# Patient Record
Sex: Male | Born: 1955 | Race: White | Hispanic: No | Marital: Married | Smoking: Never smoker
Health system: Southern US, Community
[De-identification: ages and names within clinical notes are randomized; demographics above are authoritative.]

## PROBLEM LIST (undated history)

## (undated) DIAGNOSIS — I639 Cerebral infarction, unspecified: Secondary | ICD-10-CM

## (undated) DIAGNOSIS — E039 Hypothyroidism, unspecified: Secondary | ICD-10-CM

## (undated) DIAGNOSIS — M1990 Unspecified osteoarthritis, unspecified site: Secondary | ICD-10-CM

## (undated) DIAGNOSIS — G4730 Sleep apnea, unspecified: Secondary | ICD-10-CM

## (undated) DIAGNOSIS — E119 Type 2 diabetes mellitus without complications: Secondary | ICD-10-CM

## (undated) DIAGNOSIS — E079 Disorder of thyroid, unspecified: Secondary | ICD-10-CM

## (undated) DIAGNOSIS — I10 Essential (primary) hypertension: Secondary | ICD-10-CM

## (undated) DIAGNOSIS — F419 Anxiety disorder, unspecified: Secondary | ICD-10-CM

## (undated) DIAGNOSIS — F32A Depression, unspecified: Secondary | ICD-10-CM

## (undated) DIAGNOSIS — R519 Headache, unspecified: Secondary | ICD-10-CM

## (undated) HISTORY — PX: GROIN DEBRIDEMENT: SHX5159

## (undated) HISTORY — PX: HERNIA REPAIR: SHX51

---

## 2013-03-03 ENCOUNTER — Emergency Department (HOSPITAL_COMMUNITY): Payer: BC Managed Care – PPO

## 2013-03-03 ENCOUNTER — Encounter (HOSPITAL_COMMUNITY): Payer: Self-pay | Admitting: Emergency Medicine

## 2013-03-03 ENCOUNTER — Emergency Department (HOSPITAL_COMMUNITY)
Admission: EM | Admit: 2013-03-03 | Discharge: 2013-03-03 | Disposition: A | Discharge status: Home / Self Care | Payer: BC Managed Care – PPO | Attending: Emergency Medicine | Admitting: Emergency Medicine

## 2013-03-03 DIAGNOSIS — R071 Chest pain on breathing: Secondary | ICD-10-CM | POA: Insufficient documentation

## 2013-03-03 DIAGNOSIS — E119 Type 2 diabetes mellitus without complications: Secondary | ICD-10-CM | POA: Insufficient documentation

## 2013-03-03 DIAGNOSIS — R0789 Other chest pain: Secondary | ICD-10-CM

## 2013-03-03 DIAGNOSIS — I10 Essential (primary) hypertension: Secondary | ICD-10-CM | POA: Insufficient documentation

## 2013-03-03 HISTORY — DX: Type 2 diabetes mellitus without complications: E11.9

## 2013-03-03 HISTORY — DX: Essential (primary) hypertension: I10

## 2013-03-03 HISTORY — DX: Disorder of thyroid, unspecified: E07.9

## 2013-03-03 LAB — BASIC METABOLIC PANEL
CO2: 25 mEq/L (ref 19–32)
Calcium: 10.1 mg/dL (ref 8.4–10.5)
Chloride: 97 mEq/L (ref 96–112)
GFR calc Af Amer: 74 mL/min — ABNORMAL LOW (ref >90)
GFR calc non Af Amer: 64 mL/min — ABNORMAL LOW (ref >90)
Glucose, Bld: 138 mg/dL — ABNORMAL HIGH (ref 70–99)
Potassium: 3.6 mEq/L (ref 3.5–5.1)
Sodium: 136 mEq/L (ref 135–145)

## 2013-03-03 LAB — PRO B NATRIURETIC PEPTIDE: Pro B Natriuretic peptide (BNP): 128.2 pg/mL — ABNORMAL HIGH (ref 0–125)

## 2013-03-03 LAB — CBC
MCH: 31.6 pg (ref 26.0–34.0)
MCHC: 35.2 g/dL (ref 30.0–36.0)
Platelets: 255 10*3/uL (ref 150–400)
RBC: 5.06 MIL/uL (ref 4.22–5.81)
RDW: 13.8 % (ref 11.5–15.5)

## 2013-03-03 LAB — POCT I-STAT TROPONIN I: Troponin i, poc: 0 ng/mL (ref 0.00–0.08)

## 2013-03-03 LAB — TROPONIN I: Troponin I: <0.3 ng/mL (ref <0.30)

## 2013-03-03 MED ORDER — LORAZEPAM 2 MG/ML IJ SOLN
1.0000 mg | Freq: Once | INTRAMUSCULAR | Status: AC
  Administered 2013-03-03: 1 mg via INTRAVENOUS
  Filled 2013-03-03: qty 1

## 2013-03-03 MED ORDER — KETOROLAC TROMETHAMINE 30 MG/ML IJ SOLN
30.0000 mg | Freq: Once | INTRAMUSCULAR | Status: AC
  Administered 2013-03-03: 30 mg via INTRAVENOUS
  Filled 2013-03-03: qty 1

## 2013-03-03 MED ORDER — MORPHINE SULFATE 4 MG/ML IJ SOLN
4.0000 mg | Freq: Once | INTRAMUSCULAR | Status: AC
  Administered 2013-03-03: 4 mg via INTRAVENOUS
  Filled 2013-03-03: qty 1

## 2013-03-03 MED ORDER — METHOCARBAMOL 750 MG PO TABS: 750.0000 mg | ORAL_TABLET | Freq: Four times a day (QID) | ORAL | Status: DC

## 2013-03-03 MED ORDER — OXYCODONE-ACETAMINOPHEN 5-325 MG PO TABS: 2.0000 | ORAL_TABLET | ORAL | Status: DC | PRN

## 2013-03-03 MED ORDER — IBUPROFEN 800 MG PO TABS: 800.0000 mg | ORAL_TABLET | Freq: Three times a day (TID) | ORAL | Status: DC

## 2013-03-03 NOTE — ED Notes (Signed)
Troponin results given to RN Diane

## 2013-03-03 NOTE — ED Notes (Signed)
Pt was work this morning about 0600 and started having sharp, crushing CP with radiation to lt shoulder/shoulder blade.  Diaphoretic, pt is crying.

## 2013-03-03 NOTE — ED Provider Notes (Signed)
CSN: [id number]     Arrival date & time 03/03/13  0745 History   First MD Initiated Contact with Patient 03/03/13 0801     Chief Complaint  Patient presents with  . Chest Pain   (Consider location/radiation/quality/duration/timing/severity/associated sxs/prior Treatment) Patient is a 57 y.o. male presenting with chest pain. The history is provided by the patient.  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 Pain characterized as sharp and worse with movement. Patient denies any associated diaphoresis or dyspnea. No history of coronary artery disease. No recent fever or cough. No leg pain or swelling. Took Motrin with some relief. Pain starts at the left side of his chest and radiates up into the left neck and left arm. No numbness or tingling to his arm. No prior history of same. Pain worse with movement better with rest  Past Medical History  Diagnosis Date  . Diabetes mellitus without complication   . Hypertension   . Thyroid disease    Past Surgical History  Procedure Laterality Date  . Hernia repair     History reviewed. No pertinent family history. History  Substance Use Topics  . Smoking status: Never Smoker   . Smokeless tobacco: Not on file  . Alcohol Use: No    Review of Systems  Cardiovascular: Positive for chest pain.  All other systems reviewed and are negative.    Allergies  Review of patient's allergies indicates no known allergies.  Home Medications  No current outpatient prescriptions on file. BP 174/77  Pulse 90  Temp(Src) 97.7 F (36.5 C) (Oral)  Resp 30  SpO2 100% Physical Exam  Nursing note and vitals reviewed. Constitutional: He is oriented to person, place, and time. He appears well-developed and well-nourished.  Non-toxic appearance. No distress.  HENT:  Head: Normocephalic and atraumatic.  Eyes: Conjunctivae, EOM and lids are normal. Pupils are equal, round, and reactive to  light.  Neck: Normal range of motion. Neck supple. No tracheal deviation present. No mass present.  Cardiovascular: Normal rate, regular rhythm and normal heart sounds.  Exam reveals no gallop.   No murmur heard. Pulmonary/Chest: Effort normal and breath sounds normal. No stridor. No respiratory distress. He has no decreased breath sounds. He has no wheezes. He has no rhonchi. He has no rales. He exhibits tenderness and bony tenderness. He exhibits no crepitus.    Abdominal: Soft. Normal appearance and bowel sounds are normal. He exhibits no distension. There is no tenderness. There is no rebound and no CVA tenderness.  Musculoskeletal: Normal range of motion. He exhibits no edema and no tenderness.  Neurological: He is alert and oriented to person, place, and time. He has normal strength. No cranial nerve deficit or sensory deficit. GCS eye subscore is 4. GCS verbal subscore is 5. GCS motor subscore is 6.  Skin: Skin is warm and dry. No abrasion and no rash noted.  Psychiatric: He has a normal mood and affect. His speech is normal and behavior is normal.    ED Course  Procedures (including critical care time) Labs Review Labs Reviewed  CBC  BASIC METABOLIC PANEL  PRO B NATRIURETIC PEPTIDE  TROPONIN I   Imaging Review No results found.  EKG Interpretation    Date/Time:  Tuesday March 03 2013 07:47:58 EST Ventricular Rate:  91 PR Interval:  185 QRS Duration: 105 QT Interval:  373 QTC Calculation: 459 R Axis:   99 Text Interpretation:  Sinus rhythm Probable  left atrial enlargement Borderline right axis deviation Probable anteroseptal infarct, old Baseline wander in lead(s) V3 Confirmed by Michai Dieppa  MD, Makinzey Banes ([id number]) on 03/03/2013 8:12:08 AM            MDM  No diagnosis found. Patient given Toradol, Ativan, morphine and feels better. Patient had exquisite chest wall pain which is improved. No concern for ACS. He is stable for discharge    Toy Baker, MD 03/03/13  [phone]

## 2013-06-13 ENCOUNTER — Encounter (HOSPITAL_COMMUNITY): Payer: Self-pay | Admitting: Emergency Medicine

## 2013-06-13 ENCOUNTER — Emergency Department (HOSPITAL_COMMUNITY)
Admission: EM | Admit: 2013-06-13 | Discharge: 2013-06-13 | Disposition: A | Discharge status: Home / Self Care | Payer: BC Managed Care – PPO | Attending: Emergency Medicine | Admitting: Emergency Medicine

## 2013-06-13 DIAGNOSIS — L03119 Cellulitis of unspecified part of limb: Principal | ICD-10-CM

## 2013-06-13 DIAGNOSIS — E119 Type 2 diabetes mellitus without complications: Secondary | ICD-10-CM | POA: Insufficient documentation

## 2013-06-13 DIAGNOSIS — Z79899 Other long term (current) drug therapy: Secondary | ICD-10-CM | POA: Insufficient documentation

## 2013-06-13 DIAGNOSIS — L03113 Cellulitis of right upper limb: Secondary | ICD-10-CM

## 2013-06-13 DIAGNOSIS — E079 Disorder of thyroid, unspecified: Secondary | ICD-10-CM | POA: Insufficient documentation

## 2013-06-13 DIAGNOSIS — Z7982 Long term (current) use of aspirin: Secondary | ICD-10-CM | POA: Insufficient documentation

## 2013-06-13 DIAGNOSIS — I10 Essential (primary) hypertension: Secondary | ICD-10-CM | POA: Insufficient documentation

## 2013-06-13 DIAGNOSIS — L02519 Cutaneous abscess of unspecified hand: Secondary | ICD-10-CM | POA: Insufficient documentation

## 2013-06-13 MED ORDER — HYDROCODONE-ACETAMINOPHEN 5-325 MG PO TABS
1.0000 | ORAL_TABLET | Freq: Once | ORAL | Status: AC
  Administered 2013-06-13: 1 via ORAL
  Filled 2013-06-13: qty 1

## 2013-06-13 MED ORDER — CLINDAMYCIN HCL 300 MG PO CAPS: 300.0000 mg | ORAL_CAPSULE | Freq: Four times a day (QID) | ORAL | Status: DC

## 2013-06-13 MED ORDER — CLINDAMYCIN HCL 300 MG PO CAPS
300.0000 mg | ORAL_CAPSULE | Freq: Once | ORAL | Status: AC
  Administered 2013-06-13: 300 mg via ORAL
  Filled 2013-06-13: qty 1

## 2013-06-13 NOTE — ED Provider Notes (Signed)
CSN: [id number]     Arrival date & time 06/13/13  2252 History  This chart was scribed for non-physician practitioner Ivonne AndrewPeter Mekenzie Modeste, PA working with Derwood KaplanAnkit Nanavati, MD by Elveria Risingimelie Horne, ED Scribe. This patient was seen in room WTR5/WTR5 and the patient's care was started at 11:30 PM.   Chief Complaint  Patient presents with  . Hand Pain     The history is provided by the patient. No language interpreter was used.   HPI Comments: Joseph Ortega is a 58 y.o. male with history of HTN and diabetes who presents to the Emergency Department complaining of throbbing right hand pain, onset one day. Patient reports that yesterday he woke up around 4pm and his right fifth finger and hand was red and swollen. Tonight patient reports that his pain has worsened and it extends up the arm into the elbow and shoulder. Patient also reports weakness in the fifth finger and is unable to straighten it. He does have a prior history of fracture to the finger which resulted in reduced extension however today it is worsened. Patient denies causative trauma or injury. Patient denies history of gout or joint inflammatory disease. Denies any fever, chills or sweats.  Patient is taking insulin for diabetes and checks his sugar regularly.   Past Medical History  Diagnosis Date  . Diabetes mellitus without complication   . Hypertension   . Thyroid disease    Past Surgical History  Procedure Laterality Date  . Hernia repair     No family history on file. History  Substance Use Topics  . Smoking status: Never Smoker   . Smokeless tobacco: Not on file  . Alcohol Use: No    Review of Systems  Constitutional: Negative for fever, chills and diaphoresis.      Allergies  Review of patient's allergies indicates no known allergies.  Home Medications   Current Outpatient Rx  Name  Route  Sig  Dispense  Refill  . amLODipine (NORVASC) 10 MG tablet   Oral   Take 10 mg by mouth daily.         Marland Kitchen. aspirin 81 MG  tablet   Oral   Take 81 mg by mouth daily.         . Exenatide (BYDUREON )   Subcutaneous   Inject 1 each into the skin once a week. Sunday.Info from Pharmacy: Bydureon use weekly as directed.         Marland Kitchen. glipiZIDE (GLUCOTROL XL) 10 MG 24 hr tablet   Oral   Take 10 mg by mouth daily with breakfast.         . ibuprofen (ADVIL,MOTRIN) 800 MG tablet   Oral   Take 1 tablet (800 mg total) by mouth 3 (three) times daily.   21 tablet   0   . insulin detemir (LEVEMIR) 100 UNIT/ML injection   Subcutaneous   Inject 54 Units into the skin daily.         Marland Kitchen. levothyroxine (SYNTHROID, LEVOTHROID) 125 MCG tablet   Oral   Take 125 mcg by mouth daily before breakfast.         . metFORMIN (GLUCOPHAGE) 500 MG tablet   Oral   Take 500 mg by mouth 2 (two) times daily with a meal. Note that 1000 mg bid is prescribed         . valsartan (DIOVAN) 320 MG tablet   Oral   Take 320 mg by mouth daily.         .Marland Kitchen  Vitamin D, Ergocalciferol, (DRISDOL) 50000 UNITS CAPS capsule   Oral   Take 50,000 Units by mouth every 7 (seven) days. sunday          Triage Vitals: BP 161/72  Pulse 77  Temp(Src) 98.6 F (37 C)  Resp 20  SpO2 97% Physical Exam  Nursing note and vitals reviewed. Constitutional: He is oriented to person, place, and time. He appears well-developed and well-nourished. No distress.  HENT:  Head: Normocephalic and atraumatic.  Eyes: EOM are normal.  Neck: Neck supple. No tracheal deviation present.  Cardiovascular: Normal rate.   Pulmonary/Chest: Effort normal. No respiratory distress.  Musculoskeletal: Normal range of motion.  There is diffuse erythema and swelling around the dorsal right hand especially the fifth MCP joint area. Small scab from abrasion or injury to the skin over the joint. There is no significant induration, nodularity or fluctuance. Reduced extension of the fifth finger secondary to pain and swelling. Normal distal sensations and capillary  refill.  There is no complete swelling of the fifth finger or sausage appearance. No significant pain with passive flexion or extension.  Neurological: He is alert and oriented to person, place, and time.  Skin: Skin is warm and dry.  Psychiatric: He has a normal mood and affect. His behavior is normal.    ED Course  Procedures  DIAGNOSTIC STUDIES: Oxygen Saturation is 97% on room air, adequate by my interpretation.    COORDINATION OF CARE: 11:37 PM- patient seen and evaluated. Patient appears well in no acute distress. There is a slight superficial small scab over the right fifth MCP joint. No nodularity or fluctuance. Diffuse erythema. Slightly reduced extension of the fifth finger from baseline. Patient with previous fractures to the finger. Pt advised of plan for treatment and pt agrees.  At this time do not suspect tenosynovitis. Will give prescription for clindamycin and orthopedic hand referral. Patient agrees with plan. Strict return precautions given.  MDM   Final diagnoses:  Cellulitis of right hand    I personally performed the services described in this documentation, which was scribed in my presence. The recorded information has been reviewed and is accurate.      Angus Seller, PA-C 06/13/13 [phone]

## 2013-06-13 NOTE — Discharge Instructions (Signed)
Keep your hand elevated to reduce pain and swelling. Take the antibiotics as prescribed and followup with a primary care provider or orthopedic hand specialist in 2 days. Return to the emergency department at any time for changing or worsening symptoms.    Cellulitis Cellulitis is an infection of the skin and the tissue beneath it. The infected area is usually red and tender. Cellulitis occurs most often in the arms and lower legs.  CAUSES  Cellulitis is caused by bacteria that enter the skin through cracks or cuts in the skin. The most common types of bacteria that cause cellulitis are Staphylococcus and Streptococcus. SYMPTOMS   Redness and warmth.  Swelling.  Tenderness or pain.  Fever. DIAGNOSIS  Your caregiver can usually determine what is wrong based on a physical exam. Blood tests may also be done. TREATMENT  Treatment usually involves taking an antibiotic medicine. HOME CARE INSTRUCTIONS   Take your antibiotics as directed. Finish them even if you start to feel better.  Keep the infected arm or leg elevated to reduce swelling.  Apply a warm cloth to the affected area up to 4 times per day to relieve pain.  Only take over-the-counter or prescription medicines for pain, discomfort, or fever as directed by your caregiver.  Keep all follow-up appointments as directed by your caregiver. SEEK MEDICAL CARE IF:   You notice red streaks coming from the infected area.  Your red area gets larger or turns dark in color.  Your bone or joint underneath the infected area becomes painful after the skin has healed.  Your infection returns in the same area or another area.  You notice a swollen bump in the infected area.  You develop new symptoms. SEEK IMMEDIATE MEDICAL CARE IF:   You have a fever.  You feel very sleepy.  You develop vomiting or diarrhea.  You have a general ill feeling (malaise) with muscle aches and pains. MAKE SURE YOU:   Understand these  instructions.  Will watch your condition.  Will get help right away if you are not doing well or get worse. Document Released: 12/13/2004 Document Revised: 09/04/2011 Document Reviewed: 05/21/2011 [facility] Patient Information 2014 [employer], [place].

## 2013-06-13 NOTE — ED Notes (Signed)
Pt states that yesterday evening he noticed a small area that looked as if something bite him. States hand all the way up to arm hurts. Pt has small area that is red on right pinky area.

## 2013-06-14 ENCOUNTER — Emergency Department (HOSPITAL_COMMUNITY)
Admission: EM | Admit: 2013-06-14 | Discharge: 2013-06-14 | Disposition: A | Discharge status: Home / Self Care | Payer: BC Managed Care – PPO | Attending: Emergency Medicine | Admitting: Emergency Medicine

## 2013-06-14 ENCOUNTER — Encounter (HOSPITAL_COMMUNITY): Payer: Self-pay | Admitting: Emergency Medicine

## 2013-06-14 DIAGNOSIS — L02519 Cutaneous abscess of unspecified hand: Secondary | ICD-10-CM | POA: Insufficient documentation

## 2013-06-14 DIAGNOSIS — I10 Essential (primary) hypertension: Secondary | ICD-10-CM | POA: Insufficient documentation

## 2013-06-14 DIAGNOSIS — L03019 Cellulitis of unspecified finger: Secondary | ICD-10-CM | POA: Insufficient documentation

## 2013-06-14 DIAGNOSIS — Z7982 Long term (current) use of aspirin: Secondary | ICD-10-CM | POA: Insufficient documentation

## 2013-06-14 DIAGNOSIS — E119 Type 2 diabetes mellitus without complications: Secondary | ICD-10-CM | POA: Insufficient documentation

## 2013-06-14 DIAGNOSIS — Z794 Long term (current) use of insulin: Secondary | ICD-10-CM | POA: Insufficient documentation

## 2013-06-14 DIAGNOSIS — E079 Disorder of thyroid, unspecified: Secondary | ICD-10-CM | POA: Insufficient documentation

## 2013-06-14 DIAGNOSIS — Z791 Long term (current) use of non-steroidal anti-inflammatories (NSAID): Secondary | ICD-10-CM | POA: Insufficient documentation

## 2013-06-14 DIAGNOSIS — Z79899 Other long term (current) drug therapy: Secondary | ICD-10-CM | POA: Insufficient documentation

## 2013-06-14 LAB — CBC WITH DIFFERENTIAL/PLATELET
BASOS PCT: 0 % (ref 0–1)
Basophils Absolute: 0 10*3/uL (ref 0.0–0.1)
Eosinophils Absolute: 0.1 10*3/uL (ref 0.0–0.7)
Eosinophils Relative: 2 % (ref 0–5)
HEMATOCRIT: 41.1 % (ref 39.0–52.0)
HEMOGLOBIN: 14.1 g/dL (ref 13.0–17.0)
LYMPHS ABS: 1.6 10*3/uL (ref 0.7–4.0)
Lymphocytes Relative: 28 % (ref 12–46)
MCH: 30.9 pg (ref 26.0–34.0)
MCHC: 34.3 g/dL (ref 30.0–36.0)
MCV: 89.9 fL (ref 78.0–100.0)
MONO ABS: 0.5 10*3/uL (ref 0.1–1.0)
MONOS PCT: 8 % (ref 3–12)
NEUTROS ABS: 3.6 10*3/uL (ref 1.7–7.7)
Neutrophils Relative %: 62 % (ref 43–77)
Platelets: 182 10*3/uL (ref 150–400)
RBC: 4.57 MIL/uL (ref 4.22–5.81)
RDW: 14 % (ref 11.5–15.5)
WBC: 5.8 10*3/uL (ref 4.0–10.5)

## 2013-06-14 LAB — URIC ACID: Uric Acid, Serum: 5.4 mg/dL (ref 4.0–7.8)

## 2013-06-14 LAB — BASIC METABOLIC PANEL
BUN: 19 mg/dL (ref 6–23)
CHLORIDE: 102 meq/L (ref 96–112)
CO2: 29 meq/L (ref 19–32)
Calcium: 9.5 mg/dL (ref 8.4–10.5)
Creatinine, Ser: 0.82 mg/dL (ref 0.50–1.35)
GFR calc non Af Amer: >90 mL/min (ref >90)
Glucose, Bld: 195 mg/dL — ABNORMAL HIGH (ref 70–99)
POTASSIUM: 4.2 meq/L (ref 3.7–5.3)
Sodium: 140 mEq/L (ref 137–147)

## 2013-06-14 NOTE — ED Provider Notes (Signed)
CSN: [id number]     Arrival date & time 06/14/13  1122 History   First MD Initiated Contact with Patient 06/14/13 1132     Chief Complaint  Patient presents with  . Labs Only     (Consider location/radiation/quality/duration/timing/severity/associated sxs/prior Treatment) HPI  Patient presents to the emergency department as requested by Dr. Caprice Red MD for a lab draw. Patient was seen last night with cellulitis to the right fifth digit. It was noted that he had some swelling, erythema and slight decrease of extension of the finger. He was written for a prescription of clindamycin and told to follow back with the hand doctor on Monday. He is taken at least 4 doses of the clindamycin and denies his hand swelling being any worse.  Past Medical History  Diagnosis Date  . Diabetes mellitus without complication   . Hypertension   . Thyroid disease    Past Surgical History  Procedure Laterality Date  . Hernia repair     History reviewed. No pertinent family history. History  Substance Use Topics  . Smoking status: Never Smoker   . Smokeless tobacco: Not on file  . Alcohol Use: No    Review of Systems  The patient denies anorexia, fever, weight loss,, vision loss, decreased hearing, hoarseness, chest pain, syncope, dyspnea on exertion, peripheral edema, balance deficits, hemoptysis, abdominal pain, melena, hematochezia, severe indigestion/heartburn, hematuria, incontinence, genital sores, muscle weakness, suspicious skin lesions, transient blindness, difficulty walking, depression, unusual weight change, abnormal bleeding, enlarged lymph nodes, angioedema, and breast masses.   Allergies  Review of patient's allergies indicates no known allergies.  Home Medications   Current Outpatient Rx  Name  Route  Sig  Dispense  Refill  . amLODipine (NORVASC) 10 MG tablet   Oral   Take 10 mg by mouth daily.         Marland Kitchen aspirin 81 MG tablet   Oral   Take 81 mg by mouth daily.          . clindamycin (CLEOCIN) 300 MG capsule   Oral   Take 1 capsule (300 mg total) by mouth 4 (four) times daily. X 7 days   28 capsule   0   . Exenatide (BYDUREON [place])   Subcutaneous   Inject 1 each into the skin once a week. Sunday.Info from Pharmacy: Bydureon use weekly as directed.         Marland Kitchen glipiZIDE (GLUCOTROL XL) 10 MG 24 hr tablet   Oral   Take 10 mg by mouth daily with breakfast.         . ibuprofen (ADVIL,MOTRIN) 800 MG tablet   Oral   Take 1 tablet (800 mg total) by mouth 3 (three) times daily.   21 tablet   0   . insulin detemir (LEVEMIR) 100 UNIT/ML injection   Subcutaneous   Inject 54 Units into the skin daily.         Marland Kitchen levothyroxine (SYNTHROID, LEVOTHROID) 125 MCG tablet   Oral   Take 125 mcg by mouth daily before breakfast.         . metFORMIN (GLUCOPHAGE) 500 MG tablet   Oral   Take 500 mg by mouth 2 (two) times daily with a meal. Note that 1000 mg bid is prescribed         . valsartan (DIOVAN) 320 MG tablet   Oral   Take 320 mg by mouth daily.         . Vitamin D, Ergocalciferol, (DRISDOL) 50000 UNITS CAPS  capsule   Oral   Take 50,000 Units by mouth every 7 (seven) days. sunday          BP 172/92  Pulse 72  Temp(Src) 97.9 F (36.6 C)  Resp 18  SpO2 100% Physical Exam  Nursing note and vitals reviewed. Constitutional: He appears well-developed and well-nourished. No distress.  HENT:  Head: Normocephalic and atraumatic.  Eyes: Pupils are equal, round, and reactive to light.  Neck: Normal range of motion. Neck supple.  Cardiovascular: Normal rate and regular rhythm.   Pulmonary/Chest: Effort normal.  Abdominal: Soft.  Musculoskeletal:  Patient has mild swelling to the MCP joints with a small amount of cellulitis surrounding a small scab. His finger is held in slight flexion and and he is unable to extend it with effort. He does not have pain with extension of the finger of passive range of motion. He does have pain to pressure distal  to the wrist and into the forearm without streaking.  Neurological: He is alert.  Skin: Skin is warm and dry.    ED Course  Procedures (including critical care time) Labs Review Labs Reviewed  BASIC METABOLIC PANEL - Abnormal; Notable for the following:    Glucose, Bld 195 (*)    All other components within normal limits  CBC WITH DIFFERENTIAL  URIC ACID   Imaging Review No results found.   EKG Interpretation None      MDM   Final diagnoses:  Cellulitis of finger    Uric acid and cbc, bmp ordered as recommended by Dr. Caprice RedWiengold. Will call with blood results  Discussed results with Dr. Mina MarbleWeingold- pts labs are WNL, he does not appear to have tenosynovitis at this time. Dr. Mina MarbleWeingold would like patient to see him in the office tomorrow. Pt has been instructed to call in the morning to arrange follow-up  Continue clindamycin  57 y.o.Joseph Ortega's evaluation in the Emergency Department is complete. It has been determined that no acute conditions requiring further emergency intervention are present at this time. The patient/guardian have been advised of the diagnosis and plan. We have discussed signs and symptoms that warrant return to the ED, such as changes or worsening in symptoms.  Vital signs are stable at discharge. Filed Vitals:   06/14/13 1157  BP: 172/92  Pulse: 72  Temp: 97.9 F (36.6 C)  Resp: 18    Patient/guardian has voiced understanding and agreed to follow-up with the PCP or specialist.   Dorthula Matasiffany G Jayshaun Phillips, PA-C 06/14/13 1252

## 2013-06-14 NOTE — ED Provider Notes (Signed)
Medical screening examination/treatment/procedure(s) were performed by non-physician practitioner and as supervising physician I was immediately available for consultation/collaboration.  Toy BakerAnthony T Nicolle Heward, MD 06/14/13 [phone]

## 2013-06-14 NOTE — Discharge Instructions (Signed)
Cellulitis Cellulitis is an infection of the skin and the tissue beneath it. The infected area is usually red and tender. Cellulitis occurs most often in the arms and lower legs.  CAUSES  Cellulitis is caused by bacteria that enter the skin through cracks or cuts in the skin. The most common types of bacteria that cause cellulitis are Staphylococcus and Streptococcus. SYMPTOMS   Redness and warmth.  Swelling.  Tenderness or pain.  Fever. DIAGNOSIS  Your caregiver can usually determine what is wrong based on a physical exam. Blood tests may also be done. TREATMENT  Treatment usually involves taking an antibiotic medicine. HOME CARE INSTRUCTIONS   Take your antibiotics as directed. Finish them even if you start to feel better.  Keep the infected arm or leg elevated to reduce swelling.  Apply a warm cloth to the affected area up to 4 times per day to relieve pain.  Only take over-the-counter or prescription medicines for pain, discomfort, or fever as directed by your caregiver.  Keep all follow-up appointments as directed by your caregiver. SEEK MEDICAL CARE IF:   You notice red streaks coming from the infected area.  Your red area gets larger or turns dark in color.  Your bone or joint underneath the infected area becomes painful after the skin has healed.  Your infection returns in the same area or another area.  You notice a swollen bump in the infected area.  You develop new symptoms. SEEK IMMEDIATE MEDICAL CARE IF:   You have a fever.  You feel very sleepy.  You develop vomiting or diarrhea.  You have a general ill feeling (malaise) with muscle aches and pains. MAKE SURE YOU:   Understand these instructions.  Will watch your condition.  Will get help right away if you are not doing well or get worse. Document Released: 12/13/2004 Document Revised: 09/04/2011 Document Reviewed: 05/21/2011 [employer] Patient Information 2014 [employer].  

## 2013-06-14 NOTE — ED Provider Notes (Signed)
Medical screening examination/treatment/procedure(s) were performed by non-physician practitioner and as supervising physician I was immediately available for consultation/collaboration.   EKG Interpretation None       Derwood KaplanAnkit Yoltzin Ransom, MD 06/14/13 [phone]

## 2013-06-14 NOTE — ED Notes (Signed)
Notified pt to return for additional blood work at a request of Dr Caprice RedWiengold. Pt in agreement and is returning.

## 2013-08-05 ENCOUNTER — Other Ambulatory Visit: Payer: Self-pay | Admitting: Occupational Medicine

## 2013-08-05 ENCOUNTER — Ambulatory Visit: Payer: Self-pay

## 2013-08-05 DIAGNOSIS — Z0000 Encounter for general adult medical examination without abnormal findings: Secondary | ICD-10-CM

## 2016-01-02 ENCOUNTER — Other Ambulatory Visit: Payer: Self-pay | Admitting: Occupational Medicine

## 2016-01-02 ENCOUNTER — Ambulatory Visit: Payer: Self-pay

## 2016-01-02 DIAGNOSIS — Z0000 Encounter for general adult medical examination without abnormal findings: Secondary | ICD-10-CM

## 2017-07-15 ENCOUNTER — Ambulatory Visit
Admission: RE | Admit: 2017-07-15 | Discharge: 2017-07-15 | Disposition: A | Discharge status: Home / Self Care | Payer: Self-pay | Source: Ambulatory Visit | Attending: Pain Medicine | Admitting: Pain Medicine

## 2017-07-15 ENCOUNTER — Other Ambulatory Visit: Payer: Self-pay | Admitting: Pain Medicine

## 2017-07-15 DIAGNOSIS — M542 Cervicalgia: Secondary | ICD-10-CM

## 2017-08-26 ENCOUNTER — Other Ambulatory Visit: Payer: Self-pay | Admitting: Pain Medicine

## 2017-08-26 DIAGNOSIS — M25562 Pain in left knee: Secondary | ICD-10-CM

## 2017-08-26 DIAGNOSIS — M25519 Pain in unspecified shoulder: Secondary | ICD-10-CM

## 2017-08-26 DIAGNOSIS — M542 Cervicalgia: Secondary | ICD-10-CM

## 2017-08-30 ENCOUNTER — Ambulatory Visit
Admission: RE | Admit: 2017-08-30 | Discharge: 2017-08-30 | Disposition: A | Discharge status: Home / Self Care | Payer: BLUE CROSS/BLUE SHIELD | Source: Ambulatory Visit | Attending: Pain Medicine | Admitting: Pain Medicine

## 2017-08-30 DIAGNOSIS — M25519 Pain in unspecified shoulder: Secondary | ICD-10-CM

## 2017-08-30 DIAGNOSIS — M25562 Pain in left knee: Secondary | ICD-10-CM

## 2017-08-30 DIAGNOSIS — M542 Cervicalgia: Secondary | ICD-10-CM

## 2018-09-30 ENCOUNTER — Telehealth: Payer: Self-pay | Admitting: *Deleted

## 2018-09-30 DIAGNOSIS — Z20822 Contact with and (suspected) exposure to covid-19: Secondary | ICD-10-CM

## 2018-09-30 NOTE — Addendum Note (Signed)
Addended by: Linus Orn A on: 09/30/2018 03:34 PM   Modules accepted: Orders

## 2018-09-30 NOTE — Telephone Encounter (Signed)
Pt. Called back and scheduled for tomorrow. 

## 2018-09-30 NOTE — Telephone Encounter (Signed)
Pt referred for testing by Charletta Cousin. Reason for testing: Exposure Insurance: [employer]  Pt called but no answer at this time. Left message on voicemail to return call to schedule testing.

## 2018-10-01 ENCOUNTER — Other Ambulatory Visit: Payer: Self-pay | Admitting: *Deleted

## 2018-10-01 ENCOUNTER — Other Ambulatory Visit: Payer: Self-pay

## 2018-10-01 DIAGNOSIS — Z20822 Contact with and (suspected) exposure to covid-19: Secondary | ICD-10-CM

## 2018-10-05 LAB — NOVEL CORONAVIRUS, NAA: SARS-CoV-2, NAA: NOT DETECTED

## 2019-02-25 ENCOUNTER — Emergency Department (HOSPITAL_COMMUNITY): Payer: BC Managed Care – PPO

## 2019-02-25 ENCOUNTER — Other Ambulatory Visit: Payer: Self-pay

## 2019-02-25 ENCOUNTER — Encounter (HOSPITAL_COMMUNITY): Payer: Self-pay | Admitting: Emergency Medicine

## 2019-02-25 ENCOUNTER — Emergency Department (HOSPITAL_COMMUNITY)
Admission: EM | Admit: 2019-02-25 | Discharge: 2019-02-25 | Disposition: A | Discharge status: Home / Self Care | Payer: BC Managed Care – PPO | Attending: Emergency Medicine | Admitting: Emergency Medicine

## 2019-02-25 DIAGNOSIS — Z7982 Long term (current) use of aspirin: Secondary | ICD-10-CM | POA: Insufficient documentation

## 2019-02-25 DIAGNOSIS — S92354A Nondisplaced fracture of fifth metatarsal bone, right foot, initial encounter for closed fracture: Secondary | ICD-10-CM | POA: Diagnosis not present

## 2019-02-25 DIAGNOSIS — Y9389 Activity, other specified: Secondary | ICD-10-CM | POA: Insufficient documentation

## 2019-02-25 DIAGNOSIS — E119 Type 2 diabetes mellitus without complications: Secondary | ICD-10-CM | POA: Diagnosis not present

## 2019-02-25 DIAGNOSIS — I10 Essential (primary) hypertension: Secondary | ICD-10-CM | POA: Insufficient documentation

## 2019-02-25 DIAGNOSIS — Z794 Long term (current) use of insulin: Secondary | ICD-10-CM | POA: Insufficient documentation

## 2019-02-25 DIAGNOSIS — Y990 Civilian activity done for income or pay: Secondary | ICD-10-CM | POA: Insufficient documentation

## 2019-02-25 DIAGNOSIS — Y929 Unspecified place or not applicable: Secondary | ICD-10-CM | POA: Insufficient documentation

## 2019-02-25 DIAGNOSIS — Z79899 Other long term (current) drug therapy: Secondary | ICD-10-CM | POA: Insufficient documentation

## 2019-02-25 DIAGNOSIS — S99921A Unspecified injury of right foot, initial encounter: Secondary | ICD-10-CM | POA: Diagnosis present

## 2019-02-25 DIAGNOSIS — X501XXA Overexertion from prolonged static or awkward postures, initial encounter: Secondary | ICD-10-CM | POA: Insufficient documentation

## 2019-02-25 MED ORDER — OXYCODONE-ACETAMINOPHEN 5-325 MG PO TABS
1.0000 | ORAL_TABLET | Freq: Once | ORAL | Status: AC
  Administered 2019-02-25: 1 via ORAL
  Filled 2019-02-25: qty 1

## 2019-02-25 NOTE — Discharge Instructions (Signed)
Please read instructions below. Keep the splint clean, dry and in place at all times. Elevate your leg as much as possible. You can take your prescribed pain medications as directed for pain. Call the orthopedic specialist office the next business day to schedule an appointment for repeat xray and follow up on your injury. Return to the ED for concerning symptoms.

## 2019-02-25 NOTE — Progress Notes (Signed)
Orthopedic Tech Progress Note Patient Details:  Joseph Ortega Nov 17, 1955 [id number]  Ortho Devices Type of Ortho Device: Ace wrap, Stirrup splint, Short leg splint Ortho Device/Splint Location: right Ortho Device/Splint Interventions: Application   Post Interventions Patient Tolerated: Well Instructions Provided: Care of device   [place] Pink 02/25/2019, 7:39 PM

## 2019-02-25 NOTE — ED Triage Notes (Signed)
C/o R foot pain that started around noon today.  States he was coming down steps at work and stepped on concrete floor and heard a pop.

## 2019-02-25 NOTE — ED Provider Notes (Signed)
[facility] EMERGENCY DEPARTMENT Provider Note   CSN: [id number] Arrival date & time: 02/25/19  1557     History   Chief Complaint Chief Complaint  Patient presents with  . Foot Pain    HPI Joseph Ortega is a 63 y.o. male with past medical history of DM, hypertension, presenting the emergency department with sudden onset of right foot pain after injury today.  Patient states he was walking on the steps and when he stepped down off the last step he pivoted and heard a "pop" in his foot.  He has pain to the lateral aspect of his foot and ankle that wraps to the plantar aspect of his foot.  Pain is worse with weightbearing and movement.  He is prescribed chronic pain medication which he is taking as prescribed.  No previous injuries to the foot.  No other injuries reported.     The history is provided by the patient.    Past Medical History:  Diagnosis Date  . Diabetes mellitus without complication ([place])   . Hypertension   . Thyroid disease     There are no active problems to display for this patient.   Past Surgical History:  Procedure Laterality Date  . HERNIA REPAIR          Home Medications    Prior to Admission medications   Medication Sig Start Date End Date Taking? Authorizing Provider  amLODipine (NORVASC) 10 MG tablet Take 10 mg by mouth daily.    [provider]  aspirin 81 MG tablet Take 81 mg by mouth daily.    [provider]  clindamycin (CLEOCIN) 300 MG capsule Take 1 capsule (300 mg total) by mouth 4 (four) times daily. X 7 days 06/13/13   Hazel Sams, PA-C  Exenatide (BYDUREON [place]) Inject 1 each into the skin once a week. Sunday.Info from Pharmacy: Bydureon use weekly as directed.    [provider]  glipiZIDE (GLUCOTROL XL) 10 MG 24 hr tablet Take 10 mg by mouth daily with breakfast.    [provider]  ibuprofen (ADVIL,MOTRIN) 800 MG tablet Take 1 tablet (800 mg total) by mouth 3 (three) times  daily. 03/03/13   Lacretia Leigh, MD  insulin detemir (LEVEMIR) 100 UNIT/ML injection Inject 54 Units into the skin daily.    [provider]  levothyroxine (SYNTHROID, LEVOTHROID) 125 MCG tablet Take 125 mcg by mouth daily before breakfast.    [provider]  metFORMIN (GLUCOPHAGE) 500 MG tablet Take 500 mg by mouth 2 (two) times daily with a meal. Note that 1000 mg bid is prescribed    [provider]  valsartan (DIOVAN) 320 MG tablet Take 320 mg by mouth daily.    [provider]  Vitamin D, Ergocalciferol, (DRISDOL) 50000 UNITS CAPS capsule Take 50,000 Units by mouth every 7 (seven) days. sunday    [provider]    Family History No family history on file.  Social History Social History   Tobacco Use  . Smoking status: Never Smoker  . Smokeless tobacco: Never Used  Substance Use Topics  . Alcohol use: No  . Drug use: No     Allergies   Patient has no known allergies.   Review of Systems Review of Systems  Musculoskeletal: Positive for arthralgias and myalgias.  Skin: Negative for wound.     Physical Exam Updated Vital Signs BP (!) 177/80 (BP Location: Left Arm)   Pulse 77   Temp 98.7 F (37.1 C) (  Oral)   Resp 16   SpO2 97%   Physical Exam Vitals signs and nursing note reviewed.  Constitutional:      Appearance: He is well-developed.  HENT:     Head: Normocephalic and atraumatic.  Eyes:     Conjunctiva/sclera: Conjunctivae normal.  Cardiovascular:     Rate and Rhythm: Normal rate.  Pulmonary:     Effort: Pulmonary effort is normal.  Musculoskeletal:     Comments: B/l pedal and LE edema. Right foot w TTP over the lateral metatarsals and just inferior to the lateral malleolus. Normal ROM however active dorsiflexion causes pain. Small bruise to the middle of the plantar aspect of the foot. No wounds.  Neurological:     Mental Status: He is alert.  Psychiatric:        Mood and Affect: Mood normal.         Behavior: Behavior normal.      ED Treatments / Results  Labs (all labs ordered are listed, but only abnormal results are displayed) Labs Reviewed - No data to display  EKG None  Radiology Dg Ankle Complete Right  Result Date: 02/25/2019 CLINICAL DATA:  Foot pain.  Injury EXAM: RIGHT ANKLE - COMPLETE 3+ VIEW COMPARISON:  None. FINDINGS: Nondisplaced fracture proximal fifth metatarsal. Negative for ankle fracture.  Normal ankle joint space. Arterial calcification IMPRESSION: Nondisplaced fracture proximal fifth metatarsal. Electronically Signed   By: Marlan Palau M.D.   On: 02/25/2019 17:51   Dg Foot Complete Right  Result Date: 02/25/2019 CLINICAL DATA:  Foot pain, injury EXAM: RIGHT FOOT COMPLETE - 3+ VIEW COMPARISON:  None. FINDINGS: Transverse fracture of the proximal fifth metatarsal without displacement. Chronic fracture of the mid second metatarsal. No erosion. Arterial calcification IMPRESSION: Nondisplaced fracture proximal fifth metatarsal. Electronically Signed   By: Marlan Palau M.D.   On: 02/25/2019 17:49    Procedures Procedures (including critical care time)  Medications Ordered in ED Medications  oxyCODONE-acetaminophen (PERCOCET/ROXICET) 5-325 MG per tablet 1 tablet (has no administration in time range)     Initial Impression / Assessment and Plan / ED Course  I have reviewed the triage vital signs and the nursing notes.  Pertinent labs & imaging results that were available during my care of the patient were reviewed by me and considered in my medical decision making (see chart for details).        Pt with acute closed nondisplaced jones fracture after stepping off a step and pivoting wrong. NV intact. No acute distress. Pain treated. Placed in short leg splint, crutches, and provided ortho follow up. Pt prescribed tramadol, gabapentin and meloxicam for chronic pain. Encouraged he continue taking as directed to symptoms management. Safe for discharge.   Discussed results, findings, treatment and follow up. Patient advised of return precautions. Patient verbalized understanding and agreed with plan.  Final Clinical Impressions(s) / ED Diagnoses   Final diagnoses:  Closed nondisplaced fracture of fifth metatarsal bone of right foot, initial encounter    ED Discharge Orders    None       Robinson, [place] N, PA-C 02/25/19 1820    Virgina Norfolk, DO 02/25/19 1929

## 2019-02-25 NOTE — ED Notes (Signed)
Pt discharge instructions reviewed with patient. Pt verbalized understanding. Pt discharged. 

## 2019-10-13 ENCOUNTER — Encounter (HOSPITAL_COMMUNITY): Payer: Self-pay | Admitting: Emergency Medicine

## 2019-10-13 ENCOUNTER — Emergency Department (HOSPITAL_COMMUNITY): Payer: BC Managed Care – PPO

## 2019-10-13 ENCOUNTER — Other Ambulatory Visit: Payer: Self-pay

## 2019-10-13 ENCOUNTER — Emergency Department (HOSPITAL_COMMUNITY)
Admission: EM | Admit: 2019-10-13 | Discharge: 2019-10-14 | Disposition: A | Discharge status: Home / Self Care | Payer: BC Managed Care – PPO | Attending: Emergency Medicine | Admitting: Emergency Medicine

## 2019-10-13 DIAGNOSIS — I10 Essential (primary) hypertension: Secondary | ICD-10-CM | POA: Diagnosis not present

## 2019-10-13 DIAGNOSIS — U071 COVID-19: Secondary | ICD-10-CM | POA: Insufficient documentation

## 2019-10-13 DIAGNOSIS — E119 Type 2 diabetes mellitus without complications: Secondary | ICD-10-CM | POA: Diagnosis not present

## 2019-10-13 DIAGNOSIS — R509 Fever, unspecified: Secondary | ICD-10-CM | POA: Diagnosis present

## 2019-10-13 NOTE — ED Triage Notes (Signed)
Pt arrives to ED with c/o of testing covid + on 7/15 and now having  Sob. Pt also reports fever and night sweats.

## 2019-10-14 LAB — CBC WITH DIFFERENTIAL/PLATELET
Abs Immature Granulocytes: 0 10*3/uL (ref 0.00–0.07)
Basophils Absolute: 0 10*3/uL (ref 0.0–0.1)
Basophils Relative: 0 %
Eosinophils Absolute: 0 10*3/uL (ref 0.0–0.5)
Eosinophils Relative: 0 %
HCT: 41.6 % (ref 39.0–52.0)
Hemoglobin: 13.8 g/dL (ref 13.0–17.0)
Lymphocytes Relative: 17 %
Lymphs Abs: 0.6 10*3/uL — ABNORMAL LOW (ref 0.7–4.0)
MCH: 29.9 pg (ref 26.0–34.0)
MCHC: 33.2 g/dL (ref 30.0–36.0)
MCV: 90 fL (ref 80.0–100.0)
Monocytes Absolute: 0 10*3/uL — ABNORMAL LOW (ref 0.1–1.0)
Monocytes Relative: 1 %
Neutro Abs: 3 10*3/uL (ref 1.7–7.7)
Neutrophils Relative %: 82 %
Platelets: 127 10*3/uL — ABNORMAL LOW (ref 150–400)
RBC: 4.62 MIL/uL (ref 4.22–5.81)
RDW: 13.4 % (ref 11.5–15.5)
WBC: 3.7 10*3/uL — ABNORMAL LOW (ref 4.0–10.5)
nRBC: 0 % (ref 0.0–0.2)
nRBC: 0 /100 WBC

## 2019-10-14 LAB — COMPREHENSIVE METABOLIC PANEL
ALT: 15 U/L (ref 0–44)
AST: 24 U/L (ref 15–41)
Albumin: 2.8 g/dL — ABNORMAL LOW (ref 3.5–5.0)
Alkaline Phosphatase: 74 U/L (ref 38–126)
Anion gap: 13 (ref 5–15)
BUN: 14 mg/dL (ref 8–23)
CO2: 25 mmol/L (ref 22–32)
Calcium: 8.6 mg/dL — ABNORMAL LOW (ref 8.9–10.3)
Chloride: 96 mmol/L — ABNORMAL LOW (ref 98–111)
Creatinine, Ser: 1.01 mg/dL (ref 0.61–1.24)
GFR calc Af Amer: >60 mL/min (ref >60)
GFR calc non Af Amer: >60 mL/min (ref >60)
Glucose, Bld: 334 mg/dL — ABNORMAL HIGH (ref 70–99)
Potassium: 3.4 mmol/L — ABNORMAL LOW (ref 3.5–5.1)
Sodium: 134 mmol/L — ABNORMAL LOW (ref 135–145)
Total Bilirubin: 1.2 mg/dL (ref 0.3–1.2)
Total Protein: 6.8 g/dL (ref 6.5–8.1)

## 2019-10-14 MED ORDER — DEXAMETHASONE 4 MG PO TABS
10.0000 mg | ORAL_TABLET | Freq: Once | ORAL | Status: AC
  Administered 2019-10-14: 10 mg via ORAL
  Filled 2019-10-14: qty 3

## 2019-10-14 MED ORDER — ALBUTEROL SULFATE HFA 108 (90 BASE) MCG/ACT IN AERS
8.0000 | INHALATION_SPRAY | RESPIRATORY_TRACT | Status: DC | PRN
  Administered 2019-10-14: 8 via RESPIRATORY_TRACT
  Filled 2019-10-14: qty 6.7

## 2019-10-14 NOTE — ED Notes (Signed)
Patient verbalizes understanding of discharge instructions. Opportunity for questioning and answers were provided. Armband removed by staff, pt discharged from ED stable & ambulatory  

## 2019-10-14 NOTE — ED Notes (Signed)
Pt ambulated in room without difficulty, O2 sats remained above 93% on RA. PA notified

## 2019-10-14 NOTE — Discharge Instructions (Addendum)
Use the Albuterol inhaler for any shortness of breath (2 puffs every 4 hours) as needed. This may help with cough as well.   Take Tylenol and/or ibuprofen for any fever.

## 2019-10-14 NOTE — ED Provider Notes (Signed)
[facility] EMERGENCY DEPARTMENT Provider Note   CSN: [id number] Arrival date & time: 10/13/19  1713     History Chief Complaint  Patient presents with  . Shortness of Breath    Joseph Ortega is a 64 y.o. male.  Patient with history of T2DM, HTN, OSA presents for evaluation of fever and night sweats after being diagnosed with COVID-19 on 10/09/19. He denies SOB, chest pain, vomiting, diarrhea. He is eating and drinking per his usual. His wife in hospitalized with COVID now. He is a nonsmoker.   The history is provided by the patient. No language interpreter was used.  Shortness of Breath Associated symptoms: fever   Associated symptoms: no abdominal pain and no chest pain        Past Medical History:  Diagnosis Date  . Diabetes mellitus without complication ([facility])   . Hypertension   . Thyroid disease     There are no problems to display for this patient.   Past Surgical History:  Procedure Laterality Date  . HERNIA REPAIR         No family history on file.  Social History   Tobacco Use  . Smoking status: Never Smoker  . Smokeless tobacco: Never Used  Substance Use Topics  . Alcohol use: No  . Drug use: No    Home Medications Prior to Admission medications   Medication Sig Start Date End Date Taking? Authorizing Provider  amLODipine (NORVASC) 10 MG tablet Take 10 mg by mouth daily.    [provider]  aspirin 81 MG tablet Take 81 mg by mouth daily.    [provider]  clindamycin (CLEOCIN) 300 MG capsule Take 1 capsule (300 mg total) by mouth 4 (four) times daily. X 7 days 06/13/13   Ivonne Andrew, PA-C  Exenatide (BYDUREON [place]) Inject 1 each into the skin once a week. Sunday.Info from Pharmacy: Bydureon use weekly as directed.    [provider]  glipiZIDE (GLUCOTROL XL) 10 MG 24 hr tablet Take 10 mg by mouth daily with breakfast.    [provider]  ibuprofen (ADVIL,MOTRIN) 800 MG tablet Take 1 tablet  (800 mg total) by mouth 3 (three) times daily. 03/03/13   Lorre Nick, MD  insulin detemir (LEVEMIR) 100 UNIT/ML injection Inject 54 Units into the skin daily.    [provider]  levothyroxine (SYNTHROID, LEVOTHROID) 125 MCG tablet Take 125 mcg by mouth daily before breakfast.    [provider]  metFORMIN (GLUCOPHAGE) 500 MG tablet Take 500 mg by mouth 2 (two) times daily with a meal. Note that 1000 mg bid is prescribed    [provider]  valsartan (DIOVAN) 320 MG tablet Take 320 mg by mouth daily.    [provider]  Vitamin D, Ergocalciferol, (DRISDOL) 50000 UNITS CAPS capsule Take 50,000 Units by mouth every 7 (seven) days. sunday    [provider]    Allergies    Patient has no known allergies.  Review of Systems   Review of Systems  Constitutional: Positive for fever. Negative for appetite change.  HENT: Negative.  Negative for congestion.   Respiratory: Negative for shortness of breath.   Cardiovascular: Negative.  Negative for chest pain and leg swelling.  Gastrointestinal: Negative.  Negative for abdominal pain.  Musculoskeletal: Negative.  Negative for myalgias.  Skin: Negative.   Neurological: Negative.  Negative for weakness.    Physical Exam Updated Vital Signs BP (!) 148/77 (BP Location: Right Arm)   Pulse  92   Temp 100.1 F (37.8 C) (Oral)   Resp (!) 24   Ht 6\' 1"  (1.854 m)   Wt (!) 120.2 kg   SpO2 93%   BMI 34.96 kg/m   Physical Exam Vitals and nursing note reviewed.  Constitutional:      Appearance: He is well-developed.  HENT:     Head: Normocephalic.  Cardiovascular:     Rate and Rhythm: Normal rate and regular rhythm.  Pulmonary:     Effort: Pulmonary effort is normal.     Breath sounds: Examination of the left-lower field reveals rales. Rales present. No wheezing.  Abdominal:     General: Bowel sounds are normal.     Palpations: Abdomen is soft.     Tenderness: There is no abdominal tenderness.  There is no guarding or rebound.  Musculoskeletal:        General: Normal range of motion.     Cervical back: Normal range of motion and neck supple.     Right lower leg: No edema.     Left lower leg: No edema.  Skin:    General: Skin is warm and dry.     Findings: No rash.  Neurological:     Mental Status: He is alert and oriented to person, place, and time.     ED Results / Procedures / Treatments   Labs (all labs ordered are listed, but only abnormal results are displayed) Labs Reviewed  CBC WITH DIFFERENTIAL/PLATELET  COMPREHENSIVE METABOLIC PANEL  CBG MONITORING, ED    EKG None  Radiology DG Chest Portable 1 View  Result Date: 10/13/2019 CLINICAL DATA:  Shortness of breath history of COVID EXAM: PORTABLE CHEST 1 VIEW COMPARISON:  01/21/2018 FINDINGS: Mildly diminished lung volumes. Minimal hazy basilar and left lower lung opacity. Normal heart size. No pneumothorax. IMPRESSION: Minimal hazy basilar and left lower lung opacity, atelectasis versus minimal pneumonia. Electronically Signed   By: 13/07/2017 M.D.   On: 10/13/2019 19:04    Procedures Procedures (including critical care time)  Medications Ordered in ED Medications  albuterol (VENTOLIN HFA) 108 (90 Base) MCG/ACT inhaler 8 puff (has no administration in time range)    ED Course  I have reviewed the triage vital signs and the nursing notes.  Pertinent labs & imaging results that were available during my care of the patient were reviewed by me and considered in my medical decision making (see chart for details).    MDM Rules/Calculators/A&P                          Patient to ED for evaluation of fever with known diagnosis of COVID 10/09/19. No SOB, chest pain, vomiting.   He is overall well appearing. In NAD. O2 sats 89-91% on RA. Albuterol provided with improvement. He ambulates without desaturation. Continues to have no pain. Fever low grade. Will provide single dose decadron. Feel he is appropriate  for discharge home.   Return precautions discussed.   Final Clinical Impression(s) / ED Diagnoses Final diagnoses:  None   1. COVID-19 infection  Rx / DC Orders ED Discharge Orders    None       10/11/19 10/14/19 0342    10/16/19, MD 10/14/19 [phone]

## 2019-10-14 NOTE — ED Notes (Addendum)
Pt's CBG 91, PA advised of same. Pt provided with Orange Juice per request

## 2021-02-10 ENCOUNTER — Ambulatory Visit (INDEPENDENT_AMBULATORY_CARE_PROVIDER_SITE_OTHER): Payer: BC Managed Care – PPO

## 2021-02-10 ENCOUNTER — Encounter (HOSPITAL_COMMUNITY): Payer: Self-pay | Admitting: Emergency Medicine

## 2021-02-10 ENCOUNTER — Ambulatory Visit (HOSPITAL_COMMUNITY)
Admission: EM | Admit: 2021-02-10 | Discharge: 2021-02-10 | Disposition: A | Discharge status: Home / Self Care | Payer: BC Managed Care – PPO | Attending: Emergency Medicine | Admitting: Emergency Medicine

## 2021-02-10 ENCOUNTER — Other Ambulatory Visit: Payer: Self-pay

## 2021-02-10 DIAGNOSIS — L97929 Non-pressure chronic ulcer of unspecified part of left lower leg with unspecified severity: Secondary | ICD-10-CM

## 2021-02-10 DIAGNOSIS — E11622 Type 2 diabetes mellitus with other skin ulcer: Secondary | ICD-10-CM

## 2021-02-10 MED ORDER — CEFTRIAXONE SODIUM 1 G IJ SOLR
1.0000 g | Freq: Once | INTRAMUSCULAR | Status: AC
  Administered 2021-02-10: 1 g via INTRAMUSCULAR

## 2021-02-10 MED ORDER — LIDOCAINE HCL (PF) 1 % IJ SOLN
INTRAMUSCULAR | Status: AC
  Filled 2021-02-10: qty 4

## 2021-02-10 MED ORDER — DOXYCYCLINE HYCLATE 100 MG PO CAPS: 100.0000 mg | ORAL_CAPSULE | Freq: Two times a day (BID) | ORAL | 0 refills | Status: AC

## 2021-02-10 MED ORDER — CEFTRIAXONE SODIUM 1 G IJ SOLR
INTRAMUSCULAR | Status: AC
  Filled 2021-02-10: qty 10

## 2021-02-10 NOTE — Discharge Instructions (Addendum)
Your x-ray was normal.  There is no evidence of a bone infection at this time.  I gave you a shot of Rocephin here today.  Finish doxycycline, unless a provider tells you to stop.  Keep this clean with soap and water, continue doing wound care as you have been doing.  I have placed a referral to the wound care center.

## 2021-02-10 NOTE — ED Triage Notes (Signed)
Pt is present today with an ulcer on both legs that he noticed x3 weeks ago.

## 2021-02-10 NOTE — ED Provider Notes (Signed)
HPI  SUBJECTIVE:  Joseph Ortega is a 65 y.o. male who presents with a nonhealing scrape on his left shin that he sustained on the wheel chair 3 weeks ago.  He states that it is erythematous, hot to the touch and states that the pain is getting worse.  He reports lower extremity swelling and states that his left calf is larger than the right.  This is new.  He reports serosanguineous drainage that has now become yellow and cloudy.  He has been doing daily debridement, elevation, seaweed dressing and other wound care with improvement in his symptoms.  Symptoms are worse with debridement.  He states that the size of the ulcer is getting better.  He has a past medical history of poorly controlled diabetes, last A1c was over 10, hypertension, he states his normal systolic blood pressure is anywhere between 160 and 180s, lower extremity edema, neuropathy and MRSA.  No history of chronic kidney disease, venous stasis, PAD/PVD.  OEU:MPNTIR, Diana Eves., MD He has follow-up wound care in [facility] in 2-1/2 weeks, but would like a referral here in [place].   Past Medical History:  Diagnosis Date   Diabetes mellitus without complication (HCC)    Hypertension    Thyroid disease     Past Surgical History:  Procedure Laterality Date   HERNIA REPAIR      History reviewed. No pertinent family history.  Social History   Tobacco Use   Smoking status: Never   Smokeless tobacco: Never  Substance Use Topics   Alcohol use: No   Drug use: No    No current facility-administered medications for this encounter.  Current Outpatient Medications:    doxycycline (VIBRAMYCIN) 100 MG capsule, Take 1 capsule (100 mg total) by mouth 2 (two) times daily for 10 days., Disp: 20 capsule, Rfl: 0   doxazosin (CARDURA) 8 MG tablet, Take 8 mg by mouth daily., Disp: , Rfl:    DULoxetine (CYMBALTA) 20 MG capsule, Take 20 mg by mouth 2 (two) times daily., Disp: , Rfl:    gabapentin (NEURONTIN) 300 MG capsule, Take 300 mg by  mouth 2 (two) times daily., Disp: , Rfl:    insulin detemir (LEVEMIR) 100 UNIT/ML injection, Inject 50 Units into the skin daily. , Disp: , Rfl:    levothyroxine (SYNTHROID, LEVOTHROID) 125 MCG tablet, Take 125 mcg by mouth daily before breakfast., Disp: , Rfl:    metoprolol tartrate (LOPRESSOR) 50 MG tablet, Take 50 mg by mouth daily., Disp: , Rfl:    mirtazapine (REMERON) 15 MG tablet, Take 7.5 mg by mouth at bedtime., Disp: , Rfl:    NOVOLOG FLEXPEN 100 UNIT/ML FlexPen, Inject 10 Units into the skin in the morning, at noon, and at bedtime. Sliding scale, Disp: , Rfl:    TRULICITY 3 MG/0.5ML SOPN, Inject 3 mg into the skin every Thursday., Disp: , Rfl:    Vitamin D, Ergocalciferol, (DRISDOL) 50000 UNITS CAPS capsule, Take 50,000 Units by mouth every Sunday. sunday , Disp: , Rfl:   No Known Allergies   ROS  As noted in HPI.   Physical Exam  BP (!) 172/72 (BP Location: Left Arm)   Pulse 80   Temp (!) 97.4 F (36.3 C) (Oral)   Resp 17   SpO2 97%   Constitutional: Well developed, well nourished, no acute distress Eyes:  EOMI, conjunctiva normal bilaterally HENT: Normocephalic, atraumatic,mucus membranes moist Respiratory: Normal inspiratory effort Cardiovascular: Normal rate GI: nondistended skin: No rash, skin intact Musculoskeletal: Left lower extremity larger  than the right.  Positive increased temperature, erythema, diffuse tenderness.  4 x 2 cm ulcer with good granulation tissue at the base.  No expressible purulent drainage, no subcutaneous air.    Neurologic: Alert & oriented x 3, no focal neuro deficits Psychiatric: Speech and behavior appropriate   ED Course   Medications  cefTRIAXone (ROCEPHIN) injection 1 g (1 g Intramuscular Given 02/10/21 1546)    Orders Placed This Encounter  Procedures   DG Tibia/Fibula Left    Standing Status:   Standing    Number of Occurrences:   1    Order Specific Question:   Reason for Exam (SYMPTOM  OR DIAGNOSIS REQUIRED)     Answer:   Diabetic ulcer mid shin.  Rule out osteomyelitis   AMB referral to wound care center    Referral Priority:   Routine    Referral Type:   Consultation    Referral Reason:   Specialty Services Required    Number of Visits Requested:   1   Apply dressing    Left lower leg    Standing Status:   Standing    Number of Occurrences:   1    No results found for this or any previous visit (from the past 24 hour(s)). DG Tibia/Fibula Left  Result Date: 02/10/2021 CLINICAL DATA:  Diabetic ulcer. EXAM: LEFT TIBIA AND FIBULA - 2 VIEW COMPARISON:  None. FINDINGS: There is no evidence of fracture or other focal bone lesions. No lytic destruction is seen to suggest osteomyelitis. Soft tissues are unremarkable. IMPRESSION: Negative. Electronically Signed   By: Lupita Raider M.D.   On: 02/10/2021 16:11    ED Clinical Impression  1. Diabetic ulcer of left lower leg [facility])      ED Assessment/Plan  Will check x-ray to rule out osteomyelitis due to poorly controlled diabetes, and duration of symptoms, although I do not appreciate any exposed bone.  The wound base looks clean.  Will give 1 g of Rocephin due to the diffuse tenderness, sent home with 10 to 14 days of doxycycline as patient states this worked well for him last time.  He has pain medication that he can take.  Continue wound care, keeping clean with soap and water.  Placing referral to the wound care center per patient request.  ER return precautions given.  Blood pressure noted.  Patient states this is his baseline.  Reviewed imaging independently.  No osteomyelitis.  See radiology report for full details.  X-ray negative for osteomyelitis.  Plan as above  Discussed imaging, MDM, treatment plan, and plan for follow-up with patient. Discussed sn/sx that should prompt return to the ED. patient agrees with plan.   Meds ordered this encounter  Medications   cefTRIAXone (ROCEPHIN) injection 1 g   doxycycline (VIBRAMYCIN) 100 MG  capsule    Sig: Take 1 capsule (100 mg total) by mouth 2 (two) times daily for 10 days.    Dispense:  20 capsule    Refill:  0      *This clinic note was created using Scientist, clinical (histocompatibility and immunogenetics). Therefore, there may be occasional mistakes despite careful proofreading.  ?    Domenick Gong, MD 02/11/21 [phone]

## 2021-02-15 ENCOUNTER — Encounter (HOSPITAL_BASED_OUTPATIENT_CLINIC_OR_DEPARTMENT_OTHER): Payer: BC Managed Care – PPO | Attending: Internal Medicine | Admitting: Physician Assistant

## 2021-02-15 ENCOUNTER — Other Ambulatory Visit: Payer: Self-pay

## 2021-02-15 DIAGNOSIS — E11621 Type 2 diabetes mellitus with foot ulcer: Secondary | ICD-10-CM | POA: Insufficient documentation

## 2021-02-15 DIAGNOSIS — L97812 Non-pressure chronic ulcer of other part of right lower leg with fat layer exposed: Secondary | ICD-10-CM | POA: Insufficient documentation

## 2021-02-15 DIAGNOSIS — E11622 Type 2 diabetes mellitus with other skin ulcer: Secondary | ICD-10-CM | POA: Diagnosis not present

## 2021-02-15 DIAGNOSIS — L97822 Non-pressure chronic ulcer of other part of left lower leg with fat layer exposed: Secondary | ICD-10-CM | POA: Diagnosis not present

## 2021-02-15 DIAGNOSIS — I872 Venous insufficiency (chronic) (peripheral): Secondary | ICD-10-CM | POA: Diagnosis not present

## 2021-02-15 NOTE — Progress Notes (Signed)
MESSI, TWEDT ([id number]) Visit Report for 02/15/2021 Chief Complaint Document Details Patient Name: Date of Service: Joseph Ortega, Joseph Ortega 02/15/2021 1:15 PM Medical Record Number: [id number] Patient Account Number: [id number] Date of Birth/Sex: Treating RN: 1955/08/24 (65 y.o. M) Primary Care Provider: Dennis Bast Other Clinician: Referring Provider: Treating Provider/Extender: Vladimir Faster, Suzan Nailer in Treatment: 0 Information Obtained from: Patient Chief Complaint Bilateral LE Ulcers Electronic Signature(s) Signed: 02/15/2021 2:11:00 PM By: Lenda Kelp PA-C Entered By: Lenda Kelp on 02/15/2021 14:11:00 -------------------------------------------------------------------------------- HPI Details Patient Name: Date of Service: Joseph Ortega, Joseph Ortega 02/15/2021 1:15 PM Medical Record Number: [id number] Patient Account Number: [id number] Date of Birth/Sex: Treating RN: [birth year] (65 y.o. M) Primary Care Provider: Dennis Bast Other Clinician: Referring Provider: Treating Provider/Extender: Charlott Rakes in Treatment: 0 History of Present Illness HPI Description: 02/15/2021 upon evaluation today patient presents for initial inspection here in our clinic concerning wounds over the bilateral lower extremities. He subsequently has previously gone to the [facility] wound care center when he has had previous issues and was very pleased with service there. Subsequently however he did have issues with travel times about an hour for him to drive there and back subsequently he was coming here this time to try to work things out a little bit more conveniently for him he also drives to [employer] every day for work. Subsequently the patient's hemoglobin A1c is 10.3. He in the past has done well with silver alginate and Unna boot dressing. He is currently on doxycycline for 10 days and has been using his own compression socks. He does have a history of  chronic venous insufficiency and diabetes mellitus type 2. 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 M) Primary Care Provider: Other Clinician: Dennis Bast Referring Provider: Treating Provider/Extender: Charlott Rakes in Treatment: 0 Constitutional patient is hypertensive.. pulse regular and within target range for patient.Marland Kitchen respirations regular, non-labored and within target range for patient.Marland Kitchen temperature within target range for patient.. Well-nourished and well-hydrated in no acute distress. Eyes conjunctiva clear no eyelid edema noted. pupils equal round and reactive to light and accommodation. Ears, Nose, Mouth, and Throat no gross abnormality of ear auricles or external auditory canals. normal hearing noted during conversation. mucus membranes moist. Respiratory normal breathing without difficulty. Cardiovascular 1+ dorsalis pedis/posterior tibialis pulses. 2+ pitting edema of the bilateral lower extremities. Musculoskeletal normal gait and posture. no significant deformity or arthritic changes, no loss or range of motion, no clubbing. Psychiatric this patient is able to make decisions and demonstrates good insight into disease process. Alert and Oriented x 3. pleasant and cooperative. Notes Upon inspection patient's  wounds currently appear to be fairly clean. I do not see any need for sharp debridement. The biggest issue I see is the drainage he does have significant issues here with what appears to be lymphedema and chronic venous insufficiency. He also has  elevated blood pressure currently. For that reason I am unsure whether his ABIs are really legitimate or not. He seems to have pretty good pulses but again with all the swelling is somewhat difficult to tell. Subsequently I do believe that a formal arterial study would be good for him to be perfectly honest in order to ensure what we can do from a compression standpoint. Electronic Signature(s) Signed: 02/15/2021 6:09:54 PM By: Lenda Kelp PA-C Entered By: Lenda Kelp on 02/15/2021 18:09:54 -------------------------------------------------------------------------------- Physician Orders Details Patient Name: Date of Service: Joseph Ortega, Joseph Ortega 02/15/2021 1:15 PM Medical Record Number: [id number] Patient Account Number: [id number] Date of Birth/Sex: Treating RN: 04/13/1955 (65 y.o. Lytle Michaels Primary Care Provider: Dennis Bast Other Clinician: Referring Provider: Treating Provider/Extender: Lorin Mercy Weeks in Treatment: 0 Verbal / Phone Orders: No Diagnosis Coding ICD-10 Coding Code Description I87.2 Venous insufficiency (chronic) (peripheral) L97.812 Non-pressure chronic ulcer of other part of right lower leg with fat layer exposed L97.822 Non-pressure chronic ulcer of other part of left lower leg with fat layer exposed E11.622 Type 2 diabetes mellitus with other skin ulcer Follow-up Appointments ppointment in 1 week. - with Leonard Schwartz Return A Bathing/ Shower/ Hygiene May shower with protection but do not get wound dressing(s) wet. - Can use cast protector bags from [facility] or [facility] Edema Control - Lymphedema / SCD / Other Elevate legs to the level of the heart or above for 30 minutes Joseph Ortega and/or when sitting, a  frequency of: Avoid standing for long periods of time. Additional Orders / Instructions Follow Nutritious Diet - Monitor blood sugar-keep controlled to help with wound healing Wound Treatment Wound #1 - Lower Leg Wound Laterality: Right, Lateral Cleanser: Soap and Water 1 x Per Week/30 Days Discharge Instructions: May shower and wash wound with dial antibacterial soap and water prior to dressing change. Cleanser: Wound Cleanser 1 x Per Week/30 Days Discharge Instructions: Cleanse the wound with wound cleanser prior to applying a clean dressing using gauze sponges, not tissue or cotton balls. Prim Dressing: KerraCel Ag Gelling Fiber Dressing, 4x5 in (silver alginate) 1 x Per Week/30 Days ary Discharge Instructions: Apply silver alginate to wound bed as instructed Secondary Dressing: ABD Pad, 5x9 1 x Per Week/30 Days Discharge Instructions: Apply over primary dressing as directed. Compression Wrap: Unnaboot w/Calamine, 4x10 (in/yd) 1 x Per Week/30 Days Discharge Instructions: Apply Unnaboot as directed. Compression Wrap: Coban Self-Adherent Wrap 4x5 (in/yd) 1 x Per Week/30 Days Discharge Instructions: Apply over Kerlix as directed. Wound #2 - Lower Leg Wound Laterality: Left, Anterior Cleanser: Soap and Water 1 x Per Week/30 Days Discharge Instructions: May shower and wash wound with dial antibacterial soap and water prior to dressing change. Cleanser: Wound Cleanser 1 x Per Week/30 Days Discharge Instructions: Cleanse the wound with wound cleanser prior to applying a clean dressing using gauze sponges, not tissue or cotton balls. Prim Dressing: KerraCel Ag Gelling Fiber Dressing, 4x5 in (silver alginate) 1 x Per Week/30 Days ary Discharge Instructions: Apply silver alginate to wound bed as instructed Secondary Dressing: ABD Pad, 5x9 1 x Per Week/30 Days Discharge Instructions: Apply over primary dressing as directed. Compression Wrap: Unnaboot w/Calamine, 4x10 (in/yd) 1 x Per Week/30  Days Discharge Instructions: Apply Unnaboot as directed. Compression Wrap: Coban Self-Adherent Wrap 4x5 (in/yd) 1 x Per Week/30 Days Discharge Instructions: Apply over Kerlix as directed. Services and Therapies nkle Brachial Index (ABI) and TBI: Bilateral - ABI/TBI: Edema and Non-healing Wounds to bilateral legs. ICD: L97.822 - (ICD10  X52.841 - Non- A pressure chronic ulcer of other part of right lower leg with fat layer exposed) Electronic Signature(s) Signed: 02/15/2021 5:26:09 PM By: Antonieta Iba Signed: 02/15/2021 6:11:30 PM By: Lenda Kelp PA-C Entered By: Antonieta Iba on 02/15/2021 14:24:27 Prescription 02/15/2021 -------------------------------------------------------------------------------- Frederich Balding PA Patient Name: Provider: 1955-06-20 [id number] Date of Birth: NPI#Judie Petit [id number] Sex: DEA #: [id number] Phone #: License #: Eligha Bridegroom [facility] Patient Address: [phone] [facility] [address] [place] [address] , [address] [place], [place] [address] [phone] Allergies No Known Allergies Provider's Orders nkle Brachial Index (ABI) and TBI: Bilateral - ICD10: L97.812 - ABI/TBI: Edema and Non-healing Wounds to bilateral legs. ICD: L97.822 A Hand Signature: Date(s): Electronic Signature(s) Signed: 02/15/2021 5:26:09 PM By: Antonieta Iba Signed: 02/15/2021 6:11:30 PM By: Lenda Kelp PA-C Entered By: Antonieta Iba on 02/15/2021 14:24:27 -------------------------------------------------------------------------------- Problem List Details Patient Name: Date of Service: Joseph Ortega, Joseph Ortega 02/15/2021 1:15 PM Medical Record Number: [id number] Patient Account Number: [id number] Date of Birth/Sex: Treating RN: 08/19/1955 (65 y.o. M) Primary Care Provider: Dennis Bast Other Clinician: Referring Provider: Treating Provider/Extender: Charlott Rakes in Treatment: 0 Active  Problems ICD-10 Encounter Code Description Active Date MDM Diagnosis I87.2 Venous insufficiency (chronic) (peripheral) 02/15/2021 No Yes L97.812 Non-pressure chronic ulcer of other part of right lower leg with fat layer 02/15/2021 No Yes exposed L97.822 Non-pressure chronic ulcer of other part of left lower leg with fat layer 02/15/2021 No Yes exposed E11.622 Type 2 diabetes mellitus with other skin ulcer 02/15/2021 No Yes Inactive Problems Resolved Problems Electronic Signature(s) Signed: 02/15/2021 2:10:39 PM By: Lenda Kelp PA-C Entered By: Lenda Kelp on 02/15/2021 14:10:39 -------------------------------------------------------------------------------- Progress Note Details Patient Name: Date of Service: Joseph Ortega, Joseph Ortega 02/15/2021 1:15 PM Medical Record Number: [id number] Patient Account Number: [id number] Date of Birth/Sex: Treating RN: [birth year] (65 y.o. M) Primary Care Provider: Dennis Bast Other Clinician: Referring Provider: Treating Provider/Extender: Lorin Mercy Weeks in Treatment: 0 Subjective Chief Complaint Information obtained from Patient Bilateral LE Ulcers History of Present Illness (HPI) 02/15/2021 upon evaluation today patient presents for initial inspection here in our clinic concerning wounds over the bilateral lower extremities. He subsequently has previously gone to the [facility] wound care center when he has had previous issues and was very pleased with service there. Subsequently however he did have issues with travel times about an hour for him to drive there and back subsequently he was coming here this time to try to work things out a little bit more conveniently for him he also drives to [employer] every day for work. Subsequently the patient's hemoglobin A1c is 10.3. He in the past has done well with silver alginate and Unna boot dressing. He is currently on doxycycline for 10 days and has been using his  own compression socks. He does have a history of chronic venous insufficiency and diabetes mellitus type 2. 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 Patient History Information obtained from Patient. Allergies No Known Allergies Family History Diabetes - Father,Paternal Grandparents,Siblings, Stroke - Maternal Grandparents, No family history of Cancer, Heart Disease, Hereditary Spherocytosis, Hypertension, Kidney  Disease, Lung Disease, Seizures, Thyroid Problems, Tuberculosis. Social History Never smoker, Marital Status - Married, Alcohol Use - Joseph Ortega - beer, Drug Use - No History, Caffeine Use - Joseph Ortega - coffee. Medical History Respiratory Patient has history of Sleep Apnea - Bipap Cardiovascular Patient has history of Hypertension Endocrine Patient has history of Type II Diabetes Musculoskeletal Patient has history of Osteoarthritis Neurologic Patient has history of Neuropathy Patient is treated with Insulin. Blood sugar is tested. Medical A Surgical History Notes nd Endocrine Hypothyroidism Review of Systems (ROS) Eyes Complains or has symptoms of Glasses / Contacts - Reading. Ear/Nose/Mouth/Throat Denies complaints or symptoms of Chronic sinus problems or rhinitis. Gastrointestinal Denies complaints or symptoms of Frequent diarrhea, Nausea, Vomiting. Genitourinary Denies complaints or symptoms of Frequent urination. Integumentary (Skin) Complains or has symptoms of Wounds - legs. Psychiatric Denies complaints or symptoms of Claustrophobia, Suicidal. Objective Constitutional patient is hypertensive.. pulse regular and within target range for patient.Marland Kitchen respirations regular, non-labored and within target range for patient.Marland Kitchen temperature within  target range for patient.. Well-nourished and well-hydrated in no acute distress. Vitals Time Taken: 1:22 PM, Height: 72 in, Source: Stated, Weight: 284 lbs, Source: Stated, BMI: 38.5, Temperature: 98.4 F, Pulse: 70 bpm, Respiratory Rate: 18 breaths/min, Blood Pressure: 180/100 mmHg. Eyes conjunctiva clear no eyelid edema noted. pupils equal round and reactive to light and accommodation. Ears, Nose, Mouth, and Throat no gross abnormality of ear auricles or external auditory canals. normal hearing noted during conversation. mucus membranes moist. Respiratory normal breathing without difficulty. Cardiovascular 1+ dorsalis pedis/posterior tibialis pulses. 2+ pitting edema of the bilateral lower extremities. Musculoskeletal normal gait and posture. no significant deformity or arthritic changes, no loss or range of motion, no clubbing. Psychiatric this patient is able to make decisions and demonstrates good insight into disease process. Alert and Oriented x 3. pleasant and cooperative. General Notes: Upon inspection patient's wounds currently appear to be fairly clean. I do not see any need for sharp debridement. The biggest issue I see is the drainage he does have significant issues here with what appears to be lymphedema and chronic venous insufficiency. He also has elevated blood pressure currently. For that reason I am unsure whether his ABIs are really legitimate or not. He seems to have pretty good pulses but again with all the swelling is somewhat difficult to tell. Subsequently I do believe that a formal arterial study would be good for him to be perfectly honest in order to ensure what we can do from a compression standpoint. Integumentary (Hair, Skin) Wound #1 status is Open. Original cause of wound was Gradually Appeared. The date acquired was: 02/15/2021. The wound is located on the Right,Lateral Lower Leg. The wound measures 2cm length x 1.7cm width x 0.1cm depth; 2.67cm^2 area and  0.267cm^3 volume. There is Fat Layer (Subcutaneous Tissue) exposed. There is no tunneling or undermining noted. There is a medium amount of serosanguineous drainage noted. The wound margin is distinct with the outline attached to the wound base. There is large (67-100%) red granulation within the wound bed. There is a small (1-33%) amount of necrotic tissue within the wound bed including Adherent Slough. Wound #2 status is Open. Original cause of wound was Trauma. The date acquired was: 01/18/2021. The wound is located on the Left,Anterior Lower Leg. The wound measures 3.3cm length x 2.5cm width x 0.2cm depth; 6.48cm^2 area and 1.296cm^3 volume. There is Fat Layer (Subcutaneous Tissue) exposed. There is no tunneling or undermining noted. There is a medium amount of serosanguineous drainage  noted. The wound margin is distinct with the outline attached to the wound base. There is large (67-100%) red granulation within the wound bed. There is a small (1-33%) amount of necrotic tissue within the wound bed including Adherent Slough. Assessment Active Problems ICD-10 Venous insufficiency (chronic) (peripheral) Non-pressure chronic ulcer of other part of right lower leg with fat layer exposed Non-pressure chronic ulcer of other part of left lower leg with fat layer exposed Type 2 diabetes mellitus with other skin ulcer Procedures Wound #1 Pre-procedure diagnosis of Wound #1 is a Venous Leg Ulcer located on the Right,Lateral Lower Leg . There was a Radio broadcast assistant Compression Therapy Procedure by Antonieta Iba, RN. Post procedure Diagnosis Wound #1: Same as Pre-Procedure Wound #2 Pre-procedure diagnosis of Wound #2 is a Trauma, Other located on the Left,Anterior Lower Leg . There was a Radio broadcast assistant Compression Therapy Procedure by Antonieta Iba, RN. Post procedure Diagnosis Wound #2: Same as Pre-Procedure Plan Follow-up Appointments: Return Appointment in 1 week. - with Luana Shu Shower/  Hygiene: May shower with protection but do not get wound dressing(s) wet. - Can use cast protector bags from [facility] or [facility] Edema Control - Lymphedema / SCD / Other: Elevate legs to the level of the heart or above for 30 minutes Joseph Ortega and/or when sitting, a frequency of: Avoid standing for long periods of time. Additional Orders / Instructions: Follow Nutritious Diet - Monitor blood sugar-keep controlled to help with wound healing Services and Therapies ordered were: Ankle Brachial Index (ABI) and TBI: Bilateral - ABI/TBI: Edema and Non-healing Wounds to bilateral legs. ICD: L97.822 WOUND #1: - Lower Leg Wound Laterality: Right, Lateral Cleanser: Soap and Water 1 x Per Week/30 Days Discharge Instructions: May shower and wash wound with dial antibacterial soap and water prior to dressing change. Cleanser: Wound Cleanser 1 x Per Week/30 Days Discharge Instructions: Cleanse the wound with wound cleanser prior to applying a clean dressing using gauze sponges, not tissue or cotton balls. Prim Dressing: KerraCel Ag Gelling Fiber Dressing, 4x5 in (silver alginate) 1 x Per Week/30 Days ary Discharge Instructions: Apply silver alginate to wound bed as instructed Secondary Dressing: ABD Pad, 5x9 1 x Per Week/30 Days Discharge Instructions: Apply over primary dressing as directed. Com pression Wrap: Unnaboot w/Calamine, 4x10 (in/yd) 1 x Per Week/30 Days Discharge Instructions: Apply Unnaboot as directed. Com pression Wrap: Coban Self-Adherent Wrap 4x5 (in/yd) 1 x Per Week/30 Days Discharge Instructions: Apply over Kerlix as directed. WOUND #2: - Lower Leg Wound Laterality: Left, Anterior Cleanser: Soap and Water 1 x Per Week/30 Days Discharge Instructions: May shower and wash wound with dial antibacterial soap and water prior to dressing change. Cleanser: Wound Cleanser 1 x Per Week/30 Days Discharge Instructions: Cleanse the wound with wound cleanser prior to applying a clean dressing using gauze  sponges, not tissue or cotton balls. Prim Dressing: KerraCel Ag Gelling Fiber Dressing, 4x5 in (silver alginate) 1 x Per Week/30 Days ary Discharge Instructions: Apply silver alginate to wound bed as instructed Secondary Dressing: ABD Pad, 5x9 1 x Per Week/30 Days Discharge Instructions: Apply over primary dressing as directed. Com pression Wrap: Unnaboot w/Calamine, 4x10 (in/yd) 1 x Per Week/30 Days Discharge Instructions: Apply Unnaboot as directed. Com pression Wrap: Coban Self-Adherent Wrap 4x5 (in/yd) 1 x Per Week/30 Days Discharge Instructions: Apply over Kerlix as directed. 1. Would recommend currently that we going to continue with the wound care measures as before and the patient is in agreement the plan. This includes the use of the  silver alginate dressing which I think is good to do quite well in the past is responded well to this. 2. I am also can recommend Unna boot wrap since he is done well with this in the past and never had any complications I think that is appropriate. 3. I am also going to see about getting him set up for formal arterial study and depending on the results of this we may increase the wrap to something a little bit stronger to help with edema control a lot depends on what we see in that regard. 4. I am also can recommend he should be elevating his legs much as possible to help with edema control. We will see patient back for reevaluation in 1 week here in the clinic. If anything worsens or changes patient will contact our office for additional recommendations. Electronic Signature(s) Signed: 02/15/2021 6:10:50 PM By: Lenda Kelp PA-C Entered By: Lenda Kelp on 02/15/2021 18:10:50 -------------------------------------------------------------------------------- HxROS Details Patient Name: Date of Service: Joseph Ortega, Joseph Ortega 02/15/2021 1:15 PM Medical Record Number: [id number] Patient Account Number: [id number] Date of Birth/Sex: Treating RN: [birth year]  (65 y.o. Lytle Michaels Primary Care Provider: Dennis Bast Other Clinician: Referring Provider: Treating Provider/Extender: Vladimir Faster, Benjie Karvonen Weeks in Treatment: 0 Information Obtained From Patient Eyes Complaints and Symptoms: Positive for: Glasses / Contacts - Reading Ear/Nose/Mouth/Throat Complaints and Symptoms: Negative for: Chronic sinus problems or rhinitis Gastrointestinal Complaints and Symptoms: Negative for: Frequent diarrhea; Nausea; Vomiting Genitourinary Complaints and Symptoms: Negative for: Frequent urination Integumentary (Skin) Complaints and Symptoms: Positive for: Wounds - legs Psychiatric Complaints and Symptoms: Negative for: Claustrophobia; Suicidal Hematologic/Lymphatic Respiratory Medical History: Positive for: Sleep Apnea - Bipap Cardiovascular Medical History: Positive for: Hypertension Endocrine Medical History: Positive for: Type II Diabetes Past Medical History Notes: Hypothyroidism Time with diabetes: 31 years Treated with: Insulin Blood sugar tested every day: Yes Tested : Joseph Ortega Immunological Musculoskeletal Medical History: Positive for: Osteoarthritis Neurologic Medical History: Positive for: Neuropathy Oncologic Immunizations Pneumococcal Vaccine: Received Pneumococcal Vaccination: No Implantable Devices None Family and Social History Cancer: No; Diabetes: Yes - Father,Paternal Grandparents,Siblings; Heart Disease: No; Hereditary Spherocytosis: No; Hypertension: No; Kidney Disease: No; Lung Disease: No; Seizures: No; Stroke: Yes - Maternal Grandparents; Thyroid Problems: No; Tuberculosis: No; Never smoker; Marital Status - Married; Alcohol Use: Joseph Ortega - beer; Drug Use: No History; Caffeine Use: Joseph Ortega - coffee; Financial Concerns: No; Food, Clothing or Shelter Needs: No; Support System Lacking: No; Transportation Concerns: No Electronic Signature(s) Signed: 02/15/2021 5:26:09 PM By: Antonieta Iba Signed:  02/15/2021 6:11:30 PM By: Lenda Kelp PA-C Entered By: Antonieta Iba on 02/15/2021 13:30:26 -------------------------------------------------------------------------------- SuperBill Details Patient Name: Date of Service: Joseph Ortega, Joseph Ortega 02/15/2021 Medical Record Number: [id number] Patient Account Number: [id number] Date of Birth/Sex: Treating RN: [birth year] (65 y.o. Lytle Michaels Primary Care Provider: Dennis Bast Other Clinician: Referring Provider: Treating Provider/Extender: Lorin Mercy Weeks in Treatment: 0 Diagnosis Coding ICD-10 Codes Code Description I87.2 Venous insufficiency (chronic) (peripheral) L97.812 Non-pressure chronic ulcer of other part of right lower leg with fat layer exposed L97.822 Non-pressure chronic ulcer of other part of left lower leg with fat layer exposed E11.622 Type 2 diabetes mellitus with other skin ulcer Facility Procedures CPT4 Code: [id number] Description: 99215 - WOUND CARE VISIT-LEV 5 EST PT Modifier: 25 Quantity: 1 CPT4 Code: [id number] Description: 29580 - APPLY UNNA BOOT/PROFO BILATERAL ICD-10 Diagnosis Description 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 Modifier: ed d Quantity: 1 Physician Procedures : CPT4 Code Description Modifier [id number] 99204 - WC PHYS LEVEL 4 - NEW PT ICD-10 Diagnosis Description I87.2 Venous insufficiency (chronic) (peripheral) L97.812 Non-pressure chronic ulcer of other part of right lower leg with fat layer exposed L97.822  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

## 2021-02-15 NOTE — Progress Notes (Signed)
Joseph Ortega ([id number]) Visit Report for 02/15/2021 Abuse/Suicide Risk Screen Details Patient Name: Date of Service: Joseph Ortega, Joseph Ortega 02/15/2021 1:15 PM Medical Record Number: [id number] Patient Account Number: [id number] Date of Birth/Sex: Treating RN: 1955-10-08 (65 y.o. Lytle Michaels Primary Care Tacuma Graffam: Dennis Bast Other Clinician: Referring Joice Nazario: Treating Rossana Molchan/Extender: Vladimir Faster, Benjie Karvonen Weeks in Treatment: 0 Abuse/Suicide Risk Screen Items Answer ABUSE RISK SCREEN: Has anyone close to you tried to hurt or harm you recentlyo No Do you feel uncomfortable with anyone in your familyo No Has anyone forced you do things that you didnt want to doo No Electronic Signature(s) Signed: 02/15/2021 5:26:09 PM By: Antonieta Iba Entered By: Antonieta Iba on 02/15/2021 13:30:41 -------------------------------------------------------------------------------- Activities of Daily Living Details Patient Name: Date of Service: Joseph Ortega 02/15/2021 1:15 PM Medical Record Number: [id number] Patient Account Number: [id number] Date of Birth/Sex: Treating RN: 1955-11-26 (65 y.o. Lytle Michaels Primary Care Majd Tissue: Dennis Bast Other Clinician: Referring Arin Peral: Treating Sayeed Weatherall/Extender: Vladimir Faster, Benjie Karvonen Weeks in Treatment: 0 Activities of Daily Living Items Answer Activities of Daily Living (Please select one for each item) Drive Automobile Completely Able T Medications ake Completely Able Use T elephone Completely Able Care for Appearance Completely Able Use T oilet Completely Able Bath / Shower Completely Able Dress Self Completely Able Feed Self Completely Able Walk Completely Able Get In / Out Bed Completely Able Housework Completely Able Prepare Meals Completely Able Handle Money Completely Able Shop for Self Completely Able Electronic Signature(s) Signed: 02/15/2021 5:26:09 PM By: Antonieta Iba Entered By: Antonieta Iba on  02/15/2021 13:31:02 -------------------------------------------------------------------------------- Education Screening Details Patient Name: Date of Service: Joseph Ortega 02/15/2021 1:15 PM Medical Record Number: [id number] Patient Account Number: [id number] Date of Birth/Sex: Treating RN: March 07, 1956 (65 y.o. Lytle Michaels Primary Care Kierre Hintz: Dennis Bast Other Clinician: Referring Wilda Wetherell: Treating Stillman Buenger/Extender: Charlott Rakes in Treatment: 0 Primary Learner Assessed: Patient Learning Preferences/Education Level/Primary Language Learning Preference: Explanation, Demonstration, Printed Material Highest Education Level: College or Above Preferred Language: English Cognitive Barrier Language Barrier: No Translator Needed: No Memory Deficit: No Emotional Barrier: No Cultural/Religious Beliefs Affecting Medical Care: No Physical Barrier Impaired Vision: Yes Glasses, reading Impaired Hearing: No Decreased Hand dexterity: No Knowledge/Comprehension Knowledge Level: High Comprehension Level: High Ability to understand written instructions: High Ability to understand verbal instructions: High Motivation Anxiety Level: Calm Cooperation: Cooperative Education Importance: Acknowledges Need Interest in Health Problems: Asks Questions Perception: Coherent Willingness to Engage in Self-Management High Activities: Readiness to Engage in Self-Management High Activities: Electronic Signature(s) Signed: 02/15/2021 5:26:09 PM By: Antonieta Iba Entered By: Antonieta Iba on 02/15/2021 13:31:34 -------------------------------------------------------------------------------- Fall Risk Assessment Details Patient Name: Date of Service: Joseph Ortega 02/15/2021 1:15 PM Medical Record Number: [id number] Patient Account Number: [id number] Date of Birth/Sex: Treating RN: 03/28/1955 (66 y.o. Lytle Michaels Primary Care Macy Polio: Dennis Bast Other  Clinician: Referring Eleonora Peeler: Treating Even Budlong/Extender: Lorin Mercy Weeks in Treatment: 0 Fall Risk Assessment Items Have you had 2 or more falls in the last 12 monthso 0 No Have you had any fall that resulted in injury in the last 12 monthso 0 No FALLS RISK SCREEN History of falling - immediate or within 3 months 0 No Secondary diagnosis (Do you have 2 or more medical diagnoseso) 15 Yes Ambulatory aid None/bed rest/wheelchair/nurse 0 Yes Crutches/cane/walker 0 No Furniture 0 No Intravenous therapy Access/Saline/Heparin Lock 0 No Gait/Transferring Normal/ bed rest/ wheelchair 0 Yes Weak (short steps with or without shuffle, stooped  but able to lift head while walking, may seek 0 No support from furniture) Impaired (short steps with shuffle, may have difficulty arising from chair, head down, impaired 0 No balance) Mental Status Oriented to own ability 0 Yes Electronic Signature(s) Signed: 02/15/2021 5:26:09 PM By: Antonieta Iba Entered By: Antonieta Iba on 02/15/2021 13:31:48 -------------------------------------------------------------------------------- Foot Assessment Details Patient Name: Date of Service: Joseph Ortega 02/15/2021 1:15 PM Medical Record Number: [id number] Patient Account Number: [id number] Date of Birth/Sex: Treating RN: Sep 08, 1955 (65 y.o. Lytle Michaels Primary Care Ad Guttman: Dennis Bast Other Clinician: Referring Clothilde Tippetts: Treating Hawraa Stambaugh/Extender: Vladimir Faster, Benjie Karvonen Weeks in Treatment: 0 Foot Assessment Items Site Locations + = Sensation present, - = Sensation absent, C = Callus, U = Ulcer R = Redness, W = Warmth, M = Maceration, PU = Pre-ulcerative lesion F = Fissure, S = Swelling, D = Dryness Assessment Right: Left: Other Deformity: No No Prior Foot Ulcer: No No Prior Amputation: No No Charcot Joint: No No Ambulatory Status: Ambulatory Without Help Gait: Steady Electronic Signature(s) Signed:  02/15/2021 5:26:09 PM By: Antonieta Iba Entered By: Antonieta Iba on 02/15/2021 13:35:47 -------------------------------------------------------------------------------- Nutrition Risk Screening Details Patient Name: Date of Service: DASCHEL, ROUGHTON 02/15/2021 1:15 PM Medical Record Number: [id number] Patient Account Number: [id number] Date of Birth/Sex: Treating RN: September 20, 1955 (65 y.o. Lytle Michaels Primary Care Paulette Lynch: Dennis Bast Other Clinician: Referring Lavere Stork: Treating Megean Fabio/Extender: Vladimir Faster, Benjie Karvonen Weeks in Treatment: 0 Height (in): 72 Weight (lbs): 284 Body Mass Index (BMI): 38.5 Nutrition Risk Screening Items Score Screening NUTRITION RISK SCREEN: I have an illness or condition that made me change the kind and/or amount of food I eat 0 No I eat fewer than two meals per day 0 No I eat few fruits and vegetables, or milk products 0 No I have three or more drinks of beer, liquor or wine almost every day 0 No I have tooth or mouth problems that make it hard for me to eat 0 No I don't always have enough money to buy the food I need 0 No I eat alone most of the time 0 No I take three or more different prescribed or over-the-counter drugs a day 1 Yes Without wanting to, I have lost or gained 10 pounds in the last six months 0 No I am not always physically able to shop, cook and/or feed myself 0 No Nutrition Protocols Good Risk Protocol 0 No interventions needed Moderate Risk Protocol High Risk Proctocol Risk Level: Good Risk Score: 1 Electronic Signature(s) Signed: 02/15/2021 5:26:09 PM By: Antonieta Iba Entered By: Antonieta Iba on 02/15/2021 13:32:04

## 2021-02-15 NOTE — Progress Notes (Signed)
Joseph, Ortega ([id number]) Visit Report for 02/15/2021 Allergy List Details Patient Name: Date of Service: Joseph, Ortega 02/15/2021 1:15 PM Medical Record Number: [id number] Patient Account Number: [id number] Date of Birth/Sex: Treating RN: [birth year] Ortega (65 y.o. Joseph Ortega Primary Care Joseph Ortega: Joseph Ortega Other Clinician: Referring Joseph Ortega: Treating Joseph Ortega/Extender: Joseph Ortega, Joseph Ortega Weeks in Treatment: 0 Allergies Active Allergies No Known Allergies Allergy Notes Electronic Signature(s) Signed: 02/15/2021 5:26:09 PM By: Antonieta Iba Entered By: Antonieta Iba on 02/15/2021 13:21:42 -------------------------------------------------------------------------------- Arrival Information Details Patient Name: Date of Service: Joseph, Ortega 02/15/2021 1:15 PM Medical Record Number: [id number] Patient Account Number: [id number] Date of Birth/Sex: Treating RN: [birth year] Ortega (65 y.o. Joseph Ortega Primary Care Joseph Ortega: Joseph Ortega Other Clinician: Referring Joseph Ortega: Treating Joseph Ortega/Extender: Joseph Ortega in Treatment: 0 Visit Information Patient Arrived: Ambulatory Arrival Time: 13:16 Transfer Assistance: None Patient Identification Verified: Yes Secondary Verification Process Completed: Yes Patient Requires Transmission-Based Precautions: No Patient Has Alerts: Yes Patient Alerts: R ABI=Joseph Ortega Electronic Signature(s) Signed: 02/15/2021 5:26:09 PM By: Antonieta Iba Entered By: Antonieta Iba on 02/15/2021 14:19:36 -------------------------------------------------------------------------------- Clinic Level of Care Assessment Details Patient Name: Date of Service: Joseph Ortega 02/15/2021 1:15 PM Medical Record Number: [id number] Patient Account Number: [id number] Date of Birth/Sex: Treating RN: Joseph Ortega (65 y.o. Joseph Ortega Primary Care Abdifatah Colquhoun: Joseph Ortega Other Clinician: Referring Joseph Ortega: Treating  Joseph Ortega/Extender: Joseph Ortega Weeks in Treatment: 0 Clinic Level of Care Assessment Items TOOL 2 Quantity Score X- 1 0 Use when only an EandM is performed on the INITIAL visit ASSESSMENTS - Nursing Assessment / Reassessment X- 1 20 General Physical Exam (combine w/ comprehensive assessment (listed just below) when performed on new pt. evals) X- 1 25 Comprehensive Assessment (HX, ROS, Risk Assessments, Wounds Hx, etc.) ASSESSMENTS - Wound and Skin A ssessment / Reassessment []  - 0 Simple Wound Assessment / Reassessment - one wound X- 2 5 Complex Wound Assessment / Reassessment - multiple wounds []  - 0 Dermatologic / Skin Assessment (not related to wound area) ASSESSMENTS - Ostomy and/or Continence Assessment and Care []  - 0 Incontinence Assessment and Management []  - 0 Ostomy Care Assessment and Management (repouching, etc.) PROCESS - Coordination of Care []  - 0 Simple Patient / Family Education for ongoing care X- 1 20 Complex (extensive) Patient / Family Education for ongoing care X- 1 10 Staff obtains , Records, T Results / Process Orders est []  - 0 Staff telephones HHA, Nursing Homes / Clarify orders / etc []  - 0 Routine Transfer to another Facility (non-emergent condition) []  - 0 Routine Hospital Admission (non-emergent condition) []  - 0 New Admissions / / Ordering NPWT Apligraf, etc. , []  - 0 Emergency Hospital Admission (emergent condition) []  - 0 Simple Discharge Coordination []  - 0 Complex (extensive) Discharge Coordination PROCESS - Special Needs []  - 0 Pediatric / Minor Patient Management []  - 0 Isolation Patient Management []  - 0 Hearing / Language / Visual special needs []  - 0 Assessment of Community assistance (transportation, D/C planning, etc.) []  - 0 Additional assistance / Altered mentation []  - 0 Support Surface(s) Assessment (bed, cushion, seat, etc.) INTERVENTIONS - Wound Cleansing /  Measurement X- 1 5 Wound Imaging (photographs - any number of wounds) []  - 0 Wound Tracing (instead of photographs) []  - 0 Simple Wound Measurement - one wound X- 2 5 Complex Wound Measurement - multiple wounds []  - 0 Simple Wound Cleansing - one wound X- 2 5 Complex Wound Cleansing - multiple wounds  INTERVENTIONS - Wound Dressings  - 0 Small Wound Dressing one or multiple wounds  - 0 Medium Wound Dressing one or multiple wounds X- 2 20 Large Wound Dressing one or multiple wounds  - 0 Application of Medications - injection INTERVENTIONS - Miscellaneous  - 0 External ear exam  - 0 Specimen Collection (cultures, biopsies, blood, body fluids, etc.)  - 0 Specimen(s) / Culture(s) sent or taken to Lab for analysis  - 0 Patient Transfer (multiple staff / Nurse, adult / Similar devices)  - 0 Simple Staple / Suture removal (25 or less)  - 0 Complex Staple / Suture removal (26 or more)  - 0 Hypo / Hyperglycemic Management (close monitor of Blood Glucose) X- 1 15 Ankle / Brachial Index (ABI) - do not check if billed separately Has the patient been seen at the hospital within the last three years: Yes Total Score: 165 Level Of Care: New/Established - Level 5 Electronic Signature(s) Signed: 02/15/2021 5:26:09 PM By: Antonieta Iba Entered By: Antonieta Iba on 02/15/2021 14:25:28 -------------------------------------------------------------------------------- Compression Therapy Details Patient Name: Date of Service: Joseph, Ortega 02/15/2021 1:15 PM Medical Record Number: [id number] Patient Account Number: [id number] Date of Birth/Sex: Treating RN: [birth year] Ortega (65 y.o. Joseph Ortega Primary Care Joseph Ortega: Joseph Ortega Other Clinician: Referring Joseph Ortega: Treating Joseph Ortega/Extender: Joseph Ortega Weeks in Treatment: 0 Compression Therapy Performed for Wound Assessment: Wound #1 Right,Lateral Lower Leg Performed By: Clinician Antonieta Iba, RN Compression Type: Henriette Combs Post Procedure Diagnosis Same as Pre-procedure Electronic Signature(s) Signed: 02/15/2021 5:26:09 PM By: Antonieta Iba Entered By: Antonieta Iba on 02/15/2021 14:20:28 -------------------------------------------------------------------------------- Compression Therapy Details Patient Name: Date of Service: Joseph, Ortega 02/15/2021 1:15 PM Medical Record Number: [id number] Patient Account Number: [id number] Date of Birth/Sex: Treating RN: [birth year] (65 y.o. Joseph Ortega Primary Care Idora Brosious: Joseph Ortega Other Clinician: Referring Velena Keegan: Treating Samai Corea/Extender: Joseph Ortega Weeks in Treatment: 0 Compression Therapy Performed for Wound Assessment: Wound #2 Left,Anterior Lower Leg Performed By: Clinician Antonieta Iba, RN Compression Type: Henriette Combs Post Procedure Diagnosis Same as Pre-procedure Electronic Signature(s) Signed: 02/15/2021 5:26:09 PM By: Antonieta Iba Entered By: Antonieta Iba on 02/15/2021 14:20:28 -------------------------------------------------------------------------------- Encounter Discharge Information Details Patient Name: Date of Service: Joseph, Ortega 02/15/2021 1:15 PM Medical Record Number: [id number] Patient Account Number: [id number] Date of Birth/Sex: Treating RN: Or[birth year] (65 y.o. Joseph Ortega Primary Care Lerae Langham: Joseph Ortega Other Clinician: Referring Sharde Gover: Treating Debbi Strandberg/Extender: Joseph Ortega in Treatment: 0 Encounter Discharge Information Items Discharge Condition: Stable Ambulatory Status: Ambulatory Discharge Destination: Home Transportation: Private Auto Schedule Follow-up Appointment: Yes Clinical Summary of Care: Provided on 02/15/2021 Form Type Recipient Paper Patient Patient Electronic Signature(s) Signed: 02/15/2021 5:26:09 PM By: Antonieta Iba Entered By: Antonieta Iba on 02/15/2021  14:27:11 -------------------------------------------------------------------------------- Lower Extremity Assessment Details Patient Name: Date of Service: Joseph, Ortega 02/15/2021 1:15 PM Medical Record Number: [id number] Patient Account Number: [id number] Date of Birth/Sex: Treating RN: [birth year] (65 y.o. Joseph Ortega Primary Care Xareni Kelch: Joseph Ortega Other Clinician: Referring Raun Routh: Treating Korey Prashad/Extender: Joseph Ortega Weeks in Treatment: 0 Edema Assessment Assessed: Kyra Searles: Yes] Franne Forts: Yes] Edema: [Left: Yes] [Right: Yes] Calf Left: Right: Point of Measurement: 33 cm From Medial Instep 48 cm 46.4 cm Ankle Left: Right: Point of Measurement: 10 cm From Medial Instep 33 cm 32.5 cm Vascular Assessment Pulses: Dorsalis Pedis Palpable: [Left:Yes] [Right:Yes] Blood Pressure: Brachial: [Right:180] Ankle: [Right:Dorsalis Pedis: 140 0.78] Notes Right ABI=Non Compressible Electronic Signature(s) Signed: 02/15/2021 5:26:09  PM By: Bo Mcclintock By: Antonieta Iba on 02/15/2021 14:19:15 -------------------------------------------------------------------------------- Multi-Disciplinary Care Plan Details Patient Name: Date of Service: Joseph, Ortega 02/15/2021 1:15 PM Medical Record Number: [id number] Patient Account Number: [id number] Date of Birth/Sex: Treating RN: [birth year] (65 y.o. Joseph Ortega Primary Care Yanet Balliet: Joseph Ortega Other Clinician: Referring Marcella Charlson: Treating Marlet Korte/Extender: Joseph Ortega Weeks in Treatment: 0 Active Inactive Nutrition Nursing Diagnoses: Impaired glucose control: actual or potential Goals: Patient/caregiver verbalizes understanding of need to maintain therapeutic glucose control per primary care physician Date Initiated: 02/15/2021 Target Resolution Date: 03/15/2021 Goal Status: Active Interventions: Assess HgA1c results as ordered upon admission and as needed Provide  education on elevated blood sugars and impact on wound healing Notes: Wound/Skin Impairment Nursing Diagnoses: Impaired tissue integrity Goals: Patient/caregiver will verbalize understanding of skin care regimen Date Initiated: 02/15/2021 Target Resolution Date: 03/15/2021 Goal Status: Active Ulcer/skin breakdown will have a volume reduction of 30% by week 4 Date Initiated: 02/15/2021 Target Resolution Date: 03/15/2021 Goal Status: Active Interventions: Assess patient/caregiver ability to obtain necessary supplies Assess patient/caregiver ability to perform ulcer/skin care regimen upon admission and as needed Assess ulceration(s) every visit Provide education on ulcer and skin care Treatment Activities: Topical wound management initiated : 02/15/2021 Notes: Electronic Signature(s) Signed: 02/15/2021 5:26:09 PM By: Antonieta Iba Entered By: Antonieta Iba on 02/15/2021 13:57:35 -------------------------------------------------------------------------------- Pain Assessment Details Patient Name: Date of Service: Joseph, Ortega 02/15/2021 1:15 PM Medical Record Number: [id number] Patient Account Number: [id number] Date of Birth/Sex: Treating RN: [birth year] (65 y.o. Joseph Ortega Primary Care Kade Demicco: Joseph Ortega Other Clinician: Referring Maleni Seyer: Treating Miriam Kestler/Extender: Joseph Ortega Weeks in Treatment: 0 Active Problems Location of Pain Severity and Description of Pain Patient Has Paino Yes Site Locations Pain Location: Pain in Ulcers Duration of the Pain. Constant / Intermittento Intermittent Rate the pain. Current Pain Level: 7 Character of Pain Describe the Pain: Sharp, Stabbing Pain Management and Medication Current Pain Management: Medication: Yes Cold Application: No Rest: Yes Massage: No Activity: No T.E.N.S.: No Heat Application: No Leg drop or elevation: No Is the Current Pain Management Adequate: Adequate How does your  wound impact your activities of daily livingo Sleep: Yes Bathing: No Appetite: No Relationship With Others: No Bladder Continence: No Emotions: No Bowel Continence: No Work: No Toileting: No Drive: No Dressing: No Hobbies: No Electronic Signature(s) Signed: 02/15/2021 5:26:09 PM By: Antonieta Iba Entered By: Antonieta Iba on 02/15/2021 13:32:49 -------------------------------------------------------------------------------- Patient/Caregiver Education Details Patient Name: Date of Service: Joseph Ortega 11/30/2022andnbsp1:15 PM Medical Record Number: [id number] Patient Account Number: [id number] Date of Birth/Gender: Treating RN: [birth year] Ortega (65 y.o. Joseph Ortega Primary Care Physician: Joseph Ortega Other Clinician: Referring Physician: Treating Physician/Extender: Joseph Ortega in Treatment: 0 Education Assessment Education Provided To: Patient Education Topics Provided Elevated Blood Sugar/ Impact on Healing: Methods: Explain/Verbal, Printed Responses: State content correctly Venous: Methods: Explain/Verbal, Printed Responses: State content correctly Wound/Skin Impairment: Methods: Explain/Verbal, Printed Responses: State content correctly Electronic Signature(s) Signed: 02/15/2021 5:26:09 PM By: Antonieta Iba Entered By: Antonieta Iba on 02/15/2021 13:57:58 -------------------------------------------------------------------------------- Wound Assessment Details Patient Name: Date of Service: Joseph, Ortega 02/15/2021 1:15 PM Medical Record Number: [id number] Patient Account Number: [id number] Date of Birth/Sex: Treating RN: [birth year] (65 y.o. Joseph Ortega Primary Care Obryan Radu: Joseph Ortega Other Clinician: Referring Sircharles Holzheimer: Treating Becki Mccaskill/Extender: Joseph Ortega Weeks in Treatment: 0 Wound Status Wound Number: 1 Primary Venous Leg Ulcer Etiology: Wound Location: Right, Lateral Lower Leg Wound  Status:  Open Wounding Event: Gradually Appeared Comorbid Sleep Apnea, Hypertension, Type II Diabetes, Osteoarthritis, Date Acquired: 02/15/2021 History: Neuropathy Weeks Of Treatment: 0 Clustered Wound: No Photos Wound Measurements Length: (cm) 2 Width: (cm) 1.7 Depth: (cm) 0.1 Area: (cm) 2.67 Volume: (cm) 0.267 % Reduction in Area: 0% % Reduction in Volume: 0% Epithelialization: None Tunneling: No Undermining: No Wound Description Classification: Full Thickness Without Exposed Support Structures Wound Margin: Distinct, outline attached Exudate Amount: Medium Exudate Type: Serosanguineous Exudate Color: red, brown Foul Odor After Cleansing: No Slough/Fibrino Yes Wound Bed Granulation Amount: Large (67-100%) Exposed Structure Granulation Quality: Red Fascia Exposed: No Necrotic Amount: Small (1-33%) Fat Layer (Subcutaneous Tissue) Exposed: Yes Necrotic Quality: Adherent Slough Tendon Exposed: No Muscle Exposed: No Joint Exposed: No Bone Exposed: No Electronic Signature(s) Signed: 02/15/2021 5:26:09 PM By: Antonieta Iba Entered By: Antonieta Iba on 02/15/2021 13:53:15 -------------------------------------------------------------------------------- Wound Assessment Details Patient Name: Date of Service: Joseph, Ortega 02/15/2021 1:15 PM Medical Record Number: [id number] Patient Account Number: [id number] Date of Birth/Sex: Treating RN: Orte[birth year] (65 y.o. Joseph Ortega Primary Care Adrina Armijo: Joseph Ortega Other Clinician: Referring Jawaun Celmer: Treating Lynzie Cliburn/Extender: Joseph Ortega Weeks in Treatment: 0 Wound Status Wound Number: 2 Primary Trauma, Other Etiology: Wound Location: Left, Anterior Lower Leg Wound Status: Open Wounding Event: Trauma Comorbid Sleep Apnea, Hypertension, Type II Diabetes, Osteoarthritis, Date Acquired: 01/18/2021 History: Neuropathy Weeks Of Treatment: 0 Clustered Wound: No Photos Wound  Measurements Length: (cm) 3.3 Width: (cm) 2.5 Depth: (cm) 0.2 Area: (cm) 6.48 Volume: (cm) 1.296 % Reduction in Area: 0% % Reduction in Volume: 0% Epithelialization: None Tunneling: No Undermining: No Wound Description Classification: Full Thickness Without Exposed Support Structures Wound Margin: Distinct, outline attached Exudate Amount: Medium Exudate Type: Serosanguineous Exudate Color: red, brown Foul Odor After Cleansing: No Slough/Fibrino Yes Wound Bed Granulation Amount: Large (67-100%) Exposed Structure Granulation Quality: Red Fascia Exposed: No Necrotic Amount: Small (1-33%) Fat Layer (Subcutaneous Tissue) Exposed: Yes Necrotic Quality: Adherent Slough Tendon Exposed: No Muscle Exposed: No Joint Exposed: No Bone Exposed: No Treatment Notes Wound #2 (Lower Leg) Wound Laterality: Left, Anterior Cleanser Soap and Water Discharge Instruction: May shower and wash wound with dial antibacterial soap and water prior to dressing change. Wound Cleanser Discharge Instruction: Cleanse the wound with wound cleanser prior to applying a clean dressing using gauze sponges, not tissue or cotton balls. Peri-Wound Care Topical Primary Dressing KerraCel Ag Gelling Fiber Dressing, 4x5 in (silver alginate) Discharge Instruction: Apply silver alginate to wound bed as instructed Secondary Dressing ABD Pad, 5x9 Discharge Instruction: Apply over primary dressing as directed. Secured With Compression Wrap Unnaboot w/Calamine, 4x10 (in/yd) Discharge Instruction: Apply Unnaboot as directed. Coban Self-Adherent Wrap 4x5 (in/yd) Discharge Instruction: Apply over Kerlix as directed. Compression Stockings Add-Ons Electronic Signature(s) Signed: 02/15/2021 5:26:09 PM By: Antonieta Iba Entered By: Antonieta Iba on 02/15/2021 13:53:48 -------------------------------------------------------------------------------- Vitals Details Patient Name: Date of Service: Joseph, Ortega  02/15/2021 1:15 PM Medical Record Number: [id number] Patient Account Number: [id number] Date of Birth/Sex: Treating RN: [birth year] (65 y.o. Joseph Ortega Primary Care Denina Rieger: Joseph Ortega Other Clinician: Referring Kaydan Wilhoite: Treating Itzael Liptak/Extender: Joseph Ortega, Joseph Ortega Weeks in Treatment: 0 Vital Signs Time Taken: 13:22 Temperature (F): 98.4 Height (in): 72 Pulse (bpm): 70 Source: Stated Respiratory Rate (breaths/min): 18 Weight (lbs): 284 Blood Pressure (mmHg): 180/100 Source: Stated Reference Range: 80 - 120 mg / dl Body Mass Index (BMI): 38.5 Electronic Signature(s) Signed: 02/15/2021 5:26:09 PM By: Antonieta Iba Entered By: Antonieta Iba on 02/15/2021 13:54:48

## 2021-02-22 ENCOUNTER — Other Ambulatory Visit: Payer: Self-pay

## 2021-02-22 ENCOUNTER — Encounter (HOSPITAL_BASED_OUTPATIENT_CLINIC_OR_DEPARTMENT_OTHER): Payer: BC Managed Care – PPO | Attending: Physician Assistant | Admitting: Physician Assistant

## 2021-02-22 DIAGNOSIS — I872 Venous insufficiency (chronic) (peripheral): Secondary | ICD-10-CM | POA: Diagnosis not present

## 2021-02-22 DIAGNOSIS — L97822 Non-pressure chronic ulcer of other part of left lower leg with fat layer exposed: Secondary | ICD-10-CM | POA: Diagnosis not present

## 2021-02-22 DIAGNOSIS — E1140 Type 2 diabetes mellitus with diabetic neuropathy, unspecified: Secondary | ICD-10-CM | POA: Diagnosis not present

## 2021-02-22 DIAGNOSIS — L97812 Non-pressure chronic ulcer of other part of right lower leg with fat layer exposed: Secondary | ICD-10-CM | POA: Diagnosis not present

## 2021-02-22 DIAGNOSIS — E11622 Type 2 diabetes mellitus with other skin ulcer: Secondary | ICD-10-CM | POA: Insufficient documentation

## 2021-02-22 NOTE — Progress Notes (Addendum)
LARSON, LIMONES ([id number]) Visit Report for 02/22/2021 Chief Complaint Document Details Patient Name: Date of Service: Joseph Ortega, Joseph Ortega 02/22/2021 2:45 PM Medical Record Number: [id number] Patient Account Number: [id number] Date of Birth/Sex: Treating RN: 1956/01/19 (65 y.o. [name] Primary Care Provider: Dennis Bast Other Clinician: Referring Provider: Treating Provider/Extender: Lorin Mercy Weeks in Treatment: 1 Information Obtained from: Patient Chief Complaint Bilateral LE Ulcers Electronic Signature(s) Signed: 02/22/2021 3:09:51 PM By: Lenda Kelp PA-C Entered By: Lenda Kelp on 02/22/2021 15:09:51 -------------------------------------------------------------------------------- HPI Details Patient Name: Date of Service: Joseph Ortega 02/22/2021 2:45 PM Medical Record Number: [id number] Patient Account Number: [id number] Date of Birth/Sex: Treating RN: 15-Sep-1955 (65 y.o. [name] Primary Care Provider: Dennis Bast Other Clinician: Referring Provider: Treating Provider/Extender: Lorin Mercy Weeks in Treatment: 1 History of Present Illness HPI Description: 02/15/2021 upon evaluation today patient presents for initial inspection here in our clinic concerning wounds over the bilateral lower extremities. He subsequently has previously gone to the [facility] wound care center when he has had previous issues and was very pleased with service there. Subsequently however he did have issues with travel times about an hour for him to drive there and back subsequently he was coming here this time to try to work things out a little bit more conveniently for him he also drives to [employer] every day for work. Subsequently the patient's hemoglobin A1c is 10.3. He in the past has done well with silver alginate and Unna boot dressing. He is currently on doxycycline for 10 days and has been using his own compression socks. He  does have a history of chronic venous insufficiency and diabetes mellitus type 2. 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 02/22/2021 upon inspection patient's wound bed actually showed signs of significant improvement. I am actually very pleased with where we stand today. I do not see any signs of infection which is great news. No fevers, chills, nausea, vomiting, or diarrhea. Electronic Signature(s) Signed: 02/22/2021 3:24:18 PM By: Lenda Kelp PA-C Entered By: Lenda Kelp on 02/22/2021 15:24:18 -------------------------------------------------------------------------------- Physical Exam Details Patient Name: Date of Service: Joseph Ortega 02/22/2021 2:45 PM Medical Record Number: [id number] Patient Account Number: [id number] Date of Birth/Sex: Treating RN: December 12, 1955 (65 y.o. [name] Primary Care Provider: Dennis Bast Other Clinician: Referring Provider: Treating Provider/Extender: Lorin Mercy Weeks in Treatment: 1 Constitutional Well-nourished and well-hydrated in no acute distress. Respiratory normal breathing without difficulty. Psychiatric this patient is able to make decisions and demonstrates good insight into disease process. Alert and Oriented x 3. pleasant and cooperative. Notes Upon inspection patient's wound bed actually showed signs of fairly good granulation epithelization at this point. Fortunately I do not see any signs of infection currently which is great and overall I think the patient is making good progress. Electronic Signature(s) Signed: 02/22/2021 3:25:49 PM By: Lenda Kelp PA-C Entered By: Lenda Kelp on 02/22/2021  15:25:49 -------------------------------------------------------------------------------- Physician Orders Details Patient Name: Date of Service: Joseph Ortega 02/22/2021 2:45 PM Medical Record Number: [id number] Patient Account Number: [id number] Date of Birth/Sex: Treating RN: December 16, 1955 (65 y.o. [name] Primary Care Provider: Dennis Bast  Other Clinician: Referring Provider: Treating Provider/Extender: Charlott Rakes in Treatment: 1 Verbal / Phone Orders: No Diagnosis Coding ICD-10 Coding Code Description I87.2 Venous insufficiency (chronic) (peripheral) L97.812 Non-pressure chronic ulcer of other part of right lower leg with fat layer exposed L97.822 Non-pressure chronic ulcer of other part of left lower leg with fat layer exposed E11.622 Type 2 diabetes mellitus with other skin ulcer Follow-up Appointments ppointment in 1 week. - with Leonard Schwartz Return A Bathing/ Shower/ Hygiene May shower and wash wound with soap and water. Edema Control - Lymphedema / SCD / Other Bilateral Lower Extremities Elevate legs to the level of the heart or above for 30 minutes daily and/or when sitting, a frequency of: Avoid standing for long periods of time. Exercise regularly Moisturize legs daily. Compression stocking or Garment 20-30 mm/Hg pressure to: - both legs daily Additional Orders / Instructions Follow Nutritious Diet - Monitor blood sugar-keep controlled to help with wound healing Wound Treatment Wound #1 - Lower Leg Wound Laterality: Right, Lateral Cleanser: Soap and Water Every Other Day/30 Days Discharge Instructions: May shower and wash wound with dial antibacterial soap and water prior to dressing change. Cleanser: Wound Cleanser Every Other Day/30 Days Discharge Instructions: Cleanse the wound with wound cleanser prior to applying a clean dressing using gauze sponges, not tissue or cotton balls. Prim Dressing: KerraCel Ag Gelling Fiber Dressing, 4x5 in  (silver alginate) Every Other Day/30 Days ary Discharge Instructions: Apply silver alginate to wound bed as instructed Secondary Dressing: Zetuvit Plus Silicone Border Dressing 4x4 (in/in) Every Other Day/30 Days Discharge Instructions: Apply silicone border over primary dressing as directed. Wound #2 - Lower Leg Wound Laterality: Left, Anterior Cleanser: Soap and Water Every Other Day/30 Days Discharge Instructions: May shower and wash wound with dial antibacterial soap and water prior to dressing change. Cleanser: Wound Cleanser Every Other Day/30 Days Discharge Instructions: Cleanse the wound with wound cleanser prior to applying a clean dressing using gauze sponges, not tissue or cotton balls. Prim Dressing: KerraCel Ag Gelling Fiber Dressing, 4x5 in (silver alginate) Every Other Day/30 Days ary Discharge Instructions: Apply silver alginate to wound bed as instructed Secondary Dressing: Zetuvit Plus Silicone Border Dressing 4x4 (in/in) Every Other Day/30 Days Discharge Instructions: Apply silicone border over primary dressing as directed. Electronic Signature(s) Signed: 02/22/2021 4:49:37 PM By: Zenaida Deed RN, BSN Signed: 02/22/2021 4:51:36 PM By: Lenda Kelp PA-C Entered By: Zenaida Deed on 02/22/2021 15:19:13 -------------------------------------------------------------------------------- Problem List Details Patient Name: Date of Service: LINK, BURGESON 02/22/2021 2:45 PM Medical Record Number: [id number] Patient Account Number: [id number] Date of Birth/Sex: Treating RN: [birth year] (65 y.o. [name] Primary Care Provider: Dennis Bast Other Clinician: Referring Provider: Treating Provider/Extender: Charlott Rakes in Treatment: 1 Active Problems ICD-10 Encounter Code Description Active Date MDM Diagnosis I87.2 Venous insufficiency (chronic) (peripheral) 02/15/2021 No Yes L97.812 Non-pressure chronic ulcer of other part of right lower  leg with fat layer 02/15/2021 No Yes exposed L97.822 Non-pressure chronic ulcer of other part of left lower leg with fat layer 02/15/2021 No Yes exposed E11.622 Type 2 diabetes mellitus with other skin ulcer 02/15/2021 No Yes Inactive Problems Resolved Problems Electronic Signature(s) Signed: 02/22/2021 3:09:37 PM By: Lenda Kelp PA-C Entered By: Lenda Kelp on 02/22/2021 15:09:37 -------------------------------------------------------------------------------- Progress Note Details Patient Name: Date of Service: KWESI, SANGHA 02/22/2021 2:45 PM Medical Record Number: [id number] Patient Account Number: [id number] Date of Birth/Sex: Treating RN: 1956/03/18 (65 y.o. [name] Primary Care Provider:  Dennis Bast Other Clinician: Referring Provider: Treating Provider/Extender: Lorin Mercy Weeks in Treatment: 1 Subjective Chief Complaint Information obtained from Patient Bilateral LE Ulcers History of Present Illness (HPI) 02/15/2021 upon evaluation today patient presents for initial inspection here in our clinic concerning wounds over the bilateral lower extremities. He subsequently has previously gone to the [facility] wound care center when he has had previous issues and was very pleased with service there. Subsequently however he did have issues with travel times about an hour for him to drive there and back subsequently he was coming here this time to try to work things out a little bit more conveniently for him he also drives to [employer] every day for work. Subsequently the patient's hemoglobin A1c is 10.3. He in the past has done well with silver alginate and Unna boot dressing. He is currently on doxycycline for 10 days and has been using his own compression socks. He does have a history of chronic venous insufficiency and diabetes mellitus type 2. 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 02/22/2021 upon inspection patient's wound bed actually showed signs of significant improvement. I am actually very pleased with where we stand today. I do not see any signs of infection which is great news. No fevers, chills, nausea, vomiting, or diarrhea. Objective Constitutional Well-nourished and well-hydrated in no acute distress. Vitals Time Taken: 2:42 PM, Height: 72 in, Source: Stated, Weight: 284 lbs, Source: Stated, BMI: 38.5, Temperature: 98.1 F, Pulse: 80 bpm, Respiratory Rate: 18 breaths/min, Blood Pressure: 174/82 mmHg, Capillary Blood Glucose: 173 mg/dl. General Notes: glucose per pt report yesterday evening Respiratory normal breathing without difficulty. Psychiatric this patient is able to make decisions and demonstrates good insight into disease process. Alert and Oriented x 3. pleasant and cooperative. General Notes: Upon inspection patient's wound bed actually showed signs of fairly good granulation epithelization at this point. Fortunately I do not see any signs of infection currently which is great and overall I think the patient is making good progress. Integumentary (Hair, Skin) Wound #1 status is Open. Original cause of wound was Gradually Appeared. The date acquired was: 01/18/2021. The wound has been in treatment 1 weeks. The wound is located on the Right,Lateral Lower Leg. The wound measures 1.5cm length x 1.1cm width x 0.1cm depth; 1.296cm^2 area and 0.13cm^3 volume. There is Fat Layer (Subcutaneous Tissue) exposed. There is no tunneling or undermining noted. There is a medium amount of purulent drainage noted. The wound margin is flat and intact. There is large (67-100%) red granulation within the wound bed. There is a small (1-33%) amount of necrotic tissue within the wound bed including  Adherent Slough. Wound #2 status is Open. Original cause of wound was Trauma. The date acquired was: 01/18/2021. The wound has been in treatment 1 weeks. The wound is located on the Left,Anterior Lower Leg. The wound measures 2cm length x 1.2cm width x 0.1cm depth; 1.885cm^2 area and 0.188cm^3 volume. There is Fat Layer (Subcutaneous Tissue) exposed. There is no tunneling or undermining noted. There is a medium amount of purulent drainage noted. The wound margin is distinct with the outline attached to the wound base. There is medium (34-66%) red granulation within  the wound bed. There is a medium (34-66%) amount of necrotic tissue within the wound bed including Adherent Slough. Assessment Active Problems ICD-10 Venous insufficiency (chronic) (peripheral) Non-pressure chronic ulcer of other part of right lower leg with fat layer exposed Non-pressure chronic ulcer of other part of left lower leg with fat layer exposed Type 2 diabetes mellitus with other skin ulcer Plan Follow-up Appointments: Return Appointment in 1 week. - with Luana Shu Shower/ Hygiene: May shower and wash wound with soap and water. Edema Control - Lymphedema / SCD / Other: Elevate legs to the level of the heart or above for 30 minutes daily and/or when sitting, a frequency of: Avoid standing for long periods of time. Exercise regularly Moisturize legs daily. Compression stocking or Garment 20-30 mm/Hg pressure to: - both legs daily Additional Orders / Instructions: Follow Nutritious Diet - Monitor blood sugar-keep controlled to help with wound healing WOUND #1: - Lower Leg Wound Laterality: Right, Lateral Cleanser: Soap and Water Every Other Day/30 Days Discharge Instructions: May shower and wash wound with dial antibacterial soap and water prior to dressing change. Cleanser: Wound Cleanser Every Other Day/30 Days Discharge Instructions: Cleanse the wound with wound cleanser prior to applying a clean dressing using  gauze sponges, not tissue or cotton balls. Prim Dressing: KerraCel Ag Gelling Fiber Dressing, 4x5 in (silver alginate) Every Other Day/30 Days ary Discharge Instructions: Apply silver alginate to wound bed as instructed Secondary Dressing: Zetuvit Plus Silicone Border Dressing 4x4 (in/in) Every Other Day/30 Days Discharge Instructions: Apply silicone border over primary dressing as directed. WOUND #2: - Lower Leg Wound Laterality: Left, Anterior Cleanser: Soap and Water Every Other Day/30 Days Discharge Instructions: May shower and wash wound with dial antibacterial soap and water prior to dressing change. Cleanser: Wound Cleanser Every Other Day/30 Days Discharge Instructions: Cleanse the wound with wound cleanser prior to applying a clean dressing using gauze sponges, not tissue or cotton balls. Prim Dressing: KerraCel Ag Gelling Fiber Dressing, 4x5 in (silver alginate) Every Other Day/30 Days ary Discharge Instructions: Apply silver alginate to wound bed as instructed Secondary Dressing: Zetuvit Plus Silicone Border Dressing 4x4 (in/in) Every Other Day/30 Days Discharge Instructions: Apply silicone border over primary dressing as directed. 1. Would recommend that we going continue with the wound care measures as before and the patient is in agreement the plan. This includes the use of silver alginate. 2. Instead of wrapping him since he is having his arterial study tomorrow I am actually can go ahead and recommend that we just have him use a sticky bandage over top of the alginate and then his compression socks. We will see where things stand next week he cannot come back Friday for a nurse visit so nonetheless we will just have to reevaluate things next week and see where we stand. We will see patient back for reevaluation in 1 week here in the clinic. If anything worsens or changes patient will contact our office for additional recommendations. Electronic Signature(s) Signed: 02/22/2021  3:26:24 PM By: Lenda Kelp PA-C Entered By: Lenda Kelp on 02/22/2021 15:26:24 -------------------------------------------------------------------------------- SuperBill Details Patient Name: Date of Service: NIQUAN, CHARNLEY 02/22/2021 Medical Record Number: [id number] Patient Account Number: [id number] Date of Birth/Sex: Treating RN: [birth year] (65 y.o. [name] Primary Care Provider: Dennis Bast Other Clinician: Referring Provider: Treating Provider/Extender: Lorin Mercy Weeks in Treatment: 1 Diagnosis Coding ICD-10 Codes Code Description I87.2 Venous insufficiency (chronic) (peripheral) L97.812 Non-pressure chronic ulcer of other part of right  lower leg with fat layer exposed L97.822 Non-pressure chronic ulcer of other part of left lower leg with fat layer exposed E11.622 Type 2 diabetes mellitus with other skin ulcer Facility Procedures CPT4 Code: [id number] Description: 99214 - WOUND CARE VISIT-LEV 4 EST PT Modifier: Quantity: 1 Physician Procedures : CPT4 Code Description Modifier [id number] - WC PHYS LEVEL 3 - EST PT ICD-10 Diagnosis Description I87.2 Venous insufficiency (chronic) (peripheral) L97.812 Non-pressure chronic ulcer of other part of right lower leg with fat layer exposed L97.822  Non-pressure chronic ulcer of other part of left lower leg with fat layer exposed E11.622 Type 2 diabetes mellitus with other skin ulcer Quantity: 1 Electronic Signature(s) Signed: 02/22/2021 3:26:37 PM By: Lenda Kelp PA-C Entered By: Lenda Kelp on 02/22/2021 15:26:37

## 2021-02-23 ENCOUNTER — Ambulatory Visit (HOSPITAL_COMMUNITY)
Admission: RE | Admit: 2021-02-23 | Discharge: 2021-02-23 | Disposition: A | Discharge status: Home / Self Care | Payer: BC Managed Care – PPO | Source: Ambulatory Visit | Attending: Physician Assistant | Admitting: Physician Assistant

## 2021-02-23 ENCOUNTER — Other Ambulatory Visit (HOSPITAL_COMMUNITY): Payer: Self-pay | Admitting: Physician Assistant

## 2021-02-23 DIAGNOSIS — L97509 Non-pressure chronic ulcer of other part of unspecified foot with unspecified severity: Secondary | ICD-10-CM | POA: Diagnosis present

## 2021-02-27 NOTE — Progress Notes (Signed)
MONTRELL, CESSNA ([id number]) Visit Report for 02/22/2021 Arrival Information Details Patient Name: Date of Service: JOANDY, BURGET 02/22/2021 2:45 PM Medical Record Number: [id number] Patient Account Number: [id number] Date of Birth/Sex: Treating RN: 1955/11/13 (65 y.o. [name] Primary Care Dajae Kizer: Dennis Bast Other Clinician: Referring Thomos Domine: Treating Katarzyna Wolven/Extender: Charlott Rakes in Treatment: 1 Visit Information History Since Last Visit Added or deleted any medications: No Patient Arrived: Ambulatory Any new allergies or adverse reactions: No Arrival Time: 14:38 Had a fall or experienced change in No Accompanied By: self activities of daily living that may affect Transfer Assistance: None risk of falls: Patient Identification Verified: Yes Signs or symptoms of abuse/neglect since last visito No Secondary Verification Process Completed: Yes Hospitalized since last visit: No Patient Requires Transmission-Based Precautions: No Implantable device outside of the clinic excluding No Patient Has Alerts: Yes cellular tissue based products placed in the center Patient Alerts: R ABI=[place] since last visit: Has Dressing in Place as Prescribed: Yes Has Compression in Place as Prescribed: Yes Pain Present Now: No Electronic Signature(s) Signed: 02/22/2021 4:49:37 PM By: Zenaida Deed RN, BSN Entered By: Zenaida Deed on 02/22/2021 14:42:04 -------------------------------------------------------------------------------- Clinic Level of Care Assessment Details Patient Name: Date of Service: DIONTAE, ROUTE 02/22/2021 2:45 PM Medical Record Number: [id number] Patient Account Number: [id number] Date of Birth/Sex: Treating RN: [birth year] (65 y.o. [name] Primary Care Akhilesh Sassone: Dennis Bast Other Clinician: Referring Micholas Drumwright: Treating Caydn Justen/Extender: Charlott Rakes in Treatment: 1 Clinic Level of Care Assessment  Items TOOL 4 Quantity Score []  - 0 Use when only an EandM is performed on FOLLOW-UP visit ASSESSMENTS - Nursing Assessment / Reassessment X- 1 10 Reassessment of Co-morbidities (includes updates in patient status) X- 1 5 Reassessment of Adherence to Treatment Plan ASSESSMENTS - Wound and Skin A ssessment / Reassessment []  - 0 Simple Wound Assessment / Reassessment - one wound X- 2 5 Complex Wound Assessment / Reassessment - multiple wounds []  - 0 Dermatologic / Skin Assessment (not related to wound area) ASSESSMENTS - Focused Assessment X- 2 5 Circumferential Edema Measurements - multi extremities []  - 0 Nutritional Assessment / Counseling / Intervention X- 1 5 Lower Extremity Assessment (monofilament, tuning fork, pulses) []  - 0 Peripheral Arterial Disease Assessment (using hand held doppler) ASSESSMENTS - Ostomy and/or Continence Assessment and Care []  - 0 Incontinence Assessment and Management []  - 0 Ostomy Care Assessment and Management (repouching, etc.) PROCESS - Coordination of Care X - Simple Patient / Family Education for ongoing care 1 15 []  - 0 Complex (extensive) Patient / Family Education for ongoing care X- 1 10 Staff obtains , Records, T Results / Process Orders est []  - 0 Staff telephones HHA, Nursing Homes / Clarify orders / etc []  - 0 Routine Transfer to another Facility (non-emergent condition) []  - 0 Routine Hospital Admission (non-emergent condition) []  - 0 New Admissions / / Ordering NPWT Apligraf, etc. , []  - 0 Emergency Hospital Admission (emergent condition) X- 1 10 Simple Discharge Coordination []  - 0 Complex (extensive) Discharge Coordination PROCESS - Special Needs []  - 0 Pediatric / Minor Patient Management []  - 0 Isolation Patient Management []  - 0 Hearing / Language / Visual special needs []  - 0 Assessment of Community assistance (transportation, D/C planning, etc.) []  - 0 Additional  assistance / Altered mentation []  - 0 Support Surface(s) Assessment (bed, cushion, seat, etc.) INTERVENTIONS - Wound Cleansing / Measurement []  - 0 Simple Wound Cleansing - one wound X-  2 5 Complex Wound Cleansing - multiple wounds X- 1 5 Wound Imaging (photographs - any number of wounds)  - 0 Wound Tracing (instead of photographs)  - 0 Simple Wound Measurement - one wound X- 2 5 Complex Wound Measurement - multiple wounds INTERVENTIONS - Wound Dressings X - Small Wound Dressing one or multiple wounds 2 10  - 0 Medium Wound Dressing one or multiple wounds  - 0 Large Wound Dressing one or multiple wounds X- 1 5 Application of Medications - topical  - 0 Application of Medications - injection INTERVENTIONS - Miscellaneous  - 0 External ear exam  - 0 Specimen Collection (cultures, biopsies, blood, body fluids, etc.)  - 0 Specimen(s) / Culture(s) sent or taken to Lab for analysis  - 0 Patient Transfer (multiple staff / Nurse, adult / Similar devices)  - 0 Simple Staple / Suture removal (25 or less)  - 0 Complex Staple / Suture removal (26 or more)  - 0 Hypo / Hyperglycemic Management (close monitor of Blood Glucose)  - 0 Ankle / Brachial Index (ABI) - do not check if billed separately X- 1 5 Vital Signs Has the patient been seen at the hospital within the last three years: Yes Total Score: 130 Level Of Care: New/Established - Level 4 Electronic Signature(s) Signed: 02/22/2021 4:49:37 PM By: Zenaida Deed RN, BSN Entered By: Zenaida Deed on 02/22/2021 15:20:04 -------------------------------------------------------------------------------- Encounter Discharge Information Details Patient Name: Date of Service: HYMIE, GORR 02/22/2021 2:45 PM Medical Record Number: [id number] Patient Account Number: [id number] Date of Birth/Sex: Treating RN: 1955/04/22 (65 y.o. [name] Primary Care Britaney Espaillat: Dennis Bast Other  Clinician: Referring Aubriee Szeto: Treating Eduar Kumpf/Extender: Charlott Rakes in Treatment: 1 Encounter Discharge Information Items Discharge Condition: Stable Ambulatory Status: Ambulatory Discharge Destination: Home Transportation: Private Auto Accompanied By: self Schedule Follow-up Appointment: Yes Clinical Summary of Care: Patient Declined Electronic Signature(s) Signed: 02/22/2021 4:49:37 PM By: Zenaida Deed RN, BSN Entered By: Zenaida Deed on 02/22/2021 15:27:15 -------------------------------------------------------------------------------- Lower Extremity Assessment Details Patient Name: Date of Service: WARNIE, BELAIR 02/22/2021 2:45 PM Medical Record Number: [id number] Patient Account Number: [id number] Date of Birth/Sex: Treating RN: 1955/07/05 (65 y.o. [name] Primary Care Ma Munoz: Dennis Bast Other Clinician: Referring Iasiah Ozment: Treating Ameriah Lint/Extender: Lorin Mercy Weeks in Treatment: 1 Edema Assessment Assessed: [Left: No] [Right: No] Edema: [Left: Yes] [Right: Yes] Calf Left: Right: Point of Measurement: 33 cm From Medial Instep 46 cm 44 cm Ankle Left: Right: Point of Measurement: 10 cm From Medial Instep 30.5 cm 29.7 cm Vascular Assessment Pulses: Dorsalis Pedis Palpable: [Left:Yes] [Right:Yes] Electronic Signature(s) Signed: 02/22/2021 4:49:37 PM By: Zenaida Deed RN, BSN Entered By: Zenaida Deed on 02/22/2021 14:48:32 -------------------------------------------------------------------------------- Multi-Disciplinary Care Plan Details Patient Name: Date of Service: TYNELL, WINCHELL 02/22/2021 2:45 PM Medical Record Number: [id number] Patient Account Number: [id number] Date of Birth/Sex: Treating RN: [birth year] (65 y.o. [name] Primary Care Tiari Andringa: Dennis Bast Other Clinician: Referring Seanpaul Preece: Treating Dajai Wahlert/Extender: Charlott Rakes in Treatment:  1 Active Inactive Nutrition Nursing Diagnoses: Impaired glucose control: actual or potential Goals: Patient/caregiver verbalizes understanding of need to maintain therapeutic glucose control per primary care physician Date Initiated: 02/15/2021 Target Resolution Date: 03/15/2021 Goal Status: Active Interventions: Assess HgA1c results as ordered upon admission and as needed Provide education on elevated blood sugars and impact on wound healing Notes: Wound/Skin Impairment Nursing Diagnoses: Impaired tissue integrity Goals: Patient/caregiver will verbalize understanding of skin care regimen Date Initiated: 02/15/2021 Target Resolution  Date: 03/15/2021 Goal Status: Active Ulcer/skin breakdown will have a volume reduction of 30% by week 4 Date Initiated: 02/15/2021 Target Resolution Date: 03/15/2021 Goal Status: Active Interventions: Assess patient/caregiver ability to obtain necessary supplies Assess patient/caregiver ability to perform ulcer/skin care regimen upon admission and as needed Assess ulceration(s) every visit Provide education on ulcer and skin care Treatment Activities: Topical wound management initiated : 02/15/2021 Notes: Electronic Signature(s) Signed: 02/22/2021 4:49:37 PM By: Zenaida Deed RN, BSN Entered By: Zenaida Deed on 02/22/2021 15:02:33 -------------------------------------------------------------------------------- Pain Assessment Details Patient Name: Date of Service: JAQUAY, POSTHUMUS 02/22/2021 2:45 PM Medical Record Number: [id number] Patient Account Number: [id number] Date of Birth/Sex: Treating RN: 09/23/1955 (65 y.o. [name] Primary Care Joeann Steppe: Dennis Bast Other Clinician: Referring Gracielynn Birkel: Treating Yalda Herd/Extender: Lorin Mercy Weeks in Treatment: 1 Active Problems Location of Pain Severity and Description of Pain Patient Has Paino No Site Locations Rate the pain. Current Pain Level: 0 Pain  Management and Medication Current Pain Management: Electronic Signature(s) Signed: 02/22/2021 4:49:37 PM By: Zenaida Deed RN, BSN Entered By: Zenaida Deed on 02/22/2021 14:47:24 -------------------------------------------------------------------------------- Patient/Caregiver Education Details Patient Name: Date of Service: Ayesha Mohair 12/7/2022andnbsp2:45 PM Medical Record Number: [id number] Patient Account Number: [id number] Date of Birth/Gender: Treating RN: 1955/11/15 (65 y.o. [name] Primary Care Physician: Dennis Bast Other Clinician: Referring Physician: Treating Physician/Extender: Charlott Rakes in Treatment: 1 Education Assessment Education Provided To: Patient Education Topics Provided Elevated Blood Sugar/ Impact on Healing: Methods: Explain/Verbal Responses: Reinforcements needed, State content correctly Venous: Methods: Explain/Verbal Responses: Reinforcements needed, State content correctly Electronic Signature(s) Signed: 02/22/2021 4:49:37 PM By: Zenaida Deed RN, BSN Entered By: Zenaida Deed on 02/22/2021 15:03:40 -------------------------------------------------------------------------------- Wound Assessment Details Patient Name: Date of Service: MANVEER, GOMES 02/22/2021 2:45 PM Medical Record Number: [id number] Patient Account Number: [id number] Date of Birth/Sex: Treating RN: 1955-05-26 (65 y.o. [name] Primary Care Roan Sawchuk: Dennis Bast Other Clinician: Referring Mohsin Crum: Treating Levie Wages/Extender: Lorin Mercy Weeks in Treatment: 1 Wound Status Wound Number: 1 Primary Venous Leg Ulcer Etiology: Wound Location: Right, Lateral Lower Leg Wound Status: Open Wounding Event: Gradually Appeared Comorbid Sleep Apnea, Hypertension, Type II Diabetes, Osteoarthritis, Date Acquired: 01/18/2021 History: Neuropathy Weeks Of Treatment: 1 Clustered Wound: No Photos Wound  Measurements Length: (cm) 1.5 Width: (cm) 1.1 Depth: (cm) 0.1 Area: (cm) 1.296 Volume: (cm) 0.13 % Reduction in Area: 51.5% % Reduction in Volume: 51.3% Epithelialization: Small (1-33%) Tunneling: No Undermining: No Wound Description Classification: Full Thickness Without Exposed Support Structures Wound Margin: Flat and Intact Exudate Amount: Medium Exudate Type: Purulent Exudate Color: yellow, brown, green Foul Odor After Cleansing: No Slough/Fibrino Yes Wound Bed Granulation Amount: Large (67-100%) Exposed Structure Granulation Quality: Red Fascia Exposed: No Necrotic Amount: Small (1-33%) Fat Layer (Subcutaneous Tissue) Exposed: Yes Necrotic Quality: Adherent Slough Tendon Exposed: No Muscle Exposed: No Joint Exposed: No Bone Exposed: No Treatment Notes Wound #1 (Lower Leg) Wound Laterality: Right, Lateral Cleanser Soap and Water Discharge Instruction: May shower and wash wound with dial antibacterial soap and water prior to dressing change. Wound Cleanser Discharge Instruction: Cleanse the wound with wound cleanser prior to applying a clean dressing using gauze sponges, not tissue or cotton balls. Peri-Wound Care Topical Primary Dressing KerraCel Ag Gelling Fiber Dressing, 4x5 in (silver alginate) Discharge Instruction: Apply silver alginate to wound bed as instructed Secondary Dressing Zetuvit Plus Silicone Border Dressing 4x4 (in/in) Discharge Instruction: Apply silicone border over primary dressing as directed. Secured With Compression Wrap Compression  Stockings Facilities manager) Signed: 02/22/2021 4:49:37 PM By: Zenaida Deed RN, BSN Signed: 02/27/2021 2:35:48 PM By: Karl Ito Entered By: Karl Ito on 02/22/2021 15:01:02 -------------------------------------------------------------------------------- Wound Assessment Details Patient Name: Date of Service: TYLEE, NEWBY 02/22/2021 2:45 PM Medical Record Number:  [id number] Patient Account Number: [id number] Date of Birth/Sex: Treating RN: 1955-09-08 (65 y.o. [name] Primary Care Korena Nass: Dennis Bast Other Clinician: Referring Jennie Hannay: Treating Akya Fiorello/Extender: Lorin Mercy Weeks in Treatment: 1 Wound Status Wound Number: 2 Primary Trauma, Other Etiology: Wound Location: Left, Anterior Lower Leg Wound Status: Open Wounding Event: Trauma Comorbid Sleep Apnea, Hypertension, Type II Diabetes, Osteoarthritis, Date Acquired: 01/18/2021 History: Neuropathy Weeks Of Treatment: 1 Clustered Wound: No Photos Wound Measurements Length: (cm) 2 Width: (cm) 1.2 Depth: (cm) 0.1 Area: (cm) 1.885 Volume: (cm) 0.188 % Reduction in Area: 70.9% % Reduction in Volume: 85.5% Epithelialization: Small (1-33%) Tunneling: No Undermining: No Wound Description Classification: Full Thickness Without Exposed Support Structures Wound Margin: Distinct, outline attached Exudate Amount: Medium Exudate Type: Purulent Exudate Color: yellow, brown, green Foul Odor After Cleansing: No Slough/Fibrino Yes Wound Bed Granulation Amount: Medium (34-66%) Exposed Structure Granulation Quality: Red Fascia Exposed: No Necrotic Amount: Medium (34-66%) Fat Layer (Subcutaneous Tissue) Exposed: Yes Necrotic Quality: Adherent Slough Tendon Exposed: No Muscle Exposed: No Joint Exposed: No Bone Exposed: No Treatment Notes Wound #2 (Lower Leg) Wound Laterality: Left, Anterior Cleanser Soap and Water Discharge Instruction: May shower and wash wound with dial antibacterial soap and water prior to dressing change. Wound Cleanser Discharge Instruction: Cleanse the wound with wound cleanser prior to applying a clean dressing using gauze sponges, not tissue or cotton balls. Peri-Wound Care Topical Primary Dressing KerraCel Ag Gelling Fiber Dressing, 4x5 in (silver alginate) Discharge Instruction: Apply silver alginate to wound bed as  instructed Secondary Dressing Zetuvit Plus Silicone Border Dressing 4x4 (in/in) Discharge Instruction: Apply silicone border over primary dressing as directed. Secured With Compression Wrap Compression Stockings Facilities manager) Signed: 02/22/2021 4:49:37 PM By: Zenaida Deed RN, BSN Signed: 02/27/2021 2:35:48 PM By: Karl Ito Entered By: Karl Ito on 02/22/2021 15:02:03 -------------------------------------------------------------------------------- Vitals Details Patient Name: Date of Service: KAIREN, HALLINAN 02/22/2021 2:45 PM Medical Record Number: [id number] Patient Account Number: [id number] Date of Birth/Sex: Treating RN: April 16, 1955 (65 y.o. [name] Primary Care Navid Lenzen: Dennis Bast Other Clinician: Referring Brahim Dolman: Treating Elyse Prevo/Extender: Lorin Mercy Weeks in Treatment: 1 Vital Signs Time Taken: 14:42 Temperature (F): 98.1 Height (in): 72 Pulse (bpm): 80 Source: Stated Respiratory Rate (breaths/min): 18 Weight (lbs): 284 Blood Pressure (mmHg): 174/82 Source: Stated Capillary Blood Glucose (mg/dl): 778 Body Mass Index (BMI): 38.5 Reference Range: 80 - 120 mg / dl Notes glucose per pt report yesterday evening Electronic Signature(s) Signed: 02/22/2021 4:49:37 PM By: Zenaida Deed RN, BSN Entered By: Zenaida Deed on 02/22/2021 14:43:30

## 2021-03-01 ENCOUNTER — Encounter (HOSPITAL_BASED_OUTPATIENT_CLINIC_OR_DEPARTMENT_OTHER): Payer: BC Managed Care – PPO | Admitting: Physician Assistant

## 2021-03-01 ENCOUNTER — Other Ambulatory Visit: Payer: Self-pay

## 2021-03-01 DIAGNOSIS — E1140 Type 2 diabetes mellitus with diabetic neuropathy, unspecified: Secondary | ICD-10-CM | POA: Diagnosis not present

## 2021-03-01 DIAGNOSIS — I872 Venous insufficiency (chronic) (peripheral): Secondary | ICD-10-CM | POA: Diagnosis not present

## 2021-03-01 DIAGNOSIS — L97812 Non-pressure chronic ulcer of other part of right lower leg with fat layer exposed: Secondary | ICD-10-CM | POA: Diagnosis not present

## 2021-03-01 DIAGNOSIS — L97822 Non-pressure chronic ulcer of other part of left lower leg with fat layer exposed: Secondary | ICD-10-CM | POA: Diagnosis not present

## 2021-03-01 DIAGNOSIS — E11622 Type 2 diabetes mellitus with other skin ulcer: Secondary | ICD-10-CM | POA: Diagnosis present

## 2021-03-01 NOTE — Progress Notes (Signed)
RISHIKESH, KHACHATRYAN ([id number]) Visit Report for 03/01/2021 Arrival Information Details Patient Name: Date of Service: Joseph Ortega, Joseph Ortega 03/01/2021 2:45 PM Medical Record Number: [id number] Patient Account Number: [id number] Date of Birth/Sex: Treating RN: [birth year] (65 y.o. [name] Primary Care Shikira Folino: Dennis Bast Other Clinician: Referring Katherleen Folkes: Treating Kierstynn Babich/Extender: Charlott Rakes in Treatment: 2 Visit Information History Since Last Visit Added or deleted any medications: No Patient Arrived: Ambulatory Any new allergies or adverse reactions: No Arrival Time: 15:03 Had a fall or experienced change in No Accompanied By: self activities of daily living that may affect Transfer Assistance: None risk of falls: Patient Identification Verified: Yes Signs or symptoms of abuse/neglect since last visito No Secondary Verification Process Completed: Yes Hospitalized since last visit: No Patient Requires Transmission-Based Precautions: No Implantable device outside of the clinic excluding No Patient Has Alerts: Yes cellular tissue based products placed in the center Patient Alerts: R ABI=[place] since last visit: Has Dressing in Place as Prescribed: No Has Compression in Place as Prescribed: No Pain Present Now: No Electronic Signature(s) Signed: 03/01/2021 5:27:53 PM By: Shawn Stall RN, BSN Entered By: Shawn Stall on 03/01/2021 15:08:58 -------------------------------------------------------------------------------- Compression Therapy Details Patient Name: Date of Service: Joseph Ortega 03/01/2021 2:45 PM Medical Record Number: [id number] Patient Account Number: [id number] Date of Birth/Sex: Treating RN: Oct 08, 1955 (65 y.o. [name] Primary Care Kylo Gavin: Dennis Bast Other Clinician: Referring Yusuf Yu: Treating Hazelee Harbold/Extender: Lorin Mercy Weeks in Treatment: 2 Compression Therapy Performed for Wound Assessment:  Wound #2 Left,Anterior Lower Leg Performed By: Clinician Shawn Stall, RN Compression Type: Three Layer Post Procedure Diagnosis Same as Pre-procedure Electronic Signature(s) Signed: 03/01/2021 5:27:53 PM By: Shawn Stall RN, BSN Entered By: Shawn Stall on 03/01/2021 15:18:30 -------------------------------------------------------------------------------- Compression Therapy Details Patient Name: Date of Service: Joseph Ortega, Joseph Ortega 03/01/2021 2:45 PM Medical Record Number: [id number] Patient Account Number: [id number] Date of Birth/Sex: Treating RN: [birth year] (65 y.o. [name] Primary Care Daryon Remmert: Dennis Bast Other Clinician: Referring Charolett Yarrow: Treating Tanicia Wolaver/Extender: Lorin Mercy Weeks in Treatment: 2 Compression Therapy Performed for Wound Assessment: Wound #1 Right,Lateral Lower Leg Performed By: Clinician Shawn Stall, RN Compression Type: Three Layer Post Procedure Diagnosis Same as Pre-procedure Electronic Signature(s) Signed: 03/01/2021 5:27:53 PM By: Shawn Stall RN, BSN Entered By: Shawn Stall on 03/01/2021 15:18:30 -------------------------------------------------------------------------------- Encounter Discharge Information Details Patient Name: Date of Service: Joseph Ortega, Joseph Ortega 03/01/2021 2:45 PM Medical Record Number: [id number] Patient Account Number: [id number] Date of Birth/Sex: Treating RN: 06/23/1955 (65 y.o. [name] Primary Care Marquay Kruse: Dennis Bast Other Clinician: Referring Kelsay Haggard: Treating Fran Neiswonger/Extender: Charlott Rakes in Treatment: 2 Encounter Discharge Information Items Discharge Condition: Stable Ambulatory Status: Ambulatory Discharge Destination: Home Transportation: Private Auto Accompanied By: self Schedule Follow-up Appointment: Yes Clinical Summary of Care: Electronic Signature(s) Signed: 03/01/2021 5:27:53 PM By: Shawn Stall RN, BSN Entered By: Shawn Stall  on 03/01/2021 15:22:59 -------------------------------------------------------------------------------- Lower Extremity Assessment Details Patient Name: Date of Service: Joseph Ortega, Joseph Ortega 03/01/2021 2:45 PM Medical Record Number: [id number] Patient Account Number: [id number] Date of Birth/Sex: Treating RN: 30-Nov-1955 (65 y.o. [name] Primary Care Axle Parfait: Dennis Bast Other Clinician: Referring Devynn Hessler: Treating Tessia Kassin/Extender: Lorin Mercy Weeks in Treatment: 2 Edema Assessment Assessed: [Left: Yes] [Right: Yes] Edema: [Left: Yes] [Right: Yes] Calf Left: Right: Point of Measurement: 33 cm From Medial Instep 47.5 cm 45 cm Ankle Left: Right: Point of Measurement: 10 cm From Medial Instep 31 cm 30.5 cm Vascular Assessment Pulses:  Dorsalis Pedis Palpable: [Left:Yes] [Right:Yes] Electronic Signature(s) Signed: 03/01/2021 5:27:53 PM By: Shawn Stall RN, BSN Entered By: Shawn Stall on 03/01/2021 15:09:53 -------------------------------------------------------------------------------- Multi-Disciplinary Care Plan Details Patient Name: Date of Service: Joseph Ortega, Joseph Ortega 03/01/2021 2:45 PM Medical Record Number: [id number] Patient Account Number: [id number] Date of Birth/Sex: Treating RN: [birth year] (65 y.o. Harlon Flor, Yvonne Kendall Primary Care Seger Jani: Dennis Bast Other Clinician: Referring Ozie Lupe: Treating Fernandez Kenley/Extender: Charlott Rakes in Treatment: 2 Active Inactive Nutrition Nursing Diagnoses: Impaired glucose control: actual or potential Goals: Patient/caregiver verbalizes understanding of need to maintain therapeutic glucose control per primary care physician Date Initiated: 02/15/2021 Target Resolution Date: 03/15/2021 Goal Status: Active Interventions: Assess HgA1c results as ordered upon admission and as needed Provide education on elevated blood sugars and impact on wound healing Notes: Wound/Skin  Impairment Nursing Diagnoses: Impaired tissue integrity Goals: Patient/caregiver will verbalize understanding of skin care regimen Date Initiated: 02/15/2021 Target Resolution Date: 03/15/2021 Goal Status: Active Ulcer/skin breakdown will have a volume reduction of 30% by week 4 Date Initiated: 02/15/2021 Target Resolution Date: 03/15/2021 Goal Status: Active Interventions: Assess patient/caregiver ability to obtain necessary supplies Assess patient/caregiver ability to perform ulcer/skin care regimen upon admission and as needed Assess ulceration(s) every visit Provide education on ulcer and skin care Treatment Activities: Topical wound management initiated : 02/15/2021 Notes: Electronic Signature(s) Signed: 03/01/2021 5:27:53 PM By: Shawn Stall RN, BSN Entered By: Shawn Stall on 03/01/2021 15:11:58 -------------------------------------------------------------------------------- Pain Assessment Details Patient Name: Date of Service: Joseph Ortega, Joseph Ortega 03/01/2021 2:45 PM Medical Record Number: [id number] Patient Account Number: [id number] Date of Birth/Sex: Treating RN: [birth year] (65 y.o. [name] Primary Care Lorayne Getchell: Dennis Bast Other Clinician: Referring Keyanni Whittinghill: Treating Trew Sunde/Extender: Lorin Mercy Weeks in Treatment: 2 Active Problems Location of Pain Severity and Description of Pain Patient Has Paino No Site Locations Rate the pain. Current Pain Level: 0 Pain Management and Medication Current Pain Management: Medication: No Cold Application: No Rest: No Massage: No Activity: No T.E.N.S.: No Heat Application: No Leg drop or elevation: No Is the Current Pain Management Adequate: Adequate How does your wound impact your activities of daily livingo Sleep: No Bathing: No Appetite: No Relationship With Others: No Bladder Continence: No Emotions: No Bowel Continence: No Work: No Toileting: No Drive: No Dressing:  No Hobbies: No Psychologist, prison and probation services) Signed: 03/01/2021 5:27:53 PM By: Shawn Stall RN, BSN Entered By: Shawn Stall on 03/01/2021 15:09:27 -------------------------------------------------------------------------------- Patient/Caregiver Education Details Patient Name: Date of Service: Joseph Ortega 12/14/2022andnbsp2:45 PM Medical Record Number: [id number] Patient Account Number: [id number] Date of Birth/Gender: Treating RN: 12/19/1955 (65 y.o. [name] Primary Care Physician: Dennis Bast Other Clinician: Referring Physician: Treating Physician/Extender: Charlott Rakes in Treatment: 2 Education Assessment Education Provided To: Patient Education Topics Provided Elevated Blood Sugar/ Impact on Healing: Handouts: Elevated Blood Sugars: How Do They Affect Wound Healing Methods: Explain/Verbal Responses: Reinforcements needed Electronic Signature(s) Signed: 03/01/2021 5:27:53 PM By: Shawn Stall RN, BSN Entered By: Shawn Stall on 03/01/2021 15:12:09 -------------------------------------------------------------------------------- Wound Assessment Details Patient Name: Date of Service: Joseph Ortega, Joseph Ortega 03/01/2021 2:45 PM Medical Record Number: [id number] Patient Account Number: [id number] Date of Birth/Sex: Treating RN: [birth year] (65 y.o. [name] Primary Care Lennan Malone: Dennis Bast Other Clinician: Referring Nathyn Luiz: Treating Sonni Barse/Extender: Lorin Mercy Weeks in Treatment: 2 Wound Status Wound Number: 1 Primary Venous Leg Ulcer Etiology: Wound Location: Right, Lateral Lower Leg Wound Status: Open Wounding Event: Gradually Appeared Comorbid Sleep Apnea, Hypertension, Type II Diabetes, Osteoarthritis,  Date Acquired: 01/18/2021 History: Neuropathy Weeks Of Treatment: 2 Clustered Wound: No Wound Measurements Length: (cm) 1.1 Width: (cm) 1 Depth: (cm) 0.1 Area: (cm) 0.864 Volume: (cm) 0.086 %  Reduction in Area: 67.6% % Reduction in Volume: 67.8% Epithelialization: Small (1-33%) Tunneling: No Undermining: No Wound Description Classification: Full Thickness Without Exposed Support Structures Wound Margin: Flat and Intact Exudate Amount: Small Exudate Type: Serosanguineous Exudate Color: red, brown Foul Odor After Cleansing: No Slough/Fibrino No Wound Bed Granulation Amount: Large (67-100%) Exposed Structure Granulation Quality: Red Fascia Exposed: No Necrotic Amount: None Present (0%) Fat Layer (Subcutaneous Tissue) Exposed: Yes Tendon Exposed: No Muscle Exposed: No Joint Exposed: No Bone Exposed: No Treatment Notes Wound #1 (Lower Leg) Wound Laterality: Right, Lateral Cleanser Soap and Water Discharge Instruction: May shower and wash wound with dial antibacterial soap and water prior to dressing change. Wound Cleanser Discharge Instruction: Cleanse the wound with wound cleanser prior to applying a clean dressing using gauze sponges, not tissue or cotton balls. Peri-Wound Care Sween Lotion (Moisturizing lotion) Discharge Instruction: Apply moisturizing lotion as directed Topical Primary Dressing KerraCel Ag Gelling Fiber Dressing, 4x5 in (silver alginate) Discharge Instruction: Apply silver alginate to wound bed as instructed Secondary Dressing Woven Gauze Sponge, Non-Sterile 4x4 in Discharge Instruction: Apply over primary dressing as directed. Secured With Compression Wrap ThreePress (3 layer compression wrap) Discharge Instruction: Apply three layer compression as directed. Compression Stockings Add-Ons Electronic Signature(s) Signed: 03/01/2021 5:27:53 PM By: Shawn Stall RN, BSN Entered By: Shawn Stall on 03/01/2021 15:10:17 -------------------------------------------------------------------------------- Wound Assessment Details Patient Name: Date of Service: Joseph Ortega, Joseph Ortega 03/01/2021 2:45 PM Medical Record Number: [id number] Patient Account  Number: [id number] Date of Birth/Sex: Treating RN: 04-10-1955 (65 y.o. Harlon Flor, Millard.Loa Primary Care Geraldy Akridge: Dennis Bast Other Clinician: Referring Aaleigha Bozza: Treating Brad Mcgaughy/Extender: Lorin Mercy Weeks in Treatment: 2 Wound Status Wound Number: 2 Primary Trauma, Other Etiology: Wound Location: Left, Anterior Lower Leg Wound Status: Open Wounding Event: Trauma Comorbid Sleep Apnea, Hypertension, Type II Diabetes, Osteoarthritis, Date Acquired: 01/18/2021 History: Neuropathy Weeks Of Treatment: 2 Clustered Wound: No Wound Measurements Length: (cm) 0.8 Width: (cm) 0.5 Depth: (cm) 0.1 Area: (cm) 0.314 Volume: (cm) 0.031 % Reduction in Area: 95.2% % Reduction in Volume: 97.6% Epithelialization: Medium (34-66%) Tunneling: No Undermining: No Wound Description Classification: Full Thickness Without Exposed Support Structures Wound Margin: Distinct, outline attached Exudate Amount: Small Exudate Type: Serosanguineous Exudate Color: red, brown Foul Odor After Cleansing: No Slough/Fibrino Yes Wound Bed Granulation Amount: Medium (34-66%) Exposed Structure Granulation Quality: Red Fascia Exposed: No Necrotic Amount: Medium (34-66%) Fat Layer (Subcutaneous Tissue) Exposed: Yes Necrotic Quality: Adherent Slough Tendon Exposed: No Muscle Exposed: No Joint Exposed: No Bone Exposed: No Treatment Notes Wound #2 (Lower Leg) Wound Laterality: Left, Anterior Cleanser Soap and Water Discharge Instruction: May shower and wash wound with dial antibacterial soap and water prior to dressing change. Wound Cleanser Discharge Instruction: Cleanse the wound with wound cleanser prior to applying a clean dressing using gauze sponges, not tissue or cotton balls. Peri-Wound Care Sween Lotion (Moisturizing lotion) Discharge Instruction: Apply moisturizing lotion as directed Topical Primary Dressing KerraCel Ag Gelling Fiber Dressing, 4x5 in (silver  alginate) Discharge Instruction: Apply silver alginate to wound bed as instructed Secondary Dressing Woven Gauze Sponge, Non-Sterile 4x4 in Discharge Instruction: Apply over primary dressing as directed. Secured With Compression Wrap ThreePress (3 layer compression wrap) Discharge Instruction: Apply three layer compression as directed. Compression Stockings Add-Ons Electronic Signature(s) Signed: 03/01/2021 5:27:53 PM By: Shawn Stall RN, BSN Entered By:  Shawn Stall on 03/01/2021 15:11:17 -------------------------------------------------------------------------------- Vitals Details Patient Name: Date of Service: Joseph Ortega, Joseph Ortega 03/01/2021 2:45 PM Medical Record Number: [id number] Patient Account Number: [id number] Date of Birth/Sex: Treating RN: 10/28/1955 (65 y.o. [name] Primary Care Deondray Ospina: Dennis Bast Other Clinician: Referring Mickeal Daws: Treating Denali Becvar/Extender: Lorin Mercy Weeks in Treatment: 2 Vital Signs Time Taken: 15:03 Temperature (F): 98.4 Height (in): 72 Pulse (bpm): 105 Weight (lbs): 284 Respiratory Rate (breaths/min): 20 Body Mass Index (BMI): 38.5 Blood Pressure (mmHg): 162/8 Capillary Blood Glucose (mg/dl): 277 Reference Range: 80 - 120 mg / dl Electronic Signature(s) Signed: 03/01/2021 5:27:53 PM By: Shawn Stall RN, BSN Entered By: Shawn Stall on 03/01/2021 15:09:19

## 2021-03-01 NOTE — Progress Notes (Addendum)
Joseph Ortega, Joseph Ortega ([id number]) Visit Report for 03/01/2021 Chief Complaint Document Details Patient Name: Date of Service: Joseph Ortega 03/01/2021 2:45 PM Medical Record Number: [id number] Patient Account Number: [id number] Date of Birth/Sex: Treating RN: Aug 03, 1955 (65 y.o. Joseph Ortega Primary Care Provider: Dennis Bast Other Clinician: Referring Provider: Treating Provider/Extender: Lorin Mercy Weeks in Treatment: 2 Information Obtained from: Patient Chief Complaint Bilateral LE Ulcers Electronic Signature(s) Signed: 03/01/2021 2:29:54 PM By: Lenda Kelp PA-C Entered By: Lenda Kelp on 03/01/2021 14:29:54 -------------------------------------------------------------------------------- HPI Details Patient Name: Date of Service: Joseph Ortega, Joseph Ortega 03/01/2021 2:45 PM Medical Record Number: [id number] Patient Account Number: [id number] Date of Birth/Sex: Treating RN: [birth year] (65 y.o. Joseph Ortega Primary Care Provider: Dennis Bast Other Clinician: Referring Provider: Treating Provider/Extender: Lorin Mercy Weeks in Treatment: 2 History of Present Illness HPI Description: 02/15/2021 upon evaluation today patient presents for initial inspection here in our clinic concerning wounds over the bilateral lower extremities. He subsequently has previously gone to the [facility] wound care center when he has had previous issues and was very pleased with service there. Subsequently however he did have issues with travel times about an hour for him to drive there and back subsequently he was coming here this time to try to work things out a little bit more conveniently for him he also drives to [employer] every day for work. Subsequently the patient's hemoglobin A1c is 10.3. He in the past has done well with silver alginate and Unna boot dressing. He is currently on doxycycline for 10 days and has been using his own compression socks.  He does have a history of chronic venous insufficiency and diabetes mellitus type 2. 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 02/22/2021 upon inspection patient's wound bed actually showed signs of significant improvement. I am actually very pleased with where we stand today. I do not see any signs of infection which is great news. No fevers, chills, nausea, vomiting, or diarrhea. 03/01/2021 upon evaluation today I do have the patient's arterial study for review which showed that he had excellent blood flow that was perfect on both the right and the left side. He had this done on Friday. This was a TBI evaluation as he was noncompressible for ABIs. Overall I am extremely pleased with the reason to see things at this point. His wounds also are doing pretty well although his legs are very swollen I think that we definitely getting back into the wraps. I think a 3 layer compression wrap will be best at this point. Electronic Signature(s) Signed: 03/01/2021 3:30:02 PM By: Lenda Kelp PA-C Entered By: Lenda Kelp on 03/01/2021 15:30:01 -------------------------------------------------------------------------------- Physical Exam Details Patient Name: Date of Service: Joseph Ortega, Joseph Ortega 03/01/2021 2:45 PM Medical Record Number: [id number] Patient Account Number: [id number] Date of Birth/Sex: Treating RN: 03-17-1956 (65 y.o. Joseph Ortega Primary Care Provider: Dennis Bast Other Clinician: Referring Provider: Treating Provider/Extender: Lorin Mercy Weeks in Treatment: 2 Constitutional Well-nourished and well-hydrated in no acute distress. Respiratory normal breathing without difficulty. Psychiatric this patient is able to make decisions and  demonstrates good insight into disease process. Alert and Oriented x 3. pleasant and  cooperative. Notes Upon inspection patient's wounds did not require any sharp debridement and seem to be really healthy. With that being said I think that continuing the alginate is good although I do believe a 3 layer compression wrap would be better for him. Electronic Signature(s) Signed: 03/01/2021 3:30:21 PM By: Lenda Kelp PA-C Entered By: Lenda Kelp on 03/01/2021 15:30:21 -------------------------------------------------------------------------------- Physician Orders Details Patient Name: Date of Service: Joseph Ortega, Joseph Ortega 03/01/2021 2:45 PM Medical Record Number: [id number] Patient Account Number: [id number] Date of Birth/Sex: Treating RN: September 11, 1955 (65 y.o. Joseph Ortega Primary Care Provider: Dennis Bast Other Clinician: Referring Provider: Treating Provider/Extender: Charlott Rakes in Treatment: 2 Verbal / Phone Orders: No Diagnosis Coding ICD-10 Coding Code Description I87.2 Venous insufficiency (chronic) (peripheral) L97.812 Non-pressure chronic ulcer of other part of right lower leg with fat layer exposed L97.822 Non-pressure chronic ulcer of other part of left lower leg with fat layer exposed E11.622 Type 2 diabetes mellitus with other skin ulcer Follow-up Appointments ppointment in 1 week. - with Leonard Schwartz Return A Bring in compression stockings weekly. Bathing/ Shower/ Hygiene May shower with protection but do not get wound dressing(s) wet. Edema Control - Lymphedema / SCD / Other Bilateral Lower Extremities Elevate legs to the level of the heart or above for 30 minutes daily and/or when sitting, a frequency of: - 3-4 times a day throughout the day. Avoid standing for long periods of time. Exercise regularly Additional Orders / Instructions Follow Nutritious Diet - Monitor blood sugar-keep controlled to help with wound healing Wound Treatment Wound #1  - Lower Leg Wound Laterality: Right, Lateral Cleanser: Soap and Water 1 x Per Week/30 Days Discharge Instructions: May shower and wash wound with dial antibacterial soap and water prior to dressing change. Cleanser: Wound Cleanser 1 x Per Week/30 Days Discharge Instructions: Cleanse the wound with wound cleanser prior to applying a clean dressing using gauze sponges, not tissue or cotton balls. Peri-Wound Care: Sween Lotion (Moisturizing lotion) 1 x Per Week/30 Days Discharge Instructions: Apply moisturizing lotion as directed Prim Dressing: KerraCel Ag Gelling Fiber Dressing, 4x5 in (silver alginate) 1 x Per Week/30 Days ary Discharge Instructions: Apply silver alginate to wound bed as instructed Secondary Dressing: Woven Gauze Sponge, Non-Sterile 4x4 in 1 x Per Week/30 Days Discharge Instructions: Apply over primary dressing as directed. Compression Wrap: ThreePress (3 layer compression wrap) 1 x Per Week/30 Days Discharge Instructions: Apply three layer compression as directed. Wound #2 - Lower Leg Wound Laterality: Left, Anterior Cleanser: Soap and Water 1 x Per Week/30 Days Discharge Instructions: May shower and wash wound with dial antibacterial soap and water prior to dressing change. Cleanser: Wound Cleanser 1 x Per Week/30 Days Discharge Instructions: Cleanse the wound with wound cleanser prior to applying a clean dressing using gauze sponges, not tissue or cotton balls. Peri-Wound Care: Sween Lotion (Moisturizing lotion) 1 x Per Week/30 Days Discharge Instructions: Apply moisturizing lotion as directed Prim Dressing: KerraCel Ag Gelling Fiber Dressing, 4x5 in (silver alginate) 1 x Per Week/30 Days ary Discharge Instructions: Apply silver alginate to wound bed as instructed Secondary Dressing: Woven Gauze Sponge, Non-Sterile 4x4 in 1 x Per Week/30 Days Discharge Instructions: Apply over primary dressing as directed. Compression Wrap: ThreePress (3 layer compression wrap) 1 x Per  Week/30 Days Discharge Instructions: Apply three layer compression as directed. Electronic Signature(s) Signed: 03/01/2021 3:33:06 PM By: Lenda Kelp PA-C Signed: 03/01/2021 5:27:53 PM By: Shawn Stall RN, BSN Entered By: Shawn Stall on 03/01/2021 15:21:31 --------------------------------------------------------------------------------  Problem List Details Patient Name: Date of Service: SAYAN, ALDAVA 03/01/2021 2:45 PM Medical Record Number: [id number] Patient Account Number: [id number] Date of Birth/Sex: Treating RN: 03/27/1955 (65 y.o. Joseph Ortega Primary Care Provider: Dennis Bast Other Clinician: Referring Provider: Treating Provider/Extender: Lorin Mercy Weeks in Treatment: 2 Active Problems ICD-10 Encounter Code Description Active Date MDM Diagnosis I87.2 Venous insufficiency (chronic) (peripheral) 02/15/2021 No Yes L97.812 Non-pressure chronic ulcer of other part of right lower leg with fat layer 02/15/2021 No Yes exposed L97.822 Non-pressure chronic ulcer of other part of left lower leg with fat layer 02/15/2021 No Yes exposed E11.622 Type 2 diabetes mellitus with other skin ulcer 02/15/2021 No Yes Inactive Problems Resolved Problems Electronic Signature(s) Signed: 03/01/2021 2:29:41 PM By: Lenda Kelp PA-C Entered By: Lenda Kelp on 03/01/2021 14:29:40 -------------------------------------------------------------------------------- Progress Note Details Patient Name: Date of Service: Joseph Ortega, Joseph Ortega 03/01/2021 2:45 PM Medical Record Number: [id number] Patient Account Number: [id number] Date of Birth/Sex: Treating RN: 1955-08-07 (65 y.o. Joseph Ortega Primary Care Provider: Dennis Bast Other Clinician: Referring Provider: Treating Provider/Extender: Lorin Mercy Weeks in Treatment: 2 Subjective Chief Complaint Information obtained from Patient Bilateral LE Ulcers History of Present Illness  (HPI) 02/15/2021 upon evaluation today patient presents for initial inspection here in our clinic concerning wounds over the bilateral lower extremities. He subsequently has previously gone to the [facility] wound care center when he has had previous issues and was very pleased with service there. Subsequently however he did have issues with travel times about an hour for him to drive there and back subsequently he was coming here this time to try to work things out a little bit more conveniently for him he also drives to [employer] every day for work. Subsequently the patient's hemoglobin A1c is 10.3. He in the past has done well with silver alginate and Unna boot dressing. He is currently on doxycycline for 10 days and has been using his own compression socks. He does have a history of chronic venous insufficiency and diabetes mellitus type 2. 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 02/22/2021 upon inspection patient's wound bed actually showed signs of significant improvement. I am actually very pleased with where we stand today. I do not see any signs of infection which is great news. No fevers, chills, nausea, vomiting, or diarrhea. 03/01/2021 upon evaluation today I do have the patient's arterial study for review which showed that he had excellent blood flow that was perfect on both the right and the left side. He had this done on Friday. This was a TBI evaluation as he was noncompressible for ABIs. Overall I am extremely pleased with the reason to see things at this point. His wounds also are doing pretty well although his legs are very swollen I think that we definitely getting back into the wraps. I think a 3 layer compression wrap will be best at this  point. Objective Constitutional Well-nourished and well-hydrated in no acute distress. Vitals Time Taken: 3:03 PM, Height: 72 in, Weight: 284 lbs, BMI: 38.5, Temperature: 98.4 F, Pulse: 105 bpm, Respiratory Rate: 20 breaths/min, Blood Pressure: 162/8 mmHg, Capillary Blood Glucose: 178 mg/dl. Respiratory normal breathing without  difficulty. Psychiatric this patient is able to make decisions and demonstrates good insight into disease process. Alert and Oriented x 3. pleasant and cooperative. General Notes: Upon inspection patient's wounds did not require any sharp debridement and seem to be really healthy. With that being said I think that continuing the alginate is good although I do believe a 3 layer compression wrap would be better for him. Integumentary (Hair, Skin) Wound #1 status is Open. Original cause of wound was Gradually Appeared. The date acquired was: 01/18/2021. The wound has been in treatment 2 weeks. The wound is located on the Right,Lateral Lower Leg. The wound measures 1.1cm length x 1cm width x 0.1cm depth; 0.864cm^2 area and 0.086cm^3 volume. There is Fat Layer (Subcutaneous Tissue) exposed. There is no tunneling or undermining noted. There is a small amount of serosanguineous drainage noted. The wound margin is flat and intact. There is large (67-100%) red granulation within the wound bed. There is no necrotic tissue within the wound bed. Wound #2 status is Open. Original cause of wound was Trauma. The date acquired was: 01/18/2021. The wound has been in treatment 2 weeks. The wound is located on the Left,Anterior Lower Leg. The wound measures 0.8cm length x 0.5cm width x 0.1cm depth; 0.314cm^2 area and 0.031cm^3 volume. There is Fat Layer (Subcutaneous Tissue) exposed. There is no tunneling or undermining noted. There is a small amount of serosanguineous drainage noted. The wound margin is distinct with the outline attached to the wound base. There is medium (34-66%) red  granulation within the wound bed. There is a medium (34-66%) amount of necrotic tissue within the wound bed including Adherent Slough. Assessment Active Problems ICD-10 Venous insufficiency (chronic) (peripheral) Non-pressure chronic ulcer of other part of right lower leg with fat layer exposed Non-pressure chronic ulcer of other part of left lower leg with fat layer exposed Type 2 diabetes mellitus with other skin ulcer Procedures Wound #1 Pre-procedure diagnosis of Wound #1 is a Venous Leg Ulcer located on the Right,Lateral Lower Leg . There was a Three Layer Compression Therapy Procedure by Shawn Stall, RN. Post procedure Diagnosis Wound #1: Same as Pre-Procedure Wound #2 Pre-procedure diagnosis of Wound #2 is a Trauma, Other located on the Left,Anterior Lower Leg . There was a Three Layer Compression Therapy Procedure by Shawn Stall, RN. Post procedure Diagnosis Wound #2: Same as Pre-Procedure Plan Follow-up Appointments: Return Appointment in 1 week. - with Leonard Schwartz Bring in compression stockings weekly. Bathing/ Shower/ Hygiene: May shower with protection but do not get wound dressing(s) wet. Edema Control - Lymphedema / SCD / Other: Elevate legs to the level of the heart or above for 30 minutes daily and/or when sitting, a frequency of: - 3-4 times a day throughout the day. Avoid standing for long periods of time. Exercise regularly Additional Orders / Instructions: Follow Nutritious Diet - Monitor blood sugar-keep controlled to help with wound healing WOUND #1: - Lower Leg Wound Laterality: Right, Lateral Cleanser: Soap and Water 1 x Per Week/30 Days Discharge Instructions: May shower and wash wound with dial antibacterial soap and water prior to dressing change. Cleanser: Wound Cleanser 1 x Per Week/30 Days Discharge Instructions: Cleanse the wound with wound cleanser prior to applying a clean dressing using gauze sponges, not tissue or cotton balls. Peri-Wound Care: Sween  Lotion (Moisturizing lotion) 1 x Per Week/30 Days Discharge Instructions: Apply moisturizing lotion as directed Prim Dressing: KerraCel Ag Gelling Fiber Dressing, 4x5 in (silver alginate) 1 x Per Week/30 Days ary Discharge Instructions: Apply  silver alginate to wound bed as instructed Secondary Dressing: Woven Gauze Sponge, Non-Sterile 4x4 in 1 x Per Week/30 Days Discharge Instructions: Apply over primary dressing as directed. Com pression Wrap: ThreePress (3 layer compression wrap) 1 x Per Week/30 Days Discharge Instructions: Apply three layer compression as directed. WOUND #2: - Lower Leg Wound Laterality: Left, Anterior Cleanser: Soap and Water 1 x Per Week/30 Days Discharge Instructions: May shower and wash wound with dial antibacterial soap and water prior to dressing change. Cleanser: Wound Cleanser 1 x Per Week/30 Days Discharge Instructions: Cleanse the wound with wound cleanser prior to applying a clean dressing using gauze sponges, not tissue or cotton balls. Peri-Wound Care: Sween Lotion (Moisturizing lotion) 1 x Per Week/30 Days Discharge Instructions: Apply moisturizing lotion as directed Prim Dressing: KerraCel Ag Gelling Fiber Dressing, 4x5 in (silver alginate) 1 x Per Week/30 Days ary Discharge Instructions: Apply silver alginate to wound bed as instructed Secondary Dressing: Woven Gauze Sponge, Non-Sterile 4x4 in 1 x Per Week/30 Days Discharge Instructions: Apply over primary dressing as directed. Com pression Wrap: ThreePress (3 layer compression wrap) 1 x Per Week/30 Days Discharge Instructions: Apply three layer compression as directed. 1. Would recommend currently that we going to continue with the wound care measures as before using the silver alginate we will also utilize a 3 layer compression wrap which I think is going to be best at this time. 2. I am also can recommend that we have the patient continue with the elevation of his legs much as possible to help with  edema control. The more he can elevate the better off he will be in my opinion. 3. He also does have compression socks he should start bring in those with him as soon as we get them healed he will need to be wearing his compression socks. We will see patient back for reevaluation in 1 week here in the clinic. If anything worsens or changes patient will contact our office for additional recommendations. Electronic Signature(s) Signed: 03/01/2021 3:30:58 PM By: Lenda Kelp PA-C Entered By: Lenda Kelp on 03/01/2021 15:30:58 -------------------------------------------------------------------------------- SuperBill Details Patient Name: Date of Service: Joseph Ortega, Joseph Ortega 03/01/2021 Medical Record Number: [id number] Patient Account Number: [id number] Date of Birth/Sex: Treating RN: August 26, 1955 (65 y.o. Harlon Flor, Yvonne Kendall Primary Care Provider: Dennis Bast Other Clinician: Referring Provider: Treating Provider/Extender: Lorin Mercy Weeks in Treatment: 2 Diagnosis Coding ICD-10 Codes Code Description I87.2 Venous insufficiency (chronic) (peripheral) L97.812 Non-pressure chronic ulcer of other part of right lower leg with fat layer exposed L97.822 Non-pressure chronic ulcer of other part of left lower leg with fat layer exposed E11.622 Type 2 diabetes mellitus with other skin ulcer Facility Procedures CPT4: Code [id number] foot Description: 1 BILATERAL: Application of multi-layer venous compression system; leg (below knee), including ankle and . Modifier: Quantity: 1 Physician Procedures : CPT4 Code Description Modifier [id number] - WC PHYS LEVEL 4 - EST PT ICD-10 Diagnosis Description I87.2 Venous insufficiency (chronic) (peripheral) L97.812 Non-pressure chronic ulcer of other part of right lower leg with fat layer exposed L97.822  Non-pressure chronic ulcer of other part of left lower leg with fat layer exposed E11.622 Type 2 diabetes mellitus with other skin  ulcer Quantity: 1 Electronic Signature(s) Signed: 03/01/2021 3:31:12 PM By: Lenda Kelp PA-C Entered By: Lenda Kelp on 03/01/2021 15:31:12

## 2021-03-08 ENCOUNTER — Encounter (HOSPITAL_BASED_OUTPATIENT_CLINIC_OR_DEPARTMENT_OTHER): Payer: BC Managed Care – PPO | Admitting: Physician Assistant

## 2021-03-08 ENCOUNTER — Inpatient Hospital Stay (HOSPITAL_COMMUNITY): Payer: BC Managed Care – PPO

## 2021-03-08 ENCOUNTER — Emergency Department
Admission: EM | Admit: 2021-03-08 | Discharge: 2021-03-08 | Disposition: A | Discharge status: Other Acute Inpatient Hospital | Payer: BC Managed Care – PPO | Attending: Emergency Medicine | Admitting: Emergency Medicine

## 2021-03-08 ENCOUNTER — Other Ambulatory Visit: Payer: Self-pay

## 2021-03-08 ENCOUNTER — Emergency Department: Payer: BC Managed Care – PPO

## 2021-03-08 ENCOUNTER — Inpatient Hospital Stay (HOSPITAL_COMMUNITY)
Admission: AD | Admit: 2021-03-08 | Discharge: 2021-03-17 | DRG: 064 | Disposition: A | Discharge status: Rehabilitation Facility | Payer: BC Managed Care – PPO | Source: Other Acute Inpatient Hospital | Attending: Internal Medicine | Admitting: Internal Medicine

## 2021-03-08 DIAGNOSIS — I63311 Cerebral infarction due to thrombosis of right middle cerebral artery: Secondary | ICD-10-CM | POA: Diagnosis not present

## 2021-03-08 DIAGNOSIS — F32A Depression, unspecified: Secondary | ICD-10-CM | POA: Diagnosis present

## 2021-03-08 DIAGNOSIS — I609 Nontraumatic subarachnoid hemorrhage, unspecified: Secondary | ICD-10-CM

## 2021-03-08 DIAGNOSIS — H5789 Other specified disorders of eye and adnexa: Secondary | ICD-10-CM | POA: Diagnosis not present

## 2021-03-08 DIAGNOSIS — G936 Cerebral edema: Secondary | ICD-10-CM | POA: Diagnosis present

## 2021-03-08 DIAGNOSIS — H53453 Other localized visual field defect, bilateral: Secondary | ICD-10-CM

## 2021-03-08 DIAGNOSIS — I63312 Cerebral infarction due to thrombosis of left middle cerebral artery: Secondary | ICD-10-CM | POA: Diagnosis not present

## 2021-03-08 DIAGNOSIS — Q283 Other malformations of cerebral vessels: Secondary | ICD-10-CM | POA: Diagnosis not present

## 2021-03-08 DIAGNOSIS — K3184 Gastroparesis: Secondary | ICD-10-CM | POA: Diagnosis not present

## 2021-03-08 DIAGNOSIS — I610 Nontraumatic intracerebral hemorrhage in hemisphere, subcortical: Secondary | ICD-10-CM | POA: Diagnosis not present

## 2021-03-08 DIAGNOSIS — E119 Type 2 diabetes mellitus without complications: Secondary | ICD-10-CM | POA: Diagnosis not present

## 2021-03-08 DIAGNOSIS — Z794 Long term (current) use of insulin: Secondary | ICD-10-CM

## 2021-03-08 DIAGNOSIS — E039 Hypothyroidism, unspecified: Secondary | ICD-10-CM | POA: Diagnosis present

## 2021-03-08 DIAGNOSIS — I611 Nontraumatic intracerebral hemorrhage in hemisphere, cortical: Secondary | ICD-10-CM | POA: Diagnosis not present

## 2021-03-08 DIAGNOSIS — I639 Cerebral infarction, unspecified: Secondary | ICD-10-CM | POA: Insufficient documentation

## 2021-03-08 DIAGNOSIS — G8194 Hemiplegia, unspecified affecting left nondominant side: Secondary | ICD-10-CM | POA: Diagnosis present

## 2021-03-08 DIAGNOSIS — Z6837 Body mass index (BMI) 37.0-37.9, adult: Secondary | ICD-10-CM

## 2021-03-08 DIAGNOSIS — E1143 Type 2 diabetes mellitus with diabetic autonomic (poly)neuropathy: Secondary | ICD-10-CM | POA: Diagnosis not present

## 2021-03-08 DIAGNOSIS — I10 Essential (primary) hypertension: Secondary | ICD-10-CM | POA: Diagnosis not present

## 2021-03-08 DIAGNOSIS — I672 Cerebral atherosclerosis: Secondary | ICD-10-CM | POA: Diagnosis present

## 2021-03-08 DIAGNOSIS — E1142 Type 2 diabetes mellitus with diabetic polyneuropathy: Secondary | ICD-10-CM | POA: Diagnosis present

## 2021-03-08 DIAGNOSIS — Z20822 Contact with and (suspected) exposure to covid-19: Secondary | ICD-10-CM | POA: Insufficient documentation

## 2021-03-08 DIAGNOSIS — B029 Zoster without complications: Secondary | ICD-10-CM | POA: Diagnosis not present

## 2021-03-08 DIAGNOSIS — E1165 Type 2 diabetes mellitus with hyperglycemia: Secondary | ICD-10-CM | POA: Diagnosis present

## 2021-03-08 DIAGNOSIS — E1169 Type 2 diabetes mellitus with other specified complication: Secondary | ICD-10-CM | POA: Diagnosis not present

## 2021-03-08 DIAGNOSIS — R21 Rash and other nonspecific skin eruption: Secondary | ICD-10-CM | POA: Diagnosis not present

## 2021-03-08 DIAGNOSIS — E1159 Type 2 diabetes mellitus with other circulatory complications: Secondary | ICD-10-CM | POA: Diagnosis not present

## 2021-03-08 DIAGNOSIS — R471 Dysarthria and anarthria: Secondary | ICD-10-CM

## 2021-03-08 DIAGNOSIS — G4733 Obstructive sleep apnea (adult) (pediatric): Secondary | ICD-10-CM | POA: Diagnosis not present

## 2021-03-08 DIAGNOSIS — Z79899 Other long term (current) drug therapy: Secondary | ICD-10-CM

## 2021-03-08 DIAGNOSIS — F329 Major depressive disorder, single episode, unspecified: Secondary | ICD-10-CM | POA: Diagnosis present

## 2021-03-08 DIAGNOSIS — I6389 Other cerebral infarction: Secondary | ICD-10-CM | POA: Diagnosis not present

## 2021-03-08 DIAGNOSIS — R600 Localized edema: Secondary | ICD-10-CM | POA: Diagnosis not present

## 2021-03-08 DIAGNOSIS — R531 Weakness: Secondary | ICD-10-CM | POA: Diagnosis not present

## 2021-03-08 DIAGNOSIS — Z79890 Hormone replacement therapy (postmenopausal): Secondary | ICD-10-CM | POA: Diagnosis not present

## 2021-03-08 DIAGNOSIS — I63431 Cerebral infarction due to embolism of right posterior cerebral artery: Secondary | ICD-10-CM | POA: Diagnosis not present

## 2021-03-08 DIAGNOSIS — B028 Zoster with other complications: Secondary | ICD-10-CM | POA: Diagnosis not present

## 2021-03-08 DIAGNOSIS — E559 Vitamin D deficiency, unspecified: Secondary | ICD-10-CM | POA: Diagnosis present

## 2021-03-08 DIAGNOSIS — I152 Hypertension secondary to endocrine disorders: Secondary | ICD-10-CM

## 2021-03-08 DIAGNOSIS — Z720 Tobacco use: Secondary | ICD-10-CM | POA: Diagnosis present

## 2021-03-08 DIAGNOSIS — I63531 Cerebral infarction due to unspecified occlusion or stenosis of right posterior cerebral artery: Principal | ICD-10-CM | POA: Diagnosis present

## 2021-03-08 DIAGNOSIS — I872 Venous insufficiency (chronic) (peripheral): Secondary | ICD-10-CM | POA: Diagnosis not present

## 2021-03-08 DIAGNOSIS — E669 Obesity, unspecified: Secondary | ICD-10-CM | POA: Diagnosis not present

## 2021-03-08 LAB — CBC
HCT: 41 % (ref 39.0–52.0)
Hemoglobin: 14.3 g/dL (ref 13.0–17.0)
MCH: 32.6 pg (ref 26.0–34.0)
MCHC: 34.9 g/dL (ref 30.0–36.0)
MCV: 93.4 fL (ref 80.0–100.0)
Platelets: 221 10*3/uL (ref 150–400)
RBC: 4.39 MIL/uL (ref 4.22–5.81)
RDW: 13.4 % (ref 11.5–15.5)
WBC: 7.3 10*3/uL (ref 4.0–10.5)
nRBC: 0 % (ref 0.0–0.2)

## 2021-03-08 LAB — URINALYSIS, ROUTINE W REFLEX MICROSCOPIC
Bilirubin Urine: NEGATIVE
Glucose, UA: 100 mg/dL — AB
Ketones, ur: NEGATIVE mg/dL
Leukocytes,Ua: NEGATIVE
Nitrite: NEGATIVE
Protein, ur: >300 mg/dL — AB
Specific Gravity, Urine: 1.02 (ref 1.005–1.030)
Squamous Epithelial / HPF: NONE SEEN (ref 0–5)
pH: 5.5 (ref 5.0–8.0)

## 2021-03-08 LAB — COMPREHENSIVE METABOLIC PANEL
ALT: 12 U/L (ref 0–44)
AST: 22 U/L (ref 15–41)
Albumin: 3.5 g/dL (ref 3.5–5.0)
Alkaline Phosphatase: 129 U/L — ABNORMAL HIGH (ref 38–126)
Anion gap: 6 (ref 5–15)
BUN: 19 mg/dL (ref 8–23)
CO2: 29 mmol/L (ref 22–32)
Calcium: 9.1 mg/dL (ref 8.9–10.3)
Chloride: 103 mmol/L (ref 98–111)
Creatinine, Ser: 0.98 mg/dL (ref 0.61–1.24)
GFR, Estimated: >60 mL/min (ref >60)
Glucose, Bld: 76 mg/dL (ref 70–99)
Potassium: 3.4 mmol/L — ABNORMAL LOW (ref 3.5–5.1)
Sodium: 138 mmol/L (ref 135–145)
Total Bilirubin: 0.9 mg/dL (ref 0.3–1.2)
Total Protein: 7.2 g/dL (ref 6.5–8.1)

## 2021-03-08 LAB — PROTIME-INR
INR: 1.1 (ref 0.8–1.2)
Prothrombin Time: 13.7 seconds (ref 11.4–15.2)

## 2021-03-08 LAB — URINE DRUG SCREEN, QUALITATIVE (ARMC ONLY)
Amphetamines, Ur Screen: NOT DETECTED
Barbiturates, Ur Screen: NOT DETECTED
Benzodiazepine, Ur Scrn: NOT DETECTED
Cannabinoid 50 Ng, Ur ~~LOC~~: NOT DETECTED
Cocaine Metabolite,Ur ~~LOC~~: NOT DETECTED
MDMA (Ecstasy)Ur Screen: NOT DETECTED
Methadone Scn, Ur: NOT DETECTED
Opiate, Ur Screen: NOT DETECTED
Phencyclidine (PCP) Ur S: NOT DETECTED
Tricyclic, Ur Screen: NOT DETECTED

## 2021-03-08 LAB — RESP PANEL BY RT-PCR (FLU A&B, COVID) ARPGX2
Influenza A by PCR: NEGATIVE
Influenza B by PCR: NEGATIVE
SARS Coronavirus 2 by RT PCR: NEGATIVE

## 2021-03-08 LAB — APTT: aPTT: 30 seconds (ref 24–36)

## 2021-03-08 LAB — TSH: TSH: 9.736 u[IU]/mL — ABNORMAL HIGH (ref 0.350–4.500)

## 2021-03-08 LAB — MRSA NEXT GEN BY PCR, NASAL: MRSA by PCR Next Gen: DETECTED — AB

## 2021-03-08 LAB — DIFFERENTIAL
Abs Immature Granulocytes: 0.05 10*3/uL (ref 0.00–0.07)
Basophils Absolute: 0 10*3/uL (ref 0.0–0.1)
Basophils Relative: 1 %
Eosinophils Absolute: 0.2 10*3/uL (ref 0.0–0.5)
Eosinophils Relative: 3 %
Immature Granulocytes: 1 %
Lymphocytes Relative: 22 %
Lymphs Abs: 1.6 10*3/uL (ref 0.7–4.0)
Monocytes Absolute: 0.8 10*3/uL (ref 0.1–1.0)
Monocytes Relative: 10 %
Neutro Abs: 4.7 10*3/uL (ref 1.7–7.7)
Neutrophils Relative %: 63 %

## 2021-03-08 LAB — HIV ANTIBODY (ROUTINE TESTING W REFLEX): HIV Screen 4th Generation wRfx: NONREACTIVE

## 2021-03-08 LAB — MAGNESIUM: Magnesium: 2.2 mg/dL (ref 1.7–2.4)

## 2021-03-08 LAB — GLUCOSE, CAPILLARY
Glucose-Capillary: 190 mg/dL — ABNORMAL HIGH (ref 70–99)
Glucose-Capillary: 234 mg/dL — ABNORMAL HIGH (ref 70–99)

## 2021-03-08 MED ORDER — GADOBUTROL 1 MMOL/ML IV SOLN
10.0000 mL | Freq: Once | INTRAVENOUS | Status: AC | PRN
  Administered 2021-03-08: 15:00:00 10 mL via INTRAVENOUS

## 2021-03-08 MED ORDER — DOCUSATE SODIUM 100 MG PO CAPS: 100.0000 mg | ORAL_CAPSULE | Freq: Two times a day (BID) | ORAL | Status: DC | PRN

## 2021-03-08 MED ORDER — INSULIN ASPART 100 UNIT/ML IJ SOLN
0.0000 [IU] | INTRAMUSCULAR | Status: DC
  Administered 2021-03-08 – 2021-03-09 (×3): 3 [IU] via SUBCUTANEOUS
  Administered 2021-03-09 (×3): 5 [IU] via SUBCUTANEOUS

## 2021-03-08 MED ORDER — SODIUM CHLORIDE 0.9 % IV SOLN: INTRAVENOUS | Status: DC | PRN

## 2021-03-08 MED ORDER — LEVETIRACETAM IN NACL 500 MG/100ML IV SOLN
500.0000 mg | Freq: Once | INTRAVENOUS | Status: AC
  Administered 2021-03-08: 11:00:00 500 mg via INTRAVENOUS
  Filled 2021-03-08: qty 100

## 2021-03-08 MED ORDER — LEVETIRACETAM IN NACL 500 MG/100ML IV SOLN
500.0000 mg | Freq: Two times a day (BID) | INTRAVENOUS | Status: DC
  Administered 2021-03-08 – 2021-03-10 (×4): 500 mg via INTRAVENOUS
  Filled 2021-03-08 (×4): qty 100

## 2021-03-08 MED ORDER — ACETAMINOPHEN 325 MG PO TABS
650.0000 mg | ORAL_TABLET | Freq: Four times a day (QID) | ORAL | Status: DC | PRN
  Administered 2021-03-09 – 2021-03-16 (×5): 650 mg via ORAL
  Filled 2021-03-08 (×5): qty 2

## 2021-03-08 MED ORDER — VALACYCLOVIR HCL 500 MG PO TABS
1000.0000 mg | ORAL_TABLET | Freq: Three times a day (TID) | ORAL | Status: DC
  Filled 2021-03-08 (×2): qty 2

## 2021-03-08 MED ORDER — CLEVIDIPINE BUTYRATE 0.5 MG/ML IV EMUL
0.0000 mg/h | INTRAVENOUS | Status: DC
  Administered 2021-03-08: 10:00:00 1 mg/h via INTRAVENOUS
  Filled 2021-03-08: qty 50

## 2021-03-08 MED ORDER — IOHEXOL 350 MG/ML SOLN
75.0000 mL | Freq: Once | INTRAVENOUS | Status: AC | PRN
  Administered 2021-03-08: 09:00:00 75 mL via INTRAVENOUS

## 2021-03-08 MED ORDER — GABAPENTIN 300 MG PO CAPS: 300.0000 mg | ORAL_CAPSULE | Freq: Two times a day (BID) | ORAL | Status: DC

## 2021-03-08 MED ORDER — POLYETHYLENE GLYCOL 3350 17 G PO PACK
17.0000 g | PACK | Freq: Every day | ORAL | Status: DC | PRN
  Administered 2021-03-12: 11:00:00 17 g via ORAL
  Filled 2021-03-08: qty 1

## 2021-03-08 MED ORDER — LIDOCAINE 5 % EX PTCH
1.0000 | MEDICATED_PATCH | CUTANEOUS | Status: DC
  Administered 2021-03-08 – 2021-03-15 (×7): 1 via TRANSDERMAL
  Filled 2021-03-08 (×8): qty 1

## 2021-03-08 MED ORDER — CLEVIDIPINE BUTYRATE 0.5 MG/ML IV EMUL
0.0000 mg/h | INTRAVENOUS | Status: DC
  Administered 2021-03-08: 15:00:00 12 mg/h via INTRAVENOUS
  Administered 2021-03-08 (×2): 8 mg/h via INTRAVENOUS
  Administered 2021-03-08: 14:00:00 12 mg/h via INTRAVENOUS
  Administered 2021-03-09 (×2): 6 mg/h via INTRAVENOUS
  Administered 2021-03-09: 02:00:00 7 mg/h via INTRAVENOUS
  Administered 2021-03-09: 04:00:00 6 mg/h via INTRAVENOUS
  Filled 2021-03-08 (×8): qty 50

## 2021-03-08 NOTE — ED Notes (Signed)
Pt concerned about being in hospital because his wife is disabled.  He reports he does not have anyone that can bring her up here.  Pt continually sts that he "can't stay up here."  Pt educated on the severity of the situation.

## 2021-03-08 NOTE — ED Provider Notes (Signed)
[facility] Emergency Department Provider Note  ____________________________________________   Event Date/Time   First MD Initiated Contact with Patient 03/08/21 0840     (approximate)  I have reviewed the triage vital signs and the nursing notes.   HISTORY  Chief Complaint Weakness (Extremity) and Possible shingles    HPI [name] is a 65 y.o. male with diabetes, hypertension who comes in with weakness.  Patient states that he woke up at 2 AM and was at work.  He states that he felt his normal self and then at 7:30 AM he had sudden onset of left-sided weakness, constant, gradually improving, nothing made it better or worse.  States that seems much better at this point but still having weakness mostly in the left arm.  Denies any changes in speech or any other concerns.  He does report a rash underneath his left breast that is been there for 2 to 3 days.  Denies any history of shingles          Past Medical History:  Diagnosis Date   Diabetes mellitus without complication ([place])    Hypertension    Thyroid disease     There are no problems to display for this patient.   Past Surgical History:  Procedure Laterality Date   HERNIA REPAIR      Prior to Admission medications   Medication Sig Start Date End Date Taking? Authorizing Provider  doxazosin (CARDURA) 8 MG tablet Take 8 mg by mouth daily. 09/20/19   [provider]  DULoxetine (CYMBALTA) 20 MG capsule Take 20 mg by mouth 2 (two) times daily. 10/06/19   [provider]  gabapentin (NEURONTIN) 300 MG capsule Take 300 mg by mouth 2 (two) times daily. 04/28/19   [provider]  insulin detemir (LEVEMIR) 100 UNIT/ML injection Inject 50 Units into the skin daily.     [provider]  levothyroxine (SYNTHROID, LEVOTHROID) 125 MCG tablet Take 125 mcg by mouth daily before breakfast.    [provider]  metoprolol tartrate (LOPRESSOR) 50 MG tablet Take 50  mg by mouth daily. 09/14/19   [provider]  mirtazapine (REMERON) 15 MG tablet Take 7.5 mg by mouth at bedtime. 09/08/19   [provider]  NOVOLOG FLEXPEN 100 UNIT/ML FlexPen Inject 10 Units into the skin in the morning, at noon, and at bedtime. Sliding scale 10/10/19   [provider]  TRULICITY 3 0000000 SOPN Inject 3 mg into the skin every Thursday. 07/22/19   [provider]  Vitamin D, Ergocalciferol, (DRISDOL) 50000 UNITS CAPS capsule Take 50,000 Units by mouth every Sunday. sunday     [provider]    Allergies Patient has no known allergies.  No family history on file.  Social History Social History   Tobacco Use   Smoking status: Never   Smokeless tobacco: Never  Substance Use Topics   Alcohol use: No   Drug use: No      Review of Systems Constitutional: No fever/chills Eyes: No visual changes. ENT: No sore throat. Cardiovascular: Denies chest pain. Respiratory: Denies shortness of breath. Gastrointestinal: No abdominal pain.  No nausea, no vomiting.  No diarrhea.  No constipation. Genitourinary: Negative for dysuria. Musculoskeletal: Negative for back pain. Skin: Positive rash Neurological: Negative for headaches, focal weakness or numbness.  Positive weakness in the left side All other ROS negative ____________________________________________   PHYSICAL EXAM:  VITAL SIGNS: ED Triage Vitals [03/08/21 0847]  Enc Vitals Group  BP      Pulse      Resp      Temp      Temp src      SpO2 97 %     Weight      Height      Head Circumference      Peak Flow      Pain Score      Pain Loc      Pain Edu?      Excl. in [place]?     Constitutional: Alert and oriented. Well appearing and in no acute distress. Eyes: Conjunctivae are normal. EOMI. Head: Atraumatic. Nose: No congestion/rhinnorhea. Mouth/Throat: Mucous membranes are moist.   Neck: No stridor. Trachea Midline. FROM Cardiovascular: Normal rate,  regular rhythm. Grossly normal heart sounds.  Good peripheral circulation.  Rash noted underneath the left breast does not cross midline.  Vesicular in nature Respiratory: Normal respiratory effort.  No retractions. Lungs CTAB. Gastrointestinal: Soft and nontender. No distention. No abdominal bruits.  Musculoskeletal: No lower extremity tenderness nor edema.  No joint effusions. Neurologic:  Normal speech and language. No gross focal neurologic deficits are appreciated.  Weakness in the left side, and an NIH stroke scale of 2 for some drift in the left arm and left leg. Skin:  Skin is warm, dry and intact.  Positive rash as above Psychiatric: Mood and affect are normal. Speech and behavior are normal. GU: Deferred   ____________________________________________   LABS (all labs ordered are listed, but only abnormal results are displayed)  Labs Reviewed  RESP PANEL BY RT-PCR (FLU A&B, COVID) ARPGX2  BASIC METABOLIC PANEL  ETHANOL  PROTIME-INR  APTT  CBC  DIFFERENTIAL  COMPREHENSIVE METABOLIC PANEL  URINE DRUG SCREEN, QUALITATIVE ([facility] ONLY)  URINALYSIS, ROUTINE W REFLEX MICROSCOPIC   ____________________________________________   ED ECG REPORT I, Vanessa [place], the attending physician, personally viewed and interpreted this ECG.  Sinus rate of 73 without any ST elevation, T wave inversion in aVL and V2 with a right bundle branch block.  Reviewed prior EKGs and he has had similar rebound branch block before ____________________________________________  RADIOLOGY Robert Bellow, personally viewed and evaluated these images (plain radiographs) as part of my medical decision making, as well as reviewing the written report by the radiologist.  ED MD interpretation:  SAH/intraparenchymal  hemorrhage   Official radiology report(s): CT HEAD CODE STROKE WO CONTRAST  Result Date: 03/08/2021 CLINICAL DATA:  Code stroke. Neurological deficit. Acute stroke suspected. Left-sided weakness.  EXAM: CT HEAD WITHOUT CONTRAST TECHNIQUE: Contiguous axial images were obtained from the base of the skull through the vertex without intravenous contrast. COMPARISON:  MRI 06/17/2017.  CT studies 06/16/2017. FINDINGS: Brain: The brainstem and cerebellum are normal. Mild chronic small-vessel ischemic changes are seen within the cerebral hemispheric white matter. There is acute subarachnoid hemorrhage within the sulci of the right sylvian fissure and posterior frontal region. There appears to be a small intraparenchymal hemorrhage measuring 4 mm in size in the right posterior frontal region that may be the source of this hemorrhage. CT angiography in 2019 did not show an aneurysm. No hydrocephalus. No extra-axial collection. Vascular: There is atherosclerotic calcification of the major vessels at the base of the brain. Skull: Negative Sinuses/Orbits: Clear/normal Other: None ASPECTS ([place] Stroke Program Early CT Score) - Ganglionic level infarction (caudate, lentiform nuclei, internal capsule, insula, M1-M3 cortex): 7 - Supraganglionic infarction (M4-M6 cortex): 3 Total score (0-10 with 10 being normal): 10 IMPRESSION: 1. 4 mm  intraparenchymal hematoma in the right posterior frontal lobe, apparently with subarachnoid penetration. Subarachnoid blood within the sulci of the right posterior frontal region and right sylvian fissure. 2. This pattern of hemorrhage would ordinarily raise concern about a berry aneurysm, but no aneurysm was seen on CT angiography 06/16/2017. Additionally, the suspected presence of a intraparenchymal hemorrhage with subarachnoid extension could explain this pattern. 3. ASPECTS is 10 4. These results were communicated to Dr. Selina Cooley At 9:19 am on 03/08/2021 by text page via the [facility] messaging system. Electronically Signed   By: Paulina Fusi M.D.   On: 03/08/2021 09:21    ____________________________________________   PROCEDURES  Procedure(s) performed (including Critical Care):  .1-3  Lead EKG Interpretation Performed by: Concha Se, MD Authorized by: Concha Se, MD     Interpretation: normal     ECG rate:  70s   ECG rate assessment: normal     Rhythm: sinus rhythm     Ectopy: none     Conduction: normal   .Critical Care Performed by: Concha Se, MD Authorized by: Concha Se, MD   Critical care provider statement:    Critical care time (minutes):  35   Critical care was necessary to treat or prevent imminent or life-threatening deterioration of the following conditions:  CNS failure or compromise   Critical care was time spent personally by me on the following activities:  Development of treatment plan with patient or surrogate, discussions with consultants, evaluation of patient's response to treatment, examination of patient, ordering and review of laboratory studies, ordering and review of radiographic studies, ordering and performing treatments and interventions, pulse oximetry, re-evaluation of patient's condition and review of old charts   ____________________________________________   INITIAL IMPRESSION / ASSESSMENT AND PLAN / ED COURSE  Joseph Ortega was evaluated in Emergency Department on 03/08/2021 for the symptoms described in the history of present illness. He was evaluated in the context of the global COVID-19 pandemic, which necessitated consideration that the patient might be at risk for infection with the SARS-CoV-2 virus that causes COVID-19. Institutional protocols and algorithms that pertain to the evaluation of patients at risk for COVID-19 are in a state of rapid change based on information released by regulatory bodies including the CDC and federal and state organizations. These policies and algorithms were followed during the patient's care in the ED.     Patient comes in with left-sided weakness.  Symptoms are getting better but still has obvious weakness in within the tPA window therefore we will call stroke code.  Will get labs to  evaluate for Electra MIs, AKI, CT head evaluate for intercranial hemorrhage.  EKG to evaluate for any arrhythmia.  We will keep patient the cardiac monitor.  Patient also has rash over the left chest wall that looks consistent with shingles.  CT head is concerning for intraparenchymal/subarachnoid hemorrhage had a CTA recently but had no evidence of aneurysm but going to repeat again here.  patient is on a baby aspirin.  Discussed with Dr. Selina Cooley from neurology who wanted me discussed with neurosurgery concerning the concern for subarachnoid. Discuss with Myer Haff   Patient started on clevidipine infusion secondary to the hypertension with a goal of less than 140.  Patient reports he last took aspirin at noon yesterday.  This is been almost 22 hours.  Platelets are normal.  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  10:05 AM discussed with neurosurgery who recommends 500 mg of Keppra, blood pressure less than 160 and recommended transferring to [facility] due to concern for spontaneous subarachnoid.  No aneurysm on CTA per their review that would have caused this.  10:41 AM discussed with the neuro ICU over at [facility] and patient was accepted by Dr. Alverda Skeans   _________________________________   FINAL CLINICAL IMPRESSION(S) / ED DIAGNOSES   Final diagnoses:  SAH (subarachnoid hemorrhage) ([place])      MEDICATIONS GIVEN DURING THIS VISIT:  Medications  clevidipine (CLEVIPREX) infusion 0.5 mg/mL (2 mg/hr Intravenous Rate/Dose Change 03/08/21 1013)  levETIRAcetam (KEPPRA) IVPB 500 mg/100 mL premix (has no administration in time range)  iohexol (OMNIPAQUE) 350 MG/ML injection 75 mL (75 mLs Intravenous Contrast Given 03/08/21 [id number])     ED Discharge Orders     None        Note:  This document was  prepared using Dragon voice recognition software and may include unintentional dictation errors.    Vanessa Shannon, MD 03/08/21 [id number]

## 2021-03-08 NOTE — ED Notes (Signed)
Called [facility] spoke with Joseph Ortega in reference to potential Neuro ICU admit

## 2021-03-08 NOTE — Consult Note (Signed)
NEUROLOGY CONSULTATION NOTE   Date of service: March 08, 2021 Patient Name: Joseph Ortega MRN:  [id number] DOB:  [birth year] Reason for consult: stroke code Requesting physician: Dr. Marjean Donna _ _ _   _ __   _ __ _ _  __ __   _ __   __ _  History of Present Illness   65 yo gentleman not on anticoagulation on ASA 81mg  daily, DM2, HTN who was BIB EMS for acute onset L-sided weakness and dysarthria at 0730 today. He suddenly felt his legs buckle but did not fall to the ground or hit his head. L sided hemiplegia on EMS arrival improved to drift in LUE and LLE on neurologist examination in ED. NIHSS = 4 (LUE drift, LLE drift, dysarthria, VF decficits which are chronic). Head CT showed SAH with small 22mm IPH. 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 SBP currently 140s without antihypertensives.   ROS   Per HPI: all other systems reviewed and are negative  Past History   I have reviewed the following:  Past Medical History:  Diagnosis Date   Diabetes mellitus without complication ([place])    Hypertension    Thyroid disease    Past Surgical History:  Procedure Laterality Date   HERNIA REPAIR     No family history on file. Social History   Socioeconomic History   Marital status: Married    Spouse name: Not on file   Number of children: Not on file   Years of education: Not on file   Highest education level: Not on file  Occupational History   Not on file  Tobacco Use   Smoking status: Never   Smokeless tobacco: Never  Substance and Sexual Activity   Alcohol use: No   Drug use: No   Sexual activity: Not on file  Other Topics Concern   Not on file  Social History Narrative   Not on file   Social Determinants of Health   Financial Resource Strain: Not on file  Food Insecurity: Not on file  Transportation Needs: Not on file  Physical Activity: Not on file  Stress: Not on file  Social  Connections: Not on file   No Known Allergies  Medications   (Not in a hospital admission)     Current Facility-Administered Medications:    clevidipine (CLEVIPREX) infusion 0.5 mg/mL, 0-21 mg/hr, Intravenous, Continuous, Vanessa [place], MD, Last Rate: 2 mL/hr at 03/08/21 1002, 1 mg/hr at 03/08/21 1002   levETIRAcetam (KEPPRA) IVPB 500 mg/100 mL premix, 500 mg, Intravenous, Once, Vanessa [place], MD  Current Outpatient Medications:    doxazosin (CARDURA) 8 MG tablet, Take 1 tablet by mouth daily., Disp: , Rfl:    fluticasone (FLONASE) 50 MCG/ACT nasal spray, Place 1 spray into both nostrils daily., Disp: , Rfl:    valsartan (DIOVAN) 320 MG tablet, Take 1 tablet by mouth daily., Disp: , Rfl:    DULoxetine (CYMBALTA) 20 MG capsule, Take 20 mg by mouth 2 (two) times daily., Disp: , Rfl:    FARXIGA 10 MG TABS tablet, Take 10 mg by mouth daily., Disp: , Rfl:    gabapentin (NEURONTIN) 300 MG capsule, Take 300 mg by mouth 2 (two) times daily., Disp: , Rfl:    insulin detemir (LEVEMIR) 100 UNIT/ML injection, Inject 50 Units into the skin daily. , Disp: , Rfl:    levothyroxine (SYNTHROID, LEVOTHROID) 125 MCG tablet, Take 125 mcg  by mouth daily before breakfast., Disp: , Rfl:    metoprolol tartrate (LOPRESSOR) 50 MG tablet, Take 50 mg by mouth daily., Disp: , Rfl:    mirtazapine (REMERON) 15 MG tablet, Take 7.5 mg by mouth at bedtime., Disp: , Rfl:    NOVOLOG FLEXPEN 100 UNIT/ML FlexPen, Inject 10 Units into the skin in the morning, at noon, and at bedtime. Sliding scale, Disp: , Rfl:    traZODone (DESYREL) 50 MG tablet, Take 50 mg by mouth at bedtime., Disp: , Rfl:    TRULICITY 3 MG/0.5ML SOPN, Inject 3 mg into the skin every Thursday., Disp: , Rfl:    Vitamin D, Ergocalciferol, (DRISDOL) 50000 UNITS CAPS capsule, Take 50,000 Units by mouth every Sunday. sunday , Disp: , Rfl:   Vitals   Vitals:   03/08/21 0925 03/08/21 0930 03/08/21 0940 03/08/21 1000  BP: (!) 140/92 (!) 165/82 (!) 186/86 (!)  164/82  Pulse: 79 77 76 68  Resp: 19 19 (!) 31 17  SpO2: 98% 97% 96% 98%  Height:         Body mass index is 34.96 kg/m.  Physical Exam   Physical Exam Gen: A&O x4, NAD Resp: CTAB, no w/r/r CV: RRR, no m/g/r; nml S1 and S2. 2+ symmetric peripheral pulses.  Neuro: *MS: A&O x4. Follows multi-step commands.  *Speech: moderate dysarthria, no aphasia *CN:    I: Deferred   II,III: PERRLA, difficulty with lateral peripheral vision in L>R eyes, pt states is chronic   III,IV,VI: EOMI w/o nystagmus, no ptosis   V: Sensation intact from V1 to V3 to LT   VII: Eyelid closure was full.  Face symmetric   VIII: Hearing intact to voice   IX,X: Voice normal, palate elevates symmetrically    XI: SCM/trap 5/5 bilat   XII: Tongue protrudes midline, no atrophy or fasciculations   *Motor:   Normal bulk.  No tremor, rigidity or bradykinesia. RUE/RLE 5/5 strength throughout. LUE drift but not to bed, grip strength 4/5. LLE minimal drift.  *Sensory: Intact to light touch, pinprick, temperature vibration throughout. Symmetric. Propioception intact bilat.  No double-simultaneous extinction.  *Coordination:  FNF intact *Reflexes:  1+ and symmetric throughout without clonus; toes down-going bilat *Gait: deferred  NIHSS = 4 (dysarthria, VF, LUE, LLE)  Premorbid mRS = 0  Hunt and Hess: 1   Labs   CBC:  Recent Labs  Lab 03/08/21 0900  WBC 7.3  NEUTROABS 4.7  HGB 14.3  HCT 41.0  MCV 93.4  PLT 221    Basic Metabolic Panel:  Lab Results  Component Value Date   NA 138 03/08/2021   K 3.4 (L) 03/08/2021   CO2 29 03/08/2021   GLUCOSE 76 03/08/2021   BUN 19 03/08/2021   CREATININE 0.98 03/08/2021   CALCIUM 9.1 03/08/2021   GFRNONAA >60 03/08/2021   GFRAA >60 10/14/2019   Lipid Panel: No results found for: LDLCALC HgbA1c: No results found for: HGBA1C Urine Drug Screen: No results found for: LABOPIA, COCAINSCRNUR, LABBENZ, AMPHETMU, THCU, LABBARB  Alcohol Level No results found for:  [facility]   Impression   65 yo gentleman not on anticoagulation on ASA 81mg  daily BIB EMS for acute onset L-sided weakness and dysarthria at 0730 today. He suddenly felt his legs buckle but did not fall to the ground or hit his head. L sided hemiplegia on EMS arrival improved to drift in LUE and LLE on neurologist examination in ED. NIHSS = 4 (LUE drift, LLE drift, dysarthria, VF decficits which are  chronic). Head CT showed SAH with small 86mm IPH. 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 SBP currently 140s without antihypertensives.  Recommendations   - Consult to neurosurgery for [facility] - Further guidance per neurosurgery. In the meantime: - Clevidipine gtt for goal SBP<150 - Check CBC - SCDs for DVT prophylaxis - Head CT in 6 hrs assess stability - Seizure prophyaxis  - HOB elevated 30 degrees ______________________________________________________________________   This patient is critically ill and at significant risk of neurological worsening, death and care requires constant monitoring of vital signs, hemodynamics,respiratory and cardiac monitoring, neurological assessment, discussion with family, other specialists and medical decision making of high complexity. I spent 60 minutes of neurocritical care time  in the care of  this patient. This was time spent independent of any time provided by nurse practitioner or PA.  Su Monks, MD [facility] [phone]  If 7pm- 7am, please page neurology on call as listed in [place].

## 2021-03-08 NOTE — Progress Notes (Signed)
MRI and CT reviewed.  The infarct/hemorrhage pattern is unusual.  The patient has more left-sided weakness than was documented earlier, though he states that he is actually improving.  He has 3/5 strength in the left upper extremity, 4/5 in the left lower extremity.  Plan for formal angiogram in the morning with Dr. Corliss Skains for which I have placed the order.  This would also help to assess for VZV vasculitis.   For the time being strict blood pressure goal with systolic less than 140.  Also, I will order lidocaine patch for shingles area.    Ritta Slot, MD [facility] [phone]  If 7pm- 7am, please page neurology on call as listed in [place].

## 2021-03-08 NOTE — ED Notes (Signed)
[facility] at bedside 

## 2021-03-08 NOTE — Progress Notes (Signed)
Chaplain Maggie made initial visit at bedside in response to code stroke. Pt expressed concern for his wife who is at home. She is limited because of disabilities. Chaplain offered non-anxious presence and blessing from Numbers 6 as a benediction. Pt being transferred to [facility].  "The LORD bless you, [phone] keep you; the LORD make his face shine upon you and be gracious to you; the LORD turn his face toward you and give you peace."

## 2021-03-08 NOTE — Progress Notes (Signed)
Chaplain received consult indicating pt would like information on Advance Directives. Upon entry, Chaplain noticed AD paperwork on pt's bedside table, but Joseph Ortega has no recollection of how it got there or whether someone has spoken to him previously. He indicated that he would appreciate education. Chaplain provided education on Advance Directives, specifically Living Will and Health Care Power of [name]. PT is married and chaplain informed him that his spouse will be his health care agent unless she is unable to do so or he completes documentation indicating otherwise.  Chaplain provided pt with instructions on completing the paperwork, ways he can be more specific about his responses, and how to have the documents notarized whether he desires to do that while he is still inpatient or after discharge. Pt will notify his RN if he is ready to complete the documents and is ready for notarization. He is aware that notary services are typically available between 1:00 and 3:30 Monday-Thursday.  Please page as further needs arise.  Maryanna Shape. Carley Hammed, M.Div. [facility] Chaplain Pager [phone] Office [phone]

## 2021-03-08 NOTE — Code Documentation (Signed)
Stroke Response Nurse Documentation Code Documentation  Omir Cooprider is a 65 y.o. male arriving to [facility] ED via [facility] on 03/08/2021 from work with past medical hx of HTN. On antihypertensives at home but reports he is not always compliant with taking them. Code stroke was activated by ED RN.   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 States that he went into work at 0200 and was fine, had a sudden onset of left leg heaviness and felt his leg buckle. Pt did not fall.  Stroke team at the bedside on patient arrival. Labs drawn and patient cleared for CT by Dr. Fuller Plan. Patient to CT with team. NIHSS 3, see documentation for details and code stroke times. CT Head and  CTA head and neck completed. Patient is not a candidate for IV Thrombolytic due to hemorrhage noted on CTH.  Care/Plan: Pt to be seen by neurosurgery. SBP to remain less than 150 per Dr Selina Cooley.  ED RN Alaina at bedside with patient.    Beverly Milch Stroke Airline pilot

## 2021-03-08 NOTE — Consult Note (Signed)
CODE STROKE- PHARMACY COMMUNICATION   Time CODE STROKE called/page received:0910  Time response to CODE STROKE was made (in person or via phone): 0915  Time Stroke Kit retrieved from [place] (only if needed):TNK not indicated given [facility]   Past Medical History:  Diagnosis Date   Diabetes mellitus without complication ([place])    Hypertension    Thyroid disease    Prior to Admission medications   Medication Sig Start Date End Date Taking? Authorizing Provider  doxazosin (CARDURA) 8 MG tablet Take 1 tablet by mouth daily. 06/08/20  Yes [provider]  fluticasone (FLONASE) 50 MCG/ACT nasal spray Place 1 spray into both nostrils daily. 04/26/20  Yes [provider]  valsartan (DIOVAN) 320 MG tablet Take 1 tablet by mouth daily. 08/25/20  Yes [provider]  DULoxetine (CYMBALTA) 20 MG capsule Take 20 mg by mouth 2 (two) times daily. 10/06/19   [provider]  FARXIGA 10 MG TABS tablet Take 10 mg by mouth daily. 02/12/21   [provider]  gabapentin (NEURONTIN) 300 MG capsule Take 300 mg by mouth 2 (two) times daily. 04/28/19   [provider]  insulin detemir (LEVEMIR) 100 UNIT/ML injection Inject 50 Units into the skin daily.     [provider]  levothyroxine (SYNTHROID, LEVOTHROID) 125 MCG tablet Take 125 mcg by mouth daily before breakfast.    [provider]  metoprolol tartrate (LOPRESSOR) 50 MG tablet Take 50 mg by mouth daily. 09/14/19   [provider]  mirtazapine (REMERON) 15 MG tablet Take 7.5 mg by mouth at bedtime. 09/08/19   [provider]  NOVOLOG FLEXPEN 100 UNIT/ML FlexPen Inject 10 Units into the skin in the morning, at noon, and at bedtime. Sliding scale 10/10/19   [provider]  traZODone (DESYREL) 50 MG tablet Take 50 mg by mouth at bedtime. 02/05/21   [provider]  TRULICITY 3 NW/2.9FA SOPN Inject 3 mg into the skin every Thursday. 07/22/19   [provider]   Vitamin D, Ergocalciferol, (DRISDOL) 50000 UNITS CAPS capsule Take 50,000 Units by mouth every Sunday. sunday     [provider]  doxazosin (CARDURA) 8 MG tablet Take 8 mg by mouth daily. 09/20/19 03/08/21  [provider]  traMADol (ULTRAM) 50 MG tablet Take 50 mg by mouth 4 (four) times daily as needed. 01/25/21 03/08/21  [provider]    Narda Rutherford, PharmD Pharmacy Resident  03/08/2021 10:49 AM

## 2021-03-08 NOTE — Progress Notes (Signed)
Patient ID: Joseph Ortega, male   DOB: 21-Dec-1955, 65 y.o.   MRN: [id number] Asked to review CT scan the patient obtained at [facility] where patient was seen in the ER by both Neurology and Neurosurgery.  Do your surgery recommend the patient be transferred to higher level of care and he he was transferred here as opposed to [facility].    CT scan shows mild-to-moderate right hemispheric and sylvian subarachnoid hemorrhage.  This  does not have the appearance of a typical aneurysmal subarachnoid hemorrhage.  Patient has a known left paraclinoid infundibulum or small aneurysm that apparently is unchanged from imaging obtained in 2019.  And this would not have caused subarachnoid hemorrhage in that location.  There is no evidence of right-sided MCA or carotid aneurysm that could explain this subarachnoid hemorrhage on CTA.  In addition patient with left hemiparesis that cannot be explained by this hemorrhage either.  So putting all this together more than likely the patient has sustained a cerebrovascular event stroke with subsequent subarachnoid hemorrhage and should be worked up accordingly.  So recommend Neurology consult evaluation patient will need an MRI scan do not feel the patient needs an MRA with current CTA available.  If Neurology does not feel MRI and exam of patient is consistent with stroke, consideration can be given to angiography and Dr. Conchita Paris would be willing to perform that.

## 2021-03-08 NOTE — Consult Note (Signed)
I was called by Dr. Fuller Plan to review this patient.  He has onset of L weakness with R hemispheric SAH.  CTA negative on the R.  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  We do not have capability to care for vascular neurosurgical patients at [facility], so I have recommended that he transfer to a higher level of care where dedicated neuro-ICU care is available.  He may need a cerebral angiogram, which is not available at [facility].  In the interim I recommended SBP<160 and Keppra administration.  [facility] ICU has accepted the patient for transfer.  Venetia Night MD

## 2021-03-08 NOTE — Consult Note (Addendum)
WOC Nurse Consult Note: Reason for Consult: Consult requested for bilat legs.  Pt states he has been followed prior to admission by an outpatient wound care center for chronic stasis ulcers and compression wraps for edema.  Removed compression wraps to assess legs.  No further edema and full thickness wounds are almost healed.  Left anterior calf .5X.5X.1cm Right outer calf .8X.8X.1cm; both are pink and moist with scant amt drainage. No further need for compression wraps.  Plan: Topical treatment orders provided for bedside nurses to perform as follows: Foam dressing to bilat legs, change Q 3 days or PRN soiling. Please re-consult if further assistance is needed.  Thank-you,  Cammie Mcgee MSN, RN, CWOCN, [place], CNS [phone]

## 2021-03-08 NOTE — ED Notes (Signed)
EDP at bedside  

## 2021-03-08 NOTE — H&P (Signed)
NAME:  Kaitlin Ardito, MRN:  [id number], DOB:  11-10-1955, LOS: 0 ADMISSION DATE:  (Not on file), CONSULTATION DATE:  12/21 REFERRING MD:  Dr. Fuller Plan , CHIEF COMPLAINT:  SAH   History of Present Illness:  Patient is a 65 year old male with pertinent PMH of DMT2, HTN presents to [facility] ED on 12/21 with weakness.  Patient states on 12/21 at 2 AM he woke up and felt normal.  At 7:30 AM patient noticed sudden onset left-sided weakness.  Symptoms gradually improved.  Still having weakness in left arm.  Denies speech concerns.  Patient also has rash under left chest but appears to be shingles.  Code stroke called.  CT head shows 4 mm intraparenchymal hematoma in right posterior frontal lobe with subarachnoid penetration.  Left sided deficits and mental status stable since initial evaluation. Neurosurgery consulted.  Keppra and Cleviprex started SBP goal less than 160.  PCCM asked to admit to [facility] from [facility].  Pertinent  Medical History   Past Medical History:  Diagnosis Date   Diabetes mellitus without complication (HCC)    Hypertension    Thyroid disease     Significant Hospital Events: Including procedures, antibiotic start and stop dates in addition to other pertinent events   12/21: transferring from [facility] to [facility] for [facility]; on cleviprex  Interim History / Subjective:    Objective   There were no vitals taken for this visit.       No intake or output data in the 24 hours ending 03/08/21 1040 There were no vitals filed for this visit.  Examination: General appearance: 65 y.o., male, NAD, conversant  Eyes: anicteric sclerae; PERRL, tracking appropriately HENT: NCAT; MMM Neck: Trachea midline; no lymphadenopathy, no JVD Lungs: CTAB, no crackles, no wheeze, with normal respiratory effort CV: RRR, no murmur  Abdomen: Soft, non-tender; non-distended, BS present  Extremities: wraps over BLE Skin: vesicular rash which appears partially healed over left side Neuro: alert, appropriate, follows  commands, some weakness LUE, LLE   Labs: K 3.4  CT head: shows 4 mm intraparenchymal hematoma in right posterior frontal lobe with subarachnoid penetration  Resolved Hospital Problem list     Assessment & Plan:  R hemispheric SAH, IPH:  Hx of HTN CT head 12/21-4 mm intraparenchymal hematoma in right posterior frontal lobe with subarachnoid penetration. Unclear etiology of SAH - whether aneurysmal, CVA-related. Alternative but less likely consideration would be VZV vasculopathy given active shingles. P: -We will admit to neuro ICU -Neurosurgery consulted -Neurology consulted -MR brain wwo stat -consider cerebral angiogram -Started on Cleviprex; SBP goal less than 160 -continue keppra -Frequent neuro checks -Check lipid panel -Avoid fevers; prn tylenol -SSI for glucose goal less than 180 -PT/OT/SLP -uds, ua pending  Hypokalemia P:. -replete k -check mag -trend bmp and replete as needed  L Shingles: Left anterior chest around T10 dermatome but present for 3 days P: -defer treatment unless warranted in the unlikely scenario that he has evidence of VZV vasculopathy as well  DMT2 P: -ssi and cbg monitoring -check a1c  Hx of hypothyroidism P: -check tsh -continue home synthroid  Hx of depression? P: -will hold mirtazapine, trazodone, and duloxetine   Best Practice (right click and "Reselect all SmartList Selections" daily)   Diet/type: NPO w/ oral meds DVT prophylaxis: SCD GI prophylaxis: N/A Lines: N/A Foley:  N/A Code Status:  full code Last date of multidisciplinary goals of care discussion [updated at bedside 12/21]  Labs   CBC: Recent Labs  Lab 03/08/21 0900  WBC 7.3  NEUTROABS 4.7  HGB 14.3  HCT 41.0  MCV 93.4  PLT 221    Basic Metabolic Panel: Recent Labs  Lab 03/08/21 0900  NA 138  K 3.4*  CL 103  CO2 29  GLUCOSE 76  BUN 19  CREATININE 0.98  CALCIUM 9.1   GFR: CrCl cannot be calculated (Unknown ideal weight.). Recent Labs   Lab 03/08/21 0900  WBC 7.3    Liver Function Tests: Recent Labs  Lab 03/08/21 0900  AST 22  ALT 12  ALKPHOS 129*  BILITOT 0.9  PROT 7.2  ALBUMIN 3.5   No results for input(s): LIPASE, AMYLASE in the last 168 hours. No results for input(s): AMMONIA in the last 168 hours.  ABG No results found for: PHART, PCO2ART, PO2ART, HCO3, TCO2, ACIDBASEDEF, O2SAT   Coagulation Profile: Recent Labs  Lab 03/08/21 0900  INR 1.1    Cardiac Enzymes: No results for input(s): CKTOTAL, CKMB, CKMBINDEX, TROPONINI in the last 168 hours.  HbA1C: No results found for: HGBA1C  CBG: No results for input(s): GLUCAP in the last 168 hours.  Review of Systems:   12 point review of systems is negative except as in HPI  Past Medical History:  He,  has a past medical history of Diabetes mellitus without complication (HCC), Hypertension, and Thyroid disease.   Surgical History:   Past Surgical History:  Procedure Laterality Date   HERNIA REPAIR       Social History:   reports that he has never smoked. He has never used smokeless tobacco. He reports that he does not drink alcohol and does not use drugs.   Family History:  His family history is not on file.   Allergies No Known Allergies   Home Medications  Prior to Admission medications   Medication Sig Start Date End Date Taking? Authorizing Provider  doxazosin (CARDURA) 8 MG tablet Take 1 tablet by mouth daily. 06/08/20   [provider]  DULoxetine (CYMBALTA) 20 MG capsule Take 20 mg by mouth 2 (two) times daily. 10/06/19   [provider]  FARXIGA 10 MG TABS tablet Take 10 mg by mouth daily. 02/12/21   [provider]  fluticasone (FLONASE) 50 MCG/ACT nasal spray Place 1 spray into both nostrils daily. 04/26/20   [provider]  gabapentin (NEURONTIN) 300 MG capsule Take 300 mg by mouth 2 (two) times daily. 04/28/19   [provider]  insulin detemir (LEVEMIR) 100 UNIT/ML injection Inject  50 Units into the skin daily.     [provider]  levothyroxine (SYNTHROID, LEVOTHROID) 125 MCG tablet Take 125 mcg by mouth daily before breakfast.    [provider]  metoprolol tartrate (LOPRESSOR) 50 MG tablet Take 50 mg by mouth daily. 09/14/19   [provider]  mirtazapine (REMERON) 15 MG tablet Take 7.5 mg by mouth at bedtime. 09/08/19   [provider]  NOVOLOG FLEXPEN 100 UNIT/ML FlexPen Inject 10 Units into the skin in the morning, at noon, and at bedtime. Sliding scale 10/10/19   [provider]  traZODone (DESYREL) 50 MG tablet Take 50 mg by mouth at bedtime. 02/05/21   [provider]  TRULICITY 3 MG/0.5ML SOPN Inject 3 mg into the skin every Thursday. 07/22/19   [provider]  valsartan (DIOVAN) 320 MG tablet Take 1 tablet by mouth daily. 08/25/20   [provider]  Vitamin D, Ergocalciferol, (DRISDOL) 50000 UNITS CAPS capsule Take 50,000 Units by mouth every Sunday. sunday  [provider]     Critical care time: 40 minutes     Laroy Apple Pulmonary/Critical Care  03/08/2021, 10:40 AM  Please see [email or web address] for pager details.  From 7A-7P if no response, please call [phone]. After hours, please call ELink [phone].

## 2021-03-08 NOTE — ED Triage Notes (Signed)
Per EMS, Pt, from work, c/o L arm and L leg weakness X .  Pt reports "my leg felt like leaf and when I stood up, it wouldn't hold me."  Face is symmetrical.  Slight drift and weaker grip noted in L arm.  Pt also c/o pain rash under L breast x "a couple days."

## 2021-03-08 NOTE — Progress Notes (Signed)
Brief neurology stroke code note  65 yo gentleman not on anticoagulation on ASA 81mg  daily BIB EMS for acute onset L-sided weakness and dysarthria at 0730 today. He suddenly felt his legs buckle but did not fall to the ground or hit his head. L sided hemiplegia on EMS arrival improved to drift in LUE and LLE on neurologist examination in ED. NIHSS = 4 (LUE drift, LLE drift, dysarthria, VF decficits which are chronic). Head CT showed SAH with small 40mm IPH. CTA pending. SBP currently 140s without antihypertensives.  Recommendations - Consult to neurosurgery for [facility] - Further guidance per neurosurgery. In the meantime: - Clevidipine gtt for goal SBP<150 - F/u CTA  - Check CBC - SCDs for DVT prophylaxis - Head CT in 6 hrs assess stability - Seizure prophyaxis  - HOB elevated 30 degrees  Full neurology stroke code note to follow.  [facility], MD [facility] [phone]  If 7pm- 7am, please page neurology on call as listed in AMION.

## 2021-03-09 ENCOUNTER — Inpatient Hospital Stay (HOSPITAL_COMMUNITY): Payer: BC Managed Care – PPO

## 2021-03-09 HISTORY — PX: IR ANGIO INTRA EXTRACRAN SEL COM CAROTID INNOMINATE BILAT MOD SED: IMG5360

## 2021-03-09 HISTORY — PX: IR US GUIDE VASC ACCESS RIGHT: IMG2390

## 2021-03-09 HISTORY — PX: IR ANGIO VERTEBRAL SEL VERTEBRAL BILAT MOD SED: IMG5369

## 2021-03-09 LAB — BASIC METABOLIC PANEL
Anion gap: 9 (ref 5–15)
BUN: 16 mg/dL (ref 8–23)
CO2: 23 mmol/L (ref 22–32)
Calcium: 8.7 mg/dL — ABNORMAL LOW (ref 8.9–10.3)
Chloride: 103 mmol/L (ref 98–111)
Creatinine, Ser: 0.83 mg/dL (ref 0.61–1.24)
GFR, Estimated: >60 mL/min (ref >60)
Glucose, Bld: 246 mg/dL — ABNORMAL HIGH (ref 70–99)
Potassium: 3.6 mmol/L (ref 3.5–5.1)
Sodium: 135 mmol/L (ref 135–145)

## 2021-03-09 LAB — CBC
HCT: 43.9 % (ref 39.0–52.0)
Hemoglobin: 15 g/dL (ref 13.0–17.0)
MCH: 31.8 pg (ref 26.0–34.0)
MCHC: 34.2 g/dL (ref 30.0–36.0)
MCV: 93 fL (ref 80.0–100.0)
Platelets: 203 10*3/uL (ref 150–400)
RBC: 4.72 MIL/uL (ref 4.22–5.81)
RDW: 13.6 % (ref 11.5–15.5)
WBC: 5.5 10*3/uL (ref 4.0–10.5)
nRBC: 0 % (ref 0.0–0.2)

## 2021-03-09 LAB — GLUCOSE, CAPILLARY
Glucose-Capillary: 219 mg/dL — ABNORMAL HIGH (ref 70–99)
Glucose-Capillary: 248 mg/dL — ABNORMAL HIGH (ref 70–99)
Glucose-Capillary: 253 mg/dL — ABNORMAL HIGH (ref 70–99)
Glucose-Capillary: 255 mg/dL — ABNORMAL HIGH (ref 70–99)
Glucose-Capillary: 296 mg/dL — ABNORMAL HIGH (ref 70–99)
Glucose-Capillary: 333 mg/dL — ABNORMAL HIGH (ref 70–99)
Glucose-Capillary: 405 mg/dL — ABNORMAL HIGH (ref 70–99)

## 2021-03-09 MED ORDER — VERAPAMIL HCL 2.5 MG/ML IV SOLN
INTRAVENOUS | Status: AC
  Filled 2021-03-09: qty 2

## 2021-03-09 MED ORDER — CLEVIDIPINE BUTYRATE 0.5 MG/ML IV EMUL
INTRAVENOUS | Status: AC
  Filled 2021-03-09: qty 50

## 2021-03-09 MED ORDER — VERAPAMIL HCL 2.5 MG/ML IV SOLN
INTRA_ARTERIAL | Status: AC | PRN
  Administered 2021-03-09: 09:00:00 7 mL via INTRA_ARTERIAL

## 2021-03-09 MED ORDER — INSULIN DETEMIR 100 UNIT/ML ~~LOC~~ SOLN
10.0000 [IU] | Freq: Every day | SUBCUTANEOUS | Status: DC
  Administered 2021-03-09: 13:00:00 10 [IU] via SUBCUTANEOUS
  Filled 2021-03-09 (×2): qty 0.1

## 2021-03-09 MED ORDER — INSULIN ASPART 100 UNIT/ML IJ SOLN
0.0000 [IU] | INTRAMUSCULAR | Status: DC
  Administered 2021-03-10: 12:00:00 3 [IU] via SUBCUTANEOUS
  Administered 2021-03-10 (×3): 11 [IU] via SUBCUTANEOUS

## 2021-03-09 MED ORDER — MIDAZOLAM HCL 2 MG/2ML IJ SOLN
INTRAMUSCULAR | Status: AC | PRN
  Administered 2021-03-09: 1 mg via INTRAVENOUS

## 2021-03-09 MED ORDER — INSULIN ASPART 100 UNIT/ML IJ SOLN: 0.0000 [IU] | Freq: Three times a day (TID) | INTRAMUSCULAR | Status: DC

## 2021-03-09 MED ORDER — METOPROLOL TARTRATE 50 MG PO TABS
50.0000 mg | ORAL_TABLET | Freq: Every day | ORAL | Status: DC
  Administered 2021-03-10: 05:00:00 50 mg via ORAL
  Filled 2021-03-09: qty 1

## 2021-03-09 MED ORDER — FENTANYL CITRATE (PF) 100 MCG/2ML IJ SOLN
INTRAMUSCULAR | Status: AC
  Filled 2021-03-09: qty 2

## 2021-03-09 MED ORDER — SODIUM CHLORIDE 0.9 % IV SOLN: INTRAVENOUS | Status: AC

## 2021-03-09 MED ORDER — LEVOTHYROXINE SODIUM 25 MCG PO TABS
125.0000 ug | ORAL_TABLET | Freq: Every day | ORAL | Status: DC
  Administered 2021-03-09 – 2021-03-17 (×9): 125 ug via ORAL
  Filled 2021-03-09 (×9): qty 1

## 2021-03-09 MED ORDER — LIDOCAINE HCL 1 % IJ SOLN
INTRAMUSCULAR | Status: AC
  Filled 2021-03-09: qty 20

## 2021-03-09 MED ORDER — INSULIN ASPART 100 UNIT/ML IJ SOLN
0.0000 [IU] | Freq: Three times a day (TID) | INTRAMUSCULAR | Status: DC
  Administered 2021-03-09: 22:00:00 15 [IU] via SUBCUTANEOUS

## 2021-03-09 MED ORDER — MIDAZOLAM HCL 2 MG/2ML IJ SOLN
INTRAMUSCULAR | Status: AC
  Filled 2021-03-09: qty 4

## 2021-03-09 MED ORDER — IRBESARTAN 300 MG PO TABS
300.0000 mg | ORAL_TABLET | Freq: Every day | ORAL | Status: DC
  Administered 2021-03-09 – 2021-03-17 (×9): 300 mg via ORAL
  Filled 2021-03-09 (×3): qty 1
  Filled 2021-03-09: qty 2
  Filled 2021-03-09 (×2): qty 1
  Filled 2021-03-09: qty 2
  Filled 2021-03-09 (×2): qty 1

## 2021-03-09 MED ORDER — HEPARIN SODIUM (PORCINE) 1000 UNIT/ML IJ SOLN
INTRAMUSCULAR | Status: AC
  Filled 2021-03-09: qty 10

## 2021-03-09 MED ORDER — IOHEXOL 300 MG/ML  SOLN
100.0000 mL | Freq: Once | INTRAMUSCULAR | Status: AC | PRN
  Administered 2021-03-09: 09:00:00 50 mL via INTRA_ARTERIAL

## 2021-03-09 MED ORDER — IOHEXOL 300 MG/ML  SOLN
100.0000 mL | Freq: Once | INTRAMUSCULAR | Status: AC | PRN
  Administered 2021-03-09: 10:00:00 40 mL via INTRA_ARTERIAL

## 2021-03-09 MED ORDER — LABETALOL HCL 5 MG/ML IV SOLN
10.0000 mg | INTRAVENOUS | Status: DC | PRN
  Administered 2021-03-09 – 2021-03-17 (×4): 10 mg via INTRAVENOUS
  Filled 2021-03-09 (×5): qty 4

## 2021-03-09 MED ORDER — LIDOCAINE HCL 1 % IJ SOLN
INTRAMUSCULAR | Status: AC | PRN
  Administered 2021-03-09: 5 mL via INTRADERMAL

## 2021-03-09 MED ORDER — POTASSIUM CHLORIDE 10 MEQ/100ML IV SOLN
10.0000 meq | INTRAVENOUS | Status: AC
  Administered 2021-03-09 (×3): 10 meq via INTRAVENOUS
  Filled 2021-03-09 (×4): qty 100

## 2021-03-09 MED ORDER — FENTANYL CITRATE (PF) 100 MCG/2ML IJ SOLN
INTRAMUSCULAR | Status: AC | PRN
  Administered 2021-03-09: 25 ug via INTRAVENOUS

## 2021-03-09 MED ORDER — SODIUM CHLORIDE (PF) 0.9 % IJ SOLN
INTRAVENOUS | Status: AC | PRN
  Administered 2021-03-09: 10:00:00 200 ug via INTRA_ARTERIAL

## 2021-03-09 MED ORDER — VALACYCLOVIR HCL 500 MG PO TABS
1000.0000 mg | ORAL_TABLET | Freq: Three times a day (TID) | ORAL | Status: DC
  Administered 2021-03-09 – 2021-03-17 (×24): 1000 mg via ORAL
  Filled 2021-03-09 (×26): qty 2

## 2021-03-09 MED ORDER — CHLORHEXIDINE GLUCONATE CLOTH 2 % EX PADS
6.0000 | MEDICATED_PAD | Freq: Every day | CUTANEOUS | Status: DC
  Administered 2021-03-09 – 2021-03-17 (×8): 6 via TOPICAL

## 2021-03-09 NOTE — Consult Note (Signed)
Chief Complaint: Patient was seen in consultation today for diagnostic cerebral arteriogram  Referring Physician(s): Kirkpatrick,M  Supervising Physician: Julieanne Cotton  Patient Status: [facility] - In-pt  History of Present Illness: Joseph Ortega is a 65 y.o. male with past medical history of diabetes, hypertension, thyroid disease who was admitted to [facility] on 12/21 with acute onset left-sided weakness and dysarthria.  Patient felt his legs buckle but did not fall to the ground or hit his head.  On presentation he had left-sided hemiplegia.  CT head showed subarachnoid hemorrhage with small 4 mm IPH.  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 This hemorrhage has undergone subarachnoid extension and there is subarachnoid blood in the sulci of the right sylvian fissure and posterior frontal region.   Chronic small-vessel ischemic changes elsewhere throughout the brain as outlined above, many associated with punctate hemosiderin deposition related to old microhemorrhage.  Patient continues to have left-sided weakness.  He denies fever, headache, chest pain, dyspnea, cough, abdominal/back pain, nausea, vomiting or bleeding.  He denies visual changes or any right-sided weakness.  Speech is appropriate.  He is alert and oriented.  Labs include normal CBC today, creatinine normal, PT/INR normal.  He is afebrile and BP is okay.  Request now received from neurology for diagnostic cerebral arteriogram. Past Medical History:  Diagnosis Date   Diabetes mellitus without complication (HCC)    Hypertension    Thyroid disease     Past Surgical History:  Procedure Laterality Date   HERNIA REPAIR       Allergies: Patient has no known allergies.  Medications: Prior to Admission medications   Medication Sig Start Date End Date Taking? Authorizing Provider  doxazosin (CARDURA) 8 MG tablet Take 1 tablet by mouth daily. 06/08/20   [provider]  DULoxetine (CYMBALTA) 20 MG capsule Take 20 mg by mouth 2 (two) times daily. 10/06/19   [provider]  FARXIGA 10 MG TABS tablet Take 10 mg by mouth daily. 02/12/21   [provider]  fluticasone (FLONASE) 50 MCG/ACT nasal spray Place 1 spray into both nostrils daily. 04/26/20   [provider]  gabapentin (NEURONTIN) 300 MG capsule Take 300 mg by mouth 2 (two) times daily. 04/28/19   [provider]  insulin detemir (LEVEMIR) 100 UNIT/ML injection Inject 50 Units into the skin daily.     [provider]  levothyroxine (SYNTHROID, LEVOTHROID) 125 MCG tablet Take 125 mcg by mouth daily before breakfast.    [provider]  metoprolol tartrate (LOPRESSOR) 50 MG tablet Take 50 mg by mouth daily. 09/14/19   [provider]  mirtazapine (REMERON) 15 MG tablet Take 7.5 mg by mouth at bedtime. 09/08/19   [provider]  NOVOLOG FLEXPEN 100 UNIT/ML FlexPen Inject 10 Units into the skin in the morning, at noon, and at bedtime. Sliding scale 10/10/19   [provider]  traZODone (DESYREL) 50 MG tablet Take 50 mg by mouth at bedtime. 02/05/21   [provider]  TRULICITY 3 MG/0.5ML SOPN Inject 3 mg into the skin every Thursday. 07/22/19   [provider]  valsartan (DIOVAN) 320 MG tablet Take 1 tablet by mouth daily. 08/25/20   [provider]  Vitamin D, Ergocalciferol, (DRISDOL) 50000 UNITS  CAPS capsule Take 50,000 Units by mouth every Sunday. sunday     [provider]     No family history on file.  Social History   Socioeconomic History   Marital status: Married    Spouse name: Not on file   Number of children: Not on file   Years  of education: Not on file   Highest education level: Not on file  Occupational History   Not on file  Tobacco Use   Smoking status: Never   Smokeless tobacco: Never  Substance and Sexual Activity   Alcohol use: No   Drug use: No   Sexual activity: Not on file  Other Topics Concern   Not on file  Social History Narrative   Not on file   Social Determinants of Health   Financial Resource Strain: Not on file  Food Insecurity: Not on file  Transportation Needs: Not on file  Physical Activity: Not on file  Stress: Not on file  Social Connections: Not on file     Review of Systems see above  Vital Signs: BP 129/71    Pulse 67    Temp 97.8 F (36.6 C) (Axillary)    Resp 14    SpO2 97%   Physical Exam awake, alert.  Chest clear to auscultation bilaterally.  Heart with regular rate and rhythm.  Abdomen soft, positive bowel sounds, nontender.  Pretibial edema noted bilaterally.  Face symmetrical, tongue midline, speech currently okay, noted left upper and lower extremity weakness; right upper and lower extremities with normal strength and sensation.  Imaging: DG Tibia/Fibula Left  Result Date: 02/10/2021 CLINICAL DATA:  Diabetic ulcer. EXAM: LEFT TIBIA AND FIBULA - 2 VIEW COMPARISON:  None. FINDINGS: There is no evidence of fracture or other focal bone lesions. No lytic destruction is seen to suggest osteomyelitis. Soft tissues are unremarkable. IMPRESSION: Negative. Electronically Signed   By: Lupita Raider M.D.   On: 02/10/2021 16:11   MR BRAIN W WO CONTRAST  Result Date: 03/08/2021 CLINICAL DATA:  Subarachnoid hemorrhage. Active herpes zoster. Acute presentation with weakness yesterday. Weakness is left-sided. EXAM: MRI HEAD WITHOUT AND WITH CONTRAST TECHNIQUE: Multiplanar, multiecho pulse sequences of the brain and surrounding structures were obtained without and with intravenous contrast. CONTRAST:  10mL GADAVIST GADOBUTROL 1 MMOL/ML IV SOLN COMPARISON:  CT studies same day.   MRI 06/17/2017. FINDINGS: Brain: Diffusion imaging shows acute infarction at the right posterior frontal region affecting the gyrus and underlying white matter. Focal hemorrhage in this location which resulted in subarachnoid extension. No other acute infarction. Elsewhere, there chronic small-vessel ischemic changes of the pons and cerebellum. Punctate foci of hemosiderin deposition related to old microhemorrhages. Cerebral hemispheres elsewhere show chronic small-vessel ischemic change of the white matter with scattered micro hemorrhagic hemosiderin deposition. No extra-axial collection. No hydrocephalus. No neoplastic mass lesion. Vascular: Major vessels at the base of the brain show flow. 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 This hemorrhage has undergone subarachnoid extension and there is subarachnoid blood in the sulci of the right sylvian fissure and posterior frontal region. Chronic small-vessel ischemic changes elsewhere throughout the brain as outlined above, many associated with punctate hemosiderin deposition related to old microhemorrhage. Electronically Signed   By: Paulina Fusi M.D.   On: 03/08/2021 15:32   VAS [place] ABI WITH/WO TBI  Result Date: 02/24/2021  LOWER EXTREMITY DOPPLER STUDY Patient  Name:  BRAZOS SANDOVAL  Date of Exam:   02/23/2021 Medical Rec #: [id number]     Accession #:    [id number] Date of Birth: [birth year]     Patient Gender: M Patient Age:   51 years Exam Location:  [facility] Procedure:      VAS [place] ABI WITH/WO TBI Referring Phys: HOYT STONE III --------------------------------------------------------------------------------  Indications: Ulceration.  Performing Technologist: Thereasa Parkin RVT  Examination Guidelines: A complete evaluation includes at minimum, Doppler waveform signals and  systolic blood pressure reading at the level of bilateral brachial, anterior tibial, and posterior tibial arteries, when vessel segments are accessible. Bilateral testing is considered an integral part of a complete examination. Photoelectric Plethysmograph (PPG) waveforms and toe systolic pressure readings are included as required and additional duplex testing as needed. Limited examinations for reoccurring indications may be performed as noted.  ABI Findings: +---------+------------------+-----+---------+--------+  Right     Rt Pressure (mmHg) Index Waveform  Comment   +---------+------------------+-----+---------+--------+  Brachial  184                                          +---------+------------------+-----+---------+--------+  PTA       255                1.39  triphasic           +---------+------------------+-----+---------+--------+  DP        255                1.39  triphasic           +---------+------------------+-----+---------+--------+  Great Toe 174                0.95                      +---------+------------------+-----+---------+--------+ +---------+------------------+-----+---------+-------+  Left      Lt Pressure (mmHg) Index Waveform  Comment  +---------+------------------+-----+---------+-------+  Brachial  180                                         +---------+------------------+-----+---------+-------+  PTA       255                1.39  triphasic          +---------+------------------+-----+---------+-------+  DP        255                1.39  triphasic          +---------+------------------+-----+---------+-------+  Great Toe 196                1.07                     +---------+------------------+-----+---------+-------+ +-------+-----------+-----------+------------+------------+  ABI/TBI Today's ABI Today's TBI Previous ABI Previous TBI  +-------+-----------+-----------+------------+------------+  Right   [place]          0.95                                    +-------+-----------+-----------+------------+------------+  Left    [place]          1.07                                   +-------+-----------+-----------+------------+------------+  No previous ABI.  Summary: Right: Resting right ankle-brachial index indicates noncompressible right lower extremity arteries. The right toe-brachial index is normal. Pedal waveforms are normal. Left: Resting left ankle-brachial index indicates noncompressible left lower extremity arteries. The left toe-brachial index is normal. Pedal waveforms are normal.  *See table(s) above for measurements and observations.  Electronically signed by Gerarda Fraction on 02/24/2021 at 5:44:06 PM.    Final    CT HEAD CODE STROKE WO CONTRAST  Result Date: 03/08/2021 CLINICAL DATA:  Code stroke. Neurological deficit. Acute stroke suspected. Left-sided weakness. EXAM: CT HEAD WITHOUT CONTRAST TECHNIQUE: Contiguous axial images were obtained from the base of the skull through the vertex without intravenous contrast. COMPARISON:  MRI 06/17/2017.  CT studies 06/16/2017. FINDINGS: Brain: The brainstem and cerebellum are normal. Mild chronic small-vessel ischemic changes are seen within the cerebral hemispheric white matter. There is acute subarachnoid hemorrhage within the sulci of the right sylvian fissure and posterior frontal region. There appears to be a small intraparenchymal hemorrhage measuring 4 mm in size in the right posterior frontal region that may be the source of this hemorrhage. CT angiography in 2019 did not show an aneurysm. No hydrocephalus. No extra-axial collection. Vascular: There is atherosclerotic calcification of the major vessels at the base of the brain. Skull: Negative Sinuses/Orbits: Clear/normal Other: None ASPECTS ([place]berta Stroke Program Early CT Score) - Ganglionic level infarction (caudate, lentiform nuclei, internal capsule, insula, M1-M3 cortex): 7 - Supraganglionic infarction (M4-M6 cortex): 3 Total score (0-10 with 10 being  normal): 10 IMPRESSION: 1. 4 mm intraparenchymal hematoma in the right posterior frontal lobe, apparently with subarachnoid penetration. Subarachnoid blood within the sulci of the right posterior frontal region and right sylvian fissure. 2. This pattern of hemorrhage would ordinarily raise concern about a berry aneurysm, but no aneurysm was seen on CT angiography 06/16/2017. Additionally, the suspected presence of a intraparenchymal hemorrhage with subarachnoid extension could explain this pattern. 3. ASPECTS is 10 4. These results were communicated to Dr. Selina Cooley At 9:19 am on 03/08/2021 by text page via the [facility] messaging system. Electronically Signed   By: Paulina Fusi M.D.   On: 03/08/2021 09:21   CT ANGIO HEAD NECK W WO CM (CODE STROKE)  Result Date: 03/08/2021 CLINICAL DATA:  Provided history: Neuro deficit, acute, stroke suspected. Left-sided weakness. EXAM: CT ANGIOGRAPHY HEAD AND NECK TECHNIQUE: Multidetector CT imaging of the head and neck was performed using the standard protocol during bolus administration of intravenous contrast. Multiplanar CT image reconstructions and MIPs were obtained to evaluate the vascular anatomy. Carotid stenosis measurements (when applicable) are obtained utilizing NASCET criteria, using the distal internal carotid diameter as the denominator. CONTRAST:  63mL OMNIPAQUE IOHEXOL 350 MG/ML SOLN COMPARISON:  Noncontrast head CT performed earlier today 03/08/2021. CT angiogram head/neck 06/16/2017. FINDINGS: CTA NECK FINDINGS Aortic arch: Standard aortic branching. Atherosclerotic plaque within the visualized aortic arch and proximal major branch vessels of the neck. Streak and beam hardening artifact arising from a dense right-sided contrast bolus partially obscures the right subclavian artery. Within limitation, no hemodynamically significant innominate or proximal subclavian artery stenosis is identified. Right carotid system: CCA and ICA patent within the neck without  stenosis. Mild atherosclerotic plaque about the carotid bifurcation. Left carotid system: CCA and ICA patent within the neck without stenosis. Mild sclerotic plaque within the mid to distal CCA and about the carotid bifurcation. Vertebral arteries: Vertebral arteries codominant and patent within the neck. Nonstenotic calcified plaque at the origin of the right vertebral artery. Apparent moderate/severe stenosis  at the origin of the left vertebral artery. Skeleton: Nonspecific heterogeneous, mildly sclerotic appearance of the bones, similar as compared to the prior examination of 06/16/2017. Cervical spondylosis. Mild reversal of the expected cervical lordosis. 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 10 mm cutaneous/subcutaneous lesion within the right cheek soft tissues (overlying the anterior aspect of the right parotid gland), increased in size as compared to the prior CTA (for instance as seen on series 6, image 216). Upper chest: No consolidation within the imaged lung apices. Review of the MIP images confirms the above findings CTA HEAD FINDINGS Anterior circulation: The intracranial internal carotid arteries are patent. Nonstenotic calcified plaque within both vessels. The M1 middle cerebral arteries are patent. Atherosclerotic irregularity of the M2 and more distal middle cerebral arteries, bilaterally. However, no M2 proximal branch occlusion or high-grade proximal stenosis is identified. The anterior cerebral arteries are patent. 1-2 mm inferiorly projecting vascular protrusion arising from the paraclinoid left ICA, which may reflect and infundibulum or aneurysm (series 6, image 248) (series 10, image 113). This is unchanged from the prior examination of 06/16/2017. No evidence of aneurysm or AVM elsewhere. Posterior circulation: The intracranial vertebral  arteries are patent. The basilar artery is patent. The posterior cerebral arteries are patent. As before, there are sites of up to severe stenosis within the P2 left PCA. Posterior communicating arteries are diminutive or absent bilaterally. Venous sinuses: Within the limitations of contrast timing, no convincing thrombus. Anatomic variants: As described. Review of the MIP images confirms the above findings CTA head impressions #1 and #2 called by telephone at the time of interpretation on 03/08/2021 at 9:54 am to provider [facility] , who verbally acknowledged these results. IMPRESSION: CTA neck: 1. The common carotid and internal carotid arteries are patent within the neck without stenosis. Mild atherosclerotic plaque within the bilateral carotid systems within the neck, as described. 2. Vertebral arteries patent within the neck. 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 3. 2 cm right parotid gland mass, not significantly changed in size as compared to the prior CTA head/neck of 06/16/2017. 4. 10 mm cutaneous/subcutaneous lesion within the right cheek soft tissues (overlying the anterior right parotid gland), increased in size. Direct visualization recommended (with direct tissue sampling, as warranted). 5.  Aortic Atherosclerosis (ICD10-I70.0). CTA head: 1. No intracranial large vessel occlusion or proximal high-grade arterial stenosis. 2. 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 This is remote from the site of subarachnoid and parenchymal hemorrhage. 3. Similar to the prior CTA, there are sites of up to severe stenosis within the P2 left PCA. Electronically Signed   By: Jackey Loge D.O.   On: 03/08/2021 09:57    Labs:  CBC: Recent Labs    03/08/21 0900 03/09/21 0130  WBC 7.3 5.5  HGB 14.3 15.0  HCT 41.0 43.9  PLT 221 203    COAGS: Recent Labs    03/08/21 0900  INR 1.1  APTT 30     BMP: Recent Labs    03/08/21 0900 03/09/21 0130  NA 138 135  K 3.4* 3.6  CL 103 103  CO2 29 23  GLUCOSE 76 246*  BUN 19 16  CALCIUM 9.1 8.7*  CREATININE 0.98 0.83  GFRNONAA >60 >60  LIVER FUNCTION TESTS: Recent Labs    03/08/21 0900  BILITOT 0.9  AST 22  ALT 12  ALKPHOS 129*  PROT 7.2  ALBUMIN 3.5    TUMOR MARKERS: No results for input(s): AFPTM, CEA, CA199, CHROMGRNA in the last 8760 hours.  Assessment and Plan: 65 y.o. male with past medical history of diabetes, hypertension, thyroid disease who was admitted to [facility] on 12/21 with acute onset left-sided weakness and dysarthria.  Patient felt his legs buckle but did not fall to the ground or hit his head.  On presentation he had left-sided hemiplegia.  CT head showed subarachnoid hemorrhage with small 4 mm IPH.  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 This hemorrhage has undergone subarachnoid extension and there is subarachnoid blood in the sulci of the right sylvian fissure and posterior frontal region.   Chronic small-vessel ischemic changes elsewhere throughout the brain as outlined above, many associated with punctate hemosiderin deposition related to old microhemorrhage.  Patient continues to have left-sided weakness.  He denies fever, headache, chest pain, dyspnea, cough, abdominal/back pain, nausea, vomiting or bleeding.  He denies visual changes or any right-sided weakness.  Speech is appropriate.  He is alert and oriented.  Labs include normal CBC today, creatinine normal, PT/INR normal.  He is afebrile and BP is okay.  Request now received from neurology for diagnostic cerebral arteriogram.Risks and benefits of  procedure  were discussed with the patient including, but not limited to bleeding, infection, vascular injury or contrast induced renal failure.  This interventional procedure involves the use of X-rays and because of the nature of the planned procedure, it is possible that we will have prolonged use of X-ray fluoroscopy.  Potential radiation risks to you include (but are not limited to) the following: - A slightly elevated risk for cancer  several years later in life. This risk is typically less than 0.5% percent. This risk is low in comparison to the normal incidence of human cancer, which is 33% for women and 50% for men according to the [employer] Society. - Radiation induced injury can include skin redness, resembling a rash, tissue breakdown / ulcers and hair loss (which can be temporary or permanent).   The likelihood of either of these occurring depends on the difficulty of the procedure and whether you are sensitive to radiation due to previous procedures, disease, or genetic conditions.   IF your procedure requires a prolonged use of radiation, you will be notified and given written instructions for further action.  It is your responsibility to monitor the irradiated area for the 2 weeks following the procedure and to notify your physician if you are concerned that you have suffered a radiation induced injury.    All of the patient's questions were answered, patient is agreeable to proceed.  Consent signed and in chart.  Procedure scheduled for today    Thank you for this interesting consult.  I greatly enjoyed meeting Anh Mangano and look forward to participating in their care.  A copy of this report was sent to the requesting provider on this date.  Electronically Signed: D. Jeananne Rama, PA-C 03/09/2021, 8:18 AM   I spent a total of  25 minutes   in face to face in clinical  consultation, greater than 50% of which was counseling/coordinating care for diagnostic  cerebral arteriogram

## 2021-03-09 NOTE — Progress Notes (Signed)
°  Transition of Care [facility]) Screening Note   Patient Details  Name: Joseph Ortega Date of Birth: 1955-04-30   Transition of Care [facility]) CM/SW Contact:    Glennon Mac, RN Phone Number: 03/09/2021, 3:43 PM    Transition of Care Department [facility]) has reviewed patient and no TOC needs have been identified at this time. We will continue to monitor patient advancement through interdisciplinary progression rounds. If new patient transition needs arise, please place a TOC consult.  Quintella Baton, RN, BSN  Trauma/Neuro ICU Case Manager [phone]

## 2021-03-09 NOTE — Progress Notes (Signed)
[facility] ADULT ICU REPLACEMENT PROTOCOL   The patient does apply for the [facility] Adult ICU Electrolyte Replacment Protocol based on the criteria listed below:   1.Exclusion criteria: TCTS patients, ECMO patients, and Dialysis patients 2. Is GFR >/= 30 ml/min? Yes.    Patient's GFR today is >60 3. Is SCr </= 2? Yes.   Patient's SCr is 0.83 mg/dL 4. Did SCr increase >/= 0.5 in 24 hours? No. 5.Pt's weight >40kg  Yes.   6. Abnormal electrolyte(s): K=3.6  7. Electrolytes replaced per protocol 8.  Call MD STAT for K+ </= 2.5, Phos </= 1, or Mag </= 1 Physician:  Enzo Bi 03/09/2021 3:53 AM

## 2021-03-09 NOTE — Progress Notes (Addendum)
NAME:  Joseph Ortega, MRN:  [id number], DOB:  11/06/1955, LOS: 1 ADMISSION DATE:  03/08/2021, CONSULTATION DATE:  12/21 REFERRING MD:  Dr. Fuller Plan , CHIEF COMPLAINT:  SAH   History of Present Illness:  Patient is a 65 year old male with pertinent PMH of DMT2, HTN presents to [facility] ED on 12/21 with weakness.  Patient states on 12/21 at 2 AM he woke up and felt normal.  At 7:30 AM patient noticed sudden onset left-sided weakness.  Symptoms gradually improved.  Still having weakness in left arm.  Denies speech concerns.  Patient also has rash under left chest but appears to be shingles.  Code stroke called.  CT head shows 4 mm intraparenchymal hematoma in right posterior frontal lobe with subarachnoid penetration.  Left sided deficits and mental status stable since initial evaluation. Neurosurgery consulted.  Keppra and Cleviprex started SBP goal less than 160.  PCCM asked to admit to [facility] from [facility].  Pertinent  Medical History   Past Medical History:  Diagnosis Date   Diabetes mellitus without complication (HCC)    Hypertension    Thyroid disease     Significant Hospital Events: Including procedures, antibiotic start and stop dates in addition to other pertinent events   12/21: transferring from [facility] to [facility] for [facility]; on cleviprex  Interim History / Subjective:  Taken for angiography this AM without clear evidence of vascular anomaly.   Home BiPAP overnight   Objective   Blood pressure 132/73, pulse 86, temperature 97.6 F (36.4 C), temperature source Axillary, resp. rate (!) 24, SpO2 92 %.    FiO2 (%):  [30 %] 30 %   Intake/Output Summary (Last 24 hours) at 03/09/2021 1214 Last data filed at 03/09/2021 1016 Gross per 24 hour  Intake 1220.3 ml  Output 1200 ml  Net 20.3 ml   There were no vitals filed for this visit.  Examination: General appearance: 65 y.o., male, NAD, conversant  Eyes: anicteric sclerae; PERRL, tracking appropriately HENT: NCAT; MMM Neck: Trachea midline; no  lymphadenopathy, no JVD Lungs: CTAB, no crackles, no wheeze, with normal respiratory effort CV: RRR, no murmur  Abdomen: Soft, non-tender; non-distended, BS present  Extremities: 1+ BLE edema, +venous stasis changes BLE  Skin: vesicular rash which appears partially healed over left side Neuro: alert appropriate, LUE, LLE weakness   Glucose above target  CT head: shows 4 mm intraparenchymal hematoma in right posterior frontal lobe with subarachnoid penetration  Resolved Hospital Problem list     Assessment & Plan:  R hemispheric SAH, IPH:  Hx of HTN CT head 12/21-4 mm intraparenchymal hematoma in right posterior frontal lobe with subarachnoid penetration. Unclear etiology of SAH - there may be bland cortical vein thrombus leading to infarct but alternative consideration would be VZV vasculopathy given active shingles. P: -f/u [facility] tomorrow AM -We will admit to neuro ICU -Neurosurgery consulted -Neurology consulted -Started on Cleviprex; SBP goal less than 140, adding home antihypertensives as below -ID consulted - ?VZV vasculopathy -start valtrex 1g TID for 2-3 weeks as below -continue keppra -Frequent neuro checks -tighten up glycemic control -Avoid fevers; prn tylenol -SSI for glucose goal less than 180 -PT/OT -uds, ua pending  L Shingles: Left anterior chest around T10 dermatome but present for 3 days P: -although past 3 days onset will start valtrex 1g TID for 2-3 weeks in case represents VZV vasculopathy manifestation  DMT2 P: -ssi and cbg monitoring -start levemir -check a1c  Hx of hypothyroidism P: -check tsh -continue home synthroid  OSA:  -  home BiPAP at night and naps  Hx of depression? P: -will hold mirtazapine, trazodone, and duloxetine   Best Practice (right click and "Reselect all SmartList Selections" daily)   Diet/type: Regular consistency (see orders) DVT prophylaxis: SCD GI prophylaxis: N/A Lines: N/A Foley:  N/A Code Status:  full  code Last date of multidisciplinary goals of care discussion [updated at bedside 12/21]   Critical care time: 31 minutes    Laroy Apple Pulmonary/Critical Care  03/09/2021, 12:14 PM  Please see [email or web address] for pager details.  From 7A-7P if no response, please call [phone]. After hours, please call ELink [phone].

## 2021-03-09 NOTE — Procedures (Signed)
INR. S/P 4 vessel cerebral arteriogram Rt Rad approach. Findings. 1.No angio evidence of of DAVF,AV shunting,AVM ,aneurysms or of dissections. 2. Venous outflow WNLS. S.Ralph Brouwer MD

## 2021-03-09 NOTE — Progress Notes (Signed)
eLink Physician-Brief Progress Note Patient Name: Calixto Pavel DOB: [birth year] MRN: [id number]   Date of Service  03/09/2021  HPI/Events of Note  Notified of glucose > 400 on low dose SSI Also on Levemir 10 Creatinine 0.83  eICU Interventions  Increased SSI to moderate dose     Intervention Category Intermediate Interventions: Hyperglycemia - evaluation and treatment  Rosalie Gums Tiernan Suto 03/09/2021, 8:34 PM

## 2021-03-09 NOTE — Progress Notes (Signed)
STROKE TEAM PROGRESS NOTE   INTERVAL HISTORY Patient seen in room post cerebral angiogram.  Patient is on Cleviprex for blood pressure control.  Home meds restarted for HTN.  Patient appears hemodynamically stable after angiogram.  Patient does have shingles and CCM is starting Valtrax 1g TID. BP systolic 130-150 for 24 hours, then 160 systolic.  Cerebral catheter angiogram is negative for any aneurysm, AVM, dural AV fistula. MRI scan of the brain shows right posterior frontal vertex localized parenchymal hemorrhage with hemorrhagic infarct and subarachnoid extension of blood in the right sylvian fissure and posterior frontal region.  Few areas of old chronic microhemorrhages and small vessel disease noted as well. Vitals:   03/09/21 1430 03/09/21 1445 03/09/21 1500 03/09/21 1503  BP: 117/80 132/86 (!) 141/90   Pulse: 77 72 71 80  Resp: (!) 28 (!) 26 19 14   Temp:      TempSrc:      SpO2: (!) 89% (!) 89% (!) 84% 91%   CBC:  Recent Labs  Lab 03/08/21 0900 03/09/21 0130  WBC 7.3 5.5  NEUTROABS 4.7  --   HGB 14.3 15.0  HCT 41.0 43.9  MCV 93.4 93.0  PLT 221 203   Basic Metabolic Panel:  Recent Labs  Lab 03/08/21 0900 03/08/21 1528 03/09/21 0130  NA 138  --  135  K 3.4*  --  3.6  CL 103  --  103  CO2 29  --  23  GLUCOSE 76  --  246*  BUN 19  --  16  CREATININE 0.98  --  0.83  CALCIUM 9.1  --  8.7*  MG  --  2.2  --    Lipid Panel: No results for input(s): CHOL, TRIG, HDL, CHOLHDL, VLDL, LDLCALC in the last 168 hours. HgbA1c: No results for input(s): HGBA1C in the last 168 hours. Urine Drug Screen:  Recent Labs  Lab 03/08/21 1003  LABOPIA NONE DETECTED  COCAINSCRNUR NONE DETECTED  LABBENZ NONE DETECTED  AMPHETMU NONE DETECTED  THCU NONE DETECTED  LABBARB NONE DETECTED    Alcohol Level No results for input(s): ETH in the last 168 hours.  IMAGING past 24 hours No results found.  PHYSICAL EXAM Pleasant middle-aged Caucasian male not in distress. . Afebrile. Head  is nontraumatic. Neck is supple without bruit.    Cardiac exam no murmur or gallop. Lungs are clear to auscultation. Distal pulses are well felt.  Neurological Exam: Is awake alert oriented to time place and person.  No aphasia apraxia or dysarthria.  Right gaze preference but able to look to the left past midline.  Blinks to threat on the right but not as much on the left.  Left lower facial weakness.  Tongue midline.  Left hemiplegia with left upper extremity strength on the 2-3/5  .  Left lower extremity 3/5 with slight drift.  Sensation preserved bilaterally.  Reflexes depressed on the left normal on the right.  Gait not tested. ASSESSMENT/PLAN Joseph Ortega is a 65 y.o. male with history of Type 2 diabetes, hypertension, hypothyroidism. Patient states on 12/21 at 2 AM he woke up and felt normal.  At 7:30 AM patient noticed sudden onset left-sided weakness.  Symptoms gradually improved.  Still having weakness in left arm.  Denies speech concerns.  Patient also has rash under left chest  appears to be shingles.  CT head shows 4 mm intraparenchymal hematoma in right posterior frontal lobe with subarachnoid penetration.  Left sided deficits and mental status stable since initial  evaluation. Neurosurgery consulted.  Keppra and Cleviprex started SBP goal 130-150 for 24 hours.  Right frontal localized intraparenchymal hemorrhage with subarachnoid hemorrhage and underlying hemorrhagic infarct etiology indeterminate possibly secondary to venous angioma which has bled.  Doubt zoster vasculopathy Code Stroke SAH with 33mm IPH CTA head & neck 1-63mm infundibulum vs aneurysm arising from paraclinoid L ICA 12/22- Cerebral angio -No angio evidence of of DAVF,AV shunting,AVM ,aneurysms or of dissections or vasculitis MRI localized acute infarction at the right posterior frontal vertex with a 6 mm associated IPH.  Subarachnoid extension and subarachnoid blood in the sulci of the right sylvian fissure and posterior  frontal region VTE prophylaxis - SCDs Ha1C- Pending LDL- Pending No antithrombotic prior to admission, now on No antithrombotic.  Therapy recommendations: Pending Disposition: Pending Keppra 500mg  q 12hr  Hypertension Home meds: Valsartan 320mg , metoprolol tartrate 50 mg Stable 130-150 for 24 hours then relax goal to 160 Long-term BP goal normotensive Start Avapro 300 mg, metoprolol tartrate 50 mg Wean off Cleviprex  Diabetes type II uncontrolled Home meds: Levemir HgbA1c No results found for requested labs within last 26280 hours., goal < 7.0 CBGs Recent Labs    03/09/21 0353 03/09/21 0725 03/09/21 1219  GLUCAP 253* 248* 219*    SSI and levemir  Other Stroke Risk Factors Cigarette smoker, advised to stop smoking ETOH use, alcohol level No results found for requested labs within last 26280 hours., advised to drink no more than 2 drink(s) a day Obesity, There is no height or weight on file to calculate BMI., BMI >/= 30 associated with increased stroke risk, recommend weight loss, diet and exercise as appropriate  Coronary artery disease  Other Active Problems Shingles CCM and ID management valtrex 1g TID for 2-3 weeks Obstructive sleep apnea Home BiPAP at night and with naps  Hospital day # 1  Patient seen and examined by NP/APP with MD. MD to update note as needed.   Janine Ores, DNP, FNP-BC [facility] Pager: [phone] s   STROKE MD NOTE :  I have personally obtained history,examined this patient, reviewed notes, independently viewed imaging studies, participated in medical decision making and plan of care.ROS completed by me personally and pertinent positives fully documented  I have made any additions or clarifications directly to the above note. Agree with note above.  Patient presented with headache and left hemiplegia due to localized right frontal parenchymal hemorrhage with subarachnoid extension as well as underlying hemorrhagic infarct  etiology indeterminate but possibly from venous angioma which has bled.  May need repeat MRI brain with and without contrast in 3 months.  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  Recommend treat with Valtrex but no need for steroids at this time.  Continue close neurological monitoring and strict blood pressure control with systolic goal [id number] for the first 24 hours and then below 160.  Use oral blood pressure medications and as needed IV labetalol and hydralazine and wean off Cleviprex drip as tolerated.  Mobilize out of bed physical occupational and speech therapy consults.  No family available at the bedside for discussion.  Discussed with Dr. Verlee Monte critical care medicine. This patient is critically ill and at significant risk of neurological worsening, death and care requires constant monitoring of vital signs, hemodynamics,respiratory and cardiac monitoring, extensive review of multiple databases, frequent neurological assessment, discussion with family, other specialists and medical decision making of high complexity.I have  made any additions or clarifications directly to the above note.This critical care time does not reflect procedure time, or teaching time or supervisory time of PA/NP/Med Resident etc but could involve care discussion time.  I spent 30 minutes of neurocritical care time  in the care of  this patient.     Antony Contras, MD Medical Director [facility] Pager: [phone] 03/09/2021 4:02 PM  To contact Stroke Continuity provider, please refer to [email or web address] After hours, contact General Neurology

## 2021-03-10 ENCOUNTER — Inpatient Hospital Stay (HOSPITAL_COMMUNITY): Payer: BC Managed Care – PPO

## 2021-03-10 DIAGNOSIS — B028 Zoster with other complications: Secondary | ICD-10-CM | POA: Diagnosis present

## 2021-03-10 LAB — BASIC METABOLIC PANEL
Anion gap: 5 (ref 5–15)
BUN: 15 mg/dL (ref 8–23)
CO2: 24 mmol/L (ref 22–32)
Calcium: 8.2 mg/dL — ABNORMAL LOW (ref 8.9–10.3)
Chloride: 104 mmol/L (ref 98–111)
Creatinine, Ser: 1.01 mg/dL (ref 0.61–1.24)
GFR, Estimated: >60 mL/min (ref >60)
Glucose, Bld: 306 mg/dL — ABNORMAL HIGH (ref 70–99)
Potassium: 4.3 mmol/L (ref 3.5–5.1)
Sodium: 133 mmol/L — ABNORMAL LOW (ref 135–145)

## 2021-03-10 LAB — GLUCOSE, CAPILLARY
Glucose-Capillary: 330 mg/dL — ABNORMAL HIGH (ref 70–99)
Glucose-Capillary: 315 mg/dL — ABNORMAL HIGH (ref 70–99)
Glucose-Capillary: 178 mg/dL — ABNORMAL HIGH (ref 70–99)
Glucose-Capillary: 224 mg/dL — ABNORMAL HIGH (ref 70–99)
Glucose-Capillary: 417 mg/dL — ABNORMAL HIGH (ref 70–99)
Glucose-Capillary: 312 mg/dL — ABNORMAL HIGH (ref 70–99)

## 2021-03-10 LAB — LIPID PANEL
Cholesterol: 156 mg/dL (ref 0–200)
HDL: 36 mg/dL — ABNORMAL LOW (ref >40)
LDL Cholesterol: 106 mg/dL — ABNORMAL HIGH (ref 0–99)
Total CHOL/HDL Ratio: 4.3 RATIO
Triglycerides: 70 mg/dL (ref <150)
VLDL: 14 mg/dL (ref 0–40)

## 2021-03-10 LAB — HEMOGLOBIN A1C
Hgb A1c MFr Bld: 11.1 % — ABNORMAL HIGH (ref 4.8–5.6)
Mean Plasma Glucose: 271.87 mg/dL

## 2021-03-10 LAB — CBC
HCT: 40.1 % (ref 39.0–52.0)
Hemoglobin: 13.7 g/dL (ref 13.0–17.0)
MCH: 32.2 pg (ref 26.0–34.0)
MCHC: 34.2 g/dL (ref 30.0–36.0)
MCV: 94.4 fL (ref 80.0–100.0)
Platelets: 177 10*3/uL (ref 150–400)
RBC: 4.25 MIL/uL (ref 4.22–5.81)
RDW: 13.7 % (ref 11.5–15.5)
WBC: 6 10*3/uL (ref 4.0–10.5)
nRBC: 0 % (ref 0.0–0.2)

## 2021-03-10 LAB — T4, FREE: Free T4: 0.79 ng/dL (ref 0.61–1.12)

## 2021-03-10 LAB — TRIGLYCERIDES: Triglycerides: 72 mg/dL (ref <150)

## 2021-03-10 MED ORDER — INSULIN ASPART 100 UNIT/ML IJ SOLN
0.0000 [IU] | Freq: Three times a day (TID) | INTRAMUSCULAR | Status: DC
  Administered 2021-03-11 (×2): 8 [IU] via SUBCUTANEOUS
  Administered 2021-03-11: 17:00:00 5 [IU] via SUBCUTANEOUS
  Administered 2021-03-12: 14:00:00 8 [IU] via SUBCUTANEOUS
  Administered 2021-03-12: 18:00:00 5 [IU] via SUBCUTANEOUS
  Administered 2021-03-13: 13:00:00 10 [IU] via SUBCUTANEOUS
  Administered 2021-03-13: 09:00:00 3 [IU] via SUBCUTANEOUS
  Administered 2021-03-13 – 2021-03-14 (×2): 5 [IU] via SUBCUTANEOUS
  Administered 2021-03-14 (×2): 3 [IU] via SUBCUTANEOUS
  Administered 2021-03-14 – 2021-03-15 (×2): 5 [IU] via SUBCUTANEOUS
  Administered 2021-03-15: 12:00:00 8 [IU] via SUBCUTANEOUS
  Administered 2021-03-15: 18:00:00 15 [IU] via SUBCUTANEOUS
  Administered 2021-03-16 (×2): 5 [IU] via SUBCUTANEOUS
  Administered 2021-03-16 – 2021-03-17 (×2): 8 [IU] via SUBCUTANEOUS
  Administered 2021-03-17: 09:00:00 3 [IU] via SUBCUTANEOUS

## 2021-03-10 MED ORDER — DOCUSATE SODIUM 100 MG PO CAPS
100.0000 mg | ORAL_CAPSULE | Freq: Two times a day (BID) | ORAL | Status: DC
  Administered 2021-03-10 – 2021-03-15 (×10): 100 mg via ORAL
  Filled 2021-03-10 (×11): qty 1

## 2021-03-10 MED ORDER — GADOBUTROL 1 MMOL/ML IV SOLN
10.0000 mL | Freq: Once | INTRAVENOUS | Status: AC | PRN
  Administered 2021-03-10: 11:00:00 10 mL via INTRAVENOUS

## 2021-03-10 MED ORDER — LEVETIRACETAM 500 MG PO TABS
500.0000 mg | ORAL_TABLET | Freq: Two times a day (BID) | ORAL | Status: DC
  Administered 2021-03-10 – 2021-03-13 (×6): 500 mg via ORAL
  Filled 2021-03-10 (×6): qty 1

## 2021-03-10 MED ORDER — ENOXAPARIN SODIUM 40 MG/0.4ML IJ SOSY
40.0000 mg | PREFILLED_SYRINGE | INTRAMUSCULAR | Status: DC
  Administered 2021-03-10 – 2021-03-17 (×8): 40 mg via SUBCUTANEOUS
  Filled 2021-03-10 (×8): qty 0.4

## 2021-03-10 MED ORDER — CARVEDILOL 6.25 MG PO TABS
6.2500 mg | ORAL_TABLET | Freq: Two times a day (BID) | ORAL | Status: DC
  Administered 2021-03-11 – 2021-03-17 (×13): 6.25 mg via ORAL
  Filled 2021-03-10 (×13): qty 1

## 2021-03-10 MED ORDER — INSULIN ASPART 100 UNIT/ML IJ SOLN
8.0000 [IU] | Freq: Three times a day (TID) | INTRAMUSCULAR | Status: DC
  Administered 2021-03-10 – 2021-03-12 (×4): 8 [IU] via SUBCUTANEOUS

## 2021-03-10 MED ORDER — INSULIN DETEMIR 100 UNIT/ML ~~LOC~~ SOLN
40.0000 [IU] | Freq: Every day | SUBCUTANEOUS | Status: DC
  Administered 2021-03-10 – 2021-03-11 (×2): 40 [IU] via SUBCUTANEOUS
  Filled 2021-03-10 (×2): qty 0.4

## 2021-03-10 MED ORDER — INSULIN ASPART 100 UNIT/ML IJ SOLN
0.0000 [IU] | Freq: Every day | INTRAMUSCULAR | Status: DC
  Administered 2021-03-10: 21:00:00 5 [IU] via SUBCUTANEOUS
  Administered 2021-03-11 – 2021-03-16 (×3): 2 [IU] via SUBCUTANEOUS

## 2021-03-10 MED ORDER — INSULIN ASPART 100 UNIT/ML IJ SOLN: 0.0000 [IU] | Freq: Three times a day (TID) | INTRAMUSCULAR | Status: DC

## 2021-03-10 MED ORDER — DOXAZOSIN MESYLATE 8 MG PO TABS
8.0000 mg | ORAL_TABLET | Freq: Every day | ORAL | Status: DC
  Administered 2021-03-10 – 2021-03-17 (×8): 8 mg via ORAL
  Filled 2021-03-10 (×8): qty 1

## 2021-03-10 NOTE — Progress Notes (Signed)
Pt transferred to 4NP09 from 4NICU. Report received from [place], [place]. Pt A&Ox4, vitals WNL, assessment charted. Pt now eating dinner with call bell in reach.  Robina Ade, RN

## 2021-03-10 NOTE — Progress Notes (Incomplete)
03/10/2021 APP JD Suzie Portela and I saw and evaluated the patient. Discussed with them and agree with their findings and plan as documented in the their note.  I have seen and evaluated the patient for IPH, SAH  S:  Angio without clear evidence of source of bleeding. No change in exam, now off of cleviprex.   O: Blood pressure (!) 154/97, pulse 65, temperature 98.1 F (36.7 C), temperature source Axillary, resp. rate 20, weight 129.8 kg, SpO2 95 %.   Exam: Gen:       No acute distress HEENT:  tracking, sclera anicteric Neck:      No masses, JVD Lungs:    Clear to auscultation bilaterally; normal respiratory effort CV:         Regular rate and rhythm; no murmurs Abd:      + bowel sounds; soft, non-tender; no palpable masses, no distension Ext:    1+ edema; adequate peripheral perfusion Skin:       venous stasis change BLE Neuro:    alert and oriented x 3, weak LUE, LLE Psych:    normal mood and affect   Hyperglycemic  A:  # Right frontal IPH, subarachnoid extension # HTN # DM2 with hyperglycemia  P:  - DVT chemoprophylaxis - tighten up glycemic control - titrate up PO antihypertensives  - PT/OT   Laroy Apple Pulmonary/Critical Care

## 2021-03-10 NOTE — Progress Notes (Addendum)
Inpatient Diabetes Program Recommendations  AACE/ADA: New Consensus Statement on Inpatient Glycemic Control (2015)  Target Ranges:  Prepandial:   less than 140 mg/dL      Peak postprandial:   less than 180 mg/dL (1-2 hours)      Critically ill patients:  140 - 180 mg/dL   Lab Results  Component Value Date   GLUCAP 315 (H) 03/10/2021   HGBA1C 11.1 (H) 03/10/2021    Review of Glycemic Control  Latest Reference Range & Units 03/09/21 23:18 03/10/21 03:37 03/10/21 07:58  Glucose-Capillary 70 - 99 mg/dL 673 (H) 419 (H) 379 (H)  (H): Data is abnormally high Diabetes history: Type 2 DM Outpatient Diabetes medications: Levemir 40 units QD, Novolog 10 units TID, Trulicity 3 mg Qwk, Farxiga 10 mg QD Current orders for Inpatient glycemic control: Levemir 40 units QD, Novolog 0-15 units Q4H Sees Dr Katrinka Blazing- outpatient endocrinology  Inpatient Diabetes Program Recommendations:    Noted insulin changes. With diet order, consider changing correction to Novolog 0-15 units TID & HS and adding Novolog 8 units TID (Assuming patient is consuming >50% of meals).  Spoke with patient regarding outpatient diabetes management. Admits to frequently missing lunchtime and dinner meal coverage.  Reviewed patient's current A1c of 11.1%. Explained what a A1c is and what it measures. Also reviewed goal A1c with patient, importance of good glucose control @ home, and blood sugar goals. Reviewed patho of DM, need for insulin, importance of taking medications as prescribed, impact of blood sugars to CVA/ICH, vascular changes and commorbidities. Patient has a meter and supplies. Does not have issues obtaining medications.  Denies drinking sugary beverages. Reminded on importance of CHO mindfulness. Dietitian consult placed.  Encouraged to follow up with PCP for DM management. Has no further questions at this time.     Thanks, Lujean Rave, MSN, RNC-OB Diabetes Coordinator [phone] (8a-5p)

## 2021-03-10 NOTE — Evaluation (Signed)
Occupational Therapy Evaluation Patient Details Name: Joseph Ortega MRN: [id number] DOB: 1955-05-22 Today's Date: 03/10/2021   History of Present Illness Pt is a 65 y.o. male admitted 12/21 with dx of SAH. He presented to the ED via EMS from work with c/o L side weakness. 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 This hemorrhage has undergone subarachnoid extension and there is subarachnoid blood in the sulci of the right sylvian fissure and posterior frontal region.  Pt also found to have shingles L anterior chest. PMH: DM2, HTN   Clinical Impression   Patient admitted for the diagnosis above.  PTA he worked full time, lives with his spouse, who is disabled, and he is her primary caregiver.  He was independent with all mobility, ADL/IADL and works/drives.  Deficits impacting independence are listed below.  Currently he is needed +2 for basic mobility, and up to total assist for lower body ADL.  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  Mild complex thought and problem solving deficits noted.  OT to follow in the acute setting, and AIR is recommended for post acute rehab prior to returning home.  Patient's wife is wheelchair level, but is able to self propel her chair, and care for basic needs at wheelchair level.       Recommendations for follow up therapy are one component of a multi-disciplinary discharge planning process, led by the attending physician.  Recommendations may be updated based on patient status, additional functional criteria and insurance authorization.   Follow Up Recommendations  Acute inpatient rehab (3hours/day)    Assistance Recommended at Discharge Frequent or constant Supervision/Assistance  Functional Status Assessment  Patient has had a recent decline in their functional status and demonstrates the ability to make significant improvements in function in a reasonable and  predictable amount of time.  Equipment Recommendations  Wheelchair (measurements OT);Wheelchair cushion (measurements OT)    Recommendations for Other Services Rehab consult     Precautions / Restrictions Precautions Precautions: Fall;Other (comment) Precaution Comments: L weakness Restrictions Other Position/Activity Restrictions: Baseline L visual field impairment      Mobility Bed Mobility Overal bed mobility: Needs Assistance Bed Mobility: Rolling Rolling: Mod assist   Supine to sit: Max assist;HOB elevated Sit to supine: Max assist;HOB elevated   General bed mobility comments: assist with LLE and tunk, increased time, cues for sequencing    Transfers                   General transfer comment: unable to clear bottom from bed during standing trials x 3 (with +1 assist)      Balance Overall balance assessment: Needs assistance Sitting-balance support: Feet supported;Single extremity supported Sitting balance-Leahy Scale: Fair                                     ADL either performed or assessed with clinical judgement   ADL Overall ADL's : Needs assistance/impaired Eating/Feeding: Set up;Bed level   Grooming: Wash/dry hands;Wash/dry face;Set up;Bed level   Upper Body Bathing: Moderate assistance;Bed level   Lower Body Bathing: Maximal assistance;Bed level   Upper Body Dressing : Maximal assistance;Bed level   Lower Body Dressing: Total assistance;Bed level                       Vision Baseline Vision/History: 1 Wears glasses  Patient Visual Report: Peripheral vision impairment Vision Assessment?: No apparent visual deficits     Perception Perception Perception: Impaired Inattention/Neglect: Does not attend to left visual field;Other (comment)   Praxis Praxis Praxis: Intact    Pertinent Vitals/Pain Pain Assessment: No/denies pain Pain Score: 6  Pain Location: L chest wall (shingles) Pain Descriptors / Indicators:  Constant;Discomfort Pain Intervention(s): Monitored during session     Hand Dominance Right   Extremity/Trunk Assessment Upper Extremity Assessment Upper Extremity Assessment: LUE deficits/detail LUE Deficits / Details: 0/5 hand/elbow, 1/5 shoulder, edema noted at hand LUE Sensation: decreased light touch LUE Coordination: decreased fine motor;decreased gross motor   Lower Extremity Assessment Lower Extremity Assessment: Defer to PT evaluation LLE Deficits / Details: 0/5 DF, 2/5 knee and hip LLE Sensation: decreased light touch;decreased proprioception   Cervical / Trunk Assessment Cervical / Trunk Assessment: Other exceptions Cervical / Trunk Exceptions: body habitus   Communication Communication Communication: No difficulties   Cognition Arousal/Alertness: Awake/alert Behavior During Therapy: WFL for tasks assessed/performed Overall Cognitive Status: Impaired/Different from baseline Area of Impairment: Problem solving                             Problem Solving: Slow processing General Comments: Difficulty with complex thought and problem solving.     General Comments  BP 137/60 at rest, 160/57 sitting EOB    Exercises     Shoulder Instructions      Home Living Family/patient expects to be discharged to:: Private residence Living Arrangements: Spouse/significant other Available Help at Discharge: Family Type of Home: House Home Access: Ramped entrance     Home Layout: One level     Bathroom Shower/Tub: Producer, television/film/video: Handicapped height     Home Equipment: Shower seat - built in;Grab bars - tub/shower   Additional Comments: Pt's wife is in a wheelchair.      Prior Functioning/Environment Prior Level of Function : Independent/Modified Independent;Working/employed;Driving                        OT Problem List: Decreased strength;Decreased range of motion;Decreased activity tolerance;Impaired  vision/perception;Impaired balance (sitting and/or standing);Decreased cognition;Obesity      OT Treatment/Interventions: Self-care/ADL training;Therapeutic exercise;Neuromuscular education;DME and/or AE instruction;Cognitive remediation/compensation;Therapeutic activities;Visual/perceptual remediation/compensation;Balance training;Patient/family education    OT Goals(Current goals can be found in the care plan section) Acute Rehab OT Goals Patient Stated Goal: Return to work OT Goal Formulation: With patient Time For Goal Achievement: 03/24/21 Potential to Achieve Goals: Good ADL Goals Pt Will Perform Grooming: with supervision;sitting Pt Will Perform Upper Body Bathing: with min assist;sitting Pt Will Perform Upper Body Dressing: with min assist;sitting Pt Will Transfer to Toilet: with mod assist;squat pivot transfer;bedside commode Pt Will Perform Toileting - Clothing Manipulation and hygiene: with mod assist;sit to/from stand Pt/caregiver will Perform Home Exercise Program: Increased ROM;Increased strength;Left upper extremity;With Supervision  OT Frequency: Min 2X/week   Barriers to D/C:    Undetermined       Co-evaluation              AM-PAC OT "6 Clicks" Daily Activity     Outcome Measure Help from another person eating meals?: A Little Help from another person taking care of personal grooming?: A Little Help from another person toileting, which includes using toliet, bedpan, or urinal?: A Lot Help from another person bathing (including washing, rinsing, drying)?: A Lot Help from another person to put on and  taking off regular upper body clothing?: A Lot Help from another person to put on and taking off regular lower body clothing?: Total 6 Click Score: 13   End of Session    Activity Tolerance: Patient tolerated treatment well Patient left: in bed;with call bell/phone within reach  OT Visit Diagnosis: Unsteadiness on feet (R26.81);Muscle weakness (generalized)  (M62.81);Hemiplegia and hemiparesis Hemiplegia - Right/Left: Left Hemiplegia - dominant/non-dominant: Non-Dominant Hemiplegia - caused by: Cerebral infarction;Nontraumatic SAH                Time: 6195-0932 OT Time Calculation (min): 22 min Charges:  OT General Charges $OT Visit: 1 Visit OT Evaluation $OT Eval Moderate Complexity: 1 Mod  03/10/2021  RP, OTR/L  Acute Rehabilitation Services  Office:  [phone]   Suzanna Obey 03/10/2021, 12:49 PM

## 2021-03-10 NOTE — Progress Notes (Signed)
Inpatient Rehab Admissions Coordinator:   Per PT eval pt was screened for CIR by Estill Dooms, PT, DPT.  Note limited eval and appears limited caregiver support at discharge (wife disabled and using w/c).  [employer] will not be open today or Monday.  They will not approve SNF stay following CIR.  In order to be considered a candidate for CIR he will need to be mobilizing out of bed and have a caregiver able to complete physical assist identified.  We will rescreen on Monday.   Estill Dooms, PT, DPT Admissions Coordinator [phone] 03/10/21  12:18 PM

## 2021-03-10 NOTE — Progress Notes (Addendum)
STROKE TEAM PROGRESS NOTE   INTERVAL HISTORY Patient seen in room with no family at the bedside.  He has been hemodynamically stable and his neurological exam remains stable.  He denies headache.  Follow up MRI today reveals new right occipital lobe infarction and enlargement of posterior frontal infarction.  Cleviprex has been stopped. Transfer to floor when bed available. Vitals:   03/10/21 0900 03/10/21 0915 03/10/21 0930 03/10/21 0945  BP: (!) 154/97  137/60   Pulse: 65 66 64 63  Resp: 20 20 19  (!) 23  Temp:      TempSrc:      SpO2: 95% 95% 96% 95%  Weight:       CBC:  Recent Labs  Lab 03/08/21 0900 03/09/21 0130 03/10/21 0326  WBC 7.3 5.5 6.0  NEUTROABS 4.7  --   --   HGB 14.3 15.0 13.7  HCT 41.0 43.9 40.1  MCV 93.4 93.0 94.4  PLT 221 203 [id number]    Basic Metabolic Panel:  Recent Labs  Lab 03/08/21 1528 03/09/21 0130 03/10/21 0326  NA  --  135 133*  K  --  3.6 4.3  CL  --  103 104  CO2  --  23 24  GLUCOSE  --  246* 306*  BUN  --  16 15  CREATININE  --  0.83 1.01  CALCIUM  --  8.7* 8.2*  MG 2.2  --   --     Lipid Panel:  Recent Labs  Lab 03/10/21 0326  CHOL 156  TRIG 70   72  HDL 36*  CHOLHDL 4.3  VLDL 14  LDLCALC 106*   HgbA1c:  Recent Labs  Lab 03/10/21 0326  HGBA1C 11.1*   Urine Drug Screen:  Recent Labs  Lab 03/08/21 1003  LABOPIA NONE DETECTED  COCAINSCRNUR NONE DETECTED  LABBENZ NONE DETECTED  AMPHETMU NONE DETECTED  THCU NONE DETECTED  LABBARB NONE DETECTED     Alcohol Level No results for input(s): ETH in the last 168 hours.  IMAGING past 24 hours CT HEAD WO CONTRAST (5MM)  Addendum Date: 03/10/2021   ADDENDUM REPORT: 03/10/2021 06:00 ADDENDUM: Study discussed by telephone with Neurology Dr. Lorrin Goodell at 0550 hours. Electronically Signed   By: Genevie Ann M.D.   On: 03/10/2021 06:00   Result Date: 03/10/2021 CLINICAL DATA:  65 year old male code stroke presentation with right hemisphere subarachnoid hemorrhage, unrevealing CTA.  MRI revealing small right frontal lobe infarct. Conventional cerebral angiogram negative for aneurysm or vascular lesion. EXAM: CT HEAD WITHOUT CONTRAST TECHNIQUE: Contiguous axial images were obtained from the base of the skull through the vertex without intravenous contrast. COMPARISON:  None. FINDINGS: Brain: Small volume right hemisphere subarachnoid hemorrhage, primarily within the central sulcus and right sylvian fissure has regressed since 03/08/2021 with small volume residual. Patchy roughly 2.5 cm hypodense area of cerebral edema in the right middle frontal gyrus corresponding to restricted diffusion on MRI (series 2, image 23) with adjacent conspicuous 5 mm rounded area of hemorrhage (series 2, image 24). Furthermore, increasing hypodensity in the superior right occipital lobe at the right MCA/PCA watershed area (series 5, image 26 and series 2, image 17. But elsewhere gray-white matter differentiation is stable and within normal limits. No intracranial mass effect. No intraventricular hemorrhage or ventriculomegaly. Normal basilar cisterns. Vascular: Mild Calcified atherosclerosis at the skull base. Skull: No acute osseous abnormality identified. Sinuses/Orbits: Visualized paranasal sinuses and mastoids are stable and well aerated. Other: No acute orbit or scalp soft tissue finding. IMPRESSION: 1.  New cerebral edema in the Supe, rior Right Occipital Lobe at the right MCA/PCA watershed. This is superimposed on the small posterior right frontal lobe infarct. 2. Small volume acute right hemisphere hemorrhage has partially regressed since 03/08/2021. No new or increased blood. No intracranial mass effect. Electronically Signed: By: Genevie Ann M.D. On: 03/10/2021 05:52    PHYSICAL EXAM General: Alert, well-developed male in no acute distress.  NEURO:  Mental Status: AA&Ox3  Speech/Language: speech is without dysarthria or aphasia.  Naming, repetition, fluency, and comprehension intact.  Cranial Nerves:   II: PERRL. Visual fields full.  III, IV, VI: EOMI. Eyelids elevate symmetrically.  V: Sensation is intact to light touch and symmetrical to face.  VII: Smile is symmetrical.  VIII: hearing intact to voice. IX, X:Phonation is normal.  XII: tongue is midline without fasciculations. Motor: 5/5 strength to RUE and RLE, 0/5 to LUE and 2/5 to LLE Tone: is normal and bulk is normal Sensation- Intact to light touch bilaterally. Extinction absent to light touch to DSS.    Coordination: No drift.  Gait- deferred   ASSESSMENT/PLAN [name] is a 65 y.o. male with history of Type 2 diabetes, hypertension, hypothyroidism. Patient states on 12/21 at 2 AM he woke up and felt normal.  At 7:30 AM patient noticed sudden onset left-sided weakness.  Symptoms gradually improved.  Still having weakness in left arm.  Denies speech concerns.  Patient also has rash under left chest  appears to be shingles.  CT head shows 4 mm intraparenchymal hematoma in right posterior frontal lobe with subarachnoid penetration.  Left sided deficits and mental status stable since initial evaluation. Neurosurgery consulted.  SBP goal <160.  Cleviprex has been tapered off and patient can be transferred out of the ICU.  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  Due to increased likelihood of embolic etiology of strokes, patient will need loop recorder before discharge.  Right frontal localized intraparenchymal hemorrhage with subarachnoid hemorrhage and underlying hemorrhagic infarct etiology indeterminate possibly secondary to venous angioma which has bled.  Doubt zoster vasculopathy Code Stroke SAH with 41mm IPH CTA head & neck 1-32mm infundibulum vs aneurysm arising from paraclinoid L ICA 12/22- Cerebral angio -No angio evidence of of DAVF,AV shunting,AVM ,aneurysms or of dissections or vasculitis MRI localized acute infarction at the right posterior  frontal vertex with a 6 mm associated IPH.  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  Therapy recommendations: CIR Disposition: Pending Keppra 500mg  q 12hr  Hypertension Home meds: Valsartan 320mg , metoprolol tartrate 50 mg Stable 130-150 for 24 hours then relax goal to 160 Long-term BP goal normotensive Start Avapro 300 mg, metoprolol tartrate 50 mg  Diabetes type II uncontrolled Home meds: Levemir HgbA1c 11.1, goal < 7.0 CBGs Recent Labs    03/09/21 2318 03/10/21 0337 03/10/21 0758  GLUCAP 333* 330* 315*    Diabetes coordinator consult SSI and levemir  Other Stroke Risk Factors Cigarette smoker, advised to stop smoking ETOH use, alcohol level No results found for requested labs within last 26280 hours., advised to drink no more than 2 drink(s) a day Obesity, Body mass index is 37.75 kg/m., BMI >/= 30 associated with increased stroke risk,  recommend weight loss, diet and exercise as appropriate  Coronary artery disease  Other Active Problems Shingles CCM and ID management valtrex 1g TID for 2-3 weeks Obstructive sleep apnea Home BiPAP at night and with naps  Hospital day # 2  Patient seen and examined by NP/APP with MD. MD to update note as needed.   Cortney E Ernestina Columbia , MSN, AGACNP-BC [facility] See Amion for schedule and pager information 03/10/2021 10:31 AM  ATTENDING ATTESTATION:  Repeat MRI shows worsening stroke and new CVA in occipital concnering for possible cardiac etiology, complicated by ICH.  Angiogram is negative.  Cliviprex stopped as BP is stable. Will transfer to floor bed. Spoke with pt's wife over the phone to update her but prior  to MRI results known. Con't to monitor for afib.Discussed with pharmacy, will start half dose lovenox for DVT prophylaxis and increase dose possibly tomorrow.   Dr. Viviann Spare evaluated pt independently, reviewed imaging, chart, labs. Discussed and formulated plan with the APP. Please see APP note above for details.      This patient is critically ill due to respiratory distress, stroke  and ICH and at significant risk of neurological worsening, death form heart failure, respiratory failure, recurrent stroke, bleeding from [facility], seizure, sepsis. This patient's care requires constant monitoring of vital signs, hemodynamics, respiratory and cardiac monitoring, review of multiple databases, neurological assessment, discussion with family, other specialists and medical decision making of high complexity. I spent 35 minutes of neurocritical care time in the care of this patient.   Suriyah Vergara,MD     To contact Stroke Continuity provider, please refer to [email or web address]. After hours, contact General Neurology

## 2021-03-10 NOTE — Evaluation (Signed)
Physical Therapy Evaluation Patient Details Name: Joseph Ortega MRN: [id number] DOB: 1956/02/01 Today's Date: 03/10/2021  History of Present Illness  Pt is a 65 y.o. male admitted 12/21 with dx of SAH. He presented to the ED via EMS from work with c/o L side weakness. 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 This hemorrhage has undergone subarachnoid extension and there is subarachnoid blood in the sulci of the right sylvian fissure and posterior frontal region.  Pt also found to have shingles L anterior chest. PMH: DM2, HTN   Clinical Impression  Pt admitted with above diagnosis. PTA pt lived at home with his wife, who has disabilities and is w/c dependent. 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 On eval, he required max assist bed mobility and demonstrated fair sitting balance EOB. Unsuccessful standing trials x 3 with +1 assist. Pt presents with L hemiplegia and mild L inattention. Pt currently with functional limitations due to the deficits listed below (see PT Problem List). Pt will benefit from skilled PT to increase their independence and safety with mobility to allow discharge to the venue listed below.          Recommendations for follow up therapy are one component of a multi-disciplinary discharge planning process, led by the attending physician.  Recommendations may be updated based on patient status, additional functional criteria and insurance authorization.  Follow Up Recommendations Acute inpatient rehab (3hours/day)    Assistance Recommended at Discharge Frequent or constant Supervision/Assistance  Functional Status Assessment Patient has had a recent decline in their functional status and demonstrates the ability to make significant improvements in function in a reasonable and predictable amount of time.  Equipment Recommendations  Other (comment) (TBD)     Recommendations for Other Services Rehab consult     Precautions / Restrictions Precautions Precautions: Fall;Other (comment) Precaution Comments: L weakness Restrictions Weight Bearing Restrictions: No      Mobility  Bed Mobility Overal bed mobility: Needs Assistance Bed Mobility: Supine to Sit;Sit to Supine     Supine to sit: Max assist;HOB elevated Sit to supine: Max assist;HOB elevated   General bed mobility comments: assist with LLE and tunk, increased time, cues for sequencing    Transfers                   General transfer comment: unable to clear bottom from bed during standing trials x 3 (with +1 assist)    Ambulation/Gait               General Gait Details: unable  Stairs            Wheelchair Mobility    Modified Rankin (Stroke Patients Only) Modified Rankin (Stroke Patients Only) Pre-Morbid Rankin Score: No symptoms Modified Rankin: Severe disability     Balance Overall balance assessment: Needs assistance Sitting-balance support: Feet supported;Single extremity supported Sitting balance-Leahy Scale: Fair                                       Pertinent Vitals/Pain Pain Assessment: 0-10 Pain Score: 6  Pain Location: L chest wall (shingles) Pain Descriptors / Indicators: Constant;Discomfort Pain Intervention(s): Monitored during session;Repositioned    Home Living Family/patient expects to be discharged to:: Private residence Living Arrangements: Spouse/significant other Available Help at Discharge: Family Type of Home: House Home Access: Ramped  entrance       Home Layout: One level Home Equipment: Shower seat - built in;Grab bars - tub/shower Additional Comments: Pt's wife is in a wheelchair.    Prior Function Prior Level of Function : Independent/Modified Independent;Working/employed;Driving                     Hand Dominance        Extremity/Trunk Assessment   Upper Extremity  Assessment Upper Extremity Assessment: LUE deficits/detail LUE Deficits / Details: 0/5 hand/elbow, 1/5 shoulder, edema noted at hand LUE Sensation: decreased light touch;decreased proprioception    Lower Extremity Assessment Lower Extremity Assessment: LLE deficits/detail LLE Deficits / Details: 0/5 DF, 2/5 knee and hip LLE Sensation: decreased light touch;decreased proprioception    Cervical / Trunk Assessment Cervical / Trunk Assessment: Normal  Communication   Communication: No difficulties  Cognition Arousal/Alertness: Awake/alert Behavior During Therapy: WFL for tasks assessed/performed Overall Cognitive Status: Within Functional Limits for tasks assessed                                          General Comments General comments (skin integrity, edema, etc.): BP 137/60 at rest, 160/57 sitting EOB    Exercises     Assessment/Plan    PT Assessment Patient needs continued PT services  PT Problem List Decreased strength;Decreased mobility;Decreased activity tolerance;Pain;Decreased balance;Decreased knowledge of use of DME;Impaired sensation       PT Treatment Interventions DME instruction;Therapeutic activities;Therapeutic exercise;Gait training;Patient/family education;Balance training;Functional mobility training;Neuromuscular re-education    PT Goals (Current goals can be found in the Care Plan section)  Acute Rehab PT Goals Patient Stated Goal: regain independence PT Goal Formulation: With patient Time For Goal Achievement: 03/24/21 Potential to Achieve Goals: Good    Frequency Min 4X/week   Barriers to discharge        Co-evaluation               AM-PAC PT "6 Clicks" Mobility  Outcome Measure Help needed turning from your back to your side while in a flat bed without using bedrails?: A Lot Help needed moving from lying on your back to sitting on the side of a flat bed without using bedrails?: A Lot Help needed moving to and from a  bed to a chair (including a wheelchair)?: Total Help needed standing up from a chair using your arms (e.g., wheelchair or bedside chair)?: Total Help needed to walk in hospital room?: Total Help needed climbing 3-5 steps with a railing? : Total 6 Click Score: 8    End of Session Equipment Utilized During Treatment: Gait belt Activity Tolerance: Patient tolerated treatment well Patient left: in bed;with call bell/phone within reach Nurse Communication: Mobility status;Need for lift equipment PT Visit Diagnosis: Other abnormalities of gait and mobility (R26.89);Hemiplegia and hemiparesis Hemiplegia - Right/Left: Left Hemiplegia - dominant/non-dominant: Non-dominant Hemiplegia - caused by: Nontraumatic SAH    Time: 8416-6063 PT Time Calculation (min) (ACUTE ONLY): 29 min   Charges:   PT Evaluation $PT Eval Moderate Complexity: 1 Mod PT Treatments $Therapeutic Activity: 8-22 mins        Aida Raider, PT  Office # [phone] Pager [phone]   Ilda Foil 03/10/2021, 9:51 AM

## 2021-03-10 NOTE — Progress Notes (Signed)
NAME:  Joseph Ortega, MRN:  [id number], DOB:  08/18/1955, LOS: 2 ADMISSION DATE:  03/08/2021, CONSULTATION DATE:  12/21 REFERRING MD:  Dr. Fuller Plan , CHIEF COMPLAINT:  SAH   History of Present Illness:  Patient is a 65 year old male with pertinent PMH of DMT2, HTN presents to [facility] ED on 12/21 with weakness.  Patient states on 12/21 at 2 AM he woke up and felt normal.  At 7:30 AM patient noticed sudden onset left-sided weakness.  Symptoms gradually improved.  Still having weakness in left arm.  Denies speech concerns.  Patient also has rash under left chest but appears to be shingles.  Code stroke called.  CT head shows 4 mm intraparenchymal hematoma in right posterior frontal lobe with subarachnoid penetration.  Left sided deficits and mental status stable since initial evaluation. Neurosurgery consulted.  Keppra and Cleviprex started SBP goal less than 160.  PCCM asked to admit to [facility] from [facility].  Pertinent  Medical History   Past Medical History:  Diagnosis Date   Diabetes mellitus without complication (HCC)    Hypertension    Thyroid disease     Significant Hospital Events: Including procedures, antibiotic start and stop dates in addition to other pertinent events   12/21: transferring from [facility] to [facility]; on cleviprex  Interim History / Subjective:  Glucose remains 300-400 range Off cleviprex; on home bp meds Patient on room air; no complaints Aox3; MAE  Objective   Blood pressure (!) 164/89, pulse 63, temperature 98.1 F (36.7 C), temperature source Axillary, resp. rate 17, weight 129.8 kg, SpO2 96 %.    FiO2 (%):  [30 %] 30 %   Intake/Output Summary (Last 24 hours) at 03/10/2021 0841 Last data filed at 03/10/2021 0600 Gross per 24 hour  Intake 612.82 ml  Output 1200 ml  Net -587.18 ml    Filed Weights   03/10/21 0500  Weight: 129.8 kg    Examination: General:   NAD HEENT: MM pink/moist Neuro: Aox3; MAE CV: s1s2, RRR, no m/r/g PULM:  dim clear BS  bilaterally; RA GI: soft, bsx4 active  Extremities: warm/dry, no edema   CT Head 12/23:  1. New cerebral edema in the Superior Right Occipital Lobe at the right MCA/PCA watershed. This is superimposed on the small posterior right frontal lobe infarct. 2. Small volume acute right hemisphere hemorrhage has partially regressed since 03/08/2021. No new or increased blood. No intracranial mass effect.  Resolved Hospital Problem list     Assessment & Plan:  R hemispheric SAH, IPH:  Hx of HTN CT head 12/21-4 mm intraparenchymal hematoma in right posterior frontal lobe with subarachnoid penetration. Unclear etiology of SAH - there may be bland cortical vein thrombus leading to infarct but alternative consideration would be VZV vasculopathy given active shingles. P: -Neuro following; appreciate recs -off cleviprex; SBP goal <160 per neuro -on home BP meds -continue keppra -frequent neuro checks -glucose remains elevated; increasing levemir; continue SSI and cbg monitoring -avoid fevers; prn tylenol -continue valtrex 1g tid 2-3 weeks -PT/OT  L Shingles: Left anterior chest around T10 dermatome but present for 3 days P: -continue valtrex 1g TID for 2-3 weeks in case represents VZV vasculopathy manifestation  DMT2 P: -SSI increased to moderate overnight -glucose remains elevated -increasing levemir -cbg monitoring  Hx of hypothyroidism P: -tsh elevated; will check t4 -continue home synthroid dose  OSA:  - continue home BiPAP at night and naps  Hx of depression? P: -will hold mirtazapine, trazodone, and duloxetine  Patient  is off cleviprex drip; we will transfer him to progressive; PCCM available prn  Best Practice (right click and "Reselect all SmartList Selections" daily)   Diet/type: Regular consistency (see orders) DVT prophylaxis: LMWH GI prophylaxis: N/A Lines: N/A Foley:  N/A Code Status:  full code Last date of multidisciplinary goals of care discussion  [updated patient at bedside 12/23]       JD Daryel November [facility]monary [facility] 03/10/2021, 9:39 AM  Please see [email or web address] for pager details.  From 7A-7P if no response, please call [phone]. After hours, please call ELink [phone].

## 2021-03-11 ENCOUNTER — Inpatient Hospital Stay (HOSPITAL_COMMUNITY): Payer: BC Managed Care – PPO

## 2021-03-11 DIAGNOSIS — I6389 Other cerebral infarction: Secondary | ICD-10-CM

## 2021-03-11 DIAGNOSIS — I10 Essential (primary) hypertension: Secondary | ICD-10-CM | POA: Diagnosis present

## 2021-03-11 DIAGNOSIS — Z720 Tobacco use: Secondary | ICD-10-CM

## 2021-03-11 LAB — ECHOCARDIOGRAM COMPLETE BUBBLE STUDY
AR max vel: 2.39 cm2
AV Area VTI: 2.66 cm2
AV Area mean vel: 2.51 cm2
AV Mean grad: 3 mmHg
AV Peak grad: 7.1 mmHg
Ao pk vel: 1.33 m/s
Area-P 1/2: 3.51 cm2
Calc EF: 61.7 %
S' Lateral: 2.37 cm
Single Plane A2C EF: 53.5 %
Single Plane A4C EF: 64.4 %

## 2021-03-11 LAB — CBC
HCT: 40.6 % (ref 39.0–52.0)
Hemoglobin: 13.4 g/dL (ref 13.0–17.0)
MCH: 31.5 pg (ref 26.0–34.0)
MCHC: 33 g/dL (ref 30.0–36.0)
MCV: 95.3 fL (ref 80.0–100.0)
Platelets: 151 10*3/uL (ref 150–400)
RBC: 4.26 MIL/uL (ref 4.22–5.81)
RDW: 13.8 % (ref 11.5–15.5)
WBC: 5.5 10*3/uL (ref 4.0–10.5)
nRBC: 0 % (ref 0.0–0.2)

## 2021-03-11 LAB — BASIC METABOLIC PANEL
Anion gap: 9 (ref 5–15)
BUN: 19 mg/dL (ref 8–23)
CO2: 23 mmol/L (ref 22–32)
Calcium: 8.6 mg/dL — ABNORMAL LOW (ref 8.9–10.3)
Chloride: 105 mmol/L (ref 98–111)
Creatinine, Ser: 0.92 mg/dL (ref 0.61–1.24)
GFR, Estimated: >60 mL/min (ref >60)
Glucose, Bld: 274 mg/dL — ABNORMAL HIGH (ref 70–99)
Potassium: 4.1 mmol/L (ref 3.5–5.1)
Sodium: 137 mmol/L (ref 135–145)

## 2021-03-11 LAB — GLUCOSE, CAPILLARY
Glucose-Capillary: 265 mg/dL — ABNORMAL HIGH (ref 70–99)
Glucose-Capillary: 268 mg/dL — ABNORMAL HIGH (ref 70–99)
Glucose-Capillary: 260 mg/dL — ABNORMAL HIGH (ref 70–99)
Glucose-Capillary: 220 mg/dL — ABNORMAL HIGH (ref 70–99)
Glucose-Capillary: 216 mg/dL — ABNORMAL HIGH (ref 70–99)
Glucose-Capillary: 200 mg/dL — ABNORMAL HIGH (ref 70–99)

## 2021-03-11 MED ORDER — INSULIN DETEMIR 100 UNIT/ML ~~LOC~~ SOLN
25.0000 [IU] | Freq: Two times a day (BID) | SUBCUTANEOUS | Status: DC
Start: 2021-03-12 — End: 2021-03-12
  Administered 2021-03-12: 11:00:00 25 [IU] via SUBCUTANEOUS
  Filled 2021-03-11 (×2): qty 0.25

## 2021-03-11 MED ORDER — IOHEXOL 350 MG/ML SOLN
74.0000 mL | Freq: Once | INTRAVENOUS | Status: AC | PRN
  Administered 2021-03-11: 19:00:00 74 mL via INTRAVENOUS

## 2021-03-11 MED ORDER — ASPIRIN 81 MG PO CHEW: 81.0000 mg | CHEWABLE_TABLET | Freq: Every day | ORAL | Status: DC

## 2021-03-11 MED ORDER — PERFLUTREN LIPID MICROSPHERE
1.0000 mL | INTRAVENOUS | Status: AC | PRN
Start: 2021-03-11 — End: 2021-03-11
  Administered 2021-03-11: 13:00:00 2 mL via INTRAVENOUS
  Filled 2021-03-11: qty 10

## 2021-03-11 MED ORDER — INSULIN DETEMIR 100 UNIT/ML ~~LOC~~ SOLN
10.0000 [IU] | Freq: Once | SUBCUTANEOUS | Status: AC
  Administered 2021-03-11: 22:00:00 10 [IU] via SUBCUTANEOUS
  Filled 2021-03-11 (×2): qty 0.1

## 2021-03-11 NOTE — Evaluation (Signed)
Speech Language Pathology Evaluation Patient Details Name: Joseph Ortega MRN: [id number] DOB: 03-Aug-1955 Today's Date: 03/11/2021 Time: 4098-1191 SLP Time Calculation (min) (ACUTE ONLY): 19 min  Problem List:  Patient Active Problem List   Diagnosis Date Noted   Herpes zoster with complication    SAH (subarachnoid hemorrhage) (HCC) 03/08/2021   Past Medical History:  Past Medical History:  Diagnosis Date   Diabetes mellitus without complication (HCC)    Hypertension    Thyroid disease    Past Surgical History:  Past Surgical History:  Procedure Laterality Date   HERNIA REPAIR     IR ANGIO INTRA EXTRACRAN SEL COM CAROTID INNOMINATE BILAT MOD SED  03/09/2021   IR ANGIO VERTEBRAL SEL VERTEBRAL BILAT MOD SED  03/09/2021   IR US GUIDE VASC ACCESS RIGHT  03/09/2021   HPI:  Pt is a 65 y.o. male admitted 12/21 with dx of SAH. He presented to the ED via EMS from work with c/o L side weakness. 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 This hemorrhage has undergone subarachnoid extension and there is subarachnoid blood in the sulci of the right sylvian fissure and posterior frontal region.  Pt also found to have shingles L anterior chest. PMH: DM2, HTN   Assessment / Plan / Recommendation Clinical Impression  Pt demonstrates mild dysarthria, he is fully intelligible and it able to sharpen articulation with effort. Pt has mild cognitive impiarment at baseline per wife in areas such as memory. Today he had a few errors on SLUMS, in memory task and in mathematical reasoning. Otherwise, pt very appropriate and successful. Recommned no SLP f/u as deficits are mild and close to baseline with appropriate assist from wife for finances etc available. WIll sign off.    SLP Assessment  SLP Recommendation/Assessment: Patient does not need any further Speech Lanaguage Pathology Services    Recommendations for follow  up therapy are one component of a multi-disciplinary discharge planning process, led by the attending physician.  Recommendations may be updated based on patient status, additional functional criteria and insurance authorization.    Follow Up Recommendations       Assistance Recommended at Discharge     Functional Status Assessment    Frequency and Duration           SLP Evaluation Cognition  Overall Cognitive Status: History of cognitive impairments - at baseline Arousal/Alertness: Awake/alert Orientation Level: Oriented to person;Oriented to place;Disoriented to time;Oriented to situation Attention: Divided Divided Attention: Appears intact Memory: Impaired Memory Impairment: Retrieval deficit Awareness: Appears intact Problem Solving: Appears intact       Comprehension  Auditory Comprehension Overall Auditory Comprehension: Appears within functional limits for tasks assessed    Expression Verbal Expression Overall Verbal Expression: Appears within functional limits for tasks assessed   Oral / Motor  Oral Motor/Sensory Function Overall Oral Motor/Sensory Function: Mild impairment Facial ROM: Within Functional Limits Facial Symmetry: Within Functional Limits Facial Strength: Within Functional Limits Facial Sensation: Within Functional Limits Lingual ROM: Reduced left;Suspected CN XII (hypoglossal) dysfunction Lingual Symmetry: Abnormal symmetry left;Suspected CN XII (hypoglossal) dysfunction Lingual Strength: Within Functional Limits Lingual Sensation: Within Functional Limits Motor Speech Overall Motor Speech: Impaired Respiration: Within functional limits Phonation: Normal Resonance: Within functional limits Articulation: Impaired Level of Impairment: Conversation Intelligibility: Intelligible Motor Planning: Witnin functional limits Motor Speech Errors: Aware            Joseph Ortega, Joseph Ortega 03/11/2021, 9:12 AM

## 2021-03-11 NOTE — Assessment & Plan Note (Signed)
Meets criteria BMI greater than 35+ comorbidities of diabetes and hypertension and CVA

## 2021-03-11 NOTE — Assessment & Plan Note (Addendum)
On valsartan and metoprolol.  Allowing for some permissive hypertension although at times, blood pressure in normal range.

## 2021-03-11 NOTE — Assessment & Plan Note (Addendum)
Appreciate neurology help.  Patient with right frontal localized intraparenchymal hemorrhage and underlying hemorrhagic infarct with etiology indeterminant possibly secondary to venous angioma.  Repeat MRI on 12/23 notes new acute infarction of right occipital lobe and enlargement of posterior frontal infarction.  On Keppra 500 twice a day.  Looking at skilled nursing.  LDL at 106.  A1c 11.1.  Not on any antithrombotics currently.  Seen by neurology and follow-up with signed off.  Repeat CVA felt to be possibly atherosclerosis versus embolic.  Patient will need 30-day event monitor.

## 2021-03-11 NOTE — Hospital Course (Addendum)
65 year old male with past medical history of hypertension & diabetes presented to the emergency room at [facility] regional on 12/21 with left-sided weakness and CT scan of head noted 4 mm intraparenchymal hematoma in the right posterior frontal lobe with subarachnoid penetration.  Patient started on Keppra and Cleviprex.  Also incidentally noted to have rash on left side of chest consistent with shingles.  Transferred to [facility] for neuro ICU.  Follow-up MRI reveals new right occipital lobe infarction and enlargement of posterior frontal infarction.  Cleviprex stopped.  Concerns for embolic etiology.  Work-up complete with plans for 30-day event recorder.  Seen by PT and OT initially recommended inpatient rehab.

## 2021-03-11 NOTE — Progress Notes (Signed)
[facility] Progress Note  Patient: Joseph Ortega    OZD:[id number]  DOA: 03/08/2021    Date of Service: the patient was seen and examined on 03/11/2021  Brief hospital course: 65 year old male with past medical history of hypertension diabetes presented to the emergency room at [facility] regional on 12/21 with left-sided weakness and CT scan of head noted 4 mm intraparenchymal hematoma in the right posterior frontal lobe with subarachnoid penetration.  Patient started on Keppra and Cleviprex.  Also incidentally noted to have rash on left side of chest consistent with shingles.  Transferred to [facility] for neuro ICU.  Follow-up MRI reveals new right occipital lobe infarction and enlargement of posterior frontal infarction.  Cleviprex stopped.  Concerns for embolic etiology and so patient started on loop recorder.  Seen by PT and OT who recommended inpatient rehab, who are currently evaluating.  Assessment and Plan: Cardiovascular and Mediastinum Essential hypertension Assessment & Plan On valsartan and metoprolol.  Initially allowed for permissive hypertension.  Nervous and Auditory * SAH (subarachnoid hemorrhage) [facility]) Assessment & Plan Appreciate neurology help.  Patient with right frontal localized intraparenchymal hemorrhage and underlying hemorrhagic infarct with etiology indeterminant possibly secondary to venous angioma.  Repeat MRI on 12/23 notes no acute infarction right occipital lobe enlargement of posterior frontal infarction.  On Keppra 500 twice a day.  Needs inpatient rehab.  LDL at 106.  A1c 11.1.  Not on any antithrombotics currently.  Repeat CTA scan of head on 4/23 notes new cerebral edema as well as regression of small volume acute right hemisphere hemorrhage.  Other Morbid obesity (HCC) Assessment & Plan Meets criteria BMI greater than 35+ comorbidities of diabetes and hypertension and CVA  Tobacco abuse Assessment & Plan Counseled.  Nicotine  patch.    Body mass index is 38.13 kg/m.        Consultants: Neurology Critical care Neurosurgery Interventional radiology  Procedures: None  Antimicrobials: None  Code Status: Full code   Subjective: No complaints  Objective: Vital signs were reviewed and unremarkable. Vitals:   03/11/21 1141 03/11/21 1559  BP: 122/63 121/62  Pulse: 67 69  Resp: 20 20  Temp: 98.7 F (37.1 C) 97.7 F (36.5 C)  SpO2: 91% 91%    Intake/Output Summary (Last 24 hours) at 03/11/2021 1737 Last data filed at 03/11/2021 0800 Gross per 24 hour  Intake --  Output 630 ml  Net -630 ml   Filed Weights   03/10/21 0500 03/11/21 0340  Weight: 129.8 kg 131.1 kg   Body mass index is 38.13 kg/m.  Exam:  General: Alert and oriented x3, no acute distress HEENT: Normocephalic and atraumatic, mucous membranes are moist Cardiovascular: Regular rate and rhythm, S1-S2 Respiratory: Clear to auscultation bilaterally Abdomen: Soft, nontender, nondistended, positive bowel sounds Musculoskeletal: No clubbing or cyanosis or edema.  See below for left-sided strength Skin: No skin breaks, tears or lesions Psychiatry: Appropriate, no evidence of psychoses Neurology: Right-sided hemiplegia left-sided hemiplegia complete on left upper extremity with some minimal strength on left lower extremity  Data Reviewed: Repeat CTA of head notes edema as above  Disposition:  Status is: Inpatient  Remains inpatient appropriate because: Further work-up, needs inpatient rehab   Family Communication: Left message for wife  DVT Prophylaxis: enoxaparin (LOVENOX) injection 40 mg Start: 03/10/21 1000 Place and maintain sequential compression device Start: 03/08/21 1317 SCDs Start: 03/08/21 1303    Author: Hollice Espy ,MD 03/11/2021 5:37 PM  To reach On-call, see care teams to locate the attending  and reach out via [email or web address]. Between 7PM-7AM, please contact night-coverage If you still have  difficulty reaching the attending provider, please page the [facility] (Director on Call) for [facility] on amion for assistance.

## 2021-03-11 NOTE — Progress Notes (Addendum)
STROKE TEAM PROGRESS NOTE   INTERVAL HISTORY Patient seen in room with no family at the bedside.  He has been hemodynamically stable and his neurological exam remains stable.  Will obtain repeat CTA head to further investigate new stroke.  Plan to start low dose aspirin if negative for continued bleeding Vitals:   03/11/21 0340 03/11/21 0755 03/11/21 0849 03/11/21 1025  BP: (!) 141/70 (!) 160/73 (!) 143/67 122/60  Pulse: 70 87 94   Resp: 18 16 (!) 21   Temp: 98.3 F (36.8 C) 98.4 F (36.9 C)    TempSrc: Oral Oral    SpO2: 96% 100% 96%   Weight: 131.1 kg      CBC:  Recent Labs  Lab 03/08/21 0900 03/09/21 0130 03/10/21 0326 03/11/21 0344  WBC 7.3   < > 6.0 5.5  NEUTROABS 4.7  --   --   --   HGB 14.3   < > 13.7 13.4  HCT 41.0   < > 40.1 40.6  MCV 93.4   < > 94.4 95.3  PLT 221   < > 177 151   < > = values in this interval not displayed.    Basic Metabolic Panel:  Recent Labs  Lab 03/08/21 1528 03/09/21 0130 03/10/21 0326 03/11/21 0344  NA  --    < > 133* 137  K  --    < > 4.3 4.1  CL  --    < > 104 105  CO2  --    < > 24 23  GLUCOSE  --    < > 306* 274*  BUN  --    < > 15 19  CREATININE  --    < > 1.01 0.92  CALCIUM  --    < > 8.2* 8.6*  MG 2.2  --   --   --    < > = values in this interval not displayed.    Lipid Panel:  Recent Labs  Lab 03/10/21 0326  CHOL 156  TRIG 70   72  HDL 36*  CHOLHDL 4.3  VLDL 14  LDLCALC 443*    HgbA1c:  Recent Labs  Lab 03/10/21 0326  HGBA1C 11.1*    Urine Drug Screen:  Recent Labs  Lab 03/08/21 1003  LABOPIA NONE DETECTED  COCAINSCRNUR NONE DETECTED  LABBENZ NONE DETECTED  AMPHETMU NONE DETECTED  THCU NONE DETECTED  LABBARB NONE DETECTED     Alcohol Level No results for input(s): ETH in the last 168 hours.  IMAGING past 24 hours No results found.  PHYSICAL EXAM General: Alert, well-developed male in no acute distress.  NEURO:  Mental Status: AA&Ox3  Speech/Language: speech is without dysarthria or  aphasia.  Naming, repetition, fluency, and comprehension intact.  Cranial Nerves:  II: PERRL. Left hemianopsia III, IV, VI: EOMI. Eyelids elevate symmetrically.  V: Sensation is intact to light touch and symmetrical to face.  VII: Smile is symmetrical.  VIII: hearing intact to voice. IX, X:Phonation is normal.  XII: tongue is midline without fasciculations. Motor: 5/5 strength to RUE and RLE, 0/5 to LUE and 2/5 to LLE Tone: is normal and bulk is normal Sensation- Intact to light touch bilaterally. Extinction absent to light touch to DSS.    Coordination: No drift.  Gait- deferred   ASSESSMENT/PLAN [name] is a 65 y.o. male with history of Type 2 diabetes, hypertension, hypothyroidism. Patient states on 12/21 at 2 AM he woke up and felt normal.  At 7:30 AM  patient noticed sudden onset left-sided weakness.  Symptoms gradually improved.  Still having weakness in left arm.  Denies speech concerns.  Patient also has rash under left chest  appears to be shingles.  CT head shows 4 mm intraparenchymal hematoma in right posterior frontal lobe with subarachnoid penetration.  Left sided deficits and mental status stable since initial evaluation. Neurosurgery consulted.  SBP goal <160.  Cleviprex has been tapered off and patient can be transferred out of the ICU.  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  Due to increased likelihood of embolic etiology of strokes, patient will need loop recorder before discharge.  Will obtain repeat CTA head to further investigate new stroke today.  Right frontal localized intraparenchymal hemorrhage with subarachnoid hemorrhage and underlying hemorrhagic infarct etiology indeterminate possibly secondary to venous angioma which has bled.  Doubt zoster vasculopathy Code Stroke SAH with 88mm IPH CTA head & neck 1-24mm infundibulum vs aneurysm arising from paraclinoid L ICA 12/22- Cerebral  angio -No angio evidence of of DAVF,AV shunting,AVM ,aneurysms or of dissections or vasculitis MRI localized acute infarction at the right posterior frontal vertex with a 6 mm associated IPH.  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  Therapy recommendations: CIR Disposition: Pending Keppra 500mg  q 12hr  Hypertension Home meds: Valsartan 320mg , metoprolol tartrate 50 mg Stable 130-150 for 24 hours then relax goal to 160 Long-term BP goal normotensive Start Avapro 300 mg, metoprolol tartrate 50 mg  Diabetes type II uncontrolled Home meds: Levemir HgbA1c 11.1, goal < 7.0 CBGs Recent Labs    03/10/21 2312 03/11/21 0356 03/11/21 0740  GLUCAP 312* 265* 268*    Diabetes coordinator consult SSI and levemir  Other Stroke Risk Factors Cigarette smoker, advised to stop smoking ETOH use, alcohol level No results found for requested labs within last 26280 hours., advised to drink no more than 2 drink(s) a day Obesity, Body mass index is 38.13 kg/m., BMI >/= 30 associated with increased stroke risk, recommend weight loss, diet and exercise as appropriate  Coronary artery disease  Other Active Problems Shingles CCM and ID management valtrex 1g TID for 2-3 weeks Obstructive sleep apnea Home BiPAP at night and with naps  Hospital day # 3  Patient seen and examined by NP/APP with MD. MD to update note as needed.   [place] , MSN, AGACNP-BC [facility] See Amion for schedule and pager information 03/11/2021 11:19 AM  ATTENDING ATTESTATION:  Pt with IPH with SAH extension. Repeat MRI shows new CVA which complicates his case. Recommend repeat CTA to ensure  there is no thrombus or new bleeding.  If neg, will start aspiring 81mg  (start with low dose due to [facility] stroke preventative. He is on lovenox. No signs of DVT on ecam. Will get Echo with bubble study today.   Dr. Reeves Forth evaluated pt independently, reviewed imaging, chart, labs. Discussed and formulated plan with the APP. Please see APP note above for details.   Total 30 minutes spent on counseling patient and coordinating care, writing notes and reviewing chart.   Jerime Arif,MD      To contact Stroke Continuity provider, please refer to [email or web address] After hours, contact General Neurology

## 2021-03-11 NOTE — Assessment & Plan Note (Signed)
Counseled.  Nicotine patch ?

## 2021-03-12 ENCOUNTER — Inpatient Hospital Stay (HOSPITAL_COMMUNITY): Payer: BC Managed Care – PPO

## 2021-03-12 DIAGNOSIS — E119 Type 2 diabetes mellitus without complications: Secondary | ICD-10-CM | POA: Diagnosis present

## 2021-03-12 DIAGNOSIS — F32A Depression, unspecified: Secondary | ICD-10-CM | POA: Diagnosis present

## 2021-03-12 DIAGNOSIS — F329 Major depressive disorder, single episode, unspecified: Secondary | ICD-10-CM | POA: Diagnosis present

## 2021-03-12 DIAGNOSIS — E039 Hypothyroidism, unspecified: Secondary | ICD-10-CM | POA: Diagnosis present

## 2021-03-12 DIAGNOSIS — H5789 Other specified disorders of eye and adnexa: Secondary | ICD-10-CM

## 2021-03-12 DIAGNOSIS — I639 Cerebral infarction, unspecified: Secondary | ICD-10-CM

## 2021-03-12 DIAGNOSIS — G4733 Obstructive sleep apnea (adult) (pediatric): Secondary | ICD-10-CM | POA: Diagnosis present

## 2021-03-12 DIAGNOSIS — E1165 Type 2 diabetes mellitus with hyperglycemia: Secondary | ICD-10-CM

## 2021-03-12 LAB — GLUCOSE, CAPILLARY
Glucose-Capillary: 243 mg/dL — ABNORMAL HIGH (ref 70–99)
Glucose-Capillary: 208 mg/dL — ABNORMAL HIGH (ref 70–99)
Glucose-Capillary: 265 mg/dL — ABNORMAL HIGH (ref 70–99)
Glucose-Capillary: 216 mg/dL — ABNORMAL HIGH (ref 70–99)
Glucose-Capillary: 187 mg/dL — ABNORMAL HIGH (ref 70–99)

## 2021-03-12 LAB — CBC
HCT: 39.3 % (ref 39.0–52.0)
Hemoglobin: 13.1 g/dL (ref 13.0–17.0)
MCH: 32 pg (ref 26.0–34.0)
MCHC: 33.3 g/dL (ref 30.0–36.0)
MCV: 95.9 fL (ref 80.0–100.0)
Platelets: 144 10*3/uL — ABNORMAL LOW (ref 150–400)
RBC: 4.1 MIL/uL — ABNORMAL LOW (ref 4.22–5.81)
RDW: 13.9 % (ref 11.5–15.5)
WBC: 5.7 10*3/uL (ref 4.0–10.5)
nRBC: 0 % (ref 0.0–0.2)

## 2021-03-12 LAB — BASIC METABOLIC PANEL
Anion gap: 7 (ref 5–15)
BUN: 17 mg/dL (ref 8–23)
CO2: 26 mmol/L (ref 22–32)
Calcium: 8.9 mg/dL (ref 8.9–10.3)
Chloride: 104 mmol/L (ref 98–111)
Creatinine, Ser: 0.92 mg/dL (ref 0.61–1.24)
GFR, Estimated: >60 mL/min (ref >60)
Glucose, Bld: 233 mg/dL — ABNORMAL HIGH (ref 70–99)
Potassium: 3.9 mmol/L (ref 3.5–5.1)
Sodium: 137 mmol/L (ref 135–145)

## 2021-03-12 MED ORDER — MIRTAZAPINE 15 MG PO TABS
7.5000 mg | ORAL_TABLET | Freq: Every day | ORAL | Status: DC
  Administered 2021-03-12 – 2021-03-16 (×5): 7.5 mg via ORAL
  Filled 2021-03-12 (×5): qty 1

## 2021-03-12 MED ORDER — TRAZODONE HCL 50 MG PO TABS
50.0000 mg | ORAL_TABLET | Freq: Every day | ORAL | Status: DC
  Administered 2021-03-12 – 2021-03-16 (×5): 50 mg via ORAL
  Filled 2021-03-12 (×5): qty 1

## 2021-03-12 MED ORDER — NAPHAZOLINE-GLYCERIN 0.012-0.25 % OP SOLN
1.0000 [drp] | Freq: Four times a day (QID) | OPHTHALMIC | Status: DC | PRN
  Filled 2021-03-12: qty 15

## 2021-03-12 MED ORDER — INSULIN DETEMIR 100 UNIT/ML ~~LOC~~ SOLN
27.0000 [IU] | Freq: Two times a day (BID) | SUBCUTANEOUS | Status: DC
  Administered 2021-03-12 – 2021-03-15 (×6): 27 [IU] via SUBCUTANEOUS
  Filled 2021-03-12 (×7): qty 0.27

## 2021-03-12 MED ORDER — ASPIRIN EC 81 MG PO TBEC
81.0000 mg | DELAYED_RELEASE_TABLET | Freq: Every day | ORAL | Status: DC
  Administered 2021-03-12 – 2021-03-17 (×6): 81 mg via ORAL
  Filled 2021-03-12 (×6): qty 1

## 2021-03-12 MED ORDER — INSULIN ASPART 100 UNIT/ML IJ SOLN
10.0000 [IU] | Freq: Three times a day (TID) | INTRAMUSCULAR | Status: DC
  Administered 2021-03-12 – 2021-03-16 (×12): 10 [IU] via SUBCUTANEOUS

## 2021-03-12 MED ORDER — DULOXETINE HCL 20 MG PO CPEP
20.0000 mg | ORAL_CAPSULE | Freq: Two times a day (BID) | ORAL | Status: DC
  Administered 2021-03-12 – 2021-03-17 (×10): 20 mg via ORAL
  Filled 2021-03-12 (×12): qty 1

## 2021-03-12 NOTE — Assessment & Plan Note (Addendum)
Home medicines restarted

## 2021-03-12 NOTE — Assessment & Plan Note (Signed)
Elevated TSH, but normal T4 and likely in the context of other acute medical problems.  Would not make any adjustments in Synthroid at this time and recheck labs 6 weeks after discharge.

## 2021-03-12 NOTE — Progress Notes (Signed)
Bilateral lower extremity venous duplex has been completed. Preliminary results can be found in CV Proc through chart review.   03/12/21 4:05 PM Olen Cordial RVT

## 2021-03-12 NOTE — Assessment & Plan Note (Signed)
-  Continue nightly BiPAP 

## 2021-03-12 NOTE — Progress Notes (Signed)
[facility] Progress Note  Patient: Joseph Ortega    RAX:[id number]  DOA: 03/08/2021    Date of Service: the patient was seen and examined on 03/12/2021  Brief hospital course: 65 year old male with past medical history of hypertension & diabetes presented to the emergency room at [facility] regional on 12/21 with left-sided weakness and CT scan of head noted 4 mm intraparenchymal hematoma in the right posterior frontal lobe with subarachnoid penetration.  Patient started on Keppra and Cleviprex.  Also incidentally noted to have rash on left side of chest consistent with shingles.  Transferred to [facility] for neuro ICU.  Follow-up MRI reveals new right occipital lobe infarction and enlargement of posterior frontal infarction.  Cleviprex stopped.  Concerns for embolic etiology and so patient started on loop recorder.  Seen by PT and OT who recommended inpatient rehab, who are currently evaluating.  Assessment and Plan: Cardiovascular and Mediastinum Essential hypertension Assessment & Plan On valsartan and metoprolol.  Initially allowed for permissive hypertension.  Blood pressures stable.  Respiratory OSA (obstructive sleep apnea) Assessment & Plan Continue nightly BiPAP  Endocrine Hypothyroidism Assessment & Plan Elevated TSH, but normal T4 and likely in the context of other acute medical problems.  Would not make any adjustments in Synthroid at this time and recheck labs 6 weeks after discharge.  Nervous and Auditory * SAH (subarachnoid hemorrhage) [facility]) Assessment & Plan Appreciate neurology help.  Patient with right frontal localized intraparenchymal hemorrhage and underlying hemorrhagic infarct with etiology indeterminant possibly secondary to venous angioma.  Repeat MRI on 12/23 notes no acute infarction right occipital lobe enlargement of posterior frontal infarction.  On Keppra 500 twice a day.  Needs inpatient rehab.  LDL at 106.  A1c 11.1.  Not on any  antithrombotics currently.  Repeat CTA scan of head on 4/23 notes new cerebral edema as well as regression of small volume acute right hemisphere hemorrhage.  Other Redness of both eyes Assessment & Plan No pain or loss of vision.  Both eyes look to be mildly injected, irritated.  We will start with some eyedrops.  Tobacco abuse Assessment & Plan Counseled.  Nicotine patch.  Depression Assessment & Plan We will plan to resume home medications today  Herpes zoster with complication Assessment & Plan Left anterior chest around T10 dermatome.  On Valtrex 3 times daily for 2 to 3 weeks.  Morbid obesity (HCC) Assessment & Plan Meets criteria BMI greater than 35+ comorbidities of diabetes and hypertension and CVA       Body mass index is 38.57 kg/m.        Consultants: Neurology Critical care Neurosurgery Interventional radiology  Procedures: None  Antimicrobials: None  Code Status: Full code   Subjective: Patient complains of left elbow pain, worse when trying to straighten it  Objective: Vital signs were reviewed and unremarkable. Vitals:   03/12/21 0822 03/12/21 1114  BP: 133/69 (!) 169/74  Pulse: 68 82  Resp: 13 (!) 22  Temp: 99 F (37.2 C) 98.5 F (36.9 C)  SpO2: 97% 93%    Intake/Output Summary (Last 24 hours) at 03/12/2021 1442 Last data filed at 03/12/2021 1114 Gross per 24 hour  Intake --  Output 225 ml  Net -225 ml    Filed Weights   03/10/21 0500 03/11/21 0340 03/12/21 0500  Weight: 129.8 kg 131.1 kg 132.6 kg   Body mass index is 38.57 kg/m.  Exam:  General: Alert and oriented x3, no acute distress HEENT: Normocephalic and atraumatic, mucous membranes are  moist.  Both eyes note some injection of sclera. Cardiovascular: Regular rate and rhythm, S1-S2 Respiratory: Clear to auscultation bilaterally Abdomen: Soft, nontender, nondistended, positive bowel sounds Musculoskeletal: No clubbing or cyanosis or edema.  See below for  left-sided strength.  Left elbow is tender to touch and straightening.  No skin breakdown at left elbow. Skin: No skin breaks, tears or lesions Psychiatry: Appropriate, no evidence of psychoses Neurology: Right-sided hemiplegia left-sided hemiplegia complete on left upper extremity with some minimal strength on left lower extremity  Data Reviewed: Repeat CTA of head notes edema as above  Disposition:  Status is: Inpatient  Remains inpatient appropriate because: Further work-up, needs inpatient rehab   Family Communication: Left message for wife  DVT Prophylaxis: enoxaparin (LOVENOX) injection 40 mg Start: 03/10/21 1000 Place and maintain sequential compression device Start: 03/08/21 1317 SCDs Start: 03/08/21 1303    Author: Hollice Espy ,MD 03/12/2021 2:42 PM  To reach On-call, see care teams to locate the attending and reach out via [email or web address]. Between 7PM-7AM, please contact night-coverage If you still have difficulty reaching the attending provider, please page the [facility] (Director on Call) for [facility] on amion for assistance.

## 2021-03-12 NOTE — Progress Notes (Signed)
STROKE TEAM PROGRESS NOTE   INTERVAL HISTORY Patient seen in room with no family at the bedside.  He has been hemodynamically stable and his neurological exam remains stable.  Repeat CT is sable. Aspirin started. Vitals:   03/12/21 0359 03/12/21 0500 03/12/21 0822 03/12/21 1114  BP: 104/81  133/69 (!) 169/74  Pulse: 75  68 82  Resp: 19  13 (!) 22  Temp: 98.3 F (36.8 C)  99 F (37.2 C) 98.5 F (36.9 C)  TempSrc: Oral  Axillary Oral  SpO2: 92%  97% 93%  Weight:  132.6 kg     CBC:  Recent Labs  Lab 03/08/21 0900 03/09/21 0130 03/11/21 0344 03/12/21 0622  WBC 7.3   < > 5.5 5.7  NEUTROABS 4.7  --   --   --   HGB 14.3   < > 13.4 13.1  HCT 41.0   < > 40.6 39.3  MCV 93.4   < > 95.3 95.9  PLT 221   < > 151 144*   < > = values in this interval not displayed.   Basic Metabolic Panel:  Recent Labs  Lab 03/08/21 1528 03/09/21 0130 03/11/21 0344 03/12/21 0622  NA  --    < > 137 137  K  --    < > 4.1 3.9  CL  --    < > 105 104  CO2  --    < > 23 26  GLUCOSE  --    < > 274* 233*  BUN  --    < > 19 17  CREATININE  --    < > 0.92 0.92  CALCIUM  --    < > 8.6* 8.9  MG 2.2  --   --   --    < > = values in this interval not displayed.   Lipid Panel:  Recent Labs  Lab 03/10/21 0326  CHOL 156  TRIG 70   72  HDL 36*  CHOLHDL 4.3  VLDL 14  LDLCALC 106*   HgbA1c:  Recent Labs  Lab 03/10/21 0326  HGBA1C 11.1*   Urine Drug Screen:  Recent Labs  Lab 03/08/21 1003  LABOPIA NONE DETECTED  COCAINSCRNUR NONE DETECTED  LABBENZ NONE DETECTED  AMPHETMU NONE DETECTED  THCU NONE DETECTED  LABBARB NONE DETECTED    Alcohol Level No results for input(s): ETH in the last 168 hours.  IMAGING past 24 hours CT ANGIO HEAD W OR WO CONTRAST  Result Date: 03/11/2021 CLINICAL DATA:  Stroke follow-up EXAM: CT ANGIOGRAPHY HEAD TECHNIQUE: Multidetector CT imaging of the head was performed using the standard protocol during bolus administration of intravenous contrast. Multiplanar CT  image reconstructions and MIPs were obtained to evaluate the vascular anatomy. CONTRAST:  8mL OMNIPAQUE IOHEXOL 350 MG/ML SOLN COMPARISON:  Head CT 03/10/2021 FINDINGS: CT HEAD Brain: Expected evolution of early subacute infarct of the right PCA territory. Redistribution of subarachnoid blood within the right central sulcus and posterior right hemisphere. No new site of hemorrhage. Edema in the posterior right frontal lobe has increased. No midline shift or other mass effect. No hydrocephalus. Vascular: No abnormal hyperdensity of the major intracranial arteries or dural venous sinuses. No intracranial atherosclerosis. Skull: The visualized skull base, calvarium and extracranial soft tissues are normal. Sinuses/Orbits: No fluid levels or advanced mucosal thickening of the visualized paranasal sinuses. No mastoid or middle ear effusion. The orbits are normal. CTA HEAD POSTERIOR CIRCULATION: --Vertebral arteries: Normal --Inferior cerebellar arteries: Normal. --Basilar artery: Normal. --Superior cerebellar  arteries: Normal. --Posterior cerebral arteries: Focal calcification within the right PCA distal P3 segment, likely calcified thromboembolism. The right PCA is patent distal to this area and otherwise normal. Left PCA normal. ANTERIOR CIRCULATION: --Intracranial internal carotid arteries: Normal. --Anterior cerebral arteries (ACA): Normal. --Middle cerebral arteries (MCA): Normal. ANATOMIC VARIANTS: None Review of the MIP images confirms the above findings. IMPRESSION: 1. No aneurysm. 2. Expected evolution of early subacute infarct of the right PCA territory with slightly increased edema in the posterior right frontal lobe. 3. Redistribution of subarachnoid blood within the right central sulcus and posterior right hemisphere. No new site of hemorrhage. 4. No intracranial arterial occlusion or high-grade stenosis. 5. Focal calcification within the right PCA distal P3 segment, likely calcified thromboembolism.  Electronically Signed   By: Deatra Robinson M.D.   On: 03/11/2021 20:17    PHYSICAL EXAM General: Alert, well-developed male in no acute distress.  NEURO:  Mental Status: AA&Ox3  Speech/Language: speech is without dysarthria or aphasia.  Naming, repetition, fluency, and comprehension intact.  Cranial Nerves:  II: PERRL. Left hemianopsia III, IV, VI: EOMI. Eyelids elevate symmetrically.  V: Sensation is intact to light touch and symmetrical to face.  VII: Smile is symmetrical.  VIII: hearing intact to voice. IX, X:Phonation is normal.  XII: tongue is midline without fasciculations. Motor: 5/5 strength to RUE and RLE, 0/5 to LUE and 2/5 to LLE Tone: is normal and bulk is normal Sensation- Intact to light touch bilaterally. Extinction absent to light touch to DSS.    Coordination: No drift.  Gait- deferred   ASSESSMENT/PLAN [name] is a 65 y.o. male with history of Type 2 diabetes, hypertension, hypothyroidism. Patient states on 12/21 at 2 AM he woke up and felt normal.  At 7:30 AM patient noticed sudden onset left-sided weakness.  Symptoms gradually improved.  Still having weakness in left arm.  Denies speech concerns.  Patient also has rash under left chest  appears to be shingles.  CT head shows 4 mm intraparenchymal hematoma in right posterior frontal lobe with subarachnoid penetration.  Left sided deficits and mental status stable since initial evaluation. Neurosurgery consulted.  SBP goal <160.  Cleviprex has been tapered off and patient can be transferred out of the ICU.  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  Due to increased likelihood of embolic etiology of strokes, patient will need loop recorder before discharge.  Repeat head CT 12/24 is stable. Aspirin 81mg  started.  Right frontal localized intraparenchymal hemorrhage with subarachnoid hemorrhage and underlying hemorrhagic infarct etiology  indeterminate possibly secondary to venous angioma which has bled.  Doubt zoster vasculopathy Code Stroke SAH with 74mm IPH CTA head & neck 1-60mm infundibulum vs aneurysm arising from paraclinoid L ICA 12/22- Cerebral angio -No angio evidence of of DAVF,AV shunting,AVM ,aneurysms or of dissections or vasculitis MRI localized acute infarction at the right posterior frontal vertex with a 6 mm associated IPH.  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  Therapy recommendations: CIR Disposition: Pending Keppra 500mg  q 12hr  Hypertension Home meds: Valsartan 320mg , metoprolol tartrate 50 mg Stable 130-150 for 24 hours then relax goal to 160 Long-term BP goal normotensive Start Avapro 300 mg, metoprolol  tartrate 50 mg  Diabetes type II uncontrolled Home meds: Levemir HgbA1c 11.1, goal < 7.0 CBGs Recent Labs    03/12/21 0401 03/12/21 0844 03/12/21 1254  GLUCAP 243* 208* 265*   Diabetes coordinator consult SSI and levemir  Other Stroke Risk Factors Cigarette smoker, advised to stop smoking ETOH use, alcohol level No results found for requested labs within last 26280 hours., advised to drink no more than 2 drink(s) a day Obesity, Body mass index is 38.57 kg/m., BMI >/= 30 associated with increased stroke risk, recommend weight loss, diet and exercise as appropriate  Coronary artery disease  Other Active Problems Shingles CCM and ID management valtrex 1g TID for 2-3 weeks Obstructive sleep apnea Home BiPAP at night and with naps  Hospital day # 4  Repeat HCT stable. Aspiring 81mg  started for stroke prophylaxis.   Total of 30 mins spent reviewing chart, discussion with patient and  family on prognosis, Dx and plan. Discussed case with patient's nurse. Reviewed Imaging personally.   To contact Stroke Continuity provider, please refer to [email or web address] After hours, contact General Neurology

## 2021-03-12 NOTE — Assessment & Plan Note (Addendum)
Left anterior chest around T10 dermatome.  On Valtrex 3 times daily for 2 to 3 weeks.  Will discontinue airborne precautions.  Lesions scabbed over.

## 2021-03-12 NOTE — Progress Notes (Signed)
Inpatient Diabetes Program Recommendations  AACE/ADA: New Consensus Statement on Inpatient Glycemic Control (2015)  Target Ranges:  Prepandial:   less than 140 mg/dL      Peak postprandial:   less than 180 mg/dL (1-2 hours)      Critically ill patients:  140 - 180 mg/dL    Latest Reference Range & Units 03/10/21 23:12 03/11/21 03:56 03/11/21 07:40 03/11/21 11:42 03/11/21 15:57 03/11/21 19:40  Glucose-Capillary 70 - 99 mg/dL 387 (H) 564 (H) 332 (H)  16 units Novolog  40 units Levemir @0916   260 (H)  8 units Novolog  220 (H)  13 units Novolog  216 (H)  2 units Novolog @2112   10 units Levemir @2221     Latest Reference Range & Units 03/11/21 23:15 03/12/21 04:01 03/12/21 08:44  Glucose-Capillary 70 - 99 mg/dL 03/13/21 (H) 03/14/21 (H) 03/14/21 (H)    Home DM Meds: Levemir 40 units daily     Novolog 10 units TID     Trulicity 3 mg Qwk     Farxiga 10 mg Daily   Current Orders: Levemir 25 units BID     Novolog Moderate Correction Scale/ SSI (0-15 units) TID AC + HS      Novolog 8 units TID with meals    Note pt got total of 50 units Levemir yesterday.  Dose adjusted to 25 units BID today.  MD- Please consider:  1. Increase Levemir slightly to 27 units BID--CBG 208 this AM  2. Increase Novolog Meal Coverage to 10 units TID    --Will follow patient during hospitalization--  [phone] RN, MSN, CDE Diabetes Coordinator Inpatient Glycemic Control Team Team Pager: [phone] (8a-5p)

## 2021-03-12 NOTE — Assessment & Plan Note (Addendum)
No pain or loss of vision.  Started eyedrops on 12/25.  Both eyes have since improved

## 2021-03-12 NOTE — Progress Notes (Signed)
Placed patient on BIPAP for HS use, previous settings. 21% FIO2

## 2021-03-13 DIAGNOSIS — I63312 Cerebral infarction due to thrombosis of left middle cerebral artery: Secondary | ICD-10-CM

## 2021-03-13 LAB — CBC
HCT: 40.9 % (ref 39.0–52.0)
Hemoglobin: 13.9 g/dL (ref 13.0–17.0)
MCH: 32.2 pg (ref 26.0–34.0)
MCHC: 34 g/dL (ref 30.0–36.0)
MCV: 94.7 fL (ref 80.0–100.0)
Platelets: 152 10*3/uL (ref 150–400)
RBC: 4.32 MIL/uL (ref 4.22–5.81)
RDW: 13.5 % (ref 11.5–15.5)
WBC: 5.5 10*3/uL (ref 4.0–10.5)
nRBC: 0 % (ref 0.0–0.2)

## 2021-03-13 LAB — BASIC METABOLIC PANEL
Anion gap: 9 (ref 5–15)
BUN: 15 mg/dL (ref 8–23)
CO2: 25 mmol/L (ref 22–32)
Calcium: 8.7 mg/dL — ABNORMAL LOW (ref 8.9–10.3)
Chloride: 105 mmol/L (ref 98–111)
Creatinine, Ser: 0.8 mg/dL (ref 0.61–1.24)
GFR, Estimated: >60 mL/min (ref >60)
Glucose, Bld: 204 mg/dL — ABNORMAL HIGH (ref 70–99)
Potassium: 4.1 mmol/L (ref 3.5–5.1)
Sodium: 139 mmol/L (ref 135–145)

## 2021-03-13 LAB — GLUCOSE, CAPILLARY
Glucose-Capillary: 176 mg/dL — ABNORMAL HIGH (ref 70–99)
Glucose-Capillary: 295 mg/dL — ABNORMAL HIGH (ref 70–99)
Glucose-Capillary: 245 mg/dL — ABNORMAL HIGH (ref 70–99)
Glucose-Capillary: 193 mg/dL — ABNORMAL HIGH (ref 70–99)

## 2021-03-13 NOTE — Progress Notes (Signed)
[facility] Progress Note  Patient: Joseph Ortega    KXF:[id number]  DOA: 03/08/2021    Date of Service: the patient was seen and examined on 03/13/2021  Brief hospital course: 65 year old male with past medical history of hypertension & diabetes presented to the emergency room at [facility] on 12/21 with left-sided weakness and CT scan of head noted 4 mm intraparenchymal hematoma in the right posterior frontal lobe with subarachnoid penetration.  Patient started on Keppra and Cleviprex.  Also incidentally noted to have rash on left side of chest consistent with shingles.  Transferred to [facility] for neuro ICU.  Follow-up MRI reveals new right occipital lobe infarction and enlargement of posterior frontal infarction.  Cleviprex stopped.  Concerns for embolic etiology and so patient started on loop recorder.  Seen by PT and OT who recommended inpatient rehab, who are currently evaluating.  Assessment and Plan: Cardiovascular and Mediastinum Essential hypertension Assessment & Plan On valsartan and metoprolol.  Initially allowed for permissive hypertension.  Blood pressures stable, although had some slightly low blood pressures last night  Respiratory OSA (obstructive sleep apnea) Assessment & Plan Continue nightly BiPAP  Endocrine Hypothyroidism Assessment & Plan Elevated TSH, but normal T4 and likely in the context of other acute medical problems.  Would not make any adjustments in Synthroid at this time and recheck labs 6 weeks after discharge.  Nervous and Auditory * SAH (subarachnoid hemorrhage) [facility]) Assessment & Plan Appreciate neurology help.  Patient with right frontal localized intraparenchymal hemorrhage and underlying hemorrhagic infarct with etiology indeterminant possibly secondary to venous angioma.  Repeat MRI on 12/23 notes new acute infarction of right occipital lobe and enlargement of posterior frontal infarction.  On Keppra 500 twice a day.  Needs  inpatient rehab.  LDL at 106.  A1c 11.1.  Not on any antithrombotics currently.  Repeat CTA scan of head on 12 /23 notes new cerebral edema as well as regression of small volume acute right hemisphere hemorrhage.  Other Redness of both eyes Assessment & Plan No pain or loss of vision.  Started eyedrops on 12/25.  Right eye with significant improvement and left eye still mildly injected  Tobacco abuse Assessment & Plan Counseled.  Nicotine patch.  Depression Assessment & Plan We will plan to resume home medications today  Herpes zoster with complication Assessment & Plan Left anterior chest around T10 dermatome.  On Valtrex 3 times daily for 2 to 3 weeks.  Morbid obesity (HCC) Assessment & Plan Meets criteria BMI greater than 35+ comorbidities of diabetes and hypertension and CVA       Body mass index is 37.72 kg/m.        Consultants: Neurology Critical care Neurosurgery Interventional radiology  Procedures: None  Antimicrobials: None  Code Status: Full code   Subjective: Patient feeling better today.  Feels a little stronger.  Objective: Vital signs were reviewed and unremarkable. Vitals:   03/13/21 0836 03/13/21 1205  BP:  129/65  Pulse:  64  Resp:  20  Temp: 98.6 F (37 C) 99.2 F (37.3 C)  SpO2:  92%    Intake/Output Summary (Last 24 hours) at 03/13/2021 1400 Last data filed at 03/13/2021 0900 Gross per 24 hour  Intake 240 ml  Output 950 ml  Net -710 ml    Filed Weights   03/11/21 0340 03/12/21 0500 03/13/21 0500  Weight: 131.1 kg 132.6 kg 129.7 kg   Body mass index is 37.72 kg/m.  Exam:  General: Alert and oriented x3, no  acute distress HEENT: Normocephalic and atraumatic, mucous membranes are moist.  Left eye with mild injection of sclera Cardiovascular: Regular rate and rhythm, S1-S2 Respiratory: Clear to auscultation bilaterally Abdomen: Soft, nontender, nondistended, positive bowel sounds Musculoskeletal: No clubbing or  cyanosis or edema.  See below for left-sided strength.   Skin: No skin breaks, tears or lesions Psychiatry: Appropriate, no evidence of psychoses Neurology: Right-sided hemiplegia left-sided hemiplegia complete on left upper extremity with some minimal strength on left lower extremity  Data Reviewed: Repeat CTA of head notes edema as above  Disposition:  Status is: Inpatient  Remains inpatient appropriate because: Further work-up, needs inpatient rehab   Family Communication: Left message for wife  DVT Prophylaxis: enoxaparin (LOVENOX) injection 40 mg Start: 03/10/21 1000 Place and maintain sequential compression device Start: 03/08/21 1317 SCDs Start: 03/08/21 1303    Author: Hollice Espy ,MD 03/13/2021 2:00 PM  To reach On-call, see care teams to locate the attending and reach out via [email or web address]. Between 7PM-7AM, please contact night-coverage If you still have difficulty reaching the attending provider, please page the [facility] (Director on Call) for [facility] on amion for assistance.

## 2021-03-13 NOTE — Progress Notes (Signed)
Physical Therapy Treatment Patient Details Name: Joseph Ortega MRN: [id number] DOB: Sep 25, 1955 Today's Date: 03/13/2021   History of Present Illness Pt is a 65 y.o. male admitted 12/21 with dx of SAH. He presented to the ED via EMS from work with c/o L side weakness. 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 This hemorrhage has undergone subarachnoid extension and there is subarachnoid blood in the sulci of the right sylvian fissure and posterior frontal region.  Pt also found to have shingles L anterior chest. PMH: DM2, HTN    PT Comments    Pt very motivated to get up and OOB today. Max A needed to come to EOB, pt able to assist more when exiting to L side, worked on using RLE to push LLE. Pt stood to stedy with max A +2, able to maintain standing for bouts of 10-20 secs with L lean but not complete buckling of L knee. From perched position on stedy flaps, was able to stand with mod A +2. Pt able to have large BM when seated on [facility], RN notified. Pt then transferred [facility] to recliner. Pt demonstrating excellent motivated and making good progress, continues to recommend CIR at d/c for highest level of return to independence. PT will continue to follow.    Recommendations for follow up therapy are one component of a multi-disciplinary discharge planning process, led by the attending physician.  Recommendations may be updated based on patient status, additional functional criteria and insurance authorization.  Follow Up Recommendations  Acute inpatient rehab (3hours/day)     Assistance Recommended at Discharge Frequent or constant Supervision/Assistance  Equipment Recommendations  Other (comment) (TBD)    Recommendations for Other Services Rehab consult     Precautions / Restrictions Precautions Precautions: Fall;Other (comment) Precaution Comments: L weakness Restrictions Weight Bearing Restrictions:  No Other Position/Activity Restrictions: Baseline L visual field impairment     Mobility  Bed Mobility Overal bed mobility: Needs Assistance Bed Mobility: Supine to Sit     Supine to sit: Max assist;HOB elevated     General bed mobility comments: cues for using RLE to assist LLE, pt was able to pull on rail with RUE when exiting bed to L, max A needed but approaching mod    Transfers Overall transfer level: Needs assistance Equipment used: Ambulation equipment used Transfers: Sit to/from Stand;Bed to chair/wheelchair/BSC Sit to Stand: Max assist;+2 physical assistance;+2 safety/equipment;From elevated surface;Mod assist           General transfer comment: pt stood from elevated bed with max A +2, from flaps od stedy 2x with mod A +2, and from [facility] with max A +2. Assist needed to power up and counteract L lean, vc's for use of R side Transfer via Lift Equipment: Stedy  Ambulation/Gait               General Gait Details: unable   Stairs             Wheelchair Mobility    Modified Rankin (Stroke Patients Only) Modified Rankin (Stroke Patients Only) Pre-Morbid Rankin Score: No symptoms Modified Rankin: Severe disability     Balance Overall balance assessment: Needs assistance Sitting-balance support: Feet supported;Single extremity supported Sitting balance-Leahy Scale: Fair Sitting balance - Comments: able to sit EOB without LOB but unable to accept challenge to L Postural control: Left lateral lean Standing balance support: Bilateral upper extremity supported;During functional activity;Reliant on assistive device for balance Standing balance-Leahy  Scale: Zero Standing balance comment: able to stand 10-20 secs at a time with mod A, strong L lean                            Cognition Arousal/Alertness: Awake/alert Behavior During Therapy: WFL for tasks assessed/performed Overall Cognitive Status: History of cognitive impairments - at  baseline Area of Impairment: Problem solving                             Problem Solving: Slow processing General Comments: follows basic instructional commands correctly and with good timing        Exercises General Exercises - Lower Extremity Ankle Circles/Pumps: AROM;Both;10 reps;Seated;Limitations Ankle Circles/Pumps Limitations: L ankle with 1/5 df but pt instructed to watch ankle and continue to work on [employer]: AROM;Left;10 reps;Seated Long Arc Quad: AROM;Left;10 reps;Seated;Limitations Long [employer] Limitations: can only complete partial ROM due to weakness    General Comments General comments (skin integrity, edema, etc.): HR 77bpm, Spo2 93-94% on RA      Pertinent Vitals/Pain Pain Assessment: Faces Faces Pain Scale: Hurts little more Pain Location: L chest wall (shingles) and L elbow Pain Descriptors / Indicators: Constant;Discomfort Pain Intervention(s): Limited activity within patient's tolerance;Monitored during session;Repositioned    Home Living                          Prior Function            PT Goals (current goals can now be found in the care plan section) Acute Rehab PT Goals Patient Stated Goal: regain independence PT Goal Formulation: With patient Time For Goal Achievement: 03/24/21 Potential to Achieve Goals: Good Progress towards PT goals: Progressing toward goals    Frequency    Min 4X/week      PT Plan Current plan remains appropriate    Co-evaluation PT/OT/SLP Co-Evaluation/Treatment: Yes Reason for Co-Treatment: Complexity of the patient's impairments (multi-system involvement);For patient/therapist safety PT goals addressed during session: Mobility/safety with mobility;Balance;Proper use of DME;Strengthening/ROM OT goals addressed during session: ADL's and self-care      AM-PAC PT "6 Clicks" Mobility   Outcome Measure  Help needed turning from your back to your side while in a flat bed without  using bedrails?: A Lot Help needed moving from lying on your back to sitting on the side of a flat bed without using bedrails?: A Lot Help needed moving to and from a bed to a chair (including a wheelchair)?: Total Help needed standing up from a chair using your arms (e.g., wheelchair or bedside chair)?: Total Help needed to walk in hospital room?: Total Help needed climbing 3-5 steps with a railing? : Total 6 Click Score: 8    End of Session Equipment Utilized During Treatment: Gait belt Activity Tolerance: Patient tolerated treatment well Patient left: with call bell/phone within reach;in chair;with chair alarm set Nurse Communication: Mobility status;Need for lift equipment;Other (comment) (pt left on RA, need for bari [facility]) PT Visit Diagnosis: Other abnormalities of gait and mobility (R26.89);Hemiplegia and hemiparesis Hemiplegia - Right/Left: Left Hemiplegia - dominant/non-dominant: Non-dominant Hemiplegia - caused by: Nontraumatic SAH     Time: 8372-9021 PT Time Calculation (min) (ACUTE ONLY): 34 min  Charges:  $Therapeutic Activity: 8-22 mins                     Lyanne Co, PT  Acute Rehab Services  Pager [phone] Office [phone]    Lawana Chambers Chariti Havel 03/13/2021, 11:44 AM

## 2021-03-13 NOTE — Progress Notes (Signed)
STROKE TEAM PROGRESS NOTE   INTERVAL HISTORY No family at the bedside. Pt is in CPAP for evening sleeping due to OSA. He is AAO x 3. Still has left hemianopia and left sided weakness. CTA head showed right P3 calcified plaque which was not there 2 days prior. Will need to repeat CTA neck for further evaluation.   Vitals:   03/13/21 0836 03/13/21 1205 03/13/21 1622 03/13/21 1700  BP:  129/65 140/70   Pulse:  64 65 70  Resp:  20 17   Temp: 98.6 F (37 C) 99.2 F (37.3 C) 98.8 F (37.1 C)   TempSrc: Oral Oral Axillary   SpO2:  92% 96%   Weight:       CBC:  Recent Labs  Lab 03/08/21 0900 03/09/21 0130 03/12/21 0622 03/13/21 0438  WBC 7.3   < > 5.7 5.5  NEUTROABS 4.7  --   --   --   HGB 14.3   < > 13.1 13.9  HCT 41.0   < > 39.3 40.9  MCV 93.4   < > 95.9 94.7  PLT 221   < > 144* 152   < > = values in this interval not displayed.   Basic Metabolic Panel:  Recent Labs  Lab 03/08/21 1528 03/09/21 0130 03/12/21 0622 03/13/21 0438  NA  --    < > 137 139  K  --    < > 3.9 4.1  CL  --    < > 104 105  CO2  --    < > 26 25  GLUCOSE  --    < > 233* 204*  BUN  --    < > 17 15  CREATININE  --    < > 0.92 0.80  CALCIUM  --    < > 8.9 8.7*  MG 2.2  --   --   --    < > = values in this interval not displayed.   Lipid Panel:  Recent Labs  Lab 03/10/21 0326  CHOL 156  TRIG 70   72  HDL 36*  CHOLHDL 4.3  VLDL 14  LDLCALC 106*   HgbA1c:  Recent Labs  Lab 03/10/21 0326  HGBA1C 11.1*   Urine Drug Screen:  Recent Labs  Lab 03/08/21 1003  LABOPIA NONE DETECTED  COCAINSCRNUR NONE DETECTED  LABBENZ NONE DETECTED  AMPHETMU NONE DETECTED  THCU NONE DETECTED  LABBARB NONE DETECTED    Alcohol Level No results for input(s): ETH in the last 168 hours.  IMAGING past 24 hours No results found.  PHYSICAL EXAM General: Alert, well-developed male in no acute distress.  NEURO:  Mental Status: AA&Ox3  Speech/Language: speech is without dysarthria or aphasia.  Naming,  repetition, fluency, and comprehension intact.  Cranial Nerves:  II: PERRL. Left hemianopsia III, IV, VI: EOMI. Eyelids elevate symmetrically.  V: Sensation is intact to light touch and symmetrical to face.  VII: Smile is symmetrical.  VIII: hearing intact to voice. IX, X:Phonation is normal.  XII: tongue is midline without fasciculations. Motor: 5/5 strength to RUE and RLE, 0/5 to LUE and 2/5 to LLE Tone: is normal and bulk is normal Sensation- Intact to light touch bilaterally. Extinction absent to light touch to DSS.    Coordination: No drift.  Gait- deferred   ASSESSMENT/PLAN Joseph Ortega is a 65 y.o. male with history of Type 2 diabetes, hypertension, hypothyroidism. Patient states on 12/21 at 2 AM he woke up and felt normal.  At 7:30  AM patient noticed sudden onset left-sided weakness.  Symptoms gradually improved.  Still having weakness in left arm.  Denies speech concerns.  Patient also has rash under left chest  appears to be shingles.  CT head shows 4 mm intraparenchymal hematoma in right posterior frontal lobe with subarachnoid penetration.  Left sided deficits and mental status stable since initial evaluation. Neurosurgery consulted.  SBP goal <160.  Cleviprex has been tapered off and patient can be transferred out of the ICU.  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  Due to increased likelihood of embolic etiology of strokes, patient will need loop recorder before discharge.  Repeat head CT 12/24 is stable. Aspirin 81mg  started.  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 However, cardioembolic source can not be ruled out   Stroke - new right PCA infarct, etiology could be large vessel atherosclerosis given calcified plaque at right P3.  Code Stroke CT head right frontal SAH with 20mm IPH CTA  head & neck 1-17mm infundibulum vs aneurysm arising from paraclinoid L ICA 12/22- Cerebral angio -No angio evidence of of DAVF,AV shunting,AVM ,aneurysms or of dissections or vasculitis. Right VA origin calcified plaque, left VA origin moderate stenosis MRI localized acute infarction at the right posterior frontal vertex with a 6 mm associated IPH and SAH in the right sylvian fissure and posterior frontal region Repeat MRI 12/23 new acute infarction in right occipital lobe and enlargement of posterior frontal infarction, stable HT and SAH CTA head repeat - Focal calcification within the right PCA distal P3 segment, likely calcified thromboembolism. CTA neck repeat pending 2D echo EF 45-50% Recommend 30 day cardiac event monitoring to rule out afib as outpt  A1C- 11.1 LDL- 106 VTE prophylaxis - lovenox No antithrombotic prior to admission, now on ASA 81.  Therapy recommendations: CIR Disposition: Pending  Shingles Left chest wall rash - pain much improved CCM and ID management valtrex 1g TID for 2-3 weeks AAO x 3, afebrile, WBC normal, as well as neuro exam not consistent with VZV vasculitis or encephalitis  Hypertension Home meds: Valsartan 320mg , metoprolol tartrate 50 mg Stable BP goal < 160 Now on coreg and avapro Long-term BP goal normotensive  Diabetes type II uncontrolled Home meds: Levemir HgbA1c 11.1, goal < 7.0 CBGs Diabetes coordinator consult SSI  Now on levemir Close PCP follow up for better DM control  Other Stroke Risk Factors Obesity, Body mass index is 37.72 kg/m., BMI >/= 30 associated with increased stroke risk, recommend weight loss, diet and exercise as appropriate  Obstructive sleep apnea - Home BiPAP at night and with naps  Other Active Problems   Hospital day # 5  I spent  35 minutes in total face-to-face time with the patient, more than 50% of which was spent in counseling and coordination of care, reviewing test results, images and medication,  and discussing the diagnosis, treatment plan and potential prognosis. This patient's care requiresreview of multiple databases, neurological assessment, discussion with family, other specialists and medical decision making of high complexity.  1/24, MD PhD Stroke Neurology 03/13/2021 6:18 PM   To contact Stroke Continuity provider, please refer to [facility] Plan. After hours, contact General Neurology

## 2021-03-13 NOTE — Progress Notes (Signed)
Occupational Therapy Treatment Patient Details Name: Joseph Ortega MRN: [id number] DOB: 01-23-1956 Today's Date: 03/13/2021   History of present illness Pt is a 65 y.o. male admitted 12/21 with dx of SAH. He presented to the ED via EMS from work with c/o L side weakness. 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 This hemorrhage has undergone subarachnoid extension and there is subarachnoid blood in the sulci of the right sylvian fissure and posterior frontal region.  Pt also found to have shingles L anterior chest. PMH: DM2, HTN   OT comments  Patient received in bed and eager to participate in PT/OT session. Patient educated on bed mobility to get to EOB with LLE moving towards EOB and assisted with RLE. Patient was able to maintain balance with RUE assist while sitting on EOB. Patient stood in [place] with mod assist +2 and transferred to 3n1 commode. Patient stood in [place] for toilet hygiene and was transferred to recliner with LUE positioned.  Patient making good gains with OT treatment and could benefit from inpatient rehab. Acute OT to continue to follow.    Recommendations for follow up therapy are one component of a multi-disciplinary discharge planning process, led by the attending physician.  Recommendations may be updated based on patient status, additional functional criteria and insurance authorization.    Follow Up Recommendations  Acute inpatient rehab (3hours/day)    Assistance Recommended at Discharge Frequent or constant Supervision/Assistance  Equipment Recommendations  Wheelchair (measurements OT);Wheelchair cushion (measurements OT)    Recommendations for Other Services      Precautions / Restrictions Precautions Precautions: Fall;Other (comment) Precaution Comments: L weakness Restrictions Weight Bearing Restrictions: No       Mobility Bed Mobility Overal bed mobility: Needs  Assistance Bed Mobility: Supine to Sit     Supine to sit: Mod assist;HOB elevated     General bed mobility comments: instruction to get LEs off side of bed and push with trunk    Transfers Overall transfer level: Needs assistance Equipment used: Ambulation equipment used Transfers: Sit to/from Stand;Bed to chair/wheelchair/BSC Sit to Stand: Mod assist;+2 physical assistance           General transfer comment: patient required cues for technique and body mechanics with mod assist +2 to power up Transfer via Lift Equipment: Stedy   Balance Overall balance assessment: Needs assistance Sitting-balance support: Feet supported;Single extremity supported Sitting balance-Leahy Scale: Fair Sitting balance - Comments: used foot of bed to assist with balance using RUE   Standing balance support: Bilateral upper extremity supported;Reliant on assistive device for balance Standing balance-Leahy Scale: Poor Standing balance comment: reliant on stedy for balance                           ADL either performed or assessed with clinical judgement   ADL Overall ADL's : Needs assistance/impaired                         Toilet Transfer: Moderate assistance;+2 for physical assistance;+2 for safety/equipment (used stedy) Toilet Transfer Details (indicate cue type and reason): mod assist +2 to use stedy for transfer to [facility] Toileting- Clothing Manipulation and Hygiene: Total assistance;+2 for physical assistance;+2 for safety/equipment;Moderate assistance Toileting - Clothing Manipulation Details (indicate cue type and reason): stood in stedy with assistance of two for standing and one for toilety hygiene  General ADL Comments: patient performed transfer to [facility] using stedy with total assist for toilet hygiene    Extremity/Trunk Assessment Upper Extremity Assessment LUE Deficits / Details: 0/5 hand/elbow, 1/5 shoulder, edema noted at hand LUE Sensation: decreased  light touch LUE Coordination: decreased fine motor;decreased gross motor            Vision       Perception     Praxis      Cognition Arousal/Alertness: Awake/alert Behavior During Therapy: WFL for tasks assessed/performed Overall Cognitive Status: History of cognitive impairments - at baseline Area of Impairment: Problem solving                             Problem Solving: Slow processing General Comments: required frequent cues to attend to left and prevent left lateral leaning          Exercises     Shoulder Instructions       General Comments      Pertinent Vitals/ Pain       Pain Assessment: Faces Faces Pain Scale: Hurts little more Pain Location: left elbow Pain Descriptors / Indicators: Guarding;Grimacing;Discomfort Pain Intervention(s): Limited activity within patient's tolerance  Home Living                                          Prior Functioning/Environment              Frequency  Min 2X/week        Progress Toward Goals  OT Goals(current goals can now be found in the care plan section)  Progress towards OT goals: Progressing toward goals  Acute Rehab OT Goals Patient Stated Goal: get better OT Goal Formulation: With patient Time For Goal Achievement: 03/24/21 Potential to Achieve Goals: Good ADL Goals Pt Will Perform Grooming: with supervision;sitting Pt Will Perform Upper Body Bathing: with min assist;sitting Pt Will Perform Upper Body Dressing: with min assist;sitting Pt Will Transfer to Toilet: with mod assist;squat pivot transfer;bedside commode Pt Will Perform Toileting - Clothing Manipulation and hygiene: with mod assist;sit to/from stand Pt/caregiver will Perform Home Exercise Program: Increased ROM;Increased strength;Left upper extremity;With Supervision  Plan Discharge plan remains appropriate    Co-evaluation    PT/OT/SLP Co-Evaluation/Treatment: Yes Reason for Co-Treatment:  Complexity of the patient's impairments (multi-system involvement);For patient/therapist safety;To address functional/ADL transfers   OT goals addressed during session: ADL's and self-care      AM-PAC OT "6 Clicks" Daily Activity     Outcome Measure   Help from another person eating meals?: A Little Help from another person taking care of personal grooming?: A Little Help from another person toileting, which includes using toliet, bedpan, or urinal?: A Lot Help from another person bathing (including washing, rinsing, drying)?: A Lot Help from another person to put on and taking off regular upper body clothing?: A Lot Help from another person to put on and taking off regular lower body clothing?: Total 6 Click Score: 13    End of Session Equipment Utilized During Treatment: Gait belt;Other (comment) Antony Salmon)  OT Visit Diagnosis: Unsteadiness on feet (R26.81);Muscle weakness (generalized) (M62.81);Hemiplegia and hemiparesis Hemiplegia - Right/Left: Left Hemiplegia - dominant/non-dominant: Non-Dominant Hemiplegia - caused by: Cerebral infarction;Nontraumatic SAH   Activity Tolerance Patient tolerated treatment well   Patient Left in chair;with call bell/phone within reach;with chair alarm set   Nurse  Communication Mobility status;Need for lift equipment        Time: 5701-7793 OT Time Calculation (min): 34 min  Charges: OT General Charges $OT Visit: 1 Visit OT Treatments $Self Care/Home Management : 8-22 mins  Alfonse Flavors, OTA Acute Rehabilitation Services  Pager [phone] Office [phone]   Dewain Penning 03/13/2021, 11:42 AM

## 2021-03-13 NOTE — Progress Notes (Addendum)
Patient is desperately wanting to use the bedside commode but he loses complete balance on his left side. I sat him up on the side of the bed but he had to hold himself up with right arm. He said he has not worked with physical therapy yet but he really needs it before we transfer him from the bed if that is even possible.

## 2021-03-13 NOTE — Progress Notes (Addendum)
Patient resting comfortably on BIPAP. Secured mask. Added 2L O2 bleed in due to Spo2 89-90%

## 2021-03-14 ENCOUNTER — Inpatient Hospital Stay (HOSPITAL_COMMUNITY): Payer: BC Managed Care – PPO

## 2021-03-14 DIAGNOSIS — I63311 Cerebral infarction due to thrombosis of right middle cerebral artery: Secondary | ICD-10-CM

## 2021-03-14 DIAGNOSIS — G4733 Obstructive sleep apnea (adult) (pediatric): Secondary | ICD-10-CM

## 2021-03-14 DIAGNOSIS — B028 Zoster with other complications: Secondary | ICD-10-CM

## 2021-03-14 DIAGNOSIS — I63431 Cerebral infarction due to embolism of right posterior cerebral artery: Secondary | ICD-10-CM

## 2021-03-14 LAB — GLUCOSE, CAPILLARY
Glucose-Capillary: 217 mg/dL — ABNORMAL HIGH (ref 70–99)
Glucose-Capillary: 178 mg/dL — ABNORMAL HIGH (ref 70–99)
Glucose-Capillary: 194 mg/dL — ABNORMAL HIGH (ref 70–99)
Glucose-Capillary: 206 mg/dL — ABNORMAL HIGH (ref 70–99)

## 2021-03-14 MED ORDER — IOHEXOL 350 MG/ML SOLN
100.0000 mL | Freq: Once | INTRAVENOUS | Status: AC | PRN
  Administered 2021-03-14: 02:00:00 100 mL via INTRAVENOUS

## 2021-03-14 NOTE — Progress Notes (Signed)
Inpatient Rehab Admissions Coordinator:   Consult received and chart reviewed.  I spoke to patient's spouse on the phone.  She is able to care for herself at baseline, and could likely assist with ADLs at discharge, but would not be able to provide physical assist for mobility as she uses a RW and a w/c for her mobility at baseline. I explained that [employer]BS would not authorize SNF admission following CIR admission, and with decreased availability of physical assist, would likely be better to pursue SNF at this time.  She is in agreement.  I will let TOC team know to begin SNF search.  CIR will sign off at this time.   Estill Dooms, PT, DPT Admissions Coordinator [phone] 03/14/21  2:34 PM

## 2021-03-14 NOTE — Progress Notes (Signed)
Inpatient Diabetes Program Recommendations  AACE/ADA: New Consensus Statement on Inpatient Glycemic Control (2015)  Target Ranges:  Prepandial:   less than 140 mg/dL      Peak postprandial:   less than 180 mg/dL (1-2 hours)      Critically ill patients:  140 - 180 mg/dL   Lab Results  Component Value Date   GLUCAP 178 (H) 03/14/2021   HGBA1C 11.1 (H) 03/10/2021    Review of Glycemic Control  Latest Reference Range & Units 03/13/21 08:16 03/13/21 12:05 03/13/21 16:19 03/13/21 22:58 03/14/21 08:06 03/14/21 12:13  Glucose-Capillary 70 - 99 mg/dL 017 (H) 494 (H) 496 (H) 193 (H) 217 (H) 178 (H)  (H): Data is abnormally high Diabetes history: Type 2 DM Outpatient Diabetes medications: Levemir 40 units QD, Novolog 10 units TID, Trulicity 3 mg Qwk, Farxiga 10 mg QD Current orders for Inpatient glycemic control: Levemir 27 units bid, Novolog 10 units tid meal coverage, Novolog 0-15 units Q4H   Inpatient Diabetes Program Recommendations:   Please consider: -Increase Levemir to 30 units bid  Thank you, Darel Hong E. Christapher Gillian, RN, MSN, CDE  Diabetes Coordinator Inpatient Glycemic Control Team Team Pager [phone] (8am-5pm) 03/14/2021 12:50 PM

## 2021-03-14 NOTE — Progress Notes (Signed)
STROKE TEAM PROGRESS NOTE   INTERVAL HISTORY No family at the bedside. Pt is sitting in chair, no acute event overnight. CTA neck done, unchanged from prior. Left upper quadrantanopia improved. Still has lower quadrantanopia.    Vitals:   03/14/21 0007 03/14/21 0307 03/14/21 0807 03/14/21 0902  BP: 140/70 (!) 132/58 (!) 150/74 (!) 150/74  Pulse: 67 65 62 61  Resp:  11 17 16   Temp: 99 F (37.2 C) 97.9 F (36.6 C) 98.1 F (36.7 C)   TempSrc: Oral Axillary Oral   SpO2: 93% 93% 94% 95%  Weight:       CBC:  Recent Labs  Lab 03/08/21 0900 03/09/21 0130 03/12/21 0622 03/13/21 0438  WBC 7.3   < > 5.7 5.5  NEUTROABS 4.7  --   --   --   HGB 14.3   < > 13.1 13.9  HCT 41.0   < > 39.3 40.9  MCV 93.4   < > 95.9 94.7  PLT 221   < > 144* 152   < > = values in this interval not displayed.   Basic Metabolic Panel:  Recent Labs  Lab 03/08/21 1528 03/09/21 0130 03/12/21 0622 03/13/21 0438  NA  --    < > 137 139  K  --    < > 3.9 4.1  CL  --    < > 104 105  CO2  --    < > 26 25  GLUCOSE  --    < > 233* 204*  BUN  --    < > 17 15  CREATININE  --    < > 0.92 0.80  CALCIUM  --    < > 8.9 8.7*  MG 2.2  --   --   --    < > = values in this interval not displayed.   Lipid Panel:  Recent Labs  Lab 03/10/21 0326  CHOL 156  TRIG 70   72  HDL 36*  CHOLHDL 4.3  VLDL 14  LDLCALC 106*   HgbA1c:  Recent Labs  Lab 03/10/21 0326  HGBA1C 11.1*   Urine Drug Screen:  Recent Labs  Lab 03/08/21 1003  LABOPIA NONE DETECTED  COCAINSCRNUR NONE DETECTED  LABBENZ NONE DETECTED  AMPHETMU NONE DETECTED  THCU NONE DETECTED  LABBARB NONE DETECTED    Alcohol Level No results for input(s): ETH in the last 168 hours.  IMAGING past 24 hours CT ANGIO NECK W OR WO CONTRAST  Result Date: 03/14/2021 CLINICAL DATA:  Acute neurologic deficit EXAM: CT ANGIOGRAPHY NECK TECHNIQUE: Multidetector CT imaging of the neck was performed using the standard protocol during bolus administration of  intravenous contrast. Multiplanar CT image reconstructions and MIPs were obtained to evaluate the vascular anatomy. Carotid stenosis measurements (when applicable) are obtained utilizing NASCET criteria, using the distal internal carotid diameter as the denominator. CONTRAST:  171mL OMNIPAQUE IOHEXOL 350 MG/ML SOLN COMPARISON:  03/08/2021 FINDINGS: Aortic arch: Standard branching. Imaged portion shows no evidence of aneurysm or dissection. No significant stenosis of the major arch vessel origins. Right carotid system: No evidence of dissection, stenosis (50% or greater) or occlusion. Left carotid system: No evidence of dissection, stenosis (50% or greater) or occlusion. Vertebral arteries: Codominant. No evidence of dissection, stenosis (50% or greater) or occlusion. Skeleton: Multifocal sclerotic change within the vertebral column is unchanged since 03/08/2021. 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 Upper chest: Negative IMPRESSION: 1. No dissection, occlusion or hemodynamically significant  stenosis of the carotid or vertebral arteries. 2. Multifocal sclerotic change within the vertebral column, unchanged since 03/08/2021, consistent with treated metastatic disease. Electronically Signed   By: Ulyses Jarred M.D.   On: 03/14/2021 02:29    PHYSICAL EXAM General: Alert, well-developed male in no acute distress.  NEURO:  Mental Status: AA&Ox3  Speech/Language: speech is without dysarthria or aphasia.  Naming, repetition, fluency, and comprehension intact.  Cranial Nerves:  II: PERRL. Left lower quadrantanopsia III, IV, VI: EOMI. Eyelids elevate symmetrically.  V: Sensation is intact to light touch and symmetrical to face.  VII: Smile is symmetrical.  VIII: hearing intact to voice. IX, X:Phonation is normal.  XII: tongue is midline without fasciculations. Motor: 5/5 strength to RUE and RLE, 0/5 to LUE and 2/5 to LLE Tone: is normal and bulk is normal Sensation- Intact  to light touch bilaterally. Extinction absent to light touch to DSS.    Coordination: No drift.  Gait- deferred   ASSESSMENT/PLAN [name] is a 65 y.o. male with history of Type 2 diabetes, hypertension, hypothyroidism. Patient states on 12/21 at 2 AM he woke up and felt normal.  At 7:30 AM patient noticed sudden onset left-sided weakness.  Symptoms gradually improved.  Still having weakness in left arm.  Denies speech concerns.  Patient also has rash under left chest  appears to be shingles.  CT head shows 4 mm intraparenchymal hematoma in right posterior frontal lobe with subarachnoid penetration.  Left sided deficits and mental status stable since initial evaluation. Neurosurgery consulted.  SBP goal <160.  Cleviprex has been tapered off and patient can be transferred out of the ICU.  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  Due to increased likelihood of embolic etiology of strokes, patient will need loop recorder before discharge.  Repeat head CT 12/24 is stable. Aspirin 81mg  started.  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 However, cardioembolic source can not be ruled out   Stroke - new right PCA infarct, etiology could be large vessel atherosclerosis given embolic calcified plaque at right P3 from right VA origin or subclavian artery.  Code Stroke CT head right frontal SAH with 11mm IPH CTA head & neck 1-67mm infundibulum vs aneurysm arising from paraclinoid L ICA 12/22- Cerebral angio -No angio evidence of of DAVF,AV shunting,AVM ,aneurysms or of dissections or vasculitis. Right subclavian and VA origin calcified plaque, left VA origin moderate stenosis MRI localized acute infarction at the right posterior frontal vertex with a 6 mm associated IPH and SAH in the right sylvian fissure and posterior  frontal region Repeat MRI 12/23 new acute infarction in right occipital lobe and enlargement of posterior frontal infarction, stable HT and SAH CTA head repeat - Focal calcification within the right PCA distal P3 segment, likely calcified thromboembolism. CTA neck repeat unchanged right subclavian and VA origin calcified plaque 2D echo EF 45-50% Recommend 30 day cardiac event monitoring to rule out afib as outpt  A1C- 11.1 LDL- 106 VTE prophylaxis - lovenox No antithrombotic prior to admission, now on ASA 81. No DAPT given HT and SAH Therapy recommendations: CIR Disposition: Pending  Shingles Left chest wall rash - pain much improved CCM and ID management valtrex 1g TID for 2-3 weeks AAO x 3, afebrile, WBC normal, as well as neuro exam not consistent with VZV vasculitis or encephalitis  Hypertension Home meds: Valsartan 320mg , metoprolol tartrate 50 mg Stable BP goal < 160 Now on coreg and avapro Long-term BP goal normotensive  Diabetes type II uncontrolled Home meds: Levemir HgbA1c 11.1, goal < 7.0 CBGs Diabetes coordinator consult SSI  Now on levemir Close PCP follow up for better DM control  Other Stroke Risk Factors Obesity, Body mass index is 37.72 kg/m., BMI >/= 30 associated with increased stroke risk, recommend weight loss, diet and exercise as appropriate  Obstructive sleep apnea - Home BiPAP at night and with naps  Other Active Problems   Hospital day # 6  Neurology will sign off. Please call with questions. Pt will follow up with stroke clinic NP at [facility] in about 4 weeks. Thanks for the consult.   [facility], MD PhD Stroke Neurology 03/14/2021 12:52 PM   To contact Stroke Continuity provider, please refer to 03/16/2021. After hours, contact General Neurology

## 2021-03-14 NOTE — Progress Notes (Signed)
Pt transported to CT ?

## 2021-03-14 NOTE — Progress Notes (Signed)
Inpatient Rehab Admissions Coordinator:   Per therapy recommendations patient was screened for CIR candidacy by Megan Salon, MS, CCC-SLP. At this time, Pt. Appears to be a a potential candidate for CIR. I will place   order for rehab consult per protocol for full assessment. Not Pt. Is likely to require heavy assistance even after a brief stay on CIR. Family will need to be able to provide 24/7 physical support at discharge in order to be considered a candidate as insurance will not approve SNF after CIR.Please contact me any with questions.  Megan Salon, MS, CCC-SLP Rehab Admissions Coordinator  [phone] (celll) [phone] (office)

## 2021-03-14 NOTE — Progress Notes (Signed)
[facility] Progress Note  Patient: Joseph Ortega    NGE:[id number]  DOA: 03/08/2021    Date of Service: the patient was seen and examined on 03/14/2021  Brief hospital course: 65 year old male with past medical history of hypertension & diabetes presented to the emergency room at [facility] on 12/21 with left-sided weakness and CT scan of head noted 4 mm intraparenchymal hematoma in the right posterior frontal lobe with subarachnoid penetration.  Patient started on Keppra and Cleviprex.  Also incidentally noted to have rash on left side of chest consistent with shingles.  Transferred to [facility] for neuro ICU.  Follow-up MRI reveals new right occipital lobe infarction and enlargement of posterior frontal infarction.  Cleviprex stopped.  Concerns for embolic etiology and so patient started on loop recorder.  Seen by PT and OT initially recommended inpatient rehab, but there may be insurance issues.  Therefore patient is can now looking at skilled nursing.  Assessment and Plan: Cardiovascular and Mediastinum Essential hypertension Assessment & Plan On valsartan and metoprolol.  Initially allowed for permissive hypertension.  Blood pressures stable, although had some slightly low blood pressures last night  Respiratory OSA (obstructive sleep apnea) Assessment & Plan Continue nightly BiPAP  Endocrine Hypothyroidism Assessment & Plan Elevated TSH, but normal T4 and likely in the context of other acute medical problems.  Would not make any adjustments in Synthroid at this time and recheck labs 6 weeks after discharge.  Nervous and Auditory * SAH (subarachnoid hemorrhage) [facility]) Assessment & Plan Appreciate neurology help.  Patient with right frontal localized intraparenchymal hemorrhage and underlying hemorrhagic infarct with etiology indeterminant possibly secondary to venous angioma.  Repeat MRI on 12/23 notes new acute infarction of right occipital lobe and enlargement  of posterior frontal infarction.  On Keppra 500 twice a day.  Looking at skilled nursing.  LDL at 106.  A1c 11.1.  Not on any antithrombotics currently.  Seen by neurology and follow-up with signed off.  Repeat CVA felt to be possibly atherosclerosis versus embolic.  Patient will need 30-day event monitor.  Other Redness of both eyes Assessment & Plan No pain or loss of vision.  Started eyedrops on 12/25.  Both eyes have since improved  Tobacco abuse Assessment & Plan Counseled.  Nicotine patch.  Depression Assessment & Plan Home medicines restarted  Herpes zoster with complication Assessment & Plan Left anterior chest around T10 dermatome.  On Valtrex 3 times daily for 2 to 3 weeks.  Morbid obesity (HCC) Assessment & Plan Meets criteria BMI greater than 35+ comorbidities of diabetes and hypertension and CVA       Body mass index is 37.72 kg/m.        Consultants: Neurology Critical care Neurosurgery Interventional radiology  Procedures: None  Antimicrobials: None  Code Status: Full code   Subjective: Patient feeling better today.  Mild left elbow pain  Objective: Vital signs were reviewed and unremarkable. Vitals:   03/14/21 0902 03/14/21 1340  BP: (!) 150/74 135/71  Pulse: 61 65  Resp: 16 19  Temp:  98.6 F (37 C)  SpO2: 95%     Intake/Output Summary (Last 24 hours) at 03/14/2021 1530 Last data filed at 03/14/2021 0034 Gross per 24 hour  Intake --  Output 200 ml  Net -200 ml    Filed Weights   03/11/21 0340 03/12/21 0500 03/13/21 0500  Weight: 131.1 kg 132.6 kg 129.7 kg   Body mass index is 37.72 kg/m.  Exam:  General: Alert and oriented x3,  no acute distress HEENT: Normocephalic and atraumatic, mucous membranes are moist.  Left eye with mild injection of sclera Cardiovascular: Regular rate and rhythm, S1-S2 Respiratory: Clear to auscultation bilaterally Abdomen: Soft, nontender, nondistended, positive bowel sounds Musculoskeletal:  No clubbing or cyanosis or edema.  See below for left-sided strength.   Skin: No skin breaks, tears or lesions Psychiatry: Appropriate, no evidence of psychoses Neurology: Right-sided hemiplegia left-sided hemiplegia complete on left upper extremity with some minimal strength on left lower extremity  Data Reviewed: Repeat CTA of head notes edema as above  Disposition:  Status is: Inpatient  Remains inpatient appropriate because: Needs skilled nursing   Family Communication: Left message for wife  DVT Prophylaxis: enoxaparin (LOVENOX) injection 40 mg Start: 03/10/21 1000 Place and maintain sequential compression device Start: 03/08/21 1317 SCDs Start: 03/08/21 1303    Author: Hollice Espy ,MD 03/14/2021 3:30 PM  To reach On-call, see care teams to locate the attending and reach out via [email or web address]. Between 7PM-7AM, please contact night-coverage If you still have difficulty reaching the attending provider, please page the [facility] (Director on Call) for [facility] on amion for assistance.

## 2021-03-14 NOTE — Progress Notes (Signed)
Patient resting comfortably on BIPAP (dreamstation) 16/5, 21%.

## 2021-03-14 NOTE — Progress Notes (Signed)
Physical Therapy Treatment Patient Details Name: Joseph Ortega MRN: [id number] DOB: 1955-07-28 Today's Date: 03/14/2021   History of Present Illness Pt is a 65 y.o. male admitted 12/21 with dx of SAH. He presented to the ED via EMS from work with c/o L side weakness. 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 This hemorrhage has undergone subarachnoid extension and there is subarachnoid blood in the sulci of the right sylvian fissure and posterior frontal region. Follow up MRI also showed new R occipital infarct and enlargement of frontal infarct.   Pt also found to have shingles L anterior chest. PMH: DM2, HTN    PT Comments    Pt received in bed speaking on phone with wife. Pt mentions that he has upper mid back pain since CT last night. Hot pack applied end of session. Pt came to EOB with mod A to LLE and for wt shifting to R to come up. Pt scooted to R from bed into recliner with max A +2. With stabilization of L foot on floor was able to pusj through LLE minimally during transfer. Pt stood from recliner to stedy with max A +2 and pregait activities were initiated in standing. Continue to strongly recommend CIR at d/c. Pt and wife agreeable. PT will continue to follow.    Recommendations for follow up therapy are one component of a multi-disciplinary discharge planning process, led by the attending physician.  Recommendations may be updated based on patient status, additional functional criteria and insurance authorization.  Follow Up Recommendations  Acute inpatient rehab (3hours/day)     Assistance Recommended at Discharge Frequent or constant Supervision/Assistance  Equipment Recommendations  Other (comment) (TBD)    Recommendations for Other Services Rehab consult     Precautions / Restrictions Precautions Precautions: Fall Precaution Comments: L weakness Restrictions Weight Bearing  Restrictions: No Other Position/Activity Restrictions: Baseline L visual field impairment. Shingles L chest     Mobility  Bed Mobility Overal bed mobility: Needs Assistance Bed Mobility: Supine to Sit     Supine to sit: Mod assist;HOB elevated     General bed mobility comments: vc's for breathing throughout, pt hold breath otherwise. Mod A for LE's off EOB with RLE assisting L. Mod A for elevtion of trunk into sitting    Transfers Overall transfer level: Needs assistance Equipment used: Ambulation equipment used;None Transfers: Bed to chair/wheelchair/BSC;Sit to/from Stand Sit to Stand: Max assist;+2 physical assistance;+2 safety/equipment;From elevated surface;Mod assist          Lateral/Scoot Transfers: +2 physical assistance;Mod assist General transfer comment: pt scooted to R from bed to chair, chair slightly lower than bed. Able to use RUE and RLE, LLE held in place on floor to allow for minimal pushing with it. Mod A +2 at hips to elevate enough to clear bed to scoot to R. Pt then performed sit>stand from recliner to stedy with max A +2 for power up and midline posture Transfer via Lift Equipment: Stedy  Ambulation/Gait             Pre-gait activities: wt shifting in standing in stedy with mod A +2     Stairs             Wheelchair Mobility    Modified Rankin (Stroke Patients Only) Modified Rankin (Stroke Patients Only) Pre-Morbid Rankin Score: No symptoms Modified Rankin: Severe disability     Balance Overall balance assessment: Needs assistance Sitting-balance support: Feet supported;Single extremity  supported Sitting balance-Leahy Scale: Fair Sitting balance - Comments: vc's to attend to LLE which falls off bed when pt not paying attention Postural control: Left lateral lean Standing balance support: Bilateral upper extremity supported;During functional activity;Reliant on assistive device for balance Standing balance-Leahy Scale:  Zero Standing balance comment: stood 2 mins in stedy for wt shifting, 2x                            Cognition Arousal/Alertness: Awake/alert Behavior During Therapy: WFL for tasks assessed/performed Overall Cognitive Status: History of cognitive impairments - at baseline                                 General Comments: no change from session yesterday, follows 1-2 step commands appropriately with normal timing        Exercises General Exercises - Lower Extremity Ankle Circles/Pumps: AAROM;Left;10 reps;Seated Long Arc Quad: AROM;Left;10 reps;Seated    General Comments General comments (skin integrity, edema, etc.): SPO2 90's on RA      Pertinent Vitals/Pain Pain Assessment: Faces Faces Pain Scale: Hurts even more Pain Location: middle upper back since CT last night Pain Descriptors / Indicators: Constant;Discomfort Pain Intervention(s): Limited activity within patient's tolerance;Monitored during session;Heat applied    Home Living                          Prior Function            PT Goals (current goals can now be found in the care plan section) Acute Rehab PT Goals Patient Stated Goal: regain strength and independence PT Goal Formulation: With patient Time For Goal Achievement: 03/24/21 Potential to Achieve Goals: Good Progress towards PT goals: Progressing toward goals    Frequency    Min 4X/week      PT Plan Current plan remains appropriate    Co-evaluation              AM-PAC PT "6 Clicks" Mobility   Outcome Measure  Help needed turning from your back to your side while in a flat bed without using bedrails?: A Lot Help needed moving from lying on your back to sitting on the side of a flat bed without using bedrails?: A Lot Help needed moving to and from a bed to a chair (including a wheelchair)?: Total Help needed standing up from a chair using your arms (e.g., wheelchair or bedside chair)?: Total Help  needed to walk in hospital room?: Total Help needed climbing 3-5 steps with a railing? : Total 6 Click Score: 8    End of Session Equipment Utilized During Treatment: Gait belt Activity Tolerance: Patient tolerated treatment well Patient left: in chair;with call bell/phone within reach;with chair alarm set Nurse Communication: Need for lift equipment;Mobility status PT Visit Diagnosis: Other abnormalities of gait and mobility (R26.89);Hemiplegia and hemiparesis Hemiplegia - Right/Left: Left Hemiplegia - dominant/non-dominant: Non-dominant Hemiplegia - caused by: Nontraumatic SAH     Time: 3329-5188 PT Time Calculation (min) (ACUTE ONLY): 37 min  Charges:  $Gait Training: 8-22 mins $Therapeutic Activity: 8-22 mins                     Lyanne Co, PT  Acute Rehab Services  Pager [phone] Office [phone]    Lawana Chambers Loranda Mastel 03/14/2021, 11:49 AM

## 2021-03-15 LAB — GLUCOSE, CAPILLARY
Glucose-Capillary: 217 mg/dL — ABNORMAL HIGH (ref 70–99)
Glucose-Capillary: 275 mg/dL — ABNORMAL HIGH (ref 70–99)
Glucose-Capillary: 241 mg/dL — ABNORMAL HIGH (ref 70–99)
Glucose-Capillary: 239 mg/dL — ABNORMAL HIGH (ref 70–99)

## 2021-03-15 MED ORDER — INSULIN DETEMIR 100 UNIT/ML ~~LOC~~ SOLN
30.0000 [IU] | Freq: Two times a day (BID) | SUBCUTANEOUS | Status: DC
  Administered 2021-03-15 – 2021-03-17 (×4): 30 [IU] via SUBCUTANEOUS
  Filled 2021-03-15 (×5): qty 0.3

## 2021-03-15 MED ORDER — ATORVASTATIN CALCIUM 40 MG PO TABS
40.0000 mg | ORAL_TABLET | Freq: Every day | ORAL | Status: DC
  Administered 2021-03-15 – 2021-03-17 (×3): 40 mg via ORAL
  Filled 2021-03-15 (×3): qty 1

## 2021-03-15 NOTE — Assessment & Plan Note (Addendum)
CBGs still somewhat elevated.  Appreciate diabetic coordinator's assistance and have increased Levemir to 30 units twice daily on 12/28.  Reassess after 12/29.

## 2021-03-15 NOTE — Progress Notes (Signed)
Inpatient Diabetes Program Recommendations  AACE/ADA: New Consensus Statement on Inpatient Glycemic Control (2015)  Target Ranges:  Prepandial:   less than 140 mg/dL      Peak postprandial:   less than 180 mg/dL (1-2 hours)      Critically ill patients:  140 - 180 mg/dL   Lab Results  Component Value Date   GLUCAP 275 (H) 03/15/2021   HGBA1C 11.1 (H) 03/10/2021    Review of Glycemic Control  Latest Reference Range & Units 03/14/21 08:06 03/14/21 12:13 03/14/21 17:31 03/14/21 21:31 03/15/21 07:37 03/15/21 11:20  Glucose-Capillary 70 - 99 mg/dL 177 (H) 939 (H) 030 (H) 206 (H) 217 (H) 275 (H)  (H): Data is abnormally high Diabetes history: Type 2 DM Outpatient Diabetes medications: Levemir 40 units QD, Novolog 10 units TID, Trulicity 3 mg Qwk, Farxiga 10 mg QD Current orders for Inpatient glycemic control: Levemir 27 units bid, Novolog 10 units tid meal coverage, Novolog 0-15 units Q4H     Inpatient Diabetes Program Recommendations:   Please consider: -Increase Levemir to 30 units bid  Thank you, Darel Hong E. Kadan Millstein, RN, MSN, CDE  Diabetes Coordinator Inpatient Glycemic Control Team Team Pager [phone] (8am-5pm) 03/15/2021 2:30 PM

## 2021-03-15 NOTE — Progress Notes (Signed)
[facility] Progress Note  Patient: Joseph Ortega    SWN:[id number]  DOA: 03/08/2021    Date of Service: the patient was seen and examined on 03/15/2021  Brief hospital course: 65 year old male with past medical history of hypertension & diabetes presented to the emergency room at [facility] on 12/21 with left-sided weakness and CT scan of head noted 4 mm intraparenchymal hematoma in the right posterior frontal lobe with subarachnoid penetration.  Patient started on Keppra and Cleviprex.  Also incidentally noted to have rash on left side of chest consistent with shingles.  Transferred to [facility] for neuro ICU.  Follow-up MRI reveals new right occipital lobe infarction and enlargement of posterior frontal infarction.  Cleviprex stopped.  Concerns for embolic etiology and so patient started on loop recorder.  Seen by PT and OT initially recommended inpatient rehab, but there may be insurance issues.  Therefore patient is can now looking at skilled nursing.  Assessment and Plan: Cardiovascular and Mediastinum Essential hypertension Assessment & Plan On valsartan and metoprolol.  Initially allowed for permissive hypertension.  Blood pressures stable, although had some slightly low blood pressures last night  Respiratory OSA (obstructive sleep apnea) Assessment & Plan Continue nightly BiPAP  Endocrine Hypothyroidism Assessment & Plan Elevated TSH, but normal T4 and likely in the context of other acute medical problems.  Would not make any adjustments in Synthroid at this time and recheck labs 6 weeks after discharge.  Uncontrolled type 2 diabetes mellitus with hyperglycemia (HCC) Assessment & Plan CBGs still somewhat elevated.  Appreciate diabetic coordinator's assistance and have increased Levemir to 30 units twice daily.  Nervous and Auditory * SAH (subarachnoid hemorrhage) [facility]) Assessment & Plan Appreciate neurology help.  Patient with right frontal localized  intraparenchymal hemorrhage and underlying hemorrhagic infarct with etiology indeterminant possibly secondary to venous angioma.  Repeat MRI on 12/23 notes new acute infarction of right occipital lobe and enlargement of posterior frontal infarction.  On Keppra 500 twice a day.  Looking at skilled nursing.  LDL at 106.  A1c 11.1.  Not on any antithrombotics currently.  Seen by neurology and follow-up with signed off.  Repeat CVA felt to be possibly atherosclerosis versus embolic.  Patient will need 30-day event monitor.  Other Redness of both eyes Assessment & Plan No pain or loss of vision.  Started eyedrops on 12/25.  Both eyes have since improved  Tobacco abuse Assessment & Plan Counseled.  Nicotine patch.  Depression Assessment & Plan Home medicines restarted  Herpes zoster with complication Assessment & Plan Left anterior chest around T10 dermatome.  On Valtrex 3 times daily for 2 to 3 weeks.  Morbid obesity (HCC) Assessment & Plan Meets criteria BMI greater than 35+ comorbidities of diabetes and hypertension and CVA     Body mass index is 37.49 kg/m.        Consultants: Neurology Critical care Neurosurgery Interventional radiology  Procedures: None  Antimicrobials: None  Code Status: Full code   Subjective: Patient felt better.  Feels like he is able to start moving his left arm and both his legs a little more than before.  Objective: Vital signs were reviewed and unremarkable. Vitals:   03/15/21 0740 03/15/21 1230  BP: (!) 155/74 (!) 123/59  Pulse: 77 71  Resp: 18 19  Temp: 99 F (37.2 C) 98.7 F (37.1 C)  SpO2: 93% 93%    Intake/Output Summary (Last 24 hours) at 03/15/2021 1451 Last data filed at 03/15/2021 0830 Gross per 24 hour  Intake 240 ml  Output 400 ml  Net -160 ml    Filed Weights   03/12/21 0500 03/13/21 0500 03/15/21 0423  Weight: 132.6 kg 129.7 kg 128.9 kg   Body mass index is 37.49 kg/m.  Exam:  General: Alert and  oriented x3, no acute distress HEENT: Normocephalic and atraumatic, mucous membranes are moist.  Left eye with mild injection of sclera Cardiovascular: Regular rate and rhythm, S1-S2 Respiratory: Clear to auscultation bilaterally Abdomen: Soft, nontender, nondistended, positive bowel sounds Musculoskeletal: No clubbing or cyanosis or edema.  See below for left-sided strength.   Skin: No skin breaks, tears or lesions Psychiatry: Appropriate, no evidence of psychoses Neurology: Left-sided hemiplegia complete on left upper extremity with some minimal strength on left lower extremity  Data Reviewed: Repeat CTA of head notes edema as above  Disposition:  Status is: Inpatient  Remains inpatient appropriate because: Needs skilled nursing   Family Communication: Left message for wife  DVT Prophylaxis: enoxaparin (LOVENOX) injection 40 mg Start: 03/10/21 1000 Place and maintain sequential compression device Start: 03/08/21 1317 SCDs Start: 03/08/21 1303    Author: Hollice Espy ,MD 03/15/2021 2:51 PM  To reach On-call, see care teams to locate the attending and reach out via [email or web address]. Between 7PM-7AM, please contact night-coverage If you still have difficulty reaching the attending provider, please page the [facility] (Director on Call) for [facility] on amion for assistance.

## 2021-03-15 NOTE — Progress Notes (Signed)
Physical Therapy Treatment Patient Details Name: Joseph Ortega MRN: [id number] DOB: [birth year] Today's Date: 03/15/2021   History of Present Illness Pt is a 65 y.o. male admitted 12/21 with dx of SAH. He presented to the ED via EMS from work with c/o L side weakness. 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 This hemorrhage has undergone subarachnoid extension and there is subarachnoid blood in the sulci of the right sylvian fissure and posterior frontal region. Follow up MRI also showed new R occipital infarct and enlargement of frontal infarct.   Pt also found to have shingles L anterior chest. PMH: DM2, HTN    PT Comments    Pt resting upon PT arrival to room, wakes easily and agreeable to EOB/OOB mobility. Pt overall requiring mod physical assist for bed mobility, and +2 for transfer OOB via scoot pivots. Pt demonstrates good weight shifting and boost up during scoot into drop arm recliner. Per chart review and per pt, plan is now to go to SNF since insurance will not approve CIR to SNF. PT to continue to follow.      Recommendations for follow up therapy are one component of a multi-disciplinary discharge planning process, led by the attending physician.  Recommendations may be updated based on patient status, additional functional criteria and insurance authorization.  Follow Up Recommendations  Skilled nursing-short term rehab (<3 hours/day)     Assistance Recommended at Discharge Frequent or constant Supervision/Assistance  Equipment Recommendations  Other (comment) (TBD)    Recommendations for Other Services Rehab consult     Precautions / Restrictions Precautions Precautions: Fall Precaution Comments: L weakness Restrictions Weight Bearing Restrictions: No Other Position/Activity Restrictions: Baseline L visual field impairment. Shingles L chest (airborne)     Mobility  Bed  Mobility Overal bed mobility: Needs Assistance Bed Mobility: Rolling;Sidelying to Sit Rolling: Mod assist Sidelying to sit: Min assist;HOB elevated       General bed mobility comments: min-mod assist for roll to R, LE lowering over EOB, pt performing most of trunk elevation via R hand pushing off of bed    Transfers Overall transfer level: Needs assistance   Transfers: Bed to chair/wheelchair/BSC            Lateral/Scoot Transfers: Mod assist;+2 physical assistance General transfer comment: mod +2 for scoot to R for truncal boost, hip translation. Pt with good boost through RLE to assist.    Ambulation/Gait                   Stairs             Wheelchair Mobility    Modified Rankin (Stroke Patients Only) Modified Rankin (Stroke Patients Only) Pre-Morbid Rankin Score: No symptoms Modified Rankin: Severe disability     Balance Overall balance assessment: Needs assistance Sitting-balance support: Feet supported;Single extremity supported Sitting balance-Leahy Scale: Fair   Postural control: Left lateral lean Standing balance support: Bilateral upper extremity supported;During functional activity;Reliant on assistive device for balance Standing balance-Leahy Scale: Zero                              Cognition Arousal/Alertness: Awake/alert Behavior During Therapy: WFL for tasks assessed/performed Overall Cognitive Status: History of cognitive impairments - at baseline  General Comments: pleasant, follows all mobility commands during session        Exercises Other Exercises Other Exercises: seated balance intervention: AP leaning to hand target x10, lateral leaning bilat to hand target x5 each direction, balancing with RUE propped on bed vs on bedrail vs in lap    General Comments        Pertinent Vitals/Pain Pain Assessment: Faces Faces Pain Scale: No hurt Pain Intervention(s):  Monitored during session    Home Living                          Prior Function            PT Goals (current goals can now be found in the care plan section) Acute Rehab PT Goals Patient Stated Goal: regain strength and independence PT Goal Formulation: With patient Time For Goal Achievement: 03/24/21 Potential to Achieve Goals: Good Progress towards PT goals: Progressing toward goals    Frequency    Min 3X/week      PT Plan Current plan remains appropriate    Co-evaluation              AM-PAC PT "6 Clicks" Mobility   Outcome Measure  Help needed turning from your back to your side while in a flat bed without using bedrails?: A Lot Help needed moving from lying on your back to sitting on the side of a flat bed without using bedrails?: A Lot Help needed moving to and from a bed to a chair (including a wheelchair)?: A Lot Help needed standing up from a chair using your arms (e.g., wheelchair or bedside chair)?: Total Help needed to walk in hospital room?: Total Help needed climbing 3-5 steps with a railing? : Total 6 Click Score: 9    End of Session   Activity Tolerance: Patient tolerated treatment well Patient left: in chair;with call bell/phone within reach;with chair alarm set Nurse Communication: Need for lift equipment;Mobility status (stedy back to bed) PT Visit Diagnosis: Other abnormalities of gait and mobility (R26.89);Hemiplegia and hemiparesis Hemiplegia - Right/Left: Left Hemiplegia - dominant/non-dominant: Non-dominant Hemiplegia - caused by: Nontraumatic SAH     Time: 7517-0017 PT Time Calculation (min) (ACUTE ONLY): 19 min  Charges:  $Therapeutic Activity: 8-22 mins                     Marye Round, PT DPT Acute Rehabilitation Services Pager [phone]  Office [phone]    Tyrone Apple E Christain [place] 03/15/2021, 2:06 PM

## 2021-03-16 LAB — GLUCOSE, CAPILLARY
Glucose-Capillary: 229 mg/dL — ABNORMAL HIGH (ref 70–99)
Glucose-Capillary: 268 mg/dL — ABNORMAL HIGH (ref 70–99)
Glucose-Capillary: 248 mg/dL — ABNORMAL HIGH (ref 70–99)
Glucose-Capillary: 228 mg/dL — ABNORMAL HIGH (ref 70–99)

## 2021-03-16 NOTE — Progress Notes (Signed)
Occupational Therapy Treatment Patient Details Name: Joseph Ortega MRN: [id number] DOB: 1955-05-23 Today's Date: 03/16/2021   History of present illness Pt is a 65 y.o. male admitted 12/21 with dx of SAH. He presented to the ED via EMS from work with c/o L side weakness. 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 This hemorrhage has undergone subarachnoid extension and there is subarachnoid blood in the sulci of the right sylvian fissure and posterior frontal region. Follow up MRI also showed new R occipital infarct and enlargement of frontal infarct.   Pt also found to have shingles L anterior chest. PMH: DM2, HTN   OT comments  Patient received in bed and on the bedpan. Patient was mod assist to roll to left to assist with hygiene/cleaning. Patient was mod assist to get to EOB and performed squat pivot transfer into recliner with max assist.  Patient tolerated AAROM to LUE shoulder, elbow, and hand with trace movement noted at shoulder and elbow. Acute OT to continue to follow.    Recommendations for follow up therapy are one component of a multi-disciplinary discharge planning process, led by the attending physician.  Recommendations may be updated based on patient status, additional functional criteria and insurance authorization.    Follow Up Recommendations  Acute inpatient rehab (3hours/day)    Assistance Recommended at Discharge Frequent or constant Supervision/Assistance  Equipment Recommendations  Wheelchair (measurements OT);Wheelchair cushion (measurements OT)    Recommendations for Other Services      Precautions / Restrictions Precautions Precautions: Fall Precaution Comments: L weakness Restrictions Weight Bearing Restrictions: No       Mobility Bed Mobility Overal bed mobility: Needs Assistance Bed Mobility: Rolling;Sidelying to Sit Rolling: Mod assist Sidelying to sit: Mod  assist;HOB elevated       General bed mobility comments: rolled in bed to assist with toilet hygiene, mod assist to get to EOB    Transfers Overall transfer level: Needs assistance Equipment used: Ambulation equipment used;None Transfers: Bed to chair/wheelchair/BSC Sit to Stand: Max assist     Squat pivot transfers: Max assist     General transfer comment: max assist for squat pivot transfer from EOB to recliner     Balance Overall balance assessment: Needs assistance Sitting-balance support: Feet supported;Single extremity supported Sitting balance-Leahy Scale: Fair Sitting balance - Comments: reliant on RUE to assist with balance Postural control: Left lateral lean                                 ADL either performed or assessed with clinical judgement   ADL Overall ADL's : Needs assistance/impaired                             Toileting- Clothing Manipulation and Hygiene: Total assistance;Bed level Toileting - Clothing Manipulation Details (indicate cue type and reason): patient rolled to left to assist with toilet hygiene       General ADL Comments: Patient on bed pan when therapist arrived and assisted with cleaning by rolling to left using rail    Extremity/Trunk Assessment Upper Extremity Assessment LUE Deficits / Details: 0/5 hand/elbow, 1/5 shoulder, edema noted at hand LUE Sensation: decreased light touch LUE Coordination: decreased fine motor;decreased gross motor            Vision       Perception  Praxis      Cognition Arousal/Alertness: Awake/alert Behavior During Therapy: WFL for tasks assessed/performed Overall Cognitive Status: History of cognitive impairments - at baseline Area of Impairment: Problem solving                             Problem Solving: Slow processing General Comments: followed commands, aware of deficits          Exercises Exercises: General Upper Extremity General  Exercises - Upper Extremity Shoulder Flexion: AAROM;Left;10 reps;Seated Shoulder Extension: AAROM;Left;10 reps;Seated Shoulder ABduction: AAROM;Left;10 reps;Seated Shoulder ADduction: AAROM;Left;10 reps;Seated Elbow Flexion: AAROM;Left;10 reps;Seated Elbow Extension: AAROM;Left;10 reps;Seated Wrist Flexion: PROM;Left;10 reps;Seated Wrist Extension: PROM;Left;10 reps;Seated   Shoulder Instructions       General Comments      Pertinent Vitals/ Pain       Pain Assessment: No/denies pain  Home Living                                          Prior Functioning/Environment              Frequency  Min 2X/week        Progress Toward Goals  OT Goals(current goals can now be found in the care plan section)  Progress towards OT goals: Progressing toward goals  Acute Rehab OT Goals Patient Stated Goal: go to rehab OT Goal Formulation: With patient Time For Goal Achievement: 03/24/21 Potential to Achieve Goals: Good ADL Goals Pt Will Perform Grooming: with supervision;sitting Pt Will Perform Upper Body Bathing: with min assist;sitting Pt Will Perform Upper Body Dressing: with min assist;sitting Pt Will Transfer to Toilet: with mod assist;squat pivot transfer;bedside commode Pt Will Perform Toileting - Clothing Manipulation and hygiene: with mod assist;sit to/from stand Pt/caregiver will Perform Home Exercise Program: Increased ROM;Increased strength;Left upper extremity;With Supervision  Plan Discharge plan remains appropriate    Co-evaluation                 AM-PAC OT "6 Clicks" Daily Activity     Outcome Measure   Help from another person eating meals?: A Little Help from another person taking care of personal grooming?: A Little Help from another person toileting, which includes using toliet, bedpan, or urinal?: A Lot Help from another person bathing (including washing, rinsing, drying)?: A Lot Help from another person to put on and taking  off regular upper body clothing?: A Lot Help from another person to put on and taking off regular lower body clothing?: Total 6 Click Score: 13    End of Session Equipment Utilized During Treatment: Gait belt  OT Visit Diagnosis: Unsteadiness on feet (R26.81);Muscle weakness (generalized) (M62.81);Hemiplegia and hemiparesis Hemiplegia - Right/Left: Left Hemiplegia - dominant/non-dominant: Non-Dominant Hemiplegia - caused by: Cerebral infarction;Nontraumatic SAH   Activity Tolerance Patient tolerated treatment well   Patient Left in chair;with call bell/phone within reach;with chair alarm set   Nurse Communication Mobility status;Need for lift equipment        Time: 0354-6568 OT Time Calculation (min): 31 min  Charges: OT General Charges $OT Visit: 1 Visit OT Treatments $Self Care/Home Management : 8-22 mins $Neuromuscular Re-education: 8-22 mins  Alfonse Flavors, OTA Acute Rehabilitation Services  Pager [phone] Office [phone]   Dewain Penning 03/16/2021, 11:53 AM

## 2021-03-16 NOTE — Progress Notes (Signed)
[facility] Progress Note  Patient: Joseph Ortega    WGN:[id number]  DOA: 03/08/2021    Date of Service: the patient was seen and examined on 03/16/2021  Brief hospital course: 65 year old male with past medical history of hypertension & diabetes presented to the emergency room at [facility] on 12/21 with left-sided weakness and CT scan of head noted 4 mm intraparenchymal hematoma in the right posterior frontal lobe with subarachnoid penetration.  Patient started on Keppra and Cleviprex.  Also incidentally noted to have rash on left side of chest consistent with shingles.  Transferred to [facility] for neuro ICU.  Follow-up MRI reveals new right occipital lobe infarction and enlargement of posterior frontal infarction.  Cleviprex stopped.  Concerns for embolic etiology.  Work-up complete with plans for 30-day event recorder.  Seen by PT and OT initially recommended inpatient rehab.  Assessment and Plan: Cardiovascular and Mediastinum Essential hypertension Assessment & Plan On valsartan and metoprolol.  Allowing for some permissive hypertension although at times, blood pressure in normal range.  Respiratory OSA (obstructive sleep apnea) Assessment & Plan Continue nightly BiPAP  Endocrine Hypothyroidism Assessment & Plan Elevated TSH, but normal T4 and likely in the context of other acute medical problems.  Would not make any adjustments in Synthroid at this time and recheck labs 6 weeks after discharge.  Uncontrolled type 2 diabetes mellitus with hyperglycemia (HCC) Assessment & Plan CBGs still somewhat elevated.  Appreciate diabetic coordinator's assistance and have increased Levemir to 30 units twice daily on 12/28.  Reassess after 12/29.  Nervous and Auditory * SAH (subarachnoid hemorrhage) [facility]) Assessment & Plan Appreciate neurology help.  Patient with right frontal localized intraparenchymal hemorrhage and underlying hemorrhagic infarct with etiology  indeterminant possibly secondary to venous angioma.  Repeat MRI on 12/23 notes new acute infarction of right occipital lobe and enlargement of posterior frontal infarction.  On Keppra 500 twice a day.  Looking at skilled nursing.  LDL at 106.  A1c 11.1.  Not on any antithrombotics currently.  Seen by neurology and follow-up with signed off.  Repeat CVA felt to be possibly atherosclerosis versus embolic.  Patient will need 30-day event monitor.  Other Redness of both eyes Assessment & Plan No pain or loss of vision.  Started eyedrops on 12/25.  Both eyes have since improved  Tobacco abuse Assessment & Plan Counseled.  Nicotine patch.  Depression Assessment & Plan Home medicines restarted  Herpes zoster with complication Assessment & Plan Left anterior chest around T10 dermatome.  On Valtrex 3 times daily for 2 to 3 weeks.  Will discontinue airborne precautions.  Lesions scabbed over.  Morbid obesity (HCC) Assessment & Plan Meets criteria BMI greater than 35+ comorbidities of diabetes and hypertension and CVA     Body mass index is 37.67 kg/m.        Consultants: Neurology Critical care Neurosurgery Interventional radiology  Procedures: None  Antimicrobials: None  Code Status: Full code   Subjective: Patient feeling better, increase strength.  Objective: Vital signs were reviewed and unremarkable. Vitals:   03/16/21 0810 03/16/21 1100  BP: (!) 179/84 (!) 134/55  Pulse: 69 67  Resp:  18  Temp: 98.5 F (36.9 C) 98.6 F (37 C)  SpO2: 94% 93%    Intake/Output Summary (Last 24 hours) at 03/16/2021 1407 Last data filed at 03/16/2021 1110 Gross per 24 hour  Intake --  Output 280 ml  Net -280 ml    Filed Weights   03/13/21 0500 03/15/21 0423 03/16/21 0347  Weight: 129.7 kg 128.9 kg 129.5 kg   Body mass index is 37.67 kg/m.  Exam:  General: Alert and oriented x3, no acute distress HEENT: Normocephalic and atraumatic, mucous membranes are moist.   Left eye with mild injection of sclera Cardiovascular: Regular rate and rhythm, S1-S2 Respiratory: Clear to auscultation bilaterally Abdomen: Soft, nontender, nondistended, positive bowel sounds Musculoskeletal: No clubbing or cyanosis or edema.  See below for left-sided strength.   Skin: No skin breaks, tears or lesions Psychiatry: Appropriate, no evidence of psychoses Neurology: Left-sided hemiplegia and actually able to move left upper extremity mildly as well as both lower extremities  Data Reviewed: Repeat CTA of head notes edema as above  Disposition:  Status is: Inpatient  Remains inpatient appropriate because: Applying for inpatient rehab   Family Communication: Left message for wife  DVT Prophylaxis: enoxaparin (LOVENOX) injection 40 mg Start: 03/10/21 1000 Place and maintain sequential compression device Start: 03/08/21 1317 SCDs Start: 03/08/21 1303    Author: Hollice Espy ,MD 03/16/2021 2:07 PM  To reach On-call, see care teams to locate the attending and reach out via [email or web address]. Between 7PM-7AM, please contact night-coverage If you still have difficulty reaching the attending provider, please page the [facility] (Director on Call) for [facility] on amion for assistance.

## 2021-03-16 NOTE — Progress Notes (Signed)
Inpatient Diabetes Program Recommendations  AACE/ADA: New Consensus Statement on Inpatient Glycemic Control (2015)  Target Ranges:  Prepandial:   less than 140 mg/dL      Peak postprandial:   less than 180 mg/dL (1-2 hours)      Critically ill patients:  140 - 180 mg/dL   Lab Results  Component Value Date   GLUCAP 229 (H) 03/16/2021   HGBA1C 11.1 (H) 03/10/2021    Review of Glycemic Control  Latest Reference Range & Units 03/15/21 11:20 03/15/21 16:48 03/15/21 21:14 03/16/21 08:09  Glucose-Capillary 70 - 99 mg/dL 194 (H) 174 (H) 081 (H) 229 (H)  (H): Data is abnormally high  Diabetes history: Type 2 DM Outpatient Diabetes medications: Levemir 40 units QD, Novolog 10 units TID, Trulicity 3 mg Qwk, Farxiga 10 mg QD Current orders for Inpatient glycemic control: Levemir 30 units bid, Novolog 10 units tid meal coverage, Novolog 0-15 units TID & HS     Inpatient Diabetes Program Recommendations:    Consider further increasing Levemir to 35 units BID and increasing Novolog to 14 units TID (assuming patient consuming >50% of meals).   Thanks, Lujean Rave, MSN, RNC-OB Diabetes Coordinator [phone] (8a-5p)

## 2021-03-16 NOTE — Progress Notes (Addendum)
Inpatient Rehabilitation Admissions Coordinator   I met at bedside with patient and spoke with his wife by phone. They now would like to pursue CIR admit and wife will hire assistance for home caregiver support. I reiterated as discussed with Urban Gibson, [facility] that [employer] will not approve both CIR admit and then SNF rehab after discharged from CIR. Airborne precautions to be removed today per Dr [place] Pink. I will begin Auth with [employer] for possible CIR admit.  Danne Baxter, RN, MSN Rehab Admissions Coordinator [phone] 03/16/2021 1:15 PM

## 2021-03-16 NOTE — Progress Notes (Signed)
Patient placed on Bipap (dreamstation) for the night with 2 L O2 bleed in.  Patient tolerating at this time.  RT will continue to monitor.

## 2021-03-16 NOTE — PMR Pre-admission (Signed)
PMR Admission Coordinator Pre-Admission Assessment  Patient: Joseph Ortega is an 65 y.o., male MRN: [id number] DOB: 10-29-1955 Height:   Weight: 129 kg  Insurance Information HMO:     PPO:      PCP:      IPA:      80/20:      OTHER:  PRIMARY: [employer] [place]  Policy#: [id number]      Subscriber: pt CM Name: Tawanna Cooler      Phone#: [phone]     Fax#: [phone] Pre-Cert#: [id number]   approved until 03/27/21   Employer:  Benefits:  Phone #: [phone]     Name: 12/29 Eff. Date: 06/17/2020     Deduct: $500      Out of Pocket Max: $1500      Life Max: none CIR: 80%      SNF: 80% 60 days Outpatient: 80%     Co-Pay: 20% 30 visits combined Home Health: 80%      Co-Pay: 20% DME: 80%     Co-Pay: 20% Providers: in network  SECONDARY: none        Financial Counselor:       Phone#:   The [employer] for patients in Inpatient Rehabilitation Facilities with attached Privacy Act [place] Records was provided and verbally reviewed with: N/A  Emergency Contact Information Contact Information     Name Relation Home Work Mobile   Starlin,[name] Spouse   [phone]      Current Medical History  Patient Admitting Diagnosis: SAH  History of Present Illness: 65 year old male with history of Dm T2, HTN, thyroid disease and OSA who presented to [facility] on 03/08/21 with sudden onset of left sided weakness and dysarthria. 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 Patient started on Keppra and Cleviprex. Also noted incidentally to have a rash on left side of chest consistent with shingles. Transferred to [facility] hospital for Neuro ICU.   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 Cleviprex weaned. Concerns for embolic etiology. On Valsartan and metoprolol  with permissive HTN allowance. 30 day event  monitor recommended.  Now on ASA. No DAPT given HT and SAH per Neurology. Continue nightly BIPAP for OSA as prior to admit. Elevated TSH but normal T 4. On synthroid and plans To recheck labs as an outpatient.   Uncontrolled type 2 DM with Hgb A1c 11.1. Diabetic coordinator consulted. 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 Lesions scabbed over. On Valtrex. On Lovenox for DVT prophylaxis and SCD.    Complete NIHSS TOTAL: 11  Patient's medical record from [facility] has been reviewed by the rehabilitation admission coordinator and physician.  Past Medical History  Past Medical History:  Diagnosis Date   Diabetes mellitus without complication ([place])    Hypertension    Thyroid disease    Has the patient had major surgery during 100 days prior to admission? No  Family History   family history is not on file.  Current Medications  Current Facility-Administered Medications:    0.9 %  sodium chloride infusion, , Intravenous, PRN, Greta Doom, MD, Paused at 03/09/21 [phone]   acetaminophen (TYLENOL) tablet 650 mg, 650 mg, Oral, Q6H PRN, Jennelle Human B, NP, 650 mg at 03/16/21 1714   aspirin EC tablet 81 mg, 81  mg, Oral, Daily, de Yolanda Manges, [place] E, NP, 81 mg at 03/16/21 0914   atorvastatin (LIPITOR) tablet 40 mg, 40 mg, Oral, Daily, Rosalin Hawking, MD, 40 mg at 03/16/21 0914   carvedilol (COREG) tablet 6.25 mg, 6.25 mg, Oral, BID WC, Maryjane Hurter, MD, 6.25 mg at 03/16/21 1714   Chlorhexidine Gluconate Cloth 2 % PADS 6 each, 6 each, Topical, Daily, Greta Doom, MD, 6 each at 03/16/21 0926   docusate sodium (COLACE) capsule 100 mg, 100 mg, Oral, BID, Mick Sell, PA-C, 100 mg at 03/15/21 0092   doxazosin (CARDURA) tablet 8 mg, 8 mg, Oral, Daily, de Yolanda Manges, [place] E, NP, 8 mg at 03/16/21 0914   DULoxetine (CYMBALTA) DR capsule 20 mg, 20 mg, Oral, BID, Gevena Barre  K, MD, 20 mg at 03/16/21 2131   enoxaparin (LOVENOX) injection 40 mg, 40 mg, Subcutaneous, Q24H, Palikh, Gaurang M, MD, 40 mg at 03/16/21 0914   insulin aspart (novoLOG) injection 0-15 Units, 0-15 Units, Subcutaneous, TID WC, Payne, John D, PA-C, 5 Units at 03/16/21 1715   insulin aspart (novoLOG) injection 0-5 Units, 0-5 Units, Subcutaneous, QHS, Payne, John D, PA-C, 2 Units at 03/16/21 2258   insulin aspart (novoLOG) injection 10 Units, 10 Units, Subcutaneous, TID WC, Annita Brod, MD, 10 Units at 03/16/21 1716   insulin detemir (LEVEMIR) injection 30 Units, 30 Units, Subcutaneous, BID, Annita Brod, MD, 30 Units at 03/16/21 2132   irbesartan (AVAPRO) tablet 300 mg, 300 mg, Oral, Daily, Maryjane Hurter, MD, 300 mg at 03/16/21 0914   labetalol (NORMODYNE) injection 10 mg, 10 mg, Intravenous, Q2H PRN, Maryjane Hurter, MD, 10 mg at 03/17/21 0341   levothyroxine (SYNTHROID) tablet 125 mcg, 125 mcg, Oral, Q0600, Maryjane Hurter, MD, 125 mcg at 03/17/21 0518   lidocaine (LIDODERM) 5 % 1 patch, 1 patch, Transdermal, Q24H, Greta Doom, MD, 1 patch at 03/15/21 1846   mirtazapine (REMERON) tablet 7.5 mg, 7.5 mg, Oral, QHS, Gevena Barre K, MD, 7.5 mg at 03/16/21 2131   naphazoline-glycerin (CLEAR EYES REDNESS) ophth solution 1-2 drop, 1-2 drop, Both Eyes, QID PRN, Annita Brod, MD   polyethylene glycol (MIRALAX / GLYCOLAX) packet 17 g, 17 g, Oral, Daily PRN, Jennelle Human B, NP, 17 g at 03/12/21 1116   traZODone (DESYREL) tablet 50 mg, 50 mg, Oral, QHS, Annita Brod, MD, 50 mg at 03/16/21 2131   valACYclovir (VALTREX) tablet 1,000 mg, 1,000 mg, Oral, TID, Maryjane Hurter, MD, 1,000 mg at 03/16/21 2132  Patients Current Diet:  Diet Order             Diet Carb Modified Fluid consistency: Thin; Room service appropriate? Yes  Diet effective now                  Precautions / Restrictions Precautions Precautions: Fall Precaution Comments: L  weakness Restrictions Weight Bearing Restrictions: No Other Position/Activity Restrictions: off shingles precautions   Has the patient had 2 or more falls or a fall with injury in the past year? No  Prior Activity Level Community (5-7x/wk): independent, working and driving  Prior Functional Level Self Care: Did the patient need help bathing, dressing, using the toilet or eating? Independent  Indoor Mobility: Did the patient need assistance with walking from room to room (with or without device)? Independent  Stairs: Did the patient need assistance with internal or external stairs (with or without device)? Independent  Functional Cognition: Did the patient need help planning  regular tasks such as shopping or remembering to take medications? Independent  Patient Information Are you of Hispanic, Latino/a,or Spanish origin?: A. No, not of Hispanic, Latino/a, or Spanish origin What is your race?: A. White Do you need or want an interpreter to communicate with a doctor or health care staff?: 0. No  Patient's Response To:  Health Literacy and Transportation Is the patient able to respond to health literacy and transportation needs?: Yes Health Literacy - How often do you need to have someone help you when you read instructions, pamphlets, or other written material from your doctor or pharmacy?: Never In the past 12 months, has lack of transportation kept you from medical appointments or from getting medications?: No In the past 12 months, has lack of transportation kept you from meetings, work, or from getting things needed for daily living?: No  Development worker, international aid / [place] Devices/Equipment:  (reading glasses and hearing aids (not at bedside)) Home Equipment: Shower seat - built in, [employer] - tub/shower  Prior Device Use: Indicate devices/aids used by the patient prior to current illness, exacerbation or injury? None of the above  Current Functional  Level Cognition  Arousal/Alertness: Awake/alert Overall Cognitive Status: History of cognitive impairments - at baseline Orientation Level: Oriented X4 General Comments: followed commands, aware of deficits Attention: Divided Divided Attention: Appears intact Memory: Impaired Memory Impairment: Retrieval deficit Awareness: Appears intact Problem Solving: Appears intact    Extremity Assessment (includes Sensation/Coordination)  Upper Extremity Assessment: LUE deficits/detail LUE Deficits / Details: 0/5 hand/elbow, 1/5 shoulder, edema noted at hand LUE Sensation: decreased light touch LUE Coordination: decreased fine motor, decreased gross motor  Lower Extremity Assessment: Defer to PT evaluation LLE Deficits / Details: 0/5 DF, 2/5 knee and hip LLE Sensation: decreased light touch, decreased proprioception    ADLs  Overall ADL's : Needs assistance/impaired Eating/Feeding: Set up, Bed level Grooming: Wash/dry hands, Wash/dry face, Set up, Bed level Upper Body Bathing: Moderate assistance, Bed level Lower Body Bathing: Maximal assistance, Bed level Upper Body Dressing : Maximal assistance, Bed level Lower Body Dressing: Total assistance, Bed level Toilet Transfer: Moderate assistance, +2 for physical assistance, +2 for safety/equipment (used stedy) Toilet Transfer Details (indicate cue type and reason): mod assist +2 to use stedy for transfer to [place] and Hygiene: Total assistance, Bed level Toileting - Clothing Manipulation Details (indicate cue type and reason): patient rolled to left to assist with toilet hygiene General ADL Comments: Patient on bed pan when therapist arrived and assisted with cleaning by rolling to left using rail    Mobility  Overal bed mobility: Needs Assistance Bed Mobility: Rolling, Sit to Sidelying Rolling: Mod assist Sidelying to sit: Mod assist, HOB elevated Supine to sit: Mod assist, HOB elevated Sit to supine: Max  assist, HOB elevated Sit to sidelying: Mod assist General bed mobility comments: mod assist for trunk lowering, LE lifting into bed, and roll onto back. Pt able to boost self up with use of RUE and knees bent    Transfers  Overall transfer level: Needs assistance Equipment used: Ambulation equipment used, None Transfers: Bed to chair/wheelchair/BSC, Sit to/from Stand Sit to Stand: Max assist, +2 physical assistance Bed to/from chair/wheelchair/BSC transfer type:: Squat pivot Squat pivot transfers: Max assist  Lateral/Scoot Transfers: Mod assist, +2 physical assistance Transfer via Lift Equipment: Stedy General transfer comment: max +2 to stand from low recliner for power up, hip rise with bed pad, and steadying upon standing. Mod assist +1 to stand from  elevated height of stedy. STS from stedy x4.    Ambulation / Gait / Stairs / Wheelchair Mobility  Ambulation/Gait General Gait Details: unable Pre-gait activities: wt shifting in standing in stedy with mod A +2    Posture / Balance Dynamic Sitting Balance Sitting balance - Comments: reliant on RUE to assist with balance Balance Overall balance assessment: Needs assistance Sitting-balance support: Feet supported, Single extremity supported Sitting balance-Leahy Scale: Fair Sitting balance - Comments: reliant on RUE to assist with balance Postural control: Left lateral lean Standing balance support: Bilateral upper extremity supported, During functional activity, Reliant on assistive device for balance Standing balance-Leahy Scale: Poor Standing balance comment: stood 2 mins in stedy for wt shifting, 2x    Special needs/care consideration  Recent shingles off airborne precautions 12/29 Hgb A1c 11.1 BiPAP Dream station prior to admit Wife to hire caregiver supports at home as needed   Previous Home Environment  Living Arrangements: Spouse/significant other  Lives With: Spouse Available Help at Discharge: Family, Available 24  hours/day (wife will hire caregivers) Type of Home: [place]: One level Home Access: Ramped entrance Bathroom Shower/Tub: Multimedia programmer: Handicapped height [place] Accessibility: Yes How Accessible: Accessible via [place]: No Additional Comments: Pt's wife uses RW adn wheelchair for mobility  Discharge Living Setting Plans for Discharge Living Setting: Patient's home, Lives with (comment) (spouse) Type of Home at Discharge: House Discharge Home Layout: One level Discharge Home Access: [place] entrance Discharge Bathroom Shower/Tub: Walk-in shower Discharge Bathroom Toilet: Handicapped height Discharge Bathroom Accessibility: Yes How Accessible: Accessible via walker Does the patient have any problems obtaining your medications?: No  Social/Family/Support Systems Patient Roles: Spouse Contact Information: wife, [name] Anticipated Caregiver: wife and hired caregivers Anticipated Ambulance person Information: see contacts Ability/Limitations of Caregiver: wife uses RW and wheelchair for mobility; she is a retired Pension scheme manager: 24/7 Discharge Plan Discussed with Primary Caregiver: Yes Is Caregiver In Agreement with Plan?: Yes Does Caregiver/Family have Issues with Lodging/Transportation while Pt is in Rehab?: No  Goals Patient/Family Goal for Rehab: supervision to min asisst with PT and OT Expected length of stay: ELOS 10 to 14 days Pt/Family Agrees to Admission and willing to participate: Yes Program Orientation Provided & Reviewed with Pt/Caregiver Including Roles  & Responsibilities: Yes Additional Information Needs: I discussed that BCBS will not approve both CIR and SNF after CIR discharge  Decrease burden of Care through IP rehab admission: n/a  Possible need for SNF placement upon discharge: I discussed with wife and patient that [employer] will not approve both CIR admit as well as SNF after CIR Patient  Condition: I have reviewed medical records from [facility] , spoken with CM, and patient and spouse. I met with patient at the bedside for inpatient rehabilitation assessment.  Patient will benefit from ongoing PT and OT, can actively participate in 3 hours of therapy a day 5 days of the week, and can make measurable gains during the admission.  Patient will also benefit from the coordinated team approach during an Inpatient Acute Rehabilitation admission.  The patient will receive intensive therapy as well as Rehabilitation physician, nursing, social worker, and care management interventions.  Due to bladder management, bowel management, safety, skin/wound care, disease management, medication administration, pain management, and patient education the patient requires 24 hour a day rehabilitation nursing.  The patient is currently mod to max assist overall with mobility and basic ADLs.  Discharge setting and therapy post discharge at home with  home health is anticipated.  Patient has agreed to participate in the Acute Inpatient Rehabilitation Program and will admit today.  Preadmission Screen Completed By:  Cleatrice Burke, 03/17/2021 8:56 AM ______________________________________________________________________   Discussed status with Dr. Naaman Plummer on 03/17/2021 at 1042 and received approval for admission today.  Admission Coordinator:  Cleatrice Burke, RN, time  9471 Date 03/17/2021   Assessment/Plan: Diagnosis: SAH Does the need for close, 24 hr/day Medical supervision in concert with the patient's rehab needs make it unreasonable for this patient to be served in a less intensive setting? Yes Co-Morbidities requiring supervision/potential complications: DM, HTN, OSA, obesity Due to bladder management, bowel management, safety, skin/wound care, disease management, medication administration, pain management, and patient education, does the patient require 24 hr/day rehab nursing?  Yes Does the patient require coordinated care of a physician, rehab nurse, PT, OT, and SLP to address physical and functional deficits in the context of the above medical diagnosis(es)? Yes Addressing deficits in the following areas: balance, endurance, locomotion, strength, transferring, bowel/bladder control, bathing, dressing, feeding, grooming, toileting, cognition, and psychosocial support Can the patient actively participate in an intensive therapy program of at least 3 hrs of therapy 5 days a week? Yes The potential for patient to make measurable gains while on inpatient rehab is excellent Anticipated functional outcomes upon discharge from inpatient rehab: supervision and min assist PT, supervision and min assist OT, n/a SLP Estimated rehab length of stay to reach the above functional goals is: 10-14 days Anticipated discharge destination: Home 10. Overall Rehab/Functional Prognosis: excellent   MD Signature: Meredith Staggers, MD, [place] Director Rehabilitation Services 03/17/2021

## 2021-03-16 NOTE — Progress Notes (Signed)
Physical Therapy Treatment Patient Details Name: Joseph Ortega MRN: [id number] DOB: 10/02/1955 Today's Date: 03/16/2021   History of Present Illness Pt is a 65 y.o. male admitted 12/21 with dx of SAH. He presented to the ED via EMS from work with c/o L side weakness. 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 This hemorrhage has undergone subarachnoid extension and there is subarachnoid blood in the sulci of the right sylvian fissure and posterior frontal region. Follow up MRI also showed new R occipital infarct and enlargement of frontal infarct.   Pt also found to have shingles L anterior chest. PMH: DM2, HTN    PT Comments    Pt up in chair upon PT arrival to room, agreeable to session focused on standing balance and transfer training.  Pt requiring significant +2 assist for rise from low recliner into standing, but once in stedy pt requiring mod +1 for repeated standing. PT emphasized pt rising and sitting in midline, pt with preference for L lateral lean but corrects this with tactile and verbal cuing. PT changed plan back to CIR, pt's wife to hire caregiver assist when pt is ready to d/c home.     Recommendations for follow up therapy are one component of a multi-disciplinary discharge planning process, led by the attending physician.  Recommendations may be updated based on patient status, additional functional criteria and insurance authorization.  Follow Up Recommendations  Acute inpatient rehab (3hours/day)     Assistance Recommended at Discharge Frequent or constant Supervision/Assistance  Equipment Recommendations  Other (comment) (TBD)    Recommendations for Other Services       Precautions / Restrictions Precautions Precautions: Fall Precaution Comments: L weakness Restrictions Other Position/Activity Restrictions: off shingles precautions     Mobility  Bed Mobility Overal bed  mobility: Needs Assistance Bed Mobility: Rolling;Sit to Sidelying Rolling: Mod assist       Sit to sidelying: Mod assist General bed mobility comments: mod assist for trunk lowering, LE lifting into bed, and roll onto back. Pt able to boost self up with use of RUE and knees bent    Transfers Overall transfer level: Needs assistance Equipment used: Ambulation equipment used;None Transfers: Bed to chair/wheelchair/BSC;Sit to/from Stand Sit to Stand: Max assist;+2 physical assistance           General transfer comment: max +2 to stand from low recliner for power up, hip rise with bed pad, and steadying upon standing. Mod assist +1 to stand from elevated height of stedy. STS from stedy x4. Transfer via Lift Equipment: Stedy  Ambulation/Gait                   Stairs             Wheelchair Mobility    Modified Rankin (Stroke Patients Only) Modified Rankin (Stroke Patients Only) Pre-Morbid Rankin Score: No symptoms Modified Rankin: Severe disability     Balance Overall balance assessment: Needs assistance Sitting-balance support: Feet supported;Single extremity supported Sitting balance-Leahy Scale: Fair   Postural control: Left lateral lean Standing balance support: Bilateral upper extremity supported;During functional activity;Reliant on assistive device for balance Standing balance-Leahy Scale: Poor                              Cognition Arousal/Alertness: Awake/alert Behavior During Therapy: WFL for tasks assessed/performed Overall Cognitive Status: History of cognitive impairments - at baseline Area of  Impairment: Problem solving                             Problem Solving: Slow processing          Exercises      General Comments        Pertinent Vitals/Pain Pain Assessment: Faces Faces Pain Scale: No hurt Pain Intervention(s): Monitored during session    Home Living                          Prior  Function            PT Goals (current goals can now be found in the care plan section) Acute Rehab PT Goals Patient Stated Goal: regain strength and independence PT Goal Formulation: With patient Time For Goal Achievement: 03/24/21 Potential to Achieve Goals: Good Progress towards PT goals: Progressing toward goals    Frequency    Min 4X/week      PT Plan Discharge plan needs to be updated    Co-evaluation              AM-PAC PT "6 Clicks" Mobility   Outcome Measure  Help needed turning from your back to your side while in a flat bed without using bedrails?: A Lot Help needed moving from lying on your back to sitting on the side of a flat bed without using bedrails?: A Lot Help needed moving to and from a bed to a chair (including a wheelchair)?: A Lot Help needed standing up from a chair using your arms (e.g., wheelchair or bedside chair)?: A Lot Help needed to walk in hospital room?: Total Help needed climbing 3-5 steps with a railing? : Total 6 Click Score: 10    End of Session Equipment Utilized During Treatment: Gait belt;Other (comment) Activity Tolerance: Patient tolerated treatment well Patient left: in chair;with call bell/phone within reach;with chair alarm set Nurse Communication: Mobility status PT Visit Diagnosis: Other abnormalities of gait and mobility (R26.89);Hemiplegia and hemiparesis Hemiplegia - Right/Left: Left Hemiplegia - dominant/non-dominant: Non-dominant Hemiplegia - caused by: Nontraumatic SAH     Time: 1420-1445 PT Time Calculation (min) (ACUTE ONLY): 25 min  Charges:  $Therapeutic Activity: 8-22 mins $Neuromuscular Re-education: 8-22 mins                     Marye Round, PT DPT Acute Rehabilitation Services Pager [phone]  Office [phone]    Tyrone Apple E Christain [place] 03/16/2021, 3:02 PM

## 2021-03-17 ENCOUNTER — Inpatient Hospital Stay (HOSPITAL_COMMUNITY)
Admission: RE | Admit: 2021-03-17 | Discharge: 2021-04-18 | DRG: 057 | Disposition: A | Discharge status: Home Health | Payer: BC Managed Care – PPO | Source: Intra-hospital | Attending: Physical Medicine & Rehabilitation | Admitting: Physical Medicine & Rehabilitation

## 2021-03-17 ENCOUNTER — Encounter (HOSPITAL_COMMUNITY): Payer: Self-pay | Admitting: Physical Medicine & Rehabilitation

## 2021-03-17 ENCOUNTER — Other Ambulatory Visit: Payer: Self-pay

## 2021-03-17 DIAGNOSIS — I83228 Varicose veins of left lower extremity with both ulcer of other part of lower extremity and inflammation: Secondary | ICD-10-CM | POA: Diagnosis present

## 2021-03-17 DIAGNOSIS — E8809 Other disorders of plasma-protein metabolism, not elsewhere classified: Secondary | ICD-10-CM | POA: Diagnosis present

## 2021-03-17 DIAGNOSIS — E1142 Type 2 diabetes mellitus with diabetic polyneuropathy: Secondary | ICD-10-CM | POA: Diagnosis present

## 2021-03-17 DIAGNOSIS — Z741 Need for assistance with personal care: Secondary | ICD-10-CM | POA: Diagnosis present

## 2021-03-17 DIAGNOSIS — I611 Nontraumatic intracerebral hemorrhage in hemisphere, cortical: Secondary | ICD-10-CM | POA: Diagnosis not present

## 2021-03-17 DIAGNOSIS — I83218 Varicose veins of right lower extremity with both ulcer of other part of lower extremity and inflammation: Secondary | ICD-10-CM | POA: Diagnosis present

## 2021-03-17 DIAGNOSIS — I69054 Hemiplegia and hemiparesis following nontraumatic subarachnoid hemorrhage affecting left non-dominant side: Principal | ICD-10-CM

## 2021-03-17 DIAGNOSIS — E039 Hypothyroidism, unspecified: Secondary | ICD-10-CM | POA: Diagnosis present

## 2021-03-17 DIAGNOSIS — I619 Nontraumatic intracerebral hemorrhage, unspecified: Secondary | ICD-10-CM | POA: Diagnosis present

## 2021-03-17 DIAGNOSIS — R600 Localized edema: Secondary | ICD-10-CM | POA: Diagnosis present

## 2021-03-17 DIAGNOSIS — E1165 Type 2 diabetes mellitus with hyperglycemia: Secondary | ICD-10-CM | POA: Diagnosis present

## 2021-03-17 DIAGNOSIS — Z87891 Personal history of nicotine dependence: Secondary | ICD-10-CM | POA: Diagnosis not present

## 2021-03-17 DIAGNOSIS — E11319 Type 2 diabetes mellitus with unspecified diabetic retinopathy without macular edema: Secondary | ICD-10-CM | POA: Diagnosis present

## 2021-03-17 DIAGNOSIS — K3184 Gastroparesis: Secondary | ICD-10-CM | POA: Diagnosis not present

## 2021-03-17 DIAGNOSIS — Z6837 Body mass index (BMI) 37.0-37.9, adult: Secondary | ICD-10-CM | POA: Diagnosis not present

## 2021-03-17 DIAGNOSIS — R140 Abdominal distension (gaseous): Secondary | ICD-10-CM | POA: Diagnosis not present

## 2021-03-17 DIAGNOSIS — E669 Obesity, unspecified: Secondary | ICD-10-CM | POA: Diagnosis present

## 2021-03-17 DIAGNOSIS — F1010 Alcohol abuse, uncomplicated: Secondary | ICD-10-CM | POA: Diagnosis present

## 2021-03-17 DIAGNOSIS — E559 Vitamin D deficiency, unspecified: Secondary | ICD-10-CM | POA: Diagnosis present

## 2021-03-17 DIAGNOSIS — I10 Essential (primary) hypertension: Secondary | ICD-10-CM | POA: Diagnosis present

## 2021-03-17 DIAGNOSIS — Z716 Tobacco abuse counseling: Secondary | ICD-10-CM

## 2021-03-17 DIAGNOSIS — Z79890 Hormone replacement therapy (postmenopausal): Secondary | ICD-10-CM

## 2021-03-17 DIAGNOSIS — B029 Zoster without complications: Secondary | ICD-10-CM | POA: Diagnosis present

## 2021-03-17 DIAGNOSIS — M1711 Unilateral primary osteoarthritis, right knee: Secondary | ICD-10-CM | POA: Diagnosis present

## 2021-03-17 DIAGNOSIS — Z7985 Long-term (current) use of injectable non-insulin antidiabetic drugs: Secondary | ICD-10-CM

## 2021-03-17 DIAGNOSIS — G4733 Obstructive sleep apnea (adult) (pediatric): Secondary | ICD-10-CM | POA: Diagnosis present

## 2021-03-17 DIAGNOSIS — Z7141 Alcohol abuse counseling and surveillance of alcoholic: Secondary | ICD-10-CM

## 2021-03-17 DIAGNOSIS — E1169 Type 2 diabetes mellitus with other specified complication: Secondary | ICD-10-CM

## 2021-03-17 DIAGNOSIS — E876 Hypokalemia: Secondary | ICD-10-CM | POA: Diagnosis not present

## 2021-03-17 DIAGNOSIS — Z794 Long term (current) use of insulin: Secondary | ICD-10-CM

## 2021-03-17 DIAGNOSIS — E1143 Type 2 diabetes mellitus with diabetic autonomic (poly)neuropathy: Secondary | ICD-10-CM | POA: Diagnosis not present

## 2021-03-17 DIAGNOSIS — I878 Other specified disorders of veins: Secondary | ICD-10-CM | POA: Diagnosis present

## 2021-03-17 DIAGNOSIS — F32A Depression, unspecified: Secondary | ICD-10-CM | POA: Diagnosis present

## 2021-03-17 DIAGNOSIS — I69354 Hemiplegia and hemiparesis following cerebral infarction affecting left non-dominant side: Secondary | ICD-10-CM

## 2021-03-17 DIAGNOSIS — L97819 Non-pressure chronic ulcer of other part of right lower leg with unspecified severity: Secondary | ICD-10-CM | POA: Diagnosis present

## 2021-03-17 DIAGNOSIS — I609 Nontraumatic subarachnoid hemorrhage, unspecified: Secondary | ICD-10-CM | POA: Diagnosis not present

## 2021-03-17 DIAGNOSIS — Z7984 Long term (current) use of oral hypoglycemic drugs: Secondary | ICD-10-CM

## 2021-03-17 DIAGNOSIS — R252 Cramp and spasm: Secondary | ICD-10-CM | POA: Diagnosis not present

## 2021-03-17 DIAGNOSIS — R110 Nausea: Secondary | ICD-10-CM | POA: Diagnosis not present

## 2021-03-17 DIAGNOSIS — Z79899 Other long term (current) drug therapy: Secondary | ICD-10-CM | POA: Diagnosis not present

## 2021-03-17 DIAGNOSIS — I872 Venous insufficiency (chronic) (peripheral): Secondary | ICD-10-CM | POA: Diagnosis not present

## 2021-03-17 DIAGNOSIS — I610 Nontraumatic intracerebral hemorrhage in hemisphere, subcortical: Secondary | ICD-10-CM | POA: Diagnosis not present

## 2021-03-17 LAB — GLUCOSE, CAPILLARY
Glucose-Capillary: 200 mg/dL — ABNORMAL HIGH (ref 70–99)
Glucose-Capillary: 296 mg/dL — ABNORMAL HIGH (ref 70–99)
Glucose-Capillary: 254 mg/dL — ABNORMAL HIGH (ref 70–99)
Glucose-Capillary: 265 mg/dL — ABNORMAL HIGH (ref 70–99)

## 2021-03-17 MED ORDER — INSULIN ASPART 100 UNIT/ML IJ SOLN
0.0000 [IU] | Freq: Three times a day (TID) | INTRAMUSCULAR | Status: DC
  Administered 2021-03-17: 18:00:00 8 [IU] via SUBCUTANEOUS
  Administered 2021-03-18: 5 [IU] via SUBCUTANEOUS
  Administered 2021-03-18 – 2021-03-19 (×3): 3 [IU] via SUBCUTANEOUS
  Administered 2021-03-19 (×2): 5 [IU] via SUBCUTANEOUS
  Administered 2021-03-20: 3 [IU] via SUBCUTANEOUS
  Administered 2021-03-20 (×2): 5 [IU] via SUBCUTANEOUS
  Administered 2021-03-21: 3 [IU] via SUBCUTANEOUS
  Administered 2021-03-21: 2 [IU] via SUBCUTANEOUS
  Administered 2021-03-22: 3 [IU] via SUBCUTANEOUS
  Administered 2021-03-22: 5 [IU] via SUBCUTANEOUS
  Administered 2021-03-22: 2 [IU] via SUBCUTANEOUS
  Administered 2021-03-23 (×2): 3 [IU] via SUBCUTANEOUS
  Administered 2021-03-23: 2 [IU] via SUBCUTANEOUS
  Administered 2021-03-24: 5 [IU] via SUBCUTANEOUS
  Administered 2021-03-24 – 2021-03-26 (×4): 3 [IU] via SUBCUTANEOUS
  Administered 2021-03-26: 5 [IU] via SUBCUTANEOUS
  Administered 2021-03-26 – 2021-03-27 (×3): 3 [IU] via SUBCUTANEOUS
  Administered 2021-03-27: 2 [IU] via SUBCUTANEOUS
  Administered 2021-03-28: 3 [IU] via SUBCUTANEOUS
  Administered 2021-03-28: 5 [IU] via SUBCUTANEOUS
  Administered 2021-03-28 – 2021-03-29 (×2): 2 [IU] via SUBCUTANEOUS
  Administered 2021-03-29 (×2): 3 [IU] via SUBCUTANEOUS
  Administered 2021-03-30: 2 [IU] via SUBCUTANEOUS
  Administered 2021-03-30: 3 [IU] via SUBCUTANEOUS
  Administered 2021-03-31: 2 [IU] via SUBCUTANEOUS
  Administered 2021-04-01: 5 [IU] via SUBCUTANEOUS
  Administered 2021-04-01: 8 [IU] via SUBCUTANEOUS
  Administered 2021-04-01: 2 [IU] via SUBCUTANEOUS
  Administered 2021-04-02: 3 [IU] via SUBCUTANEOUS
  Administered 2021-04-02 – 2021-04-03 (×2): 2 [IU] via SUBCUTANEOUS
  Administered 2021-04-04: 3 [IU] via SUBCUTANEOUS
  Administered 2021-04-05: 2 [IU] via SUBCUTANEOUS
  Administered 2021-04-05: 3 [IU] via SUBCUTANEOUS
  Administered 2021-04-05: 2 [IU] via SUBCUTANEOUS
  Administered 2021-04-06: 15 [IU] via SUBCUTANEOUS
  Administered 2021-04-06: 3 [IU] via SUBCUTANEOUS
  Administered 2021-04-07: 2 [IU] via SUBCUTANEOUS
  Administered 2021-04-07: 3 [IU] via SUBCUTANEOUS
  Administered 2021-04-07: 11 [IU] via SUBCUTANEOUS
  Administered 2021-04-08: 2 [IU] via SUBCUTANEOUS
  Administered 2021-04-08: 3 [IU] via SUBCUTANEOUS
  Administered 2021-04-09: 2 [IU] via SUBCUTANEOUS
  Administered 2021-04-09 – 2021-04-11 (×4): 3 [IU] via SUBCUTANEOUS
  Administered 2021-04-12: 12:00:00 8 [IU] via SUBCUTANEOUS
  Administered 2021-04-12 – 2021-04-17 (×12): 2 [IU] via SUBCUTANEOUS

## 2021-03-17 MED ORDER — LEVOTHYROXINE SODIUM 25 MCG PO TABS
125.0000 ug | ORAL_TABLET | Freq: Every day | ORAL | Status: DC
  Administered 2021-03-18 – 2021-04-18 (×32): 125 ug via ORAL
  Filled 2021-03-17 (×32): qty 1

## 2021-03-17 MED ORDER — INSULIN DETEMIR 100 UNIT/ML ~~LOC~~ SOLN
35.0000 [IU] | Freq: Two times a day (BID) | SUBCUTANEOUS | Status: DC
  Administered 2021-03-17 – 2021-03-20 (×6): 35 [IU] via SUBCUTANEOUS
  Filled 2021-03-17 (×7): qty 0.35

## 2021-03-17 MED ORDER — ENOXAPARIN SODIUM 40 MG/0.4ML IJ SOSY: 40.0000 mg | PREFILLED_SYRINGE | INTRAMUSCULAR | Status: DC

## 2021-03-17 MED ORDER — PROCHLORPERAZINE MALEATE 5 MG PO TABS
5.0000 mg | ORAL_TABLET | Freq: Four times a day (QID) | ORAL | Status: DC | PRN
Start: 2021-03-17 — End: 2021-04-18
  Administered 2021-04-04: 10 mg via ORAL
  Filled 2021-03-17 (×2): qty 2

## 2021-03-17 MED ORDER — ACETAMINOPHEN 325 MG PO TABS
650.0000 mg | ORAL_TABLET | Freq: Four times a day (QID) | ORAL | Status: DC | PRN
  Administered 2021-03-18 – 2021-04-16 (×18): 650 mg via ORAL
  Filled 2021-03-17 (×19): qty 2

## 2021-03-17 MED ORDER — PROCHLORPERAZINE EDISYLATE 10 MG/2ML IJ SOLN
5.0000 mg | Freq: Four times a day (QID) | INTRAMUSCULAR | Status: DC | PRN
  Administered 2021-04-01: 10 mg via INTRAMUSCULAR

## 2021-03-17 MED ORDER — NAPHAZOLINE-GLYCERIN 0.012-0.25 % OP SOLN
1.0000 [drp] | Freq: Four times a day (QID) | OPHTHALMIC | Status: DC | PRN
Start: 2021-03-17 — End: 2021-04-18

## 2021-03-17 MED ORDER — ATORVASTATIN CALCIUM 40 MG PO TABS
40.0000 mg | ORAL_TABLET | Freq: Every day | ORAL | Status: DC
  Administered 2021-03-18 – 2021-04-18 (×32): 40 mg via ORAL
  Filled 2021-03-17 (×32): qty 1

## 2021-03-17 MED ORDER — DOCUSATE SODIUM 100 MG PO CAPS
100.0000 mg | ORAL_CAPSULE | Freq: Two times a day (BID) | ORAL | Status: DC
  Administered 2021-03-17 – 2021-04-16 (×24): 100 mg via ORAL
  Filled 2021-03-17 (×57): qty 1

## 2021-03-17 MED ORDER — POLYETHYLENE GLYCOL 3350 17 G PO PACK: 17.0000 g | PACK | Freq: Every day | ORAL | Status: DC | PRN

## 2021-03-17 MED ORDER — INSULIN DETEMIR 100 UNIT/ML ~~LOC~~ SOLN
35.0000 [IU] | Freq: Two times a day (BID) | SUBCUTANEOUS | Status: DC
  Filled 2021-03-17: qty 0.35

## 2021-03-17 MED ORDER — DOXAZOSIN MESYLATE 8 MG PO TABS
8.0000 mg | ORAL_TABLET | Freq: Every day | ORAL | Status: DC
  Administered 2021-03-18 – 2021-04-18 (×32): 8 mg via ORAL
  Filled 2021-03-17 (×32): qty 1

## 2021-03-17 MED ORDER — ALUM & MAG HYDROXIDE-SIMETH 200-200-20 MG/5ML PO SUSP: 30.0000 mL | ORAL | Status: DC | PRN

## 2021-03-17 MED ORDER — ENOXAPARIN SODIUM 40 MG/0.4ML IJ SOSY
40.0000 mg | PREFILLED_SYRINGE | INTRAMUSCULAR | Status: DC
  Administered 2021-03-18 – 2021-04-18 (×32): 40 mg via SUBCUTANEOUS
  Filled 2021-03-17 (×32): qty 0.4

## 2021-03-17 MED ORDER — DIPHENHYDRAMINE HCL 12.5 MG/5ML PO ELIX: 12.5000 mg | ORAL_SOLUTION | Freq: Four times a day (QID) | ORAL | Status: DC | PRN

## 2021-03-17 MED ORDER — VALACYCLOVIR HCL 500 MG PO TABS
1000.0000 mg | ORAL_TABLET | Freq: Three times a day (TID) | ORAL | Status: AC
  Administered 2021-03-17 – 2021-03-23 (×18): 1000 mg via ORAL
  Filled 2021-03-17 (×18): qty 2

## 2021-03-17 MED ORDER — FLEET ENEMA 7-19 GM/118ML RE ENEM: 1.0000 | ENEMA | Freq: Once | RECTAL | Status: DC | PRN

## 2021-03-17 MED ORDER — INSULIN ASPART 100 UNIT/ML IJ SOLN
0.0000 [IU] | Freq: Every day | INTRAMUSCULAR | Status: DC
  Administered 2021-03-17: 21:00:00 3 [IU] via SUBCUTANEOUS
  Administered 2021-03-19 – 2021-04-10 (×5): 2 [IU] via SUBCUTANEOUS
  Administered 2021-04-15: 5 [IU] via SUBCUTANEOUS

## 2021-03-17 MED ORDER — IRBESARTAN 300 MG PO TABS
300.0000 mg | ORAL_TABLET | Freq: Every day | ORAL | Status: DC
  Administered 2021-03-18 – 2021-04-18 (×32): 300 mg via ORAL
  Filled 2021-03-17 (×33): qty 1

## 2021-03-17 MED ORDER — INSULIN ASPART 100 UNIT/ML IJ SOLN
14.0000 [IU] | Freq: Three times a day (TID) | INTRAMUSCULAR | Status: DC
  Administered 2021-03-17: 12:00:00 14 [IU] via SUBCUTANEOUS

## 2021-03-17 MED ORDER — PROCHLORPERAZINE 25 MG RE SUPP
12.5000 mg | Freq: Four times a day (QID) | RECTAL | Status: DC | PRN
  Filled 2021-03-17: qty 1

## 2021-03-17 MED ORDER — SORBITOL 70 % SOLN: 30.0000 mL | Freq: Every day | Status: DC | PRN

## 2021-03-17 MED ORDER — LIVING WELL WITH DIABETES BOOK
Freq: Once | Status: AC
  Filled 2021-03-17: qty 1

## 2021-03-17 MED ORDER — ASPIRIN EC 81 MG PO TBEC
81.0000 mg | DELAYED_RELEASE_TABLET | Freq: Every day | ORAL | Status: DC
  Administered 2021-03-18 – 2021-04-18 (×32): 81 mg via ORAL
  Filled 2021-03-17 (×32): qty 1

## 2021-03-17 MED ORDER — INSULIN ASPART 100 UNIT/ML IJ SOLN
14.0000 [IU] | Freq: Three times a day (TID) | INTRAMUSCULAR | Status: DC
  Administered 2021-03-17 – 2021-04-18 (×81): 14 [IU] via SUBCUTANEOUS

## 2021-03-17 MED ORDER — GUAIFENESIN-DM 100-10 MG/5ML PO SYRP
5.0000 mL | ORAL_SOLUTION | Freq: Four times a day (QID) | ORAL | Status: DC | PRN
Start: 2021-03-17 — End: 2021-04-18
  Administered 2021-03-29 – 2021-04-02 (×3): 10 mL via ORAL
  Filled 2021-03-17 (×3): qty 10

## 2021-03-17 MED ORDER — BLOOD PRESSURE CONTROL BOOK
Freq: Once | Status: AC
  Filled 2021-03-17: qty 1

## 2021-03-17 MED ORDER — DULOXETINE HCL 20 MG PO CPEP
20.0000 mg | ORAL_CAPSULE | Freq: Two times a day (BID) | ORAL | Status: DC
  Administered 2021-03-17 – 2021-04-18 (×64): 20 mg via ORAL
  Filled 2021-03-17 (×64): qty 1

## 2021-03-17 MED ORDER — TRAZODONE HCL 50 MG PO TABS
50.0000 mg | ORAL_TABLET | Freq: Every day | ORAL | Status: DC
  Administered 2021-03-17 – 2021-04-17 (×31): 50 mg via ORAL
  Filled 2021-03-17 (×32): qty 1

## 2021-03-17 MED ORDER — MIRTAZAPINE 15 MG PO TABS
7.5000 mg | ORAL_TABLET | Freq: Every day | ORAL | Status: DC
  Administered 2021-03-17 – 2021-04-17 (×27): 7.5 mg via ORAL
  Filled 2021-03-17 (×32): qty 1

## 2021-03-17 MED ORDER — CARVEDILOL 6.25 MG PO TABS
6.2500 mg | ORAL_TABLET | Freq: Two times a day (BID) | ORAL | Status: DC
  Administered 2021-03-17 – 2021-03-23 (×12): 6.25 mg via ORAL
  Filled 2021-03-17 (×12): qty 1

## 2021-03-17 MED ORDER — LIDOCAINE 5 % EX PTCH
1.0000 | MEDICATED_PATCH | CUTANEOUS | Status: DC
  Administered 2021-03-17 – 2021-04-17 (×21): 1 via TRANSDERMAL
  Filled 2021-03-17 (×31): qty 1

## 2021-03-17 NOTE — Progress Notes (Signed)
Orthopedic Tech Progress Note Patient Details:  [name] September 15, 1955 [id number] Called in order to [employer] for PRAFO/WHO Patient ID: Joseph Ortega, male   DOB: 02/23/1956, 65 y.o.   MRN: [id number]  Joseph Ortega 03/17/2021, 2:27 PM

## 2021-03-17 NOTE — H&P (Signed)
Physical Medicine and Rehabilitation Admission H&P    Cc: Functional deficits due to right hemispheric subarachnoid hemorrhage  HPI: Joseph Ortega is a 65 year old right-handed male who was in his usual state of health until March 08, 2021 when he presented to [facility] emergency department complaining of left arm and leg weakness.  Code stroke was initiated.  Neurology was consulted.  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  He was transferred to [facility].  He was noted to have a left anterior chest rash consistent with herpes zoster.  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  He was admitted to the neuro ICU and placed on Cleviprex and Keppra.  Neurosurgery was consulted.  On 12/22 he underwent four-vessel cerebral arteriogram by Dr. Estanislado Pandy.  There was no evidence of DAVF, AV shunting, AVM, aneurysms or of dissection.  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  He was hemodynamically stable. He underwent speech language pathology evaluation and was started on regular diet.  A repeat CT of the head on 12/25 was stable and aspirin therapy was initiated.  He underwent bilateral lower extremity venous Doppler study on 12/25 negative for DVT.  He underwent CTA of the neck the following day and was unchanged from prior.  Left upper quadrantanopia improved; lower quadrantanopia persisted.  Blood pressure remained controlled on valsartan and metoprolol.  He was continued on his home BiPAP with history of obstructive sleep apnea.  Keppra continues.  Patient will need 30-day event monitor.  Diabetes coordinator was consulted for assistance with blood glucoses and insulin dosing. Patient was overall requiring mod physical assist for bed mobility and +2 for transfer. The patient requires inpatient physical  medicine and rehabilitation evaluations and treatment secondary to dysfunction due to hemispheric subarachnoid hemorrhage, right occipital lobe infarction and enlargement of posterior frontal infarction.   Lives at home in [place] with wife wife who is disabled secondary to permanent muscle weakness following lengthy hospitalization.  Review of Systems  Constitutional:  Negative for chills and fever.  Eyes:  Negative for blurred vision and double vision.  Respiratory:  Negative for cough and shortness of breath.   Cardiovascular:  Negative for chest pain.  Gastrointestinal:  Negative for abdominal pain, constipation, nausea and vomiting.  Genitourinary:  Negative for dysuria, frequency and urgency.  Skin:  Positive for rash.  Neurological:  Positive for focal weakness. Negative for dizziness.  Past Medical History:  Diagnosis Date   Diabetes mellitus without complication ([place])    Hypertension    Thyroid disease    Past Surgical History:  Procedure Laterality Date   HERNIA REPAIR     IR ANGIO INTRA EXTRACRAN SEL COM CAROTID INNOMINATE BILAT MOD SED  03/09/2021   IR ANGIO VERTEBRAL SEL VERTEBRAL BILAT MOD SED  03/09/2021   IR US GUIDE VASC ACCESS RIGHT  03/09/2021   No family history on file. Social History:  reports that he has never smoked. He has never used smokeless tobacco. He reports that he does not drink alcohol and does not use drugs. Allergies: No Known Allergies Medications Prior to Admission  Medication Sig Dispense Refill   doxazosin (CARDURA) 8 MG tablet Take 1 tablet by mouth daily.     DULoxetine (CYMBALTA) 20 MG capsule Take 20 mg by mouth 2 (two) times daily.     FARXIGA 10 MG TABS tablet Take  10 mg by mouth daily.     fluticasone (FLONASE) 50 MCG/ACT nasal spray Place 1 spray into both nostrils daily.     gabapentin (NEURONTIN) 300 MG capsule Take 300 mg by mouth 2 (two) times daily.     insulin detemir (LEVEMIR) 100 UNIT/ML injection Inject 50 Units  into the skin daily.      levothyroxine (SYNTHROID, LEVOTHROID) 125 MCG tablet Take 125 mcg by mouth daily before breakfast.     metoprolol tartrate (LOPRESSOR) 50 MG tablet Take 50 mg by mouth daily.     mirtazapine (REMERON) 15 MG tablet Take 7.5 mg by mouth at bedtime.     NOVOLOG FLEXPEN 100 UNIT/ML FlexPen Inject 10 Units into the skin in the morning, at noon, and at bedtime. Sliding scale     traZODone (DESYREL) 50 MG tablet Take 50 mg by mouth at bedtime.     TRULICITY 3 MG/0.5ML SOPN Inject 3 mg into the skin every Thursday.     valsartan (DIOVAN) 320 MG tablet Take 1 tablet by mouth daily.     Vitamin D, Ergocalciferol, (DRISDOL) 50000 UNITS CAPS capsule Take 50,000 Units by mouth every Sunday. sunday       Drug Regimen Review  Drug regimen was reviewed and remains appropriate with no significant issues identified  Home: Home Living Family/patient expects to be discharged to:: Private residence Living Arrangements: Spouse/significant other Available Help at Discharge: Family, Available 24 hours/day (wife will hire caregivers) Type of Home: House Home Access: Ramped entrance Home Layout: One level Bathroom Shower/Tub: Health visitor: Handicapped height Bathroom Accessibility: Yes Home Equipment: Shower seat - built in, [employer] - tub/shower Additional Comments: Pt's wife uses RW adn wheelchair for mobility  Lives With: Spouse   Functional History: Prior Function Prior Level of Function : Independent/Modified Independent, Working/employed, Driving  Functional Status:  Mobility: Bed Mobility Overal bed mobility: Needs Assistance Bed Mobility: Rolling, Sit to Sidelying Rolling: Mod assist Sidelying to sit: Mod assist, HOB elevated Supine to sit: Mod assist, HOB elevated Sit to supine: Max assist, HOB elevated Sit to sidelying: Mod assist General bed mobility comments: mod assist for trunk lowering, LE lifting into bed, and roll onto back. Pt able to  boost self up with use of RUE and knees bent Transfers Overall transfer level: Needs assistance Equipment used: Ambulation equipment used, None Transfers: Bed to chair/wheelchair/BSC, Sit to/from Stand Sit to Stand: Max assist, +2 physical assistance Bed to/from chair/wheelchair/BSC transfer type:: Squat pivot Squat pivot transfers: Max assist  Lateral/Scoot Transfers: Mod assist, +2 physical assistance Transfer via Lift Equipment: Stedy General transfer comment: max +2 to stand from low recliner for power up, hip rise with bed pad, and steadying upon standing. Mod assist +1 to stand from elevated height of stedy. STS from stedy x4. Ambulation/Gait General Gait Details: unable Pre-gait activities: wt shifting in standing in stedy with mod A +2    ADL: ADL Overall ADL's : Needs assistance/impaired Eating/Feeding: Set up, Bed level Grooming: Wash/dry hands, Wash/dry face, Set up, Bed level Upper Body Bathing: Moderate assistance, Bed level Lower Body Bathing: Maximal assistance, Bed level Upper Body Dressing : Maximal assistance, Bed level Lower Body Dressing: Total assistance, Bed level Toilet Transfer: Moderate assistance, +2 for physical assistance, +2 for safety/equipment (used stedy) Toilet Transfer Details (indicate cue type and reason): mod assist +2 to use stedy for transfer to [facility] Toileting- Clothing Manipulation and Hygiene: Total assistance, Bed level Toileting - Clothing Manipulation Details (indicate cue type and reason):  patient rolled to left to assist with toilet hygiene General ADL Comments: Patient on bed pan when therapist arrived and assisted with cleaning by rolling to left using rail  Cognition: Cognition Overall Cognitive Status: History of cognitive impairments - at baseline Arousal/Alertness: Awake/alert Orientation Level: Oriented X4 Attention: Divided Divided Attention: Appears intact Memory: Impaired Memory Impairment: Retrieval deficit Awareness:  Appears intact Problem Solving: Appears intact Cognition Arousal/Alertness: Awake/alert Behavior During Therapy: WFL for tasks assessed/performed Overall Cognitive Status: History of cognitive impairments - at baseline Area of Impairment: Problem solving Problem Solving: Slow processing General Comments: followed commands, aware of deficits  Physical Exam: Blood pressure (!) 150/75, pulse 71, temperature 98.6 F (37 C), temperature source Oral, resp. rate 18, weight 129 kg, SpO2 96 %. Physical Exam Constitutional:      Appearance: He is obese. He is not ill-appearing.  HENT:     Head: Normocephalic and atraumatic.     Right Ear: External ear normal.     Left Ear: External ear normal.     Mouth/Throat:     Mouth: Mucous membranes are moist.  Eyes:     Conjunctiva/sclera: Conjunctivae normal.  Cardiovascular:     Rate and Rhythm: Normal rate and regular rhythm.     Heart sounds: No murmur heard.   No gallop.  Pulmonary:     Effort: Pulmonary effort is normal. No respiratory distress.     Breath sounds: No wheezing or rhonchi.  Abdominal:     General: Abdomen is flat. Bowel sounds are normal. There is no distension.     Palpations: Abdomen is soft.     Tenderness: There is no abdominal tenderness.  Musculoskeletal:     Right lower leg: Edema present.     Left lower leg: Edema present.     Comments: Unable to lift left arm against gravity.  Can raise left lower extremity approxi-1 inch off mattress.    Skin:    Findings: Rash present.     Comments: Pink, maculopapular rash at inferior skin fold of left breast/anterior chest without signs of infection. Bilateral Lower leg skin darkening consistent with venous stasis.  Approxi-1 cm right lower leg and left lower leg superficial skin ulceration without signs of infection. Foam dressing on each wound  Neurological:     Mental Status: He is alert.     Comments: Alert and oriented x 3. Normal insight and awareness. Intact Memory.  Normal language and speech. Cranial nerve exam unremarkable except for LLQ quadrantanopsia. LUE 1+ pec, biceps and 0/5 wrist and hand. LLE 2/5 HE, KE and 1/5 PF, 0/5 DF. RUE and RLE grossly 5/5. No focal sensory abnl  Psychiatric:        Mood and Affect: Mood normal.        Behavior: Behavior normal.    Results for orders placed or performed during the hospital encounter of 03/08/21 (from the past 48 hour(s))  Glucose, capillary     Status: Abnormal   Collection Time: 03/15/21  4:48 PM  Result Value Ref Range   Glucose-Capillary 241 (H) 70 - 99 mg/dL    Comment: Glucose reference range applies only to samples taken after fasting for at least 8 hours.  Glucose, capillary     Status: Abnormal   Collection Time: 03/15/21  9:14 PM  Result Value Ref Range   Glucose-Capillary 239 (H) 70 - 99 mg/dL    Comment: Glucose reference range applies only to samples taken after fasting for at least 8 hours.  Glucose,  capillary     Status: Abnormal   Collection Time: 03/16/21  8:09 AM  Result Value Ref Range   Glucose-Capillary 229 (H) 70 - 99 mg/dL    Comment: Glucose reference range applies only to samples taken after fasting for at least 8 hours.  Glucose, capillary     Status: Abnormal   Collection Time: 03/16/21 11:21 AM  Result Value Ref Range   Glucose-Capillary 268 (H) 70 - 99 mg/dL    Comment: Glucose reference range applies only to samples taken after fasting for at least 8 hours.  Glucose, capillary     Status: Abnormal   Collection Time: 03/16/21  4:06 PM  Result Value Ref Range   Glucose-Capillary 248 (H) 70 - 99 mg/dL    Comment: Glucose reference range applies only to samples taken after fasting for at least 8 hours.  Glucose, capillary     Status: Abnormal   Collection Time: 03/16/21  9:27 PM  Result Value Ref Range   Glucose-Capillary 228 (H) 70 - 99 mg/dL    Comment: Glucose reference range applies only to samples taken after fasting for at least 8 hours.   Comment 1 Notify RN     Comment 2 Document in Chart   Glucose, capillary     Status: Abnormal   Collection Time: 03/17/21  7:41 AM  Result Value Ref Range   Glucose-Capillary 200 (H) 70 - 99 mg/dL    Comment: Glucose reference range applies only to samples taken after fasting for at least 8 hours.   No results found.     Medical Problem List and Plan: 1. 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 ?embolic. 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  -30 day outpatient cardiac event monitor recommended to r/o a fib   -patient may  shower  -ELOS/Goals: 10-14 days, supervision to min assist goals with PT and OT 2.  Antithrombotics: -DVT/anticoagulation:  Mechanical: Sequential compression devices, below knee Bilateral lower extremities Pharmaceutical: Lovenox  -antiplatelet therapy: aspirin 81 mg 3. Pain Management: Tylenol 4. Mood: LCSW to evaluate and provide emotional support  -antipsychotic agents: n/a 5. Neuropsych: This patient is capable of making decisions on his own behalf. 6. Skin/Wound Care: routine skin checks.  --Monitor BLE ulcerations. 7. Fluids/Electrolytes/Nutrition: Routine Is and Os and follow chemistries 8. Hypertension: continue valsartan and metoprolol 9. 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 Hgb A1c = 11.1  -monitor cbg's, adjust regimen as needed 10: Vitamin D deficiency 11: Hypothyroidism: continue Synthroid and recheck labs in 6 weeks 12: Peripheral neuropathy: on gabapentin 300 mg twice daily at home 13: Herpes zoster of chest: continue Valtrex for total of 2-3 weeks 14: OSA: home BiPAP, 2L O2  at night and naps 15: History of depression: Cymbalta listed on home meds 16: Tobacco use: Offer nicotine patch and provide cessation counseling 17: EtOH use: Provide counseling 18: Obesity: Dietary consultation 19:  Venous insufficiency, bilateral lower extremities: Has had treatment for venous stasis ulcers including Unna boots and follow-up at [facility] wound care center.  Currently with small superficial ulcerations bilateral lower legs.  He routinely wears compression stockings.      Barbie Banner, PA-C 03/17/2021

## 2021-03-17 NOTE — Progress Notes (Signed)
Inpatient Rehabilitation Admissions Coordinator   I have insurance approval and CIR bed to admit him to today. I spoke with both patient and his wife by phone and they are in agreement to admit today. I have alerted acute team and TOC. I will make the arrangements to admit today.  Ottie Glazier, RN, MSN Rehab Admissions Coordinator [phone] 03/17/2021 8:54 AM

## 2021-03-17 NOTE — Progress Notes (Signed)
Patient ID: Joseph Ortega, male   DOB: 1955/06/13, 65 y.o.   MRN: [id number]  Met with patient in room. Introduced myself and explained my role in his care while on rehab. Explained the educational handouts that I was including in his [employer] and explained that this book is his and to take home at discharge. Included in the book are handouts including but not limited to, living with diabetes, hypoglycemia, insulin injection instructions using insulin pens, type 2 diabetes mellitus self-care, hypertension, heart heathy eating plan, DASH eating plan, high cholesterol, preventing high cholesterol, living with sleep apnea, sleep apnea, and hypothyroidism. I have also ordered the diabetes and HTN books from the pharmacy. Patient was resting in bed and I helped him locate a TV channel he couldn't find and emptied his urinal. I will continue to monitor patient's progress.  Dorthula Nettles, RN3, BSN, CBIS, [place], [facility], Inpatient Rehabilitation Office [phone] Cell [phone]

## 2021-03-17 NOTE — H&P (Signed)
Physical Medicine and Rehabilitation Admission H&P     Cc: Functional deficits due to right hemispheric subarachnoid hemorrhage   HPI: Joseph Ortega is a 65 year old right-handed male who was in his usual state of health until March 08, 2021 when he presented to [facility] emergency department complaining of left arm and leg weakness.  Code stroke was initiated.  Neurology was consulted.  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  He was transferred to [facility].  He was noted to have a left anterior chest rash consistent with herpes zoster.  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  He was admitted to the neuro ICU and placed on Cleviprex and Keppra.  Neurosurgery was consulted.  On 12/22 he underwent four-vessel cerebral arteriogram by Dr. Corliss Skains.  There was no evidence of DAVF, AV shunting, AVM, aneurysms or of dissection.  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  He was hemodynamically stable. He underwent speech language pathology evaluation and was started on regular diet.  A repeat CT of the head on 12/25 was stable and aspirin therapy was initiated.  He underwent bilateral lower extremity venous Doppler study on 12/25 negative for DVT.  He underwent CTA of the neck the following day and was unchanged from prior.  Left upper quadrantanopia improved; lower quadrantanopia persisted.  Blood pressure remained controlled on valsartan and metoprolol.  He was continued on his home BiPAP with history of obstructive sleep apnea.  Keppra continues.  Patient will need 30-day event monitor.  Diabetes coordinator was consulted for assistance with blood glucoses and insulin dosing. Patient was overall requiring mod physical assist for bed mobility and +2 for transfer. The patient requires inpatient physical  medicine and rehabilitation evaluations and treatment secondary to dysfunction due to hemispheric subarachnoid hemorrhage, right occipital lobe infarction and enlargement of posterior frontal infarction.     Lives at home in single-story house with wife wife who is disabled secondary to permanent muscle weakness following lengthy hospitalization.   Review of Systems  Constitutional:  Negative for chills and fever.  Eyes:  Negative for blurred vision and double vision.  Respiratory:  Negative for cough and shortness of breath.   Cardiovascular:  Negative for chest pain.  Gastrointestinal:  Negative for abdominal pain, constipation, nausea and vomiting.  Genitourinary:  Negative for dysuria, frequency and urgency.  Skin:  Positive for rash.  Neurological:  Positive for focal weakness. Negative for dizziness.      Past Medical History:  Diagnosis Date   Diabetes mellitus without complication (HCC)     Hypertension     Thyroid disease           Past Surgical History:  Procedure Laterality Date   HERNIA REPAIR       IR ANGIO INTRA EXTRACRAN SEL COM CAROTID INNOMINATE BILAT MOD SED   03/09/2021   IR ANGIO VERTEBRAL SEL VERTEBRAL BILAT MOD SED   03/09/2021   IR US GUIDE VASC ACCESS RIGHT   03/09/2021    No family history on file. Social History:  reports that he has never smoked. He has never used smokeless tobacco. He reports that he does not drink alcohol and does not use drugs. Allergies: No Known Allergies       Medications Prior to Admission  Medication Sig Dispense Refill   doxazosin (CARDURA) 8 MG tablet Take 1 tablet by  mouth daily.       DULoxetine (CYMBALTA) 20 MG capsule Take 20 mg by mouth 2 (two) times daily.       FARXIGA 10 MG TABS tablet Take 10 mg by mouth daily.       fluticasone (FLONASE) 50 MCG/ACT nasal spray Place 1 spray into both nostrils daily.       gabapentin (NEURONTIN) 300 MG capsule Take 300 mg by mouth 2 (two) times daily.       insulin detemir  (LEVEMIR) 100 UNIT/ML injection Inject 50 Units into the skin daily.        levothyroxine (SYNTHROID, LEVOTHROID) 125 MCG tablet Take 125 mcg by mouth daily before breakfast.       metoprolol tartrate (LOPRESSOR) 50 MG tablet Take 50 mg by mouth daily.       mirtazapine (REMERON) 15 MG tablet Take 7.5 mg by mouth at bedtime.       NOVOLOG FLEXPEN 100 UNIT/ML FlexPen Inject 10 Units into the skin in the morning, at noon, and at bedtime. Sliding scale       traZODone (DESYREL) 50 MG tablet Take 50 mg by mouth at bedtime.       TRULICITY 3 0000000 SOPN Inject 3 mg into the skin every Thursday.       valsartan (DIOVAN) 320 MG tablet Take 1 tablet by mouth daily.       Vitamin D, Ergocalciferol, (DRISDOL) 50000 UNITS CAPS capsule Take 50,000 Units by mouth every Sunday. sunday           Drug Regimen Review  Drug regimen was reviewed and remains appropriate with no significant issues identified   Home: Home Living Family/patient expects to be discharged to:: Private residence Living Arrangements: Spouse/significant other Available Help at Discharge: Family, Available 24 hours/day (wife will hire caregivers) Type of Home: House Home Access: [place]: One level Bathroom Shower/Tub: Multimedia programmer: Handicapped height Bathroom Accessibility: Yes Home Equipment: Shower seat - built in, [employer] - tub/shower Additional Comments: Pt's wife uses RW adn wheelchair for mobility  Lives With: Spouse   Functional History: Prior Function Prior Level of Function : Independent/Modified Independent, Working/employed, Driving   Functional Status:  Mobility: Bed Mobility Overal bed mobility: Needs Assistance Bed Mobility: Rolling, Sit to Sidelying Rolling: Mod assist Sidelying to sit: Mod assist, HOB elevated Supine to sit: Mod assist, HOB elevated Sit to supine: Max assist, HOB elevated Sit to sidelying: Mod assist General bed mobility comments: mod assist for  trunk lowering, LE lifting into bed, and roll onto back. Pt able to boost self up with use of RUE and knees bent Transfers Overall transfer level: Needs assistance Equipment used: Ambulation equipment used, None Transfers: Bed to chair/wheelchair/BSC, Sit to/from Stand Sit to Stand: Max assist, +2 physical assistance Bed to/from chair/wheelchair/BSC transfer type:: Squat pivot Squat pivot transfers: Max assist  Lateral/Scoot Transfers: Mod assist, +2 physical assistance Transfer via Lift Equipment: Stedy General transfer comment: max +2 to stand from low recliner for power up, hip rise with bed pad, and steadying upon standing. Mod assist +1 to stand from elevated height of stedy. STS from stedy x4. Ambulation/Gait General Gait Details: unable Pre-gait activities: wt shifting in standing in stedy with mod A +2   ADL: ADL Overall ADL's : Needs assistance/impaired Eating/Feeding: Set up, Bed level Grooming: Wash/dry hands, Wash/dry face, Set up, Bed level Upper Body Bathing: Moderate assistance, Bed level Lower Body Bathing: Maximal assistance, Bed level Upper Body Dressing :  Maximal assistance, Bed level Lower Body Dressing: Total assistance, Bed level Toilet Transfer: Moderate assistance, +2 for physical assistance, +2 for safety/equipment (used stedy) Toilet Transfer Details (indicate cue type and reason): mod assist +2 to use stedy for transfer to [facility] Toileting- Clothing Manipulation and Hygiene: Total assistance, Bed level Toileting - Clothing Manipulation Details (indicate cue type and reason): patient rolled to left to assist with toilet hygiene General ADL Comments: Patient on bed pan when therapist arrived and assisted with cleaning by rolling to left using rail   Cognition: Cognition Overall Cognitive Status: History of cognitive impairments - at baseline Arousal/Alertness: Awake/alert Orientation Level: Oriented X4 Attention: Divided Divided Attention: Appears  intact Memory: Impaired Memory Impairment: Retrieval deficit Awareness: Appears intact Problem Solving: Appears intact Cognition Arousal/Alertness: Awake/alert Behavior During Therapy: WFL for tasks assessed/performed Overall Cognitive Status: History of cognitive impairments - at baseline Area of Impairment: Problem solving Problem Solving: Slow processing General Comments: followed commands, aware of deficits   Physical Exam: Blood pressure (!) 150/75, pulse 71, temperature 98.6 F (37 C), temperature source Oral, resp. rate 18, weight 129 kg, SpO2 96 %. Physical Exam Constitutional:      Appearance: He is obese. He is not ill-appearing.  HENT:     Head: Normocephalic and atraumatic.     Right Ear: External ear normal.     Left Ear: External ear normal.     Mouth/Throat:     Mouth: Mucous membranes are moist.  Eyes:     Conjunctiva/sclera: Conjunctivae normal.  Cardiovascular:     Rate and Rhythm: Normal rate and regular rhythm.     Heart sounds: No murmur heard.   No gallop.  Pulmonary:     Effort: Pulmonary effort is normal. No respiratory distress.     Breath sounds: No wheezing or rhonchi.  Abdominal:     General: Abdomen is flat. Bowel sounds are normal. There is no distension.     Palpations: Abdomen is soft.     Tenderness: There is no abdominal tenderness.  Musculoskeletal:     Right lower leg: Edema present.     Left lower leg: Edema present.     Comments: Unable to lift left arm against gravity.  Can raise left lower extremity approxi-1 inch off mattress.    Skin:    Findings: Rash present.     Comments: Pink, maculopapular rash at inferior skin fold of left breast/anterior chest without signs of infection. Bilateral Lower leg skin darkening consistent with venous stasis.  Approxi-1 cm right lower leg and left lower leg superficial skin ulceration without signs of infection. Foam dressing on each wound  Neurological:     Mental Status: He is alert.      Comments: Alert and oriented x 3. Normal insight and awareness. Intact Memory. Normal language and speech. Cranial nerve exam unremarkable except for LLQ quadrantanopsia. LUE 1+ pec, biceps and 0/5 wrist and hand. LLE 2/5 HE, KE and 1/5 PF, 0/5 DF. RUE and RLE grossly 5/5. No focal sensory abnl  Psychiatric:        Mood and Affect: Mood normal.        Behavior: Behavior normal.      Lab Results Last 48 Hours        Results for orders placed or performed during the hospital encounter of 03/08/21 (from the past 48 hour(s))  Glucose, capillary     Status: Abnormal    Collection Time: 03/15/21  4:48 PM  Result Value Ref Range  Glucose-Capillary 241 (H) 70 - 99 mg/dL      Comment: Glucose reference range applies only to samples taken after fasting for at least 8 hours.  Glucose, capillary     Status: Abnormal    Collection Time: 03/15/21  9:14 PM  Result Value Ref Range    Glucose-Capillary 239 (H) 70 - 99 mg/dL      Comment: Glucose reference range applies only to samples taken after fasting for at least 8 hours.  Glucose, capillary     Status: Abnormal    Collection Time: 03/16/21  8:09 AM  Result Value Ref Range    Glucose-Capillary 229 (H) 70 - 99 mg/dL      Comment: Glucose reference range applies only to samples taken after fasting for at least 8 hours.  Glucose, capillary     Status: Abnormal    Collection Time: 03/16/21 11:21 AM  Result Value Ref Range    Glucose-Capillary 268 (H) 70 - 99 mg/dL      Comment: Glucose reference range applies only to samples taken after fasting for at least 8 hours.  Glucose, capillary     Status: Abnormal    Collection Time: 03/16/21  4:06 PM  Result Value Ref Range    Glucose-Capillary 248 (H) 70 - 99 mg/dL      Comment: Glucose reference range applies only to samples taken after fasting for at least 8 hours.  Glucose, capillary     Status: Abnormal    Collection Time: 03/16/21  9:27 PM  Result Value Ref Range    Glucose-Capillary 228 (H) 70  - 99 mg/dL      Comment: Glucose reference range applies only to samples taken after fasting for at least 8 hours.    Comment 1 Notify RN      Comment 2 Document in Chart    Glucose, capillary     Status: Abnormal    Collection Time: 03/17/21  7:41 AM  Result Value Ref Range    Glucose-Capillary 200 (H) 70 - 99 mg/dL      Comment: Glucose reference range applies only to samples taken after fasting for at least 8 hours.      Imaging Results (Last 48 hours)  No results found.           Medical Problem List and Plan: 1. 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 ?embolic. 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             -30 day outpatient cardiac event monitor recommended to r/o a fib              -patient may  shower             -ELOS/Goals: 10-14 days, supervision to min assist goals with PT and OT 2.  Antithrombotics: -DVT/anticoagulation:  Mechanical: Sequential compression devices, below knee Bilateral lower extremities Pharmaceutical: Lovenox             -antiplatelet therapy: aspirin 81 mg 3. Pain Management: Tylenol 4. Mood: LCSW to evaluate and provide emotional support             -antipsychotic agents: n/a 5. Neuropsych: This patient is capable of making decisions on his own behalf. 6. Skin/Wound Care: routine skin checks.  --Monitor BLE ulcerations. 7. Fluids/Electrolytes/Nutrition: Routine Is and Os and follow chemistries 8. Hypertension: continue valsartan and metoprolol 9. DM-insulin requiring: SSI goal <180,  Levemir 30u BID started. Hgb A1c = 11.1             -monitor cbg's, adjust regimen as needed 10: Vitamin D deficiency 11: Hypothyroidism: continue Synthroid and recheck labs in 6 weeks 12: Peripheral neuropathy: on gabapentin 300 mg twice daily at home 13: Herpes zoster of chest: continue Valtrex for total of 2-3  weeks 14: OSA: home BiPAP, 2L O2  at night and naps 15: History of depression: Cymbalta listed on home meds 16: Tobacco use: Offer nicotine patch and provide cessation counseling 17: EtOH use: Provide counseling 18: Obesity: Dietary consultation 19: Venous insufficiency, bilateral lower extremities: Has had treatment for venous stasis ulcers including Unna boots and follow-up at [facility] wound care center.  Currently with small superficial ulcerations bilateral lower legs.  He routinely wears compression stockings.       Barbie Banner, PA-C 03/17/2021   I have personally performed a face to face diagnostic evaluation of this patient and formulated the key components of the plan.  Additionally, I have personally reviewed laboratory data, imaging studies, as well as relevant notes and concur with the physician assistant's documentation above.  The patient's status has not changed from the original H&P.  Any changes in documentation from the acute care chart have been noted above.  Meredith Staggers, MD, [facility]rown

## 2021-03-17 NOTE — Progress Notes (Signed)
Meredith Staggers, MD  Physician Physical Medicine and Rehabilitation PMR Pre-admission    Signed Date of Service:  03/16/2021  4:08 PM  Related encounter: Admission (Current) from 03/08/2021 in [place]      Show:Clear all '[x]' Written'[x]' Templated'[]' Copied  Added by: '[x]' Cristina Gong, RN'[x]' Meredith Staggers, MD  '[]' Hover for details                                                                                                                                                                                                                                                                                                                                                                                                                                                             PMR Admission Coordinator Pre-Admission Assessment   Patient: Joseph Ortega is an 65 y.o., male MRN: [id number] DOB: 1955-08-10 Height:   Weight: 129 kg   Insurance Information HMO:     PPO:      PCP:      IPA:      80/20:      OTHER:  PRIMARY: [employer] [place]  Policy#: [id number]      Subscriber: pt CM Name: Tawanna Cooler      Phone#: [phone]     Fax#: [phone]  Pre-Cert#: [id number]   approved until 03/27/21   Employer:  Benefits:  Phone #: [phone]     Name: 12/29 Eff. Date: 06/17/2020     Deduct: $500      Out of Pocket Max: $1500      Life Max: none CIR: 80%      SNF: 80% 60 days Outpatient: 80%     Co-Pay: 20% 30 visits combined Home Health: 80%      Co-Pay: 20% DME: 80%     Co-Pay: 20% Providers: in network  SECONDARY: none         Financial Counselor:       Phone#:    The [employer] for patients in Inpatient Rehabilitation Facilities with attached Privacy Act [place]  Records was provided and verbally reviewed with: N/A   Emergency Contact Information Contact Information       Name Relation Home Work Mobile    Santmyer,[name] Spouse     [phone]         Current Medical History  Patient Admitting Diagnosis: SAH   History of Present Illness: 65 year old male with history of Dm T2, HTN, thyroid disease and OSA who presented to [facility] on 03/08/21 with sudden onset of left sided weakness and dysarthria. 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 Patient started on Keppra and Cleviprex. Also noted incidentally to have a rash on left side of chest consistent with shingles. Transferred to [facility] ICU.    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 Cleviprex weaned. Concerns for embolic etiology. On Valsartan and metoprolol  with permissive HTN allowance. 30 day event monitor recommended.  Now on ASA. No DAPT given HT and SAH per Neurology. Continue nightly BIPAP for OSA as prior to admit. Elevated TSH but normal T 4. On synthroid and plans To recheck labs as an outpatient.    Uncontrolled type 2 DM with Hgb A1c 11.1. Diabetic coordinator consulted. 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 Lesions scabbed over. On Valtrex. On Lovenox for DVT prophylaxis and SCD.     Complete NIHSS TOTAL: 11   Patient's medical record from [facility] has been reviewed by the rehabilitation admission coordinator and physician.   Past Medical History      Past Medical History:  Diagnosis Date   Diabetes mellitus without complication ([place])     Hypertension     Thyroid disease      Has the patient had major surgery during 100 days prior to admission? No   Family History   family history is not on  file.   Current Medications   Current Facility-Administered Medications:    0.9 %  sodium chloride infusion, , Intravenous, PRN, Greta Doom, MD, Paused at 03/09/21 [phone]   acetaminophen (TYLENOL) tablet 650 mg, 650 mg, Oral, Q6H PRN, Jennelle Human B, NP, 650 mg at 03/16/21 1714   aspirin EC tablet 81 mg, 81 mg, Oral, Daily, de La Torre, Jasper E, NP, 81 mg at 03/16/21 0914   atorvastatin (LIPITOR) tablet 40 mg, 40 mg, Oral, Daily, Rosalin Hawking, MD, 40 mg at 03/16/21 0914   carvedilol (COREG) tablet 6.25 mg, 6.25 mg, Oral, BID WC, Maryjane Hurter, MD, 6.25 mg at 03/16/21 1714   Chlorhexidine Gluconate Cloth 2 %  PADS 6 each, 6 each, Topical, Daily, Greta Doom, MD, 6 each at 03/16/21 0926   docusate sodium (COLACE) capsule 100 mg, 100 mg, Oral, BID, Mick Sell, PA-C, 100 mg at 03/15/21 2956   doxazosin (CARDURA) tablet 8 mg, 8 mg, Oral, Daily, de Yolanda Manges, [place] E, NP, 8 mg at 03/16/21 2130   DULoxetine (CYMBALTA) DR capsule 20 mg, 20 mg, Oral, BID, Gevena Barre K, MD, 20 mg at 03/16/21 2131   enoxaparin (LOVENOX) injection 40 mg, 40 mg, Subcutaneous, Q24H, Palikh, Gaurang M, MD, 40 mg at 03/16/21 0914   insulin aspart (novoLOG) injection 0-15 Units, 0-15 Units, Subcutaneous, TID WC, Payne, John D, PA-C, 5 Units at 03/16/21 1715   insulin aspart (novoLOG) injection 0-5 Units, 0-5 Units, Subcutaneous, QHS, Payne, John D, PA-C, 2 Units at 03/16/21 2258   insulin aspart (novoLOG) injection 10 Units, 10 Units, Subcutaneous, TID WC, Annita Brod, MD, 10 Units at 03/16/21 1716   insulin detemir (LEVEMIR) injection 30 Units, 30 Units, Subcutaneous, BID, Annita Brod, MD, 30 Units at 03/16/21 2132   irbesartan (AVAPRO) tablet 300 mg, 300 mg, Oral, Daily, Maryjane Hurter, MD, 300 mg at 03/16/21 0914   labetalol (NORMODYNE) injection 10 mg, 10 mg, Intravenous, Q2H PRN, Maryjane Hurter, MD, 10 mg at 03/17/21 0341   levothyroxine (SYNTHROID) tablet 125 mcg,  125 mcg, Oral, Q0600, Maryjane Hurter, MD, 125 mcg at 03/17/21 0518   lidocaine (LIDODERM) 5 % 1 patch, 1 patch, Transdermal, Q24H, Greta Doom, MD, 1 patch at 03/15/21 1846   mirtazapine (REMERON) tablet 7.5 mg, 7.5 mg, Oral, QHS, Gevena Barre K, MD, 7.5 mg at 03/16/21 2131   naphazoline-glycerin (CLEAR EYES REDNESS) ophth solution 1-2 drop, 1-2 drop, Both Eyes, QID PRN, Annita Brod, MD   polyethylene glycol (MIRALAX / GLYCOLAX) packet 17 g, 17 g, Oral, Daily PRN, Jennelle Human B, NP, 17 g at 03/12/21 1116   traZODone (DESYREL) tablet 50 mg, 50 mg, Oral, QHS, Annita Brod, MD, 50 mg at 03/16/21 2131   valACYclovir (VALTREX) tablet 1,000 mg, 1,000 mg, Oral, TID, Maryjane Hurter, MD, 1,000 mg at 03/16/21 2132   Patients Current Diet:  Diet Order                  Diet Carb Modified Fluid consistency: Thin; Room service appropriate? Yes  Diet effective now                       Precautions / Restrictions Precautions Precautions: Fall Precaution Comments: L weakness Restrictions Weight Bearing Restrictions: No Other Position/Activity Restrictions: off shingles precautions    Has the patient had 2 or more falls or a fall with injury in the past year? No   Prior Activity Level Community (5-7x/wk): independent, working and driving   Prior Functional Level Self Care: Did the patient need help bathing, dressing, using the toilet or eating? Independent   Indoor Mobility: Did the patient need assistance with walking from room to room (with or without device)? Independent   Stairs: Did the patient need assistance with internal or external stairs (with or without device)? Independent   Functional Cognition: Did the patient need help planning regular tasks such as shopping or remembering to take medications? Independent   Patient Information Are you of Hispanic, Latino/a,or Spanish origin?: A. No, not of Hispanic, Latino/a, or Spanish origin What is  your race?: A. White Do you need or want an interpreter  to communicate with a doctor or health care staff?: 0. No   Patient's Response To:  Health Literacy and Transportation Is the patient able to respond to health literacy and transportation needs?: Yes Health Literacy - How often do you need to have someone help you when you read instructions, pamphlets, or other written material from your doctor or pharmacy?: Never In the past 12 months, has lack of transportation kept you from medical appointments or from getting medications?: No In the past 12 months, has lack of transportation kept you from meetings, work, or from getting things needed for daily living?: No   Development worker, international aid / [place] Devices/Equipment:  (reading glasses and hearing aids (not at bedside)) Home Equipment: Shower seat - built in, [employer] - tub/shower   Prior Device Use: Indicate devices/aids used by the patient prior to current illness, exacerbation or injury? None of the above   Current Functional Level Cognition   Arousal/Alertness: Awake/alert Overall Cognitive Status: History of cognitive impairments - at baseline Orientation Level: Oriented X4 General Comments: followed commands, aware of deficits Attention: Divided Divided Attention: Appears intact Memory: Impaired Memory Impairment: Retrieval deficit Awareness: Appears intact Problem Solving: Appears intact    Extremity Assessment (includes Sensation/Coordination)   Upper Extremity Assessment: LUE deficits/detail LUE Deficits / Details: 0/5 hand/elbow, 1/5 shoulder, edema noted at hand LUE Sensation: decreased light touch LUE Coordination: decreased fine motor, decreased gross motor  Lower Extremity Assessment: Defer to PT evaluation LLE Deficits / Details: 0/5 DF, 2/5 knee and hip LLE Sensation: decreased light touch, decreased proprioception     ADLs   Overall ADL's : Needs assistance/impaired Eating/Feeding: Set up,  Bed level Grooming: Wash/dry hands, Wash/dry face, Set up, Bed level Upper Body Bathing: Moderate assistance, Bed level Lower Body Bathing: Maximal assistance, Bed level Upper Body Dressing : Maximal assistance, Bed level Lower Body Dressing: Total assistance, Bed level Toilet Transfer: Moderate assistance, +2 for physical assistance, +2 for safety/equipment (used stedy) Toilet Transfer Details (indicate cue type and reason): mod assist +2 to use stedy for transfer to [place] and Hygiene: Total assistance, Bed level Toileting - Clothing Manipulation Details (indicate cue type and reason): patient rolled to left to assist with toilet hygiene General ADL Comments: Patient on bed pan when therapist arrived and assisted with cleaning by rolling to left using rail     Mobility   Overal bed mobility: Needs Assistance Bed Mobility: Rolling, Sit to Sidelying Rolling: Mod assist Sidelying to sit: Mod assist, HOB elevated Supine to sit: Mod assist, HOB elevated Sit to supine: Max assist, HOB elevated Sit to sidelying: Mod assist General bed mobility comments: mod assist for trunk lowering, LE lifting into bed, and roll onto back. Pt able to boost self up with use of RUE and knees bent     Transfers   Overall transfer level: Needs assistance Equipment used: Ambulation equipment used, None Transfers: Bed to chair/wheelchair/BSC, Sit to/from Stand Sit to Stand: Max assist, +2 physical assistance Bed to/from chair/wheelchair/BSC transfer type:: Squat pivot Squat pivot transfers: Max assist  Lateral/Scoot Transfers: Mod assist, +2 physical assistance Transfer via Lift Equipment: Stedy General transfer comment: max +2 to stand from low recliner for power up, hip rise with bed pad, and steadying upon standing. Mod assist +1 to stand from elevated height of stedy. STS from stedy x4.     Ambulation / Gait / Stairs / Wheelchair Mobility   Ambulation/Gait General Gait  Details: unable Pre-gait activities: wt shifting  in standing in stedy with mod A +2     Posture / Balance Dynamic Sitting Balance Sitting balance - Comments: reliant on RUE to assist with balance Balance Overall balance assessment: Needs assistance Sitting-balance support: Feet supported, Single extremity supported Sitting balance-Leahy Scale: Fair Sitting balance - Comments: reliant on RUE to assist with balance Postural control: Left lateral lean Standing balance support: Bilateral upper extremity supported, During functional activity, Reliant on assistive device for balance Standing balance-Leahy Scale: Poor Standing balance comment: stood 2 mins in stedy for wt shifting, 2x     Special needs/care consideration  Recent shingles off airborne precautions 12/29 Hgb A1c 11.1 BiPAP Dream station prior to admit Wife to hire caregiver supports at home as needed    Previous Home Environment  Living Arrangements: Spouse/significant other  Lives With: Spouse Available Help at Discharge: Family, Available 24 hours/day (wife will hire caregivers) Type of Home: [place]: One level Home Access: Ramped entrance Bathroom Shower/Tub: Multimedia programmer: Handicapped height [place] Accessibility: Yes How Accessible: Accessible via [place]: No Additional Comments: Pt's wife uses RW adn wheelchair for mobility   Discharge Living Setting Plans for Discharge Living Setting: Patient's home, Lives with (comment) (spouse) Type of Home at Discharge: House Discharge Home Layout: One level Discharge Home Access: [place] entrance Discharge Bathroom Shower/Tub: Walk-in shower Discharge Bathroom Toilet: Handicapped height Discharge Bathroom Accessibility: Yes How Accessible: Accessible via walker Does the patient have any problems obtaining your medications?: No   Social/Family/Support Systems Patient Roles: Spouse Contact Information: wife,  [name] Anticipated Caregiver: wife and hired caregivers Anticipated Ambulance person Information: see contacts Ability/Limitations of Caregiver: wife uses RW and wheelchair for mobility; she is a retired Pension scheme manager: 24/7 Discharge Plan Discussed with Primary Caregiver: Yes Is Caregiver In Agreement with Plan?: Yes Does Caregiver/Family have Issues with Lodging/Transportation while Pt is in Rehab?: No   Goals Patient/Family Goal for Rehab: supervision to min asisst with PT and OT Expected length of stay: ELOS 10 to 14 days Pt/Family Agrees to Admission and willing to participate: Yes Program Orientation Provided & Reviewed with Pt/Caregiver Including Roles  & Responsibilities: Yes Additional Information Needs: I discussed that BCBS will not approve both CIR and SNF after CIR discharge   Decrease burden of Care through IP rehab admission: n/a   Possible need for SNF placement upon discharge: I discussed with wife and patient that [employer] will not approve both CIR admit as well as SNF after CIR Patient Condition: I have reviewed medical records from [facility] , spoken with CM, and patient and spouse. I met with patient at the bedside for inpatient rehabilitation assessment.  Patient will benefit from ongoing PT and OT, can actively participate in 3 hours of therapy a day 5 days of the week, and can make measurable gains during the admission.  Patient will also benefit from the coordinated team approach during an Inpatient Acute Rehabilitation admission.  The patient will receive intensive therapy as well as Rehabilitation physician, nursing, social worker, and care management interventions.  Due to bladder management, bowel management, safety, skin/wound care, disease management, medication administration, pain management, and patient education the patient requires 24 hour a day rehabilitation nursing.  The patient is currently mod to max assist overall with  mobility and basic ADLs.  Discharge setting and therapy post discharge at home with home health is anticipated.  Patient has agreed to participate in the Acute Inpatient Rehabilitation Program and will admit today.  Preadmission Screen Completed By:  Cleatrice Burke, 03/17/2021 8:56 AM ______________________________________________________________________   Discussed status with Dr. Naaman Plummer on 03/17/2021 at 1042 and received approval for admission today.   Admission Coordinator:  Cleatrice Burke, RN, time  8032 Date 03/17/2021    Assessment/Plan: Diagnosis: SAH Does the need for close, 24 hr/day Medical supervision in concert with the patient's rehab needs make it unreasonable for this patient to be served in a less intensive setting? Yes Co-Morbidities requiring supervision/potential complications: DM, HTN, OSA, obesity Due to bladder management, bowel management, safety, skin/wound care, disease management, medication administration, pain management, and patient education, does the patient require 24 hr/day rehab nursing? Yes Does the patient require coordinated care of a physician, rehab nurse, PT, OT, and SLP to address physical and functional deficits in the context of the above medical diagnosis(es)? Yes Addressing deficits in the following areas: balance, endurance, locomotion, strength, transferring, bowel/bladder control, bathing, dressing, feeding, grooming, toileting, cognition, and psychosocial support Can the patient actively participate in an intensive therapy program of at least 3 hrs of therapy 5 days a week? Yes The potential for patient to make measurable gains while on inpatient rehab is excellent Anticipated functional outcomes upon discharge from inpatient rehab: supervision and min assist PT, supervision and min assist OT, n/a SLP Estimated rehab length of stay to reach the above functional goals is: 10-14 days Anticipated discharge destination: Home 10.  Overall Rehab/Functional Prognosis: excellent     MD Signature: Meredith Staggers, MD, [place] Director Rehabilitation Services 03/17/2021          Revision History                                    Note Details  Author Meredith Staggers, MD File Time 03/17/2021 11:06 AM  Author Type Physician Status Signed  Last Editor Meredith Staggers, MD Service Physical Medicine and Rehabilitation

## 2021-03-17 NOTE — Evaluation (Signed)
Occupational Therapy Assessment and Plan  Patient Details  Name: Joseph Ortega MRN: [id number] Date of Birth: 19-Mar-1956  OT Diagnosis: abnormal posture, acute pain, hemiplegia affecting non-dominant side, muscle weakness (generalized), and swelling of limb Rehab Potential: Rehab Potential (ACUTE ONLY): Excellent ELOS: 3-4 weeks   Today's Date: 03/18/2021 OT Individual Time: 1829-9371 OT Individual Time Calculation (min): 75 min     Hospital Problem: Principal Problem:   ICH (intracerebral hemorrhage) ([place])   Past Medical History:  Past Medical History:  Diagnosis Date   Diabetes mellitus without complication ([place])    Hypertension    Thyroid disease    Past Surgical History:  Past Surgical History:  Procedure Laterality Date   HERNIA REPAIR     IR ANGIO INTRA EXTRACRAN SEL COM CAROTID INNOMINATE BILAT MOD SED  03/09/2021   IR ANGIO VERTEBRAL SEL VERTEBRAL BILAT MOD SED  03/09/2021   IR US GUIDE VASC ACCESS RIGHT  03/09/2021    Assessment & Plan Clinical Impression: Joseph Ortega is a 65 year old right-handed male who was in his usual state of health until March 08, 2021 when he presented to [facility] emergency department complaining of left arm and leg weakness.  Code stroke was initiated.  Neurology was consulted.  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  He was transferred to [facility].  He was noted to have a left anterior chest rash consistent with herpes zoster.  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  He was admitted to the neuro ICU and placed on Cleviprex and Keppra.  Neurosurgery was consulted.  On 12/22 he underwent four-vessel cerebral arteriogram by Dr. Estanislado Pandy.  There was no evidence of DAVF, AV shunting, AVM, aneurysms or of dissection.  Follow-up MRI on 12/23 revealed new right occipital lobe infarction and enlargement of posterior frontal  infarction.  He was hemodynamically stable. He underwent speech language pathology evaluation and was started on regular diet.  A repeat CT of the head on 12/25 was stable and aspirin therapy was initiated.  He underwent bilateral lower extremity venous Doppler study on 12/25 negative for DVT.  He underwent CTA of the neck the following day and was unchanged from prior.  Left upper quadrantanopia improved; lower quadrantanopia persisted.  Blood pressure remained controlled on valsartan and metoprolol.  He was continued on his home BiPAP with history of obstructive sleep apnea.  Keppra continues.  Patient will need 30-day event monitor.  Diabetes coordinator was consulted for assistance with blood glucoses and insulin dosing. Patient was overall requiring mod physical assist for bed mobility and +2 for transfer. The patient requires inpatient physical medicine and rehabilitation evaluations and treatment secondary to dysfunction due to hemispheric subarachnoid hemorrhage, right occipital lobe infarction and enlargement of posterior frontal infarction.  Patient currently requires max with basic self-care skills secondary to muscle weakness and muscle paralysis, decreased cardiorespiratoy endurance, abnormal tone, unbalanced muscle activation, and decreased coordination, and decreased sitting balance, decreased standing balance, decreased postural control, hemiplegia, and decreased balance strategies.  Prior to hospitalization, patient could complete BADLs with independent .  Patient will benefit from skilled intervention to increase independence with basic self-care skills prior to discharge home with care partner.  Anticipate patient will require 24 hour supervision and minimal physical assistance and follow up home health.  OT - End of Session Endurance Deficit: Yes Endurance Deficit Description: requires rest breaks during mobility tasks OT Assessment Rehab Potential (ACUTE ONLY): Excellent  OT Barriers  to Discharge: Decreased caregiver support;Lack of/limited family support OT Patient demonstrates impairments in the following area(s): Balance;Edema;Endurance;Motor;Pain;Safety OT Basic ADL's Functional Problem(s): Grooming;Bathing;Dressing;Toileting OT Advanced ADL's Functional Problem(s): Simple Meal Preparation OT Transfers Functional Problem(s): Toilet;Tub/Shower OT Additional Impairment(s): Fuctional Use of Upper Extremity OT Plan OT Intensity: Minimum of 1-2 x/day, 45 to 90 minutes OT Frequency: 5 out of 7 days OT Duration/Estimated Length of Stay: 3-4 weeks OT Treatment/Interventions: Disease mangement/prevention;Balance/vestibular training;Neuromuscular re-education;Self Care/advanced ADL retraining;Therapeutic Exercise;Wheelchair propulsion/positioning;UE/LE Strength taining/ROM;Pain management;DME/adaptive equipment instruction;Community reintegration;Functional electrical stimulation;Patient/family education;Splinting/orthotics;UE/LE Coordination activities;Therapeutic Activities;Psychosocial support;Functional mobility training;Discharge planning OT Self Feeding Anticipated Outcome(s): No goal OT Basic Self-Care Anticipated Outcome(s): Supervision-Min A OT Toileting Anticipated Outcome(s): Min A OT Bathroom Transfers Anticipated Outcome(s): Supervision OT Recommendation Recommendations for Other Services: Therapeutic Recreation consult Therapeutic Recreation Interventions: Kitchen group;Stress management Patient destination: Home Follow Up Recommendations: Home health OT Equipment Recommended: To be determined   OT Evaluation Precautions/Restrictions  Precautions Precautions: Fall Precaution Comments: Lt hemi with Lt sided edema Restrictions Weight Bearing Restrictions: No General   Vital Signs  Pain Pain Assessment Pain Scale: 0-10 Pain Score: 0-No pain Home Living/Prior Functioning Home Living Family/patient expects to be discharged to:: Private residence Living  Arrangements: Spouse/significant other Available Help at Discharge: Family, Available 24 hours/day Type of Home: House Home Access: Ramped entrance Home Layout: One level Bathroom Shower/Tub: Multimedia programmer: Handicapped height Bathroom Accessibility: Yes Additional Comments: Pt reports being a caregiver for his wife who has cardiac issues/a-fib, limited mostly by endurance deficits. 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 Leisure and Hobbies: taking care of his dogs Prior Function Level of Independence: Independent with basic ADLs, Independent with homemaking with ambulation  Able to Take Stairs?: Yes Driving: Yes Vocation: Full time employment Vision Baseline Vision/History: 1 Wears glasses Ability to See in Adequate Light: 0 Adequate Patient Visual Report: Peripheral vision impairment Vision Assessment?: Yes Eye Alignment: Impaired (comment) (inward posturing) Tracking/Visual Pursuits: Decreased smoothness of vertical tracking;Decreased smoothness of horizontal tracking;Requires cues, head turns, or add eye shifts to track Convergence: Impaired (comment) (unable) Perception  Perception: Impaired Inattention/Neglect: Does not attend to left visual field;Other (comment) (Impaired relative to R visual field, but pt reports improving) Praxis Praxis: Intact Cognition Overall Cognitive Status: Within Functional Limits for tasks assessed Arousal/Alertness: Awake/alert Orientation Level: Person;Place;Situation Person: Oriented Place: Oriented Situation: Oriented Year: 2022 Month: December Day of Week: Correct Immediate Memory Recall: Blue;Sock;Bed Memory Recall Sock:  Without Cue Memory Recall Blue: Without Cue Memory Recall Bed: Without Cue Awareness: Appears intact Problem Solving: Appears intact Safety/Judgment: Appears intact Sensation Sensation Light Touch: Appears Intact Coordination Gross Motor Movements are Fluid and Coordinated: No Fine Motor Movements are Fluid and Coordinated: No Coordination and Movement Description: Affected by Lt hemiplegia and Lt sided edema Finger Nose Finger Test: WNL Rt, unable to complete on the Lt Motor  Motor Motor: Hemiplegia  Trunk/Postural Assessment  Cervical Assessment Cervical Assessment: Exceptions to [facility] (forward head) Thoracic Assessment Thoracic Assessment: Exceptions to [facility] (rounded shoulders, Lt scapular depression and abduction) Lumbar Assessment Lumbar Assessment: Exceptions to [facility] (posterior pelvic tilt) Postural Control Postural Control: Deficits on evaluation (limited during dynamic sitting + dynamic standing tasks)  Balance Balance Balance Assessed: Yes Static Sitting Balance Static Sitting - Balance Support:  Feet supported;Bilateral upper extremity supported Static Sitting - Level of Assistance: 5: Stand by assistance Dynamic Sitting Balance Dynamic Sitting - Balance Support: During functional activity;No upper extremity supported;Feet supported Dynamic Sitting - Level of Assistance: 4: Min assist (donning overhead shirt) Sitting balance - Comments: reliant on RUE to assist with balance Static Standing Balance Static Standing - Balance Support: Left upper extremity supported Static Standing - Level of Assistance: 1: +1 Total assist Dynamic Standing Balance Dynamic Standing - Balance Support: During functional activity Dynamic Standing - Level of Assistance: 2: Max assist Dynamic Standing - Balance Activities: Forward lean/weight shifting;Lateral lean/weight shifting Extremity/Trunk Assessment RUE Assessment RUE Assessment: Within Functional Limits Active Range of Motion (AROM)  Comments: WNL LUE Assessment LUE Assessment: Exceptions to [facility] LUE Body System: Neuro Brunstrum levels for arm and hand: Arm;Hand Brunstrum level for arm: Stage II Synergy is developing Brunstrum level for hand: Stage I Flaccidity LUE Tone LUE Tone: Hypotonic  Care Tool Care Tool Self Care Eating    Setup assist    Oral Care    Oral Care Assist Level: Moderate Assistance - Patient 50 - 74%    Bathing   Body parts bathed by patient: Chest;Abdomen;Front perineal area;Right upper leg;Left upper leg;Face Body parts bathed by helper: Right arm;Left arm;Buttocks;Right lower leg;Left lower leg   Assist Level: Maximal Assistance - Patient 24 - 49%    Upper Body Dressing(including orthotics)   What is the patient wearing?: Pull over shirt   Assist Level: Moderate Assistance - Patient 50 - 74%    Lower Body Dressing (excluding footwear)   What is the patient wearing?: Pants;Underwear/pull up Assist for lower body dressing: Total Assistance - Patient < 25%    Putting on/Taking off footwear   What is the patient wearing?: Non-skid slipper socks Assist for footwear: Total Assistance - Patient < 25%       Care Tool Toileting Toileting activity Toileting Activity did not occur (Clothing management and hygiene only): N/A (no void or bm)       Care Tool Bed Mobility Roll left and right activity   Roll left and right assist level: Maximal Assistance - Patient 25 - 49%    Sit to lying activity   Sit to lying assist level: Maximal Assistance - Patient 25 - 49%    Lying to sitting on side of bed activity   Lying to sitting on side of bed assist level: the ability to move from lying on the back to sitting on the side of the bed with no back support.: Maximal Assistance - Patient 25 - 49%     Care Tool Transfers Sit to stand transfer   Sit to stand assist level: Total Assistance - Patient < 25%    Chair/bed transfer   Chair/bed transfer assist level: Total Assistance - Patient <  25%     Toilet transfer Toilet transfer activity did not occur: N/A (not assessed due to time constraints) Assist Level: Total Assistance - Patient < 25%     Care Tool Cognition  Expression of Ideas and Wants Expression of Ideas and Wants: 4. Without difficulty (complex and basic) - expresses complex messages without difficulty and with speech that is clear and easy to understand  Understanding Verbal and Non-Verbal Content Understanding Verbal and Non-Verbal Content: 4. Understands (complex and basic) - clear comprehension without cues or repetitions   Memory/Recall Ability     Refer to Care Plan for Long Term Goals  SHORT TERM GOAL WEEK 1 OT Short Term  Goal 1 (Week 1): Pt will complete BSC transfer with +2 assist to progress with OOB toileting OT Short Term Goal 2 (Week 1): Pt will complete sit<stand in [place] with no more than Mod A during 2 consecutive ADL sessions OT Short Term Goal 3 (Week 1): Pt will assist with 1/3 components of toileting with no more than Mod balance assist  Recommendations for other services: Therapeutic Recreation  Kitchen group and Stress management   Skilled Therapeutic Intervention Skilled OT session completed with focus on initial evaluation, education on OT role/POC, and establishment of patient-centered goals.   Pt greeted in bed with some upper trap pain however premedicated with topical analgesic patch. He was motivated to engage in BADLs. Supine<sit completed with Mod A. Pt with good sitting balance during self care EOB overall though did exhibit some Rt lateral LOBs during dynamic activity, able to self correct on his own. His Lt UE is nonfunctional though does exhibit some external rotation/internal rotation with gravity minimized. Discussed self ROM of hand for edema mgt and also joint protection when moving affected limb. Max A overall for self care with hand over hand for using his Lt UE and for carryover of hemi dressing strategies. Max A of 1 for  sit<stand in [place] for LB self care. Pt did well with mindfully activating his Lt knee so as to not rely completely on the knee block of [place], though did use the knee block with onset of standing fatigue. Pt returned to bed at close of session, left him with all needs within reach, wearing PRAFO, and Lt UE elevated on pillow. Bed alarm set.   ADL ADL Eating: Set up (per most recent staff documentation) Grooming: Moderate assistance Where Assessed-Grooming: Edge of bed Upper Body Bathing: Moderate assistance Where Assessed-Upper Body Bathing: Edge of bed Lower Body Bathing: Maximal assistance Where Assessed-Lower Body Bathing: Edge of bed Upper Body Dressing: Moderate assistance Where Assessed-Upper Body Dressing: Edge of bed Lower Body Dressing: Dependent Where Assessed-Lower Body Dressing: Edge of bed Toileting: Not assessed Toilet Transfer: Not assessed Tub/Shower Transfer: Not assessed Mobility  Bed Mobility Bed Mobility: Supine to Sit;Sit to Supine Supine to Sit: Maximal Assistance - Patient - Patient 25-49% Sit to Supine: Maximal Assistance - Patient 25-49% Transfers Sit to Stand: Total Assistance - Patient < 25% Stand to Sit: Total Assistance - Patient < 25%   Discharge Criteria: Patient will be discharged from OT if patient refuses treatment 3 consecutive times without medical reason, if treatment goals not met, if there is a change in medical status, if patient makes no progress towards goals or if patient is discharged from hospital.  The above assessment, treatment plan, treatment alternatives and goals were discussed and mutually agreed upon: by patient  Skeet Simmer 03/18/2021, 12:45 PM

## 2021-03-17 NOTE — Progress Notes (Signed)
Inpatient Rehabilitation Admission Medication Review by a Pharmacist  A complete drug regimen review was completed for this patient to identify any potential clinically significant medication issues.  High Risk Drug Classes Is patient taking? Indication by Medication  Antipsychotic Yes Compazine- N/V  Anticoagulant Yes Lovenox- VTE prophylaxis  Antibiotic No   Opioid No   Antiplatelet Yes Aspirin- CVA prophylaxis  Hypoglycemics/insulin Yes iSS, levemir- T2DM  Vasoactive Medication Yes Cardura, Avapro, coreg- BPH, hypertension  Chemotherapy No   Other Yes Lipitor- HLD Remeron- MDD Trazodone- sleep Cymbalta- MDD Synthroid- hypothyroidism Valacyclovir- HZV     Type of Medication Issue Identified Description of Issue Recommendation(s)  Drug Interaction(s) (clinically significant)     Duplicate Therapy     Allergy     No Medication Administration End Date     Incorrect Dose     Additional Drug Therapy Needed     Significant med changes from prior encounter (inform family/care partners about these prior to discharge).    Other  PTA meds: Farxiga Flonase Gabapentin Trulicity Ergocalciferol Restart PTA meds if and when clinically necessary during CIR admission or at discharge    Clinically significant medication issues were identified that warrant physician communication and completion of prescribed/recommended actions by midnight of the next day:  No   Time spent performing this drug regimen review (minutes):  30    Gage Weant BS, PharmD, BCPS Clinical Pharmacist 03/17/2021 2:30 PM

## 2021-03-17 NOTE — Discharge Summary (Signed)
Physician Discharge Summary   Patient: Joseph Ortega MRN: [id number] DOB: @DOB   Admit date:     03/08/2021  Discharge date: 03/17/21  Discharge Physician: Annita Brod   PCP: Kristopher Glee., MD   Recommendations at discharge: 1.  Patient being discharged to inpatient rehab         Hospital Course   65 year old male with past medical history of hypertension & diabetes presented to the emergency room at [facility] on 12/21 with left-sided weakness and CT scan of head noted 4 mm intraparenchymal hematoma in the right posterior frontal lobe with subarachnoid penetration.  Patient started on Keppra and Cleviprex.  Also incidentally noted to have rash on left side of chest consistent with shingles.  Transferred to [facility] for neuro ICU.  Follow-up MRI reveals new right occipital lobe infarction and enlargement of posterior frontal infarction.  Cleviprex stopped.  Concerns for embolic etiology.  Work-up complete with plans for 30-day event recorder.  Seen by PT and OT who recommended inpatient rehab and patient was accepted and transferred on 12/30.  Discharge Diagnoses Cardiovascular and Mediastinum Essential hypertension Assessment & Plan On valsartan and metoprolol.  Allowing for some permissive hypertension although at times, blood pressure in normal range.  Respiratory OSA (obstructive sleep apnea) Assessment & Plan Continue nightly BiPAP  Endocrine Hypothyroidism Assessment & Plan Elevated TSH, but normal T4 and likely in the context of other acute medical problems.  Would not make any adjustments in Synthroid at this time and recheck labs 6 weeks after discharge.  Uncontrolled type 2 diabetes mellitus with hyperglycemia (HCC) Assessment & Plan CBGs still somewhat elevated.  Appreciate diabetic coordinator's assistance and have increased Levemir to 35 units units twice daily on day of discharge along with 3 times daily NovoLog insulin at 14 units plus  sliding scale.  Nervous and Auditory * SAH (subarachnoid hemorrhage) [facility]) Assessment & Plan Appreciate neurology help.  Patient with right frontal localized intraparenchymal hemorrhage and underlying hemorrhagic infarct with etiology indeterminant possibly secondary to venous angioma.  Repeat MRI on 12/23 notes new acute infarction of right occipital lobe and enlargement of posterior frontal infarction.  On Keppra 500 twice a day.  Looking at skilled nursing.  LDL at 106.  A1c 11.1.  Not on any antithrombotics currently.  Seen by neurology and follow-up with signed off.  Repeat CVA felt to be possibly atherosclerosis versus embolic.  Patient will need 30-day event monitor.  Other Redness of both eyes Assessment & Plan No pain or loss of vision.  Started eyedrops on 12/25.  Both eyes have since improved  Tobacco abuse Assessment & Plan Counseled.  Nicotine patch.  Depression Assessment & Plan Home medicines restarted  Herpes zoster with complication Assessment & Plan Left anterior chest around T10 dermatome.  On Valtrex 3 times daily for 2 to 3 weeks.  Will discontinue airborne precautions.  Lesions scabbed over.  Morbid obesity ([place]) Assessment & Plan Meets criteria BMI greater than 35+ comorbidities of diabetes and hypertension and CVA    Consultants:  Neurology Critical care Neurosurgery Interventional radiology  Procedures performed: None    Disposition:  Inpatient rehab Diet recommendation: Cardiac diet  DISCHARGE MEDICATION: Allergies as of 03/17/2021   No Known Allergies      Medication List     ASK your doctor about these medications    doxazosin 8 MG tablet Commonly known as: CARDURA Take 1 tablet by mouth daily.   DULoxetine 20 MG capsule Commonly known as: CYMBALTA Take  20 mg by mouth 2 (two) times daily.   Farxiga 10 MG Tabs tablet Generic drug: dapagliflozin propanediol Take 10 mg by mouth daily.   fluticasone 50 MCG/ACT nasal  spray Commonly known as: FLONASE Place 1 spray into both nostrils daily.   gabapentin 300 MG capsule Commonly known as: NEURONTIN Take 300 mg by mouth 2 (two) times daily.   insulin detemir 100 UNIT/ML injection Commonly known as: LEVEMIR Inject 50 Units into the skin daily.   levothyroxine 125 MCG tablet Commonly known as: SYNTHROID Take 125 mcg by mouth daily before breakfast.   metoprolol tartrate 50 MG tablet Commonly known as: LOPRESSOR Take 50 mg by mouth daily.   mirtazapine 15 MG tablet Commonly known as: REMERON Take 7.5 mg by mouth at bedtime.   NovoLOG FlexPen 100 UNIT/ML FlexPen Generic drug: insulin aspart Inject 10 Units into the skin in the morning, at noon, and at bedtime. Sliding scale   traZODone 50 MG tablet Commonly known as: DESYREL Take 50 mg by mouth at bedtime.   Trulicity 3 MG/0.5ML Sopn Generic drug: Dulaglutide Inject 3 mg into the skin every Thursday.   valsartan 320 MG tablet Commonly known as: DIOVAN Take 1 tablet by mouth daily.   Vitamin D (Ergocalciferol) 1.25 MG (50000 UNIT) Caps capsule Commonly known as: DRISDOL Take 50,000 Units by mouth every Sunday. sunday        Follow-up Information     [facility]. Schedule an appointment as soon as possible for a visit in 1 month(s).   Specialty: Neurology Why: stroke clinic Contact information: [address] [place] [address] [phone]                Discharge Exam: Ceasar Mons Weights   03/15/21 0423 03/16/21 0347 03/17/21 0500  Weight: 128.9 kg 129.5 kg 129 kg   General: Alert and oriented x3, no acute distress Cardiovascular: Regular rate and rhythm, S1-S2 Lungs: Clear to auscultation bilaterally  Condition at discharge: improving  The results of significant diagnostics from this hospitalization (including imaging, microbiology, ancillary and laboratory) are listed below for reference.   Imaging Studies: CT ANGIO  HEAD W OR WO CONTRAST  Result Date: 03/11/2021 CLINICAL DATA:  Stroke follow-up EXAM: CT ANGIOGRAPHY HEAD TECHNIQUE: Multidetector CT imaging of the head was performed using the standard protocol during bolus administration of intravenous contrast. Multiplanar CT image reconstructions and MIPs were obtained to evaluate the vascular anatomy. CONTRAST:  4mL OMNIPAQUE IOHEXOL 350 MG/ML SOLN COMPARISON:  Head CT 03/10/2021 FINDINGS: CT HEAD Brain: Expected evolution of early subacute infarct of the right PCA territory. Redistribution of subarachnoid blood within the right central sulcus and posterior right hemisphere. No new site of hemorrhage. Edema in the posterior right frontal lobe has increased. No midline shift or other mass effect. No hydrocephalus. Vascular: No abnormal hyperdensity of the major intracranial arteries or dural venous sinuses. No intracranial atherosclerosis. Skull: The visualized skull base, calvarium and extracranial soft tissues are normal. Sinuses/Orbits: No fluid levels or advanced mucosal thickening of the visualized paranasal sinuses. No mastoid or middle ear effusion. The orbits are normal. CTA HEAD POSTERIOR CIRCULATION: --Vertebral arteries: Normal --Inferior cerebellar arteries: Normal. --Basilar artery: Normal. --Superior cerebellar arteries: Normal. --Posterior cerebral arteries: Focal calcification within the right PCA distal P3 segment, likely calcified thromboembolism. The right PCA is patent distal to this area and otherwise normal. Left PCA normal. ANTERIOR CIRCULATION: --Intracranial internal carotid arteries: Normal. --Anterior cerebral arteries (ACA): Normal. --Middle cerebral arteries (MCA): Normal. ANATOMIC  VARIANTS: None Review of the MIP images confirms the above findings. IMPRESSION: 1. No aneurysm. 2. Expected evolution of early subacute infarct of the right PCA territory with slightly increased edema in the posterior right frontal lobe. 3. Redistribution of  subarachnoid blood within the right central sulcus and posterior right hemisphere. No new site of hemorrhage. 4. No intracranial arterial occlusion or high-grade stenosis. 5. Focal calcification within the right PCA distal P3 segment, likely calcified thromboembolism. Electronically Signed   By: Ulyses Jarred M.D.   On: 03/11/2021 20:17   CT HEAD WO CONTRAST (5MM)  Addendum Date: 03/10/2021   ADDENDUM REPORT: 03/10/2021 06:00 ADDENDUM: Study discussed by telephone with Neurology Dr. Lorrin Goodell at 0550 hours. Electronically Signed   By: Genevie Ann M.D.   On: 03/10/2021 06:00   Result Date: 03/10/2021 CLINICAL DATA:  65 year old male code stroke presentation with right hemisphere subarachnoid hemorrhage, unrevealing CTA. MRI revealing small right frontal lobe infarct. Conventional cerebral angiogram negative for aneurysm or vascular lesion. EXAM: CT HEAD WITHOUT CONTRAST TECHNIQUE: Contiguous axial images were obtained from the base of the skull through the vertex without intravenous contrast. COMPARISON:  None. FINDINGS: Brain: Small volume right hemisphere subarachnoid hemorrhage, primarily within the central sulcus and right sylvian fissure has regressed since 03/08/2021 with small volume residual. Patchy roughly 2.5 cm hypodense area of cerebral edema in the right middle frontal gyrus corresponding to restricted diffusion on MRI (series 2, image 23) with adjacent conspicuous 5 mm rounded area of hemorrhage (series 2, image 24). Furthermore, increasing hypodensity in the superior right occipital lobe at the right MCA/PCA watershed area (series 5, image 26 and series 2, image 17. But elsewhere gray-white matter differentiation is stable and within normal limits. No intracranial mass effect. No intraventricular hemorrhage or ventriculomegaly. Normal basilar cisterns. Vascular: Mild Calcified atherosclerosis at the skull base. Skull: No acute osseous abnormality identified. Sinuses/Orbits: Visualized paranasal  sinuses and mastoids are stable and well aerated. Other: No acute orbit or scalp soft tissue finding. IMPRESSION: 1. New cerebral edema in the [place], rior Right Occipital Lobe at the right MCA/PCA watershed. This is superimposed on the small posterior right frontal lobe infarct. 2. Small volume acute right hemisphere hemorrhage has partially regressed since 03/08/2021. No new or increased blood. No intracranial mass effect. Electronically Signed: By: Genevie Ann M.D. On: 03/10/2021 05:52   CT ANGIO NECK W OR WO CONTRAST  Result Date: 03/14/2021 CLINICAL DATA:  Acute neurologic deficit EXAM: CT ANGIOGRAPHY NECK TECHNIQUE: Multidetector CT imaging of the neck was performed using the standard protocol during bolus administration of intravenous contrast. Multiplanar CT image reconstructions and MIPs were obtained to evaluate the vascular anatomy. Carotid stenosis measurements (when applicable) are obtained utilizing NASCET criteria, using the distal internal carotid diameter as the denominator. CONTRAST:  128mL OMNIPAQUE IOHEXOL 350 MG/ML SOLN COMPARISON:  03/08/2021 FINDINGS: Aortic arch: Standard branching. Imaged portion shows no evidence of aneurysm or dissection. No significant stenosis of the major arch vessel origins. Right carotid system: No evidence of dissection, stenosis (50% or greater) or occlusion. Left carotid system: No evidence of dissection, stenosis (50% or greater) or occlusion. Vertebral arteries: Codominant. No evidence of dissection, stenosis (50% or greater) or occlusion. Skeleton: Multifocal sclerotic change within the vertebral column is unchanged since 03/08/2021. 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 Upper chest: Negative IMPRESSION: 1. No dissection, occlusion or hemodynamically significant stenosis of the carotid or vertebral arteries. 2. Multifocal sclerotic change within the vertebral column, unchanged  since 03/08/2021, consistent with treated metastatic  disease. Electronically Signed   By: Ulyses Jarred M.D.   On: 03/14/2021 02:29   MR BRAIN W WO CONTRAST  Result Date: 03/10/2021 CLINICAL DATA:  Transient ischemic attack.  Stroke suspected. EXAM: MRI HEAD WITHOUT AND WITH CONTRAST TECHNIQUE: Multiplanar, multiecho pulse sequences of the brain and surrounding structures were obtained without and with intravenous contrast. CONTRAST:  95mL GADAVIST GADOBUTROL 1 MMOL/ML IV SOLN COMPARISON:  Head CT same day. Angiography 03/09/2021. CT and MRI studies 03/08/2021. FINDINGS: Brain: Diffusion imaging confirms new acute infarction in the right occipital lobe. Acute infarction in the subcortical white matter at the right posterior frontal region is larger than was seen 2 days ago, approximately doubled in volume. No other new insult. Chronic small-vessel ischemic changes affect pons, cerebellum and cerebral hemispheric white matter. Subarachnoid blood in the sulci on the right remains visible. No intraventricular layering. Numerous foci of old hemosiderin deposition as seen previously. After contrast administration, no abnormal enhancement occurs. Vascular: Major vessels at the base of the brain show flow. Skull and upper cervical spine: Negative Sinuses/Orbits: Clear/normal Other: None IMPRESSION: Newly seen acute infarction within the right occipital lobe. No sign of acute hemorrhage related to that. Enlargement of the previously seen acute infarction in the right posterior frontal cortical and subcortical brain, approximately doubled in volume compared to the study of 2 days ago. Persistent evidence of subarachnoid blood in the sulci on the right. No developing hydrocephalus. In addition to the possibility of embolic infarctions, one could consider spasm and vasculitis as possible etiologies of these recent changes. Electronically Signed   By: Nelson Chimes M.D.   On: 03/10/2021 13:33   MR BRAIN W WO CONTRAST  Result Date: 03/08/2021 CLINICAL DATA:  Subarachnoid  hemorrhage. Active herpes zoster. Acute presentation with weakness yesterday. Weakness is left-sided. EXAM: MRI HEAD WITHOUT AND WITH CONTRAST TECHNIQUE: Multiplanar, multiecho pulse sequences of the brain and surrounding structures were obtained without and with intravenous contrast. CONTRAST:  72mL GADAVIST GADOBUTROL 1 MMOL/ML IV SOLN COMPARISON:  CT studies same day.  MRI 06/17/2017. FINDINGS: Brain: Diffusion imaging shows acute infarction at the right posterior frontal region affecting the gyrus and underlying white matter. Focal hemorrhage in this location which resulted in subarachnoid extension. No other acute infarction. Elsewhere, there chronic small-vessel ischemic changes of the pons and cerebellum. Punctate foci of hemosiderin deposition related to old microhemorrhages. Cerebral hemispheres elsewhere show chronic small-vessel ischemic change of the white matter with scattered micro hemorrhagic hemosiderin deposition. No extra-axial collection. No hydrocephalus. No neoplastic mass lesion. Vascular: Major vessels at the base of the brain show flow. 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 This hemorrhage has undergone subarachnoid extension and there is subarachnoid blood in the sulci of the right sylvian fissure and posterior frontal region. Chronic small-vessel ischemic changes elsewhere throughout the brain as outlined above, many associated with punctate hemosiderin deposition related to old microhemorrhage. Electronically Signed   By: Nelson Chimes M.D.   On: 03/08/2021 15:32   IR US Guide Vasc Access Right  Result Date: 03/10/2021 CLINICAL DATA:  Right posterior frontal temporal subarachnoid hemorrhage EXAM: BILATERAL COMMON CAROTID AND INNOMINATE ANGIOGRAPHY COMPARISON:  CTA head and neck 03/08/21. MEDICATIONS: Heparin 2000  units IV. None antibiotic was administered within 1 hour of the procedure. ANESTHESIA/SEDATION: Versed 1 mg IV; Fentanyl 25 mcg IV Moderate Sedation Time:  55 minutes The patient was continuously monitored during the procedure by the interventional radiology nurse under my direct supervision. CONTRAST:  Omnipaque 300 approximately 65 mL. FLUOROSCOPY TIME:  Fluoroscopy Time: 13 minutes 24 seconds (1688 mGy). COMPLICATIONS: None immediate. TECHNIQUE: Informed written consent was obtained from the patient after a thorough discussion of the procedural risks, benefits and alternatives. All questions were addressed. Maximal Sterile Barrier Technique was utilized including caps, mask, sterile gowns, sterile gloves, sterile drape, hand hygiene and skin antiseptic. A timeout was performed prior to the initiation of the procedure. The right forearm to the wrist was prepped and draped in the usual sterile manner. The right radial artery was then identified with ultrasound, and its morphology documented permanently in the radiology PACS system. A dorsal palmar anastomosis was verified to be present. Using ultrasound guidance, access into the right radial artery was obtained over a 0.018 inch micro guidewire. A 4/5 French radial sheath was then inserted. The micro guidewire, and the obturator were removed. Good aspiration obtained from the hub of sheath. A cocktail of 2000 units of heparin, 2.5 mg of verapamil, and 200 mcg of nitroglycerin was then infused through the sheath in diluted form without event. A right radial arteriogram was performed. Over a 0.035 inch Roadrunner guidewire, a 5 [place] Simmons 2 diagnostic catheter was then advanced to the aortic arch region and selectively positioned in the left common carotid artery, the left vertebral artery, the right common carotid artery and the right vertebral artery. A wrist band was applied for hemostasis at the right radial puncture site. The distal right radial pulse was  verified to be present. FINDINGS: The left common carotid arteriogram demonstrates the left external carotid artery and its major branches to be widely patent. The left internal carotid artery at the bulb to the cranial skull base is widely patent. The petrous, the cavernous and the supraclinoid segments demonstrate wide patency. A focal outpouching is seen in the left posterior communicating artery region probably representing a small infundibulum. Atherosclerotic change with a small focal outpouching is seen at the caval cavernous segment anteriorly. The left middle cerebral artery and the left anterior cerebral artery opacify into the capillary and venous phases. The left vertebral artery origin is widely patent. The vessel is seen to opacify to the cranial skull base. Wide patency is seen of the left vertebrobasilar junction and the left posterior-inferior cerebellar artery. The opacified portion of the basilar artery, the posterior cerebral arteries and the superior cerebellar arteries demonstrates wide patency. Unopacified blood is seen in the basilar artery from contralateral vertebral artery. The right common carotid arteriogram demonstrates the right external carotid artery and its major branches to be widely patent. The right internal carotid artery at the bulb to the cranial skull base demonstrates wide patency as well. The petrous, the cavernous and the supraclinoid segments remain widely patent. The right middle cerebral artery and the right anterior cerebral artery opacify into the capillary and venous phases. Multiple magnified oblique views were obtained of the right middle cerebral artery and the peri insular regions. No evidence of spasm, intraluminal filling defects, occlusions, aneurysms, or arteriovenous shunting is evident. The cortical veins also demonstrated no stasis or filling defects in the region of the subarachnoid hemorrhage overlying the right frontal temporal convexities. The right  vertebral artery origin is widely patent. The vessel is seen to opacify to the cranial skull base. Patency is seen of the right posterior-inferior cerebellar artery and the vertebrobasilar junction. The basilar artery, the posterior  cerebral arteries, the superior cerebellar arteries and the anterior-inferior cerebellar arteries demonstrate patency into the capillary and venous phases. IMPRESSION: Angiographically no evidence of dissections, intraluminal filling defects, arteriovenous shunting, dural AV fistula, AV malformation, or of aneurysm seen on this study. PLAN: Suggest CT angiogram of the head and neck in approximately 6-8 weeks. Electronically Signed   By: Luanne Bras M.D.   On: 03/10/2021 08:20   VAS [place] ABI WITH/WO TBI  Result Date: 02/24/2021  LOWER EXTREMITY DOPPLER STUDY Patient Name:  ATIF ZAPPONE  Date of Exam:   02/23/2021 Medical Rec #: [id number]     Accession #:    [id number] Date of Birth: 1956/03/06     Patient Gender: M Patient Age:   77 years Exam Location:  [facility] Procedure:      VAS [place] ABI WITH/WO TBI Referring Phys: HOYT STONE III --------------------------------------------------------------------------------  Indications: Ulceration.  Performing Technologist: Ralene Cork RVT  Examination Guidelines: A complete evaluation includes at minimum, Doppler waveform signals and systolic blood pressure reading at the level of bilateral brachial, anterior tibial, and posterior tibial arteries, when vessel segments are accessible. Bilateral testing is considered an integral part of a complete examination. Photoelectric Plethysmograph (PPG) waveforms and toe systolic pressure readings are included as required and additional duplex testing as needed. Limited examinations for reoccurring indications may be performed as noted.  ABI Findings: +---------+------------------+-----+---------+--------+  Right     Rt Pressure (mmHg) Index Waveform  Comment    +---------+------------------+-----+---------+--------+  Brachial  184                                          +---------+------------------+-----+---------+--------+  PTA       255                1.39  triphasic           +---------+------------------+-----+---------+--------+  DP        255                1.39  triphasic           +---------+------------------+-----+---------+--------+  Great Toe 174                0.95                      +---------+------------------+-----+---------+--------+ +---------+------------------+-----+---------+-------+  Left      Lt Pressure (mmHg) Index Waveform  Comment  +---------+------------------+-----+---------+-------+  Brachial  180                                         +---------+------------------+-----+---------+-------+  PTA       255                1.39  triphasic          +---------+------------------+-----+---------+-------+  DP        255                1.39  triphasic          +---------+------------------+-----+---------+-------+  Great Toe 196                1.07                     +---------+------------------+-----+---------+-------+ +-------+-----------+-----------+------------+------------+  ABI/TBI Today's ABI Today's TBI Previous ABI Previous TBI  +-------+-----------+-----------+------------+------------+  Right   [place]          0.95                                   +-------+-----------+-----------+------------+------------+  Left    [place]          1.07                                   +-------+-----------+-----------+------------+------------+  No previous ABI.  Summary: Right: Resting right ankle-brachial index indicates noncompressible right lower extremity arteries. The right toe-brachial index is normal. Pedal waveforms are normal. Left: Resting left ankle-brachial index indicates noncompressible left lower extremity arteries. The left toe-brachial index is normal. Pedal waveforms are normal.  *See table(s) above for measurements and observations.   Electronically signed by Orlie Pollen on 02/24/2021 at 5:44:06 PM.    Final    ECHOCARDIOGRAM COMPLETE BUBBLE STUDY  Result Date: 03/11/2021    ECHOCARDIOGRAM REPORT   Patient Name:   TILLIE VELDKAMP Date of Exam: 03/11/2021 Medical Rec #:  [id number]    Height:       73.0 in Accession #:    [id number]   Weight:       289.0 lb Date of Birth:  08-31-1955    BSA:          2.516 m Patient Age:    48 years     BP:           143/67 mmHg Patient Gender: M            HR:           69 bpm. Exam Location:  Inpatient Procedure: 2D Echo, Cardiac Doppler, Color Doppler, Intracardiac Opacification            Agent and Saline Contrast Bubble Study Indications:    Stroke  History:        Patient has no prior history of Echocardiogram examinations.  Sonographer:    Glo Herring Referring Phys: [id number] [place]  1. Left ventricular ejection fraction, by estimation, is 45 to 50%. The left ventricle has mildly decreased function. The left ventricle demonstrates regional wall motion abnormalities (see scoring diagram/findings for description). There is moderate concentric left ventricular hypertrophy. Left ventricular diastolic parameters are consistent with Grade II diastolic dysfunction (pseudonormalization). No LV mural thrombus.  2. Right ventricular systolic function is normal. The right ventricular size is normal. Tricuspid regurgitation signal is inadequate for assessing PA pressure.  3. A small to moderate pericardial effusion is present. The pericardial effusion is circumferential. There is no evidence of cardiac tamponade.  4. The mitral valve is grossly normal. Trivial mitral valve regurgitation.  5. The aortic valve is tricuspid. Aortic valve regurgitation is not visualized. Aortic valve sclerosis is present, with no evidence of aortic valve stenosis. Aortic valve mean gradient measures 3.0 mmHg.  6. The inferior vena cava is dilated in size with <50% respiratory variability, suggesting right  atrial pressure of 15 mmHg.  7. Agitated saline contrast bubble study was negative, with no evidence of any interatrial shunt. Comparison(s): No prior Echocardiogram. FINDINGS  Left Ventricle: Left ventricular ejection fraction, by estimation, is 45 to 50%. The left ventricle has mildly decreased function. The left ventricle demonstrates regional wall motion abnormalities.  Definity contrast agent was given IV to delineate the left ventricular endocardial borders. The left ventricular internal cavity size was normal in size. There is moderate concentric left ventricular hypertrophy. Left ventricular diastolic parameters are consistent with Grade II diastolic dysfunction (pseudonormalization).  LV Wall Scoring: The apical septal segment, apical inferior segment, and apex are akinetic. The apical lateral segment, mid inferoseptal segment, apical anterior segment, and mid inferior segment are hypokinetic. The anterior wall, antero-lateral wall, basal inferior segment, and basal inferoseptal segment are normal. Right Ventricle: The right ventricular size is normal. No increase in right ventricular wall thickness. Right ventricular systolic function is normal. Tricuspid regurgitation signal is inadequate for assessing PA pressure. Left Atrium: Left atrial size was normal in size. Right Atrium: Right atrial size was normal in size. Pericardium: A small pericardial effusion is present. The pericardial effusion is circumferential. There is no evidence of cardiac tamponade. Mitral Valve: The mitral valve is grossly normal. Mild mitral annular calcification. Trivial mitral valve regurgitation. Tricuspid Valve: The tricuspid valve is grossly normal. Tricuspid valve regurgitation is trivial. Aortic Valve: The aortic valve is tricuspid. There is mild aortic valve annular calcification. Aortic valve regurgitation is not visualized. Aortic valve sclerosis is present, with no evidence of aortic valve stenosis. Aortic valve mean  gradient measures  3.0 mmHg. Aortic valve peak gradient measures 7.1 mmHg. Aortic valve area, by VTI measures 2.66 cm. Pulmonic Valve: The pulmonic valve was grossly normal. Pulmonic valve regurgitation is trivial. Aorta: The aortic root is normal in size and structure. Venous: The inferior vena cava is dilated in size with less than 50% respiratory variability, suggesting right atrial pressure of 15 mmHg. IAS/Shunts: No atrial level shunt detected by color flow Doppler. Agitated saline contrast was given intravenously to evaluate for intracardiac shunting. Agitated saline contrast bubble study was negative, with no evidence of any interatrial shunt.  LEFT VENTRICLE PLAX 2D LVIDd:         4.90 cm      Diastology LVIDs:         2.37 cm      LV e' medial:    4.51 cm/s LV PW:         1.45 cm      LV E/e' medial:  20.2 LV IVS:        1.45 cm      LV e' lateral:   6.95 cm/s LVOT diam:     2.00 cm      LV E/e' lateral: 13.1 LV SV:         68 LV SV Index:   27 LVOT Area:     3.14 cm  LV Volumes (MOD) LV vol d, MOD A2C: 166.0 ml LV vol d, MOD A4C: 233.0 ml LV vol s, MOD A2C: 77.2 ml LV vol s, MOD A4C: 82.9 ml LV SV MOD A2C:     88.8 ml LV SV MOD A4C:     233.0 ml LV SV MOD BP:      128.1 ml RIGHT VENTRICLE            IVC RV Basal diam:  4.50 cm    IVC diam: 2.90 cm RV S prime:     9.30 cm/s LEFT ATRIUM              Index        RIGHT ATRIUM           Index LA diam:        4.90 cm  1.95  cm/m   RA Area:     24.10 cm LA Vol (A2C):   111.0 ml 44.12 ml/m  RA Volume:   75.80 ml  30.13 ml/m LA Vol (A4C):   74.5 ml  29.61 ml/m LA Biplane Vol: 92.4 ml  36.72 ml/m  AORTIC VALVE                    PULMONIC VALVE AV Area (Vmax):    2.39 cm     PV Vmax:       0.95 m/s AV Area (Vmean):   2.51 cm     PV Peak grad:  3.6 mmHg AV Area (VTI):     2.66 cm AV Vmax:           133.00 cm/s AV Vmean:          84.800 cm/s AV VTI:            0.257 m AV Peak Grad:      7.1 mmHg AV Mean Grad:      3.0 mmHg LVOT Vmax:         101.00 cm/s LVOT  Vmean:        67.700 cm/s LVOT VTI:          0.218 m LVOT/AV VTI ratio: 0.85  AORTA Ao Root diam: 3.20 cm Ao Asc diam:  2.90 cm MITRAL VALVE MV Area (PHT): 3.51 cm    SHUNTS MV Decel Time: 216 msec    Systemic VTI:  0.22 m MV E velocity: 91.20 cm/s  Systemic Diam: 2.00 cm MV A velocity: 85.40 cm/s MV E/A ratio:  1.07 Rozann Lesches MD Electronically signed by Rozann Lesches MD Signature Date/Time: 03/11/2021/3:32:14 PM    Final    CT HEAD CODE STROKE WO CONTRAST  Result Date: 03/08/2021 CLINICAL DATA:  Code stroke. Neurological deficit. Acute stroke suspected. Left-sided weakness. EXAM: CT HEAD WITHOUT CONTRAST TECHNIQUE: Contiguous axial images were obtained from the base of the skull through the vertex without intravenous contrast. COMPARISON:  MRI 06/17/2017.  CT studies 06/16/2017. FINDINGS: Brain: The brainstem and cerebellum are normal. Mild chronic small-vessel ischemic changes are seen within the cerebral hemispheric white matter. There is acute subarachnoid hemorrhage within the sulci of the right sylvian fissure and posterior frontal region. There appears to be a small intraparenchymal hemorrhage measuring 4 mm in size in the right posterior frontal region that may be the source of this hemorrhage. CT angiography in 2019 did not show an aneurysm. No hydrocephalus. No extra-axial collection. Vascular: There is atherosclerotic calcification of the major vessels at the base of the brain. Skull: Negative Sinuses/Orbits: Clear/normal Other: None ASPECTS ([place] Stroke Program Early CT Score) - Ganglionic level infarction (caudate, lentiform nuclei, internal capsule, insula, M1-M3 cortex): 7 - Supraganglionic infarction (M4-M6 cortex): 3 Total score (0-10 with 10 being normal): 10 IMPRESSION: 1. 4 mm intraparenchymal hematoma in the right posterior frontal lobe, apparently with subarachnoid penetration. Subarachnoid blood within the sulci of the right posterior frontal region and right sylvian fissure.  2. This pattern of hemorrhage would ordinarily raise concern about a berry aneurysm, but no aneurysm was seen on CT angiography 06/16/2017. Additionally, the suspected presence of a intraparenchymal hemorrhage with subarachnoid extension could explain this pattern. 3. ASPECTS is 10 4. These results were communicated to Dr. Quinn Axe At 9:19 am on 03/08/2021 by text page via the [facility] messaging system. Electronically Signed   By: Nelson Chimes M.D.   On: 03/08/2021 09:21   VAS [place] LOWER  EXTREMITY VENOUS (DVT)  Result Date: 03/13/2021  Lower Venous DVT Study Patient Name:  ARYA HICKEN  Date of Exam:   03/12/2021 Medical Rec #: [id number]     Accession #:    [id number] Date of Birth: July 07, 1955     Patient Gender: M Patient Age:   68 years Exam Location:  [facility] Procedure:      VAS [place] LOWER EXTREMITY VENOUS (DVT) Referring Phys: Cornelius Moras XU --------------------------------------------------------------------------------  Indications: Stroke.  Risk Factors: None identified. Limitations: Body habitus and poor ultrasound/tissue interface. Comparison Study: No prior studies. Performing Technologist: Oliver Hum RVT  Examination Guidelines: A complete evaluation includes B-mode imaging, spectral Doppler, color Doppler, and power Doppler as needed of all accessible portions of each vessel. Bilateral testing is considered an integral part of a complete examination. Limited examinations for reoccurring indications may be performed as noted. The reflux portion of the exam is performed with the patient in reverse Trendelenburg.  +---------+---------------+---------+-----------+----------+--------------+  RIGHT     Compressibility Phasicity Spontaneity Properties Thrombus Aging  +---------+---------------+---------+-----------+----------+--------------+  CFV       Full            Yes       Yes                                    +---------+---------------+---------+-----------+----------+--------------+  SFJ        Full                                                             +---------+---------------+---------+-----------+----------+--------------+  FV Prox   Full                                                             +---------+---------------+---------+-----------+----------+--------------+  FV Mid    Full                                                             +---------+---------------+---------+-----------+----------+--------------+  FV Distal                 Yes       Yes                                    +---------+---------------+---------+-----------+----------+--------------+  PFV       Full                                                             +---------+---------------+---------+-----------+----------+--------------+  POP       Full  Yes       Yes                                    +---------+---------------+---------+-----------+----------+--------------+  PTV       Full                                                             +---------+---------------+---------+-----------+----------+--------------+  PERO      Full                                                             +---------+---------------+---------+-----------+----------+--------------+   +---------+---------------+---------+-----------+----------+-------------------+  LEFT      Compressibility Phasicity Spontaneity Properties Thrombus Aging       +---------+---------------+---------+-----------+----------+-------------------+  CFV       Full            Yes       Yes                                         +---------+---------------+---------+-----------+----------+-------------------+  SFJ       Full                                                                  +---------+---------------+---------+-----------+----------+-------------------+  FV Prox   Full                                                                  +---------+---------------+---------+-----------+----------+-------------------+  FV Mid     Full                                                                  +---------+---------------+---------+-----------+----------+-------------------+  FV Distal Full                                                                  +---------+---------------+---------+-----------+----------+-------------------+  PFV       Full                                                                  +---------+---------------+---------+-----------+----------+-------------------+  POP       Full            Yes       Yes                                         +---------+---------------+---------+-----------+----------+-------------------+  PTV       Full                                                                  +---------+---------------+---------+-----------+----------+-------------------+  PERO                                                       Not well visualized  +---------+---------------+---------+-----------+----------+-------------------+     Summary: RIGHT: - There is no evidence of deep vein thrombosis in the lower extremity. However, portions of this examination were limited- see technologist comments above.  - No cystic structure found in the popliteal fossa.  LEFT: - There is no evidence of deep vein thrombosis in the lower extremity. However, portions of this examination were limited- see technologist comments above.  - No cystic structure found in the popliteal fossa.  *See table(s) above for measurements and observations. Electronically signed by Jamelle Haring on 03/13/2021 at 9:30:55 AM.    Final    CT ANGIO HEAD NECK W WO CM (CODE STROKE)  Result Date: 03/08/2021 CLINICAL DATA:  Provided history: Neuro deficit, acute, stroke suspected. Left-sided weakness. EXAM: CT ANGIOGRAPHY HEAD AND NECK TECHNIQUE: Multidetector CT imaging of the head and neck was performed using the standard protocol during bolus administration of intravenous contrast. Multiplanar CT image reconstructions and MIPs were  obtained to evaluate the vascular anatomy. Carotid stenosis measurements (when applicable) are obtained utilizing NASCET criteria, using the distal internal carotid diameter as the denominator. CONTRAST:  57mL OMNIPAQUE IOHEXOL 350 MG/ML SOLN COMPARISON:  Noncontrast head CT performed earlier today 03/08/2021. CT angiogram head/neck 06/16/2017. FINDINGS: CTA NECK FINDINGS Aortic arch: Standard aortic branching. Atherosclerotic plaque within the visualized aortic arch and proximal major branch vessels of the neck. Streak and beam hardening artifact arising from a dense right-sided contrast bolus partially obscures the right subclavian artery. Within limitation, no hemodynamically significant innominate or proximal subclavian artery stenosis is identified. Right carotid system: CCA and ICA patent within the neck without stenosis. Mild atherosclerotic plaque about the carotid bifurcation. Left carotid system: CCA and ICA patent within the neck without stenosis. Mild sclerotic plaque within the mid to distal CCA and about the carotid bifurcation. Vertebral arteries: Vertebral arteries codominant and patent within the neck. Nonstenotic calcified plaque at the origin of the right vertebral artery. 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 Skeleton: Nonspecific heterogeneous, mildly sclerotic appearance of the bones, similar as compared to the prior examination of 06/16/2017. Cervical spondylosis. Mild reversal of the expected cervical lordosis. 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 10 mm cutaneous/subcutaneous lesion within the right cheek soft tissues (overlying the anterior aspect of the right parotid gland), increased in size as compared to the prior CTA (for instance as seen on series 6, image 216). Upper chest: No consolidation within  the imaged lung apices. Review of the MIP images confirms the above findings CTA HEAD FINDINGS Anterior circulation: The intracranial internal carotid arteries are patent. Nonstenotic calcified plaque within both vessels. The M1 middle cerebral arteries are patent. Atherosclerotic irregularity of the M2 and more distal middle cerebral arteries, bilaterally. However, no M2 proximal branch occlusion or high-grade proximal stenosis is identified. The anterior cerebral arteries are patent. 1-2 mm inferiorly projecting vascular protrusion arising from the paraclinoid left ICA, which may reflect and infundibulum or aneurysm (series 6, image 248) (series 10, image 113). This is unchanged from the prior examination of 06/16/2017. No evidence of aneurysm or AVM elsewhere. Posterior circulation: The intracranial vertebral arteries are patent. The basilar artery is patent. The posterior cerebral arteries are patent. As before, there are sites of up to severe stenosis within the P2 left PCA. Posterior communicating arteries are diminutive or absent bilaterally. Venous sinuses: Within the limitations of contrast timing, no convincing thrombus. Anatomic variants: As described. Review of the MIP images confirms the above findings CTA head impressions #1 and #2 called by telephone at the time of interpretation on 03/08/2021 at 9:54 am to provider [facility] , who verbally acknowledged these results. IMPRESSION: CTA neck: 1. The common carotid and internal carotid arteries are patent within the neck without stenosis. Mild atherosclerotic plaque within the bilateral carotid systems within the neck, as described. 2. Vertebral arteries patent within the neck. 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 3. 2 cm right parotid gland mass, not significantly changed in size as compared to the prior CTA head/neck of 06/16/2017. 4. 10 mm cutaneous/subcutaneous lesion within the right cheek soft tissues  (overlying the anterior right parotid gland), increased in size. Direct visualization recommended (with direct tissue sampling, as warranted). 5.  Aortic Atherosclerosis (ICD10-I70.0). CTA head: 1. No intracranial large vessel occlusion or proximal high-grade arterial stenosis. 2. 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 This is remote from the site of subarachnoid and parenchymal hemorrhage. 3. Similar to the prior CTA, there are sites of up to severe stenosis within the P2 left PCA. Electronically Signed   By: Kellie Simmering D.O.   On: 03/08/2021 09:57   IR ANGIO INTRA EXTRACRAN SEL COM CAROTID INNOMINATE BILAT MOD SED  Result Date: 03/10/2021 CLINICAL DATA:  Right posterior frontal temporal subarachnoid hemorrhage EXAM: BILATERAL COMMON CAROTID AND INNOMINATE ANGIOGRAPHY COMPARISON:  CTA head and neck 03/08/21. MEDICATIONS: Heparin 2000 units IV. None antibiotic was administered within 1 hour of the procedure. ANESTHESIA/SEDATION: Versed 1 mg IV; Fentanyl 25 mcg IV Moderate Sedation Time:  55 minutes The patient was continuously monitored during the procedure by the interventional radiology nurse under my direct supervision. CONTRAST:  Omnipaque 300 approximately 65 mL. FLUOROSCOPY TIME:  Fluoroscopy Time: 13 minutes 24 seconds (1688 mGy). COMPLICATIONS: None immediate. TECHNIQUE: Informed written consent was obtained from the patient after a thorough discussion of the procedural risks, benefits and alternatives. All questions were addressed. Maximal Sterile Barrier Technique was utilized including caps, mask, sterile gowns, sterile gloves, sterile drape, hand hygiene and skin antiseptic. A timeout was performed prior to the initiation of the procedure. The right  forearm to the wrist was prepped and draped in the usual sterile manner. The right radial artery was then identified with ultrasound, and its morphology documented  permanently in the radiology PACS system. A dorsal palmar anastomosis was verified to be present. Using ultrasound guidance, access into the right radial artery was obtained over a 0.018 inch micro guidewire. A 4/5 French radial sheath was then inserted. The micro guidewire, and the obturator were removed. Good aspiration obtained from the hub of sheath. A cocktail of 2000 units of heparin, 2.5 mg of verapamil, and 200 mcg of nitroglycerin was then infused through the sheath in diluted form without event. A right radial arteriogram was performed. Over a 0.035 inch Roadrunner guidewire, a 5 [place] Simmons 2 diagnostic catheter was then advanced to the aortic arch region and selectively positioned in the left common carotid artery, the left vertebral artery, the right common carotid artery and the right vertebral artery. A wrist band was applied for hemostasis at the right radial puncture site. The distal right radial pulse was verified to be present. FINDINGS: The left common carotid arteriogram demonstrates the left external carotid artery and its major branches to be widely patent. The left internal carotid artery at the bulb to the cranial skull base is widely patent. The petrous, the cavernous and the supraclinoid segments demonstrate wide patency. A focal outpouching is seen in the left posterior communicating artery region probably representing a small infundibulum. Atherosclerotic change with a small focal outpouching is seen at the caval cavernous segment anteriorly. The left middle cerebral artery and the left anterior cerebral artery opacify into the capillary and venous phases. The left vertebral artery origin is widely patent. The vessel is seen to opacify to the cranial skull base. Wide patency is seen of the left vertebrobasilar junction and the left posterior-inferior cerebellar artery. The opacified portion of the basilar artery, the posterior cerebral arteries and the superior cerebellar arteries  demonstrates wide patency. Unopacified blood is seen in the basilar artery from contralateral vertebral artery. The right common carotid arteriogram demonstrates the right external carotid artery and its major branches to be widely patent. The right internal carotid artery at the bulb to the cranial skull base demonstrates wide patency as well. The petrous, the cavernous and the supraclinoid segments remain widely patent. The right middle cerebral artery and the right anterior cerebral artery opacify into the capillary and venous phases. Multiple magnified oblique views were obtained of the right middle cerebral artery and the peri insular regions. No evidence of spasm, intraluminal filling defects, occlusions, aneurysms, or arteriovenous shunting is evident. The cortical veins also demonstrated no stasis or filling defects in the region of the subarachnoid hemorrhage overlying the right frontal temporal convexities. The right vertebral artery origin is widely patent. The vessel is seen to opacify to the cranial skull base. Patency is seen of the right posterior-inferior cerebellar artery and the vertebrobasilar junction. The basilar artery, the posterior cerebral arteries, the superior cerebellar arteries and the anterior-inferior cerebellar arteries demonstrate patency into the capillary and venous phases. IMPRESSION: Angiographically no evidence of dissections, intraluminal filling defects, arteriovenous shunting, dural AV fistula, AV malformation, or of aneurysm seen on this study. PLAN: Suggest CT angiogram of the head and neck in approximately 6-8 weeks. Electronically Signed   By: Luanne Bras M.D.   On: 03/10/2021 08:20   IR ANGIO VERTEBRAL SEL VERTEBRAL BILAT MOD SED  Result Date: 03/10/2021 CLINICAL DATA:  Right posterior frontal temporal subarachnoid hemorrhage EXAM: BILATERAL COMMON CAROTID AND  INNOMINATE ANGIOGRAPHY COMPARISON:  CTA head and neck 03/08/21. MEDICATIONS: Heparin 2000 units IV.  None antibiotic was administered within 1 hour of the procedure. ANESTHESIA/SEDATION: Versed 1 mg IV; Fentanyl 25 mcg IV Moderate Sedation Time:  55 minutes The patient was continuously monitored during the procedure by the interventional radiology nurse under my direct supervision. CONTRAST:  Omnipaque 300 approximately 65 mL. FLUOROSCOPY TIME:  Fluoroscopy Time: 13 minutes 24 seconds (1688 mGy). COMPLICATIONS: None immediate. TECHNIQUE: Informed written consent was obtained from the patient after a thorough discussion of the procedural risks, benefits and alternatives. All questions were addressed. Maximal Sterile Barrier Technique was utilized including caps, mask, sterile gowns, sterile gloves, sterile drape, hand hygiene and skin antiseptic. A timeout was performed prior to the initiation of the procedure. The right forearm to the wrist was prepped and draped in the usual sterile manner. The right radial artery was then identified with ultrasound, and its morphology documented permanently in the radiology PACS system. A dorsal palmar anastomosis was verified to be present. Using ultrasound guidance, access into the right radial artery was obtained over a 0.018 inch micro guidewire. A 4/5 French radial sheath was then inserted. The micro guidewire, and the obturator were removed. Good aspiration obtained from the hub of sheath. A cocktail of 2000 units of heparin, 2.5 mg of verapamil, and 200 mcg of nitroglycerin was then infused through the sheath in diluted form without event. A right radial arteriogram was performed. Over a 0.035 inch Roadrunner guidewire, a 5 [place] Simmons 2 diagnostic catheter was then advanced to the aortic arch region and selectively positioned in the left common carotid artery, the left vertebral artery, the right common carotid artery and the right vertebral artery. A wrist band was applied for hemostasis at the right radial puncture site. The distal right radial pulse was verified to  be present. FINDINGS: The left common carotid arteriogram demonstrates the left external carotid artery and its major branches to be widely patent. The left internal carotid artery at the bulb to the cranial skull base is widely patent. The petrous, the cavernous and the supraclinoid segments demonstrate wide patency. A focal outpouching is seen in the left posterior communicating artery region probably representing a small infundibulum. Atherosclerotic change with a small focal outpouching is seen at the caval cavernous segment anteriorly. The left middle cerebral artery and the left anterior cerebral artery opacify into the capillary and venous phases. The left vertebral artery origin is widely patent. The vessel is seen to opacify to the cranial skull base. Wide patency is seen of the left vertebrobasilar junction and the left posterior-inferior cerebellar artery. The opacified portion of the basilar artery, the posterior cerebral arteries and the superior cerebellar arteries demonstrates wide patency. Unopacified blood is seen in the basilar artery from contralateral vertebral artery. The right common carotid arteriogram demonstrates the right external carotid artery and its major branches to be widely patent. The right internal carotid artery at the bulb to the cranial skull base demonstrates wide patency as well. The petrous, the cavernous and the supraclinoid segments remain widely patent. The right middle cerebral artery and the right anterior cerebral artery opacify into the capillary and venous phases. Multiple magnified oblique views were obtained of the right middle cerebral artery and the peri insular regions. No evidence of spasm, intraluminal filling defects, occlusions, aneurysms, or arteriovenous shunting is evident. The cortical veins also demonstrated no stasis or filling defects in the region of the subarachnoid hemorrhage overlying the right frontal temporal convexities.  The right vertebral  artery origin is widely patent. The vessel is seen to opacify to the cranial skull base. Patency is seen of the right posterior-inferior cerebellar artery and the vertebrobasilar junction. The basilar artery, the posterior cerebral arteries, the superior cerebellar arteries and the anterior-inferior cerebellar arteries demonstrate patency into the capillary and venous phases. IMPRESSION: Angiographically no evidence of dissections, intraluminal filling defects, arteriovenous shunting, dural AV fistula, AV malformation, or of aneurysm seen on this study. PLAN: Suggest CT angiogram of the head and neck in approximately 6-8 weeks. Electronically Signed   By: Luanne Bras M.D.   On: 03/10/2021 08:20    Microbiology: Results for orders placed or performed during the hospital encounter of 03/08/21  MRSA Next Gen by PCR, Nasal     Status: Abnormal   Collection Time: 03/08/21 12:39 PM   Specimen: Nasal Mucosa; Nasal Swab  Result Value Ref Range Status   MRSA by PCR Next Gen DETECTED (A) NOT DETECTED Final    Comment: RESULT CALLED TO, READ BACK BY AND VERIFIED WITH: RN J BASTABLE [id number] [phone] (NOTE) The GeneXpert MRSA Assay (FDA approved for NASAL specimens only), is one component of a comprehensive MRSA colonization surveillance program. It is not intended to diagnose MRSA infection nor to guide or monitor treatment for MRSA infections. Test performance is not FDA approved in patients less than 17 years old. Performed at [facility], [place] [address], [place], [place] [address]     Labs: CBC: Recent Labs  Lab 03/11/21 0344 03/12/21 0622 03/13/21 0438  WBC 5.5 5.7 5.5  HGB 13.4 13.1 13.9  HCT 40.6 39.3 40.9  MCV 95.3 95.9 94.7  PLT 151 144* 0000000   Basic Metabolic Panel: Recent Labs  Lab 03/11/21 0344 03/12/21 0622 03/13/21 0438  NA 137 137 139  K 4.1 3.9 4.1  CL 105 104 105  CO2 23 26 25   GLUCOSE 274* 233* 204*  BUN 19 17 15   CREATININE 0.92 0.92 0.80  CALCIUM  8.6* 8.9 8.7*   Liver Function Tests: No results for input(s): AST, ALT, ALKPHOS, BILITOT, PROT, ALBUMIN in the last 168 hours. CBG: Recent Labs  Lab 03/16/21 1121 03/16/21 1606 03/16/21 2127 03/17/21 0741 03/17/21 1144  GLUCAP 268* 248* 228* 200* 296*    Discharge time spent: less than 30 minutes.  Signed:  Annita Brod MD.  [facility] 03/17/2021

## 2021-03-18 LAB — CBC WITH DIFFERENTIAL/PLATELET
Abs Immature Granulocytes: 0.02 10*3/uL (ref 0.00–0.07)
Basophils Absolute: 0.1 10*3/uL (ref 0.0–0.1)
Basophils Relative: 1 %
Eosinophils Absolute: 0.2 10*3/uL (ref 0.0–0.5)
Eosinophils Relative: 3 %
HCT: 40.4 % (ref 39.0–52.0)
Hemoglobin: 13.6 g/dL (ref 13.0–17.0)
Immature Granulocytes: 0 %
Lymphocytes Relative: 22 %
Lymphs Abs: 1.4 10*3/uL (ref 0.7–4.0)
MCH: 31.7 pg (ref 26.0–34.0)
MCHC: 33.7 g/dL (ref 30.0–36.0)
MCV: 94.2 fL (ref 80.0–100.0)
Monocytes Absolute: 0.6 10*3/uL (ref 0.1–1.0)
Monocytes Relative: 10 %
Neutro Abs: 4 10*3/uL (ref 1.7–7.7)
Neutrophils Relative %: 64 %
Platelets: 201 10*3/uL (ref 150–400)
RBC: 4.29 MIL/uL (ref 4.22–5.81)
RDW: 13.4 % (ref 11.5–15.5)
WBC: 6.3 10*3/uL (ref 4.0–10.5)
nRBC: 0 % (ref 0.0–0.2)

## 2021-03-18 LAB — COMPREHENSIVE METABOLIC PANEL
ALT: 12 U/L (ref 0–44)
AST: 18 U/L (ref 15–41)
Albumin: 2.6 g/dL — ABNORMAL LOW (ref 3.5–5.0)
Alkaline Phosphatase: 84 U/L (ref 38–126)
Anion gap: 7 (ref 5–15)
BUN: 16 mg/dL (ref 8–23)
CO2: 29 mmol/L (ref 22–32)
Calcium: 8.9 mg/dL (ref 8.9–10.3)
Chloride: 102 mmol/L (ref 98–111)
Creatinine, Ser: 0.92 mg/dL (ref 0.61–1.24)
GFR, Estimated: >60 mL/min (ref >60)
Glucose, Bld: 189 mg/dL — ABNORMAL HIGH (ref 70–99)
Potassium: 4 mmol/L (ref 3.5–5.1)
Sodium: 138 mmol/L (ref 135–145)
Total Bilirubin: 0.7 mg/dL (ref 0.3–1.2)
Total Protein: 6.9 g/dL (ref 6.5–8.1)

## 2021-03-18 LAB — GLUCOSE, CAPILLARY
Glucose-Capillary: 185 mg/dL — ABNORMAL HIGH (ref 70–99)
Glucose-Capillary: 226 mg/dL — ABNORMAL HIGH (ref 70–99)
Glucose-Capillary: 174 mg/dL — ABNORMAL HIGH (ref 70–99)
Glucose-Capillary: 151 mg/dL — ABNORMAL HIGH (ref 70–99)
Glucose-Capillary: 184 mg/dL — ABNORMAL HIGH (ref 70–99)
Glucose-Capillary: 199 mg/dL — ABNORMAL HIGH (ref 70–99)

## 2021-03-18 MED ORDER — VITAMIN D 25 MCG (1000 UNIT) PO TABS
1000.0000 [IU] | ORAL_TABLET | Freq: Every day | ORAL | Status: DC
  Administered 2021-03-18 – 2021-04-18 (×32): 1000 [IU] via ORAL
  Filled 2021-03-18 (×32): qty 1

## 2021-03-18 NOTE — Plan of Care (Signed)
°  Problem: RH Grooming Goal: LTG Patient will perform grooming w/assist,cues/equip (OT) Description: LTG: Patient will perform grooming with assist, with/without cues using equipment (OT) Flowsheets (Taken 03/18/2021 1512) LTG: Pt will perform grooming with assistance level of: Set up assist    Problem: RH Bathing Goal: LTG Patient will bathe all body parts with assist levels (OT) Description: LTG: Patient will bathe all body parts with assist levels (OT) Flowsheets (Taken 03/18/2021 1512) LTG: Pt will perform bathing with assistance level/cueing: Minimal Assistance - Patient > 75%   Problem: RH Dressing Goal: LTG Patient will perform upper body dressing (OT) Description: LTG Patient will perform upper body dressing with assist, with/without cues (OT). Flowsheets (Taken 03/18/2021 1512) LTG: Pt will perform upper body dressing with assistance level of: Supervision/Verbal cueing Goal: LTG Patient will perform lower body dressing w/assist (OT) Description: LTG: Patient will perform lower body dressing with assist, with/without cues in positioning using equipment (OT) Flowsheets (Taken 03/18/2021 1512) LTG: Pt will perform lower body dressing with assistance level of: Minimal Assistance - Patient > 75%   Problem: RH Toileting Goal: LTG Patient will perform toileting task (3/3 steps) with assistance level (OT) Description: LTG: Patient will perform toileting task (3/3 steps) with assistance level (OT)  Flowsheets (Taken 03/18/2021 1512) LTG: Pt will perform toileting task (3/3 steps) with assistance level: Minimal Assistance - Patient > 75%   Problem: RH Toilet Transfers Goal: LTG Patient will perform toilet transfers w/assist (OT) Description: LTG: Patient will perform toilet transfers with assist, with/without cues using equipment (OT) Flowsheets (Taken 03/18/2021 1512) LTG: Pt will perform toilet transfers with assistance level of: Supervision/Verbal cueing   Problem: RH Tub/Shower  Transfers Goal: LTG Patient will perform tub/shower transfers w/assist (OT) Description: LTG: Patient will perform tub/shower transfers with assist, with/without cues using equipment (OT) Flowsheets (Taken 03/18/2021 1512) LTG: Pt will perform tub/shower stall transfers with assistance level of: Supervision/Verbal cueing

## 2021-03-18 NOTE — Evaluation (Signed)
Physical Therapy Assessment and Plan  Patient Details  Name: Joseph Ortega MRN: [id number] Date of Birth: 10-May-1955  PT Diagnosis: Difficulty walking, Hemiplegia non-dominant, and Muscle weakness Rehab Potential: Good ELOS: 3-4 Weeks   Today's Date: 03/18/2021 PT Individual Time: 1101-1200 and 1431-1528 PT Individual Time Calculation (min): 83 min    Hospital Problem: Principal Problem:   ICH (intracerebral hemorrhage) ([place])   Past Medical History:  Past Medical History:  Diagnosis Date   Diabetes mellitus without complication ([place])    Hypertension    Thyroid disease    Past Surgical History:  Past Surgical History:  Procedure Laterality Date   HERNIA REPAIR     IR ANGIO INTRA EXTRACRAN SEL COM CAROTID INNOMINATE BILAT MOD SED  03/09/2021   IR ANGIO VERTEBRAL SEL VERTEBRAL BILAT MOD SED  03/09/2021   IR US GUIDE VASC ACCESS RIGHT  03/09/2021    Assessment & Plan Clinical Impression: Patient is a 65 year old right-handed male who was in his usual state of health until March 08, 2021 when he presented to [facility] emergency department complaining of left arm and leg weakness. Code stroke was initiated.  Neurology was consulted.  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  He was transferred to [facility].  He was noted to have a left anterior chest rash consistent with herpes zoster.  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  He was admitted to the neuro ICU and placed on Cleviprex and Keppra.  Neurosurgery was consulted.  On 12/22 he underwent four-vessel cerebral arteriogram by Dr. Estanislado Pandy.  There was no evidence of DAVF, AV shunting, AVM, aneurysms or of dissection.  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  He was hemodynamically stable. He underwent speech language pathology  evaluation and was started on regular diet.  A repeat CT of the head on 12/25 was stable and aspirin therapy was initiated.  He underwent bilateral lower extremity venous Doppler study on 12/25 negative for DVT.  He underwent CTA of the neck the following day and was unchanged from prior.  Left upper quadrantanopia improved; lower quadrantanopia persisted.  Blood pressure remained controlled on valsartan and metoprolol.  He was continued on his home BiPAP with history of obstructive sleep apnea.  Keppra continues.  Patient will need 30-day event monitor.  Diabetes coordinator was consulted for assistance with blood glucoses and insulin dosing. Patient was overall requiring mod physical assist for bed mobility and +2 for transfer Patient transferred to CIR on 03/17/2021 .   Patient currently requires max with mobility secondary to muscle weakness, decreased cardiorespiratoy endurance, decreased coordination, decreased attention to left, and decreased sitting balance, decreased standing balance, decreased postural control, hemiplegia, and decreased balance strategies.  Prior to hospitalization, patient was independent  with mobility and lived with Spouse in a House home.  Home access is  Ramped entrance.  Patient will benefit from skilled PT intervention to maximize safe functional mobility, minimize fall risk, and decrease caregiver burden for planned discharge home with 24 hour supervision.  Anticipate patient will benefit from follow up [place] at discharge.  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 PT Patient demonstrates impairments in the following area(s):  Balance;Endurance;Motor;Pain;Perception;Safety PT Transfers Functional Problem(s): Bed Mobility;Bed to Chair;Car;Furniture PT Locomotion Functional Problem(s): Ambulation;Wheelchair Mobility;Stairs PT Plan PT Intensity: Minimum of 1-2 x/day ,45 to 90 minutes PT Frequency: 5 out of 7 days PT Treatment/Interventions: Ambulation/gait training;Community reintegration;DME/adaptive equipment instruction;Neuromuscular re-education;Psychosocial support;Stair training;UE/LE Strength taining/ROM;Wheelchair propulsion/positioning;Balance/vestibular training;Discharge planning;Functional electrical stimulation;Pain management;Skin care/wound management;Therapeutic Activities;UE/LE Coordination activities;Cognitive remediation/compensation;Disease management/prevention;Patient/family education;Functional mobility training;Splinting/orthotics;Therapeutic Exercise;Visual/perceptual remediation/compensation PT Transfers Anticipated Outcome(s): Supervision PT Locomotion Anticipated Outcome(s): Supervision PT Recommendation Follow Up Recommendations: Home health PT;24 hour supervision/assistance Patient destination: Home Equipment Recommended: To be determined   PT Evaluation Precautions/Restrictions Precautions Precautions: Fall Precaution Comments: Lt hemi with Lt sided edema Restrictions Weight Bearing Restrictions: No General Chart Reviewed: Yes Family/Caregiver Present: No  Pain Pain Assessment Pain Score: 4  Pain Interference Pain Interference Pain Effect on Sleep: 1. Rarely or not at all Pain Interference with Therapy Activities: 1. Rarely or not at all Pain Interference with Day-to-Day Activities: 1. 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 Per pt, wife can provide light Min A during self care but cannot provide any lifting assistance during transfers  Lives With: Spouse Prior Function Level of Independence: Independent with basic ADLs;Independent with homemaking with ambulation  Able to Take Stairs?: Yes Driving: Yes Vocation: Full time employment Vision/Perception  Vision - History Ability to See in Adequate Light: 0 Adequate Vision - Assessment Eye Alignment: Impaired (comment) (inward posturing) Tracking/Visual Pursuits: Decreased smoothness of vertical tracking;Decreased smoothness of horizontal tracking;Requires cues, head turns, or add eye shifts to track Convergence: Impaired (comment) (unable) Perception Perception: Impaired Inattention/Neglect: Does not attend to left visual field;Other (comment) (Impaired relative to R visual field, but pt reports improving) Praxis Praxis: Intact  Cognition Overall Cognitive Status: Within Functional Limits for tasks assessed Arousal/Alertness: Awake/alert Orientation Level: Oriented X4 Year: 2022 Month: December Day of Week: Correct Immediate Memory Recall: Blue;Sock;Bed Memory Recall Sock: Without Cue Memory Recall Blue: Without Cue Memory Recall Bed: Without Cue Awareness: Appears intact Problem Solving: Appears intact Safety/Judgment: Appears intact Sensation Sensation Light Touch: Appears Intact Coordination Gross Motor Movements are Fluid and Coordinated: No Fine Motor Movements are Fluid and Coordinated: No Coordination and Movement Description: Affected by Lt hemiplegia and Lt sided edema Finger Nose Finger Test: WNL Rt, unable to complete on the Lt Motor  Motor Motor: Hemiplegia  Trunk/Postural Assessment  Cervical Assessment Cervical Assessment: Exceptions to [facility] (forward head) Thoracic Assessment Thoracic Assessment:  Exceptions to [facility] (rounded shoulders, Lt scapular depression and abduction) Lumbar Assessment Lumbar Assessment: Exceptions to [facility] (posterior pelvic tilt) Postural Control Postural Control: Deficits on evaluation (limited during dynamic sitting + dynamic standing tasks)  Balance Balance Balance Assessed: Yes Static Sitting Balance Static Sitting - Balance Support: Feet supported;Bilateral upper extremity supported Static Sitting - Level of Assistance: 5: Stand by assistance Dynamic Sitting Balance Dynamic Sitting - Balance Support: During functional activity;No upper extremity supported;Feet supported Dynamic Sitting - Level of Assistance: 4: Min assist (donning overhead shirt) Sitting balance - Comments: reliant on RUE to assist with balance Static Standing Balance Static Standing - Balance Support: Left upper extremity supported Static Standing - Level of Assistance: 1: +1 Total assist Dynamic  Standing Balance Dynamic Standing - Balance Support: During functional activity Dynamic Standing - Level of Assistance: 2: Max assist Dynamic Standing - Balance Activities: Forward lean/weight shifting;Lateral lean/weight shifting Extremity Assessment  RUE Assessment RUE Assessment: Within Functional Limits Active Range of Motion (AROM) Comments: WNL LUE Assessment LUE Assessment: Exceptions to [facility] LUE Body System: Neuro Brunstrum levels for arm and hand: Arm;Hand Brunstrum level for arm: Stage II Synergy is developing Brunstrum level for hand: Stage I Flaccidity LUE Tone LUE Tone: Hypotonic RLE Assessment RLE Assessment: Within Functional Limits LLE Assessment LLE Assessment: Exceptions to [facility] General Strength Comments: Grossly 2+/5  Care Tool Care Tool Bed Mobility Roll left and right activity   Roll left and right assist level: Maximal Assistance - Patient 25 - 49%    Sit to lying activity   Sit to lying assist level: Maximal Assistance - Patient 25 - 49%    Lying to sitting  on side of bed activity   Lying to sitting on side of bed assist level: the ability to move from lying on the back to sitting on the side of the bed with no back support.: Maximal Assistance - Patient 25 - 49%     Care Tool Transfers Sit to stand transfer   Sit to stand assist level: Total Assistance - Patient < 25%    Chair/bed transfer   Chair/bed transfer assist level: Total Assistance - Patient < 25%     Toilet transfer Toilet transfer activity did not occur: N/A (not assessed due to time constraints) Assist Level: Total Assistance - Patient < 25%    Scientist, product/process development transfer activity did not occur: Safety/medical concerns        Care Tool Locomotion Ambulation Ambulation activity did not occur: Safety/medical concerns        Walk 10 feet activity Walk 10 feet activity did not occur: Safety/medical concerns       Walk 50 feet with 2 turns activity Walk 50 feet with 2 turns activity did not occur: Safety/medical concerns      Walk 150 feet activity Walk 150 feet activity did not occur: Safety/medical concerns      Walk 10 feet on uneven surfaces activity Walk 10 feet on uneven surfaces activity did not occur: Safety/medical concerns      Stairs Stair activity did not occur: Safety/medical concerns        Walk up/down 1 step activity Walk up/down 1 step or curb (drop down) activity did not occur: Safety/medical concerns      Walk up/down 4 steps activity Walk up/down 4 steps activity did not occur: Safety/medical concerns      Walk up/down 12 steps activity Walk up/down 12 steps activity did not occur: Safety/medical concerns      Pick up small objects from floor Pick up small object from the floor (from standing position) activity did not occur: Safety/medical concerns      Wheelchair Is the patient using a wheelchair?: Yes Type of Wheelchair: Manual   Wheelchair assist level: Dependent - Patient 0% Max wheelchair distance: 150'  Wheel 50 feet with 2 turns  activity   Assist Level: Dependent - Patient 0%  Wheel 150 feet activity   Assist Level: Dependent - Patient 0%    Refer to Care Plan for Long Term Goals  SHORT TERM GOAL WEEK 1 PT Short Term Goal 1 (Week 1): Pt will perform be mobility with modA. PT Short Term Goal 2 (Week 1): Pt will perform sit to stand with  maxA +1. PT Short Term Goal 3 (Week 1): Pt will perform bed to chair transfer with modA +1 consistently. PT Short Term Goal 4 (Week 1): Pt will ambulate x25' with modA +2 and LRAD.  Recommendations for other services: None   Skilled Therapeutic Intervention  Evaluation completed (see details above and below) with education on PT POC and goals and individual treatment initiated with focus on bed mobility, balance, transfers, and ambulation. Pt received supine in bed and agrees to therapy. Reports pain in between shoulder blades, 4/10 in severity. PT provides rest breaks as needed to manage pain. Pt performs supine to sit with maxA and cues for logrolling and sequencing. Pt performs sit to stand from elevated EOB with maxA multimodal cueing for hip extension, anterior weight shifting, initiation and sequencing. TotalA for stedy transfer to [facility]. WC transport to gym for time management. PT demonstrates squat pivot transfer to pt and pt completes with modA and cues for anterior trunk lean. Pt takes seated rest break on mat prior to additional mobility. Pt performs sit to stand from slightly elevated mat with totalA, with heavy facilitation of hip and trunk extension, and cueing at L knee to promote extension. Pt did not buckle through L knee but had significant difficulty extending hips sufficiently to maintain standing balance, with posterior bias. During rest break PT demonstrates pt's posture and importance of extending hips and shifting weight forward to have COG over base of support. Pt performs squat pivot to the L hemiplegic side with maxA +1 and +2 for WC stabilization. WC transport back  to room. Pt left seated with alarm intact and all needs within reach.  2nd Session: Pt received seated in [facility] and agrees to therapy. No complaint of pain. WC transport to gym for time management. Pt performs squat pivot transfer to the R with maxA from WC to Nustep, with cues for anterior trunk lean, hand placement, body mechanics, initiation, and sequencing. Pt completes Nustep for reciprocal coordination training and strengthening. Pt performs with only bilateral lower extremities with  green theraband around thighs to provide NM inpute and prevent passive external rotation of L hip. Pt completes x10 minutes on workload of 4 with average steps per minute ~15. Pt performs squat pivot to the L with maxA +1 and +2 to stabilize WC to prevent sliding. Pt performs x2 reps of sit to stand using hall handrails on R for upper extremity support. Pt able to achieve full standing with modA/maxA and remain standing 2-3 minutes with modA and manual facilitation of symmetrical WB between lower extremities, encouraging L lateral weight shift, and promoting hip extension. Mirror used for Warden/ranger. WC transport back to room. Stand pivot transfer back to bed with R hand support on bedrail. Sit to supine with modA management of bilateral lower extremities. Left with alarm intact and all needs within reach.   Mobility Bed Mobility Bed Mobility: Supine to Sit;Sit to Supine Supine to Sit: Maximal Assistance - Patient - Patient 25-49% Sit to Supine: Moderate Assistance - Patient 50-74% Transfers Transfers: Sit to Stand;Stand to Sit;Squat Pivot Transfers;Stand Pivot Transfers Sit to Stand: Total Assistance - Patient < 25% Stand to Sit: Total Assistance - Patient < 25% Stand Pivot Transfers: Maximal Assistance - Patient 25 - 49% Squat Pivot Transfers: Moderate Assistance - Patient 50-74% Transfer via Lift Equipment: Animal nutritionist: No Gait Gait: No Stairs / Additional Locomotion Stairs:  No Wheelchair Mobility Wheelchair Mobility: Yes Wheelchair Assistance: Dependent - Patient 0% Distance: 150'  Discharge Criteria: Patient will be discharged from PT if patient refuses treatment 3 consecutive times without medical reason, if treatment goals not met, if there is a change in medical status, if patient makes no progress towards goals or if patient is discharged from hospital.  The above assessment, treatment plan, treatment alternatives and goals were discussed and mutually agreed upon: by patient  Breck Coons, PT, DPT 03/18/2021, 3:31 PM

## 2021-03-18 NOTE — Progress Notes (Signed)
PROGRESS NOTE   Subjective/Complaints: No complaints this morning Labs reviewed and stable except for elevated glucose and low albumin. On carb modified diet.  No issues reported overnight  ROS: denies pain  Objective:   No results found. Recent Labs    03/18/21 0510  WBC 6.3  HGB 13.6  HCT 40.4  PLT 201   Recent Labs    03/18/21 0510  NA 138  K 4.0  CL 102  CO2 29  GLUCOSE 189*  BUN 16  CREATININE 0.92  CALCIUM 8.9    Intake/Output Summary (Last 24 hours) at 03/18/2021 1322 Last data filed at 03/18/2021 1210 Gross per 24 hour  Intake 483 ml  Output 1125 ml  Net -642 ml        Physical Exam: Vital Signs Blood pressure (!) 150/69, pulse 69, temperature 98 F (36.7 C), resp. rate 18, height 6' 0.99" (1.854 m), weight 129 kg, SpO2 96 %. Gen: no distress, normal appearing HEENT: oral mucosa pink and moist, NCAT Cardio: Reg rate Chest: normal effort, normal rate of breathing Abd: soft, non-distended Ext: no edema Psych: pleasant, normal affect Musculoskeletal:     Right lower leg: Edema present.     Left lower leg: Edema present.     Comments: Unable to lift left arm against gravity.  Can raise left lower extremity approxi-1 inch off mattress.    Skin:    Findings: Rash present.     Comments: Pink, maculopapular rash at inferior skin fold of left breast/anterior chest without signs of infection. Bilateral Lower leg skin darkening consistent with venous stasis.  Approxi-1 cm right lower leg and left lower leg superficial skin ulceration without signs of infection. Foam dressing on each wound  Neurological:     Mental Status: He is alert.     Comments: Alert and oriented x 3. Normal insight and awareness. Intact Memory. Normal language and speech. Cranial nerve exam unremarkable except for LLQ quadrantanopsia. LUE 1+ pec, biceps and 0/5 wrist and hand. LLE 2/5 HE, KE and 1/5 PF, 0/5 DF. RUE and RLE  grossly 5/5. No focal sensory abnl  Psychiatric:        Mood and Affect: Mood normal.        Behavior: Behavior normal.      Assessment/Plan: 1. Functional deficits which require 3+ hours per day of interdisciplinary therapy in a comprehensive inpatient rehab setting. Physiatrist is providing close team supervision and 24 hour management of active medical problems listed below. Physiatrist and rehab team continue to assess barriers to discharge/monitor patient progress toward functional and medical goals  Care Tool:  Bathing    Body parts bathed by patient: Chest, Abdomen, Front perineal area, Right upper leg, Left upper leg, Face   Body parts bathed by helper: Right arm, Left arm, Buttocks, Right lower leg, Left lower leg     Bathing assist Assist Level: Maximal Assistance - Patient 24 - 49%     Upper Body Dressing/Undressing Upper body dressing   What is the patient wearing?: Pull over shirt    Upper body assist Assist Level: Moderate Assistance - Patient 50 - 74%    Lower Body Dressing/Undressing Lower body dressing  What is the patient wearing?: Pants, Underwear/pull up     Lower body assist Assist for lower body dressing: Total Assistance - Patient < 25%     Toileting Toileting Toileting Activity did not occur (Clothing management and hygiene only): N/A (no void or bm)  Toileting assist       Transfers Chair/bed transfer  Transfers assist     Chair/bed transfer assist level: Total Assistance - Patient < 25%     Locomotion Ambulation   Ambulation assist              Walk 10 feet activity   Assist           Walk 50 feet activity   Assist           Walk 150 feet activity   Assist           Walk 10 feet on uneven surface  activity   Assist           Wheelchair     Assist               Wheelchair 50 feet with 2 turns activity    Assist            Wheelchair 150 feet activity      Assist          Blood pressure (!) 150/69, pulse 69, temperature 98 F (36.7 C), resp. rate 18, height 6' 0.99" (1.854 m), weight 129 kg, SpO2 96 %.    Medical Problem List and Plan: 1. 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 ?embolic. 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             -30 day outpatient cardiac event monitor recommended to r/o a fib              -patient may  shower             -ELOS/Goals: 10-14 days, supervision to min assist goals with PT and OT  Initial CIR evaluations today.  2.  Antithrombotics: -DVT/anticoagulation:  Mechanical: Sequential compression devices, below knee Bilateral lower extremities Pharmaceutical: Lovenox             -antiplatelet therapy: aspirin 81 mg 3. Pain Management: Tylenol 4. Mood: LCSW to evaluate and provide emotional support             -antipsychotic agents: n/a 5. Neuropsych: This patient is capable of making decisions on his own behalf. 6. Skin/Wound Care: routine skin checks.  --Monitor BLE ulcerations. 7. Fluids/Electrolytes/Nutrition: Routine Is and Os and follow chemistries 8. Hypertension: continue valsartan and metoprolol 9. 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 Hgb A1c = 11.1             -monitor cbg's, adjust regimen as needed  Carb modified diet.  10: Vitamin D deficiency: started on 1,000mg  daily supplement.  11: Hypothyroidism: continue Synthroid and recheck labs in 6 weeks 12: Peripheral neuropathy: continue gabapentin 300 mg twice daily at home 13: Herpes zoster of chest: continue Valtrex for total of 2-3 weeks 14: OSA: home BiPAP, 2L O2  at night and naps 15: History of depression: Cymbalta listed on home meds 16: Tobacco use: Offer nicotine patch and provide cessation counseling 17: EtOH use: Provide counseling. Magnesium level  reviewed and was 2.2 on 12/21.  18: Obesity BMI 37.53: Dietary consultation 19: Venous insufficiency, bilateral  lower extremities: Has had treatment for venous stasis ulcers including Unna boots and follow-up at [facility] care center.  Currently with small superficial ulcerations bilateral lower legs.  He routinely wears compression stockings. 20. Hypoalbuminemia: educated regarding low sugar/high protein diet.   LOS: 1 days A FACE TO FACE EVALUATION WAS PERFORMED  Lanetra Hartley P Rashonda Warrior 03/18/2021, 1:22 PM

## 2021-03-19 LAB — GLUCOSE, CAPILLARY
Glucose-Capillary: 200 mg/dL — ABNORMAL HIGH (ref 70–99)
Glucose-Capillary: 231 mg/dL — ABNORMAL HIGH (ref 70–99)
Glucose-Capillary: 235 mg/dL — ABNORMAL HIGH (ref 70–99)
Glucose-Capillary: 230 mg/dL — ABNORMAL HIGH (ref 70–99)

## 2021-03-19 MED ORDER — B COMPLEX-C PO TABS
1.0000 | ORAL_TABLET | Freq: Every day | ORAL | Status: DC
  Administered 2021-03-19 – 2021-04-18 (×31): 1 via ORAL
  Filled 2021-03-19 (×31): qty 1

## 2021-03-19 NOTE — Progress Notes (Signed)
Placed patient on BIPAP (dreamstation) previous settings 16/5 with 2L O2 bleed in.

## 2021-03-19 NOTE — Progress Notes (Signed)
PROGRESS NOTE   Subjective/Complaints: Sleepy this AM Left some nutrition sheets at bedside Appreciate respiratory therapist assistance with BIPAP last night No other issues reported overnight  ROS: denies pain  Objective:   No results found. Recent Labs    03/18/21 0510  WBC 6.3  HGB 13.6  HCT 40.4  PLT 201   Recent Labs    03/18/21 0510  NA 138  K 4.0  CL 102  CO2 29  GLUCOSE 189*  BUN 16  CREATININE 0.92  CALCIUM 8.9    Intake/Output Summary (Last 24 hours) at 03/19/2021 1117 Last data filed at 03/19/2021 1021 Gross per 24 hour  Intake 1080 ml  Output 1650 ml  Net -570 ml        Physical Exam: Vital Signs Blood pressure (!) 162/78, pulse 73, temperature 98.1 F (36.7 C), temperature source Oral, resp. rate 18, height 6' 0.99" (1.854 m), weight 129 kg, SpO2 96 %. Gen: no distress, normal appearing HEENT: oral mucosa pink and moist, NCAT Cardio: Reg rate Chest: normal effort, normal rate of breathing Abd: soft, non-distended Ext: no edema Psych: pleasant, normal affect Musculoskeletal:     Right lower leg: Edema present.     Left lower leg: Edema present.     Comments: Unable to lift left arm against gravity.  Can raise left lower extremity approxi-1 inch off mattress.    Skin:    Findings: Rash present.     Comments: Pink, maculopapular rash at inferior skin fold of left breast/anterior chest without signs of infection. Bilateral Lower leg skin darkening consistent with venous stasis.  Approxi-1 cm right lower leg and left lower leg superficial skin ulceration without signs of infection. Foam dressing on each wound  Neurological:     Mental Status: He is alert.     Comments: Alert and oriented x 3. Normal insight and awareness. Intact Memory. Normal language and speech. Cranial nerve exam unremarkable except for LLQ quadrantanopsia. LUE 1+ pec, biceps and 0/5 wrist and hand. LLE 2/5 HE, KE and 1/5  PF, 0/5 DF. RUE and RLE grossly 5/5. No focal sensory abnl  Psychiatric:        Mood and Affect: Mood normal.        Behavior: Behavior normal.      Assessment/Plan: 1. Functional deficits which require 3+ hours per day of interdisciplinary therapy in a comprehensive inpatient rehab setting. Physiatrist is providing close team supervision and 24 hour management of active medical problems listed below. Physiatrist and rehab team continue to assess barriers to discharge/monitor patient progress toward functional and medical goals  Care Tool:  Bathing    Body parts bathed by patient: Chest, Abdomen, Front perineal area, Right upper leg, Left upper leg, Face   Body parts bathed by helper: Right arm, Left arm, Buttocks, Right lower leg, Left lower leg     Bathing assist Assist Level: Maximal Assistance - Patient 24 - 49%     Upper Body Dressing/Undressing Upper body dressing   What is the patient wearing?: Pull over shirt    Upper body assist Assist Level: Moderate Assistance - Patient 50 - 74%    Lower Body Dressing/Undressing Lower body  dressing      What is the patient wearing?: Pants, Underwear/pull up     Lower body assist Assist for lower body dressing: Total Assistance - Patient < 25%     Toileting Toileting Toileting Activity did not occur (Clothing management and hygiene only): N/A (no void or bm)  Toileting assist       Transfers Chair/bed transfer  Transfers assist     Chair/bed transfer assist level: Total Assistance - Patient < 25%     Locomotion Ambulation   Ambulation assist   Ambulation activity did not occur: Safety/medical concerns          Walk 10 feet activity   Assist  Walk 10 feet activity did not occur: Safety/medical concerns        Walk 50 feet activity   Assist Walk 50 feet with 2 turns activity did not occur: Safety/medical concerns         Walk 150 feet activity   Assist Walk 150 feet activity did not  occur: Safety/medical concerns         Walk 10 feet on uneven surface  activity   Assist Walk 10 feet on uneven surfaces activity did not occur: Safety/medical concerns         Wheelchair     Assist Is the patient using a wheelchair?: Yes Type of Wheelchair: Manual    Wheelchair assist level: Dependent - Patient 0% Max wheelchair distance: 150'    Wheelchair 50 feet with 2 turns activity    Assist        Assist Level: Dependent - Patient 0%   Wheelchair 150 feet activity     Assist      Assist Level: Dependent - Patient 0%   Blood pressure (!) 162/78, pulse 73, temperature 98.1 F (36.7 C), temperature source Oral, resp. rate 18, height 6' 0.99" (1.854 m), weight 129 kg, SpO2 96 %.    Medical Problem List and Plan: 1. 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 ?embolic. 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             -30 day outpatient cardiac event monitor recommended to r/o a fib              -patient may  shower             -ELOS/Goals: 10-14 days, supervision to min assist goals with PT and OT  Start vitamin B/C complex to promote recovery 2.  Antithrombotics: -DVT/anticoagulation:  Mechanical: Sequential compression devices, below knee Bilateral lower extremities Pharmaceutical: Lovenox             -antiplatelet therapy: aspirin 81 mg 3. Pain Management: Tylenol 4. Mood: LCSW to evaluate and provide emotional support             -antipsychotic agents: n/a 5. Neuropsych: This patient is capable of making decisions on his own behalf. 6. Skin/Wound Care: routine skin checks.  --Monitor BLE ulcerations. 7. Fluids/Electrolytes/Nutrition: Routine Is and Os and follow chemistries 8. Hypertension: continue valsartan and metoprolol. Provided list of foods that are good for patients with hypertension.   9. 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 Hgb A1c = 11.1             -monitor cbg's, adjust regimen as needed  Carb modified diet.   Provided list of foods that can help with diabetes.  10: Vitamin  D deficiency: started on 1,000mg  daily supplement.  11: Hypothyroidism: continue Synthroid and recheck labs in 6 weeks 12: Peripheral neuropathy: continue gabapentin 300 mg twice daily at home 13: Herpes zoster of chest: continue Valtrex for total of 2-3 weeks 14: OSA: home BiPAP, 2L O2  at night and naps 15: History of depression: Cymbalta listed on home meds 16: Tobacco use: Offer nicotine patch and provide cessation counseling 17: EtOH use: Provide counseling. Magnesium level reviewed and was 2.2 on 12/21.  18: Obesity BMI 37.53: Dietary consultation. Provided list of foods that can help with weight loss.  19: Venous insufficiency, bilateral lower extremities: Has had treatment for venous stasis ulcers including Unna boots and follow-up at [facility] wound care center.  Currently with small superficial ulcerations bilateral lower legs.  He routinely wears compression stockings. 20. Hypoalbuminemia: educated regarding low sugar/high protein diet.   LOS: 2 days A FACE TO FACE EVALUATION WAS PERFORMED  Joseph BolderKrutika P Amanda Steuart 03/19/2021, 11:17 AM

## 2021-03-20 DIAGNOSIS — I609 Nontraumatic subarachnoid hemorrhage, unspecified: Secondary | ICD-10-CM

## 2021-03-20 LAB — GLUCOSE, CAPILLARY
Glucose-Capillary: 183 mg/dL — ABNORMAL HIGH (ref 70–99)
Glucose-Capillary: 202 mg/dL — ABNORMAL HIGH (ref 70–99)
Glucose-Capillary: 230 mg/dL — ABNORMAL HIGH (ref 70–99)
Glucose-Capillary: 203 mg/dL — ABNORMAL HIGH (ref 70–99)

## 2021-03-20 MED ORDER — INSULIN DETEMIR 100 UNIT/ML ~~LOC~~ SOLN
37.0000 [IU] | Freq: Two times a day (BID) | SUBCUTANEOUS | Status: DC
  Administered 2021-03-20 – 2021-03-27 (×14): 37 [IU] via SUBCUTANEOUS
  Filled 2021-03-20 (×15): qty 0.37

## 2021-03-20 NOTE — Progress Notes (Signed)
Physical Therapy Session Note  Patient Details  Name: Joseph Ortega MRN: [id number] Date of Birth: 06-Dec-1955  Today's Date: 03/20/2021 PT Individual Time: [id number] PT Individual Time Calculation (min): 55 min   Short Term Goals: Week 1:  PT Short Term Goal 1 (Week 1): Pt will perform be mobility with modA. PT Short Term Goal 2 (Week 1): Pt will perform sit to stand with maxA +1. PT Short Term Goal 3 (Week 1): Pt will perform bed to chair transfer with modA +1 consistently. PT Short Term Goal 4 (Week 1): Pt will ambulate x25' with modA +2 and LRAD.  Skilled Therapeutic Interventions/Progress Updates:     Pt received seated in [facility] and agrees to therapy. No complaint of pain. WC transport to gym for time management. Pt performs NMR in parallel bars for L hemibody WB, balance, and transfer training. Mirror for visual feedback. Pt perform sit to stand with modA and cues for anterior weight shifting and body mechanics.Pt able to achieve full standing posture with PT providing modA at L hip to promote extension, with multimodal cues for glute engagement, neutral posture, and shifting weight into L lower extremity. Pt has tendency to put majority of weight through R leg. PT provides cues to lift R leg up and pt is able to do so, but requires maxA to complete due to posterior bias and insufficient hip extension. After 2 bouts in standing, PT places 3 inch platform to prop R leg on in standing to promote L sided WB. Pt requires maxA to stand with R foot on 3 inch block, but is able to achieve full standing and maintain ~1-2 minutes with modA at hips to promote extension. Pt has consistent posterior bias and tendency to push to the L with R leg. On subsequent bout of standing, platform removed and pt performs targeted stepping with R foot to promote lateral weight shifting. Pt able to complete with modA to promote L lateral weight shifting, and improved engagement of hip extensors. WC transport back to room. Pt  left seated with alarm intact and all needs within reach.  Therapy Documentation Precautions:  Precautions Precautions: Fall Precaution Comments: Lt hemi with Lt sided edema Restrictions Weight Bearing Restrictions: No    Therapy/Group: Individual Therapy  Breck Coons, PT, DPT 03/20/2021, 12:58 PM

## 2021-03-20 NOTE — IPOC Note (Signed)
Overall Plan of Care [facility]) Patient Details Name: Joseph Ortega MRN: [id number] DOB: 02-18-1956  Admitting Diagnosis: ICH (intracerebral hemorrhage) [facility])  Hospital Problems: Principal Problem:   ICH (intracerebral hemorrhage) ([place])     Functional Problem List: Nursing Endurance, Edema, Medication Management, Pain, Safety, Skin Integrity  PT Balance, Endurance, Motor, Pain, Perception, Safety  OT Balance, Edema, Endurance, Motor, Pain, Safety  SLP    TR         Basic ADLs: OT Grooming, Bathing, Dressing, Toileting     Advanced  ADLs: OT Simple Meal Preparation     Transfers: PT Bed Mobility, Bed to Chair, Car, Manufacturing systems engineer, Metallurgist: PT Ambulation, Emergency planning/management officer, Stairs     Additional Impairments: OT Fuctional Use of Upper Extremity  SLP        TR      Anticipated Outcomes Item Anticipated Outcome  Self Feeding No goal  Swallowing      Basic self-care  Supervision-Min A  Toileting  Min A   Bathroom Transfers Supervision  Bowel/Bladder  n/a  Transfers  Supervision  Locomotion  Supervision  Communication     Cognition     Pain  < 3  Safety/Judgment  supervision and no falls   Therapy Plan: PT Intensity: Minimum of 1-2 x/day ,45 to 90 minutes PT Frequency: 5 out of 7 days PT Duration Estimated Length of Stay: 3-4 Weeks OT Intensity: Minimum of 1-2 x/day, 45 to 90 minutes OT Frequency: 5 out of 7 days OT Duration/Estimated Length of Stay: 3-4 weeks     Due to the current state of emergency, patients may not be receiving their 3-hours of Medicare-mandated therapy.   Team Interventions: Nursing Interventions Patient/Family Education, Disease Management/Prevention, Pain Management, Medication Management, Skin Care/Wound Management, Discharge Planning  PT interventions Ambulation/gait training, Community reintegration, DME/adaptive equipment instruction, Neuromuscular re-education, Psychosocial support, Stair  training, UE/LE Strength taining/ROM, Wheelchair propulsion/positioning, Training and development officer, Discharge planning, Functional electrical stimulation, Pain management, Skin care/wound management, Therapeutic Activities, UE/LE Coordination activities, Cognitive remediation/compensation, Disease management/prevention, Patient/family education, Functional mobility training, Splinting/orthotics, Therapeutic Exercise, Visual/perceptual remediation/compensation  OT Interventions Disease mangement/prevention, Training and development officer, Neuromuscular re-education, Self Care/advanced ADL retraining, Therapeutic Exercise, Wheelchair propulsion/positioning, UE/LE Strength taining/ROM, Pain management, DME/adaptive equipment instruction, Community reintegration, Technical sales engineer stimulation, Patient/family education, Splinting/orthotics, UE/LE Coordination activities, Therapeutic Activities, Psychosocial support, Functional mobility training, Discharge planning  SLP Interventions    TR Interventions    SW/CM Interventions Discharge Planning, Psychosocial Support, Patient/Family Education   Barriers to Discharge MD  Medical stability  Nursing Decreased caregiver support, Wound Care, Lack of/limited family support, Weight, Medication compliance Lives in 1 level house with ramped entrance. Lives with spoouse who plans to hire caregivers to assist at discharge.  PT Home environment access/layout, Lack of/limited family support Pt is primary caregiver for wife  OT Decreased caregiver support, Lack of/limited family support    SLP      SW       Team Discharge Planning: Destination: PT-Home ,OT- Home , SLP-  Projected Follow-up: PT-Home health PT, 24 hour supervision/assistance, OT-  Home health OT, SLP-  Projected Equipment Needs: PT-To be determined, OT- To be determined, SLP-  Equipment Details: PT- , OT-  Patient/family involved in discharge planning: PT- Patient,  OT-Patient, SLP-   MD ELOS:  21-24d Medical Rehab Prognosis:  Good Assessment: 66 year old right-handed male who was in his usual state of health until March 08, 2021 when he presented to [facility] emergency department complaining  of left arm and leg weakness.  Code stroke was initiated.  Neurology was consulted.  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  He was transferred to [facility].  He was noted to have a left anterior chest rash consistent with herpes zoster.  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  He was admitted to the neuro ICU and placed on Cleviprex and Keppra.  Neurosurgery was consulted.  On 12/22 he underwent four-vessel cerebral arteriogram by Dr. Estanislado Pandy.  There was no evidence of DAVF, AV shunting, AVM, aneurysms or of dissection.  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  He was hemodynamically stable. He underwent speech language pathology evaluation and was started on regular diet.  A repeat CT of the head on 12/25 was stable and aspirin therapy was initiated.  He underwent bilateral lower extremity venous Doppler study on 12/25 negative for DVT.  He underwent CTA of the neck the following day and was unchanged from prior.  Left upper quadrantanopia improved; lower quadrantanopia persisted.  Blood pressure remained controlled on valsartan and metoprolol.  He was continued on his home BiPAP with history of obstructive sleep apnea.  Keppra continues.  Patient will need 30-day event monitor.  Diabetes coordinator was consulted for assistance with blood glucoses and insulin dosing. Patient was overall requiring mod physical assist for bed mobility and +2 for transfer. The patient requires inpatient physical medicine and rehabilitation evaluations and treatment secondary to dysfunction due to hemispheric subarachnoid  hemorrhage, right occipital lobe infarction and enlargement of posterior frontal infarction.      See Team Conference Notes for weekly updates to the plan of care

## 2021-03-20 NOTE — Progress Notes (Signed)
Occupational Therapy Session Note  Patient Details  Name: Joseph Ortega MRN: [id number] Date of Birth: 05-14-1955  Today's Date: 03/20/2021 Session 1 OT Individual Time: 0929-5747 OT Individual Time Calculation (min): 70 min   Session 2 OT Individual Time: 1340-1440 OT Individual Time Calculation (min): 60 min    Short Term Goals: Week 1:  OT Short Term Goal 1 (Week 1): Pt will complete BSC transfer with +2 assist to progress with OOB toileting OT Short Term Goal 2 (Week 1): Pt will complete sit<stand in [place] with no more than Mod A during 2 consecutive ADL sessions OT Short Term Goal 3 (Week 1): Pt will assist with 1/3 components of toileting with no more than Mod balance assist  Skilled Therapeutic Interventions/Progress Updates:  Session 1   Pt greeted semi-reclined in bed and agreeable to OT treatment session. Pt reported need to go to the bathroom. Pt had already been incontinent of urine in bed. Pt needed max A for bed mobility due to L hemiplegia. Sit<>stand in [place] from raised bed with max A to power up. Stedy transfer to bariatric BSC. Pt with successful BM and voided bladder. Total A for peri-care. LB bathing completed from [facility] with max A of 1. Incorporated L UE NMR with pushing wash cloth across L thigh. Pt with good shoulder and scapula activation! Total A to thread pants, then sit<>stand from [facility] with max A and OT assist to pull up pants. Stedy transfer over to wc. Worked on UB bathing/dressing with OT assist to integrate L UE for neuro re-ed. Blocked practice for hemi UB dressing strategies. Pt demonstrated understanding after 3 trials. Educated on use of L UE as a stabilizer to hold toothbrush while applying toothpaste. Pt left seated in wc at end of session with alarm belt on, call bell in reach, and needs met.   Denies pain  Session 2 Pt greeted seated in wc and agreeable to OT treatment session. Pt brought down to therapy gym in wc for time management. OT set-up SAEBO  e-stim and educated on purpose. E-stim use to facilitate wrist extension. OT had pt simultaneously extend R wrist in unison for neuro re-ed. OT placed kinesiotape on L hand for edema management. Sit<>stand on high-low table with max A and L knee block. OT facilitated weight bearing towel pushes with L UE. Max A to maintain standing. Pt returned to room and Stedy used to transfer back to bed with max A to power up. Max A to lift L LE back into bed. Pt left semi-reclined in bed with bed alarm on, call bell in reach, and needs met.   Therapy Documentation Precautions:  Precautions Precautions: Fall Precaution Comments: Lt hemi with Lt sided edema Restrictions Weight Bearing Restrictions: No  Pain:  Denies pain   Therapy/Group: Individual Therapy  Valma Cava 03/20/2021, 2:52 PM

## 2021-03-20 NOTE — Progress Notes (Signed)
Occupational Therapy Session Note  Patient Details  Name: Joseph Ortega MRN: [id number] Date of Birth: 1955/11/16  Today's Date: 03/20/2021 OT Individual Time: 0930-1010 OT Individual Time Calculation (min): 40 min    Short Term Goals: Week 1:  OT Short Term Goal 1 (Week 1): Pt will complete BSC transfer with +2 assist to progress with OOB toileting OT Short Term Goal 2 (Week 1): Pt will complete sit<stand in [place] with no more than Mod A during 2 consecutive ADL sessions OT Short Term Goal 3 (Week 1): Pt will assist with 1/3 components of toileting with no more than Mod balance assist  Skilled Therapeutic Interventions/Progress Updates:    Pt resting in w/c upon arrival. OT intervention with focus on LUE AAROM/AROM. Facilitation provided for scapula elevation/depression with limited response. Elbow flexion/extension noted in addition to shoulder flexion/extension. Focus on muscle isolation and sitting posture when engaging LUE. Pt remained in w/c with belt alarm activated and all needs within reach.   Therapy Documentation Precautions:  Precautions Precautions: Fall Precaution Comments: Lt hemi with Lt sided edema Restrictions Weight Bearing Restrictions: No  Pain:  Pt denies pain this morning  Therapy/Group: Individual Therapy  Rich Brave 03/20/2021, 10:14 AM

## 2021-03-20 NOTE — Progress Notes (Signed)
Inpatient Rehabilitation Care Coordinator Assessment and Plan Patient Details  Name: Joseph Ortega MRN: [id number] Date of Birth: 1955-10-29  Today's Date: 03/20/2021  Hospital Problems: Principal Problem:   ICH (intracerebral hemorrhage) ([place])  Past Medical History:  Past Medical History:  Diagnosis Date   Diabetes mellitus without complication ([place])    Hypertension    Thyroid disease    Past Surgical History:  Past Surgical History:  Procedure Laterality Date   HERNIA REPAIR     IR ANGIO INTRA EXTRACRAN SEL COM CAROTID INNOMINATE BILAT MOD SED  03/09/2021   IR ANGIO VERTEBRAL SEL VERTEBRAL BILAT MOD SED  03/09/2021   IR US GUIDE VASC ACCESS RIGHT  03/09/2021   Social History:  reports that he has never smoked. He has never used smokeless tobacco. He reports that he does not drink alcohol and does not use drugs.  Family / Support Systems Marital Status: Married How Long?: 65 years Patient Roles: Spouse, Parent Spouse/Significant Other: Jackelyn Poling (wife): [phone] Children: 3 adult children. Other Supports: church family/friends Anticipated Caregiver: Pt wife Ability/Limitations of Caregiver: Pt reports his wife uses a walker and wheelchair due to physical health problems Caregiver Availability: 24/7 Family Dynamics: Pt lives with his wife.  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 Education: some college; certifications related to employment Health Literacy - How often do you need to have someone help you when you read instructions, pamphlets, or other written material from your doctor or pharmacy?: Never Writes: Yes Employment Status: Employed Name of Employer: [employer] Length of Employment: 4.5 (years) Return to Work Plans: TBD. Pt reports STD begins at the end of this week in which he has 16 wks and then will transition into LTD. Legal History/Current Legal Issues: Pt  admits to two days in jail due to a domestic issue. Guardian/Conservator: N/A   Abuse/Neglect Abuse/Neglect Assessment Can Be Completed: Yes Physical Abuse: Denies Verbal Abuse: Denies Sexual Abuse: Denies Exploitation of patient/patient's resources: Denies Self-Neglect: Denies  Patient response to: Social Isolation - How often do you feel lonely or isolated from those around you?: Never  Emotional Status Pt's affect, behavior and adjustment status: Pt in good spirits at time of visit. Recent Psychosocial Issues: Pt admits to depression Psychiatric History: Pt sees Dr. Parke Simmers for medication management. Substance Abuse History: Pt admits to drinking beer on occassion.  Patient / Family Perceptions, Expectations & Goals Pt/Family understanding of illness & functional limitations: Pt has a general understanding of his care needs Premorbid pt/family roles/activities: Independent Anticipated changes in roles/activities/participation: Assistance with ADLs/IADLa Pt/family expectations/goals: Pt goal is to "get better so I can help my wife."  [employer]: None Premorbid Home Care/DME Agencies: None Transportation available at discharge: TBD Is the patient able to respond to transportation needs?: Yes In the past 12 months, has lack of transportation kept you from medical appointments or from getting medications?: No In the past 12 months, has lack of transportation kept you from meetings, work, or from getting things needed for daily living?: No Resource referrals recommended: Neuropsychology  Discharge Planning Living Arrangements: Spouse/significant other Support Systems: Spouse/significant other, Friends/neighbors Type of Residence: Private residence Insurance underwriter Resources: Multimedia programmer (specify) Nurse, mental health) Financial Resources: Employment, Other (Comment) (STD through employer) Financial Screen Referred: No Living Expenses: Medical laboratory scientific officer Management:  Patient Does the patient have any problems obtaining your medications?: No Home Management: Pt reports his wife manages all meal prep and home cleaning. Patient/Family  Preliminary Plans: TBD Care Coordinator Barriers to Discharge: Decreased caregiver support, Insurance for SNF coverage, Lack of/limited family support Care Coordinator Anticipated Follow Up Needs: HH/OP Expected length of stay: 3-4 weeks  Clinical Impression SW met with pt in room to introduce self, explain role, and discuss discharge process. Pt is not a English as a second language teacher. No HCPOA. No DME. Pt aware SW to follow-up with his wife.   Dreshon Proffit A Victora Irby 03/20/2021, 10:14 PM

## 2021-03-20 NOTE — Progress Notes (Signed)
PROGRESS NOTE   Subjective/Complaints:  No issues overnite , discussed vision issues which pt feels are improving    ROS: denies pain  Objective:   No results found. Recent Labs    03/18/21 0510  WBC 6.3  HGB 13.6  HCT 40.4  PLT 201    Recent Labs    03/18/21 0510  NA 138  K 4.0  CL 102  CO2 29  GLUCOSE 189*  BUN 16  CREATININE 0.92  CALCIUM 8.9     Intake/Output Summary (Last 24 hours) at 03/20/2021 0844 Last data filed at 03/20/2021 0810 Gross per 24 hour  Intake 480 ml  Output 800 ml  Net -320 ml         Physical Exam: Vital Signs Blood pressure (!) 169/89, pulse 75, temperature 98.1 F (36.7 C), resp. rate 18, height 6' 0.99" (1.854 m), weight 129 kg, SpO2 98 %.  General: No acute distress Mood and affect are appropriate Heart: Regular rate and rhythm no rubs murmurs or extra sounds Lungs: Clear to auscultation, breathing unlabored, no rales or wheezes Abdomen: Positive bowel sounds, soft nontender to palpation, nondistended Extremities: No clubbing, cyanosis, or edema Skin: No evidence of breakdown, no evidence of rash   Musculoskeletal:     Right lower leg: Edema present.     Left lower leg: Edema present.     Comments: Unable to lift left arm against gravity.  Can raise left lower extremity approxi-1 inch off foot rest     Neurological:     Mental Status: He is alert.     Comments: Alert and oriented x 3. Normal insight and awareness. Intact Memory. Normal language and speech. Cranial nerve exam unremarkable except for LLQ quadrantanopsia. LUE 1+ pec, biceps and 0/5 wrist and hand. LLE 2/5 HE, KE and 1/5 PF, 0/5 DF. RUE and RLE grossly 5/5. No focal sensory abnl  Sensation intact to LT BUE and BLE  Psychiatric:        Mood and Affect: Mood normal.        Behavior: Behavior normal.      Assessment/Plan: 1. Functional deficits which require 3+ hours per day of interdisciplinary  therapy in a comprehensive inpatient rehab setting. Physiatrist is providing close team supervision and 24 hour management of active medical problems listed below. Physiatrist and rehab team continue to assess barriers to discharge/monitor patient progress toward functional and medical goals  Care Tool:  Bathing    Body parts bathed by patient: Chest, Abdomen, Front perineal area, Right upper leg, Left upper leg, Face   Body parts bathed by helper: Right arm, Left arm, Buttocks, Right lower leg, Left lower leg     Bathing assist Assist Level: Maximal Assistance - Patient 24 - 49%     Upper Body Dressing/Undressing Upper body dressing   What is the patient wearing?: Pull over shirt    Upper body assist Assist Level: Moderate Assistance - Patient 50 - 74%    Lower Body Dressing/Undressing Lower body dressing      What is the patient wearing?: Pants, Underwear/pull up     Lower body assist Assist for lower body dressing: Total Assistance - Patient <  25%     Toileting Toileting Toileting Activity did not occur (Acupuncturist and hygiene only): N/A (no void or bm)  Toileting assist Assist for toileting: Moderate Assistance - Patient 50 - 74%     Transfers Chair/bed transfer  Transfers assist     Chair/bed transfer assist level: Total Assistance - Patient < 25%     Locomotion Ambulation   Ambulation assist   Ambulation activity did not occur: Safety/medical concerns          Walk 10 feet activity   Assist  Walk 10 feet activity did not occur: Safety/medical concerns        Walk 50 feet activity   Assist Walk 50 feet with 2 turns activity did not occur: Safety/medical concerns         Walk 150 feet activity   Assist Walk 150 feet activity did not occur: Safety/medical concerns         Walk 10 feet on uneven surface  activity   Assist Walk 10 feet on uneven surfaces activity did not occur: Safety/medical concerns          Wheelchair     Assist Is the patient using a wheelchair?: Yes Type of Wheelchair: Manual    Wheelchair assist level: Dependent - Patient 0% Max wheelchair distance: 150'    Wheelchair 50 feet with 2 turns activity    Assist        Assist Level: Dependent - Patient 0%   Wheelchair 150 feet activity     Assist      Assist Level: Dependent - Patient 0%   Blood pressure (!) 169/89, pulse 75, temperature 98.1 F (36.7 C), resp. rate 18, height 6' 0.99" (1.854 m), weight 129 kg, SpO2 98 %.    Medical Problem List and Plan: 1. 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 ?embolic. 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             -30 day outpatient cardiac event monitor recommended to r/o a fib              -patient may  shower             -ELOS/Goals: 10-14 days, supervision to min assist goals with PT and OT  Start vitamin B/C complex to promote recovery 2.  Antithrombotics: -DVT/anticoagulation:  Mechanical: Sequential compression devices, below knee Bilateral lower extremities Pharmaceutical: Lovenox             -antiplatelet therapy: aspirin 81 mg 3. Pain Management: Tylenol 4. Mood: LCSW to evaluate and provide emotional support             -antipsychotic agents: n/a 5. Neuropsych: This patient is capable of making decisions on his own behalf. 6. Skin/Wound Care: routine skin checks.  --Monitor BLE ulcerations. 7. Fluids/Electrolytes/Nutrition: Routine Is and Os and follow chemistries 8. Hypertension: continue valsartan and metoprolol. Provided list of foods that are good for patients with hypertension.  9. 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 Hgb A1c = 11.1             -monitor cbg's, adjust regimen as needed  Carb modified diet.   CBG (last 3)  Recent Labs    03/19/21 1646  03/19/21 2013 03/20/21 0558  GLUCAP 235* 230* 183*  Will increase levimir  10: Vitamin D deficiency: started on 1,000mg  daily supplement.  11: Hypothyroidism: continue  Synthroid and recheck labs in 6 weeks 12: Peripheral neuropathy: continue gabapentin 300 mg twice daily at home 13: Herpes zoster of chest: continue Valtrex for total of 2-3 weeks 14: OSA: home BiPAP, 2L O2  at night and naps 15: History of depression: Cymbalta listed on home meds 16: Tobacco use: Offer nicotine patch and provide cessation counseling 17: EtOH use: Provide counseling. Magnesium level reviewed and was 2.2 on 12/21.  18: Obesity BMI 37.53: Dietary consultation. Provided list of foods that can help with weight loss.  19: Venous insufficiency, bilateral lower extremities: Has had treatment for venous stasis ulcers including Unna boots and follow-up at [facility] wound care center.  Currently with small superficial ulcerations bilateral lower legs.  He routinely wears compression stockings. 20. Hypoalbuminemia: educated regarding low sugar/high protein diet.  21.  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

## 2021-03-20 NOTE — Care Management (Signed)
Inpatient Rehabilitation Center Individual Statement of Services  Patient Name:  Zykeem Bauserman  Date:  03/20/2021  Welcome to the Inpatient Rehabilitation Center.  Our goal is to provide you with an individualized program based on your diagnosis and situation, designed to meet your specific needs.  With this comprehensive rehabilitation program, you will be expected to participate in at least 3 hours of rehabilitation therapies Monday-Friday, with modified therapy programming on the weekends.  Your rehabilitation program will include the following services:  Physical Therapy (PT), Occupational Therapy (OT), 24 hour per day rehabilitation nursing, Therapeutic Recreaction (TR), Psychology, Neuropsychology, Care Coordinator, Rehabilitation Medicine, Nutrition Services, Pharmacy Services, and Other  Weekly team conferences will be held on Tuesdays to discuss your progress.  Your Inpatient Rehabilitation Care Coordinator will talk with you frequently to get your input and to update you on team discussions.  Team conferences with you and your family in attendance may also be held.  Expected length of stay: 3-4 weeks    Overall anticipated outcome: Supervision  Depending on your progress and recovery, your program may change. Your Inpatient Rehabilitation Care Coordinator will coordinate services and will keep you informed of any changes. Your Inpatient Rehabilitation Care Coordinator's name and contact numbers are listed  below.  The following services may also be recommended but are not provided by the Inpatient Rehabilitation Center:  Driving Evaluations Home Health Rehabiltiation Services Outpatient Rehabilitation Services Vocational Rehabilitation   Arrangements will be made to provide these services after discharge if needed.  Arrangements include referral to agencies that provide these services.  Your insurance has been verified to be:  BCBS  Your primary doctor is:  Dennis Bast  Pertinent  information will be shared with your doctor and your insurance company.  Inpatient Rehabilitation Care Coordinator:  Susie Cassette [phone] or [phone]  Information discussed with and copy given to patient by: Gretchen Short, 03/20/2021, 9:32 AM

## 2021-03-20 NOTE — Progress Notes (Signed)
Inpatient Rehabilitation  Patient information reviewed and entered into eRehab system by Dimond Crotty M. Keven Soucy, M.A., CCC/SLP, PPS Coordinator.  Information including medical coding, functional ability and quality indicators will be reviewed and updated through discharge.    

## 2021-03-21 LAB — GLUCOSE, CAPILLARY
Glucose-Capillary: 189 mg/dL — ABNORMAL HIGH (ref 70–99)
Glucose-Capillary: 201 mg/dL — ABNORMAL HIGH (ref 70–99)
Glucose-Capillary: 137 mg/dL — ABNORMAL HIGH (ref 70–99)
Glucose-Capillary: 95 mg/dL (ref 70–99)
Glucose-Capillary: 176 mg/dL — ABNORMAL HIGH (ref 70–99)
Glucose-Capillary: 192 mg/dL — ABNORMAL HIGH (ref 70–99)
Glucose-Capillary: 169 mg/dL — ABNORMAL HIGH (ref 70–99)

## 2021-03-21 NOTE — Progress Notes (Signed)
Physical Therapy Session Note  Patient Details  Name: Joseph Ortega MRN: [id number] Date of Birth: December 08, 1955  Today's Date: 03/21/2021 PT Individual Time: 0915-1026 PT Individual Time Calculation (min): 71 min   Short Term Goals: Week 1:  PT Short Term Goal 1 (Week 1): Pt will perform be mobility with modA. PT Short Term Goal 2 (Week 1): Pt will perform sit to stand with maxA +1. PT Short Term Goal 3 (Week 1): Pt will perform bed to chair transfer with modA +1 consistently. PT Short Term Goal 4 (Week 1): Pt will ambulate x25' with modA +2 and LRAD.  Skilled Therapeutic Interventions/Progress Updates:    Pt seated in w/c on arrival and agreeable to therapy. No c/o pain at rest, 3/10 with activity. Pt opted to request pain meds at end of session. Pt transported to therapy gym for time management and energy conservation.   Pt directed in NMR in // bars for progression toward gait. Pre gait weight shifting progressing to lifting R foot for forced use of LLE. VC and tactile cues for hip extension and neutral posture. On first bout, pt able to achieve SLS x 3 sec x 1. Second and third bouts, pt able to lift x 4. Pt able to maintain single leg stance as long as 10 sec but averaging ~6 sec each time. Provided education on weight bearing and facilitation techniques during rest breaks. Pt then directed in static standing while lifting paretic arm, over bar x 3 bouts. Pt responded well to tapping facilitation at bicep. Discussed synergy pattern and required assist to break pattern lifting arm into external rotation.   Transitioned to seated exercise d/t fatigue. Pt directed in lifting LLE over 4" obstacle, first using knee flexion (marching) then knee extension (mod SLR), 3 x 4 each. Tapping facilitation at ham string and quad respectively.   Pt transported back to room and remained in w/c with all needs in reach.   Therapy Documentation Precautions:  Precautions Precautions: Fall Precaution Comments:  Lt hemi with Lt sided edema Restrictions Weight Bearing Restrictions: No    Therapy/Group: Individual Therapy  Juluis Rainier 03/21/2021, 10:05 AM

## 2021-03-21 NOTE — Progress Notes (Signed)
PROGRESS NOTE   Subjective/Complaints: No issues overnite or this am , worked hard in therapy yesterday , discussed care team and diabetic management with pt   ROS: denies pain, no SOB, - N/V/D  Objective:   No results found. No results for input(s): WBC, HGB, HCT, PLT in the last 72 hours.  No results for input(s): NA, K, CL, CO2, GLUCOSE, BUN, CREATININE, CALCIUM in the last 72 hours.   Intake/Output Summary (Last 24 hours) at 03/21/2021 0753 Last data filed at 03/21/2021 0525 Gross per 24 hour  Intake 717 ml  Output 1100 ml  Net -383 ml         Physical Exam: Vital Signs Blood pressure (!) 162/78, pulse 70, temperature 98.5 F (36.9 C), resp. rate 16, height 6' 0.99" (1.854 m), weight 129 kg, SpO2 95 %.  General: No acute distress Mood and affect are appropriate Heart: Regular rate and rhythm no rubs murmurs or extra sounds Lungs: Clear to auscultation, breathing unlabored, no rales or wheezes Abdomen: Positive bowel sounds, soft nontender to palpation, nondistended Extremities: No clubbing, cyanosis, or edema Skin: No evidence of breakdown, no evidence of rash  Musculoskeletal:     Right lower leg: Edema present.     Left lower leg: Edema present.     Comments: Unable to lift left arm against gravity.  Can raise left lower extremity approxi-1 inch off foot rest     Neurological:     Mental Status: He is alert.     Comments: Alert and oriented x 3. Normal insight and awareness. Intact Memory. Normal language and speech. Cranial nerve exam unremarkable except for LLQ quadrantanopsia. LUE 1+ pec, biceps and 0/5 wrist and hand. LLE 2/5 HE, KE and 1/5 PF, 0/5 DF. RUE and RLE grossly 5/5. No focal sensory abnl  Sensation intact to LT BUE and BLE  Psychiatric:        Mood and Affect: Mood normal.        Behavior: Behavior normal.      Assessment/Plan: 1. Functional deficits which require 3+ hours per day of  interdisciplinary therapy in a comprehensive inpatient rehab setting. Physiatrist is providing close team supervision and 24 hour management of active medical problems listed below. Physiatrist and rehab team continue to assess barriers to discharge/monitor patient progress toward functional and medical goals  Care Tool:  Bathing    Body parts bathed by patient: Chest, Abdomen, Front perineal area, Right upper leg, Left upper leg, Face   Body parts bathed by helper: Right arm, Left arm, Buttocks, Right lower leg, Left lower leg     Bathing assist Assist Level: Maximal Assistance - Patient 24 - 49%     Upper Body Dressing/Undressing Upper body dressing   What is the patient wearing?: Pull over shirt    Upper body assist Assist Level: Moderate Assistance - Patient 50 - 74%    Lower Body Dressing/Undressing Lower body dressing      What is the patient wearing?: Pants, Underwear/pull up     Lower body assist Assist for lower body dressing: Total Assistance - Patient < 25%     Toileting Toileting Toileting Activity did not occur Press photographer  and hygiene only): N/A (no void or bm)  Toileting assist Assist for toileting: Moderate Assistance - Patient 50 - 74%     Transfers Chair/bed transfer  Transfers assist     Chair/bed transfer assist level: Total Assistance - Patient < 25%     Locomotion Ambulation   Ambulation assist   Ambulation activity did not occur: Safety/medical concerns          Walk 10 feet activity   Assist  Walk 10 feet activity did not occur: Safety/medical concerns        Walk 50 feet activity   Assist Walk 50 feet with 2 turns activity did not occur: Safety/medical concerns         Walk 150 feet activity   Assist Walk 150 feet activity did not occur: Safety/medical concerns         Walk 10 feet on uneven surface  activity   Assist Walk 10 feet on uneven surfaces activity did not occur: Safety/medical  concerns         Wheelchair     Assist Is the patient using a wheelchair?: Yes Type of Wheelchair: Manual    Wheelchair assist level: Dependent - Patient 0% Max wheelchair distance: 150'    Wheelchair 50 feet with 2 turns activity    Assist        Assist Level: Dependent - Patient 0%   Wheelchair 150 feet activity     Assist      Assist Level: Dependent - Patient 0%   Blood pressure (!) 162/78, pulse 70, temperature 98.5 F (36.9 C), resp. rate 16, height 6' 0.99" (1.854 m), weight 129 kg, SpO2 95 %.    Medical Problem List and Plan: 1. 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 ?embolic. 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             -30 day outpatient cardiac event monitor recommended to r/o a fib              -patient may  shower             -ELOS/Goals: 10-14 days, supervision to min assist goals with PT and OT, team conf today see conf note   2.  Antithrombotics: -DVT/anticoagulation:  Mechanical: Sequential compression devices, below knee Bilateral lower extremities Pharmaceutical: Lovenox             -antiplatelet therapy: aspirin 81 mg 3. Pain Management: Tylenol 4. Mood: LCSW to evaluate and provide emotional support             -antipsychotic agents: n/a 5. Neuropsych: This patient is capable of making decisions on his own behalf. 6. Skin/Wound Care: routine skin checks.  --Monitor BLE ulcerations. 7. Fluids/Electrolytes/Nutrition: Routine Is and Os and follow chemistries 8. Hypertension: continue valsartan and metoprolol. Provided list of foods that are good for patients with hypertension.  9. 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 Hgb A1c = 11.1             -monitor cbg's, adjust regimen as needed  Carb modified diet.   CBG (last 3)  Recent Labs    03/20/21 2051  03/21/21 0619 03/21/21 0751  GLUCAP 203* 189* 201*   Will increase levimir to 37U BID , re eval dose in am   10: Vitamin D deficiency: started on 1,000mg  daily supplement.  11: Hypothyroidism: continue Synthroid and recheck  labs in 6 weeks 12: Peripheral neuropathy: continue gabapentin 300 mg twice daily at home 13: Herpes zoster of chest: continue Valtrex for total of 2-3 weeks 14: OSA: home BiPAP, 2L O2  at night and naps 15: History of depression: Cymbalta listed on home meds 16: Tobacco use: Offer nicotine patch and provide cessation counseling 17: EtOH use: Provide counseling. Magnesium level reviewed and was 2.2 on 12/21.  18: Obesity BMI 37.53: Dietary consultation. Provided list of foods that can help with weight loss.  19: Venous insufficiency, bilateral lower extremities: Has had treatment for venous stasis ulcers including Unna boots [facility]  Currently with small superficial ulcerations bilateral lower legs.  He routinely wears compression stockings. 20. Hypoalbuminemia: educated regarding low sugar/high protein diet.  21.  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

## 2021-03-21 NOTE — Progress Notes (Signed)
Physical Therapy Session Note  Patient Details  Name: Joseph Ortega MRN: [id number] Date of Birth: 01/09/1956  Today's Date: 03/21/2021 PT Individual Time: 1302-1359 PT Individual Time Calculation (min): 57 min   Short Term Goals: Week 1:  PT Short Term Goal 1 (Week 1): Pt will perform be mobility with modA. PT Short Term Goal 2 (Week 1): Pt will perform sit to stand with maxA +1. PT Short Term Goal 3 (Week 1): Pt will perform bed to chair transfer with modA +1 consistently. PT Short Term Goal 4 (Week 1): Pt will ambulate x25' with modA +2 and LRAD.  Skilled Therapeutic Interventions/Progress Updates:     Pt received seated in WC with legs propped on trash can. Pt reports increased swelling (visually confirmed by PT) in both legs and pain in knees. PT informs RN and pain meds provided at beginning of session. Pt transported to gym via [facility] for time management. Pt attempts stand pivot transfer but does not adequately clear buttocks from seat to initiate transfer. Instead, PT suggests sliding board transfer to decrease strain through knees. Pt completes sliding board transfer to the R with modA and cues for sequencing. Session focused on NMR for sitting balance and trunk control. Pt alternates coming down into side leaning on elbows and pushing back up to midline orientation. Pt completes 3x10 on R and L, each set reaching forearm out further from trunk to increase challenge. PT provides tactile cues and manual overpressure on L distal humerus and forearm for NM feedback and improved motor activation. Pt then performs anterior trunk leans with platform placed between legs, reaching for clothespins and then extending and rotating trunk to the R to place in bucket. Pt progresses to reaching forward and to the L outside BOS to encourage L sided WB. No physical assistance required. Pt perform sit to supine with modA management of legs. In supine, pt performs 3x10 bridges with tactile cueing through L lower  extremity for improved contraction, and 1x10 L hip abduction AAROM. Supine to sit with maxA and cues for sequencing. Sliding board transfer back to [facility] with modA. PT provides pt with elevating leg rests to limit lower body edema. Left seated with alarm intact and all needs within reach.  Therapy Documentation Precautions:  Precautions Precautions: Fall Precaution Comments: Lt hemi with Lt sided edema Restrictions Weight Bearing Restrictions: No    Therapy/Group: Individual Therapy  Beau Fanny, PT, DPT 03/21/2021, 3:50 PM

## 2021-03-21 NOTE — Progress Notes (Signed)
Occupational Therapy Session Note ° °Patient Details  °Name: Joseph Ortega °MRN: [id number] °Date of Birth: 03/30/1955 ° °Today's Date: 03/21/2021 °OT Individual Time: 0820-0915 °OT Individual Time Calculation (min): 55 min  ° ° °Short Term Goals: °Week 1:  OT Short Term Goal 1 (Week 1): Pt will complete BSC transfer with +2 assist to progress with OOB toileting °OT Short Term Goal 2 (Week 1): Pt will complete sit<stand in Stedy with no more than Mod A during 2 consecutive ADL sessions °OT Short Term Goal 3 (Week 1): Pt will assist with 1/3 components of toileting with no more than Mod balance assist ° °Skilled Therapeutic Interventions/Progress Updates:  °  Pt greeted semi-reclined in bed and agreeable to OT treatment session. Pt had spilled urine in bed trying to use urinal. Bed level LB bathing/dressing completed with pt able to wash peri-area in supported sitting, and roll with mod A to reach back and wash buttocks. Worked on hip bridging to help pull up  pants with activation in L hip, but OT assist to support and position lower leg. OT applied TED hose to B LE's for edema management. Pt completed bed mobility with Max A. Stedy used to transfer to wc with bed raised and max A. UB bathing/dressing from wc with hand over hand to integrate L UE for neuro re-ed. Pt with good recall of hemi-dressing techniques requiring only min cues. Pt demonstrated some mild L inattention needing cues to look further left to locate items. Pt reported need to urinate and urinal placed by OT in wc for continent void. Pt left seated in wc with needs met and handoff to next therapist.  ° °OT placed SAEBO e-stim. SAEBO left on for 60 minutes. OT returned to remove SAEBO with skin intact and no adverse reactions.  °Saebo Stim One °330 pulse width °35 Hz pulse rate °On 8 sec/ off 8 sec °Ramp up/ down 2 sec °Symmetrical Biphasic wave form  °Max intensity 110mA at 500 Ohm load ° ° °Therapy Documentation °Precautions:  °Precautions °Precautions:  Fall °Precaution Comments: Lt hemi with Lt sided edema °Restrictions °Weight Bearing Restrictions: No °Pain: ° Denies pain ° ° °Therapy/Group: Individual Therapy ° °Elisabeth S Doe °03/21/2021, 9:19 AM °

## 2021-03-21 NOTE — Progress Notes (Signed)
Patient ID: Joseph Ortega, male   DOB: 1955/08/24, 66 y.o.   MRN: [id number]  SW made efforts to meet with pt to provide updates from team conference, but pt not in room. SW will follow-up.   [id number] left message for pt wife Joseph Ortega [phone]) to provide updates from team conference, and requested follow-up.   Cecile Sheerer, MSW, LCSWA Office: [phone] Cell: [phone] Fax: [phone]

## 2021-03-21 NOTE — Patient Care Conference (Signed)
Inpatient RehabilitationTeam Conference and Plan of Care Update Date: 03/21/2021   Time: 10:35 AM    Patient Name: Joseph Ortega      Medical Record Number: [id number]  Date of Birth: 1955-05-08 Sex: Male         Room/Bed: 4M11C/4M11C-01 Payor Info: Payor: BLUE [employer] BLUE [employer]IELD / Plan: BCBS COMM PPO / Product Type: *No Product type* /    Admit Date/Time:  03/17/2021  1:48 PM  Primary Diagnosis:  ICH (intracerebral hemorrhage) [facility])  Hospital Problems: Principal Problem:   ICH (intracerebral hemorrhage) [facility])    Expected Discharge Date: Expected Discharge Date: 04/15/21  Team Members Present: Physician leading conference: Dr. Genice Rouge Social Worker Present: Cecile Sheerer, LCSWA Nurse Present: Kennyth Arnold, RN PT Present: Malachi Pro, PT OT Present: Kearney Hard, OT PPS Coordinator present : Fae Pippin, SLP     Current Status/Progress Goal Weekly Team Focus  Bowel/Bladder   cont of bowel and bladder. last BM-1/2  remain cont of B/b  assess q shift and PRN   Swallow/Nutrition/ Hydration             ADL's   Max A 2 BADLs and transfers, +2 is helpful  Min A  Self-care retraining, L UE NMR, NMES, sit<>stands, transfers,   Mobility   maxA bed mobility, modA to maxA squat pivot transfers, maxA sit to stand with R hand rail  Supervision  L Hemibody NMR, bed mobility, balance, transfers, initiate gait training   Communication             Safety/Cognition/ Behavioral Observations            Pain   pt c/o neck pain. pain manag. effective  pain>3  assess pain q shift and PRN   Skin   BLE- healing venous stasis  healing venous stasis  assess q shift and PRN     Discharge Planning:  Pt will need to be at supervision level of care at discharge as wife has limited physical abilites (no lifting). Pt will have PRN support from church family. Pt will need to be apporpriate for outpatient therapy due to challenges with obtaining HH due to insurance.   Team  Discussion: No issues overnight. Increased Levemir 37 units BID. Venous Stasis ulcers with Una boots in place. Having incontinent episodes mostly at night. Reports mild neck pain at times. Will have nursing update wound measurements when Una boots changed. Wife has physical issues and uses RW and WC. Church family may be able to assist at discharge. Recommending Outpatient. Max assist +2 for ADL's and transfers. Hard worker. Has dense left arm hemiplegia. E-stem started yesterday. Max assist bed mobility, sit-stand, and squat pivot. Not started gait training and WC mobility difficult. Need SLP consult. Patient on target to meet rehab goals: Min assist to supervision goals.  *See Care Plan and progress notes for long and short-term goals.   Revisions to Treatment Plan:  Adjusting medications  Teaching Needs: Family education, medication/pain management, skin/wound care, safety awareness, transfer/gait training, etc.  Current Barriers to Discharge: Decreased caregiver support, Home enviroment access/layout, Incontinence, Wound care, Lack of/limited family support, and Medication compliance  Possible Resolutions to Barriers: Family education Follow-up PT/OT Order recommended DME     Medical Summary Current Status: CVA/ICH- LBM yesterday- mild neck pain occ; CPAP overnight; needs measurements from leg wounds when Una boots changed- inconitnent episodes secondary to timing.  Barriers to Discharge: Decreased family/caregiver support;Home enviroment access/layout;Insurance for SNF coverage;Medical stability;Weight;Weight bearing restrictions;Wound care;Neurogenic Bowel & Bladder  Barriers to Discharge Comments: wife has back/cardiac issues- no family close by; was caring for wife prior; Possible Resolutions to [employer] Focus: Max A OT; PT- max A for everything; dense L hemiplegia; very little movement right now;  goals min A hopefully, but dispo biggest limiter; start estim; no gait training  yet;  d/c 4 weeks- 04/15/21   Continued Need for Acute Rehabilitation Level of Care: The patient requires daily medical management by a physician with specialized training in physical medicine and rehabilitation for the following reasons: Direction of a multidisciplinary physical rehabilitation program to maximize functional independence : Yes Medical management of patient stability for increased activity during participation in an intensive rehabilitation regime.: Yes Analysis of laboratory values and/or radiology reports with any subsequent need for medication adjustment and/or medical intervention. : Yes   I attest that I was present, lead the team conference, and concur with the assessment and plan of the team.   Tennis Must 03/21/2021, 2:41 PM

## 2021-03-21 NOTE — Progress Notes (Signed)
Physical Therapy Session Note  Patient Details  Name: Joseph Ortega MRN: [id number] Date of Birth: 12-16-1955  Today's Date: 03/21/2021 PT Individual Time: 1630-1700 PT Individual Time Calculation (min): 30 min   Short Term Goals: Week 1:  PT Short Term Goal 1 (Week 1): Pt will perform be mobility with modA. PT Short Term Goal 2 (Week 1): Pt will perform sit to stand with maxA +1. PT Short Term Goal 3 (Week 1): Pt will perform bed to chair transfer with modA +1 consistently. PT Short Term Goal 4 (Week 1): Pt will ambulate x25' with modA +2 and LRAD.  Skilled Therapeutic Interventions/Progress Updates:    Pt received seated in w/c in room, agreeable to PT session. Pt reports pain in his R knee due to OA, reports he has received Tylenol prior to session and declines further intervention. Sit to stand with max to total A in // bars with L knee blocked. In standing pt exhibits trunk and hip rotation to the L, able to utilize visual feedback from mirror to correct posture. Attempt to have pt take a step with RLE to adjust stance, L knee buckles and pt lowered to sitting in w/c. Pt reports feeling fatigued this PM. Demonstrated how to perform w/c mobility with use of R UE/LE. Pt able to propel w/c x 150 ft with min A needed for steering. Pt left seated in w/c in room with needs in reach, quick release belt and chair alarm in place at end of session.  Therapy Documentation Precautions:  Precautions Precautions: Fall Precaution Comments: Lt hemi with Lt sided edema Restrictions Weight Bearing Restrictions: No       Therapy/Group: Individual Therapy   Peter Congo, PT, DPT, CSRS  03/21/2021, 5:12 PM

## 2021-03-22 ENCOUNTER — Encounter (HOSPITAL_BASED_OUTPATIENT_CLINIC_OR_DEPARTMENT_OTHER): Payer: BC Managed Care – PPO | Admitting: Internal Medicine

## 2021-03-22 LAB — GLUCOSE, CAPILLARY
Glucose-Capillary: 144 mg/dL — ABNORMAL HIGH (ref 70–99)
Glucose-Capillary: 211 mg/dL — ABNORMAL HIGH (ref 70–99)
Glucose-Capillary: 173 mg/dL — ABNORMAL HIGH (ref 70–99)
Glucose-Capillary: 219 mg/dL — ABNORMAL HIGH (ref 70–99)

## 2021-03-22 NOTE — Progress Notes (Signed)
Physical Therapy Session Note  Patient Details  Name: Joseph Ortega MRN: [id number] Date of Birth: March 09, 1956  Today's Date: 03/22/2021 PT Individual Time: 1615-1700 PT Individual Time Calculation (min): 45 min   Short Term Goals: Week 1:  PT Short Term Goal 1 (Week 1): Pt will perform be mobility with modA. PT Short Term Goal 2 (Week 1): Pt will perform sit to stand with maxA +1. PT Short Term Goal 3 (Week 1): Pt will perform bed to chair transfer with modA +1 consistently. PT Short Term Goal 4 (Week 1): Pt will ambulate x25' with modA +2 and LRAD.  Skilled Therapeutic Interventions/Progress Updates:    Pt received seated in w/c in room, agreeable to PT session. Pt reports pain in RLE from sitting up in chair x 2 hours. Pt also reports his call button was dysfunctional and was taken by nursing to be replaced and he has been waiting 3 hours for it to be replaced so he was unable to call for assistance to be repositioned in chair. Nursing notified and able to administer pain medication. Obtained new call button for pt and verified that it works. Pt agreeable to work on standing with stedy this date. Sit to stand with mod A x 2 from w/c to stedy. Pt takes seated rest break in perched position on stedy, min A to stand from this position. Standing balance/tolerance x 10 min in stedy with minimal UE support on stedy and close Supervision for balance. Squats 3 x 10 reps with RUE support and LUE supported by therapist, cues for correct exercise performance. Pt requests to return to bed at end of session due to fatigue. Stedy transfer to bed. Sit to supine assist x 2 for trunk control and BLE management. Pt left seated in bed with needs in reach, bed alarm in place.  Therapy Documentation Precautions:  Precautions Precautions: Fall Precaution Comments: Lt hemi with Lt sided edema Restrictions Weight Bearing Restrictions: No       Therapy/Group: Individual Therapy   Peter Congo, PT, DPT,  CSRS  03/22/2021, 5:07 PM

## 2021-03-22 NOTE — Progress Notes (Signed)
Patient ID: Joseph Ortega, male   DOB: 1955/08/26, 66 y.o.   MRN: [id number]  SW met with pt in room to provide updates from team conference, and d/c date 1/28.  SW discussed support at discharge. Pt reports he may have to hire a private sitter to help with self-care needs and physical assistance if needed. SW provided pt with sitter list. SW will continue to provide updates.   Loralee Pacas, MSW, [place] Office: [phone] Cell: [phone] Fax: [phone]

## 2021-03-22 NOTE — Progress Notes (Signed)
RT placed patient on CPAP HS. 2L O2 bleed in needed. Patient tolerating well.  ?

## 2021-03-22 NOTE — Progress Notes (Signed)
Occupational Therapy Session Note  Patient Details  Name: Joseph Ortega MRN: [id number] Date of Birth: [birth year]  Today's Date: 03/22/2021 OT Individual Time: 1130-1200 OT Individual Time Calculation (min): 30 min    Short Term Goals: Week 1:  OT Short Term Goal 1 (Week 1): Pt will complete BSC transfer with +2 assist to progress with OOB toileting OT Short Term Goal 2 (Week 1): Pt will complete sit<stand in Stedy with no more than Mod A during 2 consecutive ADL sessions OT Short Term Goal 3 (Week 1): Pt will assist with 1/3 components of toileting with no more than Mod balance assist   Skilled Therapeutic Interventions/Progress Updates:    Pt greeted at time of session sitting up in wheelchair agreeable to OT session, no pain reported. ADL needs met and pt wanting to work on LUE. Transported to ortho gym and with ACE wrap attached to L hand/wrist for support, performed SCIFIT FWD and backward for 7 minutes switching directions half way through. Pt also attempting single LUE for rotational force on SCIFIT, able to achieve with active shoulder engagement but needing MOD A to fully rotate. Pt demonstrating some active engagement in shoulder and biceps  during session. Discussion throughout regarding family assist at home, visual deficits, driving in the future, etc. Back in room set up alarm on call bell in reach and LUE elevated.   Therapy Documentation Precautions:  Precautions Precautions: Fall Precaution Comments: Lt hemi with Lt sided edema Restrictions Weight Bearing Restrictions: No     Therapy/Group: Individual Therapy  Viona Gilmore 03/22/2021, 7:31 AM

## 2021-03-22 NOTE — Progress Notes (Signed)
Occupational Therapy Session Note  Patient Details  Name: Joseph Ortega MRN: [id number] Date of Birth: [birth year]  Today's Date: 03/22/2021 OT Individual Time: 7342-8768 OT Individual Time Calculation (min): 56 min    Short Term Goals: Week 1:  OT Short Term Goal 1 (Week 1): Pt will complete BSC transfer with +2 assist to progress with OOB toileting OT Short Term Goal 2 (Week 1): Pt will complete sit<stand in Stedy with no more than Mod A during 2 consecutive ADL sessions OT Short Term Goal 3 (Week 1): Pt will assist with 1/3 components of toileting with no more than Mod balance assist  Skilled Therapeutic Interventions/Progress Updates:    Pt received semi-reclined in bed, no c/o pain, agreeable to therapy. Session focus on self-care retraining, activity tolerance, LUE NMR, transfer retraining in prep for improved ADL/IADL/func mobility performance + decreased caregiver burden. Came to sitting EOB with max A to progress hemi-body and to lift trunk. Maintains static sitting balance with close S and B feet support. Stedy transfer with max A of 1 to power up and maintain LUE on bar. Stedy transfer > BSC/toilet. Total A in stedy for toileting tasks post continent void of b/b. Required increased attempts and max A to power up from lower BSC height. Completed UB bathing and dressing with min A to manage LUE. Total A to don underwear/shorts with use of stedy for sit to stand. Attempted multiple sit to stands attempts at sink with poor success to power up. Pt able to complete 10 mini push-ups to facilitate with LUE WB.  Pt left seated in w/c with LUE supported, with safety belt alarm engaged, call bell in reach, and all immediate needs met.    Therapy Documentation Precautions:  Precautions Precautions: Fall Precaution Comments: Lt hemi with Lt sided edema Restrictions Weight Bearing Restrictions: No  Pain: denies   ADL: See Care Tool for more details.  Therapy/Group: Individual  Therapy  Volanda Napoleon MS, OTR/L  03/22/2021, 6:54 AM

## 2021-03-22 NOTE — Progress Notes (Signed)
PROGRESS NOTE   Subjective/Complaints:  Pt reports doesn't remember me from yesterday- LBM yesterday. Denies pain. Still has difficulty telling where L side is in space.    ROS:  Pt denies SOB, abd pain, CP, N/V/C/D, and vision changes   Objective:   No results found. No results for input(s): WBC, HGB, HCT, PLT in the last 72 hours.  No results for input(s): NA, K, CL, CO2, GLUCOSE, BUN, CREATININE, CALCIUM in the last 72 hours.   Intake/Output Summary (Last 24 hours) at 03/22/2021 0839 Last data filed at 03/22/2021 0758 Gross per 24 hour  Intake 600 ml  Output 1300 ml  Net -700 ml        Physical Exam: Vital Signs Blood pressure (!) 175/83, pulse 72, temperature 97.7 F (36.5 C), temperature source Oral, resp. rate 16, height 6' 0.99" (1.854 m), weight 129 kg, SpO2 93 %.   General: awake, alert, appropriate, sitting up in bed; L inattention; NAD HENT: conjugate gaze; oropharynx moist CV: regular rate; no JVD Pulmonary: CTA B/L; no W/R/R- good air movement GI: soft, NT, ND, (+)BS Psychiatric: appropriate- interactive Neurological: Alert; L inattention still noted Musculoskeletal: L hand mild swelling- K tape in place for edema control    Right lower leg: Edema present.     Left lower leg: Edema present.     Comments: Unable to lift left arm against gravity.  Can raise left lower extremity approxi-1 inch off foot rest    L hand propped in what appears uncomfortable angle- readjusted. Neurological:     Mental Status: He is alert.     Comments: Alert and oriented x 3. Normal insight and awareness. Intact Memory. Normal language and speech. Cranial nerve exam unremarkable except for LLQ quadrantanopsia. LUE 1+ pec, biceps and 0/5 wrist and hand. LLE 2/5 HE, KE and 1/5 PF, 0/5 DF. RUE and RLE grossly 5/5. No focal sensory abnl  Sensation intact to LT BUE and BLE  Psychiatric:        Mood and Affect: Mood normal.         Behavior: Behavior normal.      Assessment/Plan: 1. Functional deficits which require 3+ hours per day of interdisciplinary therapy in a comprehensive inpatient rehab setting. Physiatrist is providing close team supervision and 24 hour management of active medical problems listed below. Physiatrist and rehab team continue to assess barriers to discharge/monitor patient progress toward functional and medical goals  Care Tool:  Bathing    Body parts bathed by patient: Chest, Abdomen, Front perineal area, Right upper leg, Face   Body parts bathed by helper: Right arm, Left arm, Buttocks, Right lower leg, Left lower leg, Left upper leg     Bathing assist Assist Level: Maximal Assistance - Patient 24 - 49%     Upper Body Dressing/Undressing Upper body dressing   What is the patient wearing?: Pull over shirt    Upper body assist Assist Level: Moderate Assistance - Patient 50 - 74%    Lower Body Dressing/Undressing Lower body dressing      What is the patient wearing?: Pants, Underwear/pull up     Lower body assist Assist for lower body dressing: Total Assistance -  Patient < 25%     Toileting Toileting Toileting Activity did not occur Press photographer(Clothing management and hygiene only): N/A (no void or bm)  Toileting assist Assist for toileting: Total Assistance - Patient < 25%     Transfers Chair/bed transfer  Transfers assist     Chair/bed transfer assist level: Total Assistance - Patient < 25%     Locomotion Ambulation   Ambulation assist   Ambulation activity did not occur: Safety/medical concerns          Walk 10 feet activity   Assist  Walk 10 feet activity did not occur: Safety/medical concerns        Walk 50 feet activity   Assist Walk 50 feet with 2 turns activity did not occur: Safety/medical concerns         Walk 150 feet activity   Assist Walk 150 feet activity did not occur: Safety/medical concerns         Walk 10 feet on uneven  surface  activity   Assist Walk 10 feet on uneven surfaces activity did not occur: Safety/medical concerns         Wheelchair     Assist Is the patient using a wheelchair?: Yes Type of Wheelchair: Manual    Wheelchair assist level: Dependent - Patient 0% Max wheelchair distance: 150'    Wheelchair 50 feet with 2 turns activity    Assist        Assist Level: Dependent - Patient 0%   Wheelchair 150 feet activity     Assist      Assist Level: Dependent - Patient 0%   Blood pressure (!) 175/83, pulse 72, temperature 97.7 F (36.5 C), temperature source Oral, resp. rate 16, height 6' 0.99" (1.854 m), weight 129 kg, SpO2 93 %.    Medical Problem List and Plan: 1. 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 ?embolic. 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             -30 day outpatient cardiac event monitor recommended to r/o a fib              -patient may  shower             -ELOS/Goals: 10-14 days, supervision to min assist goals with PT and OT, team conf today see conf note   1/4- Con't CIR- PT, OT and SLP- Ktape for LUE 2.  Antithrombotics: -DVT/anticoagulation:  Mechanical: Sequential compression devices, below knee Bilateral lower extremities Pharmaceutical: Lovenox             -antiplatelet therapy: aspirin 81 mg 3. Pain Management: Tylenol 4. Mood: LCSW to evaluate and provide emotional support             -antipsychotic agents: n/a 5. Neuropsych: This patient is capable of making decisions on his own behalf. 6. Skin/Wound Care: routine skin checks.  --Monitor BLE ulcerations. 7. Fluids/Electrolytes/Nutrition: Routine Is and Os and follow chemistries 8. Hypertension: continue valsartan and metoprolol. Provided list of foods that are good for patients with hypertension. 1/4- Hypertensive this AM- 175/83 this  AM- usually controlled- will give 1 day before titrating meds-   9. 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 Hgb A1c = 11.1             -monitor cbg's, adjust regimen as needed  Carb modified diet.   CBG (last 3)  Recent Labs  03/21/21 2112 03/21/21 2259 03/22/21 0620  GLUCAP 192* 169* 144*  Will increase levimir to 37U BID , re eval dose in am   1/4- CBGs 95-192- will give 1 more day then titrate- don't want to go low.  10: Vitamin D deficiency: started on 1,000mg  daily supplement.  11: Hypothyroidism: continue Synthroid and recheck labs in 6 weeks 12: Peripheral neuropathy: continue gabapentin 300 mg twice daily at home 13: Herpes zoster of chest: continue Valtrex for total of 2-3 weeks 14: OSA: home BiPAP, 2L O2  at night and naps 15: History of depression: Cymbalta listed on home meds 16: Tobacco use: Offer nicotine patch and provide cessation counseling 17: EtOH use: Provide counseling. Magnesium level reviewed and was 2.2 on 12/21.  18: Obesity BMI 37.53: Dietary consultation. Provided list of foods that can help with weight loss.  19: Venous insufficiency, bilateral lower extremities: Has had treatment for venous stasis ulcers including Unna boots and follow-up at [facility] wound care center.  Currently with small superficial ulcerations bilateral lower legs.  He routinely wears compression stockings. 20. Hypoalbuminemia: educated regarding low sugar/high protein diet.  21.  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

## 2021-03-22 NOTE — Progress Notes (Signed)
Occupational Therapy Session Note  Patient Details  Name: Joseph Ortega MRN: [id number] Date of Birth: 11-30-1955  Today's Date: 03/22/2021 OT Individual Time: 2694-8546 OT Individual Time Calculation (min): 32 min    Short Term Goals: Week 1:  OT Short Term Goal 1 (Week 1): Pt will complete BSC transfer with +2 assist to progress with OOB toileting OT Short Term Goal 2 (Week 1): Pt will complete sit<stand in Stedy with no more than Mod A during 2 consecutive ADL sessions OT Short Term Goal 3 (Week 1): Pt will assist with 1/3 components of toileting with no more than Mod balance assist  Skilled Therapeutic Interventions/Progress Updates:  Skilled OT intervention completed with focus on NMR of LUE during functional task, core control and activity tolerance. Pt received seated in w/c, agreeable to session. Pt taught back self-ROM for AAROM that previous therapist instructed, with therapist educating on functional task for AAROM using wash cloth. Pt educated on purpose of NMR and how the brain heals after a stroke with neuroplasticity. While sitting in w/c with back unsupported, and BLEs supported on floor, pt participated in L hand NMR using with wash cloth on table to promote activation in shoulder flexion and external rotation. Therapist providing visual target for task and hand over hand assist for stabilization on wash cloth only, with pt able to complete 10 shoulder flexion swipes on table and 10 internal/external rotation swipes. Pt with increased challenge on external rotation with up to min A needed to advance external rotators, as well as verbal/tactile cues needed for scapular depression due to compensatory elevation during activity. Pt educated that he could do this during breaks with therapy for more practice. Pt left seated in w/c with belt alarm on, call bell on pt's intact side and all needs in reach at end of session.   Therapy Documentation Precautions:  Precautions Precautions:  Fall Precaution Comments: Lt hemi with Lt sided edema Restrictions Weight Bearing Restrictions: No  Pain: No c/o pain   Therapy/Group: Individual Therapy  Acel Natzke E Kamorie Aldous 03/22/2021, 7:59 AM

## 2021-03-22 NOTE — Evaluation (Signed)
Speech Language Pathology Assessment and Plan  Patient Details  Name: Joseph Ortega MRN: [id number] Date of Birth: 10-11-1955  SLP Diagnosis: Cognitive Impairments  Rehab Potential: Excellent ELOS: 3 weeks    Today's Date: 03/22/2021 SLP Individual Time: 1030-1130 SLP Individual Time Calculation (min): 60 min   Hospital Problem: Principal Problem:   ICH (intracerebral hemorrhage) ([place])  Past Medical History:  Past Medical History:  Diagnosis Date   Diabetes mellitus without complication ([place])    Hypertension    Thyroid disease    Past Surgical History:  Past Surgical History:  Procedure Laterality Date   HERNIA REPAIR     IR ANGIO INTRA EXTRACRAN SEL COM CAROTID INNOMINATE BILAT MOD SED  03/09/2021   IR ANGIO VERTEBRAL SEL VERTEBRAL BILAT MOD SED  03/09/2021   IR US GUIDE VASC ACCESS RIGHT  03/09/2021    Assessment / Plan / Recommendation Clinical Impression 66 year old male with history of Dm T2, HTN, thyroid disease and OSA who presented to [facility] on 03/08/21 with sudden onset of left sided weakness and dysarthria. 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 Patient started on Keppra and Cleviprex. Also noted incidentally to have a rash on left side of chest consistent with shingles. Transferred to [facility] hospital for Neuro ICU. 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 Patient requires inpatient physical medicine and rehabilitation and patient admitted 03/17/21. OT/PT note cognitive deficits, therefore, skilled SLP evaluation today to assess cognitive-linguistic function.    Patient administered the Cognistat and scored WFL on all subtests with the exception of severe impairments in visual-construction tasks. During informal evaluation, patient demonstrated decreased recall of functional information regarding education  from staff and decreased emergent awareness of errors during visual-construction tasks. Due to patient's high-level of independence and cognitive functioning at baseline, recommend skilled SLP intervention to maximize his cognitive functioning and overall functional independence prior to discharge.    Skilled Therapeutic Interventions          Administered a cognitive-linguistic evaluation, please see above for details. SLP provided extra time and overall Min verbal cues for patient to self-monitor and correct errors during visual-perceptual tasks. Patient verbalized awareness of visual deficits but unable to fully explain and recall education from the physician. Patient left upright in wheelchair with alarm on and all needs within reach. Continue with current plan of care.    SLP Assessment  Patient will need skilled [facility] during CIR admission    Recommendations  Oral Care Recommendations: Oral care BID Recommendations for Other Services: Neuropsych consult Patient destination: Home Follow up Recommendations: None Equipment Recommended: None recommended by SLP    SLP Frequency 1 to 3 out of 7 days   SLP Duration  SLP Intensity  SLP Treatment/Interventions 3 weeks  Minumum of 1-2 x/day, 30 to 90 minutes  Cognitive remediation/compensation;Internal/external aids;Therapeutic Activities;Cueing hierarchy;Environmental controls;Functional tasks;Patient/family education    Pain No/Denies Pain   SLP Evaluation Cognition Overall Cognitive Status: Impaired/Different from baseline Arousal/Alertness: Awake/alert Orientation Level: Oriented X4 Memory: Impaired Memory Impairment: Retrieval deficit Awareness: Impaired Awareness Impairment: Emergent impairment Problem Solving: Impaired Problem Solving Impairment: Functional complex Safety/Judgment: Appears intact  Comprehension Auditory Comprehension Overall Auditory Comprehension: Appears within functional  limits for tasks assessed Visual Recognition/Discrimination Discrimination: Not tested Reading Comprehension Reading Status: Not tested Expression Expression Primary Mode of Expression: Verbal Verbal Expression Overall Verbal Expression: Appears within  functional limits for tasks assessed Written Expression Dominant Hand: Right Written Expression: Not tested Oral Motor Oral Motor/Sensory Function Overall Oral Motor/Sensory Function: Within functional limits Motor Speech Overall Motor Speech: Appears within functional limits for tasks assessed Respiration: Within functional limits Phonation: Normal Articulation: Within functional limitis Intelligibility: Intelligible Motor Planning: Witnin functional limits  Care Tool Care Tool Cognition Ability to hear (with hearing aid or hearing appliances if normally used Ability to hear (with hearing aid or hearing appliances if normally used): 0. Adequate - no difficulty in normal conservation, social interaction, listening to TV   Expression of Ideas and Wants Expression of Ideas and Wants: 4. Without difficulty (complex and basic) - expresses complex messages without difficulty and with speech that is clear and easy to understand   Understanding Verbal and Non-Verbal Content Understanding Verbal and Non-Verbal Content: 4. Understands (complex and basic) - clear comprehension without cues or repetitions  Memory/Recall Ability Memory/Recall Ability : Current season;Staff names and faces;Location of own room    Short Term Goals: Week 1: SLP Short Term Goal 1 (Week 1): Patient will demonstrate functional problem solving during complex tasks with Mod I. SLP Short Term Goal 2 (Week 1): Patient will recall new daily and functional information with Mod I. SLP Short Term Goal 3 (Week 1): Patient will self-monitor and correct errors during functional tasks with Mod I.  Refer to Care Plan for Long Term Goals  Recommendations for other services:  Neuropsych  Discharge Criteria: Patient will be discharged from SLP if patient refuses treatment 3 consecutive times without medical reason, if treatment goals not met, if there is a change in medical status, if patient makes no progress towards goals or if patient is discharged from hospital.  The above assessment, treatment plan, treatment alternatives and goals were discussed and mutually agreed upon: by patient  Ramar Nobrega 03/22/2021, 3:25 PM

## 2021-03-23 LAB — GLUCOSE, CAPILLARY
Glucose-Capillary: 170 mg/dL — ABNORMAL HIGH (ref 70–99)
Glucose-Capillary: 183 mg/dL — ABNORMAL HIGH (ref 70–99)
Glucose-Capillary: 129 mg/dL — ABNORMAL HIGH (ref 70–99)
Glucose-Capillary: 98 mg/dL (ref 70–99)

## 2021-03-23 MED ORDER — CARVEDILOL 6.25 MG PO TABS
9.3750 mg | ORAL_TABLET | Freq: Two times a day (BID) | ORAL | Status: DC
  Administered 2021-03-23 – 2021-03-26 (×6): 9.375 mg via ORAL
  Filled 2021-03-23 (×6): qty 1

## 2021-03-23 NOTE — Progress Notes (Signed)
Physical Therapy Session Note  Patient Details  Name: Joseph Ortega MRN: [id number] Date of Birth: [birth year]  Today's Date: 03/23/2021 PT Individual Time: 1117-1200 PT Individual Time Calculation (min): 43 min   Short Term Goals: Week 1:  PT Short Term Goal 1 (Week 1): Pt will perform be mobility with modA. PT Short Term Goal 2 (Week 1): Pt will perform sit to stand with maxA +1. PT Short Term Goal 3 (Week 1): Pt will perform bed to chair transfer with modA +1 consistently. PT Short Term Goal 4 (Week 1): Pt will ambulate x25' with modA +2 and LRAD.  Skilled Therapeutic Interventions/Progress Updates:    Pt seated in w/c on arrival and agreeable to therapy. No complaint of pain. Pt transported to therapy gym for time management and energy conservation. Session focused on weight bearing activity and pregait activity in // bars for improve LE activation and progressing toward gait. Sit to stand with min-mod A in bars throughout session. Therapist provided knee block and facilitated hip extension with all standing and weight shift at times. Pt directed mini squats with cues for equal weight bearing for strength and endurance. Progressed to stepping RLE forward and back for forced use of LLE. Then progressed to taking 2 steps forward and back x 2 bouts. First attempt therapist assisted with advancing LLE, but second attempt pt was able to advance limb independently. Pt then returned to room and remained in w/c, was left with all needs in reach and alarm active.   Therapy Documentation Precautions:  Precautions Precautions: Fall Precaution Comments: Lt hemi with Lt sided edema Restrictions Weight Bearing Restrictions: No    Therapy/Group: Individual Therapy  Juluis Rainier 03/23/2021, 4:05 PM

## 2021-03-23 NOTE — Progress Notes (Signed)
Speech Language Pathology Daily Session Note  Patient Details  Name: Laryan Bowersock MRN: [id number] Date of Birth: 12-Apr-1955  Today's Date: 03/23/2021 SLP Individual Time: 0900-0930 SLP Individual Time Calculation (min): 30 min  Short Term Goals: Week 1: SLP Short Term Goal 1 (Week 1): Patient will demonstrate functional problem solving during complex tasks with Mod I. SLP Short Term Goal 2 (Week 1): Patient will recall new daily and functional information with Mod I. SLP Short Term Goal 3 (Week 1): Patient will self-monitor and correct errors during functional tasks with Mod I.  Skilled Therapeutic Interventions: Skilled treatment session focused on cognitive deficits. SLP facilitated session by providing overall supervision level verbal cues for compensatory strategies during complex visual scanning/perceptual tasks. Patient participated in the [place] Visual Cancellation Training task-Single Stimuli. Patient missed 10 out of 105 occurences, 8 of which were in the center of the sheet. Patient later participated in another visual cancellation task and found all occurences with 100% accuracy. Patient left upright in the wheelchair with alarm on and all needs within reach. Continue with current plan of care.      Pain Pain Assessment Pain Scale: 0-10 Pain Score: 0-No pain  Therapy/Group: Individual Therapy  Chinwe Lope 03/23/2021, 12:38 PM

## 2021-03-23 NOTE — Progress Notes (Signed)
Occupational Therapy Session Note  Patient Details  Name: Joseph Ortega MRN: [id number] Date of Birth: 05-28-1955  Today's Date: 03/24/2021 OT Individual Time: 1300-1400  OT Individual Time Calculation (min): 60 min   Short Term Goals: Week 1:  OT Short Term Goal 1 (Week 1): Pt will complete BSC transfer with +2 assist to progress with OOB toileting OT Short Term Goal 2 (Week 1): Pt will complete sit<stand in Stedy with no more than Mod A during 2 consecutive ADL sessions OT Short Term Goal 3 (Week 1): Pt will assist with 1/3 components of toileting with no more than Mod balance assist  Skilled Therapeutic Interventions/Progress Updates:    Pt greeted in the w/c and premedicated for pain. He was asking for a better BSC than the one that was placed over his toilet, pt c/o tight fit around hips. OT retrieved an extra wide bariatric BSC for pt. Practiced simulated transfer with pt requiring Mod A to rise into standing using Stedy. Worked on technique for sit<stands, emphasizing forward scooting and anterior weight shifting. Also focused on pt breathing out with exertion due to breath holding. Pt completed multiple partial power ups on his own, per request, focusing on pt unweighting buttocks as much as he could independently. He did need Mod A to rise into standing with Stedy. He also needed some pain medicine at this time due to increase in Rt knee pain. While sitting on the [facility], worked extensively on reciprocal scooting for increasing independence when positioning himself during self care completion and to work on trunk control. Pt with improved technique given repetition and cuing, Mod A for scooting the Lt side, Min for the Rt. Stedy used for transfer back to bed, Mod A to return to supine. He remained in bed with all needs within reach, bed alarm set, and hemiparetic side supported/elevated.   Therapy Documentation Precautions:  Precautions Precautions: Fall Precaution Comments: Lt hemi with Lt  sided edema Restrictions Weight Bearing Restrictions: No Vital Signs: Therapy Vitals Temp: 98.1 F (36.7 C) Temp Source: Oral Pulse Rate: 74 Resp: 17 BP: (!) 160/77 Patient Position (if appropriate): Lying Oxygen Therapy SpO2: 94 % O2 Device: Room Air ADL: ADL Eating: Set up (per most recent staff documentation) Grooming: Moderate assistance Where Assessed-Grooming: Edge of bed Upper Body Bathing: Moderate assistance Where Assessed-Upper Body Bathing: Edge of bed Lower Body Bathing: Maximal assistance Where Assessed-Lower Body Bathing: Edge of bed Upper Body Dressing: Moderate assistance Where Assessed-Upper Body Dressing: Edge of bed Lower Body Dressing: Dependent Where Assessed-Lower Body Dressing: Edge of bed Toileting: Not assessed Toilet Transfer: Not assessed Tub/Shower Transfer: Not assessed         :  :     Therapy/Group: Individual Therapy  Nadia Torr A Anijah Spohr 03/24/2021, 4:11 PM

## 2021-03-23 NOTE — Progress Notes (Signed)
PROGRESS NOTE   Subjective/Complaints:  Pt had a "short" first session with OT but has 3 more prior to lunch. No new complaints. Is moving bowels and bladder well. No pain  ROS: Patient denies fever, rash, sore throat, blurred vision, nausea, vomiting, diarrhea, cough, shortness of breath or chest pain, joint or back pain, headache, or mood change.    Objective:   No results found. No results for input(s): WBC, HGB, HCT, PLT in the last 72 hours.  No results for input(s): NA, K, CL, CO2, GLUCOSE, BUN, CREATININE, CALCIUM in the last 72 hours.   Intake/Output Summary (Last 24 hours) at 03/23/2021 0918 Last data filed at 03/23/2021 0807 Gross per 24 hour  Intake 600 ml  Output 600 ml  Net 0 ml        Physical Exam: Vital Signs Blood pressure (!) 185/98, pulse 70, temperature 98.1 F (36.7 C), resp. rate 16, height 6' 0.99" (1.854 m), weight 129 kg, SpO2 95 %.   Constitutional: No distress . Vital signs reviewed. obese HEENT: NCAT, EOMI, oral membranes moist Neck: supple Cardiovascular: RRR without murmur. No JVD    Respiratory/Chest: CTA Bilaterally without wheezes or rales. Normal effort    GI/Abdomen: BS +, non-tender, non-distended Ext: no clubbing, cyanosis, or edema Psych: pleasant and cooperative  Musculoskeletal: L hand mild swelling- K tape in place for edema control    Right lower leg: Edema present.     Left lower leg: Edema present.   Neurological:     Mental Status: He is alert.     Comments: Alert and oriented x 3. Normal insight and awareness. Intact Memory. Normal language and speech. Cranial nerve exam unremarkable except for LLQ quadrantanopsia persists. LUE 2- to 2/5 pec, biceps and 1+/5 wrist and finger flexion. Trace to absent in extension. LLE 2+/5 HE, KE and 1+/5 PF, 0-tr/5 DF. RUE and RLE grossly 5/5. No focal sensory abnl  Sensation intact to LT BUE and BLE        Assessment/Plan: 1.  Functional deficits which require 3+ hours per day of interdisciplinary therapy in a comprehensive inpatient rehab setting. Physiatrist is providing close team supervision and 24 hour management of active medical problems listed below. Physiatrist and rehab team continue to assess barriers to discharge/monitor patient progress toward functional and medical goals  Care Tool:  Bathing    Body parts bathed by patient: Right arm, Left arm, Chest, Abdomen   Body parts bathed by helper: Right arm, Left arm, Buttocks, Right lower leg, Left lower leg, Left upper leg     Bathing assist Assist Level: Minimal Assistance - Patient > 75%     Upper Body Dressing/Undressing Upper body dressing   What is the patient wearing?: Pull over shirt    Upper body assist Assist Level: Minimal Assistance - Patient > 75%    Lower Body Dressing/Undressing Lower body dressing      What is the patient wearing?: Pants, Underwear/pull up     Lower body assist Assist for lower body dressing: Dependent - Patient 0%     Toileting Toileting Toileting Activity did not occur (Clothing management and hygiene only): N/A (no void or bm)  Toileting  assist Assist for toileting: Total Assistance - Patient < 25%     Transfers Chair/bed transfer  Transfers assist     Chair/bed transfer assist level: Total Assistance - Patient < 25%     Locomotion Ambulation   Ambulation assist   Ambulation activity did not occur: Safety/medical concerns          Walk 10 feet activity   Assist  Walk 10 feet activity did not occur: Safety/medical concerns        Walk 50 feet activity   Assist Walk 50 feet with 2 turns activity did not occur: Safety/medical concerns         Walk 150 feet activity   Assist Walk 150 feet activity did not occur: Safety/medical concerns         Walk 10 feet on uneven surface  activity   Assist Walk 10 feet on uneven surfaces activity did not occur: Safety/medical  concerns         Wheelchair     Assist Is the patient using a wheelchair?: Yes Type of Wheelchair: Manual    Wheelchair assist level: Dependent - Patient 0% Max wheelchair distance: 150'    Wheelchair 50 feet with 2 turns activity    Assist        Assist Level: Dependent - Patient 0%   Wheelchair 150 feet activity     Assist      Assist Level: Dependent - Patient 0%   Blood pressure (!) 185/98, pulse 70, temperature 98.1 F (36.7 C), resp. rate 16, height 6' 0.99" (1.854 m), weight 129 kg, SpO2 95 %.    Medical Problem List and Plan: 1. 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 ?embolic. 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             -30 day outpatient cardiac event monitor recommended to r/o a fib              -patient may  shower             -ELOS/Goals: 10-14 days, supervision to min assist goals with PT and OT, team conf today see conf note   -Continue CIR therapies including PT, OT, and SLP  2.  Antithrombotics: -DVT/anticoagulation:  Mechanical: Sequential compression devices, below knee Bilateral lower extremities Pharmaceutical: Lovenox             -antiplatelet therapy: aspirin 81 mg 3. Pain Management: Tylenol 4. Mood: LCSW to evaluate and provide emotional support             -antipsychotic agents: n/a 5. Neuropsych: This patient is capable of making decisions on his own behalf. 6. Skin/Wound Care: routine skin checks.  --Monitor BLE ulcerations. 7. Fluids/Electrolytes/Nutrition: Routine Is and Os and follow chemistries 8. Hypertension: continue valsartan and coreg.    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 Hgb A1c = 11.1             -monitor cbg's, adjust regimen as needed  Carb modified diet.    CBG (last 3)  Recent Labs    03/22/21 1646 03/22/21 2117 03/23/21 0552  GLUCAP 173* 219* 170*  Increased levimir to 37U BID on 1/2   1/5 increase to 39u bid 10: Vitamin D deficiency: started on 1,000mg  daily supplement.  11: Hypothyroidism: continue Synthroid and recheck labs  in 6 weeks 12: Peripheral neuropathy: continue gabapentin 300 mg twice daily at home 13: Herpes zoster of chest: continue Valtrex for total of 2-3 weeks 14: OSA: home BiPAP, 2L O2  at night and naps 15: History of depression: Cymbalta listed on home meds 16: Tobacco use: Offer nicotine patch and provide cessation counseling 17: EtOH use: Provide counseling. Magnesium level reviewed and was 2.2 on 12/21.  18: Obesity BMI 37.53: Dietary consultation. Provided list of foods that can help with weight loss.  19: Venous insufficiency, bilateral lower extremities: Has had treatment for venous stasis ulcers including Unna boots and follow-up at [facility] wound care center.  Currently with small superficial ulcerations bilateral lower legs.  He routinely wears compression stockings. 20. Hypoalbuminemia: educated regarding low sugar/high protein diet.      LOS: 6 days A FACE TO FACE EVALUATION WAS PERFORMED  Ranelle OysterZachary T Jalexia Lalli 03/23/2021, 9:18 AM

## 2021-03-23 NOTE — Progress Notes (Signed)
Occupational Therapy Session Note  Patient Details  Name: Joseph Ortega MRN: [id number] Date of Birth: [birth year]  Today's Date: 03/23/2021 OT Individual Time: [id number] OT Individual Time Calculation (min): 45 min    Short Term Goals: Week 1:  OT Short Term Goal 1 (Week 1): Pt will complete BSC transfer with +2 assist to progress with OOB toileting OT Short Term Goal 2 (Week 1): Pt will complete sit<stand in [place] with no more than Mod A during 2 consecutive ADL sessions OT Short Term Goal 3 (Week 1): Pt will assist with 1/3 components of toileting with no more than Mod balance assist  Skilled Therapeutic Interventions/Progress Updates:    Pt resting in bed upon arrival and agreeable to therapy. Pt declined changing clothing this morning. Supine>sit EOB with min A and mod verbal cues for sequencing. Sit>stand in [place] with mod A from EOB. Transition to gym and tranfser to EOM. Focus on LUE active movement using stool and sliding board to facilitate Lt shoulder flexion. Pt able to push/pull stool balanced on 2 legs. Min A for LUE slides on level slide board and mod A for LUE slides on inclined slide board. Pt returned to room and remained in w/c. Belt alarm activated. All needs within reach.   Therapy Documentation Precautions:  Precautions Precautions: Fall Precaution Comments: Lt hemi with Lt sided edema Restrictions Weight Bearing Restrictions: No  Pain:  Pt denies pain this morning   Therapy/Group: Individual Therapy  Leroy Libman 03/23/2021, 9:55 AM

## 2021-03-23 NOTE — Progress Notes (Signed)
Occupational Therapy Session Note  Patient Details  Name: Joseph Ortega MRN: [id number] Date of Birth: 1955-11-30  Today's Date: 03/23/2021 OT Individual Time: 3845-3646 OT Individual Time Calculation (min): 70 min   Short Term Goals: Week 1:  OT Short Term Goal 1 (Week 1): Pt will complete BSC transfer with +2 assist to progress with OOB toileting OT Short Term Goal 2 (Week 1): Pt will complete sit<stand in [place] with no more than Mod A during 2 consecutive ADL sessions OT Short Term Goal 3 (Week 1): Pt will assist with 1/3 components of toileting with no more than Mod balance assist  Skilled Therapeutic Interventions/Progress Updates:    Pt greeted seated in wc and agreeable to OT treatment session focused on L UE NMR. Pt brought to therapy gym in wc and L UE placed on UE Ergometer using ACE wrap to maintain grasp on L handle. Pt with no grasp noted, but able to achivate shoulder and scapula to push handles. 5 mins, with extended rest break, then 3 more minutes.Worked on sit<>stands in standing frame with pt able to power up into standing grabbing from standing frame tray and mod A.. While standing facilitated L UE NMR with weight bearing towel pushes while standing. Pt with good activation of shoulder to push across body. Pt stood for 4 bouts and performed 30 reps on each stand. OT incorporated pt preferred christian music. Pt returned to room and OT assisted with donning TED hose using friction reducing bag. Pt left seated in wc with alarm belt on and needs met.   OT placed SAEBO e-stim on wrist extensors. SAEBO left on for 60 minutes. OT returned to remove SAEBO with skin intact and no adverse reactions.  Sa[place]o Stim One 330 pulse width 35 Hz pulse rate On 8 sec/ off 8 sec Ramp up/ down 2 sec Symmetrical Biphasic wave form  Max intensity 139m at 500 Ohm load   Therapy Documentation Precautions:  Precautions Precautions: Fall Precaution Comments: Lt hemi with Lt sided  edema Restrictions Weight Bearing Restrictions: No  Pain:  Denies pain   Therapy/Group: Individual Therapy  EValma Cava1/07/2021, 10:04 AM

## 2021-03-23 NOTE — Progress Notes (Signed)
Pt placed on CPAP tolerating well. 

## 2021-03-24 LAB — GLUCOSE, CAPILLARY
Glucose-Capillary: 103 mg/dL — ABNORMAL HIGH (ref 70–99)
Glucose-Capillary: 206 mg/dL — ABNORMAL HIGH (ref 70–99)
Glucose-Capillary: 173 mg/dL — ABNORMAL HIGH (ref 70–99)
Glucose-Capillary: 169 mg/dL — ABNORMAL HIGH (ref 70–99)

## 2021-03-24 LAB — CBC
HCT: 36.7 % — ABNORMAL LOW (ref 39.0–52.0)
Hemoglobin: 12.6 g/dL — ABNORMAL LOW (ref 13.0–17.0)
MCH: 32.1 pg (ref 26.0–34.0)
MCHC: 34.3 g/dL (ref 30.0–36.0)
MCV: 93.6 fL (ref 80.0–100.0)
Platelets: 198 10*3/uL (ref 150–400)
RBC: 3.92 MIL/uL — ABNORMAL LOW (ref 4.22–5.81)
RDW: 13.2 % (ref 11.5–15.5)
WBC: 6.8 10*3/uL (ref 4.0–10.5)
nRBC: 0 % (ref 0.0–0.2)

## 2021-03-24 LAB — BASIC METABOLIC PANEL
Anion gap: 5 (ref 5–15)
BUN: 16 mg/dL (ref 8–23)
CO2: 29 mmol/L (ref 22–32)
Calcium: 8.3 mg/dL — ABNORMAL LOW (ref 8.9–10.3)
Chloride: 103 mmol/L (ref 98–111)
Creatinine, Ser: 0.66 mg/dL (ref 0.61–1.24)
GFR, Estimated: >60 mL/min (ref >60)
Glucose, Bld: 102 mg/dL — ABNORMAL HIGH (ref 70–99)
Potassium: 3.8 mmol/L (ref 3.5–5.1)
Sodium: 137 mmol/L (ref 135–145)

## 2021-03-24 MED ORDER — JUVEN PO PACK
1.0000 | PACK | Freq: Every day | ORAL | Status: DC
  Administered 2021-03-24 – 2021-04-17 (×11): 1 via ORAL
  Filled 2021-03-24 (×7): qty 1

## 2021-03-24 NOTE — Plan of Care (Signed)
°  Problem: RH Awareness Goal: LTG: Patient will demonstrate awareness during functional activites type of (OT) Description: LTG: Patient will demonstrate awareness during functional activites type of (OT) Flowsheets (Taken 03/24/2021 1536) Patient will demonstrate awareness during functional activites type of: Anticipatory LTG: Patient will demonstrate awareness during functional activites type of (OT): Modified Independent

## 2021-03-24 NOTE — Progress Notes (Signed)
Occupational Therapy Session Note  Patient Details  Name: Joseph Ortega MRN: [id number] Date of Birth: December 16, 1955  Today's Date: 03/24/2021 OT Individual Time: 1003-1100 OT Individual Time Calculation (min): 57 min   Short Term Goals: Week 1:  OT Short Term Goal 1 (Week 1): Pt will complete BSC transfer with +2 assist to progress with OOB toileting OT Short Term Goal 2 (Week 1): Pt will complete sit<stand in [place] with no more than Mod A during 2 consecutive ADL sessions OT Short Term Goal 3 (Week 1): Pt will assist with 1/3 components of toileting with no more than Mod balance assist  Skilled Therapeutic Interventions/Progress Updates:      Pt greeted semi-reclined in bed and agreeable to OT treatment session. Pt agreeable to shower today. OT set-up wide BSC in shower raised all the way up. Pt needed mod A for bed mobility with improved L hip activation to bring towards EOB. Pt then stood in [place] with mod A of 1. [place] transfer into shower. Hand over hand to integrate L UE into bathing tasks for neuro re-ed. Improved shoulder activation. Cutout in [facility] used for OT to assist with ashing buttocks and lower legs. Pt needed max A to get to standing from shower in [place]. Worked on dressing strategies with max A for LB, but good recall of UB dressing strategies, needing min A to complete. Pt left seated in wc at end of session with alarm belt on, call bell in reach, and needs met.   OT placed SAEBO e-stim on wrist extensors. SAEBO left on for 60 minutes. OT returned to remove SAEBO with skin intact and no adverse reactions.  Saebo Stim One 330 pulse width 35 Hz pulse rate On 8 sec/ off 8 sec Ramp up/ down 2 sec Symmetrical Biphasic wave form  Max intensity 123m at 500 Ohm load  Therapy Documentation Precautions:  Precautions Precautions: Fall Precaution Comments: Lt hemi with Lt sided edema Restrictions Weight Bearing Restrictions: No Pain:  Denies pain   Therapy/Group: Individual  Therapy  EValma Cava1/08/2021, 3:27 PM

## 2021-03-24 NOTE — Progress Notes (Addendum)
PROGRESS NOTE   Subjective/Complaints:  Pt did well through course of yesterday. Was happy to have taken a couple steps with PT! Slept well last night.   ROS: Patient denies fever, rash, sore throat, blurred vision, nausea, vomiting, diarrhea, cough, shortness of breath or chest pain, joint or back pain, headache, or mood change.    Objective:   No results found. Recent Labs    03/24/21 0503  WBC 6.8  HGB 12.6*  HCT 36.7*  PLT 198    Recent Labs    03/24/21 0503  NA 137  K 3.8  CL 103  CO2 29  GLUCOSE 102*  BUN 16  CREATININE 0.66  CALCIUM 8.3*     Intake/Output Summary (Last 24 hours) at 03/24/2021 7106 Last data filed at 03/24/2021 0800 Gross per 24 hour  Intake 600 ml  Output 1790 ml  Net -1190 ml        Physical Exam: Vital Signs Blood pressure (!) 164/82, pulse 63, temperature 97.7 F (36.5 C), temperature source Oral, resp. rate 18, height 6' 0.99" (1.854 m), weight 129 kg, SpO2 97 %.   Constitutional: No distress . Vital signs reviewed. HEENT: NCAT, EOMI, oral membranes moist Neck: supple Cardiovascular: RRR without murmur. No JVD    Respiratory/Chest: CTA Bilaterally without wheezes or rales. Normal effort    GI/Abdomen: BS +, non-tender, non-distended Ext: no clubbing, cyanosis, or edema Psych: pleasant and cooperative  Musculoskeletal: left hand swelling, k-tape    Right lower leg: Edema present.     Left lower leg: Edema present.   Neurological:     Mental Status: He is alert.     Comments: Alert and oriented x 3. Normal insight and awareness. Intact Memory. Normal language and speech. Cranial nerve exam unremarkable except for LLQ quadrantanopsia persists. LUE 2- to 2/5 pec, biceps and 1+/5 wrist and finger flexion. Trace to absent in extension. LLE 2+/5 HE, KE and 1+/5 PF, 0-tr/5 DF. RUE and RLE grossly 5/5. No focal sensory abnl  Sensation intact to LT BUE and BLE       Assessment/Plan: 1. Functional deficits which require 3+ hours per day of interdisciplinary therapy in a comprehensive inpatient rehab setting. Physiatrist is providing close team supervision and 24 hour management of active medical problems listed below. Physiatrist and rehab team continue to assess barriers to discharge/monitor patient progress toward functional and medical goals  Care Tool:  Bathing    Body parts bathed by patient: Right arm, Left arm, Chest, Abdomen   Body parts bathed by helper: Right arm, Left arm, Buttocks, Right lower leg, Left lower leg, Left upper leg     Bathing assist Assist Level: Minimal Assistance - Patient > 75%     Upper Body Dressing/Undressing Upper body dressing   What is the patient wearing?: Pull over shirt    Upper body assist Assist Level: Minimal Assistance - Patient > 75%    Lower Body Dressing/Undressing Lower body dressing      What is the patient wearing?: Pants, Underwear/pull up     Lower body assist Assist for lower body dressing: Dependent - Patient 0%     Toileting Toileting Toileting Activity did not  occur Press photographer(Clothing management and hygiene only): N/A (no void or bm)  Toileting assist Assist for toileting: Total Assistance - Patient < 25%     Transfers Chair/bed transfer  Transfers assist     Chair/bed transfer assist level: Total Assistance - Patient < 25%     Locomotion Ambulation   Ambulation assist   Ambulation activity did not occur: Safety/medical concerns  Assist level: Maximal Assistance - Patient 25 - 49% Assistive device: Parallel bars Max distance: 2 ft   Walk 10 feet activity   Assist  Walk 10 feet activity did not occur: Safety/medical concerns        Walk 50 feet activity   Assist Walk 50 feet with 2 turns activity did not occur: Safety/medical concerns         Walk 150 feet activity   Assist Walk 150 feet activity did not occur: Safety/medical concerns          Walk 10 feet on uneven surface  activity   Assist Walk 10 feet on uneven surfaces activity did not occur: Safety/medical concerns         Wheelchair     Assist Is the patient using a wheelchair?: Yes Type of Wheelchair: Manual    Wheelchair assist level: Dependent - Patient 0% Max wheelchair distance: 150'    Wheelchair 50 feet with 2 turns activity    Assist        Assist Level: Dependent - Patient 0%   Wheelchair 150 feet activity     Assist      Assist Level: Dependent - Patient 0%   Blood pressure (!) 164/82, pulse 63, temperature 97.7 F (36.5 C), temperature source Oral, resp. rate 18, height 6' 0.99" (1.854 m), weight 129 kg, SpO2 97 %.    Medical Problem List and Plan: 1. 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 ?embolic. 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             -30 day outpatient cardiac event monitor recommended to r/o a fib              -patient may  shower             -ELOS/Goals: 10-14 days, supervision to min assist goals with PT and OT  -Continue CIR therapies including PT, OT   2.  Antithrombotics: -DVT/anticoagulation:  Mechanical: Sequential compression devices, below knee Bilateral lower extremities Pharmaceutical: Lovenox             -antiplatelet therapy: aspirin 81 mg 3. Pain Management: Tylenol 4. Mood: LCSW to evaluate and provide emotional support             -antipsychotic agents: n/a 5. Neuropsych: This patient is capable of making decisions on his own behalf. 6. Skin/Wound Care: routine skin checks.  --Monitor BLE ulcerations. 7. Fluids/Electrolytes/Nutrition: eating well  -protein supp for hypoalbuminemia  -I personally reviewed the patient's labs today.   8. Hypertension: continue valsartan and coreg.    -diet discussed 1/6 persistent HTN: sl increased coreg  to 9.375mg  bid on 1/5--observe for pattern today 9. 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 Hgb A1c = 11.1             -monitor cbg's, adjust regimen as needed  Carb modified diet.   CBG (last 3)  Recent Labs    03/23/21 1604 03/23/21 2052 03/24/21 0632  GLUCAP 129*  98 103*        1/6 levimir  increased to 39u bid 1/5--improved so far. observe today 10: Vitamin D deficiency: started on 1,000mg  daily supplement.  11: Hypothyroidism: continue Synthroid and recheck labs in 6 weeks 12: Peripheral neuropathy: continue gabapentin 300 mg twice daily at home 13: Herpes zoster of chest: continue Valtrex for total of 2-3 weeks 14: OSA: home BiPAP, 2L O2  at night and naps 15: History of depression: Cymbalta listed on home meds 16: Tobacco use: Offer nicotine patch and provide cessation counseling 17: EtOH use:   Magnesium level reviewed and was 2.2 on 12/21.  Counseling as appropriate 18: Obesity BMI 37.53: Dietary consultation. Provided list of foods that can help with weight loss.  19: Venous insufficiency, bilateral lower extremities: Has had treatment for venous stasis ulcers including Unna boots and follow-up at [facility] wound care center.  Currently with small superficial ulcerations bilateral lower legs.  He routinely wears compression stockings. Legs are stable       LOS: 7 days A FACE TO FACE EVALUATION WAS PERFORMED  Ranelle Oyster 03/24/2021, 9:28 AM

## 2021-03-24 NOTE — Progress Notes (Signed)
Physical Therapy Weekly Progress Note  Patient Details  Name: Joseph Ortega MRN: [id number] Date of Birth: 20-Apr-1955  Beginning of progress report period: March 18, 2021 End of progress report period: March 24, 2021  Today's Date: 03/24/2021 PT Individual Time: 7290-2111 PT Individual Time Calculation (min): 58 min   Patient has met 3 of 4 short term goals.  Pt is progressing very well toward mobility goals, improving independence with bed mobility, balance, transfers, and ambulation. Pt has demonstrated improving NM activation of L hemibody with associated improvements in mobility. Pt's level of assistance required for sit to stand fluctuates depending on pain in knees and effectiveness of sequencing, body mechanics and coordination, requiring anywhere from maxA to as little as minA. Pt will benefit from family ed prior to DC.  Patient continues to demonstrate the following deficits muscle weakness, decreased cardiorespiratoy endurance, decreased coordination and decreased motor planning, decreased attention to left, and decreased sitting balance, decreased standing balance, hemiplegia, and decreased balance strategies and therefore will continue to benefit from skilled PT intervention to increase functional independence with mobility.  Patient progressing toward long term goals..  Continue plan of care.  PT Short Term Goals Week 1:  PT Short Term Goal 1 (Week 1): Pt will perform be mobility with modA. PT Short Term Goal 1 - Progress (Week 1): Met PT Short Term Goal 2 (Week 1): Pt will perform sit to stand with maxA +1. PT Short Term Goal 2 - Progress (Week 1): Met PT Short Term Goal 3 (Week 1): Pt will perform bed to chair transfer with modA +1 consistently. PT Short Term Goal 3 - Progress (Week 1): Progressing toward goal PT Short Term Goal 4 (Week 1): Pt will ambulate x25' with modA +2 and LRAD. PT Short Term Goal 4 - Progress (Week 1): Met Week 2:  PT Short Term Goal 1 (Week 2): Pt  will perform bed mobility consistently with minA. PT Short Term Goal 2 (Week 2): Pt will perform sit to stand consistently with minA. PT Short Term Goal 3 (Week 2): Pt will perform bed to chair consistently with minA. PT Short Term Goal 4 (Week 2): Pt will ambulate x50' with modA +1 and LRAD.  Skilled Therapeutic Interventions/Progress Updates:  Ambulation/gait training;Community reintegration;DME/adaptive equipment instruction;Neuromuscular re-education;Psychosocial support;Stair training;UE/LE Strength taining/ROM;Wheelchair propulsion/positioning;Balance/vestibular training;Discharge planning;Functional electrical stimulation;Pain management;Skin care/wound management;Therapeutic Activities;UE/LE Coordination activities;Cognitive remediation/compensation;Disease management/prevention;Patient/family education;Functional mobility training;Splinting/orthotics;Therapeutic Exercise;Visual/perceptual remediation/compensation   Pt received supine in bed and agrees to therapy. Reports mild pain in both knees. Number not provided. PT provides rest breaks as needed and mobility to manage pain. Pt perform supine to sit with bed features and cues for sequencing and positioning. Pt attempts stand pivot transfer from edge of bed but does not achieve full standing posture due to poor sequencing and lack of hip extension. PT raises bed and pt attempts again and is able to complete with maxA and cues for sequencing and initiation. WC transport to gym for time management. Pt performs sit to stand with R hand rail in hallway multiple reps during session, initially with modA and improving to light minA. Initially pt performs lateral weight shifting with mirror positioned for visual feedback. Pt progresses to perform forward and backward steps with R leg to promote L lateral weight shifting. Following seated rest break, pt begins ambulation trials. Pt ambulates x30' with modA +1 and +2 for WC follow, with R hand rail. PT  provides step-by-step cueing for lateral weight shifting, blocking of L knee, progression of reciprocal  pattern, and ensuring neutral posture. Pt completes x2 more bouts of 30' with hand rail, then attempts with bari RW, and is able to complete with heavy modA and manual assistance to maintain L grip on handle, x30'. WC transport back to room. Stand step transfer back to bed with heavy modA and no AD. Sit to supine with modA management of bilateral lower extremities. Left supine with alarm intact and all needs within reach.  Therapy Documentation Precautions:  Precautions Precautions: Fall Precaution Comments: Lt hemi with Lt sided edema Restrictions Weight Bearing Restrictions: No   Therapy/Group: Individual Therapy  Breck Coons, PT, DPT 03/24/2021, 4:38 PM

## 2021-03-24 NOTE — Progress Notes (Signed)
Speech Language Pathology Daily Session Note  Patient Details  Name: Mizraim Harmening MRN: [id number] Date of Birth: [birth year]  Today's Date: 03/24/2021 SLP Individual Time: 0830-0900 SLP Individual Time Calculation (min): 30 min  Short Term Goals: Week 1: SLP Short Term Goal 1 (Week 1): Patient will demonstrate functional problem solving during complex tasks with Mod I. SLP Short Term Goal 2 (Week 1): Patient will recall new daily and functional information with Mod I. SLP Short Term Goal 3 (Week 1): Patient will self-monitor and correct errors during functional tasks with Mod I.  Skilled Therapeutic Interventions: Skilled treatment session focused on cognitive goals. Patient requested to sit EOB and required Min A. Patient independently recalled the functions of his current medications and organized a BID pill box with Mod I. Patient agreeable to utilizing a pill box to maximize safety with medications at home. Patient left supine in bed with alarm on and all needs within reach. Continue with current plan of care.      Pain No/Denies Pain   Therapy/Group: Individual Therapy  Meeka Cartelli 03/24/2021, 3:29 PM

## 2021-03-25 LAB — GLUCOSE, CAPILLARY
Glucose-Capillary: 106 mg/dL — ABNORMAL HIGH (ref 70–99)
Glucose-Capillary: 171 mg/dL — ABNORMAL HIGH (ref 70–99)
Glucose-Capillary: 180 mg/dL — ABNORMAL HIGH (ref 70–99)
Glucose-Capillary: 191 mg/dL — ABNORMAL HIGH (ref 70–99)

## 2021-03-25 NOTE — Progress Notes (Signed)
Occupational Therapy Session Note  Patient Details  Name: Joseph Ortega MRN: [id number] Date of Birth: [birth year]  Today's Date: 03/25/2021 OT Individual Time: [id number] OT Individual Time Calculation (min): 62 min   Short Term Goals: Week 1:  OT Short Term Goal 1 (Week 1): Pt will complete BSC transfer with +2 assist to progress with OOB toileting OT Short Term Goal 2 (Week 1): Pt will complete sit<stand in [place] with no more than Mod A during 2 consecutive ADL sessions OT Short Term Goal 3 (Week 1): Pt will assist with 1/3 components of toileting with no more than Mod balance assist  Skilled Therapeutic Interventions/Progress Updates:    Pt greeted in bed, premedicated with pain patch for the Rt knee. He was agreeable to extra therapy time. Min A for supine<sit with HOB elevated, using the bedrail. While EOB, guided pt through Lt UE ROM exercises using the UE ranger. Pt participating in exercises until reaching the point of fatigue for NMR, min facilitation for full elbow extension though this improved with repetition and cues. Transitioned to mirror therapy using full length mirror, focusing on creating neuroplastic changes while moving the Rt UE. Issued pt a leg lifter for the Lt LE and encouraged working on passive/active assist ankle ROM to help with edema control and to promote functional return. Pt with high levels of participation throughout session, required minimal rest breaks. He returned to bed and worked on core activation via bridging while adjusting chuck pad. Lt side elevated with pillows for edema control. He was left with all needs within reach and bed alarm set. Tx focus placed on NMR to increase functional return of Lt UE/LE and also trunk control.   Therapy Documentation Precautions:  Precautions Precautions: Fall Precaution Comments: Lt hemi with Lt sided edema Restrictions Weight Bearing Restrictions: No Vital Signs: Therapy Vitals Temp: 97.8 F (36.6 C) Pulse Rate:  73 Resp: 18 BP: (!) 151/70 Patient Position (if appropriate): Lying Oxygen Therapy SpO2: 95 % O2 Device: Room Air Pain:   ADL: ADL Eating: Set up (per most recent staff documentation) Grooming: Moderate assistance Where Assessed-Grooming: Edge of bed Upper Body Bathing: Moderate assistance Where Assessed-Upper Body Bathing: Edge of bed Lower Body Bathing: Maximal assistance Where Assessed-Lower Body Bathing: Edge of bed Upper Body Dressing: Moderate assistance Where Assessed-Upper Body Dressing: Edge of bed Lower Body Dressing: Dependent Where Assessed-Lower Body Dressing: Edge of bed Toileting: Not assessed Toilet Transfer: Not assessed Tub/Shower Transfer: Not assessed Therapy/Group: Individual Therapy  Joseph Ortega 03/25/2021, 3:57 PM

## 2021-03-25 NOTE — Progress Notes (Signed)
Occupational Therapy Session Note  Patient Details  Name: Joseph Ortega MRN: [id number] Date of Birth: [birth year]  Today's Date: 03/26/2021 OT Individual Time: [id number] and [id number] OT Individual Time Calculation (min): 73 min and 32 min   Short Term Goals: Week 1:  OT Short Term Goal 1 (Week 1): Pt will complete BSC transfer with +2 assist to progress with OOB toileting OT Short Term Goal 2 (Week 1): Pt will complete sit<stand in [place] with no more than Mod A during 2 consecutive ADL sessions OT Short Term Goal 3 (Week 1): Pt will assist with 1/3 components of toileting with no more than Mod balance assist  Skilled Therapeutic Interventions/Progress Updates:    Pt greeted while sitting up in the w/c, declining shower however asking to start session by using the urinal. Mod A for sit<stand in front of the sink and OT assisted with clothing. Pt able to set up the urinal to void bladder, Mod-Max balance assist while he pulled up underwear and pants up on the Rt side after. Pt was then escorted via w/c to the dayroom. Worked on sit<stands at high low table, pt requiring Mod-Max A for power up with Lt side supported. Worked on Lt>Rt weight shifting, pt actively weightbearing through the Lt UE and using affecting limb to push himself back to midline. Mirror also used for visual feedback in regards to postural alignment. Transitioned to forward/backward stepping with UEs supported on table, pt requiring Mod A to advance the Lt LE. Note that his left knee had substantial buckling x2 but for a majority of the session pt required just Min A for balance support (including Lt knee during weight shifts). While seated, worked on backward propulsion in the w/c, pt requiring Mod-Max A for Lt LE positioning to optimize weightbearing/strengthening. He was then returned to the room via w/c. Left him sitting up with all needs within reach and half lap tray applied to the w/c.   E-stim applied to anterior and posterior  deltoids for 40 minutes of Lt UE weightbearing activity today. Skin intact once pads were removed. Parameters are stated below:  Saebo Stim One 330 pulse width 35 Hz pulse rate On 8 sec/ off 8 sec Ramp up/ down 2 sec Symmetrical Biphasic wave form  Max intensity 149mA at 500 Ohm load  2nd Session 1:1 tx (32 min) Pt greeted in the w/c and agreeable to session. Applied Saebo Stim One to wrist extensors and pt tolerated estim for ~25 minutes while engaged in a graded grasp/release activity to promote functional return of the affected Lt UE. Mod facilitation from OT while transferring items Lt>Rt of table. Pt required 2 rest breaks due to arm fatigue. He then requested to return to bed. Mod A for sit<stand in [place], pt needing 2 attempts due to level of fatigue. He returned to bed with Mod A and was assisted with hemiplegic positioning. Pt left in care of NT for obtaining vitals.   Skin intact once pads were removed. E-stim parameters are listed below: Saebo Stim One 330 pulse width 35 Hz pulse rate On 8 sec/ off 8 sec Ramp up/ down 2 sec Symmetrical Biphasic wave form  Max intensity 174mA at 500 Ohm load   Therapy Documentation Precautions:  Precautions Precautions: Fall Precaution Comments: Lt hemi with Lt sided edema Restrictions Weight Bearing Restrictions: No ADL: ADL Eating: Set up (per most recent staff documentation) Grooming: Moderate assistance Where Assessed-Grooming: Edge of bed Upper Body Bathing: Moderate assistance Where Assessed-Upper Body Bathing: Edge  of bed Lower Body Bathing: Maximal assistance Where Assessed-Lower Body Bathing: Edge of bed Upper Body Dressing: Moderate assistance Where Assessed-Upper Body Dressing: Edge of bed Lower Body Dressing: Dependent Where Assessed-Lower Body Dressing: Edge of bed Toileting: Not assessed Toilet Transfer: Not assessed Tub/Shower Transfer: Not assessed Therapy/Group: Individual Therapy  Corlette Ciano A Sydnie Sigmund 03/26/2021,  1:07 PM

## 2021-03-26 LAB — GLUCOSE, CAPILLARY
Glucose-Capillary: 221 mg/dL — ABNORMAL HIGH (ref 70–99)
Glucose-Capillary: 155 mg/dL — ABNORMAL HIGH (ref 70–99)
Glucose-Capillary: 189 mg/dL — ABNORMAL HIGH (ref 70–99)

## 2021-03-26 MED ORDER — CARVEDILOL 12.5 MG PO TABS
12.5000 mg | ORAL_TABLET | Freq: Two times a day (BID) | ORAL | Status: DC
  Administered 2021-03-26 – 2021-04-08 (×26): 12.5 mg via ORAL
  Filled 2021-03-26 (×26): qty 1

## 2021-03-26 NOTE — Progress Notes (Signed)
PROGRESS NOTE   Subjective/Complaints:  Pt reports L hand feels bette-r less swollen.  Used to take Lasix at home for LE edema.   EOB with PT Wearing TEDs and swelling getting worse.   ROS:  Pt denies SOB, abd pain, CP, N/V/C/D, and vision changes    Objective:   No results found. Recent Labs    03/24/21 0503  WBC 6.8  HGB 12.6*  HCT 36.7*  PLT 198    Recent Labs    03/24/21 0503  NA 137  K 3.8  CL 103  CO2 29  GLUCOSE 102*  BUN 16  CREATININE 0.66  CALCIUM 8.3*     Intake/Output Summary (Last 24 hours) at 03/26/2021 1112 Last data filed at 03/26/2021 0700 Gross per 24 hour  Intake 720 ml  Output 400 ml  Net 320 ml        Physical Exam: Vital Signs Blood pressure (!) 173/81, pulse 60, temperature 98.1 F (36.7 C), resp. rate 14, height 6' 0.99" (1.854 m), weight 129 kg, SpO2 98 %.    General: awake, alert, appropriate, sitting EOB with PT;  NAD HENT: conjugate gaze; oropharynx moist CV: regular rate; no JVD Pulmonary: CTA B/L; no W/R/R- good air movement GI: soft, NT, ND, (+)BS Psychiatric: appropriate Neurological: alert-  MS: L hand mild swelling- with ktape in place  GI/Abdomen: BS +, non-tender, non-distended Ext: no clubbing, cyanosis, or edema Psych: pleasant and cooperative  Musculoskeletal: left hand swelling, k-tape    Right lower leg: Edema present.     Left lower leg: Edema present. 3+ LE edema B/L   Neurological:     Mental Status: He is alert.     Comments: Alert and oriented x 3. Normal insight and awareness. Intact Memory. Normal language and speech. Cranial nerve exam unremarkable except for LLQ quadrantanopsia persists. LUE 2- to 2/5 pec, biceps and 1+/5 wrist and finger flexion. Trace to absent in extension. LLE 2+/5 HE, KE and 1+/5 PF, 0-tr/5 DF. RUE and RLE grossly 5/5. No focal sensory abnl  Sensation intact to LT BUE and BLE      Assessment/Plan: 1. Functional  deficits which require 3+ hours per day of interdisciplinary therapy in a comprehensive inpatient rehab setting. Physiatrist is providing close team supervision and 24 hour management of active medical problems listed below. Physiatrist and rehab team continue to assess barriers to discharge/monitor patient progress toward functional and medical goals  Care Tool:  Bathing    Body parts bathed by patient: Right arm, Left arm, Chest, Abdomen   Body parts bathed by helper: Right arm, Left arm, Buttocks, Right lower leg, Left lower leg, Left upper leg     Bathing assist Assist Level: Minimal Assistance - Patient > 75%     Upper Body Dressing/Undressing Upper body dressing   What is the patient wearing?: Pull over shirt    Upper body assist Assist Level: Minimal Assistance - Patient > 75%    Lower Body Dressing/Undressing Lower body dressing      What is the patient wearing?: Pants, Underwear/pull up     Lower body assist Assist for lower body dressing: Dependent - Patient 0%  Toileting Toileting Toileting Activity did not occur Press photographer and hygiene only): N/A (no void or bm)  Toileting assist Assist for toileting: Total Assistance - Patient < 25%     Transfers Chair/bed transfer  Transfers assist     Chair/bed transfer assist level: Total Assistance - Patient < 25%     Locomotion Ambulation   Ambulation assist   Ambulation activity did not occur: Safety/medical concerns  Assist level: Maximal Assistance - Patient 25 - 49% Assistive device: Parallel bars Max distance: 2 ft   Walk 10 feet activity   Assist  Walk 10 feet activity did not occur: Safety/medical concerns        Walk 50 feet activity   Assist Walk 50 feet with 2 turns activity did not occur: Safety/medical concerns         Walk 150 feet activity   Assist Walk 150 feet activity did not occur: Safety/medical concerns         Walk 10 feet on uneven surface   activity   Assist Walk 10 feet on uneven surfaces activity did not occur: Safety/medical concerns         Wheelchair     Assist Is the patient using a wheelchair?: Yes Type of Wheelchair: Manual    Wheelchair assist level: Dependent - Patient 0% Max wheelchair distance: 150'    Wheelchair 50 feet with 2 turns activity    Assist        Assist Level: Dependent - Patient 0%   Wheelchair 150 feet activity     Assist      Assist Level: Dependent - Patient 0%   Blood pressure (!) 173/81, pulse 60, temperature 98.1 F (36.7 C), resp. rate 14, height 6' 0.99" (1.854 m), weight 129 kg, SpO2 98 %.    Medical Problem List and Plan: 1. 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 ?embolic. 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             -30 day outpatient cardiac event monitor recommended to r/o a fib              -patient may  shower             -ELOS/Goals: 10-14 days, supervision to min assist goals with PT and OT  Con't CIR- PT and OT 2.  Antithrombotics: -DVT/anticoagulation:  Mechanical: Sequential compression devices, below knee Bilateral lower extremities Pharmaceutical: Lovenox             -antiplatelet therapy: aspirin 81 mg 3. Pain Management: Tylenol 4. Mood: LCSW to evaluate and provide emotional support             -antipsychotic agents: n/a 5. Neuropsych: This patient is capable of making decisions on his own behalf. 6. Skin/Wound Care: routine skin checks.  --Monitor BLE ulcerations. 7. Fluids/Electrolytes/Nutrition: eating well  -protein supp for hypoalbuminemia  -I personally reviewed the patient's labs today.   8. Hypertension: continue valsartan and coreg.    -diet discussed 1/6 persistent HTN: sl increased coreg to 9.375mg  bid on 1/5--observe for pattern today 1/8-will increase- BP still  150s-170s systolic- will increase to 12.5 mg BID  9. 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 Hgb A1c = 11.1             -monitor cbg's, adjust regimen as needed  Carb modified diet.   CBG (last 3)  Recent  Labs    03/25/21 1202 03/25/21 1626 03/25/21 2220  GLUCAP 171* 180* 191*        1/6 levimir  increased to 39u bid 1/5--improved so far. observe today  1/8- Bgs a little elevated, but give 1 more day and then can titrate 10: Vitamin D deficiency: started on 1,000mg  daily supplement.  11: Hypothyroidism: continue Synthroid and recheck labs in 6 weeks 12: Peripheral neuropathy: continue gabapentin 300 mg twice daily at home 13: Herpes zoster of chest: continue Valtrex for total of 2-3 weeks 14: OSA: home BiPAP, 2L O2  at night and naps 15: History of depression: Cymbalta listed on home meds 16: Tobacco use: Offer nicotine patch and provide cessation counseling 17: EtOH use:   Magnesium level reviewed and was 2.2 on 12/21.  Counseling as appropriate 18: Obesity BMI 37.53: Dietary consultation. Provided list of foods that can help with weight loss.  19: Venous insufficiency, bilateral lower extremities: Has had treatment for venous stasis ulcers including Unna boots and follow-up at [facility] wound care center.  Currently with small superficial ulcerations bilateral lower legs.  He routinely wears compression stockings. Legs are stable  1/8- Pt reports was on Lasix 20 mg daily- asking if can be restart?       LOS: 9 days A FACE TO FACE EVALUATION WAS PERFORMED  Kerri-Anne Haeberle 03/26/2021, 11:12 AM

## 2021-03-26 NOTE — Progress Notes (Addendum)
Occupational Therapy Weekly Progress Note  Patient Details  Name: Joseph Ortega MRN: [id number] Date of Birth: [birth year]  Beginning of progress report period: March 18, 2021 End of progress report period: March 27, 2021  Today's Date: 03/27/2021 OT Individual Time: 7106-2694 OT Individual Time Calculation (min): 58 min    Patient has met 3 of 3 short term goals.  Joseph Ortega is making steady progress towards OT goals. Pt has demonstrated improved sit<>stand in [place] with mostly moderate assistance during most BADL sessions. He occasionally still requires max A from lower surfaces. Patient has also demonstrated improved standing balance/endurance within functional BADL tasks. Joseph Ortega L UE function continues to improve with increased scapula, shoulder, and elbow activation. Today pt was able to flex finger voluntarily! Hand at [place] Stage 2, and arm at [place] Stage 2-3. Patient is working very hard in therapy and is also tolerating SAEBO e-stim focused on distal muscle activation. Continue current POC.  Patient continues to demonstrate the following deficits: muscle weakness, decreased cardiorespiratoy endurance, impaired timing and sequencing, abnormal tone, unbalanced muscle activation, motor apraxia, decreased coordination, and decreased motor planning, decreased attention to left and left side neglect, decreased attention, and decreased sitting balance, decreased standing balance, decreased postural control, hemiplegia, and decreased balance strategies and therefore will continue to benefit from skilled OT intervention to enhance overall performance with BADL and Reduce care partner burden.  Patient progressing toward long term goals..  Continue plan of care.  OT Short Term Goals Week 1:  OT Short Term Goal 1 (Week 1): Pt will complete BSC transfer with +2 assist to progress with OOB toileting OT Short Term Goal 1 - Progress (Week 1): Met OT Short Term Goal 2 (Week 1): Pt will  complete sit<stand in [place] with no more than Mod A during 2 consecutive ADL sessions OT Short Term Goal 2 - Progress (Week 1): Met OT Short Term Goal 3 (Week 1): Pt will assist with 1/3 components of toileting with no more than Mod balance assist OT Short Term Goal 3 - Progress (Week 1): Met Week 2:  OT Short Term Goal 1 (Week 2): Patient will use L UE as a stabilizer within grooming task with min questioning cues OT Short Term Goal 2 (Week 2): Patient will perform sit<>stand at the sink in preparation for BADL task with max A of 1 (without STedy)  Skilled Therapeutic Interventions/Progress Updates:    Pt greeted seated EOB and agreeable to OT treatment session. Pt reported need to urinate. Pt requested to stand EOB with bed raised and mod A. Pt was then able to maintain standing balance with mod A and no UE support while placing urinal for continent void. Pt then sat to rest before completing squat-pivot to wc with mod A to the stronger R side. Addressed wc propulsion using hemi-technique. Pt brought to therapy laundry room and worked on sit<>stand and standing balance to load top loader washer with mod A to get to standing, then min A for balance reaching to load washer. Pt then brought to therapy gym for time management and OT placed kinesiotape on L hand for L attention and edema management. L UE NMR with joint input through wrist and hand to bring L UE through full ROM. OT felt trance activation through hand and fingers. OT had pt try to isolate finger flexion and he was able to flex and extend fingers today! OT provided pt with soft foam to continue working on grasp. PT left seated in wc with  alarm belt on, call bell in reach, and needs met.  OT placed SAEBO e-stim on shoulder. SAEBO left on for 60 minutes. OT returned to remove SAEBO with skin intact and no adverse reactions.  Saebo Stim One 330 pulse width 35 Hz pulse rate On 8 sec/ off 8 sec Ramp up/ down 2 sec Symmetrical Biphasic wave  form  Max intensity 125m at 500 Ohm load   Therapy Documentation Precautions:  Precautions Precautions: Fall Precaution Comments: Lt hemi with Lt sided edema Restrictions Weight Bearing Restrictions: No Pain:  Denies pain Therapy/Group: Individual Therapy  EValma Cava1/10/2021, 11:32 PM

## 2021-03-26 NOTE — Progress Notes (Signed)
Physical Therapy Session Note  Patient Details  Name: Joseph Ortega MRN: [id number] Date of Birth: 1955-08-06  Today's Date: 03/26/2021 PT Individual Time: 0800-0905 PT Individual Time Calculation (min): 65 min   Short Term Goals: Week 2:  PT Short Term Goal 1 (Week 2): Pt will perform bed mobility consistently with minA. PT Short Term Goal 2 (Week 2): Pt will perform sit to stand consistently with minA. PT Short Term Goal 3 (Week 2): Pt will perform bed to chair consistently with minA. PT Short Term Goal 4 (Week 2): Pt will ambulate x50' with modA +1 and LRAD.  Skilled Therapeutic Interventions/Progress Updates:     Patient in bed upon PT arrival. Patient alert and agreeable to PT session. Patient denied pain during session.  Noted significant B lower extremity edema, patient reports this is baseline for him. Patient asked MD about medication management during rounding. PT donned B knee high TEDs with total A prior to mobility. Patient was incontinent of bladder at beginning of session, patient with no recollection of this, however, suspects he spilled the urinal when last voiding.   Therapeutic Activity: Bed Mobility: Patient performed supine to/from sit with min-mod A and increased time/effort in a flat bed without use of bed rails getting up on the L side of the bed to simulate home environment. Provided verbal cues for progression through L side-lying, setting bottom elbow to progress through propping on his elbow then push up to his hand. Transfers: Patient attempted a lateral scoot transfer, but unable to lift hips effectively to clear the chair and the w/c slid away on the floor requiring max A for patient to scoot back onto the bed for safety. Patient performed stand pivot bed>w/c holding onto the back of the recliner with B upper extremities with recliner angled for improved support with turn and RN +2 to stabilize w/c for safety.   Neuromuscular Re-ed: Patient performed the  following sitting and standing balance activities for improved postural and motor control with functional activities: -reciprocal scooting to EOB x4 with mod A for L scooting and cues for head-hips relationship -sitting balance EOB >6 min progressing from supervision with slight L lean to independent in midline with static sitting -dynamic sitting balance >15 min during functional ADLs for increased righting reactions and L hemi-body motor control: -doffed t-shirt independently using hemi-technique, donned t-shirt with notable apraxia with min cues to correct with dual task challenge, as patient was speaking with MD while attempting to don his shirt initially, able to don correcting when focused on task fully  -performed peri-care and lower body bathing with min A for ankles and feet with supervision for dynamic balance challenge  -donned underwear and paper scrub pants with min A for initiating threading his feet through  -donned tennis shoes with mod A due to increased challenge with forward bend due to decreased strength and body habitus -sit to stand x3 with mod progressing to min A from slightly elevated bed height using the back of the recliner to pull up, PT blocking L knee without signs of buckling and hand over hand assist for L hand use for B upper extremity support and increased neuro-feedback to L hand with an automatic task -standing balance using recliner, as above progressing from min A to CGA 3x60-90 sec focused on L gluteal and quad activation, L upper extremity grip and triceps activation for increased support, and erect posture in midline with multimodal cues; with patient in standing PT performed peri-care for thoroughness and pulled  up underwear and pants with total A  Patient in w/c at end of session with L lap tray obtained and placed during session for edema management and shoulder approximation with breaks locked, seat belt alarm set, and all needs within reach.   Therapy  Documentation Precautions:  Precautions Precautions: Fall Precaution Comments: Lt hemi with Lt sided edema Restrictions Weight Bearing Restrictions: No    Therapy/Group: Individual Therapy  Lochlyn Zullo L Djibril Glogowski PT, DPT  03/26/2021, 8:32 PM

## 2021-03-27 DIAGNOSIS — R600 Localized edema: Secondary | ICD-10-CM

## 2021-03-27 LAB — GLUCOSE, CAPILLARY
Glucose-Capillary: 159 mg/dL — ABNORMAL HIGH (ref 70–99)
Glucose-Capillary: 189 mg/dL — ABNORMAL HIGH (ref 70–99)
Glucose-Capillary: 127 mg/dL — ABNORMAL HIGH (ref 70–99)
Glucose-Capillary: 184 mg/dL — ABNORMAL HIGH (ref 70–99)

## 2021-03-27 MED ORDER — FUROSEMIDE 20 MG PO TABS
20.0000 mg | ORAL_TABLET | Freq: Every day | ORAL | Status: DC
  Administered 2021-03-27 – 2021-04-11 (×16): 20 mg via ORAL
  Filled 2021-03-27 (×16): qty 1

## 2021-03-27 MED ORDER — INSULIN DETEMIR 100 UNIT/ML ~~LOC~~ SOLN
39.0000 [IU] | Freq: Two times a day (BID) | SUBCUTANEOUS | Status: DC
  Administered 2021-03-27 – 2021-04-18 (×42): 39 [IU] via SUBCUTANEOUS
  Filled 2021-03-27 (×48): qty 0.39

## 2021-03-27 NOTE — Consult Note (Signed)
Neuropsychological Consultation   Patient:   Joseph Ortega   DOB:   02-27-1956  MR Number:  [id number]  Location:  [place] [facility] [place] [id number] [place] [address] Dept: [place]: [phone]           Date of Service:   03/27/2021  Start Time:   8 AM End Time:   9 AM  Provider/Observer:  Ilean Skill, Psy.D.       Clinical Neuropsychologist       Billing Code/Service: [phone]  Chief Complaint:    Joseph Ortega is a 66 year old male with a past medical history including obesity, essential hypertension, history of tobacco use, hypothyroidism, obstructive sleep apnea, depression and type 2 diabetes.  Patient is also the primary caregiver for his wife who is disabled after extensive hospitalization.  Patient presented on 03/08/2021 to [facility] ED complaining of left arm and left leg weakness.  Code stroke initiated.  Patient transferred to [facility] where the rash was noted and concerning for viral infection.  Started on antivirals.  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  Repeat CT scan on 12/25 indicated his status and remained stable.  Patient has continued on his home BiPAP due to history of obstructive sleep apnea and Keppra continued.  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  Reason for Service:  Patient was referred for neuropsychological consultation due to coping and adjustment issues with residual left-sided motor deficits and vision deficits in left visual field with prior history of depression.  Below is HPI for the current admission.  HPI: Joseph Ortega is a 66 year old right-handed male who was in his  usual state of health until March 08, 2021 when he presented to [facility] emergency department complaining of left arm and leg weakness.  Code stroke was initiated.  Neurology was consulted.  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  He was transferred to [facility].  He was noted to have a left anterior chest rash consistent with herpes zoster.  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  He was admitted to the neuro ICU and placed on Cleviprex and Keppra.  Neurosurgery was consulted.  On 12/22 he underwent four-vessel cerebral arteriogram by Dr. Estanislado Pandy.  There was no evidence of DAVF, AV shunting, AVM, aneurysms or of dissection.  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  He was hemodynamically stable. He underwent speech language pathology evaluation and was started on regular diet.  A repeat CT of the head on 12/25 was stable and aspirin therapy was initiated.  He underwent bilateral lower extremity venous Doppler study on 12/25 negative for DVT.  He underwent CTA of the neck the following day and was unchanged from prior.  Left upper quadrantanopia improved; lower quadrantanopia persisted.  Blood pressure remained controlled on valsartan and metoprolol.  He was continued on his home BiPAP with history of obstructive sleep apnea.  Keppra continues.  Patient will need 30-day event monitor.  Diabetes coordinator was consulted for assistance with blood glucoses and insulin dosing. Patient was overall requiring mod physical assist for bed mobility and +2 for transfer. 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  Current  Status:  Patient was awake and alert as I entered the room sitting up in his bed having just met with Dr. Naaman Plummer for morning rounds.  Patient with alert and bright affect and good mental status and cognition.  Patient describes all of his noted changes including left motor deficits for his left arm and leg and visual deficits in the left visual field.  Patient denied an exacerbation of his depression symptoms but reports that he has been quite worried about his recovery as he is the primary caregiver for his wife who is disabled.  Patient reports that he has been understanding and actively participating in therapeutic interventions as he wants to get maximal recovery so he can return and care for his wife.  At this point, discharge is planned for 1/28.  Behavioral Observation: Terell Kincy  presents as a 66 y.o.-year-old Right handed Caucasian Male who appeared his stated age. his dress was Appropriate and he was Well Groomed and his manners were Appropriate to the situation.  his participation was indicative of Appropriate behaviors.  There were physical disabilities noted.  he displayed an appropriate level of cooperation and motivation.     Interactions:    Active Appropriate  Attention:   within normal limits and attention span and concentration were age appropriate  Memory:   within normal limits; recent and remote memory intact  Visuo-spatial:  abnormal primary vision changes with occipital lobe infarction  Speech (Volume):  normal  Speech:   normal; normal  Thought Process:  Coherent and Relevant  Though Content:  WNL; not suicidal and not homicidal  Orientation:   person, place, time/date, and situation  Judgment:   Good  Planning:   Good  Affect:    Appropriate  Mood:    Dysphoric  Insight:   Good  Intelligence:   normal   Medical History:   Past Medical History:  Diagnosis Date   Diabetes mellitus without complication ([place])    Hypertension    Thyroid disease           Patient Active Problem List   Diagnosis Date Noted   ICH (intracerebral hemorrhage) ([place]) 03/17/2021   Hypothyroidism 03/12/2021   OSA (obstructive sleep apnea) 03/12/2021   Depression 03/12/2021   Uncontrolled type 2 diabetes mellitus with hyperglycemia ([place]) 03/12/2021   Redness of both eyes 03/12/2021   Morbid obesity ([place]) 03/11/2021   Essential hypertension 03/11/2021   Tobacco abuse 03/11/2021   Herpes zoster with complication    SAH (subarachnoid hemorrhage) ([place]) 03/08/2021    Psychiatric History:  Patient without prior extended history of depression that had depressive response/reaction with his stroke and concern about his ability to return his primary caregiver for his wife.  Family Med/Psych History: History reviewed. No pertinent family history.  Impression/DX:  Salvador Coupe is a 66 year old male with a past medical history including obesity, essential hypertension, history of tobacco use, hypothyroidism, obstructive sleep apnea, depression and type 2 diabetes.  Patient is also the primary caregiver for his wife who is disabled after extensive hospitalization.  Patient presented on 03/08/2021 to [facility] ED complaining of left arm and left leg weakness.  Code stroke initiated.  Patient transferred to [facility] where the rash was noted and concerning for viral infection.  Started on antivirals.  MRI on 12/23 revealed  right occipital lobe infarction and enlargement of posterior frontal infarction.  Repeat CT scan on 12/25 indicated his status and remained stable.  Patient has continued on his home BiPAP due to history of obstructive sleep apnea and Keppra continued.  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  Patient was awake and alert as I entered the  room sitting up in his bed having just met with Dr. Naaman Plummer for morning rounds.  Patient with alert and bright affect and good mental status and cognition.  Patient describes all of his noted changes including left motor deficits for his left arm and leg and visual deficits in the left visual field.  Patient denied an exacerbation of his depression symptoms but reports that he has been quite worried about his recovery as he is the primary caregiver for his wife who is disabled.  Patient reports that he has been understanding and actively participating in therapeutic interventions as he wants to get maximal recovery so he can return and care for his wife.  At this point, discharge is planned for 1/28.  Disposition/Plan:  Today we worked on coping and adjustment issues and developed some strategies.  Also worked on understanding about his progress and what to expect going forward and how to manage some of his concerns and worries.  Patient is aware of significant improvement in overall function and encouraged by his progress.  Diagnosis:    Right occipital and posterior right frontal infarctions         Electronically Signed   _______________________ Ilean Skill, Psy.D. Clinical Neuropsychologist

## 2021-03-27 NOTE — Progress Notes (Addendum)
PROGRESS NOTE   Subjective/Complaints:  No new issues this morning. Had a quiet weekend. No pain. Sleeping well  ROS: Patient denies fever, rash, sore throat, blurred vision, nausea, vomiting, diarrhea, cough, shortness of breath or chest pain, joint or back pain, headache, or mood change.     Objective:   No results found. No results for input(s): WBC, HGB, HCT, PLT in the last 72 hours.   No results for input(s): NA, K, CL, CO2, GLUCOSE, BUN, CREATININE, CALCIUM in the last 72 hours.    Intake/Output Summary (Last 24 hours) at 03/27/2021 0908 Last data filed at 03/27/2021 0744 Gross per 24 hour  Intake 598 ml  Output 575 ml  Net 23 ml        Physical Exam: Vital Signs Blood pressure (!) 157/82, pulse 64, temperature (!) 97.4 F (36.3 C), resp. rate 18, height 6' 0.99" (1.854 m), weight 129 kg, SpO2 96 %.    Constitutional: No distress . Vital signs reviewed. HEENT: NCAT, EOMI, oral membranes moist Neck: supple Cardiovascular: RRR without murmur. No JVD    Respiratory/Chest: CTA Bilaterally without wheezes or rales. Normal effort    GI/Abdomen: BS +, non-tender, non-distended Ext: no clubbing, cyanosis,  Psych: pleasant and cooperative  Musculoskeletal: left hand swelling, k-tape    Right lower leg: Edema present.     Left lower leg: Edema present. 1-2+ LE edema B/L   Neurological:     Mental Status: He is alert.     Comments: Alert and oriented x 3. Normal insight and awareness. Intact Memory. Normal language and speech. Cranial nerve exam unremarkable except for LLQ quadrantanopsia persists. LUE 2- to 2/5 pec, biceps and 1+/5 wrist and finger flexion.--stable Trace to absent in extension. LLE 2+/5 HE, KE and 1+/5 PF, 0-tr/5 DF. RUE and RLE grossly 5/5. No focal sensory abnl  Sensation intact to LT BUE and BLE      Assessment/Plan: 1. Functional deficits which require 3+ hours per day of interdisciplinary  therapy in a comprehensive inpatient rehab setting. Physiatrist is providing close team supervision and 24 hour management of active medical problems listed below. Physiatrist and rehab team continue to assess barriers to discharge/monitor patient progress toward functional and medical goals  Care Tool:  Bathing    Body parts bathed by patient: Right arm, Left arm, Chest, Abdomen   Body parts bathed by helper: Right arm, Left arm, Buttocks, Right lower leg, Left lower leg, Left upper leg     Bathing assist Assist Level: Minimal Assistance - Patient > 75%     Upper Body Dressing/Undressing Upper body dressing   What is the patient wearing?: Pull over shirt    Upper body assist Assist Level: Minimal Assistance - Patient > 75%    Lower Body Dressing/Undressing Lower body dressing      What is the patient wearing?: Pants, Underwear/pull up     Lower body assist Assist for lower body dressing: Dependent - Patient 0%     Toileting Toileting Toileting Activity did not occur (Clothing management and hygiene only): N/A (no void or bm)  Toileting assist Assist for toileting: Total Assistance - Patient < 25%     Transfers  Chair/bed transfer  Transfers assist     Chair/bed transfer assist level: Total Assistance - Patient < 25%     Locomotion Ambulation   Ambulation assist   Ambulation activity did not occur: Safety/medical concerns  Assist level: Maximal Assistance - Patient 25 - 49% Assistive device: Parallel bars Max distance: 2 ft   Walk 10 feet activity   Assist  Walk 10 feet activity did not occur: Safety/medical concerns        Walk 50 feet activity   Assist Walk 50 feet with 2 turns activity did not occur: Safety/medical concerns         Walk 150 feet activity   Assist Walk 150 feet activity did not occur: Safety/medical concerns         Walk 10 feet on uneven surface  activity   Assist Walk 10 feet on uneven surfaces activity did  not occur: Safety/medical concerns         Wheelchair     Assist Is the patient using a wheelchair?: Yes Type of Wheelchair: Manual    Wheelchair assist level: Dependent - Patient 0% Max wheelchair distance: 150'    Wheelchair 50 feet with 2 turns activity    Assist        Assist Level: Dependent - Patient 0%   Wheelchair 150 feet activity     Assist      Assist Level: Dependent - Patient 0%   Blood pressure (!) 157/82, pulse 64, temperature (!) 97.4 F (36.3 C), resp. rate 18, height 6' 0.99" (1.854 m), weight 129 kg, SpO2 96 %.    Medical Problem List and Plan: 1. 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 ?embolic. 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             -30 day outpatient cardiac event monitor recommended to r/o a fib              -patient may  shower             -ELOS/Goals: 10-14 days, supervision to min assist goals with PT and OT  -Continue CIR therapies including PT, OT  2.  Antithrombotics: -DVT/anticoagulation:  Mechanical: Sequential compression devices, below knee Bilateral lower extremities Pharmaceutical: Lovenox             -antiplatelet therapy: aspirin 81 mg 3. Pain Management: Tylenol 4. Mood: neuropsych eval today             -antipsychotic agents: n/a 5. Neuropsych: This patient is capable of making decisions on his own behalf. 6. Skin/Wound Care: routine skin checks.  --Monitor BLE ulcerations. 7. Fluids/Electrolytes/Nutrition: eating well  -protein supp for hypoalbuminemia      8. Hypertension: continue valsartan and coreg.    -diet discussed 1/9 coreg increased to 12.5mg  --some improvement. monitor 9. 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 Hgb A1c = 11.1             -monitor cbg's, adjust regimen as needed  Carb modified diet.   CBG (last 3)   Recent Labs    03/26/21 1638 03/26/21 2116 03/27/21 0622  GLUCAP 155* 189* 159*               1/9 increase levemir to 39u bid 10: Vitamin D deficiency: started on 1,000mg  daily supplement.  11: Hypothyroidism: continue Synthroid and recheck labs in 6 weeks 12: Peripheral neuropathy: continue  gabapentin 300 mg twice daily at home 13: Herpes zoster of chest: continue Valtrex for total of 2-3 weeks 14: OSA: home BiPAP, 2L O2  at night and naps 15: History of depression: Cymbalta listed on home meds 16: Tobacco use: Offer nicotine patch and provide cessation counseling 17: EtOH use:   Magnesium level reviewed and was 2.2 on 12/21.  Counseling as appropriate 18: Obesity BMI 37.53: Dietary consultation. Provided list of foods that can help with weight loss.  19: Venous insufficiency, bilateral lower extremities: Has had treatment for venous stasis ulcers including Unna boots and follow-up at [facility] wound care center.  Currently with small superficial ulcerations bilateral lower legs.  He routinely wears compression stockings. Legs are stable  1/8- Pt reports was on Lasix 20 mg daily- asking if can be restart?   1/9 resume lasix today     LOS: 10 days A FACE TO FACE EVALUATION WAS PERFORMED  Ranelle Oyster 03/27/2021, 9:08 AM

## 2021-03-27 NOTE — Progress Notes (Signed)
Physical Therapy Session Note  Patient Details  Name: Joseph Ortega MRN: [id number] Date of Birth: [birth year]  Today's Date: 03/27/2021 PT Individual Time: 1101-1155 PT Individual Time Calculation (min): 54 min   Short Term Goals: Week 2:  PT Short Term Goal 1 (Week 2): Pt will perform bed mobility consistently with minA. PT Short Term Goal 2 (Week 2): Pt will perform sit to stand consistently with minA. PT Short Term Goal 3 (Week 2): Pt will perform bed to chair consistently with minA. PT Short Term Goal 4 (Week 2): Pt will ambulate x50' with modA +1 and LRAD.  Skilled Therapeutic Interventions/Progress Updates:     Pt received seated in [facility] and agrees to therapy. Reports some pain in R knee. No number provided. PT provides mobility and rest breaks to manage pain. WC transport to gym for time management. Session focused on gait training with RW. RW utilized to provide NM feedback via WB through L upper extremity, as well as to provide stability. Pt performs multiple reps of sit to stand during session, requiring modA to maxA, depending on effectiveness of pt's anterior trunk lean, motor planning, and hip extension. Pt has several posterior LOB immediately upon standing due to posterior bias, requiring totalA to remain standing. L hand splint on RW to facilitate grip. Pt ambulates total of 70' with multiple seated rest breaks taken. Longest bout of ambulation is 28' and shortest ~10'. PT provides modA throughout and has +2 WC follow for safety. PT provides manual facilitation of L glute activation via tactile and verbal cues, as well as cueing for midline positioning, lateral weight shifting, progression of reciprocal gait pattern, maintaining adequate base of support, and ensuring L lower extremity extensor group motor activation during stance phase. WC transport back to room. Left seated with alarm intact and all needs within reach.  Therapy Documentation Precautions:  Precautions Precautions:  Fall Precaution Comments: Lt hemi with Lt sided edema Restrictions Weight Bearing Restrictions: No   Therapy/Group: Individual Therapy  Beau Fanny, PT, DPT 03/27/2021, 1:15 PM

## 2021-03-27 NOTE — Progress Notes (Signed)
Physical Therapy Session Note  Patient Details  Name: Joseph Ortega MRN: [id number] Date of Birth: Aug 06, 1955  Today's Date: 03/27/2021 PT Individual Time: 3338-3291 PT Individual Time Calculation (min): 70 min   Short Term Goals: Week 1:  PT Short Term Goal 1 (Week 1): Pt will perform be mobility with modA. PT Short Term Goal 1 - Progress (Week 1): Met PT Short Term Goal 2 (Week 1): Pt will perform sit to stand with maxA +1. PT Short Term Goal 2 - Progress (Week 1): Met PT Short Term Goal 3 (Week 1): Pt will perform bed to chair transfer with modA +1 consistently. PT Short Term Goal 3 - Progress (Week 1): Progressing toward goal PT Short Term Goal 4 (Week 1): Pt will ambulate x25' with modA +2 and LRAD. PT Short Term Goal 4 - Progress (Week 1): Met  Skilled Therapeutic Interventions/Progress Updates:    Pt seated in w/c on arrival and agreeable to therapy. Pt with no c/o pain on arrival, c/o muscle soreness with activity, rest breaks as needed. Attempted weight shifting and pregait activity at hall way rail, but pt was unable to shift onto LLE d/t fatigue from full day of therapy. Extended rest breaks d/t fatigue. Transitioned to Sit to stand at railing 3 x 3 with min A fading to facilitation for technique. Pt able to maintain static standing balance with CGA for up to 30 sec. Pt then maintained isometric knee extension while being transported to therapy gym. Pt directed in 3 bouts of exercise in modified plantigrade as follows (up to max A x 2 to stand d/t fatigue): weight shifting laterally and anterior/posterior, Push ups x 5, walking hands forward and back. Seated rest in between each activity. Pt required mod A at elbow and facilitation at triceps throughout to maintain elbow extension. Pt returned to room and requested to use urinal. +2 assist to stand, pt able to manage clothing with assist for balance and to hold urinal. Pt returned to w/c and was left with all needs in reach and alarm  active.   Therapy Documentation Precautions:  Precautions Precautions: Fall Precaution Comments: Lt hemi with Lt sided edema Restrictions Weight Bearing Restrictions: No     Therapy/Group: Individual Therapy  Mickel Fuchs 03/27/2021, 3:39 PM

## 2021-03-27 NOTE — Progress Notes (Signed)
Patient resting comfortably on BIPAP at this time. No assistance needed from RT.

## 2021-03-28 LAB — GLUCOSE, CAPILLARY
Glucose-Capillary: 150 mg/dL — ABNORMAL HIGH (ref 70–99)
Glucose-Capillary: 203 mg/dL — ABNORMAL HIGH (ref 70–99)
Glucose-Capillary: 151 mg/dL — ABNORMAL HIGH (ref 70–99)
Glucose-Capillary: 108 mg/dL — ABNORMAL HIGH (ref 70–99)

## 2021-03-28 NOTE — Progress Notes (Signed)
Physical Therapy Session Note ° °Patient Details  °Name: Joseph Ortega °MRN: [id number] °Date of Birth: 05/03/1955 ° °Today's Date: 03/28/2021 °PT Individual Time: 1000-1046 °PT Individual Time Calculation (min): 46 min  ° °Short Term Goals: °Week 1:  PT Short Term Goal 1 (Week 1): Pt will perform be mobility with modA. °PT Short Term Goal 1 - Progress (Week 1): Met °PT Short Term Goal 2 (Week 1): Pt will perform sit to stand with maxA +1. °PT Short Term Goal 2 - Progress (Week 1): Met °PT Short Term Goal 3 (Week 1): Pt will perform bed to chair transfer with modA +1 consistently. °PT Short Term Goal 3 - Progress (Week 1): Progressing toward goal °PT Short Term Goal 4 (Week 1): Pt will ambulate x25' with modA +2 and LRAD. °PT Short Term Goal 4 - Progress (Week 1): Met °Week 2:  PT Short Term Goal 1 (Week 2): Pt will perform bed mobility consistently with minA. °PT Short Term Goal 2 (Week 2): Pt will perform sit to stand consistently with minA. °PT Short Term Goal 3 (Week 2): Pt will perform bed to chair consistently with minA. °PT Short Term Goal 4 (Week 2): Pt will ambulate x50' with modA +1 and LRAD. 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 Emphasis of session on gait training and L NMR. Pt transported to main gym hallway w/total A for time management and use of guardrail for gait training.  ° °Gait training  °-Pt performed sit <>stands from WC w/RUE support w/mod A for hip extension assistance and LUE management. Pt ambulated 15', 25' and 18' w/RUE support on rail and mod A. Max tactile cues for glute activation to promote hip extension, as pt demonstrated significant forward lean during swing phase of RLE. Max verbal and tactile cues for lateral weight shifting to reduce forward lean and facilitate swing phase of contralateral limb. Noted minor knee buckling of RLE  during swing phase of LLE, maintained forward trunk lean as pt fatigued, poor step clearance bilaterally and ER of LLE. Pt reported significant fatigue following "intense" sessions day prior and stated his legs "felt like nothing". Lengthy education regarding importance of lateral weight shifting in order to advance contralateral limb and maintaining upright posture to reduce strain on lower back, pt verbalized understanding.  ° °Pt transported to room w/total a for time management and performed sit <>stand from WC to Stedy w/mod A. Pt transferred to bed via Stedy and performed stand <>sit w/min A. Sit <>supine w/CGA and pt was left supine in bed, all needs in reach.  ° °Therapy Documentation °Precautions:  °Precautions °Precautions: Fall °Precaution Comments: Lt hemi with Lt sided edema °Restrictions °Weight Bearing Restrictions: No ° ° ° °Therapy/Group: Individual Therapy °Jannah E Plaster, PT, DPT ° °03/28/2021, 7:47 AM  °

## 2021-03-28 NOTE — Patient Care Conference (Signed)
Inpatient RehabilitationTeam Conference and Plan of Care Update Date: 03/28/2021   Time: 10:34 AM    Patient Name: Joseph Ortega      Medical Record Number: [id number]  Date of Birth: [birth year] Sex: Male         Room/Bed: 4M11C/4M11C-01 Payor Info: Payor: [place] / Plan: BCBS COMM PPO / Product Type: *No Product type* /    Admit Date/Time:  03/17/2021  1:48 PM  Primary Diagnosis:  ICH (intracerebral hemorrhage) [facility])  Hospital Problems: Principal Problem:   ICH (intracerebral hemorrhage) [facility])    Expected Discharge Date: Expected Discharge Date: 04/15/21  Team Members Present: Physician leading conference: Dr. Alger Simons Social Worker Present: Loralee Pacas, [place] Nurse Present: Dorthula Nettles, RN PT Present: Estevan Ryder, PT OT Present: Cherylynn Ridges, OT SLP Present: Weston Anna, SLP PPS Coordinator present : Gunnar Fusi, SLP     Current Status/Progress Goal Weekly Team Focus  Bowel/Bladder   cont of bowel amd bladder. last BM-1/9  remain cont of B/b  assess q shift and PRN   Swallow/Nutrition/ Hydration             ADL's   Max A LB ADLs, Mod A UB ADLs, MOd/max A of 1 transfers, L UE improving, [place]strom 2-3 arm- Brunnstrom 2 hand  Min A  L UE NMR, transfters, self-care retraning, NMES, sit<>stands, L attention   Mobility   supervision supine to sit, modA sit to supine, modA sit to stand with RW, ambulation up to 30' with RW and modA  Supervision  L Hemibody NMR, ambulation, balance, improved body mechanics with sit to stand and transfers   Communication             Safety/Cognition/ Behavioral Observations  Mod I  Mod I (will likely discharge from SLP services)  complex problem solving, recall of functional information, emergent awareness   Pain   pt c/o knee pain (R). pain managment in place, effective.  pain<3  assess pain q shift and PRN   Skin   BLE -healed venous stasis  remain intact  assess skin q shift and PRN      Discharge Planning:  Pt will need to be at supervision level of care at discharge as wife has limited physical abilites (no lifting). Pt will have PRN support from church family. Pt will need to be appropriate for outpatient therapy due to challenges with obtaining HH due to insurance.   Team Discussion: Doing well. Working on BP control. Incontinent bowel. Unsure of how much assist to be available at home. Mod assist STS with steady. Hard worker, good return with left side, using SAEBO e-stim. Supervision to mod assist up to 26 ft. Working on Marine scientist and problem solving, SLP likely to discharge services this week. May need SNF for more therapy due to lack of support at home. Patient on target to meet rehab goals: Supervision to min assist goals.  *See Care Plan and progress notes for long and short-term goals.   Revisions to Treatment Plan:  Adjusting medications.  Teaching Needs: Family education, medication management, safety awareness, transfer/gait training, etc.  Current Barriers to Discharge: Decreased caregiver support, Home enviroment access/layout, Lack of/limited family support, Insurance for SNF coverage, Weight, and Medication compliance  Possible Resolutions to Barriers: Family education Discharge to SNF for continued therapy Follow-up PT/OT Order recommended DME     Medical Summary Current Status: gradual return LUE. working on bp control as well as DM. adjusting meds. still with edema in LE's  LUE.  Barriers to Discharge: Medical stability   Possible Resolutions to [employer] Focus: daily assessment of labs and pt data, med adjustments as needed   Continued Need for Acute Rehabilitation Level of Care: The patient requires daily medical management by a physician with specialized training in physical medicine and rehabilitation for the following reasons: Direction of a multidisciplinary physical rehabilitation program to maximize functional independence :  Yes Medical management of patient stability for increased activity during participation in an intensive rehabilitation regime.: Yes Analysis of laboratory values and/or radiology reports with any subsequent need for medication adjustment and/or medical intervention. : Yes   I attest that I was present, lead the team conference, and concur with the assessment and plan of the team.   Cristi Loron 03/28/2021, 3:01 PM

## 2021-03-28 NOTE — Progress Notes (Signed)
Occupational Therapy Session Note  Patient Details  Name: Joseph Ortega MRN: [id number] Date of Birth: 01-30-1956  Today's Date: 03/28/2021 OT Individual Time: 7416-3845 OT Individual Time Calculation (min): 70 min    Short Term Goals: Week 2:  OT Short Term Goal 1 (Week 2): Patient will use L UE as a stabilizer within grooming task with min questioning cues OT Short Term Goal 2 (Week 2): Patient will perform sit<>stand at the sink in preparation for BADL task with max A of 1 (without STedy)  Skilled Therapeutic Interventions/Progress Updates:    Pt greeted semi-reclined in bed and agreeable to OT treatment session. Pt reported need to urinate urgently. Pt able to manage clothing in bed, but needed OT assist to place urinal for continent void. Pt then reported need to have a BM. Pt completed bed mobility w/ HOB elevated, bed rails, and CGA. Sit<>stand in [place] from EOB with mod A to transfer onto large BSC over toilet. Pt with successful BM, then needed max A for toileting tasks. Pt transferred to [facility] in shower using Stedy in similar fashion. Mod A to integrate L UE for NMR wih OT assist to maintain grasp  on wash cloth. Pt with some active finger flexion but unable to use grasp functionally. Cut-out in commode to wash buttocks with OT assist. Worked on hemi-dressing techniques with pt needing mod A for UB and max A for LB. Sit<>stand with RW and mod A, then mod/max A for standing balance to manage clothing. Pt left seated in wc with L UE supported on 1/2 lap tray, alarm belt on, call bell in reach, and needs met.   OT placed SAEBO e-stim on wrist extensors.  SAEBO left on for 60 minutes. OT returned to remove SAEBO with skin intact and no adverse reactions.  [place] Stim One 330 pulse width 35 Hz pulse rate On 8 sec/ off 8 sec Ramp up/ down 2 sec Symmetrical Biphasic wave form  Max intensity 140m at 500 Ohm load   Therapy Documentation Precautions:  Precautions Precautions:  Fall Precaution Comments: Lt hemi with Lt sided edema Restrictions Weight Bearing Restrictions: No Pain: Denies pain    Therapy/Group: Individual Therapy  EValma Cava1/12/2021, 8:49 AM

## 2021-03-28 NOTE — Progress Notes (Signed)
RN in room and stated she will place pt on CPAP. RN stated she will call RT if needed.

## 2021-03-28 NOTE — Progress Notes (Signed)
Speech Language Pathology Discharge Summary  Patient Details  Name: Joseph Ortega MRN: [id number] Date of Birth: 03/23/1955  Today's Date: 03/28/2021 SLP Individual Time: 1300-1325 SLP Individual Time Calculation (min): 25 min   Skilled Therapeutic Interventions:  Skilled treatment session focused on cognitive goals. SLP facilitated session by providing education regarding memory compensatory strategies and strategies to utilize recall and overall safety at home. Patient verbalized understanding. The CSW also came to briefly speak to the patient about discharge planning. Patient verbalized appropriate awareness regarding needs and and potential barriers to discharge. Patient left upright in bed with alarm on and all needs within reach.   Patient has met 4 of 4 long term goals.  Patient to discharge at overall Modified Independent level.   Reasons goals not met: N/A   Clinical Impression/Discharge Summary: Patient has made excellent gains and has met 4 of 4 LTGs this admission. Currently, patient is overall Mod I for complex problem solving, anticipatory awareness and recall with use of compensatory strategies. Patient is at his baseline level of cognitive functioning, therefore, he will be discharged from skilled SLP intervention. Education is complete and f/u SLP intervention is not warranted a this time.  Patient verbalized understanding and agreement.   Care Partner:  Caregiver Able to Provide Assistance: Yes     Recommendation:  None      Equipment: N/A   Reasons for discharge: Treatment goals met   Patient/Family Agrees with Progress Made and Goals Achieved: Yes    [place], [place] 03/28/2021, 3:19 PM

## 2021-03-28 NOTE — Progress Notes (Signed)
Patient ID: Joseph Ortega, male   DOB: 18-Feb-1956, 66 y.o.   MRN: [id number]  SW met with pt in room to follow-up after team conference, and d/c date remains 1/28. SW discussed with pt while ST in room support he will have moving forward as she will continue to require physical support when he goes home. SW discussed alternative options for patient such as Chief Technology Officer and/or SNF due to limited natural supports. Pt intends to speak with his wife today, and agreeable to SW follow-up with his wife tomorrow.   Loralee Pacas, MSW, [place] Office: [phone] Cell: [phone] Fax: [phone]

## 2021-03-28 NOTE — Progress Notes (Signed)
Physical Therapy Session Note  Patient Details  Name: Joseph Ortega MRN: [id number] Date of Birth: Oct 27, 1955  Today's Date: 03/28/2021 PT Individual Time: 1415-1526 PT Individual Time Calculation (min): 71 min   Short Term Goals: Week 1:  PT Short Term Goal 1 (Week 1): Pt will perform be mobility with modA. PT Short Term Goal 1 - Progress (Week 1): Met PT Short Term Goal 2 (Week 1): Pt will perform sit to stand with maxA +1. PT Short Term Goal 2 - Progress (Week 1): Met PT Short Term Goal 3 (Week 1): Pt will perform bed to chair transfer with modA +1 consistently. PT Short Term Goal 3 - Progress (Week 1): Progressing toward goal PT Short Term Goal 4 (Week 1): Pt will ambulate x25' with modA +2 and LRAD. PT Short Term Goal 4 - Progress (Week 1): Met Week 2:  PT Short Term Goal 1 (Week 2): Pt will perform bed mobility consistently with minA. PT Short Term Goal 2 (Week 2): Pt will perform sit to stand consistently with minA. PT Short Term Goal 3 (Week 2): Pt will perform bed to chair consistently with minA. PT Short Term Goal 4 (Week 2): Pt will ambulate x50' with modA +1 and LRAD.  Skilled Therapeutic Interventions/Progress Updates:    pt received in bed and agreeable to therapy. No complaint of pain. Pt requested assist with holding urinal, mod I  for clothing management. Supine>sit with supervision. Mod A to stand with stedy. Pt stood in stedy >25 min for WB through BLE and incr core control. Pt performed several sets of each of the following to challenge core control, LUE NMR and strength: sliding LUE across stedy bar, reaching with RUE outside BOS while maintaining grip with LUE, pushups on stedy bar with emphasis on tricep extension. Attempted Sit to stand to RW, but pt was unable to achieve adequate anterior weight shift. Shifted to part practice of anterior weight shift while hovering hips, reaching both arms forward toward bed, x 3 bouts. Stedy transfer back to bed, with pt demoing lower  assist level d/t improved weight shifting. Pt returned to bed with min A and was left with all needs in reach and alarm active.   Therapy Documentation Precautions:  Precautions Precautions: Fall Precaution Comments: Lt hemi with Lt sided edema Restrictions Weight Bearing Restrictions: No General:      Therapy/Group: Individual Therapy  Mickel Fuchs 03/28/2021, 3:14 PM

## 2021-03-28 NOTE — Progress Notes (Signed)
PROGRESS NOTE   Subjective/Complaints:  Pt in good spirits. Slept well. Using bipap. Denies pain. Feels that his left arm is getting stronger  ROS: Patient denies fever, rash, sore throat, blurred vision, nausea, vomiting, diarrhea, cough, shortness of breath or chest pain, joint or back pain, headache, or mood change.     Objective:   No results found. No results for input(s): WBC, HGB, HCT, PLT in the last 72 hours.   No results for input(s): NA, K, CL, CO2, GLUCOSE, BUN, CREATININE, CALCIUM in the last 72 hours.    Intake/Output Summary (Last 24 hours) at 03/28/2021 1024 Last data filed at 03/28/2021 [id number] Gross per 24 hour  Intake 355 ml  Output 1200 ml  Net -845 ml        Physical Exam: Vital Signs Blood pressure (!) 169/79, pulse 64, temperature 98.6 F (37 C), resp. rate 18, height 6' 0.99" (1.854 m), weight 129 kg, SpO2 97 %.  Constitutional: No distress . Vital signs reviewed. HEENT: NCAT, EOMI, oral membranes moist Neck: supple Cardiovascular: RRR without murmur. No JVD    Respiratory/Chest: CTA Bilaterally without wheezes or rales. Normal effort    GI/Abdomen: BS +, non-tender, non-distended Ext: no clubbing, cyanosis  Psych: pleasant and cooperative  Musculoskeletal: left hand swelling, k-tape     1-2+ LE edema B/L   Neurological:     Mental Status: He is alert.     Comments: Alert and oriented x 3. Normal insight and awareness. Intact Memory. Normal language and speech. Cranial nerve exam unremarkable except for LLQ quadrantanopsia ongoing. LUE 2- to 2/5 pec, biceps and 1+ to 2-/5 wrist and finger flexion.    LLE 2+/5 HE, KE and 1+/5 PF, 0-tr/5 DF. RUE and RLE grossly 5/5. No focal sensory abnl  Sensation intact to LT BUE and BLE      Assessment/Plan: 1. Functional deficits which require 3+ hours per day of interdisciplinary therapy in a comprehensive inpatient rehab setting. Physiatrist is  providing close team supervision and 24 hour management of active medical problems listed below. Physiatrist and rehab team continue to assess barriers to discharge/monitor patient progress toward functional and medical goals  Care Tool:  Bathing    Body parts bathed by patient: Right arm, Left arm, Chest, Abdomen   Body parts bathed by helper: Right arm, Left arm, Buttocks, Right lower leg, Left lower leg, Left upper leg     Bathing assist Assist Level: Minimal Assistance - Patient > 75%     Upper Body Dressing/Undressing Upper body dressing   What is the patient wearing?: Pull over shirt    Upper body assist Assist Level: Minimal Assistance - Patient > 75%    Lower Body Dressing/Undressing Lower body dressing      What is the patient wearing?: Pants, Underwear/pull up     Lower body assist Assist for lower body dressing: Dependent - Patient 0%     Toileting Toileting Toileting Activity did not occur (Clothing management and hygiene only): N/A (no void or bm)  Toileting assist Assist for toileting: Total Assistance - Patient < 25%     Transfers Chair/bed transfer  Transfers assist     Chair/bed transfer  assist level: Total Assistance - Patient < 25%     Locomotion Ambulation   Ambulation assist   Ambulation activity did not occur: Safety/medical concerns  Assist level: Maximal Assistance - Patient 25 - 49% Assistive device: Parallel bars Max distance: 2 ft   Walk 10 feet activity   Assist  Walk 10 feet activity did not occur: Safety/medical concerns        Walk 50 feet activity   Assist Walk 50 feet with 2 turns activity did not occur: Safety/medical concerns         Walk 150 feet activity   Assist Walk 150 feet activity did not occur: Safety/medical concerns         Walk 10 feet on uneven surface  activity   Assist Walk 10 feet on uneven surfaces activity did not occur: Safety/medical concerns          Wheelchair     Assist Is the patient using a wheelchair?: Yes Type of Wheelchair: Manual    Wheelchair assist level: Dependent - Patient 0% Max wheelchair distance: 150'    Wheelchair 50 feet with 2 turns activity    Assist        Assist Level: Dependent - Patient 0%   Wheelchair 150 feet activity     Assist      Assist Level: Dependent - Patient 0%   Blood pressure (!) 169/79, pulse 64, temperature 98.6 F (37 C), resp. rate 18, height 6' 0.99" (1.854 m), weight 129 kg, SpO2 97 %.    Medical Problem List and Plan: 1. 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 ?embolic. 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             -30 day outpatient cardiac event monitor recommended to r/o a fib              -patient may  shower             -ELOS/Goals: 10-14 days, supervision to min assist goals with PT and OT  -Continue CIR therapies including PT and OT. Interdisciplinary team conference today to discuss goals, barriers to discharge, and dc planning.  2.  Antithrombotics: -DVT/anticoagulation:  Mechanical: Sequential compression devices, below knee Bilateral lower extremities Pharmaceutical: Lovenox             -antiplatelet therapy: aspirin 81 mg 3. Pain Management: Tylenol 4. Mood: neuropsych eval today             -antipsychotic agents: n/a 5. Neuropsych: This patient is capable of making decisions on his own behalf. 6. Skin/Wound Care: routine skin checks.  --Monitor BLE ulcerations. 7. Fluids/Electrolytes/Nutrition: eating well  -protein supp for hypoalbuminemia      8. Hypertension: continue valsartan and coreg.    -diet discussed 1/10 coreg increased to 12.5mg  -fair control 9. 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 Hgb A1c = 11.1             -monitor cbg's, adjust regimen as needed  Carb  modified diet.   CBG (last 3)  Recent Labs    03/27/21 1646 03/27/21 2059 03/28/21 0614  GLUCAP 127* 184* 150*               1/10 increased levemir to 39u bid--observe today 10: Vitamin D deficiency: started on 1,000mg  daily supplement.  11: Hypothyroidism: continue Synthroid and recheck labs in 6 weeks 12: Peripheral  neuropathy: continue gabapentin 300 mg twice daily at home 13: Herpes zoster of chest: continue Valtrex for total of 2-3 weeks 14: OSA: home BiPAP, 2L O2  at night and naps 15: History of depression: Cymbalta listed on home meds 16: Tobacco use: Offer nicotine patch and provide cessation counseling 17: EtOH use:   Magnesium level reviewed and was 2.2 on 12/21.  Counseling as appropriate 18: Obesity BMI 37.53: Dietary consultation. Provided list of foods that can help with weight loss.  19: Venous insufficiency, bilateral lower extremities: Has had treatment for venous stasis ulcers including Unna boots and follow-up at [facility] wound care center.  Currently with small superficial ulcerations bilateral lower legs.  He routinely wears compression stockings. Legs are stable  1/10 resumed lasix -f/u labs later this week    LOS: 11 days A FACE TO FACE EVALUATION WAS PERFORMED  Ranelle Oyster 03/28/2021, 10:24 AM

## 2021-03-29 LAB — GLUCOSE, CAPILLARY
Glucose-Capillary: 125 mg/dL — ABNORMAL HIGH (ref 70–99)
Glucose-Capillary: 178 mg/dL — ABNORMAL HIGH (ref 70–99)
Glucose-Capillary: 166 mg/dL — ABNORMAL HIGH (ref 70–99)
Glucose-Capillary: 95 mg/dL (ref 70–99)
Glucose-Capillary: 106 mg/dL — ABNORMAL HIGH (ref 70–99)

## 2021-03-29 NOTE — Progress Notes (Signed)
Patient Joseph Ortega 89. Nurse called On call NP Riley Lam because patient has 39 units of scheduled LEVEMIR. Nurse expressed concerns about patients Joseph Ortega level and dosage of levemir. NP said she would call Pam and call me back with her answer.

## 2021-03-29 NOTE — Progress Notes (Signed)
Occupational Therapy Session Note  Patient Details  Name: Keron Neenan MRN: [id number] Date of Birth: 04-11-1955  Today's Date: 03/29/2021 OT Individual Time: 3225-6720 OT Individual Time Calculation (min): 28 min    Short Term Goals: Week 1:  OT Short Term Goal 1 (Week 1): Pt will complete BSC transfer with +2 assist to progress with OOB toileting OT Short Term Goal 1 - Progress (Week 1): Met OT Short Term Goal 2 (Week 1): Pt will complete sit<stand in Stedy with no more than Mod A during 2 consecutive ADL sessions OT Short Term Goal 2 - Progress (Week 1): Met OT Short Term Goal 3 (Week 1): Pt will assist with 1/3 components of toileting with no more than Mod balance assist OT Short Term Goal 3 - Progress (Week 1): Met Week 2:  OT Short Term Goal 1 (Week 2): Patient will use L UE as a stabilizer within grooming task with min questioning cues OT Short Term Goal 2 (Week 2): Patient will perform sit<>stand at the sink in preparation for BADL task with max A of 1 (without STedy)   Skilled Therapeutic Interventions/Progress Updates:    Pt greeted at time of session sitting up in wheelchair agreeable to OT session, no pain, and demonstrating increased use of LUE compared to previous sessions. Pt wanting to work on LUE this session, transported to main gym and set up at high low table. Focused on LUE AAROM towel slides for 1x15 shoulder flexion/extension with therapist assist at scapula for full range. Edema management techniques performed with retrograde massage beginning distally and moving proximal with visible reduction noted. Pt left in care of recreational therapist to assist back to room.  Therapy Documentation Precautions:  Precautions Precautions: Fall Precaution Comments: Lt hemi with Lt sided edema Restrictions Weight Bearing Restrictions: No     Therapy/Group: Individual Therapy  Viona Gilmore 03/29/2021, 12:58 PM

## 2021-03-29 NOTE — Progress Notes (Addendum)
Patient ID: Joseph Ortega, male   DOB: January 03, 1956, 66 y.o.   MRN: [id number]  SW spoke with pt wife Joseph Ortega [phone]) to provide updates from team conference and d/c date remains 1/28. SW discussed how team reports that pt will continue to require physical assistance, and natural supports. Will explore hiring someone. SW emailed her the Statistician. Preferred [facility] is [facility]. SW discussed fam edu. Wife will work on seeing if she is able to come in the week of pt discharge. Will follow-up with SW.   SW sent HHPT/OT referral to [facility].  Cecile Sheerer, MSW, LCSWA Office: [phone] Cell: [phone] Fax: [phone]

## 2021-03-29 NOTE — Progress Notes (Signed)
Occupational Therapy Session Note  Patient Details  Name: Joseph Ortega MRN: [id number] Date of Birth: 1955-07-29  Today's Date: 03/29/2021 OT Individual Time: 2993-7169 OT Individual Time Calculation (min): 53 min    Short Term Goals: Week 1:  OT Short Term Goal 1 (Week 1): Pt will complete BSC transfer with +2 assist to progress with OOB toileting OT Short Term Goal 1 - Progress (Week 1): Met OT Short Term Goal 2 (Week 1): Pt will complete sit<stand in Stedy with no more than Mod A during 2 consecutive ADL sessions OT Short Term Goal 2 - Progress (Week 1): Met OT Short Term Goal 3 (Week 1): Pt will assist with 1/3 components of toileting with no more than Mod balance assist OT Short Term Goal 3 - Progress (Week 1): Met Week 2:  OT Short Term Goal 1 (Week 2): Patient will use L UE as a stabilizer within grooming task with min questioning cues OT Short Term Goal 2 (Week 2): Patient will perform sit<>stand at the sink in preparation for BADL task with max A of 1 (without STedy)  Skilled Therapeutic Interventions/Progress Updates:    Pt received semi-reclined in bed, denies pain, agreeable to therapy. Session focus on self-care retraining, activity tolerance, LUE NMR, transfer retraining in prep for improved ADL/IADL/func mobility performance + decreased caregiver burden. Came to sitting EOB with min A to lift trunk and manage hemi-body. Total A to don B teds/gripper socks at bed level. Mod A to power up in standing with use of stedy > w/c . Self-propelled w/c via hemi-technique with close S and increased time > nurses station.   SB transfer > R side x2 with overall min A to block L knee and total A to place board. Completed 3 sit to stands with min to mod A to power up with use of RW and L saddle splint. Progressing from min to CGA for static standing balance and pt able to self-correct L lean with use of mirror for visual feedback. Completed 2x10 modified push-ups against RW to facilitate LUE.    Completed 2x10 reps against tilt stool and mild incline to promote increased L shoulder flexion/elbow extension.  Pt left seated in w/c with safety belt alarm engaged, call bell in reach, and all immediate needs met.    Therapy Documentation Precautions:  Precautions Precautions: Fall Precaution Comments: Lt hemi with Lt sided edema Restrictions Weight Bearing Restrictions: No Pain: denies ADL: See Care Tool for more details.  Therapy/Group: Individual Therapy  Volanda Napoleon MS, OTR/L  03/29/2021, 6:51 AM

## 2021-03-29 NOTE — Evaluation (Signed)
Recreational Therapy Assessment and Plan  Patient Details  Name: Joseph Ortega MRN: [id number] Date of Birth: 1955-06-30 Today's Date: 03/29/2021  Rehab Potential:  Good ELOS:   d/c 04/15/21  Assessment Hospital Problem: Principal Problem:   ICH (intracerebral hemorrhage) ([place])     Past Medical History:      Past Medical History:  Diagnosis Date   Diabetes mellitus without complication ([place])     Hypertension     Thyroid disease      Past Surgical History:       Past Surgical History:  Procedure Laterality Date   HERNIA REPAIR       IR ANGIO INTRA EXTRACRAN SEL COM CAROTID INNOMINATE BILAT MOD SED   03/09/2021   IR ANGIO VERTEBRAL SEL VERTEBRAL BILAT MOD SED   03/09/2021   IR US GUIDE VASC ACCESS RIGHT   03/09/2021      Assessment & Plan Clinical Impression: Patient is a 66 year old right-handed male who was in his usual state of health until March 08, 2021 when he presented to [facility] emergency department complaining of left arm and leg weakness. Code stroke was initiated.  Neurology was consulted.  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  He was transferred to [facility].  He was noted to have a left anterior chest rash consistent with herpes zoster.  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  He was admitted to the neuro ICU and placed on Cleviprex and Keppra.  Neurosurgery was consulted.  On 12/22 he underwent four-vessel cerebral arteriogram by Dr. Estanislado Pandy.  There was no evidence of DAVF, AV shunting, AVM, aneurysms or of dissection.  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  He was hemodynamically stable. He underwent speech language pathology evaluation and was started on regular diet.  A repeat CT of the head on 12/25 was stable and aspirin therapy was initiated.  He underwent  bilateral lower extremity venous Doppler study on 12/25 negative for DVT.  He underwent CTA of the neck the following day and was unchanged from prior.  Left upper quadrantanopia improved; lower quadrantanopia persisted.  Blood pressure remained controlled on valsartan and metoprolol.  He was continued on his home BiPAP with history of obstructive sleep apnea.  Keppra continues.  Patient will need 30-day event monitor.  Diabetes coordinator was consulted for assistance with blood glucoses and insulin dosing. Patient was overall requiring mod physical assist for bed mobility and +2 for transfer Patient transferred to CIR on 03/17/2021 .   Pt presents with decreased activity tolerance, decreased functional mobility, decreased balance, decreased coordination, left inattention Limiting pt's independence with leisure/community pursuits.  Met with pt today to discuss leisure interests, leisure education, activity analysis/modifications, stress management.  Also discussed role of social, emotional, spiritual health in addition to physical health and their impact on each other and overall wellness.  Pt stated understanding.  Plan  Min 1 TR session >20 minute per week during LOS  Recommendations for other services: None   Discharge Criteria: Patient will be discharged from TR if patient refuses treatment 3 consecutive times without medical reason.  If treatment goals not met, if there is a change in medical status, if patient makes no progress towards goals or if patient is discharged from hospital.  The above assessment, treatment plan, treatment alternatives and goals were discussed and mutually agreed upon: by patient  [place] 03/29/2021, 3:48 PM

## 2021-03-29 NOTE — Progress Notes (Signed)
Occupational Therapy Session Note  Patient Details  Name: Joseph Ortega MRN: [id number] Date of Birth: [birth year]  Today's Date: 03/29/2021 OT Individual Time: 1100-1200 OT Individual Time Calculation (min): 60 min    Short Term Goals: Week 2:  OT Short Term Goal 1 (Week 2): Patient will use L UE as a stabilizer within grooming task with min questioning cues OT Short Term Goal 2 (Week 2): Patient will perform sit<>stand at the sink in preparation for BADL task with max A of 1 (without STedy)  Skilled Therapeutic Interventions/Progress Updates:  Pt greeted seated in w/c  agreeable to OT intervention. Session focus on LUE ROM and functional mobility. All ADL needs were met, pt transported to gym with total A. Applied new walker splint for L hand as old one didn't not have all necessary pieces.  Pt requested to work on standing. Pt needed x4 attempts and MAX A to stand from w/c with RW. Pt reports fatigue in legs so focused on LUE remainder of session. Pt completed 2x10 horizontal shoulder ABD/ADD and scapular protraction/retraction on table.  Pt additionally worked on rolling ball forward/backwards as well as R and L to promote active assist scapular protraction/retraction and horizontal ABD/ADD. Worked on [employer] with pt completing elbow flexion/extension with weighted ball with hand over hand assist provided for LUE, also worked on supination/pronation with weighted ball with pt needed hand over hand assist to grasp ball. Pt transported back to room with total A where pt left up in w/c with alarm belt activated and all needs within reach.                       Therapy Documentation Precautions:  Precautions Precautions: Fall Precaution Comments: Lt hemi with Lt sided edema Restrictions Weight Bearing Restrictions: No  Pain: no pain reported during session     Therapy/Group: Individual Therapy  Corinne Ports [facility] 03/29/2021, 12:27 PM

## 2021-03-29 NOTE — Progress Notes (Signed)
Nurse called reporting Joseph Ortega 21:00 glucose was 95. She voiced concern regarding administering Levemir. She was instructed to give snack and repeat fingerstick in a hour. Awaiting results.  At 23:00 awaiting fingerstick results: placed a call and spoke with nurse, she will obtain the fingerstick She will also speak with Joseph Ortega, she states he was refusing his Levemir earlier, will await results.  At23:16, nurse return the call and stated Joseph Ortega fingerstick is 76 and Joseph Ortega willl take his Levemir.

## 2021-03-29 NOTE — Progress Notes (Signed)
Physical Therapy Session Note  Patient Details  Name: Joseph Ortega MRN: [id number] Date of Birth: 1955-08-22  Today's Date: 03/29/2021 PT Individual Time: 1340-1435 PT Individual Time Calculation (min): 55 min   Short Term Goals: Week 2:  PT Short Term Goal 1 (Week 2): Pt will perform bed mobility consistently with minA. PT Short Term Goal 2 (Week 2): Pt will perform sit to stand consistently with minA. PT Short Term Goal 3 (Week 2): Pt will perform bed to chair consistently with minA. PT Short Term Goal 4 (Week 2): Pt will ambulate x50' with modA +1 and LRAD.  Skilled Therapeutic Interventions/Progress Updates:     Patient in w/c in the room upon PT arrival. Patient alert and agreeable to PT session. Patient denied pain during session.  Focused session on gait training in Lite Gait, required increased time for set-up and education of use and benefits of equipment prior to gait training.   Therapeutic Activity: Transfers: Patient performed sit to/from stand x4 holding Lite Gait hands without body weight support with mod-max A on first trial due to lower extremity stiffness and min A on subsequent trials. Provided verbal cues for foot placement, forward weight shift, and hip extension L>R. Patient performed standing balance with B upper extremity support 3x2-3 min with CGA-supervision to don/doff and adjust harness.   Gait Training:  Patient ambulated 20 feet and 56 feet using the Lite Gait with min A for weight shifting and +2 for device propulsion over ground. L hand held to hand grip using ACE wrap for improved body symmetry during gait training. Ambulated with L trunk lean, decreased hip/knee extension in L stance, decreased R weight shift, slow initiation of L swing phase, and downward head gaze. Provided multimodal cues for erect posture and looking ahead with external visual cue, increased R weight shift with external visual cue of head placement between the support straps, and  increased L hemi-body elongation in stance. Patient responded best to external cues and summative feedback.   Patient in w/c in the room at end of session with breaks locked, seat belt alarm set, and all needs within reach. Attempted to locate a 20"x18" w/c to replace the patient's for allow breaks to be tightened, unable to locate an appropriate w/c at this time, will address break adjustment during next session.   Therapy Documentation Precautions:  Precautions Precautions: Fall Precaution Comments: Lt hemi with Lt sided edema Restrictions Weight Bearing Restrictions: No    Therapy/Group: Individual Therapy  Doroteo Nickolson L Irwin Toran PT, DPT, CBIS 03/29/2021, 4:11 PM

## 2021-03-29 NOTE — Progress Notes (Signed)
PROGRESS NOTE   Subjective/Complaints:  No problems overnight. Denies pain. Sleeping with cpap  ROS: Patient denies fever, rash, sore throat, blurred vision, nausea, vomiting, diarrhea, cough, shortness of breath or chest pain, joint or back pain, headache, or mood change.     Objective:   No results found. No results for input(s): WBC, HGB, HCT, PLT in the last 72 hours.   No results for input(s): NA, K, CL, CO2, GLUCOSE, BUN, CREATININE, CALCIUM in the last 72 hours.    Intake/Output Summary (Last 24 hours) at 03/29/2021 0913 Last data filed at 03/29/2021 0725 Gross per 24 hour  Intake 355 ml  Output 2150 ml  Net -1795 ml        Physical Exam: Vital Signs Blood pressure 140/71, pulse 67, temperature 98.1 F (36.7 C), resp. rate 14, height 6' 0.99" (1.854 m), weight 129 kg, SpO2 94 %.  Constitutional: No distress . Vital signs reviewed. HEENT: NCAT, EOMI, oral membranes moist Neck: supple Cardiovascular: RRR without murmur. No JVD    Respiratory/Chest: CTA Bilaterally without wheezes or rales. Normal effort    GI/Abdomen: BS +, non-tender, non-distended Ext: no clubbing, cyanosis, a Psych: pleasant and cooperative  Musculoskeletal: left hand swelling, k-tape     1+ LE edema B/L   Neurological:     Mental Status: He is alert.     Comments: Alert and oriented x 3. Normal insight and awareness. Intact Memory. Normal language and speech. Cranial nerve exam unremarkable except for LLQ quadrantanopsia ongoing. LUE 2- to 2/5 pec, biceps and 1+ to 2-/5 wrist and finger flexion.    LLE 2+ to 3-/5 HE, KE and 1+/5 PF, 0-tr/5 DF. RUE and RLE grossly 5/5. Intact sensation to LT/pain       Assessment/Plan: 1. Functional deficits which require 3+ hours per day of interdisciplinary therapy in a comprehensive inpatient rehab setting. Physiatrist is providing close team supervision and 24 hour management of active medical  problems listed below. Physiatrist and rehab team continue to assess barriers to discharge/monitor patient progress toward functional and medical goals  Care Tool:  Bathing    Body parts bathed by patient: Left arm, Chest, Abdomen, Front perineal area, Right upper leg, Left upper leg, Face   Body parts bathed by helper: Left lower leg, Right lower leg, Buttocks, Right arm     Bathing assist Assist Level: Moderate Assistance - Patient 50 - 74%     Upper Body Dressing/Undressing Upper body dressing   What is the patient wearing?: Pull over shirt    Upper body assist Assist Level: Minimal Assistance - Patient > 75%    Lower Body Dressing/Undressing Lower body dressing      What is the patient wearing?: Underwear/pull up, Pants     Lower body assist Assist for lower body dressing: Maximal Assistance - Patient 25 - 49%     Toileting Toileting Toileting Activity did not occur (Clothing management and hygiene only): N/A (no void or bm)  Toileting assist Assist for toileting: Maximal Assistance - Patient 25 - 49%     Transfers Chair/bed transfer  Transfers assist     Chair/bed transfer assist level: Maximal Assistance - Patient 25 -  49%     Locomotion Ambulation   Ambulation assist   Ambulation activity did not occur: Safety/medical concerns  Assist level: Moderate Assistance - Patient 50 - 74% Assistive device: Other (comment) (R guardrail) Max distance: 25'   Walk 10 feet activity   Assist  Walk 10 feet activity did not occur: Safety/medical concerns  Assist level: Moderate Assistance - Patient - 50 - 74% Assistive device: Other (comment) (R guardrail)   Walk 50 feet activity   Assist Walk 50 feet with 2 turns activity did not occur: Safety/medical concerns         Walk 150 feet activity   Assist Walk 150 feet activity did not occur: Safety/medical concerns         Walk 10 feet on uneven surface  activity   Assist Walk 10 feet on  uneven surfaces activity did not occur: Safety/medical concerns         Wheelchair     Assist Is the patient using a wheelchair?: Yes Type of Wheelchair: Manual    Wheelchair assist level: Dependent - Patient 0% Max wheelchair distance: 150'    Wheelchair 50 feet with 2 turns activity    Assist        Assist Level: Dependent - Patient 0%   Wheelchair 150 feet activity     Assist      Assist Level: Dependent - Patient 0%   Blood pressure 140/71, pulse 67, temperature 98.1 F (36.7 C), resp. rate 14, height 6' 0.99" (1.854 m), weight 129 kg, SpO2 94 %.    Medical Problem List and Plan: 1. 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 ?embolic. 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             -30 day outpatient cardiac event monitor recommended to r/o a fib              -patient may  shower             -ELOS/Goals: 04/15/21, supervision to min assist goals with PT and OT  -Continue CIR therapies including PT, OT, and SLP  2.  Antithrombotics: -DVT/anticoagulation:  Mechanical: Sequential compression devices, below knee Bilateral lower extremities Pharmaceutical: Lovenox             -antiplatelet therapy: aspirin 81 mg 3. Pain Management: Tylenol 4. Mood: neuropsych eval today             -antipsychotic agents: n/a 5. Neuropsych: This patient is capable of making decisions on his own behalf. 6. Skin/Wound Care: routine skin checks.  --Monitor BLE ulcerations. 7. Fluids/Electrolytes/Nutrition: eating well  -protein supp for hypoalbuminemia      8. Hypertension: continue valsartan and coreg.    -diet discussed 1/11 coreg increased to 12.5mg  with improved control 9. 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 Hgb A1c = 11.1             -monitor cbg's, adjust regimen as needed  Carb modified diet.    CBG (last 3)  Recent Labs    03/28/21 1648 03/28/21 2058 03/29/21 0625  GLUCAP 151* 108* 125*               1/11 levemir 39u bid--reasonable control. continue 10: Vitamin D deficiency: started on 1,000mg  daily supplement.  11: Hypothyroidism: continue Synthroid and recheck labs in 6 weeks 12: Peripheral neuropathy: continue gabapentin 300 mg twice daily at  home 13: Herpes zoster of chest: continue Valtrex for total of 2-3 weeks 14: OSA: home BiPAP, 2L O2  at night and naps 15: History of depression: Cymbalta listed on home meds 16: Tobacco use: Offer nicotine patch and provide cessation counseling 17: EtOH use:   Magnesium level reviewed and was 2.2 on 12/21.  Counseling as appropriate 18: Obesity BMI 37.53: Dietary consultation. Provided list of foods that can help with weight loss.  19: Venous insufficiency, bilateral lower extremities: Has had treatment for venous stasis ulcers including Unna boots and follow-up at [facility] wound care center.  Currently with small superficial ulcerations bilateral lower legs.  He routinely wears compression stockings. Legs are stable  1/10 resumed lasix -f/u labs Thursday    LOS: 12 days A FACE TO FACE EVALUATION WAS PERFORMED  Ranelle Oyster 03/29/2021, 9:13 AM

## 2021-03-30 LAB — BASIC METABOLIC PANEL
Anion gap: 8 (ref 5–15)
BUN: 11 mg/dL (ref 8–23)
CO2: 29 mmol/L (ref 22–32)
Calcium: 8.5 mg/dL — ABNORMAL LOW (ref 8.9–10.3)
Chloride: 101 mmol/L (ref 98–111)
Creatinine, Ser: 0.75 mg/dL (ref 0.61–1.24)
GFR, Estimated: >60 mL/min (ref >60)
Glucose, Bld: 128 mg/dL — ABNORMAL HIGH (ref 70–99)
Potassium: 3.6 mmol/L (ref 3.5–5.1)
Sodium: 138 mmol/L (ref 135–145)

## 2021-03-30 LAB — GLUCOSE, CAPILLARY
Glucose-Capillary: 118 mg/dL — ABNORMAL HIGH (ref 70–99)
Glucose-Capillary: 193 mg/dL — ABNORMAL HIGH (ref 70–99)
Glucose-Capillary: 146 mg/dL — ABNORMAL HIGH (ref 70–99)
Glucose-Capillary: 140 mg/dL — ABNORMAL HIGH (ref 70–99)

## 2021-03-30 LAB — CBC
HCT: 36 % — ABNORMAL LOW (ref 39.0–52.0)
Hemoglobin: 12.3 g/dL — ABNORMAL LOW (ref 13.0–17.0)
MCH: 32 pg (ref 26.0–34.0)
MCHC: 34.2 g/dL (ref 30.0–36.0)
MCV: 93.8 fL (ref 80.0–100.0)
Platelets: 191 10*3/uL (ref 150–400)
RBC: 3.84 MIL/uL — ABNORMAL LOW (ref 4.22–5.81)
RDW: 13.5 % (ref 11.5–15.5)
WBC: 5.6 10*3/uL (ref 4.0–10.5)
nRBC: 0 % (ref 0.0–0.2)

## 2021-03-30 MED ORDER — DICLOFENAC SODIUM 1 % EX GEL
2.0000 g | Freq: Three times a day (TID) | CUTANEOUS | Status: DC
  Administered 2021-03-30 – 2021-04-16 (×29): 2 g via TOPICAL
  Filled 2021-03-30: qty 100

## 2021-03-30 NOTE — Progress Notes (Signed)
Patient rested well throughout the night. BS this morning was 118 no insulin needed.

## 2021-03-30 NOTE — Progress Notes (Signed)
Physical Therapy Session Note  Patient Details  Name: Joseph Ortega MRN: [id number] Date of Birth: 1955/06/21  Today's Date: 03/30/2021 PT Individual Time: 3435-6861 PT Individual Time Calculation (min): 54 min   Short Term Goals: Week 1:  PT Short Term Goal 1 (Week 1): Pt will perform be mobility with modA. PT Short Term Goal 1 - Progress (Week 1): Met PT Short Term Goal 2 (Week 1): Pt will perform sit to stand with maxA +1. PT Short Term Goal 2 - Progress (Week 1): Met PT Short Term Goal 3 (Week 1): Pt will perform bed to chair transfer with modA +1 consistently. PT Short Term Goal 3 - Progress (Week 1): Progressing toward goal PT Short Term Goal 4 (Week 1): Pt will ambulate x25' with modA +2 and LRAD. PT Short Term Goal 4 - Progress (Week 1): Met Week 2:  PT Short Term Goal 1 (Week 2): Pt will perform bed mobility consistently with minA. PT Short Term Goal 2 (Week 2): Pt will perform sit to stand consistently with minA. PT Short Term Goal 3 (Week 2): Pt will perform bed to chair consistently with minA. PT Short Term Goal 4 (Week 2): Pt will ambulate x50' with modA +1 and LRAD. Week 3:     Skilled Therapeutic Interventions/Progress Updates:   PAIN  denies pain Pt initally oob in wc and NT assessing vitals.  Pt agreeable to session w/focus on LiteGait training.  Pt transported to long hallway outside main gym. Performs Sit to stand in Litegait using R handle to pull w/overall min to mod assist of 1. Pt able to stand 3-4 min x 2 for donning of harness/LiteGait set up.  Gait trials as follows: Trial 1: 165f w/approx 20% reduced to approx 10% bodywt support at 515f  Pt w/L lean, narrow step width L, ER of R hip w/advancement, heavy manual assst to promote advancemet of L hip from loadng thru midstance/retracts otherwise w/trunk rotation L,  manual facilitation of wt shift R   Discussed and demonstrated hip advancement during rest break  131 ft as above, 10% bodywt support,  deviations as above, pt at times able to improve upon midline orientatio as well as L hip advancement but w/much cueing.  Pt able to stand w/min assist w/Litegait frame while harness doffed, able to boost in sitting for completion of harness removal.    At end of session, pt transported to room.  Pt left oob in wc w/alarm belt set and needs in reach   Therapy Documentation Precautions:  Precautions Precautions: Fall Precaution Comments: Lt hemi with Lt sided edema Restrictions Weight Bearing Restrictions: No  Therapy/Group: Individual Therapy BaCallie FieldingPTInwood/02/2022, 4:23 PM

## 2021-03-30 NOTE — Progress Notes (Signed)
Patient ID: Joseph Ortega, male   DOB: [birth year], 66 y.o.   MRN: [id number]  SW waiting on updates from Angela[facility] with regard to PT/OT referral submitted.  *referral accepted.   Cecile Sheerer, MSW, LCSWA Office: [phone] Cell: [phone] Fax: [phone]

## 2021-03-30 NOTE — Progress Notes (Signed)
PROGRESS NOTE   Subjective/Complaints:  Pt without new complaints. I asked him about his right knee (which he has not mentioned to me.) Replied "must have been my wife who was asking about it." He states it bothers him a bit when he's up on it for a while but that it's not too severe.   ROS: Patient denies fever, rash, sore throat, blurred vision, nausea, vomiting, diarrhea, cough, shortness of breath or chest pain,   headache, or mood change.     Objective:   No results found. Recent Labs    03/30/21 0546  WBC 5.6  HGB 12.3*  HCT 36.0*  PLT 191     Recent Labs    03/30/21 0546  NA 138  K 3.6  CL 101  CO2 29  GLUCOSE 128*  BUN 11  CREATININE 0.75  CALCIUM 8.5*      Intake/Output Summary (Last 24 hours) at 03/30/2021 1024 Last data filed at 03/30/2021 0700 Gross per 24 hour  Intake 714 ml  Output 1700 ml  Net -986 ml        Physical Exam: Vital Signs Blood pressure (!) 154/77, pulse 69, temperature 98.1 F (36.7 C), temperature source Oral, resp. rate 16, height 6' 0.99" (1.854 m), weight 129 kg, SpO2 94 %.  Constitutional: No distress . Vital signs reviewed. HEENT: NCAT, EOMI, oral membranes moist Neck: supple Cardiovascular: RRR without murmur. No JVD    Respiratory/Chest: CTA Bilaterally without wheezes or rales. Normal effort    GI/Abdomen: BS +, non-tender, non-distended Ext: no clubbing, cyanosis, or edema Psych: pleasant and cooperative  Musculoskeletal: left hand edema better     tr 1+ LE edema B/L  Right knee with crepitus, mild effusion at best.  Neurological:     Mental Status: He is alert.     Comments: Alert and oriented x 3. Normal insight and awareness. Intact Memory. Normal language and speech. Cranial nerve exam unremarkable except for LLQ quadrantanopsia ongoing. LUE 2- /5 pec, biceps and 1+ to 2-/5 wrist and finger flexion.    LLE 2+ to 3-/5 HE, KE and 1+/5 PF, 0-tr/5  DF--stable. RUE and RLE grossly 5/5. Intact sensation to LT/pain       Assessment/Plan: 1. Functional deficits which require 3+ hours per day of interdisciplinary therapy in a comprehensive inpatient rehab setting. Physiatrist is providing close team supervision and 24 hour management of active medical problems listed below. Physiatrist and rehab team continue to assess barriers to discharge/monitor patient progress toward functional and medical goals  Care Tool:  Bathing    Body parts bathed by patient: Left arm, Chest, Abdomen, Front perineal area, Right upper leg, Left upper leg, Face   Body parts bathed by helper: Left lower leg, Right lower leg, Buttocks, Right arm     Bathing assist Assist Level: Moderate Assistance - Patient 50 - 74%     Upper Body Dressing/Undressing Upper body dressing   What is the patient wearing?: Pull over shirt    Upper body assist Assist Level: Minimal Assistance - Patient > 75%    Lower Body Dressing/Undressing Lower body dressing      What is the patient wearing?: Underwear/pull up,  Pants     Lower body assist Assist for lower body dressing: Maximal Assistance - Patient 25 - 49%     Toileting Toileting Toileting Activity did not occur (Clothing management and hygiene only): N/A (no void or bm)  Toileting assist Assist for toileting: Maximal Assistance - Patient 25 - 49%     Transfers Chair/bed transfer  Transfers assist     Chair/bed transfer assist level: Maximal Assistance - Patient 25 - 49%     Locomotion Ambulation   Ambulation assist   Ambulation activity did not occur: Safety/medical concerns  Assist level: Moderate Assistance - Patient 50 - 74% Assistive device: Other (comment) (R guardrail) Max distance: 25'   Walk 10 feet activity   Assist  Walk 10 feet activity did not occur: Safety/medical concerns  Assist level: Moderate Assistance - Patient - 50 - 74% Assistive device: Other (comment) (R guardrail)    Walk 50 feet activity   Assist Walk 50 feet with 2 turns activity did not occur: Safety/medical concerns         Walk 150 feet activity   Assist Walk 150 feet activity did not occur: Safety/medical concerns         Walk 10 feet on uneven surface  activity   Assist Walk 10 feet on uneven surfaces activity did not occur: Safety/medical concerns         Wheelchair     Assist Is the patient using a wheelchair?: Yes Type of Wheelchair: Manual    Wheelchair assist level: Dependent - Patient 0% Max wheelchair distance: 150'    Wheelchair 50 feet with 2 turns activity    Assist        Assist Level: Dependent - Patient 0%   Wheelchair 150 feet activity     Assist      Assist Level: Dependent - Patient 0%   Blood pressure (!) 154/77, pulse 69, temperature 98.1 F (36.7 C), temperature source Oral, resp. rate 16, height 6' 0.99" (1.854 m), weight 129 kg, SpO2 94 %.    Medical Problem List and Plan: 1. 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 ?embolic. 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             -30 day outpatient cardiac event monitor recommended to r/o a fib              -patient may  shower             -ELOS/Goals: 04/15/21, supervision to min assist goals with PT and OT  -Continue CIR therapies including PT, OT, and SLP   2.  Antithrombotics: -DVT/anticoagulation:  Mechanical: Sequential compression devices, below knee Bilateral lower extremities Pharmaceutical: Lovenox             -antiplatelet therapy: aspirin 81 mg 3. Pain Management: Tylenol  -pt downplayed his right knee pain  -exam +/-  -will schedule voltaren gel and see how he does 4. Mood: neuropsych eval today             -antipsychotic agents: n/a 5. Neuropsych: This patient is capable of making decisions on his own behalf. 6.  Skin/Wound Care: routine skin checks.  --Monitor BLE ulcerations. 7. Fluids/Electrolytes/Nutrition: eating well  -protein supp for hypoalbuminemia    -I personally reviewed the patient's labs today.   8. Hypertension: continue valsartan and coreg.    -diet discussed 1/12 coreg increased to 12.5mg  with  reasonable control presently 9. 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 Hgb A1c = 11.1             -monitor cbg's, adjust regimen as needed  Carb modified diet.   CBG (last 3)  Recent Labs    03/29/21 2107 03/29/21 2313 03/30/21 0616  GLUCAP 95 106* 118*               1/12 levemir increased to 39u bid--improved control 10: Vitamin D deficiency: started on 1,000mg  daily supplement.  11: Hypothyroidism: continue Synthroid and recheck labs in 6 weeks 12: Peripheral neuropathy: continue gabapentin 300 mg twice daily at home 13: Herpes zoster of chest: continue Valtrex for total of 2-3 weeks 14: OSA: home BiPAP, 2L O2  at night and naps 15: History of depression: Cymbalta listed on home meds 16: Tobacco use: Offer nicotine patch and provide cessation counseling 17: EtOH use:   Magnesium level reviewed and was 2.2 on 12/21.  Counseling as appropriate 18: Obesity BMI 37.53: Dietary consultation. Provided list of foods that can help with weight loss.  19: Venous insufficiency, bilateral lower extremities: Has had treatment for venous stasis ulcers including Unna boots and follow-up at [facility] wound care center.  Currently with small superficial ulcerations bilateral lower legs.  He routinely wears compression stockings. Legs are stable  1/10 resumed lasix 1/12-edema improved   LOS: 13 days A FACE TO FACE EVALUATION WAS PERFORMED  Ranelle Oyster 03/30/2021, 10:24 AM

## 2021-03-30 NOTE — Discharge Summary (Addendum)
Physician Discharge Summary  Patient ID: Joseph Ortega MRN: [id number] DOB/AGE: [birth year] 66 y.o.  Admit date: 03/17/2021 Discharge date: 04/18/2021  Discharge Diagnoses:  Principal Problem:   ICH (intracerebral hemorrhage) (HCC) Active Problems: Functional deficits secondary to ICH LUE, LLE weakness Hypertension Diabetes mellitus-2 Vitamin D deficiency Hypothyroidism Peripheral neuropathy Herpes zoster Obstructive sleep apnea Depression Tobacco use Alcohol use Obesity Venous insufficiency BLE venous ulcers Nausea/bloating Osteoarthritis right knee Hypokalemia  Discharged Condition: stable  Significant Diagnostic Studies: No results found.  Labs:  Basic Metabolic Panel: Recent Labs  Lab 04/17/21 0533  NA 141  K 3.8  CL 102  CO2 31  GLUCOSE 119*  BUN 18  CREATININE 0.96  CALCIUM 8.9    CBC: No results for input(s): WBC, NEUTROABS, HGB, HCT, MCV, PLT in the last 168 hours.   CBG: Recent Labs  Lab 04/17/21 1136 04/17/21 1656 04/17/21 2056 04/18/21 0606 04/18/21 1133  GLUCAP 142* 130* 89 116* 159*    Brief HPI:   Joseph Ortega is a 66 y.o. male who presented to [facility] emergency department complaining of left arm and left leg weakness on 03/08/2021.  Code stroke initiated and neurology consulted.  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  He was transferred to [facility].  He was noted to have a left anterior chest rash consistent with herpes zoster.  He was admitted to the neuro ICU and placed on Cleviprex and Keppra.  Neurosurgery was consulted.  On 12/22 he underwent four-vessel cerebral arteriogram.  On follow-up MRI on 12/23 and new right occipital lobe infarction enlargement in the posterior frontal infarction was noted.  Repeat head CT 12/25 was stable and aspirin was started.   Hospital Course: Joseph Ortega was admitted to rehab 03/17/2021 for inpatient  therapies to consist of PT, ST and OT at least three hours five days a week. Past admission physiatrist, therapy team and rehab RN have worked together to provide customized collaborative inpatient rehab. Vision deficits improved.  Complained of worsening lower extremity edema with TED hose in place.  Home dose of Lasix 20 mg daily resumed on 1/9.  Neuropsychology eval 1/9.  His CPAP continued 2 L supplemental O2 at nighttime and with naps.  Protein supplements were continued for hypoalbuminemia.  He was eating well. Valtrex continued for two weeks of therapy and discontinued on 1/5. Lower extremity venous stasis wounds remained stable. Edema improved. Serum potassium trended downward and Klor-con 20 meq started 1/16. Described abdominal bloating and nausea associated with meals and had similar symptoms PTA and started Reglan TID with meals on 04/04/2021. Abdominal symptoms improved on 1/20. Home Farxiga resumed on 1/23. HCTZ also added to Coreg and Avapro for edema and improved BP control. Lasix discontinued.   Blood pressures were monitored on TID basis and noted to have persistent elevated BP on valsartan and Coreg and on 1/5 increased Coreg to 9.375 mg BID. Coreg increased to 12.5 mg on 1/13. HCTZ added on 1/23.  Diabetes has been monitored with ac/hs CBG checks and SSI was use prn for tighter BS control. Levimir increased to 37u BID on 1/3 and to 38u BID on 1/5. Increased to 39 units on 1/17. Home Farxiga restarted on 1/23. Continued Novolog 14 units with meals daily.  Rehab course: During patient's stay in rehab weekly team conferences were held to monitor patient's progress, set goals and discuss barriers to discharge. At admission, patient required mod physical assist for bed mobility and +  2 for transfers.  He has had improvement in activity tolerance, balance, postural control as well as ability to compensate for deficits. He has had improvement in functional use LUE  and LLE as well as improvement  in awareness   Disposition:  Discharge disposition: 01-Home or Self Care       Diet: Heart healthy, carb modified  Special Instructions: Patient enrolled for [employer] to ship a 30 day Cardiac event monitor to his address on file. Letter with instructions mailed to patient.  No driving, alcohol consumption or tobacco use.   Check blood sugars 4 times daily.  Do no use additional aspirin containing products, NSAIDs (Aleve, ibuprofen, Motrin).  30-35 minutes were spent on discharge planning and discharge summary.   Discharge Instructions     Ambulatory referral to Neurology   Complete by: As directed    An appointment is requested in approximately: 4 weeks   Ambulatory referral to Physical Medicine Rehab   Complete by: As directed    Follow-up ICH   Discharge patient   Complete by: As directed    Discharge disposition: 01-Home or Self Care   Discharge patient date: 04/18/2021        Allergies as of 04/18/2021   No Known Allergies      Medication List     STOP taking these medications    gabapentin 300 MG capsule Commonly known as: NEURONTIN   metoprolol tartrate 50 MG tablet Commonly known as: LOPRESSOR       TAKE these medications    acetaminophen 325 MG tablet Commonly known as: TYLENOL Take 2 tablets (650 mg total) by mouth every 6 (six) hours as needed for mild pain or headache.   aspirin 81 MG EC tablet Take 1 tablet (81 mg total) by mouth daily. Swallow whole.   atorvastatin 40 MG tablet Commonly known as: LIPITOR Take 1 tablet (40 mg total) by mouth daily.   [place] Pen Needles 31G X 6 MM Misc Generic drug: Insulin Pen Needle 1 application by Does not apply route 5 (five) times daily.   B-complex with vitamin C tablet Take 1 tablet by mouth daily.   carvedilol 12.5 MG tablet Commonly known as: COREG Take 1 tablet (12.5 mg total) by mouth 2 (two) times daily with a meal.   dapagliflozin propanediol 5 MG Tabs tablet Commonly known  as: FARXIGA Take 1 tablet (5 mg total) by mouth daily. Start taking on: April 19, 2021 What changed:  medication strength how much to take   diclofenac Sodium 1 % Gel Commonly known as: VOLTAREN Apply 2 g topically 3 (three) times daily. To right knee   docusate sodium 100 MG capsule Commonly known as: COLACE Take 1 capsule (100 mg total) by mouth 2 (two) times daily as needed for mild constipation.   doxazosin 8 MG tablet Commonly known as: CARDURA Take 1 tablet by mouth daily.   DULoxetine 20 MG capsule Commonly known as: CYMBALTA Take 20 mg by mouth 2 (two) times daily.   fluticasone 50 MCG/ACT nasal spray Commonly known as: FLONASE Place 1 spray into both nostrils daily.   hydrochlorothiazide 25 MG tablet Commonly known as: HYDRODIURIL Take 1 tablet (25 mg total) by mouth daily.   insulin detemir 100 UNIT/ML FlexPen Commonly known as: LEVEMIR Inject 39 Units into the skin 2 (two) times daily. What changed:  how much to take when to take this   levothyroxine 125 MCG tablet Commonly known as: SYNTHROID Take 125 mcg by mouth daily before  breakfast.   metoCLOPramide 5 MG tablet Commonly known as: REGLAN Take 1 tablet (5 mg total) by mouth 3 (three) times daily before meals.   mirtazapine 15 MG tablet Commonly known as: REMERON Take 0.5 tablets (7.5 mg total) by mouth at bedtime.   naphazoline-glycerin 0.012-0.25 % Soln Commonly known as: CLEAR EYES REDNESS Place 1-2 drops into both eyes 4 (four) times daily as needed for eye irritation.   NovoLOG FlexPen 100 UNIT/ML FlexPen Generic drug: insulin aspart Inject 14 Units into the skin 3 (three) times daily with meals. What changed:  how much to take when to take this additional instructions   nutrition supplement (JUVEN) Pack Take 1 packet by mouth daily at 12 noon.   potassium chloride SA 20 MEQ tablet Commonly known as: KLOR-CON M Take 1 tablet (20 mEq total) by mouth 2 (two) times daily.    traZODone 50 MG tablet Commonly known as: DESYREL Take 1 tablet (50 mg total) by mouth at bedtime.   Trulicity 3 MG/0.5ML Sopn Generic drug: Dulaglutide Inject 3 mg into the skin every Thursday.   valsartan 320 MG tablet Commonly known as: DIOVAN Take 1 tablet (320 mg total) by mouth daily.   Vitamin D (Ergocalciferol) 1.25 MG (50000 UNIT) Caps capsule Commonly known as: DRISDOL Take 50,000 Units by mouth every Sunday. sunday        Follow-up Information     Ranelle OysterSwartz, Zachary T, MD Follow up.   Specialty: Physical Medicine and Rehabilitation Why: office will call you to arrange your appt (sent) Contact information: [address] [place] [address] [phone]         [facility] Follow up.   Why: Call this office on Wednesday to arrange hospital/stroke follow-up appointment Contact information: [address]     [address] [place] [phone]        Andreas Blowerabeza, Yuri M., MD Follow up.   Specialty: Internal Medicine Why: Call this office on Wednesday to make arrangements for hosptial follow-up Contact information: [address] [place] [address] [phone]         [facility] Follow up on 05/29/2021.   Why: May 29, 2021 at 11:00 am Contact information: [address] [place] [phone]        Wendall StadeNishan, Peter C, MD. Go on 05/18/2021.   Specialty: Cardiology Why: 2:30 pm Contact information: [address] [place] [address] [phone]                 Signed: Milinda AntisSandra J Kasch Borquez 04/18/2021, 3:45 PM

## 2021-03-30 NOTE — Progress Notes (Addendum)
Physical Therapy Session Note  Patient Details  Name: Joseph Ortega MRN: [id number] Date of Birth: Oct 10, 1955  Today's Date: 03/30/2021 PT Individual Time: 6073-7106 PT Individual Time Calculation (min): 57 min   Short Term Goals: Week 1:  PT Short Term Goal 1 (Week 1): Pt will perform be mobility with modA. PT Short Term Goal 1 - Progress (Week 1): Met PT Short Term Goal 2 (Week 1): Pt will perform sit to stand with maxA +1. PT Short Term Goal 2 - Progress (Week 1): Met PT Short Term Goal 3 (Week 1): Pt will perform bed to chair transfer with modA +1 consistently. PT Short Term Goal 3 - Progress (Week 1): Progressing toward goal PT Short Term Goal 4 (Week 1): Pt will ambulate x25' with modA +2 and LRAD. PT Short Term Goal 4 - Progress (Week 1): Met Week 2:  PT Short Term Goal 1 (Week 2): Pt will perform bed mobility consistently with minA. PT Short Term Goal 2 (Week 2): Pt will perform sit to stand consistently with minA. PT Short Term Goal 3 (Week 2): Pt will perform bed to chair consistently with minA. PT Short Term Goal 4 (Week 2): Pt will ambulate x50' with modA +1 and LRAD.   Skilled Therapeutic Interventions/Progress Updates:    Pt received sitting in WC and agreeable to PT. Attempted sit<>stand from [facility]. Noted heavy use of BLE anchoring on the WC and brakes unable to stabilize wheels. PT adjusted WC brakes.   Sit<>stand with max assist progressing to min assist and max cues for improved sequencing with exaggerated R lateral lean to allow sustained WB through midline throughout entire transfer. Repeated x 5 with min assist to facilitate anterior weight shift prior to initiating trunk extension on all bouts.   Gait training with RW with adapted L side hand splint x 73f with mod assist and +2 for WC follow. Max cues for sequencing to perform weight shift R prior to advancement of the LLE as well as improved sequencing of RW and hip extension in the L side in stance to prevent L  lateral lean. Pt noted to have improved step width on the LLE for 75% of walk on this day preventing L LOB.   Patient returned to room and left sitting in WC. PT adjusted Bil leg rests to improve pressure relief as ischial tuberosities and allow improved pelvic position in WC. Call bell in reach and all needs met.          Therapy Documentation Precautions:  Precautions Precautions: Fall Precaution Comments: Lt hemi with Lt sided edema Restrictions Weight Bearing Restrictions: No   Pain: Pain Assessment Pain Scale: 0-10 Pain Score: 0-No pain  Therapy/Group: Individual Therapy  ALorie Phenix1/02/2022, 6:12 PM

## 2021-03-30 NOTE — Progress Notes (Signed)
Occupational Therapy Session Note  Patient Details  Name: Joseph Ortega MRN: [id number] Date of Birth: 10/15/1955  Today's Date: 03/30/2021 OT Individual Time: 0855-1005 OT Individual Time Calculation (min): 70 min   Short Term Goals: Week 2:  OT Short Term Goal 1 (Week 2): Patient will use L UE as a stabilizer within grooming task with min questioning cues OT Short Term Goal 2 (Week 2): Patient will perform sit<>stand at the sink in preparation for BADL task with max A of 1 (without STedy)  Skilled Therapeutic Interventions/Progress Updates:    Pt greeted seated in wc and agreeable to OT treatment session foucsed on self-care retraining at shower level, L UE NMR, sit<>stands, and standing balance/endurance. Stedy used for transfer into shower seated on bariatric BSC. Pt needed min A to get to standing in Roff. Bathing completed using LH sponge to increase access to lower body and pt able to wash between legs using cut-out of commode. Hand over hand A to integrate L UE for neuro re-ed, but improved shoulder activation. OT issued [place] reacher for increased independence with donning underwear and pants. OT demonstrated technique and problem solved with pt using L hand to stabilize pants while using grabber. After practice, pt was able to thread pants with min A. Sit<>stand at the sink with mod A, then worked on L hand stabilizing while pulling up pants, mod A for balance. OT assisted with donning TED hose using friction reducing device. Pt left seated in wc with alarm belt on, call bell in reach, and needs met.   OT placed SAEBO e-stim on wrist extensors. SAEBO left on for 60 minutes. OT returned to remove SAEBO with skin intact and no adverse reactions.  [place] Stim One 330 pulse width 35 Hz pulse rate On 8 sec/ off 8 sec Ramp up/ down 2 sec Symmetrical Biphasic wave form  Max intensity 151m at 500 Ohm l  Therapy Documentation Precautions:  Precautions Precautions: Fall Precaution Comments:  Lt hemi with Lt sided edema Restrictions Weight Bearing Restrictions: No Pain: Pain Assessment Pain Scale: 0-10 Pain Score: 0-No pain  Therapy/Group: Individual Therapy  EValma Cava1/02/2022, 10:10 AM

## 2021-03-31 LAB — GLUCOSE, CAPILLARY
Glucose-Capillary: 97 mg/dL (ref 70–99)
Glucose-Capillary: 134 mg/dL — ABNORMAL HIGH (ref 70–99)
Glucose-Capillary: 134 mg/dL — ABNORMAL HIGH (ref 70–99)
Glucose-Capillary: 112 mg/dL — ABNORMAL HIGH (ref 70–99)

## 2021-03-31 NOTE — Progress Notes (Signed)
Occupational Therapy Session Note  Patient Details  Name: Zen Cedillos MRN: [id number] Date of Birth: 10-03-1955  Today's Date: 03/31/2021 OT Group Time: 1100-1200 OT Group Time Calculation (min): 60 min   Short Term Goals: Week 2:  OT Short Term Goal 1 (Week 2): Patient will use L UE as a stabilizer within grooming task with min questioning cues OT Short Term Goal 2 (Week 2): Patient will perform sit<>stand at the sink in preparation for BADL task with max A of 1 (without STedy)  Skilled Therapeutic Interventions/Progress Updates:  Pt participated in group session with a focus on stress mgmt, education on healthy coping strategies, and social interaction. Focus of session on providing coping strategies to manage new current level of function as a result of new diagnosis.  Session focus on breaking down stressors into daily hassles, major life stressors and life circumstances in an effort to allow pts to chunk their stressors into groups. Pt mostly quiet during session but did report feeling that session was helpful. Provided active listening, emotional support and therapeutic use of self. Offered education on factors that protect [place] against stress such as daily uplifts, healthy coping strategies and protective factors. Encouraged all group members to make an effort to actively recall one event from their day that was a daily uplift in an effort to protect their mindset from stressors as well as sharing this information with their caregivers to facilitate improved caregiver communication and decrease overall burden of care.  Issued pt handouts on healthy coping strategies to implement into routine. Pt transported back to room by RT.  Therapy Documentation Precautions:  Precautions Precautions: Fall Precaution Comments: Lt hemi with Lt sided edema Restrictions Weight Bearing Restrictions: No   Pain: no pain reported during session     Therapy/Group: Group Therapy  Barron Schmid 03/31/2021, 12:38 PM

## 2021-03-31 NOTE — Progress Notes (Signed)
Recreational Therapy Session Note  Patient Details  Name: Joseph Ortega MRN: [id number] Date of Birth: 01/25/1956 Today's Date: 03/31/2021 Time:  11-12 Pain: c/o LLE pain due to ace wrap from previous PT session, relief provided once ace wrap removed and pt returned to bed at end of the session. Skilled Therapeutic Interventions/Progress Updates: Pt participated in stress managment/coping group today.  Pt education/discussion focused on stress exploration including factors that contribute to stress, factors that protect against stress and potential coping strategies.  Coping strategies included deep breathing, progressive muscle relaxation, imagery & challenging irrational thoughts.  Handouts provided.   Therapy/Group: Group Therapy   Joseph Ortega 03/31/2021, 12:41 PM

## 2021-03-31 NOTE — Progress Notes (Signed)
PROGRESS NOTE   Subjective/Complaints:  Pt had a good night. Denies pain. Didn't take voltaren because knee didn't hurt last night  ROS: Patient denies fever, rash, sore throat, blurred vision, nausea, vomiting, diarrhea, cough, shortness of breath or chest pain, joint or back pain, headache, or mood change.    Objective:   No results found. Recent Labs    03/30/21 0546  WBC 5.6  HGB 12.3*  HCT 36.0*  PLT 191     Recent Labs    03/30/21 0546  NA 138  K 3.6  CL 101  CO2 29  GLUCOSE 128*  BUN 11  CREATININE 0.75  CALCIUM 8.5*      Intake/Output Summary (Last 24 hours) at 03/31/2021 1031 Last data filed at 03/31/2021 0700 Gross per 24 hour  Intake 540 ml  Output 925 ml  Net -385 ml        Physical Exam: Vital Signs Blood pressure (!) 151/79, pulse 69, temperature 98.2 F (36.8 C), temperature source Oral, resp. rate 18, height 6' 0.99" (1.854 m), weight 129 kg, SpO2 98 %.  Constitutional: No distress . Vital signs reviewed. HEENT: NCAT, EOMI, oral membranes moist Neck: supple Cardiovascular: RRR without murmur. No JVD    Respiratory/Chest: CTA Bilaterally without wheezes or rales. Normal effort    GI/Abdomen: BS +, non-tender, non-distended Ext: no clubbing, cyanosis, or edema Psych: pleasant and cooperative  Musculoskeletal: left hand edema better     tr 1+ LE edema B/L  Right knee with crepitus and mild effusion  Neurological:     Mental Status: He is alert.     Comments: Alert and oriented x 3. Normal insight and awareness. Intact Memory. Normal language and speech. Cranial nerve exam unremarkable except for LLQ quadrantanopsia ongoing. LUE 2- /5 pec, biceps and 1+ to 2-/5 wrist and finger flexion, trace wrist extension.    LLE 2+ to 3-/5 HE, KE and 1+/5 PF, 0-tr/5 DF. RUE and RLE grossly 5/5. Intact sensation to LT/pain       Assessment/Plan: 1. Functional deficits which require 3+ hours  per day of interdisciplinary therapy in a comprehensive inpatient rehab setting. Physiatrist is providing close team supervision and 24 hour management of active medical problems listed below. Physiatrist and rehab team continue to assess barriers to discharge/monitor patient progress toward functional and medical goals  Care Tool:  Bathing    Body parts bathed by patient: Left arm, Chest, Abdomen, Front perineal area, Right upper leg, Left upper leg, Face, Right lower leg, Left lower leg   Body parts bathed by helper: Right arm, Buttocks     Bathing assist Assist Level: Moderate Assistance - Patient 50 - 74% Assistive Device Comment: LH sponge   Upper Body Dressing/Undressing Upper body dressing   What is the patient wearing?: Pull over shirt    Upper body assist Assist Level: Minimal Assistance - Patient > 75%    Lower Body Dressing/Undressing Lower body dressing      What is the patient wearing?: Underwear/pull up, Pants     Lower body assist Assist for lower body dressing: Moderate Assistance - Patient 50 - 74% Assistive Device Comment: Consulting civil engineer  Activity did not occur (Clothing management and hygiene only): N/A (no void or bm)  Toileting assist Assist for toileting: Maximal Assistance - Patient 25 - 49%     Transfers Chair/bed transfer  Transfers assist     Chair/bed transfer assist level: Maximal Assistance - Patient 25 - 49%     Locomotion Ambulation   Ambulation assist   Ambulation activity did not occur: Safety/medical concerns  Assist level: Moderate Assistance - Patient 50 - 74% Assistive device: Other (comment) (R guardrail) Max distance: 25'   Walk 10 feet activity   Assist  Walk 10 feet activity did not occur: Safety/medical concerns  Assist level: Moderate Assistance - Patient - 50 - 74% Assistive device: Other (comment) (R guardrail)   Walk 50 feet activity   Assist Walk 50 feet with 2 turns activity  did not occur: Safety/medical concerns         Walk 150 feet activity   Assist Walk 150 feet activity did not occur: Safety/medical concerns         Walk 10 feet on uneven surface  activity   Assist Walk 10 feet on uneven surfaces activity did not occur: Safety/medical concerns         Wheelchair     Assist Is the patient using a wheelchair?: Yes Type of Wheelchair: Manual    Wheelchair assist level: Dependent - Patient 0% Max wheelchair distance: 150'    Wheelchair 50 feet with 2 turns activity    Assist        Assist Level: Dependent - Patient 0%   Wheelchair 150 feet activity     Assist      Assist Level: Dependent - Patient 0%   Blood pressure (!) 151/79, pulse 69, temperature 98.2 F (36.8 C), temperature source Oral, resp. rate 18, height 6' 0.99" (1.854 m), weight 129 kg, SpO2 98 %.    Medical Problem List and Plan: 1. 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 ?embolic. 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             -30 day outpatient cardiac event monitor recommended to r/o a fib              -patient may  shower             -ELOS/Goals: 04/15/21, supervision to min assist goals with PT and OT  -Continue CIR therapies including PT, OT, and SLP    2.  Antithrombotics: -DVT/anticoagulation:  Mechanical: Sequential compression devices, below knee Bilateral lower extremities Pharmaceutical: Lovenox             -antiplatelet therapy: aspirin 81 mg 3. Pain Management: Tylenol  -OA right knee with associated pain   -usually hurts after therapy, especially with WB   -advised him to try voltaren scheduled for now to see if there is benefit with activity associated right knee pain.  4. Mood: neuropsych eval today             -antipsychotic agents: n/a 5. Neuropsych: This patient is capable of  making decisions on his own behalf. 6. Skin/Wound Care: routine skin checks.  --Monitor BLE ulcerations. 7. Fluids/Electrolytes/Nutrition: eating well  -protein supp for hypoalbuminemia        8. Hypertension: continue valsartan and coreg.    -diet discussed 1/13 coreg increased to 12.5mg  with reasonable control  9. 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 Hgb A1c = 11.1             -monitor cbg's, adjust regimen as needed  Carb modified diet.   CBG (last 3)  Recent Labs    03/30/21 1640 03/30/21 2108 03/31/21 0539  GLUCAP 146* 140* 97               1/13 levemir increased to 39u bid--improved control at this dose--no change  10: Vitamin D deficiency: started on 1,000mg  daily supplement.  11: Hypothyroidism: continue Synthroid and recheck labs in 6 weeks 12: Peripheral neuropathy: continue gabapentin 300 mg twice daily at home 13: Herpes zoster of chest: continue Valtrex for total of 2-3 weeks 14: OSA: home BiPAP, 2L O2  at night and naps 15: History of depression: Cymbalta listed on home meds 16: Tobacco use: Offer nicotine patch and provide cessation counseling 17: EtOH use:   Magnesium level reviewed and was 2.2 on 12/21.  Counseling as appropriate 18: Obesity BMI 37.53: Dietary consultation. Provided list of foods that can help with weight loss.  19: Venous insufficiency, bilateral lower extremities: Has had treatment for venous stasis ulcers including Unna boots and follow-up at [facility] wound care center.  Currently with small superficial ulcerations bilateral lower legs.  He routinely wears compression stockings. Legs are stable  1/10 resumed lasix 1/123 edema has improved   LOS: 14 days A FACE TO FACE EVALUATION WAS PERFORMED  Ranelle Oyster 03/31/2021, 10:31 AM

## 2021-03-31 NOTE — Progress Notes (Addendum)
Occupational Therapy Session Note  Patient Details  Name: Joseph Ortega MRN: [id number] Date of Birth: Jul 31, 1955  Today's Date: 03/31/2021 Session 1 OT Individual Time: 9562-1308 OT Individual Time Calculation (min): 55 min  Session 2 OT Individual Time: 6578-4696 OT Individual Time Calculation (min): 54 min    Short Term Goals: Week 2:  OT Short Term Goal 1 (Week 2): Patient will use L UE as a stabilizer within grooming task with min questioning cues OT Short Term Goal 2 (Week 2): Patient will perform sit<>stand at the sink in preparation for BADL task with max A of 1 (without STedy)  Skilled Therapeutic Interventions/Progress Updates:    Pt greeted semi-reclined in bed and agreeable to OT treatment session. Pt completed bed mobility with HOB elevated,heavy use of bed rail, and supervision. Practiced slideboard transfer to weaker L side with OT assist for board placement, then min A to scoot across board. Cues for head/hips relationship. Pt brought to the sink in wc for LB bathing/dressing only per pt request. Addressed sit<>stands, L UE NMR with weight bearing, and LB dressing using reacher. Pt needed min A to get to standing with improved anterior weighshift. Min/mod A for dynamic balance with L UE support while washing buttocks. Supervision and increased time to thread pants, then mod A to stand to pull up pants at the sink. Standing balance/endurance to stand and brush teeth with pt demonstrating use of L UE as a stabilizer while applying toothpasete to toothbrush. L UE NMR with focus on wrist extension and elbow flexion. Pt with new wrist extension to neutral today! Pt left seated in wc with alarm belt on, call bell in reach, and needs met.   OT placed SAEBO e-stim on wrist extensors. SAEBO left on for 60 minutes. OT returned to remove SAEBO with skin intact and no adverse reactions.  [place]o Stim One 330 pulse width 35 Hz pulse rate On 8 sec/ off 8 sec Ramp up/ down 2 sec Symmetrical  Biphasic wave form  Max intensity 177m at 500 Ohm load  Pain: Pain Assessment Pain Scale: 0-10 Pain Score: 0-No pain  Session 2 Pt greeted semi-reclined in bed and agreeable to OT treatment session. Bed mobility with supervision and bed functions.  Mod A squat-pivot bed >drop arm wc on R side. L UE NMR with [employer] arm bike. Pt able to grasp L handle, but needed ace wrap to maintain grasp. Pt completed 8 mins with active assist form R UE, and 1 rest break. Continued L UE NMR with cup stacking activity breaking down each movement into a functional task. Pt able to actively pronate, but unable to supinate at this time. Focus on grasp and wrist extension with stacking. Focus on normal movement patterns and decrease of shoulder hike. Small ball rolling activity using only L UE for NMR. Pt with improved control of ball with repetition. Pt returned to room and left seated in wc with alarm belt on, call bell in reach, and needs met.   Therapy Documentation Precautions:  Precautions Precautions: Fall Precaution Comments: Lt hemi with Lt sided edema Restrictions Weight Bearing Restrictions: No  Pain:  Denies pain   Therapy/Group: Individual Therapy  EValma Cava1/13/2023, 3:24 PM

## 2021-03-31 NOTE — Progress Notes (Signed)
Physical Therapy Weekly Progress Note  Patient Details  Name: Joseph Ortega MRN: [id number] Date of Birth: 09-28-1955  Beginning of progress report period: March 24, 2021 End of progress report period: April 10, 2021 Today's Date: 03/31/2021 PT Individual Time: 1001-1058 PT Individual Time Calculation (min): 57 min   Patient has met 1 of 4 short term goals.  PT is progressing well toward mobility goals, improving independence with bed mobility, balance, transfers, and ambulation. Pt is still requiring modA at times when he does not sequence transfers optimally, but has performed sit to stand and bed to chair transfers with minA. Pt ambulating over 30' with RW and L hand splint but up to this point has still required +2 for WC follow due to unsteadiness and occasional slight buckling in L knee. Pt will benefit from family education prior to DC.  Patient continues to demonstrate the following deficits muscle weakness, decreased cardiorespiratoy endurance, decreased coordination and decreased motor planning, decreased attention to left, and decreased sitting balance, decreased standing balance, hemiplegia, and decreased balance strategies and therefore will continue to benefit from skilled PT intervention to increase functional independence with mobility.  Patient progressing toward long term goals..  Continue plan of care.  PT Short Term Goals Week 2:  PT Short Term Goal 1 (Week 2): Pt will perform bed mobility consistently with minA. PT Short Term Goal 1 - Progress (Week 2): Met PT Short Term Goal 2 (Week 2): Pt will perform sit to stand consistently with minA. PT Short Term Goal 2 - Progress (Week 2): Progressing toward goal PT Short Term Goal 3 (Week 2): Pt will perform bed to chair consistently with minA. PT Short Term Goal 3 - Progress (Week 2): Progressing toward goal PT Short Term Goal 4 (Week 2): Pt will ambulate x50' with modA +1 and LRAD. PT Short Term Goal 4 - Progress (Week 2):  Progressing toward goal Week 3:  PT Short Term Goal 1 (Week 3): Pt will consistently perform bed mobility with CGA. PT Short Term Goal 2 (Week 3): Pt will consistently perform sit to stand with minA. PT Short Term Goal 3 (Week 3): Pt will consistently perform bed to chair transfer with minA. PT Short Term Goal 4 (Week 3): Pt will ambulate x50' with modA +1 and LRAD.  Skilled Therapeutic Interventions/Progress Updates:  Ambulation/gait training;Community reintegration;DME/adaptive equipment instruction;Neuromuscular re-education;Psychosocial support;Stair training;UE/LE Strength taining/ROM;Wheelchair propulsion/positioning;Balance/vestibular training;Discharge planning;Functional electrical stimulation;Pain management;Skin care/wound management;Therapeutic Activities;UE/LE Coordination activities;Cognitive remediation/compensation;Disease management/prevention;Patient/family education;Functional mobility training;Splinting/orthotics;Therapeutic Exercise;Visual/perceptual remediation/compensation   Pt received seated in WC and agrees to therapy. No complaint of pain. WC transport to gym for time management. Pt performs sit to stand with modA and cues for hand placement and body mechanics. Pt ambulates x44' with RW and L splint, with modA +1 and +2 WC follow. PT provides manual facilitation of L glute contraction for increased stability during stance phase, as well as multimodal cueing for upright gaze to improve posture and balance. Pt also cued to decrease internal rotation and adduction of L lower extremity. During rest break, PT ace wraps L ankle to promote dorsiflexion and eversion. Pt then ambulates x58' with same assist and cueing, and with improved control of L lower extremity.   Pt performs squat pivot transfer to mat toward L side with cues for head hips relationship and adequate anterior weight shift. Pt requires modA to complete. Seated at edge of mat, pt performs multiple reps of sit to stand  to practice body mechanics and sequencing. PT cues pt  to place hand over hand pressure on L distal thigh for increased L lateral weight shifting and NM feedback though L hemibody. In standing, pt practices balance without upper extremity support, progressing to lateral weight shifting. Pt also performs x8 minisquats, cued to flex knees slightly and then power up to standing. PT provides intermittent minA for stability, and pt frequently has support from mat on backs of legs. Pt performs squat pivot to the R with minA and cues for sequencing. PT transport pt to ortho gym for group therapy. Left with rec therapist.  Therapy Documentation Precautions:  Precautions Precautions: Fall Precaution Comments: Lt hemi with Lt sided edema Restrictions Weight Bearing Restrictions: No  Therapy/Group: Individual Therapy  Breck Coons, PT, DPT 03/31/2021, 3:51 PM

## 2021-04-01 DIAGNOSIS — I610 Nontraumatic intracerebral hemorrhage in hemisphere, subcortical: Secondary | ICD-10-CM

## 2021-04-01 LAB — GLUCOSE, CAPILLARY
Glucose-Capillary: 138 mg/dL — ABNORMAL HIGH (ref 70–99)
Glucose-Capillary: 232 mg/dL — ABNORMAL HIGH (ref 70–99)
Glucose-Capillary: 266 mg/dL — ABNORMAL HIGH (ref 70–99)
Glucose-Capillary: 154 mg/dL — ABNORMAL HIGH (ref 70–99)

## 2021-04-01 NOTE — Progress Notes (Signed)
PROGRESS NOTE   Subjective/Complaints:  No issues overnite , had therapy already Speech without dysarthria   ROS: Patient deniesCP, SOB, N/V/D.    Objective:   No results found. Recent Labs    03/30/21 0546  WBC 5.6  HGB 12.3*  HCT 36.0*  PLT 191      Recent Labs    03/30/21 0546  NA 138  K 3.6  CL 101  CO2 29  GLUCOSE 128*  BUN 11  CREATININE 0.75  CALCIUM 8.5*       Intake/Output Summary (Last 24 hours) at 04/01/2021 0917 Last data filed at 04/01/2021 0615 Gross per 24 hour  Intake 360 ml  Output 900 ml  Net -540 ml         Physical Exam: Vital Signs Blood pressure (!) 166/86, pulse 73, temperature 97.8 F (36.6 C), temperature source Oral, resp. rate 18, height 6' 0.99" (1.854 m), weight 129 kg, SpO2 96 %.   General: No acute distress Mood and affect are appropriate Heart: Regular rate and rhythm no rubs murmurs or extra sounds Lungs: Clear to auscultation, breathing unlabored, no rales or wheezes Abdomen: Positive bowel sounds, soft nontender to palpation, nondistended Extremities: No clubbing, cyanosis, or edema Skin: No evidence of breakdown, no evidence of rash  Oriented x 3  Psych: pleasant and cooperative  Musculoskeletal: left hand edema better     tr 1+ LE edema B/L  Right knee with crepitus and mild effusion  Neurological:     Mental Status: He is alert.     Comments: Alert and oriented x 3. Normal insight and awareness. Intact Memory. Normal language and speech. Cranial nerve exam unremarkable except for LLQ quadrantanopsia ongoing. LUE 2- /5 pec, biceps and 1+ to 2-/5 wrist and finger flexion, trace wrist extension.    LLE 2+ to 3-/5 HE, KE and 1+/5 PF, 0-tr/5 DF. RUE and RLE grossly 5/5.unchanged       Assessment/Plan: 1. Functional deficits which require 3+ hours per day of interdisciplinary therapy in a comprehensive inpatient rehab setting. Physiatrist is providing  close team supervision and 24 hour management of active medical problems listed below. Physiatrist and rehab team continue to assess barriers to discharge/monitor patient progress toward functional and medical goals  Care Tool:  Bathing    Body parts bathed by patient: Left arm, Chest, Abdomen, Front perineal area, Right upper leg, Left upper leg, Face, Right lower leg, Left lower leg   Body parts bathed by helper: Right arm, Buttocks     Bathing assist Assist Level: Moderate Assistance - Patient 50 - 74% Assistive Device Comment: LH sponge   Upper Body Dressing/Undressing Upper body dressing   What is the patient wearing?: Pull over shirt    Upper body assist Assist Level: Minimal Assistance - Patient > 75%    Lower Body Dressing/Undressing Lower body dressing      What is the patient wearing?: Underwear/pull up, Pants     Lower body assist Assist for lower body dressing: Moderate Assistance - Patient 50 - 74% Assistive Device Comment: Consulting civil engineer Activity did not occur (Clothing management and hygiene only): N/A (no void or bm)  Toileting assist Assist for toileting: Maximal Assistance - Patient 25 - 49%     Transfers Chair/bed transfer  Transfers assist     Chair/bed transfer assist level: Maximal Assistance - Patient 25 - 49%     Locomotion Ambulation   Ambulation assist   Ambulation activity did not occur: Safety/medical concerns  Assist level: Moderate Assistance - Patient 50 - 74% Assistive device: Other (comment) (R guardrail) Max distance: 25'   Walk 10 feet activity   Assist  Walk 10 feet activity did not occur: Safety/medical concerns  Assist level: Moderate Assistance - Patient - 50 - 74% Assistive device: Other (comment) (R guardrail)   Walk 50 feet activity   Assist Walk 50 feet with 2 turns activity did not occur: Safety/medical concerns         Walk 150 feet activity   Assist Walk 150 feet  activity did not occur: Safety/medical concerns         Walk 10 feet on uneven surface  activity   Assist Walk 10 feet on uneven surfaces activity did not occur: Safety/medical concerns         Wheelchair     Assist Is the patient using a wheelchair?: Yes Type of Wheelchair: Manual    Wheelchair assist level: Dependent - Patient 0% Max wheelchair distance: 150'    Wheelchair 50 feet with 2 turns activity    Assist        Assist Level: Dependent - Patient 0%   Wheelchair 150 feet activity     Assist      Assist Level: Dependent - Patient 0%   Blood pressure (!) 166/86, pulse 73, temperature 97.8 F (36.6 C), temperature source Oral, resp. rate 18, height 6' 0.99" (1.854 m), weight 129 kg, SpO2 96 %.    Medical Problem List and Plan: 1. 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 ?embolic. 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             -30 day outpatient cardiac event monitor recommended to r/o a fib              -patient may  shower             -ELOS/Goals: 04/15/21, supervision to min assist goals with PT and OT  -Continue CIR therapies including PT, OT, and SLP    2.  Antithrombotics: -DVT/anticoagulation:  Mechanical: Sequential compression devices, below knee Bilateral lower extremities Pharmaceutical: Lovenox             -antiplatelet therapy: aspirin 81 mg 3. Pain Management: Tylenol  -OA right knee with associated pain   -usually hurts after therapy, especially with WB   -advised him to try voltaren scheduled for now to see if there is benefit with activity associated right knee pain.  4. Mood: neuropsych eval today             -antipsychotic agents: n/a 5. Neuropsych: This patient is capable of making decisions on his own behalf. 6. Skin/Wound Care: routine skin checks.  --Monitor BLE  ulcerations. 7. Fluids/Electrolytes/Nutrition: eating well  -protein supp for hypoalbuminemia        8. Hypertension: continue valsartan and coreg.    -diet discussed 1/13 coreg increased to 12.5mg  with reasonable control  9. 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 Hgb A1c = 11.1             -  monitor cbg's, adjust regimen as needed  Carb modified diet.   CBG (last 3)  Recent Labs    03/31/21 1632 03/31/21 2052 04/01/21 0602  GLUCAP 134* 112* 138*          1/14 controlled       1/13 levemir increased to 39u bid--improved control at this dose--no change  10: Vitamin D deficiency: started on 1,000mg  daily supplement.  11: Hypothyroidism: continue Synthroid and recheck labs in 6 weeks 12: Peripheral neuropathy: continue gabapentin 300 mg twice daily at home 13: Herpes zoster of chest: continue Valtrex for total of 2-3 weeks 14: OSA: home BiPAP, 2L O2  at night and naps 15: History of depression: Cymbalta listed on home meds 16: Tobacco use: Offer nicotine patch and provide cessation counseling 17: EtOH use:   Magnesium level reviewed and was 2.2 on 12/21.  Counseling as appropriate 18: Obesity BMI 37.53: Dietary consultation. Provided list of foods that can help with weight loss.  19: Venous insufficiency, bilateral lower extremities: Has had treatment for venous stasis ulcers including Unna boots [facility]  Currently with small superficial ulcerations bilateral lower legs.  He routinely wears compression stockings. Legs are stable  1/10 resumed lasix 1/123 edema has improved   LOS: 15 days A FACE TO FACE EVALUATION WAS PERFORMED  Erick Colacendrew E Lindsee Labarre 04/01/2021, 9:17 AM

## 2021-04-01 NOTE — Progress Notes (Signed)
Occupational Therapy Session Note  Patient Details  Name: Joseph Ortega MRN: [id number] Date of Birth: 1955/09/14  Today's Date: 04/01/2021 OT Individual Time: 8786-7672 OT Individual Time Calculation (min): 75 min    Short Term Goals: Week 2:  OT Short Term Goal 1 (Week 2): Patient will use L UE as a stabilizer within grooming task with min questioning cues OT Short Term Goal 2 (Week 2): Patient will perform sit<>stand at the sink in preparation for BADL task with max A of 1 (without STedy)  Skilled Therapeutic Interventions/Progress Updates:    Pt received supine with no c/o pain. L resting hand splint and PRAFO removed. Bed mobility to EOB with min A, requiring cueing for breathe support and technique. Min cueing for LUE placement during sit > stand from EOB. Heavy max A to stand with L lean initially. L hand on orthosis for support on RW. Mod-max A for functional mobility to the bathroom with +2 present for safety. Pt required max A for toileting tasks. Large BM voided. Pt stood with mod A using RW and grab bar. He required max A for clothing management. Heavy mod for stand pivot to the w/c from the bariatric BSC. Oral care and hand hygiene performed at the sink with set up assist only. Pt was taken via w/c to the therapy gym. 10+ attempts to stand from the w/c with max A, pt very motivated to continue trying. Pt was eventually able to stand with max A and pivot to the mat. Heavy heavy assist from OT. Pt then completed 3x10 blocked practice glute/quad activation with mini stands from EOM to really focus in on body mechanics of standing. Facilitation provided to weightbear through the LUE and LLE. Pt stood again and completed a stand pivot to the w/c with mod cueing for LLE management glute activation to shift his pelvis more forward. He was left sitting up in his room with all needs met, chair alarm set.    Saebo Stim One was placed on pt's L middle and anterior deltoid to promote muscle  activation and joint stabilization. 60 min unattended, OT returned to remove saebo and pt had no adverse skin reactions or pain.  330 pulse width 35 Hz pulse rate On 8 sec/ off 8 sec Ramp up/ down 2 sec Symmetrical Biphasic wave form  Max intensity 139m at 500 Ohm load   Therapy Documentation Precautions:  Precautions Precautions: Fall Precaution Comments: Lt hemi with Lt sided edema Restrictions Weight Bearing Restrictions: No    Therapy/Group: Individual Therapy  SCurtis Sites1/14/2023, 7:20 AM

## 2021-04-02 LAB — GLUCOSE, CAPILLARY
Glucose-Capillary: 136 mg/dL — ABNORMAL HIGH (ref 70–99)
Glucose-Capillary: 169 mg/dL — ABNORMAL HIGH (ref 70–99)
Glucose-Capillary: 115 mg/dL — ABNORMAL HIGH (ref 70–99)
Glucose-Capillary: 156 mg/dL — ABNORMAL HIGH (ref 70–99)

## 2021-04-02 NOTE — Progress Notes (Signed)
Placed patient on CPAP via FFM with 2L O2 bleed in.

## 2021-04-02 NOTE — Progress Notes (Signed)
Occupational Therapy Session Note  Patient Details  Name: Joseph Ortega MRN: [id number] Date of Birth: [birth year]  Today's Date: 04/02/2021 OT Individual Time: 8367-2550 OT Individual Time Calculation (min): 74 min    Short Term Goals: Week 1:  OT Short Term Goal 1 (Week 1): Pt will complete BSC transfer with +2 assist to progress with OOB toileting OT Short Term Goal 1 - Progress (Week 1): Met OT Short Term Goal 2 (Week 1): Pt will complete sit<stand in Stedy with no more than Mod A during 2 consecutive ADL sessions OT Short Term Goal 2 - Progress (Week 1): Met OT Short Term Goal 3 (Week 1): Pt will assist with 1/3 components of toileting with no more than Mod balance assist OT Short Term Goal 3 - Progress (Week 1): Met Week 2:  OT Short Term Goal 1 (Week 2): Patient will use L UE as a stabilizer within grooming task with min questioning cues OT Short Term Goal 2 (Week 2): Patient will perform sit<>stand at the sink in preparation for BADL task with max A of 1 (without STedy)  Skilled Therapeutic Interventions/Progress Updates:  Patient met lying supine in bed in agreement with OT treatment session. 0/10 pain reported at rest and with activity. LLE PRAFO and L resting hand splint removed prior to bed mobility. Noted continued LUE/LLE edema. Patient able to advance BLE from bed surface toward EOB without external assist. HOB elevated and heavy use of bed rail with some external assist to elevate trunk. Cues to prevent valsalva maneuver. Stedy for transfer to commode in bathroom as +2 assist unavailable at this time. Patient completed 3/3 parts of toileting task with ability to manage clothing with external assist on L and assist for hygiene management. UB bathing/dressing with Min A and LB bathing/dressing with Mod-Max A. Stedy for transfer to wc for grooming seated at sink level. Patient with fair attention to LUE placing on sink surface without cueing. Patient then ambulated ~42f in hallways  with Max A, RW and NT providing chair follow. Sit to stand with rocking technique from wc with cues for hand/foot placement and Max A and stand-pivot to mat table in ortho gym with Mod-Max A. Blocked practice for sit to stand transfers from elevated mat table and static standing tolerance in prep for ADLs. Total A for wc transport back to recliner. Mod A squat-pivot wc>EOB and Min A for return to supine. Session concluded with patient lying supine in bed with call bell within reach, bed alarm activated and all needs met.   Therapy Documentation Precautions:  Precautions Precautions: Fall Precaution Comments: Lt hemi with Lt sided edema Restrictions Weight Bearing Restrictions: No General:    Therapy/Group: Individual Therapy  Yitzchok Carriger R Howerton-Davis 04/02/2021, 6:44 AM

## 2021-04-03 LAB — BASIC METABOLIC PANEL
Anion gap: 7 (ref 5–15)
BUN: 9 mg/dL (ref 8–23)
CO2: 30 mmol/L (ref 22–32)
Calcium: 8.7 mg/dL — ABNORMAL LOW (ref 8.9–10.3)
Chloride: 102 mmol/L (ref 98–111)
Creatinine, Ser: 0.74 mg/dL (ref 0.61–1.24)
GFR, Estimated: >60 mL/min (ref >60)
Glucose, Bld: 118 mg/dL — ABNORMAL HIGH (ref 70–99)
Potassium: 3.4 mmol/L — ABNORMAL LOW (ref 3.5–5.1)
Sodium: 139 mmol/L (ref 135–145)

## 2021-04-03 LAB — GLUCOSE, CAPILLARY
Glucose-Capillary: 138 mg/dL — ABNORMAL HIGH (ref 70–99)
Glucose-Capillary: 129 mg/dL — ABNORMAL HIGH (ref 70–99)
Glucose-Capillary: 117 mg/dL — ABNORMAL HIGH (ref 70–99)
Glucose-Capillary: 135 mg/dL — ABNORMAL HIGH (ref 70–99)

## 2021-04-03 MED ORDER — POTASSIUM CHLORIDE CRYS ER 20 MEQ PO TBCR
20.0000 meq | EXTENDED_RELEASE_TABLET | Freq: Every day | ORAL | Status: DC
  Administered 2021-04-03 – 2021-04-15 (×13): 20 meq via ORAL
  Filled 2021-04-03 (×13): qty 1

## 2021-04-03 NOTE — Progress Notes (Addendum)
Occupational Therapy Weekly Progress Note  Patient Details  Name: Joseph Ortega MRN: [id number] Date of Birth: 12-11-1955  Beginning of progress report period: March 18, 2021 End of progress report period: April 03, 2021  Today's Date: 04/03/2021 OT Individual Time: 6256-3893 OT Individual Time Calculation (min): 70 min    Patient has met 2 of 2 short term goals.  Patient is progressing towards OT goals. Sit<>stands have improved this week at a min/mod A overall at the sink, but still needs max A at times when fatigued and when using a RW. Transfers are still progressing. He can use a slideboard for transfers at a min A level, but needs mod/max A for stand-pivots to the weaker L side and min/mod A for stand-pivots to the stronger R side. Patient has learned to use a reacher to assist with lower body dressing tasks and is now able to thread pants and underwear with supervision/set-up, but still needs assistance to pull pants up over hips. L UE function is improving with pt able to achieve some wrist extension and improved finger flexion working towards more grasp. Patient is extremely motivated and working really hard in all of his therapies. Continue current POC at this time.   Patient continues to demonstrate the following deficits: muscle weakness, decreased cardiorespiratoy endurance, abnormal tone, unbalanced muscle activation, ataxia, and decreased coordination, decreased attention to left, and decreased sitting balance, decreased standing balance, decreased postural control, hemiplegia, and decreased balance strategies and therefore will continue to benefit from skilled OT intervention to enhance overall performance with BADL and Reduce care partner burden.  Patient progressing toward long term goals..  Continue plan of care.  OT Short Term Goals Week 2:  OT Short Term Goal 1 (Week 2): Patient will use L UE as a stabilizer within grooming task with min questioning cues OT Short Term  Goal 1 - Progress (Week 2): Met OT Short Term Goal 2 (Week 2): Patient will perform sit<>stand at the sink in preparation for BADL task with max A of 1 (without STedy) OT Short Term Goal 2 - Progress (Week 2): Met Week 3:  OT Short Term Goal 1 (Week 3): Patient will complete toilet transfer with MOd A OT Short Term Goal 2 (Week 3): Patient will complete 1 step of toileting task OT Short Term Goal 3 (Week 3): Patient will stand at the sink with consitent mod A of 1 in preparation for BADL tasks.  Skilled Therapeutic Interventions/Progress Updates:    Pt greeted semi-reclined in bed and agreeable to OT treatment session. Pt reported having a rough weekend as he was sick and not feeling well. Pt stated he is feeling much better today. Pt reported need to go to the bathroom and wanted to shower. Stedy used today with pt able to perform sit<>stands in [place] with min A. Pt voided bladder and had small BM in commode. Worked on standing and reaching for toileting in [place] with pt able to perform hygiene today with [place] support. Stedy into shower onto bariatric BSC. Pt able to use long handled sponge to wash feet and buttocks using cutout of commode. L UE NMR with min A to get L UE to R shoulder to push and pull for NMR. Dressing tasks seated on wc with pt able to thread pants and underwear using reacher, L UE as a stabilizer and increased time. Sit<>stands at the sink with mod A and no AD. Verbal cues for anterior weight shifting prior to stand. Blocked practice for UB hemi-dressing  technique as pt getting confused about technique, so we found a different way. Pt reported fatigue and requested to return to bed. Min A squat-pivot back to bed using bed rails. OT assist to lift L LE back into bed. Pt left semi-reclined in bed with bed alarm on, call bell in reach, and needs met.   OT placed SAEBO e-stim on wrist extensors.  SAEBO left on for 60 minutes. OT returned to remove SAEBO with skin intact and no adverse  reactions.  Saebo Stim One 330 pulse width 35 Hz pulse rate On 8 sec/ off 8 sec Ramp up/ down 2 sec Symmetrical Biphasic wave form  Max intensity 177m at 500 Ohm load   Therapy Documentation Precautions:  Precautions Precautions: Fall Precaution Comments: Lt hemi with Lt sided edema Restrictions Weight Bearing Restrictions: No Pain: Pain Assessment Pain Score: 0-No pain   Therapy/Group: Individual Therapy  EValma Cava1/16/2023, 2:28 PM

## 2021-04-03 NOTE — Progress Notes (Signed)
Occupational Therapy Note  Patient Details  Name: Joseph Ortega MRN: [id number] Date of Birth: [birth year]  Today's Date: 04/03/2021 OT Missed Time: 75 Minutes Missed Time Reason: Patient ill (comment)  OT greeted patient at 1300 for scheduled therapy session. Pt with emesis bag on chest and eyes closed. Pt reported nausea and not felling well. OT let patient rest and returned at 1416, but patient still ill and reported he can't participate at this time. Patient is always very eager to participate in therapy, so he is really not feeling well if he declines. Patient left semi-reclined in bed with needs met and OT will follow up per plan of care.    [name] 04/03/2021, 2:44 PM

## 2021-04-03 NOTE — Progress Notes (Signed)
PROGRESS NOTE   Subjective/Complaints:  Didn't feel that well yesterday. Not sure what it was. Feels more like himself yesterday. Has been moving bowels regularly. Pain controlled  ROS: Patient denies fever, rash, sore throat, blurred vision, nausea, vomiting, diarrhea, cough, shortness of breath or chest pain, joint or back pain, headache, or mood change.    Objective:   No results found. No results for input(s): WBC, HGB, HCT, PLT in the last 72 hours.   No results for input(s): NA, K, CL, CO2, GLUCOSE, BUN, CREATININE, CALCIUM in the last 72 hours.    Intake/Output Summary (Last 24 hours) at 04/03/2021 1010 Last data filed at 04/03/2021 0928 Gross per 24 hour  Intake 1152 ml  Output 1825 ml  Net -673 ml        Physical Exam: Vital Signs Blood pressure (!) 166/78, pulse 70, temperature 98.1 F (36.7 C), resp. rate 14, height 6' 0.99" (1.854 m), weight 129 kg, SpO2 91 %.   Constitutional: No distress . Vital signs reviewed. HEENT: NCAT, EOMI, oral membranes moist Neck: supple Cardiovascular: RRR without murmur. No JVD    Respiratory/Chest: CTA Bilaterally without wheezes or rales. Normal effort    GI/Abdomen: BS +, non-tender, non-distended Ext: no clubbing, cyanosis, tr edema Psych: pleasant and cooperative  Skin: No evidence of breakdown, no evidence of rash  Musculoskeletal: left hand edema better     tr   LE edema B/L  Right knee with crepitus and mild effusion  Neurological:     Mental Status: He is alert.     Comments: Alert and oriented x 3. Normal insight and awareness. Intact Memory. Normal language and speech. Cranial nerve exam unremarkable except for LLQ quadrantanopsia ongoing. LUE 2- /5 pec, biceps and 1+ /5 wrist and finger flexion, trace wrist extension.    LLE 2+ to 3-/5 HE, KE and 1+/5 PF, 0-tr/5 DF. RUE and RLE grossly 5/5.unchanged       Assessment/Plan: 1. Functional deficits which  require 3+ hours per day of interdisciplinary therapy in a comprehensive inpatient rehab setting. Physiatrist is providing close team supervision and 24 hour management of active medical problems listed below. Physiatrist and rehab team continue to assess barriers to discharge/monitor patient progress toward functional and medical goals  Care Tool:  Bathing    Body parts bathed by patient: Left arm, Chest, Abdomen, Front perineal area, Right upper leg, Left upper leg, Face, Right lower leg, Left lower leg   Body parts bathed by helper: Right arm, Buttocks     Bathing assist Assist Level: Moderate Assistance - Patient 50 - 74% Assistive Device Comment: LH sponge   Upper Body Dressing/Undressing Upper body dressing   What is the patient wearing?: Pull over shirt    Upper body assist Assist Level: Set up assist    Lower Body Dressing/Undressing Lower body dressing      What is the patient wearing?: Underwear/pull up, Pants     Lower body assist Assist for lower body dressing: Moderate Assistance - Patient 50 - 74% Assistive Device Comment: Consulting civil engineer Activity did not occur (Clothing management and hygiene only): N/A (no void or bm)  Toileting assist  Assist for toileting: Maximal Assistance - Patient 25 - 49%     Transfers Chair/bed transfer  Transfers assist     Chair/bed transfer assist level: Maximal Assistance - Patient 25 - 49%     Locomotion Ambulation   Ambulation assist   Ambulation activity did not occur: Safety/medical concerns  Assist level: Moderate Assistance - Patient 50 - 74% Assistive device: Other (comment) (R guardrail) Max distance: 25'   Walk 10 feet activity   Assist  Walk 10 feet activity did not occur: Safety/medical concerns  Assist level: Moderate Assistance - Patient - 50 - 74% Assistive device: Other (comment) (R guardrail)   Walk 50 feet activity   Assist Walk 50 feet with 2 turns activity did  not occur: Safety/medical concerns         Walk 150 feet activity   Assist Walk 150 feet activity did not occur: Safety/medical concerns         Walk 10 feet on uneven surface  activity   Assist Walk 10 feet on uneven surfaces activity did not occur: Safety/medical concerns         Wheelchair     Assist Is the patient using a wheelchair?: Yes Type of Wheelchair: Manual    Wheelchair assist level: Dependent - Patient 0% Max wheelchair distance: 150'    Wheelchair 50 feet with 2 turns activity    Assist        Assist Level: Dependent - Patient 0%   Wheelchair 150 feet activity     Assist      Assist Level: Dependent - Patient 0%   Blood pressure (!) 166/78, pulse 70, temperature 98.1 F (36.7 C), resp. rate 14, height 6' 0.99" (1.854 m), weight 129 kg, SpO2 91 %.    Medical Problem List and Plan: 1. 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 ?embolic. 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             -30 day outpatient cardiac event monitor recommended to r/o a fib              -patient may  shower             -ELOS/Goals: 04/15/21, supervision to min assist goals with PT and OT  -Continue CIR therapies including PT, OT, and SLP     2.  Antithrombotics: -DVT/anticoagulation:  Mechanical: Sequential compression devices, below knee Bilateral lower extremities Pharmaceutical: Lovenox             -antiplatelet therapy: aspirin 81 mg 3. Pain Management: Tylenol  -OA right knee with associated pain   -usually hurts after therapy, especially with WB   -voltaren gel 4. Mood: neuropsych has seen him               -antipsychotic agents: n/a 5. Neuropsych: This patient is capable of making decisions on his own behalf. 6. Skin/Wound Care: routine skin checks.  --Monitor BLE ulcerations. 7.  Fluids/Electrolytes/Nutrition: eating well  -protein supp for hypoalbuminemia        8. Hypertension: continue valsartan and coreg.    -diet discussed 1/13 coreg increased to 12.5mg  with reasonable control  9. 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 Hgb A1c = 11.1             -monitor cbg's, adjust regimen as needed  Carb modified diet.   CBG (last 3)  Recent Labs  04/02/21 1644 04/02/21 2130 04/03/21 0636  GLUCAP 115* 156* 138*         1/16 controlled       1/13 levemir increased to 39u bid--improved control at this dose--no change  10: Vitamin D deficiency: started on 1,000mg  daily supplement.  11: Hypothyroidism: continue Synthroid and recheck labs in 6 weeks 12: Peripheral neuropathy: continue gabapentin 300 mg twice daily at home 13: Herpes zoster of chest: continue Valtrex for total of 2-3 weeks 14: OSA: home BiPAP, 2L O2  at night and naps 15: History of depression: Cymbalta listed on home meds 16: Tobacco use: Offer nicotine patch and provide cessation counseling 17: EtOH use:   Magnesium level reviewed and was 2.2 on 12/21.  Counseling as appropriate 18: Obesity BMI 37.53: Dietary consultation. Provided list of foods that can help with weight loss.  19: Venous insufficiency, bilateral lower extremities: Has had treatment for venous stasis ulcers including Unna boots and [facility].  Currently with small superficial ulcerations bilateral lower legs.  He routinely wears compression stockings. Legs are stable  1/10 resumed lasix 1/16 edema has improved   LOS: 17 days A FACE TO FACE EVALUATION WAS PERFORMED  Ranelle OysterZachary T Rosealie Reach 04/03/2021, 10:10 AM

## 2021-04-03 NOTE — Progress Notes (Signed)
Physical Therapy Session Note  Patient Details  Name: Joseph Ortega MRN: [id number] Date of Birth: Sep 09, 1955  Today's Date: 04/03/2021 PT Individual Time: 5400-8676 PT Individual Time Calculation (min): 70 min   Short Term Goals: Week 3:  PT Short Term Goal 1 (Week 3): Pt will consistently perform bed mobility with CGA. PT Short Term Goal 2 (Week 3): Pt will consistently perform sit to stand with minA. PT Short Term Goal 3 (Week 3): Pt will consistently perform bed to chair transfer with minA. PT Short Term Goal 4 (Week 3): Pt will ambulate x50' with modA +1 and LRAD.   Skilled Therapeutic Interventions/Progress Updates:      Pt supine in bed to start - agreeable to PT tx without reports of pain. Reports some fatigue from getting a shower with OT, earlier. Supine<>sit with supervision with heavy reliance of bed rail for trunk support. Donned shoes with totalA for time. Completed sit<>stand with no AD from low EOB height with modA for powering to rise and assistance for L foot placement to encourage knee flexion. Required modA for balance while transferring to his L side.   Transported in w.c to day room rehab gym to focus remainder of session on gait training. Ace wrapped L ankle for DF assist. Sit<>stand to RW with modA and requries assist for placing LUE into RW splint. Ambulated 90ft with min/modA and RW with +2 assist for w/c follow for safety. Primary gait deficits include L foot ER, fading from reciprocal to step-to gait after ~76ft, trendelenburg on L, L and forward truncal lean, and decreased gait speed. Pt demonstrating ability to adequately clear and initiate L hip/knee flexion in swing phase with some mild genu recurvatum during terminal stance. Pt then assisted onto Treadmill using LiteGait for minimal BWS. Donned/doffed harness in sitting for safety and +2 assist for stepping on/off treadmill.   Trial 1) 2 min, 23ft, 0.57mph  Trial 2) 72min30sec, 37ft, 0.37mph  Trial 3) ,  60ft, 0.72mph  Ace wrapped LUE to handgrip for BUE support and to promote Weight bearing and limit distractions. Also used medium resistance bungee to L hip to assist with tactile feedback for L hip flexor during swing. PT facilitating primarily lateral weight shifting and 'heel-toe' rocking on L during contact phase of gait. VC for keeping L foot forwards, reducing ER from compensating with adductors, and lengthening stride on L>R. Pt with seated rest breaks b/w gait trials as gait distance limited primarily by fatigue.   Pt returned to his room in w/c and assisted back to bed using RW for stand<>pivot transfer with modA - pt with difficulty sequencing and had LOB posteriorly with difficulty self correcting. Able to complete side steps at EOB x3 with modA and RW, facilitating lateral weight shift and assisting with RW management. Required minA for LLE management during sit>supine transition. Concluded session in bed with bed alarm on, all immediate needs within reach.   Therapy Documentation Precautions:  Precautions Precautions: Fall Precaution Comments: Lt hemi with Lt sided edema Restrictions Weight Bearing Restrictions: No General:    Therapy/Group: Individual Therapy  Joseph Ortega Joseph Ortega 04/03/2021, 11:22 AM

## 2021-04-03 NOTE — Progress Notes (Signed)
Informed by RN that patient is experiencing some mild legs cramping and he told her he usually takes a potassium supplement when taking Lasix. Checked BMET and K+ is 3.4. Started Klor-con 20 mEq daily.

## 2021-04-04 LAB — GLUCOSE, CAPILLARY
Glucose-Capillary: 100 mg/dL — ABNORMAL HIGH (ref 70–99)
Glucose-Capillary: 130 mg/dL — ABNORMAL HIGH (ref 70–99)
Glucose-Capillary: 169 mg/dL — ABNORMAL HIGH (ref 70–99)
Glucose-Capillary: 231 mg/dL — ABNORMAL HIGH (ref 70–99)

## 2021-04-04 MED ORDER — METOCLOPRAMIDE HCL 5 MG PO TABS
5.0000 mg | ORAL_TABLET | Freq: Three times a day (TID) | ORAL | Status: DC
  Administered 2021-04-04 – 2021-04-18 (×43): 5 mg via ORAL
  Filled 2021-04-04 (×40): qty 1

## 2021-04-04 NOTE — Progress Notes (Signed)
Occupational Therapy Session Note  Patient Details  Name: Joseph Ortega MRN: [id number] Date of Birth: 12/06/1955  Today's Date: 04/04/2021 OT Individual Time: 4580-9983 OT Individual Time Calculation (min): 70 min    Short Term Goals: Week 3:  OT Short Term Goal 1 (Week 3): Patient will complete toilet transfer with MOd A OT Short Term Goal 2 (Week 3): Patient will complete 1 step of toileting task OT Short Term Goal 3 (Week 3): Patient will stand at the sink with consitent mod A of 1 in preparation for BADL tasks.  Skilled Therapeutic Interventions/Progress Updates:    Pt greeted semi-reclined in bed stating he felt better this morning and agreeable to OT treatment session. Pt completed bed mobility with HOB elevated, bed rails and CGA. Squat-pivot transfer from bed to wc to stronger side with mod A. Pt brought down to laundry room and worked on sit<>stand and standing balance within functional laundry task. Pt able to use L UE to stabilize and weight bear through while loading top loading washer. Pt needed verbal cues for anterior weight shift and 3 trials, but eventually able to stand with mod A. Pt brought to therapy gym for blocked practice with transfers. Practiced slideboard transfers multiple times with assistance for board placement, but min A for the transfer. Focus on weight shifting and head/hips relationship. Pt then practiced sit<>stands with RW and L hand splint. Pt needed 3 trials again to stand, but able to achieve with mod A, then min/mod A to pivot. Pt brought into gravity eliminated sidelying position for L UE NMR focused on shoulder flex/ext, elbow flex/ext, and wrist flex/ext. OT re-applied kinesiotape to L hand for edema management. Pt performed stand-pivot with RW back to wc with min  A and 2 trials to stand and min A to pivot this time! Pt returned to room and  OT provided min A to place urinal for continent void. Pt left seated in wc with MD present and needs met.  OT  placed SAEBO e-stim on wrist extensors. SAEBO left on for 60 minutes. OT returned to remove SAEBO with skin intact and no adverse reactions.  Saebo Stim One 330 pulse width 35 Hz pulse rate On 8 sec/ off 8 sec Ramp up/ down 2 sec Symmetrical Biphasic wave form  Max intensity 153m at 500 Ohm load   Therapy Documentation Precautions:  Precautions Precautions: Fall Precaution Comments: Lt hemi with Lt sided edema Restrictions Weight Bearing Restrictions: No Pain:  Denies pain   Therapy/Group: Individual Therapy  EValma Cava1/17/2023, 8:53 AM

## 2021-04-04 NOTE — Discharge Instructions (Addendum)
Inpatient Rehab Discharge Instructions  Parv Manthey Discharge date and time: 04/18/2021   Activities/Precautions/ Functional Status: Activity: no lifting, driving, or strenuous exercise for until cleared by MD Diet: diabetic diet Wound Care: none needed  Functional status:  ___ No restrictions     ___ Walk up steps independently ___ 24/7 supervision/assistance   ___ Walk up steps with assistance ___ Intermittent supervision/assistance  ___ Bathe/dress independently ___ Walk with walker     _x__ Bathe/dress with assistance ___ Walk Independently    ___ Shower independently ___ Walk with assistance    ___ Shower with assistance __x_ No alcohol     ___ Return to work/school ________   COMMUNITY REFERRALS UPON DISCHARGE:    Home Health:   PT     OT     ST                       Agency: [facility]    Phone: [phone] *Please expect follow-up within 2-3 days from discharge to schedule your home visit. If not, please be sure to follow-up with the branch directly.*   Medical Equipment/Items Ordered: bariatric drop arm bedside commode, wheelchair, rolling walker                                                 Agency/Supplier: [employer] [phone]   GENERAL COMMUNITY RESOURCES FOR PATIENT/FAMILY: A referral was sent to [facility] [phone] if transportation services are needed in the future. This service is free for appointments with [facility] providers ONLY. Please call within 2-3 business days to schedule your pick up to ensure you have transportation to your appointment. *Please understand this service will no longer be available beginning on 05/08/2021*  Special Instructions: Check blood sugars before each meal and at bedtime until you have followed up with your PCP. Absolutely no additional aspirin, aspirin-containing products, Aleve/ibuprofen/Motrin or alcohol due to bleed. 3. No driving, alcohol consumption or tobacco use.  4. You have been enrolled for  [employer] to ship a 30 day Cardiac event monitor to your address on file. Letter with instructions mailed to patient.  My questions have been answered and I understand these instructions. I will adhere to these goals and the provided educational materials after my discharge from the hospital.  Patient/Caregiver Signature _______________________________ Date __________  Clinician Signature _______________________________________ Date __________  Please bring this form and your medication list with you to all your follow-up doctor's appointments.      STROKE/TIA DISCHARGE INSTRUCTIONS SMOKING Cigarette smoking nearly doubles your risk of having a stroke & is the single most alterable risk factor  If you smoke or have smoked in the last 12 months, you are advised to quit smoking for your health. Most of the excess cardiovascular risk related to smoking disappears within a year of stopping. Ask you doctor about anti-smoking medications [place] Quit Line: 1-800-QUIT NOW Free Smoking Cessation Classes [phone]  CHOLESTEROL Know your levels; limit fat & cholesterol in your diet  Lipid Panel     Component Value Date/Time   CHOL 156 03/10/2021 0326   TRIG 72 03/10/2021 0326   TRIG 70 03/10/2021 0326   HDL 36 (L) 03/10/2021 0326   CHOLHDL 4.3 03/10/2021 0326   VLDL 14 03/10/2021 0326   LDLCALC 106 (H) 03/10/2021 0326     Many patients  benefit from treatment even if their cholesterol is at goal. Goal: Total Cholesterol (CHOL) less than 160 Goal:  Triglycerides (TRIG) less than 150 Goal:  HDL greater than 40 Goal:  LDL (LDLCALC) less than 100   BLOOD PRESSURE American Stroke Association blood pressure target is less that 120/80 mm/Hg  Your discharge blood pressure is:  BP: (!) 158/79 Monitor your blood pressure Limit your salt and alcohol intake Many individuals will require more than one medication for high blood pressure  DIABETES (A1c is a blood sugar average for last 3 months) Goal  HGBA1c is under 7% (HBGA1c is blood sugar average for last 3 months)  Diabetes: Diagnosis of diabetes:  Your A1c:11 %    Lab Results  Component Value Date   HGBA1C 11.1 (H) 03/10/2021    Your HGBA1c can be lowered with medications, healthy diet, and exercise. Check your blood sugar as directed by your physician Call your physician if you experience unexplained or low blood sugars.  PHYSICAL ACTIVITY/REHABILITATION Goal is 30 minutes at least 4 days per week  Activity: Increase activity slowly. No driving. Therapies: Physical Therapy: Home Health, Occupational Therapy: Home Health, and Speech Therapy: Home Health Return to work: n/a Activity decreases your risk of heart attack and stroke and makes your heart stronger.  It helps control your weight and blood pressure; helps you relax and can improve your mood. Participate in a regular exercise program. Talk with your doctor about the best form of exercise for you (dancing, walking, swimming, cycling).  DIET/WEIGHT Goal is to maintain a healthy weight  Your discharge diet is:  Diet Order             Diet Carb Modified Fluid consistency: Thin; Room service appropriate? Yes  Diet effective now                  thin liquids Your height is:  Height: 6' 0.99" (185.4 cm) Your current weight is: Weight: 128.8 kg Your Body Mass Index (BMI) is:  BMI (Calculated): 37.47 Following the type of diet specifically designed for you will help prevent another stroke. Your goal weight range is:   Your goal Body Mass Index (BMI) is 19-24. Healthy food habits can help reduce 3 risk factors for stroke:  High cholesterol, hypertension, and excess weight.  RESOURCES Stroke/Support Group:  Call [phone]   STROKE EDUCATION PROVIDED/REVIEWED AND GIVEN TO PATIENT Stroke warning signs and symptoms How to activate emergency medical system (call 911). Medications prescribed at discharge. Need for follow-up after discharge. Personal risk factors for  stroke. Pneumonia vaccine given: No Flu vaccine given: No My questions have been answered, the writing is legible, and I understand these instructions.  I will adhere to these goals & educational materials that have been provided to me after my discharge from the hospital.

## 2021-04-04 NOTE — Progress Notes (Signed)
Patient refused CPAP HS. Patient stated he feels really congested right now and doesn't want the cpap at this time. He stated he will wear his [place] tonight.

## 2021-04-04 NOTE — Progress Notes (Signed)
Physical Therapy Session Note  Patient Details  Name: Joseph Ortega MRN: [id number] Date of Birth: [birth year]  Today's Date: 04/04/2021 PT Missed Time: 60 Minutes Missed Time Reason: Other (Comment) (Nausea)  Short Term Goals: Week 3:  PT Short Term Goal 1 (Week 3): Pt will consistently perform bed mobility with CGA. PT Short Term Goal 2 (Week 3): Pt will consistently perform sit to stand with minA. PT Short Term Goal 3 (Week 3): Pt will consistently perform bed to chair transfer with minA. PT Short Term Goal 4 (Week 3): Pt will ambulate x50' with modA +1 and LRAD.  Skilled Therapeutic Interventions/Progress Updates:     Pt received supine in bed. States he is very nauseous and would like to rest at this time. PT will follow up as appropriate.  Therapy Documentation Precautions:  Precautions Precautions: Fall Precaution Comments: Lt hemi with Lt sided edema Restrictions Weight Bearing Restrictions: No General: PT Amount of Missed Time (min): 60 Minutes PT Missed Treatment Reason: Other (Comment) (Nausea)    Therapy/Group: Individual Therapy  Beau Fanny, PT, DPT 04/04/2021, 3:41 PM

## 2021-04-04 NOTE — Patient Care Conference (Signed)
Inpatient RehabilitationTeam Conference and Plan of Care Update Date: 04/04/2021   Time: 10:34 AM    Patient Name: Joseph Ortega      Medical Record Number: [id number]  Date of Birth: 07-14-1955 Sex: Male         Room/Bed: 4M11C/4M11C-01 Payor Info: Payor: BLUE CROSS BLUE SHIELD / Plan: BCBS COMM PPO / Product Type: *No Product type* /    Admit Date/Time:  03/17/2021  1:48 PM  Primary Diagnosis:  ICH (intracerebral hemorrhage) [facility])  Hospital Problems: Principal Problem:   ICH (intracerebral hemorrhage) [facility])    Expected Discharge Date: Expected Discharge Date: 04/15/21  Team Members Present: Physician leading conference: Dr. Faith Rogue Social Worker Present: Cecile Sheerer, LCSWA Nurse Present: Chana Bode, RN PT Present: Malachi Pro, PT OT Present: Kearney Hard, OT SLP Present: Feliberto Gottron, SLP PPS Coordinator present : Fae Pippin, SLP     Current Status/Progress Goal Weekly Team Focus  Bowel/Bladder   cont of b/b. LBM 1/16  remain cont  assess q shift and prn   Swallow/Nutrition/ Hydration             ADL's   Min/mod A LB ADLs, Min A UB ADLs, Mod/max A of 1 transfers, L UE improving  Min A  transfers, sit<>stands, NMES, self-care retraining, L UE NMR   Mobility   supervision supine to sit, minA sit to supine, minA to modA sit to stand with RW, ambulation up to 50' with RW and modA with WC follow  Supervision  L hemibody NMR, transfers, ambulation, consistency   Communication             Safety/Cognition/ Behavioral Observations            Pain   pt c/o headache  pain <3  assess q shift and prn   Skin   intact  remain intact  assess q shift and prn     Discharge Planning:      Team Discussion: Patient with nausea; 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 Progress limited by fatigue.  Patient on target to meet rehab goals: yes, currently needs min assist for standing. Mod assist needed for stand pivot transfers but able to ambulate up to 75'  with a RW and mod assist.  Goals for discharge set for min assist overall.   *See Care Plan and progress notes for long and short-term goals.   Revisions to Treatment Plan:  N/A   Teaching Needs: Safety, transfers, toileting, medication management, secondary risk management, etc.  Current Barriers to Discharge: Decreased caregiver support; wife is physically limited and does not drive. The patient's home has a ramped entry and is w/c accessible.  Possible Resolutions to Barriers: Facetime family education HH follow up (PT, OT and SLP) services recommended; set up with [facility]     Medical Summary Current Status: minimal right knee pain. is having some improvement in his left side motor function. nausea over intermittently over last few days. ?diabetic gastroparesis  Barriers to Discharge: Medical stability   Possible Resolutions to Barriers/Weekly Focus: add reglan, pain control, daily assessment of labs and pt data   Continued Need for Acute Rehabilitation Level of Care: The patient requires daily medical management by a physician with specialized training in physical medicine and rehabilitation for the following reasons: Direction of a multidisciplinary physical rehabilitation program to maximize functional independence : Yes Medical management of patient stability for increased activity during participation in an intensive rehabilitation regime.: Yes Analysis of laboratory values and/or radiology reports with any subsequent  need for medication adjustment and/or medical intervention. : Yes   I attest that I was present, lead the team conference, and concur with the assessment and plan of the team.   Chana Bode B 04/04/2021, 2:21 PM

## 2021-04-04 NOTE — Progress Notes (Signed)
PROGRESS NOTE   Subjective/Complaints: Had another spell where he didn't feel well yesterday. Bloated, nauseas, ill-feeling. After talking about it he recalled that he had some similar issues at home. They seem to be related to his meal intake.   ROS: Patient denies fever, rash, sore throat, blurred vision,  vomiting, diarrhea, cough, shortness of breath or chest pain,   headache, or mood change.    Objective:   No results found. No results for input(s): WBC, HGB, HCT, PLT in the last 72 hours.   Recent Labs    04/03/21 1400  NA 139  K 3.4*  CL 102  CO2 30  GLUCOSE 118*  BUN 9  CREATININE 0.74  CALCIUM 8.7*      Intake/Output Summary (Last 24 hours) at 04/04/2021 1102 Last data filed at 04/04/2021 0300 Gross per 24 hour  Intake 200 ml  Output 1725 ml  Net -1525 ml        Physical Exam: Vital Signs Blood pressure (!) 157/68, pulse (!) 56, temperature 98.7 F (37.1 C), resp. rate 14, height 6' 0.99" (1.854 m), weight 129 kg, SpO2 99 %.   Constitutional: No distress . Vital signs reviewed. obese HEENT: NCAT, EOMI, oral membranes moist Neck: supple Cardiovascular: RRR without murmur. No JVD    Respiratory/Chest: CTA Bilaterally without wheezes or rales. Normal effort    GI/Abdomen: BS +, non-tender, not really distended Ext: no clubbing, cyanosis, or edema Psych: pleasant and cooperative   Skin: No evidence of breakdown, no evidence of rash  Musculoskeletal: left hand edema better     tr   LE edema B/L  Right knee with crepitus and but no effusion  Neurological:     Mental Status: He is alert.     Comments: Alert and oriented x 3. Normal insight and awareness. Intact Memory. Normal language and speech. Cranial nerve exam unremarkable except for LLQ quadrantanopsia ongoing. LUE 2- /5 pec, biceps and 1+ /5 wrist and finger flexion, trace wrist extension.    LLE 2+ to 3-/5 HE, KE and 1+/5 PF, 0-tr/5 DF. RUE  and RLE grossly 5/5.  Motor exam consistent with prior       Assessment/Plan: 1. Functional deficits which require 3+ hours per day of interdisciplinary therapy in a comprehensive inpatient rehab setting. Physiatrist is providing close team supervision and 24 hour management of active medical problems listed below. Physiatrist and rehab team continue to assess barriers to discharge/monitor patient progress toward functional and medical goals  Care Tool:  Bathing    Body parts bathed by patient: Left arm, Chest, Abdomen, Front perineal area, Right upper leg, Left upper leg, Face, Right lower leg, Left lower leg   Body parts bathed by helper: Right arm, Buttocks     Bathing assist Assist Level: Moderate Assistance - Patient 50 - 74% Assistive Device Comment: LH sponge   Upper Body Dressing/Undressing Upper body dressing   What is the patient wearing?: Pull over shirt    Upper body assist Assist Level: Set up assist    Lower Body Dressing/Undressing Lower body dressing      What is the patient wearing?: Underwear/pull up, Pants     Lower body  assist Assist for lower body dressing: Moderate Assistance - Patient 50 - 74% Assistive Device Comment: Consulting civil engineerreacher   Toileting Toileting Toileting Activity did not occur Press photographer(Clothing management and hygiene only): N/A (no void or bm)  Toileting assist Assist for toileting: Maximal Assistance - Patient 25 - 49%     Transfers Chair/bed transfer  Transfers assist     Chair/bed transfer assist level: Maximal Assistance - Patient 25 - 49%     Locomotion Ambulation   Ambulation assist   Ambulation activity did not occur: Safety/medical concerns  Assist level: Moderate Assistance - Patient 50 - 74% Assistive device: Other (comment) (R guardrail) Max distance: 25'   Walk 10 feet activity   Assist  Walk 10 feet activity did not occur: Safety/medical concerns  Assist level: Moderate Assistance - Patient - 50 - 74% Assistive  device: Other (comment) (R guardrail)   Walk 50 feet activity   Assist Walk 50 feet with 2 turns activity did not occur: Safety/medical concerns         Walk 150 feet activity   Assist Walk 150 feet activity did not occur: Safety/medical concerns         Walk 10 feet on uneven surface  activity   Assist Walk 10 feet on uneven surfaces activity did not occur: Safety/medical concerns         Wheelchair     Assist Is the patient using a wheelchair?: Yes Type of Wheelchair: Manual    Wheelchair assist level: Dependent - Patient 0% Max wheelchair distance: 150'    Wheelchair 50 feet with 2 turns activity    Assist        Assist Level: Dependent - Patient 0%   Wheelchair 150 feet activity     Assist      Assist Level: Dependent - Patient 0%   Blood pressure (!) 157/68, pulse (!) 56, temperature 98.7 F (37.1 C), resp. rate 14, height 6' 0.99" (1.854 m), weight 129 kg, SpO2 99 %.    Medical Problem List and Plan: 1. 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 ?embolic. 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             -30 day outpatient cardiac event monitor recommended to r/o a fib              -patient may  shower             -ELOS/Goals: 04/15/21, supervision to min assist goals with PT and OT  -Continue CIR therapies including PT and OT. Interdisciplinary team conference today to discuss goals, barriers to discharge, and dc planning.      2.  Antithrombotics: -DVT/anticoagulation:  Mechanical: Sequential compression devices, below knee Bilateral lower extremities Pharmaceutical: Lovenox             -antiplatelet therapy: aspirin 81 mg 3. Pain Management: Tylenol  -OA right knee with associated pain   -continue with voltaren gel 4. Mood: neuropsych has seen him               -antipsychotic agents:  n/a 5. Neuropsych: This patient is capable of making decisions on his own behalf. 6. Skin/Wound Care: routine skin checks.  --Monitor BLE ulcerations. 7. Fluids/Electrolytes/Nutrition: eating well  -protein supp for hypoalbuminemia        8. Hypertension: continue valsartan and coreg.    -diet discussed   coreg increased to 12.5mg  with  reasonable control  9. 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 Hgb A1c = 11.1             -monitor cbg's, adjust regimen as needed  Carb modified diet.   CBG (last 3)  Recent Labs    04/03/21 1635 04/03/21 2154 04/04/21 0624  GLUCAP 117* 135* 100*         1/17controlled         levemir  39u bid-  10: Vitamin D deficiency: started on 1,000mg  daily supplement.  11: Hypothyroidism: continue Synthroid and recheck labs in 6 weeks 12: Peripheral neuropathy: continue gabapentin 300 mg twice daily at home 13: Herpes zoster of chest: continue Valtrex for total of 2-3 weeks 14: OSA: home BiPAP, 2L O2  at night and naps 15: History of depression: Cymbalta listed on home meds 16: Tobacco use: Offer nicotine patch and provide cessation counseling 17: EtOH use:   Magnesium level reviewed and was 2.2 on 12/21.  Counseling as appropriate 18: Obesity BMI 37.53: Dietary consultation. Provided list of foods that can help with weight loss.  19: Venous insufficiency, bilateral lower extremities: Has had treatment for venous stasis ulcers including Unna boots and follow-up at [facility] wound care center.  Currently with small superficial ulcerations bilateral lower legs.  He routinely wears compression stockings. Legs are stable  1/10 resumed lasix 1/16 edema has improved 20. Nausea/bloating:  ?diabetic gastoparesis  -see no obvious pharmaceutical causes  -begin reglan 5mg  po tid with meals.    LOS: 18 days A FACE TO FACE EVALUATION WAS PERFORMED  04/04/2021, 11:02 AM

## 2021-04-04 NOTE — Progress Notes (Signed)
Occupational Therapy Session Note  Patient Details  Name: Joseph Ortega MRN: [id number] Date of Birth: 06-02-1955  Today's Date: 04/04/2021 OT Missed Time: 30 Minutes Missed Time Reason: Patient ill (comment)   Skilled Therapeutic Interventions/Progress Updates:    Pt laying in bed and reporting feeling nauseous; declining out of bed activity and therapy at this time. Miss 30 min.  Addressed needs and left resting in bed.   Roney Mans [facility] 04/04/2021, 3:37 PM

## 2021-04-04 NOTE — Progress Notes (Signed)
Patient ID: Joseph Ortega, male   DOB: [birth year], 66 y.o.   MRN: [id number]  SW met with pt in room to provide updates from team conference on gains made, and d/c date remains 1/28. Pt not feeling well today.  Pt informed on [facility] being [facility]. SW reviewed d/c plan, and informed will follow-up with his wife to discuss further, and discuss if able to come in for family edu.   1414- SW spoke with pt wife Joseph Ortega [phone]) to provide updates. She confirms she is prepared for him to come home, and will hire private aide if needed. SW informed on [facility] being [facility]. She is not able to come in but can be available for zoom or facetime to see his progress. SW updated team.   Loralee Pacas, MSW, [place] Office: [phone] Cell: [phone] Fax: [phone]

## 2021-04-04 NOTE — Progress Notes (Signed)
Occupational Therapy Session Note  Patient Details  Name: Joseph Ortega MRN: [id number] Date of Birth: 05/04/1955  Today's Date: 04/04/2021 OT Individual Time: 1188-6773 OT Individual Time Calculation (min): 25 min    Short Term Goals: Week 3:  OT Short Term Goal 1 (Week 3): Patient will complete toilet transfer with MOd A OT Short Term Goal 2 (Week 3): Patient will complete 1 step of toileting task OT Short Term Goal 3 (Week 3): Patient will stand at the sink with consitent mod A of 1 in preparation for BADL tasks.  Skilled Therapeutic Interventions/Progress Updates:    Pt resting in w/c upon arrival. Saebo for LUE wrist and finger extension in place and operative. Saebo removed when complete. OT intervention with focus on  LUE AAROM: shoulder flexion/extension, shoulder horizontal adduction/abduction, and elbow flexion/extension-3x5 each action. Pt remained in w/c with belt alarm activated, half lap tray in place, and all needs within reach.  Therapy Documentation Precautions:  Precautions Precautions: Fall Precaution Comments: Lt hemi with Lt sided edema Restrictions Weight Bearing Restrictions: No  Pain:  Pt denies pain this morning but c/o nausea (RN aware)   Therapy/Group: Individual Therapy  Rich Brave 04/04/2021, 10:02 AM

## 2021-04-05 DIAGNOSIS — E1143 Type 2 diabetes mellitus with diabetic autonomic (poly)neuropathy: Secondary | ICD-10-CM

## 2021-04-05 DIAGNOSIS — K3184 Gastroparesis: Secondary | ICD-10-CM

## 2021-04-05 LAB — GLUCOSE, CAPILLARY
Glucose-Capillary: 143 mg/dL — ABNORMAL HIGH (ref 70–99)
Glucose-Capillary: 161 mg/dL — ABNORMAL HIGH (ref 70–99)
Glucose-Capillary: 138 mg/dL — ABNORMAL HIGH (ref 70–99)
Glucose-Capillary: 161 mg/dL — ABNORMAL HIGH (ref 70–99)

## 2021-04-05 NOTE — Progress Notes (Signed)
Occupational Therapy Session Note  Patient Details  Name: Joseph Ortega MRN: [id number] Date of Birth: 1955/10/10   Session 1: Today's Date: 04/05/2021 OT Individual Time: 0017-4944 OT Individual Time Calculation (min): 20 min  and Today's Date: 04/05/2021 OT Missed Time: 10 Minutes Missed Time Reason: Patient ill (comment)  Session 2: Today's Date: 04/05/2021 OT Individual Time: 1330-1430 OT Individual Time Calculation (min): 60 min    Short Term Goals: Week 3:  OT Short Term Goal 1 (Week 3): Patient will complete toilet transfer with MOd A OT Short Term Goal 2 (Week 3): Patient will complete 1 step of toileting task OT Short Term Goal 3 (Week 3): Patient will stand at the sink with consitent mod A of 1 in preparation for BADL tasks.  Skilled Therapeutic Interventions/Progress Updates:    Session 1: Pt received sitting in the w/c c/o recent n/v. He overall looked unwell and reported he felt "horrible". Vitals assessed and all WNL, also reported nursing had recently checked blood sugar. He completed a sit > stand from the w/c using the stedy with min A and was transferred back to bed. He was agreeable to OT placing saebo on his L shoulder while he rested. Pt was left supine with all needs met. Provided with a sugar free ginger ale to calm his stomach. 10 min missed d/t feeling sick.   Saebo Stim One placed on middle and anterior deltoid for joint approximation and muscle activation. OT returned to remove saebo and he had no adverse skin reactions or pain reported.  330 pulse width 35 Hz pulse rate On 8 sec/ off 8 sec Ramp up/ down 2 sec Symmetrical Biphasic wave form  Max intensity 153m at 500 Ohm load   Session 2: Pt received supine feeling better but still not 100%. Pt agreeable to LUE NMR. Pt came to sidelying with CGA. In sidelying he completed several active assist elbow flexion/extension with cueing for visual attention. Poor maintenance of supination with compensatory flexor  synergy patterns coming out. Worked on isolated supination/pronation with pt able to get 60% pronation with AAROM. Pt completed isolated and blocked practice wrist flexion/extension and hand composite flexion/ext. He lacks 25% full composite flexion/ext in his hand and about 15 degrees of wrist flex/ext. Completed AAROM internal and external rotation. Edu provided re CVA recovery throughout session. Pt rolled to the R side with mod A and cueing for LUE/LE facilitation and activation. Pt completed gravity eliminated shoulder flexion/extension. Great activation with focus on reducing compensatory shoulder abduction. Mod cueing/facilitation required. Pt returned to supine and was left with all needs met, bed alarm set.     Therapy Documentation Precautions:  Precautions Precautions: Fall Precaution Comments: Lt hemi with Lt sided edema Restrictions Weight Bearing Restrictions: No  Therapy/Group: Individual Therapy  SCurtis Sites1/18/2023, 6:22 AM

## 2021-04-05 NOTE — Progress Notes (Signed)
Physical Therapy Session Note  Patient Details  Name: Joseph Ortega MRN: [id number] Date of Birth: Oct 14, 1955  Today's Date: 04/05/2021 PT Individual Time: 7011-0034 PT Individual Time Calculation (min): 71 min   Short Term Goals: Week 3:  PT Short Term Goal 1 (Week 3): Pt will consistently perform bed mobility with CGA. PT Short Term Goal 2 (Week 3): Pt will consistently perform sit to stand with minA. PT Short Term Goal 3 (Week 3): Pt will consistently perform bed to chair transfer with minA. PT Short Term Goal 4 (Week 3): Pt will ambulate x50' with modA +1 and LRAD.  Skilled Therapeutic Interventions/Progress Updates:     Pt received seated in [facility] and agrees to therapy. No complaint of pain.WC transport to gym for time management. Session initially focused on gait training in litegait over treadmill for bodyweight supported ambulation. Pt performs sit to stand with modA +2 and steps up onto treadmill with cues on step sequencing and facilitation of weight shifting. Pt takes seated rest break prior to donning harness in standing with CGA and cues for posture. Mirror provided for visual feedback.Pt performs following bouts on treadmill:  3:00 at 0.3-0.5 mph for 106'. Pt has forward flexed posture and minimal hip extension, with difficulty progressing L leg and routinely dragging both feet. MaxA +2 provided to facilitate lateral weight shifting and hip extension, as well as assistance to progress and manage L lower extremity. Pt takes extended seated rest break and PT educates on importance of core engagement and hip muscle group engagement for ambulation. Pt ambulates 2nd bout of:  5:00 at 0.3-0. for 216'. Pt has much improved posture and gait mechanics this bout. PT assists with progression of L lower extremity and maintaining adequate stance width, and pt has consistent reciprocal stepping pattern. Following, pt steps backward off treadmill with modA +2 and cues for sequencing.  Pt  practices sit to stand transfer training to work on Estate manager/land agent and sequencing. Mirror provided for visual feedback. PT educates and demonstrates extensively on anterior trunk lean and hip extensor engagement to optimally sequence transition. Pt performs x1 with modA +1 due to posterior bias. On 2nd attempt pt completes with very light minA. In standing, pt performs lateral weight shifting without upper extremity support. PT provides light blocking of L knee but no buckling noted.   Pt left seated in WC with alarm intact and all needs within reach.  Therapy Documentation Precautions:  Precautions Precautions: Fall Precaution Comments: Lt hemi with Lt sided edema Restrictions Weight Bearing Restrictions: No   Therapy/Group: Individual Therapy  Beau Fanny, PT, DPT 04/05/2021, 12:38 PM

## 2021-04-05 NOTE — Progress Notes (Signed)
Placed patient on bipap for the night with oxygen set at 2lpm  

## 2021-04-05 NOTE — Progress Notes (Signed)
Physical Therapy Session Note  Patient Details  Name: Joseph Ortega MRN: [id number] Date of Birth: [birth year]  Today's Date: 04/05/2021 PT Individual Time: 0800-0900 PT Individual Time Calculation (min): 60 min   Short Term Goals: Week 3:  PT Short Term Goal 1 (Week 3): Pt will consistently perform bed mobility with CGA. PT Short Term Goal 2 (Week 3): Pt will consistently perform sit to stand with minA. PT Short Term Goal 3 (Week 3): Pt will consistently perform bed to chair transfer with minA. PT Short Term Goal 4 (Week 3): Pt will ambulate x50' with modA +1 and LRAD.  Skilled Therapeutic Interventions/Progress Updates:    Pt received in room transporting to bathroom via stedy with assist from nursing staff. This therapist takes over. Pt continent of urine in urinal and BM in toilet while seated on elevated BSC over toilet. Sit to stand with min A to stedy. Pt is dependent for pericare and clothing management in standing. Stedy transfer to sink where pt able to wash hands independently in semi-perched position. Stedy transfer to w/c. Manual w/c propulsion x 200 ft with use of R UE/LE at Supervision level with increased time needed to complete distance and cues needed to avoid obstacles on L side. Pt able to retrieve laundry from dryer, placed in clean patient belongings back in pt room. Sit to stand with max A to RW with L hand orthosis. Ambulation x 100 ft with RW with max A for balance and close w/c follow for safety. Pt exhibits some L lateral lean in standing and occasional toe catching on the L during gait with anterior LOB, able to correct with mod to max A. Pt left seated in w/c in room with needs in reach, quick release belt and chair alarm in place at end of session.  Therapy Documentation Precautions:  Precautions Precautions: Fall Precaution Comments: Lt hemi with Lt sided edema Restrictions Weight Bearing Restrictions: No      Therapy/Group: Individual Therapy   Excell Seltzer, PT, DPT, CSRS  04/05/2021, 10:14 AM

## 2021-04-05 NOTE — Progress Notes (Signed)
PROGRESS NOTE   Subjective/Complaints: Pt had some dry heaving yesterday morning but had no other problems later in the day after starting reglan.   ROS: Patient denies fever, rash, sore throat, blurred vision,   diarrhea, cough, shortness of breath or chest pain, joint or back pain, headache, or mood change.    Objective:   No results found. No results for input(s): WBC, HGB, HCT, PLT in the last 72 hours.   Recent Labs    04/03/21 1400  NA 139  K 3.4*  CL 102  CO2 30  GLUCOSE 118*  BUN 9  CREATININE 0.74  CALCIUM 8.7*      Intake/Output Summary (Last 24 hours) at 04/05/2021 0826 Last data filed at 04/05/2021 0522 Gross per 24 hour  Intake 420 ml  Output 500 ml  Net -80 ml        Physical Exam: Vital Signs Blood pressure (!) 170/86, pulse 70, temperature 98 F (36.7 C), resp. rate 16, height 6' 0.99" (1.854 m), weight 129 kg, SpO2 94 %.   Constitutional: No distress . Vital signs reviewed. HEENT: NCAT, EOMI, oral membranes moist Neck: supple Cardiovascular: RRR without murmur. No JVD    Respiratory/Chest: CTA Bilaterally without wheezes or rales. Normal effort    GI/Abdomen: BS +, non-tender, non-distended Ext: no clubbing, cyanosis  Psych: pleasant and cooperative   Skin: No evidence of breakdown, no evidence of rash  Musculoskeletal: left hand with 1+ edema--taped     trace   LE edema B/L  Right knee with crepitus and but no effusion  Neurological:     Mental Status: He is alert.     Comments: Alert and oriented x 3. Normal insight and awareness. Intact Memory. Normal language and speech. Cranial nerve exam unremarkable except for LLQ quadrantanopsia ongoing. LUE 2- /5 pec, biceps and 1+ /5 wrist and finger flexion, trace wrist extension.    LLE 2+ to 3-/5 HE, KE and 1+/5 PF, 0-tr/5 DF. RUE and RLE grossly 5/5.  Motor exam stable to improved       Assessment/Plan: 1. Functional deficits which  require 3+ hours per day of interdisciplinary therapy in a comprehensive inpatient rehab setting. Physiatrist is providing close team supervision and 24 hour management of active medical problems listed below. Physiatrist and rehab team continue to assess barriers to discharge/monitor patient progress toward functional and medical goals  Care Tool:  Bathing    Body parts bathed by patient: Left arm, Chest, Abdomen, Front perineal area, Right upper leg, Left upper leg, Face, Right lower leg, Left lower leg   Body parts bathed by helper: Right arm, Buttocks     Bathing assist Assist Level: Moderate Assistance - Patient 50 - 74% Assistive Device Comment: LH sponge   Upper Body Dressing/Undressing Upper body dressing   What is the patient wearing?: Pull over shirt    Upper body assist Assist Level: Set up assist    Lower Body Dressing/Undressing Lower body dressing      What is the patient wearing?: Underwear/pull up     Lower body assist Assist for lower body dressing: Minimal Assistance - Patient > 75% Assistive Device Comment: Mining engineerreacher   Toileting  Toileting Toileting Activity did not occur (Acupuncturist and hygiene only): N/A (no void or bm)  Toileting assist Assist for toileting: Moderate Assistance - Patient 50 - 74%     Transfers Chair/bed transfer  Transfers assist     Chair/bed transfer assist level: Moderate Assistance - Patient 50 - 74%     Locomotion Ambulation   Ambulation assist   Ambulation activity did not occur: Safety/medical concerns  Assist level: Moderate Assistance - Patient 50 - 74% Assistive device: Other (comment) (R guardrail) Max distance: 25'   Walk 10 feet activity   Assist  Walk 10 feet activity did not occur: Safety/medical concerns  Assist level: Moderate Assistance - Patient - 50 - 74% Assistive device: Other (comment) (R guardrail)   Walk 50 feet activity   Assist Walk 50 feet with 2 turns activity did not  occur: Safety/medical concerns         Walk 150 feet activity   Assist Walk 150 feet activity did not occur: Safety/medical concerns         Walk 10 feet on uneven surface  activity   Assist Walk 10 feet on uneven surfaces activity did not occur: Safety/medical concerns         Wheelchair     Assist Is the patient using a wheelchair?: Yes Type of Wheelchair: Manual    Wheelchair assist level: Dependent - Patient 0% Max wheelchair distance: 150'    Wheelchair 50 feet with 2 turns activity    Assist        Assist Level: Dependent - Patient 0%   Wheelchair 150 feet activity     Assist      Assist Level: Dependent - Patient 0%   Blood pressure (!) 170/86, pulse 70, temperature 98 F (36.7 C), resp. rate 16, height 6' 0.99" (1.854 m), weight 129 kg, SpO2 94 %.    Medical Problem List and Plan: 1. 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 ?embolic. 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             -30 day outpatient cardiac event monitor recommended to r/o a fib              -patient may  shower             -ELOS/Goals: 04/15/21, supervision to min assist goals with PT and OT  -Continue CIR therapies including PT, OT     2.  Antithrombotics: -DVT/anticoagulation:  Mechanical: Sequential compression devices, below knee Bilateral lower extremities Pharmaceutical: Lovenox             -antiplatelet therapy: aspirin 81 mg 3. Pain Management: Tylenol  -OA right knee with associated pain   -continue with voltaren gel 4. Mood: neuropsych has seen him               -antipsychotic agents: n/a 5. Neuropsych: This patient is capable of making decisions on his own behalf. 6. Skin/Wound Care: routine skin checks.  --Monitor BLE ulcerations. 7. Fluids/Electrolytes/Nutrition: eating well  -protein supp for  hypoalbuminemia        8. Hypertension: continue valsartan and coreg.    -diet discussed   coreg increased to 12.5mg  with reasonable control  9. 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 Hgb A1c = 11.1             -monitor cbg's, adjust regimen as needed  Carb modified diet.   CBG (last 3)  Recent Labs    04/04/21 1657 04/04/21 2147 04/05/21 0555  GLUCAP 169* 231* 143*         1/18 pt with reasonable control until last 24 hours--observe today before making any changes        levemir  39u bid-  10: Vitamin D deficiency: started on 1,000mg  daily supplement.  11: Hypothyroidism: continue Synthroid and recheck labs in 6 weeks 12: Peripheral neuropathy: continue gabapentin 300 mg twice daily at home 13: Herpes zoster of chest: continue Valtrex for total of 2-3 weeks 14: OSA: home BiPAP, 2L O2  at night and naps 15: History of depression: Cymbalta listed on home meds 16: Tobacco use: Offer nicotine patch and provide cessation counseling 17: EtOH use:   Magnesium level reviewed and was 2.2 on 12/21.  Counseling as appropriate 18: Obesity BMI 37.53: Dietary consultation. Provided list of foods that can help with weight loss.  19: Venous insufficiency, bilateral lower extremities: Has had treatment for venous stasis ulcers including Unna boots and follow-up at [facility] wound care center.  Currently with small superficial ulcerations bilateral lower legs.  He routinely wears compression stockings. Legs are stable  1/10 resumed lasix 1/16 edema has improved 20. 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    LOS: 19 days A FACE TO FACE EVALUATION WAS PERFORMED  04/05/2021, 8:26 AM

## 2021-04-06 LAB — GLUCOSE, CAPILLARY
Glucose-Capillary: 164 mg/dL — ABNORMAL HIGH (ref 70–99)
Glucose-Capillary: 351 mg/dL — ABNORMAL HIGH (ref 70–99)
Glucose-Capillary: 105 mg/dL — ABNORMAL HIGH (ref 70–99)
Glucose-Capillary: 172 mg/dL — ABNORMAL HIGH (ref 70–99)

## 2021-04-06 NOTE — Progress Notes (Signed)
Physical Therapy Session Note  Patient Details  Name: Joseph Ortega MRN: [id number] Date of Birth: 04-13-1955  Today's Date: 04/06/2021 PT Individual Time: 0902-1000 and [id number] PT Individual Time Calculation (min): 58 min and 43 min  Short Term Goals: Week 3:  PT Short Term Goal 1 (Week 3): Pt will consistently perform bed mobility with CGA. PT Short Term Goal 2 (Week 3): Pt will consistently perform sit to stand with minA. PT Short Term Goal 3 (Week 3): Pt will consistently perform bed to chair transfer with minA. PT Short Term Goal 4 (Week 3): Pt will ambulate x50' with modA +1 and LRAD.  Skilled Therapeutic Interventions/Progress Updates:     1st Session: Pt received seated in [facility] and agrees to therapy. No complaint of pain. WC transport to gym for time management. Pt performs sit to stand to RW with modA and cues for hand placement and body mechanics. Pt ambulates with RW and L hand splint, x148'. For majority of distance PT provides minA (with +2 WC follow for safety) and cues to increase proximity to RW, maintain hip and trunk extension and engagement of L lower extremity engagement, and maintaining sufficiently wide BOS as pt tends to adduct L leg during swing phase. Pt has x3-4 LOBs, however, requiring maxA to prevent fall. LOBs tend to be to the L and posteriorly.  Pt takes extended seated rest break following ambulation due to lower extremity fatigue. Pt performs squat pivot to mat with modA. Seated at edge of mat, blue platform bench placed in between pt's legs. Pt then cued to reach forward to retrieve horse shoes placed on rolling stool on opposite side of platform. Activity performed to encourage pt to shift weight anteriorly with control and transition weight from buttocks to lower extremities, with cue to "clear buttocks form mat" with each horse shoe. Pt is able to complete with CGA and consistent verbal cueing for feedback on mechanics and positioning.   Pt performs squat pivot  back to WC with minA and cues for head hips relationship, initiation, and sequencing. Left seated in WC with alarm intact and all needs within reach.  2nd Session: Pt received seated in [facility] and agrees to therapy. No complaint of pain. WC transport to gym for time management. Pt performs squat pivot transfer with modA and cues for hand placement and sequencing. Seated at edge of mat, pt positioned with large exercise ball in between legs. Pt placed R hand over L on ball with extended arms, and slowly flexes forward at hips and trunk to roll ball forward and perform anterior weight shift. 2x10. PT provides verbal and tactile cues for optimal body mechanics. Pt progresses to perform partial stands, clearing buttocks from mat and pressing into ball with both upper extremities. 2x10. Pt then practices block training of sit to stand without upper extremity support. Mat raised ~2 inches and pt slowly brought into anterior trunk lean and hip flexion until COG is felt over anterior portion of feet. Pt then cued to press up and extend hips. Initially pt requires modA to complete but with repeated reps improves to CGA/minA, through pt is noted to be pressing backs of legs against mat for support. PT cues for adjustment of legs and pt is eventually able to perform sit to stand with minA. In standing, pt performs lateral weight shifting to targets, tapping shoulders on targets laterally to encourage maximal transition of weight, with L target gradually getting farther from from pt to encourage increased weight shifting. Squat  pivot back to WC with minA. Left seated in WC with alarm intact and all needs within reach.  Therapy Documentation Precautions:  Precautions Precautions: Fall Precaution Comments: Lt hemi with Lt sided edema Restrictions Weight Bearing Restrictions: No   Therapy/Group: Individual Therapy  Breck Coons, PT, DPT 04/06/2021, 3:55 PM

## 2021-04-06 NOTE — Progress Notes (Signed)
Recreational Therapy Session Note  Patient Details  Name: Joseph Ortega MRN: [id number] Date of Birth: [birth year] Today's Date: 04/06/2021  Pain: no c/o Skilled Therapeutic Interventions/Progress Updates: Session focused on community reintegration educaiton.  LRT discussed the purpose of an outing and potential goals while OT worked with pt on LUE exercise.  Pt agreeable to participate in an outing next week to the grocery store  Therapy/Group: Co-Treatment  Ayva Veilleux 04/06/2021, 4:00 PM

## 2021-04-06 NOTE — Progress Notes (Signed)
Patient ID: Macy Lingenfelter, male   DOB: May 18, 1955, 66 y.o.   MRN: [id number]  Referral sent to [facility] [phone]) if services are needed in the future.   Cecile Sheerer, MSW, LCSWA Office: [phone] Cell: [phone] Fax: [phone]

## 2021-04-06 NOTE — Progress Notes (Signed)
PROGRESS NOTE   Subjective/Complaints: Had another episode of nausea yesterday but it was after taking meds on empty stomach. Otherwise doing well  ROS: Patient denies fever, rash, sore throat, blurred vision,  vomiting, diarrhea, cough, shortness of breath or chest pain, joint or back pain, headache, or mood change. .    Objective:   No results found. No results for input(s): WBC, HGB, HCT, PLT in the last 72 hours.   Recent Labs    04/03/21 1400  NA 139  K 3.4*  CL 102  CO2 30  GLUCOSE 118*  BUN 9  CREATININE 0.74  CALCIUM 8.7*      Intake/Output Summary (Last 24 hours) at 04/06/2021 1036 Last data filed at 04/06/2021 0802 Gross per 24 hour  Intake 480 ml  Output 1850 ml  Net -1370 ml        Physical Exam: Vital Signs Blood pressure (!) 180/83, pulse 74, temperature 97.9 F (36.6 C), resp. rate 16, height 6' 0.99" (1.854 m), weight 129 kg, SpO2 91 %.   Constitutional: No distress . Vital signs reviewed. HEENT: NCAT, EOMI, oral membranes moist Neck: supple Cardiovascular: RRR without murmur. No JVD    Respiratory/Chest: CTA Bilaterally without wheezes or rales. Normal effort    GI/Abdomen: BS +, non-tender, non-distended Ext: no clubbing, cyanosis, tr le edema Psych: pleasant and cooperative  Skin: No evidence of breakdown, no evidence of rash  Musculoskeletal: left hand with 1+ edema--taped     trace   LE edema B/L  Right knee with crepitus and but no effusion  Neurological:     Mental Status: He is alert.     Comments: Alert and oriented x 3. Normal insight and awareness. Intact Memory. Normal language and speech. Cranial nerve exam unremarkable except for LLQ quadrantanopsia ongoing. LUE 2- /5 pec, biceps and 2- /5 wrist and finger flexion, 1/5 wrist extension.    LLE 2+ to 3-/5 HE, KE and 1+/5 PF, 0-tr/5 DF. RUE and RLE grossly 5/5.  Motor exam stable to improved       Assessment/Plan: 1.  Functional deficits which require 3+ hours per day of interdisciplinary therapy in a comprehensive inpatient rehab setting. Physiatrist is providing close team supervision and 24 hour management of active medical problems listed below. Physiatrist and rehab team continue to assess barriers to discharge/monitor patient progress toward functional and medical goals  Care Tool:  Bathing    Body parts bathed by patient: Left arm, Chest, Abdomen, Front perineal area, Right upper leg, Left upper leg, Face, Right lower leg, Left lower leg   Body parts bathed by helper: Right arm, Buttocks     Bathing assist Assist Level: Moderate Assistance - Patient 50 - 74% Assistive Device Comment: LH sponge   Upper Body Dressing/Undressing Upper body dressing   What is the patient wearing?: Pull over shirt    Upper body assist Assist Level: Set up assist    Lower Body Dressing/Undressing Lower body dressing      What is the patient wearing?: Underwear/pull up     Lower body assist Assist for lower body dressing: Minimal Assistance - Patient > 75% Assistive Device Comment: Mining engineerreacher   Toileting  Toileting Toileting Activity did not occur (Acupuncturist and hygiene only): N/A (no void or bm)  Toileting assist Assist for toileting: Moderate Assistance - Patient 50 - 74%     Transfers Chair/bed transfer  Transfers assist     Chair/bed transfer assist level: Moderate Assistance - Patient 50 - 74%     Locomotion Ambulation   Ambulation assist   Ambulation activity did not occur: Safety/medical concerns  Assist level: Moderate Assistance - Patient 50 - 74% Assistive device: Other (comment) (R guardrail) Max distance: 25'   Walk 10 feet activity   Assist  Walk 10 feet activity did not occur: Safety/medical concerns  Assist level: Moderate Assistance - Patient - 50 - 74% Assistive device: Other (comment) (R guardrail)   Walk 50 feet activity   Assist Walk 50 feet with 2  turns activity did not occur: Safety/medical concerns         Walk 150 feet activity   Assist Walk 150 feet activity did not occur: Safety/medical concerns         Walk 10 feet on uneven surface  activity   Assist Walk 10 feet on uneven surfaces activity did not occur: Safety/medical concerns         Wheelchair     Assist Is the patient using a wheelchair?: Yes Type of Wheelchair: Manual    Wheelchair assist level: Dependent - Patient 0% Max wheelchair distance: 150'    Wheelchair 50 feet with 2 turns activity    Assist        Assist Level: Dependent - Patient 0%   Wheelchair 150 feet activity     Assist      Assist Level: Dependent - Patient 0%   Blood pressure (!) 180/83, pulse 74, temperature 97.9 F (36.6 C), resp. rate 16, height 6' 0.99" (1.854 m), weight 129 kg, SpO2 91 %.    Medical Problem List and Plan: 1. 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 ?embolic. 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             -30 day outpatient cardiac event monitor recommended to r/o a fib              -patient may  shower             -ELOS/Goals: 04/15/21, supervision to min assist goals with PT and OT  -Continue CIR therapies including PT, OT   2.  Antithrombotics: -DVT/anticoagulation:  Mechanical: Sequential compression devices, below knee Bilateral lower extremities Pharmaceutical: Lovenox             -antiplatelet therapy: aspirin 81 mg 3. Pain Management: Tylenol  -OA right knee with associated pain   -continue with voltaren gel 4. Mood: neuropsych has seen him               -antipsychotic agents: n/a 5. Neuropsych: This patient is capable of making decisions on his own behalf. 6. Skin/Wound Care: routine skin checks.  --Monitor BLE ulcerations. 7. Fluids/Electrolytes/Nutrition: eating  well  -protein supp for hypoalbuminemia        8. Hypertension: continue valsartan and coreg.    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 Hgb A1c = 11.1             -monitor cbg's, adjust regimen as needed  Carb modified diet.   CBG (last 3)  Recent Labs    04/05/21 1649 04/05/21 2128 04/06/21 0542  GLUCAP 138* 161* 164*         1/19 fair control. No chnges        levemir  39u bid-  10: Vitamin D deficiency: started on 1,000mg  daily supplement.  11: Hypothyroidism: continue Synthroid and recheck labs in 6 weeks 12: Peripheral neuropathy: continue gabapentin 300 mg twice daily at home 13: Herpes zoster of chest: continue Valtrex for total of 2-3 weeks 14: OSA: home BiPAP, 2L O2  at night and naps 15: History of depression: Cymbalta listed on home meds 16: Tobacco use: Offer nicotine patch and provide cessation counseling 17: EtOH use:   Magnesium level reviewed and was 2.2 on 12/21.  Counseling as appropriate 18: Obesity BMI 37.53: Dietary consultation. Provided list of foods that can help with weight loss.  19: Venous insufficiency, bilateral lower extremities: Has had treatment for venous stasis ulcers including Unna boots and follow-up at [facility] wound care center.  Currently with small superficial ulcerations bilateral lower legs.  He routinely wears compression stockings. Legs are stable  1/10 resumed lasix 1/16 edema has improved 20. 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   -take meds with food/drink   LOS: 20 days A FACE TO FACE EVALUATION WAS PERFORMED  04/06/2021, 10:36 AM

## 2021-04-06 NOTE — Progress Notes (Signed)
Occupational Therapy Session Note ° °Patient Details  °Name: Joseph Ortega °MRN: [id number] °Date of Birth: 07/05/1955 ° °Today's Date: 04/06/2021 °Session 1 °OT Individual Time: 1100-1200 °OT Individual Time Calculation (min): 60 min  ° °Session 2 °OT Individual Time: 1304-1330 °OT Individual Time Calculation (min): 26 min  ° °Short Term Goals: °Week 3:  OT Short Term Goal 1 (Week 3): Patient will complete toilet transfer with MOd A °OT Short Term Goal 2 (Week 3): Patient will complete 1 step of toileting task °OT Short Term Goal 3 (Week 3): Patient will stand at the sink with consitent mod A of 1 in preparation for BADL tasks. ° °Skilled Therapeutic Interventions/Progress Updates:  °  Session 1 °Pt greeted seated in wc and agreeable to OT treatment session. Pt reported legs being very fatigued from walking over 100 feet with PT in earlier session. Pt declined any BADL tasks today. Addressed wc propulsion using hemi-technique to therapy apartment. Practiced turning into doorway and educated on kitchen management from wc level. L UE NMR pushing L UE up wedge using pillow case to reduce friction. Guided A from OT to get started, then pt able to continue pushing uphill. 3 sets of 10 elbow flex/ext and wrist extension with  OT facilitation for normal movement patterns and to decrease shoulder hike. Cup stacking activity focused on breaking down each movement, and min A from OT. OT issued soft tan theraputty and addressed pushing down through putty, and rolling into a log. Pt returned to room and was left seated in wc with alarm belt on and needs met.  ° °OT placed SAEBO e-stim. SAEBO left on for 60 minutes. OT returned to remove SAEBO with skin intact and no adverse reactions.  °Saebo Stim One °330 pulse width °35 Hz pulse rate °On 8 sec/ off 8 sec °Ramp up/ down 2 sec °Symmetrical Biphasic wave form  °Max intensity 110mA at 500 Ohm load ° °Pain: ° Denies pain ° °Session 2 °Pt greeted seated in wc and agreeable to OT  treatment session, COTX with Rec Therapy. Pt brought outside with education provided on accessing elevator from wc level. Discussed options for an outing with RT and pt while OT brought pt through L UE NMR exercises. L elbow flex/ext, shoulder flex/ext and wrist flex/ext with joint input from OT to bring pt through full ROM. Pt returned to room and completed sit<>stand w/ RW and mod A, OT assist for L lateral lean, then CGA in standing while reaching overhead with R UE 2x for stretching and trunk elongation. Pt left seated in wc with alarm belt on, call bell in reach, and needs met.  ° °Therapy Documentation °Precautions:  °Precautions °Precautions: Fall °Precaution Comments: Lt hemi with Lt sided edema °Restrictions °Weight Bearing Restrictions: No °Pain: ° Denies pain ° ° °Therapy/Group: Individual Therapy ° °Elisabeth S Doe °04/06/2021, 2:35 PM °

## 2021-04-07 LAB — GLUCOSE, CAPILLARY
Glucose-Capillary: 167 mg/dL — ABNORMAL HIGH (ref 70–99)
Glucose-Capillary: 315 mg/dL — ABNORMAL HIGH (ref 70–99)
Glucose-Capillary: 141 mg/dL — ABNORMAL HIGH (ref 70–99)
Glucose-Capillary: 101 mg/dL — ABNORMAL HIGH (ref 70–99)

## 2021-04-07 MED ORDER — INSULIN DETEMIR 100 UNIT/ML ~~LOC~~ SOLN
16.0000 [IU] | Freq: Once | SUBCUTANEOUS | Status: AC
  Administered 2021-04-07: 16 [IU] via SUBCUTANEOUS
  Filled 2021-04-07: qty 0.16

## 2021-04-07 NOTE — Progress Notes (Signed)
PROGRESS NOTE   Subjective/Complaints: Had a good day yesterday. No nausea. Continues to progress with therapy  ROS: Patient denies fever, rash, sore throat, blurred vision, nausea, vomiting, diarrhea, cough, shortness of breath or chest pain, joint or back pain, headache, or mood change.    Objective:   No results found. No results for input(s): WBC, HGB, HCT, PLT in the last 72 hours.   No results for input(s): NA, K, CL, CO2, GLUCOSE, BUN, CREATININE, CALCIUM in the last 72 hours.     Intake/Output Summary (Last 24 hours) at 04/07/2021 1009 Last data filed at 04/07/2021 0927 Gross per 24 hour  Intake 478 ml  Output 1325 ml  Net -847 ml        Physical Exam: Vital Signs Blood pressure (!) 171/84, pulse 60, temperature 97.6 F (36.4 C), resp. rate 16, height 6' 0.99" (1.854 m), weight 129 kg, SpO2 97 %.   Constitutional: No distress . Vital signs reviewed. HEENT: NCAT, EOMI, oral membranes moist Neck: supple Cardiovascular: RRR without murmur. No JVD    Respiratory/Chest: CTA Bilaterally without wheezes or rales. Normal effort    GI/Abdomen: BS +, non-tender, non-distended Ext: no clubbing, cyanosis, or edema Psych: pleasant and cooperative   Skin: No evidence of breakdown, no evidence of rash  Musculoskeletal: left hand with 1+ edema--k-tape     trace   LE edema B/L  Right knee with crepitus and but no effusion  Neurological:     Mental Status: He is alert.     Comments: Alert and oriented x 3. Normal insight and awareness. Intact Memory. Normal language and speech. Cranial nerve exam unremarkable except for LLQ quadrantanopsia ongoing. LUE 2- /5 pec, biceps and 2- /5 wrist and finger flexion, 1/5 wrist extension.    LLE 2+ to 3-/5 HE, KE and 1+/5 PF, 0-tr/5 DF. RUE and RLE grossly 5/5. Gradual improvement in motor exam       Assessment/Plan: 1. Functional deficits which require 3+ hours per day of  interdisciplinary therapy in a comprehensive inpatient rehab setting. Physiatrist is providing close team supervision and 24 hour management of active medical problems listed below. Physiatrist and rehab team continue to assess barriers to discharge/monitor patient progress toward functional and medical goals  Care Tool:  Bathing    Body parts bathed by patient: Left arm, Chest, Abdomen, Front perineal area, Right upper leg, Left upper leg, Face, Right lower leg, Left lower leg   Body parts bathed by helper: Right arm, Buttocks     Bathing assist Assist Level: Moderate Assistance - Patient 50 - 74% Assistive Device Comment: LH sponge   Upper Body Dressing/Undressing Upper body dressing   What is the patient wearing?: Pull over shirt    Upper body assist Assist Level: Set up assist    Lower Body Dressing/Undressing Lower body dressing      What is the patient wearing?: Underwear/pull up     Lower body assist Assist for lower body dressing: Minimal Assistance - Patient > 75% Assistive Device Comment: reacher   Toileting Toileting Toileting Activity did not occur (Clothing management and hygiene only): N/A (no void or bm)  Toileting assist Assist for toileting: Moderate  Assistance - Patient 50 - 74%     Transfers Chair/bed transfer  Transfers assist     Chair/bed transfer assist level: Moderate Assistance - Patient 50 - 74%     Locomotion Ambulation   Ambulation assist   Ambulation activity did not occur: Safety/medical concerns  Assist level: Moderate Assistance - Patient 50 - 74% Assistive device: Other (comment) (R guardrail) Max distance: 25'   Walk 10 feet activity   Assist  Walk 10 feet activity did not occur: Safety/medical concerns  Assist level: Moderate Assistance - Patient - 50 - 74% Assistive device: Other (comment) (R guardrail)   Walk 50 feet activity   Assist Walk 50 feet with 2 turns activity did not occur: Safety/medical  concerns         Walk 150 feet activity   Assist Walk 150 feet activity did not occur: Safety/medical concerns         Walk 10 feet on uneven surface  activity   Assist Walk 10 feet on uneven surfaces activity did not occur: Safety/medical concerns         Wheelchair     Assist Is the patient using a wheelchair?: Yes Type of Wheelchair: Manual    Wheelchair assist level: Dependent - Patient 0% Max wheelchair distance: 150'    Wheelchair 50 feet with 2 turns activity    Assist        Assist Level: Dependent - Patient 0%   Wheelchair 150 feet activity     Assist      Assist Level: Dependent - Patient 0%   Blood pressure (!) 171/84, pulse 60, temperature 97.6 F (36.4 C), resp. rate 16, height 6' 0.99" (1.854 m), weight 129 kg, SpO2 97 %.    Medical Problem List and Plan: 1. 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 ?embolic. 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             -30 day outpatient cardiac event monitor recommended to r/o a fib              -patient may  shower             -ELOS/Goals: 04/15/21, supervision to min assist goals with PT and OT  -Continue CIR therapies including PT, OT    2.  Antithrombotics: -DVT/anticoagulation:  Mechanical: Sequential compression devices, below knee Bilateral lower extremities Pharmaceutical: Lovenox             -antiplatelet therapy: aspirin 81 mg 3. Pain Management: Tylenol  -OA right knee with associated pain   -continue with voltaren gel 4. Mood: neuropsych has seen him               -antipsychotic agents: n/a 5. Neuropsych: This patient is capable of making decisions on his own behalf. 6. Skin/Wound Care: routine skin checks.  --Monitor BLE ulcerations. 7. Fluids/Electrolytes/Nutrition: eating well  -protein supp for hypoalbuminemia        8.  Hypertension: continue valsartan and coreg.    -diet discussed   coreg increased to 12.5mg  with borderline  control. Consider increase Monday if persistent 9. 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 Hgb A1c = 11.1             -monitor cbg's, adjust regimen as needed  Carb modified diet.   CBG (last 3)  Recent Labs    04/06/21 1652 04/06/21 2118 04/07/21  [id number]  GLUCAP 105* 172* 167*         1/20 fair control.  Had an isolated 350 reading at lunch. No changes at this point. Control has been improving        levemir  39u bid-  10: Vitamin D deficiency: started on 1,000mg  daily supplement.  11: Hypothyroidism: continue Synthroid and recheck labs in 6 weeks 12: Peripheral neuropathy: continue gabapentin 300 mg twice daily at home 13: Herpes zoster of chest: continue Valtrex for total of 2-3 weeks 14: OSA: home BiPAP, 2L O2  at night and naps 15: History of depression: Cymbalta listed on home meds 16: Tobacco use: Offer nicotine patch and provide cessation counseling 17: EtOH use:   Magnesium level reviewed and was 2.2 on 12/21.  Counseling as appropriate 18: Obesity BMI 37.53: Dietary consultation. Provided list of foods that can help with weight loss.  19: Venous insufficiency, bilateral lower extremities: Has had treatment for venous stasis ulcers including Unna boots [facility].  Currently with small superficial ulcerations bilateral lower legs.  He routinely wears compression stockings. Legs are stable  1/10 resumed lasix 1/16 edema has improved 20. 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   -take meds with food/drink   LOS: 21 days A FACE TO FACE EVALUATION WAS PERFORMED  Ranelle OysterZachary T Gia Lusher 04/07/2021, 10:09 AM

## 2021-04-07 NOTE — Progress Notes (Signed)
Occupational Therapy Session Note  Patient Details  Name: Joseph Ortega MRN: [id number] Date of Birth: 12-12-1955  Today's Date: 04/07/2021 OT Individual Time: 1301-1402 OT Individual Time Calculation (min): 61 min    Short Term Goals: Week 3:  OT Short Term Goal 1 (Week 3): Patient will complete toilet transfer with MOd A OT Short Term Goal 2 (Week 3): Patient will complete 1 step of toileting task OT Short Term Goal 3 (Week 3): Patient will stand at the sink with consitent mod A of 1 in preparation for BADL tasks.  Skilled Therapeutic Interventions/Progress Updates:    Pt in wheelchair to start session with transfer down to the therapy gym and over to a mat with mod assist squat pivot.  Worked on activation of the LUE to assist with bilateral and reciprical scooting forward and backwards on the therapy mat.  He was able to scoot with min assist using the LUE for weightbearing.  Emphasis on forward weightshift while maintaining anterior pelvic tilt for all transitional movements.  Transitioned to supine as well with mod assist for work on rolling to each side.  He needed min assist for rolling to the right while RUE was supported.  Educated pt on the importance of lifting his head off of the pillow to help activate abdominal muscles to assist with rolling.  He was able to roll to the left with supervision.  Min to mod assist needed for working on transition from left sidelying into sitting with use of the LUE to assist.  He is able to demonstrate active elbow extension for transitions from left lean on his forearm to sitting upright with min assist.  He was able to complete sit to squat transitions with mod facilitation and maintain while completing left to right weightshifts with LUE in weightbearing on therapist's knee as well as mat.  Finished session with scooting up the mat to the left for transfer back to the wheelchair at mod assist squat pivot.  Pt was returned to his room where he was left  with the call button and phone in reach and safety alarm pad in place.    Therapy Documentation Precautions:  Precautions Precautions: Fall Precaution Comments: Lt hemi with Lt sided edema Restrictions Weight Bearing Restrictions: No  Pain: Pain Assessment Pain Scale: Faces Pain Score: 0-No pain ADL: See Care Tool Section for some details of mobility and selfcare  Therapy/Group: Individual Therapy  Adilson Grafton OTR/L 04/07/2021, 4:26 PM

## 2021-04-07 NOTE — Progress Notes (Signed)
Occupational Therapy Session Note  Patient Details  Name: Joseph Ortega MRN: [id number] Date of Birth: 17-Dec-1955  Today's Date: 04/07/2021 OT Individual Time: 1045-1200 OT Individual Time Calculation (min): 75 min   Short Term Goals: Week 3:  OT Short Term Goal 1 (Week 3): Patient will complete toilet transfer with MOd A OT Short Term Goal 2 (Week 3): Patient will complete 1 step of toileting task OT Short Term Goal 3 (Week 3): Patient will stand at the sink with consitent mod A of 1 in preparation for BADL tasks.  Skilled Therapeutic Interventions/Progress Updates:    Pt greeted seated in wc and agreeable to OT treatment session. Pt wanted to shower today. Worked on stand-pivot transfers into shower with heavy use of grab bars and min/mod A with facilitation at hips and trunk due to lean to the L. Pt able to use reacher to doff clothing in shower. Bathing completed on bariatric BSC using cutout in commode to wash biuttocks and peri-area. Min A to stand-pivot out of shower to stronger R side. Worked on LB dressing strategies with pt having more difficulty lifting LLE into pants due to LE fatigue. Educated on other uses of reacher and ways to modify and be successful with threading pant legs. Sit<>stand at the sink with much improved anterior weight shift and min A. Min A for balance while pulling pants up in standing. Pt wanted to try a different way to don shirt today. Pt wanted to try threading R UE first, which he ended up being able to problem solve getting shirt on, but it took longer than the hemi techniques we have been practicing. Worked on donning socks with LE's propped up on bed using one handed technique. Pt able to get R sock on with increased effort. OT assist to position LLE onto bed, and min A to thread sock. Pt left seated in wc with alarm belt on, call bell in reach, and needs met.  OT placed SAEBO e-stim on shoulder. SAEBO left on for 60 minutes. OT returned to remove SAEBO with  skin intact and no adverse reactions.  Saebo Stim One 330 pulse width 35 Hz pulse rate On 8 sec/ off 8 sec Ramp up/ down 2 sec Symmetrical Biphasic wave form  Max intensity 154m at 500 Ohm load   Therapy Documentation Precautions:  Precautions Precautions: Fall Precaution Comments: Lt hemi with Lt sided edema Restrictions Weight Bearing Restrictions: No Pain: Pain Assessment Pain Scale: 0-10 Pain Score: 0-No pain   Therapy/Group: Individual Therapy  EValma Cava1/20/2023, 12:54 PM

## 2021-04-07 NOTE — Progress Notes (Signed)
Physical Therapy Weekly Progress Note  Patient Details  Name: Joseph Ortega MRN: [id number] Date of Birth: 12-05-1955  Beginning of progress report period: March 31, 2021 End of progress report period: April 07, 2021  Today's Date: 04/07/2021 PT Individual Time: 0902-1014 PT Individual Time Calculation (min): 72 min   Patient has met 2 of 4 short term goals.  Pt is progressing well toward mobility goals, greatly increasing ambulation distance and independence with ambulation with a RW and L hand splint. Pt is still requiring minA to [place] with tranfers, depending on how effective pt sequencing transfer and ability to adequately shift weight anteriorly. Pt has tendency for posterior bias, impairing ability to perform transfers consistently and resulting in occasional LOBs while standing. Pt will benefity from family education prior to DC.  Patient continues to demonstrate the following deficits muscle weakness, decreased cardiorespiratoy endurance, impaired timing and sequencing, decreased attention to left, and decreased sitting balance, decreased standing balance, decreased postural control, and hemiplegia and therefore will continue to benefit from skilled PT intervention to increase functional independence with mobility.  Patient progressing toward long term goals..  Continue plan of care.  PT Short Term Goals Week 3:  PT Short Term Goal 1 (Week 3): Pt will consistently perform bed mobility with CGA. PT Short Term Goal 1 - Progress (Week 3): Met PT Short Term Goal 2 (Week 3): Pt will consistently perform sit to stand with minA. PT Short Term Goal 2 - Progress (Week 3): Progressing toward goal PT Short Term Goal 3 (Week 3): Pt will consistently perform bed to chair transfer with minA. PT Short Term Goal 3 - Progress (Week 3): Progressing toward goal PT Short Term Goal 4 (Week 3): Pt will ambulate x50' with modA +1 and LRAD. PT Short Term Goal 4 - Progress (Week 3): Met Week 4:  PT Short  Term Goal 1 (Week 4): STGs = LTGs  Skilled Therapeutic Interventions/Progress Updates:  Ambulation/gait training;Community reintegration;DME/adaptive equipment instruction;Neuromuscular re-education;Psychosocial support;Stair training;UE/LE Strength taining/ROM;Wheelchair propulsion/positioning;Balance/vestibular training;Discharge planning;Functional electrical stimulation;Pain management;Skin care/wound management;Therapeutic Activities;UE/LE Coordination activities;Cognitive remediation/compensation;Disease management/prevention;Patient/family education;Functional mobility training;Splinting/orthotics;Therapeutic Exercise;Visual/perceptual remediation/compensation   Pt received supine in bed and agrees to therapy. No complaint of pain. Pt performs supine to sit with bed features and verbal cues for positioning at EOB. PT provides maxA to don shoes prior to mobility. Sit to stand form elevated bed with minA to facilitate anterior weight shift, with cues for body mechanics and sequencing. Pt ambulates bouts of 162', 176', and 193' with extended seated rest breaks in between each. PT generally provides minA throughout with multimodal cues for maintaining upright gaze to improve posture and balance, shifting weight further anteriorly due to pt's posterior bias, maintaining adequate stance width due to pt tendency to adduct L leg during swing phase (though this is improving a lot!), and ensuring that L lower extremity hip and extensor muscles are engage during stance phase. Pt has 2-3 instances requiring modA to maintain balance due to posterior bias, and x1 instance of complete LOB backward, requiring PT to guide pt back to [facility] for safety. Pt has +2 WC follow for safety with all ambulation.   Pt left seated in WC with alarm intact and all needs within reach.  Therapy Documentation Precautions:  Precautions Precautions: Fall Precaution Comments: Lt hemi with Lt sided edema Restrictions Weight Bearing  Restrictions: No  Therapy/Group: Individual Therapy  Breck Coons, PT, DPT 04/07/2021, 4:45 PM

## 2021-04-07 NOTE — Plan of Care (Signed)
°  Problem: Consults Goal: RH STROKE PATIENT EDUCATION Description: See Patient Education module for education specifics  Outcome: Progressing Goal: Diabetes Guidelines if Diabetic/Glucose > 140 Description: If diabetic or lab glucose is > 140 mg/dl - Initiate Diabetes/Hyperglycemia Guidelines & Document Interventions  Outcome: Progressing   Problem: RH SKIN INTEGRITY Goal: RH STG MAINTAIN SKIN INTEGRITY WITH ASSISTANCE Description: STG Maintain Skin Integrity With Supervision Assistance. Outcome: Progressing Goal: RH STG ABLE TO PERFORM INCISION/WOUND CARE W/ASSISTANCE Description: STG Able To Perform Incision/Wound Care With Supervision Assistance. Outcome: Progressing   Problem: RH SAFETY Goal: RH STG ADHERE TO SAFETY PRECAUTIONS W/ASSISTANCE/DEVICE Description: STG Adhere to Safety Precautions With Cues and Reminders. Outcome: Progressing Goal: RH STG DECREASED RISK OF FALL WITH ASSISTANCE Description: STG Decreased Risk of Fall With Supervision Assistance. Outcome: Progressing   Problem: RH PAIN MANAGEMENT Goal: RH STG PAIN MANAGED AT OR BELOW PT'S PAIN GOAL Description: < 3 on a 0-10 pain scale. Outcome: Progressing   Problem: RH KNOWLEDGE DEFICIT Goal: RH STG INCREASE KNOWLEDGE OF DIABETES Description: Patient will demonstrate knowledge of diabetes management, dietary recommendations, and insulin management with educational materials and handouts provided by staff independently at discharge. Outcome: Progressing Goal: RH STG INCREASE KNOWLEDGE OF HYPERTENSION Description: Patient will demonstrate knowledge of HTN medications, dietary recommendations, and BP management with educational materials and handouts provided by staff independently at discharge. Outcome: Progressing Goal: RH STG INCREASE KNOWLEGDE OF HYPERLIPIDEMIA Description: Patient will demonstrate knowledge of HLD medications, cholesterol dietary recommendations, and HLD lab values with educational materials and  handouts provided by staff independently at discharge. Outcome: Progressing Goal: RH STG INCREASE KNOWLEDGE OF STROKE PROPHYLAXIS Description: Patient will demonstrate knowledge of medications used to aid in prevention of future strokes with educational materials and handouts provided by staff independently at discharge. Outcome: Progressing

## 2021-04-08 LAB — GLUCOSE, CAPILLARY
Glucose-Capillary: 111 mg/dL — ABNORMAL HIGH (ref 70–99)
Glucose-Capillary: 190 mg/dL — ABNORMAL HIGH (ref 70–99)
Glucose-Capillary: 136 mg/dL — ABNORMAL HIGH (ref 70–99)
Glucose-Capillary: 102 mg/dL — ABNORMAL HIGH (ref 70–99)

## 2021-04-08 MED ORDER — CARVEDILOL 12.5 MG PO TABS: 25.0000 mg | ORAL_TABLET | Freq: Two times a day (BID) | ORAL | Status: DC

## 2021-04-08 MED ORDER — CARVEDILOL 12.5 MG PO TABS
12.5000 mg | ORAL_TABLET | Freq: Two times a day (BID) | ORAL | Status: DC
  Administered 2021-04-08 – 2021-04-18 (×20): 12.5 mg via ORAL
  Filled 2021-04-08 (×20): qty 1

## 2021-04-08 NOTE — Progress Notes (Signed)
PROGRESS NOTE   Subjective/Complaints: No new complaints this morning Sleepy Patient's chart reviewed- No issues reported overnight Vitals signs stable except for elevated SBP  ROS: Patient denies fever, rash, sore throat, blurred vision, nausea, vomiting, diarrhea, cough, shortness of breath or chest pain, joint or back pain, headache, or mood change.    Objective:   No results found. No results for input(s): WBC, HGB, HCT, PLT in the last 72 hours.   No results for input(s): NA, K, CL, CO2, GLUCOSE, BUN, CREATININE, CALCIUM in the last 72 hours.     Intake/Output Summary (Last 24 hours) at 04/08/2021 1610 Last data filed at 04/08/2021 0820 Gross per 24 hour  Intake 118 ml  Output 525 ml  Net -407 ml        Physical Exam: Vital Signs Blood pressure (!) 142/69, pulse 70, temperature 99.2 F (37.3 C), temperature source Oral, resp. rate 15, height 6' 0.99" (1.854 m), weight 129 kg, SpO2 99 %.  Gen: no distress, normal appearing HEENT: oral mucosa pink and moist, NCAT Cardio: Reg rate Chest: normal effort, normal rate of breathing Abd: soft, non-distended Ext: no edema Psych: pleasant, normal affect Skin: intact  Musculoskeletal: left hand with 1+ edema--k-tape     trace   LE edema B/L  Right knee with crepitus and but no effusion  Neurological:     Mental Status: He is alert.     Comments: Alert and oriented x 3. Normal insight and awareness. Intact Memory. Normal language and speech. Cranial nerve exam unremarkable except for LLQ quadrantanopsia ongoing. LUE 2- /5 pec, biceps and 2- /5 wrist and finger flexion, 1/5 wrist extension.    LLE 2+ to 3-/5 HE, KE and 1+/5 PF, 0-tr/5 DF. RUE and RLE grossly 5/5. Gradual improvement in motor exam       Assessment/Plan: 1. Functional deficits which require 3+ hours per day of interdisciplinary therapy in a comprehensive inpatient rehab setting. Physiatrist is  providing close team supervision and 24 hour management of active medical problems listed below. Physiatrist and rehab team continue to assess barriers to discharge/monitor patient progress toward functional and medical goals  Care Tool:  Bathing    Body parts bathed by patient: Left arm, Chest, Abdomen, Front perineal area, Right upper leg, Left upper leg, Face, Right lower leg, Left lower leg   Body parts bathed by helper: Right arm, Buttocks     Bathing assist Assist Level: Moderate Assistance - Patient 50 - 74% Assistive Device Comment: LH sponge   Upper Body Dressing/Undressing Upper body dressing   What is the patient wearing?: Pull over shirt    Upper body assist Assist Level: Set up assist    Lower Body Dressing/Undressing Lower body dressing      What is the patient wearing?: Underwear/pull up     Lower body assist Assist for lower body dressing: Minimal Assistance - Patient > 75% Assistive Device Comment: reacher   Toileting Toileting Toileting Activity did not occur (Clothing management and hygiene only): N/A (no void or bm)  Toileting assist Assist for toileting: Moderate Assistance - Patient 50 - 74%     Transfers Chair/bed transfer  Transfers assist  Chair/bed transfer assist level: Moderate Assistance - Patient 50 - 74%     Locomotion Ambulation   Ambulation assist   Ambulation activity did not occur: Safety/medical concerns  Assist level: Moderate Assistance - Patient 50 - 74% Assistive device: Other (comment) (R guardrail) Max distance: 25'   Walk 10 feet activity   Assist  Walk 10 feet activity did not occur: Safety/medical concerns  Assist level: Moderate Assistance - Patient - 50 - 74% Assistive device: Other (comment) (R guardrail)   Walk 50 feet activity   Assist Walk 50 feet with 2 turns activity did not occur: Safety/medical concerns         Walk 150 feet activity   Assist Walk 150 feet activity did not occur:  Safety/medical concerns         Walk 10 feet on uneven surface  activity   Assist Walk 10 feet on uneven surfaces activity did not occur: Safety/medical concerns         Wheelchair     Assist Is the patient using a wheelchair?: Yes Type of Wheelchair: Manual    Wheelchair assist level: Dependent - Patient 0% Max wheelchair distance: 150'    Wheelchair 50 feet with 2 turns activity    Assist        Assist Level: Dependent - Patient 0%   Wheelchair 150 feet activity     Assist      Assist Level: Dependent - Patient 0%   Blood pressure (!) 142/69, pulse 70, temperature 99.2 F (37.3 C), temperature source Oral, resp. rate 15, height 6' 0.99" (1.854 m), weight 129 kg, SpO2 99 %.    Medical Problem List and Plan: 1. 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 ?embolic. 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             -30 day outpatient cardiac event monitor recommended to r/o a fib              -patient may  shower             -ELOS/Goals: 04/15/21, supervision to min assist goals with PT and OT  Continue CIR therapies including PT, OT    2.  Antithrombotics: -DVT/anticoagulation:  Mechanical: Sequential compression devices, below knee Bilateral lower extremities Pharmaceutical: Lovenox             -antiplatelet therapy: aspirin 81 mg 3. Pain Management: Tylenol  -OA right knee with associated pain   -continue with voltaren gel 4. Mood: neuropsych has seen him               -antipsychotic agents: n/a 5. Neuropsych: This patient is capable of making decisions on his own behalf. 6. Skin/Wound Care: routine skin checks.  --Monitor BLE ulcerations. 7. Fluids/Electrolytes/Nutrition: eating well  -protein supp for hypoalbuminemia        8. Hypertension: continue Avapro and coreg.    -diet discussed   coreg  increased to 12.5mg  with borderline  control. Consider increase Monday if persistent 9. 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 Hgb A1c = 11.1             -monitor cbg's, adjust regimen as needed  Carb modified diet.   CBG (last 3)  Recent Labs    04/07/21 2103 04/08/21 0612 04/08/21 1141  GLUCAP 101* 111* 190*         1/20 fair control.  Had an isolated 350 reading at lunch. No changes at this point. Control has been improving        1/21: continue levemir  39u bid-  10: Vitamin D deficiency: started on 1,000mg  daily supplement.  11: Hypothyroidism: continue Synthroid and recheck labs in 6 weeks 12: Peripheral neuropathy: continue gabapentin 300 mg twice daily at home 13: Herpes zoster of chest: continue Valtrex for total of 2-3 weeks 14: OSA: home BiPAP, 2L O2  at night and naps 15: History of depression: Cymbalta listed on home meds 16: Tobacco use: Offer nicotine patch and provide cessation counseling 17: EtOH use:   Magnesium level reviewed and was 2.2 on 12/21.  Counseling as appropriate 18: Obesity BMI 37.53: Dietary consultation. Provided list of foods that can help with weight loss.  19: Venous insufficiency, bilateral lower extremities: Has had treatment for venous stasis ulcers including Unna boots and follow-up at [facility] wound care center.  Currently with small superficial ulcerations bilateral lower legs.  He routinely wears compression stockings. Legs are stable  1/10 resumed lasix 1/16 edema has improved 20. 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   -take meds with food/drink   LOS: 22 days A FACE TO FACE EVALUATION WAS PERFORMED  Joseph Ortega Joseph Ortega 04/08/2021, 4:10 PM

## 2021-04-08 NOTE — Plan of Care (Signed)
°  Problem: Consults Goal: RH STROKE PATIENT EDUCATION Description: See Patient Education module for education specifics  Outcome: Progressing Goal: Diabetes Guidelines if Diabetic/Glucose > 140 Description: If diabetic or lab glucose is > 140 mg/dl - Initiate Diabetes/Hyperglycemia Guidelines & Document Interventions  Outcome: Progressing   Problem: RH SKIN INTEGRITY Goal: RH STG MAINTAIN SKIN INTEGRITY WITH ASSISTANCE Description: STG Maintain Skin Integrity With Supervision Assistance. Outcome: Progressing Goal: RH STG ABLE TO PERFORM INCISION/WOUND CARE W/ASSISTANCE Description: STG Able To Perform Incision/Wound Care With Supervision Assistance. Outcome: Progressing   Problem: RH SAFETY Goal: RH STG ADHERE TO SAFETY PRECAUTIONS W/ASSISTANCE/DEVICE Description: STG Adhere to Safety Precautions With Cues and Reminders. Outcome: Progressing Goal: RH STG DECREASED RISK OF FALL WITH ASSISTANCE Description: STG Decreased Risk of Fall With Supervision Assistance. Outcome: Progressing   Problem: RH PAIN MANAGEMENT Goal: RH STG PAIN MANAGED AT OR BELOW PT'S PAIN GOAL Description: < 3 on a 0-10 pain scale. Outcome: Progressing   Problem: RH KNOWLEDGE DEFICIT Goal: RH STG INCREASE KNOWLEDGE OF DIABETES Description: Patient will demonstrate knowledge of diabetes management, dietary recommendations, and insulin management with educational materials and handouts provided by staff independently at discharge. Outcome: Progressing Goal: RH STG INCREASE KNOWLEDGE OF HYPERTENSION Description: Patient will demonstrate knowledge of HTN medications, dietary recommendations, and BP management with educational materials and handouts provided by staff independently at discharge. Outcome: Progressing Goal: RH STG INCREASE KNOWLEGDE OF HYPERLIPIDEMIA Description: Patient will demonstrate knowledge of HLD medications, cholesterol dietary recommendations, and HLD lab values with educational materials and  handouts provided by staff independently at discharge. Outcome: Progressing Goal: RH STG INCREASE KNOWLEDGE OF STROKE PROPHYLAXIS Description: Patient will demonstrate knowledge of medications used to aid in prevention of future strokes with educational materials and handouts provided by staff independently at discharge. Outcome: Progressing

## 2021-04-09 LAB — GLUCOSE, CAPILLARY
Glucose-Capillary: 70 mg/dL (ref 70–99)
Glucose-Capillary: 182 mg/dL — ABNORMAL HIGH (ref 70–99)
Glucose-Capillary: 146 mg/dL — ABNORMAL HIGH (ref 70–99)

## 2021-04-09 NOTE — Plan of Care (Signed)
°  Problem: Consults °Goal: RH STROKE PATIENT EDUCATION °Description: See Patient Education module for education specifics  °Outcome: Progressing °Goal: Diabetes Guidelines if Diabetic/Glucose > 140 °Description: If diabetic or lab glucose is > 140 mg/dl - Initiate Diabetes/Hyperglycemia Guidelines & Document Interventions  °Outcome: Progressing °  °Problem: RH SKIN INTEGRITY °Goal: RH STG MAINTAIN SKIN INTEGRITY WITH ASSISTANCE °Description: STG Maintain Skin Integrity With Supervision Assistance. °Outcome: Progressing °Goal: RH STG ABLE TO PERFORM INCISION/WOUND CARE W/ASSISTANCE °Description: STG Able To Perform Incision/Wound Care With Supervision Assistance. °Outcome: Progressing °  °Problem: RH SAFETY °Goal: RH STG ADHERE TO SAFETY PRECAUTIONS W/ASSISTANCE/DEVICE °Description: STG Adhere to Safety Precautions With Cues and Reminders. °Outcome: Progressing °Goal: RH STG DECREASED RISK OF FALL WITH ASSISTANCE °Description: STG Decreased Risk of Fall With Supervision Assistance. °Outcome: Progressing °  °Problem: RH PAIN MANAGEMENT °Goal: RH STG PAIN MANAGED AT OR BELOW PT'S PAIN GOAL °Description: < 3 on a 0-10 pain scale. °Outcome: Progressing °  °Problem: RH KNOWLEDGE DEFICIT °Goal: RH STG INCREASE KNOWLEDGE OF DIABETES °Description: Patient will demonstrate knowledge of diabetes management, dietary recommendations, and insulin management with educational materials and handouts provided by staff independently at discharge. °Outcome: Progressing °Goal: RH STG INCREASE KNOWLEDGE OF HYPERTENSION °Description: Patient will demonstrate knowledge of HTN medications, dietary recommendations, and BP management with educational materials and handouts provided by staff independently at discharge. °Outcome: Progressing °Goal: RH STG INCREASE KNOWLEGDE OF HYPERLIPIDEMIA °Description: Patient will demonstrate knowledge of HLD medications, cholesterol dietary recommendations, and HLD lab values with educational materials and  handouts provided by staff independently at discharge. °Outcome: Progressing °Goal: RH STG INCREASE KNOWLEDGE OF STROKE PROPHYLAXIS °Description: Patient will demonstrate knowledge of medications used to aid in prevention of future strokes with educational materials and handouts provided by staff independently at discharge. °Outcome: Progressing °  °

## 2021-04-09 NOTE — Progress Notes (Signed)
Physical Therapy Session Note  Patient Details  Name: Joseph Ortega MRN: [id number] Date of Birth: January 01, 1956  Today's Date: 04/09/2021 PT Individual Time: 1000-1055 PT Individual Time Calculation (min): 55 min   Short Term Goals: Week 4:  PT Short Term Goal 1 (Week 4): STGs = LTGs  Skilled Therapeutic Interventions/Progress Updates:    Pt received supine in bed, agreeable to PT session. No complaints of pain. Pt is dependent to don knee high TEDs and tennis shoes at bed level. Supine to sit with CGA with HOB slightly elevated and heavy reliance on bedrail, assist needed for trunk elevation/control. Stand pivot transfer with no AD to the L to w/c with max A. Dependent transport via w/c to/from therapy gym for time and energy conservation. Sit to stand with max A to RW from w/c with assist needed to place LUE in hand orthosis. Ambulation x 100 ft, x 118 ft with RW and mod A overall with close w/c follow for safety. Pt has several near LOB due to L toes catching in floor during gait, able to prevent fall with mod A. Pt also exhibits some abduction of LLE during gait but overall improved ability to advance limb during gait. Placed shoe cover over L toes during 2nd round of ambulation with improved foot clearance noted. Sit to stand x 10 reps from elevated mat table with RW and mod A improving to min A with cues for anterior weight shift. Pt requests to return to bed at end of session due to fatigue. Stand pivot transfer w/c to bed with RW and mod A. Sit to supine mod A for BLE management. Pt left seated in bed with needs in reach, bed alarm in place at end of session.  Therapy Documentation Precautions:  Precautions Precautions: Fall Precaution Comments: Lt hemi with Lt sided edema Restrictions Weight Bearing Restrictions: No       Therapy/Group: Individual Therapy   Peter Congo, PT, DPT, CSRS  04/09/2021, 12:47 PM

## 2021-04-09 NOTE — Progress Notes (Signed)
Occupational Therapy Session Note  Patient Details  Name: Joseph Ortega MRN: [id number] Date of Birth: 07/26/1955  Today's Date: 04/09/2021 OT Individual Time: 1300-1400 OT Individual Time Calculation (min): 60 min    Short Term Goals: Week 1:  OT Short Term Goal 1 (Week 1): Pt will complete BSC transfer with +2 assist to progress with OOB toileting OT Short Term Goal 1 - Progress (Week 1): Met OT Short Term Goal 2 (Week 1): Pt will complete sit<stand in Stedy with no more than Mod A during 2 consecutive ADL sessions OT Short Term Goal 2 - Progress (Week 1): Met OT Short Term Goal 3 (Week 1): Pt will assist with 1/3 components of toileting with no more than Mod balance assist OT Short Term Goal 3 - Progress (Week 1): Met Week 2:  OT Short Term Goal 1 (Week 2): Patient will use L UE as a stabilizer within grooming task with min questioning cues OT Short Term Goal 1 - Progress (Week 2): Met OT Short Term Goal 2 (Week 2): Patient will perform sit<>stand at the sink in preparation for BADL task with max A of 1 (without STedy) OT Short Term Goal 2 - Progress (Week 2): Met Week 3:  OT Short Term Goal 1 (Week 3): Patient will complete toilet transfer with MOd A OT Short Term Goal 2 (Week 3): Patient will complete 1 step of toileting task OT Short Term Goal 3 (Week 3): Patient will stand at the sink with consitent mod A of 1 in preparation for BADL tasks.  Skilled Therapeutic Interventions/Progress Updates:    1:1 NMR with focus on normal movement of left UE. Pt received in bed and performed stand pivot transfer with mod A into w/c. Pt able was self propel with bilateral LEs for ~ 30 feet on the way to the gym. In the gym,  focused on grasp and release/ wrist extension/flexion/ supination/ pronation with forearm supported on table while crumpling paper and releasing off the end of the table. Pt very tight supinator muscles- education on stretching them. Transitioned to the mat with min  A squat pivot  and transitioned into supine with minA. Focus on movements with control in gravity assisted plane. Also reaching (AAROM) to end ranges in all directions of movement of UE. Re-kineotaped his left UE.  Returned to sitting with facilitation of normal patterns of movements instead of compensation.  Handed off to next therapist   Therapy Documentation Precautions:  Precautions Precautions: Fall Precaution Comments: Lt hemi with Lt sided edema Restrictions Weight Bearing Restrictions: No  Pain: Pain Assessment Pain Scale: 0-10 Pain Score: 0-No pain    Therapy/Group: Individual Therapy  Willeen Cass [facility] 04/09/2021, 2:54 PM

## 2021-04-09 NOTE — Progress Notes (Signed)
Physical Therapy Session Note  Patient Details  Name: Joseph Ortega MRN: [id number] Date of Birth: 1955-08-21  Today's Date: 04/09/2021 PT Individual Time: 3716-9678 PT Individual Time Calculation (min): 55 min   Short Term Goals: Week 1:  PT Short Term Goal 1 (Week 1): Pt will perform be mobility with modA. PT Short Term Goal 1 - Progress (Week 1): Met PT Short Term Goal 2 (Week 1): Pt will perform sit to stand with maxA +1. PT Short Term Goal 2 - Progress (Week 1): Met PT Short Term Goal 3 (Week 1): Pt will perform bed to chair transfer with modA +1 consistently. PT Short Term Goal 3 - Progress (Week 1): Progressing toward goal PT Short Term Goal 4 (Week 1): Pt will ambulate x25' with modA +2 and LRAD. PT Short Term Goal 4 - Progress (Week 1): Met  Skilled Therapeutic Interventions/Progress Updates:  Pt was seen bedside in the pm. Pt transported to rehab gym for time management. Pt performed multiple sit to stand transfers with rolling walker and mod A with verbal cues. Performed seated LAQs and hip flex 3 sets x 10 reps each. Performed step taps 3 sets x 10 reps each. Performed arm chair push ups 2 sets x 5 reps each. Pt propelled w.c about 100 feet with B LEs and S with verbal cues. Pt left sitting up in w/c with chair alarm in place.   Therapy Documentation Precautions:  Precautions Precautions: Fall Precaution Comments: Lt hemi with Lt sided edema Restrictions Weight Bearing Restrictions: No General:   Pain: Pain Assessment Pain Scale: 0-10 Pain Score: 0-No pain  Therapy/Group: Individual Therapy  Dub Amis 04/09/2021, 3:40 PM

## 2021-04-10 LAB — GLUCOSE, CAPILLARY
Glucose-Capillary: 138 mg/dL — ABNORMAL HIGH (ref 70–99)
Glucose-Capillary: 99 mg/dL (ref 70–99)
Glucose-Capillary: 183 mg/dL — ABNORMAL HIGH (ref 70–99)
Glucose-Capillary: 54 mg/dL — ABNORMAL LOW (ref 70–99)
Glucose-Capillary: 111 mg/dL — ABNORMAL HIGH (ref 70–99)
Glucose-Capillary: 233 mg/dL — ABNORMAL HIGH (ref 70–99)

## 2021-04-10 MED ORDER — DAPAGLIFLOZIN PROPANEDIOL 5 MG PO TABS
5.0000 mg | ORAL_TABLET | Freq: Every day | ORAL | Status: DC
  Administered 2021-04-10 – 2021-04-18 (×9): 5 mg via ORAL
  Filled 2021-04-10 (×9): qty 1

## 2021-04-10 MED ORDER — HYDROCHLOROTHIAZIDE 25 MG PO TABS
25.0000 mg | ORAL_TABLET | Freq: Every day | ORAL | Status: DC
  Administered 2021-04-10 – 2021-04-18 (×9): 25 mg via ORAL
  Filled 2021-04-10 (×9): qty 1

## 2021-04-10 NOTE — Progress Notes (Signed)
Placed patient on BIPAP for HS. Previous home settings. Patient tolerating well at this time.

## 2021-04-10 NOTE — Progress Notes (Signed)
Hypoglycemic Event  CBG: 54  Treatment: 8 oz juice/soda  Symptoms: None  Follow-up CBG: Time:1735  CBG Result:111  Possible Reasons for Event: Change in activity  Comments/MD notified:Swartz    Joseph Ortega

## 2021-04-10 NOTE — Progress Notes (Signed)
Occupational Therapy Weekly Progress Note  Patient Details  Name: Joseph Ortega MRN: [id number] Date of Birth: 07-24-1955  Beginning of progress report period: March 18, 2021 End of progress report period: April 10, 2021  Today's Date: 04/10/2021 OT Individual Time: 1058-1200 OT Individual Time Calculation (min): 62 min    Patient has met 3 of 3 short term goals.  Patient is making steady progress towards OT goals. He is more consistent with sit<>stand for transfers with grab bar use and min/mod A. He can perform stand-pivot toilet transfers at [place], but working towards min A level. L UE function is improving with increased shoulder/scapula activation, improved gross grasp, and wrist extension slightly past neutral. He is still mostly only using L hand a stabilizer, but is integrating use more within functional tasks. Continue current POC.  Patient continues to demonstrate the following deficits: muscle weakness, impaired timing and sequencing, abnormal tone, unbalanced muscle activation, motor apraxia, ataxia, decreased coordination, and decreased motor planning, and decreased sitting balance, decreased standing balance, decreased postural control, hemiplegia, and decreased balance strategies and therefore will continue to benefit from skilled OT intervention to enhance overall performance with BADL and Reduce care partner burden.  Patient progressing toward long term goals..  Continue plan of care.  OT Short Term Goals Week 3:  OT Short Term Goal 1 (Week 3): Patient will complete toilet transfer with MOd A OT Short Term Goal 1 - Progress (Week 3): Met OT Short Term Goal 2 (Week 3): Patient will complete 1 step of toileting task OT Short Term Goal 2 - Progress (Week 3): Met OT Short Term Goal 3 (Week 3): Patient will stand at the sink with consitent mod A of 1 in preparation for BADL tasks. OT Short Term Goal 3 - Progress (Week 3): Met Week 4:  OT Short Term Goal 1 (Week 4): LTG=STG  2.2 ELOS  Skilled Therapeutic Interventions/Progress Updates:    Pt greeted seated in wc and agreeable to OT treatment session. Pt declined need to shower today. Worked on wc propulsion to therapy apartment to practice toilet transfers in simulated home environment. Pt reports he has grab bars all over his house and especially his bathroom. Pt reported vertical and horizontal bars around his commode. Discussed access to bathroom from wheelchair and tried to get wc as close to [facility] over toilet as possible. Pt was then able to stand using grab bar with min A and improved anterior weight shift, pt then needed min A to pivot over to [facility] over toilet. Pt would then need assistance to get pants down. Patient does a lot better keeping his weight forward when he has a bar to pull from. Pt brought to therapy gym and practiced stand-pivot transfer with RW. Pt with more difficulty with weight shift forward standing with a RW requiring mod A and assistance to get L UE up to hand splint. Once up, he could then pivot with min A and RW. L UE NMR with UE ranger focused on shoulder flex/ext, horizontal adduction/abduction. OT provided joint input through wrist and elbow to work on elbow flex/ext, then focused on wrist extension and gross grasp. Pt returned to room and left seated in wc with alarm belt on, call bell in reach, and needs met.   OT placed SAEBO e-stim on shoulder. SAEBO left on for 60 minutes. OT returned to remove SAEBO with skin intact and no adverse reactions.  [place] Stim One 330 pulse width 35 Hz pulse rate On 8 sec/ off  8 sec Ramp up/ down 2 sec Symmetrical Biphasic wave form  Max intensity 161m at 500 Ohm load    Therapy Documentation Precautions:  Precautions Precautions: Fall Precaution Comments: Lt hemi with Lt sided edema Restrictions Weight Bearing Restrictions: No Pain:  Denies pain  Therapy/Group: Individual Therapy  EValma Cava1/23/2023, 12:40 PM

## 2021-04-10 NOTE — Progress Notes (Signed)
Physical Therapy Session Note  Patient Details  Name: Joseph Ortega MRN: [id number] Date of Birth: 12-15-1955  Today's Date: 04/10/2021 PT Individual Time: 1422-1536 PT Individual Time Calculation (min): 74 min   Short Term Goals: Week 1:  PT Short Term Goal 1 (Week 1): Pt will perform be mobility with modA. PT Short Term Goal 1 - Progress (Week 1): Met PT Short Term Goal 2 (Week 1): Pt will perform sit to stand with maxA +1. PT Short Term Goal 2 - Progress (Week 1): Met PT Short Term Goal 3 (Week 1): Pt will perform bed to chair transfer with modA +1 consistently. PT Short Term Goal 3 - Progress (Week 1): Progressing toward goal PT Short Term Goal 4 (Week 1): Pt will ambulate x25' with modA +2 and LRAD. PT Short Term Goal 4 - Progress (Week 1): Met Week 2:  PT Short Term Goal 1 (Week 2): Pt will perform bed mobility consistently with minA. PT Short Term Goal 1 - Progress (Week 2): Met PT Short Term Goal 2 (Week 2): Pt will perform sit to stand consistently with minA. PT Short Term Goal 2 - Progress (Week 2): Progressing toward goal PT Short Term Goal 3 (Week 2): Pt will perform bed to chair consistently with minA. PT Short Term Goal 3 - Progress (Week 2): Progressing toward goal PT Short Term Goal 4 (Week 2): Pt will ambulate x50' with modA +1 and LRAD. PT Short Term Goal 4 - Progress (Week 2): Progressing toward goal Week 3:  PT Short Term Goal 1 (Week 3): Pt will consistently perform bed mobility with CGA. PT Short Term Goal 1 - Progress (Week 3): Met PT Short Term Goal 2 (Week 3): Pt will consistently perform sit to stand with minA. PT Short Term Goal 2 - Progress (Week 3): Progressing toward goal PT Short Term Goal 3 (Week 3): Pt will consistently perform bed to chair transfer with minA. PT Short Term Goal 3 - Progress (Week 3): Progressing toward goal PT Short Term Goal 4 (Week 3): Pt will ambulate x50' with modA +1 and LRAD. PT Short Term Goal 4 - Progress (Week 3):  Met  Skilled Therapeutic Interventions/Progress Updates:    pt received in bed and agreeable to therapy. Pt with c/o muscle soreness in BLE, rest breaks as needed. Bed mobility with supervision and heavy use of bed features. Sit to stand with min A throughout session, as little as CGA when focused on technique at times. Mod A Stand pivot transfer with RW to w/c for balance and weight shift. Session focused on ambulation for improved gait mechanics. Pt ambulated 3 x 100 ft with min A for RW navigation and balance, close w/c follow. 1 LOB while distracted by passersby, heavy mod A to recover. VC for step length and improved hip/knee flexion in stance. D/t to LE fatigue, transitioned to 6 min UBE on hills profile fpr endurance and UE strength. At this time, pt reports urgent need to urinate. Pt transported to room for time, but had small incontinence. Largely continent urine void with urinal. Pt able to doff clothes independently and don shorts and underwear with set up and reacher. Multiple attempts to stand, but pt was unable d/t poor w/c brakes and fatigue. Stedy transfer to stand with CGA, assist to pull up pants before returning to bed with mod A d/t fatigue. Pt was left in bed  with all needs in reach and alarm active.   Therapy Documentation Precautions:  Precautions Precautions: Fall  Precaution Comments: Lt hemi with Lt sided edema Restrictions Weight Bearing Restrictions: No General:   Vital Signs: Therapy Vitals Temp: 98.5 F (36.9 C) Temp Source: Oral Pulse Rate: 64 Resp: 18 BP: (!) 145/63 Patient Position (if appropriate): Sitting Oxygen Therapy SpO2: 94 % O2 Device: Room Air Pain:   Mobility:   Locomotion :    Trunk/Postural Assessment :    Balance:   Exercises:   Other Treatments:      Therapy/Group: Individual Therapy  Mickel Fuchs 04/10/2021, 3:05 PM

## 2021-04-10 NOTE — Progress Notes (Signed)
Patient wearing BIPAP when RT entered room. Pt resting comfortably. RT will monitor as needed.

## 2021-04-10 NOTE — Progress Notes (Signed)
Pt placed on CPAP by this nurse. Pt tolerating well. All needs met. Call light in reach.

## 2021-04-10 NOTE — Progress Notes (Signed)
Pt refuses to change urine soiled clothes/brief . Pt has been offered to change multiple times by this nurse and two nurse techs and has declined each time. Pt states "Its just a little bit i'll be ok" This nurse as well as other nurse staff continue to offer a fresh set of clothing and brief and pt continues to deny. Nursing will continue to offer new clothing and brief throughout the night.

## 2021-04-10 NOTE — Progress Notes (Signed)
Physical Therapy Session Note  Patient Details  Name: Joseph Ortega MRN: [id number] Date of Birth: 1955-11-24  Today's Date: 04/10/2021 PT Individual Time: 0902-0959 PT Individual Time Calculation (min): 57 min   Short Term Goals: Week 4:  PT Short Term Goal 1 (Week 4): STGs = LTGs  Skilled Therapeutic Interventions/Progress Updates:     Pt received supine in bed and agrees to therapy. No complaint of pain. Supine to sit with verbal cues on positioning. Dependent for donning ted hose and shoes. Pt performs sit to stand from elevated bed with minA, but quickly loses balance backward and sits abruptly back on bed. PT educates on importance of shifting weight anteriorly. On 2nd attempt pt completes transfer with RW and modA to facilitate weight shifting. WC transport to gym for time management. Stand step transfer to mat with modA and same cueing. Session focused on NMR for L hemibody and balance, with pt tasked with coming forward into squat position with buttocks cleared from table. Pt typically can achieve position with minA and occasionally CGA. Once in squat, pt performs lateral weight shifting with PT providing hand over hand manual pressure through L hand. Pt progresses to transitioning from low squat to standing. Once pt completes with CGA but more often than not, requires minA/modA to complete. Seated rest breaks between bouts due to leg fatigue. Pt performs sit to stand with RW and minA, then ambulates x30', primarily with minA but with x1 complete LOB backward requiring totalA to maintain balance. WC transport back to room. Left seated with alarm intact and all needs within reach.  Therapy Documentation Precautions:  Precautions Precautions: Fall Precaution Comments: Lt hemi with Lt sided edema Restrictions Weight Bearing Restrictions: No   Therapy/Group: Individual Therapy  Breck Coons, PT, DPT 04/10/2021, 12:57 PM

## 2021-04-10 NOTE — Progress Notes (Addendum)
PROGRESS NOTE   Subjective/Complaints: No new issues. Denies pain. No nausea this weekend.   ROS: Patient denies fever, rash, sore throat, blurred vision, nausea, vomiting, diarrhea, cough, shortness of breath or chest pain, joint or back pain, headache, or mood change.     Objective:   No results found. No results for input(s): WBC, HGB, HCT, PLT in the last 72 hours.   No results for input(s): NA, K, CL, CO2, GLUCOSE, BUN, CREATININE, CALCIUM in the last 72 hours.     Intake/Output Summary (Last 24 hours) at 04/10/2021 1011 Last data filed at 04/10/2021 0842 Gross per 24 hour  Intake 357 ml  Output 1125 ml  Net -768 ml        Physical Exam: Vital Signs Blood pressure (!) 154/75, pulse 62, temperature 98.2 F (36.8 C), resp. rate 16, height 6' 0.99" (1.854 m), weight 129 kg, SpO2 96 %.  Constitutional: No distress . Vital signs reviewed. HEENT: NCAT, EOMI, oral membranes moist Neck: supple Cardiovascular: RRR without murmur. No JVD    Respiratory/Chest: CTA Bilaterally without wheezes or rales. Normal effort    GI/Abdomen: BS +, non-tender, non-distended Ext: no clubbing, cyanosis  Psych: pleasant and cooperative  Skin: intact, chronic changes in LE's  Musculoskeletal: left hand with 1+ edema--k-tape    1+  LE edema B/L  Right knee with crepitus and but no effusion  Neurological:     Mental Status: He is alert.     Comments: Alert and oriented x 3. Normal insight and awareness. Intact Memory. Normal language and speech. Cranial nerve exam unremarkable except for LLQ quadrantanopsia ongoing. LUE 2- /5 pec, biceps and 2- /5 wrist and finger flexion, 1/5 wrist extension.    LLE 2+ to 3-/5 HE, KE and 1+/5 PF, 0-tr/5 DF. RUE and RLE grossly 5/5. Gradual improvement in motor exam       Assessment/Plan: 1. Functional deficits which require 3+ hours per day of interdisciplinary therapy in a comprehensive inpatient  rehab setting. Physiatrist is providing close team supervision and 24 hour management of active medical problems listed below. Physiatrist and rehab team continue to assess barriers to discharge/monitor patient progress toward functional and medical goals  Care Tool:  Bathing    Body parts bathed by patient: Left arm, Chest, Abdomen, Front perineal area, Right upper leg, Left upper leg, Face, Right lower leg, Left lower leg   Body parts bathed by helper: Right arm, Buttocks     Bathing assist Assist Level: Moderate Assistance - Patient 50 - 74% Assistive Device Comment: LH sponge   Upper Body Dressing/Undressing Upper body dressing   What is the patient wearing?: Pull over shirt    Upper body assist Assist Level: Set up assist    Lower Body Dressing/Undressing Lower body dressing      What is the patient wearing?: Underwear/pull up     Lower body assist Assist for lower body dressing: Minimal Assistance - Patient > 75% Assistive Device Comment: reacher   Toileting Toileting Toileting Activity did not occur (Clothing management and hygiene only): N/A (no void or bm)  Toileting assist Assist for toileting: Moderate Assistance - Patient 50 - 74%  Transfers Chair/bed transfer  Transfers assist     Chair/bed transfer assist level: Moderate Assistance - Patient 50 - 74%     Locomotion Ambulation   Ambulation assist   Ambulation activity did not occur: Safety/medical concerns  Assist level: Moderate Assistance - Patient 50 - 74% Assistive device: Other (comment) (R guardrail) Max distance: 25'   Walk 10 feet activity   Assist  Walk 10 feet activity did not occur: Safety/medical concerns  Assist level: Moderate Assistance - Patient - 50 - 74% Assistive device: Other (comment) (R guardrail)   Walk 50 feet activity   Assist Walk 50 feet with 2 turns activity did not occur: Safety/medical concerns         Walk 150 feet activity   Assist Walk 150  feet activity did not occur: Safety/medical concerns         Walk 10 feet on uneven surface  activity   Assist Walk 10 feet on uneven surfaces activity did not occur: Safety/medical concerns         Wheelchair     Assist Is the patient using a wheelchair?: Yes Type of Wheelchair: Manual    Wheelchair assist level: Supervision/Verbal cueing Max wheelchair distance: 100    Wheelchair 50 feet with 2 turns activity    Assist        Assist Level: Supervision/Verbal cueing   Wheelchair 150 feet activity     Assist      Assist Level: Supervision/Verbal cueing   Blood pressure (!) 154/75, pulse 62, temperature 98.2 F (36.8 C), resp. rate 16, height 6' 0.99" (1.854 m), weight 129 kg, SpO2 96 %.    Medical Problem List and Plan: 1. 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 ?embolic. 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             -30 day outpatient cardiac event monitor recommended to r/o a fib              -patient may  shower             -ELOS/Goals: 04/15/21, supervision to min assist goals with PT and OT  -Continue CIR therapies including PT, OT   2.  Antithrombotics: -DVT/anticoagulation:  Mechanical: Sequential compression devices, below knee Bilateral lower extremities Pharmaceutical: Lovenox             -antiplatelet therapy: aspirin 81 mg 3. Pain Management: Tylenol  -OA right knee with associated pain   -continue with voltaren gel 4. Mood: neuropsych has seen him               -antipsychotic agents: n/a 5. Neuropsych: This patient is capable of making decisions on his own behalf. 6. Skin/Wound Care: routine skin checks.  --Monitor BLE ulcerations. 7. Fluids/Electrolytes/Nutrition: eating well  -protein supp for hypoalbuminemia        8. Hypertension: continue Avapro and coreg.    -diet  discussed  coreg increased to 12.5mg  with borderline  control.  1/23 -add hctz for bp control and LE edema 9. 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 Hgb A1c = 11.1             -monitor cbg's, adjust regimen as needed  Carb modified diet.   CBG (last 3)  Recent Labs    04/09/21 1150 04/09/21 1645 04/10/21 0557  GLUCAP 182* 146* 99  1/23 control improving          -Levemir  39u bid         -pt is on farxiga at home--  resume today 10: Vitamin D deficiency: started on 1,000mg  daily supplement.  11: Hypothyroidism: continue Synthroid and recheck labs in 6 weeks 12: Peripheral neuropathy: continue gabapentin 300 mg twice daily at home 13: Herpes zoster of chest: continue Valtrex for total of 2-3 weeks 14: OSA: home BiPAP, 2L O2  at night and naps 15: History of depression: Cymbalta listed on home meds 16: Tobacco use: Offer nicotine patch and provide cessation counseling 17: EtOH use:   Magnesium level reviewed and was 2.2 on 12/21.  Counseling as appropriate 18: Obesity BMI 37.53: Dietary consultation. Provided list of foods that can help with weight loss.  19: Venous insufficiency, bilateral lower extremities: Has had treatment for venous stasis ulcers including Unna boots and follow-up at [facility] wound care center.  Currently with small superficial ulcerations bilateral lower legs.  He routinely wears compression stockings. Legs are stable  1/10 resumed lasix 1/23 need a weight today, edema appears increased  -add HCTZ as above  -may convert to HCTZ alone at discharge 20. 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   -taking meds with food/drink   LOS: 24 days A FACE TO FACE EVALUATION WAS PERFORMED  04/10/2021, 10:11 AM

## 2021-04-11 ENCOUNTER — Encounter: Payer: Self-pay | Admitting: *Deleted

## 2021-04-11 ENCOUNTER — Other Ambulatory Visit (INDEPENDENT_AMBULATORY_CARE_PROVIDER_SITE_OTHER): Payer: BC Managed Care – PPO | Admitting: Student

## 2021-04-11 DIAGNOSIS — I639 Cerebral infarction, unspecified: Secondary | ICD-10-CM

## 2021-04-11 DIAGNOSIS — I6310 Cerebral infarction due to embolism of unspecified precerebral artery: Secondary | ICD-10-CM

## 2021-04-11 LAB — CBC
HCT: 41.3 % (ref 39.0–52.0)
Hemoglobin: 13.8 g/dL (ref 13.0–17.0)
MCH: 31.1 pg (ref 26.0–34.0)
MCHC: 33.4 g/dL (ref 30.0–36.0)
MCV: 93 fL (ref 80.0–100.0)
Platelets: 184 10*3/uL (ref 150–400)
RBC: 4.44 MIL/uL (ref 4.22–5.81)
RDW: 13.7 % (ref 11.5–15.5)
WBC: 6.7 10*3/uL (ref 4.0–10.5)
nRBC: 0 % (ref 0.0–0.2)

## 2021-04-11 LAB — BASIC METABOLIC PANEL
Anion gap: 9 (ref 5–15)
BUN: 10 mg/dL (ref 8–23)
CO2: 31 mmol/L (ref 22–32)
Calcium: 8.8 mg/dL — ABNORMAL LOW (ref 8.9–10.3)
Chloride: 100 mmol/L (ref 98–111)
Creatinine, Ser: 0.83 mg/dL (ref 0.61–1.24)
GFR, Estimated: >60 mL/min (ref >60)
Glucose, Bld: 92 mg/dL (ref 70–99)
Potassium: 3.4 mmol/L — ABNORMAL LOW (ref 3.5–5.1)
Sodium: 140 mmol/L (ref 135–145)

## 2021-04-11 LAB — GLUCOSE, CAPILLARY
Glucose-Capillary: 112 mg/dL — ABNORMAL HIGH (ref 70–99)
Glucose-Capillary: 190 mg/dL — ABNORMAL HIGH (ref 70–99)
Glucose-Capillary: 184 mg/dL — ABNORMAL HIGH (ref 70–99)
Glucose-Capillary: 140 mg/dL — ABNORMAL HIGH (ref 70–99)

## 2021-04-11 MED ORDER — POTASSIUM CHLORIDE CRYS ER 20 MEQ PO TBCR
20.0000 meq | EXTENDED_RELEASE_TABLET | Freq: Once | ORAL | Status: AC
  Administered 2021-04-11: 11:00:00 20 meq via ORAL

## 2021-04-11 NOTE — Progress Notes (Signed)
Physical Therapy Session Note  Patient Details  Name: Joseph Ortega MRN: [id number] Date of Birth: [birth year]  Today's Date: 04/11/2021 PT Individual Time: 0832-0929 PT Individual Time Calculation (min): 57 min   Short Term Goals: Week 4:  PT Short Term Goal 1 (Week 4): STGs = LTGs  Skilled Therapeutic Interventions/Progress Updates:     Pt received seated in [facility] and agrees to therapy. No complaint of pain. PT has discussion with pt regarding potential upcoming discharge and need to achieve level of functional mobility to safely DC home. Pt currently requires fluctuating levels of assistance, from CGA to modA/maxA, for transfers and ambulation, and continues to have frequent LOBs during standing activities. WC transport to gym for time management. Pt is dependent for donning Ted hose and shoes. Much of session focused on block training of squat pivot transfer. Pt transfers from WC<>mat going toward R side, with cues from PT for head-hips relationship. Pt completes x8 total transfers, improving from minA to close supervision. PT cues pt to perform smaller scoot in order to avoid "collapsing" into chair or onto mat. Pt performs x1 sliding board transfer to the R with verbal cues for sequencing. Pt attempts sliding board transfer to the L and is unable to complete without significant physical assistance.   Seated rest break prior to ambulation. Sit to stand requires minA due to inadequate anterior weight shift. Pt ambulates x120' with RW, L hand splint, and minA. Also with +2 WC follow for safety. Pt has x1 LOB to the L and catches himself on parallel bars.   Pt left seated in WC with alarm intact and all needs within reach.  Therapy Documentation Precautions:  Precautions Precautions: Fall Precaution Comments: Lt hemi with Lt sided edema Restrictions Weight Bearing Restrictions: No   Therapy/Group: Individual Therapy  Beau Fanny, PT, DPT 04/11/2021, 2:33 PM

## 2021-04-11 NOTE — Progress Notes (Signed)
30 day monitor

## 2021-04-11 NOTE — Patient Care Conference (Signed)
Inpatient RehabilitationTeam Conference and Plan of Care Update Date: 04/11/2021   Time: 10:30 AM    Patient Name: Joseph Ortega      Medical Record Number: [id number]  Date of Birth: 03/18/1956 Sex: Male         Room/Bed: 4M11C/4M11C-01 Payor Info: Payor: BLUE [employer]UE [employer]IELD / Plan: BCBS COMM PPO / Product Type: *No Product type* /    Admit Date/Time:  03/17/2021  1:48 PM  Primary Diagnosis:  ICH (intracerebral hemorrhage) [facility])  Hospital Problems: Principal Problem:   ICH (intracerebral hemorrhage) [facility])    Expected Discharge Date: Expected Discharge Date: 04/18/21  Team Members Present: Physician leading conference: Dr. Faith Rogue Social Worker Present: Cecile Sheerer, LCSWA Nurse Present: Chana Bode, RN PT Present: Malachi Pro, PT OT Present: Lina Sayre, OT PPS Coordinator present : Fae Pippin, SLP     Current Status/Progress Goal Weekly Team Focus  Bowel/Bladder   Cont. of B/B. LBM: 1/23  Remain Cont.  Frequent toileting Q shift and PRN   Swallow/Nutrition/ Hydration             ADL's   Min A LB ADLs, SUpervision/CGA UB ADLs, can toilet transfer and shower transfer with grab bars consistently mod A, but working towards min A,  has grab bars everywhere at home  Min A  sit<>stands, transfers, anterior weight shift, self-care retraining, dc planning, NMES, L UE NMR   Mobility   supervision bed mobility, minA to modA sit to stand, CGA squat pivot, +2 ambulation up to 190' with RW and L hand splint  Supervision  DC prep, consistency with transfers, balance, family ed   Communication             Safety/Cognition/ Behavioral Observations            Pain   Pt denies any pain  remain pain free  Assess q shift and PRN   Skin   Pt skin is intact  Remain intact  Skin assessment Q shift and PRN     Discharge Planning:  Pt will need to be at supervision level of care at discharge as wife has limited physical abilites (no lifting). Pt will have  PRN support from church family. PT will have HH therapies with [facility] for HHPT/OT/SLP. Pt wife unable to come in for family edu due to transportation issues. Will have family edu via zoom/facetime. Wife has been provided sitter list if chooses to hire addiitonal supports.   Team Discussion: Medications added for BP/edema management and plans to d/c lasix. MD adjusting insulin dosages due to varied CBGs. Nausea is better. Function impaired by poor carry over, weight shift difficulties, loss of balance with ambulation. Patient on target to meet rehab goals: yes, currently needs min assist for lower body care and set up - CGA for upper body care. Needs min - mod assist for toileting and is unsafe with transfers due to loss of balance and weight shifting difficulties. Changed to squat pivot transfers for safety and needs only CGA otherwise is a heavy mod assist for transfers but completes sit - stand with supervision - CGA. Goals for discharge set for min assist overall.  *See Care Plan and progress notes for long and short-term goals.   Revisions to Treatment Plan:  Change to squat pivot transfers   Teaching Needs: Safety, transfers, toileting, medication management, secondary risk management, etc.  Current Barriers to Discharge: Decreased caregiver support and Home enviroment access/layout Wife unable to come in for education  Possible  Resolutions to Barriers: Family HH follow up services DME; RW, BSC and W/C     Medical Summary Current Status: improved nausea with meals, meds. adjusting regimen for diabetes, bp control improving. hctz added to help with edema and bp  Barriers to Discharge: Medical stability   Possible Resolutions to [employer] Focus: adjustment of volume/weight/bp with diuresis, diabetes control. daily assessment of vs/labs   Continued Need for Acute Rehabilitation Level of Care: The patient requires daily medical management by a physician with specialized  training in physical medicine and rehabilitation for the following reasons: Direction of a multidisciplinary physical rehabilitation program to maximize functional independence : Yes Medical management of patient stability for increased activity during participation in an intensive rehabilitation regime.: Yes Analysis of laboratory values and/or radiology reports with any subsequent need for medication adjustment and/or medical intervention. : Yes   I attest that I was present, lead the team conference, and concur with the assessment and plan of the team.   Chana Bode B 04/11/2021, 2:45 PM

## 2021-04-11 NOTE — Progress Notes (Signed)
Patient ID: Joseph Ortega, male   DOB: 09-Feb-1956, 66 y.o.   MRN: [id number]  SW ordered: w/c, RW, and 3in1 BSC with [employer] via parachute.   SW met with pt in room to provide updates from team conference on gains made, and d/c date now 1/31. Pt in agreement with staying longer. Pt informed on DME ordered and vendor will call wife to discuss co-pays.   [id number]-SW called pt wife Joseph Ortega [phone]) and left message informing on above. SW requested return phone call.   SW informed Angela/[facility] on change in pt d/c date.   Loralee Pacas, MSW, [place] Office: [phone] Cell: [phone] Fax: [phone]

## 2021-04-11 NOTE — Progress Notes (Signed)
PROGRESS NOTE   Subjective/Complaints: Pt had a good night. Urinated quite a bit with hctz on board  ROS: Patient denies fever, rash, sore throat, blurred vision, nausea, vomiting, diarrhea, cough, shortness of breath or chest pain, joint or back pain, headache, or mood change.    Objective:   No results found. Recent Labs    04/11/21 0527  WBC 6.7  HGB 13.8  HCT 41.3  PLT 184     Recent Labs    04/11/21 0527  NA 140  K 3.4*  CL 100  CO2 31  GLUCOSE 92  BUN 10  CREATININE 0.83  CALCIUM 8.8*       Intake/Output Summary (Last 24 hours) at 04/11/2021 0901 Last data filed at 04/10/2021 2342 Gross per 24 hour  Intake 480 ml  Output 2975 ml  Net -2495 ml        Physical Exam: Vital Signs Blood pressure (!) 158/79, pulse 63, temperature 98 F (36.7 C), resp. rate 16, height 6' 0.99" (1.854 m), weight 128.8 kg, SpO2 93 %.  Constitutional: No distress . Vital signs reviewed. HEENT: NCAT, EOMI, oral membranes moist Neck: supple Cardiovascular: RRR without murmur. No JVD    Respiratory/Chest: CTA Bilaterally without wheezes or rales. Normal effort    GI/Abdomen: BS +, non-tender, non-distended Ext: no clubbing, cyanosis, or edema Psych: pleasant and cooperative  Skin: intact, chronic changes in LE's  Musculoskeletal: left hand with tr edema--k-tape    tr to 1+  LE edema B/L     Neurological:     Mental Status: He is alert.     Comments: Alert and oriented x 3. Normal insight and awareness. Intact Memory. Normal language and speech. Cranial nerve exam unremarkable except for LLQ quadrantanopsia remains present.  LUE 2- /5 pec, biceps and 2- /5 wrist and finger flexion, 1/5 wrist extension.    LLE 2+ to 3-/5 HE, KE and 1+/5 PF, 0-tr/5 DF. RUE and RLE grossly 5/5.         Assessment/Plan: 1. Functional deficits which require 3+ hours per day of interdisciplinary therapy in a comprehensive inpatient  rehab setting. Physiatrist is providing close team supervision and 24 hour management of active medical problems listed below. Physiatrist and rehab team continue to assess barriers to discharge/monitor patient progress toward functional and medical goals  Care Tool:  Bathing    Body parts bathed by patient: Left arm, Chest, Abdomen, Front perineal area, Right upper leg, Left upper leg, Face, Right lower leg, Left lower leg   Body parts bathed by helper: Right arm, Buttocks     Bathing assist Assist Level: Moderate Assistance - Patient 50 - 74% Assistive Device Comment: LH sponge   Upper Body Dressing/Undressing Upper body dressing   What is the patient wearing?: Pull over shirt    Upper body assist Assist Level: Set up assist    Lower Body Dressing/Undressing Lower body dressing      What is the patient wearing?: Underwear/pull up     Lower body assist Assist for lower body dressing: Minimal Assistance - Patient > 75% Assistive Device Comment: reacher   Toileting Toileting Toileting Activity did not occur Landscape architect and hygiene  only): N/A (no void or bm)  Toileting assist Assist for toileting: Moderate Assistance - Patient 50 - 74%     Transfers Chair/bed transfer  Transfers assist     Chair/bed transfer assist level: Moderate Assistance - Patient 50 - 74%     Locomotion Ambulation   Ambulation assist   Ambulation activity did not occur: Safety/medical concerns  Assist level: Moderate Assistance - Patient 50 - 74% Assistive device: Other (comment) (R guardrail) Max distance: 25'   Walk 10 feet activity   Assist  Walk 10 feet activity did not occur: Safety/medical concerns  Assist level: Moderate Assistance - Patient - 50 - 74% Assistive device: Other (comment) (R guardrail)   Walk 50 feet activity   Assist Walk 50 feet with 2 turns activity did not occur: Safety/medical concerns         Walk 150 feet activity   Assist Walk 150  feet activity did not occur: Safety/medical concerns         Walk 10 feet on uneven surface  activity   Assist Walk 10 feet on uneven surfaces activity did not occur: Safety/medical concerns         Wheelchair     Assist Is the patient using a wheelchair?: Yes Type of Wheelchair: Manual    Wheelchair assist level: Supervision/Verbal cueing Max wheelchair distance: 100    Wheelchair 50 feet with 2 turns activity    Assist        Assist Level: Supervision/Verbal cueing   Wheelchair 150 feet activity     Assist      Assist Level: Supervision/Verbal cueing   Blood pressure (!) 158/79, pulse 63, temperature 98 F (36.7 C), resp. rate 16, height 6' 0.99" (1.854 m), weight 128.8 kg, SpO2 93 %.    Medical Problem List and Plan: 1. 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 ?embolic. 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             -30 day outpatient cardiac event monitor recommended to r/o a fib              -patient may  shower             -ELOS/Goals: 04/15/21, supervision to min assist goals with PT and OT  -Continue CIR therapies including PT and OT. Interdisciplinary team conference today to discuss goals, barriers to discharge, and dc planning.   2.  Antithrombotics: -DVT/anticoagulation:  Mechanical: Sequential compression devices, below knee Bilateral lower extremities Pharmaceutical: Lovenox             -antiplatelet therapy: aspirin 81 mg 3. Pain Management: Tylenol  -OA right knee with associated pain   -continue with voltaren gel 4. Mood: neuropsych has seen him               -antipsychotic agents: n/a 5. Neuropsych: This patient is capable of making decisions on his own behalf. 6. Skin/Wound Care: routine skin checks.  --Monitor BLE ulcerations. 7. Fluids/Electrolytes/Nutrition: eating  well  -protein supp for hypoalbuminemia   1/24 I personally reviewed the patient's labs today.   -K+ 3.4, continue to supplement 8. Hypertension: continue Avapro and coreg.    -diet discussed  coreg increased to 12.5mg  with borderline  control.  1/24 -added hctz for bp control and LE edema 9. 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 Hgb A1c = 11.1             -  monitor cbg's, adjust regimen as needed  Carb modified diet.   CBG (last 3)  Recent Labs    04/10/21 1803 04/10/21 2113 04/11/21 0619  GLUCAP 111* 233* 112*         1/24 control  has been improving although PM numbers are high          -Levemir  39u bid         -resumed his home farxiga 1/23 10: Vitamin D deficiency: started on 1,000mg  daily supplement.  11: Hypothyroidism: continue Synthroid and recheck labs in 6 weeks 12: Peripheral neuropathy: continue gabapentin 300 mg twice daily at home 13: Herpes zoster of chest: continue Valtrex for total of 2-3 weeks 14: OSA: home BiPAP, 2L O2  at night and naps 15: History of depression: Cymbalta listed on home meds 16: Tobacco use: Offer nicotine patch and provide cessation counseling 17: EtOH use:   Magnesium level reviewed and was 2.2 on 12/21.  Counseling as appropriate 18: Obesity BMI 37.53: Dietary consultation. Provided list of foods that can help with weight loss.  19: Venous insufficiency, bilateral lower extremities: Has had treatment for venous stasis ulcers including Unna boots and follow-up at [facility] wound care center.  Currently with small superficial ulcerations bilateral lower legs.  He routinely wears compression stockings. Legs are stable  1/10 resumed lasix 1/24 weight is steady  -added HCTZ 1/23  --1975 cc yestereday  -dc lasix today and use hctz alone 20. 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   -taking meds with food/drink   LOS: 25 days A FACE TO FACE EVALUATION  WAS PERFORMED  Meredith Staggers 04/11/2021, 9:01 AM

## 2021-04-11 NOTE — Progress Notes (Signed)
Patient ID: Joseph Ortega, male   DOB: [birth year], 66 y.o.   MRN: [id number] Patient enrolled for [employer] to ship a 30 day Cardiac event monitor to his address on file. Letter with instructions mailed to patient. Anticipated discharge date 04/15/21.

## 2021-04-11 NOTE — Progress Notes (Signed)
Occupational Therapy Session Note  Patient Details  Name: Joseph Ortega MRN: [id number] Date of Birth: [birth year]  Today's Date: 04/11/2021 OT Individual Time: 0730-0830 OT Individual Time Calculation (min): 60 min   OT Individual Time: 1347-1430  OT Individual Time Calculation (min): 43 min    Short Term Goals: Week 3:  OT Short Term Goal 1 (Week 3): Patient will complete toilet transfer with MOd A OT Short Term Goal 1 - Progress (Week 3): Met OT Short Term Goal 2 (Week 3): Patient will complete 1 step of toileting task OT Short Term Goal 2 - Progress (Week 3): Met OT Short Term Goal 3 (Week 3): Patient will stand at the sink with consitent mod A of 1 in preparation for BADL tasks. OT Short Term Goal 3 - Progress (Week 3): Met Week 4:  OT Short Term Goal 1 (Week 4): LTG=STG 2.2 ELOS  Skilled Therapeutic Interventions/Progress Updates:  Session 1: Patient met lying supine in bed in agreement with OT treatment session. 0/10 pain reported at rest and with activity. Patient reports L resting hand splint and L PRAFO not donned last night. This Probation officer made and posted a sign over patients bed as a reminder for nursing staff. Patient also educated on asking nursing staff to assist him with donning both prior to sleeping at night. Breakfast tray present at bedside untouched. Patient with desire to complete breakfast meal prior to start of session. Supine to EOB with use of bed features and assist to elevate trunk. Cues for attention to L. After completion of breakfast meal seated EOB, patient completed squat-pivot to wc on R with CGA. Assist also provided to keep wc in place. Patient able to self-propel wc to bathroom door in prep for toileting. Education on hand placement for self-propulsion as patient initially reaching out for sink/doors. Attempted to educate patient on simulating toilet transfers at home. Instead of completing squat-pivot with use of grab bars in bathroom, patient stood in front of  commode with request to void bladder in standing. Questionable carryover of education as patient somewhat unsafe. Recommended use of urinal at home for toileting. Patient expressed verbal understanding. UB/LB bathing/dressing at sink level with repeat reminder cues for hemi technique and supervision A. Patient initiating use of LUE at gross assist level without verbal cues indicating improvate attention to L. Increased time/effort to don overhead shirt. LB bathing/dressing with Min A, increased time and use of reacher. Session concluded with patient seated in wc with call bell within reach, chair alarm activated and all needs met.   Session 2: Patient met lying supine in bed in agreement with OT treatment session. 0/10 pain reported at rest and with activity. Supine to EOB without external assist and use of bed features. Patient then completed squat-pivot to wc on R with CGA and assist to maintain position of wheelchair. Wc mobility to ortho gym in prep for therapeutic activity. Patient then participated in wc mobility obstacle course around cones. Some decreased attention to L bumping into door and arm bike on that side. Focus then shifted to blocked practice for squat-pivot transfers in prep for ADLs. Patient able to transfer to non-affected R side with CGA x2 trials. Mod A required for transfers to L with mod cues for placement of LUE and LLE. Total A for wc transport back to room for time management. Session concluded with patient seated in wc with call bell within reach, belt alarm activated and all needs met.   Therapy Documentation Precautions:  Precautions Precautions:  Fall Precaution Comments: Lt hemi with Lt sided edema Restrictions Weight Bearing Restrictions: No General:   Therapy/Group: Individual Therapy  Joseph Ortega 04/11/2021, 7:00 AM

## 2021-04-11 NOTE — Progress Notes (Signed)
Physical Therapy Session Note  Patient Details  Name: Elkin Belfield MRN: [id number] Date of Birth: 17-Aug-1955  Today's Date: 04/11/2021 PT Individual Time: 1615-1700 PT Individual Time Calculation (min): 45 min   Short Term Goals: Week 4:  PT Short Term Goal 1 (Week 4): STGs = LTGs  Skilled Therapeutic Interventions/Progress Updates:    Pt received seated in w/c in room, agreeable to PT session. No complaints of pain. Sit to stand with mod A to RW with cues for anterior weight shift and LE placement. Ambulation x 170 ft with RW with mod A and w/c follow for safety, use of L hand orthosis on RW. Pt continues to exhibit improved LLE clearance during gait with occasional steppage gait pattern and decreased clearance of LE with onset of fatigue. Manual w/c propulsion x 150 ft with use of R UE/LE at Supervision level. Pt left seated in w/c in room with needs in reach, quick release belt and chair alarm in place at end of session.  Therapy Documentation Precautions:  Precautions Precautions: Fall Precaution Comments: Lt hemi with Lt sided edema Restrictions Weight Bearing Restrictions: No       Therapy/Group: Individual Therapy   Peter Congo, PT, DPT, CSRS  04/11/2021, 5:38 PM

## 2021-04-12 LAB — GLUCOSE, CAPILLARY
Glucose-Capillary: 86 mg/dL (ref 70–99)
Glucose-Capillary: 274 mg/dL — ABNORMAL HIGH (ref 70–99)
Glucose-Capillary: 139 mg/dL — ABNORMAL HIGH (ref 70–99)
Glucose-Capillary: 134 mg/dL — ABNORMAL HIGH (ref 70–99)

## 2021-04-12 NOTE — Progress Notes (Signed)
Physical Therapy Session Note  Patient Details  Name: Joseph Ortega MRN: [id number] Date of Birth: [birth year]  Today's Date: 04/12/2021 PT Individual Time: 0802-0859 PT Individual Time Calculation (min): 57 min   Short Term Goals: Week 4:  PT Short Term Goal 1 (Week 4): STGs = LTGs  Skilled Therapeutic Interventions/Progress Updates:     Pt received supine in bed and agrees to therapy. No complaint of pain. PT provides dependent level of assistance to don TED hose while supine. Supine to sit with bed features and cues for body mechanics and core engagement. Pt performs squat pivot transfer to WC to the R with CGA and cues for anterior trunk lean. Pt self propels WC x90' with bilateral lower extremities and cues to increase L knee extension for increased involvement and efficiency of propulsion. Pt performs squat pivot to the R to mat with CGA. Session initially focused on block training of seated scooting to improve body mechanics for improved safety with transfers. Pt scoots R and L a on mat with emphasis on head hips relationship. Pt completes with verbal cues and no physical assistance required. PT provides challenge of scooting 8' to the R and L with as few scoots as possible. After several sets, pt is able to complete with 6 reps going to the R and 5 going to the L. Pt demonstrates good improvement in anterior trunk lean and lateral lean to contralateral direction of scoot.   Pt performs sit to stand with R foot propped on 3 inch platform to encourage increased WB through L hemibody. Pt has bilateral upper extremity support on RW. PT provides modA for sit to stand and CGA to minA for standing balance. In standing, pt utilizes mirror for visual feedback with multimodal cueing for neutral posture. Pt also performs x5 R leg lifts prior to requiring seated rest break.   Squat pivot back to WC to the L with CGA. WC transport back to room. Left seated with alarm intact and all needs within  reach.   Therapy Documentation Precautions:  Precautions Precautions: Fall Precaution Comments: Lt hemi with Lt sided edema Restrictions Weight Bearing Restrictions: No   Therapy/Group: Individual Therapy  Beau Fanny, PT, DPT 04/12/2021, 4:33 PM

## 2021-04-12 NOTE — Progress Notes (Signed)
Occupational Therapy Session Note ° °Patient Details  °Name: Joseph Ortega °MRN: [id number] °Date of Birth: 07/05/1955 ° °Today's Date: 04/12/2021 °OT Individual Time: 1040-1104 °OT Individual Time Calculation (min): 24 min  ° ° °Short Term Goals: °Week 4:  OT Short Term Goal 1 (Week 4): LTG=STG 2.2 ELOS ° °Skilled Therapeutic Interventions/Progress Updates:  °  Pt received sitting up in the w/c reporting fatigue from previous PT session. He completed a sit > stand from the w/c with mod A, extra time to bring pelvis and trunk forward to stand fully upright. Mod facilitation required for LUE management onto orthosis on the RW. Stand pivot transfer back to bed, mod A. From sidelying pt worked on AROM/AAROM LUE elbow flexion/extension, wrist flex/ext, composite hand flex/ext, and shoulder internal/external rotation. He had full range activation in his bicep/tricep and 50% activation of wrist and fingers. He was left supine with all needs met, bed alarm set.  ° °[place] Stim One placed on his L middle and anterior deltoid to promote muscle activation and joint approximation. No adverse skin reaction before/after tx or pain reported. 60 min unattended.  °330 pulse width °35 Hz pulse rate °On 8 sec/ off 8 sec °Ramp up/ down 2 sec °Symmetrical Biphasic wave form  °Max intensity 110mA at 500 Ohm load  ° ° °Therapy Documentation °Precautions:  °Precautions °Precautions: Fall °Precaution Comments: Lt hemi with Lt sided edema °Restrictions °Weight Bearing Restrictions: No ° ° ° °Therapy/Group: Individual Therapy ° °Sandra H Davis °04/12/2021, 7:31 AM °

## 2021-04-12 NOTE — Progress Notes (Signed)
Placed patient on bipap for the night  

## 2021-04-12 NOTE — Progress Notes (Signed)
Patient ID: Joseph Ortega, male   DOB: 1955/08/22, 66 y.o.   MRN: [id number]  SW spoke with pt wife Joseph Ortega [phone]) to inform about copay for DME. SW provided contact information for [employer].   Cecile Sheerer, MSW, LCSWA Office: [phone] Cell: [phone] Fax: [phone]

## 2021-04-12 NOTE — Progress Notes (Signed)
Physical Therapy Session Note  Patient Details  Name: Joseph Ortega MRN: [id number] Date of Birth: [birth year]  Today's Date: 04/12/2021 PT Individual Time: 8315-1761 PT Individual Time Calculation (min): 55 min   Short Term Goals: Week 1:  PT Short Term Goal 1 (Week 1): Pt will perform be mobility with modA. PT Short Term Goal 1 - Progress (Week 1): Met PT Short Term Goal 2 (Week 1): Pt will perform sit to stand with maxA +1. PT Short Term Goal 2 - Progress (Week 1): Met PT Short Term Goal 3 (Week 1): Pt will perform bed to chair transfer with modA +1 consistently. PT Short Term Goal 3 - Progress (Week 1): Progressing toward goal PT Short Term Goal 4 (Week 1): Pt will ambulate x25' with modA +2 and LRAD. PT Short Term Goal 4 - Progress (Week 1): Met Week 2:  PT Short Term Goal 1 (Week 2): Pt will perform bed mobility consistently with minA. PT Short Term Goal 1 - Progress (Week 2): Met PT Short Term Goal 2 (Week 2): Pt will perform sit to stand consistently with minA. PT Short Term Goal 2 - Progress (Week 2): Progressing toward goal PT Short Term Goal 3 (Week 2): Pt will perform bed to chair consistently with minA. PT Short Term Goal 3 - Progress (Week 2): Progressing toward goal PT Short Term Goal 4 (Week 2): Pt will ambulate x50' with modA +1 and LRAD. PT Short Term Goal 4 - Progress (Week 2): Progressing toward goal Week 3:  PT Short Term Goal 1 (Week 3): Pt will consistently perform bed mobility with CGA. PT Short Term Goal 1 - Progress (Week 3): Met PT Short Term Goal 2 (Week 3): Pt will consistently perform sit to stand with minA. PT Short Term Goal 2 - Progress (Week 3): Progressing toward goal PT Short Term Goal 3 (Week 3): Pt will consistently perform bed to chair transfer with minA. PT Short Term Goal 3 - Progress (Week 3): Progressing toward goal PT Short Term Goal 4 (Week 3): Pt will ambulate x50' with modA +1 and LRAD. PT Short Term Goal 4 - Progress (Week 3): Met Week 4:   PT Short Term Goal 1 (Week 4): STGs = LTGs  Skilled Therapeutic Interventions/Progress Updates:    Pt seated in w/c on arrival and agreeable to therapy. Pt reports pain between shoulder blades at rest, but relieves with activity. Pt reports extreme fatigue this PM. Pt transported to therapy gym for time management and energy conservation. Squat pivot >mat table with min A toward R side. Pt participated in [place] toss x 3 bouts with multimodal facilitation for L glute activation without R UE support, LUE support on RW with hand grip. Difficulty with Sit to stand d/t fatigue from low surface, so used elevated surface with min A for Sit to stand. Pt able to self correct and catch balance on RW for mild to mod LOB. Pt then performed CGA squat pivot to L side. Pt transported back to room and performed Stand pivot transfer with bed rail to return to bed with supervision. Supervision sit>supine. Pt then directed in bridging for further glute activation, but had charley horse in R hamstring during second set. Therapist assisted with passive hamstring stretch and pt remained in bed, was left with all needs in reach and alarm active.    Therapy Documentation Precautions:  Precautions Precautions: Fall Precaution Comments: Lt hemi with Lt sided edema Restrictions Weight Bearing Restrictions: No    Therapy/Group: Individual  Therapy  Mickel Fuchs 04/12/2021, 3:23 PM

## 2021-04-12 NOTE — Progress Notes (Signed)
Occupational Therapy Session Note  Patient Details  Name: Joseph Ortega MRN: [id number] Date of Birth: Jul 12, 1955  Today's Date: 04/12/2021 OT Individual Time: 1300-1346 OT Individual Time Calculation (min): 46 min    Short Term Goals: Week 4:  OT Short Term Goal 1 (Week 4): LTG=STG 2.2 ELOS  Skilled Therapeutic Interventions/Progress Updates:  Pt greeted supine in bed  agreeable to OT intervention. Session focus on  functional mobility, dynamic standing balance, functional sit<>stands, DME set- up for DC and decreasing overall caregiver burden. Pt completed supine>sit with CGA, once sitting EOB pt realized his lunch tray had arrived and wanted to finish lunch.  Pt sat EOB to eat lunch while OTA set- up pts w/c and RW ( of note pt likely needs bari RW and BSC however insurance will not cover). Pt completed sit<>stand from EOB with bari RW with MOD A, MOD A for stand pivot to new w/c > L side with MOD A as pt initially took to large of a step with LLE needing MIN A for balance. Pt completed w/c propulsion in new w/c down to gym with supervision. Pt completed x2 sit<>stands from w/c with Rw with pt needing multiple attempts and MOD- MAX A to stand from low surface as pt with issues shifting weight fully forward to power into standing. Pt completed w/c propulsion half way back to room but then reports fatigue needing total A transport back to room d/t fatigue. Pt completed additionally sit<>stand from w/c  with MOD A with RW to place old w/c cushion.  pt left seated in w/c with NT present.                    Therapy Documentation Precautions:  Precautions Precautions: Fall Precaution Comments: Lt hemi with Lt sided edema Restrictions Weight Bearing Restrictions: No  Pain: no pain reported during session     Therapy/Group: Individual Therapy  Precious Haws 04/12/2021, 3:43 PM

## 2021-04-13 LAB — GLUCOSE, CAPILLARY
Glucose-Capillary: 139 mg/dL — ABNORMAL HIGH (ref 70–99)
Glucose-Capillary: 135 mg/dL — ABNORMAL HIGH (ref 70–99)
Glucose-Capillary: 140 mg/dL — ABNORMAL HIGH (ref 70–99)
Glucose-Capillary: 133 mg/dL — ABNORMAL HIGH (ref 70–99)

## 2021-04-13 NOTE — Progress Notes (Signed)
Occupational Therapy Session Note  Patient Details  Name: Joseph Ortega MRN: [id number] Date of Birth: [birth year]  Today's Date: 04/13/2021 OT Individual Time: 1107-1207 OT Individual Time Calculation (min): 60 min   Short Term Goals: Week 4:  OT Short Term Goal 1 (Week 4): LTG=STG 2.2 ELOS  Skilled Therapeutic Interventions/Progress Updates:    Pt greeted semi-reclined in bed and agreeable to OT treatment session. Pt completed bed mobility with supervision, but heavy use of bed rail and HOB elevated. Pt has elevating HOB at home, but no rail. Will work on bed mobility without rail. Pt declined bathing/dressing tasks. Pt needed mod A from OT to don shoes at EOB just slipping on. Practiced sit<>stand at EOB with focus on anterior weight shift and min A! Pt then completed squat-pivot to wc on stronger R side with CGA. Pt brought to therapy laundry room and was able to stand at washer on first trial and CGA. Pt used L UE to steady himself while reaching with R arm to load top loader. L UE secured on arm bike handle using Ace wrap. He completed 8 mins on UE Ergometer with L UE performing ~40%. Practiced stand-pivot to regular BSC as patient did not quality for bariatric. Pt completed stand-pivot using grab bar, increased time to get his feet correct, and min A. Although it was tight, pt was able to fit and did not get stuck in [facility]. OT re-applied kinesiotape to L hand for edema management. Pt returned to room and left seated in wc with call bell in reach and needs met.  OT placed SAEBO e-stim. SAEBO left on for 60 minutes on L shoulder. OT returned to remove SAEBO with skin intact and no adverse reactions.  Saebo Stim One 330 pulse width 35 Hz pulse rate On 8 sec/ off 8 sec Ramp up/ down 2 sec Symmetrical Biphasic wave form  Max intensity 153m at 500 Ohm load   Therapy Documentation Precautions:  Precautions Precautions: Fall Precaution Comments: Lt hemi with Lt sided  edema Restrictions Weight Bearing Restrictions: No Pain: Pain Assessment Pain Scale: 0-10 Pain Score: 0-No pain  Therapy/Group: Individual Therapy  EValma Cava1/26/2023, 11:40 AM

## 2021-04-13 NOTE — Progress Notes (Signed)
PROGRESS NOTE   Subjective/Complaints: No new issues overnight. Asked me if I could write him an extension for his FMLA. In the gym with therapy. Hasnt tried an AFO yet to help stabilize his leg  ROS: Patient denies fever, rash, sore throat, blurred vision, dizziness, nausea, vomiting, diarrhea, cough, shortness of breath or chest pain, joint or back/neck pain, headache, or mood change.    Objective:   No results found. Recent Labs    04/11/21 0527  WBC 6.7  HGB 13.8  HCT 41.3  PLT 184     Recent Labs    04/11/21 0527  NA 140  K 3.4*  CL 100  CO2 31  GLUCOSE 92  BUN 10  CREATININE 0.83  CALCIUM 8.8*       Intake/Output Summary (Last 24 hours) at 04/13/2021 1015 Last data filed at 04/12/2021 2216 Gross per 24 hour  Intake 720 ml  Output 625 ml  Net 95 ml        Physical Exam: Vital Signs Blood pressure 131/68, pulse 65, temperature (!) 97.4 F (36.3 C), temperature source Oral, resp. rate 16, height 6' 0.99" (1.854 m), weight 126.1 kg, SpO2 97 %.  Constitutional: No distress . Vital signs reviewed. obese HEENT: NCAT, EOMI, oral membranes moist Neck: supple Cardiovascular: RRR without murmur. No JVD    Respiratory/Chest: CTA Bilaterally without wheezes or rales. Normal effort    GI/Abdomen: BS +, non-tender, non-distended Ext: no clubbing, cyanosis, or edema Psych: pleasant and cooperative  Skin: intact, chronic changes in LE's  Musculoskeletal: left hand with tr edema--k-tape loosening    tr  LE edema B/L     Neurological:     Mental Status: He is alert.     Comments: Alert and oriented x 3. Normal insight and awareness. Intact Memory. Normal language and speech. Cranial nerve exam unremarkable except for LLQ quadrantanopsia remains present.  LUE 2- /5 pec, biceps and 2+/5 wrist and finger flexion, 1/5 wrist extension.    LLE   3-/5 HE, KE and 2/5 PF, 1-2/5 DF. RUE and RLE grossly 5/5.          Assessment/Plan: 1. Functional deficits which require 3+ hours per day of interdisciplinary therapy in a comprehensive inpatient rehab setting. Physiatrist is providing close team supervision and 24 hour management of active medical problems listed below. Physiatrist and rehab team continue to assess barriers to discharge/monitor patient progress toward functional and medical goals  Care Tool:  Bathing    Body parts bathed by patient: Left arm, Chest, Abdomen, Front perineal area, Right upper leg, Left upper leg, Face, Right lower leg, Left lower leg   Body parts bathed by helper: Right arm, Buttocks     Bathing assist Assist Level: Moderate Assistance - Patient 50 - 74% Assistive Device Comment: LH sponge   Upper Body Dressing/Undressing Upper body dressing   What is the patient wearing?: Pull over shirt    Upper body assist Assist Level: Set up assist    Lower Body Dressing/Undressing Lower body dressing      What is the patient wearing?: Underwear/pull up     Lower body assist Assist for lower body dressing: Minimal Assistance -  Patient > 75% Assistive Device Comment: Radio broadcast assistant Activity did not occur (Probation officer and hygiene only): N/A (no void or bm)  Toileting assist Assist for toileting: Moderate Assistance - Patient 50 - 74%     Transfers Chair/bed transfer  Transfers assist     Chair/bed transfer assist level: Moderate Assistance - Patient 50 - 74%     Locomotion Ambulation   Ambulation assist   Ambulation activity did not occur: Safety/medical concerns  Assist level: Moderate Assistance - Patient 50 - 74% Assistive device: Walker-rolling Max distance: 170'   Walk 10 feet activity   Assist  Walk 10 feet activity did not occur: Safety/medical concerns  Assist level: Moderate Assistance - Patient - 50 - 74% Assistive device: Walker-rolling   Walk 50 feet activity   Assist Walk 50 feet with 2  turns activity did not occur: Safety/medical concerns  Assist level: Moderate Assistance - Patient - 50 - 74% Assistive device: Walker-rolling    Walk 150 feet activity   Assist Walk 150 feet activity did not occur: Safety/medical concerns  Assist level: Moderate Assistance - Patient - 50 - 74% Assistive device: Walker-rolling    Walk 10 feet on uneven surface  activity   Assist Walk 10 feet on uneven surfaces activity did not occur: Safety/medical concerns         Wheelchair     Assist Is the patient using a wheelchair?: Yes Type of Wheelchair: Manual    Wheelchair assist level: Supervision/Verbal cueing Max wheelchair distance: 100    Wheelchair 50 feet with 2 turns activity    Assist        Assist Level: Supervision/Verbal cueing   Wheelchair 150 feet activity     Assist      Assist Level: Supervision/Verbal cueing   Blood pressure 131/68, pulse 65, temperature (!) 97.4 F (36.3 C), temperature source Oral, resp. rate 16, height 6' 0.99" (1.854 m), weight 126.1 kg, SpO2 97 %.    Medical Problem List and Plan: 1. 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 ?embolic. 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             -30 day outpatient cardiac event monitor recommended to r/o a fib              -patient may  shower             -ELOS/Goals: 04/18/21, supervision to min assist goals with PT and OT  -Continue CIR therapies including PT, OT .  PT will try AFO with patient to help stabilize both knee and ankle.    2.  Antithrombotics: -DVT/anticoagulation:  Mechanical: Sequential compression devices, below knee Bilateral lower extremities Pharmaceutical: Lovenox             -antiplatelet therapy: aspirin 81 mg 3. Pain Management: Tylenol  -OA right knee with associated pain   -continue with voltaren  gel 4. Mood: neuropsych has seen him               -antipsychotic agents: n/a 5. Neuropsych: This patient is capable of making decisions on his own behalf. 6. Skin/Wound Care: routine skin checks.  --Monitor BLE ulcerations. 7. Fluids/Electrolytes/Nutrition: eating well  -protein supp for hypoalbuminemia   1/24  -K+ 3.4, continue to supplement 8. Hypertension: continue Avapro and coreg.    -diet discussed  coreg increased to 12.5mg  with borderline  control.   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 Hgb A1c = 11.1             -monitor cbg's, adjust regimen as needed  Carb modified diet.   CBG (last 3)  Recent Labs    04/12/21 1655 04/12/21 2103 04/13/21 0521  GLUCAP 139* 134* 139*               -Levemir  39u bid         -resumed his home farxiga 1/23 with improvement 10: Vitamin D deficiency: started on 1,000mg  daily supplement.  11: Hypothyroidism: continue Synthroid and recheck labs in 6 weeks 12: Peripheral neuropathy: continue gabapentin 300 mg twice daily at home 13: Herpes zoster of chest: continue Valtrex for total of 2-3 weeks 14: OSA: home BiPAP, 2L O2  at night and naps 15: History of depression: Cymbalta listed on home meds 16: Tobacco use: Offer nicotine patch and provide cessation counseling 17: EtOH use:   Magnesium level reviewed and was 2.2 on 12/21.  Counseling as appropriate 18: Obesity BMI 37.53: Dietary consultation. Provided list of foods that can help with weight loss.  19: Venous insufficiency, bilateral lower extremities: Has had treatment for venous stasis ulcers including Unna boots and follow-up at [facility] wound care center.  Currently with small superficial ulcerations bilateral lower legs.  He routinely wears compression stockings. Legs are stable  1/10 resumed lasix 1/26 weight is steady  -added HCTZ 1/23  --425 cc yestereday  -now on hctz alone 20.  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   -taking meds with food/drink   LOS: 27 days A FACE TO FACE EVALUATION WAS PERFORMED  Meredith Staggers 04/13/2021, 10:15 AM

## 2021-04-13 NOTE — Progress Notes (Signed)
Physical Therapy Session Note  Patient Details  Name: Joseph Ortega MRN: [id number] Date of Birth: 09/16/1955  Today's Date: 04/13/2021 PT Individual Time: 0802-0859 and 1505-1530 PT Individual Time Calculation (min): 57 min and 25 min  Short Term Goals: Week 4:  PT Short Term Goal 1 (Week 4): STGs = LTGs  Skilled Therapeutic Interventions/Progress Updates:     Pt received supine in bed and agrees to therapy. No complaint of pain. PT provides dependent level of assist to don TED hose. Supine to sit with bed features and cues for body mechanics and sequencing. Pt performs squat pivot to the L to [facility] with cues for head hip relationship and positioning. WC transport to gym for time management. Pt performs squat pivot transfer to mat, managing WC parts with minA and transfers with cues for initiation. PT places dycem on L handle of pt's personal RW to help to adhere L hand splint. Sit to stand with verbal cues for anterior weight shift, hand placement, and sequencing. Pt ambulates x100' with RW and minA, with cues for upright gaze to improve posture and balance, and increasing proximity to RW for safety. During seated rest break, PT adds L posterior strut AFO to pt's shoe. Pt then performs sit to stand with same cueing and no physical assistance required. Pt ambulates x100' with RW and slightly improved L lower extremity stability and neutral hip rotation. MinA provided for stability due to occasional L sided lean and posterior bias. WC transport back to room. Pt left seated with alarm intact and all needs within reach.  2nd Session: Pt received seated in [facility] and agrees to therapy. No complaint of pain. WC transport to gym for time management. Pt performs stair stepping and toe tapping activity in parallel bars for NMR of L hemibody. Sit to stand with verbal cues on body mechanics. Pt performs x5 steps ups with R foot ascending and L foot stepping back, with bilateral upper extremity support and CGA. Pt then  performs x5 with opposite stepping pattern to increase resistance and work through L leg. Seated rest break. Pt performs again and attempts to only use L hand support, but buckles in L knee with attempt, requiring modA to maintain balance. PT cues to decrease WB through R hand as much as possible to encourage increased L lateral weight shift. Pt then performs 2x10 toe taps with alternating lower extremities. WC transport back to room. Left with all needs within reach.  Therapy Documentation Precautions:  Precautions Precautions: Fall Precaution Comments: Lt hemi with Lt sided edema Restrictions Weight Bearing Restrictions: No   Therapy/Group: Individual Therapy  Beau Fanny, PT, DPT 04/13/2021, 3:48 PM

## 2021-04-13 NOTE — Progress Notes (Signed)
Physical Therapy Session Note  Patient Details  Name: Joseph Ortega MRN: [id number] Date of Birth: [birth year]  Today's Date: 04/13/2021 PT Individual Time: 1304-1400 PT Individual Time Calculation (min): 56 min   Short Term Goals: Week 1:  PT Short Term Goal 1 (Week 1): Pt will perform be mobility with modA. PT Short Term Goal 1 - Progress (Week 1): Met PT Short Term Goal 2 (Week 1): Pt will perform sit to stand with maxA +1. PT Short Term Goal 2 - Progress (Week 1): Met PT Short Term Goal 3 (Week 1): Pt will perform bed to chair transfer with modA +1 consistently. PT Short Term Goal 3 - Progress (Week 1): Progressing toward goal PT Short Term Goal 4 (Week 1): Pt will ambulate x25' with modA +2 and LRAD. PT Short Term Goal 4 - Progress (Week 1): Met Week 2:  PT Short Term Goal 1 (Week 2): Pt will perform bed mobility consistently with minA. PT Short Term Goal 1 - Progress (Week 2): Met PT Short Term Goal 2 (Week 2): Pt will perform sit to stand consistently with minA. PT Short Term Goal 2 - Progress (Week 2): Progressing toward goal PT Short Term Goal 3 (Week 2): Pt will perform bed to chair consistently with minA. PT Short Term Goal 3 - Progress (Week 2): Progressing toward goal PT Short Term Goal 4 (Week 2): Pt will ambulate x50' with modA +1 and LRAD. PT Short Term Goal 4 - Progress (Week 2): Progressing toward goal Week 3:  PT Short Term Goal 1 (Week 3): Pt will consistently perform bed mobility with CGA. PT Short Term Goal 1 - Progress (Week 3): Met PT Short Term Goal 2 (Week 3): Pt will consistently perform sit to stand with minA. PT Short Term Goal 2 - Progress (Week 3): Progressing toward goal PT Short Term Goal 3 (Week 3): Pt will consistently perform bed to chair transfer with minA. PT Short Term Goal 3 - Progress (Week 3): Progressing toward goal PT Short Term Goal 4 (Week 3): Pt will ambulate x50' with modA +1 and LRAD. PT Short Term Goal 4 - Progress (Week 3):  Met  Skilled Therapeutic Interventions/Progress Updates:    PAIN  denies pain Pt initially oob in wc.  Transported to gym Propelled wc w/Les for bipedal warm up activity x 62f L AFO donnted by PT Sit to stand w/mod assist, cues for use of momentum and ant wt shift, repeated mult times thru session.   Gait trials: 1079fw/L AFO, RW w/L handsplint, tendency to ER, decreased hip and knee flexion thru swing, decreased hip extension at terminal stance, trendelenberg and wobbles L knee w/tendency towards mild extension thrust at loading, mild post tendency at times but no LOB, overall min assist.  Standing hip flexion x 10 Standing hs curls x 10 w/cues for breathing/sequencing. Standing on 2in lift w/RLE, L hip extension x 15 w/standing rest after 6.  Seated hip abd w/YTB resistance, RLE stablizid to facilitate isolation of  LLE movement. Requires several min seated rest between standing therex. At end of session, pt transported back to room.  Pt left oob in wc w/alarm belt set and needs in reach   Therapy Documentation Precautions:  Precautions Precautions: Fall Precaution Comments: Lt hemi with Lt sided edema Restrictions Weight Bearing Restrictions: No    Therapy/Group: Individual Therapy BaCallie FieldingPTWarsaw/26/2023, 3:19 PM

## 2021-04-14 LAB — GLUCOSE, CAPILLARY
Glucose-Capillary: 93 mg/dL (ref 70–99)
Glucose-Capillary: 138 mg/dL — ABNORMAL HIGH (ref 70–99)
Glucose-Capillary: 135 mg/dL — ABNORMAL HIGH (ref 70–99)
Glucose-Capillary: 69 mg/dL — ABNORMAL LOW (ref 70–99)
Glucose-Capillary: 84 mg/dL (ref 70–99)

## 2021-04-14 NOTE — Progress Notes (Signed)
Occupational Therapy Session Note  Patient Details  Name: Joseph Ortega MRN: [id number] Date of Birth: [birth year]  Today's Date: 04/14/2021 OT Individual Time: 5638-9373 OT Individual Time Calculation (min): 72 min   Short Term Goals: Week 4:  OT Short Term Goal 1 (Week 4): LTG=STG 2.2 ELOS  Skilled Therapeutic Interventions/Progress Updates:    Pt greeted semi-reclined in bed and agreeable to OT treatment session. Pt stated his foot felt a little numb this morning. Practiced bed mobility without bed rail, pt needed min A to elevate trunk without having bedrail to pull up from. OT educated on hooking LE at EOB, but will continue practicing and problem solving with pt. Sit<>stand at EOB with RW and L hand splint with mod A after multiple trials. Once standing, pt then was able to pivot to the weaker L side with min A, but increased time and min facilitation for weight shifting. Stand-pivot into shower to L side using grab bar and min A. Pt showered from wide drop arm BSC and was able to wash peri-area and buttocks using cut out, wash R UE using adapted strategies learned in previous sessions, and was lower legs using long handled sponge. Overall supervision for bathing with these adaptations. Stand-pivot out of shower to stronger R side with CGA. Dressing tasks completed from wc at the sink with set-up A and min cues for hemi dressing strategies. Pt able to thread B LE's into underwear and pants today, then stood with close supervision. CGA for balance when removing R UE from sink support to pull up pants. OT assist to don TED hose using friction reducing device, then pt able to slip R LE into shoe, but needed OT assist to don L shoe. Pt left seated in wc with call bell in reach and needs met.   OT placed SAEBO e-stim on shoulder. SAEBO left on for 60 minutes. OT returned to remove SAEBO with skin intact and no adverse reactions.  Saebo Stim One 330 pulse width 35 Hz pulse rate On 8 sec/ off 8  sec Ramp up/ down 2 sec Symmetrical Biphasic wave form  Max intensity 138m at 500 Ohm load   Therapy Documentation Precautions:  Precautions Precautions: Fall Precaution Comments: Lt hemi with Lt sided edema Restrictions Weight Bearing Restrictions: No Pain: Pain Assessment Pain Scale: 0-10 Pain Score: 0-No pain   Therapy/Group: Individual Therapy  EValma Cava1/27/2023, 11:00 AM

## 2021-04-14 NOTE — Progress Notes (Signed)
Physical Therapy Session Note  Patient Details  Name: Joseph Ortega MRN: [id number] Date of Birth: [birth year]  Today's Date: 04/14/2021 PT Individual Time: 1401-1459 PT Individual Time Calculation (min): 58 min   Short Term Goals: Week 4:  PT Short Term Goal 1 (Week 4): STGs = LTGs  Skilled Therapeutic Interventions/Progress Updates:     Pt received seated in WC and agrees to therapy. No complaint of pain. WC transport to gym for time management. Pt tasked with standing to RW and ambulating as far as possible without requiring physical assistance from PT. Pt stands from [facility] to RW with verbal cues for body mechanics and maintaining neutral pelvic rotation as pt tends to rotate forward through R pelvis to avoid WB through L side. Pt able to make substantial correction with cueing. Pt ambulates x140' with CGA and occasional verbal cues to maintain upright gaze to improve posture and balance, and increasing proximity to RW for safety. Following extended seated rest break, pt ambulates additional 140' with similar cueing. Cues also provided for safe hand placement with transitional movement of reaching back and sitting in WC.  Stand step transfer to Nustep with verbal cues on sequencing and positioning. Pt completes Nustep for strength and endurance training as well as reciprocal coordination training through L hemibody. PT ace wraps L hand to facilitate grip on handle. Pt completes x15:00 at workload of 5 with 3-4 brief seated rest breaks. Average steps per minute ~40. Stand step transfer to [facility] without physical assistance required. Left seated with alarm intact and all needs within reach.  Therapy Documentation Precautions:  Precautions Precautions: Fall Precaution Comments: Lt hemi with Lt sided edema Restrictions Weight Bearing Restrictions: No    Therapy/Group: Individual Therapy  Beau Fanny, PT, DPT 04/14/2021, 4:01 PM

## 2021-04-14 NOTE — Progress Notes (Signed)
PROGRESS NOTE   Subjective/Complaints: No new issues. Sleeping well. No right knee pain. Left leg still wobbly when he'sup  ROS: Patient denies fever, rash, sore throat, blurred vision, dizziness, nausea, vomiting, diarrhea, cough, shortness of breath or chest pain, joint or back/neck pain, headache, or mood change.   Objective:   No results found. No results for input(s): WBC, HGB, HCT, PLT in the last 72 hours.    No results for input(s): NA, K, CL, CO2, GLUCOSE, BUN, CREATININE, CALCIUM in the last 72 hours.      Intake/Output Summary (Last 24 hours) at 04/14/2021 1012 Last data filed at 04/14/2021 0838 Gross per 24 hour  Intake 960 ml  Output 1450 ml  Net -490 ml        Physical Exam: Vital Signs Blood pressure (!) 142/65, pulse 62, temperature 98.6 F (37 C), resp. rate 19, height 6' 0.99" (1.854 m), weight 126.1 kg, SpO2 97 %.  Constitutional: No distress . Vital signs reviewed. HEENT: NCAT, EOMI, oral membranes moist Neck: supple Cardiovascular: RRR without murmur. No JVD    Respiratory/Chest: CTA Bilaterally without wheezes or rales. Normal effort    GI/Abdomen: BS +, non-tender, non-distended Ext: no clubbing, cyanosis, trr edema Psych: pleasant and cooperative  Skin: intact, chronic changes in LE's  Musculoskeletal: left hand with tr edema--k-tape     tr  LE edema B/L     Neurological:     Mental Status: He is alert.     Comments: Alert and oriented x 3. Normal insight and awareness. Intact Memory. Normal language and speech. Cranial nerve exam unremarkable except for LLQ quadrantanopsia remains present.  LUE 2- /5 pec, biceps and 2+/5 wrist and finger flexion, 1/5 wrist extension.    LLE   3-/5 HE, KE and 2/5 PF, 1-2/5 DF--> motor exam stable. RUE and RLE grossly 5/5.         Assessment/Plan: 1. Functional deficits which require 3+ hours per day of interdisciplinary therapy in a comprehensive  inpatient rehab setting. Physiatrist is providing close team supervision and 24 hour management of active medical problems listed below. Physiatrist and rehab team continue to assess barriers to discharge/monitor patient progress toward functional and medical goals  Care Tool:  Bathing    Body parts bathed by patient: Left arm, Chest, Abdomen, Front perineal area, Right upper leg, Left upper leg, Face, Right lower leg, Left lower leg   Body parts bathed by helper: Right arm, Buttocks     Bathing assist Assist Level: Moderate Assistance - Patient 50 - 74% Assistive Device Comment: LH sponge   Upper Body Dressing/Undressing Upper body dressing   What is the patient wearing?: Pull over shirt    Upper body assist Assist Level: Set up assist    Lower Body Dressing/Undressing Lower body dressing      What is the patient wearing?: Underwear/pull up     Lower body assist Assist for lower body dressing: Minimal Assistance - Patient > 75% Assistive Device Comment: reacher   Toileting Toileting Toileting Activity did not occur (Clothing management and hygiene only): N/A (no void or bm)  Toileting assist Assist for toileting: Moderate Assistance - Patient 50 - 74%  Transfers Chair/bed transfer  Transfers assist     Chair/bed transfer assist level: Moderate Assistance - Patient 50 - 74%     Locomotion Ambulation   Ambulation assist   Ambulation activity did not occur: Safety/medical concerns  Assist level: Moderate Assistance - Patient 50 - 74% Assistive device: Walker-rolling Max distance: 170'   Walk 10 feet activity   Assist  Walk 10 feet activity did not occur: Safety/medical concerns  Assist level: Moderate Assistance - Patient - 50 - 74% Assistive device: Walker-rolling   Walk 50 feet activity   Assist Walk 50 feet with 2 turns activity did not occur: Safety/medical concerns  Assist level: Moderate Assistance - Patient - 50 - 74% Assistive device:  Walker-rolling    Walk 150 feet activity   Assist Walk 150 feet activity did not occur: Safety/medical concerns  Assist level: Moderate Assistance - Patient - 50 - 74% Assistive device: Walker-rolling    Walk 10 feet on uneven surface  activity   Assist Walk 10 feet on uneven surfaces activity did not occur: Safety/medical concerns         Wheelchair     Assist Is the patient using a wheelchair?: Yes Type of Wheelchair: Manual    Wheelchair assist level: Supervision/Verbal cueing Max wheelchair distance: 100    Wheelchair 50 feet with 2 turns activity    Assist        Assist Level: Supervision/Verbal cueing   Wheelchair 150 feet activity     Assist      Assist Level: Supervision/Verbal cueing   Blood pressure (!) 142/65, pulse 62, temperature 98.6 F (37 C), resp. rate 19, height 6' 0.99" (1.854 m), weight 126.1 kg, SpO2 97 %.    Medical Problem List and Plan: 1. 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 ?embolic. 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             -30 day outpatient cardiac event monitor recommended to r/o a fib              -patient may  shower             -ELOS/Goals: 04/18/21, supervision to min assist goals with PT and OT  -Continue CIR therapies including PT, OT, AFO trial.    2.  Antithrombotics: -DVT/anticoagulation:  Mechanical: Sequential compression devices, below knee Bilateral lower extremities Pharmaceutical: Lovenox             -antiplatelet therapy: aspirin 81 mg 3. Pain Management: Tylenol  -OA right knee with associated pain   -continue with voltaren gel 4. Mood: neuropsych has seen him               -antipsychotic agents: n/a 5. Neuropsych: This patient is capable of making decisions on his own behalf. 6. Skin/Wound Care: routine skin checks.  --skin improved 7.  Fluids/Electrolytes/Nutrition: eating well  -protein supp for hypoalbuminemia   K+ 3.4, continue to supplement-->check bmet 1/28 8. Hypertension: continue Avapro and coreg.    -diet discussed  coreg increased to 12.5mg  with borderline  control.   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 Hgb A1c = 11.1             -monitor cbg's, adjust regimen as needed  Carb modified diet.   CBG (last 3)  Recent Labs  04/13/21 1722 04/13/21 2116 04/14/21 0610  GLUCAP 140* 133* 93               -Levemir  39u bid         1/27-resumed his home farxiga 1/23 with improvement 10: Vitamin D deficiency: started on 1,000mg  daily supplement.  11: Hypothyroidism: continue Synthroid and recheck labs in 6 weeks 12: Peripheral neuropathy: continue gabapentin 300 mg twice daily at home 13: Herpes zoster of chest: continue Valtrex for total of 2-3 weeks 14: OSA: home BiPAP, 2L O2  at night and naps 15: History of depression: Cymbalta listed on home meds 16: Tobacco use: Offer nicotine patch and provide cessation counseling 17: EtOH use:   Magnesium level reviewed and was 2.2 on 12/21.  Counseling as appropriate 18: Obesity BMI 37.53: Dietary consultation. Provided list of foods that can help with weight loss.  19: Venous insufficiency, bilateral lower extremities: Has had treatment for venous stasis ulcers including Unna boots and follow-up at [facility] wound care center.  Currently with small superficial ulcerations bilateral lower legs.  He routinely wears compression stockings. Legs are stable  1/27 continue hctz in place of lasix   -bp's better and swelling controlled 20. 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   -taking meds with food/drink   LOS: 28 days A FACE TO FACE EVALUATION WAS PERFORMED  04/14/2021, 10:12 AM

## 2021-04-14 NOTE — Progress Notes (Addendum)
Hypoglycemic Event  CBG: 69  Treatment: 4 oz juice/soda  Symptoms: Hungry  Follow-up CBG: Time:2224 CBG Result:84  Possible Reasons for Event: Inadequate meal intake  Comments/MD notified:Yes per documentation Charge nurse notified, Protocol followed. Pt CBG currently WNL. No distress noted.  Pt also requested cookies and was given oreos  Benay Pillow

## 2021-04-14 NOTE — Progress Notes (Signed)
Physical Therapy Session Note  Patient Details  Name: Joseph Ortega MRN: [id number] Date of Birth: 10-27-1955  Today's Date: 04/14/2021 PT Individual Time: 1100-1155 PT Individual Time Calculation (min): 55 min   Short Term Goals: Week 3:  PT Short Term Goal 1 (Week 3): Pt will consistently perform bed mobility with CGA. PT Short Term Goal 1 - Progress (Week 3): Met PT Short Term Goal 2 (Week 3): Pt will consistently perform sit to stand with minA. PT Short Term Goal 2 - Progress (Week 3): Progressing toward goal PT Short Term Goal 3 (Week 3): Pt will consistently perform bed to chair transfer with minA. PT Short Term Goal 3 - Progress (Week 3): Progressing toward goal PT Short Term Goal 4 (Week 3): Pt will ambulate x50' with modA +1 and LRAD. PT Short Term Goal 4 - Progress (Week 3): Met Week 4:  PT Short Term Goal 1 (Week 4): STGs = LTGs  Skilled Therapeutic Interventions/Progress Updates:     Pt sitting in w/c to start session - agreeable to PT tx and denies pain. Donned L AFO (ottobock walk-on) with totalA for time. Transported him in w/c to day room rehab gym for time management. Focused remainder of session on gait training using LiteGait over treadmill. Donned/doffed harness in standing. Completes sit<>stand to LiteGait with minA for powering to rise. Ace wrapped LUE to reduce distractions and promote weight bearing. MinA for stepping onto treadmill. Completed the following trials:  1) Forward walking, BUE support, 5 minutes, 0.3-0.86mh, 1779f*seated rest  2) Sidestepping L, BUE support, 60m44m9 seconds, 0.1mp50m20ft79fanding rest  3) Sidestepping R, BUE support, 1min24meconds, 0.1mph, 75mt  -37fh forward walking, requiring TC for lateral weightshift to assist with L foot clearance. Onset of genu recurvatum after ~3 minutes due to fatigue with pt aware. VC for increasing stance time on L and lengthening strides. -With side stepping L, his L foot remained exernally rotated (pt  reports baseline), resulting in compensation for hip external rotators for side stepping motion. Used medium resistance bungee to assist with tactile feedback for initiation -with side stepping R, pt struggled most with stance control on L. He had x1 onset of quick R lower low back pain that resolved with standing rest break. Also anticipate that fatigue impacted his ability to side step R. Decreased postural awareness and foot placement while sidestepping, trunk and pelvic rotation to limit and compensate for weak hip abductors.   Pt required minA for backward stepping off of treadmill and then he was transported back to his room in w/c for time. Remained seated in w/c with all needs met, pt made comfortable.   Therapy Documentation Precautions:  Precautions Precautions: Fall Precaution Comments: Lt hemi with Lt sided edema Restrictions Weight Bearing Restrictions: No General:    Therapy/Group: Individual Therapy  ChristiaAlger Simons23, 7:51 AM

## 2021-04-15 LAB — GLUCOSE, CAPILLARY
Glucose-Capillary: 125 mg/dL — ABNORMAL HIGH (ref 70–99)
Glucose-Capillary: 131 mg/dL — ABNORMAL HIGH (ref 70–99)
Glucose-Capillary: 118 mg/dL — ABNORMAL HIGH (ref 70–99)
Glucose-Capillary: 355 mg/dL — ABNORMAL HIGH (ref 70–99)
Glucose-Capillary: 127 mg/dL — ABNORMAL HIGH (ref 70–99)

## 2021-04-15 MED ORDER — POTASSIUM CHLORIDE CRYS ER 20 MEQ PO TBCR
20.0000 meq | EXTENDED_RELEASE_TABLET | Freq: Two times a day (BID) | ORAL | Status: DC
  Administered 2021-04-15 – 2021-04-18 (×6): 20 meq via ORAL
  Filled 2021-04-15 (×6): qty 1

## 2021-04-15 NOTE — Progress Notes (Signed)
PROGRESS NOTE   Subjective/Complaints: Pt doing well. No new complaints. Feels stronger and more prepared to go home  ROS: Patient denies fever, rash, sore throat, blurred vision, dizziness, nausea, vomiting, diarrhea, cough, shortness of breath or chest pain, joint or back/neck pain, headache, or mood change.   Objective:   No results found. No results for input(s): WBC, HGB, HCT, PLT in the last 72 hours.    No results for input(s): NA, K, CL, CO2, GLUCOSE, BUN, CREATININE, CALCIUM in the last 72 hours.      Intake/Output Summary (Last 24 hours) at 04/15/2021 1114 Last data filed at 04/15/2021 0728 Gross per 24 hour  Intake 814 ml  Output 1200 ml  Net -386 ml        Physical Exam: Vital Signs Blood pressure (!) 143/72, pulse 71, temperature 98 F (36.7 C), resp. rate 17, height 6' 0.99" (1.854 m), weight 126.1 kg, SpO2 95 %.  Constitutional: No distress . Vital signs reviewed. HEENT: NCAT, EOMI, oral membranes moist Neck: supple Cardiovascular: RRR without murmur. No JVD    Respiratory/Chest: CTA Bilaterally without wheezes or rales. Normal effort    GI/Abdomen: BS +, non-tender, non-distended Ext: no clubbing, cyanosis, or edema Psych: pleasant and cooperative  Skin: intact, chronic changes in LE's Musculoskeletal: left hand with tr edema--k-tape     tr  LE edema B/L     Neurological:     Mental Status: He is alert.     Comments: Alert and oriented x 3. Normal insight and awareness. Intact Memory. Normal language and speech. Cranial nerve exam unremarkable except for LLQ quadrantanopsia remains present.  LUE 2- /5 pec, biceps and 2+/5 wrist and finger flexion, 1/5 wrist extension.    LLE   3-/5 HE, KE and 2/5 PF, 1-2/5 DF--> motor exam stable. RUE and RLE grossly 5/5.         Assessment/Plan: 1. Functional deficits which require 3+ hours per day of interdisciplinary therapy in a comprehensive inpatient  rehab setting. Physiatrist is providing close team supervision and 24 hour management of active medical problems listed below. Physiatrist and rehab team continue to assess barriers to discharge/monitor patient progress toward functional and medical goals  Care Tool:  Bathing    Body parts bathed by patient: Left arm, Chest, Abdomen, Front perineal area, Right upper leg, Left upper leg, Face, Right lower leg, Left lower leg, Buttocks, Right arm   Body parts bathed by helper: Right arm, Buttocks     Bathing assist Assist Level: Supervision/Verbal cueing Assistive Device Comment: LH sponge, BSC cut out in shower   Upper Body Dressing/Undressing Upper body dressing   What is the patient wearing?: Pull over shirt    Upper body assist Assist Level: Set up assist    Lower Body Dressing/Undressing Lower body dressing      What is the patient wearing?: Underwear/pull up     Lower body assist Assist for lower body dressing: Minimal Assistance - Patient > 75% Assistive Device Comment: reacher   Toileting Toileting Toileting Activity did not occur (Clothing management and hygiene only): N/A (no void or bm)  Toileting assist Assist for toileting: Minimal Assistance - Patient > 75%  Transfers Chair/bed transfer  Transfers assist     Chair/bed transfer assist level: Minimal Assistance - Patient > 75%     Locomotion Ambulation   Ambulation assist   Ambulation activity did not occur: Safety/medical concerns  Assist level: Moderate Assistance - Patient 50 - 74% Assistive device: Walker-rolling Max distance: 170'   Walk 10 feet activity   Assist  Walk 10 feet activity did not occur: Safety/medical concerns  Assist level: Moderate Assistance - Patient - 50 - 74% Assistive device: Walker-rolling   Walk 50 feet activity   Assist Walk 50 feet with 2 turns activity did not occur: Safety/medical concerns  Assist level: Moderate Assistance - Patient - 50 -  74% Assistive device: Walker-rolling    Walk 150 feet activity   Assist Walk 150 feet activity did not occur: Safety/medical concerns  Assist level: Moderate Assistance - Patient - 50 - 74% Assistive device: Walker-rolling    Walk 10 feet on uneven surface  activity   Assist Walk 10 feet on uneven surfaces activity did not occur: Safety/medical concerns         Wheelchair     Assist Is the patient using a wheelchair?: Yes Type of Wheelchair: Manual    Wheelchair assist level: Supervision/Verbal cueing Max wheelchair distance: 100    Wheelchair 50 feet with 2 turns activity    Assist        Assist Level: Supervision/Verbal cueing   Wheelchair 150 feet activity     Assist      Assist Level: Supervision/Verbal cueing   Blood pressure (!) 143/72, pulse 71, temperature 98 F (36.7 C), resp. rate 17, height 6' 0.99" (1.854 m), weight 126.1 kg, SpO2 95 %.    Medical Problem List and Plan: 1. 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 ?embolic. 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             -30 day outpatient cardiac event monitor recommended to r/o a fib              -patient may  shower             -ELOS/Goals: 04/18/21, supervision to min assist goals with PT and OT  -Continue CIR therapies including PT, OT AFO trial.    2.  Antithrombotics: -DVT/anticoagulation:  Mechanical: Sequential compression devices, below knee Bilateral lower extremities Pharmaceutical: Lovenox             -antiplatelet therapy: aspirin 81 mg 3. Pain Management: Tylenol  -OA right knee with associated pain   -continue with voltaren gel 4. Mood: neuropsych has seen him               -antipsychotic agents: n/a 5. Neuropsych: This patient is capable of making decisions on his own behalf. 6. Skin/Wound Care: routine skin checks.   --skin improved 7. Fluids/Electrolytes/Nutrition: eating well  -protein supp for hypoalbuminemia   K+ 3.4 still on 1/28, labs reviewed--increase to bid  8. Hypertension: continue Avapro and coreg.    -diet discussed  coreg increased to 12.5mg  with borderline  control.   -added hctz for bp control and LE edema --improved 9. 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 Hgb A1c = 11.1             -monitor cbg's, adjust regimen as needed  Carb modified diet.   CBG (last 3)  Recent Labs  04/14/21 2150 04/14/21 2216 04/15/21 0601  GLUCAP 69* 84 125*               -Levemir  39u bid         1/28-resumed his home farxiga 1/23 with improvement 10: Vitamin D deficiency: started on 1,000mg  daily supplement.  11: Hypothyroidism: continue Synthroid and recheck labs in 6 weeks 12: Peripheral neuropathy: continue gabapentin 300 mg twice daily at home 13: Herpes zoster of chest: continue Valtrex for total of 2-3 weeks 14: OSA: home BiPAP, 2L O2  at night and naps 15: History of depression: Cymbalta listed on home meds 16: Tobacco use: Offer nicotine patch and provide cessation counseling 17: EtOH use:   Magnesium level reviewed and was 2.2 on 12/21.  Counseling as appropriate 18: Obesity BMI 37.53: Dietary consultation. Provided list of foods that can help with weight loss.  19: Venous insufficiency, bilateral lower extremities: Has had treatment for venous stasis ulcers including Unna boots and [facility]  Currently with small superficial ulcerations bilateral lower legs.  He routinely wears compression stockings. Legs are stable  1/28 continue hctz in place of lasix   -bp's better and swelling controlled 20. 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   -taking meds with food/drink   LOS: 29 days A FACE TO FACE EVALUATION WAS PERFORMED  Ranelle OysterZachary T Sheily Lineman 04/15/2021, 11:14 AM

## 2021-04-15 NOTE — Progress Notes (Signed)
Physical Therapy Discharge Summary  Patient Details  Name: Joseph Ortega MRN: [id number] Date of Birth: Aug 28, 1955   Patient has met 6 of 8 long term goals due to improved activity tolerance, improved balance, improved postural control, increased strength, and improved coordination.  Patient to discharge at a wheelchair level Supervision.   Patient's wife was unable to attend family education but pt is able to consistently perform transfers without physical assistance and has been educated on not ambulating without trained therapy staff.  Reasons goals not met: Pt requires CGA for car transfer for safety and still requiring at least minA for stair training due to balance deficits and L sided hemplegia and inattention.  Recommendation:  Patient will benefit from ongoing skilled PT services in home health setting to continue to advance safe functional mobility, address ongoing impairments in strength, balance, ambulaiton, and minimize fall risk.  Equipment: 22" x 18" manual WC, RW  Reasons for discharge: discharge from hospital  Patient/family agrees with progress made and goals achieved: Yes  PT Discharge Precautions/Restrictions Precautions Precautions: Fall Restrictions Weight Bearing Restrictions: No Pain Interference Pain Interference Pain Effect on Sleep: 1. Rarely or not at all Pain Interference with Therapy Activities: 1. Rarely or not at all Pain Interference with Day-to-Day Activities: 1. Rarely or not at all Vision/Perception  Vision - History Ability to See in Adequate Light: 0 Adequate Vision - Assessment Eye Alignment: Impaired (comment) (R esotropia) Ocular Range of Motion: Within Functional Limits Alignment/Gaze Preference: Within Defined Limits Tracking/Visual Pursuits: Decreased smoothness of eye movement to RIGHT superior field;Decreased smoothness of eye movement to LEFT superior field;Decreased smoothness of vertical tracking Saccades: Additional eye shifts  occurred during testing;Decreased speed of saccadic movement;Undershoots Convergence: Impaired (comment) (>5cm) Perception Perception: Impaired Inattention/Neglect: Does not attend to left side of body (mild) Praxis Praxis: Intact  Cognition Overall Cognitive Status: Within Functional Limits for tasks assessed Arousal/Alertness: Awake/alert Orientation Level: Oriented X4 Safety/Judgment: Appears intact Sensation Sensation Light Touch: Appears Intact Motor  Motor Motor: Hemiplegia Motor - Skilled Clinical Observations: Improved from eval  Mobility Bed Mobility Bed Mobility: Supine to Sit;Sit to Supine Supine to Sit: Supervision/Verbal cueing Sit to Supine: Supervision/Verbal cueing Transfers Transfers: Sit to Stand;Stand to Sit;Squat Pivot Transfers;Stand Pivot Transfers Sit to Stand: Supervision/Verbal cueing Stand to Sit: Supervision/Verbal cueing Stand Pivot Transfers: Supervision/Verbal cueing Stand Pivot Transfer Details: Verbal cues for technique;Verbal cues for safe use of DME/AE;Verbal cues for sequencing Squat Pivot Transfers: Supervision/Verbal cueing Locomotion  Gait Ambulation: Yes Gait Assistance: Supervision/Verbal cueing Gait Distance (Feet): 80 Feet Assistive device: Rolling walker Gait Assistance Details: Verbal cues for safe use of DME/AE;Verbal cues for technique;Verbal cues for sequencing;Verbal cues for gait pattern Gait Gait: Yes Gait Pattern: Impaired Gait Pattern:  (L hemi gait with slightly externally rotated L hip) Wheelchair Mobility Wheelchair Mobility: Yes Wheelchair Assistance: Supervision/Verbal cueing Wheelchair Propulsion: Both lower extermities;Right upper extremity Wheelchair Parts Management: Supervision/cueing Distance: 150'  Trunk/Postural Assessment  Cervical Assessment Cervical Assessment:  (forward head) Thoracic Assessment Thoracic Assessment:  (rounded shoulders) Lumbar Assessment Lumbar Assessment:  (posterior pelvic  tilt) Postural Control Postural Control: Deficits on evaluation (delayed, but improved from eval)  Balance Balance Balance Assessed: Yes Static Sitting Balance Static Sitting - Balance Support: Feet supported Static Sitting - Level of Assistance: 5: Stand by assistance Dynamic Sitting Balance Dynamic Sitting - Balance Support: Feet supported Dynamic Sitting - Level of Assistance: 5: Stand by assistance Static Standing Balance Static Standing - Balance Support: During functional activity;Bilateral upper extremity supported Static Standing - Level  of Assistance: 5: Stand by assistance Dynamic Standing Balance Dynamic Standing - Balance Support: During functional activity Dynamic Standing - Level of Assistance: 5: Stand by assistance Extremity Assessment  RLE Assessment RLE Assessment: Within Functional Limits General Strength Comments: Grossly 5/5 throughout in sitting LLE Assessment LLE Assessment: Exceptions to [facility] LLE Strength LLE Overall Strength: Deficits Left Hip Flexion: 3/5 Left Hip ABduction: 2-/5 Left Hip ADduction: 3-/5 Left Knee Flexion: 3+/5 Left Knee Extension: 3+/5 Left Ankle Dorsiflexion: 2/5 Left Ankle Plantar Flexion: 3+/5    Breck Coons, PT, DPT Apolinar Junes, PT, DPT 04/17/2021, 4:41 PM

## 2021-04-15 NOTE — Progress Notes (Signed)
Occupational Therapy Discharge Summary  Patient Details  Name: Joseph Ortega MRN: [id number] Date of Birth: Aug 02, 1955   Patient has met 8 of 8 long term goals due to improved activity tolerance, improved balance, postural control, ability to compensate for deficits, functional use of  LEFT upper and LEFT lower extremity, improved attention, improved awareness, and improved coordination.  Patient to discharge at overall Supervision/CGA level.  Patient's care partner is independent to provide the necessary supervision/ intermittent CGA for toileting tasks, toilet transfers, and shower transfers.  Reasons goals not met: n/a  Recommendation:  Patient will benefit from ongoing skilled OT services in home health setting to continue to advance functional skills in the area of BADL.  Equipment: Wide drop arm BSC, RW, wheelchair  Reasons for discharge: treatment goals met and discharge from hospital  Patient/family agrees with progress made and goals achieved: Yes  OT Discharge Precautions/Restrictions  Precautions Precautions: Fall Precaution Comments: Lt hemiplegia Restrictions Weight Bearing Restrictions: No Pain  Denies pain ADL ADL Eating: Set up Grooming: Setup Where Assessed-Grooming: Edge of bed Upper Body Bathing: Setup Where Assessed-Upper Body Bathing: Edge of bed Lower Body Bathing: Contact guard Where Assessed-Lower Body Bathing: Edge of bed Upper Body Dressing: Setup Where Assessed-Upper Body Dressing: Edge of bed Lower Body Dressing: Contact guard Where Assessed-Lower Body Dressing: Edge of bed Toileting: Minimal assistance Toilet Transfer: Minimal assistance Tub/Shower Transfer: Not assessed Perception  Perception: Impaired Inattention/Neglect:  (Continu3w to have mild L inattention, but improved since eval) Praxis Praxis: Intact Cognition Overall Cognitive Status: Within Functional Limits for tasks assessed Arousal/Alertness: Awake/alert Orientation Level:  Oriented X4 Year: 2022 Month: January Day of Week: Correct Attention: Divided Divided Attention: Appears intact Immediate Memory Recall: Blue;Sock;Bed Memory Recall Sock: Without Cue Memory Recall Blue: Without Cue Memory Recall Bed: Without Cue Awareness: Impaired Problem Solving: Impaired Safety/Judgment: Appears intact Sensation Sensation Light Touch: Appears Intact Coordination Gross Motor Movements are Fluid and Coordinated: No Fine Motor Movements are Fluid and Coordinated: No Coordination and Movement Description: Affected by Lt hemiplegia and Lt sided edema Finger Nose Finger Test: WNL Rt, unable to complete on the Lt Motor  Motor Motor: Hemiplegia Motor - Skilled Clinical Observations: Improved from eval Mobility  Bed Mobility Bed Mobility: Supine to Sit;Sit to Supine Supine to Sit: Supervision/Verbal cueing Sit to Supine: Supervision/Verbal cueing Transfers Sit to Stand: Supervision/Verbal cueing Stand to Sit: Supervision/Verbal cueing  Trunk/Postural Assessment  Cervical Assessment Cervical Assessment:  (forward head) Thoracic Assessment Thoracic Assessment:  (rounded shoulders) Lumbar Assessment Lumbar Assessment:  (posterior pelvic tilt) Postural Control Postural Control:  (delayed, but improved from eval)  Balance Balance Balance Assessed: Yes Static Sitting Balance Static Sitting - Balance Support: Feet supported Static Sitting - Level of Assistance: 6: Modified independent (Device/Increase time) Dynamic Sitting Balance Dynamic Sitting - Balance Support: Feet supported Dynamic Sitting - Level of Assistance: 5: Stand by assistance Static Standing Balance Static Standing - Balance Support: During functional activity;Bilateral upper extremity supported Static Standing - Level of Assistance: 5: Stand by assistance Dynamic Standing Balance Dynamic Standing - Balance Support: During functional activity Dynamic Standing - Level of Assistance: 5: Stand  by assistance (intermittent CGA) Extremity/Trunk Assessment RUE Assessment RUE Assessment: Within Functional Limits Active Range of Motion (AROM) Comments: WNL LUE Assessment LUE Assessment: Exceptions to [facility] LUE Body System: Neuro Brunstrum levels for arm and hand: Arm;Hand Brunstrum level for arm: Stage III Synergy is performed voluntarily Brunstrum level for hand: Stage III Synergies performed voluntarily   Valma Cava 04/17/2021, 10:08 PM

## 2021-04-15 NOTE — Progress Notes (Signed)
Physical Therapy Session Note  Patient Details  Name: Joseph Ortega MRN: [id number] Date of Birth: 1955/07/01  Today's Date: 04/15/2021 PT Individual Time: 1105-1201 PT Individual Time Calculation (min): 56 min   Short Term Goals: Week 4:  PT Short Term Goal 1 (Week 4): STGs = LTGs  Skilled Therapeutic Interventions/Progress Updates:     Pt received seated in WC and agrees to therapy. Reports some soreness in L shoulder. Number not provided. PT provides rest breaks as needed to manage pain. WC transport to gym for time management. Pt performs car transfers with RW, L hand splint and CGA. PT provides cues for sequencing and posture to improve COG as pt tends to have posterior bias. Following seated rest break, pt performs sit to stand with RW and cues for hand placement and body mechanics. Pt completes ramp navigation with RW and CGA, with cues for upright gaze to improve posture and balance, after completing ramp, pt has complete LOB backward requiring maxA to prevent fall.   Pt self propels WC x175' with bilateral lower extremities and R upper extremity, with cues for navigation and efficient propulsion. Pt goes forward for first 100' and backward for 75', and verbalizes improved ease relative to going forward.   Pt performs sit to stand to RW with verbal cues for hand placement and body mechanics. Pt ambulates x80' with very close supervision and cues for posture, L lower extremity engagement, and sequencing and RW management when turning to sit in WC. Left seated in WC with alarm intact and all needs within reach.  Therapy Documentation Precautions:  Precautions Precautions: Fall Precaution Comments: Lt hemi with Lt sided edema Restrictions Weight Bearing Restrictions: No    Therapy/Group: Individual Therapy  Beau Fanny, PT, DPT 04/15/2021, 3:50 PM

## 2021-04-15 NOTE — Progress Notes (Signed)
Occupational Therapy Session Note  Patient Details  Name: Joseph Ortega MRN: [id number] Date of Birth: March 05, 1956  Today's Date: 04/15/2021 OT Individual Time: 2694-8546 OT Individual Time Calculation (min): 73 min    Short Term Goals: Week 4:  OT Short Term Goal 1 (Week 4): LTG=STG 2.2 ELOS  Skilled Therapeutic Interventions/Progress Updates:    Pt greeted semi-reclined in bed and agreeable to OT treatment session. Worked on bed mobility to simulate home environment without bed rails. Practiced rolling onto side, then hooking legs to push up. Pt with difficulty rolling to R side, but could roll L with supervision, He then was almost able to push into sitting with HOB raised, but needed min A to completed elevate trunk. Pt reported need to have a BM. CGA squat-pivot to wc on R side. Pt completed stand-pivot to [facility] over toilet with grab bars and min A. Min A for clothing management. Pt with successful BM. Worked on peri-care with pt able to come to standing with RW and CGA. He then needed CGA for balance, but was able to wash peri-area in standing using RW for support. Min A to pivot back to wc using RW. Dressing tasks completed sit<>stand from wc at the sink using reacher and overall close supervision, but CGA for standing balance when pulling up pants. OT had pt video record OT donning TED hose using fruction reducing device to share with wife. OT had pt talk OT through self-care and transfers on how he would educate wife to provide CGA/min A. Pt left seated in wc at end of session with needs met.   Therapy Documentation Precautions:  Precautions Precautions: Fall Precaution Comments: Lt hemi with Lt sided edema Restrictions Weight Bearing Restrictions: No Pain: Pain Assessment Pain Scale: 0-10 Pain Score: 0-No pain _0  Therapy/Group: Individual Therapy  Valma Cava 04/15/2021, 10:56 AM

## 2021-04-15 NOTE — Progress Notes (Incomplete)
Inpatient Rehabilitation Discharge Medication Review by a Pharmacist  A complete drug regimen review was completed for this patient to identify any potential clinically significant medication issues.  High Risk Drug Classes Is patient taking? Indication by Medication  Antipsychotic No   Anticoagulant No   Antibiotic No   Opioid No   Antiplatelet Yes Aspirin- CVA prophylaxis  Hypoglycemics/insulin Yes iSS, levemir, farxiga- T2DM  Vasoactive Medication Yes Cardura, Avapro, coreg, hydrochlorothiazide- BPH, hypertension  Chemotherapy No   Other Yes Lipitor- HLD Remeron- MDD Cymbalta- MDD Synthroid- hypothyroidism     Type of Medication Issue Identified Description of Issue Recommendation(s)  Drug Interaction(s) (clinically significant)     Duplicate Therapy     Allergy     No Medication Administration End Date     Incorrect Dose     Additional Drug Therapy Needed     Significant med changes from prior encounter (inform family/care partners about these prior to discharge).    Other  PTA meds: Flonase Gabapentin Trulicity Ergocalciferol Restart PTA meds if and when clinically necessary during CIR admission or at discharge    Clinically significant medication issues were identified that warrant physician communication and completion of prescribed/recommended actions by midnight of the next day:  No   Time spent performing this drug regimen review (minutes):  30    Symia Herdt BS, PharmD, BCPS Clinical Pharmacist 04/15/2021 7:26 AM

## 2021-04-16 LAB — GLUCOSE, CAPILLARY
Glucose-Capillary: 118 mg/dL — ABNORMAL HIGH (ref 70–99)
Glucose-Capillary: 136 mg/dL — ABNORMAL HIGH (ref 70–99)
Glucose-Capillary: 140 mg/dL — ABNORMAL HIGH (ref 70–99)
Glucose-Capillary: 105 mg/dL — ABNORMAL HIGH (ref 70–99)

## 2021-04-16 NOTE — Progress Notes (Signed)
RT note. BIPAP QHS, not needed at this time. RT will continue to monitor.

## 2021-04-16 NOTE — Progress Notes (Signed)
Occupational Therapy Session Note  Patient Details  Name: Joseph Ortega MRN: [id number] Date of Birth: 08-08-1955  Today's Date: 04/17/2021 OT Individual Time: [phone] and 1300-1357 OT Individual Time Calculation (min): 58 min and 57 min  Skilled Therapeutic Interventions/Progress Updates:    Pt greeted in bed with no c/o pain, agreeable to session, requesting to engage in BADLs today excluding pericare and changing pants. Pt transitioned EOB with setup assistance using the bedrail with HOB elevated. Setup for UB self care pt using hemi techniques well to don overhead shirt. Setup for oral are and grooming tasks, pt using one handed techniques as appropriate. He was very motivated to Medical illustrator socks with supervision for balance only, however with increased time and adaptive methods, pt unable to meet task demands on his own. Mod A to don gripper socks, Total A for Teds. Sit<stand from elevated bed completed with CGA using RW for pt to "simulate" washing periarea. Pt with standing endurance <30 seconds. He wanted to return to bed at close of session, left with all needs within reach, hemiplegic side supported, and bed alarm set. Tx focus placed on NMR, sit<stand, activity tolerance, and adaptive self care skills.  2nd Session 1:1 tx (57 min) Pt greeted in bed with Lt knee pain manageable for tx with rest as needed. Setup for supine<sit with HOB elevated pt using the bedrail. CGA for stand pivot<w/c using RW. Pt was then escorted to the dayroom. Focus of session placed on Lt NMR to increase functional use of the Lt side during self care tasks and functional transfers. Pt engaged in standing activities such as tabletop push ups, Lt>Rt weight shifts, and forward/backward stepping. Pt requiring CGA for balance assistance with B UE support on elevated table, cues for increasing Lt UE weightbearing at times. Pt required seated rest due to fatigue. CGA and multiple attempts for sit<stands due to fatigue and  low transfer surface. While seated, worked on [employer] via [employer]. Pt with tightness in elbow extensors and horizontal abductors, pt enjoyed stretching. At end of session pt was returned to the room and wanted to remain sitting up for next therapist. Pt left with all needs within reach and safety belt fastened.   Saebo E-stim applied to anterior+posterior deltoids for ~30 minutes during WB activity. Pt tolerated well, skin intact once pads were removed. E-stim parameters stated below: Saebo Stim One 330 pulse width 35 Hz pulse rate On 8 sec/ off 8 sec Ramp up/ down 2 sec Symmetrical Biphasic wave form  Max intensity at 500 Ohm load   Therapy Documentation Precautions:  Precautions Precautions: Fall Precaution Comments: Lt hemi with Lt sided edema Restrictions Weight Bearing Restrictions: No ADL: ADL Eating: Set up Grooming: Setup Where Assessed-Grooming: Edge of bed Upper Body Bathing: Setup Where Assessed-Upper Body Bathing: Edge of bed Lower Body Bathing: Contact guard Where Assessed-Lower Body Bathing: Edge of bed Upper Body Dressing: Setup Where Assessed-Upper Body Dressing: Edge of bed Lower Body Dressing: Dependent Where Assessed-Lower Body Dressing: Edge of bed Toileting: Not assessed Toilet Transfer: Not assessed Tub/Shower Transfer: Not assessed  Therapy/Group: Individual Therapy  Jovian Lembcke A Chirsty Armistead 04/17/2021, 12:08 PM

## 2021-04-16 NOTE — Progress Notes (Signed)
Physical Therapy Session Note  Patient Details  Name: Joseph Ortega MRN: [id number] Date of Birth: October 21, 1955  Today's Date: 04/16/2021 PT Individual Time: 0900-1005 PT Individual Time Calculation (min): 65 min   Short Term Goals: Week 4:  PT Short Term Goal 1 (Week 4): STGs = LTGs  Skilled Therapeutic Interventions/Progress Updates:     Patient in bed asleep upon PT arrival. Patient slow to arouse to verbal stimulation and agreeable to PT session. Patient denied pain during session. Patient reports that he had a poor nights sleep and feels very fatigued this morning. Noted increased time and need for assist with mobility due to fatigue. Spent increased time during session reviewing education on sleep effects on deficits during stroke recovery, energy conservation techniques and use of squat pivot transfers with fatigue or min A from his caregivers. Patient in agreement.   Therapeutic Activity: Bed Mobility: Donned B knee high TEDs with total A prior to mobility. Patient performed supine to/from sit with min A for trunk and L lower extremity management in a flat bed without use of bed rails. Provided verbal cues for progressing through side-lying and bringing knees to chest to lift lower extremities onto the bed for reduced effort and assist with mobility. Donned tennis shoes with min A for adjusting the back of the heel seated EOB.  Transfers: Patient attempted sit to/from stand x3 from elevated bed height without assist, he then performed sit to/from stand from elevated bed with min-mod A using RW and progressed to pivot to standard BSC  with CGA using RW with L hand splint. Provided verbal cues for forward weight shift and L foot placement. Patient was unable to adjust his position or stand from the standard BSC due to the tight width of the BSC around his hips. Required PT to drop the arm of the [facility] to allow patient to stand and to switch out to the bariatric BSC. Patient able to adjust position  easily on bariatric model. Patient was continent of bowl and bladder during toileting, required total a for peri-care and lower body clothing management in standing due to increased fatigue this morning. He progressed to pivoting back to bed at this time due to fatigue.   Reviewed d/c planning and reached out to CSW about switching out standard [facility] for bariatric BSC due to patient's hip width. Also, recommend drop arm to allow for lateral scoot transfers with increased faitgue. Provided patient with extra Velcro for PRAFO strap that fell off and large L hand splint for RW.  Patient in bed at end of session with breaks locked, bed alarm set, and all needs within reach.   Therapy Documentation Precautions:  Precautions Precautions: Fall Precaution Comments: Lt hemi with Lt sided edema Restrictions Weight Bearing Restrictions: No   Therapy/Group: Individual Therapy  Danniell Rotundo L Fionnuala Hemmerich PT, DPT  04/16/2021, 2:39 PM

## 2021-04-17 LAB — GLUCOSE, CAPILLARY
Glucose-Capillary: 117 mg/dL — ABNORMAL HIGH (ref 70–99)
Glucose-Capillary: 142 mg/dL — ABNORMAL HIGH (ref 70–99)
Glucose-Capillary: 130 mg/dL — ABNORMAL HIGH (ref 70–99)
Glucose-Capillary: 89 mg/dL (ref 70–99)

## 2021-04-17 LAB — BASIC METABOLIC PANEL
Anion gap: 8 (ref 5–15)
BUN: 18 mg/dL (ref 8–23)
CO2: 31 mmol/L (ref 22–32)
Calcium: 8.9 mg/dL (ref 8.9–10.3)
Chloride: 102 mmol/L (ref 98–111)
Creatinine, Ser: 0.96 mg/dL (ref 0.61–1.24)
GFR, Estimated: >60 mL/min (ref >60)
Glucose, Bld: 119 mg/dL — ABNORMAL HIGH (ref 70–99)
Potassium: 3.8 mmol/L (ref 3.5–5.1)
Sodium: 141 mmol/L (ref 135–145)

## 2021-04-17 NOTE — Progress Notes (Signed)
PROGRESS NOTE   Subjective/Complaints:  Pt reports slept well- LBM yesterday- - no issues- d/c tomorrow- family training done.    ROS:  Pt denies SOB, abd pain, CP, N/V/C/D, and vision changes   Objective:   No results found. No results for input(s): WBC, HGB, HCT, PLT in the last 72 hours.    Recent Labs    04/17/21 0533  NA 141  K 3.8  CL 102  CO2 31  GLUCOSE 119*  BUN 18  CREATININE 0.96  CALCIUM 8.9        Intake/Output Summary (Last 24 hours) at 04/17/2021 1858 Last data filed at 04/17/2021 1247 Gross per 24 hour  Intake 826 ml  Output 950 ml  Net -124 ml        Physical Exam: Vital Signs Blood pressure (!) 118/59, pulse 70, temperature 98.6 F (37 C), resp. rate 18, height 6' 0.99" (1.854 m), weight 118.5 kg, SpO2 92 %.    General: awake, alert, appropriate, sitting up in bed; NAD HENT: conjugate gaze; oropharynx moist CV: regular rate; no JVD Pulmonary: CTA B/L; no W/R/R- good air movement GI: soft, NT, ND, (+)BS Psychiatric: appropriate Neurological: alert Ext: no clubbing, cyanosis, or edema Psych: pleasant and cooperative  Skin: intact, chronic changes in LE's Musculoskeletal: left hand with tr edema--k-tape     tr  LE edema B/L     Neurological:     Mental Status: He is alert.     Comments: Alert and oriented x 3. Normal insight and awareness. Intact Memory. Normal language and speech. Cranial nerve exam unremarkable except for LLQ quadrantanopsia remains present.  LUE 2- /5 pec, biceps and 2+/5 wrist and finger flexion, 1/5 wrist extension.    LLE   3-/5 HE, KE and 2/5 PF, 1-2/5 DF--> motor exam stable. RUE and RLE grossly 5/5.         Assessment/Plan: 1. Functional deficits which require 3+ hours per day of interdisciplinary therapy in a comprehensive inpatient rehab setting. Physiatrist is providing close team supervision and 24 hour management of active medical problems  listed below. Physiatrist and rehab team continue to assess barriers to discharge/monitor patient progress toward functional and medical goals  Care Tool:  Bathing    Body parts bathed by patient: Left arm, Chest, Abdomen, Front perineal area, Right upper leg, Left upper leg, Face, Right lower leg, Left lower leg, Buttocks, Right arm   Body parts bathed by helper: Right arm, Buttocks     Bathing assist Assist Level: Contact Guard/Touching assist Assistive Device Comment: LH sponge, BSC cut out in shower   Upper Body Dressing/Undressing Upper body dressing   What is the patient wearing?: Pull over shirt    Upper body assist Assist Level: Set up assist    Lower Body Dressing/Undressing Lower body dressing      What is the patient wearing?: Underwear/pull up, Pants     Lower body assist Assist for lower body dressing: Supervision/Verbal cueing Assistive Device Comment: reacher   Toileting Toileting Toileting Activity did not occur (Clothing management and hygiene only): N/A (no void or bm)  Toileting assist Assist for toileting: Contact Guard/Touching assist     Transfers  Chair/bed transfer  Transfers assist     Chair/bed transfer assist level: Contact Guard/Touching assist     Locomotion Ambulation   Ambulation assist   Ambulation activity did not occur: Safety/medical concerns  Assist level: Moderate Assistance - Patient 50 - 74% Assistive device: Walker-rolling Max distance: 170'   Walk 10 feet activity   Assist  Walk 10 feet activity did not occur: Safety/medical concerns  Assist level: Moderate Assistance - Patient - 50 - 74% Assistive device: Walker-rolling   Walk 50 feet activity   Assist Walk 50 feet with 2 turns activity did not occur: Safety/medical concerns  Assist level: Moderate Assistance - Patient - 50 - 74% Assistive device: Walker-rolling    Walk 150 feet activity   Assist Walk 150 feet activity did not occur: Safety/medical  concerns  Assist level: Moderate Assistance - Patient - 50 - 74% Assistive device: Walker-rolling    Walk 10 feet on uneven surface  activity   Assist Walk 10 feet on uneven surfaces activity did not occur: Safety/medical concerns         Wheelchair     Assist Is the patient using a wheelchair?: Yes Type of Wheelchair: Manual    Wheelchair assist level: Supervision/Verbal cueing Max wheelchair distance: 100    Wheelchair 50 feet with 2 turns activity    Assist        Assist Level: Supervision/Verbal cueing   Wheelchair 150 feet activity     Assist      Assist Level: Supervision/Verbal cueing   Blood pressure (!) 118/59, pulse 70, temperature 98.6 F (37 C), resp. rate 18, height 6' 0.99" (1.854 m), weight 118.5 kg, SpO2 92 %.    Medical Problem List and Plan: 1. 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 ?embolic. 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             -30 day outpatient cardiac event monitor recommended to r/o a fib              -patient may  shower             -ELOS/Goals: 04/18/21, supervision to min assist goals with PT and OT  -Continue CIR therapies including PT, OT AFO trial. D/c tomorrow- con't CIR    2.  Antithrombotics: -DVT/anticoagulation:  Mechanical: Sequential compression devices, below knee Bilateral lower extremities Pharmaceutical: Lovenox             -antiplatelet therapy: aspirin 81 mg 3. Pain Management: Tylenol  -OA right knee with associated pain   -continue with voltaren gel 4. Mood: neuropsych has seen him               -antipsychotic agents: n/a 5. Neuropsych: This patient is capable of making decisions on his own behalf. 6. Skin/Wound Care: routine skin checks.  --skin improved 7. Fluids/Electrolytes/Nutrition: eating well  -protein supp for hypoalbuminemia   K+  3.4 still on 1/28, labs reviewed--increase to bid   1/30- K+ up to 3.8 8. Hypertension: continue Avapro and coreg.    -diet discussed  coreg increased to 12.5mg  with borderline  control.   -added hctz for bp control and LE edema --improved 9. 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 Hgb A1c = 11.1             -monitor cbg's, adjust regimen as needed  Carb modified diet.   CBG (last  3)  Recent Labs    04/17/21 0623 04/17/21 1136 04/17/21 1656  GLUCAP 117* 142* 130*               -Levemir  39u bid         1/28-resumed his home farxiga 1/23 with improvement  1/30- CBGs better controlled- con't regimen 10: Vitamin D deficiency: started on 1,000mg  daily supplement.  11: Hypothyroidism: continue Synthroid and recheck labs in 6 weeks 12: Peripheral neuropathy: continue gabapentin 300 mg twice daily at home 13: Herpes zoster of chest: continue Valtrex for total of 2-3 weeks 14: OSA: home BiPAP, 2L O2  at night and naps 15: History of depression: Cymbalta listed on home meds 16: Tobacco use: Offer nicotine patch and provide cessation counseling 17: EtOH use:   Magnesium level reviewed and was 2.2 on 12/21.  Counseling as appropriate 18: Obesity BMI 37.53: Dietary consultation. Provided list of foods that can help with weight loss.  19: Venous insufficiency, bilateral lower extremities: Has had treatment for venous stasis ulcers including Unna boots and follow-up at [facility].  Currently with small superficial ulcerations bilateral lower legs.  He routinely wears compression stockings. Legs are stable  1/28 continue hctz in place of lasix   -bp's better and swelling controlled 20. 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   -taking meds with food/drink  1/30- no complaints today - con't regimen   LOS: 31 days A FACE TO FACE EVALUATION WAS PERFORMED  Kenyata Napier 04/17/2021, 6:58 PM

## 2021-04-17 NOTE — Progress Notes (Signed)
Placed patient on bipap for the night with oxygen set at 2lpm  

## 2021-04-17 NOTE — Progress Notes (Signed)
Patient ID: Joseph Ortega, male   DOB: [birth year], 66 y.o.   MRN: [id number]   Diagnosis codes: ICH (I61.9)  Height:              6'1"  Weight:          261 lbs      Due to patient's hip width, increased fatigue, and hand placement needed for lateral scoot transfers, a  bariatric drop arm bedside commode is most appropriate, as a standard drop arm bedside commode would not allow patient to be safe to use when in the home.

## 2021-04-17 NOTE — Progress Notes (Signed)
Inpatient Rehabilitation Discharge Medication Review by a Pharmacist  A complete drug regimen review was completed for this patient to identify any potential clinically significant medication issues.  High Risk Drug Classes Is patient taking? Indication by Medication  Antipsychotic No   Anticoagulant No   Antibiotic No   Opioid No   Antiplatelet Yes Aspirin- CVA proph  Hypoglycemics/insulin Yes Farxiga, insulin- T2DM  Vasoactive Medication Yes Doxazosin, Coreg, Avapro, HCTZ- BPH, hypertension  Chemotherapy No   Other Yes Gabapentin- neuropathic pain Lipitor- HLD Synthroid- hypothyroidism Duloxetine- MDD/Anxiety     Type of Medication Issue Identified Description of Issue Recommendation(s)  Drug Interaction(s) (clinically significant)     Duplicate Therapy     Allergy     No Medication Administration End Date     Incorrect Dose     Additional Drug Therapy Needed     Significant med changes from prior encounter (inform family/care partners about these prior to discharge).    Other       Clinically significant medication issues were identified that warrant physician communication and completion of prescribed/recommended actions by midnight of the next day:  No  Time spent performing this drug regimen review (minutes):  30   Wesly Whisenant BS, PharmD, BCPS Clinical Pharmacist 04/17/2021 10:44 AM

## 2021-04-17 NOTE — Progress Notes (Signed)
Patient ID: Joseph Ortega, male   DOB: Sep 09, 1955, 66 y.o.   MRN: [id number]  SW ordered bariatric DABSC with [employer] via parachute, as therapy recommended need.   SW met with pt in room to discuss above.   Loralee Pacas, MSW, [place] Office: [phone] Cell: [phone] Fax: [phone]

## 2021-04-17 NOTE — Plan of Care (Signed)
Problem: RH Grooming Goal: LTG Patient will perform grooming w/assist,cues/equip (OT) Description: LTG: Patient will perform grooming with assist, with/without cues using equipment (OT) Outcome: Completed/Met   Problem: RH Bathing Goal: LTG Patient will bathe all body parts with assist levels (OT) Description: LTG: Patient will bathe all body parts with assist levels (OT) Outcome: Completed/Met   Problem: RH Dressing Goal: LTG Patient will perform upper body dressing (OT) Description: LTG Patient will perform upper body dressing with assist, with/without cues (OT). Outcome: Completed/Met Goal: LTG Patient will perform lower body dressing w/assist (OT) Description: LTG: Patient will perform lower body dressing with assist, with/without cues in positioning using equipment (OT) Outcome: Completed/Met   Problem: RH Toileting Goal: LTG Patient will perform toileting task (3/3 steps) with assistance level (OT) Description: LTG: Patient will perform toileting task (3/3 steps) with assistance level (OT)  Outcome: Completed/Met   Problem: RH Toilet Transfers Goal: LTG Patient will perform toilet transfers w/assist (OT) Description: LTG: Patient will perform toilet transfers with assist, with/without cues using equipment (OT) Outcome: Completed/Met   Problem: RH Tub/Shower Transfers Goal: LTG Patient will perform tub/shower transfers w/assist (OT) Description: LTG: Patient will perform tub/shower transfers with assist, with/without cues using equipment (OT) Outcome: Completed/Met   Problem: RH Awareness Goal: LTG: Patient will demonstrate awareness during functional activites type of (OT) Description: LTG: Patient will demonstrate awareness during functional activites type of (OT) Outcome: Completed/Met

## 2021-04-17 NOTE — Progress Notes (Signed)
Physical Therapy Session Note  Patient Details  Name: Joseph Ortega MRN: [id number] Date of Birth: 02/03/1956  Today's Date: 04/17/2021 PT Individual Time: 1916-6060 PT Individual Time Calculation (min): 32 min   Short Term Goals: Week 1:  PT Short Term Goal 1 (Week 1): Pt will perform be mobility with modA. PT Short Term Goal 1 - Progress (Week 1): Met PT Short Term Goal 2 (Week 1): Pt will perform sit to stand with maxA +1. PT Short Term Goal 2 - Progress (Week 1): Met PT Short Term Goal 3 (Week 1): Pt will perform bed to chair transfer with modA +1 consistently. PT Short Term Goal 3 - Progress (Week 1): Progressing toward goal PT Short Term Goal 4 (Week 1): Pt will ambulate x25' with modA +2 and LRAD. PT Short Term Goal 4 - Progress (Week 1): Met Week 2:  PT Short Term Goal 1 (Week 2): Pt will perform bed mobility consistently with minA. PT Short Term Goal 1 - Progress (Week 2): Met PT Short Term Goal 2 (Week 2): Pt will perform sit to stand consistently with minA. PT Short Term Goal 2 - Progress (Week 2): Progressing toward goal PT Short Term Goal 3 (Week 2): Pt will perform bed to chair consistently with minA. PT Short Term Goal 3 - Progress (Week 2): Progressing toward goal PT Short Term Goal 4 (Week 2): Pt will ambulate x50' with modA +1 and LRAD. PT Short Term Goal 4 - Progress (Week 2): Progressing toward goal Week 3:  PT Short Term Goal 1 (Week 3): Pt will consistently perform bed mobility with CGA. PT Short Term Goal 1 - Progress (Week 3): Met PT Short Term Goal 2 (Week 3): Pt will consistently perform sit to stand with minA. PT Short Term Goal 2 - Progress (Week 3): Progressing toward goal PT Short Term Goal 3 (Week 3): Pt will consistently perform bed to chair transfer with minA. PT Short Term Goal 3 - Progress (Week 3): Progressing toward goal PT Short Term Goal 4 (Week 3): Pt will ambulate x50' with modA +1 and LRAD. PT Short Term Goal 4 - Progress (Week 3): Met Week 4:   PT Short Term Goal 1 (Week 4): STGs = LTGs Week 5:     Skilled Therapeutic Interventions/Progress Updates:    Pt seated in w/c on arrival and agreeable to therapy. No complaint of pain. Pt agreeable attempt stairs in preparation for d/c tomorrow. Pt transported to therapy gym for time management and energy conservation. Pt stood to stair rail with CGA throughout session. Pt navigated 3" steps x 2 and x 3 with min A. Progressed to 6" stairs, pt navigated 6" steps x 1 and x 4 with mod A, descending with backwards navigation. Pt required assist placing L foot down at times to prevent crossing mid line. Occ assist with LUE placement. Pt returned to room and set up for transfer mod, Stand pivot transfer with bed rail CGA. Pt returned to bed and was left with all needs in reach and alarm active.   Therapy Documentation Precautions:  Precautions Precautions: Fall Precaution Comments: Lt hemi with Lt sided edema Restrictions Weight Bearing Restrictions: No    Therapy/Group: Individual Therapy  Mickel Fuchs 04/17/2021, 3:44 PM

## 2021-04-17 NOTE — Progress Notes (Signed)
Physical Therapy Session Note  Patient Details  Name: Joseph Ortega MRN: [id number] Date of Birth: Oct 16, 1955  Today's Date: 04/17/2021 PT Individual Time: 1015-1115 PT Individual Time Calculation (min): 60 min   Short Term Goals: Week 4:  PT Short Term Goal 1 (Week 4): STGs = LTGs  Skilled Therapeutic Interventions/Progress Updates:     Patient in bed upon PT arrival. Patient alert and agreeable to PT session. Patient reported 3/10 anterior knee pain with standing only during session, RN made aware. PT provided repositioning, rest breaks, and distraction as pain interventions throughout session. Patient reported improved sleep quality last night and reduced fatigue this morning.   Therapeutic Activity: Bed Mobility: Patient performed supine to sit with min A in a flat bed without use of bed rail with min cues for technique.  Transfers: Patient with difficulty initiating sit to stand without physical assistance x3, then performed sit to/from stand x1 with min A using RW with facilitation for forward weight shift and cues for foot positioning and glute activation for hip/trunk extension. He performed squat pivot w/c<>mat table with CGA and cues for removal of arm rest and hand placement.  Patient performed a simulated midsize SUV height car transfer with min A using RW and min A for L lower extremity management. Provided cues for safe technique, as patient initially set-up to pull up on the car frame and turn 180 deg to sit in the car without the RW. Patient stated understanding for stand pivot technique with RW after performing transfer, may need continued reinforcement from family at d/c.  Wheelchair Mobility:  Patient propelled wheelchair >150 feet with supervision-mod I using R hemi-technique. Provided verbal cues for parts management and w/c set-up for transfers.   Performed d/c assessment of pain interference, vision, and strength, see d/c note for details. Educated patient on fall  risk/prevention, home modifications to prevent falls, and activation of emergency services in the event of a fall during session.   Patient demonstrates poor incite into deficits stated he could get up from the floor on his own after a fall and that he thought he could drive at d/c. Provided education on deficits that limit the patient's safe ability to perform these activities and recommended the patient call for emergency services to assist if he has a fall for patient and caregiver safety and not recommending patient drive at this time due to visual, cognitive, and motor deficits. Patient stated understanding.   Patient in w/c in the room at end of session with breaks locked and all needs within reach.   Therapy Documentation Precautions:  Precautions Precautions: Fall Precaution Comments: Lt hemi with Lt sided edema Restrictions Weight Bearing Restrictions: No    Therapy/Group: Individual Therapy  Mckenzey Parcell L Conrado Nance PT, DPT  04/17/2021, 4:42 PM

## 2021-04-18 ENCOUNTER — Other Ambulatory Visit (HOSPITAL_COMMUNITY): Payer: Self-pay

## 2021-04-18 DIAGNOSIS — I872 Venous insufficiency (chronic) (peripheral): Secondary | ICD-10-CM

## 2021-04-18 LAB — GLUCOSE, CAPILLARY
Glucose-Capillary: 116 mg/dL — ABNORMAL HIGH (ref 70–99)
Glucose-Capillary: 159 mg/dL — ABNORMAL HIGH (ref 70–99)

## 2021-04-18 MED ORDER — TRAZODONE HCL 50 MG PO TABS: 50.0000 mg | ORAL_TABLET | Freq: Every day | ORAL | 0 refills | Status: DC

## 2021-04-18 MED ORDER — DOCUSATE SODIUM 100 MG PO CAPS: 100.0000 mg | ORAL_CAPSULE | Freq: Two times a day (BID) | ORAL | 0 refills | Status: DC | PRN

## 2021-04-18 MED ORDER — HYDROCHLOROTHIAZIDE 25 MG PO TABS: 25.0000 mg | ORAL_TABLET | Freq: Every day | ORAL | 0 refills | Status: DC

## 2021-04-18 MED ORDER — INSULIN DETEMIR 100 UNIT/ML FLEXPEN: 39.0000 [IU] | PEN_INJECTOR | Freq: Two times a day (BID) | SUBCUTANEOUS | 11 refills | Status: DC

## 2021-04-18 MED ORDER — DAPAGLIFLOZIN PROPANEDIOL 5 MG PO TABS: 5.0000 mg | ORAL_TABLET | Freq: Every day | ORAL | 0 refills | Status: DC

## 2021-04-18 MED ORDER — ATORVASTATIN CALCIUM 40 MG PO TABS: 40.0000 mg | ORAL_TABLET | Freq: Every day | ORAL | 0 refills | Status: DC

## 2021-04-18 MED ORDER — NOVOLOG FLEXPEN 100 UNIT/ML ~~LOC~~ SOPN: 14.0000 [IU] | PEN_INJECTOR | Freq: Three times a day (TID) | SUBCUTANEOUS | 11 refills | Status: DC

## 2021-04-18 MED ORDER — AURORA PEN NEEDLES 31G X 6 MM MISC
1.0000 | Freq: Every day | 0 refills | Status: DC
Start: 2021-04-18 — End: 2022-05-03

## 2021-04-18 MED ORDER — ASPIRIN 81 MG PO TBEC: 81.0000 mg | DELAYED_RELEASE_TABLET | Freq: Every day | ORAL | 11 refills | Status: DC

## 2021-04-18 MED ORDER — METOCLOPRAMIDE HCL 5 MG PO TABS: 5.0000 mg | ORAL_TABLET | Freq: Three times a day (TID) | ORAL | 0 refills | Status: DC

## 2021-04-18 MED ORDER — NOVOLOG FLEXPEN 100 UNIT/ML ~~LOC~~ SOPN
14.0000 [IU] | PEN_INJECTOR | Freq: Three times a day (TID) | SUBCUTANEOUS | 11 refills | Status: DC
  Filled 2021-04-18: qty 15, fill #0

## 2021-04-18 MED ORDER — CARVEDILOL 12.5 MG PO TABS: 12.5000 mg | ORAL_TABLET | Freq: Two times a day (BID) | ORAL | 0 refills | Status: DC

## 2021-04-18 MED ORDER — B COMPLEX-C PO TABS: 1.0000 | ORAL_TABLET | Freq: Every day | ORAL | Status: DC

## 2021-04-18 MED ORDER — DICLOFENAC SODIUM 1 % EX GEL: 2.0000 g | Freq: Three times a day (TID) | CUTANEOUS | 0 refills | Status: DC

## 2021-04-18 MED ORDER — ACETAMINOPHEN 325 MG PO TABS: 650.0000 mg | ORAL_TABLET | Freq: Four times a day (QID) | ORAL | Status: DC | PRN

## 2021-04-18 MED ORDER — VALSARTAN 320 MG PO TABS: 320.0000 mg | ORAL_TABLET | Freq: Every day | ORAL | 0 refills | Status: DC

## 2021-04-18 MED ORDER — NAPHAZOLINE-GLYCERIN 0.012-0.25 % OP SOLN: 1.0000 [drp] | Freq: Four times a day (QID) | OPHTHALMIC | Status: DC | PRN

## 2021-04-18 MED ORDER — POTASSIUM CHLORIDE CRYS ER 20 MEQ PO TBCR: 20.0000 meq | EXTENDED_RELEASE_TABLET | Freq: Two times a day (BID) | ORAL | 0 refills | Status: DC

## 2021-04-18 MED ORDER — MIRTAZAPINE 15 MG PO TABS: 7.5000 mg | ORAL_TABLET | Freq: Every day | ORAL | 0 refills | Status: DC

## 2021-04-18 MED ORDER — INSULIN ASPART 100 UNIT/ML IJ SOLN
0.0000 [IU] | Freq: Every day | INTRAMUSCULAR | 11 refills | Status: DC
Start: 2021-04-18 — End: 2021-04-18

## 2021-04-18 MED ORDER — JUVEN PO PACK: 1.0000 | PACK | Freq: Every day | ORAL | 1 refills | Status: DC

## 2021-04-18 MED ORDER — INSULIN ASPART 100 UNIT/ML IJ SOLN: 0.0000 [IU] | Freq: Three times a day (TID) | INTRAMUSCULAR | 11 refills | Status: DC

## 2021-04-18 NOTE — Progress Notes (Addendum)
PA in with pt to discuss discharge instructions. Pt in agreement. Belongings gathered. Pt left per wheelchair to private vehicle. No  Complications Mylo Red, LPN

## 2021-04-18 NOTE — Progress Notes (Signed)
PROGRESS NOTE   Subjective/Complaints:  Pt in good spirits. Feels prepared to go home today. His pastor is going to pick him up.   ROS: Patient denies fever, rash, sore throat, blurred vision, dizziness, nausea, vomiting, diarrhea, cough, shortness of breath or chest pain, joint or back/neck pain, headache, or mood change.    Objective:   No results found. No results for input(s): WBC, HGB, HCT, PLT in the last 72 hours.    Recent Labs    04/17/21 0533  NA 141  K 3.8  CL 102  CO2 31  GLUCOSE 119*  BUN 18  CREATININE 0.96  CALCIUM 8.9        Intake/Output Summary (Last 24 hours) at 04/18/2021 0923 Last data filed at 04/18/2021 0913 Gross per 24 hour  Intake 1304 ml  Output 1275 ml  Net 29 ml        Physical Exam: Vital Signs Blood pressure (!) 138/56, pulse 64, temperature 98 F (36.7 C), temperature source Oral, resp. rate 18, height 6' 0.99" (1.854 m), weight 118.5 kg, SpO2 94 %.    Constitutional: No distress . Vital signs reviewed. HEENT: NCAT, EOMI, oral membranes moist Neck: supple Cardiovascular: RRR without murmur. No JVD    Respiratory/Chest: CTA Bilaterally without wheezes or rales. Normal effort    GI/Abdomen: BS +, non-tender, non-distended Ext: no clubbing, cyanosis, only tr edema Psych: pleasant and cooperative  Skin: intact, chronic changes in LE's Musculoskeletal: left hand with tr edema--k-tape     tr  LE edema B/L --overall improved    Neurological:     Mental Status: He is alert.     Comments: Alert and oriented x 3. Normal insight and awareness. Intact Memory. Normal language and speech. Cranial nerve exam remains unremarkable except for LLQ quadrantanopsia remains present.  LUE 2- /5 pec, biceps and 2+/5 wrist and finger flexion, 1/5 wrist extension.    LLE   3-/5 HE, KE and 2/5 PF, 1-2/5 DF--> motor exam stable. RUE and RLE grossly 5/5.         Assessment/Plan: 1.  Functional deficits which require 3+ hours per day of interdisciplinary therapy in a comprehensive inpatient rehab setting. Physiatrist is providing close team supervision and 24 hour management of active medical problems listed below. Physiatrist and rehab team continue to assess barriers to discharge/monitor patient progress toward functional and medical goals  Care Tool:  Bathing    Body parts bathed by patient: Left arm, Chest, Abdomen, Front perineal area, Right upper leg, Left upper leg, Face, Right lower leg, Left lower leg, Buttocks, Right arm   Body parts bathed by helper: Right arm, Buttocks     Bathing assist Assist Level: Contact Guard/Touching assist Assistive Device Comment: LH sponge, BSC cut out in shower   Upper Body Dressing/Undressing Upper body dressing   What is the patient wearing?: Pull over shirt    Upper body assist Assist Level: Set up assist    Lower Body Dressing/Undressing Lower body dressing      What is the patient wearing?: Underwear/pull up, Pants     Lower body assist Assist for lower body dressing: Supervision/Verbal cueing Assistive Device Comment: reacher  Toileting Toileting Toileting Activity did not occur Press photographer and hygiene only): N/A (no void or bm)  Toileting assist Assist for toileting: Contact Guard/Touching assist     Transfers Chair/bed transfer  Transfers assist     Chair/bed transfer assist level: Contact Guard/Touching assist     Locomotion Ambulation   Ambulation assist   Ambulation activity did not occur: Safety/medical concerns  Assist level: Moderate Assistance - Patient 50 - 74% Assistive device: Walker-rolling Max distance: 170'   Walk 10 feet activity   Assist  Walk 10 feet activity did not occur: Safety/medical concerns  Assist level: Moderate Assistance - Patient - 50 - 74% Assistive device: Walker-rolling   Walk 50 feet activity   Assist Walk 50 feet with 2 turns activity  did not occur: Safety/medical concerns  Assist level: Moderate Assistance - Patient - 50 - 74% Assistive device: Walker-rolling    Walk 150 feet activity   Assist Walk 150 feet activity did not occur: Safety/medical concerns  Assist level: Moderate Assistance - Patient - 50 - 74% Assistive device: Walker-rolling    Walk 10 feet on uneven surface  activity   Assist Walk 10 feet on uneven surfaces activity did not occur: Safety/medical concerns         Wheelchair     Assist Is the patient using a wheelchair?: Yes Type of Wheelchair: Manual    Wheelchair assist level: Supervision/Verbal cueing Max wheelchair distance: >150 ft    Wheelchair 50 feet with 2 turns activity    Assist        Assist Level: Supervision/Verbal cueing   Wheelchair 150 feet activity     Assist      Assist Level: Supervision/Verbal cueing   Blood pressure (!) 138/56, pulse 64, temperature 98 F (36.7 C), temperature source Oral, resp. rate 18, height 6' 0.99" (1.854 m), weight 118.5 kg, SpO2 94 %.    Medical Problem List and Plan: 1. 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 ?embolic. 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             -30 day outpatient cardiac event monitor recommended to r/o a fib              -dc home today -f/u with [facility], primary, neuro   2. Antiplatelet therapy: aspirin 81 mg 3. Pain Management: Tylenol  -OA right knee with associated pain   -continue with voltaren gel 4 Hypokalemia: continue kdur daily 8. Hypertension: continue Avapro and coreg.    -hctz with improved control 9. DM-insulin requiring:   Hgb A1c = 11.1             Levemir  39u bid         1/28-resumed his home farxiga 1/23 with improvement  1/31- CBGs better controlled-  10: Vitamin D deficiency: started on 1,000mg  daily supplement.   11: Hypothyroidism: continue Synthroid and recheck labs in 6 weeks 12: Peripheral neuropathy: continue gabapentin 300 mg twice daily at home 13: Herpes zoster of chest: completed valtrex 14: OSA: home BiPAP, 2L O2  at night and naps 15: History of depression: Cymbalta listed on home meds 16: Tobacco use/etoh abuse: Offer nicotine patch and provide cessation c  18: Obesity weight loss discussed.  19: Venous insufficiency, bilateral lower extremities: Has had treatment for venous stasis ulcers including Unna boots and follow-up at [facility] wound care center.  Currently with small  superficial ulcerations bilateral lower legs.  He routinely wears compression stockings. Legs are stable   continue hctz in place of lasix     20. 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   -taking meds with food/drink        LOS: 32 days A FACE TO FACE EVALUATION WAS PERFORMED  04/18/2021, 9:23 AM

## 2021-04-19 NOTE — Progress Notes (Signed)
Inpatient Rehabilitation Care Coordinator Discharge Note   Patient Details  Name: Joseph Ortega MRN: [id number] Date of Birth: 23-May-1955   Discharge location: D/c to home with his wife who will can provide supervision  Length of Stay: 31 days  Discharge activity level: Supervision at w/c level  Home/community participation: Limited  Patient response HQ:[id number] Literacy - How often do you need to have someone help you when you read instructions, pamphlets, or other written material from your doctor or pharmacy?: Never  Patient response [id number] Isolation - How often do you feel lonely or isolated from those around you?: Rarely  Services provided included: MD, RD, PT, OT, SLP, RN, CM, Pharmacy, Neuropsych, SW, TR  Financial Services:  Field seismologist Utilized: Barrister's clerk  Choices offered to/list presented to: Yes  Follow-up services arranged:  Home Health, DME Home Health Agency: [facility] for HHPT/OT/SLP    DME : bariatric DABSC, wheelchair and RW- [facility]    Patient response to transportation need: Is the patient able to respond to transportation needs?: Yes In the past 12 months, has lack of transportation kept you from medical appointments or from getting medications?: No In the past 12 months, has lack of transportation kept you from meetings, work, or from getting things needed for daily living?: No   Comments (or additional information):  Patient/Family verbalized understanding of follow-up arrangements:  Yes  Individual responsible for coordination of the follow-up plan: contact pt or pt wife [name]  Confirmed correct DME delivered: Gretchen Short 04/19/2021    Gretchen Short

## 2021-05-01 ENCOUNTER — Encounter: Payer: BC Managed Care – PPO | Admitting: Registered Nurse

## 2021-05-02 ENCOUNTER — Telehealth: Payer: Self-pay | Admitting: Internal Medicine

## 2021-05-02 NOTE — Telephone Encounter (Signed)
°   Joseph Ortega DOB: Dec 22, 1955 MRN: [id number]   RIDER WAIVER AND RELEASE OF LIABILITY  For purposes of improving physical access to our facilities, [place] is pleased to partner with third parties to provide [place] patients or other authorized individuals the option of convenient, on-demand ground transportation services (the [employer]) through use of the technology service that enables users to request on-demand ground transportation from independent third-party providers.  By opting to use and accept these [employer], I, the undersigned, hereby agree on behalf of myself, and on behalf of any minor child using the Science writer for whom I am the parent or legal guardian, as follows:  Science writer provided to me are provided by independent third-party transportation providers who are not [employer] or employees and who are unaffiliated with [employer]. [place] is neither a transportation carrier nor a common or public carrier. [place] has no control over the quality or safety of the transportation that occurs as a result of the [employer]. [place] cannot guarantee that any third-party transportation provider will complete any arranged transportation service. [facility] makes no representation, warranty, or guarantee regarding the reliability, timeliness, quality, safety, suitability, or availability of any of the Transport Services or that they will be error free. I fully understand that traveling by vehicle involves risks and dangers of serious bodily injury, including permanent disability, paralysis, and death. I agree, on behalf of myself and on behalf of any minor child using the Transport Services for whom I am the parent or legal guardian, that the entire risk arising out of my use of the [employer] remains solely with me, to the maximum extent permitted under applicable law. The [employer] are provided as is  and as available. [place] disclaims all representations and warranties, express, implied or statutory, not expressly set out in these terms, including the implied warranties of merchantability and fitness for a particular purpose. I hereby waive and release [place], its agents, employees, officers, directors, representatives, insurers, attorneys, assigns, successors, subsidiaries, and affiliates from any and all past, present, or future claims, demands, liabilities, actions, causes of action, or suits of any kind directly or indirectly arising from acceptance and use of the [employer]. I further waive and release [place] and its affiliates from all present and future liability and responsibility for any injury or death to persons or damages to property caused by or related to the use of the [employer]. I have read this Waiver and Release of Liability, and I understand the terms used in it and their legal significance. This Waiver is freely and voluntarily given with the understanding that my right (as well as the right of any minor child for whom I am the parent or legal guardian using the [employer]) to legal recourse against  in connection with the [employer] is knowingly surrendered in return for use of these services.   I attest that I read the consent document to Wiliam Ke, gave Mr. Kerwin the opportunity to ask questions and answered the questions asked (if any). I affirm that Jaylynn Siefert then provided consent for he's participation in this program.     Ephriam Knuckles Vilsaint

## 2021-05-09 ENCOUNTER — Other Ambulatory Visit: Payer: Self-pay

## 2021-05-09 ENCOUNTER — Encounter: Payer: Self-pay | Admitting: Registered Nurse

## 2021-05-09 ENCOUNTER — Encounter: Payer: BC Managed Care – PPO | Attending: Registered Nurse | Admitting: Registered Nurse

## 2021-05-09 VITALS — BP 118/70 | HR 69 | Ht 73.0 in | Wt 268.0 lb

## 2021-05-09 DIAGNOSIS — E1165 Type 2 diabetes mellitus with hyperglycemia: Secondary | ICD-10-CM | POA: Diagnosis not present

## 2021-05-09 DIAGNOSIS — I10 Essential (primary) hypertension: Secondary | ICD-10-CM | POA: Diagnosis not present

## 2021-05-09 NOTE — Progress Notes (Signed)
Subjective:    Patient ID: Joseph Ortega, male    DOB: October 02, 1955, 66 y.o.   MRN: [id number]  HPI: Joseph Ortega is a 66 y.o. male who is here for HFU appointment of his Intracerebral Hemorrhage, Essential Hypertension and Uncontrolled Type 2 DM with Hyperglycemia. He presented to [facility] on 02/17/2021 with left upper and lower extremity weakness.  H&P: 03/08/2021: Dr. Jari Pigg   HPI Joseph Ortega is a 66 y.o. male with diabetes, hypertension who comes in with weakness.  Patient states that he woke up at 2 AM and was at work.  He states that he felt his normal self and then at 7:30 AM he had sudden onset of left-sided weakness, constant, gradually improving, nothing made it better or worse.  States that seems much better at this point but still having weakness mostly in the left arm.  Denies any changes in speech or any other concerns.  He does report a rash underneath his left breast that is been there for 2 to 3 days.  Denies any history of shingles  He was transferred to [facility]   CT Head WO Contrast:  IMPRESSION: 1. 4 mm intraparenchymal hematoma in the right posterior frontal lobe, apparently with subarachnoid penetration. Subarachnoid blood within the sulci of the right posterior frontal region and right sylvian fissure. 2. This pattern of hemorrhage would ordinarily raise concern about a berry aneurysm, but no aneurysm was seen on CT angiography 06/16/2017. Additionally, the suspected presence of a intraparenchymal hemorrhage with subarachnoid extension could explain this pattern.  CT Angio:  IMPRESSION: CTA neck:   1. The common carotid and internal carotid arteries are patent within the neck without stenosis. Mild atherosclerotic plaque within the bilateral carotid systems within the neck, as described. 2. Vertebral arteries patent within the neck. 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 3. 2 cm right parotid gland mass,  not significantly changed in size as compared to the prior CTA head/neck of 06/16/2017. 4. 10 mm cutaneous/subcutaneous lesion within the right cheek soft tissues (overlying the anterior right parotid gland), increased in size. Direct visualization recommended (with direct tissue sampling, as warranted). 5.  Aortic Atherosclerosis (ICD10-I70.0).   CTA head:   1. No intracranial large vessel occlusion or proximal high-grade arterial stenosis. 2. 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 This is remote from the site of subarachnoid and parenchymal hemorrhage. 3. Similar to the prior CTA, there are sites of up to severe stenosis within the P2 left PCA.   MR Brain: WO Contrast:  IMPRESSION: Localized acute infarction at the right posterior frontal vertex with a 6 mm associated intraparenchymal hemorrhage. This hemorrhage has undergone subarachnoid extension and there is subarachnoid blood in the sulci of the right sylvian fissure and posterior frontal region.   Chronic small-vessel ischemic changes elsewhere throughout the brain as outlined above, many associated with punctate hemosiderin deposition related to old microhemorrhage.  Neurology and Intervention Radiology Consulted  On 03/09/2021 he underwent 4 vessel cerebral arteriogram by Dr Estanislado Pandy.   Joseph Ortega was admitted to inpatient rehabilitation on  on 03/17/2021 and discharged home on 04/18/2021. He is receiving [facility] Health Therapy from [facility]. He denies any pain at this time . He rated his pain 3 on Health and History. Also reports he has a good appetite.   He arrived in wheelchair        Pain Inventory Average Pain 4 Pain  Right Now 3 My pain is intermittent, sharp, and stabbing  LOCATION OF PAIN  back, left knee, joints  BOWEL Number of stools per week: 4-3 Oral laxative use Yes  Type of laxative OTC   Enema or suppository use No   History of colostomy No  Incontinent No   BLADDER Normal   Mobility walk with assistance use a walker ability to climb steps?  no do you drive?  no use a wheelchair transfers alone Do you have any goals in this area?  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  yes  Neuro/Psych weakness numbness trouble walking depression  Prior Studies Any changes since last visit?  no  Physicians involved in your care Any changes since last visit?  no   History reviewed. No pertinent family history. Social History   Socioeconomic History   Marital status: Married    Spouse name: Not on file   Number of children: Not on file   Years of education: Not on file   Highest education level: Not on file  Occupational History   Not on file  Tobacco Use   Smoking status: Never   Smokeless tobacco: Never  Vaping Use   Vaping Use: Never used  Substance and Sexual Activity   Alcohol use: No   Drug use: No   Sexual activity: Not on file  Other Topics Concern   Not on file  Social History Narrative   Not on file   Social Determinants of Health   Financial Resource Strain: Not on file  Food Insecurity: Not on file  Transportation Needs: Not on file  Physical Activity: Not on file  Stress: Not on file  Social Connections: Not on file   Past Surgical History:  Procedure Laterality Date   HERNIA REPAIR     IR ANGIO INTRA EXTRACRAN SEL COM CAROTID INNOMINATE BILAT MOD SED  03/09/2021   IR ANGIO VERTEBRAL SEL VERTEBRAL BILAT MOD SED  03/09/2021   IR US GUIDE VASC ACCESS RIGHT  03/09/2021   Past Medical History:  Diagnosis Date   Diabetes mellitus without complication (HCC)    Hypertension    Thyroid disease    BP 118/70    Pulse 69    Ht 6\' 1"  (1.854 m)    Wt 268 lb (121.6 kg)     SpO2 95%    BMI 35.36 kg/m   Opioid Risk Score:   Fall Risk Score:  `1  Depression screen PHQ 2/9  Depression screen PHQ 2/9 05/09/2021  Decreased Interest 1  Down, Depressed, Hopeless 1  PHQ - 2 Score 2  Altered sleeping 1  Tired, decreased energy 1  Change in appetite 1  Feeling bad or failure about yourself  1  Trouble concentrating 0  Moving slowly or fidgety/restless 0  Suicidal thoughts 0  PHQ-9 Score 6    Review of Systems  Eyes:  Positive for visual disturbance.  Cardiovascular:  Positive for leg swelling.  Musculoskeletal:  Positive for arthralgias, back pain, gait problem and neck stiffness.  Neurological:  Positive for weakness and numbness.  Psychiatric/Behavioral:         Depression  All other systems reviewed and are negative.     Objective:   Physical Exam Vitals and nursing note reviewed.  Constitutional:      Appearance: Normal  appearance.  Cardiovascular:     Rate and Rhythm: Normal rate and regular rhythm.     Pulses: Normal pulses.     Heart sounds: Normal heart sounds.  Pulmonary:     Effort: Pulmonary effort is normal.     Breath sounds: Normal breath sounds.  Musculoskeletal:     Cervical back: Normal range of motion and neck supple.     Comments: Normal Muscle Bulk and Muscle Testing Reveals:  Upper Extremities: Right Upper Extremity: Full ROM and Muscle Strength 5/5 Left Upper Extremity: Decreased ROM 45 Degrees and Muscle Strength 1/5  Lower Extremities: Right Lower Extremity: Full ROM and Muscle Strength 5/5 Left Lower Extremity: Decreased ROM and Muscle Strength 4/5 Arrived in wheelchair      Skin:    General: Skin is warm and dry.  Neurological:     Mental Status: He is alert and oriented to person, place, and time.  Psychiatric:        Mood and Affect: Mood normal.        Behavior: Behavior normal.         Assessment & Plan:  1, Intracerebral Hemorrhage: He underwent 4 vessel cerebral arteriogram. He has a scheduled  appointment with neurology.  2. Essential Hypertension: Continue current medication regimen. PCP Following. Continue to monitor.  3.Uncontrolled Type 2 DM with Hyperglycemia.Continue current medication regimen. Joseph Ortega will make an appointment with his endocrinologist. Continue to monitor.   F/U in 4- 6 weeks with Dr Naaman Plummer

## 2021-05-11 ENCOUNTER — Other Ambulatory Visit: Payer: Self-pay | Admitting: Physician Assistant

## 2021-05-11 ENCOUNTER — Encounter: Payer: Self-pay | Admitting: Registered Nurse

## 2021-05-11 NOTE — Telephone Encounter (Signed)
This was sent from the pharmacy. These were given to him at discharge from the hospital. Would you like to refill?

## 2021-05-11 NOTE — Telephone Encounter (Signed)
This was sent from the pharmacy. They were given at discharge from the hospital. Would you like to refill?

## 2021-05-11 NOTE — Telephone Encounter (Signed)
This was sent from the pharmacy. It was given to him at discharge from the hospital. Would you like to refill?

## 2021-05-12 ENCOUNTER — Telehealth: Payer: Self-pay | Admitting: Registered Nurse

## 2021-05-12 MED ORDER — CARVEDILOL 12.5 MG PO TABS: 12.5000 mg | ORAL_TABLET | Freq: Two times a day (BID) | ORAL | 0 refills | Status: DC

## 2021-05-12 MED ORDER — MIRTAZAPINE 15 MG PO TABS: 7.5000 mg | ORAL_TABLET | Freq: Every day | ORAL | 0 refills | Status: DC

## 2021-05-12 NOTE — Telephone Encounter (Signed)
Discharge summary Reviewed.  Placed a call to [name], he states he is taking his medication as instructed on his discharge. He was asked to call his PCP to scheduled HFU appointment, he verbalizes understanding.   Placed a call to [name] to inquire how many potassium tablets he has, awaiting a return call.

## 2021-05-12 NOTE — Telephone Encounter (Signed)
Call Mr. Amer, and ask if he seen his PCP.

## 2021-05-14 NOTE — Progress Notes (Signed)
CARDIOLOGY CONSULT NOTE  ? ? ? ? ? ?Patient ID: ?Joseph Ortega ?MRN: [id number] ?DOB/AGE: 1955-07-29 66 y.o. ? ?Referring Physician: Luiz Iron ?Primary Physician: Andreas Blower., MD ?Primary Cardiologist: New ?Reason for Consultation: Stroke ? ? ?HPI:  89 y.o. referred by Dr Luiz Iron for stroke. History of poorly controlled HTN and DM. 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 ?TTE 03/11/21 reviewed EF 40-45% moderate LVH grade 2 diastolic dysfunction small to moderate pericardial effusion Bubble study negative AV sclerosis  ? ?BP meds include coreg, Diovan cardura Hydrodiuril On statin LDL 106 prior to statin A1c 11.1 03/10/21  ? ?Says he saw a cardiologist in [place] 3 years ago with normal stress test and " said my heart is fine" ? 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 No palpitations or history of PAF and none noted during hospitalization  ? ?ROS ?All other systems reviewed and negative except as noted above ? ?Past Medical History:  ?Diagnosis Date  ? Diabetes mellitus without complication (HCC)   ? Hypertension   ? Thyroid disease   ?  ?History reviewed. No pertinent family history.  ?Social History  ? ?Socioeconomic History  ? Marital status: Married  ?  Spouse name: Not on file  ? Number of children: Not on file  ? Years of education: Not on file  ? Highest education level: Not on file  ?Occupational History  ? Not on file  ?Tobacco Use  ? Smoking status: Never  ? Smokeless tobacco: Never  ?Vaping Use  ? Vaping Use: Never used  ?Substance and Sexual Activity  ? Alcohol use: No  ? Drug use: No  ? Sexual activity: Not on file  ?Other Topics Concern  ? Not on file  ?Social History Narrative  ? Not on file  ? ?Social  Determinants of Health  ? ?Financial Resource Strain: Not on file  ?Food Insecurity: Not on file  ?Transportation Needs: Not on file  ?Physical Activity: Not on file  ?Stress: Not on file  ?Social Connections: Not on file  ?Intimate Partner Violence: Not on file  ?  ?Past Surgical History:  ?Procedure Laterality Date  ? HERNIA REPAIR    ? IR ANGIO INTRA EXTRACRAN SEL COM CAROTID INNOMINATE BILAT MOD SED  03/09/2021  ? IR ANGIO VERTEBRAL SEL VERTEBRAL BILAT MOD SED  03/09/2021  ? IR US GUIDE VASC ACCESS RIGHT  03/09/2021  ?  ? ? ?Current Outpatient Medications:  ?  acetaminophen (TYLENOL) 325 MG tablet, Take 2 tablets (650 mg total) by mouth every 6 (six) hours as needed for mild pain or headache., Disp: , Rfl:  ?  aspirin EC 81 MG EC tablet, Take 1 tablet (81 mg total) by mouth daily. Swallow whole., Disp: 30 tablet, Rfl: 11 ?  atorvastatin (LIPITOR) 40 MG tablet, TAKE 1 TABLET BY MOUTH EVERY DAY, Disp: 30 tablet, Rfl: 0 ?  carvedilol (COREG) 12.5 MG tablet, Take 1 tablet (12.5 mg total) by mouth 2 (two) times daily with a meal., Disp: 60 tablet, Rfl: 0 ?  dapagliflozin propanediol (FARXIGA) 10 MG TABS tablet, Take 1 tablet by mouth daily., Disp: , Rfl:  ?  diclofenac Sodium (VOLTAREN) 1 % GEL, Apply topically., Disp: , Rfl:  ?  docusate sodium (  COLACE) 100 MG capsule, Take 1 capsule (100 mg total) by mouth 2 (two) times daily as needed for mild constipation., Disp: 10 capsule, Rfl: 0 ?  doxazosin (CARDURA) 8 MG tablet, Take 1 tablet by mouth daily., Disp: , Rfl:  ?  DULoxetine (CYMBALTA) 20 MG capsule, Take 20 mg by mouth 2 (two) times daily., Disp: , Rfl:  ?  fluticasone (FLONASE) 50 MCG/ACT nasal spray, Place 1 spray into both nostrils daily., Disp: , Rfl:  ?  hydrochlorothiazide (HYDRODIURIL) 25 MG tablet, TAKE 1 TABLET (25 MG TOTAL) BY MOUTH DAILY., Disp: 30 tablet, Rfl: 0 ?  insulin detemir (LEVEMIR) 100 UNIT/ML FlexPen, Inject 39 Units into the skin 2 (two) times daily., Disp: 15 mL, Rfl: 11 ?  Insulin Pen  Needle (AURORA PEN NEEDLES) 31G X 6 MM MISC, 1 application by Does not apply route 5 (five) times daily., Disp: 150 each, Rfl: 0 ?  levothyroxine (SYNTHROID, LEVOTHROID) 125 MCG tablet, Take 125 mcg by mouth daily before breakfast., Disp: , Rfl:  ?  metoCLOPramide (REGLAN) 5 MG tablet, Take 1 tablet (5 mg total) by mouth 3 (three) times daily before meals., Disp: 90 tablet, Rfl: 0 ?  mirtazapine (REMERON) 15 MG tablet, Take 0.5 tablets (7.5 mg total) by mouth at bedtime., Disp: 30 tablet, Rfl: 0 ?  NOVOLOG FLEXPEN 100 UNIT/ML FlexPen, Inject 14 Units into the skin 3 (three) times daily with meals., Disp: 15 mL, Rfl: 11 ?  potassium chloride SA (KLOR-CON M) 20 MEQ tablet, Take 1 tablet (20 mEq total) by mouth 2 (two) times daily., Disp: 60 tablet, Rfl: 0 ?  traZODone (DESYREL) 50 MG tablet, Take by mouth., Disp: , Rfl:  ?  TRULICITY 3 MG/0.5ML SOPN, Inject 3 mg into the skin every Thursday., Disp: , Rfl:  ?  valsartan (DIOVAN) 320 MG tablet, Take 1 tablet (320 mg total) by mouth daily., Disp: 30 tablet, Rfl: 0 ?  vitamin B-12 (CYANOCOBALAMIN) 500 MCG tablet, Take 500 mcg by mouth daily., Disp: , Rfl:  ?  Vitamin D, Ergocalciferol, (DRISDOL) 50000 UNITS CAPS capsule, Take 50,000 Units by mouth every Sunday. sunday , Disp: , Rfl:  ? ? ? ?Physical Exam: ?Blood pressure 118/64, pulse 76, height 6\' 1"  (1.854 m), weight 255 lb 3.2 oz (115.8 kg), SpO2 98 %.  ? ?Affect appropriate ?Chronically ill black male  ?HEENT: field cut left eye with previous retinal surgery skewed vision  ?Neck supple with no adenopathy ?JVP normal no bruits no thyromegaly ?Lungs clear with no wheezing and good diaphragmatic motion ?Heart:  S1/S2 no murmur, no rub, gallop or click ?PMI normal ?Abdomen: benighn, BS positve, no tenderness, no AAA ?no bruit.  No HSM or HJR ?Distal pulses intact with no bruits ?Plus one edema with venous stasis changes  ?Neuro non-focal ?Skin warm and dry ?No muscular weakness ? ? ?Labs: ?  ?Lab Results  ?Component Value  Date  ? WBC 6.7 04/11/2021  ? HGB 13.8 04/11/2021  ? HCT 41.3 04/11/2021  ? MCV 93.0 04/11/2021  ? PLT 184 04/11/2021  ? No results for input(s): NA, K, CL, CO2, BUN, CREATININE, CALCIUM, PROT, BILITOT, ALKPHOS, ALT, AST, GLUCOSE in the last 168 hours. ? ?Invalid input(s): LABALBU ?Lab Results  ?Component Value Date  ? TROPONINI <0.30 03/03/2013  ?  ?Lab Results  ?Component Value Date  ? CHOL 156 03/10/2021  ? ?Lab Results  ?Component Value Date  ? HDL 36 (L) 03/10/2021  ? ?Lab Results  ?Component Value Date  ? LDLCALC  106 (H) 03/10/2021  ? ?Lab Results  ?Component Value Date  ? TRIG 72 03/10/2021  ? TRIG 70 03/10/2021  ? ?Lab Results  ?Component Value Date  ? CHOLHDL 4.3 03/10/2021  ? ?No results found for: LDLDIRECT  ?  ?Radiology: ?No results found. ? ?EKG: SR RBBB LAE ? 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 ? ?ASSESSMENT AND PLAN:  ? ?Stroke:  no obvious aneurysm. 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 IMI on Farxiga, coreg and ARB  F/U echo in 6 months consider f/u lexiscan myovue at that time  ?HTN:  Well controlled.  Continue current medications and low sodium Dash type diet.   ?DM:  Discussed low carb diet.  Target hemoglobin A1c is 6.5 or less.  Continue current medications. ?Hypothyroidism:  on synthroid replacement ?HLD:  on statin repeat Lipid  ? ?Refer EP possible loop  ?Lipids with primary  ?F/U echo 6 months ? ? ?Signed: ?Charlton Haws ?05/18/2021, 2:48 PM ? ? ?

## 2021-05-15 ENCOUNTER — Other Ambulatory Visit: Payer: Self-pay | Admitting: Physician Assistant

## 2021-05-18 ENCOUNTER — Encounter: Payer: Self-pay | Admitting: Cardiovascular Disease

## 2021-05-18 ENCOUNTER — Ambulatory Visit: Payer: BC Managed Care – PPO | Admitting: Cardiovascular Disease

## 2021-05-18 ENCOUNTER — Ambulatory Visit (INDEPENDENT_AMBULATORY_CARE_PROVIDER_SITE_OTHER): Payer: BC Managed Care – PPO

## 2021-05-18 ENCOUNTER — Other Ambulatory Visit: Payer: Self-pay

## 2021-05-18 VITALS — BP 118/64 | HR 76 | Ht 73.0 in | Wt 255.2 lb

## 2021-05-18 DIAGNOSIS — I10 Essential (primary) hypertension: Secondary | ICD-10-CM

## 2021-05-18 DIAGNOSIS — I420 Dilated cardiomyopathy: Secondary | ICD-10-CM

## 2021-05-18 DIAGNOSIS — I639 Cerebral infarction, unspecified: Secondary | ICD-10-CM

## 2021-05-18 DIAGNOSIS — I6310 Cerebral infarction due to embolism of unspecified precerebral artery: Secondary | ICD-10-CM | POA: Diagnosis not present

## 2021-05-18 NOTE — Patient Instructions (Signed)
Medication Instructions:  ?Your physician recommends that you continue on your current medications as directed. Please refer to the Current Medication list given to you today. ? ?Labwork: ?NONE ? ?Testing/Procedures: ?NONE ? ?Follow-Up: ?Your physician wants you to follow-up in: 6 months with Dr. Eden Emms. You will receive a reminder letter in the mail two months in advance. If you don't receive a letter, please call our office to schedule the follow-up appointment. ? ? ?If you need a refill on your cardiac medications before your next appointment, please call your pharmacy. ? ?You have been referred to Electrophysiology doctor to discuss loop recorder. ? ?

## 2021-05-18 NOTE — Progress Notes (Unsigned)
[id number] mailed to patient 04/11/21 applied in office 05/18/21.  New enrollment sent to [employer] since original enrollment out of date. ?

## 2021-05-30 ENCOUNTER — Other Ambulatory Visit: Payer: Self-pay | Admitting: Physician Assistant

## 2021-06-01 ENCOUNTER — Telehealth: Payer: Self-pay | Admitting: Physical Medicine & Rehabilitation

## 2021-06-01 NOTE — Telephone Encounter (Signed)
LVM Dr Naaman Plummer unable to complete [employer] since he has not seen the patient since he was discharged, advised on waitlist   ?

## 2021-06-01 NOTE — Telephone Encounter (Signed)
[name] is aware you may be unable to complete the paperwork sent in by [employer], however would it be possible to draft a letter stating he is still unable to return to work and is still under your care until his next appointment in June Thanks  ?

## 2021-06-02 ENCOUNTER — Encounter (HOSPITAL_COMMUNITY): Payer: Self-pay | Admitting: Physical Medicine & Rehabilitation

## 2021-06-02 NOTE — Progress Notes (Unsigned)
Letter written and in epic.

## 2021-06-11 ENCOUNTER — Other Ambulatory Visit: Payer: Self-pay | Admitting: Registered Nurse

## 2021-06-14 NOTE — Progress Notes (Signed)
?[facility] ?[address] ?[place]. [place] [address] ?[phone] ? ?     HOSPITAL FOLLOW UP NOTE ? ?[name] ?Date of Birth:  10/20/1955 ?Medical Record Number:  [id number]  ? ?Reason for Referral:  hospital stroke follow up ? ? ? ?SUBJECTIVE: ? ? ?CHIEF COMPLAINT:  ?Chief Complaint  ?Patient presents with  ? Follow-up  ?  RM 3 alone ?Pt is well, sates he is trying to learn to walk again. Having trouble with L extremity weakness and L eye.   ? ? ?HPI:  ? ?[name] is a 66 y.o. male with history of Type 2 diabetes, hypertension, hypothyroidism who presented on 03/08/2021 with left-sided weakness with gradual improvement of left leg although continued left arm weakness. Per Dr. Phoebe Sharps note, noted rash under left chest appearing to be shingles.  CTA head showed 4 mm intraparenchymal hematoma in right posterior frontal lobe with subarachnoid penetration, etiology unclear possibly due to arthrosclerosis given multiple uncontrolled risk factors however cardioembolic source cannot be completely ruled out.  Neurosurgery consulted , started Keppra and Cleviprex for BP goal 130-150 for 24-hour duration.  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  Cerebral angio no evidence of DAVF, AV shunting, AVM, aneurysms, dissections or vasculitis, did note right subclavian and VA origin calcified plaque, and left VA origin moderate stenosis.  MRI showed acute infarction at the right posterior frontal vertex with a 6 mm associated IPH and SAH of the right sylvian fissure and posterior frontal region.  Repeat MRI on 12/23 showed new acute infarction in the right occipital lobe and enlargement of posterior frontal infarction, stable HT and SAH.  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  Etiology for acute infarct felt to be related to large vessel arthrosclerosis given embolic calcified  plaque right P3 from right VA origin or subclavian artery.  EF 45 to 50%.  Recommended 30-day cardiac event monitor outpatient to rule out A-fib.  A1c 11.1.  LDL 106.  Initiated aspirin 81 mg daily, no DAPT given HT and SAH. CCM and ID managed shingles.  Per therapy recommendations, discharged to [facility] on 12/30 for ongoing therapy needs. ? ? ?Today, 06/15/2021, patient being seen for initial hospital follow-up unaccompanied. Continued left sided weakness with some improvement since discharge. Currently working with home health PT/OT. Able to ambulate short distance with RW otherwise will use w/c. He was completely independent prior to his stroke. He is currently not driving, received medical transportation for today's visit. 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  ? ?He does have concerns regarding his vision. Reports visual impairment- while in hospital, had fluctuation of left eye diplopia and occasional drooping of eye lid - reports shortly after leaving CIR, he woke up one morning and unable to open left eye. He can open it slightly now but just enough that he sees some light. He does endorse immediate diplopia when eyelid completely open (can only open if he pulls up on eye lid). Denies any associated pain or headache. Does still have left lower visual field loss. He is scheduled to see neuro-ophthalmology next week in [place], [place].   ? ?Compliant on aspirin and atorvastatin, denies side effects.  Blood pressure today 152/86.  Routinely monitors at home by home health therapy which has been stable. He had difficulty completing cardiac monitor as it would not stick to  his skin, he is scheduled on 4/11 with Dr. Ileana Ladd to discuss ILR placement. He has not yet seen PCP or endocrinology as they are located in [facility] and has had difficulty obtaining transportation to bring him to these visits.  No further concerns at this time. ? ? ? ? ? ?PERTINENT IMAGING/LABS ? ?Per hospitalization  03/08/2021 ?Code Stroke CT head right frontal SAH with 74mm IPH ?CTA head & neck 1-34mm infundibulum vs aneurysm arising from paraclinoid L ICA ?12/22- Cerebral angio -No angio evidence of of DAVF,AV shunting,AVM ,aneurysms or of dissections or vasculitis. Right subclavian and VA origin calcified plaque, left VA origin moderate stenosis ?MRI localized acute infarction at the right posterior frontal vertex with a 6 mm associated IPH and SAH in the right sylvian fissure and posterior frontal region ?Repeat MRI 12/23 new acute infarction in right occipital lobe and enlargement of posterior frontal infarction, stable HT and SAH ?CTA head repeat 12/24- Focal calcification within the right PCA distal P3 segment, likely calcified thromboembolism. ?CTA neck repeat 12/27 unchanged right subclavian and VA origin calcified plaque ?2D echo EF 45-50% ? ?A1C- 11.1 ?LDL- 106 ? ? ? ? ? ? ? ? ?ROS:   ?14 system review of systems performed and negative with exception of those listed in HPI ? ?PMH:  ?Past Medical History:  ?Diagnosis Date  ? Diabetes mellitus without complication ([place])   ? Hypertension   ? Thyroid disease   ? ? ?PSH:  ?Past Surgical History:  ?Procedure Laterality Date  ? HERNIA REPAIR    ? IR ANGIO INTRA EXTRACRAN SEL COM CAROTID INNOMINATE BILAT MOD SED  03/09/2021  ? IR ANGIO VERTEBRAL SEL VERTEBRAL BILAT MOD SED  03/09/2021  ? IR US GUIDE VASC ACCESS RIGHT  03/09/2021  ? ? ?Social History:  ?Social History  ? ?Socioeconomic History  ? Marital status: Married  ?  Spouse name: Not on file  ? Number of children: Not on file  ? Years of education: Not on file  ? Highest education level: Not on file  ?Occupational History  ? Not on file  ?Tobacco Use  ? Smoking status: Never  ? Smokeless tobacco: Never  ?Vaping Use  ? Vaping Use: Never used  ?Substance and Sexual Activity  ? Alcohol use: No  ? Drug use: No  ? Sexual activity: Not on file  ?Other Topics Concern  ? Not on file  ?Social History Narrative  ? Not on file   ? ?Social Determinants of Health  ? ?Financial Resource Strain: Not on file  ?Food Insecurity: Not on file  ?Transportation Needs: Not on file  ?Physical Activity: Not on file  ?Stress: Not on file  ?Social Connections: Not on file  ?Intimate Partner Violence: Not on file  ? ? ?Family History: History reviewed. No pertinent family history. ? ?Medications:   ?Current Outpatient Medications on File Prior to Visit  ?Medication Sig Dispense Refill  ? acetaminophen (TYLENOL) 325 MG tablet Take 2 tablets (650 mg total) by mouth every 6 (six) hours as needed for mild pain or headache.    ? aspirin EC 81 MG EC tablet Take 1 tablet (81 mg total) by mouth daily. Swallow whole. 30 tablet 11  ? atorvastatin (LIPITOR) 40 MG tablet TAKE 1 TABLET BY MOUTH EVERY DAY 30 tablet 0  ? carvedilol (COREG) 12.5 MG tablet Take 1 tablet (12.5 mg total) by mouth 2 (two) times daily with a meal. 60 tablet 0  ? dapagliflozin propanediol (FARXIGA) 10 MG TABS  tablet Take 1 tablet by mouth daily.    ? diclofenac Sodium (VOLTAREN) 1 % GEL Apply topically.    ? docusate sodium (COLACE) 100 MG capsule Take 1 capsule (100 mg total) by mouth 2 (two) times daily as needed for mild constipation. 10 capsule 0  ? doxazosin (CARDURA) 8 MG tablet Take 1 tablet by mouth daily.    ? DULoxetine (CYMBALTA) 20 MG capsule Take 20 mg by mouth 2 (two) times daily.    ? fluticasone (FLONASE) 50 MCG/ACT nasal spray Place 1 spray into both nostrils daily.    ? hydrochlorothiazide (HYDRODIURIL) 25 MG tablet TAKE 1 TABLET (25 MG TOTAL) BY MOUTH DAILY. 30 tablet 0  ? insulin detemir (LEVEMIR) 100 UNIT/ML FlexPen Inject 39 Units into the skin 2 (two) times daily. 15 mL 11  ? Insulin Pen Needle (AURORA PEN NEEDLES) 31G X 6 MM MISC 1 application by Does not apply route 5 (five) times daily. 150 each 0  ? levothyroxine (SYNTHROID, LEVOTHROID) 125 MCG tablet Take 125 mcg by mouth daily before breakfast.    ? metoCLOPramide (REGLAN) 5 MG tablet Take 1 tablet (5 mg total) by  mouth 3 (three) times daily before meals. 90 tablet 0  ? mirtazapine (REMERON) 15 MG tablet Take 0.5 tablets (7.5 mg total) by mouth at bedtime. 30 tablet 0  ? NOVOLOG FLEXPEN 100 UNIT/ML FlexPen Inject 14 Units in

## 2021-06-15 ENCOUNTER — Encounter: Payer: Self-pay | Admitting: Adult Health

## 2021-06-15 ENCOUNTER — Ambulatory Visit: Payer: BC Managed Care – PPO | Admitting: Adult Health

## 2021-06-15 VITALS — BP 152/86 | HR 73 | Ht 73.0 in

## 2021-06-15 DIAGNOSIS — I609 Nontraumatic subarachnoid hemorrhage, unspecified: Secondary | ICD-10-CM

## 2021-06-15 DIAGNOSIS — I639 Cerebral infarction, unspecified: Secondary | ICD-10-CM | POA: Diagnosis not present

## 2021-06-15 DIAGNOSIS — E1165 Type 2 diabetes mellitus with hyperglycemia: Secondary | ICD-10-CM

## 2021-06-15 DIAGNOSIS — H4902 Third [oculomotor] nerve palsy, left eye: Secondary | ICD-10-CM | POA: Diagnosis not present

## 2021-06-15 DIAGNOSIS — Z09 Encounter for follow-up examination after completed treatment for conditions other than malignant neoplasm: Secondary | ICD-10-CM

## 2021-06-15 DIAGNOSIS — E785 Hyperlipidemia, unspecified: Secondary | ICD-10-CM | POA: Diagnosis not present

## 2021-06-15 NOTE — Patient Instructions (Addendum)
Recommend repeating an MRI of your brain and CT scan of your head vessels - you will be called to schedule these ? ?Continue working with therapies for hopeful ongoing recovery  ? ?Ensure follow up with neuro-ophthalmology next week - please have them send the office notes to [place] after this visit  ? ?Continue aspirin 81 mg daily  and atorvastatin 40mg  daily  for secondary stroke prevention ? ?Continue to follow up with PCP regarding cholesterol, blood pressure and diabetes management  ?Maintain strict control of hypertension with blood pressure goal below 130/90, diabetes with hemoglobin A1c goal below 7.0 % and cholesterol with LDL cholesterol (bad cholesterol) goal below 70 mg/dL.  ? ?Signs of a Stroke? Follow the BEFAST method:  ?Balance Watch for a sudden loss of balance, trouble with coordination or vertigo ?Eyes Is there a sudden loss of vision in one or both eyes? Or double vision?  ?Face: Ask the person to smile. Does one side of the face droop or is it numb?  ?Arms: Ask the person to raise both arms. Does one arm drift downward? Is there weakness or numbness of a leg? ?Speech: Ask the person to repeat a simple phrase. Does the speech sound slurred/strange? Is the person confused ? ?Time: If you observe any of these signs, call 911. ? ? ? ? ?Followup in the future with me in 3-4 months or call earlier if needed ? ? ? ? ? ? ?Thank you for coming to see at [facility]. I hope we have been able to provide you high quality care today. ? ?You may receive a patient satisfaction survey over the next few weeks. We would appreciate your feedback and comments so that we may continue to improve ourselves and the health of our patients. ? ? ? ?

## 2021-06-16 ENCOUNTER — Telehealth: Payer: Self-pay | Admitting: Adult Health

## 2021-06-16 LAB — COMPREHENSIVE METABOLIC PANEL
ALT: 14 IU/L (ref 0–44)
AST: 20 IU/L (ref 0–40)
Albumin/Globulin Ratio: 1.4 (ref 1.2–2.2)
Albumin: 4.2 g/dL (ref 3.8–4.8)
Alkaline Phosphatase: 219 IU/L — ABNORMAL HIGH (ref 44–121)
BUN/Creatinine Ratio: 28 — ABNORMAL HIGH (ref 10–24)
BUN: 21 mg/dL (ref 8–27)
Bilirubin Total: 0.4 mg/dL (ref 0.0–1.2)
CO2: 27 mmol/L (ref 20–29)
Calcium: 9.6 mg/dL (ref 8.6–10.2)
Chloride: 103 mmol/L (ref 96–106)
Creatinine, Ser: 0.75 mg/dL — ABNORMAL LOW (ref 0.76–1.27)
Globulin, Total: 3.1 g/dL (ref 1.5–4.5)
Glucose: 290 mg/dL — ABNORMAL HIGH (ref 70–99)
Potassium: 4.2 mmol/L (ref 3.5–5.2)
Sodium: 143 mmol/L (ref 134–144)
Total Protein: 7.3 g/dL (ref 6.0–8.5)
eGFR: 101 mL/min/{1.73_m2} (ref >59)

## 2021-06-16 LAB — LIPID PANEL
Chol/HDL Ratio: 2.6 ratio (ref 0.0–5.0)
Cholesterol, Total: 128 mg/dL (ref 100–199)
HDL: 49 mg/dL (ref >39)
LDL Chol Calc (NIH): 57 mg/dL (ref 0–99)
Triglycerides: 127 mg/dL (ref 0–149)
VLDL Cholesterol Cal: 22 mg/dL (ref 5–40)

## 2021-06-16 LAB — HEMOGLOBIN A1C
Est. average glucose Bld gHb Est-mCnc: 177 mg/dL
Hgb A1c MFr Bld: 7.8 % — ABNORMAL HIGH (ref 4.8–5.6)

## 2021-06-16 NOTE — Telephone Encounter (Signed)
[employer] Berkley Harvey: [id number] (exp. 06/16/21 to 07/15/21) order sent to GI, they will reach out to the patient to schedule.  ?

## 2021-06-16 NOTE — Progress Notes (Signed)
I agree with the above plan 

## 2021-06-20 ENCOUNTER — Telehealth: Payer: Self-pay | Admitting: *Deleted

## 2021-06-20 NOTE — Telephone Encounter (Signed)
Mr Feig called and asked for a new referral to another neuro ophthalmologist in [place]. The one he was to see tomorrow doesn't accept [facility] transportation and he has no way to get there. Hoping someone in [place] will. ?

## 2021-06-27 ENCOUNTER — Encounter: Payer: Self-pay | Admitting: Cardiology

## 2021-06-27 ENCOUNTER — Ambulatory Visit: Payer: BC Managed Care – PPO | Admitting: Cardiology

## 2021-06-27 ENCOUNTER — Encounter: Payer: Self-pay | Admitting: *Deleted

## 2021-06-27 VITALS — BP 140/68 | HR 65 | Ht 73.0 in | Wt 250.0 lb

## 2021-06-27 DIAGNOSIS — I639 Cerebral infarction, unspecified: Secondary | ICD-10-CM

## 2021-06-27 NOTE — Patient Instructions (Addendum)
Medication Instructions:  ?Your physician recommends that you continue on your current medications as directed. Please refer to the Current Medication list given to you today. ?*If you need a refill on your cardiac medications before your next appointment, please call your pharmacy* ? ?Lab Work: ?None. ?If you have labs (blood work) drawn today and your tests are completely normal, you will receive your results only by: ?MyChart Message (if you have MyChart) OR ?A paper copy in the mail ?If you have any lab test that is abnormal or we need to change your treatment, we will call you to review the results. ? ?Testing/Procedures: ?None. ? ?Follow-Up: ?At [facility], you and your health needs are our priority.  As part of our continuing mission to provide you with exceptional heart care, we have created designated Provider Care Teams.  These Care Teams include your primary Cardiologist (physician) and Advanced Practice Providers (APPs -  Physician Assistants and Nurse Practitioners) who all work together to provide you with the care you need, when you need it. ? ?Your physician wants you to follow-up in: please call the office if you wish to more forward with the loop implant.  ? ?We recommend signing up for the patient portal called "MyChart".  Sign up information is provided on this After Visit Summary.  MyChart is used to connect with patients for Virtual Visits (Telemedicine).  Patients are able to view lab/test results, encounter notes, upcoming appointments, etc.  Non-urgent messages can be sent to your provider as well.   ?To learn more about what you can do with MyChart, go to [email or web address].   ? ?Any Other Special Instructions Will Be Listed Below (If Applicable). ? ?Implantable Loop Recorder Placement ?An implantable loop recorder is a small electronic device that is placed under the skin of your chest. The device records the electrical activity of your heart over a long period of time. Your  health care provider can download these recordings to monitor your heart. ?You may need an implantable loop recorder if you have periods of abnormal heart activity (arrhythmias) or unexplained fainting (syncope). The recorder can be left in place for 1 year or longer. ?Tell a health care provider about: ?Any allergies you have. ?All medicines you are taking, including vitamins, herbs, eye drops, creams, and over-the-counter medicines. ?Any problems you or family members have had with anesthetic medicines. ?Any bleeding problems you have. ?Any surgeries you have had. ?Any medical conditions you have. ?Whether you are pregnant or may be pregnant. ?What are the risks? ?Generally, this is a safe procedure. However, problems may occur, including: ?Infection. ?Bleeding. ?Allergic reactions to anesthetic medicines. ?Damage to nerves or blood vessels. ?Failure of the device to work. This could require another surgery to replace it. ?What happens before the procedure? ? ?You may have a physical exam, blood tests, and imaging tests of your heart, such as a chest X-ray. ?Follow instructions from your health care provider about eating or drinking restrictions. ?Ask your health care provider about: ?Changing or stopping your regular medicines. This is especially important if you are taking diabetes medicines or blood thinners. ?Taking medicines such as aspirin and ibuprofen. These medicines can thin your blood. Do not take these medicines unless your health care provider tells you to take them. ?Taking over-the-counter medicines, vitamins, herbs, and supplements. ?Ask your health care provider how your surgical site will be marked or identified. ?Ask your health care provider what steps will be taken to help prevent infection. These may include: ?Removing  hair at the surgery site. ?Washing skin with a germ-killing soap. ?Plan to have someone take you home from the hospital or clinic. ?Plan to have a responsible adult care for  you for at least 24 hours after you leave the hospital or clinic. This is important. ?Do not use any products that contain nicotine or tobacco, such as cigarettes and e-cigarettes. If you need help quitting, ask your health care provider. ?What happens during the procedure? ?An IV will be inserted into one of your veins. ?You may be given one or more of the following: ?A medicine to help you relax (sedative). ?A medicine to numb the area (local anesthetic). ?A small incision will be made on the left side of your upper chest. ?A pocket will be created under your skin. ?The device will be placed in the pocket. ?The incision will be closed with stitches (sutures) or adhesive strips. ?A bandage (dressing) will be placed over the incision. ?The procedure may vary among health care providers and hospitals. ?What happens after the procedure? ?Your blood pressure, heart rate, breathing rate, and blood oxygen level will be monitored until you leave the hospital or clinic. ?You may be able to go home on the day of your surgery. Before you go home: ?Your health care provider will program your recorder. ?You will learn how to trigger your device with a handheld activator. ?You will learn how to send recordings to your health care provider. ?You will get an ID card for your device, and you will be told when to use it. ?Do not drive for 24 hours if you were given a sedative during your procedure. ?Summary ?An implantable loop recorder is a small electronic device that is placed under the skin of your chest to monitor your heart over a long period of time. ?The recorder can be left in place for 1 year or longer. ?Plan to have someone take you home from the hospital or clinic. ?This information is not intended to replace advice given to you by your health care provider. Make sure you discuss any questions you have with your health care provider. ?Document Revised: 07/05/2020 Document Reviewed: 07/05/2020 ?Elsevier Patient Education  ? 2022 Elsevier Inc. ? ? ? ?  ? ? ?

## 2021-06-27 NOTE — Progress Notes (Signed)
? ?Electrophysiology Office Note ? ? ?Date:  06/27/2021  ? ?IDTravien Ortega, DOB January 16, 1956, MRN [id number] ? ?PCP:  Kristopher Glee., MD  ?Cardiologist:  Johnsie Cancel ?Primary Electrophysiologist:  Lynzie Cliburn Meredith Leeds, MD   ? ?Chief Complaint: CVA ?  ?History of Present Illness: ?Joseph Ortega is a 66 y.o. male who is being seen today for the evaluation of CVA at the request of Josue Hector, MD. Presenting today for electrophysiology evaluation. ? ?He has a history significant hypertension and diabetes.  He was admitted to the hospital with right frontal infarct with small hemorrhage.  He also had a new right PCA infarct.  He wore a cardiac monitor that showed no evidence of atrial fibrillation.  He has been working with rehab.  He is now able to walk with a walker.  He is hopeful to get to a cane to ambulate.  He is continue to have weakness though on his left side.  He is having difficulty opening his left eye. ? ?Today, he denies symptoms of palpitations, chest pain, shortness of breath, orthopnea, PND, lower extremity edema, claudication, dizziness, presyncope, syncope, bleeding, or neurologic sequela. The patient is tolerating medications without difficulties.  ? ? ?Past Medical History:  ?Diagnosis Date  ? Diabetes mellitus without complication ([place])   ? Hypertension   ? Thyroid disease   ? ?Past Surgical History:  ?Procedure Laterality Date  ? HERNIA REPAIR    ? IR ANGIO INTRA EXTRACRAN SEL COM CAROTID INNOMINATE BILAT MOD SED  03/09/2021  ? IR ANGIO VERTEBRAL SEL VERTEBRAL BILAT MOD SED  03/09/2021  ? IR US GUIDE VASC ACCESS RIGHT  03/09/2021  ? ? ? ?Current Outpatient Medications  ?Medication Sig Dispense Refill  ? acetaminophen (TYLENOL) 325 MG tablet Take 2 tablets (650 mg total) by mouth every 6 (six) hours as needed for mild pain or headache.    ? aspirin EC 81 MG EC tablet Take 1 tablet (81 mg total) by mouth daily. Swallow whole. 30 tablet 11  ? atorvastatin (LIPITOR) 40 MG tablet TAKE 1 TABLET BY MOUTH  EVERY DAY 30 tablet 0  ? carvedilol (COREG) 12.5 MG tablet Take 1 tablet (12.5 mg total) by mouth 2 (two) times daily with a meal. 60 tablet 0  ? dapagliflozin propanediol (FARXIGA) 10 MG TABS tablet Take 1 tablet by mouth daily.    ? diclofenac Sodium (VOLTAREN) 1 % GEL Apply topically.    ? docusate sodium (COLACE) 100 MG capsule Take 1 capsule (100 mg total) by mouth 2 (two) times daily as needed for mild constipation. 10 capsule 0  ? doxazosin (CARDURA) 8 MG tablet Take 1 tablet by mouth daily.    ? DULoxetine (CYMBALTA) 20 MG capsule Take 20 mg by mouth 2 (two) times daily.    ? fluticasone (FLONASE) 50 MCG/ACT nasal spray Place 1 spray into both nostrils daily.    ? hydrochlorothiazide (HYDRODIURIL) 25 MG tablet TAKE 1 TABLET (25 MG TOTAL) BY MOUTH DAILY. 30 tablet 0  ? insulin detemir (LEVEMIR) 100 UNIT/ML FlexPen Inject 39 Units into the skin 2 (two) times daily. 15 mL 11  ? Insulin Pen Needle (AURORA PEN NEEDLES) 31G X 6 MM MISC 1 application by Does not apply route 5 (five) times daily. 150 each 0  ? levothyroxine (SYNTHROID, LEVOTHROID) 125 MCG tablet Take 125 mcg by mouth daily before breakfast.    ? metoCLOPramide (REGLAN) 5 MG tablet Take 1 tablet (5 mg total) by mouth 3 (three) times daily before  meals. 90 tablet 0  ? mirtazapine (REMERON) 15 MG tablet Take 0.5 tablets (7.5 mg total) by mouth at bedtime. 30 tablet 0  ? NOVOLOG FLEXPEN 100 UNIT/ML FlexPen Inject 14 Units into the skin 3 (three) times daily with meals. 15 mL 11  ? potassium chloride SA (KLOR-CON M) 20 MEQ tablet Take 1 tablet (20 mEq total) by mouth 2 (two) times daily. 60 tablet 0  ? traZODone (DESYREL) 50 MG tablet TAKE 1 TABLET BY MOUTH EVERYDAY AT BEDTIME 30 tablet 3  ? TRULICITY 3 0000000 SOPN Inject 3 mg into the skin every Thursday.    ? valsartan (DIOVAN) 320 MG tablet Take 1 tablet (320 mg total) by mouth daily. 30 tablet 0  ? vitamin B-12 (CYANOCOBALAMIN) 500 MCG tablet Take 500 mcg by mouth daily.    ? Vitamin D,  Ergocalciferol, (DRISDOL) 50000 UNITS CAPS capsule Take 50,000 Units by mouth every Sunday. sunday     ? ?No current facility-administered medications for this visit.  ? ? ?Allergies:   Patient has no known allergies.  ? ?Social History:  The patient  reports that he has never smoked. He has never used smokeless tobacco. He reports that he does not drink alcohol and does not use drugs.  ? ?Family History:  The patient's family history includes Diabetes in his father.  ? ? ?ROS:  Please see the history of present illness.   Otherwise, review of systems is positive for none.   All other systems are reviewed and negative.  ? ? ?PHYSICAL EXAM: ?VS:  BP 140/68   Pulse 65   Ht 6\' 1"  (1.854 m)   Wt 250 lb (113.4 kg)   BMI 32.98 kg/m?  , BMI Body mass index is 32.98 kg/m?. ?GEN: Well nourished, well developed, in no acute distress  ?HEENT: normal  ?Neck: no JVD, carotid bruits, or masses ?Cardiac: RRR; no murmurs, rubs, or gallops,no edema  ?Respiratory:  clear to auscultation bilaterally, normal work of breathing ?GI: soft, nontender, nondistended, + BS ?MS: no deformity or atrophy  ?Skin: warm and dry ?Neuro: Left-sided weakness, left-sided eye droop ?Psych: euthymic mood, full affect ? ?EKG:  EKG is ordered today. ?Personal review of the ekg ordered shows sinus rhythm, rate 65, right bundle branch block ? ?Recent Labs: ?03/08/2021: Magnesium 2.2; TSH 9.736 ?04/11/2021: Hemoglobin 13.8; Platelets 184 ?06/15/2021: ALT 14; BUN 21; Creatinine, Ser 0.75; Potassium 4.2; Sodium 143  ? ? ?Lipid Panel  ?   ?Component Value Date/Time  ? CHOL 128 06/15/2021 1052  ? TRIG 127 06/15/2021 1052  ? HDL 49 06/15/2021 1052  ? CHOLHDL 2.6 06/15/2021 1052  ? CHOLHDL 4.3 03/10/2021 0326  ? VLDL 14 03/10/2021 0326  ? [place] 57 06/15/2021 1052  ? ? ? ?Wt Readings from Last 3 Encounters:  ?06/27/21 250 lb (113.4 kg)  ?05/18/21 255 lb 3.2 oz (115.8 kg)  ?05/09/21 268 lb (121.6 kg)  ?  ? ? ?Other studies Reviewed: ?Additional studies/ records  that were reviewed today include: TTE 03/11/21  ?Review of the above records today demonstrates:  ? 1. Left ventricular ejection fraction, by estimation, is 45 to 50%. The  ?left ventricle has mildly decreased function. The left ventricle  ?demonstrates regional wall motion abnormalities (see scoring  ?diagram/findings for description). There is moderate  ?concentric left ventricular hypertrophy. Left ventricular diastolic  ?parameters are consistent with Grade II diastolic dysfunction  ?(pseudonormalization). No LV mural thrombus.  ? 2. Right ventricular systolic function is normal. The right ventricular  ?  size is normal. Tricuspid regurgitation signal is inadequate for assessing  ?PA pressure.  ? 3. A small to moderate pericardial effusion is present. The pericardial  ?effusion is circumferential. There is no evidence of cardiac tamponade.  ? 4. The mitral valve is grossly normal. Trivial mitral valve  ?regurgitation.  ? 5. The aortic valve is tricuspid. Aortic valve regurgitation is not  ?visualized. Aortic valve sclerosis is present, with no evidence of aortic  ?valve stenosis. Aortic valve mean gradient measures 3.0 mmHg.  ? 6. The inferior vena cava is dilated in size with <50% respiratory  ?variability, suggesting right atrial pressure of 15 mmHg.  ? 7. Agitated saline contrast bubble study was negative, with no evidence  ?of any interatrial shunt. ? ?Cardiac monitor 06/22/2021 personally reviewed ?NSR ?No significant arrhythmia ?No PAF ?No AV block  ? ?ASSESSMENT AND PLAN: ? ?1.  Cryptogenic stroke: No obvious aneurysm.  No atrial fibrillation noted on cardiac monitoring.  He would thus benefit from Linq monitoring.  Risk and benefits were discussed include bleeding and infection.  He understands these risks.  He has been improving from his stroke.  He would like to discuss Linq monitor implant further with his wife and Kameran Lallier call us back with an answer. ? ? ? ?Current medicines are reviewed at length with the  patient today.   ?The patient does not have concerns regarding his medicines.  The following changes were made today:  none ? ?Labs/ tests ordered today include:  ?Orders Placed This Encounter  ?Procedures  ? EKG 12-

## 2021-06-27 NOTE — Progress Notes (Signed)
Patient ID: Joseph Ortega, male   DOB: 07-15-1955, 66 y.o.   MRN: [id number] ?Patient dropped [employer] cardiac event monitor serial # [id number] off at office.  We will return to [employer] in box with prepaid UPS shipping label. ?

## 2021-06-30 ENCOUNTER — Other Ambulatory Visit: Payer: Self-pay | Admitting: Physician Assistant

## 2021-06-30 ENCOUNTER — Other Ambulatory Visit: Payer: Self-pay | Admitting: Registered Nurse

## 2021-07-01 ENCOUNTER — Other Ambulatory Visit: Payer: Self-pay | Admitting: Physical Medicine & Rehabilitation

## 2021-07-11 ENCOUNTER — Ambulatory Visit
Admission: RE | Admit: 2021-07-11 | Discharge: 2021-07-11 | Disposition: A | Discharge status: Home / Self Care | Payer: BC Managed Care – PPO | Source: Ambulatory Visit | Attending: Adult Health | Admitting: Adult Health

## 2021-07-11 DIAGNOSIS — I639 Cerebral infarction, unspecified: Secondary | ICD-10-CM | POA: Diagnosis not present

## 2021-07-11 DIAGNOSIS — I609 Nontraumatic subarachnoid hemorrhage, unspecified: Secondary | ICD-10-CM | POA: Diagnosis not present

## 2021-07-11 MED ORDER — GADOBENATE DIMEGLUMINE 529 MG/ML IV SOLN
20.0000 mL | Freq: Once | INTRAVENOUS | Status: AC | PRN
  Administered 2021-07-11: 20 mL via INTRAVENOUS

## 2021-07-12 ENCOUNTER — Other Ambulatory Visit: Payer: Self-pay | Admitting: Physician Assistant

## 2021-07-16 ENCOUNTER — Other Ambulatory Visit: Payer: Self-pay | Admitting: Registered Nurse

## 2021-08-16 ENCOUNTER — Other Ambulatory Visit: Payer: Self-pay | Admitting: Registered Nurse

## 2021-08-16 ENCOUNTER — Other Ambulatory Visit: Payer: Self-pay | Admitting: Physical Medicine & Rehabilitation

## 2021-08-16 ENCOUNTER — Other Ambulatory Visit: Payer: Self-pay | Admitting: Physician Assistant

## 2021-08-23 ENCOUNTER — Encounter
Payer: BC Managed Care – PPO | Attending: Physical Medicine & Rehabilitation | Admitting: Physical Medicine & Rehabilitation

## 2021-08-23 ENCOUNTER — Encounter: Payer: Self-pay | Admitting: Physical Medicine & Rehabilitation

## 2021-08-23 VITALS — BP 156/77 | HR 55 | Ht 73.0 in | Wt 258.0 lb

## 2021-08-23 DIAGNOSIS — I609 Nontraumatic subarachnoid hemorrhage, unspecified: Secondary | ICD-10-CM | POA: Insufficient documentation

## 2021-08-23 DIAGNOSIS — G529 Cranial nerve disorder, unspecified: Secondary | ICD-10-CM | POA: Insufficient documentation

## 2021-08-23 NOTE — Patient Instructions (Addendum)
PLEASE FEEL FREE TO CALL OUR OFFICE WITH ANY PROBLEMS OR QUESTIONS [phone])   ?BIONESS FOR YOUR LEFT ARM/ SHOULDER??

## 2021-08-23 NOTE — Progress Notes (Signed)
Subjective:    Patient ID: Joseph Ortega, male    DOB: [birth year], 66 y.o.   MRN: [id number]  HPI  Joseph Ortega is here in follow up of his right frontal infarct/hemorrhage. He's independent with his walker at home. He's using a hemiwalker as well. Therapy got him a shoulder harness for his shoulder subluxation. He is having mild pain in the shoulder. They have done some estim on the extremity in therapy.   Bowels and bladder are intact. Mood has been positive for the most part and he's sleeping well.   He has had some issues with his vision in his left eye.  He has noticed blurred and double vision.  He woke up with the symptoms 1 morning about 2 months after his stroke.  He recalls having had Bell's palsy in the past.  He has seen some improvement in the vision but has yet to see an eye specialist.  He reports his diabetes is under control.   Pain Inventory Average Pain 3 Pain Right Now 3 My pain is sharp and stabbing  LOCATION OF PAIN  neck shoulder wrist hand  BOWEL Number of stools per week: 4  BLADDER Normal  Mobility walk with assistance use a walker ability to climb steps?  yes do you drive?  no  Function I need assistance with the following:  bathing, meal prep, household duties, and shopping  Neuro/Psych weakness numbness tremor tingling trouble walking dizziness depression anxiety  Prior Studies Any changes since last visit?  no  Physicians involved in your care Any changes since last visit?  no   Family History  Problem Relation Age of Onset   Diabetes Father    Social History   Socioeconomic History   Marital status: Married    Spouse name: Not on file   Number of children: Not on file   Years of education: Not on file   Highest education level: Not on file  Occupational History   Not on file  Tobacco Use   Smoking status: Never   Smokeless tobacco: Never  Vaping Use   Vaping Use: Never used  Substance and Sexual Activity   Alcohol use:  No   Drug use: No   Sexual activity: Not on file  Other Topics Concern   Not on file  Social History Narrative   Not on file   Social Determinants of Health   Financial Resource Strain: Not on file  Food Insecurity: Not on file  Transportation Needs: Not on file  Physical Activity: Not on file  Stress: Not on file  Social Connections: Not on file   Past Surgical History:  Procedure Laterality Date   HERNIA REPAIR     IR ANGIO INTRA EXTRACRAN SEL COM CAROTID INNOMINATE BILAT MOD SED  03/09/2021   IR ANGIO VERTEBRAL SEL VERTEBRAL BILAT MOD SED  03/09/2021   IR US GUIDE VASC ACCESS RIGHT  03/09/2021   Past Medical History:  Diagnosis Date   Diabetes mellitus without complication ([facility])    Hypertension    Thyroid disease    BP (!) 156/77   Pulse (!) 55   Ht 6\' 1"  (1.854 m)   Wt 258 lb (117 kg)   SpO2 96%   BMI 34.04 kg/m   Opioid Risk Score:   Fall Risk Score:  `1  Depression screen [facility] 2/9     08/23/2021    1:27 PM 06/15/2021    4:06 PM 05/09/2021   12:44 PM  Depression screen PHQ 2/9  Decreased Interest 1 0 1  Down, Depressed, Hopeless 1 0 1  PHQ - 2 Score 2 0 2  Altered sleeping   1  Tired, decreased energy   1  Change in appetite   1  Feeling bad or failure about yourself    1  Trouble concentrating   0  Moving slowly or fidgety/restless   0  Suicidal thoughts   0  PHQ-9 Score   6     Review of Systems  Constitutional: Negative.   HENT: Negative.    Eyes: Negative.   Respiratory: Negative.    Cardiovascular: Negative.   Gastrointestinal: Negative.   Endocrine: Negative.   Genitourinary: Negative.   Musculoskeletal:  Positive for gait problem.  Skin: Negative.   Allergic/Immunologic: Negative.   Neurological:  Positive for tremors, weakness and numbness.       Tingling  Hematological: Negative.   Psychiatric/Behavioral:  Positive for dysphoric mood. The patient is nervous/anxious.   All other systems reviewed and are negative.     Objective:    Physical Exam Gen: no distress, normal appearing HEENT: oral mucosa pink and moist, NCAT Cardio: Reg rate Chest: normal effort, normal rate of breathing Abd: soft, non-distended Ext: 1+ LLE edema Psych: pleasant, normal affect Skin: intact, scattered wounds left arm and leg.  Neuro: LUE 1-2/5 prox to 2+/5 distal. LLE 3- to 3/5 prox to 1-2/5 distally ADF. Sensaion 1/2 on left. Struggles with conjugate gaze OS. Left eye lags much more in abduction. Normal cognition. No abnl tone Musculoskeletal: oa of both knees. Swelling left leg        Assessment & Plan:  1. 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 ?embolic. Pt also with acute right PCA infarct.                -continue with outpt therapies  -might do well with qad cane as opposed to hemiwalker  - 2. Antiplatelet therapy: aspirin 81 mg 3. 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 DM-insulin requiring:   Hgb A1c = 11.1               5.  Peripheral neuropathy: continue gabapentin 300 mg twice daily at home 6.  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 Aura Camps for assessment    Fifteen minutes of face to face patient care time were spent during this visit. All questions were encouraged and answered.  Follow up with me in 3 mos .

## 2021-09-26 ENCOUNTER — Telehealth: Payer: Self-pay | Admitting: Adult Health

## 2021-09-26 NOTE — Telephone Encounter (Signed)
Pt called and stated he need for someone to call " [employer]" and confirm his appointment for tomorrow, so he will have a ride here. Please call Raynelle Fanning @ [phone] to confirm his appointment.

## 2021-09-26 NOTE — Telephone Encounter (Signed)
Called Raynelle Fanning who asked for our cost center # so that we could be billed to pay for patient's transportation here. I advised her I am not aware that we pay for that, will have to ask our billing manager. She stated she will call patient and advise him they can't transport him here.  Angie, billing manager stated patient can ask his insurance if they will pay. Called patient and LVM, informed him he needs to call his insurance company to see if they'll cover transpo. Also reminded him of correct appointment date/ time.

## 2021-09-27 ENCOUNTER — Ambulatory Visit: Payer: BC Managed Care – PPO | Admitting: Adult Health

## 2021-09-28 ENCOUNTER — Ambulatory Visit: Payer: Self-pay | Admitting: Adult Health

## 2021-09-28 ENCOUNTER — Ambulatory Visit (INDEPENDENT_AMBULATORY_CARE_PROVIDER_SITE_OTHER): Payer: BC Managed Care – PPO | Admitting: Adult Health

## 2021-09-28 VITALS — Ht 73.0 in

## 2021-09-28 DIAGNOSIS — Z5321 Procedure and treatment not carried out due to patient leaving prior to being seen by health care provider: Secondary | ICD-10-CM

## 2021-09-28 NOTE — Progress Notes (Signed)
Patient scheduled today for in office routine stroke follow-up visit.  He was brought to visit via medical transport.  Upon presenting to the office, he started to feel lightheaded and became diaphoretic which he reported was his normal hypoglycemia symptoms.  He was provided juice and crackers, felt a little better but symptoms did not completely resolve even after 20 minutes. Advised emergency room evaluation as symptoms persisted but he declined. He was provided with more juice and crackers and sat for another 10-15 minutes. He still felt unwell, was still diaphoretic and flushed. Our office does not have POCT glucose. He did not check his glucose levels this morning but denies having issues with hypoglycemia routinely.  Blood pressure and HR WNL. Again, highly recommended being evaluated in ED but he continued to decline. He wished to return home as he has supplies to help with hypoglycemia. He does live with his wife who is currently at home. He was advised importance of calling 911 immediately if hypoglycemia persists once returns home -he verbalized understanding.  Patient contacted transport company to pick him up. We will reschedule today's visit via MyChart video visit due to transportation difficulties.

## 2021-10-02 ENCOUNTER — Telehealth: Payer: BC Managed Care – PPO | Admitting: Adult Health

## 2021-10-02 NOTE — Progress Notes (Deleted)
[facility] [address] [place]. [place] [address] [phone]       STROKE FOLLOW UP NOTE  Joseph Ortega Date of Birth:  Feb 02, 1956 Medical Record Number:  [id number]   Reason for Referral: \stroke follow up    SUBJECTIVE:   CHIEF COMPLAINT:  No chief complaint on file.   HPI:   Update 10/02/2021 JM: Patient returns for stroke follow-up via [facility] video visit.  He was previously scheduled last week, 7/13, but had hypoglycemic episode (see note dated 7/13) therefore visit rescheduled for MyChart video visit due to transportation difficulties.    Overall stable from stroke standpoint without new stroke/TIA symptoms.  Reports residual left-sided weakness ***.  Therapies ***.  Ambulation ***. 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  Reports continued left eye diplopia and drooping of eye lid - completed MR brain 05/2021 which was unremarkable for new findings.  Has not yet completed CTA head/neck. Missesd prior scheduled visit with neuro-ophthalmology, recently referred back to neuro-ophthalmology by Dr. Naaman Ortega ***.   Compliant on aspirin atorvastatin, denies side effects.  Routinely followed by home health nurse who checks blood pressure and has been stable per patient.  Routinely followed by endocrinology for DM management.  Was seen by Dr. Curt Ortega for ILR placement but he declined placement at that visit and wished to further consider ***.      History provided for reference purposes only Update 06/15/2021 JM: patient being seen for initial hospital follow-up unaccompanied. Continued left sided weakness with some improvement since discharge. Currently working with home health PT/OT. Able to ambulate short distance with RW otherwise will use w/c. He was completely independent prior to his stroke. He is currently not driving, received medical transportation for today's visit. 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   He does have concerns regarding his vision. Reports visual impairment- while in hospital, had fluctuation of left eye diplopia and occasional drooping of eye lid - reports shortly after leaving CIR, he woke up one morning and unable to open left eye. He can open it slightly now but just enough that he sees some light. He does endorse immediate diplopia when eyelid completely open (can only open if he pulls up on eye lid). Denies any associated pain or headache. Does still have left lower visual field loss. He is scheduled to see neuro-ophthalmology next week in [place], [place].    Compliant on aspirin and atorvastatin, denies side effects.  Blood pressure today 152/86.  Routinely monitors at home by home health therapy which has been stable. He had difficulty completing cardiac monitor as it would not stick to his skin, he is scheduled on 4/11 with Dr. Ileana Ortega to discuss ILR placement. He has not yet seen PCP or endocrinology as they are located in [facility] and has had difficulty obtaining transportation to bring him to these visits.  No further concerns at this time.   Stroke admission 03/08/2021 [name] is a 66 y.o. male with history of Type 2 diabetes, hypertension, hypothyroidism who presented on 03/08/2021 with left-sided weakness with gradual improvement of left leg although continued left arm weakness. Per Dr. Phoebe Ortega note, noted rash under left chest appearing to be shingles.  CTA head showed 4 mm intraparenchymal hematoma in right posterior frontal lobe with subarachnoid penetration, etiology unclear possibly due to arthrosclerosis given multiple uncontrolled risk factors however cardioembolic source cannot be completely ruled out.  Neurosurgery  consulted , started Keppra and Cleviprex for BP goal 130-150 for 24-hour duration.  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  Cerebral angio no evidence of DAVF, AV shunting, AVM,  aneurysms, dissections or vasculitis, did note right subclavian and VA origin calcified plaque, and left VA origin moderate stenosis.  MRI showed acute infarction at the right posterior frontal vertex with a 6 mm associated IPH and SAH of the right sylvian fissure and posterior frontal region.  Repeat MRI on 12/23 showed new acute infarction in the right occipital lobe and enlargement of posterior frontal infarction, stable HT and SAH.  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  Etiology for acute infarct felt to be related to large vessel arthrosclerosis given embolic calcified plaque right P3 from right VA origin or subclavian artery.  EF 45 to 50%.  Recommended 30-day cardiac event monitor outpatient to rule out A-fib.  A1c 11.1.  LDL 106.  Initiated aspirin 81 mg daily, no DAPT given HT and SAH. CCM and ID managed shingles.  Per therapy recommendations, discharged to CIR on 12/30 for ongoing therapy needs.       PERTINENT IMAGING/LABS  Per hospitalization 03/08/2021 Code Stroke CT head right frontal SAH with 56mm IPH CTA head & neck 1-102mm infundibulum vs aneurysm arising from paraclinoid L ICA 12/22- Cerebral angio -No angio evidence of of DAVF,AV shunting,AVM ,aneurysms or of dissections or vasculitis. Right subclavian and VA origin calcified plaque, left VA origin moderate stenosis MRI localized acute infarction at the right posterior frontal vertex with a 6 mm associated IPH and SAH in the right sylvian fissure and posterior frontal region Repeat MRI 12/23 new acute infarction in right occipital lobe and enlargement of posterior frontal infarction, stable HT and SAH CTA head repeat 12/24- Focal calcification within the right PCA distal P3 segment, likely calcified thromboembolism. CTA neck repeat 12/27 unchanged right subclavian and VA origin calcified plaque 2D echo EF 45-50%  A1C- 11.1 LDL- 106         ROS:   14  system review of systems performed and negative with exception of those listed in HPI  PMH:  Past Medical History:  Diagnosis Date   Diabetes mellitus without complication (HCC)    Hypertension    Thyroid disease     PSH:  Past Surgical History:  Procedure Laterality Date   HERNIA REPAIR     IR ANGIO INTRA EXTRACRAN SEL COM CAROTID INNOMINATE BILAT MOD SED  03/09/2021   IR ANGIO VERTEBRAL SEL VERTEBRAL BILAT MOD SED  03/09/2021   IR US GUIDE VASC ACCESS RIGHT  03/09/2021    Social History:  Social History   Socioeconomic History   Marital status: Married    Spouse name: Not on file   Number of children: Not on file   Years of education: Not on file   Highest education level: Not on file  Occupational History   Not on file  Tobacco Use   Smoking status: Never   Smokeless tobacco: Never  Vaping Use   Vaping Use: Never used  Substance and Sexual Activity   Alcohol use: No   Drug use: No   Sexual activity: Not on file  Other Topics Concern   Not on file  Social History Narrative   Not on file   Social Determinants of Health   Financial Resource Strain: Not on file  Food Insecurity: Not on file  Transportation Needs: Not on file  Physical Activity: Not on file  Stress: Not on file  Social Connections: Not on file  Intimate Partner Violence: Not on file    Family History:  Family History  Problem Relation Age of Onset   Diabetes Father     Medications:   Current Outpatient Medications on File Prior to Visit  Medication Sig Dispense Refill   acetaminophen (TYLENOL) 325 MG tablet Take 2 tablets (650 mg total) by mouth every 6 (six) hours as needed for mild pain or headache.     aspirin EC 81 MG EC tablet Take 1 tablet (81 mg total) by mouth daily. Swallow whole. 30 tablet 11   atorvastatin (LIPITOR) 40 MG tablet TAKE 1 TABLET BY MOUTH EVERY DAY 30 tablet 0   carvedilol (COREG) 12.5 MG tablet Take 1 tablet (12.5 mg total) by mouth 2 (two) times daily with a  meal. 60 tablet 0   dapagliflozin propanediol (FARXIGA) 10 MG TABS tablet Take 1 tablet by mouth daily.     diclofenac Sodium (VOLTAREN) 1 % GEL Apply topically.     docusate sodium (COLACE) 100 MG capsule Take 1 capsule (100 mg total) by mouth 2 (two) times daily as needed for mild constipation. 10 capsule 0   doxazosin (CARDURA) 8 MG tablet Take 1 tablet by mouth daily.     DULoxetine (CYMBALTA) 20 MG capsule Take 20 mg by mouth 2 (two) times daily.     fluticasone (FLONASE) 50 MCG/ACT nasal spray Place 1 spray into both nostrils daily.     hydrochlorothiazide (HYDRODIURIL) 25 MG tablet TAKE 1 TABLET (25 MG TOTAL) BY MOUTH DAILY. 30 tablet 0   insulin detemir (LEVEMIR) 100 UNIT/ML FlexPen Inject 39 Units into the skin 2 (two) times daily. 15 mL 11   Insulin Pen Needle (AURORA PEN NEEDLES) 31G X 6 MM MISC 1 application by Does not apply route 5 (five) times daily. 150 each 0   levothyroxine (SYNTHROID, LEVOTHROID) 125 MCG tablet Take 125 mcg by mouth daily before breakfast.     metoCLOPramide (REGLAN) 5 MG tablet Take 1 tablet (5 mg total) by mouth 3 (three) times daily before meals. 90 tablet 0   mirtazapine (REMERON) 15 MG tablet Take 0.5 tablets (7.5 mg total) by mouth at bedtime. 30 tablet 0   NOVOLOG FLEXPEN 100 UNIT/ML FlexPen Inject 14 Units into the skin 3 (three) times daily with meals. 15 mL 11   potassium chloride SA (KLOR-CON M) 20 MEQ tablet Take 1 tablet (20 mEq total) by mouth 2 (two) times daily. 60 tablet 0   traZODone (DESYREL) 50 MG tablet TAKE 1 TABLET BY MOUTH EVERYDAY AT BEDTIME 90 tablet 1   TRULICITY 3 MG/0.5ML SOPN Inject 3 mg into the skin every Thursday.     valsartan (DIOVAN) 320 MG tablet Take 1 tablet (320 mg total) by mouth daily. 30 tablet 0   vitamin B-12 (CYANOCOBALAMIN) 500 MCG tablet Take 500 mcg by mouth daily.     Vitamin D, Ergocalciferol, (DRISDOL) 50000 UNITS CAPS capsule Take 50,000 Units by mouth every Sunday. sunday      No current  facility-administered medications on file prior to visit.    Allergies:  No Known Allergies    OBJECTIVE:  Physical Exam  There were no vitals filed for this visit.  There is no height or weight on file to calculate BMI. No results found.  General: well developed, well nourished, very pleasant middle-age Caucasian male, seated, in no evident distress  Head: head normocephalic and atraumatic.    Neurologic Exam Mental Status: Awake and fully alert. Oriented to place and time. Recent and remote memory intact. Attention span, concentration and fund of knowledge appropriate. Mood and affect appropriate.  Cranial Nerves: Extraocular movements full without nystagmus. Hearing intact to voice. Facial sensation intact. Face, tongue, palate moves normally and symmetrically.  Shoulder shrug symmetric. Motor: No evidence of weakness per drift assessment Sensory.: intact to light touch Coordination: Rapid alternating movements normal in all extremities. Finger-to-nose and heel-to-shin performed accurately bilaterally. Gait and Station: Arises from chair without difficulty. Stance is normal. Gait demonstrates normal stride length and balance .  Reflexes: UTA    Neurologic Exam Mental Status: Awake and fully alert. Oriented to place and time. Recent and remote memory intact. Attention span, concentration and fund of knowledge appropriate. Mood and affect appropriate.  Cranial Nerves: Pupils equal bilaterally, OD reacts briskly to light, OS sluggish to light but does respond.  Extraocular movements full OD, limited movement OS vertical and horizontal (also limited d/t complaints of diplopia just with eyelid open). Left eye lid ptosis.  Visual fields in right eye left lower visual loss, unable to adequately eval left eye.  Hearing intact. Facial sensation intact. Unable to appreciate other facial weakness except left eyelid ptosis. Face, tongue, palate moves normally and symmetrically.  Motor: Normal  strength, bulk and tone right upper and lower extremity LUE: 3+/5 proximal, 4-/5 elbow extension and flexion, 2/5 grip although increased swelling noted in hand LLE: 3/4 HF, 4/5 KE and KF, 2-3/5 ADF and 4/5 APF Sensory.: intact to touch , pinprick , position and vibratory sensation.  Coordination: Rapid alternating movements normal on right side. Finger-to-nose and heel-to-shin performed accurately on right side. Gait and Station: Deferred as RW not present during visit Reflexes: 1+ and symmetric. Toes downgoing.            ASSESSMENT: Joseph Ortega is a 66 y.o. year old male with right frontal infarct with small HT and SAH on 03/08/2021 secondary to unclear source possibly arthrosclerosis given multiple uncontrolled risk factors however cardioembolic source cannot be completely ruled out and on 12/23 evidence of acute right PCA infarct etiology possibly large vessel arthrosclerosis given embolic calcified plaque of right P3 from R VA origin or subclavian artery. Vascular risk factors include HTN, HLD, DM, bilateral VA stenosis, obesity and OSA on CPAP.      PLAN:  Strokes as above :  Residual deficit: Left hemiparesis, left lower visual field deficit and gait impairment.  Continue home health therapies and likely transition to outpatient therapies once completed. Has evaluation with Dr. Elberta Fortis 4/11 for possible ILR placement Continue aspirin 81 mg daily  and atorvastatin 40 mg daily for secondary stroke prevention.   Discussed secondary stroke prevention measures and importance of close PCP follow up for aggressive stroke risk factor management including BP goal<130/90, HLD with LDL goal<70 and DM with A1c.<7 Stroke labs: A1c 8.0 (08/2021), LDL 57 (05/2021)  I have gone over the pathophysiology of stroke, warning signs and symptoms, risk factors and their management in some detail with instructions to go to the closest emergency room for symptoms of concern. 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   Fluctuation of symptoms since 02/2021 with persistent symptoms since Jan/Feb 2023 MR brain w/wo 05/2021 no acute or new findings since prior imaging. No findings contributing to symptoms Not yet completed CTA head/neck - advised to contact GI to  schedule Re-referred back to neuro-ophthalmology by Dr. Naaman Ortega - advised to contact Dr. Frederico Hamman ***     Follow up in 3-4 months or call earlier if needed   CC:  PCP: [facility] [place] spent 59 minutes of face-to-face and non-face-to-face time with patient via MyChart video visit.  This included previsit chart review, lab review, study review, order entry, electronic health record documentation, patient education and discussion regarding prior stroke with residual deficits, secondary stroke prevention measures and importance of managing stroke risk factors, cranial nerve palsy and possible etiologies and answered all other questions to patient satisfaction   Frann Rider, AGNP-BC  [facility] [address] [place], [place] [address]  Phone [phone] Fax [phone] Note: This document was prepared with digital dictation and possible smart phrase technology. Any transcriptional errors that result from this process are unintentional.

## 2021-12-06 ENCOUNTER — Encounter
Payer: BC Managed Care – PPO | Attending: Physical Medicine & Rehabilitation | Admitting: Physical Medicine & Rehabilitation

## 2021-12-06 ENCOUNTER — Encounter: Payer: Self-pay | Admitting: Physical Medicine & Rehabilitation

## 2021-12-06 VITALS — BP 117/61 | HR 56 | Ht 73.0 in | Wt 261.8 lb

## 2021-12-06 DIAGNOSIS — I611 Nontraumatic intracerebral hemorrhage in hemisphere, cortical: Secondary | ICD-10-CM | POA: Insufficient documentation

## 2021-12-06 NOTE — Progress Notes (Signed)
Subjective:    Patient ID: Joseph Ortega, male    DOB: 1955/07/09, 66 y.o.   MRN: [id number]  HPI  Joseph Ortega is here in follow up of his right frontal infarct with hemorrhage. He has been struggling with anxiety and depression. He's a lot more irritable around his wife. He has been trying to stay active at home and help with chores.   He saw a psychiatrist who put him on cymbalta as well as something for anxiety. He found the visit very helpful. He also has been talking with his children which has been uplifting.   His sugars are labile. His primary is working on glycemic control. Some changes were made recently. He admits to not staying as active.   Bowels and bladder are intact. Sleep is fair.   Pain Inventory Average Pain 3 Pain Right Now 3 My pain is aching  LOCATION OF PAIN  neck, shoulder, back, knee  BOWEL Number of stools per week: 5 Oral laxative use Yes    BLADDER Normal    Mobility use a walker how many minutes can you walk? 5 Do you have any goals in this area?  yes  Function disabled: date disabled . I need assistance with the following:  meal prep and household duties  Neuro/Psych weakness numbness tingling depression anxiety  Prior Studies Any changes since last visit?  no  Physicians involved in your care Any changes since last visit?  no   Family History  Problem Relation Age of Onset   Diabetes Father    Social History   Socioeconomic History   Marital status: Married    Spouse name: Not on file   Number of children: Not on file   Years of education: Not on file   Highest education level: Not on file  Occupational History   Not on file  Tobacco Use   Smoking status: Never   Smokeless tobacco: Never  Vaping Use   Vaping Use: Never used  Substance and Sexual Activity   Alcohol use: No   Drug use: No   Sexual activity: Not on file  Other Topics Concern   Not on file  Social History Narrative   Not on file   Social  Determinants of Health   Financial Resource Strain: Not on file  Food Insecurity: Not on file  Transportation Needs: Not on file  Physical Activity: Not on file  Stress: Not on file  Social Connections: Not on file   Past Surgical History:  Procedure Laterality Date   HERNIA REPAIR     IR ANGIO INTRA EXTRACRAN SEL COM CAROTID INNOMINATE BILAT MOD SED  03/09/2021   IR ANGIO VERTEBRAL SEL VERTEBRAL BILAT MOD SED  03/09/2021   IR US GUIDE VASC ACCESS RIGHT  03/09/2021   Past Medical History:  Diagnosis Date   Diabetes mellitus without complication (HCC)    Hypertension    Thyroid disease    BP 117/61   Pulse (!) 56   Ht 6\' 1"  (1.854 m)   Wt 261 lb 12.8 oz (118.8 kg)   SpO2 97%   BMI 34.54 kg/m   Opioid Risk Score:   Fall Risk Score:  `1  Depression screen PHQ 2/9     12/06/2021    1:14 PM 08/23/2021    1:27 PM 06/15/2021    4:06 PM 05/09/2021   12:44 PM  Depression screen PHQ 2/9  Decreased Interest 3 1 0 1  Down, Depressed, Hopeless 3 1 0 1  PHQ -  2 Score 6 2 0 2  Altered sleeping    1  Tired, decreased energy    1  Change in appetite    1  Feeling bad or failure about yourself     1  Trouble concentrating    0  Moving slowly or fidgety/restless    0  Suicidal thoughts    0  PHQ-9 Score    6     Review of Systems  Constitutional: Negative.   HENT: Negative.    Eyes: Negative.   Cardiovascular: Negative.   Gastrointestinal: Negative.   Endocrine: Negative.   Genitourinary: Negative.   Musculoskeletal:  Positive for back pain, gait problem and neck pain.  Skin: Negative.   Allergic/Immunologic: Negative.   Neurological:  Positive for weakness and numbness.  Hematological: Negative.   Psychiatric/Behavioral: Negative.        Objective:   Physical Exam General: No acute distress HEENT: NCAT, EOMI, oral membranes moist Cards: reg rate  Chest: normal effort Abdomen: Soft, NT, ND Skin: dry, intact Extremities: warm Psych: pleasant and appropriate   Skin: intact, scattered wounds left arm and leg.  Neuro: LUE 3+/5 prox to 2/5 distal. LLE 3+/5 prox to 3-/5 distally ADF. Sensaion 1/2 on left. Gaze improved.   Normal cognition. No abnl tone. Shuffles with LLE during gait due to edema and redeuced ankle motion.  Musculoskeletal: oa of both knees. Swelling left leg 2++           Assessment & Plan:  1. 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 ?embolic. Pt also with acute right PCA infarct.                -continue with HH therapies--->outpt therapies next             -using RW curently. Also uses quad cane             -MAY RETURN TO DRIVING WITH PLAN I PROVIDED 2. Antiplatelet therapy: aspirin 81 mg 3. 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 DM-insulin requiring:   Hgb A1c = 11.1              -primary working on better control.  5.  Peripheral neuropathy: continue gabapentin 300 mg twice daily at home 6.  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 Gevena Cotton for assessment  -sx are improving 7. Reactive depression   -cymbalta per primary  8. Edema: compression stockings/ ACE wrap   15 minutes of face to face patient care time were spent during this visit. All questions were encouraged and answered.  Follow up with me in 4 mos .

## 2021-12-06 NOTE — Patient Instructions (Signed)
LET ME KNOW WHEN YOU'RE READY FOR OUTPT THERAPIES.   CONSIDER ACE WRAPPING YOUR LEFT LEG VS  ZIPPER COMPRESSION STOCKINGS VS  VELCRO COMPRESSION STOCKINGS   RETURN TO DRIVING PLAN:  WITH THE SUPERVISION OF A LICENSED DRIVER, PLEASE DRIVE IN AN EMPTY PARKING LOT FOR AT LEAST 2-3 TRIALS TO TEST REACTION TIME, VISION, USE OF EQUIPMENT IN CAR, ETC.  IF SUCCESSFUL WITH THE PARKING LOT DRIVING, PROCEED TO SUPERVISED DRIVING TRIALS IN YOUR NEIGHBORHOOD STREETS AT LOW TRAFFIC TIMES TO TEST OBSERVATION TO TRAFFIC SIGNALS, REACTION TIME, ETC. PLEASE ATTEMPT AT LEAST 2-3 TRIALS IN YOUR NEIGHBORHOOD.  IF NEIGHBORHOOD DRIVING IS SUCCESSFUL, YOU MAY PROCEED TO DRIVING IN BUSIER AREAS IN YOUR COMMUNITY WITH SUPERVISION OF A LICENSED DRIVER. PLEASE ATTEMPT AT LEAST 4-5 TRIALS.  IF COMMUNITY DRIVING IS SUCCESSFUL, YOU MAY PROCEED TO DRIVING ALONE, DURING THE DAY TIME, IN NON-PEAK TRAFFIC TIMES. YOU SHOULD DRIVE NO FURTHER THAN 20 MINUTES IN ONE DIRECTION. PLEASE DO NOT DRIVE IF YOU FEEL FATIGUED OR UNDER THE INFLUENCE OF MEDICATION.

## 2022-01-12 ENCOUNTER — Telehealth: Payer: Self-pay | Admitting: Physical Medicine & Rehabilitation

## 2022-01-12 DIAGNOSIS — I611 Nontraumatic intracerebral hemorrhage in hemisphere, cortical: Secondary | ICD-10-CM

## 2022-01-12 NOTE — Telephone Encounter (Signed)
I spoke with Joseph Ortega because typically [facility] calls for the POC and not the patient. He says they came out to do the start of care and then the therapist went on vacation and did not put the plan in resulting in him being discharged. I have called [facility] and asked them to call back to explain the situation. We should not have to put in a new referral/ or at least they should have called [place] and not the patient. Await their call back.

## 2022-01-12 NOTE — Telephone Encounter (Signed)
Needs a referral for home OT and PT. He has been receiving those services and needs to continue.  Please send referral to [facility]

## 2022-01-18 NOTE — Telephone Encounter (Signed)
I spoke with Joseph Ortega again today since we never got a call from [facility] explaining why they closed his case. He said he just called them and they once again said they never received another referral for his therapy (see previous message). He has to travel on the bus (?scat) but at this point is he considered still homebound or should he just be referred to outpt therapy?

## 2022-01-19 NOTE — Telephone Encounter (Signed)
The therapist finally called to day and said his new ins copay will be about $50-120 per visit per service, so he would benefit from outpt therapy which will be better covered.

## 2022-01-19 NOTE — Telephone Encounter (Signed)
Referrals to neuro rehab PT, OT made.  thx

## 2022-01-22 NOTE — Telephone Encounter (Signed)
Notified patient by leaving message on VM per DPR.

## 2022-02-19 ENCOUNTER — Ambulatory Visit: Payer: BC Managed Care – PPO | Admitting: Occupational Therapy

## 2022-02-19 ENCOUNTER — Ambulatory Visit: Payer: BC Managed Care – PPO | Admitting: Physical Therapy

## 2022-02-28 ENCOUNTER — Encounter: Payer: Self-pay | Admitting: Physical Medicine & Rehabilitation

## 2022-02-28 ENCOUNTER — Encounter
Payer: BC Managed Care – PPO | Attending: Physical Medicine & Rehabilitation | Admitting: Physical Medicine & Rehabilitation

## 2022-02-28 VITALS — BP 183/84 | HR 65 | Ht 73.0 in

## 2022-02-28 DIAGNOSIS — I611 Nontraumatic intracerebral hemorrhage in hemisphere, cortical: Secondary | ICD-10-CM | POA: Diagnosis present

## 2022-02-28 NOTE — Progress Notes (Signed)
Subjective:    Patient ID: Joseph Ortega, male    DOB: [birth year], 66 y.o.   MRN: [id number]  HPI  Mr. Delanna Ahmadi is here in follow up of his right frontal infarct. He has been active with his HEP. He does a lot of stretching and work with his left arm. He has noticed some improvement in his strength on the left side. He walks regularly with his walker. He bought a new walker which he has found helpful given it's storage capacity.   He has not really driven yet since we talked. He is a little anxious about driving in traffic.  Vision has improved. He's close to baseline now with his glasses.   Mood has been positive. He remains on cymbalta.    Pain Inventory Average Pain 2 Pain Right Now 2 My pain is intermittent and throb  LOCATION OF PAIN  neck, back, shoulder  BOWEL Number of stools per week: 2 Oral laxative use Yes  Type of laxative OTC  BLADDER Normal    Mobility use a walker how many minutes can you walk? 2-3 minutes ability to climb steps?  yes do you drive?  no  Function disabled: date disabled 2022  Neuro/Psych weakness numbness tingling trouble walking  Prior Studies Any changes since last visit?  no  Physicians involved in your care Any changes since last visit?  yes Primary care Dr. Thompson [facility]er at [facility] History  Problem Relation Age of Onset   Diabetes Father    Social History   Socioeconomic History   Marital status: Married    Spouse name: Not on file   Number of children: Not on file   Years of education: Not on file   Highest education level: Not on file  Occupational History   Not on file  Tobacco Use   Smoking status: Never   Smokeless tobacco: Never  Vaping Use   Vaping Use: Never used  Substance and Sexual Activity   Alcohol use: No   Drug use: No   Sexual activity: Not on file  Other Topics Concern   Not on file  Social History Narrative   Not on file   Social Determinants of Health   Financial  Resource Strain: Not on file  Food Insecurity: Not on file  Transportation Needs: Not on file  Physical Activity: Not on file  Stress: Not on file  Social Connections: Not on file   Past Surgical History:  Procedure Laterality Date   HERNIA REPAIR     IR ANGIO INTRA EXTRACRAN SEL COM CAROTID INNOMINATE BILAT MOD SED  03/09/2021   IR ANGIO VERTEBRAL SEL VERTEBRAL BILAT MOD SED  03/09/2021   IR US GUIDE VASC ACCESS RIGHT  03/09/2021   Past Medical History:  Diagnosis Date   Diabetes mellitus without complication (HCC)    Hypertension    Thyroid disease    Ht 6\' 1"  (1.854 m)   BMI 34.54 kg/m   Opioid Risk Score:   Fall Risk Score:  `1  Depression screen [facility] 2/9     02/28/2022    9:21 AM 12/06/2021    1:14 PM 08/23/2021    1:27 PM 06/15/2021    4:06 PM 05/09/2021   12:44 PM  Depression screen PHQ 2/9  Decreased Interest 0 3 1 0 1  Down, Depressed, Hopeless 0 3 1 0 1  PHQ - 2 Score 0 6 2 0 2  Altered sleeping     1  Tired, decreased  energy     1  Change in appetite     1  Feeling bad or failure about yourself      1  Trouble concentrating     0  Moving slowly or fidgety/restless     0  Suicidal thoughts     0  PHQ-9 Score     6    Review of Systems  Musculoskeletal:  Positive for gait problem.  Neurological:  Positive for weakness and numbness.       Tingling  All other systems reviewed and are negative.      Objective:   Physical Exam General: No acute distress. obese HEENT: NCAT, EOMI, oral membranes moist Cards: reg rate  Chest: normal effort Abdomen: Soft, NT, ND Skin: dry, intact Extremities: no edema Psych: pleasant and appropriate  Skin: intact, scattered wounds left arm and leg.  Neuro: LUE 3+ to 4-/5 prox to 2/5 distal. LLE 3+ to 4-/5 prox to 3-/5 distally ADF. Sensaion 1/2 on left. Gaze improved.   Normal cognition. No abnl tone. Gait improved but still doesn't land on left heel during gait, more flat footed.  Musculoskeletal: oa of both knees.  Swelling left leg 1+           Assessment & Plan:  1. 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 ?embolic. Pt also with acute right PCA infarct.                - outpt therapies. Asked therapies if he would be a candidate for VIVISTIM              -using RW curently. Also uses quad cane             -MAY RETURN TO DRIVING WITH PLAN I PREVIOUSLY PROVIDED 2. Antiplatelet therapy: aspirin 81 mg 3. 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 DM-insulin requiring:   Hgb A1c = 11.1              -primary following  5.  Peripheral neuropathy: continue gabapentin 300 mg twice daily at home 6.  Diplopia:             -Questionable cranial nerve VI injury on the left              -close to baseline 7. Reactive depression              -cymbalta per primary. In a good place currently   8. Edema: compression stockings/ ACE wrap     15 minutes of face to face patient care time were spent during this visit. All questions were encouraged and answered.  Follow up with me in 4 mos .

## 2022-02-28 NOTE — Patient Instructions (Signed)
ALWAYS FEEL FREE TO CALL OUR OFFICE WITH ANY PROBLEMS OR QUESTIONS [phone])  **PLEASE NOTE** ALL MEDICATION REFILL REQUESTS (INCLUDING CONTROLLED SUBSTANCES) NEED TO BE MADE AT LEAST 7 DAYS PRIOR TO REFILL BEING DUE. ANY REFILL REQUESTS INSIDE THAT TIME FRAME MAY RESULT IN DELAYS IN RECEIVING YOUR PRESCRIPTION.                    PROVIDE MY NAME/ADDRESS AND WE WILL BE HAPPY TO FORWARD ANY INFO DISABILITY THAT THEY NEED

## 2022-03-07 ENCOUNTER — Other Ambulatory Visit: Payer: Self-pay

## 2022-03-15 ENCOUNTER — Ambulatory Visit: Payer: BC Managed Care – PPO | Admitting: Occupational Therapy

## 2022-03-15 ENCOUNTER — Ambulatory Visit: Payer: BC Managed Care – PPO | Attending: Physical Medicine & Rehabilitation | Admitting: Physical Therapy

## 2022-03-15 VITALS — BP 187/88 | HR 63

## 2022-03-15 DIAGNOSIS — R2681 Unsteadiness on feet: Secondary | ICD-10-CM | POA: Diagnosis present

## 2022-03-15 DIAGNOSIS — M6281 Muscle weakness (generalized): Secondary | ICD-10-CM | POA: Diagnosis present

## 2022-03-15 DIAGNOSIS — R2689 Other abnormalities of gait and mobility: Secondary | ICD-10-CM | POA: Insufficient documentation

## 2022-03-15 DIAGNOSIS — I611 Nontraumatic intracerebral hemorrhage in hemisphere, cortical: Secondary | ICD-10-CM | POA: Diagnosis not present

## 2022-03-15 NOTE — Therapy (Signed)
OUTPATIENT PHYSICAL THERAPY NEURO EVALUATION   Patient Name: Joseph Ortega MRN: [id number] DOB:10-02-1955, 66 y.o., male Today's Date: 03/15/2022   PCP: [facility] REFERRING PROVIDER: Meredith Staggers, MD  END OF SESSION:  PT End of Session - 03/15/22 1151     Visit Number 1    Number of Visits 37   with eval   Date for PT Re-Evaluation 06/07/22    Authorization Type [employer]    Progress Note Due on Visit 10    PT Start Time 1148    PT Stop Time 1230    PT Time Calculation (min) 42 min    Activity Tolerance Patient tolerated treatment well    Behavior During Therapy WFL for tasks assessed/performed             Past Medical History:  Diagnosis Date   Diabetes mellitus without complication ([place])    Hypertension    Thyroid disease    Past Surgical History:  Procedure Laterality Date   HERNIA REPAIR     IR ANGIO INTRA EXTRACRAN SEL COM CAROTID INNOMINATE BILAT MOD SED  03/09/2021   IR ANGIO VERTEBRAL SEL VERTEBRAL BILAT MOD SED  03/09/2021   IR US GUIDE VASC ACCESS RIGHT  03/09/2021   Patient Active Problem List   Diagnosis Date Noted   ICH (intracerebral hemorrhage) ([place]) 03/17/2021   Hypothyroidism 03/12/2021   OSA (obstructive sleep apnea) 03/12/2021   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   Uncontrolled type 2 diabetes mellitus with hyperglycemia ([place]) 03/12/2021   Redness of both eyes 03/12/2021   Morbid obesity ([place]) 03/11/2021   Essential hypertension 03/11/2021   Tobacco abuse 03/11/2021   Herpes zoster with complication    SAH (subarachnoid hemorrhage) ([place]) 03/08/2021    ONSET DATE: 01/19/2022  REFERRING DIAG: I61.1 (ICD-10-CM) - Nontraumatic cortical hemorrhage of right cerebral hemisphere (HCC)  THERAPY DIAG:  Muscle weakness (generalized)  Other abnormalities of gait and mobility  Unsteadiness on feet  Rationale for Evaluation and Treatment: Rehabilitation  SUBJECTIVE:                                                                                                                                                                                              SUBJECTIVE STATEMENT: Pt reports he had been working with home health PT since d/c from the hospital but stopped working with them about 2 months ago. Pt reports he has still been working on his HEP from [place]. Pt reports he has trouble standing up and has bad knees (especially the L one). Pt reports he is also still having difficulty with picking up items with  his L hand and feels like his L shoulder has no strength, is painful, and pops out of the joint at times.  Pt sees his PCP in mid-January.  Pt accompanied by: self  PERTINENT HISTORY: DM2, HTN  PAIN:  Are you having pain? Yes: NPRS scale: 2/10 Pain location: neck and shoulders (from spinal stenosis) Pain description: throbbing Aggravating factors: weather Relieving factors: cortisone shots from [facility]  PRECAUTIONS: Fall  WEIGHT BEARING RESTRICTIONS: No  FALLS: Has patient fallen in last 6 months? No  LIVING ENVIRONMENT: Lives with: lives with their spouse Lives in: House/apartment Stairs: Yes: Internal: 2 steps; on right going up and External: 6 steps; none Has following equipment at home: Quad cane large base, Environmental consultant - 2 wheeled, Environmental consultant - 4 wheeled, Wheelchair (manual), bed side commode, Grab bars, and Ramped entry  PLOF: Independent with gait, Independent with transfers, and Requires assistive device for independence  PATIENT GOALS: "get rid of that thing (rollator) and progress to walking with a cane", "get stronger in my legs", "be able to stand and balance without holding onto anything to be able to get back to cooking"  OBJECTIVE:   DIAGNOSTIC FINDINGS:  Brain MRI 07/11/21 IMPRESSION:   MRI brain (with and without) demonstrating: -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 -Chronic ischemic infarction within the right occipital region with associated hemosiderin deposition. -Multiple scattered chronic cerebral microhemorrhages. -Moderate chronic small vessel ischemic disease. -No acute findings.  No evidence of underlying vascular malformations or mass lesions.   -Compared to prior MRI from 03/10/2021, expected evolution of changes noted from prior infarctions.  COGNITION: Overall cognitive status: Within functional limits for tasks assessed   SENSATION: Decreased in LUE and LLE, distal>proximal, feels "heavy"  EDEMA:  In LLE, can't get compression socks independently on but does keep limb elevated when sitting  POSTURE: rounded shoulders and forward head   LOWER EXTREMITY MMT:    MMT Right Eval Left Eval  Hip flexion 5 3-  Hip extension    Hip abduction    Hip adduction    Hip internal rotation    Hip external rotation    Knee flexion 5 4  Knee extension 5 4  Ankle dorsiflexion 1 3  Ankle plantarflexion    Ankle inversion    Ankle eversion    (Blank rows = not tested)  BED MOBILITY:  Mod I per pt report  TRANSFERS: Assistive device utilized: Environmental consultant - 4 wheeled  Sit to stand: Modified independence; pushes LE against back of chair when standing Stand to sit: Modified independence Chair to chair: Modified independence Floor:  not safe to assess at eval  GAIT: Gait pattern: step to pattern, decreased step length- Right, decreased stance time- Left, decreased stride length, decreased hip/knee flexion- Left, and genu recurvatum- Left Distance walked: 115 ft Assistive device utilized: Walker - 4 wheeled Level of assistance: Modified independence Comments: several standing rest breaks required  FUNCTIONAL TESTS:    [facility] PT Assessment - 03/15/22 1203       Ambulation/Gait   Gait velocity 32.8 ft over 31.22 sec = 1.05 ft/sec   with rollator     Standardized Balance Assessment   Standardized Balance Assessment Timed Up and  Go Test;Five Times Sit to Stand    Five times sit to stand comments  36.59 sec   with RUE on arm of chair, CGA, difficult     Timed Up and Go Test  TUG Normal TUG    Normal TUG (seconds) 43.31   with rollator             Vitals:   03/15/22 1225  BP: (!) 187/88  Pulse: 63   BP assessed in R arm in sitting at end of session. Educated pt on elevated BP this date and that BP would need to be better controlled in order to safely participate in future therapy sessions. Pt has a follow-up with his PCP in mid-January and can discuss his medications then, will wait to schedule further PT visits until pt has met with his PCP.   TODAY'S TREATMENT:                                                                                                                              PT Evaluation    PATIENT EDUCATION: Education details: Eval findings, POC, BP management Person educated: Patient Education method: Customer service manager Education comprehension: verbalized understanding, returned demonstration, and needs further education  HOME EXERCISE PROGRAM: To be established next session  GOALS: Goals reviewed with patient? Yes  SHORT TERM GOALS: Target date: 04/27/2022   Pt will be independent with initial HEP for improved strength, balance, transfers and gait. Baseline: Goal status: INITIAL  2.  Berg to be assessed and STG set Baseline:  Goal status: INITIAL  3.  Pt will improve normal TUG to less than or equal to 35 seconds for improved functional mobility and decreased fall risk. Baseline: 43.31 sec (12/28) Goal status: INITIAL  4.  Pt will improve gait velocity to at least 1.25 ft/sec for improved gait efficiency and performance at mod I level  Baseline: 1.05 ft/sec (12/28) Goal status: INITIAL   LONG TERM GOALS: Target date: 05/25/2022     Pt will be independent with final HEP for improved strength, balance, transfers and gait. Baseline:  Goal status: INITIAL  2.   Berg to be assessed and LTG set Baseline:  Goal status: INITIAL  3.  Pt will improve normal TUG to less than or equal to 30 seconds for improved functional mobility and decreased fall risk. Baseline: 43.31 sec (12/28) Goal status: INITIAL  4.  Pt will improve gait velocity to at least 1.5 ft/sec for improved gait efficiency and performance at mod I level  Baseline: 1.05 ft/sec (12/28) Goal status: INITIAL  5.  Pt will improve 5 x STS to less than or equal to 30 seconds to demonstrate improved functional strength and transfer efficiency.  Baseline: 36.59 sec (12/28) Goal status: INITIAL   ASSESSMENT:  CLINICAL IMPRESSION: Patient is a 66 year old male referred to Neuro OPPT for CVA.   Pt's PMH is significant for: DM2, HTN. The following deficits were present during the exam: decreased LE strength, decreased balance, impaired gait, decreased gait speed. Based on his gait speed of 1.05 ft/sec, 5xSTS score of 36.59 sec, and TUG score of 43.31 sec, pt is an increased risk for falls.  Pt would benefit from skilled PT to address these impairments and functional limitations to maximize functional mobility independence.   OBJECTIVE IMPAIRMENTS: Abnormal gait, cardiopulmonary status limiting activity, decreased activity tolerance, decreased balance, decreased endurance, decreased knowledge of use of DME, decreased mobility, difficulty walking, decreased strength, impaired perceived functional ability, impaired sensation, and impaired UE functional use.   ACTIVITY LIMITATIONS: carrying, lifting, bending, standing, squatting, stairs, transfers, and reach over head  PARTICIPATION LIMITATIONS: meal prep, driving, and community activity  PERSONAL FACTORS: Fitness, Transportation, and 1-2 comorbidities:   DM2, HTN are also affecting patient's functional outcome.   REHAB POTENTIAL: Good  CLINICAL DECISION MAKING: Stable/uncomplicated  EVALUATION COMPLEXITY: Moderate  PLAN:  PT FREQUENCY:  2x/week  PT DURATION: 8 weeks (certification for 12 wks to allow for scheduling delays)  PLANNED INTERVENTIONS: Therapeutic exercises, Therapeutic activity, Neuromuscular re-education, Balance training, Gait training, Patient/Family education, Self Care, Joint mobilization, Stair training, Vestibular training, Canalith repositioning, Visual/preceptual remediation/compensation, Orthotic/Fit training, DME instructions, Aquatic Therapy, Dry Needling, Electrical stimulation, Wheelchair mobility training, Cryotherapy, Moist heat, Taping, Manual therapy, and Re-evaluation  PLAN FOR NEXT SESSION: check BP!, did pt follow up with PCP regarding BP meds? assess Berg and write STG, LTG, practice gait with quad cane, work on fall recovery (floor transfer), initiate HEP for LE strength, balance, endurance   Excell Seltzer, PT, DPT, CSRS 03/15/2022, 12:44 PM

## 2022-04-03 ENCOUNTER — Ambulatory Visit: Payer: BC Managed Care – PPO | Attending: Physical Medicine & Rehabilitation | Admitting: Physical Therapy

## 2022-04-03 VITALS — BP 172/84 | HR 51

## 2022-04-03 DIAGNOSIS — R2689 Other abnormalities of gait and mobility: Secondary | ICD-10-CM

## 2022-04-03 DIAGNOSIS — I69354 Hemiplegia and hemiparesis following cerebral infarction affecting left non-dominant side: Secondary | ICD-10-CM | POA: Insufficient documentation

## 2022-04-03 DIAGNOSIS — R600 Localized edema: Secondary | ICD-10-CM | POA: Diagnosis present

## 2022-04-03 DIAGNOSIS — R2681 Unsteadiness on feet: Secondary | ICD-10-CM | POA: Diagnosis present

## 2022-04-03 DIAGNOSIS — M25512 Pain in left shoulder: Secondary | ICD-10-CM | POA: Diagnosis present

## 2022-04-03 DIAGNOSIS — I611 Nontraumatic intracerebral hemorrhage in hemisphere, cortical: Secondary | ICD-10-CM | POA: Diagnosis present

## 2022-04-03 DIAGNOSIS — R278 Other lack of coordination: Secondary | ICD-10-CM | POA: Diagnosis present

## 2022-04-03 DIAGNOSIS — G8929 Other chronic pain: Secondary | ICD-10-CM | POA: Diagnosis present

## 2022-04-03 DIAGNOSIS — M6281 Muscle weakness (generalized): Secondary | ICD-10-CM | POA: Diagnosis present

## 2022-04-03 NOTE — Therapy (Signed)
OUTPATIENT PHYSICAL THERAPY NEURO TREATMENT   Patient Name: Joseph Ortega MRN: [id number] DOB:[birth year], 67 y.o., male Today's Date: 04/03/2022   PCP: [facility] REFERRING PROVIDER: Meredith Staggers, MD  END OF SESSION:  PT End of Session - 04/03/22 1148     Visit Number 2    Number of Visits 42   with eval   Date for PT Re-Evaluation 06/07/22    Authorization Type [employer]    Progress Note Due on Visit 10    PT Start Time [id number]    PT Stop Time 1228    PT Time Calculation (min) 43 min    Equipment Utilized During Treatment Gait belt    Activity Tolerance Patient tolerated treatment well    Behavior During Therapy WFL for tasks assessed/performed              Past Medical History:  Diagnosis Date   Diabetes mellitus without complication ([place])    Hypertension    Thyroid disease    Past Surgical History:  Procedure Laterality Date   HERNIA REPAIR     IR ANGIO INTRA EXTRACRAN SEL COM CAROTID INNOMINATE BILAT MOD SED  03/09/2021   IR ANGIO VERTEBRAL SEL VERTEBRAL BILAT MOD SED  03/09/2021   IR US GUIDE VASC ACCESS RIGHT  03/09/2021   Patient Active Problem List   Diagnosis Date Noted   ICH (intracerebral hemorrhage) ([place]) 03/17/2021   Hypothyroidism 03/12/2021   OSA (obstructive sleep apnea) 03/12/2021   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   Uncontrolled type 2 diabetes mellitus with hyperglycemia ([place]) 03/12/2021   Redness of both eyes 03/12/2021   Morbid obesity ([place]) 03/11/2021   Essential hypertension 03/11/2021   Tobacco abuse 03/11/2021   Herpes zoster with complication    SAH (subarachnoid hemorrhage) ([place]) 03/08/2021    ONSET DATE: 01/19/2022  REFERRING DIAG: I61.1 (ICD-10-CM) - Nontraumatic cortical hemorrhage of right cerebral hemisphere (HCC)  THERAPY DIAG:  Muscle weakness (generalized)  Other abnormalities of gait and mobility  Unsteadiness on feet  Nontraumatic cortical hemorrhage of right cerebral hemisphere  [facility])  Rationale for Evaluation and Treatment: Rehabilitation  SUBJECTIVE:                                                                                                                                                                                             SUBJECTIVE STATEMENT: Pt reports he saw his PCP after last visit and discussed his elevated BP, no changes made at this time pt to just continue with his current medication regimen. Pt does have another follow up with his PCP next week. No falls or other acute  changes since last visit. Pt reports he is having chronic neck pain from stenosis, no other complaints of pain.  Pt also reports that he has an HEP from CIR and from [place]; working on a few balance exercises.   Pt accompanied by: self  PERTINENT HISTORY: DM2, HTN  PAIN:  Are you having pain? Yes: NPRS scale: 2/10 Pain location: neck and shoulders (from spinal stenosis) Pain description: throbbing Aggravating factors: weather Relieving factors: cortisone shots from [facility]  PRECAUTIONS: Fall  WEIGHT BEARING RESTRICTIONS: No  FALLS: Has patient fallen in last 6 months? No  LIVING ENVIRONMENT: Lives with: lives with their spouse Lives in: House/apartment Stairs: Yes: Internal: 2 steps; on right going up and External: 6 steps; none Has following equipment at home: Quad cane large base, Environmental consultant - 2 wheeled, Environmental consultant - 4 wheeled, Wheelchair (manual), bed side commode, Grab bars, and Ramped entry  PLOF: Independent with gait, Independent with transfers, and Requires assistive device for independence  PATIENT GOALS: "get rid of that thing (rollator) and progress to walking with a cane", "get stronger in my legs", "be able to stand and balance without holding onto anything to be able to get back to cooking"  OBJECTIVE:   DIAGNOSTIC FINDINGS:  Brain MRI 07/11/21 IMPRESSION:   MRI brain (with and without) demonstrating: -Chronic ischemic infarction within  the right perirolandic region with adjacent subarachnoid blood products within the central sulcus. -Chronic ischemic infarction within the right occipital region with associated hemosiderin deposition. -Multiple scattered chronic cerebral microhemorrhages. -Moderate chronic small vessel ischemic disease. -No acute findings.  No evidence of underlying vascular malformations or mass lesions.   -Compared to prior MRI from 03/10/2021, expected evolution of changes noted from prior infarctions.   TODAY'S TREATMENT:                 Vitals:   04/03/22 1153  BP: (!) 172/84  Pulse: (!) 51  Vitals assessed at beginning of therapy session. Educated pt on importance of continuing to monitor his BP at home and keeping a log (pt has a BP monitor but needs to get new batteries for it).                                                                                                                THER ACT:  [facility] PT Assessment - 04/03/22 1200       Standardized Balance Assessment   Standardized Balance Assessment Berg Balance Test      Berg Balance Test   Sit to Stand Able to stand  independently using hands    Standing Unsupported Able to stand 2 minutes with supervision    Sitting with Back Unsupported but Feet Supported on Floor or Stool Able to sit safely and securely 2 minutes    Stand to Sit Controls descent by using hands    Transfers Needs one person to assist    Standing Unsupported with Eyes Closed Able to stand 10 seconds with supervision    Standing Unsupported with Feet Together  Needs help to attain position but able to stand for 30 seconds with feet together    From Standing, Reach Forward with Outstretched Arm Reaches forward but needs supervision    From Standing Position, Pick up Object from Floor Unable to try/needs assist to keep balance    From Standing Position, Turn to Look Behind Over each Shoulder Needs supervision when turning    Turn 360 Degrees Needs assistance while  turning    Standing Unsupported, Alternately Place Feet on Step/Stool Needs assistance to keep from falling or unable to try    Standing Unsupported, One Foot in [place] help to step but can hold 15 seconds    Standing on One Leg Tries to lift leg/unable to hold 3 seconds but remains standing independently    Total Score 22    Berg comment: High fall risk            2MWT: 164 ft with rollator and Supervision   THER EX: Sit to stands x 10 reps, no UE support, from slightly elevated mat  Added to HEP, see bolded below    PATIENT EDUCATION: Education details: initiated HEP, OM results and functional implications, BP management Person educated: Patient Education method: Explanation, Demonstration, and Handouts Education comprehension: verbalized understanding, returned demonstration, and needs further education  HOME EXERCISE PROGRAM: Access Code: [email or web address] URL: [email or web address] Date: 04/03/2022 Prepared by: Excell Seltzer  Exercises - Sit to Stand Without Arm Support  - 1 x daily - 7 x weekly - 3 sets - 10 reps  GOALS: Goals reviewed with patient? Yes  SHORT TERM GOALS: Target date: 04/27/2022   Pt will be independent with initial HEP for improved strength, balance, transfers and gait. Baseline: Goal status: INITIAL  2.  Pt will improve Berg score to 26/56 for decreased fall risk Baseline: 22/56 (1/16) Goal status: INITIAL  3.  Pt will improve normal TUG to less than or equal to 35 seconds for improved functional mobility and decreased fall risk. Baseline: 43.31 sec (12/28) Goal status: INITIAL  4.  Pt will improve gait velocity to at least 1.25 ft/sec for improved gait efficiency and performance at mod I level  Baseline: 1.05 ft/sec (12/28) Goal status: INITIAL   LONG TERM GOALS: Target date: 05/25/2022     Pt will be independent with final HEP for improved strength, balance, transfers and gait. Baseline:  Goal status: INITIAL  2.  Pt  will improve Berg score to 30/56 for decreased fall risk Baseline: 22/56 (1/16) Goal status: INITIAL  3.  Pt will improve normal TUG to less than or equal to 30 seconds for improved functional mobility and decreased fall risk. Baseline: 43.31 sec (12/28) Goal status: INITIAL  4.  Pt will improve gait velocity to at least 1.5 ft/sec for improved gait efficiency and performance at mod I level  Baseline: 1.05 ft/sec (12/28) Goal status: INITIAL  5.  Pt will improve 5 x STS to less than or equal to 30 seconds to demonstrate improved functional strength and transfer efficiency.  Baseline: 36.59 sec (12/28) Goal status: INITIAL  6.  Pt will improve his score on the 2MWT to 200 ft with LRAD at mod I level Baseline: 164 ft with rollator at Supervision level (1/16) Goal status: INITIAL   ASSESSMENT:  CLINICAL IMPRESSION: Emphasis of skilled PT session on assessing Berg and 2MWT in order to further assess balance and endurance deficits as well as initiating HEP for LE strengthening. Patient demonstrates increased fall risk as noted by score  of  22/56 on Berg Balance Scale.  (<36= high risk for falls, close to 100%; 37-45 significant >80%; 46-51 moderate >50%; 52-55 lower >25%), reviewed score and functional implications with patient. Pt is able to ambulate x 164 ft during the , indicating decreased endurance level. Pt continues to benefit from skilled therapy services to address L hemibody weakness, decreased balance, decreased strength, and decreased safety and independence with functional mobility. Continue POC.   OBJECTIVE IMPAIRMENTS: Abnormal gait, cardiopulmonary status limiting activity, decreased activity tolerance, decreased balance, decreased endurance, decreased knowledge of use of DME, decreased mobility, difficulty walking, decreased strength, impaired perceived functional ability, impaired sensation, and impaired UE functional use.   ACTIVITY LIMITATIONS: carrying, lifting,  bending, standing, squatting, stairs, transfers, and reach over head  PARTICIPATION LIMITATIONS: meal prep, driving, and community activity  PERSONAL FACTORS: Fitness, Transportation, and 1-2 comorbidities:   DM2, HTN are also affecting patient's functional outcome.   REHAB POTENTIAL: Good  CLINICAL DECISION MAKING: Stable/uncomplicated  EVALUATION COMPLEXITY: Moderate  PLAN:  PT FREQUENCY: 2x/week  PT DURATION: 8 weeks (certification for 12 wks to allow for scheduling delays)  PLANNED INTERVENTIONS: Therapeutic exercises, Therapeutic activity, Neuromuscular re-education, Balance training, Gait training, Patient/Family education, Self Care, Joint mobilization, Stair training, Vestibular training, Canalith repositioning, Visual/preceptual remediation/compensation, Orthotic/Fit training, DME instructions, Aquatic Therapy, Dry Needling, Electrical stimulation, Wheelchair mobility training, Cryotherapy, Moist heat, Taping, Manual therapy, and Re-evaluation  PLAN FOR NEXT SESSION: check BP!, add to HEP for LE strength, balance, endurance (walking program?), practice gait with quad cane, work on fall recovery when safe and able (floor transfer)   Peter Congo, PT, DPT, CSRS 04/03/2022, 12:28 PM

## 2022-04-05 ENCOUNTER — Ambulatory Visit: Payer: BC Managed Care – PPO | Admitting: Physical Therapy

## 2022-04-05 VITALS — BP 172/76 | HR 52

## 2022-04-05 DIAGNOSIS — R2681 Unsteadiness on feet: Secondary | ICD-10-CM

## 2022-04-05 DIAGNOSIS — M6281 Muscle weakness (generalized): Secondary | ICD-10-CM

## 2022-04-05 DIAGNOSIS — R2689 Other abnormalities of gait and mobility: Secondary | ICD-10-CM

## 2022-04-05 NOTE — Therapy (Signed)
OUTPATIENT PHYSICAL THERAPY NEURO TREATMENT   Patient Name: Joseph Ortega MRN: [id number] DOB:[birth year], 67 y.o., male Today's Date: 04/05/2022   PCP: [facility] REFERRING PROVIDER: Ranelle Oyster, MD  END OF SESSION:  PT End of Session - 04/05/22 1108     Visit Number 3    Number of Visits 17   with eval   Date for PT Re-Evaluation 06/07/22    Authorization Type Blue Cross Blue Shield    Progress Note Due on Visit 10    PT Start Time 1105   Pt on phone in lobby   PT Stop Time 1148    PT Time Calculation (min) 43 min    Equipment Utilized During Treatment Gait belt    Activity Tolerance Patient tolerated treatment well    Behavior During Therapy WFL for tasks assessed/performed               Past Medical History:  Diagnosis Date   Diabetes mellitus without complication (HCC)    Hypertension    Thyroid disease    Past Surgical History:  Procedure Laterality Date   HERNIA REPAIR     IR ANGIO INTRA EXTRACRAN SEL COM CAROTID INNOMINATE BILAT MOD SED  03/09/2021   IR ANGIO VERTEBRAL SEL VERTEBRAL BILAT MOD SED  03/09/2021   IR US GUIDE VASC ACCESS RIGHT  03/09/2021   Patient Active Problem List   Diagnosis Date Noted   ICH (intracerebral hemorrhage) (HCC) 03/17/2021   Hypothyroidism 03/12/2021   OSA (obstructive sleep apnea) 03/12/2021   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   Uncontrolled type 2 diabetes mellitus with hyperglycemia (HCC) 03/12/2021   Redness of both eyes 03/12/2021   Morbid obesity (HCC) 03/11/2021   Essential hypertension 03/11/2021   Tobacco abuse 03/11/2021   Herpes zoster with complication    SAH (subarachnoid hemorrhage) (HCC) 03/08/2021    ONSET DATE: 01/19/2022  REFERRING DIAG: I61.1 (ICD-10-CM) - Nontraumatic cortical hemorrhage of right cerebral hemisphere (HCC)  THERAPY DIAG:  Muscle weakness (generalized)  Other abnormalities of gait and mobility  Unsteadiness on feet  Rationale for Evaluation and Treatment:  Rehabilitation  SUBJECTIVE:                                                                                                                                                                                             SUBJECTIVE STATEMENT: Pt reports he had his BP checked at the psychologist yesterday and was 176/84 mmHg. States the psychologist gave him a prescription for something to help, has not picked it up yet. No falls, HEP is making him sore.   Pt also reports that he has  an HEP from CIR and from [place]; working on a few balance exercises.   Pt accompanied by: self  PERTINENT HISTORY: DM2, HTN  PAIN:  Are you having pain? Yes: NPRS scale: 3-4/10 Pain location: neck and shoulders (from spinal stenosis) Pain description: throbbing Aggravating factors: weather Relieving factors: cortisone shots from [facility]  PRECAUTIONS: Fall  VITALS Vitals:   04/05/22 1112  BP: (!) 172/76  Pulse: (!) 52     WEIGHT BEARING RESTRICTIONS: No  FALLS: Has patient fallen in last 6 months? No  LIVING ENVIRONMENT: Lives with: lives with their spouse Lives in: House/apartment Stairs: Yes: Internal: 2 steps; on right going up and External: 6 steps; none Has following equipment at home: Quad cane large base, Environmental consultant - 2 wheeled, Environmental consultant - 4 wheeled, Wheelchair (manual), bed side commode, Grab bars, and Ramped entry  PLOF: Independent with gait, Independent with transfers, and Requires assistive device for independence  PATIENT GOALS: "get rid of that thing (rollator) and progress to walking with a cane", "get stronger in my legs", "be able to stand and balance without holding onto anything to be able to get back to cooking"  OBJECTIVE:   DIAGNOSTIC FINDINGS:  Brain MRI 07/11/21 IMPRESSION:   MRI brain (with and without) demonstrating: -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 -Chronic  ischemic infarction within the right occipital region with associated hemosiderin deposition. -Multiple scattered chronic cerebral microhemorrhages. -Moderate chronic small vessel ischemic disease. -No acute findings.  No evidence of underlying vascular malformations or mass lesions.   -Compared to prior MRI from 03/10/2021, expected evolution of changes noted from prior infarctions.   TODAY'S TREATMENT:                 Ther Act  Assessed BP (see above)   Ther Ex  Added to HEP (see bolded below) for improved BLE strength. Performed in // bars w/BUE support in clinic: Mini Squat with Counter Support, x15 reps. Mod cues for proper form throughout and to keep weight on heels rather than lean forward.  Standing Hip Extension with Unilateral Counter Support, 2x10 per side. Min cues to avoid forward lean compensation to to maintain breathing throughout as pt tends to valsalva  Standing Hip Abduction with Unilateral Counter Support, 2x10 per side. Min cues for proper form and to maintain breathing  SciFit multi-peaks level 5 for 10 minutes using BUE/BLEs for  dynamic cardiovascular conditioning and global strength. Noted abduction of L knee throughout as pt unable to maintain proper knee position 2/2 weakness. Pt able to maintain grip w/LUE throughout. RPE of 3-4/10 following activity    PATIENT EDUCATION: Education details: Reiterated importance of continuing to monitor his BP at home and keeping a log (pt has a BP monitor but needs to get new batteries for it), additions to HEP  Person educated: Patient Education method: Explanation, Demonstration, and Handouts Education comprehension: verbalized understanding, returned demonstration, and needs further education  HOME EXERCISE PROGRAM: Access Code: [email or web address] URL: [email or web address] Date: 04/03/2022 Prepared by: Excell Seltzer  Exercises - Sit to Stand Without Arm Support  - 1 x daily - 7 x weekly - 3 sets - 10 reps - Mini  Squat with Counter Support  - 1 x daily - 7 x weekly - 3 sets - 10 reps - Standing Hip Extension with Unilateral Counter Support  - 1 x daily - 7 x weekly - 3 sets - 10 reps - Standing Hip Abduction  with Unilateral Counter Support  - 1 x daily - 7 x weekly - 3 sets - 10 reps  GOALS: Goals reviewed with patient? Yes  SHORT TERM GOALS: Target date: 04/27/2022   Pt will be independent with initial HEP for improved strength, balance, transfers and gait. Baseline: Goal status: INITIAL  2.  Pt will improve Berg score to 26/56 for decreased fall risk Baseline: 22/56 (1/16) Goal status: INITIAL  3.  Pt will improve normal TUG to less than or equal to 35 seconds for improved functional mobility and decreased fall risk. Baseline: 43.31 sec (12/28) Goal status: INITIAL  4.  Pt will improve gait velocity to at least 1.25 ft/sec for improved gait efficiency and performance at mod I level  Baseline: 1.05 ft/sec (12/28) Goal status: INITIAL   LONG TERM GOALS: Target date: 05/25/2022     Pt will be independent with final HEP for improved strength, balance, transfers and gait. Baseline:  Goal status: INITIAL  2.  Pt will improve Berg score to 30/56 for decreased fall risk Baseline: 22/56 (1/16) Goal status: INITIAL  3.  Pt will improve normal TUG to less than or equal to 30 seconds for improved functional mobility and decreased fall risk. Baseline: 43.31 sec (12/28) Goal status: INITIAL  4.  Pt will improve gait velocity to at least 1.5 ft/sec for improved gait efficiency and performance at mod I level  Baseline: 1.05 ft/sec (12/28) Goal status: INITIAL  5.  Pt will improve 5 x STS to less than or equal to 30 seconds to demonstrate improved functional strength and transfer efficiency.  Baseline: 36.59 sec (12/28) Goal status: INITIAL  6.  Pt will improve his score on the 2MWT to 200 ft with LRAD at mod I level Baseline: 164 ft with rollator at Supervision level (1/16) Goal status:  INITIAL   ASSESSMENT:  CLINICAL IMPRESSION: Emphasis of skilled PT session on adding to HEP for improved glute/hip strength and endurance. Pt demonstrates significant hip abduction/ER/extension strength resulting in decreased step length/clearance and stability w/unsupported gait. Added hip exercises to HEP which were difficult for pt but pt able to perform slowly. Pt's BP continues to be elevated and he has new prescription to pick up from psych to assist. Continue POC.    OBJECTIVE IMPAIRMENTS: Abnormal gait, cardiopulmonary status limiting activity, decreased activity tolerance, decreased balance, decreased endurance, decreased knowledge of use of DME, decreased mobility, difficulty walking, decreased strength, impaired perceived functional ability, impaired sensation, and impaired UE functional use.   ACTIVITY LIMITATIONS: carrying, lifting, bending, standing, squatting, stairs, transfers, and reach over head  PARTICIPATION LIMITATIONS: meal prep, driving, and community activity  PERSONAL FACTORS: Fitness, Transportation, and 1-2 comorbidities:   DM2, HTN are also affecting patient's functional outcome.   REHAB POTENTIAL: Good  CLINICAL DECISION MAKING: Stable/uncomplicated  EVALUATION COMPLEXITY: Moderate  PLAN:  PT FREQUENCY: 2x/week  PT DURATION: 8 weeks (certification for 12 wks to allow for scheduling delays)  PLANNED INTERVENTIONS: Therapeutic exercises, Therapeutic activity, Neuromuscular re-education, Balance training, Gait training, Patient/Family education, Self Care, Joint mobilization, Stair training, Vestibular training, Canalith repositioning, Visual/preceptual remediation/compensation, Orthotic/Fit training, DME instructions, Aquatic Therapy, Dry Needling, Electrical stimulation, Wheelchair mobility training, Cryotherapy, Moist heat, Taping, Manual therapy, and Re-evaluation  PLAN FOR NEXT SESSION: check BP!, add to HEP for LE strength, balance, endurance (walking  program?), practice gait with quad cane, work on fall recovery when safe and able (floor transfer), hip strength, lateral weight shifting    Larrisha Babineau E Seneca Hoback, PT, DPT 04/05/2022, 11:53 AM

## 2022-04-09 NOTE — Therapy (Signed)
OUTPATIENT OCCUPATIONAL THERAPY NEURO EVALUATION  Patient Name: Joseph Ortega MRN: [id number] DOB:1955-05-27, 67 y.o., male 19 Date: 04/10/2022  PCP: Dr. Cristie Hem REFERRING PROVIDER: Meredith Staggers, MD  END OF SESSION:  OT End of Session - 04/10/22 1305     Visit Number 1    Number of Visits 13    Date for OT Re-Evaluation 05/25/22    Authorization Type BC/BS, VL: 30 Combined    OT Start Time 8527    OT Stop Time 73    OT Time Calculation (min) 45 min    Activity Tolerance Patient tolerated treatment well    Behavior During Therapy WFL for tasks assessed/performed             Past Medical History:  Diagnosis Date   Diabetes mellitus without complication ([place])    Hypertension    Thyroid disease    Past Surgical History:  Procedure Laterality Date   HERNIA REPAIR     IR ANGIO INTRA EXTRACRAN SEL COM CAROTID INNOMINATE BILAT MOD SED  03/09/2021   IR ANGIO VERTEBRAL SEL VERTEBRAL BILAT MOD SED  03/09/2021   IR US GUIDE VASC ACCESS RIGHT  03/09/2021   Patient Active Problem List   Diagnosis Date Noted   ICH (intracerebral hemorrhage) ([place]) 03/17/2021   Hypothyroidism 03/12/2021   OSA (obstructive sleep apnea) 03/12/2021   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   Uncontrolled type 2 diabetes mellitus with hyperglycemia ([place]) 03/12/2021   Redness of both eyes 03/12/2021   Morbid obesity ([place]) 03/11/2021   Essential hypertension 03/11/2021   Tobacco abuse 03/11/2021   Herpes zoster with complication    SAH (subarachnoid hemorrhage) ([place]) 03/08/2021    ONSET DATE: 02/28/2022 Referral date  REFERRING DIAG:  Diagnosis  I61.1 (ICD-10-CM) - Nontraumatic cortical hemorrhage of right cerebral hemisphere ([place])   Note from referral: DX: Right frontal infarct with left heimparesis RX: Eval and treat, pt is already scheduled. Please assess for vivistim  THERAPY DIAG:  Hemiplegia and hemiparesis following cerebral infarction affecting left non-dominant side  (HCC)  Unsteadiness on feet  Other lack of coordination  Localized edema  Chronic left shoulder pain  Rationale for Evaluation and Treatment: Rehabilitation  SUBJECTIVE:   SUBJECTIVE STATEMENT: I want my arm to get better Pt accompanied by:  grandson  PERTINENT HISTORY: CVA 02/2021, DM2, HTN  PRECAUTIONS: Fall and Other: no heavy lifting overhead, ? driving  WEIGHT BEARING RESTRICTIONS: No  PAIN:  Are you having pain? No, but mild pain when he attempts to lift LUE  FALLS: Has patient fallen in last 6 months? 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, likes to cook  PATIENT GOALS: get my Lt arm working  OBJECTIVE:   HAND DOMINANCE: Right  ADLs: Transfers/ambulation related to ADLs: Eating: assist to cut food Grooming: mod I  UB Dressing: mod I  LB Dressing: mod I w/ elastic pants and velcro shoes, occasional assist for socks Toileting: mod I  Bathing: min assist for washing back, rinsing  Tub Shower transfers: mod I w/ walk in  shower, 2 grab bars Equipment:  see above  IADLs: Shopping: mod assist Light housekeeping: pt does laundry but needs assist to fold Meal Prep: max assist for cooking Community mobility: pt drives short distances, relies on others for longer distances Medication management: independent Handwriting:  denies change  MOBILITY STATUS:  rollator in community, RW in house   FUNCTIONAL OUTCOME MEASURES: Upper Extremity Functional Scale  (UEFS): 43.75% of 80%  UPPER EXTREMITY ROM:  RUE AROM WNL's.  LUE: Dominated by synergy pattern w/ min shoulder movement going into abduction w/ no distal control. However distally (isolated), pt has supination to neutral, 75% wrist ext, 50% full composite flex, and 90% full composite ext.   HAND FUNCTION: Grip strength: Right: 84.6 lbs; Left: 23.3 lbs  COORDINATION: Pt could pick up block from lower surface w/ gross grasp, but unable to do Box & Blocks d/t limited shoulder movement  SENSATION: WFL  EDEMA: min to moderate Lt hand  MUSCLE TONE: LUE: Hypotonic  COGNITION: Overall cognitive status:  pt reports short term memory deficits since stroke  VISION: Subjective report: I had diplopia but that resolved.  Baseline vision: Wears glasses for reading only Visual history: retinopathy and corrective eye surgery  VISION ASSESSMENT: Decreased Lt lower quadrant peripheral vision    PERCEPTION: Not tested, Did require cueing to let go of walker with Lt hand.    OBSERVATIONS: Pt's stroke was hemorrhagic - therefore pt does not qualify for vivistim at this time   TODAY'S TREATMENT:                                                                                                                              N/A  PATIENT EDUCATION: Education details: OT POC Person educated: Patient Education method: Explanation Education comprehension: verbalized understanding  HOME EXERCISE PROGRAM: N/A   GOALS: Goals reviewed with patient? Yes  SHORT TERM GOALS: Target date: 05/04/22  Pt will be independent with initial HEP for LUE Baseline: not yet issued Goal status: INITIAL  2.  Pt will verbalize understanding of A/E needs to increase independence with donning socks, cooking, etc Baseline: not yet addressed Goal status: INITIAL  3.  Pt will consistently don socks I'ly Baseline: occasional assist required Goal status: INITIAL  4. Pt will verbalize understanding with memory  compensatory strategies  Baseline: not yet issued  Goal status: INITIAL    LONG TERM GOALS: Target date: 05/25/22  Pt will be independent with updated HEP for LUE Baseline: not yet addressed Goal status: INITIAL  2.  Pt will improve LUE function as being able to perform 5 blocks on Box & Blocks test Baseline: unable  Goal status: INITIAL  3.  Pt will improve UEFS from 43% to 46% or higher  Baseline: 43% Goal status: INITIAL  4.  Pt will be able to prepare simple meal I'ly  Baseline: needs assist Goal status: INITIAL  5.  Pt will demo 30* sh flexion LUE for low level  reaching Baseline: no true sh flex, approx 60* abduction w/ no distal control Goal status: INITIAL   ASSESSMENT:  CLINICAL IMPRESSION: Patient is a 67  y.o. male who was seen today for occupational therapy evaluation for Lt hemiplegia from CVA in Dec 2022. Pt presents with hemiplegia LUE, decreased balance, decreased ability to perform ADLS and limited use of LUE. Pt would benefit from outpatient O.T. to address these deficits and maximize function and safety w/ ADLS.   PERFORMANCE DEFICITS: in functional skills including ADLs, IADLs, coordination, edema, tone, ROM, strength, pain, Gross motor control, mobility, balance, body mechanics, endurance, decreased knowledge of use of DME, vision, and UE functional use, cognitive skills including memory,  IMPAIRMENTS: are limiting patient from ADLs and IADLs.   CO-MORBIDITIES: may have co-morbidities  that affects occupational performance. Patient will benefit from skilled OT to address above impairments and improve overall function.  MODIFICATION OR ASSISTANCE TO COMPLETE EVALUATION: No modification of tasks or assist necessary to complete an evaluation.  OT OCCUPATIONAL PROFILE AND HISTORY: Problem focused assessment: Including review of records relating to presenting problem.  CLINICAL DECISION MAKING: Moderate - several treatment options, min-mod task modification  necessary  REHAB POTENTIAL: Fair time since onset  EVALUATION COMPLEXITY: Low    PLAN:  OT FREQUENCY: 2x/week  OT DURATION: 6 weeks  PLANNED INTERVENTIONS: self care/ADL training, therapeutic exercise, therapeutic activity, neuromuscular re-education, manual therapy, passive range of motion, functional mobility training, splinting, electrical stimulation, moist heat, cryotherapy, contrast bath, patient/family education, cognitive remediation/compensation, visual/perceptual remediation/compensation, energy conservation, and DME and/or AE instructions  RECOMMENDED OTHER SERVICES: none at this time  CONSULTED AND AGREED WITH PLAN OF CARE: Patient  PLAN FOR NEXT SESSION: initial HEP      Hans Eden, OT 04/10/2022, 1:08 PM

## 2022-04-10 ENCOUNTER — Ambulatory Visit: Payer: BC Managed Care – PPO | Admitting: Physical Therapy

## 2022-04-10 ENCOUNTER — Ambulatory Visit: Payer: BC Managed Care – PPO | Admitting: Occupational Therapy

## 2022-04-10 VITALS — BP 156/74 | HR 93

## 2022-04-10 DIAGNOSIS — R600 Localized edema: Secondary | ICD-10-CM

## 2022-04-10 DIAGNOSIS — G8929 Other chronic pain: Secondary | ICD-10-CM

## 2022-04-10 DIAGNOSIS — M6281 Muscle weakness (generalized): Secondary | ICD-10-CM | POA: Diagnosis not present

## 2022-04-10 DIAGNOSIS — I69354 Hemiplegia and hemiparesis following cerebral infarction affecting left non-dominant side: Secondary | ICD-10-CM

## 2022-04-10 DIAGNOSIS — R2681 Unsteadiness on feet: Secondary | ICD-10-CM

## 2022-04-10 DIAGNOSIS — R2689 Other abnormalities of gait and mobility: Secondary | ICD-10-CM

## 2022-04-10 DIAGNOSIS — R278 Other lack of coordination: Secondary | ICD-10-CM

## 2022-04-10 NOTE — Therapy (Signed)
OUTPATIENT PHYSICAL THERAPY NEURO TREATMENT   Patient Name: Adryel Wortmann MRN: [id number] DOB:30-Nov-1955, 67 y.o., male Today's Date: 04/10/2022   PCP: [facility] REFERRING PROVIDER: Ranelle Oyster, MD  END OF SESSION:  PT End of Session - 04/10/22 1109     Visit Number 4    Number of Visits 17   with eval   Date for PT Re-Evaluation 06/07/22    Authorization Type [employer]    Progress Note Due on Visit 10    PT Start Time 1107   Previous pt session ran late   PT Stop Time 1146    PT Time Calculation (min) 39 min    Equipment Utilized During Treatment Gait belt    Activity Tolerance Patient tolerated treatment well    Behavior During Therapy WFL for tasks assessed/performed                Past Medical History:  Diagnosis Date   Diabetes mellitus without complication (HCC)    Hypertension    Thyroid disease    Past Surgical History:  Procedure Laterality Date   HERNIA REPAIR     IR ANGIO INTRA EXTRACRAN SEL COM CAROTID INNOMINATE BILAT MOD SED  03/09/2021   IR ANGIO VERTEBRAL SEL VERTEBRAL BILAT MOD SED  03/09/2021   IR US GUIDE VASC ACCESS RIGHT  03/09/2021   Patient Active Problem List   Diagnosis Date Noted   ICH (intracerebral hemorrhage) (HCC) 03/17/2021   Hypothyroidism 03/12/2021   OSA (obstructive sleep apnea) 03/12/2021   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   Uncontrolled type 2 diabetes mellitus with hyperglycemia (HCC) 03/12/2021   Redness of both eyes 03/12/2021   Morbid obesity (HCC) 03/11/2021   Essential hypertension 03/11/2021   Tobacco abuse 03/11/2021   Herpes zoster with complication    SAH (subarachnoid hemorrhage) (HCC) 03/08/2021    ONSET DATE: 01/19/2022  REFERRING DIAG: I61.1 (ICD-10-CM) - Nontraumatic cortical hemorrhage of right cerebral hemisphere (HCC)  THERAPY DIAG:  Unsteadiness on feet  Muscle weakness (generalized)  Other abnormalities of gait and mobility  Rationale for Evaluation and  Treatment: Rehabilitation  SUBJECTIVE:                                                                                                                                                                                             SUBJECTIVE STATEMENT: Pt reports he is "struggling" today, is very fatigued and is having a very hard time moving today. Has not been sleeping well. No falls.   Pt also reports that he has an HEP from CIR and from HHPT; working on a few balance exercises.  Pt accompanied by: self  PERTINENT HISTORY: DM2, HTN  PAIN:  Are you having pain? Yes: NPRS scale: 2/10 Pain location: neck and shoulders (from spinal stenosis) Pain description: throbbing Aggravating factors: weather Relieving factors: cortisone shots from [facility]  PRECAUTIONS: Fall  VITALS Vitals:   04/10/22 1112  BP: (!) 156/74  Pulse: 93      WEIGHT BEARING RESTRICTIONS: No  FALLS: Has patient fallen in last 6 months? No  LIVING ENVIRONMENT: Lives with: lives with their spouse Lives in: House/apartment Stairs: Yes: Internal: 2 steps; on right going up and External: 6 steps; none Has following equipment at home: Quad cane large base, Environmental consultant - 2 wheeled, Environmental consultant - 4 wheeled, Wheelchair (manual), bed side commode, Grab bars, and Ramped entry  PLOF: Independent with gait, Independent with transfers, and Requires assistive device for independence  PATIENT GOALS: "get rid of that thing (rollator) and progress to walking with a cane", "get stronger in my legs", "be able to stand and balance without holding onto anything to be able to get back to cooking"  OBJECTIVE:   DIAGNOSTIC FINDINGS:  Brain MRI 07/11/21 IMPRESSION:   MRI brain (with and without) demonstrating: -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 -Chronic ischemic infarction within the right occipital region with associated  hemosiderin deposition. -Multiple scattered chronic cerebral microhemorrhages. -Moderate chronic small vessel ischemic disease. -No acute findings.  No evidence of underlying vascular malformations or mass lesions.   -Compared to prior MRI from 03/10/2021, expected evolution of changes noted from prior infarctions.   TODAY'S TREATMENT:                 Ther Act  Assessed BP (see above) and although elevated, within limits for PT   Ther Ex  SciFit multi-peaks level 7 for 8 minutes using BUE/BLEs for  dynamic cardiovascular conditioning and global strength. Noted abduction of L knee throughout as pt unable to maintain proper knee position 2/2 weakness. Pt able to maintain grip w/LUE throughout. RPE of 9/10 following activity.   NMR  In // bars for improved BLE strength, lateral weight shifting and single leg stability:  Alt toe taps to 4" step w/BUE support, x20 per side. Min cues to maintain breathing (pt has tendency to hold breath) and to avoid locking out L knee when tapping w/R foot. Pt unable to maintain proper knee position of LLE unless holding breath.  Lateral monster walks w/red band tied around distal quads, down and back x2 w/heavy BUE support. Min cues to maintain space between feet and to avoid dragging LLE. Pt able to perform well, no instability noted Fwd/retro monster walks w/red theraband tied around distal quads, x2 down and back. Pt w/significant difficulty w/retro stepping of LLE, requiring 2-3 small steps to bring L foot backward. RPE of 5/10 following activity.   PATIENT EDUCATION: Education details: Continue HEP Person educated: Patient Education method: Explanation, Demonstration, and Handouts Education comprehension: verbalized understanding, returned demonstration, and needs further education  HOME EXERCISE PROGRAM: Access Code: [email or web address] URL: [email or web address] Date: 04/03/2022 Prepared by: Excell Seltzer  Exercises - Sit to Stand Without Arm  Support  - 1 x daily - 7 x weekly - 3 sets - 10 reps - Mini Squat with Counter Support  - 1 x daily - 7 x weekly - 3 sets - 10 reps - Standing Hip Extension with Unilateral Counter Support  - 1 x daily - 7 x weekly - 3 sets -  10 reps - Standing Hip Abduction with Unilateral Counter Support  - 1 x daily - 7 x weekly - 3 sets - 10 reps  GOALS: Goals reviewed with patient? Yes  SHORT TERM GOALS: Target date: 04/27/2022   Pt will be independent with initial HEP for improved strength, balance, transfers and gait. Baseline: Goal status: INITIAL  2.  Pt will improve Berg score to 26/56 for decreased fall risk Baseline: 22/56 (1/16) Goal status: INITIAL  3.  Pt will improve normal TUG to less than or equal to 35 seconds for improved functional mobility and decreased fall risk. Baseline: 43.31 sec (12/28) Goal status: INITIAL  4.  Pt will improve gait velocity to at least 1.25 ft/sec for improved gait efficiency and performance at mod I level  Baseline: 1.05 ft/sec (12/28) Goal status: INITIAL   LONG TERM GOALS: Target date: 05/25/2022     Pt will be independent with final HEP for improved strength, balance, transfers and gait. Baseline:  Goal status: INITIAL  2.  Pt will improve Berg score to 30/56 for decreased fall risk Baseline: 22/56 (1/16) Goal status: INITIAL  3.  Pt will improve normal TUG to less than or equal to 30 seconds for improved functional mobility and decreased fall risk. Baseline: 43.31 sec (12/28) Goal status: INITIAL  4.  Pt will improve gait velocity to at least 1.5 ft/sec for improved gait efficiency and performance at mod I level  Baseline: 1.05 ft/sec (12/28) Goal status: INITIAL  5.  Pt will improve 5 x STS to less than or equal to 30 seconds to demonstrate improved functional strength and transfer efficiency.  Baseline: 36.59 sec (12/28) Goal status: INITIAL  6.  Pt will improve his score on the 2MWT to 200 ft with LRAD at mod I level Baseline: 164 ft  with rollator at Supervision level (1/16) Goal status: INITIAL   ASSESSMENT:  CLINICAL IMPRESSION: Emphasis of skilled PT session on BLE strength, single leg stability and lateral weight shifting. Pt very fatigued today and moving very slowly compared to previous session, but pt agreeable to PT. Pt continues to require mod cues to avoid valsalva during activity. Pt most challenged by retro stepping w/LLE and relies heavily on BUE support for mobility. Pt also has tendency to lock out L knee w/activity despite cues. Continue POC.    OBJECTIVE IMPAIRMENTS: Abnormal gait, cardiopulmonary status limiting activity, decreased activity tolerance, decreased balance, decreased endurance, decreased knowledge of use of DME, decreased mobility, difficulty walking, decreased strength, impaired perceived functional ability, impaired sensation, and impaired UE functional use.   ACTIVITY LIMITATIONS: carrying, lifting, bending, standing, squatting, stairs, transfers, and reach over head  PARTICIPATION LIMITATIONS: meal prep, driving, and community activity  PERSONAL FACTORS: Fitness, Transportation, and 1-2 comorbidities:   DM2, HTN are also affecting patient's functional outcome.   REHAB POTENTIAL: Good  CLINICAL DECISION MAKING: Stable/uncomplicated  EVALUATION COMPLEXITY: Moderate  PLAN:  PT FREQUENCY: 2x/week  PT DURATION: 8 weeks (certification for 12 wks to allow for scheduling delays)  PLANNED INTERVENTIONS: Therapeutic exercises, Therapeutic activity, Neuromuscular re-education, Balance training, Gait training, Patient/Family education, Self Care, Joint mobilization, Stair training, Vestibular training, Canalith repositioning, Visual/preceptual remediation/compensation, Orthotic/Fit training, DME instructions, Aquatic Therapy, Dry Needling, Electrical stimulation, Wheelchair mobility training, Cryotherapy, Moist heat, Taping, Manual therapy, and Re-evaluation  PLAN FOR NEXT SESSION: check  BP!, add to HEP for LE strength, balance, endurance (walking program?), practice gait with quad cane, work on fall recovery when safe and able (floor transfer), hip strength, lateral weight  shifting    Jill Alexanders Hevin Jeffcoat, PT, DPT 04/10/2022, 11:59 AM

## 2022-04-12 ENCOUNTER — Ambulatory Visit: Payer: BC Managed Care – PPO | Admitting: Physical Therapy

## 2022-04-12 VITALS — BP 158/60 | HR 60

## 2022-04-12 DIAGNOSIS — M6281 Muscle weakness (generalized): Secondary | ICD-10-CM

## 2022-04-12 DIAGNOSIS — R2681 Unsteadiness on feet: Secondary | ICD-10-CM

## 2022-04-12 DIAGNOSIS — I69354 Hemiplegia and hemiparesis following cerebral infarction affecting left non-dominant side: Secondary | ICD-10-CM

## 2022-04-12 NOTE — Therapy (Signed)
OUTPATIENT PHYSICAL THERAPY NEURO TREATMENT   Patient Name: Joseph Ortega MRN: [id number] DOB:Dec 26, 1955, 67 y.o., male Today's Date: 04/12/2022   PCP: [facility] REFERRING PROVIDER: Meredith Staggers, MD  END OF SESSION:  PT End of Session - 04/12/22 1107     Visit Number 5    Number of Visits 24   with eval   Date for PT Re-Evaluation 06/07/22    Authorization Type Blue [employer] Blue [employer]    Progress Note Due on Visit 10    PT Start Time 1103    PT Stop Time 1145    PT Time Calculation (min) 42 min    Equipment Utilized During Treatment Gait belt    Activity Tolerance Patient limited by fatigue    Behavior During Therapy WFL for tasks assessed/performed                 Past Medical History:  Diagnosis Date   Diabetes mellitus without complication ([place])    Hypertension    Thyroid disease    Past Surgical History:  Procedure Laterality Date   HERNIA REPAIR     IR ANGIO INTRA EXTRACRAN SEL COM CAROTID INNOMINATE BILAT MOD SED  03/09/2021   IR ANGIO VERTEBRAL SEL VERTEBRAL BILAT MOD SED  03/09/2021   IR US GUIDE VASC ACCESS RIGHT  03/09/2021   Patient Active Problem List   Diagnosis Date Noted   ICH (intracerebral hemorrhage) ([place]) 03/17/2021   Hypothyroidism 03/12/2021   OSA (obstructive sleep apnea) 03/12/2021   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   Uncontrolled type 2 diabetes mellitus with hyperglycemia ([place]) 03/12/2021   Redness of both eyes 03/12/2021   Morbid obesity ([place]) 03/11/2021   Essential hypertension 03/11/2021   Tobacco abuse 03/11/2021   Herpes zoster with complication    SAH (subarachnoid hemorrhage) ([place]) 03/08/2021    ONSET DATE: 01/19/2022  REFERRING DIAG: I61.1 (ICD-10-CM) - Nontraumatic cortical hemorrhage of right cerebral hemisphere (HCC)  THERAPY DIAG:  Hemiplegia and hemiparesis following cerebral infarction affecting left non-dominant side (HCC)  Unsteadiness on feet  Muscle weakness (generalized)  Rationale for  Evaluation and Treatment: Rehabilitation  SUBJECTIVE:                                                                                                                                                                                             SUBJECTIVE STATEMENT: Pt reports he is still fatigued today. Having some pain in his neck and shoulder. Reports had a nose bleed yesterday and a lingering headache.   Pt also reports that he has an HEP from CIR and from [place]; working on a few balance exercises.  Pt accompanied by: self  PERTINENT HISTORY: DM2, HTN  PAIN:  Are you having pain? Yes: NPRS scale: 3/10 Pain location: neck and shoulders (from spinal stenosis) Pain description: throbbing Aggravating factors: weather Relieving factors: cortisone shots from [facility]  PRECAUTIONS: Fall  VITALS Vitals:   04/12/22 1109 04/12/22 1114  BP: (!) 175/74 (!) 158/60  Pulse: 64 60      WEIGHT BEARING RESTRICTIONS: No  FALLS: Has patient fallen in last 6 months? No  LIVING ENVIRONMENT: Lives with: lives with their spouse Lives in: House/apartment Stairs: Yes: Internal: 2 steps; on right going up and External: 6 steps; none Has following equipment at home: Quad cane large base, Environmental consultant - 2 wheeled, Environmental consultant - 4 wheeled, Wheelchair (manual), bed side commode, Grab bars, and Ramped entry  PLOF: Independent with gait, Independent with transfers, and Requires assistive device for independence  PATIENT GOALS: "get rid of that thing (rollator) and progress to walking with a cane", "get stronger in my legs", "be able to stand and balance without holding onto anything to be able to get back to cooking"  OBJECTIVE:   DIAGNOSTIC FINDINGS:  Brain MRI 07/11/21 IMPRESSION:   MRI brain (with and without) demonstrating: -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 -Chronic ischemic infarction  within the right occipital region with associated hemosiderin deposition. -Multiple scattered chronic cerebral microhemorrhages. -Moderate chronic small vessel ischemic disease. -No acute findings.  No evidence of underlying vascular malformations or mass lesions.   -Compared to prior MRI from 03/10/2021, expected evolution of changes noted from prior infarctions.   TODAY'S TREATMENT:                 Ther Act  Assessed BP (see above) and initially elevated but lowered w/2 minutes of seated rest, so within limits for PT   NMR  In // bars for improved BLE strength, lateral weight shifting and single leg stability:  Alt fwd step w/retreat over 4" hurdle w/BUE support, x10 per side. Pt had significant difficulty performing on L side without circumduction.  Lateral alt step over 4" hurdle w/retreat, x15 per side w/BUE support. Pt w/increased difficulty performing adduction >abduction of LLE and relied heavily on circumduction and R trunk lean to perform. When performing w/RLE, min cues to avoid hyperextension of L knee in stance. Min cues to maintain breathing w/activity, as pt tends to hold breath.    Ther Ex  SciFit multi-peaks level 5 for 8 minutes using BUE/BLEs for  dynamic cardiovascular conditioning and global strength. Noted abduction of L knee throughout as pt unable to maintain proper knee position 2/2 weakness. Pt able to maintain grip w/LUE throughout. RPE of 6/10 and rated knee pain as 4/10  following activity.   PATIENT EDUCATION: Education details: Continue HEP Person educated: Patient Education method: Explanation, Demonstration, and Handouts Education comprehension: verbalized understanding, returned demonstration, and needs further education  HOME EXERCISE PROGRAM: Access Code: [email or web address] URL: [email or web address] Date: 04/03/2022 Prepared by: Excell Seltzer  Exercises - Sit to Stand Without Arm Support  - 1 x daily - 7 x weekly - 3 sets - 10 reps - Mini  Squat with Counter Support  - 1 x daily - 7 x weekly - 3 sets - 10 reps - Standing Hip Extension with Unilateral Counter Support  - 1 x daily - 7 x weekly - 3 sets - 10 reps - Standing Hip Abduction with Unilateral Counter Support  - 1 x daily -  7 x weekly - 3 sets - 10 reps  GOALS: Goals reviewed with patient? Yes  SHORT TERM GOALS: Target date: 04/27/2022   Pt will be independent with initial HEP for improved strength, balance, transfers and gait. Baseline: Goal status: INITIAL  2.  Pt will improve Berg score to 26/56 for decreased fall risk Baseline: 22/56 (1/16) Goal status: INITIAL  3.  Pt will improve normal TUG to less than or equal to 35 seconds for improved functional mobility and decreased fall risk. Baseline: 43.31 sec (12/28) Goal status: INITIAL  4.  Pt will improve gait velocity to at least 1.25 ft/sec for improved gait efficiency and performance at mod I level  Baseline: 1.05 ft/sec (12/28) Goal status: INITIAL   LONG TERM GOALS: Target date: 05/25/2022     Pt will be independent with final HEP for improved strength, balance, transfers and gait. Baseline:  Goal status: INITIAL  2.  Pt will improve Berg score to 30/56 for decreased fall risk Baseline: 22/56 (1/16) Goal status: INITIAL  3.  Pt will improve normal TUG to less than or equal to 30 seconds for improved functional mobility and decreased fall risk. Baseline: 43.31 sec (12/28) Goal status: INITIAL  4.  Pt will improve gait velocity to at least 1.5 ft/sec for improved gait efficiency and performance at mod I level  Baseline: 1.05 ft/sec (12/28) Goal status: INITIAL  5.  Pt will improve 5 x STS to less than or equal to 30 seconds to demonstrate improved functional strength and transfer efficiency.  Baseline: 36.59 sec (12/28) Goal status: INITIAL  6.  Pt will improve his score on the to 200 ft with LRAD at mod I level Baseline: 164 ft with rollator at Supervision level (1/16) Goal status:  INITIAL   ASSESSMENT:  CLINICAL IMPRESSION: Emphasis of skilled PT session on single leg stability, hip flexor strength and endurance. Pt continues to be fatigued this date and reported headache and nose bleed day prior. Strongly encouraged pt to closely monitor BP at home as it continues to be elevated. Pt requires mod A to prevent valsalva during activity and to maintain proper L knee position when in stance to prevent hyperextension. Pt continues to be limited by L glute/hip weakness and decreased activity tolerance. Continue POC.    OBJECTIVE IMPAIRMENTS: Abnormal gait, cardiopulmonary status limiting activity, decreased activity tolerance, decreased balance, decreased endurance, decreased knowledge of use of DME, decreased mobility, difficulty walking, decreased strength, impaired perceived functional ability, impaired sensation, and impaired UE functional use.   ACTIVITY LIMITATIONS: carrying, lifting, bending, standing, squatting, stairs, transfers, and reach over head  PARTICIPATION LIMITATIONS: meal prep, driving, and community activity  PERSONAL FACTORS: Fitness, Transportation, and 1-2 comorbidities:   DM2, HTN are also affecting patient's functional outcome.   REHAB POTENTIAL: Good  CLINICAL DECISION MAKING: Stable/uncomplicated  EVALUATION COMPLEXITY: Moderate  PLAN:  PT FREQUENCY: 2x/week  PT DURATION: 8 weeks (certification for 12 wks to allow for scheduling delays)  PLANNED INTERVENTIONS: Therapeutic exercises, Therapeutic activity, Neuromuscular re-education, Balance training, Gait training, Patient/Family education, Self Care, Joint mobilization, Stair training, Vestibular training, Canalith repositioning, Visual/preceptual remediation/compensation, Orthotic/Fit training, DME instructions, Aquatic Therapy, Dry Needling, Electrical stimulation, Wheelchair mobility training, Cryotherapy, Moist heat, Taping, Manual therapy, and Re-evaluation  PLAN FOR NEXT SESSION: check  BP!, add to HEP for LE strength, balance, endurance (walking program?), practice gait with quad cane, work on fall recovery when safe and able (floor transfer), hip strength, lateral weight shifting    Jaretssi Kraker E Sherry Blackard,  PT, DPT 04/12/2022, 11:46 AM

## 2022-04-17 ENCOUNTER — Encounter: Payer: Self-pay | Admitting: Occupational Therapy

## 2022-04-17 ENCOUNTER — Ambulatory Visit: Payer: BC Managed Care – PPO | Admitting: Occupational Therapy

## 2022-04-17 ENCOUNTER — Ambulatory Visit: Payer: BC Managed Care – PPO | Admitting: Physical Therapy

## 2022-04-17 VITALS — BP 186/82 | HR 49

## 2022-04-17 DIAGNOSIS — M6281 Muscle weakness (generalized): Secondary | ICD-10-CM | POA: Diagnosis not present

## 2022-04-17 DIAGNOSIS — I69354 Hemiplegia and hemiparesis following cerebral infarction affecting left non-dominant side: Secondary | ICD-10-CM

## 2022-04-17 DIAGNOSIS — I611 Nontraumatic intracerebral hemorrhage in hemisphere, cortical: Secondary | ICD-10-CM

## 2022-04-17 DIAGNOSIS — R600 Localized edema: Secondary | ICD-10-CM

## 2022-04-17 DIAGNOSIS — G8929 Other chronic pain: Secondary | ICD-10-CM

## 2022-04-17 DIAGNOSIS — R2681 Unsteadiness on feet: Secondary | ICD-10-CM

## 2022-04-17 DIAGNOSIS — R278 Other lack of coordination: Secondary | ICD-10-CM

## 2022-04-17 NOTE — Therapy (Signed)
OUTPATIENT PHYSICAL THERAPY NEURO TREATMENT- ARRIVED NO CHARGE   Patient Name: Joseph Ortega MRN: [id number] DOB:[birth year], 67 y.o., male Today's Date: 04/17/2022   PCP: [facility] REFERRING PROVIDER: Meredith Staggers, MD  END OF SESSION:  PT End of Session - 04/17/22 1154     Visit Number 5   Arrived no charge   Number of Visits 109   with eval   Date for PT Re-Evaluation 06/07/22    Authorization Type Blue Cross Blue Shield    Progress Note Due on Visit 10    PT Start Time 1148    PT Stop Time 1206    PT Time Calculation (min) 18 min    Equipment Utilized During Treatment --    Activity Tolerance Patient limited by fatigue;Treatment limited secondary to medical complications (Comment)   Hypertension   Behavior During Therapy Flat affect                  Past Medical History:  Diagnosis Date   Diabetes mellitus without complication ([place])    Hypertension    Thyroid disease    Past Surgical History:  Procedure Laterality Date   HERNIA REPAIR     IR ANGIO INTRA EXTRACRAN SEL COM CAROTID INNOMINATE BILAT MOD SED  03/09/2021   IR ANGIO VERTEBRAL SEL VERTEBRAL BILAT MOD SED  03/09/2021   IR US GUIDE VASC ACCESS RIGHT  03/09/2021   Patient Active Problem List   Diagnosis Date Noted   ICH (intracerebral hemorrhage) ([place]) 03/17/2021   Hypothyroidism 03/12/2021   OSA (obstructive sleep apnea) 03/12/2021   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   Uncontrolled type 2 diabetes mellitus with hyperglycemia ([place]) 03/12/2021   Redness of both eyes 03/12/2021   Morbid obesity ([place]) 03/11/2021   Essential hypertension 03/11/2021   Tobacco abuse 03/11/2021   Herpes zoster with complication    SAH (subarachnoid hemorrhage) ([place]) 03/08/2021    ONSET DATE: 01/19/2022  REFERRING DIAG: I61.1 (ICD-10-CM) - Nontraumatic cortical hemorrhage of right cerebral hemisphere (HCC)  THERAPY DIAG:  Hemiplegia and hemiparesis following cerebral infarction affecting left  non-dominant side (HCC)  Unsteadiness on feet  Muscle weakness (generalized)  Rationale for Evaluation and Treatment: Rehabilitation  SUBJECTIVE:                                                                                                                                                                                             SUBJECTIVE STATEMENT: Pt reports he is still fatigued today, wife is not sleeping so he is not sleeping well. No falls.   Pt also reports that he has an HEP from CIR and from  [facility]; working on a few balance exercises.   Pt accompanied by: self  PERTINENT HISTORY: DM2, HTN  PAIN:  Are you having pain? Yes: NPRS scale: 3/10 Pain location: neck and shoulders (from spinal stenosis) Pain description: throbbing Aggravating factors: weather Relieving factors: cortisone shots from [facility]  PRECAUTIONS: Fall  VITALS Vitals:   04/17/22 1156 04/17/22 1203  BP: (!) 188/91 (!) 186/82  Pulse: (!) 52 (!) 49      WEIGHT BEARING RESTRICTIONS: No  FALLS: Has patient fallen in last 6 months? No  LIVING ENVIRONMENT: Lives with: lives with their spouse Lives in: House/apartment Stairs: Yes: Internal: 2 steps; on right going up and External: 6 steps; none Has following equipment at home: Quad cane large base, Environmental consultant - 2 wheeled, Environmental consultant - 4 wheeled, Wheelchair (manual), bed side commode, Grab bars, and Ramped entry  PLOF: Independent with gait, Independent with transfers, and Requires assistive device for independence  PATIENT GOALS: "get rid of that thing (rollator) and progress to walking with a cane", "get stronger in my legs", "be able to stand and balance without holding onto anything to be able to get back to cooking"  OBJECTIVE:   DIAGNOSTIC FINDINGS:  Brain MRI 07/11/21 IMPRESSION:   MRI brain (with and without) demonstrating: -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 -Chronic ischemic infarction within the right occipital region with associated hemosiderin deposition. -Multiple scattered chronic cerebral microhemorrhages. -Moderate chronic small vessel ischemic disease. -No acute findings.  No evidence of underlying vascular malformations or mass lesions.   -Compared to prior MRI from 03/10/2021, expected evolution of changes noted from prior infarctions.   TODAY'S TREATMENT:                 Ther Act  Assessed BP (see above) and pt's systolic BP too elevated for PT. Pt reports he is exhausted and would prefer to go home and rest. Arrived no charge due to pt fatigue and elevated BP.    PATIENT EDUCATION: Education details: Continue HEP, next appointment time  Person educated: Patient Education method: Explanation, Demonstration, and Handouts Education comprehension: verbalized understanding, returned demonstration, and needs further education  HOME EXERCISE PROGRAM: Access Code: [email or web address] URL: [email or web address] Date: 04/03/2022 Prepared by: Excell Seltzer  Exercises - Sit to Stand Without Arm Support  - 1 x daily - 7 x weekly - 3 sets - 10 reps - Mini Squat with Counter Support  - 1 x daily - 7 x weekly - 3 sets - 10 reps - Standing Hip Extension with Unilateral Counter Support  - 1 x daily - 7 x weekly - 3 sets - 10 reps - Standing Hip Abduction with Unilateral Counter Support  - 1 x daily - 7 x weekly - 3 sets - 10 reps  GOALS: Goals reviewed with patient? Yes  SHORT TERM GOALS: Target date: 04/27/2022   Pt will be independent with initial HEP for improved strength, balance, transfers and gait. Baseline: Goal status: INITIAL  2.  Pt will improve Berg score to 26/56 for decreased fall risk Baseline: 22/56 (1/16) Goal status: INITIAL  3.  Pt will improve normal TUG to less than or equal to 35 seconds for improved functional mobility and decreased fall risk. Baseline: 43.31 sec  (12/28) Goal status: INITIAL  4.  Pt will improve gait velocity to at least 1.25 ft/sec for improved gait efficiency and performance at mod I level  Baseline: 1.05 ft/sec (12/28) Goal  status: INITIAL   LONG TERM GOALS: Target date: 05/25/2022     Pt will be independent with final HEP for improved strength, balance, transfers and gait. Baseline:  Goal status: INITIAL  2.  Pt will improve Berg score to 30/56 for decreased fall risk Baseline: 22/56 (1/16) Goal status: INITIAL  3.  Pt will improve normal TUG to less than or equal to 30 seconds for improved functional mobility and decreased fall risk. Baseline: 43.31 sec (12/28) Goal status: INITIAL  4.  Pt will improve gait velocity to at least 1.5 ft/sec for improved gait efficiency and performance at mod I level  Baseline: 1.05 ft/sec (12/28) Goal status: INITIAL  5.  Pt will improve 5 x STS to less than or equal to 30 seconds to demonstrate improved functional strength and transfer efficiency.  Baseline: 36.59 sec (12/28) Goal status: INITIAL  6.  Pt will improve his score on the 2MWT to 200 ft with LRAD at mod I level Baseline: 164 ft with rollator at Supervision level (1/16) Goal status: INITIAL   ASSESSMENT:  CLINICAL IMPRESSION: Arrived no charge due to hypertension.    OBJECTIVE IMPAIRMENTS: Abnormal gait, cardiopulmonary status limiting activity, decreased activity tolerance, decreased balance, decreased endurance, decreased knowledge of use of DME, decreased mobility, difficulty walking, decreased strength, impaired perceived functional ability, impaired sensation, and impaired UE functional use.   ACTIVITY LIMITATIONS: carrying, lifting, bending, standing, squatting, stairs, transfers, and reach over head  PARTICIPATION LIMITATIONS: meal prep, driving, and community activity  PERSONAL FACTORS: Fitness, Transportation, and 1-2 comorbidities:   DM2, HTN are also affecting patient's functional outcome.   REHAB  POTENTIAL: Good  CLINICAL DECISION MAKING: Stable/uncomplicated  EVALUATION COMPLEXITY: Moderate  PLAN:  PT FREQUENCY: 2x/week  PT DURATION: 8 weeks (certification for 12 wks to allow for scheduling delays)  PLANNED INTERVENTIONS: Therapeutic exercises, Therapeutic activity, Neuromuscular re-education, Balance training, Gait training, Patient/Family education, Self Care, Joint mobilization, Stair training, Vestibular training, Canalith repositioning, Visual/preceptual remediation/compensation, Orthotic/Fit training, DME instructions, Aquatic Therapy, Dry Needling, Electrical stimulation, Wheelchair mobility training, Cryotherapy, Moist heat, Taping, Manual therapy, and Re-evaluation  PLAN FOR NEXT SESSION: check BP!, add to HEP for LE strength, balance, endurance (walking program?), practice gait with quad cane, work on fall recovery when safe and able (floor transfer), hip strength, lateral weight shifting    Eleri Ruben E Abbygale Lapid, PT, DPT 04/17/2022, 12:09 PM

## 2022-04-17 NOTE — Patient Instructions (Addendum)
     Heat Home Program  Heat is used as part of your therapy for several reasons.  Heat increases blood flow, which promotes healing. Heat also relaxes muscles and joints which makes exercising easier.  It can be applied for __10-15_ minutes,  _up to 3_ sessions per day  Use one of the following methods that is most convenient for you  Heating pad: Follow instructions given by manufacturer.  Use on low-medium setting.    Moist heated towel: Soak towel in hot water or place damp towel in microwave and heat for 30 sec. Check temperature to determine if further time needed.  Wring out towel and wrap around affected area, cover with a plastic bag.  Can also wrap a dry towel around the plastic to help retain the heat.    Microwave gel pack: Follow instructions given by manufacturer.  Check temperature before application to ensure not hot enough to cause a burn    Rice sock: This can be made using a sock with no synthetic material and 1.5 cups of rice.  Pour the rice into the sock, tie a knot at the top.  Place in microwave for 1 minute, remove to check temperature to determine if further heating time is required prior to application  Be sure to check the skin periodically to ensure no excessive redness or blisters.  Use with caution in areas with decreased sensation 

## 2022-04-17 NOTE — Therapy (Signed)
OUTPATIENT OCCUPATIONAL THERAPY NEURO TREATMENT  Patient Name: Joseph Ortega MRN: [id number] DOB:April 13, 1955, 67 y.o., male Today's Date: 04/17/2022  PCP: Dr. Cristie Hem REFERRING PROVIDER: Meredith Staggers, MD  END OF SESSION:  OT End of Session - 04/17/22 1059     Visit Number 2    Number of Visits 13    Date for OT Re-Evaluation 05/25/22    Authorization Type BC/BS, VL: 30 Combined    OT Start Time 1108    OT Stop Time 1146    OT Time Calculation (min) 38 min    Activity Tolerance Patient tolerated treatment well    Behavior During Therapy WFL for tasks assessed/performed              Past Medical History:  Diagnosis Date   Diabetes mellitus without complication ([place])    Hypertension    Thyroid disease    Past Surgical History:  Procedure Laterality Date   HERNIA REPAIR     IR ANGIO INTRA EXTRACRAN SEL COM CAROTID INNOMINATE BILAT MOD SED  03/09/2021   IR ANGIO VERTEBRAL SEL VERTEBRAL BILAT MOD SED  03/09/2021   IR US GUIDE VASC ACCESS RIGHT  03/09/2021   Patient Active Problem List   Diagnosis Date Noted   ICH (intracerebral hemorrhage) ([place]) 03/17/2021   Hypothyroidism 03/12/2021   OSA (obstructive sleep apnea) 03/12/2021   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   Uncontrolled type 2 diabetes mellitus with hyperglycemia ([place]) 03/12/2021   Redness of both eyes 03/12/2021   Morbid obesity ([place]) 03/11/2021   Essential hypertension 03/11/2021   Tobacco abuse 03/11/2021   Herpes zoster with complication    SAH (subarachnoid hemorrhage) ([place]) 03/08/2021    ONSET DATE: 02/28/2022 Referral date  REFERRING DIAG:  Diagnosis  I61.1 (ICD-10-CM) - Nontraumatic cortical hemorrhage of right cerebral hemisphere ([place])   Note from referral: DX: Right frontal infarct with left heimparesis RX: Eval and treat, pt is already scheduled. Please assess for vivistim  THERAPY DIAG:  Hemiplegia and hemiparesis following cerebral infarction affecting left non-dominant side  (HCC)  Muscle weakness (generalized)  Other lack of coordination  Localized edema  Chronic left shoulder pain  Nontraumatic cortical hemorrhage of right cerebral hemisphere [facility])  Rationale for Evaluation and Treatment: Rehabilitation  SUBJECTIVE:   SUBJECTIVE STATEMENT: He has not been given HEP for his LUE.   Pt accompanied by: self  PERTINENT HISTORY: CVA 02/2021, DM2, HTN  PRECAUTIONS: Fall and Other: no heavy lifting overhead, ? driving  WEIGHT BEARING RESTRICTIONS: No  PAIN:  Are you having pain? Yes: NPRS scale: 3/10 Pain location: neck; LUE if he tries to raise it Pain description: throbbing Aggravating factors: weather Relieving factors: rest  FALLS: Has patient fallen in last 6 months? 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, likes to cook  PATIENT GOALS: get my Lt arm working  OBJECTIVE:   HAND DOMINANCE: Right  ADLs: Transfers/ambulation related to ADLs: Eating: assist to cut food Grooming: mod I  UB Dressing: mod I  LB Dressing: mod I w/ elastic pants and  velcro shoes, occasional assist for socks Toileting: mod I  Bathing: min assist for washing back, rinsing  Tub Shower transfers: mod I w/ walk in shower, 2 grab bars Equipment:  see above  IADLs: Shopping: mod assist Light housekeeping: pt does laundry but needs assist to fold Meal Prep: max assist for cooking Community mobility: pt drives short distances, relies on others for longer  distances Medication management: independent Handwriting:  denies change  MOBILITY STATUS:  rollator in community, RW in house   FUNCTIONAL OUTCOME MEASURES: Upper Extremity Functional Scale (UEFS): 43.75% of 80%  UPPER EXTREMITY ROM:  RUE AROM WNL's.  LUE: Dominated by synergy pattern w/ min shoulder movement going into abduction w/ no distal control. However distally (isolated), pt has supination to neutral, 75% wrist ext, 50% full composite flex, and 90% full composite ext.   HAND FUNCTION: Grip strength: Right: 84.6 lbs; Left: 23.3 lbs  COORDINATION: Pt could pick up block from lower surface w/ gross grasp, but unable to do Box & Blocks d/t limited shoulder movement  SENSATION: WFL  EDEMA: min to moderate Lt hand  MUSCLE TONE: LUE: Hypotonic  COGNITION: Overall cognitive status:  pt reports short term memory deficits since stroke  VISION: Subjective report: I had diplopia but that resolved.  Baseline vision: Wears glasses for reading only Visual history: retinopathy and corrective eye surgery  VISION ASSESSMENT: Decreased Lt lower quadrant peripheral vision    PERCEPTION: Not tested, Did require cueing to let go of walker with Lt hand.    OBSERVATIONS: Pt's stroke was hemorrhagic - therefore pt does not qualify for vivistim at this time   TODAY'S TREATMENT:                                                                                                                              - Therapeutic exercises completed for duration as noted below including:  OT initiated L shoulder flexion and abduction table slides as well as shoulder retractions for improved ROM. Heat HEP also initiated to be completed prior to stretching for better pain management and ROM.   Dowel table lifts with cues to keep arm movements symmetrical. Elbow flexion  and wrist extension stretches with dowel as well for improved ROM. Cues not to hold breath with repetitions.   Pt completed  alternative prayer stretch for wrist extension ROM as well.  PATIENT EDUCATION: Education details: L shoulder table slides; heat HEP Person educated: Patient Education method: Explanation, Demonstration, and Handouts Education comprehension: verbalized understanding, returned demonstration, and needs further education  HOME EXERCISE PROGRAM: N/A   GOALS: Goals reviewed with patient? Yes  SHORT TERM GOALS: Target date: 05/04/22  Pt will be independent with initial HEP for LUE Baseline: not yet issued Goal status: INITIAL  2.  Pt will verbalize understanding of A/E needs to increase independence with donning socks, cooking, etc Baseline: not yet addressed Goal status: INITIAL  3.  Pt will consistently don socks I'ly Baseline:  occasional assist required Goal status: INITIAL  4. Pt will verbalize understanding with memory compensatory strategies  Baseline: not yet issued  Goal status: INITIAL    LONG TERM GOALS: Target date: 05/25/22  Pt will be independent with updated HEP for LUE Baseline: not yet addressed Goal status: INITIAL  2.  Pt will improve LUE function as being able to perform 5 blocks on Box & Blocks test Baseline: unable  Goal status: INITIAL  3.  Pt will improve UEFS from 43% to 46% or higher  Baseline: 43% Goal status: INITIAL  4.  Pt will be able to prepare simple meal I'ly  Baseline: needs assist Goal status: INITIAL  5.  Pt will demo 30* sh flexion LUE for low level reaching Baseline: no true sh flex, approx 60* abduction w/ no distal control Goal status: INITIAL   ASSESSMENT:  CLINICAL IMPRESSION: Pt would benefit from outpatient O.T. to address these deficits and maximize function and safety w/ ADLS.   PERFORMANCE DEFICITS: in functional skills including ADLs, IADLs, coordination, edema, tone, ROM, strength, pain, Gross motor control, mobility, balance, body mechanics, endurance, decreased knowledge of use of DME, vision, and UE functional  use, cognitive skills including memory,  IMPAIRMENTS: are limiting patient from ADLs and IADLs.   CO-MORBIDITIES: may have co-morbidities  that affects occupational performance. Patient will benefit from skilled OT to address above impairments and improve overall function.  REHAB POTENTIAL: Fair time since onset  PLAN:  OT FREQUENCY: 2x/week  OT DURATION: 6 weeks  PLANNED INTERVENTIONS: self care/ADL training, therapeutic exercise, therapeutic activity, neuromuscular re-education, manual therapy, passive range of motion, functional mobility training, splinting, electrical stimulation, moist heat, cryotherapy, contrast bath, patient/family education, cognitive remediation/compensation, visual/perceptual remediation/compensation, energy conservation, and DME and/or AE instructions  RECOMMENDED OTHER SERVICES: none at this time  CONSULTED AND AGREED WITH PLAN OF CARE: Patient  PLAN FOR NEXT SESSION: Review HEP    Dennis Bast, OT 04/17/2022, 11:41 AM

## 2022-04-19 ENCOUNTER — Ambulatory Visit: Payer: BC Managed Care – PPO | Admitting: Physical Therapy

## 2022-04-23 ENCOUNTER — Other Ambulatory Visit: Payer: Self-pay | Admitting: Registered Nurse

## 2022-04-24 ENCOUNTER — Ambulatory Visit: Payer: BC Managed Care – PPO | Admitting: Physical Therapy

## 2022-04-24 NOTE — Therapy (Incomplete)
OUTPATIENT PHYSICAL THERAPY NEURO TREATMENT   Patient Name: Joseph Ortega MRN: [id number] DOB:10/10/1955, 66 y.o., male Today's Date: 04/24/2022   PCP: [facility] REFERRING PROVIDER: Meredith Staggers, MD  END OF SESSION:        Past Medical History:  Diagnosis Date   Diabetes mellitus without complication ([place])    Hypertension    Thyroid disease    Past Surgical History:  Procedure Laterality Date   HERNIA REPAIR     IR ANGIO INTRA EXTRACRAN SEL COM CAROTID INNOMINATE BILAT MOD SED  03/09/2021   IR ANGIO VERTEBRAL SEL VERTEBRAL BILAT MOD SED  03/09/2021   IR US GUIDE VASC ACCESS RIGHT  03/09/2021   Patient Active Problem List   Diagnosis Date Noted   ICH (intracerebral hemorrhage) ([place]) 03/17/2021   Hypothyroidism 03/12/2021   OSA (obstructive sleep apnea) 03/12/2021   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   Uncontrolled type 2 diabetes mellitus with hyperglycemia ([place]) 03/12/2021   Redness of both eyes 03/12/2021   Morbid obesity ([place]) 03/11/2021   Essential hypertension 03/11/2021   Tobacco abuse 03/11/2021   Herpes zoster with complication    SAH (subarachnoid hemorrhage) ([place]) 03/08/2021    ONSET DATE: 01/19/2022  REFERRING DIAG: I61.1 (ICD-10-CM) - Nontraumatic cortical hemorrhage of right cerebral hemisphere (HCC)  THERAPY DIAG:  No diagnosis found.  Rationale for Evaluation and Treatment: Rehabilitation  SUBJECTIVE:                                                                                                                                                                                             SUBJECTIVE STATEMENT: ***   Pt accompanied by: self  PERTINENT HISTORY: DM2, HTN  PAIN:  Are you having pain? Yes: NPRS scale: 3/10 Pain location: neck and shoulders (from spinal stenosis) Pain description: throbbing Aggravating factors: weather Relieving factors: cortisone shots from [facility]  PRECAUTIONS:  Fall  VITALS There were no vitals filed for this visit.     WEIGHT BEARING RESTRICTIONS: No  FALLS: Has patient fallen in last 6 months? No  LIVING ENVIRONMENT: Lives with: lives with their spouse Lives in: House/apartment Stairs: Yes: Internal: 2 steps; on right going up and External: 6 steps; none Has following equipment at home: Quad cane large base, Environmental consultant - 2 wheeled, Environmental consultant - 4 wheeled, Wheelchair (manual), bed side commode, Grab bars, and Ramped entry  PLOF: Independent with gait, Independent with transfers, and Requires assistive device for independence  PATIENT GOALS: "get rid of that thing (rollator) and progress to walking with a cane", "get stronger in my legs", "be able to stand and balance without holding onto  anything to be able to get back to cooking"  OBJECTIVE:   DIAGNOSTIC FINDINGS:  Brain MRI 07/11/21 IMPRESSION:   MRI brain (with and without) demonstrating: -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 -Chronic ischemic infarction within the right occipital region with associated hemosiderin deposition. -Multiple scattered chronic cerebral microhemorrhages. -Moderate chronic small vessel ischemic disease. -No acute findings.  No evidence of underlying vascular malformations or mass lesions.   -Compared to prior MRI from 03/10/2021, expected evolution of changes noted from prior infarctions.   TODAY'S TREATMENT:                 Ther Act  Assessed BP (see above) and initially elevated but lowered w/2 minutes of seated rest, so within limits for PT  ***  NMR  In // bars for improved BLE strength, lateral weight shifting and single leg stability:  ***   Ther Ex  SciFit multi-peaks level 5 for 8 minutes using BUE/BLEs for  dynamic cardiovascular conditioning and global strength. Noted abduction of L knee throughout as pt unable to maintain proper knee position 2/2  weakness. Pt able to maintain grip w/LUE throughout. RPE of 6/10 and rated knee pain as 4/10  following activity. ***  PATIENT EDUCATION: Education details: Continue HEP*** Person educated: Patient Education method: Explanation, Demonstration, and Handouts Education comprehension: verbalized understanding, returned demonstration, and needs further education  HOME EXERCISE PROGRAM: Access Code: [email or web address] URL: [email or web address] Date: 04/03/2022 Prepared by: Excell Seltzer  Exercises - Sit to Stand Without Arm Support  - 1 x daily - 7 x weekly - 3 sets - 10 reps - Mini Squat with Counter Support  - 1 x daily - 7 x weekly - 3 sets - 10 reps - Standing Hip Extension with Unilateral Counter Support  - 1 x daily - 7 x weekly - 3 sets - 10 reps - Standing Hip Abduction with Unilateral Counter Support  - 1 x daily - 7 x weekly - 3 sets - 10 reps  GOALS: Goals reviewed with patient? Yes  SHORT TERM GOALS: Target date: 04/27/2022   Pt will be independent with initial HEP for improved strength, balance, transfers and gait. Baseline: Goal status: INITIAL  2.  Pt will improve Berg score to 26/56 for decreased fall risk Baseline: 22/56 (1/16) Goal status: INITIAL  3.  Pt will improve normal TUG to less than or equal to 35 seconds for improved functional mobility and decreased fall risk. Baseline: 43.31 sec (12/28) Goal status: INITIAL  4.  Pt will improve gait velocity to at least 1.25 ft/sec for improved gait efficiency and performance at mod I level  Baseline: 1.05 ft/sec (12/28) Goal status: INITIAL   LONG TERM GOALS: Target date: 05/25/2022     Pt will be independent with final HEP for improved strength, balance, transfers and gait. Baseline:  Goal status: INITIAL  2.  Pt will improve Berg score to 30/56 for decreased fall risk Baseline: 22/56 (1/16) Goal status: INITIAL  3.  Pt will improve normal TUG to less than or equal to 30 seconds for improved functional  mobility and decreased fall risk. Baseline: 43.31 sec (12/28) Goal status: INITIAL  4.  Pt will improve gait velocity to at least 1.5 ft/sec for improved gait efficiency and performance at mod I level  Baseline: 1.05 ft/sec (12/28) Goal status: INITIAL  5.  Pt will improve 5 x STS to less than or equal to 30 seconds to demonstrate  improved functional strength and transfer efficiency.  Baseline: 36.59 sec (12/28) Goal status: INITIAL  6.  Pt will improve his score on the 2MWT to 200 ft with LRAD at mod I level Baseline: 164 ft with rollator at Supervision level (1/16) Goal status: INITIAL   ASSESSMENT:  CLINICAL IMPRESSION: Emphasis of skilled PT session on *** Continue POC.    OBJECTIVE IMPAIRMENTS: Abnormal gait, cardiopulmonary status limiting activity, decreased activity tolerance, decreased balance, decreased endurance, decreased knowledge of use of DME, decreased mobility, difficulty walking, decreased strength, impaired perceived functional ability, impaired sensation, and impaired UE functional use.   ACTIVITY LIMITATIONS: carrying, lifting, bending, standing, squatting, stairs, transfers, and reach over head  PARTICIPATION LIMITATIONS: meal prep, driving, and community activity  PERSONAL FACTORS: Fitness, Transportation, and 1-2 comorbidities:   DM2, HTN are also affecting patient's functional outcome.   REHAB POTENTIAL: Good  CLINICAL DECISION MAKING: Stable/uncomplicated  EVALUATION COMPLEXITY: Moderate  PLAN:  PT FREQUENCY: 2x/week  PT DURATION: 8 weeks (certification for 12 wks to allow for scheduling delays)  PLANNED INTERVENTIONS: Therapeutic exercises, Therapeutic activity, Neuromuscular re-education, Balance training, Gait training, Patient/Family education, Self Care, Joint mobilization, Stair training, Vestibular training, Canalith repositioning, Visual/preceptual remediation/compensation, Orthotic/Fit training, DME instructions, Aquatic Therapy, Dry  Needling, Electrical stimulation, Wheelchair mobility training, Cryotherapy, Moist heat, Taping, Manual therapy, and Re-evaluation  PLAN FOR NEXT SESSION: check BP!, add to HEP for LE strength, balance, endurance (walking program?), practice gait with quad cane, work on fall recovery when safe and able (floor transfer), hip strength, lateral weight shifting ***   Excell Seltzer, PT, DPT, CSRS 04/24/2022, 8:13 AM

## 2022-04-26 ENCOUNTER — Ambulatory Visit: Payer: BC Managed Care – PPO | Attending: Physical Medicine & Rehabilitation | Admitting: Physical Therapy

## 2022-04-26 VITALS — BP 173/74 | HR 49

## 2022-04-26 DIAGNOSIS — R2689 Other abnormalities of gait and mobility: Secondary | ICD-10-CM | POA: Diagnosis present

## 2022-04-26 DIAGNOSIS — I69354 Hemiplegia and hemiparesis following cerebral infarction affecting left non-dominant side: Secondary | ICD-10-CM | POA: Insufficient documentation

## 2022-04-26 DIAGNOSIS — R2681 Unsteadiness on feet: Secondary | ICD-10-CM | POA: Diagnosis present

## 2022-04-26 DIAGNOSIS — M6281 Muscle weakness (generalized): Secondary | ICD-10-CM | POA: Diagnosis present

## 2022-04-26 NOTE — Therapy (Signed)
OUTPATIENT PHYSICAL THERAPY NEURO TREATMENT   Patient Name: Joseph Ortega MRN: [id number] DOB:11-01-1955, 67 y.o., male Today's Date: 04/26/2022   PCP: [facility] REFERRING PROVIDER: Ranelle Oyster, MD  END OF SESSION:  PT End of Session - 04/26/22 1019     Visit Number 6    Number of Visits 17   with eval   Date for PT Re-Evaluation 06/07/22    Authorization Type [employer]    Progress Note Due on Visit 10    PT Start Time 1015    PT Stop Time 1100    PT Time Calculation (min) 45 min    Equipment Utilized During Treatment Gait belt    Activity Tolerance Patient tolerated treatment well   Hypertension   Behavior During Therapy Flat affect                  Past Medical History:  Diagnosis Date   Diabetes mellitus without complication (HCC)    Hypertension    Thyroid disease    Past Surgical History:  Procedure Laterality Date   HERNIA REPAIR     IR ANGIO INTRA EXTRACRAN SEL COM CAROTID INNOMINATE BILAT MOD SED  03/09/2021   IR ANGIO VERTEBRAL SEL VERTEBRAL BILAT MOD SED  03/09/2021   IR US GUIDE VASC ACCESS RIGHT  03/09/2021   Patient Active Problem List   Diagnosis Date Noted   ICH (intracerebral hemorrhage) (HCC) 03/17/2021   Hypothyroidism 03/12/2021   OSA (obstructive sleep apnea) 03/12/2021   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   Uncontrolled type 2 diabetes mellitus with hyperglycemia (HCC) 03/12/2021   Redness of both eyes 03/12/2021   Morbid obesity (HCC) 03/11/2021   Essential hypertension 03/11/2021   Tobacco abuse 03/11/2021   Herpes zoster with complication    SAH (subarachnoid hemorrhage) (HCC) 03/08/2021    ONSET DATE: 01/19/2022  REFERRING DIAG: I61.1 (ICD-10-CM) - 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 No falls or other changes since last visit. Pt got a new bed but having some difficulty getting in/out, plans to get a bedrail.  Pt accompanied by: self  PERTINENT HISTORY: DM2, HTN  PAIN:  Are you having pain? Yes: NPRS scale: 3/10 Pain location: neck and shoulders (from spinal stenosis) Pain description: throbbing Aggravating factors: weather Relieving factors: cortisone shots from [facility]  PRECAUTIONS: Fall  VITALS Vitals:   04/26/22 1027 04/26/22 1028  BP: (!) 187/82 (!) 173/74  Pulse: (!) 53 (!) 49    WEIGHT BEARING RESTRICTIONS: No  FALLS: Has patient fallen in last 6 months? No  LIVING ENVIRONMENT: Lives with: lives with their spouse Lives in: House/apartment Stairs: Yes: Internal: 2 steps; on right going up and External: 6 steps; none Has following equipment at home: Quad cane large base, Environmental consultant - 2 wheeled, Environmental consultant - 4 wheeled, Wheelchair (manual), bed side commode, Grab bars, and Ramped entry  PLOF: Independent with gait, Independent with transfers, and Requires assistive device for independence  PATIENT GOALS: "get rid of that thing (rollator) and progress to walking with a cane", "get stronger in my legs", "be able to stand and balance without holding onto anything to be able to get back to cooking"  OBJECTIVE:   DIAGNOSTIC FINDINGS:  Brain MRI 07/11/21 IMPRESSION:   MRI brain (with and without) demonstrating: -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 -Chronic ischemic infarction within the right occipital region with associated hemosiderin deposition. -Multiple scattered chronic cerebral microhemorrhages. -Moderate chronic small vessel ischemic disease. -No acute findings.  No evidence of underlying vascular malformations or mass lesions.   -Compared to prior MRI from 03/10/2021, expected evolution of changes noted from prior infarctions.   TODAY'S TREATMENT:                 Ther Act  Assessed BP (see above) and initially elevated but lowered w/5 minutes of seated rest, so within limits for PT    [facility] PT Assessment - 04/26/22 1037       Ambulation/Gait   Gait velocity 32.8 ft over 27.12 sec = 1.21 ft/sec      Standardized Balance Assessment   Standardized Balance Assessment Berg Balance Test;Timed Up and Go Test      Berg Balance Test   Sit to Stand Able to stand  independently using hands    Standing Unsupported Able to stand 2 minutes with supervision    Sitting with Back Unsupported but Feet Supported on Floor or Stool Able to sit safely and securely 2 minutes    Stand to Sit Controls descent by using hands    Transfers Able to transfer safely, definite need of hands    Standing Unsupported with Eyes Closed Able to stand 10 seconds with supervision    Standing Unsupported with Feet Together Needs help to attain position but able to stand for 30 seconds with feet together    From Standing, Reach Forward with Outstretched Arm Reaches forward but needs supervision    From Standing Position, Pick up Object from Floor Unable to try/needs assist to keep balance    From Standing Position, Turn to Look Behind Over each Shoulder Turn sideways only but maintains balance    Turn 360 Degrees Needs assistance while turning    Standing Unsupported, Alternately Place Feet on Step/Stool Needs assistance to keep from falling or unable to try    Standing Unsupported, One Foot in Front Needs help to step but can hold 15 seconds     Standing on One Leg Tries to lift leg/unable to hold 3 seconds but  remains standing independently    Total Score 25    Berg comment: 25/56, high fall risk      Timed Up and Go Test   TUG Normal TUG    Normal TUG (seconds) 32   with rollator            Ther Ex  SciFit multi-peaks level 1.5 for 5 minutes using BUE/BLEs for  dynamic cardiovascular conditioning and global strength. Noted abduction of L knee throughout as pt unable to maintain proper knee position 2/2 weakness. Pt able to maintain grip w/LUE throughout. RPE of 2/10.  PATIENT EDUCATION: Education details: Continue HEP, results of OM Person educated: Patient Education method: Explanation and Demonstration Education comprehension: verbalized understanding, returned demonstration, and needs further education  HOME EXERCISE PROGRAM: Access Code: [email or web address] URL: [email or web address] Date: 04/03/2022 Prepared by: Excell Seltzer  Exercises - Sit to Stand Without Arm Support  - 1 x daily - 7 x weekly - 3 sets - 10 reps - Mini Squat with Counter Support  - 1 x daily - 7 x weekly - 3 sets - 10 reps - Standing Hip Extension with Unilateral Counter Support  - 1 x daily - 7 x weekly - 3 sets - 10 reps - Standing Hip Abduction with Unilateral Counter Support  - 1 x daily - 7 x weekly - 3 sets - 10 reps   GOALS: Goals reviewed with patient? Yes  SHORT TERM GOALS: Target date: 04/27/2022   Pt will be independent with initial HEP for improved strength, balance, transfers and gait. Baseline: Goal status: MET  2.  Pt will improve Berg score to 26/56 for decreased fall risk Baseline: 22/56 (1/16), 25/56 (2/8) Goal status: IN PROGRESS  3.  Pt will improve normal TUG to less than or equal to 35 seconds for improved functional mobility and decreased fall risk. Baseline: 43.31 sec (12/28), 32 sec (2/8) Goal status: MET  4.  Pt will improve gait velocity to at least 1.25 ft/sec for improved gait efficiency and  performance at mod I level  Baseline: 1.05 ft/sec (12/28), 1.21 ft/sec (2/8) Goal status: IN PROGRESS   LONG TERM GOALS: Target date: 05/25/2022     Pt will be independent with final HEP for improved strength, balance, transfers and gait. Baseline:  Goal status: INITIAL  2.  Pt will improve Berg score to 30/56 for decreased fall risk Baseline: 22/56 (1/16), 25/56 (2/8) Goal status: INITIAL  3.  Pt will improve normal TUG to less than or equal to 30 seconds for improved functional mobility and decreased fall risk. Baseline: 43.31 sec (12/28), 32 sec (2/8) Goal status: INITIAL  4.  Pt will improve gait velocity to at least 1.5 ft/sec for improved gait efficiency and performance at mod I level  Baseline: 1.05 ft/sec (12/28), 1.21 ft/sec (2/8) Goal status: INITIAL  5.  Pt will improve 5 x STS to less than or equal to 30 seconds to demonstrate improved functional strength and transfer efficiency.  Baseline: 36.59 sec (12/28) Goal status: INITIAL  6.  Pt will improve his score on the 2MWT to 200 ft with LRAD at mod I level Baseline: 164 ft with rollator at Supervision level (1/16) Goal status: INITIAL   ASSESSMENT:  CLINICAL IMPRESSION: Emphasis of skilled PT session on reassessing STG and working on global endurance training. Pt has met 2/4 STG but is making progress towards all goals. He is independent with his HEP, has improved his gait speed from 1.05 ft/sec to 1.21 ft/sec this date,  has improved his TUG score from 43 sec to 32 sec this date, and has improved his Berg score from 22/56 to 25/56 this date, demonstrating an overall decreased fall risk from initial evaluation. Pt does continue to benefit from skilled therapy services as these scores do continue to indicate a high fall risk. Continue POC.    OBJECTIVE IMPAIRMENTS: Abnormal gait, cardiopulmonary status limiting activity, decreased activity tolerance, decreased balance, decreased endurance, decreased knowledge of use of  DME, decreased mobility, difficulty walking, decreased strength, impaired perceived functional ability, impaired sensation, and impaired UE functional use.   ACTIVITY LIMITATIONS: carrying, lifting, bending, standing, squatting, stairs, transfers, and reach over head  PARTICIPATION LIMITATIONS: meal prep, driving, and community activity  PERSONAL FACTORS: Fitness, Transportation, and 1-2 comorbidities:   DM2, HTN are also affecting patient's functional outcome.   REHAB POTENTIAL: Good  CLINICAL DECISION MAKING: Stable/uncomplicated  EVALUATION COMPLEXITY: Moderate  PLAN:  PT FREQUENCY: 2x/week  PT DURATION: 8 weeks (certification for 12 wks to allow for scheduling delays)  PLANNED INTERVENTIONS: Therapeutic exercises, Therapeutic activity, Neuromuscular re-education, Balance training, Gait training, Patient/Family education, Self Care, Joint mobilization, Stair training, Vestibular training, Canalith repositioning, Visual/preceptual remediation/compensation, Orthotic/Fit training, DME instructions, Aquatic Therapy, Dry Needling, Electrical stimulation, Wheelchair mobility training, Cryotherapy, Moist heat, Taping, Manual therapy, and Re-evaluation  PLAN FOR NEXT SESSION: check BP!, add to HEP for LE strength, balance, endurance (walking program?), practice gait with quad cane, work on fall recovery when safe and able (floor transfer), hip strength, lateral weight shifting    Excell Seltzer, PT, DPT, CSRS 04/26/2022, 11:02 AM

## 2022-05-01 ENCOUNTER — Ambulatory Visit: Payer: BC Managed Care – PPO | Admitting: Occupational Therapy

## 2022-05-01 ENCOUNTER — Ambulatory Visit: Payer: BC Managed Care – PPO | Admitting: Physical Therapy

## 2022-05-01 VITALS — BP 200/91 | HR 56

## 2022-05-01 DIAGNOSIS — R2689 Other abnormalities of gait and mobility: Secondary | ICD-10-CM

## 2022-05-01 DIAGNOSIS — I69354 Hemiplegia and hemiparesis following cerebral infarction affecting left non-dominant side: Secondary | ICD-10-CM

## 2022-05-01 DIAGNOSIS — R2681 Unsteadiness on feet: Secondary | ICD-10-CM

## 2022-05-01 DIAGNOSIS — M6281 Muscle weakness (generalized): Secondary | ICD-10-CM

## 2022-05-01 NOTE — Therapy (Signed)
OUTPATIENT PHYSICAL THERAPY NEURO TREATMENT-ARRIVED NO CHARGE   Patient Name: Joseph Ortega MRN: [id number] DOB:03/04/1956, 67 y.o., male Today's Date: 05/01/2022   PCP: [facility] REFERRING PROVIDER: Meredith Staggers, MD  END OF SESSION:  PT End of Session - 05/01/22 1155     Visit Number 6    Number of Visits 64   with eval   Date for PT Re-Evaluation 06/07/22    Authorization Type [employer]    Progress Note Due on Visit 10    PT Start Time 1150   this therapist ran over with previous patient   PT Stop Time 1222   arrived no charge due to hypertension   PT Time Calculation (min) 32 min    Equipment Utilized During Treatment Gait belt    Activity Tolerance Treatment limited secondary to medical complications (Comment)   Hypertension   Behavior During Therapy Flat affect                   Past Medical History:  Diagnosis Date   Diabetes mellitus without complication ([place])    Hypertension    Thyroid disease    Past Surgical History:  Procedure Laterality Date   HERNIA REPAIR     IR ANGIO INTRA EXTRACRAN SEL COM CAROTID INNOMINATE BILAT MOD SED  03/09/2021   IR ANGIO VERTEBRAL SEL VERTEBRAL BILAT MOD SED  03/09/2021   IR US GUIDE VASC ACCESS RIGHT  03/09/2021   Patient Active Problem List   Diagnosis Date Noted   ICH (intracerebral hemorrhage) ([place]) 03/17/2021   Hypothyroidism 03/12/2021   OSA (obstructive sleep apnea) 03/12/2021   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   Uncontrolled type 2 diabetes mellitus with hyperglycemia ([place]) 03/12/2021   Redness of both eyes 03/12/2021   Morbid obesity ([place]) 03/11/2021   Essential hypertension 03/11/2021   Tobacco abuse 03/11/2021   Herpes zoster with complication    SAH (subarachnoid hemorrhage) ([place]) 03/08/2021    ONSET DATE: 01/19/2022  REFERRING DIAG: I61.1 (ICD-10-CM) - 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 Pt  reports no acute changes since last session, no falls.   Pt accompanied by: self  PERTINENT HISTORY: DM2, HTN  PAIN:  Are you having pain? Yes: NPRS scale: 3/10 Pain location: neck and shoulders (from spinal stenosis) Pain description: throbbing Aggravating factors: weather Relieving factors: cortisone shots from [facility]  PRECAUTIONS: Fall  VITALS Vitals:   05/01/22 1202 05/01/22 1221  BP: (!) 207/96 (!) 200/91  Pulse: (!) 56      WEIGHT BEARING RESTRICTIONS: No  FALLS: Has patient fallen in last 6 months? No  LIVING ENVIRONMENT: Lives with: lives with their spouse Lives in: House/apartment Stairs: Yes: Internal: 2 steps; on right going up and External: 6 steps; none Has following equipment at home: Quad cane large base, Environmental consultant - 2 wheeled, Environmental consultant - 4 wheeled, Wheelchair (manual), bed side commode, Grab bars, and Ramped entry  PLOF: Independent with gait, Independent with transfers, and Requires assistive device for independence  PATIENT GOALS: "get rid of that thing (rollator) and progress to walking with a cane", "get stronger in my legs", "be able to stand and balance without holding onto anything to be able to get back to cooking"  OBJECTIVE:   DIAGNOSTIC FINDINGS:  Brain MRI 07/11/21 IMPRESSION:   MRI brain (with and without) demonstrating: -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 -Chronic ischemic infarction within the right occipital region with associated hemosiderin deposition. -Multiple scattered chronic cerebral microhemorrhages. -Moderate chronic small vessel ischemic disease. -No acute findings.  No evidence of underlying vascular malformations or mass lesions.   -Compared to prior MRI from 03/10/2021, expected evolution of changes noted from prior infarctions.   TODAY'S TREATMENT:                 Ther Act  Assessed BP (see above) and pt with hypertension this date. Encouraged pt to go to the ED due to significant hypertension this date. Pt to ask his transportation to take him to ED, otherwise plans to call 911. Pt with no other signs/symptoms of hypertension this date other than BP readings. Pt does report taking his BP medication this morning.   PATIENT EDUCATION: Education details: seek emergency medical care for hypertension Person educated: Patient Education method: Explanation and Demonstration Education comprehension: verbalized understanding, returned demonstration, and needs further education  HOME EXERCISE PROGRAM: Access Code: [email or web address] URL: [email or web address] Date: 04/03/2022 Prepared by: Excell Seltzer  Exercises - Sit to Stand Without Arm Support  - 1 x daily - 7 x weekly - 3 sets - 10 reps - Mini Squat with Counter Support  - 1 x daily - 7 x weekly - 3 sets - 10 reps - Standing Hip Extension with Unilateral Counter Support  - 1 x daily - 7 x weekly - 3 sets - 10 reps - Standing Hip Abduction with Unilateral Counter Support  - 1 x daily - 7 x weekly - 3 sets - 10 reps   GOALS: Goals reviewed with patient? Yes  SHORT TERM GOALS: Target date: 04/27/2022   Pt will be independent with initial HEP for improved strength, balance, transfers and gait. Baseline: Goal status: MET  2.  Pt will improve Berg score to 26/56 for decreased fall risk Baseline: 22/56  (1/16), 25/56 (2/8) Goal status: IN PROGRESS  3.  Pt will improve normal TUG to less than or equal to 35 seconds for improved functional mobility and decreased fall risk. Baseline: 43.31 sec (12/28), 32 sec (2/8) Goal status:  MET  4.  Pt will improve gait velocity to at least 1.25 ft/sec for improved gait efficiency and performance at mod I level  Baseline: 1.05 ft/sec (12/28), 1.21 ft/sec (2/8) Goal status: IN PROGRESS   LONG TERM GOALS: Target date: 05/25/2022     Pt will be independent with final HEP for improved strength, balance, transfers and gait. Baseline:  Goal status: INITIAL  2.  Pt will improve Berg score to 30/56 for decreased fall risk Baseline: 22/56 (1/16), 25/56 (2/8) Goal status: INITIAL  3.  Pt will improve normal TUG to less than or equal to 30 seconds for improved functional mobility and decreased fall risk. Baseline: 43.31 sec (12/28), 32 sec (2/8) Goal status: INITIAL  4.  Pt will improve gait velocity to at least 1.5 ft/sec for improved gait efficiency and performance at mod I level  Baseline: 1.05 ft/sec (12/28), 1.21 ft/sec (2/8) Goal status: INITIAL  5.  Pt will improve 5 x STS to less than or equal to 30 seconds to demonstrate improved functional strength and transfer efficiency.  Baseline: 36.59 sec (12/28) Goal status: INITIAL  6.  Pt will improve his score on the 2MWT to 200 ft with LRAD at mod I level Baseline: 164 ft with rollator at Supervision level (1/16) Goal status: INITIAL   ASSESSMENT:  CLINICAL IMPRESSION: Arrived no charge due to hypertension.    OBJECTIVE IMPAIRMENTS: Abnormal gait, cardiopulmonary status limiting activity, decreased activity tolerance, decreased balance, decreased endurance, decreased knowledge of use of DME, decreased mobility, difficulty walking, decreased strength, impaired perceived functional ability, impaired sensation, and impaired UE functional use.   ACTIVITY LIMITATIONS: carrying, lifting, bending,  standing, squatting, stairs, transfers, and reach over head  PARTICIPATION LIMITATIONS: meal prep, driving, and community activity  PERSONAL FACTORS: Fitness, Transportation, and 1-2 comorbidities:   DM2, HTN are also affecting patient's functional outcome.   REHAB POTENTIAL: Good  CLINICAL DECISION MAKING: Stable/uncomplicated  EVALUATION COMPLEXITY: Moderate  PLAN:  PT FREQUENCY: 2x/week  PT DURATION: 8 weeks (certification for 12 wks to allow for scheduling delays)  PLANNED INTERVENTIONS: Therapeutic exercises, Therapeutic activity, Neuromuscular re-education, Balance training, Gait training, Patient/Family education, Self Care, Joint mobilization, Stair training, Vestibular training, Canalith repositioning, Visual/preceptual remediation/compensation, Orthotic/Fit training, DME instructions, Aquatic Therapy, Dry Needling, Electrical stimulation, Wheelchair mobility training, Cryotherapy, Moist heat, Taping, Manual therapy, and Re-evaluation  PLAN FOR NEXT SESSION: check BP!, add to HEP for LE strength, balance, endurance (walking program?), practice gait with quad cane, work on fall recovery when safe and able (floor transfer), hip strength, lateral weight shifting    Excell Seltzer, PT, DPT, CSRS 05/01/2022, 12:22 PM

## 2022-05-01 NOTE — Therapy (Signed)
Pt arrived to clinic for PT visit at 11:45. Pt encouraged by PT to go to the ED given BP (207/96).   Will continue therapy efforts once medically appropriate.

## 2022-05-02 ENCOUNTER — Emergency Department (HOSPITAL_COMMUNITY): Payer: BC Managed Care – PPO

## 2022-05-02 ENCOUNTER — Inpatient Hospital Stay (HOSPITAL_COMMUNITY): Payer: BC Managed Care – PPO

## 2022-05-02 ENCOUNTER — Inpatient Hospital Stay (HOSPITAL_COMMUNITY)
Admission: EM | Admit: 2022-05-02 | Discharge: 2022-05-07 | DRG: 040 | Disposition: A | Discharge status: Rehabilitation Facility | Payer: BC Managed Care – PPO | Attending: Neurology | Admitting: Neurology

## 2022-05-02 ENCOUNTER — Encounter (HOSPITAL_COMMUNITY): Payer: Self-pay | Admitting: Neurology

## 2022-05-02 DIAGNOSIS — Z79890 Hormone replacement therapy (postmenopausal): Secondary | ICD-10-CM | POA: Diagnosis not present

## 2022-05-02 DIAGNOSIS — E669 Obesity, unspecified: Secondary | ICD-10-CM | POA: Diagnosis present

## 2022-05-02 DIAGNOSIS — I63511 Cerebral infarction due to unspecified occlusion or stenosis of right middle cerebral artery: Principal | ICD-10-CM | POA: Diagnosis present

## 2022-05-02 DIAGNOSIS — I6389 Other cerebral infarction: Secondary | ICD-10-CM | POA: Diagnosis not present

## 2022-05-02 DIAGNOSIS — E039 Hypothyroidism, unspecified: Secondary | ICD-10-CM | POA: Diagnosis present

## 2022-05-02 DIAGNOSIS — I161 Hypertensive emergency: Secondary | ICD-10-CM | POA: Diagnosis present

## 2022-05-02 DIAGNOSIS — Z7984 Long term (current) use of oral hypoglycemic drugs: Secondary | ICD-10-CM | POA: Diagnosis not present

## 2022-05-02 DIAGNOSIS — E785 Hyperlipidemia, unspecified: Secondary | ICD-10-CM | POA: Diagnosis present

## 2022-05-02 DIAGNOSIS — E1165 Type 2 diabetes mellitus with hyperglycemia: Secondary | ICD-10-CM | POA: Diagnosis present

## 2022-05-02 DIAGNOSIS — Z7985 Long-term (current) use of injectable non-insulin antidiabetic drugs: Secondary | ICD-10-CM | POA: Diagnosis not present

## 2022-05-02 DIAGNOSIS — Z7982 Long term (current) use of aspirin: Secondary | ICD-10-CM | POA: Diagnosis not present

## 2022-05-02 DIAGNOSIS — Z8673 Personal history of transient ischemic attack (TIA), and cerebral infarction without residual deficits: Secondary | ICD-10-CM

## 2022-05-02 DIAGNOSIS — R471 Dysarthria and anarthria: Secondary | ICD-10-CM | POA: Diagnosis present

## 2022-05-02 DIAGNOSIS — Z6837 Body mass index (BMI) 37.0-37.9, adult: Secondary | ICD-10-CM

## 2022-05-02 DIAGNOSIS — Z833 Family history of diabetes mellitus: Secondary | ICD-10-CM | POA: Diagnosis not present

## 2022-05-02 DIAGNOSIS — G8194 Hemiplegia, unspecified affecting left nondominant side: Secondary | ICD-10-CM | POA: Diagnosis present

## 2022-05-02 DIAGNOSIS — Z794 Long term (current) use of insulin: Secondary | ICD-10-CM

## 2022-05-02 DIAGNOSIS — I639 Cerebral infarction, unspecified: Secondary | ICD-10-CM | POA: Diagnosis present

## 2022-05-02 DIAGNOSIS — I10 Essential (primary) hypertension: Secondary | ICD-10-CM | POA: Diagnosis present

## 2022-05-02 DIAGNOSIS — A0811 Acute gastroenteropathy due to Norwalk agent: Secondary | ICD-10-CM | POA: Diagnosis not present

## 2022-05-02 DIAGNOSIS — G936 Cerebral edema: Secondary | ICD-10-CM | POA: Diagnosis present

## 2022-05-02 DIAGNOSIS — I63411 Cerebral infarction due to embolism of right middle cerebral artery: Secondary | ICD-10-CM | POA: Diagnosis not present

## 2022-05-02 DIAGNOSIS — G4733 Obstructive sleep apnea (adult) (pediatric): Secondary | ICD-10-CM | POA: Diagnosis present

## 2022-05-02 DIAGNOSIS — I63031 Cerebral infarction due to thrombosis of right carotid artery: Secondary | ICD-10-CM | POA: Diagnosis not present

## 2022-05-02 DIAGNOSIS — E876 Hypokalemia: Secondary | ICD-10-CM | POA: Diagnosis not present

## 2022-05-02 DIAGNOSIS — I619 Nontraumatic intracerebral hemorrhage, unspecified: Secondary | ICD-10-CM | POA: Diagnosis present

## 2022-05-02 DIAGNOSIS — Z79899 Other long term (current) drug therapy: Secondary | ICD-10-CM

## 2022-05-02 DIAGNOSIS — R29706 NIHSS score 6: Secondary | ICD-10-CM | POA: Diagnosis present

## 2022-05-02 DIAGNOSIS — E119 Type 2 diabetes mellitus without complications: Secondary | ICD-10-CM | POA: Diagnosis present

## 2022-05-02 DIAGNOSIS — E11649 Type 2 diabetes mellitus with hypoglycemia without coma: Secondary | ICD-10-CM | POA: Diagnosis not present

## 2022-05-02 LAB — CBC WITH DIFFERENTIAL/PLATELET
Abs Immature Granulocytes: 0.02 10*3/uL (ref 0.00–0.07)
Basophils Absolute: 0 10*3/uL (ref 0.0–0.1)
Basophils Relative: 1 %
Eosinophils Absolute: 0.1 10*3/uL (ref 0.0–0.5)
Eosinophils Relative: 2 %
HCT: 41.3 % (ref 39.0–52.0)
Hemoglobin: 14 g/dL (ref 13.0–17.0)
Immature Granulocytes: 0 %
Lymphocytes Relative: 15 %
Lymphs Abs: 0.9 10*3/uL (ref 0.7–4.0)
MCH: 32 pg (ref 26.0–34.0)
MCHC: 33.9 g/dL (ref 30.0–36.0)
MCV: 94.5 fL (ref 80.0–100.0)
Monocytes Absolute: 0.4 10*3/uL (ref 0.1–1.0)
Monocytes Relative: 7 %
Neutro Abs: 4.4 10*3/uL (ref 1.7–7.7)
Neutrophils Relative %: 75 %
Platelets: 214 10*3/uL (ref 150–400)
RBC: 4.37 MIL/uL (ref 4.22–5.81)
RDW: 14.9 % (ref 11.5–15.5)
WBC: 5.9 10*3/uL (ref 4.0–10.5)
nRBC: 0 % (ref 0.0–0.2)

## 2022-05-02 LAB — URINALYSIS, ROUTINE W REFLEX MICROSCOPIC
Bacteria, UA: NONE SEEN
Bilirubin Urine: NEGATIVE
Glucose, UA: >=500 mg/dL — AB
Hgb urine dipstick: NEGATIVE
Ketones, ur: 5 mg/dL — AB
Leukocytes,Ua: NEGATIVE
Nitrite: NEGATIVE
Protein, ur: 30 mg/dL — AB
Specific Gravity, Urine: 1.012 (ref 1.005–1.030)
pH: 7 (ref 5.0–8.0)

## 2022-05-02 LAB — COMPREHENSIVE METABOLIC PANEL
ALT: 15 U/L (ref 0–44)
AST: 27 U/L (ref 15–41)
Albumin: 3.5 g/dL (ref 3.5–5.0)
Alkaline Phosphatase: 98 U/L (ref 38–126)
Anion gap: 11 (ref 5–15)
BUN: 11 mg/dL (ref 8–23)
CO2: 26 mmol/L (ref 22–32)
Calcium: 9.1 mg/dL (ref 8.9–10.3)
Chloride: 100 mmol/L (ref 98–111)
Creatinine, Ser: 0.8 mg/dL (ref 0.61–1.24)
GFR, Estimated: >60 mL/min (ref >60)
Glucose, Bld: 159 mg/dL — ABNORMAL HIGH (ref 70–99)
Potassium: 3.8 mmol/L (ref 3.5–5.1)
Sodium: 137 mmol/L (ref 135–145)
Total Bilirubin: 1.3 mg/dL — ABNORMAL HIGH (ref 0.3–1.2)
Total Protein: 7.1 g/dL (ref 6.5–8.1)

## 2022-05-02 LAB — TROPONIN I (HIGH SENSITIVITY)
Troponin I (High Sensitivity): 20 ng/L — ABNORMAL HIGH (ref <18)
Troponin I (High Sensitivity): 22 ng/L — ABNORMAL HIGH (ref <18)

## 2022-05-02 LAB — GLUCOSE, CAPILLARY: Glucose-Capillary: 151 mg/dL — ABNORMAL HIGH (ref 70–99)

## 2022-05-02 LAB — HIV ANTIBODY (ROUTINE TESTING W REFLEX): HIV Screen 4th Generation wRfx: NONREACTIVE

## 2022-05-02 MED ORDER — LORAZEPAM 2 MG/ML IJ SOLN
INTRAMUSCULAR | Status: AC
  Filled 2022-05-02: qty 1

## 2022-05-02 MED ORDER — SENNOSIDES-DOCUSATE SODIUM 8.6-50 MG PO TABS
1.0000 | ORAL_TABLET | Freq: Two times a day (BID) | ORAL | Status: DC
  Administered 2022-05-03 – 2022-05-06 (×5): 1 via ORAL
  Filled 2022-05-02 (×7): qty 1

## 2022-05-02 MED ORDER — ACETAMINOPHEN 650 MG RE SUPP: 650.0000 mg | RECTAL | Status: DC | PRN

## 2022-05-02 MED ORDER — INSULIN ASPART 100 UNIT/ML IJ SOLN
0.0000 [IU] | Freq: Three times a day (TID) | INTRAMUSCULAR | Status: DC
  Administered 2022-05-03: 3 [IU] via SUBCUTANEOUS
  Administered 2022-05-04: 5 [IU] via SUBCUTANEOUS
  Administered 2022-05-04: 3 [IU] via SUBCUTANEOUS
  Administered 2022-05-04: 5 [IU] via SUBCUTANEOUS
  Administered 2022-05-05 – 2022-05-06 (×4): 3 [IU] via SUBCUTANEOUS
  Administered 2022-05-06: 8 [IU] via SUBCUTANEOUS
  Administered 2022-05-06: 3 [IU] via SUBCUTANEOUS
  Administered 2022-05-07 (×3): 5 [IU] via SUBCUTANEOUS

## 2022-05-02 MED ORDER — LEVOTHYROXINE SODIUM 25 MCG PO TABS
125.0000 ug | ORAL_TABLET | Freq: Every day | ORAL | Status: DC
  Administered 2022-05-03 – 2022-05-07 (×5): 125 ug via ORAL
  Filled 2022-05-02 (×5): qty 1

## 2022-05-02 MED ORDER — CARVEDILOL 12.5 MG PO TABS
12.5000 mg | ORAL_TABLET | Freq: Two times a day (BID) | ORAL | Status: DC
  Administered 2022-05-03 – 2022-05-07 (×10): 12.5 mg via ORAL
  Filled 2022-05-02 (×10): qty 1

## 2022-05-02 MED ORDER — DULOXETINE HCL 20 MG PO CPEP
20.0000 mg | ORAL_CAPSULE | Freq: Two times a day (BID) | ORAL | Status: DC
  Administered 2022-05-02 – 2022-05-07 (×10): 20 mg via ORAL
  Filled 2022-05-02 (×12): qty 1

## 2022-05-02 MED ORDER — CLEVIDIPINE BUTYRATE 0.5 MG/ML IV EMUL
0.0000 mg/h | INTRAVENOUS | Status: DC
  Administered 2022-05-02 – 2022-05-03 (×2): 2 mg/h via INTRAVENOUS
  Filled 2022-05-02: qty 100
  Filled 2022-05-02: qty 50

## 2022-05-02 MED ORDER — LORAZEPAM 2 MG/ML IJ SOLN
1.0000 mg | INTRAMUSCULAR | Status: AC | PRN
  Administered 2022-05-02 (×2): 1 mg via INTRAVENOUS

## 2022-05-02 MED ORDER — STROKE: EARLY STAGES OF RECOVERY BOOK
Freq: Once | Status: AC
  Filled 2022-05-02: qty 1

## 2022-05-02 MED ORDER — ACETAMINOPHEN 325 MG PO TABS
650.0000 mg | ORAL_TABLET | ORAL | Status: DC | PRN
  Administered 2022-05-02 – 2022-05-06 (×7): 650 mg via ORAL
  Filled 2022-05-02 (×7): qty 2

## 2022-05-02 MED ORDER — PANTOPRAZOLE SODIUM 40 MG IV SOLR
40.0000 mg | Freq: Every day | INTRAVENOUS | Status: DC
  Administered 2022-05-02: 40 mg via INTRAVENOUS
  Filled 2022-05-02: qty 10

## 2022-05-02 MED ORDER — ACETAMINOPHEN 160 MG/5ML PO SOLN: 650.0000 mg | ORAL | Status: DC | PRN

## 2022-05-02 MED ORDER — ROSUVASTATIN CALCIUM 20 MG PO TABS
40.0000 mg | ORAL_TABLET | Freq: Every day | ORAL | Status: DC
  Administered 2022-05-03 – 2022-05-07 (×5): 40 mg via ORAL
  Filled 2022-05-02 (×5): qty 2

## 2022-05-02 NOTE — ED Triage Notes (Signed)
Patient BIB EMS from home with c/o headache/ Chest pain 6/10, ASA 343m. Took all blood pressure medications. 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

## 2022-05-02 NOTE — ED Notes (Signed)
Neuro doctor talking with patient

## 2022-05-02 NOTE — H&P (Signed)
Neurology H&P  CC: Chest pain/headache  History is obtained from: Patient  HPI: Joseph Ortega is a 67 y.o. male with a history of previous stroke with left-sided weakness who presents with headache that started yesterday as well as some chest pain that started overnight.  Due to his headache he had a head CT obtained which shows a fairly large hemorrhagic infarct.  He denies any change in his left side, states that he has been continuing to work with rehab since his stroke in December 2022.  He feels that the left side has been improving.  He does have some chest pain, which is reproducible on palpation.   LKW: Unclear tpa given?: No, ICH IR Thrombectomy? No, ICH Modified Rankin Scale: 3-Moderate disability-requires help but walks WITHOUT assistance NIHSS score: 5 1A: Level of Consciousness - 0 1B: Ask Month and Age - 0 1C: 'Blink Eyes' & 'Squeeze Hands' - 0 2: Test Horizontal Extraocular Movements - 0 3: Test Visual Fields - 1 4: Test Facial Palsy - 0 5A: Test Left Arm Motor Drift - 1 5B: Test Right Arm Motor Drift - 0 6A: Test Left Leg Motor Drift - 1 6B: Test Right Leg Motor Drift - 0 7: Test Limb Ataxia - 0 8: Test Sensation - 1 9: Test Language/Aphasia- 0 10: Test Dysarthria - 0 11: Test Extinction/Inattention - 1   ICH score: 1   Past Medical History:  Diagnosis Date   Diabetes mellitus without complication ([place])    Hypertension    Thyroid disease      Family History  Problem Relation Age of Onset   Diabetes Father      Social History:  reports that he has never smoked. He has never used smokeless tobacco. He reports that he does not drink alcohol and does not use drugs.  Prior to Admission medications   Medication Sig Start Date End Date Taking? Authorizing Provider  acetaminophen (TYLENOL) 325 MG tablet Take 2 tablets (650 mg total) by mouth every 6 (six) hours as needed for mild pain or headache. 04/18/21   Setzer, Edman Circle, PA-C  aspirin EC 81 MG EC  tablet Take 1 tablet (81 mg total) by mouth daily. Swallow whole. 04/18/21   Setzer, Edman Circle, PA-C  atorvastatin (LIPITOR) 40 MG tablet TAKE 1 TABLET BY MOUTH EVERY DAY 05/12/21   Bayard Hugger, NP  carvedilol (COREG) 12.5 MG tablet Take 1 tablet (12.5 mg total) by mouth 2 (two) times daily with a meal. 05/12/21   Bayard Hugger, NP  dapagliflozin propanediol (FARXIGA) 10 MG TABS tablet Take 1 tablet by mouth daily. 05/15/21   [provider]  diclofenac Sodium (VOLTAREN) 1 % GEL Apply topically.    [provider]  docusate sodium (COLACE) 100 MG capsule Take 1 capsule (100 mg total) by mouth 2 (two) times daily as needed for mild constipation. 04/18/21   Setzer, Edman Circle, PA-C  doxazosin (CARDURA) 8 MG tablet Take 1 tablet by mouth daily. 06/08/20   [provider]  DULoxetine (CYMBALTA) 20 MG capsule Take 20 mg by mouth 2 (two) times daily. 10/06/19   [provider]  fluticasone (FLONASE) 50 MCG/ACT nasal spray Place 1 spray into both nostrils daily. 04/26/20   [provider]  furosemide (LASIX) 40 MG tablet Take 40 mg by mouth daily. 02/26/22   [provider]  guanFACINE (INTUNIV) 2 MG TB24 ER tablet Take 2 mg by mouth daily. 02/07/22   [provider]  hydrochlorothiazide (HYDRODIURIL)  25 MG tablet TAKE 1 TABLET (25 MG TOTAL) BY MOUTH DAILY. 05/12/21   Bayard Hugger, NP  insulin detemir (LEVEMIR) 100 UNIT/ML FlexPen Inject 39 Units into the skin 2 (two) times daily. 04/18/21   Setzer, Edman Circle, PA-C  Insulin Pen Needle (AURORA PEN NEEDLES) 31G X 6 MM MISC 1 application by Does not apply route 5 (five) times daily. 04/18/21   Setzer, Edman Circle, PA-C  levothyroxine (SYNTHROID, LEVOTHROID) 125 MCG tablet Take 125 mcg by mouth daily before breakfast.    [provider]  metoCLOPramide (REGLAN) 5 MG tablet Take 1 tablet (5 mg total) by mouth 3 (three) times daily before meals. 04/18/21   Setzer, Edman Circle, PA-C  metoprolol tartrate  (LOPRESSOR) 50 MG tablet Take 50 mg by mouth 2 (two) times daily. 12/28/21   [provider]  NOVOLOG FLEXPEN 100 UNIT/ML FlexPen Inject 14 Units into the skin 3 (three) times daily with meals. 04/18/21   Setzer, Edman Circle, PA-C  potassium chloride SA (KLOR-CON M) 20 MEQ tablet Take 1 tablet (20 mEq total) by mouth 2 (two) times daily. 04/18/21   Setzer, Edman Circle, PA-C  rosuvastatin (CRESTOR) 40 MG tablet Take 40 mg by mouth daily. 02/09/22   [provider]  traZODone (DESYREL) 50 MG tablet TAKE 1 TABLET BY MOUTH EVERYDAY AT BEDTIME 08/16/21   Meredith Staggers, MD  TRULICITY 3 0000000 SOPN Inject 3 mg into the skin every Thursday. 07/22/19   [provider]  valsartan (DIOVAN) 320 MG tablet Take 1 tablet (320 mg total) by mouth daily. 04/18/21   Setzer, Edman Circle, PA-C  vitamin B-12 (CYANOCOBALAMIN) 500 MCG tablet Take 500 mcg by mouth daily.    [provider]  Vitamin D, Ergocalciferol, (DRISDOL) 50000 UNITS CAPS capsule Take 50,000 Units by mouth every Sunday. sunday     [provider]     Exam: Current vital signs: BP 138/67   Pulse (!) 58   Temp 97.6 F (36.4 C) (Oral)   Resp (!) 27   SpO2 90%    Physical Exam  Constitutional: Appears well-developed and well-nourished.  Psych: Affect appropriate to situation Eyes: No scleral injection HENT: No OP obstrucion Head: Normocephalic.  Cardiovascular: Normal rate and regular rhythm.  Respiratory: Effort normal and breath sounds normal to anterior ascultation GI: Soft.  No distension. There is no tenderness.  Skin: WDI  Neuro: Mental Status: Patient is awake, alert, oriented to person, place, month, year, and situation. Patient is able to give a clear and coherent history. No signs of aphasia  He does not extinguish to double simultaneous stimulation but does to visual stimuli Cranial Nerves: II: He has a left lower quadrantanopia. Pupils are equal, round, and reactive to light.   III,IV,  VI: EOMI without ptosis or diplopia.  V: Facial sensation is diminished on the left VII: Facial movement is symmetric.  VIII: hearing is intact to voice X: Uvula is midline and palate elevates symmetrically XI: Shoulder shrug is symmetric. XII: tongue is midline without atrophy or fasciculations.  Motor: He is able to lift his arm against gravity if prompted, though he states he cannot.  He is able to hold it with only mild drift, 4/5 strength in the left arm and leg.  He does have impairment of fine motor movements out of proportion. Sensory: Sensation is reduced on the left side, he does not extinguish to double simultaneous stimulation Cerebellar: Finger-nose-finger is intact on the right, consistent with weakness on the left  I have reviewed labs in epic and the pertinent results are: Tr-I 20 CBC unremarkable  I have reviewed the images obtained: CT head-large right temporoparietal infarct with significant hemorrhagic transformation.  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  Though there is question about whether this is truly hematoma, it is so confluent that I would favor treating it as true hemorrhagic conversion, and would favor aggressively controlling his blood pressure overnight.  He has already been started on clevidipine and I will continue this for now.  Plan: Hemorrhagic infarct - HgbA1c, fasting lipid panel - MRI of the brain without contrast - Frequent neuro checks - Echocardiogram - MRA head and neck - Prophylactic therapy-Antiplatelet med: None for now -BP goal one 30-1 50 - Telemetry  monitoring - PT consult, OT consult, Speech consult - Stroke team to follow  DM -SSI  Hypothyroidism -Continue home Synthroid  -Continue home Cymbalta   This patient is critically ill and at significant risk of neurological worsening, death and care requires constant monitoring of vital signs, hemodynamics,respiratory and cardiac monitoring, neurological assessment, discussion with family, other specialists and medical decision making of high complexity. I spent 45 minutes of neurocritical care time  in the care of  this patient. This was time spent independent of any time provided by nurse practitioner or PA.  Roland Rack, MD [facility] [phone]  If 7pm- 7am, please page neurology on call as listed in [facility]. 05/02/2022  7:20 PM

## 2022-05-02 NOTE — Progress Notes (Signed)
Pt has blue shorts, blue shirt and black fanny pack with phone and glasses. No other belongings.

## 2022-05-02 NOTE — ED Provider Notes (Signed)
[facility] Provider Note   CSN: [id number] Arrival date & time: 05/02/22  1329     History Chief Complaint  Patient presents with   Chest Pain   Headache    Joseph Ortega is a 67 y.o. male.  Patient with past medical history significant for morbid obesity, tobacco use, uncontrolled type 2 diabetes, intracerebral hemorrhage presents to the emergency department with complaints of chest pain and headache.  Patient reports that he woke up this morning with significant chest pain that he rated as a 6 out of 10 on pain.  Patient reports that he was given aspirin by EMS and route to the hospital but refused morphine as he was concerned that the morphine would make his headache worse.  Patient denies any nausea, vomiting, diarrhea, fever, shortness of breath.  Patient not currently on any blood thinners.  He reports that he took 2 blood pressure medications this morning which include valsartan and metoprolol but has been out of his hydrochlorothiazide now for a few days.  Patient denies any blurry vision, slurred speech. Does report feeling more confused recently with increased word finding difficulty.   Chest Pain Associated symptoms: headache   Associated symptoms: no cough, no fever, no palpitations, no shortness of breath and no weakness   Headache Associated symptoms: no cough, no fever and no weakness        Home Medications Prior to Admission medications   Medication Sig Start Date End Date Taking? Authorizing Provider  acetaminophen (TYLENOL) 325 MG tablet Take 2 tablets (650 mg total) by mouth every 6 (six) hours as needed for mild pain or headache. 04/18/21   Setzer, Edman Circle, PA-C  aspirin EC 81 MG EC tablet Take 1 tablet (81 mg total) by mouth daily. Swallow whole. 04/18/21   Setzer, Edman Circle, PA-C  atorvastatin (LIPITOR) 40 MG tablet TAKE 1 TABLET BY MOUTH EVERY DAY 05/12/21   Bayard Hugger, NP  carvedilol (COREG) 12.5 MG tablet Take  1 tablet (12.5 mg total) by mouth 2 (two) times daily with a meal. 05/12/21   Bayard Hugger, NP  dapagliflozin propanediol (FARXIGA) 10 MG TABS tablet Take 1 tablet by mouth daily. 05/15/21   [provider]  diclofenac Sodium (VOLTAREN) 1 % GEL Apply topically.    [provider]  docusate sodium (COLACE) 100 MG capsule Take 1 capsule (100 mg total) by mouth 2 (two) times daily as needed for mild constipation. 04/18/21   Setzer, Edman Circle, PA-C  doxazosin (CARDURA) 8 MG tablet Take 1 tablet by mouth daily. 06/08/20   [provider]  DULoxetine (CYMBALTA) 20 MG capsule Take 20 mg by mouth 2 (two) times daily. 10/06/19   [provider]  fluticasone (FLONASE) 50 MCG/ACT nasal spray Place 1 spray into both nostrils daily. 04/26/20   [provider]  furosemide (LASIX) 40 MG tablet Take 40 mg by mouth daily. 02/26/22   [provider]  guanFACINE (INTUNIV) 2 MG TB24 ER tablet Take 2 mg by mouth daily. 02/07/22   [provider]  hydrochlorothiazide (HYDRODIURIL) 25 MG tablet TAKE 1 TABLET (25 MG TOTAL) BY MOUTH DAILY. 05/12/21   Bayard Hugger, NP  insulin detemir (LEVEMIR) 100 UNIT/ML FlexPen Inject 39 Units into the skin 2 (two) times daily. 04/18/21   Setzer, Edman Circle, PA-C  Insulin Pen Needle (AURORA PEN NEEDLES) 31G X 6 MM MISC 1 application by Does not apply route 5 (five) times daily. 04/18/21  Setzer, Edman Circle, PA-C  levothyroxine (SYNTHROID, LEVOTHROID) 125 MCG tablet Take 125 mcg by mouth daily before breakfast.    [provider]  metoCLOPramide (REGLAN) 5 MG tablet Take 1 tablet (5 mg total) by mouth 3 (three) times daily before meals. 04/18/21   Setzer, Edman Circle, PA-C  metoprolol tartrate (LOPRESSOR) 50 MG tablet Take 50 mg by mouth 2 (two) times daily. 12/28/21   [provider]  NOVOLOG FLEXPEN 100 UNIT/ML FlexPen Inject 14 Units into the skin 3 (three) times daily with meals. 04/18/21   Setzer, Edman Circle, PA-C   potassium chloride SA (KLOR-CON M) 20 MEQ tablet Take 1 tablet (20 mEq total) by mouth 2 (two) times daily. 04/18/21   Setzer, Edman Circle, PA-C  rosuvastatin (CRESTOR) 40 MG tablet Take 40 mg by mouth daily. 02/09/22   [provider]  traZODone (DESYREL) 50 MG tablet TAKE 1 TABLET BY MOUTH EVERYDAY AT BEDTIME 08/16/21   Meredith Staggers, MD  TRULICITY 3 0000000 SOPN Inject 3 mg into the skin every Thursday. 07/22/19   [provider]  valsartan (DIOVAN) 320 MG tablet Take 1 tablet (320 mg total) by mouth daily. 04/18/21   Setzer, Edman Circle, PA-C  vitamin B-12 (CYANOCOBALAMIN) 500 MCG tablet Take 500 mcg by mouth daily.    [provider]  Vitamin D, Ergocalciferol, (DRISDOL) 50000 UNITS CAPS capsule Take 50,000 Units by mouth every Sunday. sunday     [provider]      Allergies    Patient has no known allergies.    Review of Systems   Review of Systems  Constitutional:  Negative for appetite change, chills and fever.  Respiratory:  Negative for cough, chest tightness and shortness of breath.   Cardiovascular:  Positive for chest pain. Negative for palpitations.  Genitourinary:  Positive for frequency.  Neurological:  Positive for headaches. Negative for weakness.  All other systems reviewed and are negative.   Physical Exam Updated Vital Signs BP 138/70   Pulse (!) 55   Temp 97.6 F (36.4 C) (Oral)   Resp 16   SpO2 98%  Physical Exam Vitals reviewed.  Constitutional:      Appearance: He is well-developed.  HENT:     Head: Normocephalic and atraumatic.  Cardiovascular:     Rate and Rhythm: Normal rate and regular rhythm.     Heart sounds: Normal heart sounds.  Pulmonary:     Effort: Pulmonary effort is normal.  Musculoskeletal:     Cervical back: Normal range of motion.  Neurological:     Mental Status: He is alert.     ED Results / Procedures / Treatments   Labs (all labs ordered are listed, but only abnormal results are  displayed) Labs Reviewed  URINALYSIS, ROUTINE W REFLEX MICROSCOPIC - Abnormal; Notable for the following components:      Result Value   Color, Urine STRAW (*)    Glucose, UA >=500 (*)    Ketones, ur 5 (*)    Protein, ur 30 (*)    All other components within normal limits  TROPONIN I (HIGH SENSITIVITY) - Abnormal; Notable for the following components:   Troponin I (High Sensitivity) 20 (*)    All other components within normal limits  MRSA NEXT GEN BY PCR, NASAL  CBC WITH DIFFERENTIAL/PLATELET  HIV ANTIBODY (ROUTINE TESTING W REFLEX)  COMPREHENSIVE METABOLIC PANEL    EKG EKG Interpretation  Date/Time:  Wednesday May 02 2022 13:44:28 EST Ventricular Rate:  63 PR Interval:  275 QRS Duration: 152 QT Interval:  436 QTC Calculation: 447 R Axis:   -86 Text Interpretation: Sinus rhythm Prolonged PR interval 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 No STEMI Confirmed by Antony Blackbird [phone]) on 05/02/2022 4:05:55 PM  Radiology CT Head Wo Contrast  Result Date: 05/02/2022 CLINICAL DATA:  Headache.  Worsening frequency or severity. EXAM: CT HEAD WITHOUT CONTRAST TECHNIQUE: Contiguous axial images were obtained from the base of the skull through the vertex without intravenous contrast. RADIATION DOSE REDUCTION: This exam was performed according to the departmental dose-optimization program which includes automated exposure control, adjustment of the mA and/or kV according to patient size and/or use of iterative reconstruction technique. COMPARISON:  MRI 07/11/2021.  CT 03/11/2021. FINDINGS: Brain: Acute hemorrhagic infarction affecting the right posterior parietal lobe. The region of infarction measures a proximally 7.6 x 3.6 x 4.1 cm. There is petechial bleeding through the region of infarction, without a frank focal hematoma. Old infarctions at the right frontoparietal vertex and right occipital lobe are present as seen previously. No visible subarachnoid blood today. No  midline shift. No hydrocephalus. Vascular: There is atherosclerotic calcification of the major vessels at the base of the brain. Skull: Negative Sinuses/Orbits: Clear/normal Other: None IMPRESSION: 1. Acute hemorrhagic infarction affecting the right posterior parietal lobe. The region of infarction measures 7.6 x 3.6 x 4.1 cm. There is petechial bleeding through the region of infarction, without a frank focal hematoma. No midline shift. 2. Old infarctions at the right frontoparietal vertex and right occipital lobe as seen previously. 3. Critical Value/emergent results were called by telephone at the time of interpretation on 05/02/2022 at 2:55 pm to provider EDP , who verbally acknowledged these results. Electronically Signed   By: Nelson Chimes M.D.   On: 05/02/2022 15:03   DG Chest Port 1 View  Result Date: 05/02/2022 CLINICAL DATA:  Chest pain EXAM: PORTABLE CHEST 1 VIEW COMPARISON:  Chest x-ray dated July 27th 2021 FINDINGS: The heart size and mediastinal contours are within normal limits. Bibasilar atelectasis. Both lungs are clear. The visualized skeletal structures are unremarkable. IMPRESSION: No active disease. Electronically Signed   By: Yetta Glassman M.D.   On: 05/02/2022 14:00    Procedures Procedures   Medications Ordered in ED Medications  clevidipine (CLEVIPREX) infusion 0.5 mg/mL (4 mg/hr Intravenous Rate/Dose Change 05/02/22 1549)   stroke: early stages of recovery book (has no administration in time range)  acetaminophen (TYLENOL) tablet 650 mg (has no administration in time range)    Or  acetaminophen (TYLENOL) 160 MG/5ML solution 650 mg (has no administration in time range)    Or  acetaminophen (TYLENOL) suppository 650 mg (has no administration in time range)  senna-docusate (Senokot-S) tablet 1 tablet (has no administration in time range)  pantoprazole (PROTONIX) injection 40 mg (has no administration in time range)    ED Course/ Medical Decision Making/ A&P Clinical  Course as of 05/02/22 1822  Wed May 02, 2022  1527 Evaluated at bedside.  GCS 15 at this time.  Headache since last night.  CT scan with large parenchymal hemorrhage.  Started on Cleviprex, neurology consultation.  Frequent reassessments.  Plan for admission for further care and management. [CC]    Clinical Course User Index [CC] Tretha Sciara, MD                           Medical Decision Making Amount and/or Complexity of Data Reviewed Labs: ordered. Radiology: ordered.  Risk Decision regarding hospitalization.   This patient presents to the ED for concern of ACS, intracranial hemorrhage, this involves an extensive number of treatment options, and is a complaint that carries with it a high risk of complications and morbidity.  The differential diagnosis includes STEMI, NSTEMI, stroke, viral URI   Co morbidities that complicate the patient evaluation  History of subarachnoid hemorrhage, intracerebral hemorrhage, uncontrolled type 2 diabetes, essential hypertension, morbid obesity   Lab Tests:  I Ordered, and personally interpreted labs.  The pertinent results include: Normal CBC, negative urinalysis.  Pending CMP and troponin.   Imaging Studies ordered:  I ordered imaging studies including chest x-ray, CT head I independently visualized and interpreted imaging which showed no acute cardiopulmonary process, acute hemorrhagic infarction of the right posterior parietal lobe I agree with the radiologist interpretation   Cardiac Monitoring: / EKG:  The patient was maintained on a cardiac monitor.  I personally viewed and interpreted the cardiac monitored which showed an underlying rhythm of: Sinus rhythm with prolonged PR interval, not first-degree AV block   Consultations Obtained:  I requested consultation with the neurologist, and discussed lab and imaging findings as well as pertinent plan - they recommend: Inpatient admission for further evaluation and management of  acute hemorrhagic stroke   Problem List / ED Course / Critical interventions / Medication management  Patient presented to the emergency department complaining of chest pain and headache.  He reports that chest pain began this morning woke him out of bed.  He also reports that his headache has been worsening since this morning.  Patient reports that he was given aspirin by EMS but no nitroglycerin due to concern about this plus make his headache worse.  Patient reports that he did take his blood pressure medication today.  Patient has a significant history for past strokes.  On initial assessment, patient appears to be neurologically intact without any acute deficits seen.  Patient does appear to have some difficulty with memory recall and some word finding, no acute findings such as aphasia.  Lab workup otherwise unremarkable CT head did show concern for an acute hemorrhagic stroke of the right posterior parietal lobe.  Consulted Dr. Leonel Ramsay with neurology for consideration of admission given patient's presentation.  Dr. Leonel Ramsay was agreeable to plan to admit for further intervention. 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 Leonel Ramsay agreeing  Final Clinical Impression(s) / ED Diagnoses Final diagnoses:  Acute hemorrhagic infarction of brain [facility])    Rx / DC Orders ED Discharge Orders     None         Luvenia Heller, PA-C 05/02/22 1823    Tretha Sciara, MD 05/03/22 0840

## 2022-05-03 ENCOUNTER — Inpatient Hospital Stay (HOSPITAL_COMMUNITY): Payer: BC Managed Care – PPO

## 2022-05-03 ENCOUNTER — Ambulatory Visit: Payer: BC Managed Care – PPO | Admitting: Physical Therapy

## 2022-05-03 DIAGNOSIS — I639 Cerebral infarction, unspecified: Secondary | ICD-10-CM

## 2022-05-03 DIAGNOSIS — I63511 Cerebral infarction due to unspecified occlusion or stenosis of right middle cerebral artery: Secondary | ICD-10-CM | POA: Diagnosis not present

## 2022-05-03 LAB — ECHOCARDIOGRAM COMPLETE
Area-P 1/2: 3.08 cm2
S' Lateral: 2.8 cm

## 2022-05-03 LAB — GLUCOSE, CAPILLARY
Glucose-Capillary: 117 mg/dL — ABNORMAL HIGH (ref 70–99)
Glucose-Capillary: 117 mg/dL — ABNORMAL HIGH (ref 70–99)
Glucose-Capillary: 174 mg/dL — ABNORMAL HIGH (ref 70–99)
Glucose-Capillary: 227 mg/dL — ABNORMAL HIGH (ref 70–99)

## 2022-05-03 LAB — RAPID URINE DRUG SCREEN, HOSP PERFORMED
Amphetamines: NOT DETECTED
Barbiturates: NOT DETECTED
Benzodiazepines: NOT DETECTED
Cocaine: NOT DETECTED
Opiates: NOT DETECTED
Tetrahydrocannabinol: NOT DETECTED

## 2022-05-03 LAB — PROTIME-INR
INR: 1.2 (ref 0.8–1.2)
Prothrombin Time: 15.4 seconds — ABNORMAL HIGH (ref 11.4–15.2)

## 2022-05-03 LAB — HEMOGLOBIN A1C
Hgb A1c MFr Bld: 8.3 % — ABNORMAL HIGH (ref 4.8–5.6)
Mean Plasma Glucose: 191.51 mg/dL

## 2022-05-03 LAB — MRSA NEXT GEN BY PCR, NASAL: MRSA by PCR Next Gen: DETECTED — AB

## 2022-05-03 LAB — APTT: aPTT: 31 seconds (ref 24–36)

## 2022-05-03 MED ORDER — HYDRALAZINE HCL 20 MG/ML IJ SOLN
20.0000 mg | INTRAMUSCULAR | Status: DC | PRN
  Administered 2022-05-03 – 2022-05-06 (×2): 20 mg via INTRAVENOUS
  Filled 2022-05-03 (×2): qty 1

## 2022-05-03 MED ORDER — HYDROCHLOROTHIAZIDE 25 MG PO TABS
25.0000 mg | ORAL_TABLET | Freq: Every day | ORAL | Status: DC
  Administered 2022-05-03 – 2022-05-07 (×5): 25 mg via ORAL
  Filled 2022-05-03 (×5): qty 1

## 2022-05-03 MED ORDER — MUPIROCIN 2 % EX OINT
1.0000 | TOPICAL_OINTMENT | Freq: Two times a day (BID) | CUTANEOUS | Status: DC
  Administered 2022-05-03 – 2022-05-07 (×8): 1 via NASAL
  Filled 2022-05-03 (×3): qty 22

## 2022-05-03 MED ORDER — CHLORHEXIDINE GLUCONATE CLOTH 2 % EX PADS
6.0000 | MEDICATED_PAD | Freq: Every day | CUTANEOUS | Status: DC
  Administered 2022-05-04 – 2022-05-07 (×4): 6 via TOPICAL

## 2022-05-03 MED ORDER — CHLORHEXIDINE GLUCONATE CLOTH 2 % EX PADS
6.0000 | MEDICATED_PAD | Freq: Every day | CUTANEOUS | Status: DC
  Administered 2022-05-03: 6 via TOPICAL

## 2022-05-03 MED ORDER — PANTOPRAZOLE SODIUM 40 MG PO TBEC
40.0000 mg | DELAYED_RELEASE_TABLET | Freq: Every day | ORAL | Status: DC
  Administered 2022-05-03 – 2022-05-06 (×4): 40 mg via ORAL
  Filled 2022-05-03 (×4): qty 1

## 2022-05-03 MED ORDER — PERFLUTREN LIPID MICROSPHERE
1.0000 mL | INTRAVENOUS | Status: AC | PRN
  Administered 2022-05-03: 4 mL via INTRAVENOUS

## 2022-05-03 NOTE — Progress Notes (Signed)
Lower extremity venous bilateral study completed.   Please see CV Proc for preliminary results.   Chany Woolworth, RDMS, RVT  

## 2022-05-03 NOTE — Progress Notes (Addendum)
STROKE TEAM PROGRESS NOTE   INTERVAL HISTORY No family at the bedside. Still has left sided residual from previous stroke.  Came to the hospital due to a headache, but does not notice any additional weakness.  Likely not a candidate for anticoagulation in the future given 2 episodes of spontaneous hemorrhagic transformation into his strokes, will not pursue loop recorder.  MRI scan shows large right temporoparietal hemorrhagic infarct with cytotoxic edema with mild mass effect on right lateral ventricle.  2 mm right to left shift.  He appears neurologically stable.  Blood pressure adequately controlled. Vitals:   05/03/22 0615 05/03/22 0630 05/03/22 0645 05/03/22 0700  BP: (!) 140/67 139/66 130/65 123/63  Pulse: (!) 55 (!) 52 (!) 53 (!) 53  Resp: 18 14 15 13  $ Temp:      TempSrc:      SpO2: 99% 100% 100% 100%   CBC:  Recent Labs  Lab 05/02/22 1414  WBC 5.9  NEUTROABS 4.4  HGB 14.0  HCT 41.3  MCV 94.5  PLT [id number]   Basic Metabolic Panel:  Recent Labs  Lab 05/02/22 1845  NA 137  K 3.8  CL 100  CO2 26  GLUCOSE 159*  BUN 11  CREATININE 0.80  CALCIUM 9.1   Lipid Panel: No results for input(s): "CHOL", "TRIG", "HDL", "CHOLHDL", "VLDL", "LDLCALC" in the last 168 hours. HgbA1c: No results for input(s): "HGBA1C" in the last 168 hours. Urine Drug Screen: No results for input(s): "LABOPIA", "COCAINSCRNUR", "LABBENZ", "AMPHETMU", "THCU", "LABBARB" in the last 168 hours.  Alcohol Level No results for input(s): "ETH" in the last 168 hours.  IMAGING past 24 hours MR BRAIN WO CONTRAST  Result Date: 05/02/2022 CLINICAL DATA:  Stroke suspected EXAM: MRI HEAD WITHOUT CONTRAST MRA HEAD WITHOUT CONTRAST MRA NECK WITHOUT CONTRAST TECHNIQUE: Multiplanar, multi-echo pulse sequences of the brain and surrounding structures were acquired without intravenous contrast. Angiographic images of the Circle of Willis were acquired using MRA technique without intravenous contrast. Angiographic images of the  neck were acquired using MRA technique without intravenous contrast. Carotid stenosis measurements (when applicable) are obtained utilizing NASCET criteria, using the distal internal carotid diameter as the denominator. COMPARISON:  07/11/2021 MRI head, no prior MRA, correlation is made with CT head 05/02/2022, CTA head 03/11/2021, and CTA neck 03/14/2021 FINDINGS: Evaluation is limited by motion artifact. In addition, the MRI head could not be completed. Axial T1 and susceptibility weighted sequences could not be obtained. MRI HEAD FINDINGS Brain: Redemonstrated area of infarction with hemorrhage in the right temporoparietal region, which appears overall similar to the same day CT, when allowing for differences in technique. Surrounding T2 hyperintense signal, likely a combination of cytotoxic and vasogenic edema. Mass effect on the right lateral ventricle, particularly the atrium and occipital horn, without evidence of entrapment of the temporal horn. 2 mm right-to-left midline shift. Vascular: Unable to evaluate the arterial flow voids due to motion. Skull and upper cervical spine: Grossly normal marrow signal. Sinuses/Orbits: Unable to evaluate. Other: None. MRA HEAD FINDINGS Anterior circulation: Both internal carotid arteries are patent to the termini, without significant stenosis. A1 segments patent A 1 and M 1 segments. Evaluation of the distal vessels is limited by motion, but they appear patent. Patent. Normal anterior communicating artery. Anterior cerebral arteries are patent to their distal aspects. No M1 stenosis or occlusion. Normal MCA bifurcations. Distal MCA branches perfused and symmetric. Posterior circulation: Vertebral arteries patent to the vertebrobasilar junction without stenosis. Basilar patent to its distal aspect. Superior cerebellar arteries  patent bilaterally. Patent P1 segments. Suspect severe stenosis in the left P2 (series 2, image 91), although evaluation is limited by motion. No  evidence aneurysm or vascular malformation. Anatomic variants: None significant MRA NECK FINDINGS No evidence of hemodynamically significant stenosis, dissection, or occlusion of the imaged portions the carotid and vertebral arteries. IMPRESSION: 1. Evaluation is limited by motion artifact. In addition, the MRI head could not be completed in its entirety. 2. Redemonstrated area of infarction with hemorrhage in the right temporoparietal region, which appears overall similar to the same day CT, when allowing for differences in technique. Edema causes mass effect on the right lateral ventricle and approximately 2 mm right-to-left midline shift. 3. No intracranial large vessel occlusion. Suspect severe stenosis in the left P2, although evaluation is limited by motion. 4. No hemodynamically significant stenosis in the neck. Electronically Signed   By: Merilyn Baba M.D.   On: 05/02/2022 22:39   MR ANGIO HEAD WO CONTRAST  Result Date: 05/02/2022 CLINICAL DATA:  Stroke suspected EXAM: MRI HEAD WITHOUT CONTRAST MRA HEAD WITHOUT CONTRAST MRA NECK WITHOUT CONTRAST TECHNIQUE: Multiplanar, multi-echo pulse sequences of the brain and surrounding structures were acquired without intravenous contrast. Angiographic images of the Circle of Willis were acquired using MRA technique without intravenous contrast. Angiographic images of the neck were acquired using MRA technique without intravenous contrast. Carotid stenosis measurements (when applicable) are obtained utilizing NASCET criteria, using the distal internal carotid diameter as the denominator. COMPARISON:  07/11/2021 MRI head, no prior MRA, correlation is made with CT head 05/02/2022, CTA head 03/11/2021, and CTA neck 03/14/2021 FINDINGS: Evaluation is limited by motion artifact. In addition, the MRI head could not be completed. Axial T1 and susceptibility weighted sequences could not be obtained. MRI HEAD FINDINGS Brain: Redemonstrated area of infarction with hemorrhage  in the right temporoparietal region, which appears overall similar to the same day CT, when allowing for differences in technique. Surrounding T2 hyperintense signal, likely a combination of cytotoxic and vasogenic edema. Mass effect on the right lateral ventricle, particularly the atrium and occipital horn, without evidence of entrapment of the temporal horn. 2 mm right-to-left midline shift. Vascular: Unable to evaluate the arterial flow voids due to motion. Skull and upper cervical spine: Grossly normal marrow signal. Sinuses/Orbits: Unable to evaluate. Other: None. MRA HEAD FINDINGS Anterior circulation: Both internal carotid arteries are patent to the termini, without significant stenosis. A1 segments patent A 1 and M 1 segments. Evaluation of the distal vessels is limited by motion, but they appear patent. Patent. Normal anterior communicating artery. Anterior cerebral arteries are patent to their distal aspects. No M1 stenosis or occlusion. Normal MCA bifurcations. Distal MCA branches perfused and symmetric. Posterior circulation: Vertebral arteries patent to the vertebrobasilar junction without stenosis. Basilar patent to its distal aspect. Superior cerebellar arteries patent bilaterally. Patent P1 segments. Suspect severe stenosis in the left P2 (series 2, image 91), although evaluation is limited by motion. No evidence aneurysm or vascular malformation. Anatomic variants: None significant MRA NECK FINDINGS No evidence of hemodynamically significant stenosis, dissection, or occlusion of the imaged portions the carotid and vertebral arteries. IMPRESSION: 1. Evaluation is limited by motion artifact. In addition, the MRI head could not be completed in its entirety. 2. Redemonstrated area of infarction with hemorrhage in the right temporoparietal region, which appears overall similar to the same day CT, when allowing for differences in technique. Edema causes mass effect on the right lateral ventricle and  approximately 2 mm right-to-left midline shift. 3. No intracranial  large vessel occlusion. Suspect severe stenosis in the left P2, although evaluation is limited by motion. 4. No hemodynamically significant stenosis in the neck. Electronically Signed   By: Merilyn Baba M.D.   On: 05/02/2022 22:39   MR ANGIO NECK WO CONTRAST  Result Date: 05/02/2022 CLINICAL DATA:  Stroke suspected EXAM: MRI HEAD WITHOUT CONTRAST MRA HEAD WITHOUT CONTRAST MRA NECK WITHOUT CONTRAST TECHNIQUE: Multiplanar, multi-echo pulse sequences of the brain and surrounding structures were acquired without intravenous contrast. Angiographic images of the Circle of Willis were acquired using MRA technique without intravenous contrast. Angiographic images of the neck were acquired using MRA technique without intravenous contrast. Carotid stenosis measurements (when applicable) are obtained utilizing NASCET criteria, using the distal internal carotid diameter as the denominator. COMPARISON:  07/11/2021 MRI head, no prior MRA, correlation is made with CT head 05/02/2022, CTA head 03/11/2021, and CTA neck 03/14/2021 FINDINGS: Evaluation is limited by motion artifact. In addition, the MRI head could not be completed. Axial T1 and susceptibility weighted sequences could not be obtained. MRI HEAD FINDINGS Brain: Redemonstrated area of infarction with hemorrhage in the right temporoparietal region, which appears overall similar to the same day CT, when allowing for differences in technique. Surrounding T2 hyperintense signal, likely a combination of cytotoxic and vasogenic edema. Mass effect on the right lateral ventricle, particularly the atrium and occipital horn, without evidence of entrapment of the temporal horn. 2 mm right-to-left midline shift. Vascular: Unable to evaluate the arterial flow voids due to motion. Skull and upper cervical spine: Grossly normal marrow signal. Sinuses/Orbits: Unable to evaluate. Other: None. MRA HEAD FINDINGS  Anterior circulation: Both internal carotid arteries are patent to the termini, without significant stenosis. A1 segments patent A 1 and M 1 segments. Evaluation of the distal vessels is limited by motion, but they appear patent. Patent. Normal anterior communicating artery. Anterior cerebral arteries are patent to their distal aspects. No M1 stenosis or occlusion. Normal MCA bifurcations. Distal MCA branches perfused and symmetric. Posterior circulation: Vertebral arteries patent to the vertebrobasilar junction without stenosis. Basilar patent to its distal aspect. Superior cerebellar arteries patent bilaterally. Patent P1 segments. Suspect severe stenosis in the left P2 (series 2, image 91), although evaluation is limited by motion. No evidence aneurysm or vascular malformation. Anatomic variants: None significant MRA NECK FINDINGS No evidence of hemodynamically significant stenosis, dissection, or occlusion of the imaged portions the carotid and vertebral arteries. IMPRESSION: 1. Evaluation is limited by motion artifact. In addition, the MRI head could not be completed in its entirety. 2. Redemonstrated area of infarction with hemorrhage in the right temporoparietal region, which appears overall similar to the same day CT, when allowing for differences in technique. Edema causes mass effect on the right lateral ventricle and approximately 2 mm right-to-left midline shift. 3. No intracranial large vessel occlusion. Suspect severe stenosis in the left P2, although evaluation is limited by motion. 4. No hemodynamically significant stenosis in the neck. Electronically Signed   By: Merilyn Baba M.D.   On: 05/02/2022 22:39   CT Head Wo Contrast  Result Date: 05/02/2022 CLINICAL DATA:  Headache.  Worsening frequency or severity. EXAM: CT HEAD WITHOUT CONTRAST TECHNIQUE: Contiguous axial images were obtained from the base of the skull through the vertex without intravenous contrast. RADIATION DOSE REDUCTION: This  exam was performed according to the departmental dose-optimization program which includes automated exposure control, adjustment of the mA and/or kV according to patient size and/or use of iterative reconstruction technique. COMPARISON:  MRI 07/11/2021.  CT  03/11/2021. FINDINGS: Brain: Acute hemorrhagic infarction affecting the right posterior parietal lobe. The region of infarction measures a proximally 7.6 x 3.6 x 4.1 cm. There is petechial bleeding through the region of infarction, without a frank focal hematoma. Old infarctions at the right frontoparietal vertex and right occipital lobe are present as seen previously. No visible subarachnoid blood today. No midline shift. No hydrocephalus. Vascular: There is atherosclerotic calcification of the major vessels at the base of the brain. Skull: Negative Sinuses/Orbits: Clear/normal Other: None IMPRESSION: 1. Acute hemorrhagic infarction affecting the right posterior parietal lobe. The region of infarction measures 7.6 x 3.6 x 4.1 cm. There is petechial bleeding through the region of infarction, without a frank focal hematoma. No midline shift. 2. Old infarctions at the right frontoparietal vertex and right occipital lobe as seen previously. 3. Critical Value/emergent results were called by telephone at the time of interpretation on 05/02/2022 at 2:55 pm to provider EDP , who verbally acknowledged these results. Electronically Signed   By: Nelson Chimes M.D.   On: 05/02/2022 15:03   DG Chest Port 1 View  Result Date: 05/02/2022 CLINICAL DATA:  Chest pain EXAM: PORTABLE CHEST 1 VIEW COMPARISON:  Chest x-ray dated July 27th 2021 FINDINGS: The heart size and mediastinal contours are within normal limits. Bibasilar atelectasis. Both lungs are clear. The visualized skeletal structures are unremarkable. IMPRESSION: No active disease. Electronically Signed   By: Yetta Glassman M.D.   On: 05/02/2022 14:00    PHYSICAL EXAM  Physical Exam  Constitutional: Appears  well-developed and well-nourished middle-age Caucasian male.   Cardiovascular: Normal rate and regular rhythm.  Respiratory: Effort normal, non-labored breathing Left side swollen  Neuro: Mental Status: Patient is awake, alert, oriented to person, place, month, year, and situation. Patient is able to give a clear and coherent history. No signs of aphasia or neglect Cranial Nerves: II: Field cut left decreased blink to threat III,IV, VI: EOMI without ptosis or diploplia.  V: Facial sensation is symmetric to temperature VII: Facial movement is symmetric resting and smiling VIII: Hearing is intact to voice X: Palate elevates symmetrically XI: Shoulder shrug is symmetric. XII: Tongue protrudes midline without atrophy or fasciculations.  Motor: Tone is normal. Bulk is normal. RUE 5/5 LUE 3/5 RLE 5/5 LLE 4+/5 Sensory: Sensation is symmetric to light touch and temperature in the arms and legs. No extinction to DSS present.  Cerebellar: FNF and HKS are intact on the right.  Unable to complete with LUE        ASSESSMENT/PLAN [name] is a 67 y.o. male with history of previous stroke with left-sided weakness who presents with headache that started yesterday as well as some chest pain that started overnight.  Due to his headache he had a head CT obtained which shows a fairly large hemorrhagic infarct.  He denies any change in his left side, states that he has been continuing to work with rehab since his stroke in December 2022.  He feels that the left side has been improving.   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 The region of infarction measures 7.6 x 3.6 x 4.1 cm. MRI - Redemonstrated area of infarction with hemorrhage in the right temporoparietal region, which appears overall similar  to the same day CT, when allowing for differences in technique. Edema causes mass effect on the  right lateral ventricle and approximately 2 mm right-to-left midline shift. MRA  Suspect severe stenosis in the left P2, although evaluation is limited by motion. PT/INR 15.4/1.2 PTT 31 PLT 214 2D Echo EF 50-55% LDL 57 HgbA1c 7.8 VTE prophylaxis - SCDs    Diet   Diet heart healthy/carb modified Room service appropriate? Yes with Assist; Fluid consistency: Thin   aspirin 81 mg daily prior to admission, now on No antithrombotic.  Due to hemorrhagic transformation Therapy recommendations:  pending  Disposition:  pending     Hypertension Congestive Heart Failure Home meds:  Coreg, lasix, hydrochlorothiazide, lopressor Stable Cleviprex- weaning off  PRN Hydralazine   Hyperlipidemia Home meds:  Crestor 91m, resumed in hospital LDL 57, goal < 70 Continue statin at discharge  Diabetes type II Uncontrolled Home meds:  Farxiga, insulin, trulicity H1234567.8, goal < 7.0 (Previous 11.1) CBGs Recent Labs    05/02/22 2257  GLUCAP 151*    SSI  Other Stroke Risk Factors Previous stroke  Stroke with HT - Right frontal infarct with small HT and SAH, etiology unclear, could be due to atherosclerosis given multiple uncontrolled risk factors. However, cardioembolic source can not be ruled out   Stroke - new right PCA infarct, etiology could be large vessel atherosclerosis given embolic calcified plaque at right P3 from right VA origin or subclavian artery.  Obesity, Body mass index is 37.72 kg/m., BMI >/= 30 associated with increased stroke risk, recommend weight loss, diet and exercise as appropriate  Obstructive sleep apnea - Home BiPAP at night and with naps  Other Active Problems   Hospital day # 1  Patient seen and examined by NP/APP with MD. MD to update note as needed.   DJanine Ores DNP, FNP-BC [facility] Pager: [phone] I have personally obtained  history,examined this patient, reviewed notes, independently viewed imaging studies, participated in medical decision making and plan of care.ROS completed by me personally and pertinent positives fully documented  I have made any additions or clarifications directly to the above note. Agree with note above.  Patient presented with headache and MRI and CT scan showed large right temporoparietal hemorrhagic infarct.  He has previously had right occipital hemorrhagic infarct as well.  Recommend hold aspirin, cardiac monitoring and continue ongoing stroke workup and aggressive risk factor modification.  Keep systolic blood pressure below 160 and close neurological monitoring..Starleen Blueout of bed.  Therapy consults.  Will transfer out of ICU tomorrow if stable.This patient is critically ill and at significant risk of neurological worsening, death and care requires constant monitoring of vital signs, hemodynamics,respiratory and cardiac monitoring, extensive review of multiple databases, frequent neurological assessment, discussion with family, other specialists and medical decision making of high complexity.I have made any additions or clarifications directly to the above note.This critical care time does not reflect procedure time, or teaching time or supervisory time of PA/NP/Med Resident etc but could involve care discussion time.  I spent 30 minutes of neurocritical care time  in the care of  this patient.      PAntony Contras MD Medical Director [facility] Pager: [phone]/15/2024 2:59 PM  To contact Stroke Continuity provider, please refer to [email or web address] After hours, contact General Neurology

## 2022-05-03 NOTE — Progress Notes (Signed)
SLP Cancellation Note  Patient Details Name: Boleslaw Markowitz MRN: [id number] DOB: [birth year]   Cancelled treatment:       Reason Eval/Treat Not Completed: Patient at procedure or test/unavailable; pt with PT/OT when evaluation attempted. ST will continue efforts.   Pat Enas Winchel,M.S., CCC-SLP 05/03/2022, 2:33 PM

## 2022-05-03 NOTE — Evaluation (Signed)
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  Admitted due to HA, CP, and found to have hemorrhage in the  right temporoparietal region with 29m L to R midline shift.  Clinical Impression  Patient presents with decreased mobility due to generalized weakness and residual L hemiparesis from prior stroke.  He was previously able to ambulate with RW and denies any falls at home.  Currently needing min A for safety with L foot dragging and LOB x 2 while walking around the bed to the recliner.  Has been in bed since admission so hopeful to see improvement prior to d/c home though patient denies any changes in his mobility.  PT will continue to follow.        Recommendations for follow up therapy are one component of a multi-disciplinary discharge planning process, led by the attending physician.  Recommendations may be updated based on patient status, additional functional criteria and insurance authorization.  Follow Up Recommendations Outpatient PT      Assistance Recommended at Discharge Intermittent Supervision/Assistance  Patient can return home with the following  A little help with walking and/or transfers;Assistance with cooking/housework;A little help with bathing/dressing/bathroom;Help with stairs or ramp for entrance    Equipment Recommendations None recommended by PT  Recommendations for Other Services       Functional Status Assessment Patient has had a recent decline in their functional status and demonstrates the ability to make significant improvements in function in a reasonable and predictable amount of time.     Precautions / Restrictions Precautions Precautions: Fall Precaution Comments: L weakness; BP <150      Mobility  Bed Mobility Overal bed mobility:  Needs Assistance Bed Mobility: Supine to Sit     Supine to sit: Min guard, HOB elevated     General bed mobility comments: increased time, assist for balance and lines, pt with difficulty scooting hips to EOB    Transfers Overall transfer level: Needs assistance Equipment used: Rolling walker (2 wheels) Transfers: Sit to/from Stand Sit to Stand: Min assist, From elevated surface           General transfer comment: increased time, decreased anterior weight shift, some bracing of LE's on bed for balance    Ambulation/Gait Ambulation/Gait assistance: Min assist, +2 safety/equipment Gait Distance (Feet): 15 Feet Assistive device: Rolling walker (2 wheels) Gait Pattern/deviations: Step-to pattern, Step-through pattern, Decreased stride length, Decreased dorsiflexion - left, Shuffle, Wide base of support       General Gait Details: decreased L foot clearance twice tripping over that foot while walking around the bed to the recliner, assist for safety and balance  Stairs            Wheelchair Mobility    Modified Rankin (Stroke Patients Only) Modified Rankin (Stroke Patients Only) Pre-Morbid Rankin Score: Moderate disability Modified Rankin: Moderately severe disability     Balance Overall balance assessment: Needs assistance   Sitting balance-Leahy Scale: Good     Standing balance support: Bilateral upper extremity supported Standing balance-Leahy Scale: Poor Standing balance comment: UE reliance and needing support initially due to posterior bias                             Pertinent Vitals/Pain Pain Assessment Pain Assessment:  0-10 Pain Score: 5  Pain Location: headache Pain Descriptors / Indicators: Headache Pain Intervention(s): Monitored during session    Home Living Family/patient expects to be discharged to:: Private residence Living Arrangements: Spouse/significant other Available Help at Discharge: Family;Available 24  hours/day Type of Home: House Home Access: Ramped entrance;Stairs to enter   Entrance Stairs-Number of Steps: ramp into the house then 2 steps with grabbar   Home Layout: One level Home Equipment: Conservation officer, [employer] (2 wheels);Shower seat;Grab bars - tub/shower;Grab bars - toilet;Rollator (4 wheels) Additional Comments: wife is in a wheelchair    Prior Function Prior Level of Function : Needs assist             Mobility Comments: reports walked limited household distances only ADLs Comments: wife assists some in shower     Hand Dominance   Dominant Hand: Right    Extremity/Trunk Assessment   Upper Extremity Assessment Upper Extremity Assessment: Defer to OT evaluation    Lower Extremity Assessment Lower Extremity Assessment: LLE deficits/detail LLE Deficits / Details: increased extensor tone noted, but AAROM grossly WFL, strength hip flexion 3-/5, knee extension 4-/5, ankle DF 2/5       Communication   Communication: No difficulties  Cognition Arousal/Alertness: Awake/alert Behavior During Therapy: WFL for tasks assessed/performed Overall Cognitive Status: Within Functional Limits for tasks assessed                                          General Comments General comments (skin integrity, edema, etc.): BP stable with parameter <150    Exercises     Assessment/Plan    PT Assessment Patient needs continued PT services  PT Problem List Decreased strength;Decreased balance;Decreased mobility;Decreased safety awareness;Decreased activity tolerance;Decreased knowledge of use of DME;Decreased knowledge of precautions       PT Treatment Interventions DME instruction;Functional mobility training;Balance training;Patient/family education;Therapeutic activities;Gait training;Therapeutic exercise    PT Goals (Current goals can be found in the Care Plan section)  Acute Rehab PT Goals Patient Stated Goal: to return home PT Goal Formulation: With  patient Time For Goal Achievement: 05/17/22 Potential to Achieve Goals: Good    Frequency Min 4X/week     Co-evaluation               AM-PAC PT "6 Clicks" Mobility  Outcome Measure Help needed turning from your back to your side while in a flat bed without using bedrails?: None Help needed moving from lying on your back to sitting on the side of a flat bed without using bedrails?: A Little Help needed moving to and from a bed to a chair (including a wheelchair)?: A Little Help needed standing up from a chair using your arms (e.g., wheelchair or bedside chair)?: A Little Help needed to walk in hospital room?: Total Help needed climbing 3-5 steps with a railing? 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

## 2022-05-03 NOTE — Evaluation (Signed)
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  Admitted due to HA, CP, and found to have hemorrhage in the  right temporoparietal region with 2m L to R midline shift.   Clinical Impression   Patient evaluated by Occupational Therapy with no further acute OT needs identified. Pt reports that he is functioning at his baseline prior to being admitted. Pt presents with left side weakness and ROM requiring min assist overall to complete BADL tasks and functional transfers. Recommend that patient discharge home with Wife providing assistance for bathing as usual and return to [facility] to continue OT services. All education has been completed and the patient has no further questions.  See below for any follow-up Occupational Therapy or equipment needs. OT is signing off. Thank you for this referral.       Recommendations for follow up therapy are one component of a multi-disciplinary discharge planning process, led by the attending physician.  Recommendations may be updated based on patient status, additional functional criteria and insurance authorization.   Follow Up Recommendations  Outpatient OT     Assistance Recommended at Discharge PRN  Patient can return home with the following A little help with walking and/or transfers;A little help with bathing/dressing/bathroom;Assist for transportation;Help with stairs or ramp for entrance;Assistance with cooking/housework    Functional Status Assessment  Patient has not had a recent decline in their functional status  Equipment Recommendations  None recommended by OT       Precautions / Restrictions Precautions Precautions: Fall Precaution Comments: L weakness; BP <150 Restrictions Weight Bearing Restrictions: No       Mobility Bed Mobility Overal bed mobility: Needs Assistance Bed Mobility: Supine to Sit     Supine to sit: Min guard, HOB elevated     General bed mobility comments: increased time, assist for balance and lines, pt with difficulty scooting hips to EOB Patient Response: Cooperative  Transfers Overall transfer level: Needs assistance Equipment used: Rolling walker (2 wheels) Transfers: Sit to/from Stand Sit to Stand: Min assist, From elevated surface           General transfer comment: increased time, decreased anterior weight shift, some bracing of LE's on bed for balance      Balance Overall balance assessment: Needs assistance Sitting-balance support: Feet supported, Single extremity supported Sitting balance-Leahy Scale: Good Sitting balance - Comments: seated EOB   Standing balance support: Bilateral upper extremity supported, Reliant on assistive device for balance Standing balance-Leahy Scale: Poor Standing balance comment: UE reliance and needing support initially due to posterior bias         ADL either performed or assessed with clinical judgement   ADL Overall ADL's : Needs assistance/impaired Eating/Feeding: Set up;Sitting Eating/Feeding Details (indicate cue type and reason): assist to open containers as needed Grooming: Set up;Sitting   Upper Body Bathing: Minimal assistance;Sitting Upper Body Bathing Details (indicate cue type and reason): Assist will be needed for the washing the right UE and side of body due to left hemi. Lower Body Bathing: Minimal assistance;Sitting/lateral leans   Upper Body Dressing : Minimal assistance;Sitting Upper Body Dressing Details (indicate cue type and reason): donning hospital gown while seated EOB Lower Body Dressing: Total assistance;Bed level Lower Body Dressing Details (indicate cue type and reason): donning hospital socks Toilet Transfer: Minimal assistance;Ambulation  Toilet Transfer Details (indicate cue type  and reason): simulated to recliner Toileting- Clothing Manipulation and Hygiene: Minimal assistance;Sitting/lateral lean         Vision Baseline Vision/History: 0 No visual deficits Ability to See in Adequate Light: 0 Adequate Patient Visual Report: No change from baseline Vision Assessment?: No apparent visual deficits            Pertinent Vitals/Pain Pain Assessment Pain Assessment: 0-10 Pain Score: 5  Pain Location: headache Pain Descriptors / Indicators: Headache Pain Intervention(s): Monitored during session     Hand Dominance Right   Extremity/Trunk Assessment Upper Extremity Assessment Upper Extremity Assessment: LUE deficits/detail;RUE deficits/detail RUE Deficits / Details: Full A/ROM with 5/5 strength in all ranges. RUE Coordination: WNL LUE Deficits / Details: History of CVA with left side involvement (hemiparesis). A/ROM shoulder flexion: ~5 degrees. elbow flexion/extension: WFL when arm is off loaded. Limited wrist extension with full wrist flexion. wrist supination ~25% range. Able to make a full fist and demonstrate a weak gross grasp. Able to demonstrate composite finger extension 75%. LUE Sensation: WNL LUE Coordination: decreased fine motor;decreased gross motor          Communication Communication Communication: No difficulties   Cognition Arousal/Alertness: Awake/alert Behavior During Therapy: WFL for tasks assessed/performed Overall Cognitive Status: Within Functional Limits for tasks assessed     General Comments  BP stable with parameter <150            Home Living Family/patient expects to be discharged to:: Private residence Living Arrangements: Spouse/significant other (Wife utilizes a wheelchair) Available Help at Discharge: Family;Available 24 hours/day Type of Home: House Home Access: Ramped entrance;Stairs to enter Entrance Stairs-Number of Steps: ramp into the house then 2 steps with grab bar   Home Layout: One level      Bathroom Shower/Tub: Occupational psychologist: Handicapped height Bathroom Accessibility: Yes How Accessible: Accessible via wheelchair;Accessible via walker Home Equipment: [place] (2 wheels);Shower seat;Grab bars - tub/shower;Grab bars - toilet;Rollator (4 wheels)   Additional Comments: wife is in a wheelchair      Prior Functioning/Environment Prior Level of Function : Needs assist     Mobility Comments: reports walked limited household distances only. Pt takes public transport to OP therapy appointments. ADLs Comments: wife assists some in shower        OT Problem List: Decreased coordination;Decreased strength;Impaired balance (sitting and/or standing);Impaired UE functional use      OT Treatment/Interventions:   Eval only   OT Goals(Current goals can be found in the care plan section) Acute Rehab OT Goals Patient Stated Goal: none stated  OT Frequency:  1 time visit    Co-evaluation PT/OT/SLP Co-Evaluation/Treatment: Yes Reason for Co-Treatment: To address functional/ADL transfers   OT goals addressed during session: ADL's and self-care;Strengthening/ROM;Proper use of Adaptive equipment and DME      AM-PAC OT "6 Clicks" Daily Activity     Outcome Measure Help from another person eating meals?: A Little Help from another person taking care of personal grooming?: A Little Help from another person toileting, which includes using toliet, bedpan, or urinal?: A Little Help from another person bathing (including washing, rinsing, drying)?: A Little Help from another person to put on and taking off regular upper body clothing?: A Little Help from another person to put on and taking off regular lower body clothing?: Total 6 Click Score: 16   End of Session Equipment Utilized During Treatment: Gait belt;Rolling walker (2 wheels)  Activity Tolerance: Patient tolerated treatment  well Patient left: in chair;with call bell/phone within reach;with chair alarm  set  OT Visit Diagnosis: Unsteadiness on feet (R26.81);Muscle weakness (generalized) (M62.81)                Time: [id number] OT Time Calculation (min): 22 min Charges:  OT General Charges $OT Visit: 1 Visit OT Evaluation $OT Eval Moderate Complexity: 1 Mod  [employer], OTR/L,CBIS  Supplemental OT - [facility] and [facility] Secure Chat Preferred    Adrieanna Boteler, Clarene Duke 05/03/2022, 1:36 PM

## 2022-05-03 NOTE — Progress Notes (Signed)
PT Cancellation Note  Patient Details Name: Joseph Ortega MRN: [id number] DOB: 01-21-1956   Cancelled Treatment:    Reason Eval/Treat Not Completed: Active bedrest order; will follow up to see if activity can be upgraded today.   Reginia Naas 05/03/2022, 9:01 AM Magda Kiel, PT Acute Rehabilitation Services Office:[phone] 05/03/2022

## 2022-05-04 ENCOUNTER — Other Ambulatory Visit: Payer: Self-pay

## 2022-05-04 DIAGNOSIS — I639 Cerebral infarction, unspecified: Secondary | ICD-10-CM

## 2022-05-04 DIAGNOSIS — I63031 Cerebral infarction due to thrombosis of right carotid artery: Secondary | ICD-10-CM

## 2022-05-04 LAB — GLUCOSE, CAPILLARY
Glucose-Capillary: 183 mg/dL — ABNORMAL HIGH (ref 70–99)
Glucose-Capillary: 215 mg/dL — ABNORMAL HIGH (ref 70–99)
Glucose-Capillary: 217 mg/dL — ABNORMAL HIGH (ref 70–99)
Glucose-Capillary: 181 mg/dL — ABNORMAL HIGH (ref 70–99)

## 2022-05-04 NOTE — Progress Notes (Signed)
Inpatient Rehab Admissions Coordinator Note:   Per updated PT recommendations patient was screened for CIR candidacy by Michel Santee, PT. At this time, pt appears to be a potential candidate for CIR. I will place an order for rehab consult for full assessment, per our protocol.  Please contact me any with questions.Shann Medal, PT, DPT [phone] 05/04/22 11:00 AM

## 2022-05-04 NOTE — Progress Notes (Signed)
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  Admitted due to HA, CP, and found to have hemorrhage in the  right temporoparietal region with 67m L to R midline shift.    PT Comments    Patient admitted today he is not at his baseline with L foot dragging more, struggling with getting up from bed and with sit to stand and uncontrolled descent with stand to sit.  His wife is in a wheelchair and cannot assist with mobility.  He is in agreement with updated recommendations for acute inpatient rehab prior to d/c home.  PT wll continue to follow. RN made aware.    Recommendations for follow up therapy are one component of a multi-disciplinary discharge planning process, led by the attending physician.  Recommendations may be updated based on patient status, additional functional criteria and insurance authorization.  Follow Up Recommendations  Acute inpatient rehab (3hours/day)     Assistance Recommended at Discharge Intermittent Supervision/Assistance  Patient can return home with the following A little help with walking and/or transfers;Assistance with cooking/housework;A little help with bathing/dressing/bathroom;Help with stairs or ramp for entrance   Equipment Recommendations  None recommended by PT    Recommendations for Other Services       Precautions / Restrictions Precautions Precautions: Fall Precaution Comments: L weakness; BP <150 Restrictions Weight Bearing Restrictions: No     Mobility  Bed Mobility Overal bed mobility: Needs Assistance Bed Mobility: Supine to Sit     Supine to sit: HOB elevated     General bed mobility comments: increased time and heavy R UE reliance (strugglint)    Transfers Overall transfer level: Needs assistance Equipment used:  Rolling walker (2 wheels) Transfers: Sit to/from Stand Sit to Stand: From elevated surface, Mod assist           General transfer comment: couple of attempts to stand, needing up to 30% help with some lifting    Ambulation/Gait Ambulation/Gait assistance: Min assist Gait Distance (Feet): 100 Feet Assistive device: Rolling walker (2 wheels) Gait Pattern/deviations: Step-to pattern, Decreased stride length, Shuffle, Decreased dorsiflexion - left, Wide base of support, Trunk flexed       General Gait Details: heavy UE reliance and struggling to keep L hand on walker with some dragging of L foot mod cues for foot clearance.  Stopped to stand at counter in hallway for walker adjustment   Stairs             Wheelchair Mobility    Modified Rankin (Stroke Patients Only) Modified Rankin (Stroke Patients Only) Pre-Morbid Rankin Score: Moderate disability Modified Rankin: Moderately severe disability     Balance Overall balance assessment: Needs assistance Sitting-balance support: Feet supported Sitting balance-Leahy Scale: Good     Standing balance support: Bilateral upper extremity supported, Reliant on assistive device for balance Standing balance-Leahy Scale: Poor Standing balance comment: UE reliance and needing support initially due to posterior bias                            Cognition Arousal/Alertness: Awake/alert Behavior During Therapy: WFL for tasks assessed/performed, Flat affect Overall Cognitive Status: Impaired/Different from baseline Area of Impairment: Safety/judgement, Problem solving, Attention  Current Attention Level: Selective         Problem Solving: Slow processing General Comments: initially reported he was close to baseline, today agrees he is having some difficulty        Exercises      General Comments General comments (skin integrity, edema, etc.): VSS SpO2 96%, HR 74, BP 125/62 after  ambulation      Pertinent Vitals/Pain Pain Assessment Pain Score: 7  Pain Location: headache Pain Descriptors / Indicators: Headache Pain Intervention(s): Monitored during session    Home Living                          Prior Function            PT Goals (current goals can now be found in the care plan section) Progress towards PT goals: Progressing toward goals    Frequency           PT Plan Discharge plan needs to be updated    Co-evaluation              AM-PAC PT "6 Clicks" Mobility   Outcome Measure  Help needed turning from your back to your side while in a flat bed without using bedrails?: A Little Help needed moving from lying on your back to sitting on the side of a flat bed without using bedrails?: A Little Help needed moving to and from a bed to a chair (including a wheelchair)?: A Little Help needed standing up from a chair using your arms (e.g., wheelchair or bedside chair)?: A Lot Help needed to walk in hospital room?: A Little Help needed climbing 3-5 steps with a railing? : Total 6 Click Score: 15    End of Session Equipment Utilized During Treatment: Gait belt Activity Tolerance: Patient limited by fatigue Patient left: in chair;with chair alarm set;with call bell/phone within reach Nurse Communication: Mobility status;Other (comment) (updated d/c recs) PT Visit Diagnosis: Other abnormalities of gait and mobility (R26.89);Hemiplegia and hemiparesis Hemiplegia - Right/Left: Left Hemiplegia - dominant/non-dominant: Non-dominant Hemiplegia - caused by: Other Nontraumatic intracranial hemorrhage     Time: 1002-1028 PT Time Calculation (min) (ACUTE ONLY): 26 min  Charges:  $Gait Training: 8-22 mins $Therapeutic Activity: 8-22 mins                     Magda Kiel, PT Acute Rehabilitation Services [phone] 05/04/2022    Joseph Ortega 05/04/2022, 10:37 AM

## 2022-05-04 NOTE — Evaluation (Signed)
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   ICH (intracerebral hemorrhage) ([place]) 03/17/2021   Hypothyroidism 03/12/2021   OSA (obstructive sleep apnea) 03/12/2021   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   Uncontrolled type 2 diabetes mellitus with hyperglycemia ([place]) 03/12/2021   Redness of both eyes 03/12/2021   Morbid obesity ([place]) 03/11/2021   Essential hypertension 03/11/2021   Tobacco abuse 03/11/2021   Herpes zoster with complication    SAH (subarachnoid hemorrhage) ([place]) 03/08/2021   Past Medical History:  Past Medical History:  Diagnosis Date   Diabetes mellitus without complication ([place])    Hypertension    Thyroid disease    Past Surgical History:  Past Surgical History:  Procedure Laterality Date   HERNIA REPAIR     IR ANGIO INTRA EXTRACRAN SEL COM CAROTID INNOMINATE BILAT MOD SED  03/09/2021   IR ANGIO VERTEBRAL SEL VERTEBRAL BILAT MOD SED  03/09/2021   IR US GUIDE VASC ACCESS RIGHT  03/09/2021   HPI:  Pt is a 67 y.o. male who presented with headache and chest pain. MRI brain 2/14: area of infarction with hemorrhage in the right temporoparietal region. Edema causes  mass effect on the right lateral ventricle and approximately 2 mm right-to-left midline shift. PMH: stroke with left-sided weakness.   Assessment / Plan / Recommendation Clinical Impression  Pt participated in speech-language-cognition evaluation. He reported that he resides with his wife, is currently on disability, and completed some college-level coursework. Pt stated that he has been having difficulty with memory for the past two weeks and his wife started managing the bills and his medications due to this difficulty. The [facility] Mental  Status Examination was completed to evaluate the pt's cognitive-linguistic skills. He achieved a score of 20/30 which is below the normal limits of 27 or more out of 30. This and informal measures revealed difficulty in the areas of temporal orientation, memory, and executive function as it relates to reasoning and complex problem solving. Motor speech and language skills were WFL, but mild articulatory precision was noted. Skilled SLP services are clinically indicated at this time to improve cognitive-linguistic function.    SLP Assessment  SLP Recommendation/Assessment: Patient needs continued Speech [place] [facility] SLP Visit Diagnosis: Cognitive communication deficit (R41.841)    Recommendations for follow up therapy are one component of a multi-disciplinary discharge planning process, led by the attending physician.  Recommendations may be updated based on patient status, additional functional criteria and insurance authorization.    Follow Up Recommendations  Acute inpatient rehab (3hours/day)    Assistance Recommended at Discharge     Functional Status Assessment Patient has had a recent decline in their functional status and demonstrates the ability to make significant improvements in function in a reasonable and predictable amount of time.  Frequency and Duration min 2x/week  2 weeks      SLP Evaluation Cognition  Overall Cognitive Status: Impaired/Different from baseline Arousal/Alertness: Awake/alert Orientation Level: Oriented to person;Oriented to place;Oriented to situation Year: 2023 Month: February Day of Week: Correct Attention: Focused;Sustained Focused Attention: Appears intact Sustained Attention: Appears intact Memory: Impaired Memory Impairment:  (Immediate: 5/5 with repetition, delayed: 4/5 with cues: 1/1) Awareness: Appears intact Problem Solving: Impaired Problem Solving Impairment: Verbal complex (money: 0/3; time: 1/2) Executive Function:  Sequencing;Organizing Sequencing: Impaired  Sequencing Impairment: Verbal complex (clock: 0/4) Organizing:  (backward digit span: 2/2)       Comprehension  Auditory Comprehension Overall Auditory Comprehension: Appears within functional limits for tasks assessed Yes/No Questions: Within Functional Limits Commands: Within Functional Limits Conversation: Complex    Expression Expression Primary Mode of Expression: Verbal Verbal Expression Overall Verbal Expression: Appears within functional limits for tasks assessed Initiation: No impairment Level of Generative/Spontaneous Verbalization: Conversation Repetition: No impairment Pragmatics: No impairment   Oral / Motor  Oral Motor/Sensory Function Overall Oral Motor/Sensory Function: Within functional limits Motor Speech Overall Motor Speech: Appears within functional limits for tasks assessed Respiration: Within functional limits Phonation: Normal Resonance: Within functional limits Articulation: Within functional limitis Intelligibility: Intelligible Motor Planning: Witnin functional limits           Joseph Ortega I. Joseph Ortega, Joseph Ortega, Joseph Ortega Office number [phone]  Joseph Ortega 05/04/2022, 11:19 AM

## 2022-05-04 NOTE — Evaluation (Signed)
Occupational Therapy Evaluation Patient Details Name: Joseph Ortega MRN: [id number] DOB: 1955-11-18 Today's Date: 05/04/2022   History of Present Illness Patient is a 67 y/o male with PMH DM, HTN, Thyroid disease and prior CVA 12/22 with residual L side weakness.  Admitted due to HA, CP, and found to have hemorrhage in the  right temporoparietal region with 71m L to R midline shift.   Clinical Impression   Pt seen for a re-eval this date per PT's recommendation. During PT's session, pt verbalized that he wasn't close to his baseline; his left leg was dragging more than it usually does, he noted left side visual deficits, and he is not able to use his left hand as he was previously. 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 Unable to complete 9 hole peg test with the left hand. Pt reports that he was able to use his left hand to pick up items and hold onto cups prior to admit. Due to this new information and the fact that patient is not close to his baseline he would benefit from CIR at discharge prior to returning home and resuming OP therapy services. Pt agrees with recommendation. Acute OT will continue to follow.      Recommendations for follow up therapy are one component of a multi-disciplinary discharge planning process, led by the attending physician.  Recommendations may be updated based on patient status, additional functional criteria and insurance authorization.   Follow Up Recommendations  Acute inpatient rehab (3hours/day)     Assistance Recommended at Discharge Intermittent Supervision/Assistance  Patient can return home with the following A little help with walking and/or transfers;A little help with bathing/dressing/bathroom;Assistance with cooking/housework;Help with stairs or ramp for entrance;Assist for transportation    Functional Status Assessment  Patient has had a recent decline in their functional status and demonstrates  the ability to make significant improvements in function in a reasonable and predictable amount of time.  Equipment Recommendations  Other (comment) (tbd)       Precautions / Restrictions Precautions Precautions: Fall Precaution Comments: L weakness; BP <150 Restrictions Weight Bearing Restrictions: No      Mobility Bed Mobility Overal bed mobility:  (up in recliner upon therapy arrival)   Patient Response: Cooperative              ADL either performed or assessed with clinical judgement   ADL Overall ADL's : Needs assistance/impaired Eating/Feeding: Set up;Sitting Eating/Feeding Details (indicate cue type and reason): assist to open containers as needed Grooming: Set up;Sitting   Upper Body Bathing: Minimal assistance;Sitting Upper Body Bathing Details (indicate cue type and reason): Assist will be needed for the washing the right UE and side of body due to left hemi. Lower Body Bathing: Moderate assistance;Sit to/from stand   Upper Body Dressing : Minimal assistance;Sitting   Lower Body Dressing: Maximal assistance;Sit to/from stand Lower Body Dressing Details (indicate cue type and reason): Assisted needed to don left hospital sock Toilet Transfer: Minimal assistance;Ambulation   Toileting- Clothing Manipulation and Hygiene: Minimal assistance;Sitting/lateral lean               Vision Baseline Vision/History: 1 Wears glasses (for reading) Ability to See in Adequate Light: 0 Adequate Patient Visual Report: Peripheral vision impairment Vision Assessment?: Yes Eye Alignment: Within Functional Limits Ocular Range of Motion: Within Functional Limits Alignment/Gaze Preference: Within Defined Limits Tracking/Visual Pursuits: Able to track stimulus in all quads without difficulty Convergence: Within functional limits Visual Fields: Left visual  field deficit;Left homonymous hemianopsia            Pertinent Vitals/Pain Pain Assessment Pain Assessment:  Faces Faces Pain Scale: No hurt     Hand Dominance Right   Extremity/Trunk Assessment Upper Extremity Assessment RUE Deficits / Details: Full A/ROM with 5/5 strength in all ranges. RUE Coordination: WNL (9 hole peg test: 29.17") LUE Deficits / Details: History of CVA with left side involvement (hemiparesis). A/ROM shoulder flexion: ~5 degrees. elbow flexion/extension: WFL when arm is off loaded in a gravity eliminated plane. Limited wrist extension;full wrist flexion. wrist supination ~25% range. Able to make a full fist and demonstrates a weak gross grasp. Able to demonstrate composite finger extension 50-75%. Limited thumb ROM. Pt reports that he was able to hold a cup in his left hand previously and he is unable to do so now and reports decreased thumb ROM. LUE Coordination: decreased fine motor;decreased gross motor (Unable to complete 9 hole peg test even with set-up of pegs)           Communication Communication Communication: No difficulties   Cognition Arousal/Alertness: Awake/alert Behavior During Therapy: WFL for tasks assessed/performed, Flat affect Overall Cognitive Status: No family/caregiver present to determine baseline cognitive functioning           General Comments: At evaluation yesterday, pt stated that he was close to baseline, per PT, pt reports that he is having more difficulty than previous level of function.                Home Living Family/patient expects to be discharged to:: Private residence Living Arrangements: Spouse/significant other (Wife is wheelchair bound) Available Help at Discharge: Family;Available 24 hours/day Type of Home: House Home Access: Ramped entrance;Stairs to enter Entrance Stairs-Number of Steps: ramp into the house then 2 steps with grab bar   Home Layout: One level     Bathroom Shower/Tub: Occupational psychologist: Handicapped height Bathroom Accessibility: Yes How Accessible: Accessible via  wheelchair;Accessible via walker Home Equipment: [place] (2 wheels);Shower seat;Grab bars - tub/shower;Grab bars - toilet;Rollator (4 wheels)      Lives With: Spouse    Prior Functioning/Environment Prior Level of Function : Needs assist;Independent/Modified Independent     Mobility Comments: reports walked limited household distances only. Pt takes public transport to OP therapy appointments. ADLs Comments: Wife provides stand by assist for shower. Does not provide physical hands on assist.        OT Problem List: Decreased coordination;Decreased strength;Impaired balance (sitting and/or standing);Impaired UE functional use;Impaired vision/perception;Decreased range of motion;Decreased activity tolerance      OT Treatment/Interventions: Self-care/ADL training;Splinting;Therapeutic activities;Neuromuscular education;Therapeutic exercise;Energy conservation;Visual/perceptual remediation/compensation;Patient/family education;DME and/or AE instruction;Manual therapy;Balance training;Modalities    OT Goals(Current goals can be found in the care plan section) Acute Rehab OT Goals Patient Stated Goal: to go to rehab OT Goal Formulation: With patient Time For Goal Achievement: 05/18/22 Potential to Achieve Goals: Good  OT Frequency: Min 2X/week       AM-PAC OT "6 Clicks" Daily Activity     Outcome Measure Help from another person eating meals?: A Little Help from another person taking care of personal grooming?: A Little Help from another person toileting, which includes using toliet, bedpan, or urinal?: A Little Help from another person bathing (including washing, rinsing, drying)?: A Little Help from another person to put on and taking off regular upper body clothing?: A Little Help from another person to put on and taking off regular lower body clothing?: A Lot  6 Click Score: 17   End of Session    Activity Tolerance: Patient tolerated treatment well Patient left: in  chair;with call bell/phone within reach;with chair alarm set  OT Visit Diagnosis: Unsteadiness on feet (R26.81);Muscle weakness (generalized) (M62.81);Other symptoms and signs involving the nervous system (R29.898);Hemiplegia and hemiparesis Hemiplegia - Right/Left: Right Hemiplegia - dominant/non-dominant: Non-Dominant Hemiplegia - caused by: Nontraumatic intracerebral hemorrhage                Time: 1212-1238 OT Time Calculation (min): 26 min Charges:  OT General Charges $OT Visit: 1 Visit OT Evaluation $OT Eval Moderate Complexity: 1 [employer], OTR/L,CBIS  Supplemental OT - [facility] and [facility] Secure Chat Preferred    Darrill Vreeland, Clarene Duke 05/04/2022, 1:18 PM

## 2022-05-04 NOTE — Progress Notes (Signed)
  Transition of Care [facility]) Screening Note   Patient Details  Name: Joseph Ortega Date of Birth: 10-15-1955   Transition of Care [facility]) CM/SW Contact:    Benard Halsted, LCSW Phone Number: 05/04/2022, 2:26 PM    Transition of Care Department [facility]) has reviewed patient and will follow for CIR's evaluation. We will continue to monitor patient advancement through interdisciplinary progression rounds. If new patient transition needs arise, please place a TOC consult.

## 2022-05-04 NOTE — Progress Notes (Addendum)
STROKE TEAM PROGRESS NOTE   INTERVAL HISTORY Patient is seen in his room with no family at the bedside.  PT/OT now recommend discharge to CIR.  Recommend loop recorder placement for patient with possible Watchman procedure if patient is found to have A-fib.  Patient would like to think about this and will contact cardiology if he wishes to have a loop recorder placed. Vitals:   05/04/22 1100 05/04/22 1200 05/04/22 1300 05/04/22 1400  BP: 126/66 (!) 112/58 (!) 118/50 113/60  Pulse: (!) 59 (!) 59 70 60  Resp: 17 15 20 20  $ Temp:      TempSrc:      SpO2: 94% 94% 93% 93%   CBC:  Recent Labs  Lab 05/02/22 1414  WBC 5.9  NEUTROABS 4.4  HGB 14.0  HCT 41.3  MCV 94.5  PLT [id number]    Basic Metabolic Panel:  Recent Labs  Lab 05/02/22 1845  NA 137  K 3.8  CL 100  CO2 26  GLUCOSE 159*  BUN 11  CREATININE 0.80  CALCIUM 9.1    Lipid Panel: No results for input(s): "CHOL", "TRIG", "HDL", "CHOLHDL", "VLDL", "LDLCALC" in the last 168 hours. HgbA1c:  Recent Labs  Lab 05/02/22 1941  HGBA1C 8.3*   Urine Drug Screen:  Recent Labs  Lab 05/03/22 0832  LABOPIA NONE DETECTED  COCAINSCRNUR NONE DETECTED  LABBENZ NONE DETECTED  AMPHETMU NONE DETECTED  THCU NONE DETECTED  LABBARB NONE DETECTED    Alcohol Level No results for input(s): "ETH" in the last 168 hours.  IMAGING past 24 hours VAS [place] LOWER EXTREMITY VENOUS (DVT)  Result Date: 05/04/2022  Lower Venous DVT Study Patient Name:  Joseph Ortega  Date of Exam:   05/03/2022 Medical Rec #: [id number]     Accession #:    [id number] Date of Birth: 1955/07/19     Patient Gender: M Patient Age:   67 years Exam Location:  [facility] Procedure:      VAS [place] LOWER EXTREMITY VENOUS (DVT) Referring Phys: Janine Ores --------------------------------------------------------------------------------  Indications: Stroke.  Comparison Study: 03-12-2021 Bilateral lower extremity venous study was negative                   for DVT. Performing  Technologist: Darlin Coco RDMS, RVT  Examination Guidelines: A complete evaluation includes B-mode imaging, spectral Doppler, color Doppler, and power Doppler as needed of all accessible portions of each vessel. Bilateral testing is considered an integral part of a complete examination. Limited examinations for reoccurring indications may be performed as noted. The reflux portion of the exam is performed with the patient in reverse Trendelenburg.  +---------+---------------+---------+-----------+----------+--------------+ RIGHT    CompressibilityPhasicitySpontaneityPropertiesThrombus Aging +---------+---------------+---------+-----------+----------+--------------+ CFV      Full           Yes      Yes                                 +---------+---------------+---------+-----------+----------+--------------+ SFJ      Full                                                        +---------+---------------+---------+-----------+----------+--------------+ FV Prox  Full                                                        +---------+---------------+---------+-----------+----------+--------------+  FV Mid   Full                                                        +---------+---------------+---------+-----------+----------+--------------+ FV DistalFull                                                        +---------+---------------+---------+-----------+----------+--------------+ PFV      Full                                                        +---------+---------------+---------+-----------+----------+--------------+ POP      Full           Yes      Yes                                 +---------+---------------+---------+-----------+----------+--------------+ PTV      Full                                                        +---------+---------------+---------+-----------+----------+--------------+ PERO     Full                                                         +---------+---------------+---------+-----------+----------+--------------+   +---------+---------------+---------+-----------+----------+--------------+ LEFT     CompressibilityPhasicitySpontaneityPropertiesThrombus Aging +---------+---------------+---------+-----------+----------+--------------+ CFV      Full           Yes      Yes                                 +---------+---------------+---------+-----------+----------+--------------+ SFJ      Full                                                        +---------+---------------+---------+-----------+----------+--------------+ FV Prox  Full                                                        +---------+---------------+---------+-----------+----------+--------------+ FV Mid   Full                                                        +---------+---------------+---------+-----------+----------+--------------+  FV DistalFull                                                        +---------+---------------+---------+-----------+----------+--------------+ PFV      Full                                                        +---------+---------------+---------+-----------+----------+--------------+ POP      Full           Yes      Yes                                 +---------+---------------+---------+-----------+----------+--------------+ PTV      Full                                                        +---------+---------------+---------+-----------+----------+--------------+ PERO     Full                                                        +---------+---------------+---------+-----------+----------+--------------+     Summary: RIGHT: - There is no evidence of deep vein thrombosis in the lower extremity.  - No cystic structure found in the popliteal fossa.  LEFT: - There is no evidence of deep vein thrombosis in the lower extremity.  - No cystic structure found  in the popliteal fossa.  *See table(s) above for measurements and observations. Electronically signed by Deitra Mayo MD on 05/04/2022 at 10:31:15 AM.    Final     PHYSICAL EXAM  Physical Exam  Constitutional: Appears well-developed and well-nourished middle-age Caucasian male.   Cardiovascular: Normal rate and regular rhythm.  Respiratory: Effort normal, non-labored breathing  Neuro: Mental Status: Patient is awake, alert, oriented to person, place, month, year, and situation. Patient is able to give a clear and coherent history. No signs of aphasia or neglect Cranial Nerves: II: Field cut left  III,IV, VI: EOMI without ptosis or diploplia.  V: Facial sensation is symmetric to temperature VII: Facial movement is symmetric resting and smiling VIII: Hearing is intact to voice X: Palate elevates symmetrically XI: Shoulder shrug is symmetric. XII: Tongue protrudes midline without atrophy or fasciculations.  Motor: Tone is normal. Bulk is normal. RUE 5/5 LUE 4/5 RLE 5/5 LLE 4+/5 Sensory: Sensation is symmetric to light touch and temperature in the arms and legs.  Cerebellar: FNF  intact on the right, within limits of weakness on the left        ASSESSMENT/PLAN [name] is a 67 y.o. male with history of previous stroke with left-sided weakness who presents with headache that started yesterday as well as some chest pain that started overnight.  Due to his headache he had a head CT obtained which shows a fairly large hemorrhagic  infarct.  He denies any change in his left side, states that he has been continuing to work with rehab since his stroke in December 2022.  He feels that the left side has been improving.  Discussed loop recorder and potential watchman placement with patient.  He would like to think about loop recorder and will contact cardiology if he would like to have this procedure done.  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 The region of infarction measures 7.6 x 3.6 x 4.1 cm. MRI - Redemonstrated area of infarction with hemorrhage in the right temporoparietal region, which appears overall similar to the same day CT, when allowing for differences in technique. Edema causes mass effect on the right lateral ventricle and approximately 2 mm right-to-left midline shift. MRA  Suspect severe stenosis in the left P2, although evaluation is limited by motion. PT/INR 15.4/1.2 PTT 31 PLT 214 2D Echo EF 50-55% LDL 57 HgbA1c 7.8 VTE prophylaxis - SCDs    Diet   Diet heart healthy/carb modified Room service appropriate? Yes with Assist; Fluid consistency: Thin   aspirin 81 mg daily prior to admission, now on No antithrombotic.  Due to hemorrhagic transformation Therapy recommendations: CIR Disposition:  pending     Hypertension Congestive Heart Failure Home meds:  Coreg, lasix, hydrochlorothiazide, lopressor Stable Cleviprex- weaning off  PRN Hydralazine   Hyperlipidemia Home meds:  Crestor 57m, resumed in hospital LDL 57, goal < 70 Continue statin at discharge  Diabetes type II Uncontrolled Home meds:  Farxiga, insulin, trulicity H1234567.8, goal < 7.0 (Previous 11.1) CBGs Recent Labs    05/03/22 2127 05/04/22 0736 05/04/22 1111  GLUCAP 227* 183* 215*     SSI  Other Stroke Risk Factors Previous stroke  Stroke with HT - Right frontal infarct with small HT and SAH, etiology unclear, could be due to atherosclerosis given multiple uncontrolled risk factors. However, cardioembolic source can not be ruled out   Stroke - new right PCA infarct, etiology could be large vessel atherosclerosis given embolic calcified plaque at right P3 from right VA origin or subclavian artery.  Obesity, Body mass index is 37.72 kg/m., BMI >/= 30 associated with  increased stroke risk, recommend weight loss, diet and exercise as appropriate  Obstructive sleep apnea - Home BiPAP at night and with naps  Other Active Problems   Hospital day # 2  Patient seen and examined by NP/APP with MD. MD to update note as needed.   CSolvang, MSN, AGACNP-BC [facility] See Amion for schedule and pager information 05/04/2022 2:35 PM  I have personally obtained history,examined this patient, reviewed notes, independently viewed imaging studies, participated in medical decision making and plan of care.ROS completed by me personally and pertinent positives fully documented  I have made any additions or clarifications directly to the above note. Agree with note above.  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  He wants to go home.  Therapist however recommend inpatient rehab.  Blood pressure adequately controlled.  Keep systolic blood pressure below 160.  Transfer to neurology floor bed.  Greater than 50% time during this 35-minute visit was spent in counseling and coordination of care and discussion patient and care team and answering questions.  PAntony Contras MD Medical Director [facility] Pager: [phone]/16/2024 2:39 PM  To contact Stroke Continuity provider, please refer to [email or web address] After hours, contact General Neurology

## 2022-05-04 NOTE — Progress Notes (Signed)
Report given to 3W RN and patient's spouse updated. Belongings packed up including cellphone, charger, fanny pack, glasses, CPAP. Vondell Sowell, Rande Brunt, RN

## 2022-05-04 NOTE — Consult Note (Signed)
ELECTROPHYSIOLOGY CONSULT NOTE  Patient ID: Joseph Ortega MRN: [id number], DOB/AGE: 10/23/1955   Admit date: 05/02/2022 Date of Consult: 05/04/2022  Primary Physician: [facility] [place] Primary Cardiologist: None  Primary Electrophysiologist: New to EP, previously seen by [facility] but declined loop at that time Reason for Consultation: Cryptogenic stroke; recommendations regarding Implantable Loop Recorder Insurance: [employer]  History of Present Illness EP has been asked to evaluate Joseph Ortega for placement of an implantable loop recorder to monitor for atrial fibrillation by Dr Leonie Man.  The patient was admitted on 05/02/2022 with HAs given his history of strokes. He has chronic residual left sided deficit.    Imaging demonstrated cerebral infarction of the right middle cerebral artery with spontaneous hemorrhagic transformation.    He has undergone workup for stroke including:  Code Stroke CT head Acute hemorrhagic infarction affecting the right posterior parietal lobe. The region of infarction measures 7.6 x 3.6 x 4.1 cm. MRI - Redemonstrated area of infarction with hemorrhage in the right temporoparietal region, which appears overall similar to the same day CT, when allowing for differences in technique. Edema causes mass effect on the right lateral ventricle and approximately 2 mm right-to-left midline shift. MRA  Suspect severe stenosis in the left P2, although evaluation is limited by motion. PT/INR 15.4/1.2 PTT 31 PLT 214 2D Echo EF 50-55% LDL 57 HgbA1c 7.8 VTE prophylaxis - SCDs   The patient has been monitored on telemetry which has demonstrated sinus rhythm with no arrhythmias.  Inpatient stroke work-up will not require a TEE per Neurology.   Echocardiogram as above. Lab work is reviewed.  Prior to admission, the patient denies chest pain, shortness of breath, dizziness, palpitations, or syncope.  He is recovering from his stroke with plans to attend CIR  at  discharge.  Allergies, Past Medical, Surgical, Social, and Family Histories have been reviewed and are referenced here-in when relevant for medical decision making.   Inpatient Medications:   carvedilol  12.5 mg Oral BID WC   Chlorhexidine Gluconate Cloth  6 each Topical Q0600   DULoxetine  20 mg Oral BID   hydrochlorothiazide  25 mg Oral Daily   insulin aspart  0-15 Units Subcutaneous TID WC   levothyroxine  125 mcg Oral Q0600   mupirocin ointment  1 Application Nasal BID   pantoprazole  40 mg Oral QHS   rosuvastatin  40 mg Oral Daily   senna-docusate  1 tablet Oral BID    Physical Exam: Vitals:   05/04/22 0900 05/04/22 1000 05/04/22 1100 05/04/22 1200  BP: (!) 126/58 128/60 126/66 (!) 112/58  Pulse: (!) 59 60 (!) 59 (!) 59  Resp: 16 17 17 15  $ Temp:      TempSrc:      SpO2: 93% 92% 94% 94%    GEN- NAD. A&O x 3. Normal affect. HEENT: Normocephalic, atraumatic Lungs- CTAB, Normal effort.  Heart- Regular rate and rhythm rate and rhythm. No M/G/R.  Extremities- No peripheral edema. no clubbing or cyanosis Skin- warm and dry, no rash or lesion. Neuro - Chronic left sided deficit   12-lead ECG on arrival showed NSR in 60s (personally reviewed) All prior EKG's in EPIC reviewed with no documented atrial fibrillation  Prior 30 day monitor without AF  Telemetry SB/NSR 50-60s (personally reviewed)  Assessment and Plan:  1. Cryptogenic stroke The patient presents with cryptogenic stroke.  The patient does not have a TEE planned for this AM.  I spoke at length with the patient about monitoring for  afib with an implantable loop recorder.  Risks, benefits, and alteratives to implantable loop recorder were discussed with the patient today.   At this time, the patient wishes to think on a Loop recorder and call our office back to see decide on one. He has previously worn a 30 day monitor, so not sure there is great use in repeating.   He would be a borderline candidate for Watchman  procedure if AF found. Given he is procedurally adverse, not sure this is something he would be interested in.  This was not discussed during our visit today.   Shirley Friar, PA-C 05/04/2022 12:18 PM

## 2022-05-05 DIAGNOSIS — I63411 Cerebral infarction due to embolism of right middle cerebral artery: Secondary | ICD-10-CM | POA: Diagnosis not present

## 2022-05-05 LAB — CBC
HCT: 39.5 % (ref 39.0–52.0)
Hemoglobin: 13.8 g/dL (ref 13.0–17.0)
MCH: 31.9 pg (ref 26.0–34.0)
MCHC: 34.9 g/dL (ref 30.0–36.0)
MCV: 91.2 fL (ref 80.0–100.0)
Platelets: 177 10*3/uL (ref 150–400)
RBC: 4.33 MIL/uL (ref 4.22–5.81)
RDW: 14.5 % (ref 11.5–15.5)
WBC: 6.5 10*3/uL (ref 4.0–10.5)
nRBC: 0 % (ref 0.0–0.2)

## 2022-05-05 LAB — GLUCOSE, CAPILLARY
Glucose-Capillary: 169 mg/dL — ABNORMAL HIGH (ref 70–99)
Glucose-Capillary: 172 mg/dL — ABNORMAL HIGH (ref 70–99)
Glucose-Capillary: 167 mg/dL — ABNORMAL HIGH (ref 70–99)
Glucose-Capillary: 224 mg/dL — ABNORMAL HIGH (ref 70–99)

## 2022-05-05 LAB — BASIC METABOLIC PANEL
Anion gap: 9 (ref 5–15)
BUN: 22 mg/dL (ref 8–23)
CO2: 28 mmol/L (ref 22–32)
Calcium: 8.8 mg/dL — ABNORMAL LOW (ref 8.9–10.3)
Chloride: 98 mmol/L (ref 98–111)
Creatinine, Ser: 1.18 mg/dL (ref 0.61–1.24)
GFR, Estimated: >60 mL/min (ref >60)
Glucose, Bld: 180 mg/dL — ABNORMAL HIGH (ref 70–99)
Potassium: 3.2 mmol/L — ABNORMAL LOW (ref 3.5–5.1)
Sodium: 135 mmol/L (ref 135–145)

## 2022-05-05 LAB — LIPID PANEL
Cholesterol: 85 mg/dL (ref 0–200)
HDL: 40 mg/dL — ABNORMAL LOW (ref >40)
LDL Cholesterol: 33 mg/dL (ref 0–99)
Total CHOL/HDL Ratio: 2.1 RATIO
Triglycerides: 59 mg/dL (ref <150)
VLDL: 12 mg/dL (ref 0–40)

## 2022-05-05 MED ORDER — TOPIRAMATE 25 MG PO TABS
50.0000 mg | ORAL_TABLET | Freq: Every day | ORAL | Status: DC
  Administered 2022-05-05 – 2022-05-07 (×3): 50 mg via ORAL
  Filled 2022-05-05 (×3): qty 2

## 2022-05-05 MED ORDER — HEPARIN SODIUM (PORCINE) 5000 UNIT/ML IJ SOLN
5000.0000 [IU] | Freq: Three times a day (TID) | INTRAMUSCULAR | Status: DC
  Administered 2022-05-06 – 2022-05-07 (×6): 5000 [IU] via SUBCUTANEOUS
  Filled 2022-05-05 (×6): qty 1

## 2022-05-05 MED ORDER — POTASSIUM CHLORIDE CRYS ER 20 MEQ PO TBCR
40.0000 meq | EXTENDED_RELEASE_TABLET | Freq: Once | ORAL | Status: AC
  Administered 2022-05-05: 40 meq via ORAL
  Filled 2022-05-05: qty 2

## 2022-05-05 NOTE — Progress Notes (Signed)
Inpatient Rehab Admissions Coordinator:    I met with pt. To discuss potential CIR admit. He is interested, states that wife can do 24/7 physical assist at d/c . I will open a case with his insurance on Monday and pursue for admit.   Clemens Catholic, [place], [place] Admissions Coordinator  [phone] ([place]) [phone] (office)

## 2022-05-05 NOTE — Plan of Care (Signed)
  Problem: Education: Goal: Knowledge of disease or condition will improve Outcome: Progressing Goal: Knowledge of secondary prevention will improve (MUST DOCUMENT ALL) Outcome: Progressing Goal: Knowledge of patient specific risk factors will improve (Mark N/A or DELETE if not current risk factor) Outcome: Progressing   Problem: Intracerebral Hemorrhage Tissue Perfusion: Goal: Complications of Intracerebral Hemorrhage will be minimized Outcome: Progressing   Problem: Coping: Goal: Will verbalize positive feelings about self Outcome: Progressing Goal: Will identify appropriate support needs Outcome: Progressing   Problem: Health Behavior/Discharge Planning: Goal: Ability to manage health-related needs will improve Outcome: Progressing Goal: Goals will be collaboratively established with patient/family Outcome: Progressing   Problem: Self-Care: Goal: Ability to participate in self-care as condition permits will improve Outcome: Progressing Goal: Verbalization of feelings and concerns over difficulty with self-care will improve Outcome: Progressing Goal: Ability to communicate needs accurately will improve Outcome: Progressing   Problem: Nutrition: Goal: Risk of aspiration will decrease Outcome: Progressing Goal: Dietary intake will improve Outcome: Progressing   Problem: Education: Goal: Ability to describe self-care measures that may prevent or decrease complications (Diabetes Survival Skills Education) will improve Outcome: Progressing Goal: Individualized Educational Video(s) Outcome: Progressing   Problem: Coping: Goal: Ability to adjust to condition or change in health will improve Outcome: Progressing   Problem: Fluid Volume: Goal: Ability to maintain a balanced intake and output will improve Outcome: Progressing   Problem: Health Behavior/Discharge Planning: Goal: Ability to identify and utilize available resources and services will improve Outcome:  Progressing Goal: Ability to manage health-related needs will improve Outcome: Progressing   Problem: Metabolic: Goal: Ability to maintain appropriate glucose levels will improve Outcome: Progressing   Problem: Nutritional: Goal: Maintenance of adequate nutrition will improve Outcome: Progressing Goal: Progress toward achieving an optimal weight will improve Outcome: Progressing   Problem: Skin Integrity: Goal: Risk for impaired skin integrity will decrease Outcome: Progressing   Problem: Tissue Perfusion: Goal: Adequacy of tissue perfusion will improve Outcome: Progressing   Problem: Education: Goal: Knowledge of General Education information will improve Description: Including pain rating scale, medication(s)/side effects and non-pharmacologic comfort measures Outcome: Progressing   Problem: Health Behavior/Discharge Planning: Goal: Ability to manage health-related needs will improve Outcome: Progressing   Problem: Clinical Measurements: Goal: Ability to maintain clinical measurements within normal limits will improve Outcome: Progressing Goal: Will remain free from infection Outcome: Progressing Goal: Diagnostic test results will improve Outcome: Progressing Goal: Respiratory complications will improve Outcome: Progressing Goal: Cardiovascular complication will be avoided Outcome: Progressing   Problem: Activity: Goal: Risk for activity intolerance will decrease Outcome: Progressing   Problem: Nutrition: Goal: Adequate nutrition will be maintained Outcome: Progressing   Problem: Coping: Goal: Level of anxiety will decrease Outcome: Progressing   Problem: Elimination: Goal: Will not experience complications related to bowel motility Outcome: Progressing Goal: Will not experience complications related to urinary retention Outcome: Progressing   Problem: Pain Managment: Goal: General experience of comfort will improve Outcome: Progressing   Problem:  Safety: Goal: Ability to remain free from injury will improve Outcome: Progressing   Problem: Skin Integrity: Goal: Risk for impaired skin integrity will decrease Outcome: Progressing   

## 2022-05-05 NOTE — Progress Notes (Addendum)
STROKE TEAM PROGRESS NOTE   INTERVAL HISTORY Patient is seen in his room with no family at the bedside.  His vital signs have been stable, and his neurological exam remains stable.  He complains of headaches rated 6-7/10, with some improvement from Tylenol.  Will start Topamax.  Patient is still considering whether he wants to have loop recorder placed and will let us know when he has reached a decision.  He is eager to go home, but is willing to go to rehab first if this is the best thing for him.  Vitals:   05/04/22 2306 05/05/22 0310 05/05/22 0803 05/05/22 1216  BP: (!) 156/70 (!) 143/67 (!) 145/64 (!) 145/63  Pulse: (!) 57 65 62 68  Resp: 17 17 18 18  $ Temp: 97.8 F (36.6 C) 98.6 F (37 C) 98.5 F (36.9 C) 99.1 F (37.3 C)  TempSrc: Oral Oral Oral Oral  SpO2: 97% 97% 98% 92%  Weight:      Height:       CBC:  Recent Labs  Lab 05/02/22 1414 05/05/22 0444  WBC 5.9 6.5  NEUTROABS 4.4  --   HGB 14.0 13.8  HCT 41.3 39.5  MCV 94.5 91.2  PLT 214 [id number]    Basic Metabolic Panel:  Recent Labs  Lab 05/02/22 1845 05/05/22 0444  NA 137 135  K 3.8 3.2*  CL 100 98  CO2 26 28  GLUCOSE 159* 180*  BUN 11 22  CREATININE 0.80 1.18  CALCIUM 9.1 8.8*    Lipid Panel:  Recent Labs  Lab 05/05/22 0444  CHOL 85  TRIG 59  HDL 40*  CHOLHDL 2.1  VLDL 12  LDLCALC 33   HgbA1c:  Recent Labs  Lab 05/02/22 1941  HGBA1C 8.3*    Urine Drug Screen:  Recent Labs  Lab 05/03/22 0832  LABOPIA NONE DETECTED  COCAINSCRNUR NONE DETECTED  LABBENZ NONE DETECTED  AMPHETMU NONE DETECTED  THCU NONE DETECTED  LABBARB NONE DETECTED     Alcohol Level No results for input(s): "ETH" in the last 168 hours.  IMAGING past 24 hours No results found.  PHYSICAL EXAM  Physical Exam  Constitutional: Appears well-developed and well-nourished middle-age Caucasian male.   Cardiovascular: Normal rate and regular rhythm.  Respiratory: Effort normal, non-labored breathing  Neuro: Mental  Status: Patient is awake, alert, oriented to person, place, month, year, and situation. Patient is able to give a clear and coherent history. No signs of aphasia or neglect Cranial Nerves: II: Field cut left  III,IV, VI: EOMI without ptosis or diploplia.  V: Facial sensation is symmetric to temperature VII: Facial movement is symmetric resting and smiling VIII: Hearing is intact to voice X: Palate elevates symmetrically XI: Shoulder shrug is symmetric. XII: Tongue protrudes midline without atrophy or fasciculations.  Motor: Tone is normal. Bulk is normal. RUE 5/5 LUE 4/5 RLE 5/5 LLE 4/5 Sensory: Sensation is symmetric to light touch and temperature in the arms and legs.  Cerebellar: FNF  intact on the right, within limits of weakness on the left   ASSESSMENT/PLAN [name] is a 67 y.o. male with history of previous stroke with left-sided weakness who presents with headache that started yesterday as well as some chest pain that started overnight.  Due to his headache he had a head CT obtained which shows a fairly large hemorrhagic infarct.    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 The region of infarction measures 7.6 x 3.6 x 4.1 cm. MRI - Redemonstrated area of infarction with hemorrhage in the right temporoparietal region, 2 mm right-to-left midline shift. MRA  Suspect severe stenosis in the left P2 CT repeat in a.m. 2D Echo EF 50-55% Discussed loop recorder with patient.  He would like to think about it and let us know.    LDL 33 HgbA1c 8.3 UDS negative VTE prophylaxis -heparin subcu aspirin 81 mg daily prior to admission, now on No antithrombotic due to hemorrhagic transformation.  Will consider aspirin once appropriate. Therapy recommendations: CIR Disposition:  pending      History of stroke 02/2021 admitted for left-sided weakness.  CT showed right frontal ICH/SAH.  CTA head and neck unremarkable.  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  MRI showed right frontal infarct with hemorrhagic transformation.  Repeat MRI later showed new right occipital infarct with enlargement of posterior frontal infarct, stable HT and SAH.  CTA head and neck showed focal calcification within the right PCA distal P3 segment.  EF 45 to 50%, LDL 106, A1c is 11.1.  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  Discharged on aspirin 81 and Crestor. Outpatient 30-day cardiac event monitoring 05/2021 showed no A-fib.  Hypertension Congestive Heart Failure Home meds:  Coreg, lasix, hydrochlorothiazide, lopressor Stable Cleviprex- off  PRN Hydralazine  On Coreg and HCTZ  Hyperlipidemia Home meds:  Crestor 1m, resumed in hospital LDL 33, goal < 70 Continue statin at discharge  Diabetes type II Uncontrolled Home meds:  Farxiga, insulin, trulicity [id number], goal < 7.0 CBGs SSI Close PCP follow-up for better DM control  Other Stroke Risk Factors Obesity, Body mass index is 37.72 kg/m., BMI >/= 30 associated with increased stroke risk, recommend weight loss, diet and exercise as appropriate  Obstructive sleep apnea - Home BiPAP at night and with naps  Other Active Problems   Hospital day # 3  Patient seen and examined by NP/APP with MD. MD to update note as needed.   CGravette, MSN, AGACNP-BC [facility] See Amion for schedule and pager information 05/05/2022 2:10 PM  ATTENDING NOTE: I reviewed above note and agree with the assessment and plan. Pt was seen and examined.   No family at bedside.  Patient lying in bed, awake alert, orientated x 3, still has left hemianopia, left arm 3 -/5, but bicep and tricep 4/5, hand grip 3/5, left lower extremity 4/5.  Mild dysarthria, no aphasia.   Etiology for patient stroke concerning for cardioembolic source versus large vessel disease given uncontrolled risk factors.  He had a stroke in 02/2021 consider large vessel atherosclerosis, however had a 30-day cardiac event monitoring which was negative for A-fib.  Recommended loop recorder at this time, he stated that he will think about it and let uKoreaknow.  Will repeat CT in a.m., if Ht improves, will consider put on aspirin continue Crestor.  Aggressive risk factor modification.  PT/OT recommend CIR.  For detailed assessment and plan, please refer to above/below as I have made changes wherever appropriate.   JRosalin Hawking MD PhD Stroke Neurology 05/05/2022 10:44 PM    To contact Stroke Continuity provider, please refer to [email or web address] After hours, contact General Neurology

## 2022-05-05 NOTE — Discharge Summary (Shared)
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, etiology could be large vessel disease versus cardioembolic source   Active Problems: Hx of stroke HTN CHF HLD DM uncontrolled Obesity OSA   Medications to be continued on Rehab Allergies as of 05/07/2022   No Known Allergies      Medication List     STOP taking these medications    atorvastatin 40 MG tablet Commonly known as: LIPITOR   Claritin 10 MG tablet Generic drug: loratadine   dapagliflozin propanediol 10 MG Tabs tablet Commonly known as: FARXIGA   furosemide 40 MG tablet Commonly known as: LASIX   guanFACINE 2 MG Tb24 ER tablet Commonly known as: INTUNIV   insulin detemir 100 UNIT/ML FlexPen Commonly known as: LEVEMIR   metoprolol tartrate 50 MG tablet Commonly known as: LOPRESSOR   potassium chloride SA 20 MEQ tablet Commonly known as: KLOR-CON M   traZODone 50 MG tablet Commonly known as: DESYREL   Trulicity 3 0000000 Sopn Generic drug: Dulaglutide   TYLENOL PO   valsartan 320 MG tablet Commonly known as: DIOVAN   Vitamin D (Ergocalciferol) 1.25 MG (50000 UNIT) Caps capsule Commonly known as: DRISDOL       TAKE these medications    aspirin EC 81 MG tablet Take 1 tablet (81 mg total) by mouth daily. Swallow whole. Start taking on: May 08, 2022   carvedilol 12.5 MG tablet Commonly known as: COREG Take 1 tablet (12.5 mg total) by mouth 2 (two) times daily with a meal.   docusate sodium 100 MG capsule Commonly known as: COLACE Take 1 capsule (100 mg total) by mouth 2 (two) times daily as needed for mild constipation.   DULoxetine 20 MG capsule Commonly  known as: CYMBALTA Take 1 capsule (20 mg total) by mouth 2 (two) times daily. What changed:  medication strength how much to take   hydrochlorothiazide 25 MG tablet Commonly known as: HYDRODIURIL TAKE 1 TABLET (25 MG TOTAL) BY MOUTH DAILY.   levothyroxine 125 MCG tablet Commonly known as: SYNTHROID Take 1 tablet (125 mcg total) by mouth daily at 6 (six) AM. Start taking on: May 08, 2022 What changed: when to take this   NovoLOG FlexPen 100 UNIT/ML FlexPen Generic drug: insulin aspart Inject 14 Units into the skin 3 (three) times daily with meals. What changed:  how much to take when to take this additional instructions   ondansetron 4 MG disintegrating tablet Commonly known as: ZOFRAN-ODT Take 1 tablet (4 mg total) by mouth every 8 (eight) hours as needed for nausea or vomiting.   rosuvastatin 40 MG tablet Commonly known as: CRESTOR Take 40 mg by mouth daily.   senna-docusate 8.6-50 MG tablet Commonly known as: Senokot-S Take 1 tablet by mouth 2 (two) times daily.   simethicone 80 MG chewable tablet Commonly known as: MYLICON Chew 1 tablet (80 mg total) by mouth 4 (four) times daily as needed for flatulence.   topiramate 50 MG tablet Commonly known as: TOPAMAX Take 1 tablet (50 mg total) by mouth daily. Start taking on: May 08, 2022        LABORATORY STUDIES CBC    Component Value Date/Time   WBC  6.1 05/07/2022 0354   RBC 4.55 05/07/2022 0354   HGB 14.6 05/07/2022 0354   HCT 41.4 05/07/2022 0354   PLT 173 05/07/2022 0354   MCV 91.0 05/07/2022 0354   MCH 32.1 05/07/2022 0354   MCHC 35.3 05/07/2022 0354   RDW 14.6 05/07/2022 0354   LYMPHSABS 0.9 05/02/2022 1414   MONOABS 0.4 05/02/2022 1414   EOSABS 0.1 05/02/2022 1414   BASOSABS 0.0 05/02/2022 1414   CMP    Component Value Date/Time   NA 136 05/07/2022 0354   NA 143 06/15/2021 1052   K 3.1 (L) 05/07/2022 0354   CL 98 05/07/2022 0354   CO2 25 05/07/2022 0354   GLUCOSE 202 (H)  05/07/2022 0354   BUN 18 05/07/2022 0354   BUN 21 06/15/2021 1052   CREATININE 1.14 05/07/2022 0354   CALCIUM 9.2 05/07/2022 0354   PROT 7.1 05/02/2022 1845   PROT 7.3 06/15/2021 1052   ALBUMIN 3.5 05/02/2022 1845   ALBUMIN 4.2 06/15/2021 1052   AST 27 05/02/2022 1845   ALT 15 05/02/2022 1845   ALKPHOS 98 05/02/2022 1845   BILITOT 1.3 (H) 05/02/2022 1845   BILITOT 0.4 06/15/2021 1052   GFRNONAA >60 05/07/2022 0354   GFRAA >60 10/14/2019 0147   COAGS Lab Results  Component Value Date   INR 1.2 05/03/2022   INR 1.1 03/08/2021   Lipid Panel    Component Value Date/Time   CHOL 85 05/05/2022 0444   CHOL 128 06/15/2021 1052   TRIG 59 05/05/2022 0444   HDL 40 (L) 05/05/2022 0444   HDL 49 06/15/2021 1052   CHOLHDL 2.1 05/05/2022 0444   VLDL 12 05/05/2022 0444   LDLCALC 33 05/05/2022 0444   LDLCALC 57 06/15/2021 1052   HgbA1C  Lab Results  Component Value Date   HGBA1C 8.3 (H) 05/02/2022   Urinalysis    Component Value Date/Time   COLORURINE STRAW (A) 05/02/2022 1427   APPEARANCEUR CLEAR 05/02/2022 1427   LABSPEC 1.012 05/02/2022 1427   PHURINE 7.0 05/02/2022 1427   GLUCOSEU >=500 (A) 05/02/2022 1427   HGBUR NEGATIVE 05/02/2022 1427   BILIRUBINUR NEGATIVE 05/02/2022 1427   KETONESUR 5 (A) 05/02/2022 1427   PROTEINUR 30 (A) 05/02/2022 1427   NITRITE NEGATIVE 05/02/2022 1427   LEUKOCYTESUR NEGATIVE 05/02/2022 1427   Urine Drug Screen     Component Value Date/Time   LABOPIA NONE DETECTED 05/03/2022 0832   COCAINSCRNUR NONE DETECTED 05/03/2022 0832   COCAINSCRNUR NONE DETECTED 03/08/2021 1003   LABBENZ NONE DETECTED 05/03/2022 0832   AMPHETMU NONE DETECTED 05/03/2022 0832   THCU NONE DETECTED 05/03/2022 0832   LABBARB NONE DETECTED 05/03/2022 0832    Alcohol Level No results found for: "ETH"   SIGNIFICANT DIAGNOSTIC STUDIES CT HEAD WO CONTRAST (5MM)  Result Date: 05/06/2022 CLINICAL DATA:  Stroke, follow-up EXAM: CT HEAD WITHOUT CONTRAST TECHNIQUE:  Contiguous axial images were obtained from the base of the skull through the vertex without intravenous contrast. RADIATION DOSE REDUCTION: This exam was performed according to the departmental dose-optimization program which includes automated exposure control, adjustment of the mA and/or kV according to patient size and/or use of iterative reconstruction technique. COMPARISON:  05/02/2022 CT head FINDINGS: Brain: Redemonstrated infarction with petechial hemorrhage involving the right posterior parietal lobe, with the area of infarction measuring approximately 8.2 x 3.6 x 2.0 cm (AP x TR x CC) (series 4, image 18 and series 5, image 45), previously 8.1 x 3.6 x 3.9 cm when remeasured similarly, overall unchanged. No frank  hematoma is seen. Overall unchanged degree of surrounding edema, without significant midline shift. Redemonstrated remote infarcts in the posterior right frontal lobe and right occipital lobe. No hydrocephalus or extra-axial collection. No evidence of additional acute infarct. Vascular: No hyperdense vessel. Atherosclerotic calcifications in the intracranial carotid and vertebral arteries. Skull: No acute fracture or focal lesion. Sinuses/Orbits: Clear paranasal sinuses. Status post bilateral lens replacements. Other: The mastoids are well aerated. IMPRESSION: 1. Overall unchanged size of a right parietal infarct with petechial hemorrhage. No frank hematoma is seen. Overall unchanged degree of surrounding edema, without significant midline shift. 2. Redemonstrated remote infarcts in the posterior right frontal lobe and right occipital lobe. Electronically Signed   By: Merilyn Baba M.D.   On: 05/06/2022 19:01   VAS [place] LOWER EXTREMITY VENOUS (DVT)  Result Date: 05/04/2022  Lower Venous DVT Study Patient Name:  VISHAAN TURENNE  Date of Exam:   05/03/2022 Medical Rec #: [id number]     Accession #:    [id number] Date of Birth: 1956/01/15     Patient Gender: M Patient Age:   50 years Exam Location:  [facility] Procedure:      VAS [place] LOWER EXTREMITY VENOUS (DVT) Referring Phys: Janine Ores --------------------------------------------------------------------------------  Indications: Stroke.  Comparison Study: 03-12-2021 Bilateral lower extremity venous study was negative                   for DVT. Performing Technologist: Darlin Coco RDMS, RVT  Examination Guidelines: A complete evaluation includes B-mode imaging, spectral Doppler, color Doppler, and power Doppler as needed of all accessible portions of each vessel. Bilateral testing is considered an integral part of a complete examination. Limited examinations for reoccurring indications may be performed as noted. The reflux portion of the exam is performed with the patient in reverse Trendelenburg.  +---------+---------------+---------+-----------+----------+--------------+ RIGHT    CompressibilityPhasicitySpontaneityPropertiesThrombus Aging +---------+---------------+---------+-----------+----------+--------------+ CFV      Full           Yes      Yes                                 +---------+---------------+---------+-----------+----------+--------------+ SFJ      Full                                                        +---------+---------------+---------+-----------+----------+--------------+ FV Prox  Full                                                        +---------+---------------+---------+-----------+----------+--------------+ FV Mid   Full                                                        +---------+---------------+---------+-----------+----------+--------------+ FV DistalFull                                                        +---------+---------------+---------+-----------+----------+--------------+  PFV      Full                                                        +---------+---------------+---------+-----------+----------+--------------+ POP      Full           Yes       Yes                                 +---------+---------------+---------+-----------+----------+--------------+ PTV      Full                                                        +---------+---------------+---------+-----------+----------+--------------+ PERO     Full                                                        +---------+---------------+---------+-----------+----------+--------------+   +---------+---------------+---------+-----------+----------+--------------+ LEFT     CompressibilityPhasicitySpontaneityPropertiesThrombus Aging +---------+---------------+---------+-----------+----------+--------------+ CFV      Full           Yes      Yes                                 +---------+---------------+---------+-----------+----------+--------------+ SFJ      Full                                                        +---------+---------------+---------+-----------+----------+--------------+ FV Prox  Full                                                        +---------+---------------+---------+-----------+----------+--------------+ FV Mid   Full                                                        +---------+---------------+---------+-----------+----------+--------------+ FV DistalFull                                                        +---------+---------------+---------+-----------+----------+--------------+ PFV      Full                                                        +---------+---------------+---------+-----------+----------+--------------+  POP      Full           Yes      Yes                                 +---------+---------------+---------+-----------+----------+--------------+ PTV      Full                                                        +---------+---------------+---------+-----------+----------+--------------+ PERO     Full                                                         +---------+---------------+---------+-----------+----------+--------------+     Summary: RIGHT: - There is no evidence of deep vein thrombosis in the lower extremity.  - No cystic structure found in the popliteal fossa.  LEFT: - There is no evidence of deep vein thrombosis in the lower extremity.  - No cystic structure found in the popliteal fossa.  *See table(s) above for measurements and observations. Electronically signed by Deitra Mayo MD on 05/04/2022 at 10:31:15 AM.    Final    ECHOCARDIOGRAM COMPLETE  Result Date: 05/03/2022    ECHOCARDIOGRAM REPORT   Patient Name:   KENROY VICENCIO Date of Exam: 05/03/2022 Medical Rec #:  [id number]    Height:       73.0 in Accession #:    [id number]   Weight:       261.8 lb Date of Birth:  [birth year]    BSA:          2.413 m Patient Age:    64 years     BP:           143/69 mmHg Patient Gender: M            HR:           60 bpm. Exam Location:  Inpatient Procedure: 2D Echo, 3D Echo, Cardiac Doppler, Color Doppler and Intracardiac            Opacification Agent Indications:    Stroke I63.9  History:        Patient has prior history of Echocardiogram examinations, most                 recent 03/11/2021. Stroke; Risk Factors:Hypertension, Diabetes                 and Former Smoker.  Sonographer:    Greer Pickerel Referring Phys: [phone] MCNEILL P KIRKPATRICK  Sonographer Comments: Suboptimal subcostal window. Image acquisition challenging due to respiratory motion and Image acquisition challenging due to patient body habitus. IMPRESSIONS  1. Hypokinesis / akinesis of the distal septal, distal inferior and apical walls Overall unchanged from echo done in Dec 2022. Left ventricular ejection fraction, by estimation, is 50 to 55%. The left ventricle has low normal function. There is mild left ventricular hypertrophy. Left ventricular diastolic parameters are consistent with Grade I diastolic dysfunction (impaired relaxation).  2. Right ventricular systolic function is normal. The  right ventricular size is normal.  3. Left atrial size was moderately dilated.  4. A small pericardial effusion is present.  5. The mitral valve is normal in structure. Mild mitral valve regurgitation.  6. The aortic valve is tricuspid. Aortic valve regurgitation is trivial. FINDINGS  Left Ventricle: Hypokinesis / akinesis of the distal septal, distal inferior and apical walls Overall unchanged from echo done in Dec 2022. Left ventricular ejection fraction, by estimation, is 50 to 55%. The left ventricle has low normal function. Definity contrast agent was given IV to delineate the left ventricular endocardial borders. The left ventricular internal cavity size was normal in size. There is mild left ventricular hypertrophy. Left ventricular diastolic parameters are consistent with Grade I diastolic dysfunction (impaired relaxation). Right Ventricle: The right ventricular size is normal. Right vetricular wall thickness was not assessed. Right ventricular systolic function is normal. Left Atrium: Left atrial size was moderately dilated. Right Atrium: Right atrial size was normal in size. Pericardium: A small pericardial effusion is present. Mitral Valve: The mitral valve is normal in structure. Mild mitral valve regurgitation. Tricuspid Valve: The tricuspid valve is normal in structure. Tricuspid valve regurgitation is trivial. Aortic Valve: The aortic valve is tricuspid. Aortic valve regurgitation is trivial. Pulmonic Valve: The pulmonic valve was normal in structure. Pulmonic valve regurgitation is not visualized. Aorta: The aortic root and ascending aorta are structurally normal, with no evidence of dilitation. IAS/Shunts: No atrial level shunt detected by color flow Doppler.  LEFT VENTRICLE PLAX 2D LVIDd:         5.20 cm   Diastology LVIDs:         2.80 cm   LV e' medial:    4.12 cm/s LV PW:         1.30 cm   LV E/e' medial:  18.1 LV IVS:        1.30 cm   LV e' lateral:   8.06 cm/s LVOT diam:     1.95 cm   LV E/e'  lateral: 9.2 LV SV:         87 LV SV Index:   36 LVOT Area:     2.99 cm                           3D Volume EF:                          3D EF:        53 %                          LV EDV:       273 ml                          LV ESV:       129 ml                          LV SV:        143 ml RIGHT VENTRICLE RV S prime:     11.30 cm/s TAPSE (M-mode): 2.1 cm LEFT ATRIUM              Index        RIGHT ATRIUM           Index LA diam:        5.00 cm  2.07 cm/m   RA Area:  22.50 cm LA Vol (A2C):   120.0 ml 49.74 ml/m  RA Volume:   62.40 ml  25.87 ml/m LA Vol (A4C):   133.0 ml 55.13 ml/m LA Biplane Vol: 131.0 ml 54.30 ml/m  AORTIC VALVE LVOT Vmax:   119.00 cm/s LVOT Vmean:  74.400 cm/s LVOT VTI:    0.290 m  AORTA Ao Root diam: 3.40 cm Ao Asc diam:  3.40 cm MITRAL VALVE MV Area (PHT): 3.08 cm     SHUNTS MV Decel Time: 246 msec     Systemic VTI:  0.29 m MV E velocity: 74.50 cm/s   Systemic Diam: 1.95 cm MV A velocity: 108.00 cm/s MV E/A ratio:  0.69 Dorris Carnes MD Electronically signed by Dorris Carnes MD Signature Date/Time: 05/03/2022/12:22:38 PM    Final    MR BRAIN WO CONTRAST  Result Date: 05/02/2022 CLINICAL DATA:  Stroke suspected EXAM: MRI HEAD WITHOUT CONTRAST MRA HEAD WITHOUT CONTRAST MRA NECK WITHOUT CONTRAST TECHNIQUE: Multiplanar, multi-echo pulse sequences of the brain and surrounding structures were acquired without intravenous contrast. Angiographic images of the Circle of Willis were acquired using MRA technique without intravenous contrast. Angiographic images of the neck were acquired using MRA technique without intravenous contrast. Carotid stenosis measurements (when applicable) are obtained utilizing NASCET criteria, using the distal internal carotid diameter as the denominator. COMPARISON:  07/11/2021 MRI head, no prior MRA, correlation is made with CT head 05/02/2022, CTA head 03/11/2021, and CTA neck 03/14/2021 FINDINGS: Evaluation is limited by motion artifact. In addition, the MRI head  could not be completed. Axial T1 and susceptibility weighted sequences could not be obtained. MRI HEAD FINDINGS Brain: Redemonstrated area of infarction with hemorrhage in the right temporoparietal region, which appears overall similar to the same day CT, when allowing for differences in technique. Surrounding T2 hyperintense signal, likely a combination of cytotoxic and vasogenic edema. Mass effect on the right lateral ventricle, particularly the atrium and occipital horn, without evidence of entrapment of the temporal horn. 2 mm right-to-left midline shift. Vascular: Unable to evaluate the arterial flow voids due to motion. Skull and upper cervical spine: Grossly normal marrow signal. Sinuses/Orbits: Unable to evaluate. Other: None. MRA HEAD FINDINGS Anterior circulation: Both internal carotid arteries are patent to the termini, without significant stenosis. A1 segments patent A 1 and M 1 segments. Evaluation of the distal vessels is limited by motion, but they appear patent. Patent. Normal anterior communicating artery. Anterior cerebral arteries are patent to their distal aspects. No M1 stenosis or occlusion. Normal MCA bifurcations. Distal MCA branches perfused and symmetric. Posterior circulation: Vertebral arteries patent to the vertebrobasilar junction without stenosis. Basilar patent to its distal aspect. Superior cerebellar arteries patent bilaterally. Patent P1 segments. Suspect severe stenosis in the left P2 (series 2, image 91), although evaluation is limited by motion. No evidence aneurysm or vascular malformation. Anatomic variants: None significant MRA NECK FINDINGS No evidence of hemodynamically significant stenosis, dissection, or occlusion of the imaged portions the carotid and vertebral arteries. IMPRESSION: 1. Evaluation is limited by motion artifact. In addition, the MRI head could not be completed in its entirety. 2. Redemonstrated area of infarction with hemorrhage in the right  temporoparietal region, which appears overall similar to the same day CT, when allowing for differences in technique. Edema causes mass effect on the right lateral ventricle and approximately 2 mm right-to-left midline shift. 3. No intracranial large vessel occlusion. Suspect severe stenosis in the left P2, although evaluation is limited by motion. 4. No hemodynamically significant stenosis in the  neck. Electronically Signed   By: Merilyn Baba M.D.   On: 05/02/2022 22:39   MR ANGIO HEAD WO CONTRAST  Result Date: 05/02/2022 CLINICAL DATA:  Stroke suspected EXAM: MRI HEAD WITHOUT CONTRAST MRA HEAD WITHOUT CONTRAST MRA NECK WITHOUT CONTRAST TECHNIQUE: Multiplanar, multi-echo pulse sequences of the brain and surrounding structures were acquired without intravenous contrast. Angiographic images of the Circle of Willis were acquired using MRA technique without intravenous contrast. Angiographic images of the neck were acquired using MRA technique without intravenous contrast. Carotid stenosis measurements (when applicable) are obtained utilizing NASCET criteria, using the distal internal carotid diameter as the denominator. COMPARISON:  07/11/2021 MRI head, no prior MRA, correlation is made with CT head 05/02/2022, CTA head 03/11/2021, and CTA neck 03/14/2021 FINDINGS: Evaluation is limited by motion artifact. In addition, the MRI head could not be completed. Axial T1 and susceptibility weighted sequences could not be obtained. MRI HEAD FINDINGS Brain: Redemonstrated area of infarction with hemorrhage in the right temporoparietal region, which appears overall similar to the same day CT, when allowing for differences in technique. Surrounding T2 hyperintense signal, likely a combination of cytotoxic and vasogenic edema. Mass effect on the right lateral ventricle, particularly the atrium and occipital horn, without evidence of entrapment of the temporal horn. 2 mm right-to-left midline shift. Vascular: Unable to  evaluate the arterial flow voids due to motion. Skull and upper cervical spine: Grossly normal marrow signal. Sinuses/Orbits: Unable to evaluate. Other: None. MRA HEAD FINDINGS Anterior circulation: Both internal carotid arteries are patent to the termini, without significant stenosis. A1 segments patent A 1 and M 1 segments. Evaluation of the distal vessels is limited by motion, but they appear patent. Patent. Normal anterior communicating artery. Anterior cerebral arteries are patent to their distal aspects. No M1 stenosis or occlusion. Normal MCA bifurcations. Distal MCA branches perfused and symmetric. Posterior circulation: Vertebral arteries patent to the vertebrobasilar junction without stenosis. Basilar patent to its distal aspect. Superior cerebellar arteries patent bilaterally. Patent P1 segments. Suspect severe stenosis in the left P2 (series 2, image 91), although evaluation is limited by motion. No evidence aneurysm or vascular malformation. Anatomic variants: None significant MRA NECK FINDINGS No evidence of hemodynamically significant stenosis, dissection, or occlusion of the imaged portions the carotid and vertebral arteries. IMPRESSION: 1. Evaluation is limited by motion artifact. In addition, the MRI head could not be completed in its entirety. 2. Redemonstrated area of infarction with hemorrhage in the right temporoparietal region, which appears overall similar to the same day CT, when allowing for differences in technique. Edema causes mass effect on the right lateral ventricle and approximately 2 mm right-to-left midline shift. 3. No intracranial large vessel occlusion. Suspect severe stenosis in the left P2, although evaluation is limited by motion. 4. No hemodynamically significant stenosis in the neck. Electronically Signed   By: Merilyn Baba M.D.   On: 05/02/2022 22:39   MR ANGIO NECK WO CONTRAST  Result Date: 05/02/2022 CLINICAL DATA:  Stroke suspected EXAM: MRI HEAD WITHOUT CONTRAST  MRA HEAD WITHOUT CONTRAST MRA NECK WITHOUT CONTRAST TECHNIQUE: Multiplanar, multi-echo pulse sequences of the brain and surrounding structures were acquired without intravenous contrast. Angiographic images of the Circle of Willis were acquired using MRA technique without intravenous contrast. Angiographic images of the neck were acquired using MRA technique without intravenous contrast. Carotid stenosis measurements (when applicable) are obtained utilizing NASCET criteria, using the distal internal carotid diameter as the denominator. COMPARISON:  07/11/2021 MRI head, no prior MRA, correlation is made with CT  head 05/02/2022, CTA head 03/11/2021, and CTA neck 03/14/2021 FINDINGS: Evaluation is limited by motion artifact. In addition, the MRI head could not be completed. Axial T1 and susceptibility weighted sequences could not be obtained. MRI HEAD FINDINGS Brain: Redemonstrated area of infarction with hemorrhage in the right temporoparietal region, which appears overall similar to the same day CT, when allowing for differences in technique. Surrounding T2 hyperintense signal, likely a combination of cytotoxic and vasogenic edema. Mass effect on the right lateral ventricle, particularly the atrium and occipital horn, without evidence of entrapment of the temporal horn. 2 mm right-to-left midline shift. Vascular: Unable to evaluate the arterial flow voids due to motion. Skull and upper cervical spine: Grossly normal marrow signal. Sinuses/Orbits: Unable to evaluate. Other: None. MRA HEAD FINDINGS Anterior circulation: Both internal carotid arteries are patent to the termini, without significant stenosis. A1 segments patent A 1 and M 1 segments. Evaluation of the distal vessels is limited by motion, but they appear patent. Patent. Normal anterior communicating artery. Anterior cerebral arteries are patent to their distal aspects. No M1 stenosis or occlusion. Normal MCA bifurcations. Distal MCA branches perfused and  symmetric. Posterior circulation: Vertebral arteries patent to the vertebrobasilar junction without stenosis. Basilar patent to its distal aspect. Superior cerebellar arteries patent bilaterally. Patent P1 segments. Suspect severe stenosis in the left P2 (series 2, image 91), although evaluation is limited by motion. No evidence aneurysm or vascular malformation. Anatomic variants: None significant MRA NECK FINDINGS No evidence of hemodynamically significant stenosis, dissection, or occlusion of the imaged portions the carotid and vertebral arteries. IMPRESSION: 1. Evaluation is limited by motion artifact. In addition, the MRI head could not be completed in its entirety. 2. Redemonstrated area of infarction with hemorrhage in the right temporoparietal region, which appears overall similar to the same day CT, when allowing for differences in technique. Edema causes mass effect on the right lateral ventricle and approximately 2 mm right-to-left midline shift. 3. No intracranial large vessel occlusion. Suspect severe stenosis in the left P2, although evaluation is limited by motion. 4. No hemodynamically significant stenosis in the neck. Electronically Signed   By: Merilyn Baba M.D.   On: 05/02/2022 22:39   CT Head Wo Contrast  Result Date: 05/02/2022 CLINICAL DATA:  Headache.  Worsening frequency or severity. EXAM: CT HEAD WITHOUT CONTRAST TECHNIQUE: Contiguous axial images were obtained from the base of the skull through the vertex without intravenous contrast. RADIATION DOSE REDUCTION: This exam was performed according to the departmental dose-optimization program which includes automated exposure control, adjustment of the mA and/or kV according to patient size and/or use of iterative reconstruction technique. COMPARISON:  MRI 07/11/2021.  CT 03/11/2021. FINDINGS: Brain: Acute hemorrhagic infarction affecting the right posterior parietal lobe. The region of infarction measures a proximally 7.6 x 3.6 x 4.1 cm.  There is petechial bleeding through the region of infarction, without a frank focal hematoma. Old infarctions at the right frontoparietal vertex and right occipital lobe are present as seen previously. No visible subarachnoid blood today. No midline shift. No hydrocephalus. Vascular: There is atherosclerotic calcification of the major vessels at the base of the brain. Skull: Negative Sinuses/Orbits: Clear/normal Other: None IMPRESSION: 1. Acute hemorrhagic infarction affecting the right posterior parietal lobe. The region of infarction measures 7.6 x 3.6 x 4.1 cm. There is petechial bleeding through the region of infarction, without a frank focal hematoma. No midline shift. 2. Old infarctions at the right frontoparietal vertex and right occipital lobe as seen previously. 3. Critical Value/emergent  results were called by telephone at the time of interpretation on 05/02/2022 at 2:55 pm to provider EDP , who verbally acknowledged these results. Electronically Signed   By: Nelson Chimes M.D.   On: 05/02/2022 15:03   DG Chest Port 1 View  Result Date: 05/02/2022 CLINICAL DATA:  Chest pain EXAM: PORTABLE CHEST 1 VIEW COMPARISON:  Chest x-ray dated July 27th 2021 FINDINGS: The heart size and mediastinal contours are within normal limits. Bibasilar atelectasis. Both lungs are clear. The visualized skeletal structures are unremarkable. IMPRESSION: No active disease. Electronically Signed   By: Yetta Glassman M.D.   On: 05/02/2022 14:00       HISTORY OF PRESENT ILLNESS Joseph Ortega is a 67 y.o. male with a history of previous stroke with left-sided weakness who presents with headache that started yesterday as well as some chest pain that started overnight.  Due to his headache he had a head CT obtained which shows a fairly large hemorrhagic infarct.  He denies any change in his left side, states that he has been continuing to work with rehab since his stroke in December 2022.  He feels that the left side has been  improving.   He does have some chest pain, which is reproducible on palpation.  HOSPITAL COURSE [name] is a 67 y.o. male with history of previous stroke with left-sided weakness who presents with headache that started yesterday as well as some chest pain that started overnight.  Due to his headache he had a head CT obtained which shows a fairly large hemorrhagic infarct.     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 The region of infarction measures 7.6 x 3.6 x 4.1 cm. MRI - Redemonstrated area of infarction with hemorrhage in the right temporoparietal region, which appears overall similar to the same day CT, when allowing for differences in technique. Edema causes mass effect on the right lateral ventricle and approximately 2 mm right-to-left midline shift. MRA  Suspect severe stenosis in the left P2, although evaluation is limited by motion. CT repeat 2/18 evolving HT, no new finding 2D Echo EF 50-55% Loop recorder placed prior to discharge to CIR on 2/19 LDL 33 HgbA1c 8.3 VTE prophylaxis - SCDs aspirin 81 mg daily prior to admission, now on ASA 75m. No DAPT given hemorrhagic conversion Start Topamax 50 mg daily for headaches Therapy recommendations: CIR Disposition:  pending      History of stroke 02/2021 admitted for left-sided weakness.  CT showed right frontal ICH/SAH.  CTA head and neck unremarkable.  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  MRI showed right frontal infarct with hemorrhagic transformation.  Repeat MRI later showed new right occipital infarct with enlargement of posterior frontal infarct, stable HT and SAH.  CTA head and neck showed focal calcification within the right PCA distal P3 segment.  EF 45 to 50%, LDL 106,  A1c is 11.1.  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  Discharged on aspirin 81 and Crestor. Outpatient 30-day cardiac event monitoring 05/2021 showed no A-fib.  Hypertension Congestive Heart Failure Home meds:  Coreg, lasix, hydrochlorothiazide, lopressor Stable Cleviprex- off  PRN Hydralazine  On Coreg and HCTZ    Hyperlipidemia Home meds:  Crestor 469m resumed in hospital LDL 33, goal < 70 Continue statin at discharge   Diabetes type II Uncontrolled  Home meds:  Farxiga, insulin, trulicity 123456 8.3, goal < 7.0  CBGs SSI Close PCP follow-up for better DM control   Other Stroke Risk Factors Obesity, Body mass index is 37.72 kg/m., BMI >/= 30 associated with increased stroke risk, recommend weight loss, diet and exercise as appropriate  Obstructive sleep apnea - Home BiPAP at night and with naps  DISCHARGE EXAM Blood pressure 126/62, pulse 65, temperature (!) 97.4 F (36.3 C), temperature source Oral, resp. rate 16, height 6' 1"$  (1.854 m), weight 112 kg, SpO2 94 %.  Physical Exam  Constitutional: Appears well-developed and well-nourished middle-age Caucasian male.   Cardiovascular: Normal rate and regular rhythm.  Respiratory: Effort normal, non-labored breathing   Neuro: Mental Status: Patient is awake, alert, oriented to person, place, month, year, and situation. Patient is able to give a clear and coherent history. No signs of aphasia or neglect Cranial Nerves: II: Field cut left  III,IV, VI: EOMI without ptosis or diploplia.  V: Facial sensation is symmetric to temperature VII: Facial movement is symmetric resting and smiling VIII: Hearing is intact to voice X: Palate elevates symmetrically XI: Shoulder shrug is symmetric. XII: Tongue protrudes midline without atrophy or fasciculations.  Motor: Tone is normal. Bulk is normal. RUE 5/5          LUE 4/5 RLE 5/5           LLE 4+/5 Sensory: Sensation is symmetric to light touch and temperature in the  arms and legs.  Cerebellar: FNF  intact on the right, within limits of weakness on the left   Discharge Diet      Diet   Diet heart healthy/carb modified Room service appropriate? Yes with Assist; Fluid consistency: Thin   liquids  DISCHARGE PLAN Disposition:  Transfer to [facility] for ongoing PT, OT and ST No antithrombotic for secondary stroke prevention given hemorrhagic transformation. Recommend ongoing stroke risk factor control by Primary Care Physician at time of discharge from inpatient rehabilitation. Follow-up PCP Center, [facility] in 2 weeks following discharge from rehab. Follow-up in [facility] in 4 weeks following discharge from rehab, office to schedule an appointment.   45 minutes were spent preparing discharge.  Beulah Gandy DNP, ACNPC-AG  [facility]  ATTENDING NOTE: I reviewed above note and agree with the assessment and plan. Pt was seen and examined.   No family at the bedside, no acute event overnight. He is in agreement with loop recorder, which was placed before discharge to CIR. On ASA and statin. Follow up at [place] in 4 weeks.   For detailed assessment and plan, please refer to above/below as I have made changes wherever appropriate.   Rosalin Hawking, MD PhD Stroke Neurology 05/07/2022 6:56 PM

## 2022-05-06 ENCOUNTER — Inpatient Hospital Stay (HOSPITAL_COMMUNITY): Payer: BC Managed Care – PPO

## 2022-05-06 DIAGNOSIS — Z8673 Personal history of transient ischemic attack (TIA), and cerebral infarction without residual deficits: Secondary | ICD-10-CM

## 2022-05-06 DIAGNOSIS — I63411 Cerebral infarction due to embolism of right middle cerebral artery: Secondary | ICD-10-CM | POA: Diagnosis not present

## 2022-05-06 DIAGNOSIS — E11649 Type 2 diabetes mellitus with hypoglycemia without coma: Secondary | ICD-10-CM

## 2022-05-06 LAB — BASIC METABOLIC PANEL
Anion gap: 9 (ref 5–15)
BUN: 16 mg/dL (ref 8–23)
CO2: 29 mmol/L (ref 22–32)
Calcium: 9 mg/dL (ref 8.9–10.3)
Chloride: 96 mmol/L — ABNORMAL LOW (ref 98–111)
Creatinine, Ser: 1.14 mg/dL (ref 0.61–1.24)
GFR, Estimated: >60 mL/min (ref >60)
Glucose, Bld: 183 mg/dL — ABNORMAL HIGH (ref 70–99)
Potassium: 3.5 mmol/L (ref 3.5–5.1)
Sodium: 134 mmol/L — ABNORMAL LOW (ref 135–145)

## 2022-05-06 LAB — GLUCOSE, CAPILLARY
Glucose-Capillary: 194 mg/dL — ABNORMAL HIGH (ref 70–99)
Glucose-Capillary: 271 mg/dL — ABNORMAL HIGH (ref 70–99)
Glucose-Capillary: 187 mg/dL — ABNORMAL HIGH (ref 70–99)
Glucose-Capillary: 207 mg/dL — ABNORMAL HIGH (ref 70–99)

## 2022-05-06 LAB — CBC
HCT: 40.9 % (ref 39.0–52.0)
Hemoglobin: 13.9 g/dL (ref 13.0–17.0)
MCH: 31.4 pg (ref 26.0–34.0)
MCHC: 34 g/dL (ref 30.0–36.0)
MCV: 92.5 fL (ref 80.0–100.0)
Platelets: 186 10*3/uL (ref 150–400)
RBC: 4.42 MIL/uL (ref 4.22–5.81)
RDW: 14.5 % (ref 11.5–15.5)
WBC: 6.2 10*3/uL (ref 4.0–10.5)
nRBC: 0 % (ref 0.0–0.2)

## 2022-05-06 MED ORDER — ONDANSETRON 4 MG PO TBDP: 4.0000 mg | ORAL_TABLET | Freq: Three times a day (TID) | ORAL | Status: DC | PRN

## 2022-05-06 MED ORDER — SIMETHICONE 80 MG PO CHEW
80.0000 mg | CHEWABLE_TABLET | Freq: Four times a day (QID) | ORAL | Status: DC | PRN
  Administered 2022-05-06 (×2): 80 mg via ORAL
  Filled 2022-05-06 (×2): qty 1

## 2022-05-06 MED ORDER — ASPIRIN 81 MG PO TBEC
81.0000 mg | DELAYED_RELEASE_TABLET | Freq: Every day | ORAL | Status: DC
  Administered 2022-05-07: 81 mg via ORAL
  Filled 2022-05-06: qty 1

## 2022-05-06 MED ORDER — LOPERAMIDE HCL 2 MG PO CAPS
4.0000 mg | ORAL_CAPSULE | Freq: Once | ORAL | Status: AC
  Administered 2022-05-06: 4 mg via ORAL
  Filled 2022-05-06: qty 2

## 2022-05-06 NOTE — Progress Notes (Addendum)
Physical Therapy Treatment Patient Details Name: Raja Kepner MRN: [id number] DOB: 12/05/1955 Today's Date: 05/06/2022   History of Present Illness Patient is a 67 y/o male with PMH DM, HTN, Thyroid disease and prior CVA 12/22 with residual L side weakness.  Admitted due to HA, CP, and found to have hemorrhage in the  right temporoparietal region with 74m L to R midline shift.    PT Comments    Pt is continuing to demonstrate deficits in strength in his L peroneals and anterior tibialis, resulting in poor foot clearance and heel strike when ambulating. Pt benefits from multi-modal cues to increase his L step length and facilitate foot clearance. Pt is also demonstrating deficits in L quadricepts concentric and eccentric strength, resulting in him requiring modA to power up to stand and assistance to control his descent to sit with noted poor control by pt. Focused session on gait training and L lower extremity strengthening to address these deficits. Will continue to follow acutely. Current recommendations remain appropriate.      Recommendations for follow up therapy are one component of a multi-disciplinary discharge planning process, led by the attending physician.  Recommendations may be updated based on patient status, additional functional criteria and insurance authorization.  Follow Up Recommendations  Acute inpatient rehab (3hours/day)     Assistance Recommended at Discharge Intermittent Supervision/Assistance  Patient can return home with the following A little help with walking and/or transfers;Assistance with cooking/housework;A little help with bathing/dressing/bathroom;Help with stairs or ramp for entrance   Equipment Recommendations  None recommended by PT    Recommendations for Other Services       Precautions / Restrictions Precautions Precautions: Fall Precaution Comments: L weakness; BP <150 Restrictions Weight Bearing Restrictions: No     Mobility  Bed  Mobility Overal bed mobility: Needs Assistance Bed Mobility: Sit to Supine       Sit to supine: Min assist, HOB elevated   General bed mobility comments: MinA to lift L leg up onto bed to transition to supine    Transfers Overall transfer level: Needs assistance Equipment used: Rolling walker (2 wheels) Transfers: Sit to/from Stand Sit to Stand: Mod assist, From elevated surface           General transfer comment: Pt needing modA to come to stand 5x from EOB, with varying levels of elevation. Heavy assistance the first x3 reps, but less assistance by the final x2 reps. Pt with R foot placed anterior to the L for x4 reps to focus on L leg strengthening.    Ambulation/Gait Ambulation/Gait assistance: Min assist Gait Distance (Feet): 130 Feet Assistive device: Rolling walker (2 wheels) Gait Pattern/deviations: Decreased stride length, Decreased dorsiflexion - left, Wide base of support, Trunk flexed, Step-through pattern, Decreased step length - left Gait velocity: reduced Gait velocity interpretation: <1.31 ft/sec, indicative of household ambulator   General Gait Details: heavy UE reliance and pt having to adjust L grip at times. Tactile (at posterior thight), verbal, and visual cues provided for L heel strike, improved L foot clearance, and increased L step length for step-through gait pattern. Good success noted. MinA for stability.   Stairs             Wheelchair Mobility    Modified Rankin (Stroke Patients Only) Modified Rankin (Stroke Patients Only) Pre-Morbid Rankin Score: Moderate disability Modified Rankin: Moderately severe disability     Balance Overall balance assessment: Needs assistance Sitting-balance support: Feet supported Sitting balance-Leahy Scale: Good Sitting balance - Comments: seated EOB  Standing balance support: Bilateral upper extremity supported, Reliant on assistive device for balance Standing balance-Leahy Scale: Poor Standing  balance comment: UE reliance and physical assistance needed                            Cognition Arousal/Alertness: Awake/alert Behavior During Therapy: WFL for tasks assessed/performed, Flat affect Overall Cognitive Status: Impaired/Different from baseline Area of Impairment: Problem solving, Attention                   Current Attention Level: Selective         Problem Solving: Slow processing General Comments: More aware of his deficits today, reporting his L leg is only 50% back to what it was previously        Exercises General Exercises - Upper Extremity Shoulder Flexion: AAROM, Left, 10 reps, Seated Elbow Extension: PROM, Left, Other reps (comment), Seated, Supine (1x ~30 sec stretch into elbow extension sitting EOB, placed pt with arm supported and elbow positioned in extended position supine in bed end of session) General Exercises - Lower Extremity Long Arc Quad: AROM, Left, 5 reps, Seated (with eccentric control emphasis) Hip Flexion/Marching: AROM, Strengthening, Both, 10 reps, Standing (with RW) Other Exercises Other Exercises: L shoulder external rotation with L supination AAROM 10x sitting EOB Other Exercises: sit <> stand x4 reps with L hand on bed to push up and R foot anterior to L to encourage L leg utilization, bed elevated various heights, modA    General Comments General comments (skin integrity, edema, etc.): SBP remained in 120s-130s pre and post session      Pertinent Vitals/Pain Pain Assessment Pain Assessment: No/denies pain    Home Living                          Prior Function            PT Goals (current goals can now be found in the care plan section) Acute Rehab PT Goals Patient Stated Goal: to go to [facility] then home PT Goal Formulation: With patient Time For Goal Achievement: 05/17/22 Potential to Achieve Goals: Good Progress towards PT goals: Progressing toward goals    Frequency    Min  4X/week      PT Plan Current plan remains appropriate    Co-evaluation              AM-PAC PT "6 Clicks" Mobility   Outcome Measure  Help needed turning from your back to your side while in a flat bed without using bedrails?: A Little Help needed moving from lying on your back to sitting on the side of a flat bed without using bedrails?: A Little Help needed moving to and from a bed to a chair (including a wheelchair)?: A Little Help needed standing up from a chair using your arms (e.g., wheelchair or bedside chair)?: A Lot Help needed to walk in hospital room?: A Little Help needed climbing 3-5 steps with a railing? : Total 6 Click Score: 15    End of Session Equipment Utilized During Treatment: Gait belt Activity Tolerance: Patient tolerated treatment well Patient left: in bed;with call bell/phone within reach;with bed alarm set Nurse Communication: Mobility status PT Visit Diagnosis: Other abnormalities of gait and mobility (R26.89);Hemiplegia and hemiparesis;Unsteadiness on feet (R26.81);Muscle weakness (generalized) (M62.81);Difficulty in walking, not elsewhere classified (R26.2);Other symptoms and signs involving the nervous system (R29.898) Hemiplegia - Right/Left: Left Hemiplegia -  dominant/non-dominant: Non-dominant 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

## 2022-05-06 NOTE — Progress Notes (Addendum)
STROKE TEAM PROGRESS NOTE   INTERVAL HISTORY Patient is seen in his room with no family at the bedside.  His vital signs have been stable, and his neurological exam remains stable.  Headaches have continued without much improvement from Topamax, will consider other therapies if headaches continue this afternoon.  He has decided that he would like to have a loop recorder placed.  Cardiology notified.  Vitals:   05/05/22 2349 05/06/22 0402 05/06/22 0738 05/06/22 1129  BP: (!) 166/78 (!) 157/72 118/62 120/63  Pulse: 61 (!) 59 69 79  Resp: 20 18 16 16  $ Temp: 98.1 F (36.7 C) 98 F (36.7 C) 98 F (36.7 C) 98.3 F (36.8 C)  TempSrc: Oral Oral Oral Oral  SpO2: 95% 99% 100% 96%  Weight:      Height:       CBC:  Recent Labs  Lab 05/02/22 1414 05/05/22 0444 05/06/22 0311  WBC 5.9 6.5 6.2  NEUTROABS 4.4  --   --   HGB 14.0 13.8 13.9  HCT 41.3 39.5 40.9  MCV 94.5 91.2 92.5  PLT 214 177 [id number]    Basic Metabolic Panel:  Recent Labs  Lab 05/05/22 0444 05/06/22 0311  NA 135 134*  K 3.2* 3.5  CL 98 96*  CO2 28 29  GLUCOSE 180* 183*  BUN 22 16  CREATININE 1.18 1.14  CALCIUM 8.8* 9.0    Lipid Panel:  Recent Labs  Lab 05/05/22 0444  CHOL 85  TRIG 59  HDL 40*  CHOLHDL 2.1  VLDL 12  LDLCALC 33    HgbA1c:  Recent Labs  Lab 05/02/22 1941  HGBA1C 8.3*    Urine Drug Screen:  Recent Labs  Lab 05/03/22 0832  LABOPIA NONE DETECTED  COCAINSCRNUR NONE DETECTED  LABBENZ NONE DETECTED  AMPHETMU NONE DETECTED  THCU NONE DETECTED  LABBARB NONE DETECTED     Alcohol Level No results for input(s): "ETH" in the last 168 hours.  IMAGING past 24 hours No results found.  PHYSICAL EXAM  Physical Exam  Constitutional: Appears well-developed and well-nourished middle-age Caucasian male.   Cardiovascular: Normal rate and regular rhythm.  Respiratory: Effort normal, non-labored breathing  Neuro: Mental Status: Patient is awake, alert, oriented to person, place, month,  year, and situation. Patient is able to give a clear and coherent history. No signs of aphasia or neglect Cranial Nerves: II: Field cut left  III,IV, VI: EOMI without ptosis or diploplia.  V: Facial sensation is symmetric to temperature VII: Facial movement is symmetric resting and smiling VIII: Hearing is intact to voice X: Palate elevates symmetrically XI: Shoulder shrug is symmetric. XII: Tongue protrudes midline without atrophy or fasciculations.  Motor: Tone is normal. Bulk is normal. RUE 5/5 LUE 4/5 RLE 5/5 LLE 4+/5 Sensory: Sensation is symmetric to light touch and temperature in the arms and legs.  Cerebellar: FNF  intact on the right, within limits of weakness on the left   ASSESSMENT/PLAN [name] is a 67 y.o. male with history of previous stroke with left-sided weakness who presents with headache that started yesterday as well as some chest pain that started overnight.  Due to his headache he had a head CT obtained which shows a fairly large hemorrhagic infarct.    Stroke:  right MCA large infarct with spontaneous hemorrhagic transformation, etiology could be large vessel disease versus cardioembolic source Code Stroke CT head Acute hemorrhagic infarction affecting the right posterior parietal lobe. The region of infarction measures 7.6 x 3.6 x  4.1 cm. MRI - Redemonstrated area of infarction with hemorrhage in the right temporoparietal region, 2 mm right-to-left midline shift. MRA  Suspect severe stenosis in the left P2 CT repeat 2/18 evolving HT, no new finding 2D Echo EF 50-55% Will need loop recorder before discharge. Pt is in agreement LDL 33 HgbA1c 8.3 UDS negative VTE prophylaxis -heparin subcu aspirin 81 mg daily prior to admission, now on ASA 81. Therapy recommendations: CIR Disposition:  pending     History of stroke 02/2021 admitted for left-sided weakness.  CT showed right frontal ICH/SAH.  CTA head and neck unremarkable.  IR showed left VA  origin moderate stenosis, right VA and subclavian artery atherosclerosis.  MRI showed right frontal infarct with hemorrhagic transformation.  Repeat MRI later showed new right occipital infarct with enlargement of posterior frontal infarct, stable HT and SAH.  CTA head and neck showed focal calcification within the right PCA distal P3 segment.  EF 45 to 50%, LDL 106, A1c is 11.1.  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  Discharged on aspirin 81 and Crestor. Outpatient 30-day cardiac event monitoring 05/2021 showed no A-fib.  Hypertension Congestive Heart Failure Home meds:  Coreg, lasix, hydrochlorothiazide, lopressor Stable Cleviprex- off  PRN Hydralazine  On Coreg and HCTZ  Hyperlipidemia Home meds:  Crestor 57m, resumed in hospital LDL 33, goal < 70 Continue statin at discharge  Diabetes type II Uncontrolled Home meds:  Farxiga, insulin, trulicity [id number], goal < 7.0 CBGs SSI Close PCP follow-up for better DM control  Other Stroke Risk Factors Obesity, Body mass index is 37.72 kg/m., BMI >/= 30 associated with increased stroke risk, recommend weight loss, diet and exercise as appropriate  Obstructive sleep apnea - Home BiPAP at night and with naps  Other Active Problems   Hospital day # 4  Patient seen and examined by NP/APP with MD. MD to update note as needed.   CThrockmorton, MSN, AGACNP-BC [facility] See Amion for schedule and pager information 05/06/2022 12:48 PM  ATTENDING NOTE: I reviewed above note and agree with the assessment and plan. Pt was seen and examined.   No family at the bedside. Pt lying in bed, AAO x 3, no acute event overnight, neuro stable. Agree with loop recorder before discharge. Repeat CT head showed HT is being absorbed, no new finding. Will start ASA 81 for stroke prevention. Continue statin. Pending CIR placement.   For detailed assessment and plan, please refer to above/below as I have made changes  wherever appropriate.   JRosalin Hawking MD PhD Stroke Neurology 05/06/2022 5:14 PM    To contact Stroke Continuity provider, please refer to [email or web address] After hours, contact General Neurology

## 2022-05-07 ENCOUNTER — Encounter (HOSPITAL_COMMUNITY): Payer: Self-pay | Admitting: Physical Medicine & Rehabilitation

## 2022-05-07 ENCOUNTER — Other Ambulatory Visit (HOSPITAL_COMMUNITY): Payer: Self-pay

## 2022-05-07 ENCOUNTER — Encounter (HOSPITAL_COMMUNITY)
Admission: EM | Disposition: A | Discharge status: Rehabilitation Facility | Payer: Self-pay | Source: Home / Self Care | Attending: Neurology

## 2022-05-07 ENCOUNTER — Inpatient Hospital Stay (HOSPITAL_COMMUNITY)
Admission: RE | Admit: 2022-05-07 | Discharge: 2022-05-29 | DRG: 057 | Disposition: A | Discharge status: Home Health | Payer: BC Managed Care – PPO | Source: Intra-hospital | Attending: Physical Medicine & Rehabilitation | Admitting: Physical Medicine & Rehabilitation

## 2022-05-07 DIAGNOSIS — I69398 Other sequelae of cerebral infarction: Secondary | ICD-10-CM | POA: Diagnosis not present

## 2022-05-07 DIAGNOSIS — Z794 Long term (current) use of insulin: Secondary | ICD-10-CM | POA: Diagnosis not present

## 2022-05-07 DIAGNOSIS — Z7982 Long term (current) use of aspirin: Secondary | ICD-10-CM | POA: Diagnosis not present

## 2022-05-07 DIAGNOSIS — I10 Essential (primary) hypertension: Secondary | ICD-10-CM | POA: Diagnosis present

## 2022-05-07 DIAGNOSIS — E785 Hyperlipidemia, unspecified: Secondary | ICD-10-CM | POA: Diagnosis present

## 2022-05-07 DIAGNOSIS — E876 Hypokalemia: Secondary | ICD-10-CM | POA: Diagnosis present

## 2022-05-07 DIAGNOSIS — Z833 Family history of diabetes mellitus: Secondary | ICD-10-CM

## 2022-05-07 DIAGNOSIS — I63511 Cerebral infarction due to unspecified occlusion or stenosis of right middle cerebral artery: Secondary | ICD-10-CM | POA: Diagnosis not present

## 2022-05-07 DIAGNOSIS — Z79899 Other long term (current) drug therapy: Secondary | ICD-10-CM | POA: Diagnosis not present

## 2022-05-07 DIAGNOSIS — R32 Unspecified urinary incontinence: Secondary | ICD-10-CM | POA: Diagnosis present

## 2022-05-07 DIAGNOSIS — E119 Type 2 diabetes mellitus without complications: Secondary | ICD-10-CM | POA: Diagnosis present

## 2022-05-07 DIAGNOSIS — I69354 Hemiplegia and hemiparesis following cerebral infarction affecting left non-dominant side: Secondary | ICD-10-CM | POA: Diagnosis present

## 2022-05-07 DIAGNOSIS — Z7984 Long term (current) use of oral hypoglycemic drugs: Secondary | ICD-10-CM

## 2022-05-07 DIAGNOSIS — A0811 Acute gastroenteropathy due to Norwalk agent: Secondary | ICD-10-CM | POA: Diagnosis not present

## 2022-05-07 DIAGNOSIS — Z7985 Long-term (current) use of injectable non-insulin antidiabetic drugs: Secondary | ICD-10-CM

## 2022-05-07 DIAGNOSIS — I63411 Cerebral infarction due to embolism of right middle cerebral artery: Secondary | ICD-10-CM | POA: Diagnosis not present

## 2022-05-07 DIAGNOSIS — G4733 Obstructive sleep apnea (adult) (pediatric): Secondary | ICD-10-CM | POA: Diagnosis present

## 2022-05-07 DIAGNOSIS — E039 Hypothyroidism, unspecified: Secondary | ICD-10-CM | POA: Diagnosis present

## 2022-05-07 DIAGNOSIS — I69319 Unspecified symptoms and signs involving cognitive functions following cerebral infarction: Secondary | ICD-10-CM

## 2022-05-07 DIAGNOSIS — H539 Unspecified visual disturbance: Secondary | ICD-10-CM | POA: Diagnosis present

## 2022-05-07 DIAGNOSIS — Z87891 Personal history of nicotine dependence: Secondary | ICD-10-CM | POA: Diagnosis not present

## 2022-05-07 HISTORY — PX: LOOP RECORDER INSERTION: EP1214

## 2022-05-07 LAB — CBC
HCT: 41.4 % (ref 39.0–52.0)
HCT: 43.7 % (ref 39.0–52.0)
Hemoglobin: 14.6 g/dL (ref 13.0–17.0)
Hemoglobin: 15.4 g/dL (ref 13.0–17.0)
MCH: 32.1 pg (ref 26.0–34.0)
MCH: 32.5 pg (ref 26.0–34.0)
MCHC: 35.3 g/dL (ref 30.0–36.0)
MCHC: 35.2 g/dL (ref 30.0–36.0)
MCV: 91 fL (ref 80.0–100.0)
MCV: 92.2 fL (ref 80.0–100.0)
Platelets: 173 10*3/uL (ref 150–400)
Platelets: 216 10*3/uL (ref 150–400)
RBC: 4.55 MIL/uL (ref 4.22–5.81)
RBC: 4.74 MIL/uL (ref 4.22–5.81)
RDW: 14.6 % (ref 11.5–15.5)
RDW: 14.5 % (ref 11.5–15.5)
WBC: 6.1 10*3/uL (ref 4.0–10.5)
WBC: 8.1 10*3/uL (ref 4.0–10.5)
nRBC: 0 % (ref 0.0–0.2)
nRBC: 0 % (ref 0.0–0.2)

## 2022-05-07 LAB — GLUCOSE, CAPILLARY
Glucose-Capillary: 210 mg/dL — ABNORMAL HIGH (ref 70–99)
Glucose-Capillary: 228 mg/dL — ABNORMAL HIGH (ref 70–99)
Glucose-Capillary: 216 mg/dL — ABNORMAL HIGH (ref 70–99)
Glucose-Capillary: 271 mg/dL — ABNORMAL HIGH (ref 70–99)

## 2022-05-07 LAB — BASIC METABOLIC PANEL
Anion gap: 13 (ref 5–15)
BUN: 18 mg/dL (ref 8–23)
CO2: 25 mmol/L (ref 22–32)
Calcium: 9.2 mg/dL (ref 8.9–10.3)
Chloride: 98 mmol/L (ref 98–111)
Creatinine, Ser: 1.14 mg/dL (ref 0.61–1.24)
GFR, Estimated: >60 mL/min (ref >60)
Glucose, Bld: 202 mg/dL — ABNORMAL HIGH (ref 70–99)
Potassium: 3.1 mmol/L — ABNORMAL LOW (ref 3.5–5.1)
Sodium: 136 mmol/L (ref 135–145)

## 2022-05-07 LAB — CREATININE, SERUM
Creatinine, Ser: 1.36 mg/dL — ABNORMAL HIGH (ref 0.61–1.24)
GFR, Estimated: 58 mL/min — ABNORMAL LOW (ref >60)

## 2022-05-07 SURGERY — LOOP RECORDER INSERTION
Anesthesia: LOCAL

## 2022-05-07 MED ORDER — ACETAMINOPHEN 650 MG RE SUPP: 650.0000 mg | RECTAL | Status: DC | PRN

## 2022-05-07 MED ORDER — LEVOTHYROXINE SODIUM 25 MCG PO TABS
125.0000 ug | ORAL_TABLET | Freq: Every day | ORAL | Status: DC
  Administered 2022-05-08 – 2022-05-29 (×22): 125 ug via ORAL
  Filled 2022-05-07 (×23): qty 1

## 2022-05-07 MED ORDER — DULOXETINE HCL 20 MG PO CPEP
20.0000 mg | ORAL_CAPSULE | Freq: Two times a day (BID) | ORAL | 0 refills | Status: DC
  Filled 2022-05-07: qty 30, 15d supply, fill #0

## 2022-05-07 MED ORDER — SIMETHICONE 80 MG PO CHEW
80.0000 mg | CHEWABLE_TABLET | Freq: Four times a day (QID) | ORAL | Status: DC | PRN
  Administered 2022-05-22 – 2022-05-25 (×8): 80 mg via ORAL
  Filled 2022-05-07 (×9): qty 1

## 2022-05-07 MED ORDER — SENNOSIDES-DOCUSATE SODIUM 8.6-50 MG PO TABS
1.0000 | ORAL_TABLET | Freq: Two times a day (BID) | ORAL | Status: DC
  Administered 2022-05-07 – 2022-05-18 (×3): 1 via ORAL
  Filled 2022-05-07 (×25): qty 1

## 2022-05-07 MED ORDER — LOPERAMIDE HCL 2 MG PO CAPS
2.0000 mg | ORAL_CAPSULE | Freq: Once | ORAL | Status: AC
  Administered 2022-05-07: 2 mg via ORAL
  Filled 2022-05-07: qty 1

## 2022-05-07 MED ORDER — LIDOCAINE-EPINEPHRINE 1 %-1:100000 IJ SOLN
INTRAMUSCULAR | Status: AC
  Filled 2022-05-07: qty 1

## 2022-05-07 MED ORDER — INSULIN ASPART 100 UNIT/ML IJ SOLN
0.0000 [IU] | Freq: Three times a day (TID) | INTRAMUSCULAR | Status: DC
  Administered 2022-05-08 – 2022-05-09 (×4): 5 [IU] via SUBCUTANEOUS
  Administered 2022-05-09: 3 [IU] via SUBCUTANEOUS
  Administered 2022-05-09: 8 [IU] via SUBCUTANEOUS
  Administered 2022-05-10: 5 [IU] via SUBCUTANEOUS
  Administered 2022-05-10 (×2): 8 [IU] via SUBCUTANEOUS
  Administered 2022-05-11 (×2): 5 [IU] via SUBCUTANEOUS
  Administered 2022-05-11: 8 [IU] via SUBCUTANEOUS
  Administered 2022-05-12: 5 [IU] via SUBCUTANEOUS
  Administered 2022-05-12: 7 [IU] via SUBCUTANEOUS
  Administered 2022-05-12 – 2022-05-13 (×2): 8 [IU] via SUBCUTANEOUS
  Administered 2022-05-13: 5 [IU] via SUBCUTANEOUS
  Administered 2022-05-13: 3 [IU] via SUBCUTANEOUS
  Administered 2022-05-14 (×2): 5 [IU] via SUBCUTANEOUS
  Administered 2022-05-14 – 2022-05-15 (×2): 3 [IU] via SUBCUTANEOUS
  Administered 2022-05-15 (×2): 5 [IU] via SUBCUTANEOUS
  Administered 2022-05-16 (×2): 3 [IU] via SUBCUTANEOUS
  Administered 2022-05-17: 2 [IU] via SUBCUTANEOUS
  Administered 2022-05-17: 3 [IU] via SUBCUTANEOUS
  Administered 2022-05-18 – 2022-05-19 (×3): 2 [IU] via SUBCUTANEOUS
  Administered 2022-05-19: 3 [IU] via SUBCUTANEOUS
  Administered 2022-05-20 (×2): 2 [IU] via SUBCUTANEOUS
  Administered 2022-05-20 – 2022-05-23 (×6): 3 [IU] via SUBCUTANEOUS
  Administered 2022-05-23: 2 [IU] via SUBCUTANEOUS
  Administered 2022-05-23: 5 [IU] via SUBCUTANEOUS
  Administered 2022-05-24 – 2022-05-25 (×3): 3 [IU] via SUBCUTANEOUS
  Administered 2022-05-25: 2 [IU] via SUBCUTANEOUS
  Administered 2022-05-26: 3 [IU] via SUBCUTANEOUS
  Administered 2022-05-26 – 2022-05-29 (×5): 2 [IU] via SUBCUTANEOUS

## 2022-05-07 MED ORDER — ACETAMINOPHEN 160 MG/5ML PO SOLN
650.0000 mg | ORAL | Status: DC | PRN
  Filled 2022-05-07: qty 20.3

## 2022-05-07 MED ORDER — LIDOCAINE-EPINEPHRINE 1 %-1:100000 IJ SOLN
INTRAMUSCULAR | Status: DC | PRN
  Administered 2022-05-07: 20 mL

## 2022-05-07 MED ORDER — DULOXETINE HCL 20 MG PO CPEP
20.0000 mg | ORAL_CAPSULE | Freq: Two times a day (BID) | ORAL | Status: DC
  Administered 2022-05-07 – 2022-05-29 (×44): 20 mg via ORAL
  Filled 2022-05-07 (×45): qty 1

## 2022-05-07 MED ORDER — ONDANSETRON 4 MG PO TBDP
4.0000 mg | ORAL_TABLET | Freq: Three times a day (TID) | ORAL | Status: DC | PRN
  Administered 2022-05-22 – 2022-05-24 (×3): 4 mg via ORAL
  Filled 2022-05-07 (×3): qty 1

## 2022-05-07 MED ORDER — ROSUVASTATIN CALCIUM 20 MG PO TABS
40.0000 mg | ORAL_TABLET | Freq: Every day | ORAL | Status: DC
  Administered 2022-05-08 – 2022-05-29 (×22): 40 mg via ORAL
  Filled 2022-05-07 (×23): qty 2

## 2022-05-07 MED ORDER — ONDANSETRON 4 MG PO TBDP
4.0000 mg | ORAL_TABLET | Freq: Three times a day (TID) | ORAL | 0 refills | Status: DC | PRN
  Filled 2022-05-07: qty 20, 7d supply, fill #0

## 2022-05-07 MED ORDER — PANTOPRAZOLE SODIUM 40 MG PO TBEC
40.0000 mg | DELAYED_RELEASE_TABLET | Freq: Every day | ORAL | Status: DC
  Administered 2022-05-07 – 2022-05-28 (×22): 40 mg via ORAL
  Filled 2022-05-07 (×22): qty 1

## 2022-05-07 MED ORDER — SENNOSIDES-DOCUSATE SODIUM 8.6-50 MG PO TABS
1.0000 | ORAL_TABLET | Freq: Two times a day (BID) | ORAL | 0 refills | Status: DC
  Filled 2022-05-07: qty 30, 15d supply, fill #0

## 2022-05-07 MED ORDER — TOPIRAMATE 50 MG PO TABS
50.0000 mg | ORAL_TABLET | Freq: Every day | ORAL | 0 refills | Status: DC
  Filled 2022-05-07: qty 30, 30d supply, fill #0

## 2022-05-07 MED ORDER — HEPARIN SODIUM (PORCINE) 5000 UNIT/ML IJ SOLN
5000.0000 [IU] | Freq: Three times a day (TID) | INTRAMUSCULAR | Status: DC
  Administered 2022-05-07 – 2022-05-08 (×2): 5000 [IU] via SUBCUTANEOUS
  Filled 2022-05-07 (×2): qty 1

## 2022-05-07 MED ORDER — ACETAMINOPHEN 325 MG PO TABS
650.0000 mg | ORAL_TABLET | ORAL | Status: DC | PRN
  Administered 2022-05-09 – 2022-05-24 (×15): 650 mg via ORAL
  Filled 2022-05-07 (×18): qty 2

## 2022-05-07 MED ORDER — LEVOTHYROXINE SODIUM 125 MCG PO TABS
125.0000 ug | ORAL_TABLET | Freq: Every day | ORAL | 0 refills | Status: DC
  Filled 2022-05-07: qty 30, 30d supply, fill #0

## 2022-05-07 MED ORDER — CARVEDILOL 12.5 MG PO TABS
12.5000 mg | ORAL_TABLET | Freq: Two times a day (BID) | ORAL | Status: DC
  Administered 2022-05-08 – 2022-05-29 (×41): 12.5 mg via ORAL
  Filled 2022-05-07 (×43): qty 1

## 2022-05-07 MED ORDER — ASPIRIN 81 MG PO TBEC
81.0000 mg | DELAYED_RELEASE_TABLET | Freq: Every day | ORAL | Status: DC
  Administered 2022-05-08 – 2022-05-29 (×22): 81 mg via ORAL
  Filled 2022-05-07 (×22): qty 1

## 2022-05-07 MED ORDER — SIMETHICONE 80 MG PO CHEW
80.0000 mg | CHEWABLE_TABLET | Freq: Four times a day (QID) | ORAL | 0 refills | Status: DC | PRN
  Filled 2022-05-07: qty 30, 8d supply, fill #0

## 2022-05-07 MED ORDER — HEPARIN SODIUM (PORCINE) 5000 UNIT/ML IJ SOLN: 5000.0000 [IU] | Freq: Three times a day (TID) | INTRAMUSCULAR | Status: DC

## 2022-05-07 MED ORDER — HYDROCHLOROTHIAZIDE 25 MG PO TABS
25.0000 mg | ORAL_TABLET | Freq: Every day | ORAL | Status: DC
  Administered 2022-05-08 – 2022-05-23 (×16): 25 mg via ORAL
  Filled 2022-05-07 (×16): qty 1

## 2022-05-07 MED ORDER — ASPIRIN 81 MG PO TBEC
81.0000 mg | DELAYED_RELEASE_TABLET | Freq: Every day | ORAL | 12 refills | Status: DC
  Filled 2022-05-07: qty 30, 30d supply, fill #0

## 2022-05-07 MED ORDER — TOPIRAMATE 25 MG PO TABS
50.0000 mg | ORAL_TABLET | Freq: Every day | ORAL | Status: DC
  Administered 2022-05-08 – 2022-05-29 (×22): 50 mg via ORAL
  Filled 2022-05-07 (×22): qty 2

## 2022-05-07 SURGICAL SUPPLY — 2 items
MONITOR CARDIAC ASSERT IQ EL (Prosthesis & Implant Heart) IMPLANT
PACK LOOP INSERTION (CUSTOM PROCEDURE TRAY) ×1 IMPLANT

## 2022-05-07 NOTE — PMR Pre-admission (Signed)
PMR Admission Coordinator Pre-Admission Assessment  Patient: Joseph Ortega is an 67 y.o., male MRN: [id number] DOB: [birth year] Height: 6' 1"$  (185.4 cm) Weight: 112 kg  Insurance Information HMO:     PPO:  yes    PCP:      IPA:      80/20:      OTHER:  PRIMARY: [employer] [place]      Policy#: [id number]       Subscriber: Pt.  CM Name:       Phone#: [phone]     Fax#: [phone]     Pre-Cert#: [id number]       Employer: Amy with [employer] faxed auth for 2/19 admit with updates due 3/4/ Benefits:  Phone #:      Name:  Irene Shipper Date: 03/19/2022 - still active Deductible: $500 ($500 met) OOP Max: $1,500 ($1,500 met) CIR: 80% coverage, 20% co-insurance SNF: 80% coverage, 20% co-insurance; with a limit of 60 visits/cal year (60 remaining) Outpatient:  $30 co-pay/visit; 30 visit limit combined/cal yr (0 remaining) Home Health: 80% coverage, 20% co-insurance DME: 80% coverage, 20% co-insurance Providers: in network   SECONDARY:       Policy#:      Phone#:   The Therapist, art Information Summary" for patients in Inpatient Rehabilitation Facilities with attached "Privacy Act [place] Records" was provided and verbally reviewed with: n/a  Emergency Contact Information Contact Information     Name Relation Home Work Mobile   Kiger,[name] Spouse   [phone]       Current Medical History  Patient Admitting Diagnosis: CVA History of Present Illness: Joseph Ortega is a 67 year old right-handed male with history of hypertension as well as diabetes mellitus as well as 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  Per chart review patient lives with wife.  1 level home with ramped entrance.  Wife is reportedly limited mobility.  Presented 05/02/2022 with headache and nonspecific chest pain.  Cranial CT scan showed acute hemorrhagic infarct affecting the right posterior parietal lobe.  The region of infarction measuring 7.6 x 3.6 x 4.1 cm.   Old infarction of the right frontal parietal vertex and right occipital lobe.  CT angiogram head and neck no large vessel occlusion.  MRI redemonstrated infarction with hemorrhage in the right temporoparietal region.  Patient did not receive tPA.  Echocardiogram with ejection fraction of 50 to [id number] grade 1 diastolic dysfunction.  Neurology follow-up patient was placed on Cleviprex for blood pressure control.  Marland Kitchen  His aspirin was initially held and has since been resumed.  He was later cleared to begin subcutaneous heparin for DVT prophylaxis 05/05/2022.  Latest cranial CT scan 05/06/2022 unchanged no frank hematoma seen.  Awaiting plan for loop recorder.  Tolerating a regular consistency diet.  Therapy evaluations completed due to patient decreased functional mobility was admitted for a comprehensive rehab program.  Complete NIHSS TOTAL: 4  Patient's medical record from [facility]  has been reviewed by the rehabilitation admission coordinator and physician.  Past Medical History  Past Medical History:  Diagnosis Date   Diabetes mellitus without complication ([place])    Hypertension    Thyroid disease     Has the patient had major surgery during 100 days prior to admission? No  Family History   family history includes Diabetes in his father.  Current Medications  Current Facility-Administered Medications:    acetaminophen (TYLENOL) tablet 650 mg, 650 mg, Oral, Q4H PRN, 650 mg at 05/06/22  1018 **OR** acetaminophen (TYLENOL) 160 MG/5ML solution 650 mg, 650 mg, Per Tube, Q4H PRN **OR** acetaminophen (TYLENOL) suppository 650 mg, 650 mg, Rectal, Q4H PRN, Greta Doom, MD   aspirin EC tablet 81 mg, 81 mg, Oral, Daily, Rosalin Hawking, MD, 81 mg at 05/07/22 0940   carvedilol (COREG) tablet 12.5 mg, 12.5 mg, Oral, BID WC, Greta Doom, MD, 12.5 mg at 05/07/22 [id number]   Chlorhexidine Gluconate Cloth 2 % PADS 6 each, 6 each, Topical, Q0600, Garvin Fila, MD, 6 each at  05/07/22 0622   clevidipine (CLEVIPREX) infusion 0.5 mg/mL, 0-21 mg/hr, Intravenous, Continuous, Zelaya, Oscar A, PA-C, Stopped at 05/03/22 1451   DULoxetine (CYMBALTA) DR capsule 20 mg, 20 mg, Oral, BID, Greta Doom, MD, 20 mg at 05/07/22 0940   heparin injection 5,000 Units, 5,000 Units, Subcutaneous, Q8H, Rosalin Hawking, MD, 5,000 Units at 05/07/22 1502   hydrALAZINE (APRESOLINE) injection 20 mg, 20 mg, Intravenous, Q4H PRN, Charlean Merl, Marcelino Scot, NP, 20 mg at 05/06/22 [id number]   hydrochlorothiazide (HYDRODIURIL) tablet 25 mg, 25 mg, Oral, Daily, Shafer, Devon, NP, 25 mg at 05/07/22 0940   insulin aspart (novoLOG) injection 0-15 Units, 0-15 Units, Subcutaneous, TID WC, Greta Doom, MD, 5 Units at 05/07/22 1307   levothyroxine (SYNTHROID) tablet 125 mcg, 125 mcg, Oral, Q0600, Greta Doom, MD, 125 mcg at 05/07/22 0620   mupirocin ointment (BACTROBAN) 2 % 1 Application, 1 Application, Nasal, BID, Garvin Fila, MD, 1 Application at [id number]   ondansetron (ZOFRAN-ODT) disintegrating tablet 4 mg, 4 mg, Oral, Q8H PRN, de Yolanda Manges, Cortney E, NP   pantoprazole (PROTONIX) EC tablet 40 mg, 40 mg, Oral, QHS, Sethi, Pramod S, MD, 40 mg at 05/06/22 2125   rosuvastatin (CRESTOR) tablet 40 mg, 40 mg, Oral, Daily, Greta Doom, MD, 40 mg at 05/07/22 0940   senna-docusate (Senokot-S) tablet 1 tablet, 1 tablet, Oral, BID, Greta Doom, MD, 1 tablet at 05/06/22 1018   simethicone (MYLICON) chewable tablet 80 mg, 80 mg, Oral, QID PRN, de Yolanda Manges, Cortney E, NP, 80 mg at 05/06/22 1714   topiramate (TOPAMAX) tablet 50 mg, 50 mg, Oral, Daily, de Yolanda Manges, [place] E, NP, 50 mg at 05/07/22 0940  Patients Current Diet:  Diet Order             Diet heart healthy/carb modified Room service appropriate? Yes with Assist; Fluid consistency: Thin  Diet effective now                   Precautions / Restrictions Precautions Precautions: Fall Precaution Comments: L  weakness; BP <150 Restrictions Weight Bearing Restrictions: No   Has the patient had 2 or more falls or a fall with injury in the past year? No  Prior Activity Level Community (5-7x/wk): Pt. active in the community PTA  Prior Functional Level Self Care: Did the patient need help bathing, dressing, using the toilet or eating? Independent  Indoor Mobility: Did the patient need assistance with walking from room to room (with or without device)? Independent  Stairs: Did the patient need assistance with internal or external stairs (with or without device)? Independent  Functional Cognition: Did the patient need help planning regular tasks such as shopping or remembering to take medications? Needed some help  Patient Information Are you of Hispanic, Latino/a,or Spanish origin?: A. No, not of Hispanic, Latino/a, or Spanish origin What is your race?: A. White Do you need or want an interpreter to communicate with a doctor or  health care staff?: 0. No  Patient's Response To:  Health Literacy and Transportation Is the patient able to respond to health literacy and transportation needs?: Yes Health Literacy - How often do you need to have someone help you when you read instructions, pamphlets, or other written material from your doctor or pharmacy?: Sometimes In the past 12 months, has lack of transportation kept you from medical appointments or from getting medications?: No In the past 12 months, has lack of transportation kept you from meetings, work, or from getting things needed for daily living?: No  Development worker, international aid / [place] Devices/Equipment: CPAP Home Equipment: Conservation officer, nature (2 wheels), Shower seat, Grab bars - tub/shower, Grab bars - toilet, Rollator (4 wheels)  Prior Device Use: Indicate devices/aids used by the patient prior to current illness, exacerbation or injury? Walker  Current Functional Level Cognition  Arousal/Alertness: Awake/alert Overall  Cognitive Status: Impaired/Different from baseline Current Attention Level: Selective Orientation Level: Oriented X4 General Comments: Mildly slower processing Attention: Focused, Sustained Focused Attention: Appears intact Sustained Attention: Appears intact Memory: Impaired Memory Impairment:  (Immediate: 5/5 with repetition, delayed: 4/5 with cues: 1/1) Awareness: Appears intact Problem Solving: Impaired Problem Solving Impairment: Verbal complex (money: 0/3; time: 1/2) Executive Function: Sequencing, Technical brewer: Impaired Sequencing Impairment: Verbal complex (clock: 0/4) Organizing:  (backward digit span: 2/2)    Extremity Assessment (includes Sensation/Coordination)  Upper Extremity Assessment: LUE deficits/detail, RUE deficits/detail RUE Deficits / Details: Full A/ROM with 5/5 strength in all ranges. RUE Coordination: WNL (9 hole peg test: 29.17") LUE Deficits / Details: History of CVA with left side involvement (hemiparesis). A/ROM shoulder flexion: ~5 degrees. elbow flexion/extension: WFL when arm is off loaded in a gravity eliminated plane. Limited wrist extension;full wrist flexion. wrist supination ~25% range. Able to make a full fist and demonstrates a weak gross grasp. Able to demonstrate composite finger extension 50-75%. Limited thumb ROM. Pt reports that he was able to hold a cup in his left hand previously and he is unable to do so now and reports decreased thumb ROM. LUE Sensation: WNL LUE Coordination: decreased fine motor, decreased gross motor (Unable to complete 9 hole peg test even with set-up of pegs)  Lower Extremity Assessment: LLE deficits/detail LLE Deficits / Details: increased extensor tone noted, but AAROM grossly WFL, strength hip flexion 3-/5, knee extension 4-/5, ankle DF 2/5    ADLs  Overall ADL's : Needs assistance/impaired Eating/Feeding: Set up, Sitting Eating/Feeding Details (indicate cue type and reason): assist to open containers as  needed Grooming: Wash/dry hands, Wash/dry face, Oral care, Min guard, Standing Grooming Details (indicate cue type and reason): used LUE as gross assist Upper Body Bathing: Minimal assistance, Standing Upper Body Bathing Details (indicate cue type and reason): assistance with RUE due to limited use of LUE Lower Body Bathing: Moderate assistance, Sit to/from stand Upper Body Dressing : Minimal assistance, Sitting Upper Body Dressing Details (indicate cue type and reason): donning hospital gown while seated EOB Lower Body Dressing: Maximal assistance, Sit to/from stand Lower Body Dressing Details (indicate cue type and reason): Assisted needed to don left hospital sock Toilet Transfer: Minimal assistance, Ambulation Toilet Transfer Details (indicate cue type and reason): simulated to recliner Toileting- Clothing Manipulation and Hygiene: Minimal assistance, Sitting/lateral lean General ADL Comments: able to use LUE for gross assist; hold toothbrush when applying toothpaste.    Mobility  Overal bed mobility: Needs Assistance Bed Mobility: Supine to Sit Supine to sit: Min guard, HOB elevated Sit to supine: Min assist,  HOB elevated General bed mobility comments: min guard with trunk    Transfers  Overall transfer level: Needs assistance Equipment used: Rolling walker (2 wheels) Transfers: Sit to/from Stand Sit to Stand: Min assist General transfer comment: uses RUE only to power up, able to use LUE with RW    Ambulation / Gait / Stairs / Wheelchair Mobility  Ambulation/Gait Ambulation/Gait assistance: Herbalist (Feet): 75 Feet Assistive device: Rolling walker (2 wheels) Gait Pattern/deviations: Decreased stride length, Decreased dorsiflexion - left, Wide base of support, Trunk flexed, Step-through pattern, Decreased step length - left General Gait Details: Verbal cueing for left heel strike and neutral L foot positioning Gait velocity: reduced Gait velocity  interpretation: <1.31 ft/sec, indicative of household ambulator    Posture / Balance Dynamic Sitting Balance Sitting balance - Comments: seated EOB Balance Overall balance assessment: Needs assistance Sitting-balance support: Feet supported Sitting balance-Leahy Scale: Good Sitting balance - Comments: seated EOB Standing balance support: Bilateral upper extremity supported, Reliant on assistive device for balance Standing balance-Leahy Scale: Poor Standing balance comment: UE reliance and physical assistance needed    Special needs/care consideration Special service needs none    Previous Home Environment (from acute therapy documentation) Living Arrangements: Spouse/significant other (Wife is wheelchair bound)  Lives With: Spouse Available Help at Discharge: Family, Available 24 hours/day Type of Home: House Home Layout: One level Home Access: Ramped entrance, Stairs to enter [employer] of Steps: ramp into the house then 2 steps with grab bar Bathroom Shower/Tub: Multimedia programmer: Handicapped height Bathroom Accessibility: Yes How Accessible: Accessible via wheelchair, Accessible via walker Home Care Services: No Additional Comments: wife is in a wheelchair  Discharge Living Setting Plans for Discharge Living Setting: Patient's home Type of Home at Discharge: House Discharge Home Layout: One level Discharge Home Access: [place] entrance Discharge Bathroom Shower/Tub: Walk-in shower Discharge Bathroom Toilet: Handicapped height Discharge Bathroom Accessibility: Yes How Accessible: Accessible via wheelchair, Accessible via walker Does the patient have any problems obtaining your medications?: No  Social/Family/Support Systems Patient Roles: Partner Contact Information: [phone] Anticipated Caregiver: Jackelyn Poling (wife) Anticipated Caregiver's Contact Information: Can provide 24/7 min A Ability/Limitations of Caregiver: none Caregiver Availability:  24/7 Discharge Plan Discussed with Primary Caregiver: Yes Is Caregiver In Agreement with Plan?: Yes Does Caregiver/Family have Issues with Lodging/Transportation while Pt is in Rehab?: No  Goals Patient/Family Goal for Rehab: PT/OT/SLP Supervision Expected length of stay: 10-12 days Pt/Family Agrees to Admission and willing to participate: Yes Program Orientation Provided & Reviewed with Pt/Caregiver Including Roles  & Responsibilities: Yes  Decrease burden of Care through IP rehab admission: n/a  Possible need for SNF placement upon discharge: n/a   Patient Condition: I have reviewed medical records from [facility], spoken with CM, and patient. I met with patient at the bedside for inpatient rehabilitation assessment.  Patient will benefit from ongoing PT, OT, and SLP, can actively participate in 3 hours of therapy a day 5 days of the week, and can make measurable gains during the admission.  Patient will also benefit from the coordinated team approach during an Inpatient Acute Rehabilitation admission.  The patient will receive intensive therapy as well as Rehabilitation physician, nursing, social worker, and care management interventions.  Due to safety, skin/wound care, disease management, medication administration, pain management, and patient education the patient requires 24 hour a day rehabilitation nursing.  The patient is currently Min A with mobility and basic ADLs.  Discharge setting and therapy post discharge at home  with home health is anticipated.  Patient has agreed to participate in the Acute Inpatient Rehabilitation Program and will admit today.  Preadmission Screen Completed By:  Genella Mech, 05/07/2022 3:06 PM ______________________________________________________________________   Discussed status with Dr. Letta Pate on 05/07/22  at 68 and received approval for admission today.  Admission Coordinator:  Danise Edge, time [id number] /Date 05/07/22    Assessment/Plan: Diagnosis: Right temporo-parietal hemorrhagic infarct Does the need for close, 24 hr/day Medical supervision in concert with the patient's rehab needs make it unreasonable for this patient to be served in a less intensive setting? Yes Co-Morbidities requiring supervision/potential complications: HTN, DM, prior R CVA Due to bladder management, bowel management, safety, skin/wound care, disease management, medication administration, pain management, and patient education, does the patient require 24 hr/day rehab nursing? Yes Does the patient require coordinated care of a physician, rehab nurse, PT, OT, and SLP to address physical and functional deficits in the context of the above medical diagnosis(es)? Yes Addressing deficits in the following areas: balance, endurance, locomotion, strength, transferring, bowel/bladder control, bathing, dressing, feeding, grooming, toileting, cognition, and psychosocial support Can the patient actively participate in an intensive therapy program of at least 3 hrs of therapy 5 days a week? Yes The potential for patient to make measurable gains while on inpatient rehab is good Anticipated functional outcomes upon discharge from inpatient rehab: supervision PT, supervision OT, supervision SLP Estimated rehab length of stay to reach the above functional goals is: 10-12d Anticipated discharge destination: Home 10. Overall Rehab/Functional Prognosis: good   MD Signature: "I have personally performed a face to face diagnostic evaluation of this patient.  Additionally, I have reviewed and concur with the physician assistant's documentation above."

## 2022-05-07 NOTE — Inpatient Diabetes Management (Signed)
Inpatient Diabetes Program Recommendations  AACE/ADA: New Consensus Statement on Inpatient Glycemic Control (2015)  Target Ranges:  Prepandial:   less than 140 mg/dL      Peak postprandial:   less than 180 mg/dL (1-2 hours)      Critically ill patients:  140 - 180 mg/dL   Lab Results  Component Value Date   GLUCAP 210 (H) 05/07/2022   HGBA1C 8.3 (H) 05/02/2022    Review of Glycemic Control  Latest Reference Range & Units 05/06/22 16:23 05/06/22 21:21 05/07/22 06:05  Glucose-Capillary 70 - 99 mg/dL 187 (H) 207 (H) 210 (H)  (H): Data is abnormally high Diabetes history: Type 2 DM Outpatient Diabetes medications: Farxiga 10 mg QD, Levemir 55 units QD, Novolog [id number] units TID, Trulicity 3 mg qwk Current orders for Inpatient glycemic control: Novolog 0-15 units TID  Inpatient Diabetes Program Recommendations:    Consider adding Semglee 16 units QD.   Thanks, Bronson Curb, MSN, RNC-OB Diabetes Coordinator [phone] (8a-5p)

## 2022-05-07 NOTE — TOC Transition Note (Signed)
Transition of Care [facility]) - CM/SW Discharge Note   Patient Details  Name: Joseph Ortega MRN: [id number] Date of Birth: 05-28-1955  [facility]) CM/SW Contact:  Pollie Friar, RN Phone Number: 05/07/2022, 4:21 PM   Clinical Narrative:    Pt is discharging to CIR today. CM signing off.    Final next level of care: IP Rehab Facility Barriers to Discharge: No Barriers Identified   Patient Goals and CMS Choice CMS Medicare.gov Compare Post Acute Care list provided to:: Patient Choice offered to / list presented to : Patient  Discharge Placement                         Discharge Plan and Services Additional resources added to the After Visit Summary for                                       Social Determinants of Health (SDOH) Interventions SDOH Screenings   Food Insecurity: No Food Insecurity (05/04/2022)  Housing: Low Risk  (05/04/2022)  Transportation Needs: No Transportation Needs (05/04/2022)  Utilities: Not At Risk (05/04/2022)  Depression (PHQ2-9): Low Risk  (02/28/2022)  Recent Concern: Depression (PHQ2-9) - Medium Risk (12/06/2021)  Tobacco Use: Low Risk  (05/02/2022)     Readmission Risk Interventions     No data to display

## 2022-05-07 NOTE — Progress Notes (Signed)
.  Inpatient Rehab Admissions Coordinator:    I continue to await insurance auth for CIR. Will continue to follow.   Clemens Catholic, [place], [place] Admissions Coordinator  [phone] ([place]) [phone] (office)

## 2022-05-07 NOTE — Progress Notes (Signed)
Inpatient Rehab Admissions Coordinator:     I have a CIR bed for this pt. RN may call report to [place], [place], [place] Admissions Coordinator  [phone] ([place]) [phone] (office)

## 2022-05-07 NOTE — Progress Notes (Addendum)
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  Admitted due to HA, CP, and found to have hemorrhage in the  right temporoparietal region with 25m L to R midline shift.    PT Comments    Pt making excellent progress towards his physical therapy goals and remains motivated to participate. Reports improved headache and diarrhea today. Focus of session on quality of gait, LLE coordination/control and functional mobility. Pt requiring minimal assist overall for transfers and ambulation. Suspect steady progress and neuro-recovery based on age, PLOF and motivation. Continue to recommend acute inpatient rehab ([facility]IR) for post-acute therapy needs.    Recommendations for follow up therapy are one component of a multi-disciplinary discharge planning process, led by the attending physician.  Recommendations may be updated based on patient status, additional functional criteria and insurance authorization.  Follow Up Recommendations  Acute inpatient rehab (3hours/day)     Assistance Recommended at Discharge Intermittent Supervision/Assistance  Patient can return home with the following A little help with walking and/or transfers;Assistance with cooking/housework;A little help with bathing/dressing/bathroom;Help with stairs or ramp for entrance   Equipment Recommendations  None recommended by PT    Recommendations for Other Services       Precautions / Restrictions Precautions Precautions: Fall Precaution Comments: L weakness; BP <150 Restrictions Weight Bearing Restrictions: No     Mobility  Bed Mobility               General bed mobility comments: OOB in chair    Transfers Overall transfer level: Needs assistance Equipment used: Rolling walker (2 wheels) Transfers: Sit  to/from Stand Sit to Stand: Min assist           General transfer comment: Cues for scooting out to edge of chair, pt placing L hand up on walker, R hand pushing up from armrest. MinA to boost    Ambulation/Gait Ambulation/Gait assistance: Min assist Gait Distance (Feet): 75 Feet Assistive device: Rolling walker (2 wheels) Gait Pattern/deviations: Decreased stride length, Decreased dorsiflexion - left, Wide base of support, Trunk flexed, Step-through pattern, Decreased step length - left Gait velocity: reduced Gait velocity interpretation: <1.31 ft/sec, indicative of household ambulator   General Gait Details: Verbal cueing for left heel strike and neutral L foot positioning   Stairs             Wheelchair Mobility    Modified Rankin (Stroke Patients Only) Modified Rankin (Stroke Patients Only) Pre-Morbid Rankin Score: Moderate disability Modified Rankin: Moderately severe disability     Balance Overall balance assessment: Needs assistance Sitting-balance support: Feet supported Sitting balance-Leahy Scale: Good     Standing balance support: Bilateral upper extremity supported, Reliant on assistive device for balance Standing balance-Leahy Scale: Poor Standing balance comment: UE reliance and physical assistance needed                            Cognition Arousal/Alertness: Awake/alert Behavior During Therapy: WFL for tasks assessed/performed, Flat affect Overall Cognitive Status: Impaired/Different from baseline Area of Impairment: Problem solving, Attention                   Current Attention Level: Selective         Problem Solving: Slow processing General Comments: Mildly slower processing  Exercises General Exercises - Lower Extremity Ankle Circles/Pumps: Left, 10 reps, Seated Long Arc Quad: Left, 10 reps, Seated (with visual target) Other Exercises Other Exercises: Standing at R railing in hallway: targeted stepping  with LLE, with emphasis on heel strike x 20 reps Other Exercises: Standing with BUE support on railing: lateral stepping to R/L, L gastroc stretch x 1 minute    General Comments        Pertinent Vitals/Pain Pain Assessment Pain Assessment: No/denies pain    Home Living                          Prior Function            PT Goals (current goals can now be found in the care plan section) Acute Rehab PT Goals Patient Stated Goal: to go to AIR then home PT Goal Formulation: With patient Time For Goal Achievement: 05/17/22 Potential to Achieve Goals: Good Progress towards PT goals: Progressing toward goals    Frequency    Min 3X/week      PT Plan Frequency needs to be updated    Co-evaluation              AM-PAC PT "6 Clicks" Mobility   Outcome Measure  Help needed turning from your back to your side while in a flat bed without using bedrails?: A Little Help needed moving from lying on your back to sitting on the side of a flat bed without using bedrails?: A Little Help needed moving to and from a bed to a chair (including a wheelchair)?: A Little Help needed standing up from a chair using your arms (e.g., wheelchair or bedside chair)?: A Little Help needed to walk in hospital room?: A Little Help needed climbing 3-5 steps with a railing? : A Lot 6 Click Score: 17    End of Session Equipment Utilized During Treatment: Gait belt Activity Tolerance: Patient tolerated treatment well Patient left: with call bell/phone within reach;in chair;with chair alarm set Nurse Communication: Mobility status PT Visit Diagnosis: Other abnormalities of gait and mobility (R26.89);Hemiplegia and hemiparesis;Unsteadiness on feet (R26.81);Muscle weakness (generalized) (M62.81);Difficulty in walking, not elsewhere classified (R26.2);Other symptoms and signs involving the nervous system (R29.898) Hemiplegia - Right/Left: Left Hemiplegia - dominant/non-dominant:  Non-dominant 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

## 2022-05-07 NOTE — H&P (Signed)
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  Per chart review patient lives with wife.  1 level home with ramped entrance.  Wife is reportedly limited mobility.  Presented 05/02/2022 with headache and nonspecific chest pain.  Cranial CT scan showed acute hemorrhagic infarct affecting the right posterior parietal lobe.  The region of infarction measuring 7.6 x 3.6 x 4.1 cm.  Old infarction of the right frontal parietal vertex and right occipital lobe.  CT angiogram head and neck no large vessel occlusion.  MRI redemonstrated infarction with hemorrhage in the right temporoparietal region.  Patient did not receive tPA.  Echocardiogram with ejection fraction of 50 to [id number] grade 1 diastolic dysfunction.  Neurology follow-up patient was placed on Cleviprex for blood pressure control.  Marland Kitchen  His aspirin was initially held and has since been resumed.  He was later cleared to begin subcutaneous heparin for DVT prophylaxis 05/05/2022.  Latest cranial CT scan 05/06/2022 unchanged no frank hematoma seen.  Awaiting plan for loop recorder.  Tolerating a regular consistency diet.  Therapy evaluations completed due to patient decreased functional mobility was admitted for a comprehensive rehab program.   Review of Systems  Constitutional:  Negative for chills and fever.  HENT:  Negative for hearing loss.   Eyes:  Negative for blurred vision and double vision.  Respiratory:  Negative for cough, shortness of breath and wheezing.   Cardiovascular:  Negative for chest pain, palpitations and leg swelling.  Gastrointestinal:  Positive for constipation. Negative for heartburn, nausea and vomiting.  Genitourinary:  Negative for dysuria,  flank pain and hematuria.  Musculoskeletal:  Positive for myalgias.  Skin:  Negative for rash.  Neurological:  Positive for weakness and headaches.  All other systems reviewed and are negative.       Past Medical History:  Diagnosis Date   Diabetes mellitus without complication ([place])     Hypertension     Thyroid disease           Past Surgical History:  Procedure Laterality Date   HERNIA REPAIR       IR ANGIO INTRA EXTRACRAN SEL COM CAROTID INNOMINATE BILAT MOD SED   03/09/2021   IR ANGIO VERTEBRAL SEL VERTEBRAL BILAT MOD SED   03/09/2021   IR US GUIDE VASC ACCESS RIGHT   03/09/2021         Family History  Problem Relation Age of Onset   Diabetes Father      Social History:  reports that he has never smoked. He has never used smokeless tobacco. He reports that he does not drink alcohol and does not use drugs. Allergies: No Known Allergies       Medications Prior to Admission  Medication Sig Dispense Refill   Acetaminophen (TYLENOL PO) Take 1 tablet by mouth daily as needed (pain, fever, headache).       aspirin EC 81 MG EC tablet Take 1 tablet (81 mg total) by mouth daily. Swallow whole. 30 tablet 11   dapagliflozin propanediol (FARXIGA) 10 MG TABS tablet Take 10 mg by mouth daily.       docusate sodium (COLACE) 100 MG capsule Take 1 capsule (100 mg total) by mouth 2 (two) times  daily as needed for mild constipation. 10 capsule 0   DULoxetine (CYMBALTA) 30 MG capsule Take 30 mg by mouth 2 (two) times daily.       furosemide (LASIX) 40 MG tablet Take 40 mg by mouth daily.       guanFACINE (INTUNIV) 2 MG TB24 ER tablet Take 2 mg by mouth at bedtime.       insulin detemir (LEVEMIR) 100 UNIT/ML FlexPen Inject 39 Units into the skin 2 (two) times daily. (Patient taking differently: Inject 55 Units into the skin daily.) 15 mL 11   levothyroxine (SYNTHROID, LEVOTHROID) 125 MCG tablet Take 125 mcg by mouth daily before breakfast.       loratadine (CLARITIN) 10 MG tablet Take 10 mg by  mouth at bedtime.       metoprolol tartrate (LOPRESSOR) 50 MG tablet Take 50 mg by mouth 2 (two) times daily.       NOVOLOG FLEXPEN 100 UNIT/ML FlexPen Inject 14 Units into the skin 3 (three) times daily with meals. (Patient taking differently: Inject 20-100 Units into the skin See admin instructions. Inject 20-100 units three times daily with meals per sliding scale.) 15 mL 11   potassium chloride SA (KLOR-CON M) 20 MEQ tablet Take 1 tablet (20 mEq total) by mouth 2 (two) times daily. (Patient taking differently: Take 20 mEq by mouth daily.) 60 tablet 0   rosuvastatin (CRESTOR) 40 MG tablet Take 40 mg by mouth daily.       traZODone (DESYREL) 50 MG tablet TAKE 1 TABLET BY MOUTH EVERYDAY AT BEDTIME (Patient taking differently: Take 50 mg by mouth at bedtime as needed for sleep.) 90 tablet 1   TRULICITY 3 0000000 SOPN Inject 3 mg into the skin every Friday.       valsartan (DIOVAN) 320 MG tablet Take 1 tablet (320 mg total) by mouth daily. 30 tablet 0   Vitamin D, Ergocalciferol, (DRISDOL) 50000 UNITS CAPS capsule Take 50,000 Units by mouth every Friday.       atorvastatin (LIPITOR) 40 MG tablet TAKE 1 TABLET BY MOUTH EVERY DAY (Patient not taking: Reported on 05/03/2022) 30 tablet 0   carvedilol (COREG) 12.5 MG tablet Take 1 tablet (12.5 mg total) by mouth 2 (two) times daily with a meal. (Patient not taking: Reported on 05/03/2022) 60 tablet 0   hydrochlorothiazide (HYDRODIURIL) 25 MG tablet TAKE 1 TABLET (25 MG TOTAL) BY MOUTH DAILY. (Patient not taking: Reported on 05/03/2022) 30 tablet 0          Home: [place] expects to be discharged to:: Private residence Living Arrangements: Spouse/significant other (Wife is wheelchair bound) Available Help at Discharge: Family, Available 24 hours/day Type of Home: House Home Access: Ramped entrance, Stairs to enter [employer] of Steps: ramp into the house then 2 steps with grab bar Home Layout: One level Bathroom  Shower/Tub: Multimedia programmer: Handicapped height Bathroom Accessibility: Yes Home Equipment: Conservation officer, nature (2 wheels), Shower seat, Grab bars - tub/shower, Grab bars - toilet, Rollator (4 wheels) Additional Comments: wife is in a wheelchair  Lives With: Spouse   Functional History: Prior Function Prior Level of Function : Needs assist, Independent/Modified Independent Mobility Comments: reports walked limited household distances only. Pt takes public transport to OP therapy appointments. ADLs Comments: Wife provides stand by assist for shower. Does not provide physical hands on assist.   Functional Status:  Mobility: Bed Mobility Overal bed mobility: Needs Assistance Bed Mobility: Supine to Sit Supine to sit: Min guard, HOB  elevated Sit to supine: Min assist, HOB elevated General bed mobility comments: min guard with trunk Transfers Overall transfer level: Needs assistance Equipment used: Rolling walker (2 wheels) Transfers: Sit to/from Stand Sit to Stand: Min assist General transfer comment: uses RUE only to power up, able to use LUE with RW Ambulation/Gait Ambulation/Gait assistance: Min assist Gait Distance (Feet): 75 Feet Assistive device: Rolling walker (2 wheels) Gait Pattern/deviations: Decreased stride length, Decreased dorsiflexion - left, Wide base of support, Trunk flexed, Step-through pattern, Decreased step length - left General Gait Details: Verbal cueing for left heel strike and neutral L foot positioning Gait velocity: reduced Gait velocity interpretation: <1.31 ft/sec, indicative of household ambulator   ADL: ADL Overall ADL's : Needs assistance/impaired Eating/Feeding: Set up, Sitting Eating/Feeding Details (indicate cue type and reason): assist to open containers as needed Grooming: Wash/dry hands, Wash/dry face, Oral care, Min guard, Standing Grooming Details (indicate cue type and reason): used LUE as gross assist Upper Body Bathing:  Minimal assistance, Standing Upper Body Bathing Details (indicate cue type and reason): assistance with RUE due to limited use of LUE Lower Body Bathing: Moderate assistance, Sit to/from stand Upper Body Dressing : Minimal assistance, Sitting Upper Body Dressing Details (indicate cue type and reason): donning hospital gown while seated EOB Lower Body Dressing: Maximal assistance, Sit to/from stand Lower Body Dressing Details (indicate cue type and reason): Assisted needed to don left hospital sock Toilet Transfer: Minimal assistance, Ambulation Toilet Transfer Details (indicate cue type and reason): simulated to recliner Toileting- Clothing Manipulation and Hygiene: Minimal assistance, Sitting/lateral lean General ADL Comments: able to use LUE for gross assist; hold toothbrush when applying toothpaste.   Cognition: Cognition Overall Cognitive Status: Impaired/Different from baseline Arousal/Alertness: Awake/alert Orientation Level: Oriented X4 Year: 2023 Month: February Day of Week: Correct Attention: Focused, Sustained Focused Attention: Appears intact Sustained Attention: Appears intact Memory: Impaired Memory Impairment:  (Immediate: 5/5 with repetition, delayed: 4/5 with cues: 1/1) Awareness: Appears intact Problem Solving: Impaired Problem Solving Impairment: Verbal complex (money: 0/3; time: 1/2) Executive Function: Sequencing, Technical brewer: Impaired Sequencing Impairment: Verbal complex (clock: 0/4) Organizing:  (backward digit span: 2/2) Cognition Arousal/Alertness: Awake/alert Behavior During Therapy: WFL for tasks assessed/performed, Flat affect Overall Cognitive Status: Impaired/Different from baseline Area of Impairment: Problem solving, Attention Current Attention Level: Selective Problem Solving: Slow processing General Comments: Mildly slower processing   Physical Exam: Blood pressure 139/67, pulse 63, temperature 97.9 F (36.6 C), temperature source  Oral, resp. rate 16, height 6' 1"$  (1.854 m), weight 112 kg, SpO2 96 %. Physical Exam Neurological:     Comments: Patient is alert.  No acute distress.  Oriented x 3 and follows commands.   General: No acute distress Mood and affect are appropriate Heart: Regular rate and rhythm no rubs murmurs or extra sounds Lungs: Clear to auscultation, breathing unlabored, no rales or wheezes Abdomen: Positive bowel sounds, soft nontender to palpation, nondistended Extremities: No clubbing, cyanosis, or edema Skin: No evidence of breakdown, no evidence of rash Neurologic: Cranial nerves II through XII intact, motor strength is 5/5 in RIght  deltoid, bicep, tricep, grip, hip flexor, knee extensors, ankle dorsiflexor and plantar flexor 3- left Delt Bi, tri, grip, 4- HF KE, ADF Sensory exam normal sensation to light touch  bilateral upper  Cerebellar exam normal on RIght and deferred due to weakness on left side  Musculoskeletal: Full range of motion in all 4 extremities. No joint swelling       Lab Results Last 48 Hours  Results for orders placed or performed during the hospital encounter of 05/02/22 (from the past 48 hour(s))  Glucose, capillary     Status: Abnormal    Collection Time: 05/05/22  4:16 PM  Result Value Ref Range    Glucose-Capillary 167 (H) 70 - 99 mg/dL      Comment: Glucose reference range applies only to samples taken after fasting for at least 8 hours.    Comment 1 Notify RN      Comment 2 Document in Chart    Glucose, capillary     Status: Abnormal    Collection Time: 05/05/22 10:00 PM  Result Value Ref Range    Glucose-Capillary 224 (H) 70 - 99 mg/dL      Comment: Glucose reference range applies only to samples taken after fasting for at least 8 hours.    Comment 1 Notify RN      Comment 2 Document in Chart    Basic metabolic panel     Status: Abnormal    Collection Time: 05/06/22  3:11 AM  Result Value Ref Range    Sodium 134 (L) 135 - 145 mmol/L    Potassium 3.5  3.5 - 5.1 mmol/L    Chloride 96 (L) 98 - 111 mmol/L    CO2 29 22 - 32 mmol/L    Glucose, Bld 183 (H) 70 - 99 mg/dL      Comment: Glucose reference range applies only to samples taken after fasting for at least 8 hours.    BUN 16 8 - 23 mg/dL    Creatinine, Ser 1.14 0.61 - 1.24 mg/dL    Calcium 9.0 8.9 - 10.3 mg/dL    GFR, Estimated >60 >60 mL/min      Comment: (NOTE) Calculated using the CKD-EPI Creatinine Equation (2021)      Anion gap 9 5 - 15      Comment: Performed at [facility] [address], [place] [address]  CBC     Status: None    Collection Time: 05/06/22  3:11 AM  Result Value Ref Range    WBC 6.2 4.0 - 10.5 K/uL    RBC 4.42 4.22 - 5.81 MIL/uL    Hemoglobin 13.9 13.0 - 17.0 g/dL    HCT 40.9 39.0 - 52.0 %    MCV 92.5 80.0 - 100.0 fL    MCH 31.4 26.0 - 34.0 pg    MCHC 34.0 30.0 - 36.0 g/dL    RDW 14.5 11.5 - 15.5 %    Platelets 186 150 - 400 K/uL    nRBC 0.0 0.0 - 0.2 %      Comment: Performed at [facility], [place] [address] [place], [place] [address]  Glucose, capillary     Status: Abnormal    Collection Time: 05/06/22  6:30 AM  Result Value Ref Range    Glucose-Capillary 194 (H) 70 - 99 mg/dL      Comment: Glucose reference range applies only to samples taken after fasting for at least 8 hours.    Comment 1 Notify RN      Comment 2 Document in Chart    Glucose, capillary     Status: Abnormal    Collection Time: 05/06/22 11:31 AM  Result Value Ref Range    Glucose-Capillary 271 (H) 70 - 99 mg/dL      Comment: Glucose reference range applies only to samples taken after fasting for at least 8 hours.    Comment 1 Notify  RN      Comment 2 Document in Chart    Glucose, capillary     Status: Abnormal    Collection Time: 05/06/22  4:23 PM  Result Value Ref Range    Glucose-Capillary 187 (H) 70 - 99 mg/dL      Comment: Glucose reference range applies only to samples taken after fasting for at least 8 hours.    Comment 1 Notify RN       Comment 2 Document in Chart    Glucose, capillary     Status: Abnormal    Collection Time: 05/06/22  9:21 PM  Result Value Ref Range    Glucose-Capillary 207 (H) 70 - 99 mg/dL      Comment: Glucose reference range applies only to samples taken after fasting for at least 8 hours.    Comment 1 Notify RN    Basic metabolic panel     Status: Abnormal    Collection Time: 05/07/22  3:54 AM  Result Value Ref Range    Sodium 136 135 - 145 mmol/L    Potassium 3.1 (L) 3.5 - 5.1 mmol/L    Chloride 98 98 - 111 mmol/L    CO2 25 22 - 32 mmol/L    Glucose, Bld 202 (H) 70 - 99 mg/dL      Comment: Glucose reference range applies only to samples taken after fasting for at least 8 hours.    BUN 18 8 - 23 mg/dL    Creatinine, Ser 1.14 0.61 - 1.24 mg/dL    Calcium 9.2 8.9 - 10.3 mg/dL    GFR, Estimated >60 >60 mL/min      Comment: (NOTE) Calculated using the CKD-EPI Creatinine Equation (2021)      Anion gap 13 5 - 15      Comment: Performed at [facility] [address], [place] [address]  CBC     Status: None    Collection Time: 05/07/22  3:54 AM  Result Value Ref Range    WBC 6.1 4.0 - 10.5 K/uL    RBC 4.55 4.22 - 5.81 MIL/uL    Hemoglobin 14.6 13.0 - 17.0 g/dL    HCT 41.4 39.0 - 52.0 %    MCV 91.0 80.0 - 100.0 fL    MCH 32.1 26.0 - 34.0 pg    MCHC 35.3 30.0 - 36.0 g/dL    RDW 14.6 11.5 - 15.5 %    Platelets 173 150 - 400 K/uL    nRBC 0.0 0.0 - 0.2 %      Comment: Performed at [facility], [place] [address], [place], [place] [address]  Glucose, capillary     Status: Abnormal    Collection Time: 05/07/22  6:05 AM  Result Value Ref Range    Glucose-Capillary 210 (H) 70 - 99 mg/dL      Comment: Glucose reference range applies only to samples taken after fasting for at least 8 hours.  Glucose, capillary     Status: Abnormal    Collection Time: 05/07/22 11:26 AM  Result Value Ref Range    Glucose-Capillary 228 (H) 70 - 99 mg/dL      Comment: Glucose reference range  applies only to samples taken after fasting for at least 8 hours.       Imaging Results (Last 48 hours)  CT HEAD WO CONTRAST (5MM)   Result Date: 05/06/2022 CLINICAL DATA:  Stroke, follow-up EXAM: CT HEAD WITHOUT CONTRAST TECHNIQUE: Contiguous axial images were obtained  from the base of the skull through the vertex without intravenous contrast. RADIATION DOSE REDUCTION: This exam was performed according to the departmental dose-optimization program which includes automated exposure control, adjustment of the mA and/or kV according to patient size and/or use of iterative reconstruction technique. COMPARISON:  05/02/2022 CT head FINDINGS: Brain: Redemonstrated infarction with petechial hemorrhage involving the right posterior parietal lobe, with the area of infarction measuring approximately 8.2 x 3.6 x 2.0 cm (AP x TR x CC) (series 4, image 18 and series 5, image 45), previously 8.1 x 3.6 x 3.9 cm when remeasured similarly, overall unchanged. No frank hematoma is seen. Overall unchanged degree of surrounding edema, without significant midline shift. Redemonstrated remote infarcts in the posterior right frontal lobe and right occipital lobe. No hydrocephalus or extra-axial collection. No evidence of additional acute infarct. Vascular: No hyperdense vessel. Atherosclerotic calcifications in the intracranial carotid and vertebral arteries. Skull: No acute fracture or focal lesion. Sinuses/Orbits: Clear paranasal sinuses. Status post bilateral lens replacements. Other: The mastoids are well aerated. IMPRESSION: 1. Overall unchanged size of a right parietal infarct with petechial hemorrhage. No frank hematoma is seen. Overall unchanged degree of surrounding edema, without significant midline shift. 2. Redemonstrated remote infarcts in the posterior right frontal lobe and right occipital lobe. Electronically Signed   By: Merilyn Baba M.D.   On: 05/06/2022 19:01           Blood pressure 139/67, pulse 63,  temperature 97.9 F (36.6 C), temperature source Oral, resp. rate 16, height 6' 1"$  (1.854 m), weight 112 kg, SpO2 96 %.   Medical Problem List and Plan: 1. 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             -patient may  shower             -ELOS/Goals: 10-12d Supervision 2.  Antithrombotics: -DVT/anticoagulation:  Pharmaceutical: Heparin             -antiplatelet therapy: Aspirin 81 mg daily resumed on 05/07/2022 3. Pain Management: Topamax 50 mg daily for headache 4. Mood/Behavior/Sleep: Provide emotional support             -antipsychotic agents: N/A 5. Neuropsych/cognition: This patient is capable of making decisions on his own behalf. 6. Skin/Wound Care: Routine skin checks 7. Fluids/Electrolytes/Nutrition: Routine in and outs with follow-up chemistries 8.  Hypothyroidism.  Synthroid 9.  Diabetes mellitus.  Hemoglobin A1c 8.3.  Currently on SSI.  Patient was on Levemir 39 units twice daily prior to admission as well as Farxiga 10 mg daily and Trulicity 3 mg every Thursday.  Resume as needed 10.  Permissive hypertension.  Coreg 12.5 mg twice daily, HCTZ 25 mg daily.  Monitor with increased mobility 11.  Hyperlipidemia.  Crestor 12.  Urinary incontinence  will try to wean off condom cath    Cathlyn Parsons, PA-C 05/07/2022  "I have personally performed a face to face diagnostic evaluation of this patient.  Additionally, I have reviewed and concur with the physician assistant's documentation above." Charlett Blake M.D. [facility] Fellow Am Acad of [employer] Med and Rehab Diplomate Am Board of Electrodiagnostic Med Fellow Am Board of Interventional Pain

## 2022-05-07 NOTE — Progress Notes (Signed)
Rounding Note    Patient Name: Joseph Ortega Date of Encounter: 05/07/2022  [facility] Cardiologist: Dr. Johnsie Cancel  Subjective   Looking forward to getting to rehab  Inpatient Medications    Scheduled Meds:  aspirin EC  81 mg Oral Daily   carvedilol  12.5 mg Oral BID WC   Chlorhexidine Gluconate Cloth  6 each Topical Q0600   DULoxetine  20 mg Oral BID   heparin injection (subcutaneous)  5,000 Units Subcutaneous Q8H   hydrochlorothiazide  25 mg Oral Daily   insulin aspart  0-15 Units Subcutaneous TID WC   levothyroxine  125 mcg Oral Q0600   mupirocin ointment  1 Application Nasal BID   pantoprazole  40 mg Oral QHS   rosuvastatin  40 mg Oral Daily   senna-docusate  1 tablet Oral BID   topiramate  50 mg Oral Daily   Continuous Infusions:  clevidipine Stopped (05/03/22 1451)   PRN Meds: acetaminophen **OR** acetaminophen (TYLENOL) oral liquid 160 mg/5 mL **OR** acetaminophen, hydrALAZINE, ondansetron, simethicone   Vital Signs    Vitals:   05/06/22 2342 05/07/22 0257 05/07/22 0951 05/07/22 1522  BP: (!) 151/72 (!) 149/73 139/67 126/62  Pulse: 65 62 63 65  Resp: 18 18 16 16  $ Temp: (!) 97.5 F (36.4 C) 97.6 F (36.4 C) 97.9 F (36.6 C) (!) 97.4 F (36.3 C)  TempSrc: Oral  Oral Oral  SpO2: 97% 99% 96% 94%  Weight:      Height:        Intake/Output Summary (Last 24 hours) at 05/07/2022 1616 Last data filed at 05/07/2022 1500 Gross per 24 hour  Intake --  Output 300 ml  Net -300 ml      05/04/2022    6:45 PM 12/06/2021    1:08 PM 08/23/2021    1:19 PM  Last 3 Weights  Weight (lbs) 246 lb 14.6 oz 261 lb 12.8 oz 258 lb  Weight (kg) 112 kg 118.752 kg 117.028 kg      Telemetry    Reviewed today, no AFib, has U waves - Personally Reviewed  ECG    No new EKGs - Personally Reviewed  Physical Exam   GEN: No acute distress.   Neck: No JVD Cardiac: RRR, no murmurs, rubs, or gallops.  Respiratory: CTA b/l. GI: Soft, nontender, non-distended  MS:  No edema; No deformity. Neuro:  deferred to neurology team Psych: Normal affect   Labs    High Sensitivity Troponin:   Recent Labs  Lab 05/02/22 1545 05/02/22 1941  TROPONINIHS 20* 22*     Chemistry Recent Labs  Lab 05/02/22 1845 05/05/22 0444 05/06/22 0311 05/07/22 0354  NA 137 135 134* 136  K 3.8 3.2* 3.5 3.1*  CL 100 98 96* 98  CO2 26 28 29 25  $ GLUCOSE 159* 180* 183* 202*  BUN 11 22 16 18  $ CREATININE 0.80 1.18 1.14 1.14  CALCIUM 9.1 8.8* 9.0 9.2  PROT 7.1  --   --   --   ALBUMIN 3.5  --   --   --   AST 27  --   --   --   ALT 15  --   --   --   ALKPHOS 98  --   --   --   BILITOT 1.3*  --   --   --   GFRNONAA >60 >60 >60 >60  ANIONGAP 11 9 9 13    $ Lipids  Recent Labs  Lab 05/05/22 0444  CHOL 85  TRIG 59  HDL 40*  LDLCALC 33  CHOLHDL 2.1    Hematology Recent Labs  Lab 05/05/22 0444 05/06/22 0311 05/07/22 0354  WBC 6.5 6.2 6.1  RBC 4.33 4.42 4.55  HGB 13.8 13.9 14.6  HCT 39.5 40.9 41.4  MCV 91.2 92.5 91.0  MCH 31.9 31.4 32.1  MCHC 34.9 34.0 35.3  RDW 14.5 14.5 14.6  PLT 177 186 173   Thyroid No results for input(s): "TSH", "FREET4" in the last 168 hours.  BNPNo results for input(s): "BNP", "PROBNP" in the last 168 hours.  DDimer No results for input(s): "DDIMER" in the last 168 hours.   Radiology    CT HEAD WO CONTRAST (5MM)  Result Date: 05/06/2022 CLINICAL DATA:  Stroke, follow-up EXAM: CT HEAD WITHOUT CONTRAST TECHNIQUE: Contiguous axial images were obtained from the base of the skull through the vertex without intravenous contrast. RADIATION DOSE REDUCTION: This exam was performed according to the departmental dose-optimization program which includes automated exposure control, adjustment of the mA and/or kV according to patient size and/or use of iterative reconstruction technique. COMPARISON:  05/02/2022 CT head FINDINGS: Brain: Redemonstrated infarction with petechial hemorrhage involving the right posterior parietal lobe, with the area of  infarction measuring approximately 8.2 x 3.6 x 2.0 cm (AP x TR x CC) (series 4, image 18 and series 5, image 45), previously 8.1 x 3.6 x 3.9 cm when remeasured similarly, overall unchanged. No frank hematoma is seen. Overall unchanged degree of surrounding edema, without significant midline shift. Redemonstrated remote infarcts in the posterior right frontal lobe and right occipital lobe. No hydrocephalus or extra-axial collection. No evidence of additional acute infarct. Vascular: No hyperdense vessel. Atherosclerotic calcifications in the intracranial carotid and vertebral arteries. Skull: No acute fracture or focal lesion. Sinuses/Orbits: Clear paranasal sinuses. Status post bilateral lens replacements. Other: The mastoids are well aerated. IMPRESSION: 1. Overall unchanged size of a right parietal infarct with petechial hemorrhage. No frank hematoma is seen. Overall unchanged degree of surrounding edema, without significant midline shift. 2. Redemonstrated remote infarcts in the posterior right frontal lobe and right occipital lobe. Electronically Signed   By: Merilyn Baba M.D.   On: 05/06/2022 19:01    Cardiac Studies   05/03/22: TTE  1. Hypokinesis / akinesis of the distal septal, distal inferior and  apical walls Overall unchanged from echo done in Dec 2022. Left  ventricular ejection fraction, by estimation, is 50 to 55%. The left  ventricle has low normal function. There is mild  left ventricular hypertrophy. Left ventricular diastolic parameters are  consistent with Grade I diastolic dysfunction (impaired relaxation).   2. Right ventricular systolic function is normal. The right ventricular  size is normal.   3. Left atrial size was moderately dilated.   4. A small pericardial effusion is present.   5. The mitral valve is normal in structure. Mild mitral valve  regurgitation.   6. The aortic valve is tricuspid. Aortic valve regurgitation is trivial.   Patient Profile     67 y.o. 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 Quentin Ore revisited with him today, re discussed implant procedure/potential risks/benefits, and the patient is agreeable  He is planned to discharge to CIR after  loop is implanted today  For questions or updates, please contact [facility] Please consult [email or web address] for contact info under        Signed, Baldwin [place], PA-C  05/07/2022, 4:16 PM

## 2022-05-07 NOTE — Progress Notes (Signed)
Occupational Therapy Treatment Patient Details Name: Joseph Ortega MRN: [id number] DOB: 1955/11/21 Today's Date: 05/07/2022   History of present illness Patient is a 67 y/o male with PMH DM, HTN, Thyroid disease and prior CVA 12/22 with residual L side weakness.  Admitted due to HA, CP, and found to have hemorrhage in the  right temporoparietal region with 34m L to R midline shift.   OT comments  Patient continues to demonstrate good progress with OT treatment. Patient able to stand at sink for grooming and UB bathing, using LUE as gross assist and was able to bathe RUE with LUE and assistance to complete. Patient was setup for self feeding and continues to be unable to use LUE to hold cup or utensil. Patient would benefit from continued OT treatment in AIR sitting to increase functional use of LUE to increase independence with bathing, dressing, and functional transfers.    Recommendations for follow up therapy are one component of a multi-disciplinary discharge planning process, led by the attending physician.  Recommendations may be updated based on patient status, additional functional criteria and insurance authorization.    Follow Up Recommendations  Acute inpatient rehab (3hours/day)     Assistance Recommended at Discharge Intermittent Supervision/Assistance  Patient can return home with the following  A little help with walking and/or transfers;A little help with bathing/dressing/bathroom;Assistance with cooking/housework;Help with stairs or ramp for entrance;Assist for transportation   Equipment Recommendations  Other (comment) (TBD)    Recommendations for Other Services      Precautions / Restrictions Precautions Precautions: Fall Precaution Comments: L weakness; BP <150 Restrictions Weight Bearing Restrictions: No       Mobility Bed Mobility Overal bed mobility: Needs Assistance Bed Mobility: Supine to Sit     Supine to sit: Min guard, HOB elevated     General bed  mobility comments: min guard with trunk    Transfers Overall transfer level: Needs assistance Equipment used: Rolling walker (2 wheels) Transfers: Sit to/from Stand Sit to Stand: Min assist           General transfer comment: uses RUE only to power up, able to use LUE with RW     Balance Overall balance assessment: Needs assistance Sitting-balance support: Feet supported Sitting balance-Leahy Scale: Good         Standing balance comment: UE reliance and physical assistance needed                           ADL either performed or assessed with clinical judgement   ADL Overall ADL's : Needs assistance/impaired Eating/Feeding: Set up;Sitting   Grooming: Wash/dry hands;Wash/dry face;Oral care;Min guard;Standing Grooming Details (indicate cue type and reason): used LUE as gross assist Upper Body Bathing: Minimal assistance;Standing Upper Body Bathing Details (indicate cue type and reason): assistance with RUE due to limited use of LUE                           General ADL Comments: able to use LUE for gross assist; hold toothbrush when applying toothpaste.    Extremity/Trunk Assessment Upper Extremity Assessment RUE Deficits / Details: Full A/ROM with 5/5 strength in all ranges.            Vision       Perception     Praxis      Cognition Arousal/Alertness: Awake/alert Behavior During Therapy: WFL for tasks assessed/performed, Flat affect Overall Cognitive Status: Impaired/Different from baseline  Area of Impairment: Problem solving, Attention                   Current Attention Level: Selective         Problem Solving: Slow processing          Exercises      Shoulder Instructions       General Comments VSS    Pertinent Vitals/ Pain       Pain Assessment Pain Assessment: No/denies pain  Home Living                                          Prior Functioning/Environment               Frequency  Min 2X/week        Progress Toward Goals  OT Goals(current goals can now be found in the care plan section)  Progress towards OT goals: Progressing toward goals  Acute Rehab OT Goals Patient Stated Goal: get better OT Goal Formulation: With patient Time For Goal Achievement: 05/18/22 Potential to Achieve Goals: Good ADL Goals Pt Will Perform Grooming: with modified independence;sitting Pt Will Perform Lower Body Dressing: with min guard assist Pt Will Transfer to Toilet: with supervision;ambulating Pt Will Perform Toileting - Clothing Manipulation and hygiene: with supervision;sitting/lateral leans Additional ADL Goal #1: pt will demonstrate improved LUE coordination while being able to grasp and release a drinking glass/cup without physical assist.  Plan Discharge plan remains appropriate    Co-evaluation                 AM-PAC OT "6 Clicks" Daily Activity     Outcome Measure   Help from another person eating meals?: A Little Help from another person taking care of personal grooming?: A Little Help from another person toileting, which includes using toliet, bedpan, or urinal?: A Little Help from another person bathing (including washing, rinsing, drying)?: A Little Help from another person to put on and taking off regular upper body clothing?: A Little Help from another person to put on and taking off regular lower body clothing?: A Lot 6 Click Score: 17    End of Session Equipment Utilized During Treatment: Gait belt;Rolling walker (2 wheels)  OT Visit Diagnosis: Unsteadiness on feet (R26.81);Muscle weakness (generalized) (M62.81);Other symptoms and signs involving the nervous system (R29.898);Hemiplegia and hemiparesis Hemiplegia - Right/Left: Right Hemiplegia - dominant/non-dominant: Non-Dominant Hemiplegia - caused by: Nontraumatic intracerebral hemorrhage   Activity Tolerance Patient tolerated treatment well   Patient Left in chair;with  call bell/phone within reach;with chair alarm set   Nurse Communication Mobility status        Time: [id number] OT Time Calculation (min): 27 min  Charges: OT General Charges $OT Visit: 1 Visit OT Treatments $Self Care/Home Management : 23-37 mins  Lodema Hong, [place]  Office [place] 05/07/2022, 9:27 AM

## 2022-05-07 NOTE — Progress Notes (Signed)
Awaiting insurance auth for CIR.  ToC following.

## 2022-05-07 NOTE — Plan of Care (Signed)
  Problem: Education: Goal: Knowledge of disease or condition will improve Outcome: Progressing Goal: Knowledge of secondary prevention will improve (MUST DOCUMENT ALL) Outcome: Progressing Goal: Knowledge of patient specific risk factors will improve [place]ta [place] N/A or DELETE if not current risk factor) Outcome: Progressing   Problem: Intracerebral Hemorrhage Tissue Perfusion: Goal: Complications of Intracerebral Hemorrhage will be minimized Outcome: Progressing   Problem: Coping: Goal: Will verbalize positive feelings about self Outcome: Progressing   Problem: Health Behavior/Discharge Planning: Goal: Ability to manage health-related needs will improve Outcome: Progressing   Problem: Self-Care: Goal: Ability to participate in self-care as condition permits will improve Outcome: Progressing Goal: Verbalization of feelings and concerns over difficulty with self-care will improve Outcome: Progressing   Problem: Nutrition: Goal: Dietary intake will improve Outcome: Progressing   Problem: Education: Goal: Individualized Educational Video(s) Outcome: Progressing   Problem: Fluid Volume: Goal: Ability to maintain a balanced intake and output will improve Outcome: Progressing   Problem: Health Behavior/Discharge Planning: Goal: Ability to identify and utilize available resources and services will improve Outcome: Progressing Goal: Ability to manage health-related needs will improve Outcome: Progressing   Problem: Metabolic: Goal: Ability to maintain appropriate glucose levels will improve Outcome: Progressing   Problem: Nutritional: Goal: Maintenance of adequate nutrition will improve Outcome: Progressing Goal: Progress toward achieving an optimal weight will improve Outcome: Progressing   Problem: Skin Integrity: Goal: Risk for impaired skin integrity will decrease Outcome: Progressing   Problem: Tissue Perfusion: Goal: Adequacy of tissue perfusion will  improve Outcome: Progressing   Problem: Education: Goal: Knowledge of General Education information will improve Description: Including pain rating scale, medication(s)/side effects and non-pharmacologic comfort measures Outcome: Progressing   Problem: Health Behavior/Discharge Planning: Goal: Ability to manage health-related needs will improve Outcome: Progressing   Problem: Clinical Measurements: Goal: Ability to maintain clinical measurements within normal limits will improve Outcome: Progressing Goal: Will remain free from infection Outcome: Progressing Goal: Diagnostic test results will improve Outcome: Progressing Goal: Cardiovascular complication will be avoided Outcome: Progressing   Problem: Activity: Goal: Risk for activity intolerance will decrease Outcome: Progressing   Problem: Pain Managment: Goal: General experience of comfort will improve Outcome: Progressing   Problem: Safety: Goal: Ability to remain free from injury will improve Outcome: Progressing   Problem: Skin Integrity: Goal: Risk for impaired skin integrity will decrease Outcome: Progressing   Problem: Coping: Goal: Will identify appropriate support needs Outcome: Completed/Met   Problem: Health Behavior/Discharge Planning: Goal: Goals will be collaboratively established with patient/family Outcome: Completed/Met   Problem: Self-Care: Goal: Ability to communicate needs accurately will improve Outcome: Completed/Met   Problem: Nutrition: Goal: Risk of aspiration will decrease Outcome: Completed/Met   Problem: Education: Goal: Ability to describe self-care measures that may prevent or decrease complications (Diabetes Survival Skills Education) will improve Outcome: Completed/Met   Problem: Coping: Goal: Ability to adjust to condition or change in health will improve Outcome: Completed/Met   Problem: Nutrition: Goal: Adequate nutrition will be maintained Outcome: Completed/Met    Problem: Coping: Goal: Level of anxiety will decrease Outcome: Completed/Met   Problem: Elimination: Goal: Will not experience complications related to bowel motility Outcome: Completed/Met Goal: Will not experience complications related to urinary retention Outcome: Completed/Met   Problem: Clinical Measurements: Goal: Respiratory complications will improve Outcome: Not Applicable

## 2022-05-07 NOTE — Progress Notes (Signed)
Mobility Specialist Progress Note   05/07/22 1230  Mobility  Activity Ambulated with assistance in hallway;Ambulated with assistance to bathroom  Level of Assistance Contact guard assist, steadying assist  Assistive Device Front wheel walker  Distance Ambulated (ft) 120 ft  Range of Motion/Exercises Active;All extremities  Activity Response Tolerated well   Patient received in recliner, eager to participate.Marland Kitchen Requires mod A to stand and to descend from standing. Ambulated close min guard for balance with slow steady step-to gait . During ambulation patient required multi-modal cues to increase LLE step length. Successfully stepped over blue diamonds in hallway with verbal cues to help with foot clearance. Returned to room without complaint or incident. Was left in bathroom for BM with all needs met, call bell in reach.   [place] Keyandre Pileggi, BS EXP Mobility Specialist Please contact via SecureChat or Rehab office at [phone]

## 2022-05-07 NOTE — Discharge Instructions (Signed)
Post procedure wound care instructions Keep incision clean and dry for 3 days. You can remove outer dressing tomorrow. Leave steri-strips (little pieces of tape) on until seen in the office for wound check appointment. Call the office ([phone]) for redness, drainage, swelling, or fever.  

## 2022-05-07 NOTE — Progress Notes (Deleted)
Patient ID: Joseph Ortega, male   DOB: 1955/04/15, 67 y.o.   MRN: [id number] Met with the patient and significant other to review current situation, rehab process, team conference and plan of care. Discussed secondary risks including HTN, HLD, prediabetes and smoking. Reviewed medications including DAPT x 3 weeks then Plavix solo, and dietary modification recommendations with smoking cessation. Continue to follow along to address educational needs to facilitate preparation for discharge. Margarito Liner

## 2022-05-07 NOTE — H&P (Incomplete)
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  Per chart review patient lives with wife.  1 level home with ramped entrance.  Wife is reportedly limited mobility.  Presented 05/02/2022 with headache and nonspecific chest pain.  Cranial CT scan showed acute hemorrhagic infarct affecting the right posterior parietal lobe.  The region of infarction measuring 7.6 x 3.6 x 4.1 cm.  Old infarction of the right frontal parietal vertex and right occipital lobe.  CT angiogram head and neck no large vessel occlusion.  MRI redemonstrated infarction with hemorrhage in the right temporoparietal region.  Patient did not receive tPA.  Echocardiogram with ejection fraction of 50 to [id number] grade 1 diastolic dysfunction.  Neurology follow-up patient was placed on Cleviprex for blood pressure control.  Marland Kitchen  His aspirin was initially held and has since been resumed.  He was later cleared to begin subcutaneous heparin for DVT prophylaxis 05/05/2022.  Latest cranial CT scan 05/06/2022 unchanged no frank hematoma seen.  Awaiting plan for loop recorder.  Tolerating a regular consistency diet.  Therapy evaluations completed due to patient decreased functional mobility was admitted for a comprehensive rehab program.  Review of Systems  Constitutional:  Negative for chills and fever.  HENT:  Negative for hearing loss.   Eyes:  Negative for blurred vision and double vision.  Respiratory:  Negative for cough, shortness of breath and wheezing.   Cardiovascular:  Negative for chest pain, palpitations and leg swelling.  Gastrointestinal:  Positive for constipation. Negative for heartburn, nausea and vomiting.  Genitourinary:  Negative for dysuria,  flank pain and hematuria.  Musculoskeletal:  Positive for myalgias.  Skin:  Negative for rash.  Neurological:  Positive for weakness and headaches.  All other systems reviewed and are negative.  Past Medical History:  Diagnosis Date   Diabetes mellitus without complication ([place])    Hypertension    Thyroid disease    Past Surgical History:  Procedure Laterality Date   HERNIA REPAIR     IR ANGIO INTRA EXTRACRAN SEL COM CAROTID INNOMINATE BILAT MOD SED  03/09/2021   IR ANGIO VERTEBRAL SEL VERTEBRAL BILAT MOD SED  03/09/2021   IR US GUIDE VASC ACCESS RIGHT  03/09/2021   Family History  Problem Relation Age of Onset   Diabetes Father    Social History:  reports that he has never smoked. He has never used smokeless tobacco. He reports that he does not drink alcohol and does not use drugs. Allergies: No Known Allergies Medications Prior to Admission  Medication Sig Dispense Refill   Acetaminophen (TYLENOL PO) Take 1 tablet by mouth daily as needed (pain, fever, headache).     aspirin EC 81 MG EC tablet Take 1 tablet (81 mg total) by mouth daily. Swallow whole. 30 tablet 11   dapagliflozin propanediol (FARXIGA) 10 MG TABS tablet Take 10 mg by mouth daily.     docusate sodium (COLACE) 100 MG capsule Take 1 capsule (100 mg total) by mouth 2 (two) times daily as needed for mild constipation. 10 capsule 0   DULoxetine (CYMBALTA) 30 MG capsule Take 30 mg by mouth 2 (two) times daily.     furosemide (LASIX) 40 MG tablet Take 40 mg by mouth daily.  guanFACINE (INTUNIV) 2 MG TB24 ER tablet Take 2 mg by mouth at bedtime.     insulin detemir (LEVEMIR) 100 UNIT/ML FlexPen Inject 39 Units into the skin 2 (two) times daily. (Patient taking differently: Inject 55 Units into the skin daily.) 15 mL 11   levothyroxine (SYNTHROID, LEVOTHROID) 125 MCG tablet Take 125 mcg by mouth daily before breakfast.     loratadine (CLARITIN) 10 MG tablet Take 10 mg by mouth at bedtime.     metoprolol tartrate  (LOPRESSOR) 50 MG tablet Take 50 mg by mouth 2 (two) times daily.     NOVOLOG FLEXPEN 100 UNIT/ML FlexPen Inject 14 Units into the skin 3 (three) times daily with meals. (Patient taking differently: Inject 20-100 Units into the skin See admin instructions. Inject 20-100 units three times daily with meals per sliding scale.) 15 mL 11   potassium chloride SA (KLOR-CON M) 20 MEQ tablet Take 1 tablet (20 mEq total) by mouth 2 (two) times daily. (Patient taking differently: Take 20 mEq by mouth daily.) 60 tablet 0   rosuvastatin (CRESTOR) 40 MG tablet Take 40 mg by mouth daily.     traZODone (DESYREL) 50 MG tablet TAKE 1 TABLET BY MOUTH EVERYDAY AT BEDTIME (Patient taking differently: Take 50 mg by mouth at bedtime as needed for sleep.) 90 tablet 1   TRULICITY 3 0000000 SOPN Inject 3 mg into the skin every Friday.     valsartan (DIOVAN) 320 MG tablet Take 1 tablet (320 mg total) by mouth daily. 30 tablet 0   Vitamin D, Ergocalciferol, (DRISDOL) 50000 UNITS CAPS capsule Take 50,000 Units by mouth every Friday.     atorvastatin (LIPITOR) 40 MG tablet TAKE 1 TABLET BY MOUTH EVERY DAY (Patient not taking: Reported on 05/03/2022) 30 tablet 0   carvedilol (COREG) 12.5 MG tablet Take 1 tablet (12.5 mg total) by mouth 2 (two) times daily with a meal. (Patient not taking: Reported on 05/03/2022) 60 tablet 0   hydrochlorothiazide (HYDRODIURIL) 25 MG tablet TAKE 1 TABLET (25 MG TOTAL) BY MOUTH DAILY. (Patient not taking: Reported on 05/03/2022) 30 tablet 0      Home: [place] expects to be discharged to:: Private residence Living Arrangements: Spouse/significant other (Wife is wheelchair bound) Available Help at Discharge: Family, Available 24 hours/day Type of Home: House Home Access: Ramped entrance, Stairs to enter [employer] of Steps: ramp into the house then 2 steps with grab bar Home Layout: One level Bathroom Shower/Tub: Multimedia programmer: Handicapped  height Bathroom Accessibility: Yes Home Equipment: Conservation officer, nature (2 wheels), Shower seat, Grab bars - tub/shower, Grab bars - toilet, Rollator (4 wheels) Additional Comments: wife is in a wheelchair  Lives With: Spouse   Functional History: Prior Function Prior Level of Function : Needs assist, Independent/Modified Independent Mobility Comments: reports walked limited household distances only. Pt takes public transport to OP therapy appointments. ADLs Comments: Wife provides stand by assist for shower. Does not provide physical hands on assist.  Functional Status:  Mobility: Bed Mobility Overal bed mobility: Needs Assistance Bed Mobility: Supine to Sit Supine to sit: Min guard, HOB elevated Sit to supine: Min assist, HOB elevated General bed mobility comments: min guard with trunk Transfers Overall transfer level: Needs assistance Equipment used: Rolling walker (2 wheels) Transfers: Sit to/from Stand Sit to Stand: Min assist General transfer comment: uses RUE only to power up, able to use LUE with RW Ambulation/Gait Ambulation/Gait assistance: Min assist Gait Distance (Feet): 75 Feet Assistive device: Rolling walker (2  wheels) Gait Pattern/deviations: Decreased stride length, Decreased dorsiflexion - left, Wide base of support, Trunk flexed, Step-through pattern, Decreased step length - left General Gait Details: Verbal cueing for left heel strike and neutral L foot positioning Gait velocity: reduced Gait velocity interpretation: <1.31 ft/sec, indicative of household ambulator    ADL: ADL Overall ADL's : Needs assistance/impaired Eating/Feeding: Set up, Sitting Eating/Feeding Details (indicate cue type and reason): assist to open containers as needed Grooming: Wash/dry hands, Wash/dry face, Oral care, Min guard, Standing Grooming Details (indicate cue type and reason): used LUE as gross assist Upper Body Bathing: Minimal assistance, Standing Upper Body Bathing Details  (indicate cue type and reason): assistance with RUE due to limited use of LUE Lower Body Bathing: Moderate assistance, Sit to/from stand Upper Body Dressing : Minimal assistance, Sitting Upper Body Dressing Details (indicate cue type and reason): donning hospital gown while seated EOB Lower Body Dressing: Maximal assistance, Sit to/from stand Lower Body Dressing Details (indicate cue type and reason): Assisted needed to don left hospital sock Toilet Transfer: Minimal assistance, Ambulation Toilet Transfer Details (indicate cue type and reason): simulated to recliner Toileting- Clothing Manipulation and Hygiene: Minimal assistance, Sitting/lateral lean General ADL Comments: able to use LUE for gross assist; hold toothbrush when applying toothpaste.  Cognition: Cognition Overall Cognitive Status: Impaired/Different from baseline Arousal/Alertness: Awake/alert Orientation Level: Oriented X4 Year: 2023 Month: February Day of Week: Correct Attention: Focused, Sustained Focused Attention: Appears intact Sustained Attention: Appears intact Memory: Impaired Memory Impairment:  (Immediate: 5/5 with repetition, delayed: 4/5 with cues: 1/1) Awareness: Appears intact Problem Solving: Impaired Problem Solving Impairment: Verbal complex (money: 0/3; time: 1/2) Executive Function: Sequencing, Technical brewer: Impaired Sequencing Impairment: Verbal complex (clock: 0/4) Organizing:  (backward digit span: 2/2) Cognition Arousal/Alertness: Awake/alert Behavior During Therapy: WFL for tasks assessed/performed, Flat affect Overall Cognitive Status: Impaired/Different from baseline Area of Impairment: Problem solving, Attention Current Attention Level: Selective Problem Solving: Slow processing General Comments: Mildly slower processing  Physical Exam: Blood pressure 139/67, pulse 63, temperature 97.9 F (36.6 C), temperature source Oral, resp. rate 16, height 6' 1"$  (1.854 m), weight 112  kg, SpO2 96 %. Physical Exam Neurological:     Comments: Patient is alert.  No acute distress.  Oriented x 3 and follows commands.     Results for orders placed or performed during the hospital encounter of 05/02/22 (from the past 48 hour(s))  Glucose, capillary     Status: Abnormal   Collection Time: 05/05/22  4:16 PM  Result Value Ref Range   Glucose-Capillary 167 (H) 70 - 99 mg/dL    Comment: Glucose reference range applies only to samples taken after fasting for at least 8 hours.   Comment 1 Notify RN    Comment 2 Document in Chart   Glucose, capillary     Status: Abnormal   Collection Time: 05/05/22 10:00 PM  Result Value Ref Range   Glucose-Capillary 224 (H) 70 - 99 mg/dL    Comment: Glucose reference range applies only to samples taken after fasting for at least 8 hours.   Comment 1 Notify RN    Comment 2 Document in Chart   Basic metabolic panel     Status: Abnormal   Collection Time: 05/06/22  3:11 AM  Result Value Ref Range   Sodium 134 (L) 135 - 145 mmol/L   Potassium 3.5 3.5 - 5.1 mmol/L   Chloride 96 (L) 98 - 111 mmol/L   CO2 29 22 - 32 mmol/L   Glucose, Bld 183 (  H) 70 - 99 mg/dL    Comment: Glucose reference range applies only to samples taken after fasting for at least 8 hours.   BUN 16 8 - 23 mg/dL   Creatinine, Ser 1.14 0.61 - 1.24 mg/dL   Calcium 9.0 8.9 - 10.3 mg/dL   GFR, Estimated >60 >60 mL/min    Comment: (NOTE) Calculated using the CKD-EPI Creatinine Equation (2021)    Anion gap 9 5 - 15    Comment: Performed at [facility] [address] [place] [address]  CBC     Status: None   Collection Time: 05/06/22  3:11 AM  Result Value Ref Range   WBC 6.2 4.0 - 10.5 K/uL   RBC 4.42 4.22 - 5.81 MIL/uL   Hemoglobin 13.9 13.0 - 17.0 g/dL   HCT 40.9 39.0 - 52.0 %   MCV 92.5 80.0 - 100.0 fL   MCH 31.4 26.0 - 34.0 pg   MCHC 34.0 30.0 - 36.0 g/dL   RDW 14.5 11.5 - 15.5 %   Platelets 186 150 - 400 K/uL   nRBC 0.0 0.0 - 0.2 %    Comment:  Performed at [facility], [place] [address], [place], [place] [address]  Glucose, capillary     Status: Abnormal   Collection Time: 05/06/22  6:30 AM  Result Value Ref Range   Glucose-Capillary 194 (H) 70 - 99 mg/dL    Comment: Glucose reference range applies only to samples taken after fasting for at least 8 hours.   Comment 1 Notify RN    Comment 2 Document in Chart   Glucose, capillary     Status: Abnormal   Collection Time: 05/06/22 11:31 AM  Result Value Ref Range   Glucose-Capillary 271 (H) 70 - 99 mg/dL    Comment: Glucose reference range applies only to samples taken after fasting for at least 8 hours.   Comment 1 Notify RN    Comment 2 Document in Chart   Glucose, capillary     Status: Abnormal   Collection Time: 05/06/22  4:23 PM  Result Value Ref Range   Glucose-Capillary 187 (H) 70 - 99 mg/dL    Comment: Glucose reference range applies only to samples taken after fasting for at least 8 hours.   Comment 1 Notify RN    Comment 2 Document in Chart   Glucose, capillary     Status: Abnormal   Collection Time: 05/06/22  9:21 PM  Result Value Ref Range   Glucose-Capillary 207 (H) 70 - 99 mg/dL    Comment: Glucose reference range applies only to samples taken after fasting for at least 8 hours.   Comment 1 Notify RN   Basic metabolic panel     Status: Abnormal   Collection Time: 05/07/22  3:54 AM  Result Value Ref Range   Sodium 136 135 - 145 mmol/L   Potassium 3.1 (L) 3.5 - 5.1 mmol/L   Chloride 98 98 - 111 mmol/L   CO2 25 22 - 32 mmol/L   Glucose, Bld 202 (H) 70 - 99 mg/dL    Comment: Glucose reference range applies only to samples taken after fasting for at least 8 hours.   BUN 18 8 - 23 mg/dL   Creatinine, Ser 1.14 0.61 - 1.24 mg/dL   Calcium 9.2 8.9 - 10.3 mg/dL   GFR, Estimated >60 >60 mL/min    Comment: (NOTE) Calculated using the CKD-EPI Creatinine Equation (2021)    Anion gap 13 5 -  15    Comment: Performed at [facility], [place] [address],  [place] [address]  CBC     Status: None   Collection Time: 05/07/22  3:54 AM  Result Value Ref Range   WBC 6.1 4.0 - 10.5 K/uL   RBC 4.55 4.22 - 5.81 MIL/uL   Hemoglobin 14.6 13.0 - 17.0 g/dL   HCT 41.4 39.0 - 52.0 %   MCV 91.0 80.0 - 100.0 fL   MCH 32.1 26.0 - 34.0 pg   MCHC 35.3 30.0 - 36.0 g/dL   RDW 14.6 11.5 - 15.5 %   Platelets 173 150 - 400 K/uL   nRBC 0.0 0.0 - 0.2 %    Comment: Performed at [facility], [place] [address], [place], [place] [address]  Glucose, capillary     Status: Abnormal   Collection Time: 05/07/22  6:05 AM  Result Value Ref Range   Glucose-Capillary 210 (H) 70 - 99 mg/dL    Comment: Glucose reference range applies only to samples taken after fasting for at least 8 hours.  Glucose, capillary     Status: Abnormal   Collection Time: 05/07/22 11:26 AM  Result Value Ref Range   Glucose-Capillary 228 (H) 70 - 99 mg/dL    Comment: Glucose reference range applies only to samples taken after fasting for at least 8 hours.   CT HEAD WO CONTRAST (5MM)  Result Date: 05/06/2022 CLINICAL DATA:  Stroke, follow-up EXAM: CT HEAD WITHOUT CONTRAST TECHNIQUE: Contiguous axial images were obtained from the base of the skull through the vertex without intravenous contrast. RADIATION DOSE REDUCTION: This exam was performed according to the departmental dose-optimization program which includes automated exposure control, adjustment of the mA and/or kV according to patient size and/or use of iterative reconstruction technique. COMPARISON:  05/02/2022 CT head FINDINGS: Brain: Redemonstrated infarction with petechial hemorrhage involving the right posterior parietal lobe, with the area of infarction measuring approximately 8.2 x 3.6 x 2.0 cm (AP x TR x CC) (series 4, image 18 and series 5, image 45), previously 8.1 x 3.6 x 3.9 cm when remeasured similarly, overall unchanged. No frank hematoma is seen. Overall unchanged degree of surrounding edema, without significant midline shift.  Redemonstrated remote infarcts in the posterior right frontal lobe and right occipital lobe. No hydrocephalus or extra-axial collection. No evidence of additional acute infarct. Vascular: No hyperdense vessel. Atherosclerotic calcifications in the intracranial carotid and vertebral arteries. Skull: No acute fracture or focal lesion. Sinuses/Orbits: Clear paranasal sinuses. Status post bilateral lens replacements. Other: The mastoids are well aerated. IMPRESSION: 1. Overall unchanged size of a right parietal infarct with petechial hemorrhage. No frank hematoma is seen. Overall unchanged degree of surrounding edema, without significant midline shift. 2. Redemonstrated remote infarcts in the posterior right frontal lobe and right occipital lobe. Electronically Signed   By: Merilyn Baba M.D.   On: 05/06/2022 19:01      Blood pressure 139/67, pulse 63, temperature 97.9 F (36.6 C), temperature source Oral, resp. rate 16, height 6' 1"$  (1.854 m), weight 112 kg, SpO2 96 %.  Medical Problem List and Plan: 1. 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  -patient may *** shower  -ELOS/Goals: *** 2.  Antithrombotics: -DVT/anticoagulation:  Pharmaceutical: Heparin  -antiplatelet therapy: Aspirin 81 mg daily resumed on 05/07/2022 3. Pain Management: Topamax 50 mg daily for headache 4. Mood/Behavior/Sleep: Provide emotional support  -antipsychotic agents: N/A 5. Neuropsych/cognition: This patient is capable of making decisions on  his own behalf. 6. Skin/Wound Care: Routine skin checks 7. Fluids/Electrolytes/Nutrition: Routine in and outs with follow-up chemistries 8.  Hypothyroidism.  Synthroid 9.  Diabetes mellitus.  Hemoglobin A1c 8.3.  Currently on SSI.  Patient was on Levemir 39 units twice daily prior to admission as well as Farxiga 10 mg daily and Trulicity 3 mg every Thursday.  Resume as needed 10.  Permissive hypertension.  Coreg 12.5 mg twice  daily, HCTZ 25 mg daily.  Monitor with increased mobility 11.  Hyperlipidemia.  Crestor   Lavon Paganini Abigaile Rossie, PA-C 05/07/2022

## 2022-05-08 ENCOUNTER — Encounter: Payer: BC Managed Care – PPO | Admitting: Occupational Therapy

## 2022-05-08 ENCOUNTER — Encounter (HOSPITAL_COMMUNITY): Payer: Self-pay | Admitting: Cardiology

## 2022-05-08 ENCOUNTER — Ambulatory Visit: Payer: BC Managed Care – PPO | Admitting: Physical Therapy

## 2022-05-08 DIAGNOSIS — I63511 Cerebral infarction due to unspecified occlusion or stenosis of right middle cerebral artery: Secondary | ICD-10-CM | POA: Diagnosis not present

## 2022-05-08 LAB — COMPREHENSIVE METABOLIC PANEL
ALT: 12 U/L (ref 0–44)
AST: 19 U/L (ref 15–41)
Albumin: 3 g/dL — ABNORMAL LOW (ref 3.5–5.0)
Alkaline Phosphatase: 85 U/L (ref 38–126)
Anion gap: 8 (ref 5–15)
BUN: 17 mg/dL (ref 8–23)
CO2: 30 mmol/L (ref 22–32)
Calcium: 9 mg/dL (ref 8.9–10.3)
Chloride: 98 mmol/L (ref 98–111)
Creatinine, Ser: 1.14 mg/dL (ref 0.61–1.24)
GFR, Estimated: >60 mL/min (ref >60)
Glucose, Bld: 244 mg/dL — ABNORMAL HIGH (ref 70–99)
Potassium: 3.3 mmol/L — ABNORMAL LOW (ref 3.5–5.1)
Sodium: 136 mmol/L (ref 135–145)
Total Bilirubin: 1.1 mg/dL (ref 0.3–1.2)
Total Protein: 6.6 g/dL (ref 6.5–8.1)

## 2022-05-08 LAB — GLUCOSE, CAPILLARY
Glucose-Capillary: 240 mg/dL — ABNORMAL HIGH (ref 70–99)
Glucose-Capillary: 235 mg/dL — ABNORMAL HIGH (ref 70–99)
Glucose-Capillary: 244 mg/dL — ABNORMAL HIGH (ref 70–99)
Glucose-Capillary: 280 mg/dL — ABNORMAL HIGH (ref 70–99)

## 2022-05-08 LAB — CBC WITH DIFFERENTIAL/PLATELET
Abs Immature Granulocytes: 0.02 10*3/uL (ref 0.00–0.07)
Basophils Absolute: 0 10*3/uL (ref 0.0–0.1)
Basophils Relative: 1 %
Eosinophils Absolute: 0.2 10*3/uL (ref 0.0–0.5)
Eosinophils Relative: 3 %
HCT: 39.3 % (ref 39.0–52.0)
Hemoglobin: 13.4 g/dL (ref 13.0–17.0)
Immature Granulocytes: 0 %
Lymphocytes Relative: 16 %
Lymphs Abs: 1 10*3/uL (ref 0.7–4.0)
MCH: 31.7 pg (ref 26.0–34.0)
MCHC: 34.1 g/dL (ref 30.0–36.0)
MCV: 92.9 fL (ref 80.0–100.0)
Monocytes Absolute: 0.6 10*3/uL (ref 0.1–1.0)
Monocytes Relative: 10 %
Neutro Abs: 4.3 10*3/uL (ref 1.7–7.7)
Neutrophils Relative %: 70 %
Platelets: 181 10*3/uL (ref 150–400)
RBC: 4.23 MIL/uL (ref 4.22–5.81)
RDW: 14.6 % (ref 11.5–15.5)
WBC: 6.1 10*3/uL (ref 4.0–10.5)
nRBC: 0 % (ref 0.0–0.2)

## 2022-05-08 MED ORDER — INSULIN DETEMIR 100 UNIT/ML ~~LOC~~ SOLN
15.0000 [IU] | Freq: Every day | SUBCUTANEOUS | Status: DC
  Administered 2022-05-08: 15 [IU] via SUBCUTANEOUS
  Filled 2022-05-08 (×2): qty 0.15

## 2022-05-08 MED ORDER — ENOXAPARIN SODIUM 40 MG/0.4ML IJ SOSY
40.0000 mg | PREFILLED_SYRINGE | INTRAMUSCULAR | Status: DC
  Administered 2022-05-08 – 2022-05-29 (×22): 40 mg via SUBCUTANEOUS
  Filled 2022-05-08 (×22): qty 0.4

## 2022-05-08 NOTE — Progress Notes (Signed)
Inpatient Rehabilitation  Patient information reviewed and entered into eRehab system by Vennie Waymire Telissa Palmisano, OTR/L, Rehab Quality Coordinator.   Information including medical coding, functional ability and quality indicators will be reviewed and updated through discharge.   

## 2022-05-08 NOTE — Progress Notes (Signed)
PROGRESS NOTE   Subjective/Complaints:  No issues overnite  Pt working with OT on Left visual field deficit  ROS- denies CP, SOB, N/V/D Objective:   EP PPM/ICD IMPLANT  Result Date: 05/07/2022 CONCLUSIONS:  1. Successful implantation of a implantable loop recorder for Cryptogenic stroke  2. No early apparent complications.   CT HEAD WO CONTRAST (5MM)  Result Date: 05/06/2022 CLINICAL DATA:  Stroke, follow-up EXAM: CT HEAD WITHOUT CONTRAST TECHNIQUE: Contiguous axial images were obtained from the base of the skull through the vertex without intravenous contrast. RADIATION DOSE REDUCTION: This exam was performed according to the departmental dose-optimization program which includes automated exposure control, adjustment of the mA and/or kV according to patient size and/or use of iterative reconstruction technique. COMPARISON:  05/02/2022 CT head FINDINGS: Brain: Redemonstrated infarction with petechial hemorrhage involving the right posterior parietal lobe, with the area of infarction measuring approximately 8.2 x 3.6 x 2.0 cm (AP x TR x CC) (series 4, image 18 and series 5, image 45), previously 8.1 x 3.6 x 3.9 cm when remeasured similarly, overall unchanged. No frank hematoma is seen. Overall unchanged degree of surrounding edema, without significant midline shift. Redemonstrated remote infarcts in the posterior right frontal lobe and right occipital lobe. No hydrocephalus or extra-axial collection. No evidence of additional acute infarct. Vascular: No hyperdense vessel. Atherosclerotic calcifications in the intracranial carotid and vertebral arteries. Skull: No acute fracture or focal lesion. Sinuses/Orbits: Clear paranasal sinuses. Status post bilateral lens replacements. Other: The mastoids are well aerated. IMPRESSION: 1. Overall unchanged size of a right parietal infarct with petechial hemorrhage. No frank hematoma is seen. Overall  unchanged degree of surrounding edema, without significant midline shift. 2. Redemonstrated remote infarcts in the posterior right frontal lobe and right occipital lobe. Electronically Signed   By: Merilyn Baba M.D.   On: 05/06/2022 19:01   Recent Labs    05/07/22 2223 05/08/22 0536  WBC 8.1 6.1  HGB 15.4 13.4  HCT 43.7 39.3  PLT 216 181   Recent Labs    05/07/22 0354 05/07/22 2223 05/08/22 0536  NA 136  --  136  K 3.1*  --  3.3*  CL 98  --  98  CO2 25  --  30  GLUCOSE 202*  --  244*  BUN 18  --  17  CREATININE 1.14 1.36* 1.14  CALCIUM 9.2  --  9.0    Intake/Output Summary (Last 24 hours) at 05/08/2022 0910 Last data filed at 05/08/2022 0900 Gross per 24 hour  Intake 240 ml  Output 1200 ml  Net -960 ml        Physical Exam: Vital Signs Blood pressure (!) 156/72, pulse 65, temperature 98.7 F (37.1 C), temperature source Oral, resp. rate 18, weight 110.2 kg, SpO2 97 %.   General: No acute distress Mood and affect are appropriate Heart: Regular rate and rhythm no rubs murmurs or extra sounds Lungs: Clear to auscultation, breathing unlabored, no rales or wheezes Abdomen: Positive bowel sounds, soft nontender to palpation, nondistended Extremities: No clubbing, cyanosis, or edema Skin: No evidence of breakdown, no evidence of rash Neurologic: Cranial nerves II through XII intact, motor strength is 5/5 in right  and 3- left deltoid, bicep, tricep, grip, hip flexor, knee extensors, ankle dorsiflexor and plantar flexor Sensory exam normal sensation to light touch and proprioception in bilateral upper and lower extremities Cerebellar exam deferred due to weakness  Musculoskeletal: Full range of motion in all 4 extremities. No joint swelling   Assessment/Plan: 1. Functional deficits which require 3+ hours per day of interdisciplinary therapy in a comprehensive inpatient rehab setting. Physiatrist is providing close team supervision and 24 hour management of active medical  problems listed below. Physiatrist and rehab team continue to assess barriers to discharge/monitor patient progress toward functional and medical goals  Care Tool:  Bathing              Bathing assist       Upper Body Dressing/Undressing Upper body dressing        Upper body assist      Lower Body Dressing/Undressing Lower body dressing            Lower body assist       Toileting Toileting    Toileting assist       Transfers Chair/bed transfer  Transfers assist           Locomotion Ambulation   Ambulation assist              Walk 10 feet activity   Assist           Walk 50 feet activity   Assist           Walk 150 feet activity   Assist           Walk 10 feet on uneven surface  activity   Assist           Wheelchair     Assist               Wheelchair 50 feet with 2 turns activity    Assist            Wheelchair 150 feet activity     Assist          Blood pressure (!) 156/72, pulse 65, temperature 98.7 F (37.1 C), temperature source Oral, resp. rate 18, weight 110.2 kg, SpO2 97 %.  Medical Problem List and Plan: 1. 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             -patient may  shower             -ELOS/Goals: 10-12d Supervision 2.  Antithrombotics: -DVT/anticoagulation:  Pharmaceutical: Heparin- switch to lovenox , no renal disease              -antiplatelet therapy: Aspirin 81 mg daily resumed on 05/07/2022 3. Pain Management: Topamax 50 mg daily for headache 4. Mood/Behavior/Sleep: Provide emotional support             -antipsychotic agents: N/A 5. Neuropsych/cognition: This patient is capable of making decisions on his own behalf. 6. Skin/Wound Care: Routine skin checks 7. Fluids/Electrolytes/Nutrition: Routine in and outs with follow-up chemistries 8.  Hypothyroidism.  Synthroid 9.  Diabetes  mellitus.  Hemoglobin A1c 8.3.  Currently on SSI.  Patient was on Levemir 39 units twice daily prior to admission as well as Farxiga 10 mg daily and Trulicity 3 mg every Thursday.  Resume as needed\ CBG (last 3)  Recent Labs    05/07/22 1616 05/07/22 2103 05/08/22 0554  GLUCAP 216* 271* 240*  Restart levimir, 15U qhs 2/20  10.  Permissive hypertension.  Coreg 12.5 mg twice daily, HCTZ 25 mg daily.  Monitor with increased mobility Vitals:   05/08/22 0853  BP: (!) 156/72  Pulse: 65  Resp: 18  Temp: 98.7 F (37.1 C)  SpO2: 97%    11.  Hyperlipidemia.  Crestor 12.  Urinary incontinence  will try to wean off condom cath     LOS: 1 days A FACE TO FACE EVALUATION WAS PERFORMED  Charlett Blake 05/08/2022, 9:10 AM

## 2022-05-08 NOTE — Evaluation (Signed)
Physical Therapy Assessment and Plan  Patient Details  Name: Joseph Ortega MRN: [id number] Date of Birth: [birth year]  PT Diagnosis: Abnormality of gait, Difficulty walking, Hemiparesis non-dominant, Hypertonia, and Muscle weakness Rehab Potential: Good ELOS: 12-14 days   Today's Date: 05/08/2022 PT Individual Time: 1050-1203 PT Individual Time Calculation (min): 52 min    Hospital Problem: Principal Problem:   Right middle cerebral artery stroke [facility])   Past Medical History:  Past Medical History:  Diagnosis Date   Diabetes mellitus without complication ([place])    Hypertension    Thyroid disease    Past Surgical History:  Past Surgical History:  Procedure Laterality Date   HERNIA REPAIR     IR ANGIO INTRA EXTRACRAN SEL COM CAROTID INNOMINATE BILAT MOD SED  03/09/2021   IR ANGIO VERTEBRAL SEL VERTEBRAL BILAT MOD SED  03/09/2021   IR US GUIDE VASC ACCESS RIGHT  03/09/2021   LOOP RECORDER INSERTION N/A 05/07/2022   Procedure: LOOP RECORDER INSERTION;  Surgeon: Vickie Epley, MD;  Location: [facility];  Service: Cardiovascular;  Laterality: N/A;    Assessment & Plan Clinical Impression: Patient is a 67 y.o. right-handed male with history of hypertension as well as diabetes mellitus as well as 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  Per chart review patient lives with wife.  1 level home with ramped entrance.  Wife is reportedly limited mobility.  Presented 05/02/2022 with headache and nonspecific chest pain.  Cranial CT scan showed acute hemorrhagic infarct affecting the right posterior parietal lobe.  The region of infarction measuring 7.6 x 3.6 x 4.1 cm.  Old infarction of the right frontal parietal vertex and right occipital lobe.  CT angiogram head and neck no large vessel occlusion.  MRI redemonstrated infarction with hemorrhage in the right temporoparietal region.  Patient did not receive tPA.  Echocardiogram with  ejection fraction of 50 to [id number] grade 1 diastolic dysfunction.  Neurology follow-up patient was placed on Cleviprex for blood pressure control.  Marland Kitchen  His aspirin was initially held and has since been resumed.  He was later cleared to begin subcutaneous heparin for DVT prophylaxis 05/05/2022.  Latest cranial CT scan 05/06/2022 unchanged no frank hematoma seen.  Awaiting plan for loop recorder.  Tolerating a regular consistency diet.  Therapy evaluations completed due to patient decreased functional mobility was admitted for a comprehensive rehab program .  Patient transferred to CIR on 05/07/2022 .   Patient currently requires mod assist with mobility secondary to muscle weakness, decreased cardiorespiratoy endurance, impaired timing and sequencing, unbalanced muscle activation, and decreased coordination, field cut and hemianopsia, decreased motor planning, decreased initiation and decreased memory, and decreased standing balance, hemiplegia, and decreased balance strategies.  Prior to hospitalization, patient was modified independent  with mobility and lived with Spouse in a House home.  Home access is ramp into the house then 2 steps with grab barRamped entrance, Stairs to enter.  Patient will benefit from skilled PT intervention to maximize safe functional mobility, minimize fall risk, and decrease caregiver burden for planned discharge home with 24 hour supervision.  Anticipate patient will benefit from follow up OP at discharge.  PT - End of Session Activity Tolerance: Tolerates 30+ min activity with multiple rests Endurance Deficit: Yes PT Assessment Rehab Potential (ACUTE/IP ONLY): Good PT Barriers to Discharge: [place] home environment;Decreased caregiver support;Home environment access/layout;Lack of/limited family support;Insurance for SNF coverage PT Patient demonstrates impairments in the following area(s): Balance;Endurance;Motor;Perception;Safety PT Transfers Functional Problem(s):  Bed  Mobility;Bed to Chair;Furniture PT Locomotion Functional Problem(s): Ambulation;Stairs PT Plan PT Intensity: Minimum of 1-2 x/day ,45 to 90 minutes PT Frequency: 5 out of 7 days PT Duration Estimated Length of Stay: 12-14 days PT Treatment/Interventions: Ambulation/gait training;Community reintegration;DME/adaptive equipment instruction;Neuromuscular re-education;Psychosocial support;Stair training;UE/LE Strength taining/ROM;Wheelchair propulsion/positioning;UE/LE Coordination activities;Therapeutic Activities;Skin care/wound management;Pain management;Discharge planning;Balance/vestibular training;Cognitive remediation/compensation;Disease management/prevention;Functional mobility training;Patient/family education;Splinting/orthotics;Therapeutic Exercise;Visual/perceptual remediation/compensation PT Transfers Anticipated Outcome(s): supervision/ CGA PT Locomotion Anticipated Outcome(s): supervision/ CGA PT Recommendation Recommendations for Other Services: Therapeutic Recreation consult Therapeutic Recreation Interventions: Stress management;Kitchen group;Outing/community reintergration Follow Up Recommendations: Outpatient PT;Home health PT Patient destination: Home Equipment Recommended: To be determined   PT Evaluation Precautions/Restrictions Precautions Precautions: Fall Precaution Comments: L sided weakness; BP <150 Restrictions Weight Bearing Restrictions: No General   Vital SignsTherapy Vitals Temp: 98.3 F (36.8 C) Temp Source: Oral Pulse Rate: 66 Resp: 18 BP: 131/66 Patient Position (if appropriate): Sitting Oxygen Therapy SpO2: 96 % O2 Device: Room Air Pain Pain Assessment Pain Scale: 0-10 Pain Score: 0-No pain Pain Interference Pain Interference Pain Effect on Sleep: 1. Rarely or not at all Pain Interference with Therapy Activities: 1. Rarely or not at all Pain Interference with Day-to-Day Activities: 1. Rarely or not at all Home Living/Prior  [place] Available Help at Discharge: Family;Available 24 hours/day Type of Home: House Home Access: Ramped entrance;Stairs to enter Entrance Stairs-Number of Steps: ramp into the house then 2 steps with grab bar Home Layout: One level Bathroom Shower/Tub: Multimedia programmer: Handicapped height Bathroom Accessibility: Yes Additional Comments: wife uses trasport chair for mobility in the home  Lives With: Spouse Prior Function Level of Independence: Independent with basic ADLs;Independent with gait;Independent with transfers;Requires assistive device for independence  Able to Take Stairs?: Yes Driving: No Vocation: On disability Vision/Perception  Vision - History Ability to See in Adequate Light: 0 Adequate Vision - Assessment Eye Alignment: Within Functional Limits Ocular Range of Motion: Within Functional Limits Alignment/Gaze Preference: Within Defined Limits Saccades: Additional eye shifts occurred during testing Convergence: Within functional limits Perception Perception: Within Functional Limits Praxis Praxis: Intact  Cognition Overall Cognitive Status: Impaired/Different from baseline Arousal/Alertness: Awake/alert Orientation Level: Oriented X4 Year: 2024 Month: February Day of Week: Correct Attention: Focused;Sustained Focused Attention: Appears intact Sustained Attention: Appears intact Memory: Impaired Memory Impairment: Decreased recall of new information;Retrieval deficit Awareness: Appears intact Problem Solving: Impaired Problem Solving Impairment: Verbal complex Safety/Judgment: Appears intact Sensation Sensation Light Touch: Appears Intact Coordination Gross Motor Movements are Fluid and Coordinated: No Fine Motor Movements are Fluid and Coordinated: No Coordination and Movement Description: RLE WFL; LLE with decreased initiation and smoothness of movement Heel Shin Test: slow to initiate but completes with LLE; [facility] for  RLE Motor  Motor Motor: Other (comment) (L hemipareisis) Motor - Skilled Clinical Observations: LLE with decreased initiation and smoothness of movement   Trunk/Postural Assessment  Cervical Assessment Cervical Assessment: Exceptions to [facility] (forward head) Thoracic Assessment Thoracic Assessment: Exceptions to [facility] (rounded shoulders) Lumbar Assessment Lumbar Assessment: Within Functional Limits Postural Control Postural Control: Deficits on evaluation (minimal trunkal dyscoordination with initiation of gait; resolves over short distance)  Balance Balance Balance Assessed: Yes Static Sitting Balance Static Sitting - Balance Support: Feet supported Static Sitting - Level of Assistance: 5: Stand by assistance Dynamic Sitting Balance Dynamic Sitting - Balance Support: Feet supported Dynamic Sitting - Level of Assistance: 5: Stand by assistance Dynamic Sitting - Balance Activities: Forward lean/weight shifting;Reaching for weighted objects;Reaching for objects Static Standing Balance Static Standing - Balance Support: During  functional activity;Right upper extremity supported Static Standing - Level of Assistance: 4: Min assist Dynamic Standing Balance Dynamic Standing - Balance Support: During functional activity;Right upper extremity supported Dynamic Standing - Level of Assistance: 3: Mod assist;4: Min assist Extremity Assessment      RLE Assessment RLE Assessment: Exceptions to [facility] RLE Strength Right Hip Flexion: 4+/5 Right Hip Extension: 4+/5 Right Hip ABduction: 4+/5 Right Hip ADduction: 4/5 Right Knee Flexion: 4+/5 Right Knee Extension: 5/5 Right Ankle Dorsiflexion: 4/5 Right Ankle Plantar Flexion: 4/5 LLE Assessment LLE Assessment: Exceptions to [facility] LLE Strength Left Hip Flexion: 4-/5 Left Hip Extension: 4-/5 Left Hip ABduction: 4-/5 Left Hip ADduction: 3+/5 Left Knee Flexion: 3+/5 Left Knee Extension: 4-/5 Left Ankle Dorsiflexion: 3-/5 Left Ankle Plantar  Flexion: 3-/5  Care Tool Care Tool Bed Mobility Roll left and right activity   Roll left and right assist level: Contact Guard/Touching assist    Sit to lying activity   Sit to lying assist level: Supervision/Verbal cueing    Lying to sitting on side of bed activity   Lying to sitting on side of bed assist level: the ability to move from lying on the back to sitting on the side of the bed with no back support.: Contact Guard/Touching assist     Care Tool Transfers Sit to stand transfer   Sit to stand assist level: Moderate Assistance - Patient 50 - 74%    Chair/bed transfer   Chair/bed transfer assist level: Moderate Assistance - Patient 50 - 74%     Toilet transfer   Assist Level: Moderate Assistance - Patient 50 - 74%    Car transfer   Car transfer assist level: Moderate Assistance - Patient 50 - 74%      Care Tool Locomotion Ambulation   Assist level: Minimal Assistance - Patient > 75% Assistive device: Walker-rolling Max distance: 200 ft  Walk 10 feet activity   Assist level: Moderate Assistance - Patient - 50 - 74% Assistive device: No Device   Walk 50 feet with 2 turns activity   Assist level: Minimal Assistance - Patient > 75% Assistive device: Walker-rolling  Walk 150 feet activity   Assist level: Minimal Assistance - Patient > 75% Assistive device: Walker-rolling  Walk 10 feet on uneven surfaces activity Walk 10 feet on uneven surfaces activity did not occur: Safety/medical concerns      Stairs   Assist level: Minimal Assistance - Patient > 75% Stairs assistive device: 1 hand rail Max number of stairs: 4  Walk up/down 1 step activity   Walk up/down 1 step (curb) assist level: Minimal Assistance - Patient > 75% Walk up/down 1 step or curb assistive device: 1 hand rail  Walk up/down 4 steps activity   Walk up/down 4 steps assist level: Minimal Assistance - Patient > 75% Walk up/down 4 steps assistive device: 1 hand rail  Walk up/down 12 steps activity  Walk up/down 12 steps activity did not occur: Safety/medical concerns      Pick up small objects from floor   Pick up small object from the floor assist level: Minimal Assistance - Patient > 75%    Wheelchair Is the patient using a wheelchair?: Yes Type of Wheelchair: Manual   Wheelchair assist level: Maximal Assistance - Patient 25 - 49% Max wheelchair distance: 60 ft  Wheel 50 feet with 2 turns activity   Assist Level: Maximal Assistance - Patient 25 - 49%  Wheel 150 feet activity   Assist Level: Total Assistance - Patient < 25%  Refer to Care Plan for Long Term Goals  SHORT TERM GOAL WEEK 1 PT Short Term Goal 1 (Week 1): Pt will perform sit<>stand with CGA using RW. PT Short Term Goal 2 (Week 1): pt will perform stand pivot to/ from recliner with CGA/ MinA using RW PT Short Term Goal 3 (Week 1): Pt will perform dynamic standing/ balance activities with no AD and MinA. PT Short Term Goal 4 (Week 1): pt will ambulate longer distances using RW and improved LLE foot clearance. PT Short Term Goal 5 (Week 1): Pt will demonstrate decreased hip hike during backward step with LLE.  Recommendations for other services: Surveyor, mining group, Stress management, and Outing/community reintegration  Skilled Therapeutic Intervention Mobility Bed Mobility Bed Mobility: Supine to Sit;Sit to Supine Supine to Sit: Contact Guard/Touching assist Sit to Supine: Supervision/Verbal cueing Transfers Transfers: Sit to Stand;Stand to Sit;Stand Pivot Transfers Sit to Stand: Moderate Assistance - Patient 50-74% Stand to Sit: Minimal Assistance - Patient > 75% Stand Pivot Transfers: Moderate Assistance - Patient 50 - 74% Stand Pivot Transfer Details: Verbal cues for technique;Verbal cues for gait pattern;Verbal cues for safe use of DME/AE Transfer (Assistive device): Rolling walker (L hand saddle splint) Locomotion  Gait Ambulation: Yes Gait Assistance: Minimal Assistance - Patient  > 75%;Contact Guard/Touching assist Gait Distance (Feet): 200 Feet Assistive device: Rolling walker Gait Assistance Details: Verbal cues for technique;Verbal cues for gait pattern;Verbal cues for safe use of DME/AE Gait Gait: Yes Gait Pattern: Impaired Gait Pattern: Step-through pattern;Decreased step length - left;Decreased stance time - left;Decreased hip/knee flexion - left Gait velocity: reduced Stairs / Additional Locomotion Stairs: Yes Stairs Assistance: Minimal Assistance - Patient > 75% Stair Management Technique: One rail Right Number of Stairs: 4 Height of Stairs: 6 Wheelchair Mobility Wheelchair Mobility: No  Skilled Intervention: PT Evaluation completed; see above for results. PT educated patient in roles of PT vs OT, PT POC, rehab potential, rehab goals, and discharge recommendations along with recommendation for follow-up rehabilitation services. Individual treatment initiated:  Patient seated upright in recliner upon PT arrival. Patient alert and agreeable to PT session. No pain complaint during session.  Therapeutic Activity: Transfers: Patient performed sit <> stand transfer from recliner requiring [place] for power up. Provided vc/tc for forward lean and weight shift, increased effort  Gait Training:  Patient ambulated >200 feet using RW with L hand saddle splint with CGA/ int MinA for balance. Ambulated with decreased L foot clearance, intermittent hip hike with decreased hip/ knee flexion. Provided vc/tc for increasing L step height/ length.   Stairs completed using RHR and step-to pattern to ascend. Pt prefers to descend leading with RLE. With switch to LLE first, pt demos LLE adduction which limits space for RLE to descend resulting in NBOS. Guard provided to LLE during lowering to step.   Neuromuscular Re-ed: NMR facilitated during session with focus on sitting/ standing balance. Pt guided in sit<>stand training using education re: need for forward weight shift  through hip hinge and decreased trunk flexion. Improvement noted in need for physical assist in power up and improves to CGA. Trunk control challenged with resistance and perturbations in sitting and standing. Good balance noted in sitting with slight decrease in standing d/t marked use of RLE over LLE. NMR performed for improvements in motor control and coordination, balance, sequencing, judgement, and self confidence/ efficacy in performing all aspects of mobility at highest level of independence.   Patient seated upright in recliner at end of session with brakes locked,  belt alarm set, and all needs within reach.   Discharge Criteria: Patient will be discharged from PT if patient refuses treatment 3 consecutive times without medical reason, if treatment goals not met, if there is a change in medical status, if patient makes no progress towards goals or if patient is discharged from hospital.  The above assessment, treatment plan, treatment alternatives and goals were discussed and mutually agreed upon: by patient  Alger Simons 05/08/2022, 5:29 PM

## 2022-05-08 NOTE — Progress Notes (Signed)
Inpatient Rehabilitation Admission Medication Review by a Pharmacist  A complete drug regimen review was completed for this patient to identify any potential clinically significant medication issues.  High Risk Drug Classes Is patient taking? Indication by Medication  Antipsychotic No   Anticoagulant Yes Lovenox- VTE ppx  Antibiotic No   Opioid No   Antiplatelet Yes Aspirin- Stroke ppx  Hypoglycemics/insulin Yes Levemir, novolog- diabetes  Vasoactive Medication Yes HCTZ, carvedilol- hypertension  Chemotherapy No   Other Yes Crestor- stroke ppx, hyperlipidemia Duloxetine-pain and mood Levothyroxine- hypothyroid Protonix- Reflux Topamax-headache Senokot- bowel regimen     Type of Medication Issue Identified Description of Issue Recommendation(s)  Drug Interaction(s) (clinically significant)     Duplicate Therapy     Allergy     No Medication Administration End Date     Incorrect Dose     Additional Drug Therapy Needed     Significant med changes from prior encounter (inform family/care partners about these prior to discharge). Wilder Glade- restart as appropriate Trulicity- no recent fill history- restart if appropriate Communicate medication changes with patient/family at discharge  Other       Clinically significant medication issues were identified that warrant physician communication and completion of prescribed/recommended actions by midnight of the next day:  No  Time spent performing this drug regimen review (minutes): 40  Thank you for allowing [place] to participate in this patients care. Jens Som, PharmD 05/08/2022 11:15 AM  **Pharmacist phone directory can be found on [email or web address] listed under [place]**

## 2022-05-08 NOTE — Evaluation (Signed)
Occupational Therapy Assessment and Plan  Patient Details  Name: Joseph Ortega MRN: [id number] Date of Birth: [birth year]  OT Diagnosis: abnormal posture, disturbance of vision, hemiplegia affecting non-dominant side, and muscle weakness (generalized) Rehab Potential: Rehab Potential (ACUTE ONLY): Good ELOS: 2 weeks   Today's Date: 05/08/2022 OT Individual Time: [id number] OT Individual Time Calculation (min): 75 min     Hospital Problem: Principal Problem:   Right middle cerebral artery stroke [facility])   Past Medical History:  Past Medical History:  Diagnosis Date   Diabetes mellitus without complication ([place])    Hypertension    Thyroid disease    Past Surgical History:  Past Surgical History:  Procedure Laterality Date   HERNIA REPAIR     IR ANGIO INTRA EXTRACRAN SEL COM CAROTID INNOMINATE BILAT MOD SED  03/09/2021   IR ANGIO VERTEBRAL SEL VERTEBRAL BILAT MOD SED  03/09/2021   IR US GUIDE VASC ACCESS RIGHT  03/09/2021   LOOP RECORDER INSERTION N/A 05/07/2022   Procedure: LOOP RECORDER INSERTION;  Surgeon: Vickie Epley, MD;  Location: [place] [facility];  Service: Cardiovascular;  Laterality: N/A;    Assessment & Plan Clinical Impression:[name] is a 67 year old right-handed male with history of hypertension as well as diabetes mellitus as well as 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  Per chart review patient lives with wife.  1 level home with ramped entrance.  Wife is reportedly limited mobility.  Presented 05/02/2022 with headache and nonspecific chest pain.  Cranial CT scan showed acute hemorrhagic infarct affecting the right posterior parietal lobe.  The region of infarction measuring 7.6 x 3.6 x 4.1 cm.  Old infarction of the right frontal parietal vertex and right occipital lobe.  CT angiogram head and neck no large vessel occlusion.  MRI redemonstrated infarction with hemorrhage in the right temporoparietal  region.  Patient did not receive tPA.  Echocardiogram with ejection fraction of 50 to [id number] grade 1 diastolic dysfunction.  Neurology follow-up patient was placed on Cleviprex for blood pressure control.  Marland Kitchen  His aspirin was initially held and has since been resumed.  He was later cleared to begin subcutaneous heparin for DVT prophylaxis 05/05/2022.  Latest cranial CT scan 05/06/2022 unchanged no frank hematoma seen.  Awaiting plan for loop recorder.  Tolerating a regular consistency diet.  Therapy evaluations completed due to patient decreased functional mobility was admitted for a comprehensive rehab program.    Patient currently requires max with basic self-care skills and IADL secondary to muscle weakness, muscle joint tightness, and muscle paralysis, decreased cardiorespiratoy endurance, impaired timing and sequencing, abnormal tone, unbalanced muscle activation, motor apraxia, ataxia, decreased coordination, and decreased motor planning, decreased attention to left, decreased problem solving and decreased memory, and decreased standing balance, decreased postural control, hemiplegia, and decreased balance strategies.  Prior to hospitalization, patient could complete with mod I incl short dist driving, outpt PT and OT @ [facility] and took transportation and with RW/rollator.  Patient will benefit from skilled intervention to decrease level of assist with basic self-care skills, increase independence with basic self-care skills, and increase level of independence with iADL prior to discharge home with care partner.  Anticipate patient will require 24 hour supervision and minimal physical assistance and follow up outpatient.  OT - End of Session Activity Tolerance: Tolerates 10 - 20 min activity with multiple rests Endurance Deficit: Yes OT Assessment Rehab Potential (ACUTE ONLY): Good OT Barriers to Discharge: Decreased caregiver support;Home environment access/layout;Wound Care  OT Barriers to Discharge  Comments: wife in w/c, 2 steps to enter OT Patient demonstrates impairments in the following area(s): Balance;Endurance;Perception;Vision;Motor;Safety;Cognition;Sensory;Skin Integrity OT Basic ADL's Functional Problem(s): Grooming;Eating;Bathing;Dressing;Toileting OT Advanced ADL's Functional Problem(s): Simple Meal Preparation;Laundry;Light Housekeeping OT Transfers Functional Problem(s): Toilet;Tub/Shower OT Additional Impairment(s): Fuctional Use of Upper Extremity OT Plan OT Intensity: Minimum of 1-2 x/day, 45 to 90 minutes OT Frequency: 5 out of 7 days OT Duration/Estimated Length of Stay: 2 weeks OT Treatment/Interventions: Balance/vestibular training;Community reintegration;Disease mangement/prevention;Neuromuscular re-education;Patient/family education;Self Care/advanced ADL retraining;Splinting/orthotics;Therapeutic Exercise;UE/LE Coordination activities;Wheelchair propulsion/positioning;Cognitive remediation/compensation;Discharge planning;DME/adaptive equipment instruction;Functional mobility training;Psychosocial support;Skin care/wound managment;Therapeutic Activities;UE/LE Strength taining/ROM;Visual/perceptual remediation/compensation OT Self Feeding Anticipated Outcome(s): mod I OT Basic Self-Care Anticipated Outcome(s): S OT Toileting Anticipated Outcome(s): S OT Bathroom Transfers Anticipated Outcome(s): S/CGA OT Recommendation Recommendations for Other Services: Therapeutic Recreation consult Therapeutic Recreation Interventions: Pet therapy;Stress management;Outing/community reintergration Patient destination: Home Follow Up Recommendations: Outpatient OT Equipment Details: pt has all needed DME, issued a RW splint for L hand   OT Evaluation Precautions/Restrictions  Precautions Precautions: Fall Precaution Comments: L sided weakness; BP <150 Restrictions Weight Bearing Restrictions: No Other Position/Activity Restrictions: Needs Loop Monitor cardiac device wiht him  in therapy General Chart Reviewed: Yes Family/Caregiver Present: No Vital Signs   Pain Pain Assessment Pain Scale: 0-10 Pain Score: 0-No pain Home Living/Prior Functioning Home Living Family/patient expects to be discharged to:: Private residence Living Arrangements: Spouse/significant other Available Help at Discharge: Family, Available 24 hours/day Type of Home: House Home Access: Ramped entrance, Stairs to enter [employer] of Steps: ramp into the house then 2 steps with grab bar Home Layout: One level Bathroom Shower/Tub: Multimedia programmer: Handicapped height Bathroom Accessibility: Yes Additional Comments: wife uses trasport chair for mobility in the home  Lives With: Spouse IADL History Education: some college Occupation: Retired Type of Occupation: Civil engineer, contracting Leisure and Hobbies: taking care of his dogs Prior Function Level of Independence: Independent with basic ADLs, Independent with gait, Independent with transfers, Requires assistive device for independence  Able to [place]?: Yes Driving: Yes Vocation: On disability Vision Baseline Vision/History: 1 Wears glasses Ability to See in Adequate Light: 0 Adequate Patient Visual Report: Peripheral vision impairment Vision Assessment?: Yes Eye Alignment: Within Functional Limits Ocular Range of Motion: Within Functional Limits Alignment/Gaze Preference: Within Defined Limits Tracking/Visual Pursuits: Able to track stimulus in all quads without difficulty Saccades: Additional eye shifts occurred during testing Convergence: Within functional limits Visual Fields: Left visual field deficit;Left homonymous hemianopsia Perception  Perception: Within Functional Limits Praxis Praxis: Intact Cognition Cognition Overall Cognitive Status: Impaired/Different from baseline Arousal/Alertness: Awake/alert Orientation Level: Person;Place;Situation Memory: Impaired Memory Impairment:  Decreased recall of new information;Retrieval deficit Attention: Focused;Sustained Focused Attention: Appears intact Sustained Attention: Appears intact Awareness: Appears intact Problem Solving: Impaired Problem Solving Impairment: Verbal complex Executive Function: Sequencing;Organizing Sequencing: Impaired Safety/Judgment: Appears intact Brief Interview for Mental Status (BIMS) Repetition of Three Words (First Attempt): 3 Temporal Orientation: Year: Correct Temporal Orientation: Month: Accurate within 5 days Temporal Orientation: Day: Correct Recall: "Sock": Yes, no cue required Recall: "Blue": Yes, after cueing ("a color") Recall: "Bed": Yes, no cue required BIMS Summary Score: 14 Sensation Sensation Light Touch: Appears Intact Hot/Cold: Appears Intact Proprioception: Appears Intact Stereognosis: Impaired by gross assessment Coordination Gross Motor Movements are Fluid and Coordinated: No Fine Motor Movements are Fluid and Coordinated: No Coordination and Movement Description: RLE WFL; LLE with decreased initiation and smoothness of movement Finger Nose Finger Test: impaired L side Heel Shin Test: slow to initiate but completes with  LLE; WFL for RLE 9 Hole Peg Test: unable to test Motor  Motor Motor: Other (comment) Motor - Skilled Clinical Observations: LLE with decreased initiation and smoothness of movement  Trunk/Postural Assessment  Cervical Assessment Cervical Assessment: Exceptions to [facility] Thoracic Assessment Thoracic Assessment: Exceptions to [facility] Lumbar Assessment Lumbar Assessment: Within Functional Limits Postural Control Postural Control: Deficits on evaluation (minimal trunkal dyscoordination with initiation of gait; resolves over short distance)  Balance Balance Balance Assessed: Yes Static Sitting Balance Static Sitting - Balance Support: Feet supported Static Sitting - Level of Assistance: 5: Stand by assistance Dynamic Sitting Balance Dynamic  Sitting - Balance Support: Feet supported Dynamic Sitting - Level of Assistance: 5: Stand by assistance Dynamic Sitting - Balance Activities: Forward lean/weight shifting;Reaching for weighted objects;Reaching for objects Sitting balance - Comments: seated EOB Static Standing Balance Static Standing - Balance Support: During functional activity;Right upper extremity supported Static Standing - Level of Assistance: 4: Min assist Dynamic Standing Balance Dynamic Standing - Balance Support: During functional activity;Right upper extremity supported Dynamic Standing - Level of Assistance: 3: Mod assist;4: Min assist Extremity/Trunk Assessment RUE Assessment RUE Assessment: Within Functional Limits LUE Assessment LUE Assessment: Exceptions to [facility] Passive Range of Motion (PROM) Comments: grossly WFL's with tighness at end ranges Active Range of Motion (AROM) Comments: sh 0-45 General Strength Comments: 2/5 grossly with flexor synergy pattern  Care Tool Care Tool Self Care Eating   Eating Assist Level: Set up assist    Oral Care    Oral Care Assist Level: Minimal Assistance - Patient > 75%    Bathing   Body parts bathed by patient: Left arm;Chest;Abdomen;Front perineal area;Right upper leg;Left upper leg;Face Body parts bathed by helper: Right arm;Buttocks;Left lower leg;Right lower leg   Assist Level: Moderate Assistance - Patient 50 - 74%    Upper Body Dressing(including orthotics)   What is the patient wearing?: Pull over shirt   Assist Level: Moderate Assistance - Patient 50 - 74%    Lower Body Dressing (excluding footwear)   What is the patient wearing?: Underwear/pull up;Pants Assist for lower body dressing: Maximal Assistance - Patient 25 - 49%    Putting on/Taking off footwear   What is the patient wearing?: Non-skid slipper socks Assist for footwear: Total Assistance - Patient < 25%       Care Tool Toileting Toileting activity   Assist for toileting: Moderate  Assistance - Patient 50 - 74%     Care Tool Bed Mobility Roll left and right activity   Roll left and right assist level: Contact Guard/Touching assist    Sit to lying activity   Sit to lying assist level: Supervision/Verbal cueing    Lying to sitting on side of bed activity   Lying to sitting on side of bed assist level: the ability to move from lying on the back to sitting on the side of the bed with no back support.: Contact Guard/Touching assist     Care Tool Transfers Sit to stand transfer   Sit to stand assist level: Moderate Assistance - Patient 50 - 74%    Chair/bed transfer   Chair/bed transfer assist level: Moderate Assistance - Patient 50 - 74%     Toilet transfer   Assist Level: Moderate Assistance - Patient 50 - 74%     Care Tool Cognition  Expression of Ideas and Wants Expression of Ideas and Wants: 4. Without difficulty (complex and basic) - expresses complex messages without difficulty and with speech that is clear and easy to  understand  Understanding Verbal and Non-Verbal Content Understanding Verbal and Non-Verbal Content: 4. Understands (complex and basic) - clear comprehension without cues or repetitions   Memory/Recall Ability Memory/Recall Ability : Current season;That he or she is in a hospital/hospital unit   Refer to Care Plan for [place] 1 OT Short Term Goal 1 (Week 1): Complete UB self care with S using hemi techniques OT Short Term Goal 2 (Week 1): Complete LB self care using hemi techniques with CGA OT Short Term Goal 3 (Week 1): Amb for toilet transfer with RW with CGA OT Short Term Goal 4 (Week 1): Pt will stand for oral care or kitchen sink task for up to 5 min with CGA  Recommendations for other services: Therapeutic Recreation  Pet therapy, Kitchen group, and Outing/community reintegration   Skilled Therapeutic Intervention ADL ADL Equipment Provided: Long-handled sponge Eating: Set up Where  Assessed-Eating: Bed level Grooming: Moderate assistance Where Assessed-Grooming: Sitting at sink Upper Body Bathing: Moderate assistance Where Assessed-Upper Body Bathing: Sitting at sink Lower Body Bathing: Maximal assistance Where Assessed-Lower Body Bathing: Sitting at sink;Standing at sink Upper Body Dressing: Moderate assistance Where Assessed-Upper Body Dressing: Sitting at sink Lower Body Dressing: Maximal assistance Where Assessed-Lower Body Dressing: Sitting at sink;Standing at sink Toileting: Minimal assistance Where Assessed-Toileting: Glass blower/designer: Moderate assistance Toilet Transfer Method: Counselling psychologist: Bedside commode;Grab bars Social research officer, government: Maximal Firefighter Method: Radiographer, therapeutic: Radio broadcast assistant ADL Comments: min A amb with RW with limited L grasp- now with L walker splint, min A UB self care, mod a LB self care pull on clothing Mobility  Bed Mobility Bed Mobility: Supine to Sit;Sit to Supine Supine to Sit: Contact Guard/Touching assist Sit to Supine: Supervision/Verbal cueing Transfers Sit to Stand: Moderate Assistance - Patient 50-74% Stand to Sit: Minimal Assistance - Patient > 75%  OT Intervention/Treatment:  Pt seen for full initial OT evaluation and training session this am. Pt in bed upon OT arrival. OT introduced role of therapy and purpose of session. OT conducted comprehensive OT evaluation and treatment session with status outlined in eval. OT care coordination completed for initiating OT treatment. MD coord as well with OT relaying findings of L VFD potential HH. OT educated on wide head turns to L side, L hemi techniques throughout ADL training routine and issued and trained in L RW splint. Pt will benefit from CIR to maximize functional level and safety prior to discharge home with family support. Pt left at end of session up in w/c with chair alarm set, tray  table and nurse call bell within reach.       Discharge Criteria: Patient will be discharged from OT if patient refuses treatment 3 consecutive times without medical reason, if treatment goals not met, if there is a change in medical status, if patient makes no progress towards goals or if patient is discharged from hospital.  The above assessment, treatment plan, treatment alternatives and goals were discussed and mutually agreed upon: by patient  Barnabas Lister 05/08/2022, 7:14 PM

## 2022-05-08 NOTE — Progress Notes (Signed)
Inpatient Rehabilitation Care Coordinator Assessment and Plan Patient Details  Name: Joseph Ortega MRN: [id number] Date of Birth: [birth year]  Today's Date: 05/08/2022  Hospital Problems: Principal Problem:   Right middle cerebral artery stroke [facility])  Past Medical History:  Past Medical History:  Diagnosis Date   Diabetes mellitus without complication ([place])    Hypertension    Thyroid disease    Past Surgical History:  Past Surgical History:  Procedure Laterality Date   HERNIA REPAIR     IR ANGIO INTRA EXTRACRAN SEL COM CAROTID INNOMINATE BILAT MOD SED  03/09/2021   IR ANGIO VERTEBRAL SEL VERTEBRAL BILAT MOD SED  03/09/2021   IR US GUIDE VASC ACCESS RIGHT  03/09/2021   LOOP RECORDER INSERTION N/A 05/07/2022   Procedure: LOOP RECORDER INSERTION;  Surgeon: Vickie Epley, MD;  Location: [place] [facility];  Service: Cardiovascular;  Laterality: N/A;   Social History:  reports that he has never smoked. He has never used smokeless tobacco. He reports that he does not drink alcohol and does not use drugs.  Family / Support Systems Patient Roles: Parent Spouse/Significant Other: [name] Children: N/a Other Supports: N/A Anticipated Caregiver: [name] (Wife) Ability/Limitations of Caregiver: none Caregiver Availability: 24/7 Family Dynamics: support from spouse  Social History Preferred language: English Religion:  Education: HS Health Literacy - How often do you need to have someone help you when you read instructions, pamphlets, or other written material from your doctor or pharmacy?: Sometimes Writes: Yes Employment Status: Unemployed Public relations account executive Issues: N/A Guardian/Conservator: [name]   Abuse/Neglect Abuse/Neglect Assessment Can Be Completed: Yes Physical Abuse: Denies Verbal Abuse: Denies Sexual Abuse: Denies Exploitation of patient/patient's resources: Denies Self-Neglect: Denies  Patient response to: Social Isolation - How often do you feel  lonely or isolated from those around you?: Never  Emotional Status Pt's affect, behavior and adjustment status: Pleasant Recent Psychosocial Issues: coping Psychiatric History: N/A Substance Abuse History: N/A  Patient / Family Perceptions, Expectations & Goals Pt/Family understanding of illness & functional limitations: yes Premorbid pt/family roles/activities: independent and requiring some asisstance with cogntive tasks from spouse Anticipated changes in roles/activities/participation: Spouse plans to assist at d/c. Up to min A Pt/family expectations/goals: Supervision  [employer]: None Premorbid Home Care/DME Agencies: Other (Comment) (RW, Building surveyor) Transportation available at discharge: Wife able to transport Is the patient able to respond to transportation needs?: Yes In the past 12 months, has lack of transportation kept you from medical appointments or from getting medications?: No In the past 12 months, has lack of transportation kept you from meetings, work, or from getting things needed for daily living?: No Resource referrals recommended: Neuropsychology  Discharge Planning Living Arrangements: Spouse/significant other Support Systems: Spouse/significant other Type of Residence: Private residence (1 level home, ramp entrance) Insurance Resources: Multimedia programmer (specify) Financial Resources: Family Support Financial Screen Referred: No Living Expenses: Own Money Management: Patient, Spouse Does the patient have any problems obtaining your medications?: No Home Management: Independent Patient/Family Preliminary Plans: Spouse able to assist with tsks if neede Care Coordinator Barriers to Discharge: Insurance for SNF coverage, Lack of/limited family support, Inaccessible home environment, Decreased caregiver support, Home environment access/layout Care Coordinator Anticipated Follow Up Needs: HH/OP Expected length of stay:  10-12 Days  Clinical Impression Sw met with patient, introduced self and explained role. Patient anticipates discharging home with his spouse able to assist up to [place]. 1 level home, ramp entrance. Patient has a rolling walker, rollator and shower seat. No additional  questions or concerns.  Dyanne Iha 05/08/2022, 12:43 PM

## 2022-05-08 NOTE — Evaluation (Signed)
Speech Language Pathology Assessment and Plan  Patient Details  Name: Joseph Ortega MRN: [id number] Date of Birth: April 02, 1955  SLP Diagnosis: Cognitive Impairments  Rehab Potential: Good ELOS: 12-14 days   Today's Date: 05/08/2022 SLP Individual Time: [id number] SLP Individual Time Calculation (min): 34 min  Hospital Problem: Principal Problem:   Right middle cerebral artery stroke [facility])  Past Medical History:  Past Medical History:  Diagnosis Date   Diabetes mellitus without complication ([place])    Hypertension    Thyroid disease    Past Surgical History:  Past Surgical History:  Procedure Laterality Date   HERNIA REPAIR     IR ANGIO INTRA EXTRACRAN SEL COM CAROTID INNOMINATE BILAT MOD SED  03/09/2021   IR ANGIO VERTEBRAL SEL VERTEBRAL BILAT MOD SED  03/09/2021   IR US GUIDE VASC ACCESS RIGHT  03/09/2021   LOOP RECORDER INSERTION N/A 05/07/2022   Procedure: LOOP RECORDER INSERTION;  Surgeon: Vickie Epley, MD;  Location: [place] [facility];  Service: Cardiovascular;  Laterality: N/A;    Assessment / Plan / Recommendation Clinical Impression  Joseph Ortega is a 68 year old right-handed male with history of hypertension as well as diabetes mellitus as well as 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  Per chart review patient lives with wife.  1 level home with ramped entrance.  Wife is reportedly limited mobility.  Presented 05/02/2022 with headache and nonspecific chest pain.  Cranial CT scan showed acute hemorrhagic infarct affecting the right posterior parietal lobe. Old infarction of the right frontal parietal vertex and right occipital lobe. MRI redemonstrated infarction with hemorrhage in the right temporoparietal region. Tolerating a regular consistency diet. Therapy evaluations completed due to patient decreased functional mobility was admitted for a comprehensive rehab program.   SLP consulted to complete  cognitive-linguistic evaluation in the setting of recent R MCA. Pt presents with mild cognitive-linguistic deficits in the domains of attention, memory, executive function, and visuospatial skills. SLP administered the cognitive linguistic quick test (CLQT) and pt scored WNL for language, and mild impairment in the above mentioned domains. Pt endorsed decreased short-term memory impacting his ability to effectively manage bills and medications approximately 2 weeks prior to his stroke. Pt demonstrated excellent insight into deficits, appropriate safety awareness, and functional judgement/reasoning skills. Pt's verbal expression, comprehension, and motor speech appear [facility] for all tasks assessed. OME was unremarkable. Pt would benefit from skilled ST intervention to address cognitive-linguistic deficits, to maximize functional independence, and reduce burden of care prior to discharge.   Skilled Therapeutic Interventions          SLP administered the CLQT and other informal measures. Please see report for full details.   SLP Assessment  Patient will need skilled [place] Pathology Services during CIR admission    Recommendations  Patient destination: Home Follow up Recommendations: Outpatient SLP Equipment Recommended: None recommended by SLP    SLP Frequency 3 to 5 out of 7 days   SLP Duration  SLP Intensity  SLP Treatment/Interventions 12-14 days  Minumum of 1-2 x/day, 30 to 90 minutes  Cognitive remediation/compensation;Functional tasks;Patient/family education;Therapeutic Activities    Pain Pain Assessment Pain Scale: 0-10 Pain Score: 0-No pain  Prior Functioning Type of Home: House  Lives With: Spouse Available Help at Discharge: Family;Available 24 hours/day Vocation: On disability  SLP Evaluation Cognition Overall Cognitive Status: Impaired/Different from baseline Arousal/Alertness: Awake/alert Orientation Level: Oriented X4 Year: 2024 Month: February Day of Week:  Correct Attention: Focused;Sustained Focused Attention: Appears intact  Sustained Attention: Appears intact Memory: Impaired Memory Impairment: Decreased recall of new information;Retrieval deficit Awareness: Appears intact Problem Solving: Impaired Problem Solving Impairment: Verbal complex Safety/Judgment: Appears intact  Comprehension Auditory Comprehension Overall Auditory Comprehension: Appears within functional limits for tasks assessed Yes/No Questions: Within Functional Limits Commands: Within Functional Limits Conversation: Complex Expression Expression Primary Mode of Expression: Verbal Verbal Expression Overall Verbal Expression: Appears within functional limits for tasks assessed Oral Motor Oral Motor/Sensory Function Overall Oral Motor/Sensory Function: Within functional limits Motor Speech Overall Motor Speech: Appears within functional limits for tasks assessed Respiration: Within functional limits Phonation: Normal Resonance: Within functional limits Articulation: Within functional limitis Intelligibility: Intelligible Motor Planning: Witnin functional limits  Care Tool Care Tool Cognition Ability to hear (with hearing aid or hearing appliances if normally used Ability to hear (with hearing aid or hearing appliances if normally used): 0. Adequate - no difficulty in normal conservation, social interaction, listening to TV   Expression of Ideas and Wants Expression of Ideas and Wants: 4. Without difficulty (complex and basic) - expresses complex messages without difficulty and with speech that is clear and easy to understand   Understanding Verbal and Non-Verbal Content Understanding Verbal and Non-Verbal Content: 4. Understands (complex and basic) - clear comprehension without cues or repetitions  Memory/Recall Ability Memory/Recall Ability : Current season;That he or she is in a hospital/hospital unit   Intelligibility: Intelligible  Short Term Goals: Week  1: SLP Short Term Goal 1 (Week 1): Patient will recall and use memory compensations with min A verbal cues for effectiveness SLP Short Term Goal 2 (Week 1): Patient will complete complex problem solving with iADL tasks including medication and/or money management with min A verbal cues SLP Short Term Goal 3 (Week 1): Patient will complete scheduling and/or organization tasks with min A verbal cues  Refer to Care Plan for Long Term Goals  Recommendations for other services: None   Discharge Criteria: Patient will be discharged from SLP if patient refuses treatment 3 consecutive times without medical reason, if treatment goals not met, if there is a change in medical status, if patient makes no progress towards goals or if patient is discharged from hospital.  The above assessment, treatment plan, treatment alternatives and goals were discussed and mutually agreed upon: by patient  Patty Sermons 05/09/2022, 8:19 AM

## 2022-05-08 NOTE — Progress Notes (Signed)
   05/08/22 1500  Spiritual Encounters  Type of Visit Initial  Care provided to: Patient  Referral source Patient request  Reason for visit Routine spiritual support  OnCall Visit No  Spiritual Framework  Presenting Themes Values and beliefs  Values/beliefs faith  Community/Connection Family  Interventions  Spiritual Care Interventions Made Compassionate presence;Reflective listening;Normalization of emotions  Intervention Outcomes  Outcomes Awareness around self/spiritual resourses   Ch responded to request for emotional and spiritual care. Pt is exploring ways to keep the stroke from reoccurring. Pt leans upon his faith. Ch explored faith and values and provided hospitality. No follow-up needed at this time.

## 2022-05-08 NOTE — Plan of Care (Signed)
Problem: RH Balance Goal: LTG Patient will maintain dynamic standing with ADLs (OT) Description: LTG:  Patient will maintain dynamic standing balance with assist during activities of daily living (OT)  Flowsheets (Taken 05/08/2022 1856) LTG: Pt will maintain dynamic standing balance during ADLs with: Supervision/Verbal cueing   Problem: Sit to Stand Goal: LTG:  Patient will perform sit to stand in prep for activites of daily living with assistance level (OT) Description: LTG:  Patient will perform sit to stand in prep for activites of daily living with assistance level (OT) Flowsheets (Taken 05/08/2022 1856) LTG: PT will perform sit to stand in prep for activites of daily living with assistance level: Supervision/Verbal cueing   Problem: RH Grooming Goal: LTG Patient will perform grooming w/assist,cues/equip (OT) Description: LTG: Patient will perform grooming with assist, with/without cues using equipment (OT) Flowsheets (Taken 05/08/2022 1856) LTG: Pt will perform grooming with assistance level of: Independent with assistive device    Problem: RH Bathing Goal: LTG Patient will bathe all body parts with assist levels (OT) Description: LTG: Patient will bathe all body parts with assist levels (OT) Flowsheets (Taken 05/08/2022 1856) LTG: Pt will perform bathing with assistance level/cueing: Independent with assistive device    Problem: RH Dressing Goal: LTG Patient will perform upper body dressing (OT) Description: LTG Patient will perform upper body dressing with assist, with/without cues (OT). Flowsheets (Taken 05/08/2022 1856) LTG: Pt will perform upper body dressing with assistance level of: Independent with assistive device Goal: LTG Patient will perform lower body dressing w/assist (OT) Description: LTG: Patient will perform lower body dressing with assist, with/without cues in positioning using equipment (OT) Flowsheets (Taken 05/08/2022 1856) LTG: Pt will perform lower body  dressing with assistance level of: Independent with assistive device   Problem: RH Toileting Goal: LTG Patient will perform toileting task (3/3 steps) with assistance level (OT) Description: LTG: Patient will perform toileting task (3/3 steps) with assistance level (OT)  Flowsheets (Taken 05/08/2022 1856) LTG: Pt will perform toileting task (3/3 steps) with assistance level: Independent with assistive device   Problem: RH Vision Goal: RH LTG Vision Theme park manager) Flowsheets (Taken 05/08/2022 1856) LTG: Vision Goals: Pt will perform compensatory techniques for L VFD with S   Problem: RH Functional Use of Upper Extremity Goal: LTG Patient will use RT/LT upper extremity as a (OT) Description: LTG: Patient will use right/left upper extremity as a stabilizer/gross assist/diminished/nondominant/dominant level with assist, with/without cues during functional activity (OT) Flowsheets (Taken 05/08/2022 1856) LTG: Use of upper extremity in functional activities: LUE as diminished level LTG: Pt will use upper extremity in functional activity with assistance level of: Supervision/Verbal cueing   Problem: RH Simple Meal Prep Goal: LTG Patient will perform simple meal prep w/assist (OT) Description: LTG: Patient will perform simple meal prep with assistance, with/without cues (OT). Flowsheets (Taken 05/08/2022 1856) LTG: Pt will perform simple meal prep with assistance level of: Contact Guard/Touching assist LTG: Pt will perform simple meal prep w/level of: Ambulate with device   Problem: RH Toilet Transfers Goal: LTG Patient will perform toilet transfers w/assist (OT) Description: LTG: Patient will perform toilet transfers with assist, with/without cues using equipment (OT) Flowsheets (Taken 05/08/2022 1856) LTG: Pt will perform toilet transfers with assistance level of: Supervision/Verbal cueing   Problem: RH Tub/Shower Transfers Goal: LTG Patient will perform tub/shower transfers w/assist  (OT) Description: LTG: Patient will perform tub/shower transfers with assist, with/without cues using equipment (OT) Flowsheets (Taken 05/08/2022 1856) LTG: Pt will perform tub/shower stall transfers with assistance level of: Contact  Guard/Touching assist

## 2022-05-08 NOTE — Discharge Instructions (Addendum)
Inpatient Rehab Discharge Instructions  Joseph Ortega Discharge date and time: No discharge date for patient encounter.   Activities/Precautions/ Functional Status: Activity: activity as tolerated Diet: diabetic diet Wound Care: Routine skin checks Functional status:  ___ No restrictions     ___ Walk up steps independently ___ 24/7 supervision/assistance   ___ Walk up steps with assistance ___ Intermittent supervision/assistance  ___ Bathe/dress independently ___ Walk with walker     _x__ Bathe/dress with assistance ___ Walk Independently    ___ Shower independently ___ Walk with assistance    ___ Shower with assistance ___ No alcohol     ___ Return to work/school ________   COMMUNITY REFERRALS UPON DISCHARGE:     Outpatient: PT     OT    ST               Agency: [facility] [id number]               Appointment Date/Time: Please allow 3-5 days for scheduling to reach out.   Medical Equipment/Items Ordered: Patient has RW                                                 Agency/Supplier: [employer] [phone]  Special Instructions: No driving smoking or alcohol    STROKE/TIA DISCHARGE INSTRUCTIONS SMOKING Cigarette smoking nearly doubles your risk of having a stroke & is the single most alterable risk factor  If you smoke or have smoked in the last 12 months, you are advised to quit smoking for your health. Most of the excess cardiovascular risk related to smoking disappears within a year of stopping. Ask you doctor about anti-smoking medications [place] Quit Line: 1-800-QUIT NOW Free Smoking Cessation Classes [phone]  CHOLESTEROL Know your levels; limit fat & cholesterol in your diet  Lipid Panel     Component Value Date/Time   CHOL 85 05/05/2022 0444   CHOL 128 06/15/2021 1052   TRIG 59 05/05/2022 0444   HDL 40 (L) 05/05/2022 0444   HDL 49 06/15/2021 1052   CHOLHDL 2.1 05/05/2022 0444   VLDL 12 05/05/2022 0444   LDLCALC 33 05/05/2022 0444   LDLCALC 57  06/15/2021 1052     Many patients benefit from treatment even if their cholesterol is at goal. Goal: Total Cholesterol (CHOL) less than 160 Goal:  Triglycerides (TRIG) less than 150 Goal:  HDL greater than 40 Goal:  LDL (LDLCALC) less than 100   BLOOD PRESSURE American Stroke Association blood pressure target is less that 120/80 mm/Hg  Your discharge blood pressure is:    Monitor your blood pressure Limit your salt and alcohol intake Many individuals will require more than one medication for high blood pressure  DIABETES (A1c is a blood sugar average for last 3 months) Goal HGBA1c is under 7% (HBGA1c is blood sugar average for last 3 months)  Diabetes:   Lab Results  Component Value Date   HGBA1C 8.3 (H) 05/02/2022    Your HGBA1c can be lowered with medications, healthy diet, and exercise. Check your blood sugar as directed by your physician Call your physician if you experience unexplained or low blood sugars.  PHYSICAL ACTIVITY/REHABILITATION Goal is 30 minutes at least 4 days per week  Activity: Increase activity slowly, Therapies: Physical Therapy: Home Health Return to work:  Activity decreases your risk of heart attack and stroke  and makes your heart stronger.  It helps control your weight and blood pressure; helps you relax and can improve your mood. Participate in a regular exercise program. Talk with your doctor about the best form of exercise for you (dancing, walking, swimming, cycling).  DIET/WEIGHT Goal is to maintain a healthy weight  Your discharge diet is:  Diet Order             Diet heart healthy/carb modified Room service appropriate? Yes with Assist; Fluid consistency: Thin  Diet effective now                   liquids Your height is:    Your current weight is: Weight: 110.2 kg Your Body Mass Index (BMI) is:  BMI (Calculated): 32.06 Following the type of diet specifically designed for you will help prevent another stroke. Your goal weight range is:    Your goal Body Mass Index (BMI) is 19-24. Healthy food habits can help reduce 3 risk factors for stroke:  High cholesterol, hypertension, and excess weight.  RESOURCES Stroke/Support Group:  Call [phone]   STROKE EDUCATION PROVIDED/REVIEWED AND GIVEN TO PATIENT Stroke warning signs and symptoms How to activate emergency medical system (call 911). Medications prescribed at discharge. Need for follow-up after discharge. Personal risk factors for stroke. Pneumonia vaccine given: No Flu vaccine given: No My questions have been answered, the writing is legible, and I understand these instructions.  I will adhere to these goals & educational materials that have been provided to me after my discharge from the hospital.      My questions have been answered and I understand these instructions. I will adhere to these goals and the provided educational materials after my discharge from the hospital.  Patient/Caregiver Signature _______________________________ Date __________  Clinician Signature _______________________________________ Date __________  Please bring this form and your medication list with you to all your follow-up doctor's appointments.

## 2022-05-08 NOTE — Plan of Care (Signed)
  Problem: RH Problem Solving Goal: LTG Patient will demonstrate problem solving for (SLP) Description: LTG:  Patient will demonstrate problem solving for basic/complex daily situations with cues  (SLP) Flowsheets (Taken 05/08/2022 1655) LTG: Patient will demonstrate problem solving for (SLP): Complex daily situations LTG Patient will demonstrate problem solving for:  Modified Independent  Supervision   Problem: RH Memory Goal: LTG Patient will use memory compensatory aids to (SLP) Description: LTG:  Patient will use memory compensatory aids to recall biographical/new, daily complex information with cues (SLP) Flowsheets (Taken 05/08/2022 1655) LTG: Patient will use memory compensatory aids to (SLP): Modified Independent

## 2022-05-08 NOTE — Progress Notes (Signed)
Patient ID: Joseph Ortega, male   DOB: 1955/06/08, 67 y.o.   MRN: [id number] Met with the patient to review current situation, rehab process, team conference and plan of care. Discussed new loop recorder and need to have phone nearby. Set up phone monitoring system and plugged in phone to charger. Reviewed secondary risks, medications and dietary modification recommendations.  Scab to wound on right leg with bil LE edema. Condom at HS removed; encourage toileting. Has CPAP at bedside. Continue to follow along to address educational needs to facilitate preparation for discharge

## 2022-05-08 NOTE — Progress Notes (Signed)
Admission: Patient arriving from [facility]. Report received by day Nurse Marjorie Smolder and current shift Nurse assigned to patient making aware patient being transferred to unit. Charge RN, Royston Cowper present as second Nurse for admission and skin assessment. Does have incision from ILR procedure that occurred prior to admission to left sternum covered with gauze. Other skin issues documented in flowsheet. Vitals obtained and oriented to unit. No further issues or concerns to report.

## 2022-05-08 NOTE — Progress Notes (Signed)
Inpatient [facility] Individual Statement of Services  Patient Name:  Joseph Ortega  Date:  05/08/2022  Welcome to the [facility].  Our goal is to provide you with an individualized program based on your diagnosis and situation, designed to meet your specific needs.  With this comprehensive rehabilitation program, you will be expected to participate in at least 3 hours of rehabilitation therapies Monday-Friday, with modified therapy programming on the weekends.  Your rehabilitation program will include the following services:  Physical Therapy (PT), Occupational Therapy (OT), Speech Therapy (ST), 24 hour per day rehabilitation nursing, Therapeutic Recreaction (TR), Neuropsychology, Care Coordinator, Rehabilitation Medicine, Nutrition Services, Pharmacy Services, and Other  Weekly team conferences will be held on Wednesdays to discuss your progress.  Your Inpatient Rehabilitation Care Coordinator will talk with you frequently to get your input and to update you on team discussions.  Team conferences with you and your family in attendance may also be held.  Expected length of stay: 10-12 Days  Overall anticipated outcome:  Supervision  Depending on your progress and recovery, your program may change. Your Inpatient Rehabilitation Care Coordinator will coordinate services and will keep you informed of any changes. Your Inpatient Rehabilitation Care Coordinator's name and contact numbers are listed  below.  The following services may also be recommended but are not provided by the [facility]:   [place] will be made to provide these services after discharge if needed.  Arrangements include referral to agencies that provide these services.  Your insurance has been verified to be:   [place] Your primary doctor is:  [facility]  Pertinent information will  be shared with your doctor and your insurance company.  Inpatient Rehabilitation Care Coordinator:  Erlene Quan, [place] or [phone]  Information discussed with and copy given to patient by: Dyanne Iha, 05/08/2022, 9:49 AM

## 2022-05-09 DIAGNOSIS — I63511 Cerebral infarction due to unspecified occlusion or stenosis of right middle cerebral artery: Secondary | ICD-10-CM | POA: Diagnosis not present

## 2022-05-09 LAB — GLUCOSE, CAPILLARY
Glucose-Capillary: 275 mg/dL — ABNORMAL HIGH (ref 70–99)
Glucose-Capillary: 228 mg/dL — ABNORMAL HIGH (ref 70–99)
Glucose-Capillary: 194 mg/dL — ABNORMAL HIGH (ref 70–99)
Glucose-Capillary: 307 mg/dL — ABNORMAL HIGH (ref 70–99)

## 2022-05-09 MED ORDER — INSULIN DETEMIR 100 UNIT/ML ~~LOC~~ SOLN
25.0000 [IU] | Freq: Every day | SUBCUTANEOUS | Status: DC
  Administered 2022-05-09 – 2022-05-10 (×2): 25 [IU] via SUBCUTANEOUS
  Filled 2022-05-09 (×3): qty 0.25

## 2022-05-09 MED ORDER — POTASSIUM CHLORIDE CRYS ER 10 MEQ PO TBCR
10.0000 meq | EXTENDED_RELEASE_TABLET | Freq: Every day | ORAL | Status: DC
  Administered 2022-05-09 – 2022-05-14 (×6): 10 meq via ORAL
  Filled 2022-05-09 (×6): qty 1

## 2022-05-09 NOTE — Progress Notes (Signed)
Patient ID: Joseph Ortega, male   DOB: 1955/12/04, 67 y.o.   MRN: [id number]  Team Conference Report to Patient/Family  Team Conference discussion was reviewed with the patient and caregiver, including goals, any changes in plan of care and target discharge date.  Patient and caregiver express understanding and are in agreement.  The patient has a target discharge date of  (evals pending).  Sw met with patient and made attempt to call spouse at bedside. Patient discharging home with spouse. Patient has expressed that his spouse will be unable to provide MIN A or physical assistance as reported due to her being in a WC. Sw ill share this information with the therapy team.Patient d/c date will be determined next week. No additional questions or concerns.    Dyanne Iha 05/09/2022, 2:10 PM

## 2022-05-09 NOTE — Progress Notes (Signed)
Physical Therapy Session Note  Patient Details  Name: Joseph Ortega MRN: [id number] Date of Birth: [birth year]  Today's Date: 05/09/2022 PT Individual Time: 1107-1203 PT Individual Time Calculation (min): 56 min   Short Term Goals: Week 1:  PT Short Term Goal 1 (Week 1): Pt will perform sit<>stand with CGA using RW. PT Short Term Goal 2 (Week 1): pt will perform stand pivot to/ from recliner with CGA/ MinA using RW PT Short Term Goal 3 (Week 1): Pt will perform dynamic standing/ balance activities with no AD and MinA. PT Short Term Goal 4 (Week 1): pt will ambulate longer distances using RW and improved LLE foot clearance. PT Short Term Goal 5 (Week 1): Pt will demonstrate decreased hip hike during backward step with LLE.  Skilled Therapeutic Interventions/Progress Updates:    Pt received sitting in w/c and agreeable to therapy session.  Transported to/from gym in w/c for time management and energy conservation.   Sit<>stands w/c<>RW vs no AD with CGA for steadying - pt has very slow and controlled movement with great biomechanics; however, decreased L LE WBing compared to R LE.   Gait training ~68f using RW with CGA for steadying/safety - pt demos excessive L LE hip/knee flexion for foot clearance during swing due to lack of ankle DF activation, landing with forefoot/midfoot rather than heel strike at initial contact.   Assessed sensation in L LE to be intact to touch. Pt has 2+/5 to 3-/5 ankle DF muscle activation.  Applied L LE functional electrical stimulation (FES) to anterior tibialis and ankle eversion muscles during gait training using [place] e-stim device set on NMES S with trigger switch to time activation of stimulation during swing phase and initial contact of gait - intensity 68 increased to 75 with palpable and visible muscle contraction - after completion of e-stim, pt denies any negative side effects with skin intact and no adverse side effects noted. Utilized this  throughout remainder of gait training.   Gait training ~637fusing RW with FES with continued CGA and pt having slight improvement in ankle positioning during swing and initial contact but not significant.   Gait training ~6035fsing +2 R HHA and L UE over therapist's shoulders with +2 min assist for balance - without AD, pt noted to have narrow BOS, excessive L LE adduction, decreased L hip abductor and glute activation with mild hip drop during L stance phase - continuing to utilize FES with same ankle DF improvement as noted above.   Donned L LE [place] Walk-on PLS AFO and performed gait training ~144f73fing +2 R HHA and L UE support around therapist's shoulders - still utilizing FES to increase ankle DF activation under the brace - pt demonstrating significant improvement in L LE foot clearance without excessive hip/knee flexion compensation as noted before, consistent L heel strike on initial contact, and improving L hip muscle activation during stance, improving step width and continuing to achieve reciprocal stepping pattern with increasing gait speed.   Pt denies any discomfort with AFO.  Transported back to his room and left seated in w/c with needs in reach, seat belt alarm on, and nurse present for meal tray delivery.    Therapy Documentation Precautions:  Precautions Precautions: Fall Precaution Comments: L sided weakness; BP <150 Restrictions Weight Bearing Restrictions: No Other Position/Activity Restrictions: Needs Loop Monitor cardiac device wiht him in therapy   Pain: No reports of pain throughout session.   Therapy/Group: Individual Therapy  Joseph Ortega, DPT, NCS,  CSRS 05/09/2022, 8:00 AM

## 2022-05-09 NOTE — Patient Care Conference (Signed)
Inpatient RehabilitationTeam Conference and Plan of Care Update Date: 05/09/2022   Time: 10:45 AM    Patient Name: Joseph Ortega      Medical Record Number: [id number]  Date of Birth: Sep 10, 1955 Sex: Male         Room/Bed: 4W06C/4W06C-01 Payor Info: Payor: [place] / Plan: BCBS COMM PPO / Product Type: *No Product type* /    Admit Date/Time:  05/07/2022  8:36 PM  Primary Diagnosis:  Right middle cerebral artery stroke [facility])  Hospital Problems: Principal Problem:   Right middle cerebral artery stroke [facility])    Expected Discharge Date: Expected Discharge Date:  (evals pending)  Team Members Present: Physician leading conference: Dr. Alysia Penna Social Worker Present: Erlene Quan, BSW Nurse Present: Dorien Chihuahua, RN PT Present: Page Spiro, PT OT Present: Other (comment) Lowella Fairy, OT) SLP Present: Weston Anna, SLP     Current Status/Progress Goal Weekly Team Focus  Bowel/Bladder   Pt is continent of b/b. LBM: 2/20   Pt will remain continent of b/b.   Assist w/ toileting needs q 2-4 hours and as needed.    Swallow/Nutrition/ Hydration               ADL's   min A amb with RW with limited L grasp- now with L walker splint, min A UB self care, mod a LB self care pull on clothing   S/CGA overall   L NMRE, functional balance, wide head turn compensation for L VFD    Mobility   Bed mobility = MinA; Sit<>stand with no AD = Mod/ MinA, Stand pivot with AD = Mod/ MinA; Ambulation  = CGA/ MinA using RW with L hand saddle splint, Stairs = MinA using R HR   supervision/ CGA overall  L hemi NMR, standing balance, activity tolerance, general strengthening, transfers, gait, stairs, family education, safety awareness    Communication                Safety/Cognition/ Behavioral Observations  sup-to-min A   mod I   memory, problem solving with iADLs involving medication and money management    Pain   Pt denies pain.   Pt will remain free of  pain.   Assess pain q shift & PRN.    Skin   Redness/Bruising to bilateral abd & arm.   Maintain intact skin integrity.  Assess skin q shift & PRN.      Discharge Planning:  Discharging home with spouse able to assis up to MIN A, 24/7   Team Discussion: Patient with left field cut'/neglect, LE weakness post right MCA CVA.  MD added Levamir for DM management.  Patient on target to meet rehab goals: yes, currently needs min assist for upper body care and mod assist for lower body care with min assist for transfers. Completes sit - stand using a RW wit mod assist; needs help to power up. Able to ambulate using a RW with splint and CGA.  Needs min assist for higher level problem solving and memory. Goals set for supervision - CGA overall.  *See Care Plan and progress notes for long and short-term goals.   Revisions to Treatment Plan:  N/a   Teaching Needs: Safety, medications, secondary risk management, tranfers, toileting, etc.   Current Barriers to Discharge: Decreased caregiver support and Home enviroment access/layout  Possible Resolutions to Barriers: Family education     Medical Summary Current Status: Labile hypertension, new left field cut, new left thumb weakness as well as  lower extremity weakness versus prior stroke.  Barriers to Discharge: Uncontrolled Hypertension       Continued Need for Acute Rehabilitation Level of Care: The patient requires daily medical management by a physician with specialized training in physical medicine and rehabilitation for the following reasons: Direction of a multidisciplinary physical rehabilitation program to maximize functional independence : Yes Medical management of patient stability for increased activity during participation in an intensive rehabilitation regime.: Yes Analysis of laboratory values and/or radiology reports with any subsequent need for medication adjustment and/or medical intervention. : Yes   I attest that I  was present, lead the team conference, and concur with the assessment and plan of the team.   Dorien Chihuahua B 05/09/2022, 3:32 PM

## 2022-05-09 NOTE — Plan of Care (Signed)
  Problem: Sit to Stand Goal: LTG:  Patient will perform sit to stand with assistance level (PT) Description: LTG:  Patient will perform sit to stand with assistance level (PT) Flowsheets (Taken 05/08/2022 1701) LTG: PT will perform sit to stand in preparation for functional mobility with assistance level: Supervision/Verbal cueing   Problem: RH Bed Mobility Goal: LTG Patient will perform bed mobility with assist (PT) Description: LTG: Patient will perform bed mobility with assistance, with/without cues (PT). Flowsheets (Taken 05/08/2022 1701) LTG: Pt will perform bed mobility with assistance level of: Independent with assistive device    Problem: RH Bed to Chair Transfers Goal: LTG Patient will perform bed/chair transfers w/assist (PT) Description: LTG: Patient will perform bed to chair transfers with assistance (PT). Flowsheets (Taken 05/08/2022 1701) LTG: Pt will perform Bed to Chair Transfers with assistance level: Supervision/Verbal cueing   Problem: RH Furniture Transfers Goal: LTG Patient will perform furniture transfers w/assist (OT/PT) Description: LTG: Patient will perform furniture transfers  with assistance (OT/PT). Flowsheets (Taken 05/08/2022 1701) LTG: Pt will perform furniture transfers with assist:: Supervision/Verbal cueing Note: In/ out of overstuffed rocking recliner with supervision.    Problem: RH Ambulation Goal: LTG Patient will ambulate in home environment (PT) Description: LTG: Patient will ambulate in home environment, # of feet with assistance (PT). Flowsheets (Taken 05/08/2022 1701) LTG: Pt will ambulate in home environ  assist needed:: Supervision/Verbal cueing LTG: Ambulation distance in home environment: up to 50 ft using LRAD Goal: LTG Patient will ambulate in community environment (PT) Description: LTG: Patient will ambulate in community environment, # of feet with assistance (PT). Flowsheets (Taken 05/08/2022 1701) LTG: Pt will ambulate in community  environ  assist needed:: Contact Guard/Touching assist LTG: Ambulation distance in community environment: >375f using LRAD   Problem: RH Stairs Goal: LTG Patient will ambulate up and down stairs w/assist (PT) Description: LTG: Patient will ambulate up and down # of stairs with assistance (PT) Flowsheets (Taken 05/08/2022 1701) LTG: Pt will ambulate up/down stairs assist needed:: Contact Guard/Touching assist LTG: Pt will  ambulate up and down number of stairs: at least 2 steps using HR setup as per home environment

## 2022-05-09 NOTE — Progress Notes (Signed)
Occupational Therapy Session Note  Patient Details  Name: Joseph Ortega MRN: [id number] Date of Birth: 11-30-1955  Today's Date: 05/09/2022 OT Individual Time: [id number] OT Individual Time Calculation (min): 72 min  OT Individual Time: 1350-1430 OT Individual Time Calculation (min): 40 min   Short Term Goals: Week 1:  OT Short Term Goal 1 (Week 1): Complete UB self care with S using hemi techniques OT Short Term Goal 2 (Week 1): Complete LB self care using hemi techniques with CGA OT Short Term Goal 3 (Week 1): Amb for toilet transfer with RW with CGA OT Short Term Goal 4 (Week 1): Pt will stand for oral care or kitchen sink task for up to 5 min with CGA  Skilled Therapeutic Interventions/Progress Updates:     AM Session: Pt received sitting up in bed presenting to be in good spirits and receptive to skilled OT session with a focus on L NMR, BADL retraining, and functional mobility. Pt reporting 0/10 pain.  Pt transitioned to EOB min A +time with HOB elevated. Pt able to doff shirt seated using one handed technique and pants min A +min cues for safety completing sit>stand using RW light mod A to power up for first stand of the day. UB bathing completed sitting EOB for dynamic sitting balance challenge with facilitation on LUE use during task. Max cueing and min hand under hand support provided under elbow to support LUE usage with Pt able to wash RUE, chest, face, and abdomin with LUE with maximal effort given. Donne shirt seated Eob with supervision. Pants donned with mod A to weave LLE into pants and to bring pants to waist with Pt able to initiate using LUE during task. Grooming/hygiene tasks completed standing at sink for dynamic standing balance challenge using RW. Pt able to reach outside base of support with RUE and maintain balance while brushing teeth with CGA.  Pt ambulated to therapy gym using RW for functional mobility and endurance training using RW CGA. Mod cueing required for task  for step length and weight shifting with Pt presenting with step-to walking pattern vs. Step through. No LOB or rest breaks required during functional mobility.  Pt completed gentle AAROM seated EOM to increase ROM and active use of LUE with Pt reporting decreased pain/tightness in LUE following. Worked on supination/pronation and internal/external rotation seated EOM with Pt holding 2lb dumb bell to increase proprioception and provide gentle stretch to increase ROM for functional reaching seated EOM. Pt guided on breathing with movement and muscle relaxation with improvement in ROM noted.  Pt transported back to room total A in wc. Pt was left resting in wc with call bell in reach, seat belt alarm on, and all needs met.    PM Session: Pt received sitting in wc presenting to be in good spirits and receptive skilled therapy session. Pt reporting 0/10 pain, however stating his legs were feeling more fatigued compared to this AM. Therapeutic support and education provided on importance of moving legs when seated in wc/recliner to promote blood flow and preventing stiffness with Pt receptive.  Pt requesting to walk to gym this session. Sit>stand using RW light min A with improved anterior weight shifting and LE engagement noted. Pt ambulated to therapy gym CGA using RW with mod tactile and verbal cues provided for step length and weight shifting.  Therapy dog present in gym at beginning of session. OT facilitated functional reaching with LUE to pet therapy dog to increase strength for BADL tasks and improve Pt  moral/motivation for therapy sessions. Pt able to utilize LUE during inferior reaching with light min A provided to support functional movement patterns. Discussed d/c plans and Pt responsibilities during task with Pt reporting he plans to assist with pet care, assists wife with bathing/toileting, vacuuming, sweeping, and laundry upon d/c. Pt reporting his wife is in wc and is able to assist with meal prep  at wc level. (Following session, discussed potential of upgrading Pt goals with PT and SLP to appropriately reflect independence level required by Pt- will plan to upgrade goals as appropriate).  Pt completed gentle AAROM standing at elevated table to increase elbow and shoulder ROM in preparation for functional tasks with noted improvement. Pt completed gentle simulated cleaning task to increase shoulder protraction/retraction and elbow flexion/extension with targeted reach incorporated into activity. Mod cueing required to facilitate improved muscle activation and support correct motor movement patterns. Pt then completed functional reaching task in standing with textured cones to improve functional reaching required for BADL and IADL tasks. Pt able to utilize LUE to reach for cones with min A proved under elbow and max Vb cueing provided for finger flexion/extension. Min A required during grasp and release d/t thumb IP joint tightness. Seated rest break provided following tasks d/t Pt report of fatigue. Pt transported back to room total a in wc for energy conservation. Pt was left resting in wc with call bell in reach, seat belt alarm on, and all needs met.   Therapy Documentation Precautions:  Precautions Precautions: Fall Precaution Comments: L sided weakness; BP <150 Restrictions Weight Bearing Restrictions: No Other Position/Activity Restrictions: Needs Loop Monitor cardiac device wiht him in therapy General:   Vital Signs: Therapy Vitals Temp: 97.8 F (36.6 C) Temp Source: Oral Pulse Rate: 62 Resp: 17 BP: 137/65 Patient Position (if appropriate): Lying Oxygen Therapy SpO2: 98 % O2 Device: Room Air Pain:   ADL: ADL Equipment Provided: Long-handled sponge Eating: Set up Where Assessed-Eating: Bed level Grooming: Moderate assistance Where Assessed-Grooming: Sitting at sink Upper Body Bathing: Moderate assistance Where Assessed-Upper Body Bathing: Sitting at sink Lower Body  Bathing: Maximal assistance Where Assessed-Lower Body Bathing: Sitting at sink, Standing at sink Upper Body Dressing: Moderate assistance Where Assessed-Upper Body Dressing: Sitting at sink Lower Body Dressing: Maximal assistance Where Assessed-Lower Body Dressing: Sitting at sink, Standing at sink Toileting: Minimal assistance Where Assessed-Toileting: Glass blower/designer: Moderate assistance Toilet Transfer Method: Counselling psychologist: Bedside commode, Energy manager: Maximal Firefighter Method: Radiographer, therapeutic: Radio broadcast assistant ADL Comments: min A amb with RW with limited L grasp- now with L walker splint, min A UB self care, mod a LB self care pull on clothing  Therapy/Group: Individual Therapy  Janey Genta 05/09/2022, 7:46 AM

## 2022-05-09 NOTE — Progress Notes (Signed)
PROGRESS NOTE   Subjective/Complaints:  No issues overnite Pt without c/os States that Left field cut is new since this CVA and Left thumb is weaker than before  ROS- denies CP, SOB, N/V/D Objective:   EP PPM/ICD IMPLANT  Result Date: 05/07/2022 CONCLUSIONS:  1. Successful implantation of a implantable loop recorder for Cryptogenic stroke  2. No early apparent complications.   Recent Labs    05/07/22 2223 05/08/22 0536  WBC 8.1 6.1  HGB 15.4 13.4  HCT 43.7 39.3  PLT 216 181    Recent Labs    05/07/22 0354 05/07/22 2223 05/08/22 0536  NA 136  --  136  K 3.1*  --  3.3*  CL 98  --  98  CO2 25  --  30  GLUCOSE 202*  --  244*  BUN 18  --  17  CREATININE 1.14 1.36* 1.14  CALCIUM 9.2  --  9.0     Intake/Output Summary (Last 24 hours) at 05/09/2022 [id number] Last data filed at 05/09/2022 0758 Gross per 24 hour  Intake 720 ml  Output 200 ml  Net 520 ml         Physical Exam: Vital Signs Blood pressure 137/65, pulse 62, temperature 97.8 F (36.6 C), temperature source Oral, resp. rate 17, weight 110.2 kg, SpO2 98 %.   General: No acute distress Mood and affect are appropriate Heart: Regular rate and rhythm no rubs murmurs or extra sounds Lungs: Clear to auscultation, breathing unlabored, no rales or wheezes Abdomen: Positive bowel sounds, soft nontender to palpation, nondistended Extremities: No clubbing, cyanosis, or edema Skin: No evidence of breakdown, no evidence of rash Neurologic: Cranial nerves II through XII intact, motor strength is 5/5 in right and 3- left deltoid, bicep, tricep, grip, hip flexor, knee extensors, ankle dorsiflexor and plantar flexor Sensory exam normal sensation to light touch and proprioception in bilateral upper and lower extremities Cerebellar exam deferred due to weakness  Musculoskeletal: Full range of motion in all 4 extremities. No joint swelling   Assessment/Plan: 1.  Functional deficits which require 3+ hours per day of interdisciplinary therapy in a comprehensive inpatient rehab setting. Physiatrist is providing close team supervision and 24 hour management of active medical problems listed below. Physiatrist and rehab team continue to assess barriers to discharge/monitor patient progress toward functional and medical goals  Care Tool:  Bathing    Body parts bathed by patient: Left arm, Chest, Abdomen, Front perineal area, Right upper leg, Left upper leg, Face   Body parts bathed by helper: Right arm, Buttocks, Left lower leg, Right lower leg     Bathing assist Assist Level: Moderate Assistance - Patient 50 - 74%     Upper Body Dressing/Undressing Upper body dressing   What is the patient wearing?: Pull over shirt    Upper body assist Assist Level: Moderate Assistance - Patient 50 - 74%    Lower Body Dressing/Undressing Lower body dressing      What is the patient wearing?: Underwear/pull up, Pants     Lower body assist Assist for lower body dressing: Maximal Assistance - Patient 25 - 49%     Toileting Toileting    Toileting  assist Assist for toileting: Moderate Assistance - Patient 50 - 74%     Transfers Chair/bed transfer  Transfers assist     Chair/bed transfer assist level: Moderate Assistance - Patient 50 - 74%     Locomotion Ambulation   Ambulation assist      Assist level: Minimal Assistance - Patient > 75% Assistive device: Walker-rolling Max distance: 200 ft   Walk 10 feet activity   Assist     Assist level: Moderate Assistance - Patient - 50 - 74% Assistive device: No Device   Walk 50 feet activity   Assist    Assist level: Minimal Assistance - Patient > 75% Assistive device: Walker-rolling    Walk 150 feet activity   Assist    Assist level: Minimal Assistance - Patient > 75% Assistive device: Walker-rolling    Walk 10 feet on uneven surface  activity   Assist Walk 10 feet on  uneven surfaces activity did not occur: Safety/medical concerns         Wheelchair     Assist Is the patient using a wheelchair?: Yes Type of Wheelchair: Manual    Wheelchair assist level: Maximal Assistance - Patient 25 - 49% Max wheelchair distance: 60 ft    Wheelchair 50 feet with 2 turns activity    Assist        Assist Level: Maximal Assistance - Patient 25 - 49%   Wheelchair 150 feet activity     Assist      Assist Level: Total Assistance - Patient < 25%   Blood pressure 137/65, pulse 62, temperature 97.8 F (36.6 C), temperature source Oral, resp. rate 17, weight 110.2 kg, SpO2 98 %.  Medical Problem List and Plan: 1. 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             -patient may  shower             -ELOS/Goals: 10-12d Supervision Team conference today please see physician documentation under team conference tab, met with team  to discuss problems,progress, and goals. Formulized individual treatment plan based on medical history, underlying problem and comorbidities.  2.  Antithrombotics: -DVT/anticoagulation:  Pharmaceutical: Heparin- switch to lovenox , no renal disease              -antiplatelet therapy: Aspirin 81 mg daily resumed on 05/07/2022 3. Pain Management: Topamax 50 mg daily for headache 4. Mood/Behavior/Sleep: Provide emotional support             -antipsychotic agents: N/A 5. Neuropsych/cognition: This patient is capable of making decisions on his own behalf. 6. Skin/Wound Care: Routine skin checks 7. Fluids/Electrolytes/Nutrition: Routine in and outs with follow-up chemistries hypoK+ on HCTZ start daily KCL, recheck on 2/23 8.  Hypothyroidism.  Synthroid 9.  Diabetes mellitus.  Hemoglobin A1c 8.3.  Currently on SSI.  Patient was on Levemir 39 units twice daily prior to admission as well as Farxiga 10 mg daily and Trulicity 3 mg every Thursday.  Resume as  needed\ CBG (last 3)  Recent Labs    05/08/22 1642 05/08/22 2044 05/09/22 0534  GLUCAP 244* 280* 275*   Restart levimir, 15U qhs 2/20, increase to 25U   10.  Hypertension.  Coreg 12.5 mg twice daily, HCTZ 25 mg daily.  Monitor with increased mobility Vitals:   05/08/22 1952 05/09/22 0421  BP: (!) 115/53 137/65  Pulse: 63 62  Resp: 16 17  Temp: 97.9 F (36.6 C) 97.8 F (  36.6 C)  SpO2: 97% 98%  Well controlled   11.  Hyperlipidemia.  Crestor 12.  Urinary incontinence  will try to wean off condom cath     LOS: 2 days A FACE TO FACE EVALUATION WAS PERFORMED  Charlett Blake 05/09/2022, 9:28 AM

## 2022-05-09 NOTE — Progress Notes (Signed)
Physical Therapy Session Note  Patient Details  Name: Joseph Ortega MRN: [id number] Date of Birth: 15-Jan-1956  Today's Date: 05/09/2022 PT Individual Time: [id number] PT Individual Time Calculation (min): 44 min   Short Term Goals: Week 1:  PT Short Term Goal 1 (Week 1): Pt will perform sit<>stand with CGA using RW. PT Short Term Goal 2 (Week 1): pt will perform stand pivot to/ from recliner with CGA/ MinA using RW PT Short Term Goal 3 (Week 1): Pt will perform dynamic standing/ balance activities with no AD and MinA. PT Short Term Goal 4 (Week 1): pt will ambulate longer distances using RW and improved LLE foot clearance. PT Short Term Goal 5 (Week 1): Pt will demonstrate decreased hip hike during backward step with LLE.  Skilled Therapeutic Interventions/Progress Updates:    Pt received sitting in w/c and eager to participate in therapy session. Transported to/from gym in w/c for time management and energy conservation.  Pt confirms that his wife uses a wheelchair and that at baseline he assists his wife with toileting hygiene, showering, and that he used a walker to perform sweeping and mopping. Reports their grandson is staying with his wife currently to provide her support.  Pt confirms that he will need to be at mod-I level for discharge - updated LTGs to reflect this.  Reports L LE [place] Walk-on AFO is feeling "tight" in his shoe - removed shoe and AFO to assess skin - all intact and no redness noted; however, did not leave it donned at end of session.  Gait training ~153f using +2 R HHA and LUE support around therapist's shoulders providing +2 min assist (heavy support through R hand via 2nd person) - wearing AFO - continued to start with narrow BOS requiring cuing to improve with increased L hip abduction activation, continues to have mild L hip instability during stance - verbal cuing for increased L hip/knee flexion during swing to improve foot clearance with pt then able to  achieve more consistent heel strike at initial contact.  Dynamic gait training using +2 R HHA and L UE support over therapist's shoulders with +2 min assist for balance while side stepping ~24feach direction - pt with excellent L LE hip/knee flexion for foot clearance while stepping sideways, more difficulty when going towards R. Progressed to backwards gait training ~2087fith same support as above - therapist intermittently providing manual facilitation for increased L knee flexion and hip extension for slightly larger backwards step using leg strap.  Transported back to his room and pt left seated in w/c with needs in reach and seat belt alarm on.  Therapy Documentation Precautions:  Precautions Precautions: Fall Precaution Comments: L sided weakness; BP <150 Restrictions Weight Bearing Restrictions: No Other Position/Activity Restrictions: Needs Loop Monitor cardiac device wiht him in therapy   Pain:  No reports of pain throughout session.    Therapy/Group: Individual Therapy  CarTawana ScalePT, DPT, NCS, CSRS 05/09/2022, 12:38 PM

## 2022-05-09 NOTE — Plan of Care (Signed)
  Problem: RH Balance Goal: LTG Patient will maintain dynamic standing balance (PT) Description: LTG:  Patient will maintain dynamic standing balance with assistance during mobility activities (PT) Flowsheets (Taken 05/09/2022 1851) LTG: Pt will maintain dynamic standing balance during mobility activities with:: (upgraded based on pt's CLOF and available support at D/C) Independent with assistive device  Note: upgraded based on pt's CLOF and available support at D/C    Problem: Sit to Stand Goal: LTG:  Patient will perform sit to stand with assistance level (PT) Description: LTG:  Patient will perform sit to stand with assistance level (PT) Flowsheets (Taken 05/09/2022 1851) LTG: PT will perform sit to stand in preparation for functional mobility with assistance level: (upgraded based on pt's CLOF and available support at D/C) Independent with assistive device Note: upgraded based on pt's CLOF and available support at D/C    Problem: RH Bed to Chair Transfers Goal: LTG Patient will perform bed/chair transfers w/assist (PT) Description: LTG: Patient will perform bed to chair transfers with assistance (PT). Flowsheets (Taken 05/09/2022 1851) LTG: Pt will perform Bed to Chair Transfers with assistance level: (upgraded based on pt's CLOF and available support at D/C) Independent with assistive device  Note: upgraded based on pt's CLOF and available support at D/C    Problem: RH Furniture Transfers Goal: LTG Patient will perform furniture transfers w/assist (OT/PT) Description: LTG: Patient will perform furniture transfers  with assistance (OT/PT). Flowsheets (Taken 05/09/2022 1851) LTG: Pt will perform furniture transfers with assist:: (upgraded based on pt's CLOF and available support at D/C) Independent with assistive device  Note: upgraded based on pt's CLOF and available support at D/C    Problem: RH Ambulation Goal: LTG Patient will ambulate in home environment (PT) Description: LTG:  Patient will ambulate in home environment, # of feet with assistance (PT). Flowsheets (Taken 05/09/2022 1851) LTG: Pt will ambulate in home environ  assist needed:: (upgraded based on pt's CLOF and available support at D/C) Independent with assistive device LTG: Ambulation distance in home environment: 93f using LRAD Note: upgraded based on pt's CLOF and available support at D/C  Goal: LTG Patient will ambulate in community environment (PT) Description: LTG: Patient will ambulate in community environment, # of feet with assistance (PT). Flowsheets (Taken 05/09/2022 1851) LTG: Pt will ambulate in community environ  assist needed:: (upgraded based on pt's CLOF and available support at D/C) Supervision/Verbal cueing LTG: Ambulation distance in community environment: 1578fusing LRAD Note: upgraded based on pt's CLOF and available support at D/C    Problem: RH Stairs Goal: LTG Patient will ambulate up and down stairs w/assist (PT) Description: LTG: Patient will ambulate up and down # of stairs with assistance (PT) Flowsheets (Taken 05/09/2022 1851) LTG: Pt will ambulate up/down stairs assist needed:: (upgraded based on pt's CLOF and available support at D/C) Supervision/Verbal cueing LTG: Pt will  ambulate up and down number of stairs: 2 steps using HRs per home set-up Note: upgraded based on pt's CLOF and available support at D/C

## 2022-05-10 ENCOUNTER — Ambulatory Visit: Payer: BC Managed Care – PPO | Admitting: Physical Therapy

## 2022-05-10 LAB — GLUCOSE, CAPILLARY
Glucose-Capillary: 277 mg/dL — ABNORMAL HIGH (ref 70–99)
Glucose-Capillary: 210 mg/dL — ABNORMAL HIGH (ref 70–99)
Glucose-Capillary: 255 mg/dL — ABNORMAL HIGH (ref 70–99)
Glucose-Capillary: 276 mg/dL — ABNORMAL HIGH (ref 70–99)

## 2022-05-10 NOTE — Progress Notes (Signed)
Orthopedic Tech Progress Note Patient Details:  Joseph Ortega [birth year] [id number] Called in order to [facility] for AFO consult Patient ID: Ariez Nolette, male   DOB: [birth year], 67 y.o.   MRN: [id number]  Chip Boer 05/10/2022, 10:27 AM

## 2022-05-10 NOTE — Progress Notes (Addendum)
Speech Language Pathology Daily Session Note  Patient Details  Name: Solace Galetti MRN: [id number] Date of Birth: 1955-07-07  Today's Date: 05/10/2022 SLP Individual Time: [id number] SLP Individual Time Calculation (min): 45 min  Short Term Goals: Week 1: SLP Short Term Goal 1 (Week 1): Patient will recall and use memory compensations with min A verbal cues for effectiveness SLP Short Term Goal 2 (Week 1): Patient will complete complex problem solving with iADL tasks including medication and/or money management with min A verbal cues SLP Short Term Goal 3 (Week 1): Patient will complete scheduling and/or organization tasks with min A verbal cues  Skilled Therapeutic Interventions: Pt seen this date for skilled ST intervention targeting cognitive goals outlined above. Pt greeted awake/alert and lying semi-reclined in bed. Agreeable to intervention. Pleasant and participatory throughout. Reports wife was completing medication and money management related tasks 2 weeks prior to admission due to forgetfulness.   SLP intervention with emphasis on cognitive skills within the context of functional iADL task r/t medication management. SLP facilitated today's session by providing education and demonstration cues on implementation of compensatory memory strategies to recall 10 of his currently scheduled medications. With SLP providing visual supports and organization/grouping of like medications, pt able to recall 10 out of 10 targeted medications with Min-Mod A verbal cues. Recalled with 100% accuracy, following delay with distractions, given same level of cueing - unable to fade. Sustained attention throughout session with Sup A.   Additionally, provided education re: ST POC and provided motivational interviewing type questions to assess pt's interest in resuming money and medication management tasks, which he verbalized he would like to do to take some of the "load" off of his wife. Will plan to fill pill  organizer in upcoming session(s).  Pt left in room and in bed with all safety measures activated, call bell within reach, and all immediate needs met. Continue per current ST POC.  Pain No pain reported; NAD   Therapy/Group: Individual Therapy  Asuna Peth A Dozier Berkovich 05/10/2022, 1:01 PM

## 2022-05-10 NOTE — Progress Notes (Signed)
Occupational Therapy Session Note  Patient Details  Name: Ignazio Moscone MRN: [id number] Date of Birth: 04/22/1955  Today's Date: 05/10/2022 OT Individual Time: 1430-1530 OT Individual Time Calculation (min): 60 min   Short Term Goals: Week 1:  OT Short Term Goal 1 (Week 1): Complete UB self care with S using hemi techniques OT Short Term Goal 2 (Week 1): Complete LB self care using hemi techniques with CGA OT Short Term Goal 3 (Week 1): Amb for toilet transfer with RW with CGA OT Short Term Goal 4 (Week 1): Pt will stand for oral care or kitchen sink task for up to 5 min with CGA  Skilled Therapeutic Interventions/Progress Updates:     Pt received sitting up in wc presenting to be fatigued, however in good spirits and receptive to skilled OT session. Pt reporting fatigue following previous therapy sessions of the day- increased rest breaks provided this session and activities completed in sitting for energy conservation.   Pt transported to therapy gym total A in wc for time management and energy conservation. Pt completed stand step>EOM with light mod HHA with Pt noticeably increased in fatigue with smaller step length and increased effort required for each step vs. Previous session. Pt provided short rest break following transfer.   Pt completed seated AAROM exercises to increase LUE ROM and muscle activation in preparation for functional activities with focus on functional movement patterns and breathing with movements. Pt completed 2x10 reps of BUE exercises with min A provided under LUE and tactile cues provided for muscle activation and VB cueing for PLB'ing/technique:  -Shoulder protraction/retraction  -Anterior/posterior shoulder rolls -Shoulder internal/external rotation -Forearm supination/pronation   Pt completed seated functional reaching task EOM to remove cards from vertical mirror to facilitate reaching movements required for ADL and IADL tasks. Pt demonstrating challenges  lifting LUE above ~90 degrees with maximal effort given and tends to compensate for decreased ROM with proximal movements of trunk and shoulder elevation and pronation of the forearm. Facilitated moving through shoulder retraction/protraction with external rotation and supination at the forearm to retrieve cards. Pt completed 6x2 trials with increased time for rest breaks provided between sets.   Pt transported back to room total A in wc for energy conservation. Pt was left resting in wc with call bell in reach, seat belt alarm on, and all needs met.   Therapy Documentation Precautions:  Precautions Precautions: Fall Precaution Comments: L sided weakness; BP <150 Restrictions Weight Bearing Restrictions: No Other Position/Activity Restrictions: Needs Loop Monitor cardiac device wiht him in therapy General:   Vital Signs: Therapy Vitals Temp: 98.5 F (36.9 C) Temp Source: Oral Pulse Rate: 67 Resp: 18 BP: 112/63 Patient Position (if appropriate): Sitting Oxygen Therapy SpO2: 98 % O2 Device: Room Air Pain:   ADL: ADL Equipment Provided: Long-handled sponge Eating: Set up Where Assessed-Eating: Bed level Grooming: Moderate assistance Where Assessed-Grooming: Sitting at sink Upper Body Bathing: Moderate assistance Where Assessed-Upper Body Bathing: Sitting at sink Lower Body Bathing: Maximal assistance Where Assessed-Lower Body Bathing: Sitting at sink, Standing at sink Upper Body Dressing: Moderate assistance Where Assessed-Upper Body Dressing: Sitting at sink Lower Body Dressing: Maximal assistance Where Assessed-Lower Body Dressing: Sitting at sink, Standing at sink Toileting: Minimal assistance Where Assessed-Toileting: Glass blower/designer: Moderate assistance Toilet Transfer Method: Counselling psychologist: Bedside commode, Energy manager: Maximal Firefighter Method: Radiographer, therapeutic:  Transfer tub bench ADL Comments: min A amb with RW with limited L grasp- now  with L walker splint, min A UB self care, mod a LB self care pull on clothing  Therapy/Group: Individual Therapy  Janey Genta 05/10/2022, 3:02 PM

## 2022-05-10 NOTE — IPOC Note (Signed)
Overall Plan of Care [facility]) Patient Details Name: Joseph Ortega MRN: [id number] DOB: 04/22/1955  Admitting Diagnosis: Right middle cerebral artery stroke [name])  Hospital Problems: Principal Problem:   Right middle cerebral artery stroke [facility])     Functional Problem List: Nursing Bladder, Medication Management, Safety, Endurance, Pain, Skin Integrity  PT Balance, Endurance, Motor, Perception, Safety  OT Balance, Endurance, Perception, Vision, Motor, Safety, Cognition, Sensory, Skin Integrity  SLP Cognition  TR         Basic ADL's: OT Grooming, Eating, Bathing, Dressing, Toileting     Advanced  ADL's: OT Simple Meal Preparation, Laundry, Light Housekeeping     Transfers: PT Bed Mobility, Bed to Chair, Manufacturing systems engineer, Metallurgist: PT Ambulation, Stairs     Additional Impairments: OT Fuctional Use of Upper Extremity  SLP Social Cognition   Problem Solving, Memory, Attention  TR      Anticipated Outcomes Item Anticipated Outcome  Self Feeding mod I  Swallowing  N/A   Basic self-care  S  Toileting  S   Bathroom Transfers S/CGA  Bowel/Bladder  manage bladder w toileting assist  Transfers  supervision/ CGA  Locomotion  supervision/ CGA  Communication  N/A  Cognition  mod I  Pain  < 4 with prns  Safety/Judgment  manage w cues   Therapy Plan: PT Intensity: Minimum of 1-2 x/day ,45 to 90 minutes PT Frequency: 5 out of 7 days PT Duration Estimated Length of Stay: 12-14 days OT Intensity: Minimum of 1-2 x/day, 45 to 90 minutes OT Frequency: 5 out of 7 days OT Duration/Estimated Length of Stay: 2 weeks SLP Intensity: Minumum of 1-2 x/day, 30 to 90 minutes SLP Frequency: 3 to 5 out of 7 days SLP Duration/Estimated Length of Stay: 12-14 days   Team Interventions: Nursing Interventions Bladder Management, Disease Management/Prevention, Medication Management, Discharge Planning, Pain Management, Patient/Family Education  PT interventions  Ambulation/gait training, Community reintegration, DME/adaptive equipment instruction, Neuromuscular re-education, Psychosocial support, Stair training, UE/LE Strength taining/ROM, Wheelchair propulsion/positioning, UE/LE Coordination activities, Therapeutic Activities, Skin care/wound management, Pain management, Discharge planning, Training and development officer, Cognitive remediation/compensation, Disease management/prevention, Functional mobility training, Patient/family education, Splinting/orthotics, Therapeutic Exercise, Visual/perceptual remediation/compensation  OT Interventions Training and development officer, Community reintegration, Disease mangement/prevention, Neuromuscular re-education, Patient/family education, Self Care/advanced ADL retraining, Splinting/orthotics, Therapeutic Exercise, UE/LE Coordination activities, Wheelchair propulsion/positioning, Cognitive remediation/compensation, Discharge planning, DME/adaptive equipment instruction, Functional mobility training, Psychosocial support, Skin care/wound managment, Therapeutic Activities, UE/LE Strength taining/ROM, Visual/perceptual remediation/compensation  SLP Interventions Cognitive remediation/compensation, Functional tasks, Patient/family education, Therapeutic Activities  TR Interventions    SW/CM Interventions Discharge Planning, Psychosocial Support, Patient/Family Education, Disease Management/Prevention   Barriers to Discharge MD  Medical stability  Nursing Decreased caregiver support, Home environment access/layout 1 level ramped entry, ramp into the house then 2 steps with grab bar; wife is in a wheelchair; limited assistance; has DME (reports walked limited household distances only. Pt takes public transport to OP therapy appointments.  ADLs Comments: Wife provides stand by assist for shower. Does not provide physical hands on assist.)  PT Inaccessible home environment, Decreased caregiver support, Home environment  access/layout, Lack of/limited family support, Insurance for SNF coverage    OT Decreased caregiver support, Home environment access/layout, Wound Care wife in w/c, 2 steps to enter  [place] for SNF coverage, Lack of/limited family support, Inaccessible home environment, Decreased caregiver support, Home environment access/layout     Team Discharge Planning: Destination: PT-Home ,OT- Home , SLP-Home Projected Follow-up: PT-Outpatient  PT, Home health PT, OT-  Outpatient OT, SLP-Outpatient SLP Projected Equipment Needs: PT-To be determined, OT-  , SLP-None recommended by SLP Equipment Details: PT- , OT-pt has all needed DME, issued a RW splint for L hand Patient/family involved in discharge planning: PT- Patient,  OT-Patient, SLP-Patient  MD ELOS: 10-12d Medical Rehab Prognosis:  Good Assessment: The patient has been admitted for CIR therapies with the diagnosis of RIght MCA infarct. The team will be addressing functional mobility, strength, stamina, balance, safety, adaptive techniques and equipment, self-care, bowel and bladder mgt, patient and caregiver education, orthotic management , BP and DM management. Goals have been set at Sup. Anticipated discharge destination is Home. See Team Conference Notes for weekly updates to the plan of care

## 2022-05-10 NOTE — Progress Notes (Signed)
Physical Therapy Session Note  Patient Details  Name: Joseph Ortega MRN: [id number] Date of Birth: 11/26/1955  Today's Date: 05/10/2022 PT Individual Time: 1320-1408 and [id number] PT Individual Time Calculation (min): 48 min and 12 min And  Today's Date: 05/10/2022 PT Missed Time: 48 Minutes Missed Time Reason: Patient fatigue  Short Term Goals: Week 1:  PT Short Term Goal 1 (Week 1): Pt will perform sit<>stand with CGA using RW. PT Short Term Goal 2 (Week 1): pt will perform stand pivot to/ from recliner with CGA/ MinA using RW PT Short Term Goal 3 (Week 1): Pt will perform dynamic standing/ balance activities with no AD and MinA. PT Short Term Goal 4 (Week 1): pt will ambulate longer distances using RW and improved LLE foot clearance. PT Short Term Goal 5 (Week 1): Pt will demonstrate decreased hip hike during backward step with LLE.  Skilled Therapeutic Interventions/Progress Updates:    Session 1: Pt received supine in bed, awake and reporting need to use bathroom. Agreeable to therapy session. Supine>sitting R EOB, HOB partially elevated, with supervision and increased time. Sitting EOB donned tennis shoes with L LE [place] Walk-on PLS AFO total assist for time management.   Sit>stand partially elevated EOB>RW with light min assist due to urgency. Gait training ~52f x2 in/out bathroom using RW with CGA for steadying/safety - pt continues to have decreased L hip stability during stance as well as slow gait speed and short step lengths.  Sit<>stand RW<>BSC over toilet with CGA descent and min assist for rising with pt having increased difficulty coming to stand. Pt reports he has a raised toilet seat at home - therapist increased height of BSC and provided pt with BSC funnel. Continent of bowels (pt flushed before seeing) - pericare in standing with CGA for stability while using LUE support on RW. Standing hand hygiene at sink with education on stepping up close to sink to allow him to  support himself with hips on the sink for safety then allowing him to unstrap his L hand to incorporate it into the task for NMR. Pt reports he avoids doing bimanual tasks at home that require him to let go with both hands because he knows he doesn't have someone to guard him if he starts to have LOB - notified OT of this to also address.  Gait training ~1584ffrom his room to main therapy gym using +2 R HHA and L UE support over therapist's shoulders with +2 min assist for balance. Pt demonstrating the following gait deviations with therapist providing the described cuing and facilitation for improvement:  - continues to have L hip instability during stance with lack of hip abductor activation causing minor hip drop as well as poor forward progression of L pelvis throughout stance to allow sufficient R step length  - narrow BOS requiring verbal/tactile cuing to abduct L LE during swing - short step lengths bilaterally as described above  Sit<>supine on mat table with min assist to lift L LE when lying and then min assist to bring trunk upright when returning to sit.  Supine bridging on mat focusing on L LE glute and hip musculature activation 2x10reps - 1st set focused on improved L glute activation to improve knee alignment because L hip would drop and L knee would fall out to side - 2nd set focused on maintaining improved form while powering up quickly during concentric activation then slowly lowering eccentrically--- pt reports this exercise really fatigued his LEs  L stand pivot EOB>w/c,  HHA on therapist, with min assist for balance. At end of session, pt left seated in w/c with needs in reach and seat belt alarm on. Doffed AFO at end of session for pt comfort as he will need larger shoes.  Session 2: Pt received sitting in w/c reporting significant fatigue, stating he feels if he tries to do any more he is going to fall. Pt agreeable to education on plan for future gait training in litegait in  order to provide pt safety/support to further challenge his gait. Transported down to gym and therapist provided visual demonstration of how to step-up litegait and ideas of gait training in it during future PT sessions with limited UE support. Pt agreeable to this plan. Transported back to his room and pt left seated in w/c, per his request, with needs in reach and seat belt alarm on. Missed 48 minutes of skilled physical therapy due to fatigue.  Therapy Documentation Precautions:  Precautions Precautions: Fall Precaution Comments: L sided weakness; BP <150 Restrictions Weight Bearing Restrictions: No Other Position/Activity Restrictions: Needs Loop Monitor cardiac device wiht him in therapy   Pain: Session 1: No reports of pain throughout session.  Session 2: No reports of pain throughout session.   Therapy/Group: Individual Therapy  [name] , PT, DPT, NCS, CSRS 05/10/2022, 8:08 AM

## 2022-05-10 NOTE — Progress Notes (Signed)
PROGRESS NOTE   Subjective/Complaints:  No issues overnite  ROS- denies CP, SOB, N/V/D Objective:   No results found. Recent Labs    05/07/22 2223 05/08/22 0536  WBC 8.1 6.1  HGB 15.4 13.4  HCT 43.7 39.3  PLT 216 181    Recent Labs    05/07/22 2223 05/08/22 0536  NA  --  136  K  --  3.3*  CL  --  98  CO2  --  30  GLUCOSE  --  244*  BUN  --  17  CREATININE 1.36* 1.14  CALCIUM  --  9.0     Intake/Output Summary (Last 24 hours) at 05/10/2022 0908 Last data filed at 05/10/2022 0804 Gross per 24 hour  Intake 880 ml  Output 1325 ml  Net -445 ml         Physical Exam: Vital Signs Blood pressure (!) 155/72, pulse 65, temperature 97.6 F (36.4 C), temperature source Oral, resp. rate 17, weight 110.2 kg, SpO2 99 %.   General: No acute distress Mood and affect are appropriate Heart: Regular rate and rhythm no rubs murmurs or extra sounds Lungs: Clear to auscultation, breathing unlabored, no rales or wheezes Abdomen: Positive bowel sounds, soft nontender to palpation, nondistended Extremities: No clubbing, cyanosis, or edema Skin: No evidence of breakdown, no evidence of rash Neurologic: Cranial nerves II through XII intact, motor strength is 5/5 in right and 3- left deltoid, bicep, tricep, grip, 5/5 right and 4-/5 left hip flexor, knee extensors, ankle dorsiflexor and plantar flexor Sensory exam normal sensation to light touch and proprioception in bilateral upper and lower extremities Cerebellar exam deferred due to weakness  Musculoskeletal: Full range of motion in all 4 extremities. No joint swelling   Assessment/Plan: 1. Functional deficits which require 3+ hours per day of interdisciplinary therapy in a comprehensive inpatient rehab setting. Physiatrist is providing close team supervision and 24 hour management of active medical problems listed below. Physiatrist and rehab team continue to assess  barriers to discharge/monitor patient progress toward functional and medical goals  Care Tool:  Bathing    Body parts bathed by patient: Left arm, Chest, Abdomen, Front perineal area, Right upper leg, Left upper leg, Face   Body parts bathed by helper: Right arm, Buttocks, Left lower leg, Right lower leg     Bathing assist Assist Level: Moderate Assistance - Patient 50 - 74%     Upper Body Dressing/Undressing Upper body dressing   What is the patient wearing?: Pull over shirt    Upper body assist Assist Level: Moderate Assistance - Patient 50 - 74%    Lower Body Dressing/Undressing Lower body dressing      What is the patient wearing?: Underwear/pull up, Pants     Lower body assist Assist for lower body dressing: Maximal Assistance - Patient 25 - 49%     Toileting Toileting    Toileting assist Assist for toileting: Moderate Assistance - Patient 50 - 74%     Transfers Chair/bed transfer  Transfers assist     Chair/bed transfer assist level: Moderate Assistance - Patient 50 - 74%     Locomotion Ambulation   Ambulation assist  Assist level: Minimal Assistance - Patient > 75% Assistive device: Walker-rolling Max distance: 200 ft   Walk 10 feet activity   Assist     Assist level: Moderate Assistance - Patient - 50 - 74% Assistive device: No Device   Walk 50 feet activity   Assist    Assist level: Minimal Assistance - Patient > 75% Assistive device: Walker-rolling    Walk 150 feet activity   Assist    Assist level: Minimal Assistance - Patient > 75% Assistive device: Walker-rolling    Walk 10 feet on uneven surface  activity   Assist Walk 10 feet on uneven surfaces activity did not occur: Safety/medical concerns         Wheelchair     Assist Is the patient using a wheelchair?: Yes Type of Wheelchair: Manual    Wheelchair assist level: Maximal Assistance - Patient 25 - 49% Max wheelchair distance: 60 ft     Wheelchair 50 feet with 2 turns activity    Assist        Assist Level: Maximal Assistance - Patient 25 - 49%   Wheelchair 150 feet activity     Assist      Assist Level: Total Assistance - Patient < 25%   Blood pressure (!) 155/72, pulse 65, temperature 97.6 F (36.4 C), temperature source Oral, resp. rate 17, weight 110.2 kg, SpO2 99 %.  Medical Problem List and Plan: 1. 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             -patient may  shower             -ELOS/Goals: 10-12d Supervision  2.  Antithrombotics: -DVT/anticoagulation:  Pharmaceutical: Heparin- switch to lovenox , no renal disease              -antiplatelet therapy: Aspirin 81 mg daily resumed on 05/07/2022 3. Pain Management: Topamax 50 mg daily for headache 4. Mood/Behavior/Sleep: Provide emotional support             -antipsychotic agents: N/A 5. Neuropsych/cognition: This patient is capable of making decisions on his own behalf. 6. Skin/Wound Care: Routine skin checks 7. Fluids/Electrolytes/Nutrition: Routine in and outs with follow-up chemistries hypoK+ on HCTZ start daily KCL, recheck on 2/23 8.  Hypothyroidism.  Synthroid 9.  Diabetes mellitus.  Hemoglobin A1c 8.3.  Currently on SSI.  Patient was on Levemir 39 units twice daily prior to admission as well as Farxiga 10 mg daily and Trulicity 3 mg every Thursday.  Resume as needed\ CBG (last 3)  Recent Labs    05/09/22 1627 05/09/22 2015 05/10/22 0557  GLUCAP 194* 307* 277*   Restart levimir, 15U qhs 2/20, increase to 25U on 2/21, increase to 35 U on 2/22   10.  Hypertension.  Coreg 12.5 mg twice daily, HCTZ 25 mg daily.  Monitor with increased mobility Vitals:   05/09/22 1938 05/10/22 0555  BP: 128/63 (!) 155/72  Pulse: 65 65  Resp: 18 17  Temp: (!) 97.5 F (36.4 C) 97.6 F (36.4 C)  SpO2: 97% 99%  Some lability will monitor prior to dosage change   11.   Hyperlipidemia.  Crestor 12.  Urinary incontinence  will try to wean off condom cath     LOS: 3 days A FACE TO FACE EVALUATION WAS PERFORMED  Charlett Blake 05/10/2022, 9:08 AM

## 2022-05-11 LAB — GLUCOSE, CAPILLARY
Glucose-Capillary: 260 mg/dL — ABNORMAL HIGH (ref 70–99)
Glucose-Capillary: 201 mg/dL — ABNORMAL HIGH (ref 70–99)
Glucose-Capillary: 208 mg/dL — ABNORMAL HIGH (ref 70–99)
Glucose-Capillary: 330 mg/dL — ABNORMAL HIGH (ref 70–99)

## 2022-05-11 MED ORDER — INSULIN DETEMIR 100 UNIT/ML ~~LOC~~ SOLN
35.0000 [IU] | Freq: Every day | SUBCUTANEOUS | Status: DC
  Administered 2022-05-11: 35 [IU] via SUBCUTANEOUS
  Filled 2022-05-11 (×2): qty 0.35

## 2022-05-11 NOTE — Progress Notes (Signed)
Occupational Therapy Session Note  Patient Details  Name: Joseph Ortega MRN: [id number] Date of Birth: 1955-06-17  Today's Date: 05/11/2022 OT Individual Time: 1003-1100 OT Individual Time Calculation (min): 57 min  OT Individual Time: [id number] OT Individual Time Calculation (min): 74 min   Short Term Goals: Week 1:  OT Short Term Goal 1 (Week 1): Complete UB self care with S using hemi techniques OT Short Term Goal 2 (Week 1): Complete LB self care using hemi techniques with CGA OT Short Term Goal 3 (Week 1): Amb for toilet transfer with RW with CGA OT Short Term Goal 4 (Week 1): Pt will stand for oral care or kitchen sink task for up to 5 min with CGA  Skilled Therapeutic Interventions/Progress Updates:     AM Session Pt received sitting up in wc presenting to being good spirits and receptive to skilled OT session with a focus on BADL retraining. Pt reporting 0/10 pain during today's session. Pt LLE presenting to be mildly increased in swelling, although no pain reported. Rn notified. Pt requesting to take shower during today's session.  Pt sit>stand CGA +time with min cueing for technique. Pt ambulated to bathroom CGA with tactile cues provided for lateral weight shifting and VB cueing for step length. Transferred to tub bench using grab bars CGA with mod cuing for technique. Pt able to maintain dynamic sitting balance sitting on tub bench throughout shower with supervision to CGA when washing feet. Pt provided long handled sponge and was receptive to use utilizing for LB bathing and to wash back with supervision. Pt encouraged to utilize LUE as much as possible throughout tasks with skilled education provided on self guided HOH A with Pt demonstrating teach back as evidence of learning. Pt able to use LUE as gross assist to wash face, RUE, chest, and abdomen. Pt sit>stand using grab bars CGA to wash bottom and anterior peri area. Discussed Pt home shower set-up following shower with Pt  reporting his shower is equip with grab bars,  [place], and tub bench. Following shower, Pt completed dressing tasks seated EOB. Pt educated on U/LB hemi dressing technique with demonstration and practice provided. Pt required mod VB cueing to orient shirt, however able to don shirt with supervision and max cueing following. Pt donned pants CGA leaning forward to weave feet and standing to bring pants to waist. Pt fatigued following BADL retraining session requesting to return to bed. Pt min A to lift legs into bed with HOB elevated. Pt was left resting in bed with call bell in reach, bed alarm on, and all needs met.  PM Session:  Pt received sitting up in bed presenting to be in good spirits and receptive to skilled OT session. Focus this session IADL retraining. Pt transitioned to EOB using bed features CGA. Pt able to donn shoes EOB with set-up assist. Stand step EOB>wc using RW CGA +time. Pt propelled wc ~75 feet utilizing BUEs and BLEs for endurance and functional mobility training. Pt able to complete with increased time, 2 short rest breaks, and mod motivation. Pt transported remainder of the way to ADL apartment total A for time management and energy conservation.  Pt and OT discussed home set-up and Pt responsibilities in completing kitchen tasks. Pt reporting he is fearful of not using RW in kitchen d/t fear of falling. Educated Pt on strategies to increase safety such as having a chair/wc close by, using stable surfaces such as counter tops to balance, and taking frequent rest breaks when need  for energy conservation. Pt completed kitchen mobility task of gathering items and transporting down counter top with emphasis on safety and functional use of LUE. Pt sit>stand CGA using counter top to balance, Pt completed side steps to R ~15 feet during task with CGA and mod cueing for safety. Pt utilize LUE to reach for light weight fruits to place in large bowl with OT providing VB and tactile cueing to  facilitate external rotation at shoulder/supination at forearm to improve functional reaching pattern. Pt able to complete task without RW usage and stating he felt safe balancing with single UE supported on counter top at all times. Rest break provided following task.  Pt then completed simulated kitchen task of putting away dishes using LUE to place light weight cups into cabinet at eye level. Pt required mod tactile/VB cueing to utilize functional reaching pattern vs. Compensating for decreased UE strength with trunk movements and shoulder elevation. Pt presenting with improved reaching following education requiring intermittent min A at elbow/wrist to facilitate full grasp/release with supination at forearm. Rest break following.  Pt transported back to room total A in wc for energy conservation d/t fatigue.  Pt completed laundry tasks gather clothing in room using RW CGA to ambulate with OT carrying bag. Pt reporting he has a basket on his walker at home that he uses to transport laundry. (Spoke with PT at end of session to plan washing laundry during upcoming PT session today as time allows with PT receptive to idea).  Pt was left resting in wc with call bell in reach, seat belt alarm on, and all needs met.   Therapy Documentation Precautions:  Precautions Precautions: Fall Precaution Comments: L sided weakness; BP <150 Restrictions Weight Bearing Restrictions: No Other Position/Activity Restrictions: Needs Loop Monitor cardiac device wiht him in therapy General:   Vital Signs: Therapy Vitals Temp: 97.7 F (36.5 C) Pulse Rate: 66 Resp: 15 BP: (Abnormal) 140/65 Patient Position (if appropriate): Lying Oxygen Therapy SpO2: 96 % O2 Device: Room Air Pain:   ADL: ADL Equipment Provided: Long-handled sponge Eating: Set up Where Assessed-Eating: Bed level Grooming: Moderate assistance Where Assessed-Grooming: Sitting at sink Upper Body Bathing: Moderate assistance Where  Assessed-Upper Body Bathing: Sitting at sink Lower Body Bathing: Maximal assistance Where Assessed-Lower Body Bathing: Sitting at sink, Standing at sink Upper Body Dressing: Moderate assistance Where Assessed-Upper Body Dressing: Sitting at sink Lower Body Dressing: Maximal assistance Where Assessed-Lower Body Dressing: Sitting at sink, Standing at sink Toileting: Minimal assistance Where Assessed-Toileting: Glass blower/designer: Moderate assistance Toilet Transfer Method: Counselling psychologist: Bedside commode, Energy manager: Maximal Firefighter Method: Radiographer, therapeutic: Radio broadcast assistant ADL Comments: min A amb with RW with limited L grasp- now with L walker splint, min A UB self care, mod a LB self care pull on clothing    Therapy/Group: Individual Therapy  Janey Genta 05/11/2022, 7:57 AM

## 2022-05-11 NOTE — Progress Notes (Signed)
PROGRESS NOTE   Subjective/Complaints:  No issues overnite   ROS- denies CP, SOB, N/V/D Objective:   No results found. No results for input(s): "WBC", "HGB", "HCT", "PLT" in the last 72 hours.  No results for input(s): "NA", "K", "CL", "CO2", "GLUCOSE", "BUN", "CREATININE", "CALCIUM" in the last 72 hours.   Intake/Output Summary (Last 24 hours) at 05/11/2022 0837 Last data filed at 05/11/2022 0534 Gross per 24 hour  Intake 580 ml  Output 1400 ml  Net -820 ml         Physical Exam: Vital Signs Blood pressure (!) 140/65, pulse 66, temperature 97.7 F (36.5 C), resp. rate 15, weight 110.2 kg, SpO2 96 %.   General: No acute distress Mood and affect are appropriate Heart: Regular rate and rhythm no rubs murmurs or extra sounds Lungs: Clear to auscultation, breathing unlabored, no rales or wheezes Abdomen: Positive bowel sounds, soft nontender to palpation, nondistended Extremities: No clubbing, cyanosis, or edema Skin: No evidence of breakdown, no evidence of rash Neurologic: Cranial nerves II through XII intact, motor strength is 5/5 in right and 3- left deltoid, bicep, tricep, grip, 5/5 right and 4-/5 left hip flexor, knee extensors, ankle dorsiflexor and plantar flexor  Cerebellar exam deferred due to weakness  Musculoskeletal: Full range of motion in all 4 extremities. No joint swelling   Assessment/Plan: 1. Functional deficits which require 3+ hours per day of interdisciplinary therapy in a comprehensive inpatient rehab setting. Physiatrist is providing close team supervision and 24 hour management of active medical problems listed below. Physiatrist and rehab team continue to assess barriers to discharge/monitor patient progress toward functional and medical goals  Care Tool:  Bathing    Body parts bathed by patient: Left arm, Chest, Abdomen, Front perineal area, Right upper leg, Left upper leg, Face    Body parts bathed by helper: Right arm, Buttocks, Left lower leg, Right lower leg     Bathing assist Assist Level: Moderate Assistance - Patient 50 - 74%     Upper Body Dressing/Undressing Upper body dressing   What is the patient wearing?: Pull over shirt    Upper body assist Assist Level: Moderate Assistance - Patient 50 - 74%    Lower Body Dressing/Undressing Lower body dressing      What is the patient wearing?: Underwear/pull up, Pants     Lower body assist Assist for lower body dressing: Maximal Assistance - Patient 25 - 49%     Toileting Toileting    Toileting assist Assist for toileting: Moderate Assistance - Patient 50 - 74%     Transfers Chair/bed transfer  Transfers assist     Chair/bed transfer assist level: Moderate Assistance - Patient 50 - 74%     Locomotion Ambulation   Ambulation assist      Assist level: Minimal Assistance - Patient > 75% Assistive device: Walker-rolling Max distance: 200 ft   Walk 10 feet activity   Assist     Assist level: Moderate Assistance - Patient - 50 - 74% Assistive device: No Device   Walk 50 feet activity   Assist    Assist level: Minimal Assistance - Patient > 75% Assistive device: Walker-rolling  Walk 150 feet activity   Assist    Assist level: Minimal Assistance - Patient > 75% Assistive device: Walker-rolling    Walk 10 feet on uneven surface  activity   Assist Walk 10 feet on uneven surfaces activity did not occur: Safety/medical concerns         Wheelchair     Assist Is the patient using a wheelchair?: Yes Type of Wheelchair: Manual    Wheelchair assist level: Maximal Assistance - Patient 25 - 49% Max wheelchair distance: 60 ft    Wheelchair 50 feet with 2 turns activity    Assist        Assist Level: Maximal Assistance - Patient 25 - 49%   Wheelchair 150 feet activity     Assist      Assist Level: Total Assistance - Patient < 25%   Blood  pressure (!) 140/65, pulse 66, temperature 97.7 F (36.5 C), resp. rate 15, weight 110.2 kg, SpO2 96 %.  Medical Problem List and Plan: 1. 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             -patient may  shower             -ELOS/Goals: 10-12d Supervision  2.  Antithrombotics: -DVT/anticoagulation:  Pharmaceutical: Heparin- switch to lovenox , no renal disease              -antiplatelet therapy: Aspirin 81 mg daily resumed on 05/07/2022 3. Pain Management: Topamax 50 mg daily for headache 4. Mood/Behavior/Sleep: Provide emotional support             -antipsychotic agents: N/A 5. Neuropsych/cognition: This patient is capable of making decisions on his own behalf. 6. Skin/Wound Care: Routine skin checks 7. Fluids/Electrolytes/Nutrition: Routine in and outs with follow-up chemistries hypoK+ on HCTZ start daily KCL, recheck on 2/26 8.  Hypothyroidism.  Synthroid 9.  Diabetes mellitus.  Hemoglobin A1c 8.3.  Currently on SSI.  Patient was on Levemir 39 units twice daily prior to admission as well as Farxiga 10 mg daily and Trulicity 3 mg every Thursday.  Resume as needed\ CBG (last 3)  Recent Labs    05/10/22 1636 05/10/22 2056 05/11/22 0542  GLUCAP 255* 276* 260*   Restart levimir, 15U qhs 2/20, increase to 25U on 2/21, increase to 35 U on 2/23  10.  Hypertension.  Coreg 12.5 mg twice daily, HCTZ 25 mg daily.  Monitor with increased mobility Vitals:   05/10/22 1953 05/11/22 0539  BP: 125/68 (!) 140/65  Pulse: 70 66  Resp: 18 15  Temp: 98.4 F (36.9 C) 97.7 F (36.5 C)  SpO2: 98% 96%  Improving 05/11/22  11.  Hyperlipidemia.  Crestor 12.  Urinary incontinence  will try to wean off condom cath     LOS: 4 days A FACE TO FACE EVALUATION WAS PERFORMED  Charlett Blake 05/11/2022, 8:37 AM

## 2022-05-11 NOTE — Progress Notes (Signed)
Physical Therapy Session Note  Patient Details  Name: Joseph Ortega MRN: [id number] Date of Birth: February 08, 1956  Today's Date: 05/11/2022 PT Individual Time: [id number] PT Individual Time Calculation (min): 61 min   Short Term Goals: Week 1:  PT Short Term Goal 1 (Week 1): Pt will perform sit<>stand with CGA using RW. PT Short Term Goal 2 (Week 1): pt will perform stand pivot to/ from recliner with CGA/ MinA using RW PT Short Term Goal 3 (Week 1): Pt will perform dynamic standing/ balance activities with no AD and MinA. PT Short Term Goal 4 (Week 1): pt will ambulate longer distances using RW and improved LLE foot clearance. PT Short Term Goal 5 (Week 1): Pt will demonstrate decreased hip hike during backward step with LLE.  Skilled Therapeutic Interventions/Progress Updates:    Pt received sitting upright in wc and agreeable to PT. Pt reported a 3/10 hamstring pain in BLE. Transported patient dependently in wc to laundry room. Pt ambulated into laundry room w/ Min A. Pt was able to stand and put clothes into the washer machine. Reaching for clothes to the R was Min A, for postural control when coming back to midline. Pt was then transported to the dayroom gym dependently and therapist performed a passive seated hamstring stretch x10 seconds to assess hamstring tightness on each LE. Pt expressed minor decrease in discomfort. Pt performed side steps at the wall HR w/ CGA. 2x2 down and back. Therapist facilitating L anterior pelvic rotation to cue pt to rotate L hip forward. Gait training Min A 1x60 ft using RW w/o AFO. Pt and therapist noted L foot slap and decreased DF. Therapist donned AFO in sitting and remained on throughout gait. ~32fx1 and ~1531f1 w/ AFO and RW, decreased foot slap and increased L foot DF were noted. Pt was able to demonstrate an improved heel toe pattern. Therapist providing Min A for R wt shift to increase L foot clearance. Therapist removed pts AFO per request.   Sit to stand  transfer performed throughout session were MinA w/ therapist assisting in power up. By end of session pt denied any hamstring pain. Pt was transported back to room dependently, [facility] on, call bell in reach and all needs met.   Therapy Documentation Precautions:  Precautions Precautions: Fall Precaution Comments: L sided weakness; BP <150 Restrictions Weight Bearing Restrictions: No Other Position/Activity Restrictions: Needs Loop Monitor cardiac device wiht him in therapy General:  Pain: 3/10 pain in B hamstrings         Therapy/Group: Individual Therapy  Mekayla Soman 05/11/2022, 5:19 PM

## 2022-05-12 LAB — GLUCOSE, CAPILLARY
Glucose-Capillary: 272 mg/dL — ABNORMAL HIGH (ref 70–99)
Glucose-Capillary: 221 mg/dL — ABNORMAL HIGH (ref 70–99)
Glucose-Capillary: 267 mg/dL — ABNORMAL HIGH (ref 70–99)
Glucose-Capillary: 287 mg/dL — ABNORMAL HIGH (ref 70–99)

## 2022-05-12 MED ORDER — INSULIN DETEMIR 100 UNIT/ML ~~LOC~~ SOLN
40.0000 [IU] | Freq: Every day | SUBCUTANEOUS | Status: DC
  Filled 2022-05-12: qty 0.4

## 2022-05-12 MED ORDER — INSULIN DETEMIR 100 UNIT/ML ~~LOC~~ SOLN
45.0000 [IU] | Freq: Every day | SUBCUTANEOUS | Status: DC
  Administered 2022-05-12: 45 [IU] via SUBCUTANEOUS
  Filled 2022-05-12 (×2): qty 0.45

## 2022-05-12 NOTE — Progress Notes (Signed)
Physical Therapy Session Note  Patient Details  Name: Joseph Ortega MRN: [id number] Date of Birth: 07-18-1955  Today's Date: 05/12/2022 PT Individual Time: 1050-1201 PT Individual Time Calculation (min): 71 min   Short Term Goals: Week 1:  PT Short Term Goal 1 (Week 1): Pt will perform sit<>stand with CGA using RW. PT Short Term Goal 2 (Week 1): pt will perform stand pivot to/ from recliner with CGA/ MinA using RW PT Short Term Goal 3 (Week 1): Pt will perform dynamic standing/ balance activities with no AD and MinA. PT Short Term Goal 4 (Week 1): pt will ambulate longer distances using RW and improved LLE foot clearance. PT Short Term Goal 5 (Week 1): Pt will demonstrate decreased hip hike during backward step with LLE.  Skilled Therapeutic Interventions/Progress Updates:    Pt received supine in bed and agreeable to therapy session. Reports his clothes are in the dryer - therapist retrieved for him. Supine>sitting R EOB, HOB elevated but not using bedrail, with supervision and increased time/effort. Sitting EOB threaded on shorts with increased time/effort - cuing to thread on correctly for decreased assist. Donned B LE shoes and L LE [place] Walk-on PLS AFO total assist for time management.  Sit>stand EOB>RW with CGA for safety but no LOB while pt pulled pants up over hips without assist.  Gait training 138f to dayroom using R HHA from +2 assist and L UE support around therapist's shoulders - +2 min assist for balance - continues to start with narrow BOS, small step lengths lacking forward progression of L pelvis over L stance phase resulting in decreased R LE step length, continues to have decreased L hip stability during stance - therapist providing verbal/tactile cuing throughout for improvement as well as manual facilitation for improved L hip alignment during stance - cuing towards end of gait distance to achieve larger step lengths bilaterally, to be more symmetrical with pt's  height.  Donned litegait harness in standing with B UE support on litegait handles and close supervision for balance.  Gait training 3595f+ ~17860fsing litegiat harness with +2 assist for managing litegait as needed - started with having B UE support on litegait and pt in charge of pushing litegait forward to target forward propulsion of gait - pt demos improved step lengths - continued manual facilitation for improved L hip alignment during stance - progressed to only L UE support over therapist's shoulders but no R UE support during 2nd half of 1st walk and entire 2nd walk: pt initially reverts back to very narrow BOS (lacking sufficient L hip abductor activation), worse L hip stability during stance phase requiring increased facilitation to decrease hip drop and promote increased glute activation with forward advancement of pelvis over stance limb, pt with fear of falling frequently grabbing onto litegait handle with R UE initially but improved during 2nd gait with pt able to make it the entire distance without R UE support but does requires mod assist for improved L stance hip positioning - with fatigue, would start to hinge forward at hips therefore provided black bungee cord support at L hip with significant improvement in L hip alignment and forward progression of pelvis over L stance Vitals after 1st walk: HR 70bpm and SpO2 98%   Doffed harness as described above. Transported back to his room and pt left seated in w/c with needs in reach and seat belt alarm on.    Therapy Documentation Precautions:  Precautions Precautions: Fall Precaution Comments: L sided weakness; BP <150  Restrictions Weight Bearing Restrictions: No Other Position/Activity Restrictions: Needs Loop Monitor cardiac device wiht him in therapy   Pain:  Denies pain during session. Pt frequently grunting with increased effort but repeatedly denies pain.     Therapy/Group: Individual Therapy  [name] , PT,  DPT, NCS, CSRS 05/12/2022, 8:05 AM

## 2022-05-12 NOTE — Progress Notes (Signed)
Occupational Therapy Session Note  Patient Details  Name: Joseph Ortega MRN: [id number] Date of Birth: [birth year]  Today's Date: 05/12/2022 OT Individual Time: 1415-1530 OT Individual Time Calculation (min): 75 min    Short Term Goals: Week 1:  OT Short Term Goal 1 (Week 1): Complete UB self care with S using hemi techniques OT Short Term Goal 2 (Week 1): Complete LB self care using hemi techniques with CGA OT Short Term Goal 3 (Week 1): Amb for toilet transfer with RW with CGA OT Short Term Goal 4 (Week 1): Pt will stand for oral care or kitchen sink task for up to 5 min with CGA  Skilled Therapeutic Interventions/Progress Updates:     Pt received sitting up in wc presenting to being good spirits and receptive to skilled OT session. Pt reporting mild pain in stomach- repositioning and rest breaks provided PRN. Focus this session functional mobility training, RUE NMR, and self-care re-training.  Focused beginning of session on problem solving donning LLE shoe with AFO. Pt provided demonstration on set-up for AFO with Pt able to complete following with min A for first trial and supervision with mod cueing second trial. Pt and OT discussed strategies for donning shoe with AFO discussing anterior leaning vs. Crossing leg into figure 4 position vs. Propping leg on bed. Pt practiced donning shoe in figure 4 position, however requiring mod A d/t Pt unable to maintain LLE in figure-four position without assist for prolonged period of time. Pt then practiced donning with anterior leaning, however also requiring mod A to correctly position foot in shoe with maximal effort given and increased time. Pt able to don 1 out of 2 velcro straps on AFO and shoes with maximal effort given and maximal cueing for technique. Pt reporting his wife is unable to assist him with donning AFO d/t FM deficits. Will continue to practice and problem solving during Pt stay.   Pt transported total A to therapy gym for time  management and energy conservation. Pt positioned at table in wc. Pt completed variety of LUE NMR activities to support increased muscle activation, ROM, and decreased tone. Pt completed the following tasks: -Shoulder protraction/retraction with LUE placed on wash cloth with OT facilitating functional movement pattern -Shoulder circumduction with elbow flexion/extension with hand on wash cloth -BUE progressive hand "walks" up gait belt to facilitate grasp/release and controled shoulder flexion -Shoulder external/internal rotation with AAROM  Pt completed functional mobility task with functional reaching and dynamic standing balance incorporated to simulate home cleaning activities. Pt able to ambulate within therapy gym using RW with L hand splint CGA to light min A with fatigue. Pt maintaining dynamic standing balance with RUE supported on RW while reaching outside BOS to retrieve bean bags x10 with OT facilitating external rotation through tactile cueing and UE positioning with mod verbal cues provided for breathing with movements.   Pt transported back to room total A in wc for energy conservation. Pt completed functional mobility in room using RW ambulating to bed CGA. Pt EOB>supine supervision using bed features. Pt left resting in bed with call bell in reach, bed alarm on, and all needs met.    Therapy Documentation Precautions:  Precautions Precautions: Fall Precaution Comments: L sided weakness; BP <150 Restrictions Weight Bearing Restrictions: No Other Position/Activity Restrictions: Needs Loop Monitor cardiac device wiht him in therapy General:   Vital Signs: Therapy Vitals Temp: 98.5 F (36.9 C) Pulse Rate: 64 Resp: 17 BP: 121/62 Patient Position (if appropriate): Sitting Oxygen Therapy SpO2:  97 % O2 Device: Room Air Pain:   ADL: ADL Equipment Provided: Long-handled sponge Eating: Set up Where Assessed-Eating: Bed level Grooming: Moderate assistance Where  Assessed-Grooming: Sitting at sink Upper Body Bathing: Moderate assistance Where Assessed-Upper Body Bathing: Sitting at sink Lower Body Bathing: Maximal assistance Where Assessed-Lower Body Bathing: Sitting at sink, Standing at sink Upper Body Dressing: Moderate assistance Where Assessed-Upper Body Dressing: Sitting at sink Lower Body Dressing: Maximal assistance Where Assessed-Lower Body Dressing: Sitting at sink, Standing at sink Toileting: Minimal assistance Where Assessed-Toileting: Glass blower/designer: Moderate assistance Toilet Transfer Method: Counselling psychologist: Bedside commode, Energy manager: Maximal Firefighter Method: Radiographer, therapeutic: Radio broadcast assistant ADL Comments: min A amb with RW with limited L grasp- now with L walker splint, min A UB self care, mod a LB self care pull on clothing Vision   Perception    Praxis   Balance   Exercises:   Other Treatments:     Therapy/Group: Individual Therapy  Janey Genta 05/12/2022, 3:02 PM

## 2022-05-12 NOTE — Progress Notes (Signed)
PROGRESS NOTE   Subjective/Complaints:  No acute complaints.  No events overnight.  Blood glucose remains elevated in 300s, on questioning patient states he takes weekly Trulicity shots along with 55 units of Levemir daily.   ROS- denies CP, SOB, N/V/D Objective:   No results found. No results for input(s): "WBC", "HGB", "HCT", "PLT" in the last 72 hours.  No results for input(s): "NA", "K", "CL", "CO2", "GLUCOSE", "BUN", "CREATININE", "CALCIUM" in the last 72 hours.   Intake/Output Summary (Last 24 hours) at 05/12/2022 1510 Last data filed at 05/12/2022 1300 Gross per 24 hour  Intake 540 ml  Output 800 ml  Net -260 ml         Physical Exam: Vital Signs Blood pressure 121/62, pulse 64, temperature 98.5 F (36.9 C), resp. rate 17, weight 110.2 kg, SpO2 97 %.   General: No acute distress Mood and affect are appropriate Heart: Regular rate and rhythm no rubs murmurs or extra sounds Lungs: Clear to auscultation, breathing unlabored, no rales or wheezes Abdomen: Positive bowel sounds, soft nontender to palpation, nondistended Extremities: No clubbing, cyanosis, or edema Skin: No evidence of breakdown, no evidence of rash Neurologic: Cranial nerves II through XII intact, motor strength is 5/5 in right and 3- left deltoid, bicep, tricep, grip, 5/5 right and 4-/5 left hip flexor, knee extensors, ankle dorsiflexor and plantar flexor Sensory exam normal sensation to light touch and proprioception in bilateral upper and lower extremities Cerebellar exam deferred due to weakness  Musculoskeletal: Full range of motion in all 4 extremities. No joint swelling   Assessment/Plan: 1. Functional deficits which require 3+ hours per day of interdisciplinary therapy in a comprehensive inpatient rehab setting. Physiatrist is providing close team supervision and 24 hour management of active medical problems listed below. Physiatrist and  rehab team continue to assess barriers to discharge/monitor patient progress toward functional and medical goals  Care Tool:  Bathing    Body parts bathed by patient: Left arm, Chest, Abdomen, Front perineal area, Right upper leg, Left upper leg, Face   Body parts bathed by helper: Right arm, Buttocks, Left lower leg, Right lower leg     Bathing assist Assist Level: Moderate Assistance - Patient 50 - 74%     Upper Body Dressing/Undressing Upper body dressing   What is the patient wearing?: Pull over shirt    Upper body assist Assist Level: Moderate Assistance - Patient 50 - 74%    Lower Body Dressing/Undressing Lower body dressing      What is the patient wearing?: Underwear/pull up, Pants     Lower body assist Assist for lower body dressing: Maximal Assistance - Patient 25 - 49%     Toileting Toileting    Toileting assist Assist for toileting: Moderate Assistance - Patient 50 - 74%     Transfers Chair/bed transfer  Transfers assist     Chair/bed transfer assist level: Moderate Assistance - Patient 50 - 74%     Locomotion Ambulation   Ambulation assist      Assist level: Minimal Assistance - Patient > 75% Assistive device: Walker-rolling Max distance: 200 ft   Walk 10 feet activity   Assist  Assist level: Moderate Assistance - Patient - 50 - 74% Assistive device: No Device   Walk 50 feet activity   Assist    Assist level: Minimal Assistance - Patient > 75% Assistive device: Walker-rolling    Walk 150 feet activity   Assist    Assist level: Minimal Assistance - Patient > 75% Assistive device: Walker-rolling    Walk 10 feet on uneven surface  activity   Assist Walk 10 feet on uneven surfaces activity did not occur: Safety/medical concerns         Wheelchair     Assist Is the patient using a wheelchair?: Yes Type of Wheelchair: Manual    Wheelchair assist level: Maximal Assistance - Patient 25 - 49% Max wheelchair  distance: 60 ft    Wheelchair 50 feet with 2 turns activity    Assist        Assist Level: Maximal Assistance - Patient 25 - 49%   Wheelchair 150 feet activity     Assist      Assist Level: Total Assistance - Patient < 25%   Blood pressure 121/62, pulse 64, temperature 98.5 F (36.9 C), resp. rate 17, weight 110.2 kg, SpO2 97 %.  Medical Problem List and Plan: 1. 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             -patient may  shower             -ELOS/Goals: 10-12d Supervision  2.  Antithrombotics: -DVT/anticoagulation:  Pharmaceutical: Heparin- switch to lovenox , no renal disease              -antiplatelet therapy: Aspirin 81 mg daily resumed on 05/07/2022 3. Pain Management: Topamax 50 mg daily for headache 4. Mood/Behavior/Sleep: Provide emotional support             -antipsychotic agents: N/A 5. Neuropsych/cognition: This patient is capable of making decisions on his own behalf. 6. Skin/Wound Care: Routine skin checks 7. Fluids/Electrolytes/Nutrition: Routine in and outs with follow-up chemistries hypoK+ on HCTZ start daily KCL, recheck on 2/23 8.  Hypothyroidism.  Synthroid 9.  Diabetes mellitus.  Hemoglobin A1c 8.3.  Currently on SSI.  Patient was on Levemir 39 units twice daily prior to admission as well as Farxiga 10 mg daily and Trulicity 3 mg every Thursday.  Resume as needed\ CBG (last 3)  Recent Labs    05/11/22 2112 05/12/22 0539 05/12/22 1204  GLUCAP 330* 272* 221*   Restart levimir, 15U qhs 2/20, increase to 25U on 2/21, increase to 35 U on 2/22, increased to 45 units on 2/24.  -Of note, patient states he was on 55 units of Levemir prior to admission.  10.  Hypertension.  Coreg 12.5 mg twice daily, HCTZ 25 mg daily.  Monitor with increased mobility Vitals:   05/12/22 0449 05/12/22 1402  BP: (!) 140/69 121/62  Pulse: 63 64  Resp: 16 17  Temp: 97.6 F (36.4 C) 98.5 F  (36.9 C)  SpO2: 98% 97%  Some lability will monitor prior to dosage change   11.  Hyperlipidemia.  Crestor 12.  Urinary incontinence  will try to wean off condom cath     LOS: 5 days A FACE TO FACE EVALUATION WAS PERFORMED  Gertie Gowda 05/12/2022, 3:10 PM

## 2022-05-12 NOTE — Progress Notes (Signed)
Speech Language Pathology Daily Session Note  Patient Details  Name: Joseph Ortega MRN: [id number] Date of Birth: August 29, 1955  Today's Date: 05/12/2022 SLP Individual Time: 0930-1015 SLP Individual Time Calculation (min): 45 min  Short Term Goals: Week 1: SLP Short Term Goal 1 (Week 1): Patient will recall and use memory compensations with min A verbal cues for effectiveness SLP Short Term Goal 2 (Week 1): Patient will complete complex problem solving with iADL tasks including medication and/or money management with min A verbal cues SLP Short Term Goal 3 (Week 1): Patient will complete scheduling and/or organization tasks with min A verbal cues  Skilled Therapeutic Interventions: Pt seen this date for skilled ST intervention targeting cognitive goals outlined above. Pt received awake/alert and lying semi-reclined in bed; sat EOB with assistance for time management. Agreeable to intervention at bedside. Participatory throughout.  Today's session with emphasis on cognitive skill training within the context of functional iADL related tasks (medication Civil engineer, contracting).  At the onset of today's session, pt recalled 7 out of 10 of his current medications, provided during last session, with Sup A and extended processing time; improved to 10 out of 10 given Min A verbal cues. Pt reports that visual support created last session was helpful in recalling medications. To further reinforce medication management, SLP provided skilled education re: safe medication management techniques at home, particularly as they relate to use of pill organizer (compensatory executive functioning strategies). Following orientation to pill organizer, safe medication techniques, and medication chart, pt completed BID pill organizer with overall Mod-Max A for problem-solving/executive functioning skills; faded cues to Min-Mod A as task progressed. Pt benefited from [place] A verbal cues to double check pill organizer after  filling for errors, which was noted x 3 (double dosed or missed dose entirely). Extended processing time was needed throughout task.  Additionally, pt benefited from [place] A verbal cues to demonstrate emergent awareness and need for ongoing assistance with medication management upon d/c (pt reports wife was completing this task for pt prior to admission). Recommended that pt start with organizing one medication, and then have his wife do the rest and building up from there with comfort + familiarity of task, but to always have supervision for safety; he verbalized understanded.   Will plan to continue medication management in upcoming session(s) to reinforce strategies learned and implemented during today's session.   Pt left in room and in bed with all safety measures activated and call bell within reach, and all immediate needs met. Continue per current ST POC.    Pain No pain reported; NAD  Therapy/Group: Individual Therapy  Tamber Burtch A Chaitanya Amedee 05/12/2022, 12:32 PM

## 2022-05-13 LAB — GLUCOSE, CAPILLARY
Glucose-Capillary: 195 mg/dL — ABNORMAL HIGH (ref 70–99)
Glucose-Capillary: 261 mg/dL — ABNORMAL HIGH (ref 70–99)
Glucose-Capillary: 220 mg/dL — ABNORMAL HIGH (ref 70–99)
Glucose-Capillary: 273 mg/dL — ABNORMAL HIGH (ref 70–99)

## 2022-05-13 MED ORDER — INSULIN DETEMIR 100 UNIT/ML ~~LOC~~ SOLN
55.0000 [IU] | Freq: Every day | SUBCUTANEOUS | Status: DC
  Administered 2022-05-13: 55 [IU] via SUBCUTANEOUS
  Filled 2022-05-13 (×2): qty 0.55

## 2022-05-13 NOTE — Progress Notes (Signed)
PROGRESS NOTE   Subjective/Complaints:  No acute complaints.  No events overnight.  Blood glucose remains elevated 200s. Patient requesting toi nap with CPAP.   ROS- denies CP, SOB, N/V/D Objective:   No results found. No results for input(s): "WBC", "HGB", "HCT", "PLT" in the last 72 hours.  No results for input(s): "NA", "K", "CL", "CO2", "GLUCOSE", "BUN", "CREATININE", "CALCIUM" in the last 72 hours.   Intake/Output Summary (Last 24 hours) at 05/13/2022 1409 Last data filed at 05/13/2022 1407 Gross per 24 hour  Intake 697 ml  Output 1100 ml  Net -403 ml         Physical Exam: Vital Signs Blood pressure 129/65, pulse (!) 55, temperature 97.8 F (36.6 C), resp. rate 18, weight 110.2 kg, SpO2 100 %.   General: No acute distress Mood and affect are appropriate Heart: Regular rate and rhythm no rubs murmurs or extra sounds Lungs: Clear to auscultation, breathing unlabored, no rales or wheezes. + CPAP Abdomen: Positive bowel sounds, soft nontender to palpation, nondistended Extremities: No clubbing, cyanosis, or edema Skin: No evidence of breakdown, no evidence of rash Neurologic: Cranial nerves II through XII intact, motor strength is 5/5 in right and 3- left deltoid, bicep, tricep, grip, 5/5 right and 4-/5 left hip flexor, knee extensors, ankle dorsiflexor and plantar flexor Sensory exam normal sensation to light touch and proprioception in bilateral upper and lower extremities Cerebellar exam deferred due to weakness  2/24-25: Antigravity and against resistance all 4 extremities   Musculoskeletal: Full range of motion in all 4 extremities. No joint swelling   Assessment/Plan: 1. Functional deficits which require 3+ hours per day of interdisciplinary therapy in a comprehensive inpatient rehab setting. Physiatrist is providing close team supervision and 24 hour management of active medical problems listed  below. Physiatrist and rehab team continue to assess barriers to discharge/monitor patient progress toward functional and medical goals  Care Tool:  Bathing    Body parts bathed by patient: Left arm, Chest, Abdomen, Front perineal area, Right upper leg, Left upper leg, Face   Body parts bathed by helper: Right arm, Buttocks, Left lower leg, Right lower leg     Bathing assist Assist Level: Moderate Assistance - Patient 50 - 74%     Upper Body Dressing/Undressing Upper body dressing   What is the patient wearing?: Pull over shirt    Upper body assist Assist Level: Moderate Assistance - Patient 50 - 74%    Lower Body Dressing/Undressing Lower body dressing      What is the patient wearing?: Underwear/pull up, Pants     Lower body assist Assist for lower body dressing: Maximal Assistance - Patient 25 - 49%     Toileting Toileting    Toileting assist Assist for toileting: Moderate Assistance - Patient 50 - 74%     Transfers Chair/bed transfer  Transfers assist     Chair/bed transfer assist level: Moderate Assistance - Patient 50 - 74%     Locomotion Ambulation   Ambulation assist      Assist level: Minimal Assistance - Patient > 75% Assistive device: Walker-rolling Max distance: 200 ft   Walk 10 feet activity   Assist  Assist level: Moderate Assistance - Patient - 50 - 74% Assistive device: No Device   Walk 50 feet activity   Assist    Assist level: Minimal Assistance - Patient > 75% Assistive device: Walker-rolling    Walk 150 feet activity   Assist    Assist level: Minimal Assistance - Patient > 75% Assistive device: Walker-rolling    Walk 10 feet on uneven surface  activity   Assist Walk 10 feet on uneven surfaces activity did not occur: Safety/medical concerns         Wheelchair     Assist Is the patient using a wheelchair?: Yes Type of Wheelchair: Manual    Wheelchair assist level: Maximal Assistance - Patient  25 - 49% Max wheelchair distance: 60 ft    Wheelchair 50 feet with 2 turns activity    Assist        Assist Level: Maximal Assistance - Patient 25 - 49%   Wheelchair 150 feet activity     Assist      Assist Level: Total Assistance - Patient < 25%   Blood pressure 129/65, pulse (!) 55, temperature 97.8 F (36.6 C), resp. rate 18, weight 110.2 kg, SpO2 100 %.  Medical Problem List and Plan: 1. 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             -patient may  shower             -ELOS/Goals: 10-12d Supervision  2.  Antithrombotics: -DVT/anticoagulation:  Pharmaceutical: Heparin- switch to lovenox , no renal disease              -antiplatelet therapy: Aspirin 81 mg daily resumed on 05/07/2022 3. Pain Management: Topamax 50 mg daily for headache 4. Mood/Behavior/Sleep: Provide emotional support             -antipsychotic agents: N/A 5. Neuropsych/cognition: This patient is capable of making decisions on his own behalf. 6. Skin/Wound Care: Routine skin checks 7. Fluids/Electrolytes/Nutrition: Routine in and outs with follow-up chemistries hypoK+ on HCTZ start daily KCL, recheck on 2/23 8.  Hypothyroidism.  Synthroid 9.  Diabetes mellitus.  Hemoglobin A1c 8.3.  Currently on SSI.  Patient was on Levemir 39 units twice daily prior to admission as well as Farxiga 10 mg daily and Trulicity 3 mg every Thursday.  Resume as needed\ CBG (last 3)  Recent Labs    05/12/22 2106 05/13/22 0544 05/13/22 1200  GLUCAP 287* 195* 261*   Restart levimir, 15U qhs 2/20, increase to 25U on 2/21, increase to 35 U on 2/22, increased to 45 units on 2/24; 55 U 2/25, which patient endorses is his home dose   10.  Hypertension.  Coreg 12.5 mg twice daily, HCTZ 25 mg daily.  Monitor with increased mobility Vitals:   05/12/22 1936 05/13/22 0437  BP: 125/62 129/65  Pulse: 70 (!) 55  Resp: 17 18  Temp: 98.1 F (36.7 C) 97.8  F (36.6 C)  SpO2: 96% 100%  Some lability will monitor prior to dosage change   - normotensive, monitor  11.  Hyperlipidemia.  Crestor 12.  Urinary incontinence  will try to wean off condom cath     LOS: 6 days A FACE TO FACE EVALUATION WAS PERFORMED  Joseph Ortega 05/13/2022, 2:09 PM

## 2022-05-14 LAB — GLUCOSE, CAPILLARY
Glucose-Capillary: 215 mg/dL — ABNORMAL HIGH (ref 70–99)
Glucose-Capillary: 184 mg/dL — ABNORMAL HIGH (ref 70–99)
Glucose-Capillary: 245 mg/dL — ABNORMAL HIGH (ref 70–99)
Glucose-Capillary: 277 mg/dL — ABNORMAL HIGH (ref 70–99)

## 2022-05-14 LAB — BASIC METABOLIC PANEL
Anion gap: 8 (ref 5–15)
BUN: 19 mg/dL (ref 8–23)
CO2: 28 mmol/L (ref 22–32)
Calcium: 9 mg/dL (ref 8.9–10.3)
Chloride: 101 mmol/L (ref 98–111)
Creatinine, Ser: 0.95 mg/dL (ref 0.61–1.24)
GFR, Estimated: >60 mL/min (ref >60)
Glucose, Bld: 188 mg/dL — ABNORMAL HIGH (ref 70–99)
Potassium: 3.1 mmol/L — ABNORMAL LOW (ref 3.5–5.1)
Sodium: 137 mmol/L (ref 135–145)

## 2022-05-14 MED ORDER — POTASSIUM CHLORIDE CRYS ER 10 MEQ PO TBCR
10.0000 meq | EXTENDED_RELEASE_TABLET | Freq: Three times a day (TID) | ORAL | Status: DC
  Administered 2022-05-14 – 2022-05-21 (×21): 10 meq via ORAL
  Filled 2022-05-14 (×21): qty 1

## 2022-05-14 MED ORDER — INSULIN DETEMIR 100 UNIT/ML ~~LOC~~ SOLN
60.0000 [IU] | Freq: Every day | SUBCUTANEOUS | Status: DC
  Administered 2022-05-14 – 2022-05-15 (×2): 60 [IU] via SUBCUTANEOUS
  Filled 2022-05-14 (×3): qty 0.6

## 2022-05-14 NOTE — Progress Notes (Signed)
Speech Language Pathology Daily Session Note  Patient Details  Name: Joseph Ortega MRN: [id number] Date of Birth: 1955-11-21  Today's Date: 05/14/2022 SLP Individual Time: 1445-1530 SLP Individual Time Calculation (min): 45 min  Short Term Goals: Week 1: SLP Short Term Goal 1 (Week 1): Patient will recall and use memory compensations with min A verbal cues for effectiveness SLP Short Term Goal 2 (Week 1): Patient will complete complex problem solving with iADL tasks including medication and/or money management with min A verbal cues SLP Short Term Goal 3 (Week 1): Patient will complete scheduling and/or organization tasks with min A verbal cues  Skilled Therapeutic Interventions: Skilled ST treatment focused on cognitive goals. Ortega was greeted upright in wheelchair and in good spirits. Pt reported after discussing medication list during a previous ST session, pt has been able to be more aware of the medications he is receiving, as well as what they're for, and how many times a day to expect them. Pt reported this information has been especially helpful when it came to understanding medications related to bowels. Pt independently listed 3 medications by name and their function.   SLP facilitated a medication management task where pt identified BID pillbox errors with sup A verbal cues to achieve 100% accuracy.   SLP then facilitated "solving daily math problems" via the ALFA with 2/8 accurate at independent level, progressing to 6/8 with mod A verbal cues, and 8/8 with max A multimodal cues. Pt reported increased difficulty with numbers. SLP facilitated use of compensations such as writing problems down, and using calculator with sup A for recall of problem, processing, and sequencing.   Patient was left in wheelchair with alarm activated and immediate needs within reach at end of session. Continue per current plan of care.      Pain  None/denied  Therapy/Group: Individual Therapy  Joseph Ortega  Joseph Ortega 05/14/2022, 3:00 PM

## 2022-05-14 NOTE — Progress Notes (Signed)
Occupational Therapy Session Note  Patient Details  Name: Joseph Ortega MRN: [id number] Date of Birth: 10-Apr-1955  Today's Date: 05/14/2022 OT Individual Time: [id number] OT Individual Time Calculation (min): 45 min    Short Term Goals: Week 1:  OT Short Term Goal 1 (Week 1): Complete UB self care with S using hemi techniques OT Short Term Goal 2 (Week 1): Complete LB self care using hemi techniques with CGA OT Short Term Goal 3 (Week 1): Amb for toilet transfer with RW with CGA OT Short Term Goal 4 (Week 1): Pt will stand for oral care or kitchen sink task for up to 5 min with CGA  Skilled Therapeutic Interventions/Progress Updates:  Pt received resting in bed for skilled OT session with focus on BADL retraining. Pt agreeable to interventions, demonstrating overall pleasant mood. Pt reported 0/10 pain, stating "I've got no issues." OT offering intermediate rest breaks and positioning suggestions throughout session to address pain/fatigue and maximize participation/safety in session.   Pt performs supine>sit EOB with Min A + HOB elevated, cuing for hip movement provided. Sitting EOB, pt doff/dons clean shirt with supervision, demonstrating strong carry-over of hemi-dressing techniques. Pt requires Min A for threading on LLE into shorts with education provided on rolling material prior to attempting threading. Pt brings leg into figure-4 to assist/complete threading of shorts and doffing/donning of socks. Pt stands with Min A + RW + bed elevated to don shorts over bottom/hips with CGA.   Pt then ambulates towards sink, standing to perform oral care with CGA + RW. Pt performing multiple STS transfers with Min A + RW, ambulating room-level distance into hallway/back to room with overall Min A + RW.   Pt using LUE as gross support throughout session without cuing.   Pt remained sitting in [facility] with all immediate needs met at end of session. Pt continues to be appropriate for skilled OT intervention  to promote further functional independence.   Therapy Documentation Precautions:  Precautions Precautions: Fall Precaution Comments: L sided weakness; BP <150 Restrictions Weight Bearing Restrictions: No Other Position/Activity Restrictions: Needs Loop Monitor cardiac device wiht him in therapy   Therapy/Group: Individual Therapy  Maudie Mercury, OTR/L, MSOT  05/14/2022, 5:58 AM

## 2022-05-14 NOTE — Progress Notes (Signed)
PROGRESS NOTE   Subjective/Complaints:  No issues overnite , no pains , discussed K+   ROS- denies CP, SOB, N/V/D Objective:   No results found. No results for input(s): "WBC", "HGB", "HCT", "PLT" in the last 72 hours.  Recent Labs    05/14/22 0714  NA 137  K 3.1*  CL 101  CO2 28  GLUCOSE 188*  BUN 19  CREATININE 0.95  CALCIUM 9.0     Intake/Output Summary (Last 24 hours) at 05/14/2022 0956 Last data filed at 05/14/2022 0737 Gross per 24 hour  Intake 714 ml  Output 1175 ml  Net -461 ml         Physical Exam: Vital Signs Blood pressure 137/61, pulse 62, temperature (!) 97.5 F (36.4 C), temperature source Oral, resp. rate 16, weight 110.2 kg, SpO2 100 %.   General: No acute distress Mood and affect are appropriate Heart: Regular rate and rhythm no rubs murmurs or extra sounds Lungs: Clear to auscultation, breathing unlabored, no rales or wheezes. + CPAP Abdomen: Positive bowel sounds, soft nontender to palpation, nondistended Extremities: No clubbing, cyanosis, or edema Skin: No evidence of breakdown, no evidence of rash Neurologic: Cranial nerves II through XII intact, motor strength is 5/5 in right and 3- left deltoid, bicep, tricep, grip, 5/5 right and 4-/5 left hip flexor, knee extensors, ankle dorsiflexor and plantar flexor Sensory exam normal sensation to light touch and proprioception in bilateral upper and lower extremities Cerebellar exam deferred due to weakness on left     Musculoskeletal: Full range of motion in all 4 extremities. No joint swelling   Assessment/Plan: 1. Functional deficits which require 3+ hours per day of interdisciplinary therapy in a comprehensive inpatient rehab setting. Physiatrist is providing close team supervision and 24 hour management of active medical problems listed below. Physiatrist and rehab team continue to assess barriers to discharge/monitor patient  progress toward functional and medical goals  Care Tool:  Bathing    Body parts bathed by patient: Left arm, Chest, Abdomen, Front perineal area, Right upper leg, Left upper leg, Face   Body parts bathed by helper: Right arm, Buttocks, Left lower leg, Right lower leg     Bathing assist Assist Level: Moderate Assistance - Patient 50 - 74%     Upper Body Dressing/Undressing Upper body dressing   What is the patient wearing?: Pull over shirt    Upper body assist Assist Level: Moderate Assistance - Patient 50 - 74%    Lower Body Dressing/Undressing Lower body dressing      What is the patient wearing?: Underwear/pull up, Pants     Lower body assist Assist for lower body dressing: Maximal Assistance - Patient 25 - 49%     Toileting Toileting    Toileting assist Assist for toileting: Moderate Assistance - Patient 50 - 74%     Transfers Chair/bed transfer  Transfers assist     Chair/bed transfer assist level: Moderate Assistance - Patient 50 - 74%     Locomotion Ambulation   Ambulation assist      Assist level: Minimal Assistance - Patient > 75% Assistive device: Walker-rolling Max distance: 200 ft   Walk 10 feet activity  Assist     Assist level: Moderate Assistance - Patient - 50 - 74% Assistive device: No Device   Walk 50 feet activity   Assist    Assist level: Minimal Assistance - Patient > 75% Assistive device: Walker-rolling    Walk 150 feet activity   Assist    Assist level: Minimal Assistance - Patient > 75% Assistive device: Walker-rolling    Walk 10 feet on uneven surface  activity   Assist Walk 10 feet on uneven surfaces activity did not occur: Safety/medical concerns         Wheelchair     Assist Is the patient using a wheelchair?: Yes Type of Wheelchair: Manual    Wheelchair assist level: Maximal Assistance - Patient 25 - 49% Max wheelchair distance: 60 ft    Wheelchair 50 feet with 2 turns  activity    Assist        Assist Level: Maximal Assistance - Patient 25 - 49%   Wheelchair 150 feet activity     Assist      Assist Level: Total Assistance - Patient < 25%   Blood pressure 137/61, pulse 62, temperature (!) 97.5 F (36.4 C), temperature source Oral, resp. rate 16, weight 110.2 kg, SpO2 100 %.  Medical Problem List and Plan: 1. 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 Had a prior Left hemiparesis but has more weakness since most recent CVA              -patient may  shower             -ELOS/Goals: 10-12d Supervision  2.  Antithrombotics: -DVT/anticoagulation:  Pharmaceutical: Heparin- switch to lovenox , no renal disease              -antiplatelet therapy: Aspirin 81 mg daily resumed on 05/07/2022 3. Pain Management: Topamax 50 mg daily for headache 4. Mood/Behavior/Sleep: Provide emotional support             -antipsychotic agents: N/A 5. Neuropsych/cognition: This patient is capable of making decisions on his own behalf. 6. Skin/Wound Care: Routine skin checks 7. Fluids/Electrolytes/Nutrition: Routine in and outs with follow-up chemistries hypoK+ on HCTZ start daily KCL,     Latest Ref Rng & Units 05/14/2022    7:14 AM 05/08/2022    5:36 AM 05/07/2022   10:23 PM  BMP  Glucose 70 - 99 mg/dL 188  244    BUN 8 - 23 mg/dL 19  17    Creatinine 0.61 - 1.24 mg/dL 0.95  1.14  1.36   Sodium 135 - 145 mmol/L 137  136    Potassium 3.5 - 5.1 mmol/L 3.1  3.3    Chloride 98 - 111 mmol/L 101  98    CO2 22 - 32 mmol/L 28  30    Calcium 8.9 - 10.3 mg/dL 9.0  9.0     Increase KCL to 30mq TID  8.  Hypothyroidism.  Synthroid 9.  Diabetes mellitus.  Hemoglobin A1c 8.3.  Currently on SSI.  Patient was on Levemir 39 units twice daily prior to admission as well as Farxiga 10 mg daily and Trulicity 3 mg every Thursday.  Resume as needed\ CBG (last 3)  Recent Labs    05/13/22 1633 05/13/22 2110  05/14/22 0555  GLUCAP 220* 273* 215*   Restart levimir, 15U qhs 2/20, increase to 25U on 2/21, increase to 35 U on 2/22, increased to 45 units on 2/24; 55 U 2/25,  Increase to 60U on 2/26 once am CBG <180 should be able to restart Farxiga  10.  Hypertension.  Coreg 12.5 mg twice daily, HCTZ 25 mg daily.  Monitor with increased mobility Vitals:   05/14/22 0758 05/14/22 0758  BP: 137/61 137/61  Pulse: 62 62  Resp:    Temp:    SpO2:    Some lability will monitor prior to dosage change   - normotensive, monitor 2/26  11.  Hyperlipidemia.  Crestor 12.  Urinary incontinence  will try to wean off condom cath     LOS: 7 days A FACE TO FACE EVALUATION WAS PERFORMED  Charlett Blake 05/14/2022, 9:56 AM

## 2022-05-14 NOTE — Plan of Care (Signed)
  Problem: Consults Goal: RH STROKE PATIENT EDUCATION Description: See Patient Education module for education specifics  Outcome: Progressing   Problem: RH SAFETY Goal: RH STG ADHERE TO SAFETY PRECAUTIONS W/ASSISTANCE/DEVICE Description: STG Adhere to Safety Precautions With cues Assistance/Device. Outcome: Progressing   Problem: RH PAIN MANAGEMENT Goal: RH STG PAIN MANAGED AT OR BELOW PT'S PAIN GOAL Description: < 4 with prns Outcome: Progressing   Problem: RH KNOWLEDGE DEFICIT Goal: RH STG INCREASE KNOWLEDGE OF DIABETES Description: Patient and Spouse will be able to manage DM with medications and dietary modifications using educational resources independently Outcome: Progressing Goal: RH STG INCREASE KNOWLEDGE OF HYPERTENSION Description: Patient and S.O. will be able to manage HTN with medications and dietary modifications using educational resources independently Outcome: Progressing Goal: RH STG INCREASE KNOWLEGDE OF HYPERLIPIDEMIA Description: Patient and S.O. 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 will be able to manage secondary risks with medications and dietary modifications using educational resources independently Outcome: Progressing   Problem: Education: Goal: Knowledge of disease or condition will improve Outcome: Progressing Goal: Knowledge of secondary prevention will improve (MUST DOCUMENT ALL) Outcome: Progressing Goal: Knowledge of patient specific risk factors will improve Elta [place] N/A or DELETE if not current risk factor) Outcome: Progressing   Problem: Intracerebral Hemorrhage Tissue Perfusion: Goal: Complications of Intracerebral Hemorrhage will be minimized Outcome: Progressing   Problem: Coping: Goal: Will verbalize positive feelings about self Outcome: Progressing Goal:  Will identify appropriate support needs Outcome: Progressing   Problem: Health Behavior/Discharge Planning: Goal: Ability to manage health-related needs will improve Outcome: Progressing Goal: Goals will be collaboratively established with patient/family Outcome: Progressing   Problem: Self-Care: Goal: Ability to participate in self-care as condition permits will improve Outcome: Progressing Goal: Verbalization of feelings and concerns over difficulty with self-care will improve Outcome: Progressing Goal: Ability to communicate needs accurately will improve Outcome: Progressing   Problem: Nutrition: Goal: Risk of aspiration will decrease Outcome: Progressing Goal: Dietary intake will improve Outcome: Progressing

## 2022-05-14 NOTE — Progress Notes (Signed)
Occupational Therapy Session Note  Patient Details  Name: Joseph Ortega MRN: [id number] Date of Birth: 1956/01/14  Today's Date: 05/14/2022 OT Individual Time: [id number] OT Individual Time Calculation (min): 45 min  OT Individual Time: 1405-1430 OT Individual Time Calculation (min): 25 min  Short Term Goals: Week 1:  OT Short Term Goal 1 (Week 1): Complete UB self care with S using hemi techniques OT Short Term Goal 2 (Week 1): Complete LB self care using hemi techniques with CGA OT Short Term Goal 3 (Week 1): Amb for toilet transfer with RW with CGA OT Short Term Goal 4 (Week 1): Pt will stand for oral care or kitchen sink task for up to 5 min with CGA  Skilled Therapeutic Interventions/Progress Updates:     AM Session: Pt received sitting up in wc upon OT arrival reporting 0/10 pain presenting to be in good spirits. Pt agreeable to skilled OT session with a focus on LUE NMR-education and functional mobility/transfer training. Pt wearing LLE AFO upon OT arrival. Pt presenting to be dressed and ready for the day, politely refusing need for BADLs this session.  Pt reporting his legs were "numb" upon OT arrival d/t  sitting for prolonged period of time. OT guided Pt through seated LB exercises to increase blood flow to BLEs prior to standing with Pt reporting improvement following. Pt completed 1x10 reps of single leg flexion/extension R/L, marches, and heel raises. Sit>stand with min cueing for body mechanics/technique min A using RW.   Pt ambulated to therapy gym using RW with LUE hand splint with CGA. Mod VB and tactile cueing required for step length, weight shifting, and foot height. Pt provided seated rest break EOB upon arriving to gym for energy conservation.   Pt completed seated LUE NMR-education activities to increase functional use of LUE for BADL and IADL tasks. OT utilized arm ranger on L side to provide AAROM with increased muscle activation and control facilitated. Pt completed  the following movements with arm ranger support: -1x10 reps of shoulder protraction/retraction with elbow flexion/extension -1x10 reps shoulder circumduction with elbow flexion/extension -1x10 reps of shoulder protraction/retraction with elbow flexion/extension with supination facilitation for L forearm  Pt presenting with increased muscle control and activation this session with mod cueing required to prevent compensatory movements.   Task graded up to incorporate  targeted functional reaching with supination facilitation. Pt instructed to reach for cone presented anterior to Pt trunk with Pt initially utilizing compensatory movement of shoulder elevation and forearm pronation. OT provided VB cueing to imaging reaching with palm towards the sky with noted improvement of supination and shoulder external rotation. Facilitated increased shoulder external rotation with Pt stacking cone to his L with elbow tucked into his side.   Pt transported back to room total A in wc for energy conservation. Pt was left resting in wc with call bell in reach, seat belt alarm on, and all needs met.   PM Session: Pt received sitting up in wc upon OT arrival reporting 0/10 pain presenting to be in good spirits. Pt agreeable to skilled OT session with a focus on LUE NMR-education. Pt transported total A to therapy gym in wc for time management and energy conservation. Pt sit>stand using RW CGA with min cueing for body mechanics. Pt completed standing clothes pin tasks using LUE at elevated table with single arm support of RUE to work on functional reaching and dynamic standing balance. Pt able to don clothes pins (yellow- light resistance) onto vertical pole using LUE x5  trials. OT facilitating external rotation at shoulder to support improved body mechanics and functional reach pattern. Rest break provided following. Pt then removed clothes pins in standing using LUE with min hand under hand provided to support functional  grasp/reach and external rotation at shoulder with rest break following. Pt transported back to room at end of session total A in wc. Pt left resting in wc with call bell in reach, seat belt alarm on, and all needs met.   Therapy Documentation Precautions:  Precautions Precautions: Fall Precaution Comments: L sided weakness; BP <150 Restrictions Weight Bearing Restrictions: No Other Position/Activity Restrictions: Needs Loop Monitor cardiac device wiht him in therapy General:   Vital Signs: Therapy Vitals Temp: (Abnormal) 97.5 F (36.4 C) Temp Source: Oral Pulse Rate: 62 Resp: 16 BP: 137/61 Patient Position (if appropriate): Lying Oxygen Therapy SpO2: 100 % O2 Device: CPAP O2 Flow Rate (L/min): 2 L/min Pain:   ADL: ADL Equipment Provided: Long-handled sponge Eating: Set up Where Assessed-Eating: Bed level Grooming: Moderate assistance Where Assessed-Grooming: Sitting at sink Upper Body Bathing: Moderate assistance Where Assessed-Upper Body Bathing: Sitting at sink Lower Body Bathing: Maximal assistance Where Assessed-Lower Body Bathing: Sitting at sink, Standing at sink Upper Body Dressing: Moderate assistance Where Assessed-Upper Body Dressing: Sitting at sink Lower Body Dressing: Maximal assistance Where Assessed-Lower Body Dressing: Sitting at sink, Standing at sink Toileting: Minimal assistance Where Assessed-Toileting: Glass blower/designer: Moderate assistance Toilet Transfer Method: Counselling psychologist: Bedside commode, Energy manager: Maximal Firefighter Method: Radiographer, therapeutic: Radio broadcast assistant ADL Comments: min A amb with RW with limited L grasp- now with L walker splint, min A UB self care, mod a LB self care pull on clothing   Therapy/Group: Individual Therapy  Janey Genta 05/14/2022, 8:01 AM

## 2022-05-15 ENCOUNTER — Ambulatory Visit: Payer: BC Managed Care – PPO | Admitting: Physical Therapy

## 2022-05-15 ENCOUNTER — Encounter: Payer: BC Managed Care – PPO | Admitting: Occupational Therapy

## 2022-05-15 LAB — GLUCOSE, CAPILLARY
Glucose-Capillary: 156 mg/dL — ABNORMAL HIGH (ref 70–99)
Glucose-Capillary: 224 mg/dL — ABNORMAL HIGH (ref 70–99)
Glucose-Capillary: 246 mg/dL — ABNORMAL HIGH (ref 70–99)
Glucose-Capillary: 224 mg/dL — ABNORMAL HIGH (ref 70–99)

## 2022-05-15 MED ORDER — METHOCARBAMOL 500 MG PO TABS
500.0000 mg | ORAL_TABLET | Freq: Four times a day (QID) | ORAL | Status: DC | PRN
  Administered 2022-05-15 – 2022-05-23 (×11): 500 mg via ORAL
  Filled 2022-05-15 (×12): qty 1

## 2022-05-15 MED ORDER — DAPAGLIFLOZIN PROPANEDIOL 5 MG PO TABS
5.0000 mg | ORAL_TABLET | Freq: Every day | ORAL | Status: DC
  Administered 2022-05-16 – 2022-05-18 (×3): 5 mg via ORAL
  Filled 2022-05-15 (×3): qty 1

## 2022-05-15 NOTE — Progress Notes (Addendum)
Speech Language Pathology Weekly Progress and Session Note  Patient Details  Name: Joseph Ortega MRN: [id number] Date of Birth: 11/03/1955  Beginning of progress report period: May 08, 2022 End of progress report period: May 15, 2022  Today's Date: 05/15/2022 SLP Individual Time: 1120-1210 SLP Individual Time Calculation (min): 50 min  Short Term Goals: Week 1: SLP Short Term Goal 1 (Week 1): Patient will recall and use memory compensations with min A verbal cues for effectiveness SLP Short Term Goal 1 - Progress (Week 1): Met SLP Short Term Goal 2 (Week 1): Patient will complete complex problem solving with iADL tasks including medication and/or money management with min A verbal cues SLP Short Term Goal 2 - Progress (Week 1): Partly met (medication management sup A level; money management mod A) SLP Short Term Goal 3 (Week 1): Patient will complete scheduling and/or organization tasks with min A verbal cues  New Short Term Goals: Week 2: SLP Short Term Goal 1 (Week 2): Patient will recall and use memory compensations with sup A verbal cues for effectiveness SLP Short Term Goal 2 (Week 2): Patient will complete complex problem solving with iADL tasks including medication and/or money management with sup A verbal cues SLP Short Term Goal 3 (Week 2): Patient will complete scheduling and/or organization tasks with sup A verbal cues  Weekly Progress Updates: Patient has made functional gains and has met 3 of 3 STGs this reporting period. Patient is currently completing cognitive tasks ranging from sup-to-mod A verbal/visual cues to complete functional and complex tasks accurately and safely. SLP continues to address functional problem solving, memory, and executive functioning. Patient and family education is ongoing. Patient would benefit from continued skilled SLP intervention to maximize cognitive functioning and overall functional independence prior to discharge.   Intensity:  Minumum of 1-2 x/day, 30 to 90 minutes Frequency: 3 to 5 out of 7 days Duration/Length of Stay: 12-14 days (TBD at conference on 2/28) Treatment/Interventions: Cognitive remediation/compensation;Functional tasks;Patient/family education;Therapeutic Activities  Daily Session Skilled Therapeutic Interventions: Skilled ST treatment focused on cognitive goals. Pt was greeted upright in wheelchair and reported feeling grateful for the progress he has made thus far, and optimism he has gained regarding his recovery. SLP facilitated identification of BID pillbox errors as continuation yesterday's session. Pt identified errors with sup A verbal cues to achieve 90% accuracy. Errors appeared primarily attributed to visuospatial deficits. Pt utilized external memory aid with sup A verbal cues fading to mod I for effectiveness. Pt engaged in a verbal reasoning task outlining medication schedule and considerations with diabetes medications taken at [facility] with excellent attention to detail, safety, and understanding of medication purpose. Pt continues to demonstrate great carry over between sessions and improving recall. Patient was left in wheelchair with alarm activated and immediate needs within reach at end of session. Continue per current plan of care.       General    Pain Pain Assessment Pain Scale: 0-10 Pain Score: 4  Pain Type: Acute pain Pain Location: Back Pain Orientation: Posterior Pain Frequency: Occasional Pain Onset: On-going Pain Intervention(s): Medication (See eMAR)  Therapy/Group: Individual Therapy  Patty Sermons 05/15/2022, 8:19 PM

## 2022-05-15 NOTE — Progress Notes (Signed)
Occupational Therapy Session Note  Patient Details  Name: Joseph Ortega MRN: [id number] Date of Birth: [birth year]  Today's Date: 05/15/2022 OT Individual Time: [id number] OT Individual Time Calculation (min): 54 min    Short Term Goals: Week 1:  OT Short Term Goal 1 (Week 1): Complete UB self care with S using hemi techniques OT Short Term Goal 2 (Week 1): Complete LB self care using hemi techniques with CGA OT Short Term Goal 3 (Week 1): Amb for toilet transfer with RW with CGA OT Short Term Goal 4 (Week 1): Pt will stand for oral care or kitchen sink task for up to 5 min with CGA  Skilled Therapeutic Interventions/Progress Updates:  Pt greeted seated EOB agreeable to OT intervention. ADL needs met. Pt completed stand pivot to w/c to L side with CGA with no AD, total A transport to gym. Session focused on various LUE NMR tasks to faciliated improved LUE coordination: - 3x10 scapular protraction/retraction with pt rollling 3.3 lb  weighted ball forward/backwards on table  -3x10 wrist supination/pronation with pt rolling ball R<>L  - worked on targeted reach with pt reaching towards cones and matching to colored dots on table, pt with most difficulty with shoulder flexion and motor planning. Education provided on breaking down each movement into small steps. Pt completed task with overall MINA at elbow to facilitate functional reach - pt completed seated rainbows with 3.3 lb weighted ball, 3x10 reps with an emphasis on wrist supination/pronation - seated chest presses with 3.3 lb weighted ball,  3x10 reps with an emphasis on elbow flexion/extension - bicep curls with 3.3 lb weighted ball with an emphasis on elbow flexion/extension - seated shoulder circumduction with un weighted dowel rod 3x10 reps   Pt transported back to room with total A, ended session with pt seated in w/c with alarm belt activated and all needs within reach.   Therapy Documentation Precautions:   Precautions Precautions: Fall Precaution Comments: L sided weakness; BP <150 Restrictions Weight Bearing Restrictions: No Other Position/Activity Restrictions: Needs Loop Monitor cardiac device wiht him in therapy    Pain: unrated back pain, rest breaks provided as needed.     Therapy/Group: Individual Therapy  Corinne Ports [facility] 05/15/2022, 12:10 PM

## 2022-05-15 NOTE — Progress Notes (Signed)
PROGRESS NOTE   Subjective/Complaints:  Discussed K+ level with pt , also PT in room , discussed loop recorder function as well as orthotic consult   ROS- denies CP, SOB, N/V/D Objective:   No results found. No results for input(s): "WBC", "HGB", "HCT", "PLT" in the last 72 hours.  Recent Labs    05/14/22 0714  NA 137  K 3.1*  CL 101  CO2 28  GLUCOSE 188*  BUN 19  CREATININE 0.95  CALCIUM 9.0     Intake/Output Summary (Last 24 hours) at 05/15/2022 0844 Last data filed at 05/15/2022 0816 Gross per 24 hour  Intake 945 ml  Output 475 ml  Net 470 ml         Physical Exam: Vital Signs Blood pressure 135/67, pulse 60, temperature 98 F (36.7 C), temperature source Oral, resp. rate 16, weight 81.9 kg, SpO2 99 %.   General: No acute distress Mood and affect are appropriate Heart: Regular rate and rhythm no rubs murmurs or extra sounds Lungs: Clear to auscultation, breathing unlabored, no rales or wheezes.  Abdomen: Positive bowel sounds, soft nontender to palpation, nondistended Extremities: No clubbing, cyanosis, or edema Skin: No evidence of breakdown, no evidence of rash Neurologic: Cranial nerves II through XII intact, motor strength is 5/5 in right and 3- left deltoid, bicep, tricep, grip, 5/5 right and 4-/5 left hip flexor, knee extensors, did not test ankle dorsiflexor and plantar flexor due to AFO and PT in progress  Cerebellar exam deferred due to weakness on left  Musculoskeletal: Full range of motion in all 4 extremities. No joint swelling   Assessment/Plan: 1. Functional deficits which require 3+ hours per day of interdisciplinary therapy in a comprehensive inpatient rehab setting. Physiatrist is providing close team supervision and 24 hour management of active medical problems listed below. Physiatrist and rehab team continue to assess barriers to discharge/monitor patient progress toward  functional and medical goals  Care Tool:  Bathing    Body parts bathed by patient: Left arm, Chest, Abdomen, Front perineal area, Right upper leg, Left upper leg, Face   Body parts bathed by helper: Right arm, Buttocks, Left lower leg, Right lower leg     Bathing assist Assist Level: Moderate Assistance - Patient 50 - 74%     Upper Body Dressing/Undressing Upper body dressing   What is the patient wearing?: Pull over shirt    Upper body assist Assist Level: Moderate Assistance - Patient 50 - 74%    Lower Body Dressing/Undressing Lower body dressing      What is the patient wearing?: Underwear/pull up, Pants     Lower body assist Assist for lower body dressing: Maximal Assistance - Patient 25 - 49%     Toileting Toileting    Toileting assist Assist for toileting: Moderate Assistance - Patient 50 - 74%     Transfers Chair/bed transfer  Transfers assist     Chair/bed transfer assist level: Moderate Assistance - Patient 50 - 74%     Locomotion Ambulation   Ambulation assist      Assist level: Minimal Assistance - Patient > 75% Assistive device: Walker-rolling Max distance: 200 ft   Walk 10 feet  activity   Assist     Assist level: Moderate Assistance - Patient - 50 - 74% Assistive device: No Device   Walk 50 feet activity   Assist    Assist level: Minimal Assistance - Patient > 75% Assistive device: Walker-rolling    Walk 150 feet activity   Assist    Assist level: Minimal Assistance - Patient > 75% Assistive device: Walker-rolling    Walk 10 feet on uneven surface  activity   Assist Walk 10 feet on uneven surfaces activity did not occur: Safety/medical concerns         Wheelchair     Assist Is the patient using a wheelchair?: Yes Type of Wheelchair: Manual    Wheelchair assist level: Maximal Assistance - Patient 25 - 49% Max wheelchair distance: 60 ft    Wheelchair 50 feet with 2 turns activity    Assist         Assist Level: Maximal Assistance - Patient 25 - 49%   Wheelchair 150 feet activity     Assist      Assist Level: Total Assistance - Patient < 25%   Blood pressure 135/67, pulse 60, temperature 98 F (36.7 C), temperature source Oral, resp. rate 16, weight 81.9 kg, SpO2 99 %.  Medical Problem List and Plan: 1. 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 Had a prior Left hemiparesis but has more weakness since most recent CVA              -patient may  shower             -ELOS/Goals: 10-12d Supervision, cont PT, OT, Team conf in am   2.  Antithrombotics: -DVT/anticoagulation:  Pharmaceutical: Heparin- switch to lovenox , no renal disease              -antiplatelet therapy: Aspirin 81 mg daily resumed on 05/07/2022 3. Pain Management: Topamax 50 mg daily for headache 4. Mood/Behavior/Sleep: Provide emotional support             -antipsychotic agents: N/A 5. Neuropsych/cognition: This patient is capable of making decisions on his own behalf. 6. Skin/Wound Care: Routine skin checks 7. Fluids/Electrolytes/Nutrition: Routine in and outs with follow-up chemistries hypoK+ on HCTZ start daily KCL,     Latest Ref Rng & Units 05/14/2022    7:14 AM 05/08/2022    5:36 AM 05/07/2022   10:23 PM  BMP  Glucose 70 - 99 mg/dL 188  244    BUN 8 - 23 mg/dL 19  17    Creatinine 0.61 - 1.24 mg/dL 0.95  1.14  1.36   Sodium 135 - 145 mmol/L 137  136    Potassium 3.5 - 5.1 mmol/L 3.1  3.3    Chloride 98 - 111 mmol/L 101  98    CO2 22 - 32 mmol/L 28  30    Calcium 8.9 - 10.3 mg/dL 9.0  9.0     Increase KCL to 1mq TID- recheck prior to discharge   8.  Hypothyroidism.  Synthroid 9.  Diabetes mellitus.  Hemoglobin A1c 8.3.  Currently on SSI.  Patient was on Levemir 39 units twice daily prior to admission as well as Farxiga 10 mg daily and Trulicity 3 mg every Thursday.  Resume as needed\ CBG (last 3)  Recent Labs     05/14/22 1652 05/14/22 2119 05/15/22 0603  GLUCAP 245* 277* 156*   Restart levimir, 15U qhs 2/20, increase to 25U on 2/21,  increase to 35 U on 2/22, increased to 45 units on 2/24; 55 U 2/25,  Increase to 60U on 2/26  Restart farxiga '5mg'$   tomorrow to address daytime cbgs, titrate up as needed   10.  Hypertension.  Coreg 12.5 mg twice daily, HCTZ 25 mg daily.  Monitor with increased mobility Vitals:   05/15/22 0524 05/15/22 0731  BP: (!) 165/79 135/67  Pulse: (!) 55 60  Resp: 16   Temp: 98 F (36.7 C)   SpO2: 99%   Some lability will monitor prior to dosage change   - normotensive, monitor 2/26  11.  Hyperlipidemia.  Crestor 12.  Urinary incontinence  will try to wean off condom cath     LOS: 8 days A FACE TO FACE EVALUATION WAS PERFORMED  Joseph Ortega 05/15/2022, 8:44 AM

## 2022-05-15 NOTE — Progress Notes (Signed)
Occupational Therapy Weekly Progress Note  Patient Details  Name: Katherine Arias MRN: [id number] Date of Birth: 1955/04/28  Beginning of progress report period: May 08, 2022 End of progress report period: May 15, 2022  Today's Date: 05/15/2022 OT Individual Time: [id number] OT Individual Time Calculation (min): 57 min    Patient has met 4 of 4 short term goals.  Pt is demonstrating increased independence in completing U/LB BADLs d/t increased functional use of LU/LLE, increased independence using hemi-techniques, and increased U/LB strength for BADLs and IADLs. Pt is able to complete UB dressing with in A, UB bathing   Patient continues to demonstrate the following deficits: muscle weakness and muscle joint tightness, decreased cardiorespiratoy endurance, impaired timing and sequencing, abnormal tone, unbalanced muscle activation, decreased coordination, and decreased motor planning, decreased attention, decreased awareness, decreased problem solving, decreased safety awareness, decreased memory, and delayed processing, L visual field deficit, and decreased sitting balance, decreased standing balance, decreased postural control, hemiplegia, and decreased balance strategies and therefore will continue to benefit from skilled OT intervention to enhance overall performance with BADL, iADL, and Reduce care partner burden.  Patient progressing toward long term goals..  Continue plan of care.  OT Short Term Goals Week 1:  OT Short Term Goal 1 (Week 1): Complete UB self care with S using hemi techniques OT Short Term Goal 1 - Progress (Week 1): Met OT Short Term Goal 2 (Week 1): Complete LB self care using hemi techniques with CGA OT Short Term Goal 2 - Progress (Week 1): Met OT Short Term Goal 3 (Week 1): Amb for toilet transfer with RW with CGA OT Short Term Goal 3 - Progress (Week 1): Met OT Short Term Goal 4 (Week 1): Pt will stand for oral care or kitchen sink task for up to 5 min with  CGA OT Short Term Goal 4 - Progress (Week 1): Met Week 2:  OT Short Term Goal 1 (Week 2): STG=LTG d/t Pt ELOS  Skilled Therapeutic Interventions/Progress Updates:     Pt received sitting up in wc presenting to be in good spirits and receptive to skilled OT session. Pt reporting back pain at beginning and during session with repositioning, rest breaks and gentle stretching/massage provided.   Pt ambulated to therapy gym using RW for functional mobility and endurance training with OT providing tactile cues for weight shifting And VB cueing for step length and trunk positioning. Pt able to complete task with CGA +time with single rest break in standing d/t back pain.   Pt completed standing LUE NMR-education activities to increase functional use of LUE with focus on functional movement patterns. Pt completed 2x10 reps of shoulder glides with OT facilitating scapular rotation and HOH A during initial trial and Pt providing self-HOH A during second trial. Pt completed 1x5 scapular protraction/retraction in modified push-up position standing at wall. Pt reporting significant pain in lower back following with gentle massage and rest break provided. Activities terminated prematurely to prevent increase in pain.   Pt ambulated to EOM using RW close supervision. Pt educated on gentle stretches to complete to accommodate for pain. Pt provided demonstration and skilled feedback during stretches to support correct body mechanics and prevent injury. Pt able to complete chest openers, modified cat/cows with hands supported on table top, and torso twists with mod tactile/VB cueing provided.   Pt transported back to room total A in wc d/t pain and Pt fatigue at end of session. Spoke with Rn on the way back to Pt room  to request pain medications for Pt per pt request. Pt completed stand step wc>bed and returned to bed with supervision +time. Pt left resting in bed with call bell in reach, bed alarm on, and all needs  met.   Therapy Documentation Precautions:  Precautions Precautions: Fall Precaution Comments: L sided weakness; BP <150 Restrictions Weight Bearing Restrictions: No Other Position/Activity Restrictions: Needs Loop Monitor cardiac device wiht him in therapy ADL: ADL Equipment Provided: Long-handled sponge Eating: Set up Where Assessed-Eating: Bed level Grooming: Moderate assistance Where Assessed-Grooming: Sitting at sink Upper Body Bathing: Moderate assistance Where Assessed-Upper Body Bathing: Sitting at sink Lower Body Bathing: Maximal assistance Where Assessed-Lower Body Bathing: Sitting at sink, Standing at sink Upper Body Dressing: Moderate assistance Where Assessed-Upper Body Dressing: Sitting at sink Lower Body Dressing: Maximal assistance Where Assessed-Lower Body Dressing: Sitting at sink, Standing at sink Toileting: Minimal assistance Where Assessed-Toileting: Glass blower/designer: Moderate assistance Toilet Transfer Method: Counselling psychologist: Bedside commode, Energy manager: Maximal Firefighter Method: Radiographer, therapeutic: Radio broadcast assistant ADL Comments: min A amb with RW with limited L grasp- now with L walker splint, min A UB self care, mod a LB self care pull on clothing  Therapy/Group: Individual Therapy  Janey Genta 05/15/2022, 2:17 PM

## 2022-05-15 NOTE — Progress Notes (Signed)
Physical Therapy Session Note  Patient Details  Name: Joseph Ortega MRN: [id number] Date of Birth: April 27, 1955  Today's Date: 05/15/2022 PT Individual Time: [id number] PT Individual Time Calculation (min): 42 min   Short Term Goals: Week 1:  PT Short Term Goal 1 (Week 1): Pt will perform sit<>stand with CGA using RW. PT Short Term Goal 2 (Week 1): pt will perform stand pivot to/ from recliner with CGA/ MinA using RW PT Short Term Goal 3 (Week 1): Pt will perform dynamic standing/ balance activities with no AD and MinA. PT Short Term Goal 4 (Week 1): pt will ambulate longer distances using RW and improved LLE foot clearance. PT Short Term Goal 5 (Week 1): Pt will demonstrate decreased hip hike during backward step with LLE.  Skilled Therapeutic Interventions/Progress Updates:   Pt received supine in bed asleep with CPAP in place. Pt easily awakens and is agreeable to therapy session. Immediately upon initiating OOB mobility pt reports onset of significant low back pain. Therapist providing max cuing for sequential logroll technique to increase pt independence while decreasing pain - min assist for trunk upright. Sitting EOB, donned L LE [place]-Walk-on PLS AFO total assist for time management. Pt reports this onset of back pain is new with no known of precipitating factors, but pt suspects it may be the hospital bed.  Sit>stand from slightly elevated EOB to no AD with CGA - pt using backs of legs against bed for balance.  Gait training ~164f to main therapy gym using +2 R HHA and L UE support over therapists's shoulders with +2 min assist for balance. Pt demonstrating the following gait deviations with therapist providing the described cuing and facilitation for improvement:  - continued narrow BOS, especially in beginning that improves with cuing - continued lack of forward L pelvic progression during L stance phase along with poor L LE hip stability during stance with minor hip drop -  decreased B LE step lengths that improve with improved L stance Pt still with minor low back pain during this gait trial but not significant.  Pt expresses concerns regarding his need to reach mod-I level prior to D/C. As well as the fact that he will no longer have insurance on April 1st. Therapist provided pt with probono clinic information and will notify SW of this to help with D/C planning.  Therapist reinforced education on plan for AFO consult tomorrow and provided pt with AFO care handout.   Gait training ~1534fback to his room using +2 R HHA and LUE support over therapists's shoulders with +2 mn assist for balance and L stance control facilitation - pt experiencing increased low back pain this gait trial compared to earlier therefore therapist and +2 providing increased support for pain management, but pt declining offer to retrieve wheelchair for transport back to room, stating he wanted to continue working.   Pt requesting to sit EOB at end of session for pain management, to allow him to stretch his back - notified NT and pt left with needs in reach.   Therapy Documentation Precautions:  Precautions Precautions: Fall Precaution Comments: L sided weakness; BP <150 Restrictions Weight Bearing Restrictions: No Other Position/Activity Restrictions: Needs Loop Monitor cardiac device wiht him in therapy   Pain: Reports significant onset of low back pain upon initiating OOB mobility - pt reports premedicated - provided modifications in treatment for pain management.   Therapy/Group: Individual Therapy  CaTawana Scale PT, DPT, NCS, CSRS 05/15/2022, 8:26 PM

## 2022-05-16 LAB — GLUCOSE, CAPILLARY
Glucose-Capillary: 78 mg/dL (ref 70–99)
Glucose-Capillary: 82 mg/dL (ref 70–99)
Glucose-Capillary: 154 mg/dL — ABNORMAL HIGH (ref 70–99)
Glucose-Capillary: 172 mg/dL — ABNORMAL HIGH (ref 70–99)
Glucose-Capillary: 207 mg/dL — ABNORMAL HIGH (ref 70–99)

## 2022-05-16 MED ORDER — TROLAMINE SALICYLATE 10 % EX CREA: TOPICAL_CREAM | Freq: Two times a day (BID) | CUTANEOUS | Status: DC | PRN

## 2022-05-16 MED ORDER — INSULIN DETEMIR 100 UNIT/ML ~~LOC~~ SOLN
57.0000 [IU] | Freq: Every day | SUBCUTANEOUS | Status: DC
  Administered 2022-05-16 – 2022-05-18 (×3): 57 [IU] via SUBCUTANEOUS
  Filled 2022-05-16 (×4): qty 0.57

## 2022-05-16 NOTE — Plan of Care (Signed)
  Problem: Consults Goal: RH STROKE PATIENT EDUCATION Description: See Patient Education module for education specifics  Outcome: Progressing   Problem: RH SAFETY Goal: RH STG ADHERE TO SAFETY PRECAUTIONS W/ASSISTANCE/DEVICE Description: STG Adhere to Safety Precautions With cues Assistance/Device. Outcome: Progressing   Problem: RH PAIN MANAGEMENT Goal: RH STG PAIN MANAGED AT OR BELOW PT'S PAIN GOAL Description: < 4 with prns Outcome: Progressing   Problem: RH KNOWLEDGE DEFICIT Goal: RH STG INCREASE KNOWLEDGE OF DIABETES Description: Patient and Spouse will be able to manage DM with medications and dietary modifications using educational resources independently Outcome: Progressing Goal: RH STG INCREASE KNOWLEDGE OF HYPERTENSION Description: Patient and S.O. will be able to manage HTN with medications and dietary modifications using educational resources independently Outcome: Progressing Goal: RH STG INCREASE KNOWLEGDE OF HYPERLIPIDEMIA Description: Patient and S.O. 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 will be able to manage secondary risks with medications and dietary modifications using educational resources independently Outcome: Progressing   Problem: Education: Goal: Knowledge of disease or condition will improve Outcome: Progressing Goal: Knowledge of secondary prevention will improve (MUST DOCUMENT ALL) Outcome: Progressing Goal: Knowledge of patient specific risk factors will improve Elta [place] N/A or DELETE if not current risk factor) Outcome: Progressing   Problem: Intracerebral Hemorrhage Tissue Perfusion: Goal: Complications of Intracerebral Hemorrhage will be minimized Outcome: Progressing   Problem: Coping: Goal: Will verbalize positive feelings about self Outcome: Progressing Goal:  Will identify appropriate support needs Outcome: Progressing   Problem: Health Behavior/Discharge Planning: Goal: Ability to manage health-related needs will improve Outcome: Progressing Goal: Goals will be collaboratively established with patient/family Outcome: Progressing   Problem: Self-Care: Goal: Ability to participate in self-care as condition permits will improve Outcome: Progressing Goal: Verbalization of feelings and concerns over difficulty with self-care will improve Outcome: Progressing Goal: Ability to communicate needs accurately will improve Outcome: Progressing   Problem: Nutrition: Goal: Risk of aspiration will decrease Outcome: Progressing Goal: Dietary intake will improve Outcome: Progressing

## 2022-05-16 NOTE — Patient Care Conference (Signed)
Inpatient RehabilitationTeam Conference and Plan of Care Update Date: 05/16/2022   Time: 10:49 AM    Patient Name: Joseph Ortega      Medical Record Number: [id number]  Date of Birth: 1955-05-08 Sex: Male         Room/Bed: 4W06C/4W06C-01 Payor Info: Payor: [place] / Plan: [employer] COMM PPO / Product Type: *No Product type* /    Admit Date/Time:  05/07/2022  8:36 PM  Primary Diagnosis:  Right middle cerebral artery stroke [facility])  Hospital Problems: Principal Problem:   Right middle cerebral artery stroke [facility])    Expected Discharge Date: Expected Discharge Date: 05/24/22  Team Members Present: Physician leading conference: Dr. Alysia Penna Social Worker Present: Erlene Quan, BSW Nurse Present: Dorien Chihuahua, RN PT Present: Page Spiro, PT OT Present: Other (comment) Lowella Fairy, OT) SLP Present: Sherren Kerns, SLP PPS Coordinator present : Gunnar Fusi, SLP     Current Status/Progress Goal Weekly Team Focus  Bowel/Bladder   Pt continent B/B  LBM  05/15/22   Will maintain normal B/B pattern   Assist with toileting qshift/prn    Swallow/Nutrition/ Hydration               ADL's   UB bathing supervision, LB bathing CGA, UB dressing supervision, LB dressing CGA, grooming/heygene supervision, toilet/shower transfer CGA using RW, CGA IADL tasks-demonstrating increased use of LUE, new onset of back pain starting 02/27 limiting participation in session   S/CGA overall   L NMR-education, balance training, activity tolerance, BADL retraining, IADL retraining, compensatory strategies for L VFD, functional mobility training,    Mobility   min assist bed mobility without using bed features, CGA sit<>stand and stand pivot transfers using RW, CGA gait up to 153f using RW vs +2 min assist without AD, min assist stair navigation using HR - planning for AFO consult on 2/28  upgraded to mod-I household level due to pt's wife being the only support and she uses a  wheelchair for mobility  L hemibody NMR, dynamic standing balance, activity tolerance, dynamic gait training, stair navigation, pt education, AFO consultation    Communication                Safety/Cognition/ Behavioral Observations  level of assist is task dependent, ranging from sup A-to-mod A, more support needed for executive functioning and complex tasks   mod I-to-sup A   memory, problem solving and executive functioning with iADLs, education    Pain   Denies pain at this time   Will be free from pain   Assess for pain qshift/prn    Skin   Skin is intact with bruising to bilateral arm   Will maintain skin intergrity  Assess skin qshift/prn for breakdown      Discharge Planning:  D/c home with spouse able to provide supervision   Team Discussion: Patient with previous stroke, HTN now post right MCA CVA with left neglect, trouble with calculations, executive tasks,  and problem solving. Notes back spasms after sitting for a while and odd gait post CVA.  Patient on target to meet rehab goals: yes, currently needs supervision for upper body ADLs and CGA for lower body care. Completes toileting and shower transfers with CGA. Needs supervision for sit - stand/stand pivots and able to ambulate up to 150' with CGA.  Goals for discharge set for supervision overall.  *See Care Plan and progress notes for long and short-term goals.   Revisions to Treatment Plan:  AFO consult  Teaching Needs: Safety, medications/management, loop recorder care, dietary modifications, transfers, etc.  Current Barriers to Discharge: Decreased caregiver support and Home enviroment access/layout  Possible Resolutions to Barriers: Family education OP follow up services DME: RW    Medical Summary Current Status: HTN improving, LUE strength improving  Barriers to Discharge: Uncontrolled Hypertension;Uncontrolled Diabetes   Possible Resolutions to [employer] Focus: adjust diabetic meds  and antihypertensive meds   Continued Need for Acute Rehabilitation Level of Care: The patient requires daily medical management by a physician with specialized training in physical medicine and rehabilitation for the following reasons: Direction of a multidisciplinary physical rehabilitation program to maximize functional independence : Yes Medical management of patient stability for increased activity during participation in an intensive rehabilitation regime.: Yes Analysis of laboratory values and/or radiology reports with any subsequent need for medication adjustment and/or medical intervention. : Yes   I attest that I was present, lead the team conference, and concur with the assessment and plan of the team.   Dorien Chihuahua B 05/16/2022, 3:52 PM

## 2022-05-16 NOTE — Progress Notes (Signed)
Occupational Therapy Session Note  Patient Details  Name: Joseph Ortega MRN: [id number] Date of Birth: [birth year]  Today's Date: 05/16/2022 OT Individual Time: 1107-1201 OT Individual Time Calculation (min): 54 min    Short Term Goals: Week 2:  OT Short Term Goal 1 (Week 2): STG=LTG d/t Pt ELOS  Skilled Therapeutic Interventions/Progress Updates:     Pt received supine in bed presenting to be in good spirits and receptive to skilled OT session reporting 4/10 pain in back- OT offering increased rest breaks, repositioning, and gentle stretching to decrease pain and maximize participation in session. Pt requesting to take shower this session. Pt transitioned to EOB with min A to lift trunk +time to prevent pain with HOB elevated 45 degrees. Pt doffed clothing in sitting and stood to bring pants off waist using RW with close supervision +time. Pt ambulated to bathroom close supervision and transferred to tub bench in walk-in shower using grab bars with CGA and min cueing required for technique. Pt encouraged to utilized LUE during bathing tasks with re-education provided on HOH technique. Pt able to utilize LUE ~25% of the time. Long handled sponge used to wash BLEs with supervision. Pt stood using grab bar close supervision to wash bottom and anterior peri-area with cueing required for spatial awareness and safety. Pt transferred off tub bench using grab bars CGA, however during transfer Pt presenting with significant increase in back pain 2/2 spasm with standing rest break, gentle massage, and emotional encouragement provided. Ambulated to EOB close supervision using RW with VB cueing provided for step length/height. Pt completed UB dressing EOB using hemi-dressing technique supervision, mod cueing required for orienting shirt. Donned pants utilizing hemi-dressing technique CGA for safety in standing with increased use os LUE noted when bringing pants to waist. Pt donned socks using hemi technique with  mod A d/t fatigue. Sit>supine CGA for safety. Pt was left resting in bed with call bell in reach, bed alarm on, and all needs met.   (LTG goals upgraded today following team conference d/t increasing Pt  length of stay and receiving new information that Pt needs to be mod I at home as he care gives for wife who use wc and requires min physical assistance for ADLs)  Therapy Documentation Precautions:  Precautions Precautions: Fall Precaution Comments: L sided weakness; BP <150 Restrictions Weight Bearing Restrictions: No Other Position/Activity Restrictions: Needs Loop Monitor cardiac device wiht him in therapy   Therapy/Group: Individual Therapy  Janey Genta 05/16/2022, 11:22 AM

## 2022-05-16 NOTE — Progress Notes (Signed)
PROGRESS NOTE   Subjective/Complaints:  No issues overnite   ROS- denies CP, SOB, N/V/D Objective:   No results found. No results for input(s): "WBC", "HGB", "HCT", "PLT" in the last 72 hours.  Recent Labs    05/14/22 0714  NA 137  K 3.1*  CL 101  CO2 28  GLUCOSE 188*  BUN 19  CREATININE 0.95  CALCIUM 9.0     Intake/Output Summary (Last 24 hours) at 05/16/2022 0850 Last data filed at 05/16/2022 0700 Gross per 24 hour  Intake 696 ml  Output 1350 ml  Net -654 ml         Physical Exam: Vital Signs Blood pressure 136/70, pulse 60, temperature (!) 97.5 F (36.4 C), temperature source Oral, resp. rate 16, weight 81.9 kg, SpO2 100 %.   General: No acute distress Mood and affect are appropriate Heart: Regular rate and rhythm no rubs murmurs or extra sounds Lungs: Clear to auscultation, breathing unlabored, no rales or wheezes.  Abdomen: Positive bowel sounds, soft nontender to palpation, nondistended Extremities: No clubbing, cyanosis, or edema Skin: No evidence of breakdown, no evidence of rash Neurologic: Cranial nerves II through XII intact, motor strength is 5/5 in right and 3- left deltoid, bicep, tricep, grip, 5/5 right and 4-/5 left hip flexor, knee extensors, did not test ankle dorsiflexor and plantar flexor due to AFO and PT in progress  Cerebellar exam deferred due to weakness on left  Musculoskeletal: Full range of motion in all 4 extremities. No joint swelling   Assessment/Plan: 1. Functional deficits which require 3+ hours per day of interdisciplinary therapy in a comprehensive inpatient rehab setting. Physiatrist is providing close team supervision and 24 hour management of active medical problems listed below. Physiatrist and rehab team continue to assess barriers to discharge/monitor patient progress toward functional and medical goals  Care Tool:  Bathing    Body parts bathed by patient:  Left arm, Chest, Abdomen, Front perineal area, Right upper leg, Left upper leg, Face   Body parts bathed by helper: Right arm, Buttocks, Left lower leg, Right lower leg     Bathing assist Assist Level: Moderate Assistance - Patient 50 - 74%     Upper Body Dressing/Undressing Upper body dressing   What is the patient wearing?: Pull over shirt    Upper body assist Assist Level: Moderate Assistance - Patient 50 - 74%    Lower Body Dressing/Undressing Lower body dressing      What is the patient wearing?: Underwear/pull up, Pants     Lower body assist Assist for lower body dressing: Maximal Assistance - Patient 25 - 49%     Toileting Toileting    Toileting assist Assist for toileting: Moderate Assistance - Patient 50 - 74%     Transfers Chair/bed transfer  Transfers assist     Chair/bed transfer assist level: Moderate Assistance - Patient 50 - 74%     Locomotion Ambulation   Ambulation assist      Assist level: Minimal Assistance - Patient > 75% Assistive device: Walker-rolling Max distance: 200 ft   Walk 10 feet activity   Assist     Assist level: Moderate Assistance - Patient - 27 -  74% Assistive device: No Device   Walk 50 feet activity   Assist    Assist level: Minimal Assistance - Patient > 75% Assistive device: Walker-rolling    Walk 150 feet activity   Assist    Assist level: Minimal Assistance - Patient > 75% Assistive device: Walker-rolling    Walk 10 feet on uneven surface  activity   Assist Walk 10 feet on uneven surfaces activity did not occur: Safety/medical concerns         Wheelchair     Assist Is the patient using a wheelchair?: Yes Type of Wheelchair: Manual    Wheelchair assist level: Maximal Assistance - Patient 25 - 49% Max wheelchair distance: 60 ft    Wheelchair 50 feet with 2 turns activity    Assist        Assist Level: Maximal Assistance - Patient 25 - 49%   Wheelchair 150 feet  activity     Assist      Assist Level: Total Assistance - Patient < 25%   Blood pressure 136/70, pulse 60, temperature (!) 97.5 F (36.4 C), temperature source Oral, resp. rate 16, weight 81.9 kg, SpO2 100 %.  Medical Problem List and Plan: 1. 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 Had a prior Left hemiparesis but has more weakness since most recent CVA              -patient may  shower             -ELOS/Goals: 10-12d Supervision, cont PT, OT, Team conference today please see physician documentation under team conference tab, met with team  to discuss problems,progress, and goals. Formulized individual treatment plan based on medical history, underlying problem and comorbidities.    2.  Antithrombotics: -DVT/anticoagulation:  Pharmaceutical: Heparin- switch to lovenox , no renal disease              -antiplatelet therapy: Aspirin 81 mg daily resumed on 05/07/2022 3. Pain Management: Topamax 50 mg daily for headache 4. Mood/Behavior/Sleep: Provide emotional support             -antipsychotic agents: N/A 5. Neuropsych/cognition: This patient is capable of making decisions on his own behalf. 6. Skin/Wound Care: Routine skin checks 7. Fluids/Electrolytes/Nutrition: Routine in and outs with follow-up chemistries hypoK+ on HCTZ start daily KCL,     Latest Ref Rng & Units 05/14/2022    7:14 AM 05/08/2022    5:36 AM 05/07/2022   10:23 PM  BMP  Glucose 70 - 99 mg/dL 188  244    BUN 8 - 23 mg/dL 19  17    Creatinine 0.61 - 1.24 mg/dL 0.95  1.14  1.36   Sodium 135 - 145 mmol/L 137  136    Potassium 3.5 - 5.1 mmol/L 3.1  3.3    Chloride 98 - 111 mmol/L 101  98    CO2 22 - 32 mmol/L 28  30    Calcium 8.9 - 10.3 mg/dL 9.0  9.0     Increase KCL to 73mq TID- recheck prior to discharge   8.  Hypothyroidism.  Synthroid 9.  Diabetes mellitus.  Hemoglobin A1c 8.3.  Currently on SSI.  Patient was on Levemir 39 units twice  daily prior to admission as well as Farxiga 10 mg daily and Trulicity 3 mg every Thursday.  Resume as needed\ CBG (last 3)  Recent Labs    05/15/22 2102 05/16/22 0548 05/16/22 0613  GLUCAP 224* 78  82   Restart levimir, 15U qhs 2/20, increase to 25U on 2/21, increase to 35 U on 2/22, increased to 45 units on 2/24; 55 U 2/25,  Increase to 60U on 2/26 - am CBG on low side reduce to 57U Restart farxiga '5mg'$   tomorrow to address daytime cbgs, titrate up as needed   10.  Hypertension.  Coreg 12.5 mg twice daily, HCTZ 25 mg daily.  Monitor with increased mobility Vitals:   05/16/22 0432 05/16/22 0717  BP: (!) 155/75 136/70  Pulse: (!) 57 60  Resp: 16   Temp: (!) 97.5 F (36.4 C)   SpO2: 100%   Some lability will monitor prior to dosage change   - normotensive, monitor 2/26  11.  Hyperlipidemia.  Crestor 12.  Urinary incontinence  will try to wean off condom cath     LOS: 9 days A FACE TO FACE EVALUATION WAS PERFORMED  Charlett Blake 05/16/2022, 8:50 AM

## 2022-05-16 NOTE — Progress Notes (Addendum)
Patient ID: Joseph Ortega, male   DOB: 1955-08-06, 67 y.o.   MRN: [id number]  Rolling Walker and [facility] ordered through [place].   2/28 2:23 PM:  Patient has an updated preference of OP.

## 2022-05-16 NOTE — Progress Notes (Addendum)
Speech Language Pathology Daily Session Note  Patient Details  Name: Joseph Ortega MRN: [id number] Date of Birth: 31-Jan-1956  Today's Date: 05/16/2022 SLP Individual Time: [id number] SLP Individual Time Calculation (min): 45 min  Short Term Goals: Week 2: SLP Short Term Goal 1 (Week 2): Patient will recall and use memory compensations with sup A verbal cues for effectiveness SLP Short Term Goal 2 (Week 2): Patient will complete complex problem solving with iADL tasks including medication and/or money management with sup A verbal cues SLP Short Term Goal 3 (Week 2): Patient will complete scheduling and/or organization tasks with sup A verbal cues  Skilled Therapeutic Interventions: Pt seen this date for skilled ST intervention targeting cognitive goals outlined above. Pt received alert/awake and sitting upright in bed. Agreeable to intervention at bedside.  Today's session with emphasis on cognitive skill training within the context of functional iADL related tasks (e.g. money management). Pt continues to report "tremendous" improvement in awareness and recall of current medications, and feels very accomplished by this. Recalled scheduled medications with 100% accuracy given overall Sup-Min A verbal cues. When given choices and pt's preference, pt requested to work on money management tasks as it relates to reading and interpreting a menu. Pt answered questions re: math calculations based on menu pricing with 100% accuracy given Min A, faded to Sup A for accurate use of calculation and utilizing strategies reviewed yesterday (2/27) which pt recalled with Sup-Min A verbal cues. Benefited from Sup A for working memory and attention to task for up to 30 minutes with no cues for redirection needed. Pt voiced appreciation for therapy. Pt benefited from Total A to move calculator application to home screen and activate "[employer]chet" to aid in phone navigation; pt appreciative of this and states he feels like this  will assist in easing access to phone applications.   Upon inquiry, reports that he does not have access to MyChart for [facility], though chart does indicate he has an account. Pt interested in looking into this. Will plan to address problem-solving within the context of gaining access to his MyChart account again, and revisiting pill organizer to improve fluency and competence with this task - he verbalized understanding and is in agreement.  Pt left in room and in bed with all safety measures activated, call bell within reach, and all immediate needs met. Continue per current ST POC.  Pain No pain reported; NAD  Therapy/Group: Individual Therapy  Shellie Rogoff A Vetra Shinall 05/16/2022, 12:09 PM

## 2022-05-16 NOTE — Plan of Care (Signed)
  Problem: RH Balance Goal: LTG Patient will maintain dynamic standing with ADLs (OT) Description: LTG:  Patient will maintain dynamic standing balance with assist during activities of daily living (OT)  Flowsheets (Taken 05/16/2022 1216) LTG: Pt will maintain dynamic standing balance during ADLs with: Independent with assistive device Note: LTG goals upgraded following team conference d/t increasing Pt  length of stay and receiving new information that Pt needs to be mod I at home as he care gives for wife who use wc and requires min physical assistance for ADLs   Problem: Sit to Stand Goal: LTG:  Patient will perform sit to stand in prep for activites of daily living with assistance level (OT) Description: LTG:  Patient will perform sit to stand in prep for activites of daily living with assistance level (OT) Flowsheets (Taken 05/16/2022 1216) LTG: PT will perform sit to stand in prep for activites of daily living with assistance level: Independent with assistive device Note: LTG goals upgraded following team conference d/t increasing Pt  length of stay and receiving new information that Pt needs to be mod I at home as he care gives for wife who use wc and requires min physical assistance for ADLs   Problem: RH Simple Meal Prep Goal: LTG Patient will perform simple meal prep w/assist (OT) Description: LTG: Patient will perform simple meal prep with assistance, with/without cues (OT). 05/16/2022 1219 by Janey Genta, OT Flowsheets (Taken 05/16/2022 1219) LTG: Pt will perform simple meal prep with assistance level of: Independent with assistive device 05/16/2022 1216 by Janey Genta, OT Note: LTG goals upgraded following team conference d/t increasing Pt  length of stay and receiving new information that Pt needs to be mod I at home as he care gives for wife who use wc and requires min physical assistance for ADLs   Problem: RH Toilet Transfers Goal: LTG Patient will perform toilet transfers  w/assist (OT) Description: LTG: Patient will perform toilet transfers with assist, with/without cues using equipment (OT) Flowsheets (Taken 05/16/2022 1216) LTG: Pt will perform toilet transfers with assistance level of: Independent with assistive device Note: LTG goals upgraded following team conference d/t increasing Pt  length of stay and receiving new information that Pt needs to be mod I at home as he care gives for wife who use wc and requires min physical assistance for ADLs   Problem: RH Tub/Shower Transfers Goal: LTG Patient will perform tub/shower transfers w/assist (OT) Description: LTG: Patient will perform tub/shower transfers with assist, with/without cues using equipment (OT) Flowsheets (Taken 05/16/2022 1216) LTG: Pt will perform tub/shower stall transfers with assistance level of: Independent with assistive device Note: LTG goals upgraded following team conference d/t increasing Pt  length of stay and receiving new information that Pt needs to be mod I at home as he care gives for wife who use wc and requires min physical assistance for ADLs

## 2022-05-16 NOTE — Progress Notes (Signed)
Patient ID: Joseph Ortega, male   DOB: 01-10-1956, 67 y.o.   MRN: [id number]  Team Conference Report to Patient/Family  Team Conference discussion was reviewed with the patient and caregiver, including goals, any changes in plan of care and target discharge date.  Patient and caregiver express understanding and are in agreement.  The patient has a target discharge date of 05/24/22.  Sw met with patient and shared conference updates. Patient expressed still have back spasms. Patient goals potential to upgrade to MOD I. Patient will need a RW, and requesting a Wide bedside commode. SW will order through [employer]. Patient would prefer HH. No additional questions or concerns.   Dyanne Iha 05/16/2022, 1:35 PM

## 2022-05-16 NOTE — Progress Notes (Signed)
Physical Therapy Weekly Progress Note  Patient Details  Name: Joseph Ortega MRN: [id number] Date of Birth: February 26, 1956  Beginning of progress report period: May 08, 2022 End of progress report period: May 16, 2022  Today's Date: 05/16/2022 PT Individual Time: 0905-0950 and 1315-1410 PT Individual Time Calculation (min): 45 min and 55 min   Patient has met 5 of 5 short term goals. Joseph Ortega is progressing well with therapy, demonstrating increasing independence with functional mobility. He is performing supine<>sit using bed features with supervision, sit<>stands and stand pivot transfers using RW with CGA, and ambulating up to 161f using RW with CGA wearing L LE AFO. Planning for AFO consultation tomorrow. He continues to demonstrate L hemiparesis with resultant increased fall risk. Pt has had sudden onset low back pain starting yesterday that is now impacting his participation in higher level dynamic standing and gait tasks. He will benefit from continued CIR level skilled physical therapy to further progress his independence with functional mobility prior to D/Cing home with only supervision support from his wife who uses a wheelchair for mobility.  Patient continues to demonstrate the following deficits muscle weakness, muscle joint tightness, and muscle paralysis, decreased cardiorespiratoy endurance, impaired timing and sequencing, abnormal tone, unbalanced muscle activation, and decreased motor planning, decreased problem solving, decreased memory, and delayed processing, and decreased standing balance, decreased postural control, hemiplegia, and decreased balance strategies and therefore will continue to benefit from skilled PT intervention to increase functional independence with mobility.  Patient progressing toward long term goals..  Continue plan of care.  PT Short Term Goals  Week 1:  PT Short Term Goal 1 (Week 1): Pt will perform sit<>stand with CGA using RW. PT Short Term  Goal 1 - Progress (Week 1): Met PT Short Term Goal 2 (Week 1): pt will perform stand pivot to/ from recliner with CGA/ MinA using RW PT Short Term Goal 2 - Progress (Week 1): Met PT Short Term Goal 3 (Week 1): Pt will perform dynamic standing/ balance activities with no AD and MinA. PT Short Term Goal 3 - Progress (Week 1): Met PT Short Term Goal 4 (Week 1): pt will ambulate longer distances using RW and improved LLE foot clearance. PT Short Term Goal 4 - Progress (Week 1): Met PT Short Term Goal 5 (Week 1): Pt will demonstrate decreased hip hike during backward step with LLE. PT Short Term Goal 5 - Progress (Week 1): Met Week 2:  PT Short Term Goal 1 (Week 2): = to LTGs based on ELOS  Skilled Therapeutic Interventions/Progress Updates:  Ambulation/gait training;Community reintegration;DME/adaptive equipment instruction;Neuromuscular re-education;Psychosocial support;Stair training;UE/LE Strength taining/ROM;Wheelchair propulsion/positioning;UE/LE Coordination activities;Therapeutic Activities;Skin care/wound management;Pain management;Discharge planning;Balance/vestibular training;Cognitive remediation/compensation;Disease management/prevention;Functional mobility training;Patient/family education;Splinting/orthotics;Therapeutic Exercise;Visual/perceptual remediation/compensation;Functional electrical stimulation   Session 1: Pt received supine in bed awake and agreeable to therapy session. Pt reports he took his muscle relaxer and Tylenol as well as used heating pad for pain management and it is improved this AM compared to yesterday - reports "its letting you know it is still there, but it is nothing like yesterday" - therapist modified treatment interventions for pain management.  Supine>sitting R EOB, HOB elevated and using bedrail for pain management, with supervision and increased time/effort - therapist cuing for breathing with movement.  Reminded pt of planned AFO consult today at 1pm.  Sitting EOB, assisted with problem solving how to don L shoe with L LE Ottobock walk-on PLS AFO in shoe, with pt having greatest difficulty due to L ankle inverting when attempting to  lift L LE to place in shoe and inability to correct foot positioning without physical assistance.   Sit>stand elevated EOB>RW with CGA for safety - pt able to place hand on L RW orthosis without assist - continues to use backs of legs on bed for balance while rising.  Gait training ~132f to dayroom using RW with CGA for steadying. Pt demonstrating the following gait deviations with therapist providing the described cuing and facilitation for improvement:  - excessive forward trunk flexed posture - pelvic rotation with L hip posterior to R throughout all phases of gait, despite pt achieving min reciprocal stepping pattern - R trunk elongation and L trunk flexed with rotation towards L  - slower gait speed at this time with pt guarding due to fear of pain onset   Therapist improved height of RW to promote upright posture.  After seated rest break on mat, when pt returned to standing he had onset of "sudden sharp, stabbing pain that goes across bottom of his back" in lumbar region.  Gait training ~ 559fusing RW with CGA at slow, guarded pace to allow pt to manage the pain with only visual demonstration and verbal cuing to improve above gait deviations within pt's pain tolerance - improved upright posture with change in AD height.   Gait training ~15040fack to his room using RW with CGA as just described. Pt reporting need for toileting - NT arriving to assume care of patient.   Session 2: Pt received supine in bed awake and agreeable to therapy session. Pt reports he received team conf updates from Joseph Ortega and he was hoping for a longer LOS with the goal of him progressing to ambulation using quad cane. Therapist educated pt on his current high fall risk and need to use RW with progression towards that goal but is likely  not achievable in this timeframe. Discussed recommendation for OPPT to further advance him towards that LTG and pt reports he can use public transportation at a wheelchair level to get to those appointments - notified Joseph Ortega to follow-up. Pt confirms he has a wheelchair but needs a new RW due to falls resulting in his being bent.   Supine>sitting R EOB, HOB elevated and using bedrail for pain management, with supervision and increased time/effort. Sitting EOB donned shoes total assist for time management.  Planning for AFO consult today; however, CPO requesting to reschedule due to appointment conflict - moved appointment to tomorrow.   L stand pivot EOB>w/c using RW with CGA - pt continues to use backs of legs against bed while rising to stand to maintain balance - cuing to line up correctly with seat prior to initiating sitting.   Transported to/from gym in w/c for time management and energy conservation.  Gait training 150f24fing RW without AFO to assess gait kinematics again and CGA for safety/steadying. Pt demonstrating the following gait deviations with therapist providing the described cuing and facilitation for improvement:  - midfoot landing on L initial contact improved to slight lateral heel contact with verbal cuing but still demoing significant ankle instability with lack of eversion and DF activation placing it at risk for inversion rolling - mild L knee hyperextension during stance - slow, controlled gait speed due to fear of pain and only 1x shooting back pain when L LE adducts too much on initial contact  - min reciprocal stepping pattern  Donned L LE Ottobock Walk-on PLS AFO with heel wedge in shoe to promote improved knee alignment.  Gait training ~140f using +2 3 Musketeer support (attempted R HHA but needing increased support due to back pain) with +2 min assist - continues to demo slow gait speed with decreased though slightly reciprocal stepping pattern, narrow BOS, and  improved L ankle alignment for heel contact - no improvement noted in knee alignment with the wedge.   Transported back to his room - NT arriving to assume care of pt for toileting.  Therapy Documentation Precautions:  Precautions Precautions: Fall Precaution Comments: L sided weakness; BP <150 Restrictions Weight Bearing Restrictions: No Other Position/Activity Restrictions: Needs Loop Monitor cardiac device wiht him in therapy   Pain:  Session 1: Back pain as described above - modified interventions for pain management.  Session 2: Continues to report his back pain "is there" but is not as bad as yesterday and only a few instances of the sudden spike in pain - premedicated - modified interventions for pain management.   Therapy/Group: Individual Therapy  CTawana Scale, PT, DPT, NCS, CSRS 05/16/2022, 7:54 AM

## 2022-05-17 ENCOUNTER — Encounter: Payer: BC Managed Care – PPO | Admitting: Occupational Therapy

## 2022-05-17 ENCOUNTER — Ambulatory Visit: Payer: BC Managed Care – PPO | Admitting: Physical Therapy

## 2022-05-17 LAB — GLUCOSE, CAPILLARY
Glucose-Capillary: 140 mg/dL — ABNORMAL HIGH (ref 70–99)
Glucose-Capillary: 99 mg/dL (ref 70–99)
Glucose-Capillary: 161 mg/dL — ABNORMAL HIGH (ref 70–99)
Glucose-Capillary: 192 mg/dL — ABNORMAL HIGH (ref 70–99)

## 2022-05-17 NOTE — Progress Notes (Signed)
Speech Language Pathology Daily Session Note  Patient Details  Name: Joseph Ortega MRN: [id number] Date of Birth: 02/10/1956  Today's Date: 05/17/2022 SLP Individual Time: 1430-1500 SLP Individual Time Calculation (min): 30 min  Short Term Goals: Week 2: SLP Short Term Goal 1 (Week 2): Patient will recall and use memory compensations with sup A verbal cues for effectiveness SLP Short Term Goal 2 (Week 2): Patient will complete complex problem solving with iADL tasks including medication and/or money management with sup A verbal cues SLP Short Term Goal 3 (Week 2): Patient will complete scheduling and/or organization tasks with sup A verbal cues  Skilled Therapeutic Interventions: Skilled ST treatment focused on cognitive goals. SLP facilitated session by providing min A verbal cues for completion of complex deduction puzzles in regards to alternating attention between various points and problem solving skills. Pt demonstrated excellent carry over of problem solving strategies throughout task. Benefited from intermittent verbal redirection cues for attention to task. Patient was left in bed with alarm activated and immediate needs within reach at end of session. Continue per current plan of care.      Pain  None/denied  Therapy/Group: Individual Therapy  Patty Sermons 05/17/2022, 4:13 PM

## 2022-05-17 NOTE — Progress Notes (Signed)
Occupational Therapy Session Note  Patient Details  Name: Joseph Ortega MRN: [id number] Date of Birth: 1956-03-18  Today's Date: 05/17/2022 OT Individual Time: [id number] OT Individual Time Calculation (min): 58 min    Short Term Goals: Week 2:  OT Short Term Goal 1 (Week 2): STG=LTG d/t Pt ELOS  Skilled Therapeutic Interventions/Progress Updates:     Pt received recline in bed presenting to be in good spirits and receptive to skilled OT session. Pt reporting mild pain in back, however significantly less than yesterday- Pt receiving pain medications prior to session, OT offering intermittent rest breaks, repositioning, and stretching to decrease rest breaks. Pt requesting to completing laundry during session today for increased practice in preparation for d/c home. Pt transitioned to EOB with supervision +time with HOB elevated. OT facilitated dynamic standing and functional mobility tasks with Pt ambulating in room to gather dirty clothing using RW, reaching into low drawers with RUE to retrieve clothing items with close supervision +time. No LOB or SOB noted. Tied laundry bag to front of walker to simulate home set-up with Pt carrying laundry in basket attached to RW. Pt abe to ambulated from room to laundry room with laundry bag using RW close supervision with cueing for provided for step length, trunk positioning, eye gaze, and use of breath. Pt transferred laundry from bag to washer using RUE and LUE support on RW supervision. Pt able to set washer to appropriate setting without assistance.  Pt ambulated to ADL apartment ~300 ft using RW with supervision and rest break provided following. Pt completed simulated cleaning tasks of sweeping and mopping floor using RW with distant supervision. Pt demonstrating intact safety awareness during task and leaning against sturdy counter top/wall for stability when needed. Pt provided seated rest break following.  Pt transported to laundry room total A in wc  for time management and energy conservation. Pt transferred clothing from washing machine to drier in standing with supervision. Pt able to set drier settings and transfer back to wc with supervision. Transported Pt back to room total A in wc. Pt sit>stand stand step to bed supervision using RW. Sit>supine supervision +time. Pt left resting in bed with call bell in reach, bed alarm on, and all needs met.   Therapy Documentation Precautions:  Precautions Precautions: Fall Precaution Comments: L sided weakness; BP <150 Restrictions Weight Bearing Restrictions: No Other Position/Activity Restrictions: Needs Loop Monitor cardiac device wiht him in therapy General:   Vital Signs: Therapy Vitals Pulse Rate: (Abnormal) 55 BP: 118/62 Pain: Pain Assessment Pain Scale: 0-10 Pain Score: 0-No pain ADL: ADL Equipment Provided: Long-handled sponge Eating: Set up Where Assessed-Eating: Bed level Grooming: Moderate assistance Where Assessed-Grooming: Sitting at sink Upper Body Bathing: Moderate assistance Where Assessed-Upper Body Bathing: Sitting at sink Lower Body Bathing: Maximal assistance Where Assessed-Lower Body Bathing: Sitting at sink, Standing at sink Upper Body Dressing: Moderate assistance Where Assessed-Upper Body Dressing: Sitting at sink Lower Body Dressing: Maximal assistance Where Assessed-Lower Body Dressing: Sitting at sink, Standing at sink Toileting: Minimal assistance Where Assessed-Toileting: Glass blower/designer: Moderate assistance Toilet Transfer Method: Counselling psychologist: Bedside commode, Energy manager: Maximal Firefighter Method: Radiographer, therapeutic: Radio broadcast assistant ADL Comments: min A amb with RW with limited L grasp- now with L walker splint, min A UB self care, mod a LB self care pull on clothing  Therapy/Group: Individual Therapy  Janey Genta 05/17/2022, 10:48 AM

## 2022-05-17 NOTE — Progress Notes (Signed)
Physical Therapy Session Note  Patient Details  Name: Joseph Ortega MRN: [id number] Date of Birth: 05/18/1955  Today's Date: 05/17/2022 PT Individual Time: 0810-0920 and [id number] PT Individual Time Calculation (min): 70 min and 38 min  Short Term Goals: Week 2:  PT Short Term Goal 1 (Week 2): = to LTGs based on ELOS  Skilled Therapeutic Interventions/Progress Updates:   Session 1: Pt received supine, awake in bed with nurse present for morning medications. Pt agreeable to therapy session. Pt reports he still "feels" discomfort/mild pain in his back, but no sharp pains.   Performed the following supine stretches:  - lower trunk rotations with therapist facilitating improved rotation towards R side due to L trunk tightness  - single knee to chest x49mn each - therapist facilitating L side with pt reporting this "feels good"  - figure-4 stretch x169m each LE - therapist facilitating L LE  Supine>sitting R EOB, HOB elevated to 30degrees and using bedrail for pain management, with supervision and increased time/effort.   Sit>stand elevated EOB>RW with CGA/close supervision - pt continues to use backs of legs against bed for balance support.   Gait training ~[id number] dayroom using RW with CGA progressing to close supervision assist for safety. Pt demonstrating the following gait deviations with therapist providing the described cuing and facilitation for improvement:  - slow gait speed as pt cautious of on-setting low back pain, but no "sharp" pains during this gait - decreased step lengths though slight reciprocal - continues to lack L pelvis forward progression during stance  Donned litegait harness in standing with B UE support on litegait handle.   Gait training overground ~300f10fx standing rest breaks) using litegait harness providing slight BWS/balance support with assist to drive/steer litegait. Pt demonstrating the following gait deviations with therapist providing the described  cuing and facilitation for improvement:  - had pt push the litegait for increased cardiac demand with therapist assisting with managing it during turns - continues to demo similar gait deviations as above, but plan to allow pt to participate in increased gait training without onset of pain, pain only started towards last ~50ft9fgait - slight reciprocal stepping pattern with slightly increasing step lengths when pt feels comfortable  HR 65bpm at halfway point and 74bpm at the end with pt's reporting a 11/20 on the Borg Rating Scale of Perceived Exertion during gait training activity.  (6= no exertion, 7= extremely light, 9= very light, 11= light, 13= somewhat hard, 15= Hard (Heavy), 17= very hard, 19= extremely hard, 20 = maximal exertion)  Doffed litegait harness. Gait training ~130ft 49f to his room using RW with CGA and pt continuing to demo above gait deviations but improving as pt more comfortable and not fearful of sharp pain occurring.  At end of session, pt left seated on EOB with needs in reach and bed alarm on.   Session 2: Pt received supine in bed and agreeable to therapy session. [name][facility] for AFO consultation. Supine>sitting R EOB, HOB elevated and using bedrail for pain management, with supervision. Donned shoes without AFO to assess gait mechanics and provide information to C[facility] brace options. Sit>stand slightly elevated EOB>RW with CGA/close supervision.   Gait training ~130ft t78fyroom using RW with CGA/close supervision assist for safety. Pt demonstrating the following gait deviations with therapist providing the described cuing and facilitation for improvement: - L midfoot landing on initial contact with slight foot slap and ankle instability placing pt at increased risk for inversion  rolling - reciprocal stepping pattern with pt demonstrating increased step lengths with no reports of low back pain at this time  - continues to have mild L hip instability  during stance phase   Donned L LE Ottobock Walk-on PLS AFO. Gait training ~137f back to room using RW with CGA and pt demonstrating improved L LE gait mechanics via landing with heel strike at initial contact resulting in improved stability and decreased risk for ankle injury.  Discussed pt needing possible referral from MD for specific shoes due to his hx of diabetes with inconsistent swelling in LLE and impaired skin integrity. Recommended pt receive extra depth shoes, in extra wide that have 2 velcro straps to allow pt independence with donning/doffing shoes with an AFO, that is necessary for improved safety with gait.  Pt in agreement with recommendation to receive an Ottobock Walk-on PLS AFO with his greatest concern being able to don it more independently - will continue to address this and it should improve once pt receives larger shoes.  At end of session, pt left seated EOB with needs in reach and bed alarm on.  Therapy Documentation Precautions:  Precautions Precautions: Fall Precaution Comments: L sided weakness; BP <150 Restrictions Weight Bearing Restrictions: No Other Position/Activity Restrictions: Needs Loop Monitor cardiac device wiht him in therapy   Pain:  Session 1: Reports low back pain rated as 3/10 - nurse present at beginning of session for medication administration - modified interventions for pain management throughout.  Session 2: No complaints of pain this session with no instances of grimacing!!    Therapy/Group: Individual Therapy  CTawana Scale, PT, DPT, NCS, CSRS 05/17/2022, 7:49 AM

## 2022-05-17 NOTE — Plan of Care (Signed)
  Problem: Consults Goal: RH STROKE PATIENT EDUCATION Description: See Patient Education module for education specifics  Outcome: Progressing   Problem: RH SAFETY Goal: RH STG ADHERE TO SAFETY PRECAUTIONS W/ASSISTANCE/DEVICE Description: STG Adhere to Safety Precautions With cues Assistance/Device. Outcome: Progressing   Problem: RH PAIN MANAGEMENT Goal: RH STG PAIN MANAGED AT OR BELOW PT'S PAIN GOAL Description: < 4 with prns Outcome: Progressing   Problem: RH KNOWLEDGE DEFICIT Goal: RH STG INCREASE KNOWLEDGE OF DIABETES Description: Patient and Spouse will be able to manage DM with medications and dietary modifications using educational resources independently Outcome: Progressing Goal: RH STG INCREASE KNOWLEDGE OF HYPERTENSION Description: Patient and S.O. will be able to manage HTN with medications and dietary modifications using educational resources independently Outcome: Progressing Goal: RH STG INCREASE KNOWLEGDE OF HYPERLIPIDEMIA Description: Patient and S.O. 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 will be able to manage secondary risks with medications and dietary modifications using educational resources independently Outcome: Progressing   Problem: Education: Goal: Knowledge of disease or condition will improve Outcome: Progressing Goal: Knowledge of secondary prevention will improve (MUST DOCUMENT ALL) Outcome: Progressing Goal: Knowledge of patient specific risk factors will improve Elta [place] N/A or DELETE if not current risk factor) Outcome: Progressing   Problem: Intracerebral Hemorrhage Tissue Perfusion: Goal: Complications of Intracerebral Hemorrhage will be minimized Outcome: Progressing   Problem: Coping: Goal: Will verbalize positive feelings about self Outcome: Progressing Goal:  Will identify appropriate support needs Outcome: Progressing   Problem: Health Behavior/Discharge Planning: Goal: Ability to manage health-related needs will improve Outcome: Progressing Goal: Goals will be collaboratively established with patient/family Outcome: Progressing   Problem: Self-Care: Goal: Ability to participate in self-care as condition permits will improve Outcome: Progressing Goal: Verbalization of feelings and concerns over difficulty with self-care will improve Outcome: Progressing Goal: Ability to communicate needs accurately will improve Outcome: Progressing   Problem: Nutrition: Goal: Risk of aspiration will decrease Outcome: Progressing Goal: Dietary intake will improve Outcome: Progressing

## 2022-05-17 NOTE — Progress Notes (Signed)
PROGRESS NOTE   Subjective/Complaints:  Seen in pt , pt amb in walking harness   Has had LBP associated with prolonged sitting   ROS- denies CP, SOB, N/V/D Objective:   No results found. No results for input(s): "WBC", "HGB", "HCT", "PLT" in the last 72 hours.  No results for input(s): "NA", "K", "CL", "CO2", "GLUCOSE", "BUN", "CREATININE", "CALCIUM" in the last 72 hours.   Intake/Output Summary (Last 24 hours) at 05/17/2022 0824 Last data filed at 05/17/2022 0445 Gross per 24 hour  Intake 236 ml  Output 975 ml  Net -739 ml         Physical Exam: Vital Signs Blood pressure 118/62, pulse (!) 55, temperature 97.8 F (36.6 C), resp. rate 16, weight 81.9 kg, SpO2 99 %.   General: No acute distress Mood and affect are appropriate Heart: Regular rate and rhythm no rubs murmurs or extra sounds Lungs: Clear to auscultation, breathing unlabored, no rales or wheezes.  Abdomen: Positive bowel sounds, soft nontender to palpation, nondistended Extremities: No clubbing, cyanosis, or edema Skin: No evidence of breakdown, no evidence of rash Neurologic: Cranial nerves II through XII intact, motor strength is 5/5 in right and 3- left deltoid, bicep, tricep, grip, 5/5 right and 4-/5 left hip flexor, knee extensors,    Assessment/Plan: 1. Functional deficits which require 3+ hours per day of interdisciplinary therapy in a comprehensive inpatient rehab setting. Physiatrist is providing close team supervision and 24 hour management of active medical problems listed below. Physiatrist and rehab team continue to assess barriers to discharge/monitor patient progress toward functional and medical goals  Care Tool:  Bathing    Body parts bathed by patient: Left arm, Chest, Abdomen, Front perineal area, Right upper leg, Left upper leg, Face   Body parts bathed by helper: Right arm, Buttocks, Left lower leg, Right lower leg      Bathing assist Assist Level: Moderate Assistance - Patient 50 - 74%     Upper Body Dressing/Undressing Upper body dressing   What is the patient wearing?: Pull over shirt    Upper body assist Assist Level: Moderate Assistance - Patient 50 - 74%    Lower Body Dressing/Undressing Lower body dressing      What is the patient wearing?: Underwear/pull up, Pants     Lower body assist Assist for lower body dressing: Maximal Assistance - Patient 25 - 49%     Toileting Toileting    Toileting assist Assist for toileting: Moderate Assistance - Patient 50 - 74%     Transfers Chair/bed transfer  Transfers assist     Chair/bed transfer assist level: Moderate Assistance - Patient 50 - 74%     Locomotion Ambulation   Ambulation assist      Assist level: Minimal Assistance - Patient > 75% Assistive device: Walker-rolling Max distance: 200 ft   Walk 10 feet activity   Assist     Assist level: Moderate Assistance - Patient - 50 - 74% Assistive device: No Device   Walk 50 feet activity   Assist    Assist level: Minimal Assistance - Patient > 75% Assistive device: Walker-rolling    Walk 150 feet activity   Assist  Assist level: Minimal Assistance - Patient > 75% Assistive device: Walker-rolling    Walk 10 feet on uneven surface  activity   Assist Walk 10 feet on uneven surfaces activity did not occur: Safety/medical concerns         Wheelchair     Assist Is the patient using a wheelchair?: Yes Type of Wheelchair: Manual    Wheelchair assist level: Maximal Assistance - Patient 25 - 49% Max wheelchair distance: 60 ft    Wheelchair 50 feet with 2 turns activity    Assist        Assist Level: Maximal Assistance - Patient 25 - 49%   Wheelchair 150 feet activity     Assist      Assist Level: Total Assistance - Patient < 25%   Blood pressure 118/62, pulse (!) 55, temperature 97.8 F (36.6 C), resp. rate 16, weight 81.9  kg, SpO2 99 %.  Medical Problem List and Plan: 1. 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 Had a prior Left hemiparesis but has more weakness since most recent CVA              -patient may  shower             -ELOS/Goals: 05/24/2022    2.  Antithrombotics: -DVT/anticoagulation:  Pharmaceutical: Heparin- switch to lovenox , no renal disease              -antiplatelet therapy: Aspirin 81 mg daily resumed on 05/07/2022 3. Pain Management: Topamax 50 mg daily for headache 4. Mood/Behavior/Sleep: Provide emotional support             -antipsychotic agents: N/A 5. Neuropsych/cognition: This patient is capable of making decisions on his own behalf. 6. Skin/Wound Care: Routine skin checks 7. Fluids/Electrolytes/Nutrition: Routine in and outs with follow-up chemistries hypoK+ on HCTZ start daily KCL,     Latest Ref Rng & Units 05/14/2022    7:14 AM 05/08/2022    5:36 AM 05/07/2022   10:23 PM  BMP  Glucose 70 - 99 mg/dL 188  244    BUN 8 - 23 mg/dL 19  17    Creatinine 0.61 - 1.24 mg/dL 0.95  1.14  1.36   Sodium 135 - 145 mmol/L 137  136    Potassium 3.5 - 5.1 mmol/L 3.1  3.3    Chloride 98 - 111 mmol/L 101  98    CO2 22 - 32 mmol/L 28  30    Calcium 8.9 - 10.3 mg/dL 9.0  9.0     Increase KCL to 62mq TID- recheck prior to discharge   8.  Hypothyroidism.  Synthroid 9.  Diabetes mellitus.  Hemoglobin A1c 8.3.  Currently on SSI.  Patient was on Levemir 39 units twice daily prior to admission as well as Farxiga 10 mg daily and Trulicity 3 mg every Thursday.  Resume as needed\ CBG (last 3)  Recent Labs    05/16/22 1619 05/16/22 2106 05/17/22 0555  GLUCAP 172* 207* 140*   Restart levimir, 15U qhs 2/20, increase to 25U on 2/21, increase to 35 U on 2/22, increased to 45 units on 2/24; 55 U 2/25,  Increase to 60U on 2/26 - am CBG on low side reduce to 57U Restart farxiga '5mg'$   tomorrow to address daytime cbgs, titrate up as  needed   10.  Hypertension.  Coreg 12.5 mg twice daily, HCTZ 25 mg daily.  Monitor with increased mobility Vitals:   05/17/22  0510 05/17/22 0814  BP: (!) 141/72 118/62  Pulse: (!) 55 (!) 55  Resp: 16   Temp: 97.8 F (36.6 C)   SpO2: 99%   Some lability will monitor prior to dosage change   - normotensive, monitor 2/29  11.  Hyperlipidemia.  Crestor 12.  Urinary incontinence  will try to wean off condom cath     LOS: 10 days A FACE TO FACE EVALUATION WAS PERFORMED  Charlett Blake 05/17/2022, 8:24 AM

## 2022-05-17 NOTE — Consult Note (Signed)
Neuropsychological Consultation Comprehensive Inpatient Rehab   Patient:   Joseph Ortega   DOB:   [birth year]  MR Number:  [id number]  Location:  [facility] [id number] [place] [address] Dept: [place]: [phone]           Date of Service:   05/16/2022  Start Time:   1 PM End Time:   2 PM  Provider/Observer:  Ilean Skill, Psy.D.       Clinical Neuropsychologist       Billing Code/Service: [phone]  Reason for Service:    Joseph Ortega is a 67 year old male referred for neuropsychological consultation due to coping and adjustment issues during his ongoing hospitalization in the comprehensive inpatient rehabilitation program.  The patient had a recent hemorrhagic infarct affecting the right posterior parietal region and has been referred to CIR due to decreased functional mobility following CVA.  Patient has a past medical history of hypertension, diabetes and previous CVA with left-sided residual weakness.  Patient CVA occurred in 2022.  Patient presented on 05/02/2022 with headache and nonspecific chest pain.  Cranial CT showed acute hemorrhagic infarct affecting the right posterior parietal lobe with regions of infarction measuring 7.6 x 3.6 x 4.1 cm.  There was also an old infarction of the right frontal parietal vertex and right parietal lobe.  MRI redemonstrated infarction with hemorrhage in the right temporoparietal region.  The patient was previously on the inpatient rehab unit after his December 2022 stroke and followed by Dr. Naaman Plummer.  I saw him 1 time during the 2020 04/2021 hospitalization around his first stroke.  Patient had left-sided motor deficits for his left arm and leg and even at that time describes some visual deficits in his left visual field.  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  During the clinical  visit today the patient reports that he is doing much better and has been benefiting and improving with therapy.  The patient acknowledges previous motor weakness as a residual from his 2022 CVA.  Consistent with region and location of his most recent CVA the patient describes visual disturbance and changes in visual spatial/visual processing that are continuing.  The patient noted that he had had previous eye surgery and had some impact on his vision but these changes are different for the current status.  Patient was oriented but somewhat impulsive during our interaction.  He was aware of his visual disturbance but unclear how much visual neglect versus simply visual processing issues are present.  He acknowledges almost day-to-day improvement in his status.  Patient denies significant anxiety or depression but he does have some intrusive thoughts and concerns about his recovery process as well as potential risk for further type of cerebrovascular events.  HPI for the current admission:    HPI: Joseph Ortega is a 67 year old right-handed male with history of hypertension as well as diabetes mellitus as well as 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  Per chart review patient lives with wife.  1 level home with ramped entrance.  Wife is reportedly limited mobility.  Presented 05/02/2022 with headache and nonspecific chest pain.  Cranial CT scan showed acute hemorrhagic infarct affecting the right posterior parietal lobe.  The region of infarction measuring 7.6 x 3.6 x 4.1 cm.  Old infarction of the right frontal parietal vertex and right occipital  lobe.  CT angiogram head and neck no large vessel occlusion.  MRI redemonstrated infarction with hemorrhage in the right temporoparietal region.  Patient did not receive tPA.  Echocardiogram with ejection fraction of 50 to [id number] grade 1 diastolic dysfunction.  Neurology follow-up patient was placed on Cleviprex for  blood pressure control.  Joseph Kitchen  His aspirin was initially held and has since been resumed.  He was later cleared to begin subcutaneous heparin for DVT prophylaxis 05/05/2022.  Latest cranial CT scan 05/06/2022 unchanged no frank hematoma seen.  Awaiting plan for loop recorder.  Tolerating a regular consistency diet.  Therapy evaluations completed due to patient decreased functional mobility was admitted for a comprehensive rehab program.   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 his dress was Appropriate and he was Well Groomed and his manners were Appropriate to the situation.  his participation was indicative of Appropriate and Redirectable behaviors.  There were physical disabilities noted.  he displayed an appropriate level of cooperation and motivation.    Interactions:    Active Appropriate  Attention:   abnormal and attention span appeared shorter than expected for age  Memory:   within normal limits; recent and remote memory intact  Visuo-spatial:   abnormal  Speech (Volume):  normal  Speech:   normal; normal  Thought  Process:  Coherent and Relevant  Coherent and Logical  Though Content:  WNL; not suicidal and not homicidal  Orientation:   person, place, time/date, and situation  Judgment:   Good  Planning:   Fair  Affect:    Appropriate  Mood:    Dysphoric  Insight:   Good  Intelligence:   normal  Psychiatric History:  Patient is taking Cymbalta for mood.  Patient has had a lot of stress with his reduction in functioning from earlier stroke event and how these cerebrovascular events have been impacting his capacity to care for his wife who is disabled.   Family Med/Psych History:  Family History  Problem Relation Age of Onset   Diabetes Father       Impression/DX:   Joseph Ortega is a 67 year old male referred for neuropsychological consultation due to coping and adjustment issues during his ongoing hospitalization in the comprehensive inpatient rehabilitation program.  The patient had a recent hemorrhagic infarct affecting the right posterior parietal region and has been referred to CIR due to decreased functional mobility following CVA.  Patient has a past medical history of hypertension, diabetes and previous CVA with left-sided residual weakness.  Patient CVA occurred in 2022.  Patient presented on 05/02/2022 with headache and nonspecific chest pain.  Cranial CT  showed acute hemorrhagic infarct affecting the right posterior parietal lobe with regions of infarction measuring 7.6 x 3.6 x 4.1 cm.  There was also an old infarction of the right frontal parietal vertex and right parietal lobe.  MRI redemonstrated infarction with hemorrhage in the right temporoparietal region.  During the clinical visit today the patient reports that he is doing much better and has been benefiting and improving with therapy.  The patient acknowledges previous motor weakness as a residual from his 2022 CVA.  Consistent with region and location of his most recent CVA the patient describes visual disturbance and changes in  visual spatial/visual processing that are continuing.  The patient noted that he had had previous eye surgery and had some impact on his vision but these changes are different for the current status.  Patient was oriented but somewhat impulsive during our interaction.  He was aware of his visual disturbance but unclear how much visual neglect versus simply visual processing issues are present.  He acknowledges almost day-to-day improvement in his status.  Patient denies significant anxiety or depression but he does have some intrusive thoughts and concerns about his recovery process as well as potential risk for further type of cerebrovascular events.  Disposition/Plan:  Today we worked on coping and adjustment issues with his most recent CVA as well as CVA just over 1 year prior.  Diagnosis:    Hemorrhagic infarction in the temporoparietal region with previous left frontal stroke.         Electronically Signed   _______________________ Ilean Skill, Psy.D. Clinical Neuropsychologist

## 2022-05-17 NOTE — Evaluation (Signed)
Recreational Therapy Assessment and Plan  Patient Details  Name: Joseph Ortega MRN: [id number] Date of Birth: 15-Aug-1955 Today's Date: 05/17/2022  Rehab Potential:  Good ELOS:   d/c 3/7  Assessment Hospital Problem: Principal Problem:   Right middle cerebral artery stroke [facility])     Past Medical History:      Past Medical History:  Diagnosis Date   Diabetes mellitus without complication ([place])     Hypertension     Thyroid disease      Past Surgical History:       Past Surgical History:  Procedure Laterality Date   HERNIA REPAIR       IR ANGIO INTRA EXTRACRAN SEL COM CAROTID INNOMINATE BILAT MOD SED   03/09/2021   IR ANGIO VERTEBRAL SEL VERTEBRAL BILAT MOD SED   03/09/2021   IR US GUIDE VASC ACCESS RIGHT   03/09/2021   LOOP RECORDER INSERTION N/A 05/07/2022    Procedure: LOOP RECORDER INSERTION;  Surgeon: Vickie Epley, MD;  Location: [facility];  Service: Cardiovascular;  Laterality: N/A;      Assessment & Plan Clinical Impression: Patient is a 67 y.o. right-handed male with history of hypertension as well as diabetes mellitus as well as 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  Per chart review patient lives with wife.  1 level home with ramped entrance.  Wife is reportedly limited mobility.  Presented 05/02/2022 with headache and nonspecific chest pain.  Cranial CT scan showed acute hemorrhagic infarct affecting the right posterior parietal lobe.  The region of infarction measuring 7.6 x 3.6 x 4.1 cm.  Old infarction of the right frontal parietal vertex and right occipital lobe.  CT angiogram head and neck no large vessel occlusion.  MRI redemonstrated infarction with hemorrhage in the right temporoparietal region.  Patient did not receive tPA.  Echocardiogram with ejection fraction of 50 to [id number] grade 1 diastolic dysfunction.  Neurology follow-up patient was placed on Cleviprex for blood pressure control.  Marland Kitchen  His  aspirin was initially held and has since been resumed.  He was later cleared to begin subcutaneous heparin for DVT prophylaxis 05/05/2022.  Latest cranial CT scan 05/06/2022 unchanged no frank hematoma seen.  Awaiting plan for loop recorder.  Tolerating a regular consistency diet.  Therapy evaluations completed due to patient decreased functional mobility was admitted for a comprehensive rehab program.  Pt presents with decreased activity tolerance, decreased functional mobility, decreased balance, decreased coordination,decreased vision, decreased memory, decreased problem solving Limiting pt's independence with leisure/community pursuits.   Plan  MIn 1 TR session for community reintegration >60 minutes  Recommendations for other services: Neuropsych  Discharge Criteria: Patient will be discharged from TR if patient refuses treatment 3 consecutive times without medical reason.  If treatment goals not met, if there is a change in medical status, if patient makes no progress towards goals or if patient is discharged from hospital.  The above assessment, treatment plan, treatment alternatives and goals were discussed and mutually agreed upon: by patient  [place] 05/17/2022, 12:53 PM

## 2022-05-18 LAB — GLUCOSE, CAPILLARY
Glucose-Capillary: 119 mg/dL — ABNORMAL HIGH (ref 70–99)
Glucose-Capillary: 148 mg/dL — ABNORMAL HIGH (ref 70–99)
Glucose-Capillary: 160 mg/dL — ABNORMAL HIGH (ref 70–99)
Glucose-Capillary: 201 mg/dL — ABNORMAL HIGH (ref 70–99)

## 2022-05-18 MED ORDER — HYDROCERIN EX CREA
TOPICAL_CREAM | Freq: Two times a day (BID) | CUTANEOUS | Status: DC
  Filled 2022-05-18: qty 113

## 2022-05-18 MED ORDER — DAPAGLIFLOZIN PROPANEDIOL 10 MG PO TABS
10.0000 mg | ORAL_TABLET | Freq: Every day | ORAL | Status: DC
  Filled 2022-05-18: qty 1

## 2022-05-18 NOTE — Progress Notes (Signed)
Recreational Therapy Session Note  Patient Details  Name: Joseph Ortega MRN: [id number] Date of Birth: [birth year] Today's Date: 05/18/2022  Pain: no c/o Skilled Therapeutic Interventions/Progress Updates: Met with pt to discuss the purpose of community reintegration/outing and potential goals.  Pt agreeable to participate in an outing to be scheduled Tuesday 3/5. [place] 05/18/2022, 11:27 AM

## 2022-05-18 NOTE — Progress Notes (Signed)
Patient ID: Joseph Ortega, male   DOB: 1955-07-20, 67 y.o.   MRN: [id number]  RW ordered through [facility].

## 2022-05-18 NOTE — Progress Notes (Signed)
PROGRESS NOTE   Subjective/Complaints:  No issues overnite   ROS- denies CP, SOB, N/V/D Objective:   No results found. No results for input(s): "WBC", "HGB", "HCT", "PLT" in the last 72 hours.  No results for input(s): "NA", "K", "CL", "CO2", "GLUCOSE", "BUN", "CREATININE", "CALCIUM" in the last 72 hours.   Intake/Output Summary (Last 24 hours) at 05/18/2022 0737 Last data filed at 05/18/2022 0700 Gross per 24 hour  Intake 1227 ml  Output 1730 ml  Net -503 ml         Physical Exam: Vital Signs Blood pressure (!) 144/76, pulse (!) 58, temperature 97.6 F (36.4 C), temperature source Oral, resp. rate 18, weight 81.9 kg, SpO2 100 %.   General: No acute distress Mood and affect are appropriate Heart: Regular rate and rhythm no rubs murmurs or extra sounds Lungs: Clear to auscultation, breathing unlabored, no rales or wheezes.  Abdomen: Positive bowel sounds, soft nontender to palpation, nondistended Extremities: No clubbing, cyanosis, or edema Skin: No evidence of breakdown, no evidence of rash Neurologic: Cranial nerves II through XII intact, motor strength is 5/5 in right and 3- left deltoid, bicep, tricep, grip, 5/5 right and 4-/5 left hip flexor, knee extensors,    Assessment/Plan: 1. Functional deficits which require 3+ hours per day of interdisciplinary therapy in a comprehensive inpatient rehab setting. Physiatrist is providing close team supervision and 24 hour management of active medical problems listed below. Physiatrist and rehab team continue to assess barriers to discharge/monitor patient progress toward functional and medical goals  Care Tool:  Bathing    Body parts bathed by patient: Left arm, Chest, Abdomen, Front perineal area, Right upper leg, Left upper leg, Face   Body parts bathed by helper: Right arm, Buttocks, Left lower leg, Right lower leg     Bathing assist Assist Level: Moderate  Assistance - Patient 50 - 74%     Upper Body Dressing/Undressing Upper body dressing   What is the patient wearing?: Pull over shirt    Upper body assist Assist Level: Moderate Assistance - Patient 50 - 74%    Lower Body Dressing/Undressing Lower body dressing      What is the patient wearing?: Underwear/pull up, Pants     Lower body assist Assist for lower body dressing: Maximal Assistance - Patient 25 - 49%     Toileting Toileting    Toileting assist Assist for toileting: Moderate Assistance - Patient 50 - 74%     Transfers Chair/bed transfer  Transfers assist     Chair/bed transfer assist level: Contact Guard/Touching assist Chair/bed transfer assistive device: Programmer, multimedia   Ambulation assist      Assist level: Contact Guard/Touching assist Assistive device: Walker-rolling Max distance: 159f   Walk 10 feet activity   Assist     Assist level: Contact Guard/Touching assist Assistive device: Walker-rolling   Walk 50 feet activity   Assist    Assist level: Contact Guard/Touching assist Assistive device: Walker-rolling    Walk 150 feet activity   Assist    Assist level: Contact Guard/Touching assist Assistive device: Walker-rolling    Walk 10 feet on uneven surface  activity   Assist Walk  10 feet on uneven surfaces activity did not occur: Safety/medical concerns         Wheelchair     Assist Is the patient using a wheelchair?: Yes Type of Wheelchair: Manual    Wheelchair assist level: Maximal Assistance - Patient 25 - 49% Max wheelchair distance: 60 ft    Wheelchair 50 feet with 2 turns activity    Assist        Assist Level: Maximal Assistance - Patient 25 - 49%   Wheelchair 150 feet activity     Assist      Assist Level: Total Assistance - Patient < 25%   Blood pressure (!) 144/76, pulse (!) 58, temperature 97.6 F (36.4 C), temperature source Oral, resp. rate 18, weight 81.9  kg, SpO2 100 %.  Medical Problem List and Plan: 1. 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 Had a prior Left hemiparesis but has more weakness since most recent CVA              -patient may  shower             -ELOS/Goals: 05/24/2022  Cont CIR PT, OT  2.  Antithrombotics: -DVT/anticoagulation:  Pharmaceutical: Heparin- switch to lovenox , no renal disease              -antiplatelet therapy: Aspirin 81 mg daily resumed on 05/07/2022 3. Pain Management: Topamax 50 mg daily for headache 4. Mood/Behavior/Sleep: Provide emotional support             -antipsychotic agents: N/A 5. Neuropsych/cognition: This patient is capable of making decisions on his own behalf. 6. Skin/Wound Care: Routine skin checks 7. Fluids/Electrolytes/Nutrition: Routine in and outs with follow-up chemistries hypoK+ on HCTZ start daily KCL,     Latest Ref Rng & Units 05/14/2022    7:14 AM 05/08/2022    5:36 AM 05/07/2022   10:23 PM  BMP  Glucose 70 - 99 mg/dL 188  244    BUN 8 - 23 mg/dL 19  17    Creatinine 0.61 - 1.24 mg/dL 0.95  1.14  1.36   Sodium 135 - 145 mmol/L 137  136    Potassium 3.5 - 5.1 mmol/L 3.1  3.3    Chloride 98 - 111 mmol/L 101  98    CO2 22 - 32 mmol/L 28  30    Calcium 8.9 - 10.3 mg/dL 9.0  9.0     Increase KCL to 18mq TID- recheck prior to discharge   8.  Hypothyroidism.  Synthroid 9.  Diabetes mellitus.  Hemoglobin A1c 8.3.  Currently on SSI.  Patient was on Levemir 39 units twice daily prior to admission as well as Farxiga 10 mg daily and Trulicity 3 mg every Thursday.  Resume as needed\ CBG (last 3)  Recent Labs    05/17/22 1656 05/17/22 2057 05/18/22 0549  GLUCAP 161* 192* 119*   Restart levimir, 15U qhs 2/20, increase to 25U on 2/21, increase to 35 U on 2/22, increased to 45 units on 2/24; 55 U 2/25,  Increase to 60U on 2/26 - am CBG on low side reduce to 57U Restart farxiga '5mg'$   tomorrow to address daytime cbgs,  titrate up as needed  3/1 am CBG in range will increase faxiga to '10mg'$    10.  Hypertension.  Coreg 12.5 mg twice daily, HCTZ 25 mg daily.  Monitor with increased mobility Vitals:   05/17/22 2005 05/18/22 0517  BP: (!) 154/77 (Marland Kitchen  144/76  Pulse: 60 (!) 58  Resp: 18 18  Temp: 98.4 F (36.9 C) 97.6 F (36.4 C)  SpO2: 99% 100%  Some lability will monitor prior to dosage change   - normotensive, monitor 2/29  11.  Hyperlipidemia.  Crestor 12.  Urinary incontinence  will try to wean off condom cath     LOS: 11 days A FACE TO [place] E Salbador Fiveash 05/18/2022, 7:37 AM

## 2022-05-18 NOTE — Progress Notes (Signed)
Physical Therapy Session Note  Patient Details  Name: Joseph Ortega MRN: [id number] Date of Birth: March 23, 1955  Today's Date: 05/18/2022 PT Individual Time: 1300-1335 PT Individual Time Calculation (min): 35 min   Short Term Goals: Week 2:  PT Short Term Goal 1 (Week 2): = to LTGs based on ELOS  Skilled Therapeutic Interventions/Progress Updates:    Session focused on functional gait training with RW, L hand orthosis and L AFO and NMR for higher level balance retraining and gait without AD. Pt able to perform with RW x 150' with CGA with focus on upright posture, LLE advancement and increasing step length. Without AD, engaged in [place] with Bilateral HHA for forward gait, retro gait, and R and L lateral sidestepping. Pt demonstrates decreased L stance time, decreased L foot clearance, cues for neutral hip during side stepping, and increasing step length bilaterally during retro gait. Blocked practice sit <> stands without AD with focus on anterior weigthshift, LLE activation, and transitional movements. Toe taps attempted to 4 in step without UE support with poor clearance on L and unable to achieve with R. Added UE support with cues for light support for balance to perform alternating and coordinated movements. End of session transferred back to w/c with RW with CGA for balance.   Therapy Documentation Precautions:  Precautions Precautions: Fall Precaution Comments: L sided weakness; BP <150 Restrictions Weight Bearing Restrictions: No Other Position/Activity Restrictions: Needs Loop Monitor cardiac device wiht him in therapy  Pain: Reports intermittent low back pain - premedicated. Breaks given as needed.    Therapy/Group: Individual Therapy  Canary Brim Ivory Broad, PT, DPT, CBIS  05/18/2022, 1:40 PM

## 2022-05-18 NOTE — Progress Notes (Signed)
Occupational Therapy Session Note  Patient Details  Name: Joseph Ortega MRN: [id number] Date of Birth: May 17, 1955  Today's Date: 05/18/2022 OT Individual Time: 1105-1210 OT Individual Time Calculation (min): 65 min    Short Term Goals: Week 1:  OT Short Term Goal 1 (Week 1): Complete UB self care with S using hemi techniques OT Short Term Goal 1 - Progress (Week 1): Met OT Short Term Goal 2 (Week 1): Complete LB self care using hemi techniques with CGA OT Short Term Goal 2 - Progress (Week 1): Met OT Short Term Goal 3 (Week 1): Amb for toilet transfer with RW with CGA OT Short Term Goal 3 - Progress (Week 1): Met OT Short Term Goal 4 (Week 1): Pt will stand for oral care or kitchen sink task for up to 5 min with CGA OT Short Term Goal 4 - Progress (Week 1): Met  Skilled Therapeutic Interventions/Progress Updates:    Pt received in bed with 4 out of 10 pain in back. yoga provided for pain relief  Therapeutic activity Focus of session on LUE NMR and yoga for back pain management All functional transfers with MIN A and RW with L hand splint. No cuing needed for safety or RW management.  Seated EOM with saebo MAS pt completes functional reaching task with LUE with tactile + visual cues with mirror to decrease shoulder compensations in cross body motion. Worked on shoulder flex/ext & internal/external rotation with assistance of MAS.  Completed EOB and supine yoga poses: figure 4, seated pigeon with block, cat/cow, seated twist, side bend, supine knees to chest, piriformis stretch, and supine twist. All ocmpleted with demo/instrucitonal cuing as well as education on breathing to improve deepening of poses  Pt left at end of session in bed with exit alarm on, call light in reach and all needs met   Therapy Documentation Precautions:  Precautions Precautions: Fall Precaution Comments: L sided weakness; BP <150 Restrictions Weight Bearing Restrictions: No Other Position/Activity  Restrictions: Needs Loop Monitor cardiac device wiht him in therapy General:    Therapy/Group: Individual Therapy  Tonny Branch 05/18/2022, 6:57 AM

## 2022-05-18 NOTE — Progress Notes (Signed)
Speech Language Pathology Daily Session Note  Patient Details  Name: Joseph Ortega MRN: [id number] Date of Birth: February 11, 1956  Today's Date: 05/18/2022 SLP Individual Time: 1025-1100 SLP Individual Time Calculation (min): 35 min  Short Term Goals: Week 2: SLP Short Term Goal 1 (Week 2): Patient will recall and use memory compensations with sup A verbal cues for effectiveness SLP Short Term Goal 2 (Week 2): Patient will complete complex problem solving with iADL tasks including medication and/or money management with sup A verbal cues SLP Short Term Goal 3 (Week 2): Patient will complete scheduling and/or organization tasks with sup A verbal cues  Skilled Therapeutic Interventions: Pt seen this date for skilled ST intervention targeting cognitive goals outlined above. Pt received awake/alert and attempting to sit EOB; speaking with TR. Agreeable to intervention at bedside. Pleasant and participatory throughout.   Today's session with emphasis on cognitive skill training within the context of functional iADL related tasks. Pt participated in active discussion re: current medications - asked questions re: what a Beta Blocker was given he recently found out that his Coreg is considered  Beta Blocker. Pt wishing to re-visit pill organizer to build confidence and fluency with this task given pt is becoming more automonous with his knowledge, recall, and understanding of current medication regimen.   Pt completed BID pill organizer task with 100% accuracy given intermittent Min A - pt filled box independently; however, required assistance double checking for errors and correcting (double dosed a pill x 2 and missed a box x 2). Pt unaware of errors until pointed out to him. Maintained attention to task with Sup A despite mildly distracting environment. Once SLP pointed out errors and discussed ways to problem-solving these errors, pt more deliberate and accurately placed remaining medications and double  checked for accuracy. Recommend pt fill pill organizer at d/c, only if he receives supervision for accuracy and double checking and keeping medication bottles organized. Of note, pt missed 10 minutes of skilled ST intervention due to scheduling conflict (TR speaking with pt re: upcoming outing).  Pt left in room and sitting EOB with all safety measures activated, call bell within reach, and all immediate needs met. Continue per current ST POC.  Pain No pain reported; NAD  Therapy/Group: Individual Therapy  Tory Septer A Arrington Bencomo 05/18/2022, 12:57 PM

## 2022-05-18 NOTE — Progress Notes (Addendum)
Occupational Therapy Session Note  Patient Details  Name: Ahmon Gair MRN: [id number] Date of Birth: 1956/01/14  Today's Date: 05/18/2022 OT Individual Time: [id number] OT Individual Time Calculation (min): 44 min  OT Individual Time: [id number] OT Individual Time Calculation (min): 43 min   Short Term Goals: Week 2:  OT Short Term Goal 1 (Week 2): STG=LTG d/t Pt ELOS  Skilled Therapeutic Interventions/Progress Updates:     AM Session: Pt received relined in bed presenting to be in good spirits and receptive to skilled OT session. Pt reporting mild pain in back with OT offering repositioning and rest breaks, Pt receiving pain medications prior to session. Pt transitioned to EOB supervision using bed features with increased time provided to prevent onset of back pain. Shoes donned total A with Pt sitting EOB for time management. Pt urgently requesting to use restroom. Sit>stand using RW supervision with min cues for motivation and technique. Pt ambulated to bathroom using RW supervision with cueing provided for step length/height and breathing with movement. Pt transitioned to elevated toilet seat supervision. Provided pt increased time on toilet d/t need for BM (+B&B charted in flowsheets). Pt able to complete 3/3 toileting tasks with supervision using grab bars and RW. Ambulated out of bathroom using RW with Pt reporting increased pain in low back with back spasm present. Guided Pt through deep breathing with improvement noted. Pt completed gooming/hygiene tasks in standing at sink for dynamic stnading balance challenge with OT facilitating pt completing self guided HOH A to LUE when washing face to increase LUE use for BADLs. Pt continuing to present with back pain. OT guided Pt through completing gentle torso and neck stretches to decrease pain and increase muscle activation with emphasis on using breath to guide movements. Pt completed the following exercises seated EOB -cat/cow chest and back  openers -trunk rotation -neck rolls -shoulder rolls Pt returned to bed CGA. Pt was left resting in bed with call bell in reach, seat belt alarm on, and all needs met.   PM Session: Pt received sitting up in wc presenting to be in good spirits and receptive to skilled OT session. Pt in good spirits and receptive to skilled OT session with  focus on self-care tasks, pain management, and bed positioning.   Spoke with PT prior to session to discuss plan for pt AFO with PT reporting plan for custom AFO, plastic to ensure proper fit and increased independence in Pt donning AFO. Facilitated opportunities for Pt practicing donning shoe with plastic AFO trial to problem solve positioning and need for AD to increase independence. Following multiple attempts and practice, Pt able to don shoe and AFO independently with min cueing for technique. Pt motivated and agreeable to using AFO at d/c.   Pt reporting increase in back this session, participated in discussion with OT on possible causes and collaborating to problem solve that soft hospital mattress and bed positioning may be adding additional back pain. Provided pt handout on bed positioning following CVA and educated on benefit of increased proprioceptive input to affected extremities when sleeping in side lying on affected side. Educated Pt on log rolling in/out of bed with demonstration and practice provided with Pt completing with supervision. Instructed Pt to trial sleeping on side with Pt able to roll with mod cueing for technique and place pillows between knees and hold pillow at chest to increase shoulder protraction. Pt reporting decreased back pain in this position. Pt was left resting in bed with call bell in reach, bd alarm  on, and all needs met.   Therapy Documentation Precautions:  Precautions Precautions: Fall Precaution Comments: L sided weakness; BP <150 Restrictions Weight Bearing Restrictions: No Other Position/Activity Restrictions:  Needs Loop Monitor cardiac device wiht him in therapy General:   Vital Signs: Therapy Vitals Temp: 97.6 F (36.4 C) Temp Source: Oral Pulse Rate: (Abnormal) 58 Resp: 18 BP: (Abnormal) 144/76 Patient Position (if appropriate): Lying Oxygen Therapy SpO2: 100 % O2 Device: Room Air Pain: Pain Assessment Pain Scale: 0-10 Pain Score: 6  Pain Type: Acute pain Pain Location: Back Pain Orientation: Posterior;Lower Pain Descriptors / Indicators: Aching Pain Frequency: Constant Pain Onset: On-going Pain Intervention(s): Medication (See eMAR) ADL: ADL Equipment Provided: Long-handled sponge Eating: Set up Where Assessed-Eating: Bed level Grooming: Moderate assistance Where Assessed-Grooming: Sitting at sink Upper Body Bathing: Moderate assistance Where Assessed-Upper Body Bathing: Sitting at sink Lower Body Bathing: Maximal assistance Where Assessed-Lower Body Bathing: Sitting at sink, Standing at sink Upper Body Dressing: Moderate assistance Where Assessed-Upper Body Dressing: Sitting at sink Lower Body Dressing: Maximal assistance Where Assessed-Lower Body Dressing: Sitting at sink, Standing at sink Toileting: Minimal assistance Where Assessed-Toileting: Glass blower/designer: Moderate assistance Toilet Transfer Method: Counselling psychologist: Bedside commode, Energy manager: Maximal Firefighter Method: Radiographer, therapeutic: Radio broadcast assistant ADL Comments: min A amb with RW with limited L grasp- now with L walker splint, min A UB self care, mod a LB self care pull on clothing   Therapy/Group: Individual Therapy  Janey Genta 05/18/2022, 8:20 AM

## 2022-05-19 LAB — GLUCOSE, CAPILLARY
Glucose-Capillary: 60 mg/dL — ABNORMAL LOW (ref 70–99)
Glucose-Capillary: 76 mg/dL (ref 70–99)
Glucose-Capillary: 142 mg/dL — ABNORMAL HIGH (ref 70–99)
Glucose-Capillary: 156 mg/dL — ABNORMAL HIGH (ref 70–99)
Glucose-Capillary: 239 mg/dL — ABNORMAL HIGH (ref 70–99)

## 2022-05-19 MED ORDER — DAPAGLIFLOZIN PROPANEDIOL 10 MG PO TABS: 10.0000 mg | ORAL_TABLET | Freq: Every day | ORAL | Status: DC

## 2022-05-19 MED ORDER — INSULIN DETEMIR 100 UNIT/ML ~~LOC~~ SOLN
50.0000 [IU] | Freq: Every day | SUBCUTANEOUS | Status: DC
  Administered 2022-05-19 – 2022-05-27 (×9): 50 [IU] via SUBCUTANEOUS
  Filled 2022-05-19 (×10): qty 0.5

## 2022-05-19 MED ORDER — DAPAGLIFLOZIN PROPANEDIOL 5 MG PO TABS
5.0000 mg | ORAL_TABLET | Freq: Every day | ORAL | Status: DC
  Administered 2022-05-19 – 2022-05-20 (×2): 5 mg via ORAL
  Filled 2022-05-19 (×2): qty 1

## 2022-05-19 NOTE — Progress Notes (Signed)
Physical Therapy Session Note  Patient Details  Name: Joseph Ortega MRN: [id number] Date of Birth: 03-Nov-1955  Today's Date: 05/19/2022 PT Individual Time: 1400-1445 PT Individual Time Calculation (min): 45 min   Short Term Goals: Week 2:  PT Short Term Goal 1 (Week 2): = to LTGs based on ELOS  Skilled Therapeutic Interventions/Progress Updates:  Patient greeted supine in bed and agreeable to PT treatment session. Patient transitioned from supine to sitting EOB with use of bed rail and supv. While sitting EOB, therapist donned shoes and L AFO. Patient stood from EOB with RW and SBA- Patient gait trained >150' form his room to main rehab gym with RW and SBA/Supv for safety. Minor VC for improved stance time on L LE for improved step length on R in order to reduce shuffling. Once in the rehab gym, therapist donned dycem to RW under L hand splint in order to reduce movement and improve overall stability with good improvements noted.   Patient gait trained >120' with B HHA and MinA for improved overall stability- Therapist facilitating improved L lateral weight shift for improved step length with R LE and providing approximation at L hip for improved stability during stance phase of gait. Patient noted to have increased fear throughout gait trial without RW support and required reassurance with significant improvements noted with increased repetition.   Patient tasked with standing from mat table with R foot on 4" block in order to facilitate forced use of L LE throughout stand/sit- Therapist providing MinA with multimodal cues for improved L lateral weight shift and glute activation. VC for pursed lip breathing as patient was noted to hold his breath during activity. Patient performed x5 with extended seated rest break required afterward.   Patient returned to his room via wheelchair and transferred back to sitting EOB with RW and SBA. Patient left sitting EOB with call bell within reach and all needs  met- NT notified and aware of patient positioning.    Therapy Documentation Precautions:  Precautions Precautions: Fall Precaution Comments: L sided weakness; BP <150 Restrictions Weight Bearing Restrictions: No Other Position/Activity Restrictions: Needs Loop Monitor cardiac device wiht him in therapy  Therapy/Group: Individual Therapy  Casara Perrier 05/19/2022, 7:50 AM

## 2022-05-19 NOTE — Progress Notes (Signed)
Occupational Therapy Session Note  Patient Details  Name: Joseph Ortega MRN: [id number] Date of Birth: 1955/05/01  Today's Date: 05/19/2022 OT Individual Time: 1022-1104 OT Individual Time Calculation (min): 42 min    Short Term Goals: Week 1:  OT Short Term Goal 1 (Week 2): STG=LTG d/t Pt ELOS  Skilled Therapeutic Interventions/Progress Updates:    Patient agreeable to participate in OT session. Reports 0/10 pain level.   Patient participated in skilled OT session focusing on NM re-education, self-care, and therapeutic activities. . Therapist facilitated LUE muscle activation utilizing muscle vibration, and AA/ROM movement in order to facilitate greater muscle movement and ability to utilize LUE to complete functional ADL tasks. Seated, LUE, AA/ROM, shoulder protraction/retraction, elbow flexion/extension, 10X. Forearm placed on pillowcase on tabletop. Pt completed AA/ROM once again with provided targets on tabletop. Placement of targets adjusted to facilitate elbow extension, IR/er, and horizontal abduction/adduction. Improved A/ROM noted during task.  Pt completed all functional transfers from bed<>w/c with Min assist and RW. More assist required to power up. Pt donned both socks with SBA. Able to don his right shoe without assistance although required assist to don left shoe with AFO insert. Pt completed functional mobility using RW from his room to day room with Min guard assist.     Therapy Documentation Precautions:  Precautions Precautions: Fall Precaution Comments: L sided weakness; BP <150 Restrictions Weight Bearing Restrictions: No Other Position/Activity Restrictions: Needs Loop Monitor cardiac device wiht him in therapy   Therapy/Group: Individual Therapy  Ailene Ravel, OTR/L,CBIS  Supplemental OT - [facility] and [facility] Secure Chat Preferred   05/19/2022, 7:54 AM

## 2022-05-19 NOTE — Progress Notes (Signed)
Slept good. Assisted patient with CPAP machine. Declined scheduled Senna S. LBM 03/01. Pitting edema to BLE's. Bilateral heels elevated off bed with pillows. Joseph Ortega A

## 2022-05-19 NOTE — Significant Event (Signed)
Hypoglycemic Event  CBG: 60  Treatment: 4 oz juice/soda  Symptoms: None  Follow-up CBG: [id number] CBG Result:76  Possible Reasons for Event: unknown  Comments/MD notified:treated per protocol.    Joseph Ortega A

## 2022-05-20 LAB — GLUCOSE, CAPILLARY
Glucose-Capillary: 133 mg/dL — ABNORMAL HIGH (ref 70–99)
Glucose-Capillary: 150 mg/dL — ABNORMAL HIGH (ref 70–99)
Glucose-Capillary: 191 mg/dL — ABNORMAL HIGH (ref 70–99)
Glucose-Capillary: 233 mg/dL — ABNORMAL HIGH (ref 70–99)

## 2022-05-20 MED ORDER — DAPAGLIFLOZIN PROPANEDIOL 10 MG PO TABS
10.0000 mg | ORAL_TABLET | Freq: Every day | ORAL | Status: DC
  Administered 2022-05-21 – 2022-05-29 (×9): 10 mg via ORAL
  Filled 2022-05-20 (×9): qty 1

## 2022-05-20 NOTE — Progress Notes (Signed)
Occupational Therapy Session Note  Patient Details  Name: Joseph Ortega MRN: [id number] Date of Birth: 1955/04/11  Today's Date: 05/20/2022 OT Individual Time: 0915-1010 OT Individual Time Calculation (min): 55 min    Skilled Therapeutic Interventions/Progress Updates: Patient received resting in bed. Very motivated to participate with OT treatment. Patient wanting to focus on neuro reeducation of the LUE. Ambulated to therapy gym utilizing AFO and walker with hemi splint, CGA. Once seated on EOM worked on mobilization of the [facility] joint to allow for full PROM of the LUE and carpal and wrist mobilizations in preparation for wt. Bearing activities. Started with sitting EOB, using MAS for assisted reach to shoulder height working on knee to shoulder height reach and pnf patterns d1-2 with MAS assist set to 3. Patient very motivated by active use and reports no discomfort with reach. Therapist provided moderate assist to maintain proper reach pattern to prevent impingement at the [facility] joint with reaching above 90 degrees.  Continued re ed with pre reach wt bearing activity standing at wall using door frame to keep hand in line with his shoulder and block over compensation by anterior delt and pectoralis. Moderate triceps assist and moderate assist at radial aspect of the wrist for "push/ pre-reach" wt bearing position for triceps activation. Isometric holds for 5-10 seconds initially with mod assist then therapist able to back of to min assist at wrist only with continued wt bearing contact. Followed with wall slides grading from held prereach to active shoulder flexion with distal support on the wall. Patient educated on ways to perform this task seated in the w/c in his room to improve active use time of the LUE. Patient with good participation and tolerance of all activities. Continue with skilled OT POC to improve neurological gains for improved self care/functional use of the LUE.     Therapy  Documentation Precautions:  Precautions Precautions: Fall Precaution Comments: L sided weakness; BP <150 Restrictions Weight Bearing Restrictions: No Other Position/Activity Restrictions: Needs Loop Monitor cardiac device wiht him in therapy Pain:no c/o     Therapy/Group: Individual Therapy  Hermina Barters 05/20/2022, 12:30 PM

## 2022-05-20 NOTE — Progress Notes (Signed)
PROGRESS NOTE   Subjective/Complaints:  Comleted therapy this am , no further therapy this pm.  No c/os  ROS- denies CP, SOB, N/V/D Objective:   No results found. No results for input(s): "WBC", "HGB", "HCT", "PLT" in the last 72 hours.  No results for input(s): "NA", "K", "CL", "CO2", "GLUCOSE", "BUN", "CREATININE", "CALCIUM" in the last 72 hours.   Intake/Output Summary (Last 24 hours) at 05/20/2022 1122 Last data filed at 05/20/2022 1114 Gross per 24 hour  Intake 1312 ml  Output 2425 ml  Net -1113 ml         Physical Exam: Vital Signs Blood pressure 133/64, pulse 63, temperature 97.7 F (36.5 C), temperature source Oral, resp. rate 16, weight 81.9 kg, SpO2 94 %.   General: No acute distress Mood and affect are appropriate Heart: Regular rate and rhythm no rubs murmurs or extra sounds Lungs: Clear to auscultation, breathing unlabored, no rales or wheezes.  Abdomen: Positive bowel sounds, soft nontender to palpation, nondistended Extremities: No clubbing, cyanosis, or edema Skin: No evidence of breakdown, no evidence of rash Neurologic: Cranial nerves II through XII intact, motor strength is 5/5 in right and 3- left deltoid, bicep, tricep, grip, 5/5 right and 4-/5 left hip flexor, knee extensors,    Assessment/Plan: 1. Functional deficits which require 3+ hours per day of interdisciplinary therapy in a comprehensive inpatient rehab setting. Physiatrist is providing close team supervision and 24 hour management of active medical problems listed below. Physiatrist and rehab team continue to assess barriers to discharge/monitor patient progress toward functional and medical goals  Care Tool:  Bathing    Body parts bathed by patient: Left arm, Chest, Abdomen, Front perineal area, Right upper leg, Left upper leg, Face   Body parts bathed by helper: Right arm, Buttocks, Left lower leg, Right lower leg     Bathing  assist Assist Level: Moderate Assistance - Patient 50 - 74%     Upper Body Dressing/Undressing Upper body dressing   What is the patient wearing?: Pull over shirt    Upper body assist Assist Level: Moderate Assistance - Patient 50 - 74%    Lower Body Dressing/Undressing Lower body dressing      What is the patient wearing?: Underwear/pull up, Pants     Lower body assist Assist for lower body dressing: Maximal Assistance - Patient 25 - 49%     Toileting Toileting    Toileting assist Assist for toileting: Moderate Assistance - Patient 50 - 74%     Transfers Chair/bed transfer  Transfers assist     Chair/bed transfer assist level: Contact Guard/Touching assist Chair/bed transfer assistive device: Walker, Clinical biochemist   Ambulation assist      Assist level: Contact Guard/Touching assist Assistive device: Walker-rolling Max distance: 138f   Walk 10 feet activity   Assist     Assist level: Contact Guard/Touching assist Assistive device: Walker-rolling, Orthosis   Walk 50 feet activity   Assist    Assist level: Contact Guard/Touching assist Assistive device: Walker-rolling, Orthosis    Walk 150 feet activity   Assist    Assist level: Contact Guard/Touching assist Assistive device: Walker-rolling, Orthosis    Walk  10 feet on uneven surface  activity   Assist Walk 10 feet on uneven surfaces activity did not occur: Safety/medical concerns         Wheelchair     Assist Is the patient using a wheelchair?: Yes Type of Wheelchair: Manual    Wheelchair assist level: Maximal Assistance - Patient 25 - 49% Max wheelchair distance: 60 ft    Wheelchair 50 feet with 2 turns activity    Assist        Assist Level: Maximal Assistance - Patient 25 - 49%   Wheelchair 150 feet activity     Assist      Assist Level: Total Assistance - Patient < 25%   Blood pressure 133/64, pulse 63, temperature 97.7 F  (36.5 C), temperature source Oral, resp. rate 16, weight 81.9 kg, SpO2 94 %.  Medical Problem List and Plan: 1. 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 Had a prior Left hemiparesis but has more weakness since most recent CVA              -patient may  shower             -ELOS/Goals: 05/24/2022  Cont CIR PT, OT  2.  Antithrombotics: -DVT/anticoagulation:  Pharmaceutical: Heparin- switch to lovenox , no renal disease              -antiplatelet therapy: Aspirin 81 mg daily resumed on 05/07/2022 3. Pain Management: Topamax 50 mg daily for headache 4. Mood/Behavior/Sleep: Provide emotional support             -antipsychotic agents: N/A 5. Neuropsych/cognition: This patient is capable of making decisions on his own behalf. 6. Skin/Wound Care: Routine skin checks 7. Fluids/Electrolytes/Nutrition: Routine in and outs with follow-up chemistries hypoK+ on HCTZ start daily KCL,     Latest Ref Rng & Units 05/14/2022    7:14 AM 05/08/2022    5:36 AM 05/07/2022   10:23 PM  BMP  Glucose 70 - 99 mg/dL 188  244    BUN 8 - 23 mg/dL 19  17    Creatinine 0.61 - 1.24 mg/dL 0.95  1.14  1.36   Sodium 135 - 145 mmol/L 137  136    Potassium 3.5 - 5.1 mmol/L 3.1  3.3    Chloride 98 - 111 mmol/L 101  98    CO2 22 - 32 mmol/L 28  30    Calcium 8.9 - 10.3 mg/dL 9.0  9.0     Increase KCL to 64mq TID- recheck in am   8.  Hypothyroidism.  Synthroid 9.  Diabetes mellitus.  Hemoglobin A1c 8.3.  Currently on SSI.  Patient was on Levemir 39 units twice daily prior to admission as well as Farxiga 10 mg daily and Trulicity 3 mg every Thursday.  Resume as needed\ CBG (last 3)  Recent Labs    05/19/22 1620 05/19/22 2020 05/20/22 0625  GLUCAP 156* 239* 133*   Restart levimir, 15U qhs 2/20, increase to 25U on 2/21, increase to 35 U on 2/22, increased to 45 units on 2/24; 55 U 2/25,  Increase to 60U on 2/26 - am CBG on low side reduce to  57U Restart farxiga '5mg'$   tomorrow to address daytime cbgs, titrate up as needed  3/1 am CBG in range will increase faxiga to '10mg'$    10.  Hypertension.  Coreg 12.5 mg twice daily, HCTZ 25 mg daily.  Monitor with increased mobility Vitals:   05/19/22  1821 05/19/22 2012  BP:  133/64  Pulse: 64 63  Resp:  16  Temp:  97.7 F (36.5 C)  SpO2:  94%  Some lability will monitor prior to dosage change   - normotensive, monitor 3/3  11.  Hyperlipidemia.  Crestor 12.  Urinary incontinence  will try to wean off condom cath     LOS: 13 days A FACE TO FACE EVALUATION WAS PERFORMED  Charlett Blake 05/20/2022, 11:22 AM

## 2022-05-21 DIAGNOSIS — I10 Essential (primary) hypertension: Secondary | ICD-10-CM

## 2022-05-21 DIAGNOSIS — E876 Hypokalemia: Secondary | ICD-10-CM

## 2022-05-21 DIAGNOSIS — E119 Type 2 diabetes mellitus without complications: Secondary | ICD-10-CM

## 2022-05-21 LAB — BASIC METABOLIC PANEL
Anion gap: 11 (ref 5–15)
BUN: 25 mg/dL — ABNORMAL HIGH (ref 8–23)
CO2: 23 mmol/L (ref 22–32)
Calcium: 8.9 mg/dL (ref 8.9–10.3)
Chloride: 105 mmol/L (ref 98–111)
Creatinine, Ser: 1.09 mg/dL (ref 0.61–1.24)
GFR, Estimated: >60 mL/min (ref >60)
Glucose, Bld: 93 mg/dL (ref 70–99)
Potassium: 3.2 mmol/L — ABNORMAL LOW (ref 3.5–5.1)
Sodium: 139 mmol/L (ref 135–145)

## 2022-05-21 LAB — GLUCOSE, CAPILLARY
Glucose-Capillary: 112 mg/dL — ABNORMAL HIGH (ref 70–99)
Glucose-Capillary: 164 mg/dL — ABNORMAL HIGH (ref 70–99)
Glucose-Capillary: 157 mg/dL — ABNORMAL HIGH (ref 70–99)
Glucose-Capillary: 151 mg/dL — ABNORMAL HIGH (ref 70–99)

## 2022-05-21 MED ORDER — POTASSIUM CHLORIDE CRYS ER 20 MEQ PO TBCR
20.0000 meq | EXTENDED_RELEASE_TABLET | Freq: Two times a day (BID) | ORAL | Status: DC
  Administered 2022-05-21 – 2022-05-29 (×16): 20 meq via ORAL
  Filled 2022-05-21 (×16): qty 1

## 2022-05-21 MED ORDER — POTASSIUM CHLORIDE CRYS ER 20 MEQ PO TBCR
20.0000 meq | EXTENDED_RELEASE_TABLET | Freq: Once | ORAL | Status: AC
  Administered 2022-05-21: 20 meq via ORAL
  Filled 2022-05-21: qty 1

## 2022-05-21 NOTE — Progress Notes (Signed)
Occupational Therapy Session Note  Patient Details  Name: Joseph Ortega MRN: [id number] Date of Birth: 1955/12/11  Today's Date: 05/21/2022 OT Individual Time: 1300-1345 OT Individual Time Calculation (min): 45 min    Short Term Goals: Week 2:  OT Short Term Goal 1 (Week 2): STG=LTG d/t Pt ELOS  Skilled Therapeutic Interventions/Progress Updates:     Pt received sitting up in wc presenting to be in good spirits and receptive to skilled OT session. Pt reporting 0/10 pain, although mild tightness in back with Pt reporting no need for pain medications at this time, OT offering gentle stretching and repositioning. Sit>stand using RW supervision. Pt ambulated to therapy gym using RW and hand splint on L side with close supervision with OT providing VB cues for head/trunk positioning and step length for endurance and functional mobility training. Pt transferred to mat CGA with rest break provided. Pt completed series of functional reaching activities reaching anteriorly, superiorly, and cross body to increase functional use of LUE with mobile arm support to facilitate learning proper movement patterns. OT facilitating Pt utilizing up and out movement pattern vs. accommodating for decreased shoulder strength and decreased motor planning strategies with shoulder elevation. Pt noted to hold breath during tasks requiring mod cues to breath with movements. Graded task up to incorporate reaching and transporting items to facilitate grasp and release with Pt able to reach across his body to retrieve bean bag on his R side and place it in basket at eye level x8 trials. Graded up task to complete without mobile arm support with Pt able to complete x3 trials with intermittent hand under hand assistance provided and mod cues to facilitate external rotation. Pt completed stand step using RW back to wc with CGA d/t fatigue. Transported back to room total A in wc. Pt was left resting in wc with call bell in reach, seat belt  alarm on, and all needs met.    Therapy Documentation Precautions:  Precautions Precautions: Fall Precaution Comments: L sided weakness; BP <150 Restrictions Weight Bearing Restrictions: No Other Position/Activity Restrictions: Needs Loop Monitor cardiac device wiht him in therapy General:   Vital Signs: Therapy Vitals Temp: 98.2 F (36.8 C) Pulse Rate: (Abnormal) 51 Resp: (Abnormal) 24 BP: 118/67 Patient Position (if appropriate): Sitting Oxygen Therapy SpO2: 98 % O2 Device: Room Air  Therapy/Group: Individual Therapy  Janey Genta 05/21/2022, 1:39 PM

## 2022-05-21 NOTE — Progress Notes (Signed)
Physical Therapy Session Note  Patient Details  Name: Joseph Ortega MRN: [id number] Date of Birth: 02-04-1956  Today's Date: 05/21/2022 PT Individual Time: 1105-1200 PT Individual Time Calculation (min): 55 min   Short Term Goals: Week 2:  PT Short Term Goal 1 (Week 2): = to LTGs based on ELOS  Skilled Therapeutic Interventions/Progress Updates:      Pt sitting EOB to start with NT present for routine blood sugar testing - WNL. Pt in agreement to therapy session - denies pain. Has his L AFO on but is still waiting for his personal AFO to be delivered.   Stand<>pivot transfer with no AD and CGA from EOB to w/c. Cues for sequencing stepping on L to accept weight shifting during transfer. Transported in w/c to day room rehab gym to focus remainder of session on gait training using LiteGait over treadmill.  Harness donned/doffed in standing with UE support on LiteGait. Minimal body weight supported applied through harness. minA for safely stepping up/down from treadmill. Completed the following gait trials with CGA with BUE support from LiteGait:  1) 8 minutes, 315f, 0.570m 2) 8 minutes, [id number] rest break b/w trials but no standing rest break while ambulating. Primary gait deficits include decreased L hip/knee flexion in swing, decreased L heel strike on initial contact. No genu recurvatum while fatigued or knee buckling observed.  Cues for elongating stride, correcting gait deficits listed, and improving upright erect posture.   Pt returned to his room at conclusion of session - remained seated in w/c with all needs met, call bell within reach.    Therapy Documentation Precautions:  Precautions Precautions: Fall Precaution Comments: L sided weakness; BP <150 Restrictions Weight Bearing Restrictions: No Other Position/Activity Restrictions: Needs Loop Monitor cardiac device wiht him in therapy General:    Therapy/Group: Individual Therapy  Romain Erion P  Faust Thorington 05/21/2022, 7:59 AM

## 2022-05-21 NOTE — Progress Notes (Signed)
PROGRESS NOTE   Subjective/Complaints:  Pt up at EOB. Happy with rehab progress. Left AFO helping him. No complaints today  ROS: Patient denies fever, rash, sore throat, blurred vision, dizziness, nausea, vomiting, diarrhea, cough, shortness of breath or chest pain, joint or back/neck pain, headache, or mood change.    Objective:   No results found. No results for input(s): "WBC", "HGB", "HCT", "PLT" in the last 72 hours.  Recent Labs    05/21/22 0551  NA 139  K 3.2*  CL 105  CO2 23  GLUCOSE 93  BUN 25*  CREATININE 1.09  CALCIUM 8.9    Intake/Output Summary (Last 24 hours) at 05/21/2022 1308 Last data filed at 05/21/2022 1235 Gross per 24 hour  Intake 1428 ml  Output 1750 ml  Net -322 ml        Physical Exam: Vital Signs Blood pressure 118/67, pulse (!) 51, temperature 98.2 F (36.8 C), resp. rate (!) 24, weight 81.9 kg, SpO2 98 %.   Constitutional: No distress . Vital signs reviewed. HEENT: NCAT, EOMI, oral membranes moist Neck: supple Cardiovascular: RRR without murmur. No JVD    Respiratory/Chest: CTA Bilaterally without wheezes or rales. Normal effort    GI/Abdomen: BS +, non-tender, non-distended Ext: no clubbing, cyanosis, or edema Psych: pleasant and cooperative  Skin: No evidence of breakdown, no evidence of rash Neurologic: Cranial nerves II through XII intact, motor strength is 5/5 in right and 2+ to 3- left deltoid, bicep, tricep, grip, 5/5 right and 4-/5 left hip flexor, knee extensors, 1-1+ ankle.   Assessment/Plan: 1. Functional deficits which require 3+ hours per day of interdisciplinary therapy in a comprehensive inpatient rehab setting. Physiatrist is providing close team supervision and 24 hour management of active medical problems listed below. Physiatrist and rehab team continue to assess barriers to discharge/monitor patient progress toward functional and medical goals  Care  Tool:  Bathing    Body parts bathed by patient: Chest, Abdomen, Front perineal area, Right upper leg, Left upper leg, Face, Buttocks, Right lower leg, Left lower leg, Left arm   Body parts bathed by helper: Right arm, Buttocks, Left lower leg, Right lower leg     Bathing assist Assist Level: Supervision/Verbal cueing     Upper Body Dressing/Undressing Upper body dressing   What is the patient wearing?: Pull over shirt    Upper body assist Assist Level: Minimal Assistance - Patient > 75%    Lower Body Dressing/Undressing Lower body dressing      What is the patient wearing?: Underwear/pull up, Pants     Lower body assist Assist for lower body dressing: Contact Guard/Touching assist     Toileting Toileting    Toileting assist Assist for toileting: Contact Guard/Touching assist     Transfers Chair/bed transfer  Transfers assist     Chair/bed transfer assist level: Contact Guard/Touching assist Chair/bed transfer assistive device: Walker, Clinical biochemist   Ambulation assist      Assist level: Contact Guard/Touching assist Assistive device: Walker-rolling Max distance: 125f   Walk 10 feet activity   Assist     Assist level: Contact Guard/Touching assist Assistive device: Walker-rolling, Orthosis   Walk 50  feet activity   Assist    Assist level: Contact Guard/Touching assist Assistive device: Walker-rolling, Orthosis    Walk 150 feet activity   Assist    Assist level: Contact Guard/Touching assist Assistive device: Walker-rolling, Orthosis    Walk 10 feet on uneven surface  activity   Assist Walk 10 feet on uneven surfaces activity did not occur: Safety/medical concerns         Wheelchair     Assist Is the patient using a wheelchair?: Yes Type of Wheelchair: Manual    Wheelchair assist level: Maximal Assistance - Patient 25 - 49% Max wheelchair distance: 60 ft    Wheelchair 50 feet with 2 turns  activity    Assist        Assist Level: Maximal Assistance - Patient 25 - 49%   Wheelchair 150 feet activity     Assist      Assist Level: Total Assistance - Patient < 25%   Blood pressure 118/67, pulse (!) 51, temperature 98.2 F (36.8 C), resp. rate (!) 24, weight 81.9 kg, SpO2 98 %.  Medical Problem List and Plan: 1. 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 Had a prior Left hemiparesis but has more weakness since most recent CVA              -patient may  shower             -ELOS/Goals: 05/24/2022   -Continue CIR therapies including PT, OT   2.  Antithrombotics: -DVT/anticoagulation:  Pharmaceutical: Heparin- switch to lovenox , no renal disease              -antiplatelet therapy: Aspirin 81 mg daily resumed on 05/07/2022 3. Pain Management: Topamax 50 mg daily for headache 4. Mood/Behavior/Sleep: Provide emotional support             -antipsychotic agents: N/A 5. Neuropsych/cognition: This patient is capable of making decisions on his own behalf. 6. Skin/Wound Care: Routine skin checks 7. Fluids/Electrolytes/Nutrition: Routine in and outs with follow-up chemistries hypoK+ on HCTZ start daily KCL,     Latest Ref Rng & Units 05/21/2022    5:51 AM 05/14/2022    7:14 AM 05/08/2022    5:36 AM  BMP  Glucose 70 - 99 mg/dL 93  188  244   BUN 8 - 23 mg/dL '25  19  17   '$ Creatinine 0.61 - 1.24 mg/dL 1.09  0.95  1.14   Sodium 135 - 145 mmol/L 139  137  136   Potassium 3.5 - 5.1 mmol/L 3.2  3.1  3.3   Chloride 98 - 111 mmol/L 105  101  98   CO2 22 - 32 mmol/L '23  28  30   '$ Calcium 8.9 - 10.3 mg/dL 8.9  9.0  9.0    3/4 Persistent hypokalemia-adjust to 66mq bid  8.  Hypothyroidism.  Synthroid 9.  Diabetes mellitus.  Hemoglobin A1c 8.3.  Currently on SSI.  Patient was on Levemir 39 units twice daily prior to admission as well as Farxiga 10 mg daily and Trulicity 3 mg every Thursday.  Resume as needed\ CBG  (last 3)  Recent Labs    05/20/22 2104 05/21/22 0603 05/21/22 1108  GLUCAP 233* 112* 164*  Restart levimir, 15U qhs 2/20, increase to 25U on 2/21, increase to 35 U on 2/22, increased to 45 units on 2/24; 55 U 2/25,  Increase to 60U on 2/26 - am CBG on low side reduce  to 57U Restart farxiga '5mg'$   tomorrow to address daytime cbgs, titrate up as needed  3/1  increased faxiga to '10mg'$   3/4 control still inconsistent. No pattern. Obsv today/SSI  10.  Hypertension.  Coreg 12.5 mg twice daily, HCTZ 25 mg daily.  Monitor with increased mobility Vitals:   05/21/22 0811 05/21/22 1238  BP: 122/71 118/67  Pulse: (!) 59 (!) 51  Resp:  (!) 24  Temp:  98.2 F (36.8 C)  SpO2:  98%  Some lability will monitor prior to dosage change   - normotensive 3/4  11.  Hyperlipidemia.  Crestor 12.  Urinary incontinence  will try to wean off condom cath     LOS: 14 days A FACE TO FACE EVALUATION WAS PERFORMED  Meredith Staggers 05/21/2022, 1:08 PM

## 2022-05-21 NOTE — Discharge Summary (Signed)
Physician Discharge Summary  Patient ID: Joseph Ortega MRN: [id number] DOB/AGE: [birth year] 65 y.o.  Admit date: 05/07/2022 Discharge date: 05/26/2022  Discharge Diagnoses:  Principal Problem:   Right middle cerebral artery stroke [facility]) DVT prophylaxis Hypothyroidism Diabetes mellitus Hypertension Hyperlipidemia Yersinia enterocolitica  Discharged Condition: Stable  Significant Diagnostic Studies: EP PPM/ICD IMPLANT  Result Date: 05/07/2022 CONCLUSIONS:  1. Successful implantation of a implantable loop recorder for Cryptogenic stroke  2. No early apparent complications.   CT HEAD WO CONTRAST (5MM)  Result Date: 05/06/2022 CLINICAL DATA:  Stroke, follow-up EXAM: CT HEAD WITHOUT CONTRAST TECHNIQUE: Contiguous axial images were obtained from the base of the skull through the vertex without intravenous contrast. RADIATION DOSE REDUCTION: This exam was performed according to the departmental dose-optimization program which includes automated exposure control, adjustment of the mA and/or kV according to patient size and/or use of iterative reconstruction technique. COMPARISON:  05/02/2022 CT head FINDINGS: Brain: Redemonstrated infarction with petechial hemorrhage involving the right posterior parietal lobe, with the area of infarction measuring approximately 8.2 x 3.6 x 2.0 cm (AP x TR x CC) (series 4, image 18 and series 5, image 45), previously 8.1 x 3.6 x 3.9 cm when remeasured similarly, overall unchanged. No frank hematoma is seen. Overall unchanged degree of surrounding edema, without significant midline shift. Redemonstrated remote infarcts in the posterior right frontal lobe and right occipital lobe. No hydrocephalus or extra-axial collection. No evidence of additional acute infarct. Vascular: No hyperdense vessel. Atherosclerotic calcifications in the intracranial carotid and vertebral arteries. Skull: No acute fracture or focal lesion. Sinuses/Orbits: Clear paranasal sinuses. Status post  bilateral lens replacements. Other: The mastoids are well aerated. IMPRESSION: 1. Overall unchanged size of a right parietal infarct with petechial hemorrhage. No frank hematoma is seen. Overall unchanged degree of surrounding edema, without significant midline shift. 2. Redemonstrated remote infarcts in the posterior right frontal lobe and right occipital lobe. Electronically Signed   By: Merilyn Baba M.D.   On: 05/06/2022 19:01   VAS [place] LOWER EXTREMITY VENOUS (DVT)  Result Date: 05/04/2022  Lower Venous DVT Study Patient Name:  Joseph Ortega  Date of Exam:   05/03/2022 Medical Rec #: [id number]     Accession #:    [id number] Date of Birth: Jul 01, 1955     Patient Gender: M Patient Age:   67 years Exam Location:  [facility] Procedure:      VAS [place] LOWER EXTREMITY VENOUS (DVT) Referring Phys: Janine Ores --------------------------------------------------------------------------------  Indications: Stroke.  Comparison Study: 03-12-2021 Bilateral lower extremity venous study was negative                   for DVT. Performing Technologist: Darlin Coco RDMS, RVT  Examination Guidelines: A complete evaluation includes B-mode imaging, spectral Doppler, color Doppler, and power Doppler as needed of all accessible portions of each vessel. Bilateral testing is considered an integral part of a complete examination. Limited examinations for reoccurring indications may be performed as noted. The reflux portion of the exam is performed with the patient in reverse Trendelenburg.  +---------+---------------+---------+-----------+----------+--------------+ RIGHT    CompressibilityPhasicitySpontaneityPropertiesThrombus Aging +---------+---------------+---------+-----------+----------+--------------+ CFV      Full           Yes      Yes                                 +---------+---------------+---------+-----------+----------+--------------+ SFJ      Full                                                         +---------+---------------+---------+-----------+----------+--------------+  FV Prox  Full                                                        +---------+---------------+---------+-----------+----------+--------------+ FV Mid   Full                                                        +---------+---------------+---------+-----------+----------+--------------+ FV DistalFull                                                        +---------+---------------+---------+-----------+----------+--------------+ PFV      Full                                                        +---------+---------------+---------+-----------+----------+--------------+ POP      Full           Yes      Yes                                 +---------+---------------+---------+-----------+----------+--------------+ PTV      Full                                                        +---------+---------------+---------+-----------+----------+--------------+ PERO     Full                                                        +---------+---------------+---------+-----------+----------+--------------+   +---------+---------------+---------+-----------+----------+--------------+ LEFT     CompressibilityPhasicitySpontaneityPropertiesThrombus Aging +---------+---------------+---------+-----------+----------+--------------+ CFV      Full           Yes      Yes                                 +---------+---------------+---------+-----------+----------+--------------+ SFJ      Full                                                        +---------+---------------+---------+-----------+----------+--------------+ FV Prox  Full                                                        +---------+---------------+---------+-----------+----------+--------------+  FV Mid   Full                                                         +---------+---------------+---------+-----------+----------+--------------+ FV DistalFull                                                        +---------+---------------+---------+-----------+----------+--------------+ PFV      Full                                                        +---------+---------------+---------+-----------+----------+--------------+ POP      Full           Yes      Yes                                 +---------+---------------+---------+-----------+----------+--------------+ PTV      Full                                                        +---------+---------------+---------+-----------+----------+--------------+ PERO     Full                                                        +---------+---------------+---------+-----------+----------+--------------+     Summary: RIGHT: - There is no evidence of deep vein thrombosis in the lower extremity.  - No cystic structure found in the popliteal fossa.  LEFT: - There is no evidence of deep vein thrombosis in the lower extremity.  - No cystic structure found in the popliteal fossa.  *See table(s) above for measurements and observations. Electronically signed by Deitra Mayo MD on 05/04/2022 at 10:31:15 AM.    Final    ECHOCARDIOGRAM COMPLETE  Result Date: 05/03/2022    ECHOCARDIOGRAM REPORT   Patient Name:   Joseph Ortega Date of Exam: 05/03/2022 Medical Rec #:  [id number]    Height:       73.0 in Accession #:    [id number]   Weight:       261.8 lb Date of Birth:  January 17, 1956    BSA:          2.413 m Patient Age:    33 years     BP:           143/69 mmHg Patient Gender: M            HR:           60 bpm. Exam Location:  Inpatient Procedure: 2D Echo, 3D Echo, Cardiac Doppler, Color Doppler and Intracardiac            Opacification  Agent Indications:    Stroke I63.9  History:        Patient has prior history of Echocardiogram examinations, most                 recent 03/11/2021. Stroke; Risk  Factors:Hypertension, Diabetes                 and Former Smoker.  Sonographer:    Greer Pickerel Referring Phys: [phone] MCNEILL P KIRKPATRICK  Sonographer Comments: Suboptimal subcostal window. Image acquisition challenging due to respiratory motion and Image acquisition challenging due to patient body habitus. IMPRESSIONS  1. Hypokinesis / akinesis of the distal septal, distal inferior and apical walls Overall unchanged from echo done in Dec 2022. Left ventricular ejection fraction, by estimation, is 50 to 55%. The left ventricle has low normal function. There is mild left ventricular hypertrophy. Left ventricular diastolic parameters are consistent with Grade I diastolic dysfunction (impaired relaxation).  2. Right ventricular systolic function is normal. The right ventricular size is normal.  3. Left atrial size was moderately dilated.  4. A small pericardial effusion is present.  5. The mitral valve is normal in structure. Mild mitral valve regurgitation.  6. The aortic valve is tricuspid. Aortic valve regurgitation is trivial. FINDINGS  Left Ventricle: Hypokinesis / akinesis of the distal septal, distal inferior and apical walls Overall unchanged from echo done in Dec 2022. Left ventricular ejection fraction, by estimation, is 50 to 55%. The left ventricle has low normal function. Definity contrast agent was given IV to delineate the left ventricular endocardial borders. The left ventricular internal cavity size was normal in size. There is mild left ventricular hypertrophy. Left ventricular diastolic parameters are consistent with Grade I diastolic dysfunction (impaired relaxation). Right Ventricle: The right ventricular size is normal. Right vetricular wall thickness was not assessed. Right ventricular systolic function is normal. Left Atrium: Left atrial size was moderately dilated. Right Atrium: Right atrial size was normal in size. Pericardium: A small pericardial effusion is present. Mitral Valve: The mitral  valve is normal in structure. Mild mitral valve regurgitation. Tricuspid Valve: The tricuspid valve is normal in structure. Tricuspid valve regurgitation is trivial. Aortic Valve: The aortic valve is tricuspid. Aortic valve regurgitation is trivial. Pulmonic Valve: The pulmonic valve was normal in structure. Pulmonic valve regurgitation is not visualized. Aorta: The aortic root and ascending aorta are structurally normal, with no evidence of dilitation. IAS/Shunts: No atrial level shunt detected by color flow Doppler.  LEFT VENTRICLE PLAX 2D LVIDd:         5.20 cm   Diastology LVIDs:         2.80 cm   LV e' medial:    4.12 cm/s LV PW:         1.30 cm   LV E/e' medial:  18.1 LV IVS:        1.30 cm   LV e' lateral:   8.06 cm/s LVOT diam:     1.95 cm   LV E/e' lateral: 9.2 LV SV:         87 LV SV Index:   36 LVOT Area:     2.99 cm                           3D Volume EF:                          3D EF:  53 %                          LV EDV:       273 ml                          LV ESV:       129 ml                          LV SV:        143 ml RIGHT VENTRICLE RV S prime:     11.30 cm/s TAPSE (M-mode): 2.1 cm LEFT ATRIUM              Index        RIGHT ATRIUM           Index LA diam:        5.00 cm  2.07 cm/m   RA Area:     22.50 cm LA Vol (A2C):   120.0 ml 49.74 ml/m  RA Volume:   62.40 ml  25.87 ml/m LA Vol (A4C):   133.0 ml 55.13 ml/m LA Biplane Vol: 131.0 ml 54.30 ml/m  AORTIC VALVE LVOT Vmax:   119.00 cm/s LVOT Vmean:  74.400 cm/s LVOT VTI:    0.290 m  AORTA Ao Root diam: 3.40 cm Ao Asc diam:  3.40 cm MITRAL VALVE MV Area (PHT): 3.08 cm     SHUNTS MV Decel Time: 246 msec     Systemic VTI:  0.29 m MV E velocity: 74.50 cm/s   Systemic Diam: 1.95 cm MV A velocity: 108.00 cm/s MV E/A ratio:  0.69 Dorris Carnes MD Electronically signed by Dorris Carnes MD Signature Date/Time: 05/03/2022/12:22:38 PM    Final    MR BRAIN WO CONTRAST  Result Date: 05/02/2022 CLINICAL DATA:  Stroke suspected EXAM: MRI HEAD WITHOUT  CONTRAST MRA HEAD WITHOUT CONTRAST MRA NECK WITHOUT CONTRAST TECHNIQUE: Multiplanar, multi-echo pulse sequences of the brain and surrounding structures were acquired without intravenous contrast. Angiographic images of the Circle of Willis were acquired using MRA technique without intravenous contrast. Angiographic images of the neck were acquired using MRA technique without intravenous contrast. Carotid stenosis measurements (when applicable) are obtained utilizing NASCET criteria, using the distal internal carotid diameter as the denominator. COMPARISON:  07/11/2021 MRI head, no prior MRA, correlation is made with CT head 05/02/2022, CTA head 03/11/2021, and CTA neck 03/14/2021 FINDINGS: Evaluation is limited by motion artifact. In addition, the MRI head could not be completed. Axial T1 and susceptibility weighted sequences could not be obtained. MRI HEAD FINDINGS Brain: Redemonstrated area of infarction with hemorrhage in the right temporoparietal region, which appears overall similar to the same day CT, when allowing for differences in technique. Surrounding T2 hyperintense signal, likely a combination of cytotoxic and vasogenic edema. Mass effect on the right lateral ventricle, particularly the atrium and occipital horn, without evidence of entrapment of the temporal horn. 2 mm right-to-left midline shift. Vascular: Unable to evaluate the arterial flow voids due to motion. Skull and upper cervical spine: Grossly normal marrow signal. Sinuses/Orbits: Unable to evaluate. Other: None. MRA HEAD FINDINGS Anterior circulation: Both internal carotid arteries are patent to the termini, without significant stenosis. A1 segments patent A 1 and M 1 segments. Evaluation of the distal vessels is limited by motion, but they appear patent. Patent. Normal anterior communicating artery. Anterior cerebral arteries are patent  to their distal aspects. No M1 stenosis or occlusion. Normal MCA bifurcations. Distal MCA branches  perfused and symmetric. Posterior circulation: Vertebral arteries patent to the vertebrobasilar junction without stenosis. Basilar patent to its distal aspect. Superior cerebellar arteries patent bilaterally. Patent P1 segments. Suspect severe stenosis in the left P2 (series 2, image 91), although evaluation is limited by motion. No evidence aneurysm or vascular malformation. Anatomic variants: None significant MRA NECK FINDINGS No evidence of hemodynamically significant stenosis, dissection, or occlusion of the imaged portions the carotid and vertebral arteries. IMPRESSION: 1. Evaluation is limited by motion artifact. In addition, the MRI head could not be completed in its entirety. 2. Redemonstrated area of infarction with hemorrhage in the right temporoparietal region, which appears overall similar to the same day CT, when allowing for differences in technique. Edema causes mass effect on the right lateral ventricle and approximately 2 mm right-to-left midline shift. 3. No intracranial large vessel occlusion. Suspect severe stenosis in the left P2, although evaluation is limited by motion. 4. No hemodynamically significant stenosis in the neck. Electronically Signed   By: Merilyn Baba M.D.   On: 05/02/2022 22:39   MR ANGIO HEAD WO CONTRAST  Result Date: 05/02/2022 CLINICAL DATA:  Stroke suspected EXAM: MRI HEAD WITHOUT CONTRAST MRA HEAD WITHOUT CONTRAST MRA NECK WITHOUT CONTRAST TECHNIQUE: Multiplanar, multi-echo pulse sequences of the brain and surrounding structures were acquired without intravenous contrast. Angiographic images of the Circle of Willis were acquired using MRA technique without intravenous contrast. Angiographic images of the neck were acquired using MRA technique without intravenous contrast. Carotid stenosis measurements (when applicable) are obtained utilizing NASCET criteria, using the distal internal carotid diameter as the denominator. COMPARISON:  07/11/2021 MRI head, no prior MRA,  correlation is made with CT head 05/02/2022, CTA head 03/11/2021, and CTA neck 03/14/2021 FINDINGS: Evaluation is limited by motion artifact. In addition, the MRI head could not be completed. Axial T1 and susceptibility weighted sequences could not be obtained. MRI HEAD FINDINGS Brain: Redemonstrated area of infarction with hemorrhage in the right temporoparietal region, which appears overall similar to the same day CT, when allowing for differences in technique. Surrounding T2 hyperintense signal, likely a combination of cytotoxic and vasogenic edema. Mass effect on the right lateral ventricle, particularly the atrium and occipital horn, without evidence of entrapment of the temporal horn. 2 mm right-to-left midline shift. Vascular: Unable to evaluate the arterial flow voids due to motion. Skull and upper cervical spine: Grossly normal marrow signal. Sinuses/Orbits: Unable to evaluate. Other: None. MRA HEAD FINDINGS Anterior circulation: Both internal carotid arteries are patent to the termini, without significant stenosis. A1 segments patent A 1 and M 1 segments. Evaluation of the distal vessels is limited by motion, but they appear patent. Patent. Normal anterior communicating artery. Anterior cerebral arteries are patent to their distal aspects. No M1 stenosis or occlusion. Normal MCA bifurcations. Distal MCA branches perfused and symmetric. Posterior circulation: Vertebral arteries patent to the vertebrobasilar junction without stenosis. Basilar patent to its distal aspect. Superior cerebellar arteries patent bilaterally. Patent P1 segments. Suspect severe stenosis in the left P2 (series 2, image 91), although evaluation is limited by motion. No evidence aneurysm or vascular malformation. Anatomic variants: None significant MRA NECK FINDINGS No evidence of hemodynamically significant stenosis, dissection, or occlusion of the imaged portions the carotid and vertebral arteries. IMPRESSION: 1. Evaluation is  limited by motion artifact. In addition, the MRI head could not be completed in its entirety. 2. Redemonstrated area of infarction with hemorrhage in  the right temporoparietal region, which appears overall similar to the same day CT, when allowing for differences in technique. Edema causes mass effect on the right lateral ventricle and approximately 2 mm right-to-left midline shift. 3. No intracranial large vessel occlusion. Suspect severe stenosis in the left P2, although evaluation is limited by motion. 4. No hemodynamically significant stenosis in the neck. Electronically Signed   By: Merilyn Baba M.D.   On: 05/02/2022 22:39   MR ANGIO NECK WO CONTRAST  Result Date: 05/02/2022 CLINICAL DATA:  Stroke suspected EXAM: MRI HEAD WITHOUT CONTRAST MRA HEAD WITHOUT CONTRAST MRA NECK WITHOUT CONTRAST TECHNIQUE: Multiplanar, multi-echo pulse sequences of the brain and surrounding structures were acquired without intravenous contrast. Angiographic images of the Circle of Willis were acquired using MRA technique without intravenous contrast. Angiographic images of the neck were acquired using MRA technique without intravenous contrast. Carotid stenosis measurements (when applicable) are obtained utilizing NASCET criteria, using the distal internal carotid diameter as the denominator. COMPARISON:  07/11/2021 MRI head, no prior MRA, correlation is made with CT head 05/02/2022, CTA head 03/11/2021, and CTA neck 03/14/2021 FINDINGS: Evaluation is limited by motion artifact. In addition, the MRI head could not be completed. Axial T1 and susceptibility weighted sequences could not be obtained. MRI HEAD FINDINGS Brain: Redemonstrated area of infarction with hemorrhage in the right temporoparietal region, which appears overall similar to the same day CT, when allowing for differences in technique. Surrounding T2 hyperintense signal, likely a combination of cytotoxic and vasogenic edema. Mass effect on the right lateral  ventricle, particularly the atrium and occipital horn, without evidence of entrapment of the temporal horn. 2 mm right-to-left midline shift. Vascular: Unable to evaluate the arterial flow voids due to motion. Skull and upper cervical spine: Grossly normal marrow signal. Sinuses/Orbits: Unable to evaluate. Other: None. MRA HEAD FINDINGS Anterior circulation: Both internal carotid arteries are patent to the termini, without significant stenosis. A1 segments patent A 1 and M 1 segments. Evaluation of the distal vessels is limited by motion, but they appear patent. Patent. Normal anterior communicating artery. Anterior cerebral arteries are patent to their distal aspects. No M1 stenosis or occlusion. Normal MCA bifurcations. Distal MCA branches perfused and symmetric. Posterior circulation: Vertebral arteries patent to the vertebrobasilar junction without stenosis. Basilar patent to its distal aspect. Superior cerebellar arteries patent bilaterally. Patent P1 segments. Suspect severe stenosis in the left P2 (series 2, image 91), although evaluation is limited by motion. No evidence aneurysm or vascular malformation. Anatomic variants: None significant MRA NECK FINDINGS No evidence of hemodynamically significant stenosis, dissection, or occlusion of the imaged portions the carotid and vertebral arteries. IMPRESSION: 1. Evaluation is limited by motion artifact. In addition, the MRI head could not be completed in its entirety. 2. Redemonstrated area of infarction with hemorrhage in the right temporoparietal region, which appears overall similar to the same day CT, when allowing for differences in technique. Edema causes mass effect on the right lateral ventricle and approximately 2 mm right-to-left midline shift. 3. No intracranial large vessel occlusion. Suspect severe stenosis in the left P2, although evaluation is limited by motion. 4. No hemodynamically significant stenosis in the neck. Electronically Signed   By:  Merilyn Baba M.D.   On: 05/02/2022 22:39   CT Head Wo Contrast  Result Date: 05/02/2022 CLINICAL DATA:  Headache.  Worsening frequency or severity. EXAM: CT HEAD WITHOUT CONTRAST TECHNIQUE: Contiguous axial images were obtained from the base of the skull through the vertex without intravenous contrast. RADIATION DOSE  REDUCTION: This exam was performed according to the departmental dose-optimization program which includes automated exposure control, adjustment of the mA and/or kV according to patient size and/or use of iterative reconstruction technique. COMPARISON:  MRI 07/11/2021.  CT 03/11/2021. FINDINGS: Brain: Acute hemorrhagic infarction affecting the right posterior parietal lobe. The region of infarction measures a proximally 7.6 x 3.6 x 4.1 cm. There is petechial bleeding through the region of infarction, without a frank focal hematoma. Old infarctions at the right frontoparietal vertex and right occipital lobe are present as seen previously. No visible subarachnoid blood today. No midline shift. No hydrocephalus. Vascular: There is atherosclerotic calcification of the major vessels at the base of the brain. Skull: Negative Sinuses/Orbits: Clear/normal Other: None IMPRESSION: 1. Acute hemorrhagic infarction affecting the right posterior parietal lobe. The region of infarction measures 7.6 x 3.6 x 4.1 cm. There is petechial bleeding through the region of infarction, without a frank focal hematoma. No midline shift. 2. Old infarctions at the right frontoparietal vertex and right occipital lobe as seen previously. 3. Critical Value/emergent results were called by telephone at the time of interpretation on 05/02/2022 at 2:55 pm to provider EDP , who verbally acknowledged these results. Electronically Signed   By: Nelson Chimes M.D.   On: 05/02/2022 15:03   DG Chest Port 1 View  Result Date: 05/02/2022 CLINICAL DATA:  Chest pain EXAM: PORTABLE CHEST 1 VIEW COMPARISON:  Chest x-ray dated July 27th 2021  FINDINGS: The heart size and mediastinal contours are within normal limits. Bibasilar atelectasis. Both lungs are clear. The visualized skeletal structures are unremarkable. IMPRESSION: No active disease. Electronically Signed   By: Yetta Glassman M.D.   On: 05/02/2022 14:00    Labs:  Basic Metabolic Panel: Recent Labs  Lab 05/21/22 0551 05/23/22 0707 05/25/22 1104  NA 139 137 137  K 3.2* 4.1 3.5  CL 105 101 101  CO2 23 26 25   GLUCOSE 93 114* 161*  BUN 25* 33* 18  CREATININE 1.09 1.22 0.94  CALCIUM 8.9 9.0 9.2    CBC: No results for input(s): "WBC", "NEUTROABS", "HGB", "HCT", "MCV", "PLT" in the last 168 hours.  CBG: Recent Labs  Lab 05/26/22 1225 05/26/22 1647 05/26/22 2108 05/27/22 0602 05/27/22 1156  GLUCAP 175* 149* 176* 127* 148*   Family history.  Father with diabetes.  Denies any colon cancer esophageal cancer or rectal cancer  Brief HPI:   Joseph Ortega is a 67 y.o. 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  Per chart review lives with wife.  1 level home with a ramped entrance.  Wife is reportedly limited mobility.  Presented 05/02/2022 with headache and nonspecific chest pain.  Cranial CT scan showed acute hemorrhagic infarction affecting the right posterior parietal lobe.  The region of infarction measuring 7.6 x 3.6 x 4.1 cm.  Old infarction of the right frontal parietal vertex and right occipital lobe.  CT angiogram head and neck no large vessel occlusion.  MRI redemonstrated infarction with hemorrhage in the right temporal parietal region.  Patient did not receive tPA.  Echocardiogram with ejection fraction of 50 to 60% grade 1 diastolic dysfunction.  Neurology follow-up placed on Cleviprex for blood pressure control.  His aspirin was initially held and has since been resumed.  He was later cleared to begin subcutaneous heparin for DVT prophylaxis  05/05/2022.  Latest cranial CT scan 05/06/2022 unchanged no frank hematoma seen.  Tolerating a  regular consistency diet.  Therapy evaluations completed due to patient decreased functional mobility was admitted for a comprehensive rehab program.   Hospital Course: Joseph Ortega was admitted to rehab 05/07/2022 for inpatient therapies to consist of PT, ST and OT at least three hours five days a week. Past admission physiatrist, therapy team and rehab RN have worked together to provide customized collaborative inpatient rehab.  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  Subcutaneous Lovenox for DVT prophylaxis no bleeding episodes.  Bouts of headache relieved with Topamax.  Blood pressure controlled on Coreg as well as HCTZ and would need outpatient follow-up.  His Lasix, Diovan and Lopressor were held for permissive hypertension. Synthroid ongoing for hypothyroidism.  Diabetes mellitus hemoglobin A1c 8.3 insulin therapy as well as [place] for now.  Patient also been taking Trulicity 3 mg every Thursday prior to admission.  Crestor for hyperlipidemia.  Patient did develop some nausea and vomiting x 2 days placed on gentle IV fluids.  Chemistries remain unremarkable.  Gastrointestinal panel revealed Yersinia entercoolitica and treated conservatively and patient doing very well appetite improved.   Blood pressures were monitored on TID basis and controlled  Diabetes has been monitored with ac/hs CBG checks and SSI was use prn for tighter BS control.    Rehab course: During patient's stay in rehab weekly team conferences were held to monitor patient's progress, set goals and discuss barriers to discharge. At admission, patient required minimal assist 75 feet rolling walker minimal assist sit to stand  Physical exam.  Blood pressure 139/67 pulse 63 temperature 97.9 respirations 16 oxygen saturation is 96%  room air Constitutional.  No acute distress HEENT Head.  Normocephalic and atraumatic Eyes.  Pupils round and reactive to light no discharge without nystagmus Neck.  Supple nontender no JVD without thyromegaly Cardiac regular rate and rhythm without any extra sounds or murmur heard Abdomen.  Soft nontender positive bowel sounds without rebound Respiratory effort normal no respiratory distress without wheeze Extremity.  No clubbing cyanosis or edema Neurologic.  Cranial nerves II through XII intact motor strength 5/5 in right deltoid, bicep, tricep, grip, hip flexor, knee extensor, ankle dorsi plantarflexion 3 - left deltoid, bicep tricep grip 4 - hip flexors knee extension ankle dorsiflexion Sensory exam normal sensation  He/She  has had improvement in activity tolerance, balance, postural control as well as ability to compensate for deficits. He/She has had improvement in functional use RUE/LUE  and RLE/LLE as well as improvement in awareness.  Patient transition from supine to sitting edge of bed with the use of bed rail with supervision.  Ambulates 150 feet from his room to the main rehab gym rolling walker/AFO standby assist supervision for safety.  Patient completed all functional transfers bed to wheelchair with minimal assist and rolling walker.  Donned both socks with standby assist.  Able to don his right shoe without assistance.  Speech therapy emphasis on cognitive skill training within the context of functional independent ADLs related to task.  Patient participated in active discussion regarding current medications asking questions.  Completed pill organizer task with 100% accuracy.  Full family teaching completed plan discharge to home       Disposition: Discharge to home    Diet: Diabetic diet  Special Instructions: No driving smoking or alcohol  Medications at discharge 1.  Tylenol as needed 2.  Aspirin 81 mg p.o. daily 3.  Coreg 12.5 mg p.o. twice daily  4.  Farxiga 10  mg p.o. daily 5.  Cymbalta 20 mg p.o. twice daily 6.  Hydrochlorothiazide 12.5 mg p.o. daily 7.  Levemir 50 units nightly 8.  Synthroid 125 mcg p.o. daily 9.  Robaxin 500 mg p.o. every 6 hours as needed muscle spasms 10.  Protonix 40 mg p.o. daily 11.  Crestor 40 mg p.o. daily 12.  Topamax 50 mg p.o. daily   13 Trulicity 3 mg under the skin every Friday     30-35 minutes were spent completing discharge summary and discharge planning  Discharge Instructions     Ambulatory referral to Neurology   Complete by: As directed    An appointment is requested in approximately: 4 weeks right MCA infarction   Ambulatory referral to Physical Medicine Rehab   Complete by: As directed    Moderate complexity follow-up 1 to 2 weeks right MCA infarction        Follow-up Information     Kirsteins, Luanna Salk, MD Follow up.   Specialty: Physical Medicine and Rehabilitation Why: Office to call for appointment Contact information: [place] [address] [phone]         Vickie Epley, MD Follow up.   Specialties: Cardiology, Radiology Why: Call for appointment Contact information: [place] [address] [phone]                 Signed: Cathlyn Parsons 05/27/2022, 5:09 PM

## 2022-05-21 NOTE — Progress Notes (Signed)
Speech Language Pathology Daily Session Note  Patient Details  Name: Joseph Ortega MRN: [id number] Date of Birth: [birth year]  Today's Date: 05/21/2022 SLP Individual Time: [id number] SLP Individual Time Calculation (min): 45 min  Short Term Goals: Week 2: SLP Short Term Goal 1 (Week 2): Patient will recall and use memory compensations with sup A verbal cues for effectiveness SLP Short Term Goal 2 (Week 2): Patient will complete complex problem solving with iADL tasks including medication and/or money management with sup A verbal cues SLP Short Term Goal 3 (Week 2): Patient will complete scheduling and/or organization tasks with sup A verbal cues  Skilled Therapeutic Interventions:   Pt was seen in PM to address cognitive re- training. Pt was alert and seated upright in WC upon SLP arrival. He was agreeable for session. Pt able to verbalize PMHx including hx of CVAs and symptoms leading up to most recent CVA. He also verbalized recent hx of deficits in bill and medication management. SLP engaged pt in problem solving for medication management. Given scenarios presented verbally, SLP challenged pt to identify solutions which he completed with 67% acc with mod verbal cues. In missed opportunities, SLP provided correct response and rationale. In additional minutes of session, SLP addressed completion of medication management through functional tasks. Pt identified some of his medications indep. He demonstrated ability to use pill organizer to sort x1 medication with min verbal assist. Pt left seated upright in [facility] with chair alarm active. SLP to continue POC.   Pain  No pain.  Therapy/Group: Individual Therapy  Colin Benton 05/21/2022, 3:33 PM

## 2022-05-21 NOTE — Progress Notes (Signed)
Physical Therapy Session Note  Patient Details  Name: Joseph Ortega MRN: [id number] Date of Birth: [birth year]  Today's Date: 05/21/2022 PT Individual Time: 1446-1530 PT Individual Time Calculation (min): 44 min   Short Term Goals: Week 2:  PT Short Term Goal 1 (Week 2): = to LTGs based on ELOS  Skilled Therapeutic Interventions/Progress Updates:      Direct handoff of care from SLP to start with patient siting in w/c and in agreement to therapy session. Offered to take patient outside to prepare for tomorrow's outing with TR and OT - transported outside near [facility]. Patient completes sit<>stands to RW with CGA from w/c height - able to strap in LUE into RW splint with min cues. Gait training while outdoors on unlevel paved surfaces with mostly CGA using his RW - x1 LOB that needed minA for correction while he was walking up an incline and began to fall to the R. While outdoors, discussed DC planning, home setup, and PT goals. Pt reports concern about going home and being the primary caregiver for his wife, where he assisted with bathing/toileting/cooking/driving - discussed fall prevention strategies, food delivery services, and other resources to reduce burden. Patient reports his goals are to get off of the RW and eventually be able to use a cane as the walker is "too bulky" for his home. Returned upstairs to his room and concluded session in w/c with alarm on and call bell in reach. Patient very appreciative of being outside.    Therapy Documentation Precautions:  Precautions Precautions: Fall Precaution Comments: L sided weakness; BP <150 Restrictions Weight Bearing Restrictions: No Other Position/Activity Restrictions: Needs Loop Monitor cardiac device wiht him in therapy General:     Therapy/Group: Individual Therapy  Latrice Storlie P Barbara Ahart PT 05/21/2022, 3:32 PM

## 2022-05-21 NOTE — Progress Notes (Signed)
Occupational Therapy Session Note  Patient Details  Name: Joseph Ortega MRN: [id number] Date of Birth: May 21, 1955  Today's Date: 05/21/2022 OT Individual Time: 0803-0900 OT Individual Time Calculation (min): 57 min    Short Term Goals: Week 2:  OT Short Term Goal 1 (Week 2): STG=LTG d/t Pt ELOS  Skilled Therapeutic Interventions/Progress Updates:  Skilled OT intervention completed with focus on functional endurance and ADL retraining within a shower context. Pt received upright in bed, agreeable to session. No pain reported.  Pt requesting to use bathroom/shower. Completed bed mobility with supervision using bed rail and increased time for L hemi-body management. Able to stand with min A using RW with L hand splint, then ambulated with CGA using RW to [facility] over toilet with cues needed for L foot clearance over bathroom threshold, then CGA for standing balance to doff pants. Continent of BM only; charted. Stood with CGA from [facility] for peri-care with CGA for balance. Ambulated with CGA using RW to tub bench, then min A stand pivot using grab bar into shower.   Waterproof covered applied to heart loop incision, then pt able to shower at the overall intermittent supervision level. Utilized long handled sponge for LB, with supervision sit > stand using grab bar and for duration of stance for peri-hygiene. Of note- pt using back of knees on tub bench for balance, with cues needed to avoid this in case bench/seat in his shower isn't up against sturdy surface. CGA stand pivot with grab bar to w/c, then CGA sit > stand and stand pivot without AD to EOB.   Able to donn deo with set up A, shirt with mod A for hemi-technique; cues needed for orienting shirt though pt stated he donns his shirt a different way post prior strokes a different way independently. Cues needed for orienting LB clothing but physically able to do with CGA only for standing balance. Donned socks, L AFO with total A for time.  Pt remained  seated EOB, with bed alarm on/activated, and with all needs in reach at end of session.   Therapy Documentation Precautions:  Precautions Precautions: Fall Precaution Comments: L sided weakness; BP <150 Restrictions Weight Bearing Restrictions: No Other Position/Activity Restrictions: Needs Loop Monitor cardiac device wiht him in therapy  Therapy/Group: Individual Therapy  Blase Mess, MS, OTR/L  05/21/2022, 12:03 PM

## 2022-05-22 ENCOUNTER — Other Ambulatory Visit: Payer: Self-pay

## 2022-05-22 ENCOUNTER — Ambulatory Visit: Payer: BC Managed Care – PPO | Admitting: Physical Therapy

## 2022-05-22 ENCOUNTER — Encounter: Payer: Self-pay | Admitting: Occupational Therapy

## 2022-05-22 LAB — GLUCOSE, CAPILLARY
Glucose-Capillary: 128 mg/dL — ABNORMAL HIGH (ref 70–99)
Glucose-Capillary: 170 mg/dL — ABNORMAL HIGH (ref 70–99)
Glucose-Capillary: 182 mg/dL — ABNORMAL HIGH (ref 70–99)
Glucose-Capillary: 185 mg/dL — ABNORMAL HIGH (ref 70–99)

## 2022-05-22 MED ORDER — MUSCLE RUB 10-15 % EX CREA: TOPICAL_CREAM | Freq: Two times a day (BID) | CUTANEOUS | Status: DC | PRN

## 2022-05-22 NOTE — Progress Notes (Signed)
SLP Cancellation Note  Patient Details Name: Joseph Ortega MRN: [id number] DOB: Oct 05, 1955   Cancelled treatment:     Pt missed 45 minutes of skilled SLP intervention due to not feeling well. Pt reported fatigue and nausea and declined participation at this time.  NSG aware. SLP to reattempt as able.                                                                                                   [place] 05/22/2022, 3:00 PM

## 2022-05-22 NOTE — Therapy (Signed)
Occupational Therapy Discharge  OT made aware pt has been hospitalized with subsequent stroke after being sent away from clinic with BP outside of therapeutic parameters. Pt will require new therapy orders for OP OT if medically appropriate.   Murlean Caller, OTR/L 05/22/2022 [place] Phone: [phone] Fax: [phone]

## 2022-05-22 NOTE — Progress Notes (Signed)
Patient ID: Joseph Ortega, male   DOB: 09-28-1955, 67 y.o.   MRN: [id number]  OP- Neuro Rehab referral faxed.  [id number] F: [phone]

## 2022-05-22 NOTE — Progress Notes (Signed)
PROGRESS NOTE   Subjective/Complaints:  Poor appetite , emesis x 1 States he has had loose stool x 3-4 days but did not mention it, nsg note document 1 type 6 and 1 type 7 over the last 2 days  ROS: Patient denies CP, SOB,abd pain  Objective:   No results found. No results for input(s): "WBC", "HGB", "HCT", "PLT" in the last 72 hours.  Recent Labs    05/21/22 0551  NA 139  K 3.2*  CL 105  CO2 23  GLUCOSE 93  BUN 25*  CREATININE 1.09  CALCIUM 8.9     Intake/Output Summary (Last 24 hours) at 05/22/2022 0841 Last data filed at 05/21/2022 2037 Gross per 24 hour  Intake 472 ml  Output 2200 ml  Net -1728 ml         Physical Exam: Vital Signs Blood pressure (!) 158/82, pulse 69, temperature 97.7 F (36.5 C), resp. rate 18, weight 81.9 kg, SpO2 97 %.    General: No acute distress Mood and affect are appropriate Heart: Regular rate and rhythm no rubs murmurs or extra sounds Lungs: Clear to auscultation, breathing unlabored, no rales or wheezes Abdomen: Positive bowel sounds, soft nontender to palpation, nondistended Extremities: No clubbing, cyanosis, or edema Skin: No evidence of breakdown, no evidence of rash  Psych: pleasant and cooperative  Skin: No evidence of breakdown, no evidence of rash Neurologic: Cranial nerves II through XII intact, motor strength is 5/5 in right and 2+ to 3- left deltoid, bicep, tricep, grip, 5/5 right and 4-/5 left hip flexor, knee extensors, 1-1+ ankle.   Assessment/Plan: 1. Functional deficits which require 3+ hours per day of interdisciplinary therapy in a comprehensive inpatient rehab setting. Physiatrist is providing close team supervision and 24 hour management of active medical problems listed below. Physiatrist and rehab team continue to assess barriers to discharge/monitor patient progress toward functional and medical goals  Care Tool:  Bathing    Body parts bathed  by patient: Chest, Abdomen, Front perineal area, Right upper leg, Left upper leg, Face, Buttocks, Right lower leg, Left lower leg, Left arm   Body parts bathed by helper: Right arm, Buttocks, Left lower leg, Right lower leg     Bathing assist Assist Level: Supervision/Verbal cueing     Upper Body Dressing/Undressing Upper body dressing   What is the patient wearing?: Pull over shirt    Upper body assist Assist Level: Minimal Assistance - Patient > 75%    Lower Body Dressing/Undressing Lower body dressing      What is the patient wearing?: Underwear/pull up, Pants     Lower body assist Assist for lower body dressing: Contact Guard/Touching assist     Toileting Toileting    Toileting assist Assist for toileting: Contact Guard/Touching assist     Transfers Chair/bed transfer  Transfers assist     Chair/bed transfer assist level: Contact Guard/Touching assist Chair/bed transfer assistive device: Walker, Clinical biochemist   Ambulation assist      Assist level: Contact Guard/Touching assist Assistive device: Walker-rolling Max distance: 169f   Walk 10 feet activity   Assist     Assist level: Contact Guard/Touching assist Assistive device:  Walker-rolling, Orthosis   Walk 50 feet activity   Assist    Assist level: Contact Guard/Touching assist Assistive device: Walker-rolling, Orthosis    Walk 150 feet activity   Assist    Assist level: Contact Guard/Touching assist Assistive device: Walker-rolling, Orthosis    Walk 10 feet on uneven surface  activity   Assist Walk 10 feet on uneven surfaces activity did not occur: Safety/medical concerns         Wheelchair     Assist Is the patient using a wheelchair?: Yes Type of Wheelchair: Manual    Wheelchair assist level: Maximal Assistance - Patient 25 - 49% Max wheelchair distance: 60 ft    Wheelchair 50 feet with 2 turns activity    Assist        Assist  Level: Maximal Assistance - Patient 25 - 49%   Wheelchair 150 feet activity     Assist      Assist Level: Total Assistance - Patient < 25%   Blood pressure (!) 158/82, pulse 69, temperature 97.7 F (36.5 C), resp. rate 18, weight 81.9 kg, SpO2 97 %.  Medical Problem List and Plan: 1. 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 Had a prior Left hemiparesis but has more weakness since most recent CVA              -patient may  shower             -ELOS/Goals: 05/24/2022- May f/u with Dr Naaman Plummer in office   -Continue CIR therapies including PT, OT   2.  Antithrombotics: -DVT/anticoagulation:  Pharmaceutical: Heparin- switch to lovenox , no renal disease              -antiplatelet therapy: Aspirin 81 mg daily resumed on 05/07/2022 3. Pain Management: Topamax 50 mg daily for headache 4. Mood/Behavior/Sleep: Provide emotional support             -antipsychotic agents: N/A 5. Neuropsych/cognition: This patient is capable of making decisions on his own behalf. 6. Skin/Wound Care: Routine skin checks 7. Fluids/Electrolytes/Nutrition: Routine in and outs with follow-up chemistries hypoK+ on HCTZ start daily KCL,     Latest Ref Rng & Units 05/21/2022    5:51 AM 05/14/2022    7:14 AM 05/08/2022    5:36 AM  BMP  Glucose 70 - 99 mg/dL 93  188  244   BUN 8 - 23 mg/dL '25  19  17   '$ Creatinine 0.61 - 1.24 mg/dL 1.09  0.95  1.14   Sodium 135 - 145 mmol/L 139  137  136   Potassium 3.5 - 5.1 mmol/L 3.2  3.1  3.3   Chloride 98 - 111 mmol/L 105  101  98   CO2 22 - 32 mmol/L '23  28  30   '$ Calcium 8.9 - 10.3 mg/dL 8.9  9.0  9.0    3/4 Persistent hypokalemia-adjust to 36mq bid- recheck in am   8.  Hypothyroidism.  Synthroid 9.  Diabetes mellitus.  Hemoglobin A1c 8.3.  Currently on SSI.  Patient was on Levemir 39 units twice daily prior to admission as well as Farxiga 10 mg daily and Trulicity 3 mg every Thursday.  Resume as needed\ CBG  (last 3)  Recent Labs    05/21/22 1622 05/21/22 2054 05/22/22 0612  GLUCAP 157* 151* 128*   Restart levimir, 15U qhs 2/20, increase to 25U on 2/21, increase to 35 U on 2/22, increased to 45 units on  2/24; 55 U 2/25,  Increase to 60U on 2/26 - am CBG on low side reduce to 57U Restart farxiga '5mg'$   tomorrow to address daytime cbgs, titrate up as needed  3/1  increased faxiga to '10mg'$   3/5 control improved  10.  Hypertension.  Coreg 12.5 mg twice daily, HCTZ 25 mg daily.  Monitor with increased mobility Vitals:   05/21/22 2036 05/22/22 0514  BP: 138/79 (!) 158/82  Pulse: 62 69  Resp: 16 18  Temp: 97.7 F (36.5 C) 97.7 F (36.5 C)  SpO2: 100% 97%  Some lability will monitor prior to dosage change   - elevated this am  but overall improved cont to monitor on current meds   11.  Hyperlipidemia.  Crestor 12.  Urinary incontinence  will try to wean off condom cath   13.  Nausea and diarrhea x2 d, will check stool panel, change to clear liquid and use Zofran prn QT reviewed ok to use  May need IVF if unable to handle po LOS: 15 days A FACE TO FACE EVALUATION WAS PERFORMED  Charlett Blake 05/22/2022, 8:41 AM

## 2022-05-22 NOTE — Progress Notes (Signed)
Physical Therapy Session Note  Patient Details  Name: Joseph Ortega MRN: [id number] Date of Birth: 03/06/1956  Today's Date: 05/22/2022 PT Missed Time: 54 Minutes Missed Time Reason: Patient ill (Comment);Patient fatigue (GI upset)  Pt received sitting on EOB with nurse present for medication administration. Pt politely declines participation in therapy due to recent GI upset and fatigue. Missed 60 minutes of skilled physical therapy.   [name] , PT, DPT, NCS, CSRS 05/22/2022, 8:03 AM

## 2022-05-22 NOTE — Progress Notes (Signed)
Occupational Therapy Session Note  Patient Details  Name: Shamone Lucatero MRN: [id number] Date of Birth: 04-Jul-1955  Today's Date: 05/22/2022 OT Individual Time: [id number] OT Individual Time Calculation (min): 40 min    Short Term Goals: Week 2:  OT Short Term Goal 1 (Week 2): STG=LTG d/t Pt ELOS  Skilled Therapeutic Interventions/Progress Updates:     PPE donned prior to entering pt room as Pt is on enteric precautions with test results pending of neurovirus vs Cdiff vs. Flu. Pt received sitting EOB presenting to be extremely fatigued reporting 0/10 pain and receptive to skilled OT session. Pt requesting to use restroom during session. Sit>stand using RW min A d/t fatigue. Pt ambulated to bathroom with increased time provided and transferred to toilet CGA using RW. Provided Pt ++time on toilet d/t increased time needed for BM. Provided assistance for posterior peri care following BM d/t Pt fatigue. Pt stood to use urinal following with continent void. Pt able to bring pants to waist with min A to adjust back of pants. Pt ambulated back to room min A using RW and stood to wash hands at sink. Transferred to EOB using RW CGA. Remainder of session focused on increasing Pt moral by engaging Pt in light hearted conversation and providing therapeutic support with noted improvement in Pt moral following. Pt was left resting in bed with call bell in reach, bed alarm on, and all needs met.    Therapy Documentation Precautions:  Precautions Precautions: Fall Precaution Comments: L sided weakness; BP <150 Restrictions Weight Bearing Restrictions: No Other Position/Activity Restrictions: Needs Loop Monitor cardiac device wiht him in therapy General:   Vital Signs: Therapy Vitals Temp: 97.7 F (36.5 C) Pulse Rate: 69 Resp: 18 BP: (Abnormal) 158/82 Patient Position (if appropriate): Sitting Oxygen Therapy SpO2: 97 % O2 Device: Room Air Pain:   ADL: ADL Equipment Provided: Long-handled  sponge Eating: Set up Where Assessed-Eating: Bed level Grooming: Moderate assistance Where Assessed-Grooming: Sitting at sink Upper Body Bathing: Moderate assistance Where Assessed-Upper Body Bathing: Sitting at sink Lower Body Bathing: Maximal assistance Where Assessed-Lower Body Bathing: Sitting at sink, Standing at sink Upper Body Dressing: Moderate assistance Where Assessed-Upper Body Dressing: Sitting at sink Lower Body Dressing: Maximal assistance Where Assessed-Lower Body Dressing: Sitting at sink, Standing at sink Toileting: Minimal assistance Where Assessed-Toileting: Glass blower/designer: Moderate assistance Toilet Transfer Method: Counselling psychologist: Bedside commode, Energy manager: Maximal Firefighter Method: Radiographer, therapeutic: Radio broadcast assistant ADL Comments: min A amb with RW with limited L grasp- now with L walker splint, min A UB self care, mod a LB self care pull on clothing    Therapy/Group: Individual Therapy  Janey Genta 05/22/2022, 8:04 AM

## 2022-05-22 NOTE — Progress Notes (Signed)
Patient ID: Joseph Ortega, male   DOB: [birth year], 67 y.o.   MRN: [id number]  Sw has confirmed transportation with [facility]. Patient can continue arranging rides upon discharge at  [phone].  Patient received a RW and BSC in 2023. Patient does not want to continue with [employer] order. SW will cancel. No additional questions or concerns.

## 2022-05-22 NOTE — Progress Notes (Signed)
Patient ID: Joseph Ortega, male   DOB: 15-Nov-1955, 67 y.o.   MRN: [id number]  Patient not feeling well this AM. One emesis episode. Patient requesting to take 0800 medications at later time. Moved medications to 1000. Patient placed on Enteric precaution d/t testing GI, possible NoroVirus. Patient did not consume breakfast, still nauseated, Zofran given. Will continue to monitor patient.

## 2022-05-23 ENCOUNTER — Other Ambulatory Visit (HOSPITAL_COMMUNITY): Payer: Self-pay

## 2022-05-23 ENCOUNTER — Encounter: Payer: Self-pay | Admitting: Physical Therapy

## 2022-05-23 LAB — BASIC METABOLIC PANEL
Anion gap: 10 (ref 5–15)
BUN: 33 mg/dL — ABNORMAL HIGH (ref 8–23)
CO2: 26 mmol/L (ref 22–32)
Calcium: 9 mg/dL (ref 8.9–10.3)
Chloride: 101 mmol/L (ref 98–111)
Creatinine, Ser: 1.22 mg/dL (ref 0.61–1.24)
GFR, Estimated: >60 mL/min (ref >60)
Glucose, Bld: 114 mg/dL — ABNORMAL HIGH (ref 70–99)
Potassium: 4.1 mmol/L (ref 3.5–5.1)
Sodium: 137 mmol/L (ref 135–145)

## 2022-05-23 LAB — GLUCOSE, CAPILLARY
Glucose-Capillary: 142 mg/dL — ABNORMAL HIGH (ref 70–99)
Glucose-Capillary: 148 mg/dL — ABNORMAL HIGH (ref 70–99)
Glucose-Capillary: 213 mg/dL — ABNORMAL HIGH (ref 70–99)
Glucose-Capillary: 214 mg/dL — ABNORMAL HIGH (ref 70–99)

## 2022-05-23 LAB — GASTROINTESTINAL PANEL BY PCR, STOOL (REPLACES STOOL CULTURE)
Adenovirus F40/41: NOT DETECTED
Astrovirus: NOT DETECTED
Campylobacter species: NOT DETECTED
Cryptosporidium: NOT DETECTED
Cyclospora cayetanensis: NOT DETECTED
Entamoeba histolytica: NOT DETECTED
Enteroaggregative E coli (EAEC): NOT DETECTED
Enteropathogenic E coli (EPEC): NOT DETECTED
Enterotoxigenic E coli (ETEC): NOT DETECTED
Giardia lamblia: NOT DETECTED
Norovirus GI/GII: DETECTED — AB
Plesimonas shigelloides: NOT DETECTED
Rotavirus A: NOT DETECTED
Salmonella species: NOT DETECTED
Sapovirus (I, II, IV, and V): NOT DETECTED
Shiga like toxin producing E coli (STEC): NOT DETECTED
Shigella/Enteroinvasive E coli (EIEC): NOT DETECTED
Vibrio cholerae: NOT DETECTED
Vibrio species: NOT DETECTED
Yersinia enterocolitica: DETECTED — AB

## 2022-05-23 MED ORDER — LEVOTHYROXINE SODIUM 125 MCG PO TABS
125.0000 ug | ORAL_TABLET | Freq: Every day | ORAL | 0 refills | Status: DC
  Filled 2022-05-23: qty 30, 30d supply, fill #0

## 2022-05-23 MED ORDER — HYDROCHLOROTHIAZIDE 25 MG PO TABS
25.0000 mg | ORAL_TABLET | Freq: Every day | ORAL | 0 refills | Status: DC
  Filled 2022-05-23: qty 30, 30d supply, fill #0

## 2022-05-23 MED ORDER — HYDROCHLOROTHIAZIDE 12.5 MG PO TABS
12.5000 mg | ORAL_TABLET | Freq: Every day | ORAL | Status: DC
  Administered 2022-05-24 – 2022-05-29 (×6): 12.5 mg via ORAL
  Filled 2022-05-23 (×6): qty 1

## 2022-05-23 MED ORDER — DAPAGLIFLOZIN PROPANEDIOL 10 MG PO TABS
10.0000 mg | ORAL_TABLET | Freq: Every day | ORAL | 0 refills | Status: DC
  Filled 2022-05-23: qty 30, 30d supply, fill #0

## 2022-05-23 MED ORDER — SACCHAROMYCES BOULARDII 250 MG PO CAPS
250.0000 mg | ORAL_CAPSULE | Freq: Two times a day (BID) | ORAL | Status: DC
  Administered 2022-05-23 – 2022-05-29 (×13): 250 mg via ORAL
  Filled 2022-05-23 (×13): qty 1

## 2022-05-23 MED ORDER — CARVEDILOL 12.5 MG PO TABS
12.5000 mg | ORAL_TABLET | Freq: Two times a day (BID) | ORAL | 0 refills | Status: DC
  Filled 2022-05-23: qty 60, 30d supply, fill #0

## 2022-05-23 MED ORDER — ROSUVASTATIN CALCIUM 40 MG PO TABS
40.0000 mg | ORAL_TABLET | Freq: Every day | ORAL | 0 refills | Status: DC
  Filled 2022-05-23: qty 30, 30d supply, fill #0

## 2022-05-23 MED ORDER — PANTOPRAZOLE SODIUM 40 MG PO TBEC
40.0000 mg | DELAYED_RELEASE_TABLET | Freq: Every day | ORAL | 0 refills | Status: DC
  Filled 2022-05-23: qty 30, 30d supply, fill #0

## 2022-05-23 MED ORDER — DULOXETINE HCL 20 MG PO CPEP
20.0000 mg | ORAL_CAPSULE | Freq: Two times a day (BID) | ORAL | 0 refills | Status: DC
  Filled 2022-05-23: qty 60, 30d supply, fill #0

## 2022-05-23 MED ORDER — ACETAMINOPHEN 325 MG PO TABS: 650.0000 mg | ORAL_TABLET | ORAL | Status: DC | PRN

## 2022-05-23 MED ORDER — METHOCARBAMOL 500 MG PO TABS
500.0000 mg | ORAL_TABLET | Freq: Four times a day (QID) | ORAL | 0 refills | Status: DC | PRN
  Filled 2022-05-23: qty 30, 8d supply, fill #0

## 2022-05-23 MED ORDER — TRULICITY 3 MG/0.5ML ~~LOC~~ SOAJ
3.0000 mg | SUBCUTANEOUS | 0 refills | Status: DC
  Filled 2022-05-23: qty 2, 28d supply, fill #0

## 2022-05-23 MED ORDER — SODIUM CHLORIDE 0.45 % IV SOLN: INTRAVENOUS | Status: DC

## 2022-05-23 MED ORDER — HYDROCHLOROTHIAZIDE 12.5 MG PO TABS
12.5000 mg | ORAL_TABLET | Freq: Every day | ORAL | 0 refills | Status: DC
  Filled 2022-05-23: qty 30, 30d supply, fill #0

## 2022-05-23 MED ORDER — INSULIN DETEMIR 100 UNIT/ML FLEXPEN
50.0000 [IU] | PEN_INJECTOR | Freq: Every day | SUBCUTANEOUS | 11 refills | Status: DC
  Filled 2022-05-23: qty 15, 30d supply, fill #0

## 2022-05-23 MED ORDER — TOPIRAMATE 50 MG PO TABS
50.0000 mg | ORAL_TABLET | Freq: Every day | ORAL | 0 refills | Status: DC
  Filled 2022-05-23: qty 30, 30d supply, fill #0

## 2022-05-23 NOTE — Progress Notes (Signed)
PROGRESS NOTE   Subjective/Complaints:  No further emesis, 3 loose BMs in last 2day but no abd pain, + nausea and poor appetite  ROS: Patient denies CP, SOB,abd pain  Objective:   No results found. No results for input(s): "WBC", "HGB", "HCT", "PLT" in the last 72 hours.  Recent Labs    05/21/22 0551 05/23/22 0707  NA 139 137  K 3.2* 4.1  CL 105 101  CO2 23 26  GLUCOSE 93 114*  BUN 25* 33*  CREATININE 1.09 1.22  CALCIUM 8.9 9.0     Intake/Output Summary (Last 24 hours) at 05/23/2022 1023 Last data filed at 05/22/2022 1631 Gross per 24 hour  Intake --  Output 550 ml  Net -550 ml         Physical Exam: Vital Signs Blood pressure 135/65, pulse 62, temperature 98.6 F (37 C), resp. rate 16, height '6\' 1"'$  (1.854 m), weight 81.9 kg, SpO2 100 %.    General: No acute distress Mood and affect are appropriate Heart: Regular rate and rhythm no rubs murmurs or extra sounds Lungs: Clear to auscultation, breathing unlabored, no rales or wheezes Abdomen: Positive bowel sounds, soft nontender to palpation, nondistended Extremities: No clubbing, cyanosis, or edema Skin: No evidence of breakdown, no evidence of rash  Psych: pleasant and cooperative  Skin: No evidence of breakdown, no evidence of rash or erythema Neurologic: Cranial nerves II through XII intact, motor strength is 5/5 in right and 2+ to 3- left deltoid, bicep, tricep, grip, 5/5 right and 4-/5 left hip flexor, knee extensors, 1-1+ ankle.   Assessment/Plan: 1. Functional deficits which require 3+ hours per day of interdisciplinary therapy in a comprehensive inpatient rehab setting. Physiatrist is providing close team supervision and 24 hour management of active medical problems listed below. Physiatrist and rehab team continue to assess barriers to discharge/monitor patient progress toward functional and medical goals  Care Tool:  Bathing    Body parts  bathed by patient: Chest, Abdomen, Front perineal area, Right upper leg, Left upper leg, Face, Buttocks, Right lower leg, Left lower leg, Left arm   Body parts bathed by helper: Right arm, Buttocks, Left lower leg, Right lower leg     Bathing assist Assist Level: Supervision/Verbal cueing     Upper Body Dressing/Undressing Upper body dressing   What is the patient wearing?: Pull over shirt    Upper body assist Assist Level: Minimal Assistance - Patient > 75%    Lower Body Dressing/Undressing Lower body dressing      What is the patient wearing?: Underwear/pull up, Pants     Lower body assist Assist for lower body dressing: Contact Guard/Touching assist     Toileting Toileting    Toileting assist Assist for toileting: Contact Guard/Touching assist     Transfers Chair/bed transfer  Transfers assist     Chair/bed transfer assist level: Contact Guard/Touching assist Chair/bed transfer assistive device: Walker, Clinical biochemist   Ambulation assist      Assist level: Contact Guard/Touching assist Assistive device: Walker-rolling Max distance: 110f   Walk 10 feet activity   Assist     Assist level: Contact Guard/Touching assist Assistive device: Walker-rolling, Orthosis  Walk 50 feet activity   Assist    Assist level: Contact Guard/Touching assist Assistive device: Walker-rolling, Orthosis    Walk 150 feet activity   Assist    Assist level: Contact Guard/Touching assist Assistive device: Walker-rolling, Orthosis    Walk 10 feet on uneven surface  activity   Assist Walk 10 feet on uneven surfaces activity did not occur: Safety/medical concerns         Wheelchair     Assist Is the patient using a wheelchair?: Yes Type of Wheelchair: Manual    Wheelchair assist level: Maximal Assistance - Patient 25 - 49% Max wheelchair distance: 60 ft    Wheelchair 50 feet with 2 turns activity    Assist         Assist Level: Maximal Assistance - Patient 25 - 49%   Wheelchair 150 feet activity     Assist      Assist Level: Total Assistance - Patient < 25%   Blood pressure 135/65, pulse 62, temperature 98.6 F (37 C), resp. rate 16, height '6\' 1"'$  (1.854 m), weight 81.9 kg, SpO2 100 %.  Medical Problem List and Plan: 1. 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 Had a prior Left hemiparesis but has more weakness since most recent CVA              -patient may  shower             -ELOS/Goals: 05/24/2022- May f/u with Dr Naaman Plummer in office- may need to prolong stay due to gastroenteritis    -Continue CIR therapies including PT, OT   2.  Antithrombotics: -DVT/anticoagulation:  Pharmaceutical: Heparin- switch to lovenox , no renal disease              -antiplatelet therapy: Aspirin 81 mg daily resumed on 05/07/2022 3. Pain Management: Topamax 50 mg daily for headache 4. Mood/Behavior/Sleep: Provide emotional support             -antipsychotic agents: N/A 5. Neuropsych/cognition: This patient is capable of making decisions on his own behalf. 6. Skin/Wound Care: Routine skin checks 7. Fluids/Electrolytes/Nutrition: Routine in and outs with follow-up chemistries hypoK+ on HCTZ start daily KCL,     Latest Ref Rng & Units 05/23/2022    7:07 AM 05/21/2022    5:51 AM 05/14/2022    7:14 AM  BMP  Glucose 70 - 99 mg/dL 114  93  188   BUN 8 - 23 mg/dL 33  25  19   Creatinine 0.61 - 1.24 mg/dL 1.22  1.09  0.95   Sodium 135 - 145 mmol/L 137  139  137   Potassium 3.5 - 5.1 mmol/L 4.1  3.2  3.1   Chloride 98 - 111 mmol/L 101  105  101   CO2 22 - 32 mmol/L '26  23  28   '$ Calcium 8.9 - 10.3 mg/dL 9.0  8.9  9.0    Maintaining K+ on supplements despite diarrhea, BUN rising , give IVF slow rate  8.  Hypothyroidism.  Synthroid 9.  Diabetes mellitus.  Hemoglobin A1c 8.3.  Currently on SSI.  Patient was on Levemir 39 units twice daily prior to  admission as well as Farxiga 10 mg daily and Trulicity 3 mg every Thursday.  Resume as needed\ CBG (last 3)  Recent Labs    05/22/22 1652 05/22/22 2059 05/23/22 0616  GLUCAP 182* 185* 142*   Restart levimir, 15U qhs 2/20, increase to 25U on  2/21, increase to 35 U on 2/22, increased to 45 units on 2/24; 55 U 2/25,  Increase to 60U on 2/26 - am CBG on low side reduce to 57U Restart farxiga '5mg'$   tomorrow to address daytime cbgs, titrate up as needed  3/1  increased faxiga to '10mg'$   3/5 control improved  10.  Hypertension.  Coreg 12.5 mg twice daily, HCTZ 25 mg daily.  Monitor with increased mobility Vitals:   05/22/22 2102 05/23/22 0618  BP: 128/63 135/65  Pulse: 62 62  Resp: 15 16  Temp: 97.8 F (36.6 C) 98.6 F (37 C)  SpO2: 100% 100%  Some lability will monitor prior to dosage change   - controlled   11.  Hyperlipidemia.  Crestor 12.  Urinary incontinence  will try to wean off condom cath   13.  Nausea and diarrhea x2 d, will check stool panel, change to clear liquid and use Zofran prn QT reviewed ok to use  May need IVF if unable to handle po LOS: 16 days A FACE TO [place] E Mistey Hoffert 05/23/2022, 10:23 AM

## 2022-05-23 NOTE — Progress Notes (Signed)
Patient requested to lay back down after lunch. Appetite fair. Encouraged fluids.

## 2022-05-23 NOTE — Progress Notes (Signed)
Physical Therapy Session Note  Patient Details  Name: Antwann Weidert MRN: [id number] Date of Birth: 1955/05/13  Today's Date: 05/23/2022 PT Missed Time: 54 Minutes Missed Time Reason: MD hold (Comment);Patient ill (Comment) (norovirus)   Hold therapy at this time 2/2 NoroVirus. Pt missed 45 minutes of therapy.     Therapy/Group: Individual Therapy  Leena Tiede P Krysteena Stalker PT 05/23/2022, 7:43 AM

## 2022-05-23 NOTE — Progress Notes (Signed)
Patient ID: Joseph Ortega, male   DOB: 16-Jun-1955, 67 y.o.   MRN: [id number]  Team Conference Report to Patient/Family  Team Conference discussion was reviewed with the patient and caregiver, including goals, any changes in plan of care and target discharge date.  Patient and caregiver express understanding and are in agreement.  The patient has a target discharge date of 05/26/22 (D/C date extended).  SW spoke with patient and provided conference updates. Made attempt to call patient spouse. Patient aware and ok with extension. Patient will arrange transportation for Saturday and keep Sw posted on the timeframe for his transportation for d/c.  Dyanne Iha 05/23/2022, 1:23 PM

## 2022-05-23 NOTE — Progress Notes (Signed)
Had a quite night. No episode of emesis reported. Denies pin. Continues on CPAP nocte with 2L oxygen. 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 Enteric precautions maintained. Night Provider on call notified. Meticulous hand washing and high  touch surfaces disinfection continues. Patient educated as well. To continue care as planned.

## 2022-05-23 NOTE — Progress Notes (Addendum)
Inpatient Rehabilitation Discharge Medication Review by a Pharmacist  A complete drug regimen review was completed for this patient to identify any potential clinically significant medication issues.  High Risk Drug Classes Is patient taking? Indication by Medication  Antipsychotic No   Anticoagulant No   Antibiotic No   Opioid No   Antiplatelet Yes ASA - Stroke PPX  Hypoglycemics/insulin Yes Farxiga - 123456 Trulicity- 123456 Insulin - T2DM  Vasoactive Medication Yes Carvedilol - HTN HCTZ - HTN  Chemotherapy No   Other Yes Cymbalta - neuropathic pain Synthroid - Hypothyroidism Pantoprazole - GERD Potassium Chloride - hypoK ppx 2/2 HCTZ usage Crestor - HLD Topiramate - HA ppx Robaxin- muscle spasms     Type of Medication Issue Identified Description of Issue Recommendation(s)  Drug Interaction(s) (clinically significant)     Duplicate Therapy     Allergy     No Medication Administration End Date     Incorrect Dose     Additional Drug Therapy Needed     Significant med changes from prior encounter (inform family/care partners about these prior to discharge).    Other       Clinically significant medication issues were identified that warrant physician communication and completion of prescribed/recommended actions by midnight of the next day:  No  Name of provider notified for urgent issues identified:   Provider Method of Notification:     Pharmacist comments:   Time spent performing this drug regimen review (minutes):  30   Chesley Noon, PharmD Candidate 05/23/2022 7:42 AM

## 2022-05-23 NOTE — Patient Care Conference (Cosign Needed)
Inpatient RehabilitationTeam Conference and Plan of Care Update Date: 05/23/2022   Time: 10:47 AM    Patient Name: Joseph Ortega      Medical Record Number: [id number]  Date of Birth: [birth year] Sex: Male         Room/Bed: 4W06C/4W06C-01 Payor Info: Payor: Green / Plan: BCBS COMM PPO / Product Type: *No Product type* /    Admit Date/Time:  05/07/2022  8:36 PM  Primary Diagnosis:  Right middle cerebral artery stroke [facility])  Hospital Problems: Principal Problem:   Right middle cerebral artery stroke [facility])    Expected Discharge Date: Expected Discharge Date: 05/26/22 (D/C date extended)  Team Members Present: Physician leading conference: Dr. Alysia Penna Social Worker Present: Erlene Quan, BSW Nurse Present: Dorien Chihuahua, RN PT Present: Page Spiro, PT OT Present: Willeen Cass, OT SLP Present: Sherren Kerns, SLP PPS Coordinator present : Gunnar Fusi, SLP     Current Status/Progress Goal Weekly Team Focus  Bowel/Bladder   Pt Continent of B/B. LBM 05/22/22   Will maintain continence        Swallow/Nutrition/ Hydration               ADL's   Supervision to Mod I for all BADLs and home making IADLs using RW   Mod I overall   L NMR, balance training, activity tolerance, BADL retraining, IADL retraining, functional mobility training, d/c planning, compensatory strategies for L VFD    Mobility   CGA bed mobility using bed features, CGA/supervision sit<>stand and stand pivot transfers using RW, CGA/supervision gait up to 129f using RW, min assist stair navigation using HRs - pt's personal AFO delivered and need to trial - recent set-back medically due to positive GI infection   upgraded to mod-I household level due to pt's wife being the only support and she uses a wheelchair for mobility  L hemibody NMR, dynamic standing balance, activity tolerance, dynamic gait training, stair navigation, pt education, AFO consultation    Communication                 Safety/Cognition/ Behavioral Observations  sup-to-min A for problem solving, mod I memory using compensations   mod I-to-sup A   memory, problem solving and executive functioning with iADLs, education    Pain   Denies pain   Will remain pain free   Assess Q4 and prn    Skin   Skin grossly intact   Will maintain skin integrity  Assess Skin QS/prn      Discharge Planning:  Patient discharging home on Friday. Patient received a RW and BSC in 2023.   Team Discussion: Patient post right MCA CVA complicated by Norovirus and yersinia enterocolitica infx with gastroenteritis, dehydration.  Patient on target to meet rehab goals: yes  *See Care Plan and progress notes for long and short-term goals.   Revisions to Treatment Plan:  IVF and probiotics   Teaching Needs: Safety, medications, dietary modifications, transfers, etc.  Current Barriers to Discharge: Decreased caregiver support  Possible Resolutions to Barriers: Family education OP follow up services DME: RW     Medical Summary Current Status: + norovirus and yersinia enterocolitica infx with gastroenteritis, dehydration  Barriers to Discharge: Medical stability;Renal Insufficiency/Failure   Possible Resolutions to [employer] CorporationFocus: start IVF supportive care for gastroenteritis   Continued Need for Acute Rehabilitation Level of Care: The patient requires daily medical management by a physician with specialized training in physical medicine and rehabilitation for the following reasons: Direction of a  multidisciplinary physical rehabilitation program to maximize functional independence : Yes Medical management of patient stability for increased activity during participation in an intensive rehabilitation regime.: Yes Analysis of laboratory values and/or radiology reports with any subsequent need for medication adjustment and/or medical intervention. : Yes   I attest that I was present, lead the  team conference, and concur with the assessment and plan of the team.   Dorien Chihuahua B 05/23/2022, 3:33 PM

## 2022-05-23 NOTE — Therapy (Signed)
[facility] [address] [place], [place], [address] Phone: [phone]   Fax:  [phone]  Patient Details  Name: Joseph Ortega MRN: [id number] Date of Birth: 10-16-1955 Referring Provider:  No ref. provider found  Encounter Date: 05/23/2022  Pt d/c from OPPT services at this time as he has been admitted to the hospital. Pt will need a new referral to resume OPPT services upon d/c from hospital.  Excell Seltzer, PT, DPT, CSRS 05/23/2022, 8:31 AM  [facility] [address] [place], [place], [address] Phone: [phone]   Fax:  [phone]

## 2022-05-23 NOTE — Progress Notes (Signed)
Inpatient Rehabilitation Care Coordinator Discharge Note   Patient Details  Name: Joseph Ortega MRN: [id number] Date of Birth: Jan 14, 1956   Discharge location: Home with spouse (sup only)  Length of Stay: 22 Days  Discharge activity level: MOD I/Sup  Home/community participation: spouse  Patient response QK:MMNOTR Literacy - How often do you need to have someone help you when you read instructions, pamphlets, or other written material from your doctor or pharmacy?: Sometimes  Patient response RN:HAFBXU Isolation - How often do you feel lonely or isolated from those around you?: Never  Services provided included: Neuropsych, SW, Pharmacy, TR, CM, SLP, RN, OT, PT, RD, MD  Financial Services:  Financial Services Utilized: [place] offered to/list presented to: Patient  Follow-up services arranged:  Outpatient    Outpatient Servicies: Neuro Rehab PT OT SLP      Patient response to transportation need: Is the patient able to respond to transportation needs?: Yes In the past 12 months, has lack of transportation kept you from medical appointments or from getting medications?: No In the past 12 months, has lack of transportation kept you from meetings, work, or from getting things needed for daily living?: No    Comments (or additional information):  Patient/Family verbalized understanding of follow-up arrangements:  Yes  Individual responsible for coordination of the follow-up plan: self  Confirmed correct DME delivered: Dyanne Iha 05/23/2022    Dyanne Iha

## 2022-05-24 ENCOUNTER — Other Ambulatory Visit (HOSPITAL_COMMUNITY): Payer: Self-pay

## 2022-05-24 ENCOUNTER — Ambulatory Visit: Payer: BC Managed Care – PPO | Admitting: Physical Therapy

## 2022-05-24 LAB — GLUCOSE, CAPILLARY
Glucose-Capillary: 119 mg/dL — ABNORMAL HIGH (ref 70–99)
Glucose-Capillary: 154 mg/dL — ABNORMAL HIGH (ref 70–99)
Glucose-Capillary: 170 mg/dL — ABNORMAL HIGH (ref 70–99)
Glucose-Capillary: 193 mg/dL — ABNORMAL HIGH (ref 70–99)

## 2022-05-24 NOTE — Progress Notes (Signed)
Physical Therapy Session Note  Patient Details  Name: Joseph Ortega MRN: [id number] Date of Birth: 1956/01/03  Today's Date: 05/24/2022 PT Individual Time: 0916-0940 PT Individual Time Calculation (min): 24 min  and Today's Date: 05/24/2022 PT Missed Time: 20 Minutes Missed Time Reason: Pain;Patient ill (Comment) (pt with norovirus, stomach pains and dry heaving with mobility)  Short Term Goals: Week 1:  PT Short Term Goal 1 (Week 1): Pt will perform sit<>stand with CGA using RW. PT Short Term Goal 1 - Progress (Week 1): Met PT Short Term Goal 2 (Week 1): pt will perform stand pivot to/ from recliner with CGA/ MinA using RW PT Short Term Goal 2 - Progress (Week 1): Met PT Short Term Goal 3 (Week 1): Pt will perform dynamic standing/ balance activities with no AD and MinA. PT Short Term Goal 3 - Progress (Week 1): Met PT Short Term Goal 4 (Week 1): pt will ambulate longer distances using RW and improved LLE foot clearance. PT Short Term Goal 4 - Progress (Week 1): Met PT Short Term Goal 5 (Week 1): Pt will demonstrate decreased hip hike during backward step with LLE. PT Short Term Goal 5 - Progress (Week 1): Met  Skilled Therapeutic Interventions/Progress Updates:  Patient received resting in bed, reports lower left abdominal discomfort but eager to attempt therapy today.  Pt does not rate discomfort with number. Pt required Mod assist to don pant legs in bed, pt raising Le's for therapist to place loop of leg on Rt then Lt LE. Pt able to reach down and pull pants up to thighs in supine. Unable to bridge today due to pain in abdomen. Cues for pt to roll Lt/Rt to attempt pulling shorts up fully over one hip at a time. Pt using bed rails to roll and Max assist to pull shorts over Rt hip. HOB elevated and pt using bed rail to attempt raising trunk up after walking bil LE's to EOB. Pt required min assist to fully raise trunk upright to sit at edge. Upon sitting pt groaning in discomfort and belching  gas. After ~2 minutes sitting EOB pt began to dry heave. Phone call occurred for medicare application and pt attempting to scoot at EOB to reposition for feet on floor to increase stability but unable. Max assist with bed pad to scoot hips posteriorly at EOB in preparation to return to supine. Min assist to bring LE's onto bed due to Lt lower abdominal pain. EOS pt resting in supine HOB at 45*, alarm on, and call bell and all needs within reach. RN notified of pt's symptoms and request for nausea and meds to address abdominal pain in preparation for future therapy session today.   Therapy Documentation Precautions:  Precautions Precautions: Fall Precaution Comments: L sided weakness; BP <150 Restrictions Weight Bearing Restrictions: No Other Position/Activity Restrictions: Needs Loop Monitor cardiac device wiht him in therapy   Therapy/Group: Individual Therapy   Verner Mould, DPT Acute Rehabilitation Services Office [phone]  05/24/22 10:32 AM

## 2022-05-24 NOTE — Progress Notes (Addendum)
PROGRESS NOTE   Subjective/Complaints:  2 liquid stools yesterday but no emesis, remains nauseated with poor appetite, refused OT due to stomach upset , intake poor   ROS: Patient denies CP, SOB,abd pain  Objective:   No results found. No results for input(s): "WBC", "HGB", "HCT", "PLT" in the last 72 hours.  Recent Labs    05/23/22 0707  NA 137  K 4.1  CL 101  CO2 26  GLUCOSE 114*  BUN 33*  CREATININE 1.22  CALCIUM 9.0     Intake/Output Summary (Last 24 hours) at 05/24/2022 0930 Last data filed at 05/24/2022 0725 Gross per 24 hour  Intake 472 ml  Output 550 ml  Net -78 ml         Physical Exam: Vital Signs Blood pressure 131/69, pulse (!) 58, temperature 97.9 F (36.6 C), temperature source Oral, resp. rate 17, height 6\' 1"  (1.854 m), weight 81.9 kg, SpO2 99 %.    General: No acute distress Mood and affect are appropriate Heart: Regular rate and rhythm no rubs murmurs or extra sounds Lungs: Clear to auscultation, breathing unlabored, no rales or wheezes Abdomen: Positive bowel sounds, soft nontender to palpation, nondistended Extremities: No clubbing, cyanosis, or edema Skin: No evidence of breakdown, no evidence of rash  Psych: pleasant and cooperative  Skin: No evidence of breakdown, no evidence of rash or erythema Neurologic: Cranial nerves II through XII intact, motor strength is 5/5 in right and 2+ to 3- left deltoid, bicep, tricep, grip, 5/5 right and 4-/5 left hip flexor, knee extensors, 1-1+ ankle.   Assessment/Plan: 1. Functional deficits which require 3+ hours per day of interdisciplinary therapy in a comprehensive inpatient rehab setting. Physiatrist is providing close team supervision and 24 hour management of active medical problems listed below. Physiatrist and rehab team continue to assess barriers to discharge/monitor patient progress toward functional and medical goals  Care  Tool:  Bathing    Body parts bathed by patient: Chest, Abdomen, Front perineal area, Right upper leg, Left upper leg, Face, Buttocks, Right lower leg, Left lower leg, Left arm   Body parts bathed by helper: Right arm, Buttocks, Left lower leg, Right lower leg     Bathing assist Assist Level: Supervision/Verbal cueing     Upper Body Dressing/Undressing Upper body dressing   What is the patient wearing?: Pull over shirt    Upper body assist Assist Level: Minimal Assistance - Patient > 75%    Lower Body Dressing/Undressing Lower body dressing      What is the patient wearing?: Underwear/pull up, Pants     Lower body assist Assist for lower body dressing: Contact Guard/Touching assist     Toileting Toileting    Toileting assist Assist for toileting: Contact Guard/Touching assist     Transfers Chair/bed transfer  Transfers assist     Chair/bed transfer assist level: Contact Guard/Touching assist Chair/bed transfer assistive device: Walker, Clinical biochemist   Ambulation assist      Assist level: Contact Guard/Touching assist Assistive device: Walker-rolling Max distance: 112ft   Walk 10 feet activity   Assist     Assist level: Contact Guard/Touching assist Assistive device: Walker-rolling, Orthosis  Walk 50 feet activity   Assist    Assist level: Contact Guard/Touching assist Assistive device: Walker-rolling, Orthosis    Walk 150 feet activity   Assist    Assist level: Contact Guard/Touching assist Assistive device: Walker-rolling, Orthosis    Walk 10 feet on uneven surface  activity   Assist Walk 10 feet on uneven surfaces activity did not occur: Safety/medical concerns         Wheelchair     Assist Is the patient using a wheelchair?: Yes Type of Wheelchair: Manual    Wheelchair assist level: Maximal Assistance - Patient 25 - 49% Max wheelchair distance: 60 ft    Wheelchair 50 feet with 2 turns  activity    Assist        Assist Level: Maximal Assistance - Patient 25 - 49%   Wheelchair 150 feet activity     Assist      Assist Level: Total Assistance - Patient < 25%   Blood pressure 131/69, pulse (!) 58, temperature 97.9 F (36.6 C), temperature source Oral, resp. rate 17, height 6\' 1"  (1.854 m), weight 81.9 kg, SpO2 99 %.  Medical Problem List and Plan: 1. 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 Had a prior Left hemiparesis but has more weakness since most recent CVA              -patient may  shower             -ELOS/Goals: 05/26/2022- May f/u with Dr Naaman Plummer in office- may need to prolong stay due to gastroenteritis    -Continue CIR therapies including PT, OT - if pt has decline in fxn may need to extend stay until 3/12  2.  Antithrombotics: -DVT/anticoagulation:  Pharmaceutical: Heparin- switch to lovenox , no renal disease              -antiplatelet therapy: Aspirin 81 mg daily resumed on 05/07/2022 3. Pain Management: Topamax 50 mg daily for headache 4. Mood/Behavior/Sleep: Provide emotional support             -antipsychotic agents: N/A 5. Neuropsych/cognition: This patient is capable of making decisions on his own behalf. 6. Skin/Wound Care: Routine skin checks 7. Fluids/Electrolytes/Nutrition: Routine in and outs with follow-up chemistries hypoK+ on HCTZ start daily KCL,     Latest Ref Rng & Units 05/23/2022    7:07 AM 05/21/2022    5:51 AM 05/14/2022    7:14 AM  BMP  Glucose 70 - 99 mg/dL 114  93  188   BUN 8 - 23 mg/dL 33  25  19   Creatinine 0.61 - 1.24 mg/dL 1.22  1.09  0.95   Sodium 135 - 145 mmol/L 137  139  137   Potassium 3.5 - 5.1 mmol/L 4.1  3.2  3.1   Chloride 98 - 111 mmol/L 101  105  101   CO2 22 - 32 mmol/L 26  23  28    Calcium 8.9 - 10.3 mg/dL 9.0  8.9  9.0    Maintaining K+ on supplements despite diarrhea, BUN rising , give IVF slow rate  8.  Hypothyroidism.   Synthroid 9.  Diabetes mellitus.  Hemoglobin A1c 8.3.  Currently on SSI.  Patient was on Levemir 39 units twice daily prior to admission as well as Farxiga 10 mg daily and Trulicity 3 mg every Thursday.  Resume as needed\ CBG (last 3)  Recent Labs    05/23/22 1631 05/23/22 2105 05/24/22  0610  GLUCAP 213* 214* 119*   AM CBG ok on Levimir 50U qhs Restart farxiga 5mg   tomorrow to address daytime cbgs, titrate up as needed  3/1  increased faxiga to 10mg   3/8 am control is good, CBGs variable during the day depending on intake using 6-10U novalog for SSI per day will start mealtime coverage 3U TID ac  10.  Hypertension.  Coreg 12.5 mg twice daily, HCTZ 25 mg daily.  Monitor with increased mobility Vitals:   05/23/22 2112 05/24/22 0453  BP: (!) 148/65 131/69  Pulse: 65 (!) 58  Resp: 15 17  Temp:  97.9 F (36.6 C)  SpO2:  99%  Reduced HCTZ to 12.5mg  3/7-monitor   - controlled 3/7  11.  Hyperlipidemia.  Crestor 12.  Urinary incontinence  will try to wean off condom cath   13.Norovirus- diarrhea subsiding still with nausea but no vomiting , meal intake is good but fluid intake is low , enc Zofran use prior to therapy  Cont IVF Recheck BMET 3/8 LOS: 17 days A FACE TO [facility] E Leia Coletti 05/24/2022, 9:30 AM

## 2022-05-24 NOTE — Progress Notes (Signed)
Occupational Therapy Note  Patient Details  Name: Joseph Ortega MRN: [id number] Date of Birth: 18-Dec-1955  Today's Date: 05/24/2022 OT Missed Time: 14 Minutes Missed Time Reason: Patient fatigue;Pain  Pt greeted supine in bed reporting not feeling well d/t an upset stomach. Offered staying in bed and only working on LUE however pt reports discomfort even with LUE AROM. Offered ADLS or bed level therex with pt politely declining, will continue efforts as time allows to make up for missed minutes.     Corinne Ports [facility] 05/24/2022, 3:11 PM

## 2022-05-24 NOTE — Progress Notes (Signed)
Slept well last night. Denies pain. No episodes of vomiting reported. Tolerating diet well. Disinfecting surfaces per protocol done. Patient educated regarding hygiene as well. Safety ensured.

## 2022-05-24 NOTE — Progress Notes (Signed)
Speech Language Pathology Discharge Summary  Patient Details  Name: Kylor Valverde MRN: [id number] Date of Birth: October 01, 1955  Date of Discharge from SLP service:March {NUMBERS 1-31 (DATE):31396}, 2024  {chl ip rehab slp time calculations:304100500}   Skilled Therapeutic Interventions:  ***   Patient has met 1 of 2 long term goals.  Patient to discharge at overall Supervision;Min level.   Reasons goals not met: N/A  Clinical Impression/Discharge Summary: Patient has made functional gains and has met 2 of 2 long-term goals this admission due to improved cognitive-linguistic skills. Patient is currently completing functional and complex cognitive tasks with min A cues in regards to complex problem solving and executive functioning; mod I for use of memory compensations. Recommend pt have supervision with iADL tasks such as medication and financial management. Patient education is complete and patient to discharge at overall sup-to-min A level. Patient's care partner is independent to provide the necessary physical and cognitive assistance at discharge. Patient would benefit from continued SLP services in outpatient setting to maximize cognitive-linguistic function and functional independence.   Care Partner:  Caregiver Able to Provide Assistance: Yes  Type of Caregiver Assistance: Cognitive;Physical  Recommendation:  Outpatient SLP  Rationale for SLP Follow Up: Maximize cognitive function and independence   Equipment: None   Reasons for discharge: Discharged from hospital   Patient/Family Agrees with Progress Made and Goals Achieved: Yes    Brianne T Garretson 05/24/2022, 2:18 PM

## 2022-05-24 NOTE — Progress Notes (Addendum)
Physical Therapy Discharge Summary  Patient Details  Name: Joseph Ortega MRN: [id number] Date of Birth: Sep 04, 1955  Date of Discharge from PT service:May 28, 2022   Patient has met 7 of 8 long term goals due to improved activity tolerance, improved balance, improved postural control, increased strength, ability to compensate for deficits, functional use of  left upper extremity and left lower extremity, improved attention, improved awareness, and improved coordination.  Patient to discharge at an ambulatory level  supervision to modified independent  using RW and L AFO.  Patient's care partner is independent to provide the necessary  supervision  assistance at discharge.  Reasons goals not met: Stair goal set at supervision but pt requires CGA/min assist for stairs at this time with rails. Pt with length of stay extended due to GI issues causing him to miss several days of therapies and present with overall decreased strength and endurance. Pt still fluctuating levels with overall decreased endurance but significantly improved overall towards his baseline.   Recommendation:  Patient will benefit from ongoing skilled PT services in outpatient setting to continue to advance safe functional mobility, address ongoing impairments in L hemiparesis, dynamic standing balance, dynamic gait training using LRAD, and minimize fall risk.  Equipment: No equipment provided, pt has all necessary DME   Reasons for discharge: treatment goals met and discharge from hospital  Patient/family agrees with progress made and goals achieved: Yes  PT Discharge Precautions/Restrictions Precautions Precautions: Fall;Other (comment) Precaution Comments: L hemiparesis, need to keep loop recorder phone within bluetooth range of patient at all times Restrictions Weight Bearing Restrictions: No Pain  Still having abdominal pain intermittently due to GI related issues. Rest breaks as needed provided throughout session.     Pain Interference Pain Interference Pain Effect on Sleep: 2. Occasionally Pain Interference with Therapy Activities: 2. Occasionally Pain Interference with Day-to-Day Activities: 2. Occasionally Vision/Perception  Vision - History Ability to See in Adequate Light: 0 Adequate Perception Perception: Within Functional Limits Praxis Praxis: Intact  Cognition  See ST d/c summary for full cognitive details Sensation Sensation Light Touch: Appears Intact Hot/Cold: Not tested Proprioception: Appears Intact Stereognosis: Not tested Coordination Gross Motor Movements are Fluid and Coordinated: No Coordination and Movement Description: continues to have L hemiparesis impacting coordination of movements but improving and WFL for household level tasks using compensatory strategies as needed Motor  Motor Motor: Hemiplegia;Abnormal tone;Abnormal postural alignment and control Motor - Discharge Observations: continues to have L hemiparesis  Mobility Bed Mobility Bed Mobility: Supine to Sit;Sit to Supine (using bed features) Supine to Sit: Independent with assistive device Sit to Supine: Independent with assistive device Transfers Transfers: Sit to Stand;Stand to Sit;Stand Pivot Transfers Sit to Stand: Independent with assistive device Stand to Sit: Independent with assistive device Stand Pivot transfers: modified independent to supervision with RW and AFO; extra time and occasional cues for safety Transfer (Assistive device): Rolling walker Locomotion  Gait Ambulation: Yes Gait Assistance: Supervision/Verbal cueing;Independent with assistive device (mod-I household level and supervision for longer distances) Gait Distance (Feet): 150 Feet Assistive device: Rolling walker Gait Gait: Yes Gait Pattern: Impaired Gait Pattern: Step-through pattern;Decreased step length - left;Decreased hip/knee flexion - left (improvement in gait mechanics with custom AFO) Gait velocity:  decreased Stairs / Additional Locomotion Stairs: Yes Stairs Assistance: Contact Guard/Touching assist Stair Management Technique: Two rails;Step to pattern Number of Stairs: 4 Height of Stairs: 6 Ramp: Contact Guard/touching assist (using RW)  Trunk/Postural Assessment  Cervical Assessment Cervical Assessment: Exceptions to [facility] (slight forward head) Thoracic  Assessment Thoracic Assessment: Exceptions to [facility] (rounded shoulders) Lumbar Assessment Lumbar Assessment: Exceptions to [facility] (flexible posterior pelvic tilt in sitting) Postural Control Postural Control: Within Functional Limits (WFL using B UE support on RW for basic household level tasks)  Balance Balance Balance Assessed: Yes Static Sitting Balance Static Sitting - Balance Support: Feet supported Static Sitting - Level of Assistance: 6: Modified independent (Device/Increase time) Dynamic Sitting Balance Dynamic Sitting - Balance Support: Feet supported Dynamic Sitting - Level of Assistance: 6: Modified independent (Device/Increase time) Static Standing Balance Static Standing - Balance Support: During functional activity;Bilateral upper extremity supported Static Standing - Level of Assistance: 6: Modified independent (Device/Increase time) Dynamic Standing Balance Dynamic Standing - Balance Support: During functional activity;Bilateral upper extremity supported Dynamic Standing - Level of Assistance: 6: Modified independent (Device/Increase time);Other (comment) (supervision) Extremity Assessment      RLE Assessment RLE Assessment: Within Functional Limits Active Range of Motion (AROM) Comments: WFL RLE Strength Right Hip Flexion: 4+/5 Right Hip Extension: 4+/5 Right Hip ABduction: 4+/5 Right Knee Extension: 5/5 Right Ankle Dorsiflexion: 4/5 Right Ankle Plantar Flexion: 4/5 LLE Assessment LLE Assessment: Exceptions to [facility] LLE Strength Left Hip Flexion: 4-/5 Left Knee Flexion: 3+/5 Left Knee Extension:  4-/5 Left Ankle Dorsiflexion: 3-/5 Left Ankle Plantar Flexion: 3-/5   Joseph Ortega , PT, DPT, NCS, CSRS 05/24/2022, 12:43 PM  Joseph Masson, PT, DPT, CBIS 05/28/22

## 2022-05-24 NOTE — Progress Notes (Signed)
Inpatient Rehabilitation Discharge Medication Review by a Pharmacist  A complete drug regimen review was completed for this patient to identify any potential clinically significant medication issues.  High Risk Drug Classes Is patient taking? Indication by Medication  Antipsychotic No   Anticoagulant No   Antibiotic No   Opioid No   Antiplatelet Yes ASA - Stroke PPX  Hypoglycemics/insulin Yes Farxiga - 123456 Trulicity- 123456 Insulin - T2DM  Vasoactive Medication Yes Carvedilol - HTN HCTZ - HTN  Chemotherapy No   Other Yes Cymbalta - neuropathic pain Synthroid - Hypothyroidism Pantoprazole - GERD Crestor - HLD Topiramate - HA ppx Robaxin- muscle spasms     Type of Medication Issue Identified Description of Issue Recommendation(s)  Drug Interaction(s) (clinically significant)     Duplicate Therapy     Allergy     No Medication Administration End Date     Incorrect Dose     Additional Drug Therapy Needed     Significant med changes from prior encounter (inform family/care partners about these prior to discharge).    Other       Clinically significant medication issues were identified that warrant physician communication and completion of prescribed/recommended actions by midnight of the next day:  No   Time spent performing this drug regimen review (minutes):  30   Cree Kunert BS, PharmD, BCPS Clinical Pharmacist 05/24/2022 6:05 AM  Contact: [phone] after 3 PM  "Be curious, not judgmental..." -Jamal Maes

## 2022-05-24 NOTE — Progress Notes (Signed)
SLP Cancellation Note  Patient Details Name: Joseph Ortega MRN: [id number] DOB: 08-31-1955   Cancelled treatment: Pt missed 60 minutes of skilled SLP intervention due to upset stomach, nausea, and fatigue. Pt declined any needs at this time.  SLP to reattempt as schedule allows.                                                      Deleah Tison T Carvin Almas 05/24/2022, 2:10 PM

## 2022-05-24 NOTE — Progress Notes (Signed)
Physical Therapy Session Note  Patient Details  Name: Joseph Ortega MRN: [id number] Date of Birth: 13-Apr-1955  Today's Date: 05/24/2022 PT Individual Time: 1105-1208 PT Individual Time Calculation (min): 63 min   Short Term Goals: Week 2:  PT Short Term Goal 1 (Week 2): = to LTGs based on ELOS  Skilled Therapeutic Interventions/Progress Updates:    Pt received supported upright in bed awake and reports he is feeling "sore and sick:" Pt eager to want to participate in therapy session; however, reports when he made it to the EOB with earlier PT he started dry heaving and could not progress out of the bed. Pt reports he did receive nausea and gas medication prior to this therapy session. NT in/out for blood sugar. Pt agreeable to attempt session per his tolerance.  Supine>sitting R EOB, HOB maximally elevated and using bedrail, with 2x attempts due to pt having sudden onset sharp L lower quadrant abdominal pain, able to complete with supervision and increased time on 2nd attempt. Discussed pt's utilization of the bed features and him not having a hospital bed at home - pt reports he feels he can get out of his home bed and that if not, he feels comfortable having his grandson assist him.  Sitting EOB, donned tennis shoe and L LE PLS AFO with increased time, but pt able to get AFO on without help!! Pt very excited by this.  Sit<>stand slightly elevated EOB<>RW with close supervision for safety.   Gait training in room using RW with close supervision for safety and pt reporting "man this brace feels good" stating it even supports his ankle more - pt demos improved foot clearance and heel strike on initial contact.  Gait training to/from bathroom using RW with close supervision for safety. Standing with L UE support on RW pt able to perform LB clothing management with increased time and supervision for balance safety, but pt does well adjusting AD and foot placement to improve his safety with balance  without cuing. Continent of BM and performed standing pericare with set-up assist. Standing hand hygiene at sink with distant supervision.  Pt able to doff L LE AFO independently as well - educated pt on ensuring the AFO is firmly in his shoe, importance of wearing tall socks, and performing daily, thorough skin assessments. Pt verbalized understanding of education.   Pt confirms he is still comfortable with plan for follow-up OPPT. Pt does express concern regarding D/Cing home on Saturday due to the GI illness and pt still feeling very weak - will notify the team.  Pt left seated EOB with nurse present to assume care.    Therapy Documentation Precautions:  Precautions Precautions: Fall Precaution Comments: L sided weakness; BP <150 Restrictions Weight Bearing Restrictions: No Other Position/Activity Restrictions: Needs Loop Monitor cardiac device wiht him in therapy   Pain:  L lower quadrant abdominal pain with spikes in pain frequently throughout session with pt grimacing 1x requiring return to supine for pain management.     Therapy/Group: Individual Therapy  [name] , PT, DPT, NCS, CSRS 05/24/2022, 7:52 AM

## 2022-05-25 LAB — BASIC METABOLIC PANEL
Anion gap: 11 (ref 5–15)
BUN: 18 mg/dL (ref 8–23)
CO2: 25 mmol/L (ref 22–32)
Calcium: 9.2 mg/dL (ref 8.9–10.3)
Chloride: 101 mmol/L (ref 98–111)
Creatinine, Ser: 0.94 mg/dL (ref 0.61–1.24)
GFR, Estimated: >60 mL/min (ref >60)
Glucose, Bld: 161 mg/dL — ABNORMAL HIGH (ref 70–99)
Potassium: 3.5 mmol/L (ref 3.5–5.1)
Sodium: 137 mmol/L (ref 135–145)

## 2022-05-25 LAB — GLUCOSE, CAPILLARY
Glucose-Capillary: 76 mg/dL (ref 70–99)
Glucose-Capillary: 148 mg/dL — ABNORMAL HIGH (ref 70–99)
Glucose-Capillary: 151 mg/dL — ABNORMAL HIGH (ref 70–99)
Glucose-Capillary: 176 mg/dL — ABNORMAL HIGH (ref 70–99)

## 2022-05-25 MED ORDER — INSULIN ASPART 100 UNIT/ML IJ SOLN
3.0000 [IU] | Freq: Three times a day (TID) | INTRAMUSCULAR | Status: DC
  Administered 2022-05-25 – 2022-05-27 (×5): 3 [IU] via SUBCUTANEOUS

## 2022-05-25 NOTE — Progress Notes (Signed)
Speech Language Pathology Weekly Progress and Session Note  Patient Details  Name: Joseph Ortega MRN: [id number] Date of Birth: [birth year]  Beginning of progress report period: May 15, 2022 End of progress report period: May 25, 2022  Short Term Goals: Week 2: SLP Short Term Goal 1 (Week 2): Patient will recall and use memory compensations with sup A verbal cues for effectiveness SLP Short Term Goal 1 - Progress (Week 2): Met SLP Short Term Goal 2 (Week 2): Patient will complete complex problem solving with iADL tasks including medication and/or money management with sup A verbal cues SLP Short Term Goal 2 - Progress (Week 2): Not met SLP Short Term Goal 3 (Week 2): Patient will complete scheduling and/or organization tasks with sup A verbal cues SLP Short Term Goal 3 - Progress (Week 2): Not met  New Short Term Goals: Week 3: SLP Short Term Goal 1 (Week 3): STG=LTG due to ELOS  Weekly Progress Updates: Patient has made slow gains and has met 1 of 3 STGs this reporting period. Participation has been limited recently secondary to GI illness. Discharge date was consequently extended. Patient is currently completing cognitive tasks with overall min A verbal cues in regards to complex problem solving and executive functions. Pt using compensatory memory aids with mod I-to-sup A. Long-term goal for complex problem solving was downgraded to min A level due to slower than anticipated progress in this area. Pt remains highly motivated to participate. Patient and family education is ongoing. Patient would benefit from continued skilled SLP intervention to maximize cognitive functioning and overall functional independence prior to discharge.     Intensity: Minumum of 1-2 x/day, 30 to 90 minutes Frequency: 3 to 5 out of 7 days Duration/Length of Stay: 3/12 (extended due to medical needs). Treatment/Interventions: Cognitive remediation/compensation;Functional tasks;Patient/family  education;Therapeutic Activities   Patty Sermons 05/25/2022, 5:56 PM

## 2022-05-25 NOTE — Plan of Care (Signed)
  Problem: Consults Goal: RH STROKE PATIENT EDUCATION Description: See Patient Education module for education specifics  Outcome: Progressing   Problem: RH SAFETY Goal: RH STG ADHERE TO SAFETY PRECAUTIONS W/ASSISTANCE/DEVICE Description: STG Adhere to Safety Precautions With cues Assistance/Device. Outcome: Progressing   Problem: RH PAIN MANAGEMENT Goal: RH STG PAIN MANAGED AT OR BELOW PT'S PAIN GOAL Description: < 4 with prns Outcome: Progressing   Problem: RH KNOWLEDGE DEFICIT Goal: RH STG INCREASE KNOWLEDGE OF DIABETES Description: Patient and Spouse will be able to manage DM with medications and dietary modifications using educational resources independently Outcome: Progressing Goal: RH STG INCREASE KNOWLEDGE OF HYPERTENSION Description: Patient and S.O. will be able to manage HTN with medications and dietary modifications using educational resources independently Outcome: Progressing Goal: RH STG INCREASE KNOWLEGDE OF HYPERLIPIDEMIA Description: Patient and S.O. 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 will be able to manage secondary risks with medications and dietary modifications using educational resources independently Outcome: Progressing   Problem: Education: Goal: Knowledge of disease or condition will improve Outcome: Progressing Goal: Knowledge of secondary prevention will improve (MUST DOCUMENT ALL) Outcome: Progressing Goal: Knowledge of patient specific risk factors will improve (Mark N/A or DELETE if not current risk factor) Outcome: Progressing   Problem: Intracerebral Hemorrhage Tissue Perfusion: Goal: Complications of Intracerebral Hemorrhage will be minimized Outcome: Progressing   Problem: Coping: Goal: Will verbalize positive feelings about self Outcome: Progressing Goal:  Will identify appropriate support needs Outcome: Progressing   Problem: Health Behavior/Discharge Planning: Goal: Ability to manage health-related needs will improve Outcome: Progressing Goal: Goals will be collaboratively established with patient/family Outcome: Progressing   Problem: Self-Care: Goal: Ability to participate in self-care as condition permits will improve Outcome: Progressing Goal: Verbalization of feelings and concerns over difficulty with self-care will improve Outcome: Progressing Goal: Ability to communicate needs accurately will improve Outcome: Progressing   Problem: Nutrition: Goal: Risk of aspiration will decrease Outcome: Progressing Goal: Dietary intake will improve Outcome: Progressing   

## 2022-05-25 NOTE — Progress Notes (Signed)
Occupational Therapy Weekly Progress Note  Patient Details  Name: Joseph Ortega MRN: [id number] Date of Birth: 12-06-1955  Beginning of progress report period: May 16, 2022 End of progress report period: May 25, 2022  Today's Date: 05/25/2022 OT Individual Time: 1417-1530 OT Individual Time Calculation (min): 73 min  OT Individual Time: 1417-1530 OT Individual Time Calculation (min): 73 min   Patient has met 0 of 1 short term goals d/t STG not written based on Pt ELOS. Pt stay extended d/t weakness and fatigue 2/2 norovirus impacting Pt independence, BADL participation, and safety. Pt presenting with increased motivation and independence in BADLs today, however continuing to demonstrate some fatigue. Pt currently requiring supervision for U/LB bathing and dressing, grooming/hygiene tasks, and toileting. CGA required for functional transfers <> toilet or BSC using RW 2/2 weakness. Pt able to don/doff new AFO supervision and is demonstrating increased use of his LUE at a non-dominant level.   Patient continues to demonstrate the following deficits: muscle weakness and muscle joint tightness, decreased cardiorespiratoy endurance, impaired timing and sequencing, abnormal tone, unbalanced muscle activation, motor apraxia, decreased coordination, and decreased motor planning, decreased visual motor skills and field cut, decreased attention to left, decreased problem solving, decreased safety awareness, decreased memory, and delayed processing, and decreased sitting balance, decreased standing balance, hemiplegia, and decreased balance strategies and therefore will continue to benefit from skilled OT intervention to enhance overall performance with BADL, iADL, and Reduce care partner burden.  Patient progressing toward long term goals..  Continue plan of care.  OT Short Term Goals Week 1:  OT Short Term Goal 1 (Week 1): Complete UB self care with S using hemi techniques OT Short Term Goal 1 -  Progress (Week 1): Met OT Short Term Goal 2 (Week 1): Complete LB self care using hemi techniques with CGA OT Short Term Goal 2 - Progress (Week 1): Met OT Short Term Goal 3 (Week 1): Amb for toilet transfer with RW with CGA OT Short Term Goal 3 - Progress (Week 1): Met OT Short Term Goal 4 (Week 1): Pt will stand for oral care or kitchen sink task for up to 5 min with CGA OT Short Term Goal 4 - Progress (Week 1): Met Week 2:  OT Short Term Goal 1 (Week 2): STG=LTG d/t Pt ELOS  Skilled Therapeutic Interventions/Progress Updates:     AM Session: Pt received sitting up in wc presenting to be in good spirits and receptive to skilled OT session. Pt reporting fatigue, however motivated to participate in therapy. Provided Pt therapeutic support and motivation at beginning of session and engaged Pt in light hearted conversation to support improved moral and motivation for session. Pt able to don shoes and L AFO with supervision +time. Pt requesting to walk to gym at beginning of session. Pt completed wc mobility to propel self outside room using BLEs and RUE with supervision for endurance and strength training. Pt sit>stand min A using RW ambulated to therapy gym ~100 ft with close supervision to CGA at end d/t fatigue with rest break provided following. Discussed energy conservation strategies with Pt with emphasis on taking brakes, pacing activities, and prioritizing activities. Pt ambulated back to room using RW CGA ~100 ft using RW. Remainder of session focused on completing LUE NMR with AAROM functional reaching activities including the following:  -Flexion/extension -Shoulder circumduction -Supination/pronation -Shoulder add/abduction Decreased pain and increased ROM noted following LUE NMR exercises.  Pt left resting EOB with nursing staff informed at end of session with  all needs met.   PM Session: Pt received sitting up EOB presenting to be in good spirits and receptive to skilled OT session. Pt  requesting to take shower during this session. Pt ambulated within room to gather clothing items with RW supervision. Pt ambulated to shower and transferred to tub bench with CGA using grab bars with CGA for safety d/t pt safety. Encouraged pt to utilize LUE during bathing re-educating Pt on providing self HOH A with pt using LUE at gross assist level during task. Facilitated implementing energy pacing during task with Pt communicating need to take breaks and allowing increased time for task to increase safety and practice in preparation for d/c. Pt able to communicate need for ret break x2 during task without cues required. Pt ambulated to room and completed U/LB dressing following at supervision level using hemi-dressing techniques. Pt returned to bed supervision and was left resting in bed with call bell in reach, bed alarm on, and all needs met.    Therapy Documentation Precautions:  Precautions Precautions: Fall, Other (comment) Precaution Comments: L hemiparesis, need to keep loop recorder phone within bluetooth range of patient at all times Restrictions Weight Bearing Restrictions: No Other Position/Activity Restrictions: Needs Loop Monitor cardiac device wiht him in therapy General:   Vital Signs:  Pain:   ADL: ADL Equipment Provided: Long-handled sponge Eating: Set up Where Assessed-Eating: Bed level Grooming: Minimal assistance Where Assessed-Grooming: Sitting at sink Upper Body Bathing: Supervision/safety Where Assessed-Upper Body Bathing: Shower Lower Body Bathing: Supervision/safety Where Assessed-Lower Body Bathing: Shower Upper Body Dressing: Supervision/safety Where Assessed-Upper Body Dressing: Edge of bed Lower Body Dressing: Supervision/safety Where Assessed-Lower Body Dressing: Edge of bed Toileting: Supervision/safety Where Assessed-Toileting: Glass blower/designer: Therapist, music Method: Counselling psychologist: Bedside commode,  Energy manager: Curator Method: Heritage manager: Radio broadcast assistant, Grab bars, Shower seat with back ADL Comments: min A amb with RW with limited L grasp- now with L walker splint, min A UB self care, mod a LB self care pull on clothing   Therapy/Group: Individual Therapy  Janey Genta 05/25/2022, 3:38 PM

## 2022-05-25 NOTE — Progress Notes (Signed)
Patient ID: Joseph Ortega, male   DOB: 22-Sep-1955, 67 y.o.   MRN: [id number]  Per Dan-PA pt discharge will be extended until 3/12 due to medical issues. Will let staff know

## 2022-05-25 NOTE — Progress Notes (Signed)
Inpatient Rehabilitation Discharge Medication Review by a Pharmacist  A complete drug regimen review was completed for this patient to identify any potential clinically significant medication issues.  High Risk Drug Classes Is patient taking? Indication by Medication  Antipsychotic No   Anticoagulant No   Antibiotic No   Opioid No   Antiplatelet Yes ASA - Stroke PPX  Hypoglycemics/insulin Yes Farxiga - 123456 Trulicity- 123456 Insulin - T2DM  Vasoactive Medication Yes Carvedilol - HTN HCTZ - HTN  Chemotherapy No   Other Yes Cymbalta - neuropathic pain Synthroid - Hypothyroidism Pantoprazole - GERD Crestor - HLD Topiramate - HA ppx Robaxin- muscle spasms     Type of Medication Issue Identified Description of Issue Recommendation(s)  Drug Interaction(s) (clinically significant)     Duplicate Therapy     Allergy     No Medication Administration End Date     Incorrect Dose     Additional Drug Therapy Needed     Significant med changes from prior encounter (inform family/care partners about these prior to discharge).    Other       Clinically significant medication issues were identified that warrant physician communication and completion of prescribed/recommended actions by midnight of the next day:  No   Time spent performing this drug regimen review (minutes):  30   Joseph Ortega BS, PharmD, BCPS Clinical Pharmacist 05/25/2022 6:52 AM  Contact: [phone] after 3 PM  "Be curious, not judgmental..." -Jamal Maes

## 2022-05-25 NOTE — Plan of Care (Signed)
Goal downgraded due to slower than anticipated progress Problem: RH Problem Solving Goal: LTG Patient will demonstrate problem solving for (SLP) Description: LTG:  Patient will demonstrate problem solving for basic/complex daily situations with cues  (SLP) Flowsheets (Taken 05/25/2022 1239) LTG: Patient will demonstrate problem solving for (SLP): Complex daily situations LTG Patient will demonstrate problem solving for: Minimal Assistance - Patient > 75%

## 2022-05-25 NOTE — Progress Notes (Signed)
PROGRESS NOTE   Subjective/Complaints:  2 liquid stools yesterday but no emesis, remains nauseated with poor appetite, refused OT due to stomach upset , intake poor   ROS: Patient denies CP, SOB,abd pain  Objective:   No results found. No results for input(s): "WBC", "HGB", "HCT", "PLT" in the last 72 hours.  Recent Labs    05/23/22 0707  NA 137  K 4.1  CL 101  CO2 26  GLUCOSE 114*  BUN 33*  CREATININE 1.22  CALCIUM 9.0     Intake/Output Summary (Last 24 hours) at 05/25/2022 0903 Last data filed at 05/25/2022 0810 Gross per 24 hour  Intake 709.5 ml  Output 1200 ml  Net -490.5 ml         Physical Exam: Vital Signs Blood pressure 123/66, pulse (!) 55, temperature 97.8 F (36.6 C), temperature source Oral, resp. rate 16, height '6\' 1"'$  (1.854 m), weight 81.9 kg, SpO2 100 %.    General: No acute distress Mood and affect are appropriate Heart: Regular rate and rhythm no rubs murmurs or extra sounds Lungs: Clear to auscultation, breathing unlabored, no rales or wheezes Abdomen: Positive bowel sounds, soft nontender to palpation, nondistended Extremities: No clubbing, cyanosis, or edema Skin: No evidence of breakdown, no evidence of rash  Psych: pleasant and cooperative  Skin: No evidence of breakdown, no evidence of rash or erythema Neurologic: Cranial nerves II through XII intact, motor strength is 5/5 in right and 2+ to 3- left deltoid, bicep, tricep, grip, 5/5 right and 4-/5 left hip flexor, knee extensors, 1-1+ ankle.   Assessment/Plan: 1. Functional deficits which require 3+ hours per day of interdisciplinary therapy in a comprehensive inpatient rehab setting. Physiatrist is providing close team supervision and 24 hour management of active medical problems listed below. Physiatrist and rehab team continue to assess barriers to discharge/monitor patient progress toward functional and medical goals  Care  Tool:  Bathing    Body parts bathed by patient: Chest, Abdomen, Front perineal area, Right upper leg, Left upper leg, Face, Buttocks, Right lower leg, Left lower leg, Left arm   Body parts bathed by helper: Right arm, Buttocks, Left lower leg, Right lower leg     Bathing assist Assist Level: Supervision/Verbal cueing     Upper Body Dressing/Undressing Upper body dressing   What is the patient wearing?: Pull over shirt    Upper body assist Assist Level: Minimal Assistance - Patient > 75%    Lower Body Dressing/Undressing Lower body dressing      What is the patient wearing?: Underwear/pull up, Pants     Lower body assist Assist for lower body dressing: Contact Guard/Touching assist     Toileting Toileting    Toileting assist Assist for toileting: Contact Guard/Touching assist     Transfers Chair/bed transfer  Transfers assist     Chair/bed transfer assist level: Contact Guard/Touching assist Chair/bed transfer assistive device: Walker, Clinical biochemist   Ambulation assist      Assist level: Contact Guard/Touching assist Assistive device: Walker-rolling Max distance: 120f   Walk 10 feet activity   Assist     Assist level: Contact Guard/Touching assist Assistive device: Walker-rolling, Orthosis  Walk 50 feet activity   Assist    Assist level: Contact Guard/Touching assist Assistive device: Walker-rolling, Orthosis    Walk 150 feet activity   Assist    Assist level: Contact Guard/Touching assist Assistive device: Walker-rolling, Orthosis    Walk 10 feet on uneven surface  activity   Assist Walk 10 feet on uneven surfaces activity did not occur: Safety/medical concerns         Wheelchair     Assist Is the patient using a wheelchair?: Yes Type of Wheelchair: Manual    Wheelchair assist level: Maximal Assistance - Patient 25 - 49% Max wheelchair distance: 60 ft    Wheelchair 50 feet with 2 turns  activity    Assist        Assist Level: Maximal Assistance - Patient 25 - 49%   Wheelchair 150 feet activity     Assist      Assist Level: Total Assistance - Patient < 25%   Blood pressure 123/66, pulse (!) 55, temperature 97.8 F (36.6 C), temperature source Oral, resp. rate 16, height '6\' 1"'$  (1.854 m), weight 81.9 kg, SpO2 100 %.  Medical Problem List and Plan: 1. 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 Had a prior Left hemiparesis but has more weakness since most recent CVA              -patient may  shower             -ELOS/Goals: was to d/c at [place] but now at [facility] - May f/u with Dr Naaman Plummer in office-    -Continue CIR therapies including PT, OT - pt has decline in fxn need to extend stay until 3/12  2.  Antithrombotics: -DVT/anticoagulation:  Pharmaceutical: Heparin- switch to lovenox , no renal disease              -antiplatelet therapy: Aspirin 81 mg daily resumed on 05/07/2022 3. Pain Management: Topamax 50 mg daily for headache 4. Mood/Behavior/Sleep: Provide emotional support             -antipsychotic agents: N/A 5. Neuropsych/cognition: This patient is capable of making decisions on his own behalf. 6. Skin/Wound Care: Routine skin checks 7. Fluids/Electrolytes/Nutrition: Routine in and outs with follow-up chemistries hypoK+ on HCTZ start daily KCL,     Latest Ref Rng & Units 05/23/2022    7:07 AM 05/21/2022    5:51 AM 05/14/2022    7:14 AM  BMP  Glucose 70 - 99 mg/dL 114  93  188   BUN 8 - 23 mg/dL 33  25  19   Creatinine 0.61 - 1.24 mg/dL 1.22  1.09  0.95   Sodium 135 - 145 mmol/L 137  139  137   Potassium 3.5 - 5.1 mmol/L 4.1  3.2  3.1   Chloride 98 - 111 mmol/L 101  105  101   CO2 22 - 32 mmol/L '26  23  28   '$ Calcium 8.9 - 10.3 mg/dL 9.0  8.9  9.0    Maintaining K+ on supplements despite diarrhea, BUN rising , give IVF slow rate  8.  Hypothyroidism.  Synthroid 9.  Diabetes  mellitus.  Hemoglobin A1c 8.3.  Currently on SSI.  Patient was on Levemir 39 units twice daily prior to admission as well as Farxiga 10 mg daily and Trulicity 3 mg every Thursday.  Resume as needed\ CBG (last 3)  Recent Labs    05/24/22 1631 05/24/22 2207 05/25/22  0609  GLUCAP 170* 193* 76   AM CBG ok on Levimir 50U qhs Restart farxiga '5mg'$   tomorrow to address daytime cbgs, titrate up as needed  3/1  increased faxiga to '10mg'$   3/8 am control is good, CBGs variable during the day depending on intake using 6-10U novalog for SSI per day will start mealtime coverage 3U TID ac  10.  Hypertension.  Coreg 12.5 mg twice daily, HCTZ 25 mg daily.  Monitor with increased mobility Vitals:   05/25/22 0613 05/25/22 0813  BP: 136/72 123/66  Pulse: (!) 54 (!) 55  Resp: 16   Temp: 97.8 F (36.6 C)   SpO2: 100%   Reduced HCTZ to 12.'5mg'$  3/7-monitor   - controlled 3/8  11.  Hyperlipidemia.  Crestor 12.  Urinary incontinence  will try to wean off condom cath   13.Norovirus- diarrhea subsiding still with nausea but no vomiting , meal intake is good but fluid intake is low , enc Zofran use prior to therapy  Cont IVF Recheck BMET 3/8 LOS: 18 days A FACE TO [place] [facility] Ailine Hefferan 05/25/2022, 9:03 AM

## 2022-05-25 NOTE — TOC Transition Note (Signed)
Discharge medications (7) are being stored in the main pharmacy on the ground floor until patient is ready for discharge.   

## 2022-05-25 NOTE — Progress Notes (Signed)
Physical Therapy Session Note  Patient Details  Name: Joseph Ortega MRN: [id number] Date of Birth: [birth year]  Today's Date: 05/25/2022 PT Individual Time: 0822-0915 PT Individual Time Calculation (min): 53 min   Short Term Goals: Week 2:  PT Short Term Goal 1 (Week 2): = to LTGs based on ELOS   Skilled Therapeutic Interventions/Progress Updates:    Pt presents in the bed reporting feeling overall a little better than the last few days but very fatigued and pain still an issue. Pt would benefit from longer LOS as pt now requiring increased assistance and decreased overall endurance. Pt requires extra time for all tasks and increased work of breathing with all activity with rest breaks provided due to low endurance. Light min assist for bed mobility to come to EOB with use of rails for support today. Donned clothing EOB with significant amount of extra time due to rest breaks needed due to fatigue but pt able to perform lower body dressing (pants, socks, and shoes with AFO) at supervision level. Required min assist for sit <> stands with RW from slightly elevated surface with facilitation for anterior weightshift. CGA to min assist for dynamic standing balance to pull up pants with RW for support. Min assist to transfer with RW to w/c for balance and safety. Focused on gait in hallway for overall endurance, strengthening and functional mobility training with CGA to min assist overall and slow cadence due to fatigue (HR = 57 bpm and O2 = 100% when checked after gait) x 100'. Pt with extended rest break but felt unable to attempt again due to fatigue. Returned to room with w/c total assist and set up with all needs in reach. Encouraged sitting up OOB to promote increased overall endurance and activity tolerance. Pt in agreement.   Therapy Documentation Precautions:  Precautions Precautions: Fall, Other (comment) Precaution Comments: L hemiparesis, need to keep loop recorder phone within bluetooth range  of patient at all times Restrictions Weight Bearing Restrictions: No Other Position/Activity Restrictions: Needs Loop Monitor cardiac device wiht him in therapy General: PT Amount of Missed Time (min): 7 Minutes PT Missed Treatment Reason: Patient fatigue Vital Signs: HR = 65 bpm O2 = 98%  Increased work of breathing with activity  Pain:  C/o pain in L lower abdomen from GI issues - rest breaks as needed and reports taking gas medication earlier   Therapy/Group: Individual Therapy  Canary Brim Ivory Broad, PT, DPT, CBIS  05/25/2022, 9:28 AM

## 2022-05-26 DIAGNOSIS — A0811 Acute gastroenteropathy due to Norwalk agent: Secondary | ICD-10-CM

## 2022-05-26 LAB — GLUCOSE, CAPILLARY
Glucose-Capillary: 86 mg/dL (ref 70–99)
Glucose-Capillary: 175 mg/dL — ABNORMAL HIGH (ref 70–99)
Glucose-Capillary: 149 mg/dL — ABNORMAL HIGH (ref 70–99)
Glucose-Capillary: 176 mg/dL — ABNORMAL HIGH (ref 70–99)

## 2022-05-26 NOTE — Progress Notes (Signed)
Physical Therapy Weekly Progress Note  Patient Details  Name: Joseph Ortega MRN: [id number] Date of Birth: 1955/08/07  Beginning of progress report period: May 16, 2022 End of progress report period: May 26, 2022   Patient has met 0 of 1 short term goals due to none being set based on ELOS at last progress note. [name] LOS has been extended due to GI illness resulting in pt feeling overall weaker and resultantly requiring increased physical assistance to perform functional mobility tasks. He is now performing supine<>sit with varying supervision<>min assist, sit<>stand and stand pivot transfers using RW with varying supervision to min assist, and ambulating up to 161f using RW with supervision to min assist. He will benefit from continued CIR level skilled physical therapy to return his mobility level to supervision/mod-I level in preparation for D/C.   Patient continues to demonstrate the following deficits muscle weakness and muscle joint tightness, decreased cardiorespiratoy endurance, impaired timing and sequencing, abnormal tone, unbalanced muscle activation, and decreased coordination, and decreased standing balance, decreased postural control, hemiplegia, and decreased balance strategies and therefore will continue to benefit from skilled PT intervention to increase functional independence with mobility.  Patient progressing toward long term goals..  Continue plan of care.  PT Short Term Goals Week 2:  PT Short Term Goal 1 (Week 2): = to LTGs based on ELOS PT Short Term Goal 1 - Progress (Week 2): Progressing toward goal Week 3:  PT Short Term Goal 1 (Week 3): = to LTGs based on ELOS  Skilled Therapeutic Interventions/Progress Updates:  Ambulation/gait training;Community reintegration;DME/adaptive equipment instruction;Neuromuscular re-education;Psychosocial support;Stair training;UE/LE Strength taining/ROM;Wheelchair propulsion/positioning;UE/LE Coordination  activities;Therapeutic Activities;Skin care/wound management;Pain management;Discharge planning;Balance/vestibular training;Cognitive remediation/compensation;Disease management/prevention;Functional mobility training;Patient/family education;Splinting/orthotics;Therapeutic Exercise;Visual/perceptual remediation/compensation;Functional electrical stimulation   Therapy Documentation Precautions:  Precautions Precautions: Fall, Other (comment) Precaution Comments: L hemiparesis, need to keep loop recorder phone within bluetooth range of patient at all times Restrictions Weight Bearing Restrictions: No Other Position/Activity Restrictions: Needs Loop Monitor cardiac device wiht him in therapy   CTawana Scale, PT, DPT, NCS, CSRS 05/26/2022, 6:44 PM

## 2022-05-26 NOTE — Progress Notes (Signed)
PROGRESS NOTE   Subjective/Complaints:  Last liquid stool on 3/7 at 1154. No stools since actually. Pt has been eating.   ROS: Patient denies fever, rash, sore throat, blurred vision, dizziness, nausea, vomiting,   cough, shortness of breath or chest pain, joint or back/neck pain, headache, or mood change.   Objective:   No results found. No results for input(s): "WBC", "HGB", "HCT", "PLT" in the last 72 hours.  Recent Labs    05/25/22 1104  NA 137  K 3.5  CL 101  CO2 25  GLUCOSE 161*  BUN 18  CREATININE 0.94  CALCIUM 9.2    Intake/Output Summary (Last 24 hours) at 05/26/2022 1026 Last data filed at 05/26/2022 0609 Gross per 24 hour  Intake 474 ml  Output 1825 ml  Net -1351 ml        Physical Exam: Vital Signs Blood pressure (!) 155/74, pulse (!) 50, temperature 97.8 F (36.6 C), temperature source Oral, resp. rate 18, height '6\' 1"'$  (1.854 m), weight 81.9 kg, SpO2 100 %.    Constitutional: No distress . Vital signs reviewed. HEENT: NCAT, EOMI, oral membranes moist Neck: supple Cardiovascular: RRR without murmur. No JVD    Respiratory/Chest: CTA Bilaterally without wheezes or rales. Normal effort    GI/Abdomen: BS +, non-tender, non-distended Ext: no clubbing, cyanosis, or edema Psych: pleasant and cooperative  Skin: No evidence of breakdown, no evidence of rash Neurologic: Cranial nerves II through XII intact, motor strength is 5/5 in right and   3- left deltoid, bicep, tricep, grip, 5/5 right and 4-/5 left hip flexor, knee extensors, 1+ ankle.   Assessment/Plan: 1. Functional deficits which require 3+ hours per day of interdisciplinary therapy in a comprehensive inpatient rehab setting. Physiatrist is providing close team supervision and 24 hour management of active medical problems listed below. Physiatrist and rehab team continue to assess barriers to discharge/monitor patient progress toward functional  and medical goals  Care Tool:  Bathing    Body parts bathed by patient: Chest, Abdomen, Front perineal area, Right upper leg, Left upper leg, Face, Buttocks, Right lower leg, Left lower leg, Left arm   Body parts bathed by helper: Right arm, Buttocks, Left lower leg, Right lower leg     Bathing assist Assist Level: Supervision/Verbal cueing     Upper Body Dressing/Undressing Upper body dressing   What is the patient wearing?: Pull over shirt    Upper body assist Assist Level: Minimal Assistance - Patient > 75%    Lower Body Dressing/Undressing Lower body dressing      What is the patient wearing?: Underwear/pull up, Pants     Lower body assist Assist for lower body dressing: Contact Guard/Touching assist     Toileting Toileting    Toileting assist Assist for toileting: Contact Guard/Touching assist     Transfers Chair/bed transfer  Transfers assist     Chair/bed transfer assist level: Minimal Assistance - Patient > 75% Chair/bed transfer assistive device: Walker, Armrests, Orthosis   Locomotion Ambulation   Ambulation assist      Assist level: Minimal Assistance - Patient > 75% Assistive device: Walker-rolling Max distance: 179f   Walk 10 feet activity   Assist  Assist level: Contact Guard/Touching assist Assistive device: Walker-rolling, Orthosis   Walk 50 feet activity   Assist    Assist level: Minimal Assistance - Patient > 75% Assistive device: Walker-rolling, Orthosis    Walk 150 feet activity   Assist    Assist level: Contact Guard/Touching assist Assistive device: Walker-rolling, Orthosis    Walk 10 feet on uneven surface  activity   Assist Walk 10 feet on uneven surfaces activity did not occur: Safety/medical concerns         Wheelchair     Assist Is the patient using a wheelchair?: Yes Type of Wheelchair: Manual    Wheelchair assist level: Maximal Assistance - Patient 25 - 49% Max wheelchair distance:  60 ft    Wheelchair 50 feet with 2 turns activity    Assist        Assist Level: Maximal Assistance - Patient 25 - 49%   Wheelchair 150 feet activity     Assist      Assist Level: Total Assistance - Patient < 25%   Blood pressure (!) 155/74, pulse (!) 50, temperature 97.8 F (36.6 C), temperature source Oral, resp. rate 18, height '6\' 1"'$  (1.854 m), weight 81.9 kg, SpO2 100 %.  Medical Problem List and Plan: 1. 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 Had a prior Left hemiparesis but has more weakness since most recent CVA              -patient may  shower             -ELOS/Goals: was to d/c at [place] but now at [facility] - May f/u with Dr Naaman Plummer in office-    --Continue CIR therapies including PT, OT  - pt has had decline in fxn needed to extend stay until 3/12  2.  Antithrombotics: -DVT/anticoagulation:  Pharmaceutical: Heparin- switch to lovenox , no renal disease              -antiplatelet therapy: Aspirin 81 mg daily resumed on 05/07/2022 3. Pain Management: Topamax 50 mg daily for headache 4. Mood/Behavior/Sleep: Provide emotional support             -antipsychotic agents: N/A 5. Neuropsych/cognition: This patient is capable of making decisions on his own behalf. 6. Skin/Wound Care: Routine skin checks 7. Fluids/Electrolytes/Nutrition: Routine in and outs with follow-up chemistries hypoK+ on HCTZ start daily KCL,     Latest Ref Rng & Units 05/25/2022   11:04 AM 05/23/2022    7:07 AM 05/21/2022    5:51 AM  BMP  Glucose 70 - 99 mg/dL 161  114  93   BUN 8 - 23 mg/dL 18  33  25   Creatinine 0.61 - 1.24 mg/dL 0.94  1.22  1.09   Sodium 135 - 145 mmol/L 137  137  139   Potassium 3.5 - 5.1 mmol/L 3.5  4.1  3.2   Chloride 98 - 111 mmol/L 101  101  105   CO2 22 - 32 mmol/L '25  26  23   '$ Calcium 8.9 - 10.3 mg/dL 9.2  9.0  8.9    Maintaining K+ on supplements despite diarrhea  -BUN better with IVF  HS  -stooling less  -stop IVF and recheck labs monday  8.  Hypothyroidism.  Synthroid 9.  Diabetes mellitus.  Hemoglobin A1c 8.3.  Currently on SSI.  Patient was on Levemir 39 units twice daily prior to admission as well as Farxiga 10 mg  daily and Trulicity 3 mg every Thursday.  Resume as needed\ CBG (last 3)  Recent Labs    05/25/22 1615 05/25/22 2107 05/26/22 0559  GLUCAP 151* 176* 86  AM CBG ok on Levimir 50U qhs Restart farxiga '5mg'$   tomorrow to address daytime cbgs, titrate up as needed  3/1  increased faxiga to '10mg'$   3/8 am control is good, CBGs variable during the day depending on intake using 6-10U novolog for SSI per day will start mealtime coverage 3U TID ac 3/9 fair control--obsv with  changes made yesterday  10.  Hypertension.  Coreg 12.5 mg twice daily, HCTZ 25 mg daily.  Monitor with increased mobility Vitals:   05/26/22 0411 05/26/22 0749  BP: (!) 155/74   Pulse: (!) 52 (!) 50  Resp: 18   Temp: 97.8 F (36.6 C)   SpO2: 100%   Reduced HCTZ to 12.'5mg'$  3/7-monitor   - fair control 3/9  11.  Hyperlipidemia.  Crestor 12.  Urinary incontinence  will try to wean off condom cath  13.Norovirus- diarrhea subsiding   meal intake is good  -dc IVF  LOS: 19 days A FACE TO FACE EVALUATION WAS PERFORMED  Meredith Staggers 05/26/2022, 10:26 AM

## 2022-05-27 LAB — GLUCOSE, CAPILLARY
Glucose-Capillary: 127 mg/dL — ABNORMAL HIGH (ref 70–99)
Glucose-Capillary: 148 mg/dL — ABNORMAL HIGH (ref 70–99)
Glucose-Capillary: 113 mg/dL — ABNORMAL HIGH (ref 70–99)
Glucose-Capillary: 161 mg/dL — ABNORMAL HIGH (ref 70–99)

## 2022-05-27 MED ORDER — INSULIN ASPART 100 UNIT/ML IJ SOLN
4.0000 [IU] | Freq: Three times a day (TID) | INTRAMUSCULAR | Status: DC
  Administered 2022-05-27 – 2022-05-29 (×5): 4 [IU] via SUBCUTANEOUS

## 2022-05-27 NOTE — Progress Notes (Addendum)
PROGRESS NOTE   Subjective/Complaints: Had one large liquid stool last night associated with LLQ discomfort/spasm. Otherwise did well. Eating 100%  ROS: Patient denies fever, rash, sore throat, blurred vision, dizziness, nausea, vomiting,   cough, shortness of breath or chest pain, joint or back/neck pain, headache, or mood change.     Objective:   No results found. No results for input(s): "WBC", "HGB", "HCT", "PLT" in the last 72 hours.  Recent Labs    05/25/22 1104  NA 137  K 3.5  CL 101  CO2 25  GLUCOSE 161*  BUN 18  CREATININE 0.94  CALCIUM 9.2    Intake/Output Summary (Last 24 hours) at 05/27/2022 0925 Last data filed at 05/27/2022 0810 Gross per 24 hour  Intake 1196 ml  Output 2450 ml  Net -1254 ml        Physical Exam: Vital Signs Blood pressure (!) 167/76, pulse 63, temperature 98.1 F (36.7 C), temperature source Oral, resp. rate 18, height '6\' 1"'$  (1.854 m), weight 81.9 kg, SpO2 100 %.    Constitutional: No distress . Vital signs reviewed. HEENT: NCAT, EOMI, oral membranes moist Neck: supple Cardiovascular: RRR without murmur. No JVD    Respiratory/Chest: CTA Bilaterally without wheezes or rales. Normal effort    GI/Abdomen: BS +, TTP LLQ, non-distended Ext: no clubbing, cyanosis, or edema Psych: pleasant and cooperative   Skin: No evidence of breakdown, no evidence of rash Neurologic: Cranial nerves II through XII intact, motor strength is 5/5 in right and   3- left deltoid, bicep, tricep, grip, 5/5 right and 4-/5 left hip flexor, knee extensors, 1+ ankle.   Assessment/Plan: 1. Functional deficits which require 3+ hours per day of interdisciplinary therapy in a comprehensive inpatient rehab setting. Physiatrist is providing close team supervision and 24 hour management of active medical problems listed below. Physiatrist and rehab team continue to assess barriers to discharge/monitor patient  progress toward functional and medical goals  Care Tool:  Bathing    Body parts bathed by patient: Chest, Abdomen, Front perineal area, Right upper leg, Left upper leg, Face, Buttocks, Right lower leg, Left lower leg, Left arm   Body parts bathed by helper: Right arm, Buttocks, Left lower leg, Right lower leg     Bathing assist Assist Level: Supervision/Verbal cueing     Upper Body Dressing/Undressing Upper body dressing   What is the patient wearing?: Pull over shirt    Upper body assist Assist Level: Minimal Assistance - Patient > 75%    Lower Body Dressing/Undressing Lower body dressing      What is the patient wearing?: Underwear/pull up, Pants     Lower body assist Assist for lower body dressing: Contact Guard/Touching assist     Toileting Toileting    Toileting assist Assist for toileting: Contact Guard/Touching assist     Transfers Chair/bed transfer  Transfers assist     Chair/bed transfer assist level: Minimal Assistance - Patient > 75% Chair/bed transfer assistive device: Walker, Armrests, Orthosis   Locomotion Ambulation   Ambulation assist      Assist level: Minimal Assistance - Patient > 75% Assistive device: Walker-rolling Max distance: 172f   Walk 10 feet activity  Assist     Assist level: Contact Guard/Touching assist Assistive device: Walker-rolling, Orthosis   Walk 50 feet activity   Assist    Assist level: Minimal Assistance - Patient > 75% Assistive device: Walker-rolling, Orthosis    Walk 150 feet activity   Assist    Assist level: Contact Guard/Touching assist Assistive device: Walker-rolling, Orthosis    Walk 10 feet on uneven surface  activity   Assist Walk 10 feet on uneven surfaces activity did not occur: Safety/medical concerns         Wheelchair     Assist Is the patient using a wheelchair?: Yes Type of Wheelchair: Manual    Wheelchair assist level: Maximal Assistance - Patient 25 -  49% Max wheelchair distance: 60 ft    Wheelchair 50 feet with 2 turns activity    Assist        Assist Level: Maximal Assistance - Patient 25 - 49%   Wheelchair 150 feet activity     Assist      Assist Level: Total Assistance - Patient < 25%   Blood pressure (!) 167/76, pulse 63, temperature 98.1 F (36.7 C), temperature source Oral, resp. rate 18, height '6\' 1"'$  (1.854 m), weight 81.9 kg, SpO2 100 %.  Medical Problem List and Plan: 1. 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 Had a prior Left hemiparesis but has more weakness since most recent CVA              -patient may  shower             -ELOS/Goals: was to d/c at [place] but now at [facility] - May f/u with Dr Naaman Plummer in office-    --Continue CIR therapies including PT, OT  - pt has had decline in fxn needed to extend stay until 3/12  2.  Antithrombotics: -DVT/anticoagulation:  Pharmaceutical: Heparin- switch to lovenox , no renal disease              -antiplatelet therapy: Aspirin 81 mg daily resumed on 05/07/2022 3. Pain Management: Topamax 50 mg daily for headache 4. Mood/Behavior/Sleep: Provide emotional support             -antipsychotic agents: N/A 5. Neuropsych/cognition: This patient is capable of making decisions on his own behalf. 6. Skin/Wound Care: Routine skin checks 7. Fluids/Electrolytes/Nutrition: Routine in and outs with follow-up chemistries hypoK+ on HCTZ start daily KCL,     Latest Ref Rng & Units 05/25/2022   11:04 AM 05/23/2022    7:07 AM 05/21/2022    5:51 AM  BMP  Glucose 70 - 99 mg/dL 161  114  93   BUN 8 - 23 mg/dL 18  33  25   Creatinine 0.61 - 1.24 mg/dL 0.94  1.22  1.09   Sodium 135 - 145 mmol/L 137  137  139   Potassium 3.5 - 5.1 mmol/L 3.5  4.1  3.2   Chloride 98 - 111 mmol/L 101  101  105   CO2 22 - 32 mmol/L '25  26  23   '$ Calcium 8.9 - 10.3 mg/dL 9.2  9.0  8.9    Maintaining K+ on supplements despite diarrhea  -BUN  better with IVF HS  -stooling less  -3/10 have stopped IVF---recheck labs tomorrow   -episode of loose stool last night but decreased frequency   -PO intake is solid  8.  Hypothyroidism.  Synthroid 9.  Diabetes mellitus.  Hemoglobin A1c 8.3.  Currently on SSI.  Patient was on Levemir 39 units twice daily prior to admission as well as Farxiga 10 mg daily and Trulicity 3 mg every Thursday.  Resume as needed\ CBG (last 3)  Recent Labs    05/26/22 1647 05/26/22 2108 05/27/22 0602  GLUCAP 149* 176* 127*  AM CBG ok on Levimir 50U qhs Restart farxiga '5mg'$   tomorrow to address daytime cbgs, titrate up as needed  3/1  increased faxiga to '10mg'$   3/8 am control is good, CBGs variable during the day depending on intake using 6-10U novolog for SSI per day will start mealtime coverage 3U TID ac 3/9-10 fair to borderline control--eating well, will adjust mealtime to 4u  10.  Hypertension.  Coreg 12.5 mg twice daily, HCTZ 25 mg daily.  Monitor with increased mobility Vitals:   05/26/22 1945 05/27/22 0407  BP: 120/61 (!) 167/76  Pulse: (!) 57 63  Resp: 18 18  Temp: 98.5 F (36.9 C) 98.1 F (36.7 C)  SpO2: 98% 100%  Reduced HCTZ to 12.'5mg'$  3/7-monitor   - fair/borderline control 3/10  11.  Hyperlipidemia.  Crestor 12.  Urinary incontinence  will try to wean off condom cath  13.Norovirus- diarrhea subsiding   meal intake is good  -dc'ed IVF  LOS: 20 days A FACE TO FACE EVALUATION WAS PERFORMED  Meredith Staggers 05/27/2022, 9:25 AM

## 2022-05-27 NOTE — Progress Notes (Signed)
+/-   sleep. 1 large, type 7, continent  BM. Continent using urinal. Denied pain. Wears CPAP with 2 liters O2. Loop recorder dressing in place with old drainage on dressing. Patrici Ranks A

## 2022-05-28 ENCOUNTER — Other Ambulatory Visit (HOSPITAL_COMMUNITY): Payer: Self-pay

## 2022-05-28 LAB — GLUCOSE, CAPILLARY
Glucose-Capillary: 79 mg/dL (ref 70–99)
Glucose-Capillary: 150 mg/dL — ABNORMAL HIGH (ref 70–99)
Glucose-Capillary: 107 mg/dL — ABNORMAL HIGH (ref 70–99)
Glucose-Capillary: 200 mg/dL — ABNORMAL HIGH (ref 70–99)

## 2022-05-28 LAB — CBC
HCT: 39.6 % (ref 39.0–52.0)
Hemoglobin: 13.6 g/dL (ref 13.0–17.0)
MCH: 31.7 pg (ref 26.0–34.0)
MCHC: 34.3 g/dL (ref 30.0–36.0)
MCV: 92.3 fL (ref 80.0–100.0)
Platelets: 192 10*3/uL (ref 150–400)
RBC: 4.29 MIL/uL (ref 4.22–5.81)
RDW: 13.9 % (ref 11.5–15.5)
WBC: 5 10*3/uL (ref 4.0–10.5)
nRBC: 0 % (ref 0.0–0.2)

## 2022-05-28 LAB — BASIC METABOLIC PANEL
Anion gap: 6 (ref 5–15)
BUN: 20 mg/dL (ref 8–23)
CO2: 27 mmol/L (ref 22–32)
Calcium: 8.9 mg/dL (ref 8.9–10.3)
Chloride: 104 mmol/L (ref 98–111)
Creatinine, Ser: 0.85 mg/dL (ref 0.61–1.24)
GFR, Estimated: >60 mL/min (ref >60)
Glucose, Bld: 77 mg/dL (ref 70–99)
Potassium: 3.5 mmol/L (ref 3.5–5.1)
Sodium: 137 mmol/L (ref 135–145)

## 2022-05-28 MED ORDER — INSULIN DETEMIR 100 UNIT/ML ~~LOC~~ SOLN
45.0000 [IU] | Freq: Every day | SUBCUTANEOUS | Status: DC
  Administered 2022-05-28: 45 [IU] via SUBCUTANEOUS
  Filled 2022-05-28 (×2): qty 0.45

## 2022-05-28 MED ORDER — INSULIN PEN NEEDLE 32G X 4 MM MISC
0 refills | Status: DC
  Filled 2022-05-28: qty 100, 30d supply, fill #0

## 2022-05-28 MED ORDER — INSULIN DETEMIR 100 UNIT/ML FLEXPEN
45.0000 [IU] | PEN_INJECTOR | Freq: Every day | SUBCUTANEOUS | 0 refills | Status: DC
  Filled 2022-05-28: qty 15, 33d supply, fill #0

## 2022-05-28 NOTE — Plan of Care (Signed)
  Problem: RH Balance Goal: LTG Patient will maintain dynamic standing with ADLs (OT) Description: LTG:  Patient will maintain dynamic standing balance with assist during activities of daily living (OT)  Outcome: Completed/Met   Problem: Sit to Stand Goal: LTG:  Patient will perform sit to stand in prep for activites of daily living with assistance level (OT) Description: LTG:  Patient will perform sit to stand in prep for activites of daily living with assistance level (OT) Outcome: Completed/Met   Problem: RH Grooming Goal: LTG Patient will perform grooming w/assist,cues/equip (OT) Description: LTG: Patient will perform grooming with assist, with/without cues using equipment (OT) Outcome: Completed/Met   Problem: RH Bathing Goal: LTG Patient will bathe all body parts with assist levels (OT) Description: LTG: Patient will bathe all body parts with assist levels (OT) Outcome: Completed/Met   Problem: RH Dressing Goal: LTG Patient will perform upper body dressing (OT) Description: LTG Patient will perform upper body dressing with assist, with/without cues (OT). Outcome: Completed/Met Goal: LTG Patient will perform lower body dressing w/assist (OT) Description: LTG: Patient will perform lower body dressing with assist, with/without cues in positioning using equipment (OT) Outcome: Completed/Met   Problem: RH Toileting Goal: LTG Patient will perform toileting task (3/3 steps) with assistance level (OT) Description: LTG: Patient will perform toileting task (3/3 steps) with assistance level (OT)  Outcome: Completed/Met   Problem: RH Functional Use of Upper Extremity Goal: LTG Patient will use RT/LT upper extremity as a (OT) Description: LTG: Patient will use right/left upper extremity as a stabilizer/gross assist/diminished/nondominant/dominant level with assist, with/without cues during functional activity (OT) Outcome: Completed/Met   Problem: RH Simple Meal Prep Goal: LTG Patient  will perform simple meal prep w/assist (OT) Description: LTG: Patient will perform simple meal prep with assistance, with/without cues (OT). Outcome: Completed/Met   Problem: RH Toilet Transfers Goal: LTG Patient will perform toilet transfers w/assist (OT) Description: LTG: Patient will perform toilet transfers with assist, with/without cues using equipment (OT) Outcome: Completed/Met   Problem: RH Tub/Shower Transfers Goal: LTG Patient will perform tub/shower transfers w/assist (OT) Description: LTG: Patient will perform tub/shower transfers with assist, with/without cues using equipment (OT) Outcome: Completed/Met

## 2022-05-28 NOTE — TOC Transition Note (Signed)
Discharge medications (7) have been retrieved from [place] weekend lockup and are now being stored in the Transitions of Care [facility]) [facility]acy on the second floor until patient is ready for discharge.

## 2022-05-28 NOTE — Progress Notes (Addendum)
Occupational Therapy Discharge Summary  Patient Details  Name: Joseph Ortega MRN: [id number] Date of Birth: Aug 17, 1955  Date of Discharge from [place] service:May 28, 2022  Today's Date: 05/28/2022 OT Individual Time:  [id number]    OT Time Calculation: 76 min    Patient has met 11 of 11 long term goals due to improved activity tolerance, improved balance, postural control, ability to compensate for deficits, functional use of  LEFT upper and LEFT lower extremity, improved attention, improved awareness, and improved coordination.  Patient to discharge at overall Modified Independent level.  Patient's care partner is independent to provide the necessary physical and cognitive assistance at discharge.    Reasons goals not met: all goals met  Recommendation:  Patient will benefit from ongoing skilled OT services in outpatient setting to continue to advance functional skills in the area of BADL, iADL, Vocation, and Reduce care partner burden.  Equipment: No equipment provided  Reasons for discharge: treatment goals met and discharge from hospital  Patient/family agrees with progress made and goals achieved: Yes  OT Discharge Skilled Therapeutic Interventions/Progress Updates:  Pt received sitting up in bed presenting to be in good spirits and receptive to skilled OT session with a focus on BADL retraining and d/c planning. Pt reporting 0/10 pain this session and improved energy level. Pt transitioned to EOB mod I and ambulated within his room to gather clothing using RW mod I. Pt requesting to use restroom ambulating and transferring to toilet mod I. Increased time on toilet provided d/t need for BM (continent BM). Pt transferred to shower bench and completed U/LB bathing sitting on tub bench using long handled sponge to wash feet and standing using grab bars to wash bottom mod I with increased time provided. Ambulated to EOB and donned shirt in sitting using taught hemi-dressing technique mod I  with increased time provided. Pt able to cross legs into figure-four position to weave feet into pants/underwear and stand to bring pants to waist using RW mod I. OT reviewed home exercises with Pt to increase carry over upon d/c home. Reviewed energy conservation and fall prevention strategies with Pt to icrease safety with Pt verbalizing understanding. Pt left sitting EOB at end of session with call bell in reach and all needs met. Nursing staff entering room upon OT departure.  Precautions/Restrictions  Precautions Precautions: Fall;Other (comment) Precaution Comments: L hemiparesis, need to keep loop recorder phone within bluetooth range of patient at all times Restrictions Weight Bearing Restrictions: No General     ADL ADL Equipment Provided: Long-handled sponge Eating: Modified independent Where Assessed-Eating: Edge of bed Grooming: Independent, Modified independent Where Assessed-Grooming: Standing at sink Upper Body Bathing: Independent, Modified independent Where Assessed-Upper Body Bathing: Shower Lower Body Bathing: Independent, Modified independent Where Assessed-Lower Body Bathing: Shower Upper Body Dressing: Modified independent (Device), Independent Where Assessed-Upper Body Dressing: Edge of bed Lower Body Dressing: Modified independent, Independent Where Assessed-Lower Body Dressing: Edge of bed Toileting: Modified independent Where Assessed-Toileting: Toilet, Bedside Commode Toilet Transfer: Modified independent Armed forces technical officer Method: Counselling psychologist: Grab bars, Radiographer, therapeutic: Not assessed Social research officer, government: Modified independent Social research officer, government Method: Heritage manager: Radio broadcast assistant, Grab bars, Shower seat with back ADL Comments: min A amb with RW with limited L grasp- now with L walker splint, min A UB self care, mod a LB self care pull on clothing Vision Baseline  Vision/History: 1 Wears glasses Patient Visual Report: Peripheral vision impairment Vision Assessment?: Yes Eye Alignment: Within Functional  Limits Ocular Range of Motion: Within Functional Limits Alignment/Gaze Preference: Within Defined Limits Tracking/Visual Pursuits: Requires cues, head turns, or add eye shifts to track (turns head to L when scanning to L side d/t peripheral vision deficit) Saccades: Additional eye shifts occurred during testing Convergence: Within functional limits Visual Fields: Left visual field deficit;Left homonymous hemianopsia Perception  Perception: Within Functional Limits Praxis Praxis: Intact Cognition Cognition Overall Cognitive Status: Impaired/Different from baseline Arousal/Alertness: Awake/alert Orientation Level: Person;Place;Situation Person: Oriented Place: Oriented Situation: Oriented Memory: Impaired Memory Impairment: Decreased recall of new information;Retrieval deficit Attention: Focused;Sustained Focused Attention: Appears intact Sustained Attention: Appears intact Awareness: Appears intact Problem Solving: Impaired Executive Function: Sequencing;Organizing Sequencing: Impaired Sequencing Impairment: Verbal complex;Functional complex Organizing: Impaired Organizing Impairment: Verbal complex;Functional complex Safety/Judgment: Appears intact Brief Interview for Mental Status (BIMS) Repetition of Three Words (First Attempt): 3 Temporal Orientation: Year: Correct Temporal Orientation: Month: Accurate within 5 days Temporal Orientation: Day: Correct Recall: "Sock": Yes, no cue required Recall: "Blue": Yes, no cue required Recall: "Bed": Yes, no cue required BIMS Summary Score: 15 Sensation Sensation Light Touch: Appears Intact Hot/Cold: Not tested Proprioception: Appears Intact Stereognosis: Not tested Coordination Gross Motor Movements are Fluid and Coordinated: No Fine Motor Movements are Fluid and Coordinated:  No Coordination and Movement Description: continues to have L hemiparesis impacting coordination of movements but improving and WFL for household level tasks using compensatory strategies as needed Finger Nose Finger Test: Impaire L side 9 Hole Peg Test: unable to test Motor  Motor Motor: Hemiplegia;Abnormal tone;Abnormal postural alignment and control Motor - Discharge Observations: continues to have L hemiparesis Mobility  Bed Mobility Bed Mobility: Supine to Sit;Sit to Supine Supine to Sit: Independent with assistive device Sit to Supine: Independent with assistive device Transfers Sit to Stand: Independent with assistive device Stand to Sit: Independent with assistive device  Trunk/Postural Assessment  Cervical Assessment Cervical Assessment: Exceptions to [facility] (slight forward head) Thoracic Assessment Thoracic Assessment: Exceptions to [facility] (rounded shoudlers) Lumbar Assessment Lumbar Assessment: Exceptions to [facility] (flexible pelvic tilt) Postural Control Postural Control: Within Functional Limits (utilizes RW for BUE support)  Balance Balance Balance Assessed: Yes Static Sitting Balance Static Sitting - Balance Support: Feet supported Static Sitting - Level of Assistance: 6: Modified independent (Device/Increase time) Dynamic Sitting Balance Dynamic Sitting - Balance Support: Feet supported Dynamic Sitting - Level of Assistance: 6: Modified independent (Device/Increase time) Dynamic Sitting - Balance Activities: Forward lean/weight shifting;Reaching for weighted objects;Reaching for objects Sitting balance - Comments: seated EOB Static Standing Balance Static Standing - Balance Support: During functional activity;Bilateral upper extremity supported Static Standing - Level of Assistance: 6: Modified independent (Device/Increase time) Dynamic Standing Balance Dynamic Standing - Balance Support: During functional activity;Bilateral upper extremity supported Dynamic Standing -  Level of Assistance: 6: Modified independent (Device/Increase time) Extremity/Trunk Assessment RUE Assessment RUE Assessment: Within Functional Limits LUE Assessment LUE Assessment: Exceptions to [facility] Passive Range of Motion (PROM) Comments: grossly WFL's with tighness at end ranges Active Range of Motion (AROM) Comments: sh 0-45 General Strength Comments: Grip 3/5, bicep/tricep 4/5, 2/5 grossly with flexor synergy pattern at shoulder   Janey Genta 05/28/2022, 4:58 PM

## 2022-05-28 NOTE — Progress Notes (Signed)
Occupational Therapy Session Note  Patient Details  Name: Joseph Ortega MRN: [id number] Date of Birth: September 02, 1955  Today's Date: 05/28/2022 OT Individual Time: 1100-1158 OT Individual Time Calculation (min): 58 min    Short Term Goals: Week 1:  OT Short Term Goal 1 (Week 1): Complete UB self care with S using hemi techniques OT Short Term Goal 1 - Progress (Week 1): Met OT Short Term Goal 2 (Week 1): Complete LB self care using hemi techniques with CGA OT Short Term Goal 2 - Progress (Week 1): Met OT Short Term Goal 3 (Week 1): Amb for toilet transfer with RW with CGA OT Short Term Goal 3 - Progress (Week 1): Met OT Short Term Goal 4 (Week 1): Pt will stand for oral care or kitchen sink task for up to 5 min with CGA OT Short Term Goal 4 - Progress (Week 1): Met Week 2:  OT Short Term Goal 1 (Week 2): STG=LTG d/t Pt ELOS   Skilled Therapeutic Interventions/Progress Updates:    Pt received sitting EOB and ready for therapy. He donned his L AFO without A.  Pt sat EOB and worked on LUE AROM exercises of touching hand to R elbow to L knee and then L knee to L hip.  He has a fair grasp and release so removed walker splint.  Pt did well with all sit to stands to RW with mod I. Had pt practice numerous tasks in the room to relate to IADLs of housekeeping (removing sheets and 3 pillow cases, remaking bed with sheets and 3 pillows actively using L hand as an active A,  walking around the room to place sheets and towels in the hamper, clothing in the drawers, old papers and trash in the trashcan, organizing his supplies).  Pt did get tired a few times and encouraged pt to rest.  Discussed energy conservation strategies at home.  Pt has a nightstand on L side of bed. Had pt lay down and practice rolling to his L so he could reach with R hand for items on night stand.  Pt did well with this. Pt sat back to EOB to prepare for lunch. All needs met.   Therapy Documentation Precautions:   Precautions Precautions: Fall, Other (comment) Precaution Comments: L hemiparesis, need to keep loop recorder phone within bluetooth range of patient at all times Restrictions Weight Bearing Restrictions: No Other Position/Activity Restrictions: Needs Loop Monitor cardiac device wiht him in therapy  Pain: Pain Assessment Pain Scale: 0-10 Pain Score: 0-No pain ADL: ADL Equipment Provided: Long-handled sponge Eating: Set up Where Assessed-Eating: Bed level Grooming: Minimal assistance Where Assessed-Grooming: Sitting at sink Upper Body Bathing: Supervision/safety Where Assessed-Upper Body Bathing: Shower Lower Body Bathing: Supervision/safety Where Assessed-Lower Body Bathing: Shower Upper Body Dressing: Supervision/safety Where Assessed-Upper Body Dressing: Edge of bed Lower Body Dressing: Supervision/safety Where Assessed-Lower Body Dressing: Edge of bed Toileting: Supervision/safety Where Assessed-Toileting: Glass blower/designer: Therapist, music Method: Counselling psychologist: Bedside commode, Energy manager: Curator Method: Heritage manager: Radio broadcast assistant, Grab bars, Shower seat with back ADL Comments: min A amb with RW with limited L grasp- now with L walker splint, min A UB self care, mod a LB self care pull on clothing   Therapy/Group: Individual Therapy  [place] 05/28/2022, 9:42 AM

## 2022-05-28 NOTE — Progress Notes (Signed)
Physical Therapy Session Note  Patient Details  Name: Joseph Ortega MRN: [id number] Date of Birth: June 22, 1955  Today's Date: 05/28/2022 PT Individual Time: 1300-1358 PT Individual Time Calculation (min): 58 min   Short Term Goals: Week 3:  PT Short Term Goal 1 (Week 3): = to LTGs based on ELOS  Skilled Therapeutic Interventions/Progress Updates:    Session focused on grad day activities and preparation for upcoming discharge tomorrow. Pt continues to still demonstrate overall decreased endurance causing some mild fluctuations in overall level of assistance but significantly improved. Educated pt on importance of self monitoring fatigue level and energy conservation techniques. Pt ready EOB with shoes and AFO donned. Sit > stand with RW with overall mod I level with extra time for mobility tasks and performed short distance gait in room to bathroom at mod I level, slowed cadence. Pt performed toileting in standing mod I including clothing management. Functional gait training with RW in hallway for mobility and overall strengthening/endurance x 150' supervision to modified independent. Discussed home bed set up and pt uses pillows to maintain HOB elevated and described set up in room for access. Practiced stair negotiation with single and bilateral handrails up/down 4 steps with CGA to min assist overall with cues for technique. Discussed home set up with use of grab bar and placing walker at the top of the steps. Simulated car transfer completed with overall supervision to SUV height. Community mobility retraining on uneven surfaces including ramp and mulched surface with close supervision. Pt did have one episode of LOB when turning when coming off of ramp requiring min assist to recover with RW. Pt then able to walk x 120' with RW but requiring more supervision and cues for upright posture as fatigued. Demonstrates forward lean on RW and decreased foot clearance - discussed watching out for signs of  increased fatigue and increased fall risk - pt verbalized understanding. End of session transferred back to bed with all needs in reach mod I with RW.    Therapy Documentation Precautions:  Precautions Precautions: Fall, Other (comment) Precaution Comments: L hemiparesis, need to keep loop recorder phone within bluetooth range of patient at all times Restrictions Weight Bearing Restrictions: No Other Position/Activity Restrictions: Needs Loop Monitor cardiac device wiht him in therapy  Pain: Denies pain. Occasional abdominal pain still ongoing.     Therapy/Group: Individual Therapy  Canary Brim Ivory Broad, PT, DPT, CBIS  05/28/2022, 2:15 PM

## 2022-05-28 NOTE — Progress Notes (Signed)
PROGRESS NOTE   Subjective/Complaints: Had one large liquid stool last night associated with LLQ discomfort/spasm. Otherwise did well. Eating 100%  ROS: Patient denies CP, SOB, N/V/D    Objective:   No results found. Recent Labs    05/28/22 0621  WBC 5.0  HGB 13.6  HCT 39.6  PLT 192    Recent Labs    05/25/22 1104 05/28/22 0621  NA 137 137  K 3.5 3.5  CL 101 104  CO2 25 27  GLUCOSE 161* 77  BUN 18 20  CREATININE 0.94 0.85  CALCIUM 9.2 8.9     Intake/Output Summary (Last 24 hours) at 05/28/2022 0900 Last data filed at 05/28/2022 0800 Gross per 24 hour  Intake 832 ml  Output 2225 ml  Net -1393 ml         Physical Exam: Vital Signs Blood pressure (!) 150/62, pulse (!) 54, temperature 98 F (36.7 C), temperature source Oral, resp. rate 18, height '6\' 1"'$  (1.854 m), weight 81.9 kg, SpO2 98 %.   General: No acute distress Mood and affect are appropriate Heart: Regular rate and rhythm no rubs murmurs or extra sounds Lungs: Clear to auscultation, breathing unlabored, no rales or wheezes Abdomen: Positive bowel sounds, soft nontender to palpation, nondistended Extremities: No clubbing, cyanosis, or edema Skin: No evidence of breakdown, no evidence of rash   Skin: No evidence of breakdown, no evidence of rash Neurologic: Cranial nerves II through XII intact, motor strength is 5/5 in right and   3- left deltoid, bicep, tricep, grip, 5/5 right and 4-/5 left hip flexor, knee extensors, 2- ankle DF, PF.   Assessment/Plan: 1. Functional deficits which require 3+ hours per day of interdisciplinary therapy in a comprehensive inpatient rehab setting. Physiatrist is providing close team supervision and 24 hour management of active medical problems listed below. Physiatrist and rehab team continue to assess barriers to discharge/monitor patient progress toward functional and medical goals  Care Tool:  Bathing     Body parts bathed by patient: Chest, Abdomen, Front perineal area, Right upper leg, Left upper leg, Face, Buttocks, Right lower leg, Left lower leg, Left arm   Body parts bathed by helper: Right arm, Buttocks, Left lower leg, Right lower leg     Bathing assist Assist Level: Supervision/Verbal cueing     Upper Body Dressing/Undressing Upper body dressing   What is the patient wearing?: Pull over shirt    Upper body assist Assist Level: Minimal Assistance - Patient > 75%    Lower Body Dressing/Undressing Lower body dressing      What is the patient wearing?: Underwear/pull up, Pants     Lower body assist Assist for lower body dressing: Contact Guard/Touching assist     Toileting Toileting    Toileting assist Assist for toileting: Contact Guard/Touching assist     Transfers Chair/bed transfer  Transfers assist     Chair/bed transfer assist level: Minimal Assistance - Patient > 75% Chair/bed transfer assistive device: Walker, Armrests, Orthosis   Locomotion Ambulation   Ambulation assist      Assist level: Minimal Assistance - Patient > 75% Assistive device: Walker-rolling Max distance: 133f   Walk 10 feet activity  Assist     Assist level: Contact Guard/Touching assist Assistive device: Walker-rolling, Orthosis   Walk 50 feet activity   Assist    Assist level: Minimal Assistance - Patient > 75% Assistive device: Walker-rolling, Orthosis    Walk 150 feet activity   Assist    Assist level: Contact Guard/Touching assist Assistive device: Walker-rolling, Orthosis    Walk 10 feet on uneven surface  activity   Assist Walk 10 feet on uneven surfaces activity did not occur: Safety/medical concerns         Wheelchair     Assist Is the patient using a wheelchair?: Yes Type of Wheelchair: Manual    Wheelchair assist level: Maximal Assistance - Patient 25 - 49% Max wheelchair distance: 60 ft    Wheelchair 50 feet with 2 turns  activity    Assist        Assist Level: Maximal Assistance - Patient 25 - 49%   Wheelchair 150 feet activity     Assist      Assist Level: Total Assistance - Patient < 25%   Blood pressure (!) 150/62, pulse (!) 54, temperature 98 F (36.7 C), temperature source Oral, resp. rate 18, height '6\' 1"'$  (1.854 m), weight 81.9 kg, SpO2 98 %.  Medical Problem List and Plan: 1. 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 Had a prior Left hemiparesis but has more weakness since most recent CVA              -patient may  shower             -ELOS/Goals: was to d/c at [place] but now at [facility] - May f/u with Dr Naaman Plummer in office-    --Continue CIR therapies including PT, OT  - pt has had decline in fxn needed to extend stay until 3/12  2.  Antithrombotics: -DVT/anticoagulation:  Pharmaceutical: Heparin- switch to lovenox , no renal disease              -antiplatelet therapy: Aspirin 81 mg daily resumed on 05/07/2022 3. Pain Management: Topamax 50 mg daily for headache 4. Mood/Behavior/Sleep: Provide emotional support             -antipsychotic agents: N/A 5. Neuropsych/cognition: This patient is capable of making decisions on his own behalf. 6. Skin/Wound Care: Routine skin checks 7. Fluids/Electrolytes/Nutrition: Routine in and outs with follow-up chemistries hypoK+ on HCTZ on  KCL, 33mq BID- stable at low normal    Latest Ref Rng & Units 05/28/2022    6:21 AM 05/25/2022   11:04 AM 05/23/2022    7:07 AM  BMP  Glucose 70 - 99 mg/dL 77  161  114   BUN 8 - 23 mg/dL 20  18  33   Creatinine 0.61 - 1.24 mg/dL 0.85  0.94  1.22   Sodium 135 - 145 mmol/L 137  137  137   Potassium 3.5 - 5.1 mmol/L 3.5  3.5  4.1   Chloride 98 - 111 mmol/L 104  101  101   CO2 22 - 32 mmol/L '27  25  26   '$ Calcium 8.9 - 10.3 mg/dL 8.9  9.2  9.0    Maintaining K+ on supplements despite diarrhea  -BUN better with IVF HS  -stooling less  -3/10 have  stopped IVF---recheck labs tomorrow   -episode of loose stool last night but decreased frequency   -PO intake is solid  8.  Hypothyroidism.  Synthroid 9.  Diabetes  mellitus.  Hemoglobin A1c 8.3.  Currently on SSI.  Patient was on Levemir 39 units twice daily prior to admission as well as Farxiga 10 mg daily and Trulicity 3 mg every Thursday.  Resume as needed\ CBG (last 3)  Recent Labs    05/27/22 1708 05/27/22 2110 05/28/22 0608  GLUCAP 113* 161* 79   AM CBG low on Levimir 50U qhs- reduce to 45 U 3/11 Restart farxiga '5mg'$   tomorrow to address daytime cbgs, titrate up as needed  3/1  increased faxiga to '10mg'$   3/8 am control is good, CBGs variable during the day depending on intake using 6-10U novolog for SSI per day will start mealtime coverage 3U TID ac 3/9-10 fair to borderline control--eating well, will adjust mealtime to 4u  10.  Hypertension.  Coreg 12.5 mg twice daily, HCTZ 25 mg daily.  Monitor with increased mobility Vitals:   05/27/22 2013 05/28/22 0512  BP: 133/65 (!) 150/62  Pulse: (!) 54 (!) 54  Resp: 18 18  Temp: 97.7 F (36.5 C) 98 F (36.7 C)  SpO2: 98% 98%  Reduced HCTZ to 12.'5mg'$  3/7-monitor   - fair/borderline control 3/11, overall improving will need PCP f/u  11.  Hyperlipidemia.  Crestor 12.  Urinary incontinence  will try to wean off condom cath  13.Norovirus- diarrhea subsiding   meal intake is good  -dc'ed IVF  LOS: 21 days A FACE TO FACE EVALUATION WAS PERFORMED  Charlett Blake 05/28/2022, 9:00 AM

## 2022-05-28 NOTE — Progress Notes (Signed)
Good night. Wearing CPAP with 2L/O2. Excited about upcoming discharge. Dry scaly patches to leg, eucerin cream applied. No further loose stools. Loop recorder in place to left chest. Cornell Barman

## 2022-05-29 ENCOUNTER — Other Ambulatory Visit (HOSPITAL_COMMUNITY): Payer: Self-pay

## 2022-05-29 LAB — GLUCOSE, CAPILLARY
Glucose-Capillary: 145 mg/dL — ABNORMAL HIGH (ref 70–99)
Glucose-Capillary: 54 mg/dL — ABNORMAL LOW (ref 70–99)
Glucose-Capillary: 112 mg/dL — ABNORMAL HIGH (ref 70–99)

## 2022-05-29 MED ORDER — INSULIN DETEMIR 100 UNIT/ML FLEXPEN
40.0000 [IU] | PEN_INJECTOR | Freq: Every day | SUBCUTANEOUS | 11 refills | Status: DC
  Filled 2022-05-29: qty 10, 25d supply, fill #0

## 2022-05-29 MED ORDER — INSULIN DETEMIR 100 UNIT/ML ~~LOC~~ SOLN
40.0000 [IU] | Freq: Every day | SUBCUTANEOUS | Status: DC
  Filled 2022-05-29: qty 0.4

## 2022-05-29 NOTE — Progress Notes (Signed)
PROGRESS NOTE   Subjective/Complaints: Labs reviewed , no diarrhea, occ abd cramping and gas  ROS: Patient denies CP, SOB, N/V/D    Objective:   No results found. Recent Labs    05/28/22 0621  WBC 5.0  HGB 13.6  HCT 39.6  PLT 192     Recent Labs    05/28/22 0621  NA 137  K 3.5  CL 104  CO2 27  GLUCOSE 77  BUN 20  CREATININE 0.85  CALCIUM 8.9     Intake/Output Summary (Last 24 hours) at 05/29/2022 [id number] Last data filed at 05/29/2022 0720 Gross per 24 hour  Intake 709 ml  Output 1725 ml  Net -1016 ml         Physical Exam: Vital Signs Blood pressure (!) 144/68, pulse (!) 50, temperature 98.1 F (36.7 C), temperature source Oral, resp. rate 16, height '6\' 1"'$  (1.854 m), weight 81.9 kg, SpO2 97 %.   General: No acute distress Mood and affect are appropriate Heart: Regular rate and rhythm no rubs murmurs or extra sounds Lungs: Clear to auscultation, breathing unlabored, no rales or wheezes Abdomen: Positive bowel sounds, soft nontender to palpation, nondistended Extremities: No clubbing, cyanosis, or edema Skin: No evidence of breakdown, no evidence of rash   Skin: No evidence of breakdown, no evidence of rash Neurologic: Cranial nerves II through XII intact, motor strength is 5/5 in right and   3- left deltoid, bicep, tricep, grip, 5/5 right and 4-/5 left hip flexor, knee extensors, 2- ankle DF, PF.   Assessment/Plan: 1. Functional deficits due to Right MCA and PCA hemorrhagic infarct   Stable for D/C today F/u PCP in 3-4 weeks F/u PM&R 2 weeks- Dr Naaman Plummer  F/u neuro 1-2 mo  See D/C summary See D/C instructions   Care Tool:  Bathing    Body parts bathed by patient: Chest, Abdomen, Front perineal area, Right upper leg, Left upper leg, Face, Buttocks, Right lower leg, Left lower leg, Left arm, Right arm   Body parts bathed by helper: Right arm, Buttocks, Left lower leg, Right lower leg      Bathing assist Assist Level: Independent with assistive device Assistive Device Comment: Seated on TTB using long0handled sponge   Upper Body Dressing/Undressing Upper body dressing   What is the patient wearing?: Pull over shirt    Upper body assist Assist Level: Independent with assistive device Assistive Device Comment: Increased time  Lower Body Dressing/Undressing Lower body dressing      What is the patient wearing?: Underwear/pull up, Pants     Lower body assist Assist for lower body dressing: Independent with assitive device Assistive Device Comment: RW and increased time   Chartered loss adjuster assist Assist for toileting: Independent with assistive device Assistive Device Comment: RW and BSC   Transfers Chair/bed transfer  Transfers assist     Chair/bed transfer assist level: Independent with assistive device Chair/bed transfer assistive device: Armrests, Walker, Orthosis   Locomotion Ambulation   Ambulation assist      Assist level: Supervision/Verbal cueing Assistive device: Walker-rolling Max distance: 12f   Walk 10 feet activity   Assist     Assist level:  Independent with assistive device Assistive device: Walker-rolling, Orthosis   Walk 50 feet activity   Assist    Assist level: Independent with assistive device Assistive device: Walker-rolling, Orthosis    Walk 150 feet activity   Assist    Assist level: Supervision/Verbal cueing Assistive device: Walker-rolling, Orthosis    Walk 10 feet on uneven surface  activity   Assist Walk 10 feet on uneven surfaces activity did not occur: Safety/medical concerns   Assist level: Supervision/Verbal cueing Assistive device: Walker-rolling, Orthosis   Wheelchair     Assist Is the patient using a wheelchair?: Yes Type of Wheelchair: Manual    Wheelchair assist level: Maximal Assistance - Patient 25 - 49% Max wheelchair distance: 20'    Wheelchair 50 feet  with 2 turns activity    Assist        Assist Level: Total Assistance - Patient < 25%   Wheelchair 150 feet activity     Assist      Assist Level: Total Assistance - Patient < 25%   Blood pressure (!) 144/68, pulse (!) 50, temperature 98.1 F (36.7 C), temperature source Oral, resp. rate 16, height '6\' 1"'$  (1.854 m), weight 81.9 kg, SpO2 97 %.  Medical Problem List and Plan: 1. 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 Had a prior Left hemiparesis but has more weakness since most recent CVA              -patient may  shower             -ELOS/Goals: was to d/c at [place] but now at [facility] - May f/u with Dr Naaman Plummer in office-    --Continue CIR therapies including PT, OT  - pt has had decline in fxn needed to extend stay until 3/12  2.  Antithrombotics: -DVT/anticoagulation:  Pharmaceutical: Heparin- switch to lovenox , no renal disease              -antiplatelet therapy: Aspirin 81 mg daily resumed on 05/07/2022 3. Pain Management: Topamax 50 mg daily for headache 4. Mood/Behavior/Sleep: Provide emotional support             -antipsychotic agents: N/A 5. Neuropsych/cognition: This patient is capable of making decisions on his own behalf. 6. Skin/Wound Care: Routine skin checks 7. Fluids/Electrolytes/Nutrition: Routine in and outs with follow-up chemistries hypoK+ on HCTZ on  KCL, 48mq BID- stable at low normal    Latest Ref Rng & Units 05/28/2022    6:21 AM 05/25/2022   11:04 AM 05/23/2022    7:07 AM  BMP  Glucose 70 - 99 mg/dL 77  161  114   BUN 8 - 23 mg/dL 20  18  33   Creatinine 0.61 - 1.24 mg/dL 0.85  0.94  1.22   Sodium 135 - 145 mmol/L 137  137  137   Potassium 3.5 - 5.1 mmol/L 3.5  3.5  4.1   Chloride 98 - 111 mmol/L 104  101  101   CO2 22 - 32 mmol/L '27  25  26   '$ Calcium 8.9 - 10.3 mg/dL 8.9  9.2  9.0    Maintaining K+ on supplements despite diarrhea  -BUN better with IVF HS  -stooling  less  -3/10 have stopped IVF---recheck labs tomorrow   -episode of loose stool last night but decreased frequency   -PO intake is solid  8.  Hypothyroidism.  Synthroid 9.  Diabetes mellitus.  Hemoglobin A1c 8.3.  Currently  on SSI.  Patient was on Levemir 39 units twice daily prior to admission as well as Farxiga 10 mg daily and Trulicity 3 mg every Thursday.  Resume as needed\ CBG (last 3)  Recent Labs    05/28/22 2043 05/29/22 0638 05/29/22 0700  GLUCAP 200* 54* 145*   AM CBG low on Levimir 50U qhs- reduce to 45 U 3/11- would reduce to 40U +/- add hs snack  Restart farxiga '5mg'$   tomorrow to address daytime cbgs, titrate up as needed  3/1  increased faxiga to '10mg'$   3/8 am control is good, CBGs variable during the day depending on intake using 6-10U novolog for SSI per day will start mealtime coverage 3U TID ac 3/9-10 fair to borderline control--eating well, will adjust mealtime to 4u  10.  Hypertension.  Coreg 12.5 mg twice daily, HCTZ 25 mg daily.  Monitor with increased mobility Vitals:   05/28/22 2018 05/29/22 0640  BP: (!) 147/77 (!) 144/68  Pulse: (!) 57 (!) 50  Resp: 16 16  Temp: 98.2 F (36.8 C) 98.1 F (36.7 C)  SpO2: 97% 97%  Reduced HCTZ to 12.'5mg'$  3/7-monitor   - fair/borderline control 3/12, overall improving will need PCP f/u  11.  Hyperlipidemia.  Crestor 12.  Urinary incontinence  will try to wean off condom cath  13.Norovirus- diarrhea subsiding   meal intake is good  -dc'ed IVF  LOS: 22 days A FACE TO FACE EVALUATION WAS PERFORMED  Charlett Blake 05/29/2022, 8:38 AM

## 2022-05-29 NOTE — Progress Notes (Signed)
Patient discharged to home, accompanied by hiw son.

## 2022-05-29 NOTE — Progress Notes (Signed)
Inpatient Rehabilitation Discharge Medication Review by a Pharmacist  A complete drug regimen review was completed for this patient to identify any potential clinically significant medication issues.  High Risk Drug Classes Is patient taking? Indication by Medication  Antipsychotic No   Anticoagulant No   Antibiotic No   Opioid No   Antiplatelet Yes Aspirin - stroke ppx  Hypoglycemics/insulin Yes Farxiga, Trulicity, insulin detemir/aspart - DM  Vasoactive Medication Yes Carvedilol, HCTZ - HTN  Chemotherapy No   Other Yes duloxetine - neuropathic pain levothyroxine - Hypothyroidism pantoprazole - GERD rosuvastatin - HLD Topiramate - HA ppx Methocarbamol - muscle spasms     Type of Medication Issue Identified Description of Issue Recommendation(s)  Drug Interaction(s) (clinically significant)     Duplicate Therapy     Allergy     No Medication Administration End Date     Incorrect Dose     Additional Drug Therapy Needed     Significant med changes from prior encounter (inform family/care partners about these prior to discharge). Stopped at discharge from IP: Atorvastatin, doxazosin, furosemide, guanfacine, metoprolol, zofran, Kcl, trazodone, valsartan Communicate medication changes with patient/family at discharge  Other       Clinically significant medication issues were identified that warrant physician communication and completion of prescribed/recommended actions by midnight of the next day:  No   Pharmacist comments: n/a   Time spent performing this drug regimen review (minutes): 20   Thank you for allowing pharmacy to be a part of this patient's care.  Ardyth Harps, PharmD Clinical Pharmacist

## 2022-05-29 NOTE — Plan of Care (Signed)
°  Problem: RH Problem Solving °Goal: LTG Patient will demonstrate problem solving for (SLP) °Description: LTG:  Patient will demonstrate problem solving for basic/complex daily situations with cues  (SLP) °Outcome: Completed/Met °  °Problem: RH Memory °Goal: LTG Patient will use memory compensatory aids to (SLP) °Description: LTG:  Patient will use memory compensatory aids to recall biographical/new, daily complex information with cues (SLP) °Outcome: Completed/Met °  °

## 2022-05-29 NOTE — Progress Notes (Signed)
Hypoglycemic Event  CBG: 54  Treatment: 4 oz juice/soda Patient also ate breakfast provided. Symptoms: None  Follow-up CBG: Time: 7.00 CBG Result:145  Possible Reasons for Event: Unknown  Comments/MD notified: [employer]. (PA)  Patent remains alert and oriented and coherent. So sign of sympathetic stimulation noted.   Jari Pigg [place]

## 2022-05-30 ENCOUNTER — Telehealth: Payer: Self-pay | Admitting: *Deleted

## 2022-05-30 ENCOUNTER — Other Ambulatory Visit (HOSPITAL_COMMUNITY): Payer: Self-pay

## 2022-05-30 NOTE — Telephone Encounter (Signed)
FYI: I made a TC call this morning and patient needed an appointment with neurology. I called [facility] to schedule an appointment per patient request. The scheduling department is not able to make an appointment until patient makes some form of payment on his account with them.

## 2022-05-30 NOTE — Telephone Encounter (Signed)
No, lasix is not listed on his dc meds.

## 2022-05-30 NOTE — Telephone Encounter (Signed)
Also patient wants to know if he is to stop Lasix?

## 2022-05-30 NOTE — Progress Notes (Signed)
Inpatient Rehabilitation Care Coordinator Discharge Note   Patient Details  Name: Joseph Ortega MRN: [id number] Date of Birth: 04-Dec-1955   Discharge location: Home with spouse  Length of Stay: 12 days  Discharge activity level: mod i /sup  Home/community participation: spouse  Patient response [id number] Literacy - How often do you need to have someone help you when you read instructions, pamphlets, or other written material from your doctor or pharmacy?: Sometimes  Patient response [id number] Isolation - How often do you feel lonely or isolated from those around you?: Never  Services provided included: MD, RD, PT, OT, RN, SLP, CM, TR, Pharmacy, Neuropsych, SW  Financial Services:  Charity fundraiser Utilized: [place] offered to/list presented to: Patient  Follow-up services arranged:  Outpatient    Outpatient Servicies: neuro      Patient response to transportation need: Is the patient able to respond to transportation needs?: Yes In the past 12 months, has lack of transportation kept you from medical appointments or from getting medications?: No In the past 12 months, has lack of transportation kept you from meetings, work, or from getting things needed for daily living?: No    Comments (or additional information):  Patient/Family verbalized understanding of follow-up arrangements:  Yes  Individual responsible for coordination of the follow-up plan: self  Confirmed correct DME delivered: Dyanne Iha 05/30/2022    Dyanne Iha

## 2022-05-30 NOTE — Telephone Encounter (Signed)
Transitional Care call-- [place]   Are you/is patient experiencing any problems since coming home? NoAre there any questions regarding any aspect of care? No Are there any questions regarding medications administration/dosing? Lasix therapy Are meds being taken as prescribed? Re-iterated medications per d/c no lasix continue HCTZ Have there been any falls? No Has Home Health been to the house and/or have they contacted you? N/a If not, have you tried to contact them? N/a Can we help you contact them? N/a Are bowels and bladder emptying properly? Yes Are there any unexpected incontinence issues? Yes this is not a new issue. If applicable, is patient following bowel/bladder programs? No Any fevers, problems with breathing, unexpected pain? No Are there any skin problems or new areas of breakdown? No Has the patient/family member arranged specialty MD follow up (ie cardiology/neurology/renal/surgical/etc)? No  Can we help arrange? Yes Neurology I've called [facility]  and patient has balance that requires payment before appt can be made. Does the patient need any other services or support that we can help arrange? No Are caregivers following through as expected in assisting the patient? Yes spouse Has the patient quit smoking, drinking alcohol, or using drugs as recommended? N/a  Appointment time: 10:00 Arrive time: 9:40 Who it is with here: [place]

## 2022-05-31 ENCOUNTER — Encounter: Payer: BC Managed Care – PPO | Admitting: Occupational Therapy

## 2022-06-03 NOTE — Therapy (Addendum)
OUTPATIENT SPEECH LANGUAGE PATHOLOGY EVALUATION   Patient Name: Joseph Ortega MRN: [id number] DOB:[birth year], 67 y.o., male Today's Date: 06/04/2022  PCP: [facility] REFERRING PROVIDER: Cathlyn Parsons, PA-C  END OF SESSION:  End of Session - 06/04/22 0802     Visit Number 1    Number of Visits 17    Date for SLP Re-Evaluation 07/30/22    Authorization Type [employer] [facility] (reportedly changing to [facility] 06/18/22)    SLP Start Time 0801    SLP Stop Time  0845    SLP Time Calculation (min) 44 min    Activity Tolerance Patient tolerated treatment well             Past Medical History:  Diagnosis Date   Diabetes mellitus without complication ([place])    Hypertension    Thyroid disease    Past Surgical History:  Procedure Laterality Date   HERNIA REPAIR     IR ANGIO INTRA EXTRACRAN SEL COM CAROTID INNOMINATE BILAT MOD SED  03/09/2021   IR ANGIO VERTEBRAL SEL VERTEBRAL BILAT MOD SED  03/09/2021   IR US GUIDE VASC ACCESS RIGHT  03/09/2021   LOOP RECORDER INSERTION N/A 05/07/2022   Procedure: LOOP RECORDER INSERTION;  Surgeon: Vickie Epley, MD;  Location: [place] [facility];  Service: Cardiovascular;  Laterality: N/A;   Patient Active Problem List   Diagnosis Date Noted   Hyperlipidemia 05/07/2022   Right middle cerebral artery stroke ([place]) 05/07/2022   Stroke (cerebrum) ([place]) 05/02/2022   ICH (intracerebral hemorrhage) ([place]) 03/17/2021   Hypothyroidism 03/12/2021   OSA (obstructive sleep apnea) 03/12/2021   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   Uncontrolled type 2 diabetes mellitus with hyperglycemia ([place]) 03/12/2021   Redness of both eyes 03/12/2021   Morbid obesity ([place]) 03/11/2021   Essential hypertension 03/11/2021   Tobacco abuse 03/11/2021   Herpes zoster with complication    SAH (subarachnoid hemorrhage) ([place]) 03/08/2021    ONSET DATE: 05/02/22   REFERRING DIAG: [id number] (ICD-10-CM) - Cerebral infarction due to unspecified occlusion or  stenosis of right middle cerebral artery  THERAPY DIAG:  Cognitive communication deficit  Rationale for Evaluation and Treatment: Rehabilitation  SUBJECTIVE:   SUBJECTIVE STATEMENT: "I'm getting better." Pt accompanied by: self  PERTINENT HISTORY: Pt is a 67 y.o. male who presented with headache and chest pain. MRI brain 2/14: area of infarction with hemorrhage in the right temporoparietal region. Edema causes  mass effect on the right lateral ventricle and approximately 2 mm right-to-left midline shift. PMH: stroke with left-sided weakness.   PAIN: Are you having pain? No  FALLS: Has patient fallen in last 6 months?  No  LIVING ENVIRONMENT: Lives with: lives with their spouse Lives in: House/apartment  PLOF:  Level of assistance: Independent with ADLs, Independent with IADLs (Pt reports his wife is handicapped and before recent stroke he was assisting her; "now our roles are reversed")  Employment: Retired (last worked Nov 2022)  PATIENT GOALS: "Help with bills, take my meds"   OBJECTIVE:   DIAGNOSTIC FINDINGS: CT Head 05-06-22:  IMPRESSION: 1. Overall unchanged size of a right parietal infarct with petechial hemorrhage. No frank hematoma is seen. Overall unchanged degree of surrounding edema, without significant midline shift. 2. Redemonstrated remote infarcts in the posterior right frontal lobe and right occipital lobe.  COGNITION: Overall cognitive status: Impaired Areas of impairment:  Attention: Impaired: Selective, Alternating, Divided Memory: Impaired: Working Programme researcher, broadcasting/film/video function: Impaired: Problem solving, Organization, Planning, Error awareness, and Slow processing Functional deficits: Wife currently  paying bills and managing meds, which he had been managing before recent stroke. Evaluation revealed slow processing and decreased problem solving resulting in concern for prior independent tasks.   COGNITIVE COMMUNICATION: Following directions: Follows multi-step  commands consistently  Auditory comprehension: WFL Verbal expression: WFL today, but pt reporting intermittent difficulty expressing his thoughts Functional communication: WFL - wife noting impaired communication Comments: Pt reports "I know what I want to say, but the right words won't come out."   STANDARDIZED ASSESSMENTS: CLQT: Attention: Mild, Memory: WNL, Executive Function: Moderate, Language: WNL, Visuospatial Skills: Mild, and Clock Drawing: Mild Overall Mild borderline Moderate PATIENT REPORTED OUTCOME MEASURES (PROM): Deferred d/t time restraint   TODAY'S TREATMENT:                                                                                                                                          06/04/22: Assessment completed this session. Pt has 37 year-old grandson.    PATIENT EDUCATION: Education details: see above Person educated: Patient Education method: Explanation Education comprehension: verbalized understanding and needs further education   GOALS: Goals reviewed with patient? Yes  SHORT TERM GOALS: Target date: 07-02-22  Pt will complete PROM during upcoming session.  Baseline: Goal status: INITIAL  2.  Pt will demonstrate increased attention to detail in structured iADL tasks with 3 or less errors exhibited given occasional min A.  Baseline:  Goal status: INITIAL  3.  Pt will teach back memory strategies with min-A. Baseline:  Goal status: INITIAL  4.  Pt will carryover cognitive compensations to increase independent completion of iADLs given occasional min A in 2/2 opportunities.  Baseline:  Goal status: INITIAL  5.  Pt will ID and correct any communication breakdowns that occur during ST session with occasional min-A.  Baseline:  Goal status: INITIAL  LONG TERM GOALS: Target date: 07/30/2022    Pt will report subjective perception of improved cognitive function by d/c via PROM.  Baseline:  Goal status: INITIAL  2.  Pt will report  successful implementation of cognitive compensations in daily life to participate in iADLs. Baseline:  Goal status: INITIAL  3.  Pt will demonstrate increased independence in targeted iADL tasks given rare min-A from wife.  Baseline:  Goal status: INITIAL  4.  Pt will ID and correct communication breakdowns PRN with mod-I.  Baseline:  Goal status: INITIAL  ASSESSMENT:  CLINICAL IMPRESSION: Patient is a 67 y.o. M who was seen today for CVA in February 2024. Pt presents with mild deficits in attention and visuospatial abilities and moderate deficits in executive function, primarily characterized by slow processing, organization, planning, and error awareness difficulties. Pt reports recent vision changes. Wife currently managing meds and paying bills, which pt previously managed. Pt endorses that his wife has expressed concern for his communication and that he is not adequately communicating his intended message. No overt moments of word-finding difficulty appreciated  across eval. Will continue to monitor and address as needed. Pt would benefit from skilled ST services to assist pt in accomplishing iADLs and to support cognitive-communication challenges.   OBJECTIVE IMPAIRMENTS: include attention, memory, and executive functioning. These impairments are limiting patient from managing medications, managing finances, household responsibilities, and ADLs/IADLs. Factors affecting potential to achieve goals and functional outcome are co-morbidities. Patient will benefit from skilled SLP services to address above impairments and improve overall function.  REHAB POTENTIAL: Good  PLAN:  SLP FREQUENCY: 2x/week  SLP DURATION: 8 weeks  PLANNED INTERVENTIONS: Language facilitation, Cognitive reorganization, Internal/external aids, Functional tasks, SLP instruction and feedback, Compensatory strategies, and Patient/family education   Leroy Libman, [place] 06/04/2022, 1:07 PM   I agree with the  following treatment note after reviewing documentation. This session was performed under the supervision of a licensed clinician.  Marzetta Board, [place] 06/13/2022, 8:45 AM

## 2022-06-04 ENCOUNTER — Encounter: Payer: Self-pay | Admitting: Occupational Therapy

## 2022-06-04 ENCOUNTER — Ambulatory Visit: Payer: BC Managed Care – PPO

## 2022-06-04 ENCOUNTER — Ambulatory Visit: Payer: BC Managed Care – PPO | Admitting: Occupational Therapy

## 2022-06-04 ENCOUNTER — Ambulatory Visit: Payer: BC Managed Care – PPO | Attending: Physical Medicine & Rehabilitation | Admitting: Physical Therapy

## 2022-06-04 VITALS — BP 142/63 | HR 54

## 2022-06-04 DIAGNOSIS — R600 Localized edema: Secondary | ICD-10-CM | POA: Insufficient documentation

## 2022-06-04 DIAGNOSIS — M6281 Muscle weakness (generalized): Secondary | ICD-10-CM | POA: Diagnosis present

## 2022-06-04 DIAGNOSIS — I69354 Hemiplegia and hemiparesis following cerebral infarction affecting left non-dominant side: Secondary | ICD-10-CM

## 2022-06-04 DIAGNOSIS — R2681 Unsteadiness on feet: Secondary | ICD-10-CM | POA: Insufficient documentation

## 2022-06-04 DIAGNOSIS — I63511 Cerebral infarction due to unspecified occlusion or stenosis of right middle cerebral artery: Secondary | ICD-10-CM | POA: Insufficient documentation

## 2022-06-04 DIAGNOSIS — R278 Other lack of coordination: Secondary | ICD-10-CM

## 2022-06-04 DIAGNOSIS — G8929 Other chronic pain: Secondary | ICD-10-CM | POA: Diagnosis present

## 2022-06-04 DIAGNOSIS — M25512 Pain in left shoulder: Secondary | ICD-10-CM | POA: Insufficient documentation

## 2022-06-04 DIAGNOSIS — R41841 Cognitive communication deficit: Secondary | ICD-10-CM | POA: Diagnosis present

## 2022-06-04 DIAGNOSIS — R2689 Other abnormalities of gait and mobility: Secondary | ICD-10-CM | POA: Diagnosis present

## 2022-06-04 NOTE — Therapy (Signed)
OUTPATIENT OCCUPATIONAL THERAPY NEURO EVALUATION  Patient Name: Joseph Ortega MRN: [id number] DOB:[birth year], 67 y.o., male Today's Date: 06/04/2022  PCP: Dr. Cristie Hem REFERRING PROVIDER: Cathlyn Parsons, PA-C  END OF SESSION:  OT End of Session - 06/04/22 1205     Visit Number 1    Number of Visits 17    Date for OT Re-Evaluation 08/17/22    Authorization Type BC/BS, VL: 30 Combined, however pt reports getting MCR in April    OT Start Time 0845    OT Stop Time 0930    OT Time Calculation (min) 45 min    Activity Tolerance Patient tolerated treatment well    Behavior During Therapy [facility] for tasks assessed/performed             Past Medical History:  Diagnosis Date   Diabetes mellitus without complication ([place])    Hypertension    Thyroid disease    Past Surgical History:  Procedure Laterality Date   HERNIA REPAIR     IR ANGIO INTRA EXTRACRAN SEL COM CAROTID INNOMINATE BILAT MOD SED  03/09/2021   IR ANGIO VERTEBRAL SEL VERTEBRAL BILAT MOD SED  03/09/2021   IR US GUIDE VASC ACCESS RIGHT  03/09/2021   LOOP RECORDER INSERTION N/A 05/07/2022   Procedure: LOOP RECORDER INSERTION;  Surgeon: Vickie Epley, MD;  Location: [place] [facility];  Service: Cardiovascular;  Laterality: N/A;   Patient Active Problem List   Diagnosis Date Noted   Hyperlipidemia 05/07/2022   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   Stroke (cerebrum) ([place]) 05/02/2022   ICH (intracerebral hemorrhage) ([place]) 03/17/2021   Hypothyroidism 03/12/2021   OSA (obstructive sleep apnea) 03/12/2021   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   Uncontrolled type 2 diabetes mellitus with hyperglycemia ([place]) 03/12/2021   Redness of both eyes 03/12/2021   Morbid obesity ([place]) 03/11/2021   Essential hypertension 03/11/2021   Tobacco abuse 03/11/2021   Herpes zoster with complication    SAH (subarachnoid hemorrhage) ([place]) 03/08/2021    ONSET DATE: 05/23/2022 (referral date)   REFERRING DIAG:   I63.511 (ICD-10-CM) - Cerebral infarction due to unspecified occlusion or stenosis of right middle cerebral artery  *New stroke 05/02/22 w/ more weakness LUE  THERAPY DIAG:  Hemiplegia and hemiparesis following cerebral infarction affecting left non-dominant side (HCC)  Unsteadiness on feet  Other lack of coordination  Muscle weakness (generalized)  Rationale for Evaluation and Treatment: Rehabilitation  SUBJECTIVE:   SUBJECTIVE STATEMENT: I had another stroke. I'm filing for disability Pt accompanied by: self  PERTINENT HISTORY:  67 y.o. right-handed male with history of hypertension as well as diabetes mellitus, CVA Dec 2022 with left-sided residual weakness maintained on low-dose aspirin.  Per chart review lives with wife.  1 level home with a ramped entrance.  Wife is reportedly limited mobility.  Presented 05/02/2022 with headache and nonspecific chest pain.  Cranial CT scan showed acute hemorrhagic infarction affecting the right posterior parietal lobe.   PRECAUTIONS: Fall and Other: loop recorder, no driving  WEIGHT BEARING RESTRICTIONS: No  PAIN:  Are you having pain? No  FALLS: Has patient fallen in last 6 months? No  LIVING ENVIRONMENT: Lives with: lives with their spouse Pt lives in 1 story home with ramped entrance Has following equipment at home: Quad cane large base, Environmental consultant - 2 wheeled, Environmental consultant - 4 wheeled, Wheelchair (manual), bed side commode, Grab bars, Ramped entry, and rollator, built in shower seat  PLOF: Independent and prior to Dec 2022, working on getting long term  disability, likes to cook  PATIENT GOALS: get my Lt arm better, use a cane instead of rollator  OBJECTIVE:   HAND DOMINANCE: Right  ADLs: Eating: mod I but needs assist to cut food Grooming: mod I UB Dressing: mod I  LB Dressing: mod I  Toileting: mod I  Bathing: min assist for washing back and rinsing (seated)  Tub Shower transfers: mod I w/ walk in shower, 2 grab bars Equipment:   see above  IADLs: Shopping: dependent (wife or grandson doing)  Light housekeeping: pt does laundry but needs assist to fold Meal Prep: dependent (was max assist prior to new stroke)  Community mobility: relies on family for transportation Medication management: wife assist (pt was independent prior to new stroke)  Financial management: same as meds management Handwriting:  denies change  MOBILITY STATUS:  rollator in community, RW in house   UPPER EXTREMITY ROM:  RUE AROM WNL's.  LUE: Dominated by synergy pattern w/ min shoulder movement going into abduction w/ no distal control. However distally (isolated), pt has supination to neutral, 75% wrist ext, 75% full composite flex, and 90% full composite ext.    HAND FUNCTION: Grip strength: Right: 88.6 lbs; Left: 23.5 lbs  COORDINATION: Pt could pick up block from lower surface w/ gross grasp, but unable to do Box & Blocks d/t limited shoulder movement and distal control  SENSATION: WFL per pt report  EDEMA: none during eval, minimal at times per pt report  MUSCLE TONE: LUE: Hypertonic  COGNITION: Overall cognitive status:  memory changes since first stroke in Dec 2022, worse since new stroke  VISION: Subjective report: I lost my peripheral vision on my Lt side with this new stroke (pt had Lt lower quadrant loss from first stroke)  Baseline vision: Wears glasses for reading only Visual history: retinopathy and corrective eye surgery  VISION ASSESSMENT: Ocular ROM: WFL Tracking/Visual pursuits: Able to track stimulus in all quads without difficulty Convergence: WFL Visual Fields: Left visual field deficits   PERCEPTION: Not tested  PRAXIS: Not tested  OBSERVATIONS: new Lt visual field loss, slightly weaker LUE however presents the same as eval prior to new stroke   TODAY'S TREATMENT:                                                                                                                               Reviewed  previously issued HEP (issued 04/17/22)    PATIENT EDUCATION: Education details: OT POC Person educated: Patient Education method: Explanation Education comprehension: verbalized understanding  HOME EXERCISE PROGRAM: 04/17/22: Review of previously issued HEP (prior to new stroke - however still appropriate)    GOALS: Goals reviewed with patient? Yes  SHORT TERM GOALS: Target date: 07/05/22  Independent with initial HEP and visual scanning strategies Baseline: Goal status: IN PROGRESS  2.  Pt will verbalize understanding of A/E needs to increase safety and independence with cutting food, cooking, etc Baseline:  Goal status: INITIAL  3.  Pt will verbalize understanding with memory compensatory strategies  Baseline:  Goal status: INITIAL  4.  Pt to use Lt hand as stabilizer for bilateral tasks Baseline:  Goal status: INITIAL   LONG TERM GOALS: Target date: 08/17/22  Pt will be independent with updated HEP for LUE Baseline:  Goal status: INITIAL  2.  Pt will improve LUE function as being able to perform 5 blocks on Box & Blocks test Baseline: unable Goal status: INITIAL  3.  Pt will improve UEFS to 46% or higher Baseline: 43% on previous eval in Jan 2024 Goal status: INITIAL  4.  Pt will be able to prepare simple meal with direct supervision Baseline:  Goal status: INITIAL  5.  Pt will demo 30* sh flexion LUE for low level reaching Baseline: no true sh flex, approx 60* abduction w/ no distal control Goal status: INITIAL  6.  Pt will perform tabletop and environmental scanning safely with at least 75% accuracy Baseline:  Goal status: INITIAL  ASSESSMENT:  CLINICAL IMPRESSION: Patient is a 67 y.o. male who was seen today for occupational therapy evaluation s/p new CVA 05/02/22. Pt has residual Lt hemiplegia from stroke in Dec 2022, however pt reports more weakness LUE since new stroke. Pt also with worsened Lt visual field cut and increased memory deficits w/ new  stroke. Pt would benefit from skilled O.T. to address deficits in Lt hemiplegia, functional mobility, ADLS, and vision.   PERFORMANCE DEFICITS: in functional skills including ADLs, IADLs, coordination, dexterity, tone, ROM, pain, Fine motor control, Gross motor control, mobility, balance, body mechanics, decreased knowledge of use of DME, and UE functional use, cognitive skills including memory, and psychosocial skills including coping strategies.   IMPAIRMENTS: are limiting patient from ADLs, IADLs, leisure, and social participation.   CO-MORBIDITIES: may have co-morbidities  that affects occupational performance. Patient will benefit from skilled OT to address above impairments and improve overall function.  MODIFICATION OR ASSISTANCE TO COMPLETE EVALUATION: No modification of tasks or assist necessary to complete an evaluation.  OT OCCUPATIONAL PROFILE AND HISTORY: Problem focused assessment: Including review of records relating to presenting problem.  CLINICAL DECISION MAKING: Moderate - several treatment options, min-mod task modification necessary  REHAB POTENTIAL: Good  EVALUATION COMPLEXITY: Low    PLAN:  OT FREQUENCY: 2x/week  OT DURATION: 8 weeks (plus eval) over 10 week duration d/t scheduling conflicts  PLANNED INTERVENTIONS: self care/ADL training, therapeutic exercise, therapeutic activity, neuromuscular re-education, manual therapy, passive range of motion, functional mobility training, electrical stimulation, moist heat, patient/family education, cognitive remediation/compensation, visual/perceptual remediation/compensation, energy conservation, coping strategies training, and DME and/or AE instructions  RECOMMENDED OTHER SERVICES: none at this time  CONSULTED AND AGREED WITH PLAN OF CARE: Patient  PLAN FOR NEXT SESSION: add to HEP for LUE, visual scanning and memory strategies   Hans Eden, OT 06/04/2022, 12:11 PM

## 2022-06-04 NOTE — Therapy (Signed)
OUTPATIENT PHYSICAL THERAPY NEURO EVALUATION   Patient Name: Joseph Ortega MRN: [id number] DOB:08-Nov-1955, 67 y.o., male Today's Date: 06/04/2022   PCP: [place] PROVIDER: Cathlyn Parsons, PA-C  END OF SESSION:  PT End of Session - 06/04/22 0933     Visit Number 1    Number of Visits 17   Plus eval   Date for PT Re-Evaluation 08/06/22    Authorization Type [employer] (changes to [employer] on 4/1)    PT Start Time 0930    PT Stop Time 1005   Eval   PT Time Calculation (min) 35 min    Equipment Utilized During Treatment Gait belt    Activity Tolerance Patient tolerated treatment well    Behavior During Therapy WFL for tasks assessed/performed             Past Medical History:  Diagnosis Date   Diabetes mellitus without complication ([place])    Hypertension    Thyroid disease    Past Surgical History:  Procedure Laterality Date   HERNIA REPAIR     IR ANGIO INTRA EXTRACRAN SEL COM CAROTID INNOMINATE BILAT MOD SED  03/09/2021   IR ANGIO VERTEBRAL SEL VERTEBRAL BILAT MOD SED  03/09/2021   IR US GUIDE VASC ACCESS RIGHT  03/09/2021   LOOP RECORDER INSERTION N/A 05/07/2022   Procedure: LOOP RECORDER INSERTION;  Surgeon: Vickie Epley, MD;  Location: [place] [facility];  Service: Cardiovascular;  Laterality: N/A;   Patient Active Problem List   Diagnosis Date Noted   Hyperlipidemia 05/07/2022   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   Stroke (cerebrum) ([place]) 05/02/2022   ICH (intracerebral hemorrhage) ([place]) 03/17/2021   Hypothyroidism 03/12/2021   OSA (obstructive sleep apnea) 03/12/2021   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   Uncontrolled type 2 diabetes mellitus with hyperglycemia ([place]) 03/12/2021   Redness of both eyes 03/12/2021   Morbid obesity ([place]) 03/11/2021   Essential hypertension 03/11/2021   Tobacco abuse 03/11/2021   Herpes zoster with complication    SAH (subarachnoid hemorrhage) ([place]) 03/08/2021     ONSET DATE: 05/22/2022 (referral)  REFERRING DIAG: I63.511 (ICD-10-CM) - Cerebral infarction due to unspecified occlusion or stenosis of right middle cerebral artery  THERAPY DIAG:  Hemiplegia and hemiparesis following cerebral infarction affecting left non-dominant side (HCC)  Unsteadiness on feet  Other abnormalities of gait and mobility  Rationale for Evaluation and Treatment: Rehabilitation  SUBJECTIVE:  SUBJECTIVE STATEMENT: Pt, who is well known to this clinic, presents to clinic w/new rigid L AFO and rollator. Reports he has been working on his L hand a lot at home since leaving CIR.   Pt accompanied by: self  PERTINENT HISTORY: DM, HTN, Thyroid disease and prior CVA 12/22 with residual L side weakness. Admitted on 05/02/22 due to HA, CP, and found to have hemorrhage in the right temporoparietal region with 42mm L to R midline shift.   PAIN:  Are you having pain? No  VITALS Vitals:   06/04/22 0944  BP: (!) 142/63  Pulse: (!) 54     PRECAUTIONS: Fall  WEIGHT BEARING RESTRICTIONS: No  FALLS: Has patient fallen in last 6 months? No  LIVING ENVIRONMENT: Lives with: lives with their spouse Lives in: House/apartment Stairs: Yes: Internal: 2 steps; grab bar on R side Has following equipment at home: Environmental consultant - 4 wheeled, Shower bench, and Grab bars  PLOF: Requires assistive device for independence  PATIENT GOALS: "get to walking on a cane and get this arm working as much as I can"  OBJECTIVE:   DIAGNOSTIC FINDINGS: MRI of brain on 05/02/22   IMPRESSION: 1. Evaluation is limited by motion artifact. In addition, the MRI head could not be completed in its entirety. 2. Redemonstrated area of infarction with hemorrhage in the right temporoparietal region, which appears overall  similar to the same day CT, when allowing for differences in technique. Edema causes mass effect on the right lateral ventricle and approximately 2 mm right-to-left midline shift. 3. No intracranial large vessel occlusion. Suspect severe stenosis in the left P2, although evaluation is limited by motion. 4. No hemodynamically significant stenosis in the neck.    COGNITION: Overall cognitive status: Within functional limits for tasks assessed   SENSATION: Pt denies numbness/tingling   EDEMA: Pt on diuretic   POSTURE: rounded shoulders, forward head, increased thoracic kyphosis, and weight shift right   LOWER EXTREMITY MMT:  Tested in seated position   MMT Right Eval Left Eval  Hip flexion 4 3-  Hip extension    Hip abduction 4+ 4  Hip adduction 4+ 4-  Hip internal rotation    Hip external rotation    Knee flexion 5 4-  Knee extension 5 4-  Ankle dorsiflexion 2   Ankle plantarflexion    Ankle inversion    Ankle eversion    (Blank rows = not tested)  BED MOBILITY:  Independent per pt. "I just kinda crunch and roll up on it"   TRANSFERS: Assistive device utilized: Environmental consultant - 4 wheeled  Sit to stand: CGA Stand to sit: CGA Pt w/heavy reliance on RUE and heavy posterior bracing against chair   GAIT: Gait pattern: step to pattern, decreased stride length, decreased hip/knee flexion- Left, decreased ankle dorsiflexion- Left, lateral hip instability, lateral lean- Right, trunk flexed, wide BOS, poor foot clearance- Right, and poor foot clearance- Left Distance walked: Various clinic distances  Assistive device utilized: Walker - 4 wheeled Level of assistance: SBA Comments: 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   FUNCTIONAL TESTS:   [facility] PT Assessment - 06/04/22 0950       Transfers   Five time sit to stand comments  16.75s w/RUE support   Did not obtain full hip extension, heavy lean to R     Ambulation/Gait   Gait velocity 32.8' over 31.28s =  1.05 ft/s w/rollator   [place] ambulator, recurrent fall risk     Balance  Balance Assessed Yes      Standardized Balance Assessment   Standardized Balance Assessment Timed Up and Go Test      Timed Up and Go Test   Normal TUG (seconds) 28   With rollator              TODAY'S TREATMENT:                 Next Session                                                                                                                  PATIENT EDUCATION: Education details: POC, eval findings, plan to update HEP next session Person educated: Patient Education method: Explanation Education comprehension: verbalized understanding and needs further education  HOME EXERCISE PROGRAM: To be reviewed from previous bout of therapy  Access Code: [email or web address] URL: [email or web address] Date: 04/03/2022 Prepared by: Excell Seltzer   Exercises - Sit to Stand Without Arm Support  - 1 x daily - 7 x weekly - 3 sets - 10 reps - Mini Squat with Counter Support  - 1 x daily - 7 x weekly - 3 sets - 10 reps - Standing Hip Extension with Unilateral Counter Support  - 1 x daily - 7 x weekly - 3 sets - 10 reps - Standing Hip Abduction with Unilateral Counter Support  - 1 x daily - 7 x weekly - 3 sets - 10 reps  GOALS: Goals reviewed with patient? Yes  SHORT TERM GOALS: Target date: 07/02/2022   Pt will be independent with independent HEP for improved strength, balance, transfers and gait.  Baseline: to be reviewed from prior bout of therapy  Goal status: INITIAL  2.  Pt will improve gait velocity to at least 1.2 ft/s with LRAD for improved gait efficiency   Baseline: 1.05 ft/s with rollator  Goal status: INITIAL  3.  Pt will improve 5 x STS to less than or equal to 25 seconds w/RUE support and good form to demonstrate improved functional strength and transfer efficiency.   Baseline: 16.75s w/RUE support, not reaching full hip extension and heavy bracing against chair Goal  status: INITIAL  4.  Pt will improve normal TUG to less than or equal to 25 seconds w/LRAD for improved functional mobility and decreased fall risk.  Baseline: 28 seconds w/rollator  Goal status: INITIAL  5.  Berg to be assessed and STG/LTG updated Baseline:  Goal status: INITIAL   LONG TERM GOALS: Target date: 07/30/2022   Pt will be independent with final HEP for improved strength, balance, transfers and gait.  Baseline:  Goal status: INITIAL  2.  Pt will improve gait velocity to at least 1.5 ft/s with LRAD for improved gait efficiency   Baseline: 1.05 ft/s with rollator  Goal status: INITIAL  3.  Pt will improve 5 x STS to less than or equal to 20 seconds w/RUE support and proper form to demonstrate improved functional strength and transfer efficiency.   Baseline: 16.75s w/RUE support, not  reaching full hip extension and heavy bracing against chair Goal status: INITIAL  4.  Berg goal  Baseline:  Goal status: INITIAL  5.  Pt will improve normal TUG to less than or equal to 20 seconds w/LRAD for improved functional mobility and decreased fall risk.  Baseline: 28d w/rollator   Goal status: INITIAL   ASSESSMENT:  CLINICAL IMPRESSION: Patient is a 67 year old male referred to Neuro OPPT for R MCA CVA. Pt's PMH is significant for: DM, HTN, Thyroid disease and prior CVA 12/22 with residual L side weakness. The following deficits were present during the exam: decreased strength, impaired balance, decreased safety awareness and decreased activity tolerance. Based on gait speed, quality of 5xSTS and TUG, pt is an incr risk for falls. Pt would benefit from skilled PT to address these impairments and functional limitations to maximize functional mobility independence.    OBJECTIVE IMPAIRMENTS: Abnormal gait, decreased activity tolerance, decreased balance, decreased coordination, decreased endurance, decreased knowledge of use of DME, difficulty walking, decreased strength,  decreased safety awareness, and impaired UE functional use  ACTIVITY LIMITATIONS: carrying, lifting, bending, standing, squatting, stairs, bed mobility, reach over head, locomotion level, and caring for others  PARTICIPATION LIMITATIONS: meal prep, cleaning, laundry, medication management, interpersonal relationship, driving, shopping, community activity, occupation, and yard work  PERSONAL FACTORS: Fitness, Past/current experiences, Time since onset of injury/illness/exacerbation, Transportation, and 1-2 comorbidities: HTN and multiple CVA  are also affecting patient's functional outcome.   REHAB POTENTIAL: Fair due to multiple CVAs and lack of support at home  CLINICAL DECISION MAKING: Evolving/moderate complexity  EVALUATION COMPLEXITY: Moderate  PLAN:  PT FREQUENCY: 2x/week  PT DURATION: 8 weeks  PLANNED INTERVENTIONS: Therapeutic exercises, Therapeutic activity, Neuromuscular re-education, Balance training, Gait training, Patient/Family education, Self Care, Joint mobilization, Stair training, Orthotic/Fit training, DME instructions, Aquatic Therapy, Dry Needling, Manual therapy, and Re-evaluation  PLAN FOR NEXT SESSION: Berg and update goal. Review previous HEP and update to ensure it is safe. L NMR and global strength    Lois Slagel E Alera Quevedo, PT, DPT 06/04/2022, 10:09 AM

## 2022-06-08 ENCOUNTER — Other Ambulatory Visit: Payer: Self-pay | Admitting: Registered Nurse

## 2022-06-11 ENCOUNTER — Ambulatory Visit: Payer: BC Managed Care – PPO | Admitting: Physical Therapy

## 2022-06-11 ENCOUNTER — Ambulatory Visit (INDEPENDENT_AMBULATORY_CARE_PROVIDER_SITE_OTHER): Payer: BC Managed Care – PPO

## 2022-06-11 ENCOUNTER — Ambulatory Visit: Payer: BC Managed Care – PPO

## 2022-06-11 VITALS — BP 164/74 | HR 58

## 2022-06-11 DIAGNOSIS — I639 Cerebral infarction, unspecified: Secondary | ICD-10-CM | POA: Diagnosis not present

## 2022-06-11 DIAGNOSIS — M6281 Muscle weakness (generalized): Secondary | ICD-10-CM

## 2022-06-11 DIAGNOSIS — R2681 Unsteadiness on feet: Secondary | ICD-10-CM

## 2022-06-11 DIAGNOSIS — R2689 Other abnormalities of gait and mobility: Secondary | ICD-10-CM

## 2022-06-11 DIAGNOSIS — I69354 Hemiplegia and hemiparesis following cerebral infarction affecting left non-dominant side: Secondary | ICD-10-CM | POA: Diagnosis not present

## 2022-06-11 DIAGNOSIS — R278 Other lack of coordination: Secondary | ICD-10-CM

## 2022-06-11 DIAGNOSIS — R41841 Cognitive communication deficit: Secondary | ICD-10-CM

## 2022-06-11 LAB — CUP PACEART REMOTE DEVICE CHECK
Date Time Interrogation Session: 20240325020527
Implantable Pulse Generator Implant Date: 20240219
Pulse Gen Serial Number: 511022381

## 2022-06-11 NOTE — Therapy (Signed)
OUTPATIENT SPEECH LANGUAGE PATHOLOGY EVALUATION   Patient Name: Joseph Ortega MRN: [id number] DOB:07-20-1955, 67 y.o., male Today's Date: 06/11/2022  PCP: [place] PROVIDER: Cathlyn Parsons, PA-C  END OF SESSION:  End of Session - 06/11/22 0759     Visit Number 2    Number of Visits 17    Date for SLP Re-Evaluation 07/30/22    Authorization Type Blue Cross [facility] (reportedly changing to [facility] 06/18/22)    SLP Start Time 0800    SLP Stop Time  0845    SLP Time Calculation (min) 45 min    Activity Tolerance Patient tolerated treatment well             Past Medical History:  Diagnosis Date   Diabetes mellitus without complication ([place])    Hypertension    Thyroid disease    Past Surgical History:  Procedure Laterality Date   HERNIA REPAIR     IR ANGIO INTRA EXTRACRAN SEL COM CAROTID INNOMINATE BILAT MOD SED  03/09/2021   IR ANGIO VERTEBRAL SEL VERTEBRAL BILAT MOD SED  03/09/2021   IR US GUIDE VASC ACCESS RIGHT  03/09/2021   LOOP RECORDER INSERTION N/A 05/07/2022   Procedure: LOOP RECORDER INSERTION;  Surgeon: Vickie Epley, MD;  Location: [facility];  Service: Cardiovascular;  Laterality: N/A;   Patient Active Problem List   Diagnosis Date Noted   Hyperlipidemia 05/07/2022   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) 03/12/2021   Redness of both eyes 03/12/2021   Morbid obesity ([place]) 03/11/2021   Essential hypertension 03/11/2021   Tobacco abuse 03/11/2021   Herpes zoster with complication    SAH (subarachnoid hemorrhage) ([place]) 03/08/2021    ONSET DATE: 05/02/22   REFERRING DIAG: [id number] (ICD-10-CM) - Cerebral infarction due to unspecified occlusion or  stenosis of right middle cerebral artery  THERAPY DIAG:  Cognitive communication deficit  Rationale for Evaluation and Treatment: Rehabilitation  SUBJECTIVE:   SUBJECTIVE STATEMENT: "I'm tired. I had to catch the bus at 6 this morning." Pt accompanied by: self  PERTINENT HISTORY: Pt is a 67 y.o. male who presented with headache and chest pain. MRI brain 2/14: area of infarction with hemorrhage in the right temporoparietal region. Edema causes  mass effect on the right lateral ventricle and approximately 2 mm right-to-left midline shift. PMH: stroke with left-sided weakness.   PAIN: Are you having pain? No  FALLS: Has patient fallen in last 6 months?  No  LIVING ENVIRONMENT: Lives with: lives with their spouse Lives in: House/apartment  PLOF:  Level of assistance: Independent with ADLs, Independent with IADLs (Pt reports his wife is handicapped and before recent stroke he was assisting her; "now our roles are reversed")  Employment: Retired (last worked Nov 2022)  PATIENT GOALS: "Help with bills, take my meds"   OBJECTIVE:   [place] (PROM): Cognitive Function questionnaire (56) - Pt reports significant difficulty with recalling whether or not he has completed tasks, remembering information (such as phone numbers, instructions), and consistent difficulty with attention/focus, slow processing, multi-tasking, and these deficits negatively impact his ability to participate in ADLs, iADLS, and preferred activities. Pt endorsed he sometimes experiences anomia.   TODAY'S TREATMENT:  06/11/22: Pt reported that, including managing finances and medications, he would like to return to cooking, as there are safety concerns d/t to deficits in attention and recall (example: forgetting to turn off stove). He has been teaching his grandson  to cook and would like to be able to participate more. His wife has been managing all his medications for him (she is a retired Environmental consultant). During structured medication management task, pt demonstrated slow processing and required extra time to complete task. Pt independently identified 4/8 mistakes in pill sorting, but achieved 7/8 when given min to mod verbal cues when task became more complex. Pt benefited from occasional reminders to focus on one pill at a time and talk himself through the task aloud. SLP educated pt on importance of starting to organize his pills at home in order to improve in iADLs and regain his independence. Plan to complete medication sorting task during following session.   06/04/22: Assessment completed this session. Pt has 32 year-old grandson.    PATIENT EDUCATION: Education details: see above Person educated: Patient Education method: Explanation Education comprehension: verbalized understanding and needs further education   GOALS: Goals reviewed with patient? Yes  SHORT TERM GOALS: Target date: 07-02-22  Pt will complete PROM during upcoming session.  Baseline: 77 (Cognitive Function questionnaire) Goal status: MET  2.  Pt will demonstrate increased attention to detail in structured iADL tasks with 3 or less errors exhibited given occasional min A.  Baseline:  Goal status: IN PROGRESS  3.  Pt will teach back memory strategies with min-A. Baseline:  Goal status: IN PROGRESS  4.  Pt will carryover cognitive compensations to increase independent completion of iADLs given occasional min A in 2/2 opportunities.  Baseline:  Goal status: IN PROGRESS  5.  Pt will ID and correct any communication breakdowns that occur during ST session with occasional min-A.  Baseline:  Goal status: IN PROGRESS  LONG TERM GOALS: Target date: 07/30/2022    Pt will report subjective perception of improved cognitive function by d/c via PROM.  Baseline:  Goal status:  IN PROGRESS  2.  Pt will report successful implementation of cognitive compensations in daily life to participate in iADLs. Baseline:  Goal status: IN PROGRESS  3.  Pt will demonstrate increased independence in targeted iADL tasks given rare min-A from wife.  Baseline:  Goal status: IN PROGRESS  4.  Pt will ID and correct communication breakdowns PRN with mod-I.  Baseline:  Goal status: IN PROGRESS  ASSESSMENT:  CLINICAL IMPRESSION: Patient completes structured cognitive task today that involved identifying medication errors to address deficits in executive function attention. Pt benefits from extra processing time, taking himself through each step aloud, and focusing on one pill at a time. SLP educates pt on importance of participating in the activities at home he wishes to improve in to regain his independence. Patient continues to benefit from skilled ST services to assist pt in accomplishing iADLs and to support cognitive-communication challenges.   OBJECTIVE IMPAIRMENTS: include attention, memory, and executive functioning. These impairments are limiting patient from managing medications, managing finances, household responsibilities, and ADLs/IADLs. Factors affecting potential to achieve goals and functional outcome are co-morbidities. Patient will benefit from skilled SLP services to address above impairments and improve overall function.  REHAB POTENTIAL: Good  PLAN:  SLP FREQUENCY: 2x/week  SLP DURATION: 8 weeks  PLANNED INTERVENTIONS: Language facilitation, Cognitive reorganization, Internal/external aids, Functional tasks, SLP instruction and feedback, Compensatory strategies, and Patient/family education    [place]  Joffre Lucks, [place] 06/11/2022, 8:00 AM

## 2022-06-11 NOTE — Patient Instructions (Addendum)
Organizing pills:  Give yourself enough time Focus on one pill at a time Move the pill bottle to the side when you're finished with it Talk yourself through the steps aloud Double check your work

## 2022-06-11 NOTE — Therapy (Addendum)
OUTPATIENT PHYSICAL THERAPY NEURO TREATMENT  Patient Name: Joseph Ortega MRN: [id number] DOB:March 24, 1955, 67 y.o., male Today's Date: 06/11/2022   PCP: [place] PROVIDER: Cathlyn Parsons, PA-C  END OF SESSION:  PT End of Session - 06/11/22 0857     Visit Number 2    Number of Visits 17   Plus eval   Date for PT Re-Evaluation 08/06/22    Authorization Type Blue Cross [employer] (changes to [employer] on 4/1)    PT Start Time [phone]   pt in restroom after SLP session   PT Stop Time 0931    PT Time Calculation (min) 36 min    Equipment Utilized During Treatment Gait belt    Activity Tolerance Patient tolerated treatment well    Behavior During Therapy WFL for tasks assessed/performed              Past Medical History:  Diagnosis Date   Diabetes mellitus without complication ([place])    Hypertension    Thyroid disease    Past Surgical History:  Procedure Laterality Date   HERNIA REPAIR     IR ANGIO INTRA EXTRACRAN SEL COM CAROTID INNOMINATE BILAT MOD SED  03/09/2021   IR ANGIO VERTEBRAL SEL VERTEBRAL BILAT MOD SED  03/09/2021   IR US GUIDE VASC ACCESS RIGHT  03/09/2021   LOOP RECORDER INSERTION N/A 05/07/2022   Procedure: LOOP RECORDER INSERTION;  Surgeon: Vickie Epley, MD;  Location: [facility];  Service: Cardiovascular;  Laterality: N/A;   Patient Active Problem List   Diagnosis Date Noted   Hyperlipidemia 05/07/2022   Right middle cerebral artery stroke ([place]) 05/07/2022   Stroke (cerebrum) ([place]) 05/02/2022   ICH (intracerebral hemorrhage) ([place]) 03/17/2021   Hypothyroidism 03/12/2021   OSA (obstructive sleep apnea) 03/12/2021   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   Uncontrolled type 2 diabetes mellitus with hyperglycemia ([place]) 03/12/2021   Redness of both eyes 03/12/2021   Morbid obesity ([place]) 03/11/2021   Essential hypertension 03/11/2021   Tobacco abuse 03/11/2021   Herpes zoster with complication    SAH (subarachnoid hemorrhage)  ([place]) 03/08/2021    ONSET DATE: 05/22/2022 (referral)  REFERRING DIAG: I63.511 (ICD-10-CM) - Cerebral infarction due to unspecified occlusion or stenosis of right middle cerebral artery  THERAPY DIAG:  Hemiplegia and hemiparesis following cerebral infarction affecting left non-dominant side (HCC)  Unsteadiness on feet  Other lack of coordination  Muscle weakness (generalized)  Other abnormalities of gait and mobility  Rationale for Evaluation and Treatment: Rehabilitation  SUBJECTIVE:  SUBJECTIVE STATEMENT: No falls or acute changes since last session. Pt reports 2/10 normal joint pain today. Pt reports he has noticed mostly memory impairments and decreased strength in L hand following his most recent CVA.  Pt accompanied by: self  PERTINENT HISTORY: DM, HTN, Thyroid disease and prior CVA 12/22 with residual L side weakness. Admitted on 05/02/22 due to HA, CP, and found to have hemorrhage in the right temporoparietal region with 49mm L to R midline shift.   PAIN:  Are you having pain? No  VITALS Vitals:   06/11/22 0900 06/11/22 0902  BP: (!) 174/73 (!) 164/74  Pulse: (!) 59 (!) 58    PRECAUTIONS: Fall  WEIGHT BEARING RESTRICTIONS: No  FALLS: Has patient fallen in last 6 months? No  LIVING ENVIRONMENT: Lives with: lives with their spouse Lives in: House/apartment Stairs: Yes: Internal: 2 steps; grab bar on R side Has following equipment at home: Environmental consultant - 4 wheeled, Shower bench, and Grab bars  PLOF: Requires assistive device for independence  PATIENT GOALS: "get to walking on a cane and get this arm working as much as I can"  OBJECTIVE:   DIAGNOSTIC FINDINGS: MRI of brain on 05/02/22   IMPRESSION: 1. Evaluation is limited by motion artifact. In addition, the MRI head could not  be completed in its entirety. 2. Redemonstrated area of infarction with hemorrhage in the right temporoparietal region, which appears overall similar to the same day CT, when allowing for differences in technique. Edema causes mass effect on the right lateral ventricle and approximately 2 mm right-to-left midline shift. 3. No intracranial large vessel occlusion. Suspect severe stenosis in the left P2, although evaluation is limited by motion. 4. No hemodynamically significant stenosis in the neck.    COGNITION: Overall cognitive status: Within functional limits for tasks assessed   SENSATION: Pt denies numbness/tingling   EDEMA: Pt on diuretic   POSTURE: rounded shoulders, forward head, increased thoracic kyphosis, and weight shift right   LOWER EXTREMITY MMT:  Tested in seated position   MMT Right Eval Left Eval  Hip flexion 4 3-  Hip extension    Hip abduction 4+ 4  Hip adduction 4+ 4-  Hip internal rotation    Hip external rotation    Knee flexion 5 4-  Knee extension 5 4-  Ankle dorsiflexion 2   Ankle plantarflexion    Ankle inversion    Ankle eversion    (Blank rows = not tested)   VITALS: Vitals:   06/11/22 0900 06/11/22 0902  BP: (!) 174/73 (!) 164/74  Pulse: (!) 59 (!) 58     TODAY'S TREATMENT:                 [place] Reviewed HEP from previous POC and adjusted exercise's appropriately. See bolded below for adjusted HEP.  Gait Gait pattern: decreased hip/knee flexion- Left Distance walked: 115 ft Assistive device utilized: Quad cane large base Level of assistance: Min A Comments: step-to gait pattern; improved upright posture; some minor postural sway  PATIENT EDUCATION: Education details: updated HEP; will continue to trial gait with [facility] in therapy sessions Person educated: Patient Education method: Explanation and Handouts Education  comprehension: verbalized understanding and needs further education  HOME EXERCISE PROGRAM:  Updated from previous POC Access Code: [email or web address] URL: [email or web address] Date: 06/11/2022 Prepared by: Excell Seltzer  Exercises - Mini Squat with Counter Support  - 1 x daily - 7 x weekly - 3 sets - 10 reps - Standing Hip Extension with Unilateral Counter Support  - 1 x daily - 7 x weekly - 3 sets - 10 reps - Standing Hip Abduction with Unilateral Counter Support  - 1 x daily - 7 x weekly - 3 sets - 10 reps - Staggered Sit-to-Stand  - 1 x daily - 7 x weekly - 3 sets - 5 reps  GOALS: Goals reviewed with patient? Yes  SHORT TERM GOALS: Target date: 07/02/2022   Pt will be independent with independent HEP for improved strength, balance, transfers and gait.  Baseline: to be reviewed from prior bout of therapy  Goal status: INITIAL  2.  Pt will improve gait velocity to at least 1.2 ft/s with LRAD for improved gait efficiency   Baseline: 1.05 ft/s with rollator  Goal status: INITIAL  3.  Pt will improve 5 x STS to less than or equal to 25 seconds w/RUE support and good form to demonstrate improved functional strength and transfer efficiency.   Baseline: 16.75s w/RUE support, not reaching full hip extension and heavy bracing against chair Goal status: INITIAL  4.  Pt will improve normal TUG to less than or equal to 25 seconds w/LRAD for improved functional mobility and decreased fall risk.  Baseline: 28 seconds w/rollator  Goal status: INITIAL  5.  Berg to be assessed and STG/LTG updated Baseline:  Goal status: INITIAL   LONG TERM GOALS: Target date: 07/30/2022   Pt will be independent with final HEP for improved strength, balance, transfers and gait.  Baseline:  Goal status: INITIAL  2.  Pt will improve gait velocity to at least 1.5 ft/s with LRAD for improved gait efficiency   Baseline: 1.05 ft/s with rollator  Goal status: INITIAL  3.  Pt will improve 5 x  STS to less than or equal to 20 seconds w/RUE support and proper form to demonstrate improved functional strength and transfer efficiency.   Baseline: 16.75s w/RUE support, not reaching full hip extension and heavy bracing against chair Goal status: INITIAL  4.  Berg goal  Baseline:  Goal status: INITIAL  5.  Pt will improve normal TUG to less than or equal to 20 seconds w/LRAD for improved functional mobility and decreased fall risk.  Baseline: 28d w/rollator   Goal status: INITIAL   ASSESSMENT:  CLINICAL IMPRESSION: Session limited by pt's late arrival. Emphasis of skilled PT session on reviewing HEP from prior POC as well as trialing gait with [facility]. Adjusted HEP appropriately, see bolded below for HEP changes. Additionally, pt exhibits improved upright posture and overall good balance with use of [facility] for gait this date. Pt appears to be safer ambulating with [facility] as compared to his rollator due to impaired LUE strength and control. Will practice gait with [facility] again in future PT sessions before recommending that pt use this device at home, pt to bring in his [facility] from home next session. Continue POC.    OBJECTIVE IMPAIRMENTS: Abnormal gait, decreased activity tolerance, decreased balance, decreased coordination, decreased endurance, decreased knowledge of use of DME, difficulty walking, decreased  strength, decreased safety awareness, and impaired UE functional use  ACTIVITY LIMITATIONS: carrying, lifting, bending, standing, squatting, stairs, bed mobility, reach over head, locomotion level, and caring for others  PARTICIPATION LIMITATIONS: meal prep, cleaning, laundry, medication management, interpersonal relationship, driving, shopping, community activity, occupation, and yard work  PERSONAL FACTORS: Fitness, Past/current experiences, Time since onset of injury/illness/exacerbation, Transportation, and 1-2 comorbidities: HTN and multiple CVA  are also affecting patient's functional  outcome.   REHAB POTENTIAL: Fair due to multiple CVAs and lack of support at home  CLINICAL DECISION MAKING: Evolving/moderate complexity  EVALUATION COMPLEXITY: Moderate  PLAN:  PT FREQUENCY: 2x/week  PT DURATION: 8 weeks  PLANNED INTERVENTIONS: Therapeutic exercises, Therapeutic activity, Neuromuscular re-education, Balance training, Gait training, Patient/Family education, Self Care, Joint mobilization, Stair training, Orthotic/Fit training, DME instructions, Aquatic Therapy, Dry Needling, Manual therapy, and Re-evaluation  PLAN FOR NEXT SESSION: [place] and update goal. L NMR and global strength, trial gait again with [facility] (did pt bring his from home?)   Excell Seltzer, PT, DPT, CSRS 06/11/2022, 9:33 AM

## 2022-06-12 ENCOUNTER — Ambulatory Visit: Payer: BC Managed Care – PPO | Admitting: Physical Therapy

## 2022-06-12 ENCOUNTER — Ambulatory Visit: Payer: BC Managed Care – PPO | Admitting: Occupational Therapy

## 2022-06-12 ENCOUNTER — Ambulatory Visit: Payer: BC Managed Care – PPO | Admitting: Speech Pathology

## 2022-06-12 ENCOUNTER — Encounter: Payer: Self-pay | Admitting: Occupational Therapy

## 2022-06-12 VITALS — BP 160/69 | HR 57

## 2022-06-12 DIAGNOSIS — G8929 Other chronic pain: Secondary | ICD-10-CM

## 2022-06-12 DIAGNOSIS — I63511 Cerebral infarction due to unspecified occlusion or stenosis of right middle cerebral artery: Secondary | ICD-10-CM

## 2022-06-12 DIAGNOSIS — I69354 Hemiplegia and hemiparesis following cerebral infarction affecting left non-dominant side: Secondary | ICD-10-CM | POA: Diagnosis not present

## 2022-06-12 DIAGNOSIS — R600 Localized edema: Secondary | ICD-10-CM

## 2022-06-12 DIAGNOSIS — R2689 Other abnormalities of gait and mobility: Secondary | ICD-10-CM

## 2022-06-12 DIAGNOSIS — M6281 Muscle weakness (generalized): Secondary | ICD-10-CM

## 2022-06-12 DIAGNOSIS — R2681 Unsteadiness on feet: Secondary | ICD-10-CM

## 2022-06-12 DIAGNOSIS — R278 Other lack of coordination: Secondary | ICD-10-CM

## 2022-06-12 DIAGNOSIS — R41841 Cognitive communication deficit: Secondary | ICD-10-CM

## 2022-06-12 NOTE — Therapy (Signed)
OUTPATIENT OCCUPATIONAL THERAPY NEURO EVALUATION  Patient Name: Joseph Ortega MRN: [id number] DOB:Oct 30, 1955, 67 y.o., male Today's Date: 06/12/2022  PCP: Dr. Cristie Hem REFERRING PROVIDER: Cathlyn Parsons, PA-C  END OF SESSION:  OT End of Session - 06/12/22 1155     Visit Number 2    Number of Visits 17    Date for OT Re-Evaluation 08/17/22    Authorization Type BC/BS, VL: 30 Combined, however pt reports getting MCR in April    OT Start Time 1152    OT Stop Time 1230    OT Time Calculation (min) 38 min    Activity Tolerance Patient tolerated treatment well    Behavior During Therapy WFL for tasks assessed/performed             Past Medical History:  Diagnosis Date   Diabetes mellitus without complication ([place])    Hypertension    Thyroid disease    Past Surgical History:  Procedure Laterality Date   HERNIA REPAIR     IR ANGIO INTRA EXTRACRAN SEL COM CAROTID INNOMINATE BILAT MOD SED  03/09/2021   IR ANGIO VERTEBRAL SEL VERTEBRAL BILAT MOD SED  03/09/2021   IR US GUIDE VASC ACCESS RIGHT  03/09/2021   LOOP RECORDER INSERTION N/A 05/07/2022   Procedure: LOOP RECORDER INSERTION;  Surgeon: Vickie Epley, MD;  Location: [facility];  Service: Cardiovascular;  Laterality: N/A;   Patient Active Problem List   Diagnosis Date Noted   Hyperlipidemia 05/07/2022   Right middle cerebral artery stroke ([place]) 05/07/2022   Stroke (cerebrum) ([place]) 05/02/2022   ICH (intracerebral hemorrhage) ([place]) 03/17/2021   Hypothyroidism 03/12/2021   OSA (obstructive sleep apnea) 03/12/2021   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   Uncontrolled type 2 diabetes mellitus with hyperglycemia ([place]) 03/12/2021   Redness of both eyes 03/12/2021   Morbid obesity ([place]) 03/11/2021   Essential hypertension 03/11/2021   Tobacco abuse 03/11/2021   Herpes zoster with complication    SAH (subarachnoid hemorrhage) ([place]) 03/08/2021    ONSET DATE: 05/23/2022 (referral date)   REFERRING DIAG:   I63.511 (ICD-10-CM) - Cerebral infarction due to unspecified occlusion or stenosis of right middle cerebral artery  *New stroke 05/02/22 w/ more weakness LUE  THERAPY DIAG:  Muscle weakness (generalized)  Hemiplegia and hemiparesis following cerebral infarction affecting left non-dominant side (HCC)  Other lack of coordination  Chronic left shoulder pain  Right middle cerebral artery stroke (HCC)  Localized edema  Rationale for Evaluation and Treatment: Rehabilitation  SUBJECTIVE:   SUBJECTIVE STATEMENT: I had another stroke. I'm filing for disability Pt accompanied by: self  PERTINENT HISTORY:  67 y.o. right-handed male with history of hypertension as well as diabetes mellitus, CVA Dec 2022 with left-sided residual weakness maintained on low-dose aspirin.  Per chart review lives with wife.  1 level home with a ramped entrance.  Wife is reportedly limited mobility.  Presented 05/02/2022 with headache and nonspecific chest pain.  Cranial CT scan showed acute hemorrhagic infarction affecting the right posterior parietal lobe.   PRECAUTIONS: Fall and Other: loop recorder, no driving  WEIGHT BEARING RESTRICTIONS: No  PAIN:  Are you having pain? No  FALLS: Has patient fallen in last 6 months? No  LIVING ENVIRONMENT: Lives with: lives with their spouse Pt lives in 1 story home with ramped entrance Has following equipment at home: Quad cane large base, Environmental consultant - 2 wheeled, Environmental consultant - 4 wheeled, Wheelchair (manual), bed side commode, Grab bars, Ramped entry, and rollator, built in shower seat  PLOF:  Independent and prior to Dec 2022, working on getting long term disability, likes to cook  PATIENT GOALS: get my Lt arm better, use a cane instead of rollator  OBJECTIVE:   HAND DOMINANCE: Right  ADLs: Eating: mod I but needs assist to cut food Grooming: mod I UB Dressing: mod I  LB Dressing: mod I  Toileting: mod I  Bathing: min assist for washing back and rinsing (seated)   Tub Shower transfers: mod I w/ walk in shower, 2 grab bars Equipment:  see above  IADLs: Shopping: dependent (wife or grandson doing)  Light housekeeping: pt does laundry but needs assist to fold Meal Prep: dependent (was max assist prior to new stroke)  Community mobility: relies on family for transportation Medication management: wife assist (pt was independent prior to new stroke)  Financial management: same as meds management Handwriting:  denies change  MOBILITY STATUS:  rollator in community, RW in house   UPPER EXTREMITY ROM:  RUE AROM WNL's.  LUE: Dominated by synergy pattern w/ min shoulder movement going into abduction w/ no distal control. However distally (isolated), pt has supination to neutral, 75% wrist ext, 75% full composite flex, and 90% full composite ext.    HAND FUNCTION: Grip strength: Right: 88.6 lbs; Left: 23.5 lbs  COORDINATION: Pt could pick up block from lower surface w/ gross grasp, but unable to do Box & Blocks d/t limited shoulder movement and distal control  SENSATION: WFL per pt report  EDEMA: none during eval, minimal at times per pt report  MUSCLE TONE: LUE: Hypertonic  COGNITION: Overall cognitive status:  memory changes since first stroke in Dec 2022, worse since new stroke  VISION: Subjective report: I lost my peripheral vision on my Lt side with this new stroke (pt had Lt lower quadrant loss from first stroke)  Baseline vision: Wears glasses for reading only Visual history: retinopathy and corrective eye surgery  VISION ASSESSMENT: Ocular ROM: WFL Tracking/Visual pursuits: Able to track stimulus in all quads without difficulty Convergence: WFL Visual Fields: Left visual field deficits   PERCEPTION: Not tested  PRAXIS: Not tested  OBSERVATIONS: new Lt visual field loss, slightly weaker LUE however presents the same as eval prior to new stroke   TODAY'S TREATMENT:                                                                                                                               - Therapeutic activities completed for duration as noted below including:  OT educated pt on LUE edema reduction techniques as noted in pt instructions.  OT initiated Visual scanning and strategies as noted in pt instructions OT initiated WARM memory strategies as noted in pt instructions  PATIENT EDUCATION: Education details: LUE edema management; visual scanning and strategies; WARM memory strategies Person educated: Patient Education method: Explanation, Demonstration, and Handouts Education comprehension: verbalized understanding, returned demonstration, and needs further education  HOME EXERCISE PROGRAM: 04/17/22: Review of previously issued  HEP (prior to new stroke - however still appropriate)    GOALS: Goals reviewed with patient? Yes  SHORT TERM GOALS: Target date: 07/05/22  Independent with initial HEP and visual scanning strategies Baseline: Goal status: IN PROGRESS  2.  Pt will verbalize understanding of A/E needs to increase safety and independence with cutting food, cooking, etc Baseline:  Goal status: INITIAL  3.  Pt will verbalize understanding with memory compensatory strategies  Baseline:  Goal status: INITIAL  4.  Pt to use Lt hand as stabilizer for bilateral tasks Baseline:  Goal status: INITIAL   LONG TERM GOALS: Target date: 08/17/22  Pt will be independent with updated HEP for LUE Baseline:  Goal status: INITIAL  2.  Pt will improve LUE function as being able to perform 5 blocks on Box & Blocks test Baseline: unable Goal status: INITIAL  3.  Pt will improve UEFS to 46% or higher Baseline: 43% on previous eval in Jan 2024 Goal status: INITIAL  4.  Pt will be able to prepare simple meal with direct supervision Baseline:  Goal status: INITIAL  5.  Pt will demo 30* sh flexion LUE for low level reaching Baseline: no true sh flex, approx 60* abduction w/ no distal control Goal status:  INITIAL  6.  Pt will perform tabletop and environmental scanning safely with at least 75% accuracy Baseline:  Goal status: INITIAL  ASSESSMENT:  CLINICAL IMPRESSION: Pt would benefit from skilled O.T. to address deficits in Lt hemiplegia, functional mobility, ADLS, and vision.   PERFORMANCE DEFICITS: in functional skills including ADLs, IADLs, coordination, dexterity, tone, ROM, pain, Fine motor control, Gross motor control, mobility, balance, body mechanics, decreased knowledge of use of DME, and UE functional use, cognitive skills including memory, and psychosocial skills including coping strategies.   IMPAIRMENTS: are limiting patient from ADLs, IADLs, leisure, and social participation.   CO-MORBIDITIES: may have co-morbidities  that affects occupational performance. Patient will benefit from skilled OT to address above impairments and improve overall function.  REHAB POTENTIAL: Good  PLAN:  OT FREQUENCY: 2x/week  OT DURATION: 8 weeks (plus eval) over 10 week duration d/t scheduling conflicts  PLANNED INTERVENTIONS: self care/ADL training, therapeutic exercise, therapeutic activity, neuromuscular re-education, manual therapy, passive range of motion, functional mobility training, electrical stimulation, moist heat, patient/family education, cognitive remediation/compensation, visual/perceptual remediation/compensation, energy conservation, coping strategies training, and DME and/or AE instructions  RECOMMENDED OTHER SERVICES: none at this time  CONSULTED AND AGREED WITH PLAN OF CARE: Patient  PLAN FOR NEXT SESSION: add to HEP for LUE, review visual scanning and memory strategies   Dennis Bast, OT 06/12/2022, 1:46 PM

## 2022-06-12 NOTE — Therapy (Signed)
OUTPATIENT PHYSICAL THERAPY NEURO TREATMENT  Patient Name: Joseph Ortega MRN: [id number] DOB:[birth year], 67 y.o., male Today's Date: 06/12/2022   PCP: [facility] PROVIDER: Cathlyn Parsons, PA-C  END OF SESSION:  PT End of Session - 06/12/22 1018     Visit Number 3    Number of Visits 17   Plus eval   Date for PT Re-Evaluation 08/06/22    Authorization Type [employer] (changes to [employer] on 4/1)    PT Start Time 1017    PT Stop Time 1059    PT Time Calculation (min) 42 min    Equipment Utilized During Treatment Gait belt    Activity Tolerance Patient tolerated treatment well    Behavior During Therapy WFL for tasks assessed/performed               Past Medical History:  Diagnosis Date   Diabetes mellitus without complication ([place])    Hypertension    Thyroid disease    Past Surgical History:  Procedure Laterality Date   HERNIA REPAIR     IR ANGIO INTRA EXTRACRAN SEL COM CAROTID INNOMINATE BILAT MOD SED  03/09/2021   IR ANGIO VERTEBRAL SEL VERTEBRAL BILAT MOD SED  03/09/2021   IR US GUIDE VASC ACCESS RIGHT  03/09/2021   LOOP RECORDER INSERTION N/A 05/07/2022   Procedure: LOOP RECORDER INSERTION;  Surgeon: Vickie Epley, MD;  Location: [facility];  Service: Cardiovascular;  Laterality: N/A;   Patient Active Problem List   Diagnosis Date Noted   Hyperlipidemia 05/07/2022   Right middle cerebral artery stroke ([place]) 05/07/2022   Stroke (cerebrum) ([place]) 05/02/2022   ICH (intracerebral hemorrhage) ([place]) 03/17/2021   Hypothyroidism 03/12/2021   OSA (obstructive sleep apnea) 03/12/2021   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   Uncontrolled type 2 diabetes mellitus with hyperglycemia ([place]) 03/12/2021   Redness of both eyes 03/12/2021   Morbid obesity ([place]) 03/11/2021   Essential hypertension 03/11/2021   Tobacco abuse 03/11/2021   Herpes zoster with complication    SAH (subarachnoid hemorrhage) ([place]) 03/08/2021    ONSET  DATE: 05/22/2022 (referral)  REFERRING DIAG: I63.511 (ICD-10-CM) - Cerebral infarction due to unspecified occlusion or stenosis of right middle cerebral artery  THERAPY DIAG:  Hemiplegia and hemiparesis following cerebral infarction affecting left non-dominant side (HCC)  Unsteadiness on feet  Muscle weakness (generalized)  Other abnormalities of gait and mobility  Rationale for Evaluation and Treatment: Rehabilitation  SUBJECTIVE:  SUBJECTIVE STATEMENT: No falls or acute changes since yesterday. Did not sleep well. Brought his LBQC to clinic   Pt accompanied by: self  PERTINENT HISTORY: DM, HTN, Thyroid disease and prior CVA 12/22 with residual L side weakness. Admitted on 05/02/22 due to HA, CP, and found to have hemorrhage in the right temporoparietal region with 55mm L to R midline shift.   PAIN:  Are you having pain? No  VITALS Vitals:   06/12/22 1021 06/12/22 1035  BP: (!) 166/69 (!) 160/69  Pulse: (!) 52 (!) 57     PRECAUTIONS: Fall  WEIGHT BEARING RESTRICTIONS: No  FALLS: Has patient fallen in last 6 months? No  LIVING ENVIRONMENT: Lives with: lives with their spouse Lives in: House/apartment Stairs: Yes: Internal: 2 steps; grab bar on R side Has following equipment at home: Environmental consultant - 4 wheeled, Shower bench, and Grab bars  PLOF: Requires assistive device for independence  PATIENT GOALS: "get to walking on a cane and get this arm working as much as I can"  OBJECTIVE:   DIAGNOSTIC FINDINGS: MRI of brain on 05/02/22   IMPRESSION: 1. Evaluation is limited by motion artifact. In addition, the MRI head could not be completed in its entirety. 2. Redemonstrated area of infarction with hemorrhage in the right temporoparietal region, which appears overall similar to the same day  CT, when allowing for differences in technique. Edema causes mass effect on the right lateral ventricle and approximately 2 mm right-to-left midline shift. 3. No intracranial large vessel occlusion. Suspect severe stenosis in the left P2, although evaluation is limited by motion. 4. No hemodynamically significant stenosis in the neck.    COGNITION: Overall cognitive status: Within functional limits for tasks assessed   SENSATION: Pt denies numbness/tingling   EDEMA: Pt on diuretic   POSTURE: rounded shoulders, forward head, increased thoracic kyphosis, and weight shift right   LOWER EXTREMITY MMT:  Tested in seated position   MMT Right Eval Left Eval  Hip flexion 4 3-  Hip extension    Hip abduction 4+ 4  Hip adduction 4+ 4-  Hip internal rotation    Hip external rotation    Knee flexion 5 4-  Knee extension 5 4-  Ankle dorsiflexion 2   Ankle plantarflexion    Ankle inversion    Ankle eversion    (Blank rows = not tested)   VITALS: Vitals:   06/12/22 1021 06/12/22 1035  BP: (!) 166/69 (!) 160/69  Pulse: (!) 52 (!) 57      TODAY'S TREATMENT:                  Gait Training  Gait pattern: step to pattern, decreased hip/knee flexion- Left, lateral lean- Right, and narrow BOS Distance walked: >300' around clinic  Assistive device utilized: Quad cane large base and L AFO  (pt's own) Level of assistance: CGA and Min A Comments: Adjusted cane multiple times as pt reported feeling it was too tall.  On final adjustment, pt began to scissor BLEs, requiring min A for safety. Pt able to recognize that his balance was "off" and took short standing rest break before resuming. Unable to assess if balance impairment was due to height of cane or pt fatigue at this time, but pt does not want to adjust cane to taller height at this time. Pt does report he does not like his cane as much due to it being a bariatric cane and it is very heavy.  Gait Speed: 0.96 ft/s  Gait pattern:  step to pattern, decreased stride length, decreased hip/knee flexion- Left, decreased ankle dorsiflexion- Left, and lateral lean- Right Distance walked: 115' Assistive device utilized: Quad cane large base Level of assistance: CGA Comments: Using clinic's cane, pt much more stable, likely due to lighter weight of cane. Pt inquiring about whether he can use his LBQC at home and therapist cleared pt to use it for short household distances only if he feels stable. Pt verbalized understanding and reported he will feel safer with [facility] as his rollator is difficult to manage at home.                                                                                                  PATIENT EDUCATION: Education details: Continue HEP, using LBQC for short distances only  Person educated: Patient Education method: Consulting civil engineer, Demonstration, and Verbal cues Education comprehension: verbalized understanding and needs further education  HOME EXERCISE PROGRAM:  Updated from previous POC Access Code: [email or web address] URL: [email or web address] Date: 06/11/2022 Prepared by: Excell Seltzer  Exercises - Mini Squat with Counter Support  - 1 x daily - 7 x weekly - 3 sets - 10 reps - Standing Hip Extension with Unilateral Counter Support  - 1 x daily - 7 x weekly - 3 sets - 10 reps - Standing Hip Abduction with Unilateral Counter Support  - 1 x daily - 7 x weekly - 3 sets - 10 reps - Staggered Sit-to-Stand  - 1 x daily - 7 x weekly - 3 sets - 5 reps  GOALS: Goals reviewed with patient? Yes  SHORT TERM GOALS: Target date: 07/02/2022   Pt will be independent with independent HEP for improved strength, balance, transfers and gait.  Baseline: to be reviewed from prior bout of therapy  Goal status: INITIAL  2.  Pt will improve gait velocity to at least 1.2 ft/s with LRAD for improved gait efficiency   Baseline: 1.05 ft/s with rollator  Goal status: INITIAL  3.  Pt will improve 5 x STS to less than  or equal to 25 seconds w/RUE support and good form to demonstrate improved functional strength and transfer efficiency.   Baseline: 16.75s w/RUE support, not reaching full hip extension and heavy bracing against chair Goal status: INITIAL  4.  Pt will improve normal TUG to less than or equal to 25 seconds w/LRAD for improved functional mobility and decreased fall risk.  Baseline: 28 seconds w/rollator  Goal status: INITIAL  5.  Berg to be assessed and STG/LTG updated Baseline:  Goal status: INITIAL   LONG TERM GOALS: Target date: 07/30/2022   Pt will be independent with final HEP for improved strength, balance, transfers and gait.  Baseline:  Goal status: INITIAL  2.  Pt will improve gait velocity to at least 1.5 ft/s with LRAD for improved gait efficiency   Baseline: 1.05 ft/s with rollator  Goal status: INITIAL  3.  Pt will improve 5 x STS to less than or equal to 20 seconds w/RUE support and proper form to demonstrate improved functional strength and transfer efficiency.   Baseline: 16.75s w/RUE  support, not reaching full hip extension and heavy bracing against chair Goal status: INITIAL  4.  Berg goal  Baseline:  Goal status: INITIAL  5.  Pt will improve normal TUG to less than or equal to 20 seconds w/LRAD for improved functional mobility and decreased fall risk.  Baseline: 28d w/rollator   Goal status: INITIAL   ASSESSMENT:  CLINICAL IMPRESSION: Emphasis of skilled PT session on gait training w/LBQC. Pt's systolic BP continues to be elevated despite reports of taking all of his prescribed meds. Pt requires CGA when using LBQC over long distances, but reports he feels safer with cane compared to rollator. At this time, cleared pt to use cane for short household distances only when he feels stable. Pt verbalized understanding. Pt could benefit from obstacle navigation practice with cane to avoid tripping over legs of cane. Continue POC.     OBJECTIVE IMPAIRMENTS:  Abnormal gait, decreased activity tolerance, decreased balance, decreased coordination, decreased endurance, decreased knowledge of use of DME, difficulty walking, decreased strength, decreased safety awareness, and impaired UE functional use  ACTIVITY LIMITATIONS: carrying, lifting, bending, standing, squatting, stairs, bed mobility, reach over head, locomotion level, and caring for others  PARTICIPATION LIMITATIONS: meal prep, cleaning, laundry, medication management, interpersonal relationship, driving, shopping, community activity, occupation, and yard work  PERSONAL FACTORS: Fitness, Past/current experiences, Time since onset of injury/illness/exacerbation, Transportation, and 1-2 comorbidities: HTN and multiple CVA  are also affecting patient's functional outcome.   REHAB POTENTIAL: Fair due to multiple CVAs and lack of support at home  CLINICAL DECISION MAKING: Evolving/moderate complexity  EVALUATION COMPLEXITY: Moderate  PLAN:  PT FREQUENCY: 2x/week  PT DURATION: 8 weeks  PLANNED INTERVENTIONS: Therapeutic exercises, Therapeutic activity, Neuromuscular re-education, Balance training, Gait training, Patient/Family education, Self Care, Joint mobilization, Stair training, Orthotic/Fit training, DME instructions, Aquatic Therapy, Dry Needling, Manual therapy, and Re-evaluation  PLAN FOR NEXT SESSION: Berg and update goal. L NMR and global strength, trial gait again with [facility] (obstacles, turns)   Cruzita Lederer Azad Calame, PT, DPT 06/12/2022, 10:59 AM

## 2022-06-12 NOTE — Therapy (Signed)
OUTPATIENT SPEECH LANGUAGE PATHOLOGY TREATMENT   Patient Name: Joseph Ortega MRN: [id number] DOB:04-20-1955, 67 y.o., male Today's Date: 06/12/2022  PCP: [facility] REFERRING PROVIDER: Cathlyn Parsons, PA-C  END OF SESSION:  End of Session - 06/12/22 0930     Visit Number 3    Number of Visits 17    Date for SLP Re-Evaluation 07/30/22    Authorization Type [employer] [facility] (reportedly changing to [facility] 06/18/22)    SLP Start Time 0930    SLP Stop Time  [id number]    SLP Time Calculation (min) 45 min    Activity Tolerance Patient tolerated treatment well              Past Medical History:  Diagnosis Date   Diabetes mellitus without complication ([place])    Hypertension    Thyroid disease    Past Surgical History:  Procedure Laterality Date   HERNIA REPAIR     IR ANGIO INTRA EXTRACRAN SEL COM CAROTID INNOMINATE BILAT MOD SED  03/09/2021   IR ANGIO VERTEBRAL SEL VERTEBRAL BILAT MOD SED  03/09/2021   IR US GUIDE VASC ACCESS RIGHT  03/09/2021   LOOP RECORDER INSERTION N/A 05/07/2022   Procedure: LOOP RECORDER INSERTION;  Surgeon: Vickie Epley, MD;  Location: [facility];  Service: Cardiovascular;  Laterality: N/A;   Patient Active Problem List   Diagnosis Date Noted   Hyperlipidemia 05/07/2022   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   Stroke (cerebrum) ([place]) 05/02/2022   ICH (intracerebral hemorrhage) ([place]) 03/17/2021   Hypothyroidism 03/12/2021   OSA (obstructive sleep apnea) 03/12/2021   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   Uncontrolled type 2 diabetes mellitus with hyperglycemia ([place]) 03/12/2021   Redness of both eyes 03/12/2021   Morbid obesity ([place]) 03/11/2021   Essential hypertension 03/11/2021   Tobacco abuse 03/11/2021   Herpes zoster with complication    SAH (subarachnoid hemorrhage) ([place]) 03/08/2021    ONSET DATE: 05/02/22   REFERRING DIAG: [id number] (ICD-10-CM) - Cerebral infarction due to unspecified occlusion or  stenosis of right middle cerebral artery  THERAPY DIAG:  Cognitive communication deficit  Rationale for Evaluation and Treatment: Rehabilitation  SUBJECTIVE:   SUBJECTIVE STATEMENT: "Tired. I didn't sleep well last night"   PERTINENT HISTORY: Pt is a 67 y.o. male who presented with headache and chest pain. MRI brain 2/14: area of infarction with hemorrhage in the right temporoparietal region. Edema causes  mass effect on the right lateral ventricle and approximately 2 mm right-to-left midline shift. PMH: stroke with left-sided weakness.   PAIN: Are you having pain? No  FALLS: Has patient fallen in last 6 months?  No  LIVING ENVIRONMENT: Lives with: lives with their spouse Lives in: House/apartment  PLOF:  Level of assistance: Independent with ADLs, Independent with IADLs (Pt reports his wife is handicapped and before recent stroke he was assisting her; "now our roles are reversed")  Employment: Retired (last worked Nov 2022)  PATIENT GOALS: "Help with bills, take my meds"   OBJECTIVE:   [place] (PROM): Cognitive Function questionnaire (46) - Pt reports significant difficulty with recalling whether or not he has completed tasks, remembering information (such as phone numbers, instructions), and consistent difficulty with attention/focus, slow processing, multi-tasking, and these deficits negatively impact his ability to participate in ADLs, iADLS, and preferred activities. Pt endorsed he sometimes experiences anomia.   TODAY'S TREATMENT:  06/12/22: SLP A pt in medication chart to aid in understanding of medications and enable successful medication management. Pt recalls 85% of medications, 90% accuracy in recalling administration times and purpose. Pt able to generate steps for sorting medications with occasional mod-A for  addition of strategies to enhance likelihood of accuracy during completion. Updated HEP to check chart, med admin worksheets.   06/11/22: Pt reported that, including managing finances and medications, he would like to return to cooking, as there are safety concerns d/t to deficits in attention and recall (example: forgetting to turn off stove). He has been teaching his grandson to cook and would like to be able to participate more. His wife has been managing all his medications for him (she is a retired Environmental consultant). During structured medication management task, pt demonstrated slow processing and required extra time to complete task. Pt independently identified 4/8 mistakes in pill sorting, but achieved 7/8 when given min to mod verbal cues when task became more complex. Pt benefited from occasional reminders to focus on one pill at a time and talk himself through the task aloud. SLP educated pt on importance of starting to organize his pills at home in order to improve in iADLs and regain his independence. Plan to complete medication sorting task during following session.   06/04/22: Assessment completed this session. Pt has 26 year-old grandson.    PATIENT EDUCATION: Education details: see above Person educated: Patient Education method: Explanation Education comprehension: verbalized understanding and needs further education   GOALS: Goals reviewed with patient? Yes  SHORT TERM GOALS: Target date: 07-02-22  Pt will complete PROM during upcoming session.  Baseline: 77 (Cognitive Function questionnaire) Goal status: MET  2.  Pt will demonstrate increased attention to detail in structured iADL tasks with 3 or less errors exhibited given occasional min A.  Baseline:  Goal status: IN PROGRESS  3.  Pt will teach back memory strategies with min-A. Baseline:  Goal status: IN PROGRESS  4.  Pt will carryover cognitive compensations to increase independent completion of iADLs given  occasional min A in 2/2 opportunities.  Baseline:  Goal status: IN PROGRESS  5.  Pt will ID and correct any communication breakdowns that occur during ST session with occasional min-A.  Baseline:  Goal status: IN PROGRESS  LONG TERM GOALS: Target date: 07/30/2022    Pt will report subjective perception of improved cognitive function by d/c via PROM.  Baseline:  Goal status: IN PROGRESS  2.  Pt will report successful implementation of cognitive compensations in daily life to participate in iADLs. Baseline:  Goal status: IN PROGRESS  3.  Pt will demonstrate increased independence in targeted iADL tasks given rare min-A from wife.  Baseline:  Goal status: IN PROGRESS  4.  Pt will ID and correct communication breakdowns PRN with mod-I.  Baseline:  Goal status: IN PROGRESS  ASSESSMENT:  CLINICAL IMPRESSION: Patient completes structured cognitive task today that involved identifying medication errors to address deficits in executive function attention. Pt benefits from extra processing time, taking himself through each step aloud, and focusing on one pill at a time. SLP educates pt on importance of participating in the activities at home he wishes to improve in to regain his independence. Patient continues to benefit from skilled ST services to assist pt in accomplishing iADLs and to support cognitive-communication challenges.   OBJECTIVE IMPAIRMENTS: include attention, memory, and executive functioning. These impairments are limiting patient from managing medications, managing finances, household responsibilities, and ADLs/IADLs. Factors affecting  potential to achieve goals and functional outcome are co-morbidities. Patient will benefit from skilled SLP services to address above impairments and improve overall function.  REHAB POTENTIAL: Good  PLAN:  SLP FREQUENCY: 2x/week  SLP DURATION: 8 weeks  PLANNED INTERVENTIONS: Language facilitation, Cognitive reorganization,  Internal/external aids, Functional tasks, SLP instruction and feedback, Compensatory strategies, and Patient/family education   Su Monks, CCC-SLP 06/12/2022, 10:45 AM

## 2022-06-12 NOTE — Patient Instructions (Addendum)
Swelling Reduction  Place affected area above the level of your heart. You can use pillows for support.   Move the affected area. Think about tightening the muscles in this area and then relaxing them.   "Pet" the area from the outside of your body toward the center of your body. If it is your hand, from the tips of your fingers to the shoulder or chest. Activities to try at home to encourage visual scanning:   1. Word searches 2. Mazes 3. Puzzles 4. Card games 5. Computer games and/or searches 6. Connect-the-dots  Activities for environmental (larger) scanning:  1. With supervision, scan for items in grocery store or drugstore.  Begin with a familiar store, then progress to a new store you've never been in before. Make sure you have supervision with this.  2. With supervision, tell a family member or caregiver when it is safe to cross a street after looking all directions and any side streets. However, do NOT cross street unless family member or caregiver is with you and says it is OK   1. Look for the edge of objects (to the left and/or right) so that you make sure you are seeing all of an object 2. Turn your head when walking, scan from side to side, particularly in busy environments  Memory Compensation Strategies  Use "WARM" strategy. W= write it down A=  associate it R=  repeat it M=  make a mental picture  You can keep a Memory Notebook. Use a 3-ring notebook with sections for the following:  calendar, important names and phone numbers, medications, doctors' names/phone numbers, "to do list"/reminders, and a section to journal what you did each day  Use a calendar to write appointments down.  Write yourself a schedule for the day.  This can be placed on the calendar or in a separate section of the Memory Notebook.  Keeping a regular schedule can help memory.  Use medication organizer with sections for each day or morning/evening pills  You may need help loading  it  Keep a basket, or pegboard by the door.   Place items that you need to take out with you in the basket or on the pegboard.  You may also want to include a message board for reminders.  Use sticky notes. Place sticky notes with reminders in a place where the task is performed.  For example:  "turn off the stove" placed by the stove, "lock the door" placed on the door at eye level, "take your medications" on the bathroom mirror or by the place where you normally take your medications  Use alarms, timers, and/or a reminder app. Use while cooking to remind yourself to check on food or as a reminder to take your medicine, or as a reminder to make a call, or as a reminder to perform another task, etc.  Use a voice recorder app or small tape recorder to record important information and notes for yourself. Go back at the end of the day and listen to these.  3. Use an organized scanning pattern. It's usually easier to scan from top to bottom, and left to right (like you are reading) 4. Double check yourself 5. Use a line guide (like a blank piece of paper) or your finger when reading 6. If necessary, place brightly colored tape at end of table or work area as a reminder to always look until you see the tape.

## 2022-06-12 NOTE — Patient Instructions (Signed)
Homework:   3 medication practice sheets Double check the med chart we made today with your prescriptions Make sure that all medications are added  Make sure we have the dose # and time correct  Practice sorting meds into pill box -- so you can get back to managing your meds  1st couple of times, until you've shown that you can do it correctly, have your wife supervise and correct as needed   Steps:  Get all pill bottles out of the basket and pill box Open all the doors Pick one bottle and read the label  Put into pill box each day/time as indicated  Double check your work  Close pill bottle and put back into basket  Repeat steps 3-6 until all meds have been sorted   *if you notice you're running low on a medication, this would be a great time to put it aside to remind yourself to call in a refill.

## 2022-06-13 NOTE — Addendum Note (Signed)
Addended by: Leta Speller I on: 06/13/2022 08:47 AM   Modules accepted: Orders

## 2022-06-18 ENCOUNTER — Ambulatory Visit: Payer: Medicare Other | Admitting: Rehabilitative and Restorative Service Providers"

## 2022-06-18 ENCOUNTER — Ambulatory Visit: Payer: Medicare Other | Admitting: Physical Therapy

## 2022-06-19 ENCOUNTER — Ambulatory Visit: Payer: Medicare Other | Admitting: Physical Therapy

## 2022-06-19 ENCOUNTER — Ambulatory Visit: Payer: Medicare Other | Admitting: Speech Pathology

## 2022-06-25 ENCOUNTER — Encounter: Payer: BC Managed Care – PPO | Admitting: Occupational Therapy

## 2022-06-25 ENCOUNTER — Ambulatory Visit: Payer: BC Managed Care – PPO | Admitting: Physical Therapy

## 2022-06-26 ENCOUNTER — Ambulatory Visit: Payer: BC Managed Care – PPO | Admitting: Physical Therapy

## 2022-06-26 ENCOUNTER — Encounter: Payer: BC Managed Care – PPO | Admitting: Speech Pathology

## 2022-06-27 ENCOUNTER — Encounter: Payer: BC Managed Care – PPO | Admitting: Physical Medicine & Rehabilitation

## 2022-07-02 ENCOUNTER — Ambulatory Visit: Payer: Medicare Other | Attending: Physical Medicine & Rehabilitation

## 2022-07-02 ENCOUNTER — Encounter: Payer: Self-pay | Admitting: Physical Therapy

## 2022-07-02 ENCOUNTER — Ambulatory Visit: Payer: Medicare Other | Admitting: Physical Therapy

## 2022-07-02 NOTE — Therapy (Signed)
[facility] [address] [place], [place], [address] Phone: [phone]   Fax:  [phone]  Patient Details  Name: Joseph Ortega MRN: [id number] Date of Birth: [birth year] Referring Provider:  No ref. provider found  Encounter Date: 07/02/2022  Called pt and left VM regarding today's no-show. Informed pt of next scheduled appointment and left clinic number to call if pt is unable to make next appointment.   Jill Alexanders Krisann Mckenna, PT, DPT 07/02/2022, 9:46 AM  [facility] [address] [place], [place], [address] Phone: [phone]   Fax:  [phone]

## 2022-07-03 ENCOUNTER — Ambulatory Visit: Payer: Medicare Other | Admitting: Physical Therapy

## 2022-07-03 ENCOUNTER — Ambulatory Visit: Payer: Medicare Other | Admitting: Speech Pathology

## 2022-07-09 ENCOUNTER — Ambulatory Visit: Payer: BC Managed Care – PPO | Admitting: Physical Therapy

## 2022-07-10 ENCOUNTER — Ambulatory Visit: Payer: BC Managed Care – PPO | Admitting: Physical Therapy

## 2022-07-10 ENCOUNTER — Encounter: Payer: BC Managed Care – PPO | Admitting: Speech Pathology

## 2022-07-12 ENCOUNTER — Ambulatory Visit (INDEPENDENT_AMBULATORY_CARE_PROVIDER_SITE_OTHER): Payer: Medicare Other

## 2022-07-12 DIAGNOSIS — I639 Cerebral infarction, unspecified: Secondary | ICD-10-CM | POA: Diagnosis not present

## 2022-07-12 LAB — CUP PACEART REMOTE DEVICE CHECK
Date Time Interrogation Session: 20240425020920
Implantable Pulse Generator Implant Date: 20240219
Pulse Gen Serial Number: 511022381

## 2022-07-16 ENCOUNTER — Encounter: Payer: BC Managed Care – PPO | Admitting: Occupational Therapy

## 2022-07-16 ENCOUNTER — Ambulatory Visit: Payer: BC Managed Care – PPO | Admitting: Physical Therapy

## 2022-07-17 ENCOUNTER — Encounter: Payer: BC Managed Care – PPO | Admitting: Speech Pathology

## 2022-07-17 ENCOUNTER — Ambulatory Visit: Payer: BC Managed Care – PPO | Admitting: Physical Therapy

## 2022-07-17 ENCOUNTER — Encounter: Payer: BC Managed Care – PPO | Admitting: Occupational Therapy

## 2022-07-19 ENCOUNTER — Encounter: Payer: Self-pay | Admitting: Physical Therapy

## 2022-07-19 ENCOUNTER — Telehealth: Payer: Self-pay

## 2022-07-19 DIAGNOSIS — I63511 Cerebral infarction due to unspecified occlusion or stenosis of right middle cerebral artery: Secondary | ICD-10-CM

## 2022-07-19 NOTE — Telephone Encounter (Signed)
Patient called stating he needs a new referral for outpt ST, OT, PT since its been more than 30 days since last visit.

## 2022-07-19 NOTE — Therapy (Signed)
[facility] [address] [place], [place], [address] Phone: [phone]   Fax:  [phone]  Patient Details  Name: Joseph Ortega MRN: [id number] Date of Birth: Nov 30, 1955 Referring Provider:  No ref. provider found  Encounter Date: 07/19/2022   Patient d/c from PT services as he was unable to return within 30 days of last seen appointment due to lack of insurance coverage.   Peter Congo, PT, DPT, CSRS 07/19/2022, 10:08 AM  [facility] [address] [place], [place], [address] Phone: [phone]   Fax:  [phone]

## 2022-07-20 NOTE — Telephone Encounter (Signed)
OK. Referrals made to neuro-rehab

## 2022-07-20 NOTE — Progress Notes (Signed)
[facility] Loop Recorder 

## 2022-07-23 ENCOUNTER — Ambulatory Visit: Payer: BC Managed Care – PPO | Admitting: Physical Therapy

## 2022-07-23 ENCOUNTER — Encounter: Payer: BC Managed Care – PPO | Admitting: Occupational Therapy

## 2022-07-24 ENCOUNTER — Encounter: Payer: BC Managed Care – PPO | Admitting: Occupational Therapy

## 2022-07-24 ENCOUNTER — Encounter: Payer: Self-pay | Admitting: Occupational Therapy

## 2022-07-24 ENCOUNTER — Ambulatory Visit: Payer: BC Managed Care – PPO | Admitting: Physical Therapy

## 2022-07-24 ENCOUNTER — Ambulatory Visit: Payer: Medicare HMO | Attending: Physical Medicine & Rehabilitation | Admitting: Occupational Therapy

## 2022-07-24 DIAGNOSIS — G8929 Other chronic pain: Secondary | ICD-10-CM | POA: Diagnosis present

## 2022-07-24 DIAGNOSIS — I63511 Cerebral infarction due to unspecified occlusion or stenosis of right middle cerebral artery: Secondary | ICD-10-CM | POA: Diagnosis present

## 2022-07-24 DIAGNOSIS — R600 Localized edema: Secondary | ICD-10-CM | POA: Diagnosis present

## 2022-07-24 DIAGNOSIS — R2689 Other abnormalities of gait and mobility: Secondary | ICD-10-CM | POA: Insufficient documentation

## 2022-07-24 DIAGNOSIS — I69354 Hemiplegia and hemiparesis following cerebral infarction affecting left non-dominant side: Secondary | ICD-10-CM | POA: Diagnosis present

## 2022-07-24 DIAGNOSIS — R41841 Cognitive communication deficit: Secondary | ICD-10-CM | POA: Insufficient documentation

## 2022-07-24 DIAGNOSIS — R278 Other lack of coordination: Secondary | ICD-10-CM | POA: Diagnosis present

## 2022-07-24 DIAGNOSIS — M6281 Muscle weakness (generalized): Secondary | ICD-10-CM | POA: Insufficient documentation

## 2022-07-24 DIAGNOSIS — R2681 Unsteadiness on feet: Secondary | ICD-10-CM | POA: Insufficient documentation

## 2022-07-24 DIAGNOSIS — M25512 Pain in left shoulder: Secondary | ICD-10-CM | POA: Diagnosis present

## 2022-07-24 NOTE — Therapy (Signed)
OUTPATIENT OCCUPATIONAL THERAPY NEURO TREATMENT  Patient Name: Joseph Ortega MRN: [id number] DOB:29-Sep-1955, 67 y.o., male Today's Date: 07/24/2022  PCP: Dr. Ross Ludwig REFERRING PROVIDER: Charlton Amor, PA-C  END OF SESSION:  OT End of Session - 07/24/22 0815     Visit Number 3    Number of Visits 17    Date for OT Re-Evaluation 08/17/22    Authorization Type BC/BS, VL: 30 Combined, however pt reports getting MCR in April    OT Start Time 0815    OT Stop Time 0900    OT Time Calculation (min) 45 min    Activity Tolerance Patient tolerated treatment well    Behavior During Therapy [facility] for tasks assessed/performed             Past Medical History:  Diagnosis Date   Diabetes mellitus without complication (H[facility])    Hypertension    Thyroid disease    Past Surgical History:  Procedure Laterality Date   HERNIA REPAIR     IR ANGIO INTRA EXTRACRAN SEL COM CAROTID INNOMINATE BILAT MOD SED  03/09/2021   IR ANGIO VERTEBRAL SEL VERTEBRAL BILAT MOD SED  03/09/2021   IR US GUIDE VASC ACCESS RIGHT  03/09/2021   LOOP RECORDER INSERTION N/A 05/07/2022   Procedure: LOOP RECORDER INSERTION;  Surgeon: Lanier Prude, MD;  Location: [facility];  Service: Cardiovascular;  Laterality: N/A;   Patient Active Problem List   Diagnosis Date Noted   Hyperlipidemia 05/07/2022   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   Stroke (cerebrum) ([facility]) 05/02/2022   ICH (intracerebral hemorrhage) ([facility]) 03/17/2021   Hypothyroidism 03/12/2021   OSA (obstructive sleep apnea) 03/12/2021   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   Uncontrolled type 2 diabetes mellitus with hyperglycemia (HCC) 03/12/2021   Redness of both eyes 03/12/2021   Morbid obesity (HCC) 03/11/2021   Essential hypertension 03/11/2021   Tobacco abuse 03/11/2021   Herpes zoster with complication    SAH (subarachnoid hemorrhage) (H[facility]) 03/08/2021    ONSET DATE: 05/23/2022 (referral date)   REFERRING DIAG:   I63.511 (ICD-10-CM) - Cerebral infarction due to unspecified occlusion or stenosis of right middle cerebral artery  *New stroke 05/02/22 w/ more weakness LUE  THERAPY DIAG:  Hemiplegia and hemiparesis following cerebral infarction affecting left non-dominant side (HCC)  Muscle weakness (generalized)  Other lack of coordination  Chronic left shoulder pain  Unsteadiness on feet  Rationale for Evaluation and Treatment: Rehabilitation  SUBJECTIVE:   SUBJECTIVE STATEMENT: Denies recent falls Pt accompanied by: self  PERTINENT HISTORY:  67 y.o. right-handed male with history of hypertension as well as diabetes mellitus, CVA Dec 2022 with left-sided residual weakness maintained on low-dose aspirin.  Per chart review lives with wife.  1 level home with a ramped entrance.  Wife is reportedly limited mobility.  Presented 05/02/2022 with headache and nonspecific chest pain.  Cranial CT scan showed acute hemorrhagic infarction affecting the right posterior parietal lobe.   PRECAUTIONS: Fall and Other: loop recorder, no driving  WEIGHT BEARING RESTRICTIONS: No  PAIN:  Are you having pain? No  FALLS: Has patient fallen in last 6 months? No  LIVING ENVIRONMENT: Lives with: lives with their spouse Pt lives in 1 story home with ramped entrance Has following equipment at home: Quad cane large base, Environmental consultant - 2 wheeled, Environmental consultant - 4 wheeled, Wheelchair (manual), bed side commode, Grab bars, Ramped entry, and rollator, built in shower seat  PLOF: Independent and prior to Dec 2022, working on getting long term  disability, likes to cook  PATIENT GOALS: get my Lt arm better, use a cane instead of rollator  OBJECTIVE:   HAND DOMINANCE: Right  ADLs: Eating: mod I but needs assist to cut food Grooming: mod I UB Dressing: mod I  LB Dressing: mod I  Toileting: mod I  Bathing: min assist for washing back and rinsing (seated)  Tub Shower transfers: mod I w/ walk in shower, 2 grab bars Equipment:   see above  IADLs: Shopping: dependent (wife or grandson doing)  Light housekeeping: pt does laundry but needs assist to fold Meal Prep: dependent (was max assist prior to new stroke)  Community mobility: relies on family for transportation Medication management: wife assist (pt was independent prior to new stroke)  Financial management: same as meds management Handwriting:  denies change  MOBILITY STATUS:  rollator in community, RW in house   UPPER EXTREMITY ROM:  RUE AROM WNL's.  LUE: Dominated by synergy pattern w/ min shoulder movement going into abduction w/ no distal control. However distally (isolated), pt has supination to neutral, 75% wrist ext, 75% full composite flex, and 90% full composite ext.    HAND FUNCTION: Grip strength: Right: 88.6 lbs; Left: 23.5 lbs  COORDINATION: Pt could pick up block from lower surface w/ gross grasp, but unable to do Box & Blocks d/t limited shoulder movement and distal control  SENSATION: WFL per pt report  EDEMA: none during eval, minimal at times per pt report  MUSCLE TONE: LUE: Hypertonic  COGNITION: Overall cognitive status:  memory changes since first stroke in Dec 2022, worse since new stroke  VISION: Subjective report: I lost my peripheral vision on my Lt side with this new stroke (pt had Lt lower quadrant loss from first stroke)  Baseline vision: Wears glasses for reading only Visual history: retinopathy and corrective eye surgery  VISION ASSESSMENT: Ocular ROM: WFL Tracking/Visual pursuits: Able to track stimulus in all quads without difficulty Convergence: WFL Visual Fields: Left visual field deficits   PERCEPTION: Not tested  PRAXIS: Not tested  OBSERVATIONS: new Lt visual field loss, slightly weaker LUE however presents the same as eval prior to new stroke   TODAY'S TREATMENT:                                                                                                                                Reviewed memory strategies, visual scanning strategies, and edema management strategies at length with patient.  Issued additional HEP for LUE - see pt instructions for details. Pt performed each with min cues for correct positioning  PATIENT EDUCATION: Education details: LUE HEP, review of LUE edema management; visual scanning and strategies; WARM memory strategies Person educated: Patient Education method: Explanation, Demonstration, and Handouts Education comprehension: verbalized understanding, returned demonstration, and needs further education  HOME EXERCISE PROGRAM: 04/17/22: Review of previously issued HEP (prior to new stroke - however still appropriate)  07/24/22: LUE HEP   GOALS: Goals  reviewed with patient? Yes  SHORT TERM GOALS: Target date: 07/05/22  Independent with initial HEP and visual scanning strategies Baseline: Goal status: MET  2.  Pt will verbalize understanding of A/E needs to increase safety and independence with cutting food, cooking, etc Baseline:  Goal status: INITIAL  3.  Pt will verbalize understanding with memory compensatory strategies  Baseline:  Goal status:IN PROGRESS  4.  Pt to use Lt hand as stabilizer for bilateral tasks Baseline:  Goal status: INITIAL   LONG TERM GOALS: Target date: 08/17/22  Pt will be independent with updated HEP for LUE Baseline:  Goal status: INITIAL  2.  Pt will improve LUE function as being able to perform 5 blocks on Box & Blocks test Baseline: unable Goal status: INITIAL  3.  Pt will improve UEFS to 46% or higher Baseline: 43% on previous eval in Jan 2024 Goal status: INITIAL  4.  Pt will be able to prepare simple meal with direct supervision Baseline:  Goal status: INITIAL  5.  Pt will demo 30* sh flexion LUE for low level reaching Baseline: no true sh flex, approx 60* abduction w/ no distal control Goal status: INITIAL  6.  Pt will perform tabletop and environmental scanning safely with at least  75% accuracy Baseline:  Goal status: INITIAL  ASSESSMENT:  CLINICAL IMPRESSION: Pt will continue to benefit from skilled O.T. to address deficits in Lt hemiplegia, functional mobility, ADLS, and vision. Pt has met STG #1.   PERFORMANCE DEFICITS: in functional skills including ADLs, IADLs, coordination, dexterity, tone, ROM, pain, Fine motor control, Gross motor control, mobility, balance, body mechanics, decreased knowledge of use of DME, and UE functional use, cognitive skills including memory, and psychosocial skills including coping strategies.   IMPAIRMENTS: are limiting patient from ADLs, IADLs, leisure, and social participation.   CO-MORBIDITIES: may have co-morbidities  that affects occupational performance. Patient will benefit from skilled OT to address above impairments and improve overall function.  REHAB POTENTIAL: Good  PLAN:  OT FREQUENCY: 2x/week  OT DURATION: 8 weeks (plus eval) over 10 week duration d/t scheduling conflicts  PLANNED INTERVENTIONS: self care/ADL training, therapeutic exercise, therapeutic activity, neuromuscular re-education, manual therapy, passive range of motion, functional mobility training, electrical stimulation, moist heat, patient/family education, cognitive remediation/compensation, visual/perceptual remediation/compensation, energy conservation, coping strategies training, and DME and/or AE instructions  RECOMMENDED OTHER SERVICES: none at this time  CONSULTED AND AGREED WITH PLAN OF CARE: Patient  PLAN FOR NEXT SESSION: progress towards remaining STG's   Sheran Lawless, OT 07/24/2022, 8:16 AM

## 2022-07-24 NOTE — Patient Instructions (Addendum)
ELBOW: Extension / Chest Press (Frame)    Lie on back with knees bent. Straighten elbows to raise frame or cane. Hold _3__ seconds. _10__ reps per set, _2__ sets per day   Flexion (Passive)    Sitting upright, slide both forearms forward along table (folded towel underneath), bending from the waist until a stretch is felt. Hold _3___ seconds. Repeat _10___ times. Do __2__ sessions per day.  3. Use cane, plastic PVC pipe (2" diameter, 2.5 ft long), or small pool noodle (2.5 ft long) - hold at lap in both hands, slide off lap and down towards floor, then bring to lap and slide towards the belly button while squeezing shoulder blades together. Repeat x 10 reps, 2 times per day      Swelling Reduction   Place affected area above the level of your heart. You can use pillows for support.    Move the affected area. Think about tightening the muscles in this area and then relaxing them.    "Pet" the area from the outside of your body toward the center of your body. If it is your hand, from the tips of your fingers to the shoulder or chest. Activities to try at home to encourage visual scanning:       Memory Compensation Strategies   Use "WARM" strategy. W= write it down A=  associate it R=  repeat it M=  make a mental picture   You can keep a Memory Notebook. Use a 3-ring notebook with sections for the following:  calendar, important names and phone numbers, medications, doctors' names/phone numbers, "to do list"/reminders, and a section to journal what you did each day    Use a calendar to write appointments down.   Write yourself a schedule for the day.  This can be placed on the calendar or in a separate section of the Memory Notebook.  Keeping a regular schedule can help memory.   Use medication organizer with sections for each day or morning/evening pills  You may need help loading it    Keep a basket, or pegboard by the door.   Place items that you need to take out  with you in the basket or on the pegboard.  You may also want to include a message board for reminders.    Use sticky notes. Place sticky notes with reminders in a place where the task is performed.  For example:  "turn off the stove" placed by the stove, "lock the door" placed on the door at eye level, "take your medications" on the bathroom mirror or by the place where you normally take your medications    Use alarms, timers, and/or a reminder app. Use while cooking to remind yourself to check on food or as a reminder to take your medicine, or as a reminder to make a call, or as a reminder to perform another task, etc.    Use a voice recorder app or small tape recorder to record important information and notes for yourself. Go back at the end of the day and listen to these.                     VISUAL SCANNING STRATEGIES   1. Look for the edge of objects (to the left and/or right) so that you make sure you are seeing all of an object 2. Turn your head when walking, scan from side to side, particularly in busy environments 3. Use an organized scanning pattern. It's usually  easier to scan from top to bottom, and left to right (like you are reading) 4. Double check yourself 5. Use a line guide (like a blank piece of paper) or your finger when reading 6. If necessary, place brightly colored tape at end of table or work area as a reminder to always look until you see the tape.     1. Word searches 2. Mazes 3. Puzzles 4. Card games 5. Computer games and/or searches 6. Connect-the-dots   Activities for environmental (larger) scanning:  1. With supervision, scan for items in grocery store or drugstore.  Begin with a familiar store, then progress to a new store you've never been in before. Make sure you have supervision with this.  2. With supervision, tell a family member or caregiver when it is safe to cross a street after looking all directions and any side streets. However, do NOT  cross street unless family member or caregiver is with you and says it is OK

## 2022-07-26 ENCOUNTER — Ambulatory Visit: Payer: Medicare HMO | Admitting: Speech Pathology

## 2022-07-26 ENCOUNTER — Ambulatory Visit: Payer: Medicare HMO

## 2022-07-26 VITALS — BP 158/66

## 2022-07-26 DIAGNOSIS — M6281 Muscle weakness (generalized): Secondary | ICD-10-CM

## 2022-07-26 DIAGNOSIS — R41841 Cognitive communication deficit: Secondary | ICD-10-CM

## 2022-07-26 DIAGNOSIS — I69354 Hemiplegia and hemiparesis following cerebral infarction affecting left non-dominant side: Secondary | ICD-10-CM | POA: Diagnosis not present

## 2022-07-26 DIAGNOSIS — R2681 Unsteadiness on feet: Secondary | ICD-10-CM

## 2022-07-26 DIAGNOSIS — R2689 Other abnormalities of gait and mobility: Secondary | ICD-10-CM

## 2022-07-26 DIAGNOSIS — R278 Other lack of coordination: Secondary | ICD-10-CM

## 2022-07-26 NOTE — Patient Instructions (Signed)
Try sorting your medication box for next week -- Joseph Ortega should watch to make sure it's done correctly  Get out all your medications and your pill box Set all medication bottles to one side Pick one, read the instructions, then sort into the box accordingly  Scan after you think your done and fix and mistakes  Put finished bottle back in basket  Repeat steps 3-5 until finished   Make sure you have plenty of time to complete Work in a clean space  Make sure that you scan to make sure no pills are left out Poor the pills onto a plate or into a bowl to make it a little easier for you   Can be helpful to label the tops (use duct tape and sharpie) of bottles with direct, simple instructions  "Take two tablet two times a day" : 2 pills AM, 2 pills PM  "Take 1 tablet three times a day" : 1 pill AM, 1 pill noon, 1 pill PM   Once it's sorted currently take a picture so you have a visual aid of what it's supposed to look like  OR Create a "pill key" draw your daily box (two boxes) and tape the pills to the paper in the correct boxes to guide you

## 2022-07-26 NOTE — Therapy (Signed)
OUTPATIENT SPEECH LANGUAGE PATHOLOGY TREATMENT (RECERT)   Patient Name: Joseph Ortega MRN: [id number] DOB:[birth year], 67 y.o., male Today's Date: 07/26/2022  PCP: [facility] REFERRING PROVIDER: Charlton Amor, PA-C  END OF SESSION:  End of Session - 07/26/22 0758     Visit Number 4    Number of Visits 17    Date for SLP Re-Evaluation 09/06/22   +6 week for recert   Authorization Type Medicare    SLP Start Time 0845    SLP Stop Time  0930    SLP Time Calculation (min) 45 min    Activity Tolerance Patient tolerated treatment well              Past Medical History:  Diagnosis Date   Diabetes mellitus without complication (HCC)    Hypertension    Thyroid disease    Past Surgical History:  Procedure Laterality Date   HERNIA REPAIR     IR ANGIO INTRA EXTRACRAN SEL COM CAROTID INNOMINATE BILAT MOD SED  03/09/2021   IR ANGIO VERTEBRAL SEL VERTEBRAL BILAT MOD SED  03/09/2021   IR US GUIDE VASC ACCESS RIGHT  03/09/2021   LOOP RECORDER INSERTION N/A 05/07/2022   Procedure: LOOP RECORDER INSERTION;  Surgeon: Lanier Prude, MD;  Location: [facility];  Service: Cardiovascular;  Laterality: N/A;   Patient Active Problem List   Diagnosis Date Noted   Hyperlipidemia 05/07/2022   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   Stroke (cerebrum) (HCC) 05/02/2022   ICH (intracerebral hemorrhage) (HCC) 03/17/2021   Hypothyroidism 03/12/2021   OSA (obstructive sleep apnea) 03/12/2021   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 I didn't sleep well last night"   PERTINENT HISTORY: Pt is a 66 y.o. male who presented with headache and chest pain. MRI brain 2/14: area of infarction with hemorrhage in the right temporoparietal region. Edema causes  mass effect on the right lateral ventricle and approximately 2 mm right-to-left midline shift. PMH: stroke with left-sided weakness.   PAIN: Are you having pain? No  FALLS: Has patient fallen in last 6 months?  No  LIVING ENVIRONMENT: Lives with: lives with their spouse Lives in: House/apartment  PLOF:  Level of assistance: Independent with ADLs, Independent with IADLs (Pt reports his wife is handicapped and before recent stroke he was assisting her; "now our roles are reversed")  Employment: Retired (last worked Nov 2022)  PATIENT GOALS: "Help with bills, take my meds"   OBJECTIVE:   PATIENT REPORTED OUTCOME MEASURES (PROM): Cognitive Function questionnaire (77) - Pt reports significant difficulty with recalling whether or not he has completed tasks, remembering information (such as phone numbers, instructions), and consistent difficulty with attention/focus, slow processing, multi-tasking, and these deficits negatively impact his ability to participate in ADLs, iADLS, and preferred activities. Pt endorsed he sometimes experiences anomia.   TODAY'S TREATMENT:  07/26/22: Reviewed strategies and compensations for sorting medications into pill box, reminded pt of recommendation to use pill box to increase accuracy of medication management as he returns to independent administration. Pt able to complete task with ~50% accuracy, demonstrating challenges understanding temporal  instructions (e.g. take 3x a day needing to go into AM, noon, PM boxes across the week). Mod-I in implementing double check following sorting with x2 instances of error identification. Able to correct missed opportunities with mod-A from SLP. Requires A to implement strategies aimed at supporting organization during task to aid in accurate sorting.   06/12/22: SLP A pt in medication chart to aid in understanding of medications and enable successful medication management. Pt recalls 85% of medications, 90% accuracy in recalling administration times and purpose. Pt able to generate steps for sorting medications with occasional mod-A for addition of strategies to enhance likelihood of accuracy during completion. Updated HEP to check chart, med admin worksheets.   06/11/22: Pt reported that, including managing finances and medications, he would like to return to cooking, as there are safety concerns d/t to deficits in attention and recall (example: forgetting to turn off stove). He has been teaching his grandson to cook and would like to be able to participate more. His wife has been managing all his medications for him (she is a retired Programmer, systems). During structured medication management task, pt demonstrated slow processing and required extra time to complete task. Pt independently identified 4/8 mistakes in pill sorting, but achieved 7/8 when given min to mod verbal cues when task became more complex. Pt benefited from occasional reminders to focus on one pill at a time and talk himself through the task aloud. SLP educated pt on importance of starting to organize his pills at home in order to improve in iADLs and regain his independence. Plan to complete medication sorting task during following session.   06/04/22: Assessment completed this session. Pt has 63 year-old grandson.    PATIENT EDUCATION: Education details: see above Person educated: Patient Education method: Explanation Education  comprehension: verbalized understanding and needs further education   GOALS: Goals reviewed with patient? Yes  SHORT TERM GOALS: Target date:  08/23/2022 (extended at recert)  Pt will complete PROM during upcoming session.  Baseline: 77 (Cognitive Function questionnaire) Goal status: MET  2.  Pt will demonstrate increased attention to detail in structured iADL tasks with 3 or less errors exhibited given occasional min A.  Baseline:  Goal status: IN PROGRESS  3.  Pt will teach back memory strategies with min-A. Baseline:  Goal status: IN PROGRESS  4.  Pt will carryover cognitive compensations to increase independent completion of iADLs given occasional min A in 2/2 opportunities.  Baseline:  Goal status: IN PROGRESS  5.  Pt will ID and correct any communication breakdowns that occur during ST session with occasional min-A.  Baseline:  Goal status: IN PROGRESS  LONG TERM GOALS: Target date: 09/06/2022 (new date at recert)    Pt will report subjective perception of improved cognitive function by d/c via PROM.  Baseline:  Goal status: IN PROGRESS  2.  Pt will report successful implementation of cognitive compensations in daily life to participate in iADLs. Baseline:  Goal status: IN PROGRESS  3.  Pt will demonstrate increased independence in targeted iADL tasks given rare min-A from wife.  Baseline:  Goal status: IN PROGRESS  4.  Pt will ID and correct communication breakdowns PRN with mod-I.  Baseline:  Goal status: IN PROGRESS  ASSESSMENT:  CLINICAL IMPRESSION: Skilled interventions focused on medication management this date. Recert completed, as pt unable to come to therapy for a few weeks as he transitioned to Medicare. Skilled ST is still indicated based on pt report of challenges at home. Reviewed responses on initial PROM to update current status. Extended dates on goals, as pt was unable to attend therapy, though goals still appear appropriate to A pt in improved  participation and confidence in home based activities and iADLs. Continued skilled ST services are recommended to assist pt in accomplishing iADLs and to support cognitive-communication challenges.   OBJECTIVE IMPAIRMENTS: include attention, memory, and executive functioning. These impairments are limiting patient from managing medications, managing finances, household responsibilities, and ADLs/IADLs. Factors affecting potential to achieve goals and functional outcome are co-morbidities. Patient will benefit from skilled SLP services to address above impairments and improve overall function.  REHAB POTENTIAL: Good  PLAN:  SLP FREQUENCY: 2x/week  SLP DURATION: 8 weeks (+6 weeks at recert)   PLANNED INTERVENTIONS: Language facilitation, Cognitive reorganization, Internal/external aids, Functional tasks, SLP instruction and feedback, Compensatory strategies, and Patient/family education   Maia Breslow, CCC-SLP 07/26/2022, 8:00 AM

## 2022-07-26 NOTE — Therapy (Signed)
OUTPATIENT PHYSICAL THERAPY NEURO EVALUATION   Patient Name: Joseph Ortega MRN: [id number] DOB:15-Mar-1956, 67 y.o., male Today's Date: 07/26/2022   PCP: [facility] REFERRING PROVIDER: Ranelle Oyster, MD   END OF SESSION:  PT End of Session - 07/26/22 0754     Visit Number 1    Number of Visits 13    Date for PT Re-Evaluation 10/18/22    Authorization Type [employer]    Progress Note Due on Visit 10    PT Start Time 0932   received from ST   PT Stop Time 1013    PT Time Calculation (min) 41 min    Equipment Utilized During Treatment Gait belt    Activity Tolerance Patient tolerated treatment well    Behavior During Therapy WFL for tasks assessed/performed             Past Medical History:  Diagnosis Date   Diabetes mellitus without complication (HCC)    Hypertension    Thyroid disease    Past Surgical History:  Procedure Laterality Date   HERNIA REPAIR     IR ANGIO INTRA EXTRACRAN SEL COM CAROTID INNOMINATE BILAT MOD SED  03/09/2021   IR ANGIO VERTEBRAL SEL VERTEBRAL BILAT MOD SED  03/09/2021   IR US GUIDE VASC ACCESS RIGHT  03/09/2021   LOOP RECORDER INSERTION N/A 05/07/2022   Procedure: LOOP RECORDER INSERTION;  Surgeon: Lanier Prude, MD;  Location: [facility];  Service: Cardiovascular;  Laterality: N/A;   Patient Active Problem List   Diagnosis Date Noted   Hyperlipidemia 05/07/2022   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   Stroke (cerebrum) (HCC) 05/02/2022   ICH (intracerebral hemorrhage) (HCC) 03/17/2021   Hypothyroidism 03/12/2021   OSA (obstructive sleep apnea) 03/12/2021   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   Uncontrolled type 2 diabetes mellitus with hyperglycemia (HCC) 03/12/2021   Redness of both eyes 03/12/2021   Morbid obesity (HCC) 03/11/2021   Essential hypertension 03/11/2021   Tobacco abuse 03/11/2021   Herpes zoster with complication    SAH (subarachnoid hemorrhage) (HCC) 03/08/2021    ONSET  DATE:   07/20/2022  referral  REFERRING DIAG: W09.811 (ICD-10-CM) - 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 Reports doing well over the past month. Uses a quad cane for shorter household distances. Denies falls/near falls.   Pt accompanied by: self  PERTINENT HISTORY: DM, HTN, Thyroid disease and prior CVA 12/22 with residual L side weakness. Admitted on 05/02/22 due to HA, CP, and found to have hemorrhage in the right temporoparietal region with 2mm L to R midline shift.   PAIN:  Are you having pain? No  VITALS Vitals:   07/26/22 0937  BP: (!) 158/66      PRECAUTIONS: Fall  WEIGHT BEARING RESTRICTIONS: No  FALLS: Has patient fallen in last 6 months? No  LIVING ENVIRONMENT: Lives with: lives with their spouse Lives in: House/apartment Stairs: Yes: Internal: 2 steps; grab bar on R side Has following equipment at home: Chiropractor, Environmental consultant - 4 wheeled, Shower bench, and Grab bars  PLOF: Requires assistive device for independence  PATIENT GOALS: "get to walking on a cane and get this arm working as much as I can"  OBJECTIVE:   DIAGNOSTIC FINDINGS: MRI of brain on 05/02/22   IMPRESSION: 1. Evaluation is limited by motion artifact. In addition, the MRI head could not be completed in its entirety. 2.  Redemonstrated area of infarction with hemorrhage in the right temporoparietal region, which appears overall similar to the same day CT, when allowing for differences in technique. Edema causes mass effect on the right lateral ventricle and approximately 2 mm right-to-left midline shift. 3. No intracranial large vessel occlusion. Suspect severe stenosis in the left P2, although evaluation is limited by motion. 4. No hemodynamically significant stenosis in the neck.    COGNITION: Overall cognitive status: Within functional limits for tasks assessed   SENSATION: Pt denies numbness/tingling   EDEMA: Pt on diuretic   POSTURE: rounded shoulders, forward head, increased thoracic kyphosis, and weight shift right   LOWER EXTREMITY MMT:  Tested in seated position   MMT Right Eval Left Eval  Hip flexion 4 3-  Hip extension    Hip abduction 4+ 4  Hip adduction 4+ 4-  Hip internal rotation    Hip external rotation    Knee flexion 5 4-  Knee extension 5 4-  Ankle dorsiflexion 2   Ankle plantarflexion    Ankle inversion    Ankle eversion    (Blank rows = not tested)  BED MOBILITY:  Independent per pt. "I just kinda crunch and roll up on it"   TRANSFERS: Assistive device utilized: Environmental consultant - 4 wheeled  Sit to stand: CGA Stand to sit: CGA Pt w/heavy reliance on RUE and heavy posterior bracing against chair   GAIT: Gait pattern: step to pattern, decreased stride length, decreased hip/knee flexion- Left, decreased ankle dorsiflexion- Left, lateral hip instability, lateral lean- Right, trunk flexed, wide BOS, poor foot clearance- Right, and poor foot clearance- Left Distance walked: Various clinic distances  Assistive device utilized: Walker - 4 wheeled Level of assistance: SBA Comments: 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   FUNCTIONAL TESTS:   [facility] PT Assessment - 07/26/22 0001       Standardized Balance Assessment   Standardized Balance Assessment 10  meter walk test    Five times sit to stand comments  20.97    10 Meter Walk .97m/s      Berg Balance Test   Sit to Stand Able to stand using hands after several tries    Standing Unsupported Able to stand 2  minutes with supervision    Sitting with Back Unsupported but Feet Supported on Floor or Stool Able to sit safely and securely 2 minutes    Stand to Sit Controls descent by using hands    Transfers Able to transfer with verbal cueing and /or supervision    Standing Unsupported with Eyes Closed Able to stand 3 seconds    Standing Unsupported with Feet Together Able to place feet together independently but unable to hold for 30 seconds    From Standing, Reach Forward with Outstretched Arm Reaches forward but needs supervision    From Standing Position, Pick up Object from Floor Unable to try/needs assist to keep balance    From Standing Position, Turn to Look Behind Over each Shoulder Needs supervision when turning    Turn 360 Degrees Needs assistance while turning    Standing Unsupported, Alternately Place Feet on Step/Stool Needs assistance to keep from falling or unable to try    Standing Unsupported, One Foot in [employer] balance while stepping or standing    Standing on One Leg Unable to try or needs assist to prevent fall    Total Score 20    Berg comment: high fall risk      Timed Up and Go Test   Normal TUG (seconds) 33.18                TODAY'S TREATMENT:                 NMR  -hooklying bridges with emphasis on equal hip clearance/engagement of L glute/posterior chain  -hooklying marching L LE, added 3# ankle weight for improved proprioceptive input and challenge  -noted tendency toward ER at the hip  -seated hip flex over dumbbell with 3# ankle weight  -progressed to hip flex over kettlebell                                                                                                                  PATIENT EDUCATION: Education details: POC, eval findings,  plan to update HEP next session Person educated: Patient Education method: Explanation Education comprehension: verbalized understanding and needs further education  HOME EXERCISE PROGRAM: To be reviewed from previous bout of therapy  Access Code: [email or web address] URL: [email or web address] Date: 04/03/2022 Prepared by: Peter Congo   Exercises - Sit to Stand Without Arm Support  - 1 x daily - 7 x weekly - 3 sets - 10 reps - Mini Squat with Counter Support  - 1 x daily - 7 x weekly - 3 sets - 10 reps - Standing Hip Extension with Unilateral Counter Support  - 1 x daily - 7 x weekly - 3 sets - 10 reps - Standing Hip Abduction with Unilateral Counter Support  - 1 x daily - 7 x weekly - 3 sets - 10 reps  GOALS:  Goals reviewed with patient? Yes   SHORT TERM GOALS: Target date: 08/24/22    1. Pt will be independent with independent HEP for improved strength, balance, transfers  and gait.  Baseline: to be reviewed from prior bout of therapy  Goal status: INITIAL   2.  Pt will improve gait velocity to at least .63m/s with LRAD for improved gait efficiency   Baseline: .69m/s  Goal status: INITIAL   3.  Pt will improve 5 x STS to less than or equal to 15 seconds w/RUE support and good form to demonstrate improved functional strength and transfer efficiency.   Baseline:  20.97s with R UE  Goal status: INITIAL   4.  Pt will improve normal TUG to less than or equal to 27 seconds w/LRAD for improved functional mobility and decreased fall risk.   Baseline: 33.18s with rollator  Goal status: INITIAL   5.  Patient will improve BBS score to >/= 27/56 to demonstrate improved balance  Baseline: 20/56  Goal status: INITIAL    LONG TERM GOALS: Target date: 09/21/22    1. Pt will be independent with final HEP for improved strength, balance, transfers and gait.   Baseline:  Goal status: INITIAL   2.  Pt will improve gait velocity to at least .39m/s with LRAD for improved gait  efficiency   Baseline: .34m/s  Goal status: INITIAL   3.  Pt will improve 5 x STS to less than or equal to 12 seconds w/RUE support and proper form to demonstrate improved functional strength and transfer efficiency.   Baseline: 20.97s with R UE  Goal status: INITIAL   4.  Patient will improve BBS score to >/=36/56 to demonstrate improved balance  Baseline: 20/56  Goal status: INITIAL   5.  Pt will improve normal TUG to less than or equal to 23 seconds w/LRAD for improved functional mobility and decreased fall risk.   Baseline: 33.18s with rollator  Goal status: INITIAL    ASSESSMENT:   CLINICAL IMPRESSION:  Patient is a 67 yo male seen for skilled PT evaluation s/p CVA. Patient demonstrated increased fall risk noted by score of 20/56 on the [facility] Scale.  <45/56 = fall risk, <42/56 = predictive of recurrent falls, <40/56 = 100% fall risk  >41 = independent, 21-40 = assistive device, 0-20 = wheelchair level  MDC 6.9 (4 pts 45-56, 5 pts 35-44, 7 pts 25-34)  (ANPTA Core Set of Outcome Measures for Adults with Neurologic Conditions, 2018). Patient completed the Timed Up and Go test (TUG) in 33.18 seconds.  Geriatrics: need for further assessment of fall risk: = 12 sec; Recurrent falls: > 15 sec; Vestibular Disorders fall risk: > 15 sec; Parkinson's Disease fall risk: > 16 sec  ([email or web address], 2023). 10 Meter Walk Test:  Patient instructed to walk 10 meters (32.8 ft) as quickly and as safely as possible at their normal speed x2 and at a fast speed x2. Time measured from 2 meter mark to 8 meter mark to accommodate ramp-up and ramp-down.  Normal speed: .50m/s  Cut off scores: <0.4 m/s = household Ambulator, 0.4-0.8 m/s = limited community Ambulator, >0.8 m/s = community Ambulator, >1.2 m/s = crossing a street, <1.0 = increased fall risk  MCID 0.05 m/s (small), 0.13 m/s (moderate), 0.06 m/s (significant)  (ANPTA Core Set of Outcome Measures for Adults with Neurologic Conditions, 2018).  Five times Sit to Stand Test (FTSS)  Method:  Use a straight back chair with a solid seat that is 17-18" high. Ask participant to sit on the chair with arms folded across their chest.   Instructions:  "Stand up and sit down as quickly as possible 5 times,  keeping your arms folded across your chest."   Measurement:  Stop timing when the participant touches the chair in sitting the 5th time.   TIME: 20.97 sec   Cut off scores indicative of increased fall risk: >12 sec CVA, >16 sec PD, >13 sec vestibular  (ANPTA Core Set of Outcome Measures for Adults with Neurologic Conditions, 2018). Patient with decreased L posterior chain engagement noted during NMR tasks. Patient would benefit from skilled PT services to address the above mentioned deficits.    OBJECTIVE IMPAIRMENTS: Abnormal gait, decreased activity tolerance, decreased balance, decreased coordination, decreased endurance, decreased knowledge of use of DME, difficulty walking, decreased strength, decreased safety awareness, and impaired UE functional use   ACTIVITY LIMITATIONS: carrying, lifting, bending, standing, squatting, stairs, bed mobility, reach over head, locomotion level, and caring for others   PARTICIPATION LIMITATIONS: meal prep, cleaning, laundry, medication management, interpersonal relationship, driving, shopping, community activity, occupation, and yard work   PERSONAL FACTORS: Fitness, Past/current experiences, Time since onset of injury/illness/exacerbation, Transportation, and 1-2 comorbidities: HTN and multiple CVA  are also affecting patient's functional outcome.   REHAB POTENTIAL: Fair due to multiple CVAs and lack of support at home   CLINICAL DECISION MAKING: Evolving/moderate complexity   EVALUATION COMPLEXITY: Moderate   PLAN:   PT FREQUENCY: 1x/week per patient request   PT DURATION: 12 weeks   PLANNED INTERVENTIONS: Therapeutic exercises, Therapeutic activity, Neuromuscular re-education, Balance  training, Gait training, Patient/Family education, Self Care, Joint mobilization, Stair training, Orthotic/Fit training, DME instructions, Aquatic Therapy, Dry Needling, Manual therapy, and Re-evaluation   PLAN FOR NEXT SESSION: Review previous HEP and update to ensure it is safe. L NMR and global strength   Westley Foots, PT, DPT, CBIS  07/26/2022, 11:41 AM

## 2022-07-30 ENCOUNTER — Ambulatory Visit: Payer: BC Managed Care – PPO | Admitting: Physical Therapy

## 2022-07-30 ENCOUNTER — Encounter: Payer: BC Managed Care – PPO | Admitting: Occupational Therapy

## 2022-07-31 ENCOUNTER — Encounter: Payer: BC Managed Care – PPO | Admitting: Occupational Therapy

## 2022-07-31 ENCOUNTER — Ambulatory Visit: Payer: BC Managed Care – PPO | Admitting: Physical Therapy

## 2022-08-01 ENCOUNTER — Ambulatory Visit: Payer: Medicare HMO | Admitting: Occupational Therapy

## 2022-08-01 ENCOUNTER — Ambulatory Visit: Payer: Medicare HMO

## 2022-08-01 ENCOUNTER — Ambulatory Visit: Payer: Medicare HMO | Admitting: Speech Pathology

## 2022-08-01 VITALS — BP 152/62

## 2022-08-01 DIAGNOSIS — R2681 Unsteadiness on feet: Secondary | ICD-10-CM

## 2022-08-01 DIAGNOSIS — I69354 Hemiplegia and hemiparesis following cerebral infarction affecting left non-dominant side: Secondary | ICD-10-CM | POA: Diagnosis not present

## 2022-08-01 DIAGNOSIS — I63511 Cerebral infarction due to unspecified occlusion or stenosis of right middle cerebral artery: Secondary | ICD-10-CM

## 2022-08-01 DIAGNOSIS — R2689 Other abnormalities of gait and mobility: Secondary | ICD-10-CM

## 2022-08-01 DIAGNOSIS — R278 Other lack of coordination: Secondary | ICD-10-CM

## 2022-08-01 DIAGNOSIS — M6281 Muscle weakness (generalized): Secondary | ICD-10-CM

## 2022-08-01 DIAGNOSIS — G8929 Other chronic pain: Secondary | ICD-10-CM

## 2022-08-01 DIAGNOSIS — R600 Localized edema: Secondary | ICD-10-CM

## 2022-08-01 DIAGNOSIS — R41841 Cognitive communication deficit: Secondary | ICD-10-CM

## 2022-08-01 NOTE — Therapy (Signed)
OUTPATIENT SPEECH LANGUAGE PATHOLOGY TREATMENT (RECERT)   Patient Name: Joseph Ortega MRN: [id number] DOB:1955-11-14, 67 y.o., male Today's Date: 08/01/2022  PCP: [facility] REFERRING PROVIDER: Charlton Amor, PA-C  END OF SESSION:  End of Session - 08/01/22 1344     Visit Number 5    Number of Visits 17    Date for SLP Re-Evaluation 09/06/22   +6 week for recert   Authorization Type Medicare    SLP Start Time 1344    SLP Stop Time  1429    SLP Time Calculation (min) 45 min    Activity Tolerance Patient tolerated treatment well               Past Medical History:  Diagnosis Date   Diabetes mellitus without complication (HCC)    Hypertension    Thyroid disease    Past Surgical History:  Procedure Laterality Date   HERNIA REPAIR     IR ANGIO INTRA EXTRACRAN SEL COM CAROTID INNOMINATE BILAT MOD SED  03/09/2021   IR ANGIO VERTEBRAL SEL VERTEBRAL BILAT MOD SED  03/09/2021   IR US GUIDE VASC ACCESS RIGHT  03/09/2021   LOOP RECORDER INSERTION N/A 05/07/2022   Procedure: LOOP RECORDER INSERTION;  Surgeon: Lanier Prude, MD;  Location: [facility];  Service: Cardiovascular;  Laterality: N/A;   Patient Active Problem List   Diagnosis Date Noted   Hyperlipidemia 05/07/2022   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   Stroke (cerebrum) (HCC) 05/02/2022   ICH (intracerebral hemorrhage) (HCC) 03/17/2021   Hypothyroidism 03/12/2021   OSA (obstructive sleep apnea) 03/12/2021   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   Uncontrolled type 2 diabetes mellitus with hyperglycemia (HCC) 03/12/2021   Redness of both eyes 03/12/2021   Morbid obesity (HCC) 03/11/2021   Essential hypertension 03/11/2021   Tobacco abuse 03/11/2021   Herpes zoster with complication    SAH (subarachnoid hemorrhage) (HCC) 03/08/2021    ONSET DATE: 05/02/22   REFERRING DIAG: Y78.295 (ICD-10-CM) - Cerebral infarction due to unspecified occlusion or stenosis of right middle  cerebral artery  THERAPY DIAG:  No diagnosis found.  Rationale for Evaluation and Treatment: Rehabilitation  SUBJECTIVE:   SUBJECTIVE STATEMENT: Pt mentions that spouse was not feeling well this past week, so they were unable to complete HEP. Pt states that he is able to help around the house and wants to continue to improve. He struggles with continued communication with disability and social security. He is able to complete the required paperwork, but frustrated with the process.   PERTINENT HISTORY: Pt is a 67 y.o. male who presented with headache and chest pain. MRI brain 2/14: area of infarction with hemorrhage in the right temporoparietal region. Edema causes mass effect on the right lateral ventricle and approximately 2 mm right-to-left midline shift. PMH: stroke with left-sided weakness.   PAIN: Are you having pain? No  FALLS: Has patient fallen in last 6 months?  No  LIVING ENVIRONMENT: Lives with: lives with their spouse Lives in: House/apartment  PLOF:  Level of assistance: Independent with ADLs, Independent with IADLs (Pt reports his wife is handicapped and before recent stroke he was assisting her; "now our roles are reversed")  Employment: Retired (last worked Nov 2022)  PATIENT GOALS: "Help with bills, take my meds"   OBJECTIVE:   PATIENT REPORTED OUTCOME MEASURES (PROM): Cognitive Function questionnaire (77) - Pt reports significant difficulty with recalling whether or not he has completed tasks, remembering information (such as phone numbers, instructions), and  consistent difficulty with attention/focus, slow processing, multi-tasking, and these deficits negatively impact his ability to participate in ADLs, iADLS, and preferred activities. Pt endorsed he sometimes experiences anomia.   TODAY'S TREATMENT:                                                                                                                                          08/01/22: Pt states that  long term memory is a struggle, but that short term memory is okay. He is able to help around the house, manage his appointments with the help of a calendar on his phone.Pt wants to have more responsibility at home such as paying the bills, SLP states that demonstrating mastery of a task will allow for easy transfer of responsibilities within the home. SLP led pt through making a calendar for bills. Pt states that he would like help with organization/planning, and word finding skills. SLP recommended stopping to think about what to say before speaking, and increasing awareness of speech. Led through an exercise of looking at a picture for a minute before being asked "what is going on in this picture?" "What all do you see?" And "when do you think this happened." Pt was able to answer questions and stated that he thinks his words came out well. SLP notes that word finding decreases during moments of stress.   07/26/22: Reviewed strategies and compensations for sorting medications into pill box, reminded pt of recommendation to use pill box to increase accuracy of medication management as he returns to independent administration. Pt able to complete task with ~50% accuracy, demonstrating challenges understanding temporal instructions (e.g. take 3x a day needing to go into AM, noon, PM boxes across the week). Mod-I in implementing double check following sorting with x2 instances of error identification. Able to correct missed opportunities with mod-A from SLP. Requires A to implement strategies aimed at supporting organization during task to aid in accurate sorting.   06/12/22: SLP A pt in medication chart to aid in understanding of medications and enable successful medication management. Pt recalls 85% of medications, 90% accuracy in recalling administration times and purpose. Pt able to generate steps for sorting medications with occasional mod-A for addition of strategies to enhance likelihood of accuracy during  completion. Updated HEP to check chart, med admin worksheets.   06/11/22: Pt reported that, including managing finances and medications, he would like to return to cooking, as there are safety concerns d/t to deficits in attention and recall (example: forgetting to turn off stove). He has been teaching his grandson to cook and would like to be able to participate more. His wife has been managing all his medications for him (she is a retired Programmer, systems). During structured medication management task, pt demonstrated slow processing and required extra time to complete task. Pt independently identified 4/8 mistakes in pill sorting, but achieved 7/8 when given min to  mod verbal cues when task became more complex. Pt benefited from occasional reminders to focus on one pill at a time and talk himself through the task aloud. SLP educated pt on importance of starting to organize his pills at home in order to improve in iADLs and regain his independence. Plan to complete medication sorting task during following session.   06/04/22: Assessment completed this session. Pt has 25 year-old grandson.    PATIENT EDUCATION: Education details: see above Person educated: Patient Education method: Explanation Education comprehension: verbalized understanding and needs further education   GOALS: Goals reviewed with patient? Yes  SHORT TERM GOALS: Target date:  08/23/2022 (extended at recert)  Pt will complete PROM during upcoming session.  Baseline: 77 (Cognitive Function questionnaire) Goal status: MET  2.  Pt will demonstrate increased attention to detail in structured iADL tasks with 3 or less errors exhibited given occasional min A.  Baseline:  Goal status: IN PROGRESS  3.  Pt will teach back memory strategies with min-A. Baseline:  Goal status: IN PROGRESS  4.  Pt will carryover cognitive compensations to increase independent completion of iADLs given occasional min A in 2/2 opportunities.   Baseline:  Goal status: IN PROGRESS  5.  Pt will ID and correct any communication breakdowns that occur during ST session with occasional min-A.  Baseline:  Goal status: IN PROGRESS  LONG TERM GOALS: Target date: 09/06/2022 (new date at recert)    Pt will report subjective perception of improved cognitive function by d/c via PROM.  Baseline:  Goal status: IN PROGRESS  2.  Pt will report successful implementation of cognitive compensations in daily life to participate in iADLs. Baseline:  Goal status: IN PROGRESS  3.  Pt will demonstrate increased independence in targeted iADL tasks given rare min-A from wife.  Baseline:  Goal status: IN PROGRESS  4.  Pt will ID and correct communication breakdowns PRN with mod-I.  Baseline:  Goal status: IN PROGRESS  ASSESSMENT:  CLINICAL IMPRESSION: Skilled interventions focused on medication management this date. Recert completed, as pt unable to come to therapy for a few weeks as he transitioned to Medicare. Skilled ST is still indicated based on pt report of challenges at home. Reviewed responses on initial PROM to update current status. Extended dates on goals, as pt was unable to attend therapy, though goals still appear appropriate to A pt in improved participation and confidence in home based activities and iADLs. Continued skilled ST services are recommended to assist pt in accomplishing iADLs and to support cognitive-communication challenges.   OBJECTIVE IMPAIRMENTS: include attention, memory, and executive functioning. These impairments are limiting patient from managing medications, managing finances, household responsibilities, and ADLs/IADLs. Factors affecting potential to achieve goals and functional outcome are co-morbidities. Patient will benefit from skilled SLP services to address above impairments and improve overall function.  REHAB POTENTIAL: Good  PLAN:  SLP FREQUENCY: 2x/week  SLP DURATION: 8 weeks (+6 weeks at recert)    PLANNED INTERVENTIONS: Language facilitation, Cognitive reorganization, Internal/external aids, Functional tasks, SLP instruction and feedback, Compensatory strategies, and Patient/family education   [place], Student-SLP 08/01/2022, 1:44 PM

## 2022-08-01 NOTE — Therapy (Signed)
OUTPATIENT PHYSICAL THERAPY NEURO TREATMENT   Patient Name: Joseph Ortega MRN: [id number] DOB:[birth year], 67 y.o., male Today's Date: 08/01/2022   PCP: [facility] REFERRING PROVIDER: Ranelle Oyster, MD   END OF SESSION:  PT End of Session - 08/01/22 1320     Visit Number 2    Number of Visits 13    Date for PT Re-Evaluation 10/18/22    Authorization Type [employer]    Progress Note Due on Visit 10    PT Start Time 1315    PT Stop Time 1358    PT Time Calculation (min) 43 min    Activity Tolerance Patient tolerated treatment well    Behavior During Therapy WFL for tasks assessed/performed             Past Medical History:  Diagnosis Date   Diabetes mellitus without complication (HCC)    Hypertension    Thyroid disease    Past Surgical History:  Procedure Laterality Date   HERNIA REPAIR     IR ANGIO INTRA EXTRACRAN SEL COM CAROTID INNOMINATE BILAT MOD SED  03/09/2021   IR ANGIO VERTEBRAL SEL VERTEBRAL BILAT MOD SED  03/09/2021   IR US GUIDE VASC ACCESS RIGHT  03/09/2021   LOOP RECORDER INSERTION N/A 05/07/2022   Procedure: LOOP RECORDER INSERTION;  Surgeon: Lanier Prude, MD;  Location: [facility];  Service: Cardiovascular;  Laterality: N/A;   Patient Active Problem List   Diagnosis Date Noted   Hyperlipidemia 05/07/2022   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   Stroke (cerebrum) (HCC) 05/02/2022   ICH (intracerebral hemorrhage) (HCC) 03/17/2021   Hypothyroidism 03/12/2021   OSA (obstructive sleep apnea) 03/12/2021   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   Uncontrolled type 2 diabetes mellitus with hyperglycemia (HCC) 03/12/2021   Redness of both eyes 03/12/2021   Morbid obesity (HCC) 03/11/2021   Essential hypertension 03/11/2021   Tobacco abuse 03/11/2021   Herpes zoster with complication    SAH (subarachnoid hemorrhage) (HCC) 03/08/2021    ONSET DATE:   07/20/2022  referral  REFERRING DIAG: W09.811  (ICD-10-CM) - Cerebral infarction due to occlusion of right middle cerebral artery (HCC)   THERAPY DIAG:  Muscle weakness (generalized)  Other lack of coordination  Unsteadiness on feet  Other abnormalities of gait and mobility  Rationale for Evaluation and Treatment: Rehabilitation  SUBJECTIVE:                                                                                                                                                                                             SUBJECTIVE STATEMENT: Patient reports doing well. Denies  falls/near falls. Using rollator. Still doing HEP previously provided HEP.   Pt accompanied by: self  PERTINENT HISTORY: DM, HTN, Thyroid disease and prior CVA 12/22 with residual L side weakness. Admitted on 05/02/22 due to HA, CP, and found to have hemorrhage in the right temporoparietal region with 2mm L to R midline shift.   PAIN:  Are you having pain? No  VITALS Vitals:   08/01/22 1320  BP: (!) 152/62    PRECAUTIONS: Fall  PATIENT GOALS: "get to walking on a cane and get this arm working as much as I can"  OBJECTIVE:   DIAGNOSTIC FINDINGS: MRI of brain on 05/02/22   IMPRESSION: 1. Evaluation is limited by motion artifact. In addition, the MRI head could not be completed in its entirety. 2. Redemonstrated area of infarction with hemorrhage in the right temporoparietal region, which appears overall similar to the same day CT, when allowing for differences in technique. Edema causes mass effect on the right lateral ventricle and approximately 2 mm right-to-left midline shift. 3. No intracranial large vessel occlusion. Suspect severe stenosis in the left P2, although evaluation is limited by motion. 4. No hemodynamically significant stenosis in the neck.   TODAY'S TREATMENT:                 NMR  -hooklying bridges x 10-> B LE on physioball 2x10  -hooklying bridges with B LE on physioball + HSC x10  -seated ankle AROM coordination  on wobble board          -seated ankle alphabet (added to HEP)  -scifit hills level 1 x5 mins                                                                                      PATIENT EDUCATION: Education details: continue HEP Person educated: Patient Education method: Explanation Education comprehension: verbalized understanding and needs further education  HOME EXERCISE PROGRAM: To be reviewed from previous bout of therapy  Access Code: [email or web address] URL: [email or web address] Date: 04/03/2022 Prepared by: Peter Congo   Exercises - Sit to Stand Without Arm Support  - 1 x daily - 7 x weekly - 3 sets - 10 reps - Mini Squat with Counter Support  - 1 x daily - 7 x weekly - 3 sets - 10 reps - Standing Hip Extension with Unilateral Counter Support  - 1 x daily - 7 x weekly - 3 sets - 10 reps - Standing Hip Abduction with Unilateral Counter Support  - 1 x daily - 7 x weekly - 3 sets - 10 reps - Seated Ankle Alphabet  - 1 x daily - 7 x weekly - 1 sets - 1 reps  GOALS:  Goals reviewed with patient? Yes   SHORT TERM GOALS: Target date: 08/24/22    1. Pt will be independent with independent HEP for improved strength, balance, transfers and gait.  Baseline: to be reviewed from prior bout of therapy  Goal status: INITIAL   2.  Pt will improve gait velocity to at least .50m/s with LRAD for improved gait efficiency   Baseline: .33m/s  Goal status: INITIAL   3.  Pt will improve 5 x  STS to less than or equal to 15 seconds w/RUE support and good form to demonstrate improved functional strength and transfer efficiency.   Baseline:  20.97s with R UE  Goal status: INITIAL   4.  Pt will improve normal TUG to less than or equal to 27 seconds w/LRAD for improved functional mobility and decreased fall risk.   Baseline: 33.18s with rollator  Goal status: INITIAL   5.  Patient will improve BBS score to >/= 27/56 to demonstrate improved balance  Baseline: 20/56  Goal status:  INITIAL    LONG TERM GOALS: Target date: 09/21/22    1. Pt will be independent with final HEP for improved strength, balance, transfers and gait.   Baseline:  Goal status: INITIAL   2.  Pt will improve gait velocity to at least .63m/s with LRAD for improved gait efficiency   Baseline: .36m/s  Goal status: INITIAL   3.  Pt will improve 5 x STS to less than or equal to 12 seconds w/RUE support and proper form to demonstrate improved functional strength and transfer efficiency.   Baseline: 20.97s with R UE  Goal status: INITIAL   4.  Patient will improve BBS score to >/=36/56 to demonstrate improved balance  Baseline: 20/56  Goal status: INITIAL   5.  Pt will improve normal TUG to less than or equal to 23 seconds w/LRAD for improved functional mobility and decreased fall risk.   Baseline: 33.18s with rollator  Goal status: INITIAL    ASSESSMENT:   CLINICAL IMPRESSION:  Patient seen for skilled PT session with emphasis on L hemi NMR. Able to progress bridges well moving outside of tone pattern with HSC in relative hip extension. Improving L ankle AROM/coordination noted- will need to progress especially since patient is not wearing his AFO. Continue POC.    OBJECTIVE IMPAIRMENTS: Abnormal gait, decreased activity tolerance, decreased balance, decreased coordination, decreased endurance, decreased knowledge of use of DME, difficulty walking, decreased strength, decreased safety awareness, and impaired UE functional use   ACTIVITY LIMITATIONS: carrying, lifting, bending, standing, squatting, stairs, bed mobility, reach over head, locomotion level, and caring for others   PARTICIPATION LIMITATIONS: meal prep, cleaning, laundry, medication management, interpersonal relationship, driving, shopping, community activity, occupation, and yard work   PERSONAL FACTORS: Fitness, Past/current experiences, Time since onset of injury/illness/exacerbation, Transportation, and 1-2 comorbidities:  HTN and multiple CVA  are also affecting patient's functional outcome.   REHAB POTENTIAL: Fair due to multiple CVAs and lack of support at home   CLINICAL DECISION MAKING: Evolving/moderate complexity   EVALUATION COMPLEXITY: Moderate   PLAN:   PT FREQUENCY: 1x/week per patient request   PT DURATION: 12 weeks   PLANNED INTERVENTIONS: Therapeutic exercises, Therapeutic activity, Neuromuscular re-education, Balance training, Gait training, Patient/Family education, Self Care, Joint mobilization, Stair training, Orthotic/Fit training, DME instructions, Aquatic Therapy, Dry Needling, Manual therapy, and Re-evaluation   PLAN FOR NEXT SESSION: Review previous HEP and update to ensure it is safe. L NMR and global strength, floor transfers  Westley Foots, PT, DPT, CBIS  08/01/2022, 3:07 PM

## 2022-08-01 NOTE — Therapy (Signed)
OUTPATIENT OCCUPATIONAL THERAPY NEURO TREATMENT  Patient Name: Joseph Ortega MRN: [id number] DOB:November 25, 1955, 67 y.o., male Today's Date: 08/01/2022  PCP: Dr. Ross Ludwig REFERRING PROVIDER: Charlton Amor, PA-C  END OF SESSION:  OT End of Session - 08/01/22 1239     Visit Number 4    Number of Visits 17    Date for OT Re-Evaluation 08/17/22    Authorization Type BC/BS, VL: 30 Combined, however pt reports getting MCR in April    OT Start Time 1240    OT Stop Time 1310    OT Time Calculation (min) 30 min    Activity Tolerance Patient tolerated treatment well    Behavior During Therapy WFL for tasks assessed/performed             Past Medical History:  Diagnosis Date   Diabetes mellitus without complication (HCC)    Hypertension    Thyroid disease    Past Surgical History:  Procedure Laterality Date   HERNIA REPAIR     IR ANGIO INTRA EXTRACRAN SEL COM CAROTID INNOMINATE BILAT MOD SED  03/09/2021   IR ANGIO VERTEBRAL SEL VERTEBRAL BILAT MOD SED  03/09/2021   IR US GUIDE VASC ACCESS RIGHT  03/09/2021   LOOP RECORDER INSERTION N/A 05/07/2022   Procedure: LOOP RECORDER INSERTION;  Surgeon: Lanier Prude, MD;  Location: [facility];  Service: Cardiovascular;  Laterality: N/A;   Patient Active Problem List   Diagnosis Date Noted   Hyperlipidemia 05/07/2022   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   Stroke (cerebrum) (HCC) 05/02/2022   ICH (intracerebral hemorrhage) (HCC) 03/17/2021   Hypothyroidism 03/12/2021   OSA (obstructive sleep apnea) 03/12/2021   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   Uncontrolled type 2 diabetes mellitus with hyperglycemia (HCC) 03/12/2021   Redness of both eyes 03/12/2021   Morbid obesity (HCC) 03/11/2021   Essential hypertension 03/11/2021   Tobacco abuse 03/11/2021   Herpes zoster with complication    SAH (subarachnoid hemorrhage) (HCC) 03/08/2021    ONSET DATE: 05/23/2022 (referral date)   REFERRING DIAG:   I63.511 (ICD-10-CM) - Cerebral infarction due to unspecified occlusion or stenosis of right middle cerebral artery  *New stroke 05/02/22 w/ more weakness LUE  THERAPY DIAG:  Muscle weakness (generalized)  Other lack of coordination  Hemiplegia and hemiparesis following cerebral infarction affecting left non-dominant side (HCC)  Chronic left shoulder pain  Right middle cerebral artery stroke (HCC)  Localized edema  Rationale for Evaluation and Treatment: Rehabilitation  SUBJECTIVE:   SUBJECTIVE STATEMENT: Denies recent falls. He has tan putty at home. Pt accompanied by: self  PERTINENT HISTORY:  67 y.o. right-handed male with history of hypertension as well as diabetes mellitus, CVA Dec 2022 with left-sided residual weakness maintained on low-dose aspirin.  Per chart review lives with wife.  1 level home with a ramped entrance.  Wife is reportedly limited mobility.  Presented 05/02/2022 with headache and nonspecific chest pain.  Cranial CT scan showed acute hemorrhagic infarction affecting the right posterior parietal lobe.   PRECAUTIONS: Fall and Other: loop recorder, no driving  WEIGHT BEARING RESTRICTIONS: No  PAIN:  Are you having pain? No  FALLS: Has patient fallen in last 6 months? No  LIVING ENVIRONMENT: Lives with: lives with their spouse Pt lives in 1 story home with ramped entrance Has following equipment at home: Quad cane large base, Environmental consultant - 2 wheeled, Environmental consultant - 4 wheeled, Wheelchair (manual), bed side commode, Grab bars, Ramped entry, and rollator, built in shower seat  PLOF: Independent and prior to Dec 2022, working on getting long term disability, likes to cook  PATIENT GOALS: get my Lt arm better, use a cane instead of rollator  OBJECTIVE:   HAND DOMINANCE: Right  ADLs: Eating: mod I but needs assist to cut food Grooming: mod I UB Dressing: mod I  LB Dressing: mod I  Toileting: mod I  Bathing: min assist for washing back and rinsing (seated)  Tub  Shower transfers: mod I w/ walk in shower, 2 grab bars Equipment:  see above  IADLs: Shopping: dependent (wife or grandson doing)  Light housekeeping: pt does laundry but needs assist to fold Meal Prep: dependent (was max assist prior to new stroke)  Community mobility: relies on family for transportation Medication management: wife assist (pt was independent prior to new stroke)  Financial management: same as meds management Handwriting:  denies change  MOBILITY STATUS:  rollator in community, RW in house   UPPER EXTREMITY ROM:  RUE AROM WNL's.  LUE: Dominated by synergy pattern w/ min shoulder movement going into abduction w/ no distal control. However distally (isolated), pt has supination to neutral, 75% wrist ext, 75% full composite flex, and 90% full composite ext.    HAND FUNCTION: Grip strength: Right: 88.6 lbs; Left: 23.5 lbs 08/01/2022: no change  COORDINATION: Pt could pick up block from lower surface w/ gross grasp, but unable to do Box & Blocks d/t limited shoulder movement and distal control  SENSATION: WFL per pt report  EDEMA: none during eval, minimal at times per pt report  MUSCLE TONE: LUE: Hypertonic  COGNITION: Overall cognitive status:  memory changes since first stroke in Dec 2022, worse since new stroke  VISION: Subjective report: I lost my peripheral vision on my Lt side with this new stroke (pt had Lt lower quadrant loss from first stroke)  Baseline vision: Wears glasses for reading only Visual history: retinopathy and corrective eye surgery  VISION ASSESSMENT: Ocular ROM: WFL Tracking/Visual pursuits: Able to track stimulus in all quads without difficulty Convergence: WFL Visual Fields: Left visual field deficits   PERCEPTION: Not tested  PRAXIS: Not tested  OBSERVATIONS: new Lt visual field loss, slightly weaker LUE however presents the same as eval prior to new stroke   TODAY'S TREATMENT:                                                                                                                               OT initiated red theraputty exercises (search, grip, pinch, key grip, spread, and rope squeeze) as noted in patient instructions for coordination and strength Pt completed simulated cutting of food with use of red putty, fork with foam tubing, and butter knife. Pt demonstrating with use of rocker knife  PATIENT EDUCATION: Education details: LUE HEP, adaptive feeding strategies Person educated: Patient Education method: Explanation, Demonstration, and Handouts Education comprehension: verbalized understanding, returned demonstration, and needs further education  HOME EXERCISE PROGRAM: 04/17/22: Review of previously  issued HEP (prior to new stroke - however still appropriate)  07/24/22: LUE HEP  08/01/2022: L red putty HEP  GOALS: Goals reviewed with patient? Yes  SHORT TERM GOALS: Target date: 07/05/22  Independent with initial HEP and visual scanning strategies Baseline: Goal status: MET  2.  Pt will verbalize understanding of A/E needs to increase safety and independence with cutting food, cooking, etc Baseline:  Goal status: MET  3.  Pt will verbalize understanding with memory compensatory strategies  Baseline:  Goal status:IN PROGRESS  4.  Pt to use Lt hand as stabilizer for bilateral tasks Baseline:  Goal status: INITIAL   LONG TERM GOALS: Target date: 08/17/22  Pt will be independent with updated HEP for LUE Baseline:  Goal status: INITIAL  2.  Pt will improve LUE function as being able to perform 5 blocks on Box & Blocks test Baseline: unable Goal status: INITIAL  3.  Pt will improve UEFS to 46% or higher Baseline: 43% on previous eval in Jan 2024 Goal status: INITIAL  4.  Pt will be able to prepare simple meal with direct supervision Baseline:  Goal status: INITIAL  5.  Pt will demo 30* sh flexion LUE for low level reaching Baseline: no true sh flex, approx 60* abduction w/ no distal  control Goal status: INITIAL  6.  Pt will perform tabletop and environmental scanning safely with at least 75% accuracy Baseline:  Goal status: INITIAL  ASSESSMENT:  CLINICAL IMPRESSION: Pt will require additional reinforcement of education due to cognition but demonstrates good return of information and adaptations as need to progress towards goals.   PERFORMANCE DEFICITS: in functional skills including ADLs, IADLs, coordination, dexterity, tone, ROM, pain, Fine motor control, Gross motor control, mobility, balance, body mechanics, decreased knowledge of use of DME, and UE functional use, cognitive skills including memory, and psychosocial skills including coping strategies.   IMPAIRMENTS: are limiting patient from ADLs, IADLs, leisure, and social participation.   CO-MORBIDITIES: may have co-morbidities  that affects occupational performance. Patient will benefit from skilled OT to address above impairments and improve overall function.  REHAB POTENTIAL: Good  PLAN:  OT FREQUENCY: 2x/week  OT DURATION: 8 weeks (plus eval) over 10 week duration d/t scheduling conflicts  PLANNED INTERVENTIONS: self care/ADL training, therapeutic exercise, therapeutic activity, neuromuscular re-education, manual therapy, passive range of motion, functional mobility training, electrical stimulation, moist heat, patient/family education, cognitive remediation/compensation, visual/perceptual remediation/compensation, energy conservation, coping strategies training, and DME and/or AE instructions  RECOMMENDED OTHER SERVICES: none at this time  CONSULTED AND AGREED WITH PLAN OF CARE: Patient  PLAN FOR NEXT SESSION: review putty HEP; progress towards remaining STGs   Delana Meyer, OT 08/01/2022, 1:47 PM

## 2022-08-07 NOTE — Progress Notes (Signed)
[facility] Summary Report / Loop Recorder 

## 2022-08-08 ENCOUNTER — Ambulatory Visit: Payer: Medicare HMO | Admitting: Occupational Therapy

## 2022-08-08 ENCOUNTER — Ambulatory Visit: Payer: Medicare HMO | Admitting: Speech Pathology

## 2022-08-08 ENCOUNTER — Ambulatory Visit: Payer: Medicare HMO

## 2022-08-14 ENCOUNTER — Ambulatory Visit (INDEPENDENT_AMBULATORY_CARE_PROVIDER_SITE_OTHER): Payer: Medicare HMO

## 2022-08-14 DIAGNOSIS — I639 Cerebral infarction, unspecified: Secondary | ICD-10-CM | POA: Diagnosis not present

## 2022-08-15 ENCOUNTER — Encounter: Payer: Medicare HMO | Attending: Physical Medicine & Rehabilitation | Admitting: Physical Medicine & Rehabilitation

## 2022-08-15 ENCOUNTER — Telehealth: Payer: Self-pay | Admitting: Cardiology

## 2022-08-15 ENCOUNTER — Encounter: Payer: Self-pay | Admitting: Physical Medicine & Rehabilitation

## 2022-08-15 VITALS — BP 136/70 | HR 52 | Ht 73.0 in | Wt 229.2 lb

## 2022-08-15 DIAGNOSIS — M542 Cervicalgia: Secondary | ICD-10-CM | POA: Insufficient documentation

## 2022-08-15 DIAGNOSIS — M4802 Spinal stenosis, cervical region: Secondary | ICD-10-CM | POA: Diagnosis present

## 2022-08-15 DIAGNOSIS — I63511 Cerebral infarction due to unspecified occlusion or stenosis of right middle cerebral artery: Secondary | ICD-10-CM | POA: Insufficient documentation

## 2022-08-15 LAB — CUP PACEART REMOTE DEVICE CHECK
Date Time Interrogation Session: 20240528020252
Implantable Pulse Generator Implant Date: 20240219
Pulse Gen Serial Number: 511022381

## 2022-08-15 MED ORDER — TIZANIDINE HCL 2 MG PO TABS
2.0000 mg | ORAL_TABLET | Freq: Three times a day (TID) | ORAL | 2 refills | Status: DC
Start: 2022-08-15 — End: 2022-10-18

## 2022-08-15 NOTE — Telephone Encounter (Signed)
STAT if HR is under 50 or over 120 (normal HR is 60-100 beats per minute)  What is your heart rate? 52  Do you have a log of your heart rate readings (document readings)? N/A  Do you have any other symptoms? Patient's wife says that patient fell recently and she would like a call back to discuss his hr and bp and to see when the patient should be seen. BP 136/70

## 2022-08-15 NOTE — Progress Notes (Signed)
Subjective:    Patient ID: Joseph Ortega, male    DOB: [birth year], 67 y.o.   MRN: [id number]  HPI  Joseph Ortega is here in follow up of his right MCA/PCA infarct. He's on weekly therapy currently. He last had it a couple weeks ago. He is reporting a lot of lower-mid neck pain with radiation into his traps bilaterally. It is tender to touch and with flexion and rotation in particular. He had seen Dr. Jordan Ortega, pain mgt  for a period of time for the neck previously. He had last seen him 2-3 years ago. He is using robaxin once per day with little relief for his neck symptoms. He typically will just lay down or try to stretch it.   Headaches are intermittent and typically are self-limited. They are usually around temple areas. He is on topamax. He has limited peripheral field vision since the intial stroke which had improved until last stroke.   Bowel or bladder function has been regular. Appetite has been good. Breathing has been normal without any coughing. Sleep has been regular, and the patient feels rested upon waking.  Mood has been fair. He has been stressed by his own issues and family affairs as well. No falls or mishaps have occurred at home. Pt hasn't had any skin breakdown or new wounds occur.  No recent illness or medical changes are reported either.     Pain Inventory Average Pain 3 Pain Right Now 3 My pain is constant, dull, and aching  LOCATION OF PAIN  right shoulder neck head  BOWEL Number of stools per week: 7 Oral laxative use Yes  Type of laxative stool softner  BLADDER Normal  Mobility use a walker ability to climb steps?  yes do you drive?  no  Function disabled: date disabled in process I need assistance with the following:  bathing, meal prep, household duties, and shopping  Neuro/Psych trouble walking confusion depression anxiety  Prior Studies Any changes since last visit?  no  Physicians involved in your care Any changes since last visit?  no   Family  History  Problem Relation Age of Onset   Diabetes Father    Social History   Socioeconomic History   Marital status: Married    Spouse name: Not on file   Number of children: Not on file   Years of education: Not on file   Highest education level: Not on file  Occupational History   Not on file  Tobacco Use   Smoking status: Never   Smokeless tobacco: Never  Vaping Use   Vaping Use: Never used  Substance and Sexual Activity   Alcohol use: No   Drug use: No   Sexual activity: Not on file  Other Topics Concern   Not on file  Social History Narrative   Not on file   Social Determinants of Health   Financial Resource Strain: Not on file  Food Insecurity: No Food Insecurity (05/04/2022)   Hunger Vital Sign    Worried About Running Out of Food in the Last Year: Never true    Ran Out of Food in the Last Year: Never true  Transportation Needs: No Transportation Needs (05/04/2022)   PRAPARE - Administrator, Civil Service (Medical): No    Lack of Transportation (Non-Medical): No  Physical Activity: Not on file  Stress: Not on file  Social Connections: Not on file   Past Surgical History:  Procedure Laterality Date   HERNIA REPAIR  IR ANGIO INTRA EXTRACRAN SEL COM CAROTID INNOMINATE BILAT MOD SED  03/09/2021   IR ANGIO VERTEBRAL SEL VERTEBRAL BILAT MOD SED  03/09/2021   IR US GUIDE VASC ACCESS RIGHT  03/09/2021   LOOP RECORDER INSERTION N/A 05/07/2022   Procedure: LOOP RECORDER INSERTION;  Surgeon: Lanier Prude, MD;  Location: [facility];  Service: Cardiovascular;  Laterality: N/A;   Past Medical History:  Diagnosis Date   Diabetes mellitus without complication (HCC)    Hypertension    Thyroid disease    BP (!) 160/80   Pulse (!) 52   Ht 6\' 1"  (1.854 m)   Wt 229 lb 3.2 oz (104 kg)   SpO2 96%   BMI 30.24 kg/m   Opioid Risk Score:   Fall Risk Score:  `1  Depression screen [facility] 2/9     08/15/2022   10:09 AM 02/28/2022    9:21 AM  12/06/2021    1:14 PM 08/23/2021    1:27 PM 06/15/2021    4:06 PM 05/09/2021   12:44 PM  Depression screen PHQ 2/9  Decreased Interest 0 0 3 1 0 1  Down, Depressed, Hopeless 0 0 3 1 0 1  PHQ - 2 Score 0 0 6 2 0 2  Altered sleeping      1  Tired, decreased energy      1  Change in appetite      1  Feeling bad or failure about yourself       1  Trouble concentrating      0  Moving slowly or fidgety/restless      0  Suicidal thoughts      0  PHQ-9 Score      6      Review of Systems  Constitutional: Negative.   HENT: Negative.    Eyes: Negative.   Respiratory: Negative.    Cardiovascular: Negative.   Gastrointestinal: Negative.   Endocrine: Negative.   Genitourinary: Negative.   Musculoskeletal:  Positive for gait problem.       Right shoulder neck and head  Skin: Negative.   Allergic/Immunologic: Negative.   Hematological: Negative.   Psychiatric/Behavioral:  Positive for confusion and dysphoric mood. The patient is nervous/anxious.   All other systems reviewed and are negative.      Objective:   Physical Exam  General: Alert and oriented x 3, No apparent distress HEENT: Head is normocephalic, atraumatic, PERRLA, EOMI, sclera anicteric, oral mucosa pink and moist, dentition intact, ext ear canals clear,  Neck: Supple without JVD or lymphadenopathy Heart: Reg rate and rhythm. No murmurs rubs or gallops Chest: CTA bilaterally without wheezes, rales, or rhonchi; no distress Abdomen: Soft, non-tender, non-distended, bowel sounds positive. Extremities: No clubbing, cyanosis, or edema. Pulses are 2+ Psych: Pt's affect is appropriate. Pt is cooperative Skin: Clean and intact without signs of breakdown Neuro:  Alert and oriented x 3. Normal insight and awareness. Intact Memory. Normal language and speech. Cranial nerve exam unremarkable although he may have some left field loss vs left inattention. MMT: 5/5 RUE and RLE. LUE 4/5. LLE 4/5. Seems to lack fine motor movement and  proprioception to a degree on left. . Delayed movement with left leg and arm.    Musculoskeletal: Neck with tenderness on palpation. Tighter with right rotation and pain in the left trap and lower neck with this movement. Left trap is taut. No limits in flexion or left rotation/extension. Head forward posture.  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 Myofascial pain component    Plan; Xrays of neck to assess alignment and disc spaces Trial of tizanidine 2mg , titrating up to 2mg  TID Stop robaxin Cervical ROM exercises. Discussed posture as well.  Continue topamax-generally controlled 6.  Maintain cymbalta for mood. 7. Needs follow up visual exam to return driving. Ultimately this may be an attention issue. He is still missing things on left.    Twenty minutes of face to face patient care time were spent during this visit. All questions were encouraged and answered.  Follow up with me in 2 mos .

## 2022-08-15 NOTE — Patient Instructions (Addendum)
ALWAYS FEEL FREE TO CALL OUR OFFICE WITH ANY PROBLEMS OR QUESTIONS [phone])  **PLEASE NOTE** ALL MEDICATION REFILL REQUESTS (INCLUDING CONTROLLED SUBSTANCES) NEED TO BE MADE AT LEAST 7 DAYS PRIOR TO REFILL BEING DUE. ANY REFILL REQUESTS INSIDE THAT TIME FRAME MAY RESULT IN DELAYS IN RECEIVING YOUR PRESCRIPTION.    TIZANIDINE 2MG   TAKE ONE AT NIGHT FOR 3 DAYS, THEN ONE TWICE DAILY FOR 3 DAYS, THEN ONE THREE X DAILY THEREAFTER.  STOP ROBAXIN/METHOCARBAMOL

## 2022-08-15 NOTE — Telephone Encounter (Signed)
Remote transmission received 08/14/22 for monthly report.   Shows normal device function with no episodes.   Will need to call 08/16/22 to update patient/family.

## 2022-08-15 NOTE — Telephone Encounter (Signed)
Patient wife Joseph Ortega, [place], stated last week while patient was sitting on the shower chair fell out of the shower , wife pt is not sure if hit his head and   EMT was called pt refused medical treatment. Pt fell yesterday in the driveway, he did not hit his head, EMS was called pt refused medical treatment. Pt had OV with Dr. Riley Kill PM&R during OV his bp 130/70 HR 52, pt was advised:   Plan; Xrays of neck to assess alignment and disc spaces Trial of tizanidine 2mg , titrating up to 2mg  TID Stop robaxin Cervical ROM exercises. Discussed posture as well.  Continue topamax-generally controlled 6.  Maintain cymbalta for mood. 7. Needs follow up visual exam to return driving. Ultimately this may be an attention issue. He is still missing things on left.     Ms. Joseph Ortega stated pt supposed to have f/u with Dr. Elberta Fortis. Will forward to MD and nurse for advise.

## 2022-08-15 NOTE — Telephone Encounter (Signed)
Followed up w/ wife, informed that we typically do not see ILR pt's that had them implanted for stroke, unless we find something on the monitor.  Aware I will forward to device clinic to see if low HRs were picked up in the last couple weeks (episode occurred last week).

## 2022-08-16 ENCOUNTER — Ambulatory Visit
Admission: RE | Admit: 2022-08-16 | Discharge: 2022-08-16 | Disposition: A | Discharge status: Home / Self Care | Payer: Medicare HMO | Source: Ambulatory Visit | Attending: Physical Medicine & Rehabilitation | Admitting: Physical Medicine & Rehabilitation

## 2022-08-16 DIAGNOSIS — M542 Cervicalgia: Secondary | ICD-10-CM

## 2022-08-17 ENCOUNTER — Ambulatory Visit: Payer: Medicare HMO | Admitting: Physical Therapy

## 2022-08-17 ENCOUNTER — Ambulatory Visit: Payer: Medicare HMO | Admitting: Occupational Therapy

## 2022-08-17 ENCOUNTER — Ambulatory Visit: Payer: Medicare HMO | Admitting: Speech Pathology

## 2022-08-17 VITALS — BP 148/63 | HR 55

## 2022-08-17 DIAGNOSIS — I63511 Cerebral infarction due to unspecified occlusion or stenosis of right middle cerebral artery: Secondary | ICD-10-CM

## 2022-08-17 DIAGNOSIS — R278 Other lack of coordination: Secondary | ICD-10-CM

## 2022-08-17 DIAGNOSIS — I69354 Hemiplegia and hemiparesis following cerebral infarction affecting left non-dominant side: Secondary | ICD-10-CM

## 2022-08-17 DIAGNOSIS — M6281 Muscle weakness (generalized): Secondary | ICD-10-CM

## 2022-08-17 DIAGNOSIS — R41841 Cognitive communication deficit: Secondary | ICD-10-CM

## 2022-08-17 DIAGNOSIS — G8929 Other chronic pain: Secondary | ICD-10-CM
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
## 2022-08-17 NOTE — Therapy (Signed)
OUTPATIENT OCCUPATIONAL THERAPY NEURO TREATMENT and PROGRESS NOTE  Patient Name: Joseph Ortega MRN: [id number] DOB:[birth year], 67 y.o., male Today's Date: 08/17/2022  PCP: Dr. Ross Ludwig REFERRING PROVIDER: Charlton Amor, PA-C  END OF SESSION:  OT End of Session - 08/17/22 1252     Visit Number 5    Number of Visits 17    Date for OT Re-Evaluation 10/19/22    Authorization Type [employer] - requires auth    Progress Note Due on Visit 15    OT Start Time 1317    OT Stop Time 1400    OT Time Calculation (min) 43 min    Activity Tolerance Patient tolerated treatment well    Behavior During Therapy WFL for tasks assessed/performed              Past Medical History:  Diagnosis Date   Diabetes mellitus without complication (HCC)    Hypertension    Thyroid disease    Past Surgical History:  Procedure Laterality Date   HERNIA REPAIR     IR ANGIO INTRA EXTRACRAN SEL COM CAROTID INNOMINATE BILAT MOD SED  03/09/2021   IR ANGIO VERTEBRAL SEL VERTEBRAL BILAT MOD SED  03/09/2021   IR US GUIDE VASC ACCESS RIGHT  03/09/2021   LOOP RECORDER INSERTION N/A 05/07/2022   Procedure: LOOP RECORDER INSERTION;  Surgeon: Lanier Prude, MD;  Location: [facility];  Service: Cardiovascular;  Laterality: N/A;   Patient Active Problem List   Diagnosis Date Noted   Cervicalgia 08/15/2022   Hyperlipidemia 05/07/2022   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   Stroke (cerebrum) (HCC) 05/02/2022   ICH (intracerebral hemorrhage) (HCC) 03/17/2021   Hypothyroidism 03/12/2021   OSA (obstructive sleep apnea) 03/12/2021   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   Uncontrolled type 2 diabetes mellitus with hyperglycemia (HCC) 03/12/2021   Redness of both eyes 03/12/2021   Morbid obesity (HCC) 03/11/2021   Essential hypertension 03/11/2021   Tobacco abuse 03/11/2021   Herpes zoster with complication    SAH (subarachnoid hemorrhage) (HCC) 03/08/2021    ONSET DATE:  05/23/2022 (referral date)   REFERRING DIAG:  I63.511 (ICD-10-CM) - Cerebral infarction due to unspecified occlusion or stenosis of right middle cerebral artery  *New stroke 05/02/22 w/ more weakness LUE  THERAPY DIAG:  Muscle weakness (generalized)  Other lack of coordination  Hemiplegia and hemiparesis following cerebral infarction affecting left non-dominant side (HCC)  Cognitive communication deficit  Chronic left shoulder pain  Right middle cerebral artery stroke (HCC)  Rationale for Evaluation and Treatment: Rehabilitation  SUBJECTIVE:   SUBJECTIVE STATEMENT: Reports he had a rough week and did not get his exercises done.   Pt accompanied by: self  PERTINENT HISTORY:  67 y.o. right-handed male with history of hypertension as well as diabetes mellitus, CVA Dec 2022 with left-sided residual weakness maintained on low-dose aspirin.  Per chart review lives with wife.  1 level home with a ramped entrance.  Wife is reportedly limited mobility.  Presented 05/02/2022 with headache and nonspecific chest pain.  Cranial CT scan showed acute hemorrhagic infarction affecting the right posterior parietal lobe.   PRECAUTIONS: Fall and Other: loop recorder, no driving  WEIGHT BEARING RESTRICTIONS: No  PAIN:  Are you having pain? Yes: NPRS scale: 4/10 Pain location: neck Pain description: sore Aggravating factors: movement; touch Relieving factors: rest  FALLS: Has patient fallen in last 6 months? Yes though unable to confirm number of falls  LIVING ENVIRONMENT: Lives with: lives with their spouse Pt  lives in 1 story home with ramped entrance Has following equipment at home: Quad cane large base, Environmental consultant - 2 wheeled, Environmental consultant - 4 wheeled, Wheelchair (manual), bed side commode, Grab bars, Ramped entry, and rollator, built in shower seat  PLOF: Independent and prior to Dec 2022, working on getting long term disability, likes to [employer]  PATIENT GOALS: get my Lt arm better, use a cane  instead of rollator  OBJECTIVE:   HAND DOMINANCE: Right  ADLs: Eating: mod I but needs assist to cut food Grooming: mod I UB Dressing: mod I  LB Dressing: mod I  Toileting: mod I  Bathing: min assist for washing back and rinsing (seated)  Tub Shower transfers: mod I w/ walk in shower, 2 grab bars Equipment:  see above  IADLs: Shopping: dependent (wife or grandson doing)  Light housekeeping: pt does laundry but needs assist to fold Meal Prep: dependent (was max assist prior to new stroke)  Community mobility: relies on family for transportation Medication management: wife assist (pt was independent prior to new stroke)  Financial management: same as meds management Handwriting:  denies change  MOBILITY STATUS:  rollator in community, RW in house   UPPER EXTREMITY ROM:  RUE AROM WNL's.  LUE: Dominated by synergy pattern w/ min shoulder movement going into abduction w/ no distal control. However distally (isolated), pt has supination to neutral, 75% wrist ext, 75% full composite flex, and 90% full composite ext.    HAND FUNCTION: Grip strength: Right: 88.6 lbs; Left: 23.5 lbs 08/01/2022: no change  COORDINATION: Pt could pick up block from lower surface w/ gross grasp, but unable to do Box & Blocks d/t limited shoulder movement and distal control  SENSATION: WFL per pt report  EDEMA: none during eval, minimal at times per pt report  MUSCLE TONE: LUE: Hypertonic  COGNITION: Overall cognitive status:  memory changes since first stroke in Dec 2022, worse since new stroke  VISION: Subjective report: I lost my peripheral vision on my Lt side with this new stroke (pt had Lt lower quadrant loss from first stroke)  Baseline vision: Wears glasses for reading only Visual history: retinopathy and corrective eye surgery  VISION ASSESSMENT: Ocular ROM: WFL Tracking/Visual pursuits: Able to track stimulus in all quads without difficulty Convergence: WFL Visual Fields: Left  visual field deficits   PERCEPTION: Not tested  PRAXIS: Not tested  OBSERVATIONS: new Lt visual field loss, slightly weaker LUE however presents the same as eval prior to new stroke   TODAY'S TREATMENT:                                                                                                                              - Therapeutic activities completed for duration as noted below including: Therapist reviewed goals with patient and updated patient progression.    Large block puzzle at table top for visual scanning and functional use of LUE. Cue to only utilize LUE with completion. Pt  requiring increased time for completion including cues to stay on task.   PATIENT EDUCATION: Education details: LUE use; therapy extension Person educated: Patient Education method: Explanation and Demonstration Education comprehension: verbalized understanding, returned demonstration, and needs further education  HOME EXERCISE PROGRAM: 04/17/22: Review of previously issued HEP (prior to new stroke - however still appropriate)  07/24/22: LUE HEP  08/01/2022: L red putty HEP  GOALS: Goals reviewed with patient? Yes  SHORT TERM GOALS:   Independent with initial HEP and visual scanning strategies Baseline: Goal status: MET  2.  Pt will verbalize understanding of A/E needs to increase safety and independence with cutting food, cooking, etc Baseline:  Goal status: MET  3.  Pt will verbalize understanding with memory compensatory strategies  Baseline:  Goal status: MET  4.  Pt to use Lt hand as stabilizer for bilateral tasks Baseline:  Goal status: INITIAL   LONG TERM GOALS: Target date: 10/19/22  Pt will be independent with updated HEP for LUE Baseline:  Goal status: INITIAL  2.  Pt will improve LUE function as being able to perform 5 blocks on Box & Blocks test Baseline: unable Goal status: IN PROGRESS  3.  Pt will improve UEFS to 46% or higher Baseline: 43% on previous eval in  Jan 2024 Goal status: INITIAL  4.  Pt will be able to prepare simple meal with direct supervision Baseline:  Goal status: MET  5.  Pt will demo 30* sh flexion LUE for low level reaching Baseline: no true sh flex, approx 60* abduction w/ no distal control Goal status: MET  6.  Pt will perform tabletop and environmental scanning safely with at least 75% accuracy Baseline:  Goal status: INITIAL  ASSESSMENT:  CLINICAL IMPRESSION: This 5th progress note is for dates: 06/04/2022 to 08/17/2022. Pt has met 3/4 STGs and 2/6 LTGs. Pt making progress towards goals as expected and continues to benefit from skilled OT services in the outpatient setting to work towards remaining goals or until max rehab potential is met.   PERFORMANCE DEFICITS: in functional skills including ADLs, IADLs, coordination, dexterity, tone, ROM, pain, Fine motor control, Gross motor control, mobility, balance, body mechanics, decreased knowledge of use of DME, and UE functional use, cognitive skills including memory, and psychosocial skills including coping strategies.   IMPAIRMENTS: are limiting patient from ADLs, IADLs, leisure, and social participation.   CO-MORBIDITIES: may have co-morbidities  that affects occupational performance. Patient will benefit from skilled OT to address above impairments and improve overall function.  REHAB POTENTIAL: Good  PLAN:  OT FREQUENCY: 2x/week  OT DURATION: additional 8 weeks   PLANNED INTERVENTIONS: self care/ADL training, therapeutic exercise, therapeutic activity, neuromuscular re-education, manual therapy, passive range of motion, functional mobility training, electrical stimulation, moist heat, patient/family education, cognitive remediation/compensation, visual/perceptual remediation/compensation, energy conservation, coping strategies training, and DME and/or AE instructions  RECOMMENDED OTHER SERVICES: none at this time  CONSULTED AND AGREED WITH PLAN OF CARE:  Patient  PLAN FOR NEXT SESSION: review putty HEP (if he brings); progress towards remaining STG; FCTNL use of LUE   Delana Meyer, OT 08/17/2022, 2:43 PM

## 2022-08-17 NOTE — Therapy (Signed)
OUTPATIENT SPEECH LANGUAGE PATHOLOGY TREATMENT    Patient Name: Joseph Ortega MRN: [id number] DOB:1955/09/26, 67 y.o., male Today's Date: 08/17/2022  PCP: [facility] REFERRING PROVIDER: Charlton Amor, PA-C  END OF SESSION:  End of Session - 08/17/22 1211     Visit Number 6    Number of Visits 17    Date for SLP Re-Evaluation 09/06/22    Authorization Type Medicare    Activity Tolerance Patient tolerated treatment well                Past Medical History:  Diagnosis Date   Diabetes mellitus without complication (HCC)    Hypertension    Thyroid disease    Past Surgical History:  Procedure Laterality Date   HERNIA REPAIR     IR ANGIO INTRA EXTRACRAN SEL COM CAROTID INNOMINATE BILAT MOD SED  03/09/2021   IR ANGIO VERTEBRAL SEL VERTEBRAL BILAT MOD SED  03/09/2021   IR US GUIDE VASC ACCESS RIGHT  03/09/2021   LOOP RECORDER INSERTION N/A 05/07/2022   Procedure: LOOP RECORDER INSERTION;  Surgeon: Lanier Prude, MD;  Location: [facility];  Service: Cardiovascular;  Laterality: N/A;   Patient Active Problem List   Diagnosis Date Noted   Cervicalgia 08/15/2022   Hyperlipidemia 05/07/2022   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   Stroke (cerebrum) (HCC) 05/02/2022   ICH (intracerebral hemorrhage) (HCC) 03/17/2021   Hypothyroidism 03/12/2021   OSA (obstructive sleep apnea) 03/12/2021   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   Uncontrolled type 2 diabetes mellitus with hyperglycemia (HCC) 03/12/2021   Redness of both eyes 03/12/2021   Morbid obesity (HCC) 03/11/2021   Essential hypertension 03/11/2021   Tobacco abuse 03/11/2021   Herpes zoster with complication    SAH (subarachnoid hemorrhage) (HCC) 03/08/2021    ONSET DATE: 05/02/22   REFERRING DIAG: N82.956 (ICD-10-CM) - Cerebral infarction due to unspecified occlusion or stenosis of right middle cerebral artery  THERAPY DIAG:  Cognitive communication deficit  Rationale for  Evaluation and Treatment: Rehabilitation  SUBJECTIVE:   SUBJECTIVE STATEMENT: "I'm well."  PERTINENT HISTORY: Pt is a 67 y.o. male who presented with headache and chest pain. MRI brain 2/14: area of infarction with hemorrhage in the right temporoparietal region. Edema causes mass effect on the right lateral ventricle and approximately 2 mm right-to-left midline shift. PMH: stroke with left-sided weakness.   PAIN: Are you having pain? Yes: NPRS scale: 4/10 Pain location: Neck  FALLS: Has patient fallen in last 6 months?  No  LIVING ENVIRONMENT: Lives with: lives with their spouse Lives in: House/apartment  PLOF:  Level of assistance: Independent with ADLs, Independent with IADLs (Pt reports his wife is handicapped and before recent stroke he was assisting her; "now our roles are reversed")  Employment: Retired (last worked Nov 2022)  PATIENT GOALS: "Help with bills, take my meds"   OBJECTIVE:   PATIENT REPORTED OUTCOME MEASURES (PROM): Cognitive Function questionnaire (77) - Pt reports significant difficulty with recalling whether or not he has completed tasks, remembering information (such as phone numbers, instructions), and consistent difficulty with attention/focus, slow processing, multi-tasking, and these deficits negatively impact his ability to participate in ADLs, iADLS, and preferred activities. Pt endorsed he sometimes experiences anomia.   TODAY'S TREATMENT:  08/17/22: Targeted iADLs through pill sorting. Patient was able to read directions and sort pills with mon-A. Near the end of the task, patient decreased in accuracy when tasks went on for longer. Was able to check back over work with min prompting from SLP. Struggled the most with keeping types of medication straight. SLP suggested looking at the pills to double check which ones were  used. Patient states that he has a system for this at home that works well. Cognitive skills screened with MoCA and scored 20/30. Determined that patient succeeds when things are concrete.   08/01/22: Pt states that long term memory is a struggle, but that short term memory is okay. He is able to help around the house, manage his appointments with the help of a calendar on his phone.Pt wants to have more responsibility at home such as paying the bills, SLP states that demonstrating mastery of a task will allow for easy transfer of responsibilities within the home. SLP led pt through making a calendar for bills. Pt states that he would like help with organization/planning, and word finding skills. SLP recommended stopping to think about what to say before speaking, and increasing awareness of speech. Led through an exercise of looking at a picture for a minute before being asked "what is going on in this picture?" "What all do you see?" And "when do you think this happened." Pt was able to answer questions and stated that he thinks his words came out well. SLP notes that word finding decreases during moments of stress.   07/26/22: Reviewed strategies and compensations for sorting medications into pill box, reminded pt of recommendation to use pill box to increase accuracy of medication management as he returns to independent administration. Pt able to complete task with ~50% accuracy, demonstrating challenges understanding temporal instructions (e.g. take 3x a day needing to go into AM, noon, PM boxes across the week). Mod-I in implementing double check following sorting with x2 instances of error identification. Able to correct missed opportunities with mod-A from SLP. Requires A to implement strategies aimed at supporting organization during task to aid in accurate sorting.   06/12/22: SLP A pt in medication chart to aid in understanding of medications and enable successful medication management. Pt recalls 85%  of medications, 90% accuracy in recalling administration times and purpose. Pt able to generate steps for sorting medications with occasional mod-A for addition of strategies to enhance likelihood of accuracy during completion. Updated HEP to check chart, med admin worksheets.   06/11/22: Pt reported that, including managing finances and medications, he would like to return to cooking, as there are safety concerns d/t to deficits in attention and recall (example: forgetting to turn off stove). He has been teaching his grandson to cook and would like to be able to participate more. His wife has been managing all his medications for him (she is a retired Programmer, systems). During structured medication management task, pt demonstrated slow processing and required extra time to complete task. Pt independently identified 4/8 mistakes in pill sorting, but achieved 7/8 when given min to mod verbal cues when task became more complex. Pt benefited from occasional reminders to focus on one pill at a time and talk himself through the task aloud. SLP educated pt on importance of starting to organize his pills at home in order to improve in iADLs and regain his independence. Plan to complete medication sorting task during following session.   06/04/22: Assessment completed this session. Pt has 15  year-old grandson.    PATIENT EDUCATION: Education details: see above Person educated: Patient Education method: Explanation Education comprehension: verbalized understanding and needs further education   GOALS: Goals reviewed with patient? Yes  SHORT TERM GOALS: Target date:  08/23/2022 (extended at recert)  Pt will complete PROM during upcoming session.  Baseline: 77 (Cognitive Function questionnaire) Goal status: MET  2.  Pt will demonstrate increased attention to detail in structured iADL tasks with 3 or less errors exhibited given occasional min A.  Baseline:  Goal status: IN PROGRESS  3.  Pt will teach back  memory strategies with min-A. Baseline:  Goal status: IN PROGRESS  4.  Pt will carryover cognitive compensations to increase independent completion of iADLs given occasional min A in 2/2 opportunities.  Baseline:  Goal status: IN PROGRESS  5.  Pt will ID and correct any communication breakdowns that occur during ST session with occasional min-A.  Baseline:  Goal status: IN PROGRESS  LONG TERM GOALS: Target date: 09/06/2022 (new date at recert)    Pt will report subjective perception of improved cognitive function by d/c via PROM.  Baseline:  Goal status: IN PROGRESS  2.  Pt will report successful implementation of cognitive compensations in daily life to participate in iADLs. Baseline:  Goal status: IN PROGRESS  3.  Pt will demonstrate increased independence in targeted iADL tasks given rare min-A from wife.  Baseline:  Goal status: IN PROGRESS  4.  Pt will ID and correct communication breakdowns PRN with mod-I.  Baseline:  Goal status: IN PROGRESS  ASSESSMENT:  CLINICAL IMPRESSION: Skilled interventions focused on medication management this date. Recert completed, as pt unable to come to therapy for a few weeks as he transitioned to Medicare. Skilled ST is still indicated based on pt report of challenges at home. Reviewed responses on initial PROM to update current status. Extended dates on goals, as pt was unable to attend therapy, though goals still appear appropriate to A pt in improved participation and confidence in home based activities and iADLs. Continued skilled ST services are recommended to assist pt in accomplishing iADLs and to support cognitive-communication challenges.   OBJECTIVE IMPAIRMENTS: include attention, memory, and executive functioning. These impairments are limiting patient from managing medications, managing finances, household responsibilities, and ADLs/IADLs. Factors affecting potential to achieve goals and functional outcome are co-morbidities.  Patient will benefit from skilled SLP services to address above impairments and improve overall function.  REHAB POTENTIAL: Good  PLAN:  SLP FREQUENCY: 2x/week  SLP DURATION: 8 weeks (+6 weeks at recert)   PLANNED INTERVENTIONS: Language facilitation, Cognitive reorganization, Internal/external aids, Functional tasks, SLP instruction and feedback, Compensatory strategies, and Patient/family education   [place], Student-SLP 08/17/2022, 12:12 PM

## 2022-08-17 NOTE — Telephone Encounter (Signed)
Returned call to Pt's wife.  Discussed Pt's c/o recent falls.  Fall #1 was in the shower.  Wife is unsure if Pt slipped.    Fall #2 was in the driveway-wife states this was an accident.  Pt slipped on the concrete.  Per wife Pt has had recent c/o of fatigue and "feeling lethargic".    Pt has a [place] ILR with a symptom activator in the app.  Wife states they carry the phone with them.  She will activate the symptom activator when Pt is having symptoms to attempt to correlate falls with any change in Pt's heart rate.    She will call if she has any trouble working with the app.  She has the direct number to the device clinic.

## 2022-08-22 ENCOUNTER — Telehealth: Payer: Self-pay | Admitting: *Deleted

## 2022-08-22 DIAGNOSIS — M5412 Radiculopathy, cervical region: Secondary | ICD-10-CM

## 2022-08-22 DIAGNOSIS — M542 Cervicalgia: Secondary | ICD-10-CM

## 2022-08-22 MED ORDER — TRAMADOL HCL 50 MG PO TABS: 50.0000 mg | ORAL_TABLET | Freq: Three times a day (TID) | ORAL | 1 refills | Status: DC | PRN

## 2022-08-22 MED ORDER — GABAPENTIN 100 MG PO CAPS
100.0000 mg | ORAL_CAPSULE | Freq: Three times a day (TID) | ORAL | 3 refills | Status: DC
Start: 2022-08-22 — End: 2022-10-10

## 2022-08-22 NOTE — Telephone Encounter (Signed)
There is multiple level arthritis and narrowing where nerves root exit. Just received results yesterday afternoon.   Recommend MRI of cervical spine. Have ordered at DR/ [facility]  Begin trial of gabapentin 100mg  tid  Tramadol for breakthrogh pain 50mg .

## 2022-08-22 NOTE — Telephone Encounter (Signed)
Patient's wife calling to report his pain is not getting any better, pain 5/10. His blood sugar is spiking between 300-400. They would like the results of x-ray and next steps. Also wanted to know if there should be any limitations on what he can do? Call back # [phone].

## 2022-08-22 NOTE — Telephone Encounter (Signed)
Mrs. Joseph Ortega has been informed of Dr. Riley Kill reply. Please call her with the details once approved on phone number [phone]. [facility]).

## 2022-08-23 ENCOUNTER — Ambulatory Visit: Payer: Medicare HMO | Admitting: Occupational Therapy

## 2022-08-23 ENCOUNTER — Ambulatory Visit: Payer: Medicare HMO

## 2022-08-23 ENCOUNTER — Ambulatory Visit: Payer: Medicare HMO | Admitting: Speech Pathology

## 2022-08-28 ENCOUNTER — Ambulatory Visit: Payer: Medicare HMO | Admitting: Podiatry

## 2022-08-28 ENCOUNTER — Encounter: Payer: Self-pay | Admitting: Podiatry

## 2022-08-28 DIAGNOSIS — M79675 Pain in left toe(s): Secondary | ICD-10-CM

## 2022-08-28 DIAGNOSIS — M79674 Pain in right toe(s): Secondary | ICD-10-CM | POA: Diagnosis not present

## 2022-08-28 DIAGNOSIS — E119 Type 2 diabetes mellitus without complications: Secondary | ICD-10-CM

## 2022-08-28 DIAGNOSIS — E1165 Type 2 diabetes mellitus with hyperglycemia: Secondary | ICD-10-CM

## 2022-08-28 DIAGNOSIS — B351 Tinea unguium: Secondary | ICD-10-CM | POA: Diagnosis not present

## 2022-08-28 NOTE — Progress Notes (Signed)
  Subjective:  Patient ID: Joseph Ortega, male    DOB: 1956-01-31,  MRN: [id number]  Chief Complaint  Patient presents with   Nail Problem    "My toenails are like talons." N - toenails L - bilateral D - December 2022 O - gradually worse C - thick, discolored, hard to cut A - none T - none    67 y.o. male presents with the above complaint. History confirmed with patient.  He has type 2 diabetes his A1c is 8.1%.  This is on its way down from much higher elevation level.  Has had 3 strokes in the last 2 years.  Was recently fitted for a brace and shoes for this.  Objective:  Physical Exam: warm, good capillary refill, no trophic changes or ulcerative lesions, normal DP and PT pulses, and venous stasis dermatitis noted. Left Foot: dystrophic yellowed discolored nail plates with subungual debris Right Foot: dystrophic yellowed discolored nail plates with subungual debris   Assessment:   1. Pain due to onychomycosis of toenails of both feet   2. Uncontrolled type 2 diabetes mellitus with hyperglycemia (HCC)   3. Encounter for diabetic foot exam [facility])      Plan:  Patient was evaluated and treated and all questions answered.  Patient educated on diabetes. Discussed proper diabetic foot care and discussed risks and complications of disease. Educated patient in depth on reasons to return to the office immediately should he/she discover anything concerning or new on the feet. All questions answered. Discussed proper shoes as well.   May benefit from diabetic extra-depth shoes and insoles at some point.  He was recently fitted for a brace and an orthopedic type shoe.  Once he has these we will see how he does and then schedule him for casting if needed for the diabetic shoes.  Discussed the etiology and treatment options for the condition in detail with the patient.  Doubtful at this point he would be a good candidate for oral therapy and topical medications are not likely to be effective.   Recommended debridement of the nails today. Sharp and mechanical debridement performed of all painful and mycotic nails today. Nails debrided in length and thickness using a nail nipper to level of comfort. Discussed treatment options including appropriate shoe gear. Follow up as needed for painful nails.    Return in about 3 months (around 11/28/2022) for at risk diabetic foot care.

## 2022-08-30 ENCOUNTER — Ambulatory Visit: Payer: Medicare HMO | Attending: Physical Medicine & Rehabilitation | Admitting: Physical Therapy

## 2022-08-30 ENCOUNTER — Ambulatory Visit: Payer: Medicare HMO | Admitting: Physical Therapy

## 2022-08-30 ENCOUNTER — Ambulatory Visit: Payer: Medicare HMO | Admitting: Occupational Therapy

## 2022-08-30 VITALS — BP 109/63 | HR 61

## 2022-08-30 DIAGNOSIS — R278 Other lack of coordination: Secondary | ICD-10-CM | POA: Diagnosis present

## 2022-08-30 DIAGNOSIS — I69354 Hemiplegia and hemiparesis following cerebral infarction affecting left non-dominant side: Secondary | ICD-10-CM | POA: Diagnosis present

## 2022-08-30 DIAGNOSIS — R2689 Other abnormalities of gait and mobility: Secondary | ICD-10-CM | POA: Diagnosis present

## 2022-08-30 DIAGNOSIS — M6281 Muscle weakness (generalized): Secondary | ICD-10-CM | POA: Insufficient documentation

## 2022-08-30 DIAGNOSIS — R2681 Unsteadiness on feet: Secondary | ICD-10-CM | POA: Diagnosis present
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
## 2022-08-30 NOTE — Patient Instructions (Addendum)
Basic activities  Use your affected hand to perform the following activities.  Stop activity if you experience pain.  Wipe tabletop  Bring plastic cup to mouth, then return to tabletop and release  Flip playing cards  Pick up cotton balls and place in a container  Toss ball  Rotate ball in your hand  Slide checkers on tabletop  Turn doorknob  Open/close cabinet door with handle  Pick up coins and place in a container  Fold towels  Stack blocks  Pick up 1 inch blocks  Pick up a pencil  

## 2022-08-30 NOTE — Therapy (Signed)
OUTPATIENT OCCUPATIONAL THERAPY NEURO TREATMENT and PROGRESS NOTE  Patient Name: Joseph Ortega MRN: [id number] DOB:[birth year], 67 y.o., male Today's Date: 08/30/2022  PCP: Dr. Ross Ludwig REFERRING PROVIDER: Charlton Amor, PA-C  END OF SESSION:  OT End of Session - 08/30/22 0853     Visit Number 6    Number of Visits 17    Date for OT Re-Evaluation 10/19/22    Authorization Type [employer] - requires auth    Progress Note Due on Visit 15    OT Start Time 0851    OT Stop Time 0929    OT Time Calculation (min) 38 min    Activity Tolerance Patient tolerated treatment well    Behavior During Therapy [facility] for tasks assessed/performed              Past Medical History:  Diagnosis Date   Diabetes mellitus without complication (HCC)    Hypertension    Thyroid disease    Past Surgical History:  Procedure Laterality Date   HERNIA REPAIR     IR ANGIO INTRA EXTRACRAN SEL COM CAROTID INNOMINATE BILAT MOD SED  03/09/2021   IR ANGIO VERTEBRAL SEL VERTEBRAL BILAT MOD SED  03/09/2021   IR US GUIDE VASC ACCESS RIGHT  03/09/2021   LOOP RECORDER INSERTION N/A 05/07/2022   Procedure: LOOP RECORDER INSERTION;  Surgeon: Lanier Prude, MD;  Location: [facility];  Service: Cardiovascular;  Laterality: N/A;   Patient Active Problem List   Diagnosis Date Noted   Cervicalgia 08/15/2022   Hyperlipidemia 05/07/2022   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   Stroke (cerebrum) (HCC) 05/02/2022   ICH (intracerebral hemorrhage) (HCC) 03/17/2021   Hypothyroidism 03/12/2021   OSA (obstructive sleep apnea) 03/12/2021   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   Uncontrolled type 2 diabetes mellitus with hyperglycemia (HCC) 03/12/2021   Redness of both eyes 03/12/2021   Morbid obesity (HCC) 03/11/2021   Essential hypertension 03/11/2021   Tobacco abuse 03/11/2021   Herpes zoster with complication    SAH (subarachnoid hemorrhage) (HCC) 03/08/2021    ONSET DATE:  05/23/2022 (referral date)   REFERRING DIAG:  I63.511 (ICD-10-CM) - Cerebral infarction due to unspecified occlusion or stenosis of right middle cerebral artery  *New stroke 05/02/22 w/ more weakness LUE  THERAPY DIAG:  Muscle weakness (generalized)  Other lack of coordination  Hemiplegia and hemiparesis following cerebral infarction affecting left non-dominant side (HCC)  Rationale for Evaluation and Treatment: Rehabilitation  SUBJECTIVE:   SUBJECTIVE STATEMENT: Reports no new falls. He took a Tramadol before coming and it makes him feel "fuzzy".  Pt accompanied by: self  PERTINENT HISTORY:  67 y.o. right-handed male with history of hypertension as well as diabetes mellitus, CVA Dec 2022 with left-sided residual weakness maintained on low-dose aspirin.  Per chart review lives with wife.  1 level home with a ramped entrance.  Wife is reportedly limited mobility.  Presented 05/02/2022 with headache and nonspecific chest pain.  Cranial CT scan showed acute hemorrhagic infarction affecting the right posterior parietal lobe.   PRECAUTIONS: Fall and Other: loop recorder, no driving  WEIGHT BEARING RESTRICTIONS: No  PAIN:  Are you having pain? Yes: NPRS scale: 2/10 Pain location: neck into R shoulder Pain description: sore Aggravating factors: movement; touch Relieving factors: rest  FALLS: Has patient fallen in last 6 months? Yes though unable to confirm number of falls  LIVING ENVIRONMENT: Lives with: lives with their spouse Pt lives in 1 story home with ramped entrance Has following equipment  at home: Quad cane large base, Environmental consultant - 2 wheeled, Environmental consultant - 4 wheeled, Wheelchair (manual), bed side commode, Grab bars, Ramped entry, and rollator, built in shower seat  PLOF: Independent and prior to Dec 2022, working on getting long term disability, likes to [employer]  PATIENT GOALS: get my Lt arm better, use a cane instead of rollator  OBJECTIVE:   HAND DOMINANCE:  Right  ADLs: Eating: mod I but needs assist to cut food Grooming: mod I UB Dressing: mod I  LB Dressing: mod I  Toileting: mod I  Bathing: min assist for washing back and rinsing (seated)  Tub Shower transfers: mod I w/ walk in shower, 2 grab bars Equipment:  see above  IADLs: Shopping: dependent (wife or grandson doing)  Light housekeeping: pt does laundry but needs assist to fold Meal Prep: dependent (was max assist prior to new stroke)  Community mobility: relies on family for transportation Medication management: wife assist (pt was independent prior to new stroke)  Financial management: same as meds management Handwriting:  denies change  MOBILITY STATUS:  rollator in community, RW in house   UPPER EXTREMITY ROM:  RUE AROM WNL's.  LUE: Dominated by synergy pattern w/ min shoulder movement going into abduction w/ no distal control. However distally (isolated), pt has supination to neutral, 75% wrist ext, 75% full composite flex, and 90% full composite ext.    HAND FUNCTION: Grip strength: Right: 88.6 lbs; Left: 23.5 lbs 08/01/2022: no change  COORDINATION: Pt could pick up block from lower surface w/ gross grasp, but unable to do Box & Blocks d/t limited shoulder movement and distal control  SENSATION: WFL per pt report  EDEMA: none during eval, minimal at times per pt report  MUSCLE TONE: LUE: Hypertonic  COGNITION: Overall cognitive status:  memory changes since first stroke in Dec 2022, worse since new stroke  VISION: Subjective report: I lost my peripheral vision on my Lt side with this new stroke (pt had Lt lower quadrant loss from first stroke)  Baseline vision: Wears glasses for reading only Visual history: retinopathy and corrective eye surgery  VISION ASSESSMENT: Ocular ROM: WFL Tracking/Visual pursuits: Able to track stimulus in all quads without difficulty Convergence: WFL Visual Fields: Left visual field deficits   PERCEPTION: Not  tested  PRAXIS: Not tested  OBSERVATIONS: new Lt visual field loss, slightly weaker LUE however presents the same as eval prior to new stroke   TODAY'S TREATMENT:                                                                                                                              - Therapeutic activities completed for duration as noted below including: OT initiated basic activities as noted in patient instructions to promote more frequent and functional use of LUE.  Patient encouraged not to utilize RUE during this time and complete this activity list for 15 to 30 minutes at a time on a daily basis.  OT educating on task modifications including use of Dycem to help stabilize items for more controlled manipulation. OT placed 6 BlazePods in front of patient and had patient tap pods with L hand and bimanually as pods lit up using random mode for improved processing, scanning and locating of items, reaction time, upper extremity range of motion, gross motor coordination, and bimanual coordination/trunk control.  His hit times were as follows:  L hand -  20 hits; 5.6 seconds Bimanually - 76 hits; 1.4 seconds  PATIENT EDUCATION: Education details: LUE use; therapy extension Person educated: Patient Education method: Explanation and Demonstration Education comprehension: verbalized understanding, returned demonstration, and needs further education  HOME EXERCISE PROGRAM: 04/17/22: Review of previously issued HEP (prior to new stroke - however still appropriate)  07/24/22: LUE HEP  08/01/2022: L red putty HEP  GOALS: Goals reviewed with patient? Yes  SHORT TERM GOALS:   Independent with initial HEP and visual scanning strategies Baseline: Goal status: MET  2.  Pt will verbalize understanding of A/E needs to increase safety and independence with cutting food, cooking, etc Baseline:  Goal status: MET  3.  Pt will verbalize understanding with memory compensatory strategies  Baseline:   Goal status: MET  4.  Pt to use Lt hand as stabilizer for bilateral tasks Baseline:  Goal status: INITIAL   LONG TERM GOALS: Target date: 10/19/22  Pt will be independent with updated HEP for LUE Baseline:  Goal status: INITIAL  2.  Pt will improve LUE function as being able to perform 5 blocks on Box & Blocks test Baseline: unable 08/30/2022 - 5 blocks Goal status: MET  3.  Pt will improve UEFS to 46% or higher Baseline: 43% on previous eval in Jan 2024 Goal status: INITIAL  4.  Pt will be able to prepare simple meal with direct supervision Baseline:  Goal status: MET  5.  Pt will demo 30* sh flexion LUE for low level reaching Baseline: no true sh flex, approx 60* abduction w/ no distal control Goal status: MET  6.  Pt will perform tabletop and environmental scanning safely with at least 75% accuracy Baseline:  Goal status: INITIAL  ASSESSMENT:  CLINICAL IMPRESSION: This 5th progress note is for dates: 06/04/2022 to 08/30/2022. Pt has met 3/4 STGs and 2/6 LTGs. Pt making progress towards goals as expected and continues to benefit from skilled OT services in the outpatient setting to work towards remaining goals or until max rehab potential is met.   PERFORMANCE DEFICITS: in functional skills including ADLs, IADLs, coordination, dexterity, tone, ROM, pain, Fine motor control, Gross motor control, mobility, balance, body mechanics, decreased knowledge of use of DME, and UE functional use, cognitive skills including memory, and psychosocial skills including coping strategies.   IMPAIRMENTS: are limiting patient from ADLs, IADLs, leisure, and social participation.   CO-MORBIDITIES: may have co-morbidities  that affects occupational performance. Patient will benefit from skilled OT to address above impairments and improve overall function.  REHAB POTENTIAL: Good  PLAN:  OT FREQUENCY: 2x/week  OT DURATION: additional 8 weeks   PLANNED INTERVENTIONS: self care/ADL  training, therapeutic exercise, therapeutic activity, neuromuscular re-education, manual therapy, passive range of motion, functional mobility training, electrical stimulation, moist heat, patient/family education, cognitive remediation/compensation, visual/perceptual remediation/compensation, energy conservation, coping strategies training, and DME and/or AE instructions  RECOMMENDED OTHER SERVICES: none at this time  CONSULTED AND AGREED WITH PLAN OF CARE: Patient  PLAN FOR NEXT SESSION: review putty HEP (if he brings) and FCTNL use of LUE; progress  towards remaining STG;    Delana Meyer, OT 08/30/2022, 2:36 PM

## 2022-09-02 ENCOUNTER — Ambulatory Visit
Admission: RE | Admit: 2022-09-02 | Discharge: 2022-09-02 | Disposition: A | Discharge status: Home / Self Care | Payer: Medicare HMO | Source: Ambulatory Visit | Attending: Physical Medicine & Rehabilitation | Admitting: Physical Medicine & Rehabilitation

## 2022-09-02 DIAGNOSIS — M5412 Radiculopathy, cervical region: Secondary | ICD-10-CM

## 2022-09-02 DIAGNOSIS — M542 Cervicalgia: Secondary | ICD-10-CM

## 2022-09-06 NOTE — Progress Notes (Signed)
[facility] Loop Recorder 

## 2022-09-07 ENCOUNTER — Telehealth: Payer: Self-pay | Admitting: *Deleted

## 2022-09-07 ENCOUNTER — Ambulatory Visit: Payer: Medicare HMO | Admitting: Occupational Therapy

## 2022-09-07 ENCOUNTER — Ambulatory Visit: Payer: Medicare HMO | Admitting: Physical Therapy

## 2022-09-07 NOTE — Telephone Encounter (Signed)
I called and spoke to his wife. Will double coreg to start. Can also increase hydrochlorothiazide to 25mg . Reviewed MRI results as well. Full reading is not yet available. May need surgical consult.

## 2022-09-07 NOTE — Telephone Encounter (Signed)
Requesting bp medication because patient's bp has been elevated this week 190/90s 200/90s. Patient does not want to go to ER. They haven't contacted pcp b/c they don't feel comfortable with his/her care.  Also would like MRI results  I've attempted to call back to make aware Dr.Swartz not in clinic today and will be on vacation. Message routed as FYI.

## 2022-09-11 ENCOUNTER — Telehealth: Payer: Self-pay

## 2022-09-11 MED ORDER — HYDROCHLOROTHIAZIDE 12.5 MG PO TABS: 12.5000 mg | ORAL_TABLET | Freq: Two times a day (BID) | ORAL | 0 refills | Status: DC

## 2022-09-11 MED ORDER — CARVEDILOL 12.5 MG PO TABS: 12.5000 mg | ORAL_TABLET | Freq: Two times a day (BID) | ORAL | 0 refills | Status: DC

## 2022-09-11 NOTE — Telephone Encounter (Signed)
Refill request

## 2022-09-13 ENCOUNTER — Ambulatory Visit: Payer: Medicare HMO | Admitting: Occupational Therapy

## 2022-09-13 ENCOUNTER — Ambulatory Visit: Payer: Medicare HMO | Admitting: Physical Therapy

## 2022-09-13 ENCOUNTER — Ambulatory Visit (INDEPENDENT_AMBULATORY_CARE_PROVIDER_SITE_OTHER): Payer: Medicare HMO

## 2022-09-13 VITALS — BP 147/74 | HR 62

## 2022-09-13 DIAGNOSIS — R2689 Other abnormalities of gait and mobility: Secondary | ICD-10-CM

## 2022-09-13 DIAGNOSIS — I69354 Hemiplegia and hemiparesis following cerebral infarction affecting left non-dominant side: Secondary | ICD-10-CM

## 2022-09-13 DIAGNOSIS — M6281 Muscle weakness (generalized): Secondary | ICD-10-CM

## 2022-09-13 DIAGNOSIS — I639 Cerebral infarction, unspecified: Secondary | ICD-10-CM | POA: Diagnosis not present

## 2022-09-13 DIAGNOSIS — R2681 Unsteadiness on feet: Secondary | ICD-10-CM

## 2022-09-13 DIAGNOSIS — R278 Other lack of coordination: Secondary | ICD-10-CM

## 2022-09-13 NOTE — Therapy (Signed)
OUTPATIENT PHYSICAL THERAPY NEURO TREATMENT   Patient Name: Joseph Ortega MRN: [id number] DOB:[birth year], 67 y.o., male Today's Date: 09/13/2022   PCP: [facility] (looking for new PCP) REFERRING PROVIDER: Ranelle Oyster, MD   END OF SESSION:  PT End of Session - 09/13/22 1450     Visit Number 4    Number of Visits 13    Date for PT Re-Evaluation 10/18/22    Authorization Type [employer] medicare    Progress Note Due on Visit 10    PT Start Time 1442    PT Stop Time 1535    PT Time Calculation (min) 53 min    Equipment Utilized During Treatment Gait belt    Activity Tolerance Patient tolerated treatment well    Behavior During Therapy WFL for tasks assessed/performed               Past Medical History:  Diagnosis Date   Diabetes mellitus without complication (HCC)    Hypertension    Thyroid disease    Past Surgical History:  Procedure Laterality Date   HERNIA REPAIR     IR ANGIO INTRA EXTRACRAN SEL COM CAROTID INNOMINATE BILAT MOD SED  03/09/2021   IR ANGIO VERTEBRAL SEL VERTEBRAL BILAT MOD SED  03/09/2021   IR US GUIDE VASC ACCESS RIGHT  03/09/2021   LOOP RECORDER INSERTION N/A 05/07/2022   Procedure: LOOP RECORDER INSERTION;  Surgeon: Lanier Prude, MD;  Location: [facility];  Service: Cardiovascular;  Laterality: N/A;   Patient Active Problem List   Diagnosis Date Noted   Cervicalgia 08/15/2022   Hyperlipidemia 05/07/2022   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   Stroke (cerebrum) (HCC) 05/02/2022   ICH (intracerebral hemorrhage) (HCC) 03/17/2021   Hypothyroidism 03/12/2021   OSA (obstructive sleep apnea) 03/12/2021   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   Uncontrolled type 2 diabetes mellitus with hyperglycemia (HCC) 03/12/2021   Redness of both eyes 03/12/2021   Morbid obesity (HCC) 03/11/2021   Essential hypertension 03/11/2021   Tobacco abuse 03/11/2021   Herpes zoster with complication    SAH (subarachnoid  hemorrhage) (HCC) 03/08/2021    ONSET DATE:   07/20/2022  referral  REFERRING DIAG: N82.956 (ICD-10-CM) - Cerebral infarction due to occlusion of right middle cerebral artery (HCC)   THERAPY DIAG:  Muscle weakness (generalized)  Unsteadiness on feet  Other abnormalities of gait and mobility  Rationale for Evaluation and Treatment: Rehabilitation  SUBJECTIVE:  SUBJECTIVE STATEMENT: Pt reports that he told his PCP about ongoing elevated BP and his PCP was very dismissive, pt looking to find a new PCP. Pt reached out to Dr. Riley Kill who adjusted his BP meds appropriately. Pt reports that his BP has been better controlled lately.  Pt has been taking tramadol for his neck and his pain has been better, he has started to decrease how much he is taking. Pt had MRI done of cervical spine, awaiting to discuss results with Dr. Riley Kill.  Pt accompanied by: self  PERTINENT HISTORY: DM, HTN, Thyroid disease and prior CVA 12/22 with residual L side weakness. Admitted on 05/02/22 due to HA, CP, and found to have hemorrhage in the right temporoparietal region with 2mm L to R midline shift.   PAIN:  Are you having pain? No  VITALS Vitals:   09/13/22 1502  BP: (!) 147/74  Pulse: 62      PRECAUTIONS: Fall  PATIENT GOALS: "get to walking on a cane and get this arm working as much as I can"  OBJECTIVE:   DIAGNOSTIC FINDINGS:   Cervical Spine MRI scheduled for 09/02/22  Cervical Spine Xray 08/16/22 FINDINGS: There is no evidence of cervical spine fracture or prevertebral soft tissue swelling. Alignment is normal. Mild intervertebral disc space narrowing and degenerative endplate changes are seen at multiple levels and most pronounced at C5-C6. There is neural foraminal narrowing at C3-C4, C4-C5, and  C5-C6 on the right and C3-C4 C4-C5 on the left.   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   MRI of brain on 05/02/22   IMPRESSION: 1. Evaluation is limited by motion artifact. In addition, the MRI head could not be completed in its entirety. 2. Redemonstrated area of infarction with hemorrhage in the right temporoparietal region, which appears overall similar to the same day CT, when allowing for differences in technique. Edema causes mass effect on the right lateral ventricle and approximately 2 mm right-to-left midline shift. 3. No intracranial large vessel occlusion. Suspect severe stenosis in the left P2, although evaluation is limited by motion. 4. No hemodynamically significant stenosis in the neck.   TODAY'S TREATMENT:                  TherAct Seated BP assessed at beginning of therapy session, see above. BP within safe limits to participate in therapy session this date.  Provided information of several new PCPs that pt can look into.   Gait Gait pattern: decreased hip/knee flexion- Left, decreased ankle dorsiflexion- Left, genu recurvatum- Left, and poor foot clearance- Left Distance walked: 115 ft Assistive device utilized: Walker - 4 wheeled Level of assistance: SBA Comments: with use of rollator and no additional bracing, also exhibits L hip ER, occasional catching of L foot  Gait pattern: decreased hip/knee flexion- Left Distance walked: 115 ft Assistive device utilized: Walker - 4 wheeled Level of assistance: SBA Comments: with DF assist ACE wrap to L ankle, improved foot clearance as well as improved hip/knee flexion, pt also paying more attention to limb during this lap  Gait pattern: step to pattern, decreased hip/knee flexion- Left, and decreased ankle dorsiflexion- Left Distance walked: 115 ft Assistive device utilized: Single point cane Level of assistance: Min A Comments: step to gait pattern, decreased gait  speed, increased energy expenditure required; occasional LOB requiring min A to recover  PATIENT EDUCATION: Education details: continue HEP, bring AFO and cane to next session, try and find a new PCP Person educated: Patient Education method: Explanation and Handouts Education comprehension: verbalized understanding and needs further education  HOME EXERCISE PROGRAM: To be reviewed from previous bout of therapy  Access Code: [email or web address] URL: [email or web address] Date: 04/03/2022 Prepared by: Peter Congo   Exercises - Sit to Stand Without Arm Support  - 1 x daily - 7 x weekly - 3 sets - 10 reps - Mini Squat with Counter Support  - 1 x daily - 7 x weekly - 3 sets - 10 reps - Standing Hip Extension with Unilateral Counter Support  - 1 x daily - 7 x weekly - 3 sets - 10 reps - Standing Hip Abduction with Unilateral Counter Support  - 1 x daily - 7 x weekly - 3 sets - 10 reps - Seated Ankle Alphabet  - 1 x daily - 7 x weekly - 1 sets - 1 reps  GOALS:  Goals reviewed with patient? Yes   SHORT TERM GOALS: Target date: 08/24/22   1. Pt will be independent with independent HEP for improved strength, balance, transfers and gait.  Baseline: to be reviewed from prior bout of therapy  Goal status: MET   2.  Pt will improve gait velocity to at least .77m/s with LRAD for improved gait efficiency   Baseline: .67m/s , 0.47 ft/sec with rollator (6/13) Goal status: MET   3.  Pt will improve 5 x STS to less than or equal to 15 seconds w/RUE support and good form to demonstrate improved functional strength and transfer efficiency.   Baseline:  20.97s with R UE, 17.38 sec with RUE (6/13)  Goal status: IN PROGRESS  4.  Pt will improve normal TUG to less than or equal to 27 seconds w/LRAD for improved functional mobility and decreased fall risk.   Baseline: 33.18s with rollator, 32 sec with rollator (6/13)  Goal status:  IN PROGRESS  5.  Patient will improve BBS score to >/= 27/56 to demonstrate improved balance  Baseline: 20/56, 27/56 (6/13)  Goal status: MET   LONG TERM GOALS: Target date: 09/21/22    1. Pt will be independent with final HEP for improved strength, balance, transfers and gait.   Baseline:  Goal status: INITIAL   2.  Pt will improve gait velocity to at least .77m/s with LRAD for improved gait efficiency   Baseline: .53m/s, 0.47 ft/sec with rollator (6/13)  Goal status: GOAL UPDATED  3.  Pt will improve 5 x STS to less than or equal to 12 seconds w/RUE support and proper form to demonstrate improved functional strength and transfer efficiency.   Baseline: 20.97s with R UE,  17.38 sec with RUE (6/13)  Goal status: INITIAL   4.  Patient will improve BBS score to >/=36/56 to demonstrate improved balance  Baseline: 20/56, 27/56 (6/13)   Goal status: INITIAL   5.  Pt will improve normal TUG to less than or equal to 23 seconds w/LRAD for improved functional mobility and decreased fall risk.   Baseline: 33.18s with rollator, 32 sec with rollator (6/13)   Goal status: INITIAL    ASSESSMENT:   CLINICAL IMPRESSION:  Emphasis of skilled PT session on assessing gait with rollator vs SPC and with and without DF assist ACE wrap as pt forgets to bring his custom AFO this session. Pt continues to exhibit gait impairments as noted above with his LLE and exhibits decreased balance and  increased energy expenditure required with use of SPC as compared to rollator. Encouraged pt to bring his AFO next session as he still benefits from use of this brace for improved LLE gait mechanics. Pt is not safe to ambulate independently with SPC at this time, encouraged him to continue to use his rollator at home and he is in agreement. Continue POC.    OBJECTIVE IMPAIRMENTS: Abnormal gait, decreased activity tolerance, decreased balance, decreased coordination, decreased endurance, decreased knowledge of use  of DME, difficulty walking, decreased strength, decreased safety awareness, and impaired UE functional use   ACTIVITY LIMITATIONS: carrying, lifting, bending, standing, squatting, stairs, bed mobility, reach over head, locomotion level, and caring for others   PARTICIPATION LIMITATIONS: meal prep, cleaning, laundry, medication management, interpersonal relationship, driving, shopping, community activity, occupation, and yard work   PERSONAL FACTORS: Fitness, Past/current experiences, Time since onset of injury/illness/exacerbation, Transportation, and 1-2 comorbidities: HTN and multiple CVA  are also affecting patient's functional outcome.   REHAB POTENTIAL: Fair due to multiple CVAs and lack of support at home   CLINICAL DECISION MAKING: Evolving/moderate complexity   EVALUATION COMPLEXITY: Moderate   PLAN:   PT FREQUENCY: 1x/week per patient request   PT DURATION: 12 weeks   PLANNED INTERVENTIONS: Therapeutic exercises, Therapeutic activity, Neuromuscular re-education, Balance training, Gait training, Patient/Family education, Self Care, Joint mobilization, Stair training, Orthotic/Fit training, DME instructions, Aquatic Therapy, Dry Needling, Manual therapy, and Re-evaluation   PLAN FOR NEXT SESSION: Review previous HEP and update to ensure it is safe. L NMR and global strength, floor transfers, gait with and without AFO to compare as pt likely does still need AFO due to decreased LLE strength and decreased attention to limb    Peter Congo, PT, DPT, CSRS  09/13/2022, 3:35 PM

## 2022-09-13 NOTE — Patient Instructions (Signed)
L thumb AROM  Access Code: [email or web address] URL: [email or web address] Date: 09/13/2022 Prepared by: Amada Kingfisher  Exercises - Thumb Abduction AROM on Table  - 1 x daily - 3 sets - 5-10 reps - Thumb Opposition  - 1 x daily - 3 sets - 5-10 reps - Thumb Stabilization: "C" Position Isometric Around Ball  - 1 x daily - 3 sets - 5-10 reps

## 2022-09-13 NOTE — Therapy (Signed)
OUTPATIENT OCCUPATIONAL THERAPY NEURO TREATMENT  Patient Name: Joseph Ortega MRN: [id number] DOB:[birth year], 67 y.o., male Today's Date: 09/13/2022  PCP: Dr. Ross Ludwig REFERRING PROVIDER: Charlton Amor, PA-C  END OF SESSION:  OT End of Session - 09/13/22 1319     Visit Number 7    Number of Visits 17    Date for OT Re-Evaluation 10/19/22    Authorization Type [employer] - requires auth    Progress Note Due on Visit 15    OT Start Time 1317    OT Stop Time 1402    OT Time Calculation (min) 45 min    Activity Tolerance Patient tolerated treatment well    Behavior During Therapy WFL for tasks assessed/performed              Past Medical History:  Diagnosis Date   Diabetes mellitus without complication (HCC)    Hypertension    Thyroid disease    Past Surgical History:  Procedure Laterality Date   HERNIA REPAIR     IR ANGIO INTRA EXTRACRAN SEL COM CAROTID INNOMINATE BILAT MOD SED  03/09/2021   IR ANGIO VERTEBRAL SEL VERTEBRAL BILAT MOD SED  03/09/2021   IR US GUIDE VASC ACCESS RIGHT  03/09/2021   LOOP RECORDER INSERTION N/A 05/07/2022   Procedure: LOOP RECORDER INSERTION;  Surgeon: Lanier Prude, MD;  Location: [facility];  Service: Cardiovascular;  Laterality: N/A;   Patient Active Problem List   Diagnosis Date Noted   Cervicalgia 08/15/2022   Hyperlipidemia 05/07/2022   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   Stroke (cerebrum) (HCC) 05/02/2022   ICH (intracerebral hemorrhage) (HCC) 03/17/2021   Hypothyroidism 03/12/2021   OSA (obstructive sleep apnea) 03/12/2021   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   Uncontrolled type 2 diabetes mellitus with hyperglycemia (HCC) 03/12/2021   Redness of both eyes 03/12/2021   Morbid obesity (HCC) 03/11/2021   Essential hypertension 03/11/2021   Tobacco abuse 03/11/2021   Herpes zoster with complication    SAH (subarachnoid hemorrhage) (HCC) 03/08/2021    ONSET DATE: 05/23/2022 (referral  date)   REFERRING DIAG:  I63.511 (ICD-10-CM) - Cerebral infarction due to unspecified occlusion or stenosis of right middle cerebral artery  *New stroke 05/02/22 w/ more weakness LUE  THERAPY DIAG:  Other lack of coordination  Muscle weakness (generalized)  Hemiplegia and hemiparesis following cerebral infarction affecting left non-dominant side (HCC)  Rationale for Evaluation and Treatment: Rehabilitation  SUBJECTIVE:   SUBJECTIVE STATEMENT:  He takes Tramadol for neck pain on occasion but did not have any today. Goes back to Dr.Swartz re: spinal stenosis 10/10/22.   Pt accompanied by: self  PERTINENT HISTORY:  67 y.o. right-handed male with history of hypertension as well as diabetes mellitus, CVA Dec 2022 with left-sided residual weakness maintained on low-dose aspirin.  Per chart review lives with wife.  1 level home with a ramped entrance.  Wife is reportedly limited mobility.  Presented 05/02/2022 with headache and nonspecific chest pain.  Cranial CT scan showed acute hemorrhagic infarction affecting the right posterior parietal lobe.   PRECAUTIONS: Fall and Other: loop recorder, no driving  WEIGHT BEARING RESTRICTIONS: No  PAIN:  Are you having pain? Yes: NPRS scale: 2/10 Pain location: neck into R shoulder Pain description: sore Aggravating factors: movement; touch Relieving factors: rest  and medication  FALLS: Has patient fallen in last 6 months? Yes though unable to confirm number of falls  LIVING ENVIRONMENT: Lives with: lives with their spouse Pt lives in 1  story home with ramped entrance Has following equipment at home: Quad cane large base, Environmental consultant - 2 wheeled, Environmental consultant - 4 wheeled, Wheelchair (manual), bed side commode, Grab bars, Ramped entry, and rollator, built in shower seat  PLOF: Independent and prior to Dec 2022, working on getting long term disability, likes to [employer]  PATIENT GOALS: get my Lt arm better, use a cane instead of rollator  OBJECTIVE:    HAND DOMINANCE: Right  ADLs: Eating: mod I but needs assist to cut food Grooming: mod I UB Dressing: mod I  LB Dressing: mod I  Toileting: mod I  Bathing: min assist for washing back and rinsing (seated)  Tub Shower transfers: mod I w/ walk in shower, 2 grab bars Equipment:  see above  IADLs: Shopping: dependent (wife or grandson doing)  Light housekeeping: pt does laundry but needs assist to fold Meal Prep: dependent (was max assist prior to new stroke)  Community mobility: relies on family for transportation Medication management: wife assist (pt was independent prior to new stroke)  Financial management: same as meds management Handwriting:  denies change  MOBILITY STATUS:  rollator in community, RW in house   UPPER EXTREMITY ROM:  RUE AROM WNL's.  LUE: Dominated by synergy pattern w/ min shoulder movement going into abduction w/ no distal control. However distally (isolated), pt has supination to neutral, 75% wrist ext, 75% full composite flex, and 90% full composite ext.    HAND FUNCTION: Grip strength: Right: 88.6 lbs; Left: 23.5 lbs 08/01/2022: no change  COORDINATION: Pt could pick up block from lower surface w/ gross grasp, but unable to do Box & Blocks d/t limited shoulder movement and distal control  SENSATION: WFL per pt report  EDEMA: none during eval, minimal at times per pt report  MUSCLE TONE: LUE: Hypertonic  COGNITION: Overall cognitive status:  memory changes since first stroke in Dec 2022, worse since new stroke  VISION: Subjective report: I lost my peripheral vision on my Lt side with this new stroke (pt had Lt lower quadrant loss from first stroke)  Baseline vision: Wears glasses for reading only Visual history: retinopathy and corrective eye surgery  VISION ASSESSMENT: Ocular ROM: WFL Tracking/Visual pursuits: Able to track stimulus in all quads without difficulty Convergence: WFL Visual Fields: Left visual field  deficits   PERCEPTION: Not tested  PRAXIS: Not tested  OBSERVATIONS: new Lt visual field loss, slightly weaker LUE however presents the same as eval prior to new stroke   TODAY'S TREATMENT:                                                                                                                               - Orthotic Due to patient's concerns about thumb position during grasping activities, OTR explore options for thumb position with wrist strap with thumb strap through webspace with some success but most improvement noted through a size 15 Oval 8 splint on thumb to prevent IP flexion.  Education provided on application, removal and skin checks for patient to use it at home with exercises and activities involving grasp with L UE.  - Therapeutic activities completed for duration as noted below including: Patient engaged in picking up objects with L hand including blocks with and without thumb splint to work on tip to tip pinch/grasp VS finger to dorsal aspect of thumb.  Task is significantly improved with use of Oval 8 splint to extend thumb IP joint. He is encouraged to work on closing fingers with his thumb on the outside of the index finger, progress to finger opposition and worked on picking up objects with and without wrist support from OTR to isolate tripod and pincer grasp, minimize shoulder compensation and focus on distal control. Also worked on holding a Brewing technologist (like a pudding cup) in L hand and scooping dry beans out of it, each time returning forearm to neutral position (actively supinating) to prevent spilling from container when arm pronates. - Therapeutic exercises Introduced thumb Exercises to improve motor control for functional use of L hand ie) to hold various containers, objects and improve thumb position around things - Thumb Abduction AROM on Table  - 1 x daily - 3 sets - 5-10 reps ie) hand flat on table and moving thumb individually out and up. - Thumb  Opposition  - 1 x daily - 3 sets - 5-10 reps ie) touching fingertip to thumb with focus on index finger as long finger is difficult to isolate at this time - Thumb Stabilization: "C" Position Isometric Around Ball  - 1 x daily - 3 sets - 5-10 reps PATIENT EDUCATION: Education details: L thumb ROM and Oval 8 splint for thumb Person educated: Patient Education method: Explanation, Demonstration, Tactile cues, and Handouts Education comprehension: verbalized understanding, returned demonstration, and needs further education  HOME EXERCISE PROGRAM: 04/17/22: Review of previously issued HEP (prior to new stroke - however still appropriate)  07/24/22: LUE HEP  08/01/2022: L red putty HEP 09/13/22 Access Code: [email or web address] - Thumb ROM Prepared by: Amada Kingfisher   GOALS: Goals reviewed with patient? Yes  SHORT TERM GOALS:   Independent with initial HEP and visual scanning strategies Baseline: Goal status: MET  2.  Pt will verbalize understanding of A/E needs to increase safety and independence with cutting food, cooking, etc Baseline:  Goal status: MET  3.  Pt will verbalize understanding with memory compensatory strategies  Baseline:  Goal status: MET  4.  Pt to use Lt hand as stabilizer for bilateral tasks Baseline:  Goal status: IN PROGRESS   LONG TERM GOALS: Target date: 10/19/22  Pt will be independent with updated HEP for LUE Baseline:  Goal status: IN PROGRESS  2.  Pt will improve LUE function as being able to perform 5 blocks on Box & Blocks test Baseline: unable 08/30/2022 - 5 blocks Goal status: REVISED 09/14/22 - 10 blocks in 1 minute  3.  Pt will improve UEFS to 46% or higher Baseline: 43% on previous eval in Jan 2024 Goal status: REVISED  4.  Pt will be able to prepare simple meal with direct supervision Baseline:  Goal status: MET  5.  Pt will demo 30* sh flexion LUE for low level reaching Baseline: no true sh flex, approx 60* abduction w/ no distal control Goal  status: MET  6.  Pt will perform tabletop and environmental scanning safely with at least 75% accuracy Baseline:  Goal status: IN PROGRESS  ASSESSMENT:  CLINICAL IMPRESSION: Patient enjoyed activities  with thumb splint on today to help improve pinch and use of LUE to pick up items.  Education provided on use of splint and skin protection as it is tight ie) to keep IP joint from bending.  He continues to benefit from skilled OT services in the outpatient setting to work towards remaining goals or until max rehab potential is met with L UE use s/p hemiplegia, weakness and ROM/coordination deficits.   PERFORMANCE DEFICITS: in functional skills including ADLs, IADLs, coordination, dexterity, tone, ROM, pain, Fine motor control, Gross motor control, mobility, balance, body mechanics, decreased knowledge of use of DME, and UE functional use, cognitive skills including memory, and psychosocial skills including coping strategies.   IMPAIRMENTS: are limiting patient from ADLs, IADLs, leisure, and social participation.   CO-MORBIDITIES: may have co-morbidities  that affects occupational performance. Patient will benefit from skilled OT to address above impairments and improve overall function.  REHAB POTENTIAL: Good  PLAN:  OT FREQUENCY: 2x/week  OT DURATION: additional 8 weeks   PLANNED INTERVENTIONS: self care/ADL training, therapeutic exercise, therapeutic activity, neuromuscular re-education, manual therapy, passive range of motion, functional mobility training, electrical stimulation, moist heat, patient/family education, cognitive remediation/compensation, visual/perceptual remediation/compensation, energy conservation, coping strategies training, and DME and/or AE instructions  RECOMMENDED OTHER SERVICES: none at this time  CONSULTED AND AGREED WITH PLAN OF CARE: Patient  PLAN FOR NEXT SESSION:   Check how ROM and use of Oval 8 splint for L thumb are going.  Continue to review/update  putty HEP (if he brings) and progress AROM/coordination of L hand for functional activities  Any AE to ensure max ease/safety with ADLs/IADLS  Progress towards remaining goals.   Victorino Sparrow, OT 09/13/2022, 2:24 PM

## 2022-09-14 ENCOUNTER — Telehealth: Payer: Self-pay

## 2022-09-14 LAB — CUP PACEART REMOTE DEVICE CHECK
Date Time Interrogation Session: 20240628020228
Implantable Pulse Generator Implant Date: 20240219
Pulse Gen Serial Number: 511022381

## 2022-09-14 NOTE — Telephone Encounter (Signed)
First tachy episode noted on loop. LMTCB.

## 2022-09-21 ENCOUNTER — Ambulatory Visit: Payer: Medicare HMO | Admitting: Occupational Therapy

## 2022-09-21 ENCOUNTER — Ambulatory Visit: Payer: Medicare HMO | Admitting: Physical Therapy

## 2022-09-26 NOTE — Therapy (Signed)
OUTPATIENT OCCUPATIONAL THERAPY NEURO TREATMENT  Patient Name: Joseph Ortega MRN: [id number] DOB:1956/01/17, 67 y.o., male Today's Date: 09/27/2022  PCP: Dr. Ross Ludwig REFERRING PROVIDER: Charlton Amor, PA-C  END OF SESSION:  OT End of Session - 09/27/22 1455     Visit Number 8    Number of Visits 17    Date for OT Re-Evaluation 10/19/22    Authorization Type [employer] - requires auth    Progress Note Due on Visit 15    OT Start Time 1453    OT Stop Time 1531    OT Time Calculation (min) 38 min    Activity Tolerance Patient tolerated treatment well    Behavior During Therapy WFL for tasks assessed/performed              Past Medical History:  Diagnosis Date   Diabetes mellitus without complication (HCC)    Hypertension    Thyroid disease    Past Surgical History:  Procedure Laterality Date   HERNIA REPAIR     IR ANGIO INTRA EXTRACRAN SEL COM CAROTID INNOMINATE BILAT MOD SED  03/09/2021   IR ANGIO VERTEBRAL SEL VERTEBRAL BILAT MOD SED  03/09/2021   IR US GUIDE VASC ACCESS RIGHT  03/09/2021   LOOP RECORDER INSERTION N/A 05/07/2022   Procedure: LOOP RECORDER INSERTION;  Surgeon: Lanier Prude, MD;  Location: [facility];  Service: Cardiovascular;  Laterality: N/A;   Patient Active Problem List   Diagnosis Date Noted   Cervicalgia 08/15/2022   Hyperlipidemia 05/07/2022   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   Stroke (cerebrum) (HCC) 05/02/2022   ICH (intracerebral hemorrhage) (HCC) 03/17/2021   Hypothyroidism 03/12/2021   OSA (obstructive sleep apnea) 03/12/2021   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   Uncontrolled type 2 diabetes mellitus with hyperglycemia (HCC) 03/12/2021   Redness of both eyes 03/12/2021   Morbid obesity (HCC) 03/11/2021   Essential hypertension 03/11/2021   Tobacco abuse 03/11/2021   Herpes zoster with complication    SAH (subarachnoid hemorrhage) (HCC) 03/08/2021    ONSET DATE: 05/23/2022 (referral  date)   REFERRING DIAG:  I63.511 (ICD-10-CM) - Cerebral infarction due to unspecified occlusion or stenosis of right middle cerebral artery  *New stroke 05/02/22 w/ more weakness LUE  THERAPY DIAG:  Muscle weakness (generalized)  Hemiplegia and hemiparesis following cerebral infarction affecting left non-dominant side (HCC)  Other lack of coordination  Chronic left shoulder pain  Rationale for Evaluation and Treatment: Rehabilitation  SUBJECTIVE:   SUBJECTIVE STATEMENT:  He has been using his L hand more.   Pt accompanied by: Joseph Ortega  PERTINENT HISTORY:  [age] y.o. right-handed male with history of hypertension as well as diabetes mellitus, CVA Dec 2022 with left-sided residual weakness maintained on low-dose aspirin.  Per chart review lives with wife.  1 level home with a ramped entrance.  Wife is reportedly limited mobility.  Presented 05/02/2022 with headache and nonspecific chest pain.  Cranial CT scan showed acute hemorrhagic infarction affecting the right posterior parietal lobe.   PRECAUTIONS: Fall and Other: loop recorder, no driving  WEIGHT BEARING RESTRICTIONS: No  PAIN:  Are you having pain? No and medication  FALLS: Has patient fallen in last 6 months? Yes though unable to confirm number of falls  LIVING ENVIRONMENT: Lives with: lives with their spouse Pt lives in 1 story home with ramped entrance Has following equipment at home: Quad cane large base, Environmental consultant - 2 wheeled, Environmental consultant - 4 wheeled, Wheelchair (manual), bed side commode, Grab bars,  Ramped entry, and rollator, built in shower seat  PLOF: Independent and prior to Dec 2022, working on getting long term disability, likes to cook  PATIENT GOALS: get my Lt arm better, use a cane instead of rollator  OBJECTIVE:   HAND DOMINANCE: Right  ADLs: Eating: mod I but needs assist to cut food Grooming: mod I UB Dressing: mod I  LB Dressing: mod I  Toileting: mod I  Bathing: min assist for washing back and rinsing  (seated)  Tub Shower transfers: mod I w/ walk in shower, 2 grab bars Equipment:  see above  IADLs: Shopping: dependent (wife or grandson doing)  Light housekeeping: pt does laundry but needs assist to fold Meal Prep: dependent (was max assist prior to new stroke)  Community mobility: relies on family for transportation Medication management: wife assist (pt was independent prior to new stroke)  Financial management: same as meds management Handwriting:  denies change  MOBILITY STATUS:  rollator in community, RW in house   UPPER EXTREMITY ROM:  RUE AROM WNL's.  LUE: Dominated by synergy pattern w/ min shoulder movement going into abduction w/ no distal control. However distally (isolated), pt has supination to neutral, 75% wrist ext, 75% full composite flex, and 90% full composite ext.    HAND FUNCTION: Grip strength: Right: 88.6 lbs; Left: 23.5 lbs 08/01/2022: no change  COORDINATION: Pt could pick up block from lower surface w/ gross grasp, but unable to do Box & Blocks d/t limited shoulder movement and distal control  SENSATION: WFL per pt report  EDEMA: none during eval, minimal at times per pt report  MUSCLE TONE: LUE: Hypertonic  COGNITION: Overall cognitive status:  memory changes since first stroke in Dec 2022, worse since new stroke  VISION: Subjective report: I lost my peripheral vision on my Lt side with this new stroke (pt had Lt lower quadrant loss from first stroke)  Baseline vision: Wears glasses for reading only Visual history: retinopathy and corrective eye surgery  VISION ASSESSMENT: Ocular ROM: WFL Tracking/Visual pursuits: Able to track stimulus in all quads without difficulty Convergence: WFL Visual Fields: Left visual field deficits   PERCEPTION: Not tested  PRAXIS: Not tested  OBSERVATIONS: new Lt visual field loss, slightly weaker LUE however presents the same as eval prior to new stroke   TODAY'S TREATMENT:             - Joseph Ortega-care/home  management completed for duration as noted below including:  Objective measures assessed as noted in Goals section to determine progression towards goals. Therapist reviewed goals with patient and updated patient progression.  No additional functional limitations identified.  Letter and circle cancellations with education for additional scanning practice                                                                                                                 PATIENT EDUCATION: Education details: L thumb ROM and Oval 8 splint for thumb Person educated: Patient Education method: Explanation, Demonstration, Tactile cues, and Handouts Education comprehension: verbalized  understanding, returned demonstration, and needs further education  HOME EXERCISE PROGRAM: 04/17/22: Review of previously issued HEP (prior to new stroke - however still appropriate)  07/24/22: LUE HEP  08/01/2022: L red putty HEP 09/13/22 Access Code: [email or web address] - Thumb ROM Prepared by: Amada Kingfisher   GOALS: Goals reviewed with patient? Yes  LONG TERM GOALS: Target date: 10/19/22  Pt will be independent with updated HEP for LUE Baseline:  Goal status: IN PROGRESS  2.  Pt will improve LUE function as being able to perform 5 blocks on Box & Blocks test Baseline: unable 08/30/2022 - 5 blocks Goal status: REVISED 09/14/22 - 10 blocks in 1 minute  3.  Pt will improve UEFS to 46% or higher Baseline: 43% on previous eval in Jan 2024 09/27/2022: 77.5% Goal status: MET  4.  Pt will be able to prepare simple meal with direct supervision Baseline:  Goal status: MET  5.  Pt will demo 30* sh flexion LUE for low level reaching Baseline: no true sh flex, approx 60* abduction w/ no distal control Goal status: MET  6.  Pt will perform tabletop and environmental scanning safely with at least 75% accuracy Baseline:  09/27/2022: 85% accuracy with letter cancellation Goal status: MET  ASSESSMENT:  CLINICAL IMPRESSION: Pt  progressing well towards goals though does not appear to have met max rehab potential. Will update goals during next session and adjust frequency as needed.   PERFORMANCE DEFICITS: in functional skills including ADLs, IADLs, coordination, dexterity, tone, ROM, pain, Fine motor control, Gross motor control, mobility, balance, body mechanics, decreased knowledge of use of DME, and UE functional use, cognitive skills including memory, and psychosocial skills including coping strategies.   IMPAIRMENTS: are limiting patient from ADLs, IADLs, leisure, and social participation.   CO-MORBIDITIES: may have co-morbidities  that affects occupational performance. Patient will benefit from skilled OT to address above impairments and improve overall function.  REHAB POTENTIAL: Good  PLAN:  OT FREQUENCY: 2x/week  OT DURATION: additional 8 weeks   PLANNED INTERVENTIONS: Joseph Ortega care/ADL training, therapeutic exercise, therapeutic activity, neuromuscular re-education, manual therapy, passive range of motion, functional mobility training, electrical stimulation, moist heat, patient/family education, cognitive remediation/compensation, visual/perceptual remediation/compensation, energy conservation, coping strategies training, and DME and/or AE instructions  RECOMMENDED OTHER SERVICES: none at this time  CONSULTED AND AGREED WITH PLAN OF CARE: Patient  PLAN FOR NEXT SESSION:   Check how ROM and use of Oval 8 splint for L thumb are going.  Continue to review/update putty HEP (if he brings) and progress AROM/coordination of L hand for functional activities  Any AE to ensure max ease/safety with ADLs/IADLS  UPDATE GOALS (including Box and Blocks) add new goals and recert as needed.    Delana Meyer, OT 09/27/2022, 4:47 PM

## 2022-09-27 ENCOUNTER — Ambulatory Visit: Payer: Medicare HMO | Attending: Physical Medicine & Rehabilitation | Admitting: Physical Therapy

## 2022-09-27 ENCOUNTER — Ambulatory Visit: Payer: Medicare HMO | Admitting: Occupational Therapy

## 2022-09-27 VITALS — BP 149/69 | HR 64

## 2022-09-27 DIAGNOSIS — I69354 Hemiplegia and hemiparesis following cerebral infarction affecting left non-dominant side: Secondary | ICD-10-CM

## 2022-09-27 DIAGNOSIS — R278 Other lack of coordination: Secondary | ICD-10-CM | POA: Diagnosis present

## 2022-09-27 DIAGNOSIS — G8929 Other chronic pain: Secondary | ICD-10-CM | POA: Insufficient documentation

## 2022-09-27 DIAGNOSIS — R2689 Other abnormalities of gait and mobility: Secondary | ICD-10-CM | POA: Diagnosis present

## 2022-09-27 DIAGNOSIS — M25512 Pain in left shoulder: Secondary | ICD-10-CM | POA: Diagnosis present

## 2022-09-27 DIAGNOSIS — R2681 Unsteadiness on feet: Secondary | ICD-10-CM | POA: Diagnosis present

## 2022-09-27 DIAGNOSIS — M6281 Muscle weakness (generalized): Secondary | ICD-10-CM | POA: Diagnosis present

## 2022-09-27 NOTE — Therapy (Signed)
OUTPATIENT PHYSICAL THERAPY NEURO TREATMENT   Patient Name: Joseph Ortega MRN: [id number] DOB:[birth year], 67 y.o., male Today's Date: 09/27/2022   PCP: [facility] (looking for new PCP) REFERRING PROVIDER: Ranelle Oyster, MD   END OF SESSION:  PT End of Session - 09/27/22 1403     Visit Number 5    Number of Visits 13    Date for PT Re-Evaluation 10/18/22    Authorization Type [employer]    Progress Note Due on Visit 10    PT Start Time 1400    PT Stop Time 1445    PT Time Calculation (min) 45 min    Equipment Utilized During Treatment Gait belt    Activity Tolerance Patient tolerated treatment well    Behavior During Therapy WFL for tasks assessed/performed                Past Medical History:  Diagnosis Date   Diabetes mellitus without complication (HCC)    Hypertension    Thyroid disease    Past Surgical History:  Procedure Laterality Date   HERNIA REPAIR     IR ANGIO INTRA EXTRACRAN SEL COM CAROTID INNOMINATE BILAT MOD SED  03/09/2021   IR ANGIO VERTEBRAL SEL VERTEBRAL BILAT MOD SED  03/09/2021   IR US GUIDE VASC ACCESS RIGHT  03/09/2021   LOOP RECORDER INSERTION N/A 05/07/2022   Procedure: LOOP RECORDER INSERTION;  Surgeon: Lanier Prude, MD;  Location: [facility];  Service: Cardiovascular;  Laterality: N/A;   Patient Active Problem List   Diagnosis Date Noted   Cervicalgia 08/15/2022   Hyperlipidemia 05/07/2022   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   Stroke (cerebrum) (HCC) 05/02/2022   ICH (intracerebral hemorrhage) (HCC) 03/17/2021   Hypothyroidism 03/12/2021   OSA (obstructive sleep apnea) 03/12/2021   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   Uncontrolled type 2 diabetes mellitus with hyperglycemia (HCC) 03/12/2021   Redness of both eyes 03/12/2021   Morbid obesity (HCC) 03/11/2021   Essential hypertension 03/11/2021   Tobacco abuse 03/11/2021   Herpes zoster with complication    SAH (subarachnoid  hemorrhage) (HCC) 03/08/2021    ONSET DATE:   07/20/2022  referral  REFERRING DIAG: N82.956 (ICD-10-CM) - Cerebral infarction due to occlusion of right middle cerebral artery (HCC)   THERAPY DIAG:  Muscle weakness (generalized)  Hemiplegia and hemiparesis following cerebral infarction affecting left non-dominant side (HCC)  Unsteadiness on feet  Other abnormalities of gait and mobility  Other lack of coordination  Rationale for Evaluation and Treatment: Rehabilitation  SUBJECTIVE:  SUBJECTIVE STATEMENT: Pt reports no falls and no pain today. No acute changes since last visit. Pt has not spoken with Dr. Riley Kill about results of cervical MRI. Assisted pt with setting up MyChart so that he can message his providers.  Pt reports he had his loop recorder checked but says he will be notified if there were any issues.  Pt accompanied by: self  PERTINENT HISTORY: DM, HTN, Thyroid disease and prior CVA 12/22 with residual L side weakness. Admitted on 05/02/22 due to HA, CP, and found to have hemorrhage in the right temporoparietal region with 2mm L to R midline shift.   PAIN:  Are you having pain? No  VITALS Vitals:   09/27/22 1415  BP: (!) 149/69  Pulse: 64     PRECAUTIONS: Fall  PATIENT GOALS: "get to walking on a cane and get this arm working as much as I can"  OBJECTIVE:   DIAGNOSTIC FINDINGS:   Cervical Spine MRI scheduled for 09/02/22  Cervical Spine Xray 08/16/22 FINDINGS: There is no evidence of cervical spine fracture or prevertebral soft tissue swelling. Alignment is normal. Mild intervertebral disc space narrowing and degenerative endplate changes are seen at multiple levels and most pronounced at C5-C6. There is neural foraminal narrowing at C3-C4, C4-C5, and C5-C6 on the  right and C3-C4 C4-C5 on the left.   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   MRI of brain on 05/02/22   IMPRESSION: 1. Evaluation is limited by motion artifact. In addition, the MRI head could not be completed in its entirety. 2. Redemonstrated area of infarction with hemorrhage in the right temporoparietal region, which appears overall similar to the same day CT, when allowing for differences in technique. Edema causes mass effect on the right lateral ventricle and approximately 2 mm right-to-left midline shift. 3. No intracranial large vessel occlusion. Suspect severe stenosis in the left P2, although evaluation is limited by motion. 4. No hemodynamically significant stenosis in the neck.   TODAY'S TREATMENT:                  TherAct Seated BP assessed at beginning of therapy session, see above. BP within safe limits to participate in therapy session this date.   For LTG assessment:  [facility] PT Assessment - 09/27/22 1426       Ambulation/Gait   Gait velocity 32.8 ft over 22.3 = 1.47 ft/sec      Standardized Balance Assessment   Standardized Balance Assessment Timed Up and Go Test;Five Times Sit to Stand    Five times sit to stand comments  12.1 sec   RUE support on mat table     Berg Balance Test   Sit to Stand Able to stand  independently using hands    Standing Unsupported Able to stand 2 minutes with supervision    Sitting with Back Unsupported but Feet Supported on Floor or Stool Able to sit safely and securely 2 minutes    Stand to Sit Controls descent by using hands    Transfers Able to transfer safely, definite need of hands    Standing Unsupported with Eyes Closed Able to stand 10 seconds with supervision    Standing Unsupported with Feet Together Able to place feet together independently and stand for 1 minute with supervision    From Standing, Reach Forward with Outstretched Arm Reaches forward but needs supervision     From Standing Position, Pick up Object from Floor Unable to try/needs assist to keep balance  From Standing Position, Turn to Look Behind Over each Shoulder Needs supervision when turning    Turn 360 Degrees Needs assistance while turning    Standing Unsupported, Alternately Place Feet on Step/Stool Needs assistance to keep from falling or unable to try    Standing Unsupported, One Foot in Front Needs help to step but can hold 15 seconds    Standing on One Leg Able to lift leg independently and hold equal to or more than 3 seconds    Total Score 27    Berg comment: 27, high fall risk      Timed Up and Go Test   TUG Normal TUG    Normal TUG (seconds) 29.7   with rollator            Gait Gait pattern: decreased hip/knee flexion- Left Distance walked: 115 ft Assistive device utilized: Walker - 4 wheeled Level of assistance: Modified independence Comments: with custom L AFO  Gait pattern: decreased hip/knee flexion- Left and decreased ankle dorsiflexion- Left Distance walked: 115 ft Assistive device utilized: Walker - 4 wheeled Level of assistance: Modified independence Comments: without L AFO                                                               PATIENT EDUCATION: Education details: continue HEP, results of OM and functional implications, assessment of gait with and without AFO Person educated: Patient Education method: Explanation Education comprehension: verbalized understanding and needs further education  HOME EXERCISE PROGRAM: To be reviewed from previous bout of therapy  Access Code: [email or web address] URL: [email or web address] Date: 04/03/2022 Prepared by: Peter Congo   Exercises - Sit to Stand Without Arm Support  - 1 x daily - 7 x weekly - 3 sets - 10 reps - Mini Squat with Counter Support  - 1 x daily - 7 x weekly - 3 sets - 10 reps - Standing Hip Extension with Unilateral Counter Support  - 1 x daily - 7 x weekly - 3 sets - 10 reps -  Standing Hip Abduction with Unilateral Counter Support  - 1 x daily - 7 x weekly - 3 sets - 10 reps - Seated Ankle Alphabet  - 1 x daily - 7 x weekly - 1 sets - 1 reps  GOALS:  Goals reviewed with patient? Yes   SHORT TERM GOALS: Target date: 08/24/22   1. Pt will be independent with independent HEP for improved strength, balance, transfers and gait.  Baseline: to be reviewed from prior bout of therapy  Goal status: MET   2.  Pt will improve gait velocity to at least .57m/s with LRAD for improved gait efficiency   Baseline: .79m/s , 0.47 ft/sec with rollator (6/13) Goal status: MET   3.  Pt will improve 5 x STS to less than or equal to 15 seconds w/RUE support and good form to demonstrate improved functional strength and transfer efficiency.   Baseline:  20.97s with R UE, 17.38 sec with RUE (6/13)  Goal status: IN PROGRESS  4.  Pt will improve normal TUG to less than or equal to 27 seconds w/LRAD for improved functional mobility and decreased fall risk.   Baseline: 33.18s with rollator, 32 sec with rollator (6/13)  Goal status: IN PROGRESS  5.  Patient will  improve BBS score to >/= 27/56 to demonstrate improved balance  Baseline: 20/56, 27/56 (6/13)  Goal status: MET   LONG TERM GOALS: Target date: 10/18/22 (updated to match last scheduled appt date within cert)   1. Pt will be independent with final HEP for improved strength, balance, transfers and gait.   Baseline:  Goal status: INITIAL   2.  Pt will improve gait velocity to at least .58m/s with LRAD for improved gait efficiency   Baseline: .48m/s, 0.47 m/s with rollator (6/13), 0.45 m/s with rollator (7/11) Goal status: IN PROGRESS  3.  Pt will improve 5 x STS to less than or equal to 12 seconds w/RUE support and proper form to demonstrate improved functional strength and transfer efficiency.   Baseline: 20.97s with R UE,  17.38 sec with RUE (6/13), 12.1 sec with RUE (7/11)  Goal status: IN PROGRESS  4.  Patient will  improve BBS score to >/=36/56 to demonstrate improved balance  Baseline: 20/56, 27/56 (6/13) , 27/56 (7/11)  Goal status: IN PROGRESS  5.  Pt will improve normal TUG to less than or equal to 23 seconds w/LRAD for improved functional mobility and decreased fall risk.   Baseline: 33.18s with rollator, 32 sec with rollator (6/13), 29.7 sec with rollator (7/11)   Goal status: IN PROGRESS   ASSESSMENT:   CLINICAL IMPRESSION:  Emphasis of skilled PT session on assessing LTG due to goals being due this date as well as updating LTG due date to match last scheduled appt within this certification. Pt has missed several visits during this certification period due to ongoing medical issues including hypertension. Pt's vitals within safe limits for participation. Pt has made progress towards LTGs demonstrating improved functional LE strength and improved balance/decreased fall risk. Pt also exhibits improved gait mechanics with LLE with use of his AFO vs gait without his AFO. Pt particularly benefits from using his AFO when he gets fatigued and in distracting environment when he is not able to fully attend to this limb. Pt continues to benefit from skilled therapy services to work towards strengthening his L hemibody and improve his overall balance and endurance. Continue POC.    OBJECTIVE IMPAIRMENTS: Abnormal gait, decreased activity tolerance, decreased balance, decreased coordination, decreased endurance, decreased knowledge of use of DME, difficulty walking, decreased strength, decreased safety awareness, and impaired UE functional use   ACTIVITY LIMITATIONS: carrying, lifting, bending, standing, squatting, stairs, bed mobility, reach over head, locomotion level, and caring for others   PARTICIPATION LIMITATIONS: meal prep, cleaning, laundry, medication management, interpersonal relationship, driving, shopping, community activity, occupation, and yard work   PERSONAL FACTORS: Fitness, Past/current  experiences, Time since onset of injury/illness/exacerbation, Transportation, and 1-2 comorbidities: HTN and multiple CVA  are also affecting patient's functional outcome.   REHAB POTENTIAL: Fair due to multiple CVAs and lack of support at home   CLINICAL DECISION MAKING: Evolving/moderate complexity   EVALUATION COMPLEXITY: Moderate   PLAN:   PT FREQUENCY: 1x/week per patient request   PT DURATION: 12 weeks   PLANNED INTERVENTIONS: Therapeutic exercises, Therapeutic activity, Neuromuscular re-education, Balance training, Gait training, Patient/Family education, Self Care, Joint mobilization, Stair training, Orthotic/Fit training, DME instructions, Aquatic Therapy, Dry Needling, Manual therapy, and Re-evaluation   PLAN FOR NEXT SESSION: Review previous HEP and update to ensure it is safe. L NMR and global strength, floor transfers, endurance    Peter Congo, PT, DPT, CSRS  09/27/2022, 2:47 PM

## 2022-10-02 NOTE — Progress Notes (Signed)
 [facility] Summary Report / Loop Recorder 

## 2022-10-04 ENCOUNTER — Ambulatory Visit: Payer: Medicare HMO | Admitting: Physical Therapy

## 2022-10-04 ENCOUNTER — Ambulatory Visit: Payer: Medicare HMO | Admitting: Occupational Therapy

## 2022-10-04 VITALS — BP 167/74 | HR 62

## 2022-10-04 DIAGNOSIS — R2681 Unsteadiness on feet: Secondary | ICD-10-CM

## 2022-10-04 DIAGNOSIS — M6281 Muscle weakness (generalized): Secondary | ICD-10-CM

## 2022-10-04 DIAGNOSIS — G8929 Other chronic pain: Secondary | ICD-10-CM

## 2022-10-04 DIAGNOSIS — R2689 Other abnormalities of gait and mobility: Secondary | ICD-10-CM

## 2022-10-04 DIAGNOSIS — R278 Other lack of coordination: Secondary | ICD-10-CM

## 2022-10-04 DIAGNOSIS — I69354 Hemiplegia and hemiparesis following cerebral infarction affecting left non-dominant side: Secondary | ICD-10-CM

## 2022-10-04 NOTE — Therapy (Signed)
OUTPATIENT OCCUPATIONAL THERAPY NEURO TREATMENT & PROGRESS NOTE  Patient Name: Amin Fornwalt MRN: [id number] DOB:09-02-1955, 67 y.o., male Today's Date: 10/04/2022  PCP: Dr. Ross Ludwig REFERRING PROVIDER: Charlton Amor, PA-C  END OF SESSION:  OT End of Session - 10/04/22 1204     Visit Number 9    Number of Visits 18    Date for OT Re-Evaluation 12/14/22    Authorization Type [employer] - requires auth    Progress Note Due on Visit 18    OT Start Time 1230    OT Stop Time 1315    OT Time Calculation (min) 45 min    Equipment Utilized During Treatment testing material    Activity Tolerance Patient tolerated treatment well    Behavior During Therapy WFL for tasks assessed/performed              Past Medical History:  Diagnosis Date   Diabetes mellitus without complication (HCC)    Hypertension    Thyroid disease    Past Surgical History:  Procedure Laterality Date   HERNIA REPAIR     IR ANGIO INTRA EXTRACRAN SEL COM CAROTID INNOMINATE BILAT MOD SED  03/09/2021   IR ANGIO VERTEBRAL SEL VERTEBRAL BILAT MOD SED  03/09/2021   IR US GUIDE VASC ACCESS RIGHT  03/09/2021   LOOP RECORDER INSERTION N/A 05/07/2022   Procedure: LOOP RECORDER INSERTION;  Surgeon: Lanier Prude, MD;  Location: [facility];  Service: Cardiovascular;  Laterality: N/A;   Patient Active Problem List   Diagnosis Date Noted   Cervicalgia 08/15/2022   Hyperlipidemia 05/07/2022   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   Stroke (cerebrum) (HCC) 05/02/2022   ICH (intracerebral hemorrhage) (HCC) 03/17/2021   Hypothyroidism 03/12/2021   OSA (obstructive sleep apnea) 03/12/2021   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   Uncontrolled type 2 diabetes mellitus with hyperglycemia (HCC) 03/12/2021   Redness of both eyes 03/12/2021   Morbid obesity (HCC) 03/11/2021   Essential hypertension 03/11/2021   Tobacco abuse 03/11/2021   Herpes zoster with complication    SAH  (subarachnoid hemorrhage) (HCC) 03/08/2021    ONSET DATE: 05/23/2022 (referral date)   REFERRING DIAG:  I63.511 (ICD-10-CM) - Cerebral infarction due to unspecified occlusion or stenosis of right middle cerebral artery  *New stroke 05/02/22 w/ more weakness LUE  THERAPY DIAG:  Muscle weakness (generalized)  Other lack of coordination  Hemiplegia and hemiparesis following cerebral infarction affecting left non-dominant side (HCC)  Chronic left shoulder pain  Unsteadiness on feet  Rationale for Evaluation and Treatment: Rehabilitation  SUBJECTIVE:   SUBJECTIVE STATEMENT:  He has been continuing to work on using his L hand more. In agreement with continuing OT services 1x/week.  He has been seeing lots of ads for things like a Bionic arm for people with strokes and wanted more information. He also noted that he misplaced the Oval 8 splint for his L thumb.  Pt accompanied by: self  PERTINENT HISTORY:  67 y.o. right-handed male with history of hypertension as well as diabetes mellitus, CVA Dec 2022 with left-sided residual weakness maintained on low-dose aspirin.  Per chart review lives with wife.  1 level home with a ramped entrance.  Wife is reportedly limited mobility.  Presented 05/02/2022 with headache and nonspecific chest pain.  Cranial CT scan showed acute hemorrhagic infarction affecting the right posterior parietal lobe.   PRECAUTIONS: Fall and Other: loop recorder, no driving  WEIGHT BEARING RESTRICTIONS: No  PAIN:  Are you having pain?  Yes: NPRS scale: 2/10 Pain location: shoulder/neck Pain description: aching Aggravating factors: nothing specific Relieving factors: Scheduled pain medication    FALLS: Has patient fallen in last 6 months? Yes though unable to confirm number of falls  LIVING ENVIRONMENT: Lives with: lives with their spouse Pt lives in 1 story home with ramped entrance Has following equipment at home: Quad cane large base, Environmental consultant - 2 wheeled, Environmental consultant  - 4 wheeled, Wheelchair (manual), bed side commode, Grab bars, Ramped entry, and rollator, built in shower seat  PLOF: Independent and prior to Dec 2022, working on getting long term disability, likes to [employer]  PATIENT GOALS: get my Lt arm better, use a cane instead of rollator  OBJECTIVE:   HAND DOMINANCE: Right  ADLs: Eating: mod I but needs assist to cut food Grooming: mod I UB Dressing: mod I  LB Dressing: mod I  Toileting: mod I  Bathing: min assist for washing back and rinsing (seated)  Tub Shower transfers: mod I w/ walk in shower, 2 grab bars Equipment:  see above  IADLs: Shopping: dependent (wife or grandson doing)  Light housekeeping: pt does laundry but needs assist to fold Meal Prep: dependent (was max assist prior to new stroke)  Community mobility: relies on family for transportation Medication management: wife assist (pt was independent prior to new stroke)  Financial management: same as meds management Handwriting:  denies change  MOBILITY STATUS:  rollator in community, RW in house   UPPER EXTREMITY ROM:  RUE AROM WNL's.  LUE: Dominated by synergy pattern w/ min shoulder movement going into abduction w/ no distal control. However distally (isolated), pt has supination to neutral, 75% wrist ext, 75% full composite flex, and 90% full composite ext.    HAND FUNCTION: Grip strength: Right: 88.6 lbs; Left: 23.5 lbs 08/01/2022: no change 10/04/22 Left 29.6, 37.4, 29.3, Avg: 32.1 lbs  COORDINATION: Pt could pick up block from lower surface w/ gross grasp, but unable to do Box & Blocks d/t limited shoulder movement and distal control  SENSATION: WFL per pt report  EDEMA: none during eval, minimal at times per pt report  MUSCLE TONE: LUE: Hypertonic  COGNITION: Overall cognitive status:  memory changes since first stroke in Dec 2022, worse since new stroke  VISION: Subjective report: I lost my peripheral vision on my Lt side with this new stroke (pt had Lt  lower quadrant loss from first stroke)  Baseline vision: Wears glasses for reading only Visual history: retinopathy and corrective eye surgery  VISION ASSESSMENT: Ocular ROM: WFL Tracking/Visual pursuits: Able to track stimulus in all quads without difficulty Convergence: WFL Visual Fields: Left visual field deficits   PERCEPTION: Not tested  PRAXIS: Not tested  OBSERVATIONS: new Lt visual field loss, slightly weaker LUE however presents the same as eval prior to new stroke   TODAY'S TREATMENT:             - Therapeutic Activities   Left Grip Strength retested x 3 trials 29.6, 37.4, 29.3, Avg: 32.1 lbs  Left UE Box and Blocks test x 6 in 1 minute via standardized process moving blocks form L side over center divider to R side.  He did pick up multiple blocks x 4 of 6 trials though and is given credit for only 1 block each time  He was able to also demonstrate picking up 17 blocks placed individually on table top to place them in the box (without center divider) in 1 minute.   Also practiced picking up  blocks with new Oval 8 splint on L hand to improve pincer grasp as MP flexes on L thumb often with grasping activities.   Pt also viewed Myomo video for future considerations of options for further progression of LUE coordination/use.  - Self-care  Patient engaged in grasp and release of empty can to simulate option for self feeding ie) holding small cups of pudding etc.  He is able to hold the container with some slight pronation at times and cues to correct upright grasp (ie with forearm in neutral).  He was able to turn the can slightly to effectively scooped dry beans via several scoops with spoon in R hand.  He is shown some small child size plastic cups that he could use to hold food for self feeding ie) ice cream etc.     PATIENT EDUCATION: Education details: L thumb ROM and Oval 8 splint for thumb Person educated: Patient Education method: Explanation, Demonstration,  Tactile cues, and Handouts Education comprehension: verbalized understanding, returned demonstration, and needs further education  HOME EXERCISE PROGRAM: 04/17/22: Review of previously issued HEP (prior to new stroke - however still appropriate)  07/24/22: LUE HEP  08/01/2022: L red putty HEP 08/30/2022: coordination list provided 09/13/22 Access Code: [email or web address] - Thumb ROM Prepared by: Amada Kingfisher   GOALS: Goals reviewed with patient? Yes  LONG TERM GOALS: Target date: 10/19/22  Pt will be independent with updated HEP for LUE coordination and ROM for shoulder/elbow. Baseline: 10/04/22 - putty, thumb ROM and coordination activities provided via handouts Goal status: REVISED  2.  Pt will improve LUE function as being able to perform 5 blocks on Box & Blocks test Baseline: unable 08/30/2022 - 5 blocks Goal status: REVISED 09/14/22 - Goal: 10 blocks in 1 minute 10/04/22 - 6 blocks  3.  Pt will improve UEFS to 46% or higher Baseline: 43% on previous eval in Jan 2024 09/27/2022: 77.5% Goal status: MET  4.  Pt will be able to prepare simple meal with direct supervision Baseline:  Goal status: MET  5.  Pt will demo 30* sh flexion LUE for low level reaching Baseline: no true sh flex, approx 60* abduction w/ no distal control Goal status: MET  6.  Pt will perform tabletop and environmental scanning safely with at least 75% accuracy Baseline:  09/27/2022: 85% accuracy with letter cancellation Goal status: MET  ASSESSMENT:  CLINICAL IMPRESSION:  This 9th progress note is for dates: 06/04/22 to 10/04/2022. Pt has met 4/5 LTGs and has continued to show progression in L UE AROM and coordination but is still significantly impaired with ROM < 50% of available range which makes table top activities difficult.  He is making progress towards goals is expected to continue to progress, therefore will continue to benefit from skilled OT services in the outpatient setting to work towards remaining goals or  until max rehab potential is met.      PERFORMANCE DEFICITS: in functional skills including ADLs, IADLs, coordination, dexterity, tone, ROM, pain, Fine motor control, Gross motor control, mobility, balance, body mechanics, decreased knowledge of use of DME, and UE functional use, cognitive skills including memory, and psychosocial skills including coping strategies.   IMPAIRMENTS: are limiting patient from ADLs, IADLs, leisure, and social participation.   CO-MORBIDITIES: may have co-morbidities  that affects occupational performance. Patient will benefit from skilled OT to address above impairments and improve overall function.  REHAB POTENTIAL: Good  PLAN:  OT FREQUENCY: 1x/week  OT DURATION: additional 8 weeks   PLANNED  INTERVENTIONS: self care/ADL training, therapeutic exercise, therapeutic activity, neuromuscular re-education, manual therapy, passive range of motion, functional mobility training, electrical stimulation, moist heat, patient/family education, cognitive remediation/compensation, visual/perceptual remediation/compensation, energy conservation, coping strategies training, and DME and/or AE instructions  RECOMMENDED OTHER SERVICES: none at this time  CONSULTED AND AGREED WITH PLAN OF CARE: Patient  PLAN FOR NEXT SESSION:   MYOMO considerations & Add new goals as appropriate   Continue to progress AROM/coordination of L hand for functional activities  Any AE to ensure max ease/safety with ADLs/IADLS    Victorino Sparrow, OT 1/61/0960, 2:31 PM

## 2022-10-04 NOTE — Therapy (Signed)
OUTPATIENT PHYSICAL THERAPY NEURO TREATMENT   Patient Name: Joseph Ortega MRN: [id number] DOB:[birth year], 67 y.o., male Today's Date: 10/04/2022   PCP: [facility] (looking for new PCP) REFERRING PROVIDER: Ranelle Oyster, MD   END OF SESSION:  PT End of Session - 10/04/22 1319     Visit Number 6    Number of Visits 13    Date for PT Re-Evaluation 10/18/22    Authorization Type [employer]    Progress Note Due on Visit 10    PT Start Time 1317   from OT session   PT Stop Time 1400    PT Time Calculation (min) 43 min    Equipment Utilized During Treatment Gait belt    Activity Tolerance Patient tolerated treatment well    Behavior During Therapy WFL for tasks assessed/performed                 Past Medical History:  Diagnosis Date   Diabetes mellitus without complication (HCC)    Hypertension    Thyroid disease    Past Surgical History:  Procedure Laterality Date   HERNIA REPAIR     IR ANGIO INTRA EXTRACRAN SEL COM CAROTID INNOMINATE BILAT MOD SED  03/09/2021   IR ANGIO VERTEBRAL SEL VERTEBRAL BILAT MOD SED  03/09/2021   IR US GUIDE VASC ACCESS RIGHT  03/09/2021   LOOP RECORDER INSERTION N/A 05/07/2022   Procedure: LOOP RECORDER INSERTION;  Surgeon: Lanier Prude, MD;  Location: [facility];  Service: Cardiovascular;  Laterality: N/A;   Patient Active Problem List   Diagnosis Date Noted   Cervicalgia 08/15/2022   Hyperlipidemia 05/07/2022   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   Stroke (cerebrum) (HCC) 05/02/2022   ICH (intracerebral hemorrhage) (HCC) 03/17/2021   Hypothyroidism 03/12/2021   OSA (obstructive sleep apnea) 03/12/2021   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   Uncontrolled type 2 diabetes mellitus with hyperglycemia (HCC) 03/12/2021   Redness of both eyes 03/12/2021   Morbid obesity (HCC) 03/11/2021   Essential hypertension 03/11/2021   Tobacco abuse 03/11/2021   Herpes zoster with complication    SAH  (subarachnoid hemorrhage) (HCC) 03/08/2021    ONSET DATE:   07/20/2022  referral  REFERRING DIAG: W09.811 (ICD-10-CM) - Cerebral infarction due to occlusion of right middle cerebral artery (HCC)   THERAPY DIAG:  Muscle weakness (generalized)  Hemiplegia and hemiparesis following cerebral infarction affecting left non-dominant side (HCC)  Other lack of coordination  Unsteadiness on feet  Other abnormalities of gait and mobility  Rationale for Evaluation and Treatment: Rehabilitation  SUBJECTIVE:  SUBJECTIVE STATEMENT: Pt reports no acute changes since last visit. No falls. Pt reports ongoing pain in his neck. Pt reports new onset of difficulty sleeping over the past few days.  Pt accompanied by: self  PERTINENT HISTORY: DM, HTN, Thyroid disease and prior CVA 12/22 with residual L side weakness. Admitted on 05/02/22 due to HA, CP, and found to have hemorrhage in the right temporoparietal region with 2mm L to R midline shift.   PAIN:  Are you having pain? No  VITALS Vitals:   10/04/22 1323  BP: (!) 167/74  Pulse: 62      PRECAUTIONS: Fall  PATIENT GOALS: "get to walking on a cane and get this arm working as much as I can"  OBJECTIVE:   DIAGNOSTIC FINDINGS:   Cervical Spine MRI scheduled for 09/02/22  Cervical Spine Xray 08/16/22 FINDINGS: There is no evidence of cervical spine fracture or prevertebral soft tissue swelling. Alignment is normal. Mild intervertebral disc space narrowing and degenerative endplate changes are seen at multiple levels and most pronounced at C5-C6. There is neural foraminal narrowing at C3-C4, C4-C5, and C5-C6 on the right and C3-C4 C4-C5 on the left.   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   MRI of  brain on 05/02/22   IMPRESSION: 1. Evaluation is limited by motion artifact. In addition, the MRI head could not be completed in its entirety. 2. Redemonstrated area of infarction with hemorrhage in the right temporoparietal region, which appears overall similar to the same day CT, when allowing for differences in technique. Edema causes mass effect on the right lateral ventricle and approximately 2 mm right-to-left midline shift. 3. No intracranial large vessel occlusion. Suspect severe stenosis in the left P2, although evaluation is limited by motion. 4. No hemodynamically significant stenosis in the neck.   TODAY'S TREATMENT:                  TherAct Seated BP assessed at beginning of therapy session, see above. BP within safe limits to participate in therapy session this date though elevated.   TherEx Reviewed prior HEP: Sit to stands without UE support x 10 reps from slightly elevated surface  Mini-squats x 10 reps with cues for correct form  In // bars with BUE support: Hip abd 2 x 10 reps B Hip ext 2 x 10 reps B                                                               PATIENT EDUCATION: Education details: continue HEP (provided review this date and new handout) Person educated: Patient Education method: Explanation, Demonstration, Tactile cues, Verbal cues, and Handouts Education comprehension: verbalized understanding, returned demonstration, and needs further education  HOME EXERCISE PROGRAM: Access Code: [email or web address] URL: [email or web address] Date: 04/03/2022 Prepared by: Peter Congo   Exercises - Sit to Stand Without Arm Support  - 1 x daily - 7 x weekly - 3 sets - 10 reps - Mini Squat with Counter Support  - 1 x daily - 7 x weekly - 3 sets - 10 reps - Standing Hip Extension with Unilateral Counter Support  - 1 x daily - 7 x weekly - 3 sets - 10 reps - Standing Hip Abduction with Unilateral Counter Support  - 1 x daily -  7 x weekly - 3  sets - 10 reps   GOALS:  Goals reviewed with patient? Yes   SHORT TERM GOALS: Target date: 08/24/22   1. Pt will be independent with independent HEP for improved strength, balance, transfers and gait.  Baseline: to be reviewed from prior bout of therapy  Goal status: MET   2.  Pt will improve gait velocity to at least .28m/s with LRAD for improved gait efficiency   Baseline: .36m/s , 0.47 ft/sec with rollator (6/13) Goal status: MET   3.  Pt will improve 5 x STS to less than or equal to 15 seconds w/RUE support and good form to demonstrate improved functional strength and transfer efficiency.   Baseline:  20.97s with R UE, 17.38 sec with RUE (6/13)  Goal status: IN PROGRESS  4.  Pt will improve normal TUG to less than or equal to 27 seconds w/LRAD for improved functional mobility and decreased fall risk.   Baseline: 33.18s with rollator, 32 sec with rollator (6/13)  Goal status: IN PROGRESS  5.  Patient will improve BBS score to >/= 27/56 to demonstrate improved balance  Baseline: 20/56, 27/56 (6/13)  Goal status: MET   LONG TERM GOALS: Target date: 10/18/22 (updated to match last scheduled appt date within cert)   1. Pt will be independent with final HEP for improved strength, balance, transfers and gait.   Baseline:  Goal status: INITIAL   2.  Pt will improve gait velocity to at least .79m/s with LRAD for improved gait efficiency   Baseline: .70m/s, 0.47 m/s with rollator (6/13), 0.45 m/s with rollator (7/11) Goal status: IN PROGRESS  3.  Pt will improve 5 x STS to less than or equal to 12 seconds w/RUE support and proper form to demonstrate improved functional strength and transfer efficiency.   Baseline: 20.97s with R UE,  17.38 sec with RUE (6/13), 12.1 sec with RUE (7/11)  Goal status: IN PROGRESS  4.  Patient will improve BBS score to >/=36/56 to demonstrate improved balance  Baseline: 20/56, 27/56 (6/13) , 27/56 (7/11)  Goal status: IN PROGRESS  5.  Pt will  improve normal TUG to less than or equal to 23 seconds w/LRAD for improved functional mobility and decreased fall risk.   Baseline: 33.18s with rollator, 32 sec with rollator (6/13), 29.7 sec with rollator (7/11)   Goal status: IN PROGRESS   ASSESSMENT:   CLINICAL IMPRESSION:  Emphasis of skilled PT session on continuing to monitor vitals as well as reviewing HEP from previous POC and reassigning HEP. Pt is hypertensive this date but his BP is within safe limits for participation in session. Pt does well with HEP review with good ability to perform all exercises, provided pt with a new handout. Pt continues to benefit from skilled therapy services to work towards improving his L hemibody strength, endurance, and balance in order to increase his safety and independence with functional mobility and to decrease his fall risk. Continue POC.   OBJECTIVE IMPAIRMENTS: Abnormal gait, decreased activity tolerance, decreased balance, decreased coordination, decreased endurance, decreased knowledge of use of DME, difficulty walking, decreased strength, decreased safety awareness, and impaired UE functional use   ACTIVITY LIMITATIONS: carrying, lifting, bending, standing, squatting, stairs, bed mobility, reach over head, locomotion level, and caring for others   PARTICIPATION LIMITATIONS: meal prep, cleaning, laundry, medication management, interpersonal relationship, driving, shopping, community activity, occupation, and yard work   PERSONAL FACTORS: Fitness, Past/current experiences, Time since onset of injury/illness/exacerbation, Transportation, and  1-2 comorbidities: HTN and multiple CVA  are also affecting patient's functional outcome.   REHAB POTENTIAL: Fair due to multiple CVAs and lack of support at home   CLINICAL DECISION MAKING: Evolving/moderate complexity   EVALUATION COMPLEXITY: Moderate   PLAN:   PT FREQUENCY: 1x/week per patient request   PT DURATION: 12 weeks   PLANNED  INTERVENTIONS: Therapeutic exercises, Therapeutic activity, Neuromuscular re-education, Balance training, Gait training, Patient/Family education, Self Care, Joint mobilization, Stair training, Orthotic/Fit training, DME instructions, Aquatic Therapy, Dry Needling, Manual therapy, and Re-evaluation   PLAN FOR NEXT SESSION: how is HEP? L NMR and global strength, floor transfers, endurance; plan to recert 1x/week for 8 weeks at end of POC    Peter Congo, PT, DPT, CSRS  10/04/2022, 2:01 PM

## 2022-10-06 ENCOUNTER — Other Ambulatory Visit: Payer: Self-pay | Admitting: Physical Medicine & Rehabilitation

## 2022-10-08 DIAGNOSIS — Z0271 Encounter for disability determination: Secondary | ICD-10-CM

## 2022-10-10 ENCOUNTER — Encounter: Payer: Medicare HMO | Attending: Physical Medicine & Rehabilitation | Admitting: Physical Medicine & Rehabilitation

## 2022-10-10 ENCOUNTER — Encounter: Payer: Self-pay | Admitting: Physical Medicine & Rehabilitation

## 2022-10-10 VITALS — BP 160/74 | HR 64 | Ht 73.0 in | Wt 232.0 lb

## 2022-10-10 DIAGNOSIS — M4802 Spinal stenosis, cervical region: Secondary | ICD-10-CM | POA: Diagnosis present

## 2022-10-10 DIAGNOSIS — M542 Cervicalgia: Secondary | ICD-10-CM | POA: Insufficient documentation

## 2022-10-10 DIAGNOSIS — M5412 Radiculopathy, cervical region: Secondary | ICD-10-CM | POA: Diagnosis present

## 2022-10-10 MED ORDER — GABAPENTIN 300 MG PO CAPS: 300.0000 mg | ORAL_CAPSULE | Freq: Three times a day (TID) | ORAL | 4 refills | Status: DC

## 2022-10-10 NOTE — Progress Notes (Signed)
Subjective:    Patient ID: Joseph Ortega, male    DOB: September 27, 1955, 67 y.o.   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  I last saw him in May where he was having worsening neck and shoulder girdle pain..  We ordered an MRI which was done on 09/02/2022.  Findings were as follows:  1. At C4-5 there is a broad-based disc bulge with a broad central disc protrusion with mass effect on the ventral cervical spinal cord. Severe spinal stenosis. Bilateral uncovertebral degenerative changes. Severe bilateral foraminal stenosis. 2. Cervical spine spondylosis as described above. 3. No acute osseous injury of the cervical spine.  The pain he has is at the mid neck with radiation to the top of either shoulders.   Therapy really hasn't helped a great deal with his pain although balance and strength is better. GABAPENTIN, tramadol and tizanidine seem to be helping.   Pain Inventory Average Pain 2 Pain Right Now 2 My pain is intermittent, stabbing, and aching  In the last 24 hours, has pain interfered with the following? General activity 4 Relation with others 3 Enjoyment of life 2 What TIME of day is your pain at its worst? morning , daytime, evening, night, and varies Sleep (in general) Fair  Pain is worse with: walking, bending, and some activites Pain improves with: medication Relief from Meds: 7  Family History  Problem Relation Age of Onset   Diabetes Father    Social History   Socioeconomic History   Marital status: Married    Spouse name: Not on file   Number of children: Not on file   Years of education: Not on file   Highest education level: Not on file  Occupational History   Not on file  Tobacco Use   Smoking status: Never   Smokeless tobacco: Never  Vaping Use   Vaping status: Never Used  Substance and Sexual Activity   Alcohol use: No   Drug use: No   Sexual activity: Not on file  Other Topics Concern   Not on file   Social History Narrative   Not on file   Social Determinants of Health   Financial Resource Strain: Not on file  Food Insecurity: No Food Insecurity (05/04/2022)   Hunger Vital Sign    Worried About Running Out of Food in the Last Year: Never true    Ran Out of Food in the Last Year: Never true  Transportation Needs: No Transportation Needs (05/04/2022)   PRAPARE - Administrator, Civil Service (Medical): No    Lack of Transportation (Non-Medical): No  Physical Activity: Not on file  Stress: Not on file  Social Connections: Unknown (08/01/2021)   Received from [facility]   Social Network    Social Network: Not on file   Past Surgical History:  Procedure Laterality Date   HERNIA REPAIR     IR ANGIO INTRA EXTRACRAN SEL COM CAROTID INNOMINATE BILAT MOD SED  03/09/2021   IR ANGIO VERTEBRAL SEL VERTEBRAL BILAT MOD SED  03/09/2021   IR US GUIDE VASC ACCESS RIGHT  03/09/2021   LOOP RECORDER INSERTION N/A 05/07/2022   Procedure: LOOP RECORDER INSERTION;  Surgeon: Lanier Prude, MD;  Location: [facility];  Service: Cardiovascular;  Laterality: N/A;   Past Surgical History:  Procedure Laterality Date   HERNIA REPAIR     IR ANGIO INTRA EXTRACRAN SEL COM CAROTID INNOMINATE BILAT MOD SED  03/09/2021   IR ANGIO VERTEBRAL SEL VERTEBRAL BILAT MOD SED  03/09/2021   IR US GUIDE VASC ACCESS RIGHT  03/09/2021   LOOP RECORDER INSERTION N/A 05/07/2022   Procedure: LOOP RECORDER INSERTION;  Surgeon: Lanier Prude, MD;  Location: [facility];  Service: Cardiovascular;  Laterality: N/A;   Past Medical History:  Diagnosis Date   Diabetes mellitus without complication (HCC)    Hypertension    Thyroid disease    BP (!) 193/86   Pulse 64   Ht 6\' 1"  (1.854 m)   Wt 232 lb (105.2 kg)   SpO2 98%   BMI 30.61 kg/m   Opioid Risk Score:   Fall Risk Score:  `1  Depression screen [facility] 2/9     08/15/2022   10:09 AM 02/28/2022    9:21 AM 12/06/2021    1:14 PM  08/23/2021    1:27 PM 06/15/2021    4:06 PM 05/09/2021   12:44 PM  Depression screen PHQ 2/9  Decreased Interest 0 0 3 1 0 1  Down, Depressed, Hopeless 0 0 3 1 0 1  PHQ - 2 Score 0 0 6 2 0 2  Altered sleeping      1  Tired, decreased energy      1  Change in appetite      1  Feeling bad or failure about yourself       1  Trouble concentrating      0  Moving slowly or fidgety/restless      0  Suicidal thoughts      0  PHQ-9 Score      6    Review of Systems  Musculoskeletal:  Positive for back pain and neck pain.       LT shoulder pain  All other systems reviewed and are negative.      Objective:   Physical Exam General: No acute distress HEENT: NCAT, EOMI, oral membranes moist Cards: reg rate  Chest: normal effort Abdomen: Soft, NT, ND Skin: dry, intact Extremities: no edema Psych: pleasant and appropriate  Skin: Clean and intact without signs of breakdown Neuro:  Alert and oriented x 3. Normal insight and awareness. Intact Memory. Normal language and speech. Cranial nerve exam unremarkable although he may have some left field loss vs left inattention. MMT: 5/5 RUE and RLE. LUE 4/5. LLE 4/5. Seems to lack fine motor movement and proprioception to a degree on left. . Neuro-motor exam unchanged today    Musculoskeletal: Neck remains tender with palpation.both traps are tight.            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 Pain/exam appears to coorelate more with C4-5 findings       Plan; Refer to [place] imaging for C7-T1 translaminar ESI. This may provide some relief although we're 3 levels away from the main problem Continue tizanidine 2mg , titrating up to 2mg  TID Consider surgical referral Cervical ROM exercises. Discussed posture as well. Outpt  therpaies Continue topamax-generally controlled 6.  Increase gabapentin to 300mg  tid. Continue tramadol for breakthrough pain 7.   Continue outpt therapies for posture, strength, gait 8. Needs follow up visual exam to return driving. Ultimately this may be an attention issue. He is still missing things on left.      Twenty minutes of face  to face patient care time were spent during this visit. All questions were encouraged and answered.  Follow up with me in 2 mos .

## 2022-10-10 NOTE — Patient Instructions (Signed)
ALWAYS FEEL FREE TO CALL OUR OFFICE WITH ANY PROBLEMS OR QUESTIONS [phone])  **PLEASE NOTE** ALL MEDICATION REFILL REQUESTS (INCLUDING CONTROLLED SUBSTANCES) NEED TO BE MADE AT LEAST 7 DAYS PRIOR TO REFILL BEING DUE. ANY REFILL REQUESTS INSIDE THAT TIME FRAME MAY RESULT IN DELAYS IN RECEIVING YOUR PRESCRIPTION.                   GABAPENTIN  100-100-300 FOR 3 DAYS, THEN  300--100-300 FOR  3 DAYS, THEN   300MG  THREE X DAILY

## 2022-10-11 ENCOUNTER — Telehealth: Payer: Self-pay | Admitting: Physical Medicine & Rehabilitation

## 2022-10-11 ENCOUNTER — Ambulatory Visit: Payer: Medicare HMO | Admitting: Physical Therapy

## 2022-10-11 ENCOUNTER — Ambulatory Visit: Payer: Medicare HMO | Admitting: Occupational Therapy

## 2022-10-11 NOTE — Telephone Encounter (Signed)
Patient called in and would like a referral sent to a spine specialist for possible surgery. He said it was discussed during visit and has decided he would like a referral to have a consultation

## 2022-10-12 ENCOUNTER — Other Ambulatory Visit: Payer: Self-pay

## 2022-10-12 ENCOUNTER — Encounter: Payer: Self-pay | Admitting: Physical Medicine & Rehabilitation

## 2022-10-12 NOTE — Addendum Note (Signed)
Addended by: Faith Rogue T on: 10/12/2022 09:31 AM   Modules accepted: Orders

## 2022-10-15 ENCOUNTER — Ambulatory Visit (INDEPENDENT_AMBULATORY_CARE_PROVIDER_SITE_OTHER): Payer: Medicare HMO

## 2022-10-15 DIAGNOSIS — I6310 Cerebral infarction due to embolism of unspecified precerebral artery: Secondary | ICD-10-CM

## 2022-10-18 ENCOUNTER — Other Ambulatory Visit: Payer: Self-pay | Admitting: Physical Medicine & Rehabilitation

## 2022-10-18 ENCOUNTER — Ambulatory Visit: Payer: Medicare HMO | Admitting: Physical Therapy

## 2022-10-18 ENCOUNTER — Ambulatory Visit: Payer: Medicare HMO | Admitting: Occupational Therapy

## 2022-10-18 DIAGNOSIS — M542 Cervicalgia: Secondary | ICD-10-CM

## 2022-10-18 DIAGNOSIS — M5412 Radiculopathy, cervical region: Secondary | ICD-10-CM

## 2022-10-18 DIAGNOSIS — I63511 Cerebral infarction due to unspecified occlusion or stenosis of right middle cerebral artery: Secondary | ICD-10-CM

## 2022-10-31 NOTE — Progress Notes (Signed)
Merlin Loop Recorder  

## 2022-11-01 ENCOUNTER — Encounter: Payer: Self-pay | Admitting: Physical Therapy

## 2022-11-01 ENCOUNTER — Ambulatory Visit: Payer: Medicare HMO | Admitting: Physical Therapy

## 2022-11-01 ENCOUNTER — Ambulatory Visit: Payer: Medicare HMO | Admitting: Occupational Therapy

## 2022-11-01 NOTE — Therapy (Signed)
[facility] [address] [place], [place], [address] Phone: [phone]   Fax:  [phone]  Patient Details  Name: Samiel Hedquist MRN: [id number] Date of Birth: [birth year] Referring Provider:  No ref. provider found  Encounter Date: 11/01/2022  PHYSICAL THERAPY DISCHARGE SUMMARY  Visits from Start of Care: 6  Current functional level related to goals / functional outcomes: Pt requires rollator and L AFO for safety w/mobility at SBA level    Remaining deficits: L hemiplegia, high fall risk    Education / Equipment: HEP   Patient agrees to discharge. Patient goals were not met. Patient is being discharged due to  pt's request - would like to resume PT after having surgery.  Jill Alexanders Chelsa Stout, PT 11/01/2022, 11:08 AM  [facility] [address] [place], [place], [address] Phone: [phone]   Fax:  [phone]

## 2022-11-08 ENCOUNTER — Ambulatory Visit: Payer: Medicare HMO | Admitting: Physical Therapy

## 2022-11-08 ENCOUNTER — Other Ambulatory Visit: Payer: Self-pay | Admitting: Neurosurgery

## 2022-11-08 ENCOUNTER — Encounter: Payer: Medicare HMO | Admitting: Occupational Therapy

## 2022-11-13 ENCOUNTER — Other Ambulatory Visit: Payer: Self-pay | Admitting: Neurosurgery

## 2022-11-14 NOTE — Progress Notes (Signed)
Surgical Instructions   Your procedure is scheduled on Friday, November 23, 2022. Report to [facility] Main Entrance "A" at 5:30 A.M., then check in with the Admitting office. Any questions or running late day of surgery: call [phone]  Questions prior to your surgery date: call [phone], Monday-Friday, 8am-4pm. If you experience any cold or flu symptoms such as cough, fever, chills, shortness of breath, etc. between now and your scheduled surgery, please notify us at the above number.     Remember:  Do not eat or drink after midnight the night before your surgery    Take these medicines the morning of surgery with A SIP OF WATER  carvedilol (COREG)  DULoxetine (CYMBALTA)  gabapentin (NEURONTIN)  levothyroxine (SYNTHROID)  rosuvastatin (CRESTOR)  topiramate (TOPAMAX)    May take these medicines IF NEEDED: acetaminophen (TYLENOL)  methocarbamol (ROBAXIN)  tiZANidine (ZANAFLEX) traMADol (ULTRAM)     Follow your surgeon's instructions on when to stop Aspirin.  If no instructions were given by your surgeon then you will need to call the office to get those instructions.     One week prior to surgery, STOP taking any Aleve, Naproxen, Ibuprofen, Motrin, Advil, Goody's, BC's, all herbal medications, fish oil, and non-prescription vitamins.    WHAT DO I DO ABOUT MY DIABETES MEDICATION?   Do not take oral diabetes medicines (pills) the morning of surgery.  Stop taking your dapagliflozin propanediol (FARXIGA) 72 hours prior to surgery, with the last dose being 11/19/2022.        Stop taking your Dulaglutide (TRULICITY) one week prior to surgery, with the last dose being no later that 12/16/2022.   THE NIGHT BEFORE SURGERY, take 20 units of your insulin detemir (LEVEMIR), which is 50% of your regular dose.        THE MORNING OF SURGERY, take zero units of your  insulin aspart (NOVOLOG). If your CBG is greater than 220 mg/dL, you may take  of your sliding scale (correction)  dose of insulin.   The day of surgery, do not take other diabetes injectables, including Byetta (exenatide), Bydureon (exenatide ER), Victoza (liraglutide), or Trulicity (dulaglutide).    HOW TO MANAGE YOUR DIABETES BEFORE AND AFTER SURGERY  Why is it important to control my blood sugar before and after surgery? Improving blood sugar levels before and after surgery helps healing and can limit problems. A way of improving blood sugar control is eating a healthy diet by:  Eating less sugar and carbohydrates  Increasing activity/exercise  Talking with your doctor about reaching your blood sugar goals High blood sugars (greater than 180 mg/dL) can raise your risk of infections and slow your recovery, so you will need to focus on controlling your diabetes during the weeks before surgery. Make sure that the doctor who takes care of your diabetes knows about your planned surgery including the date and location.  How do I manage my blood sugar before surgery? Check your blood sugar at least 4 times a day, starting 2 days before surgery, to make sure that the level is not too high or low.  Check your blood sugar the morning of your surgery when you wake up and every 2 hours until you get to the Short Stay unit.  If your blood sugar is less than 70 mg/dL, you will need to treat for low blood sugar: Do not take insulin. Treat a low blood sugar (less than 70 mg/dL) with  cup of clear juice (cranberry or apple), 4 glucose tablets, OR glucose  gel. Recheck blood sugar in 15 minutes after treatment (to make sure it is greater than 70 mg/dL). If your blood sugar is not greater than 70 mg/dL on recheck, call [phone] for further instructions. Report your blood sugar to the short stay nurse when you get to Short Stay.  If you are admitted to the hospital after surgery: Your blood sugar will be checked by the staff and you will probably be given insulin after surgery (instead of oral diabetes  medicines) to make sure you have good blood sugar levels. The goal for blood sugar control after surgery is 80-180 mg/dL.                      Do NOT Smoke (Tobacco/Vaping) for 24 hours prior to your procedure.  If you use a CPAP at night, you may bring your mask/headgear for your overnight stay.   You will be asked to remove any contacts, glasses, piercing's, hearing aid's, dentures/partials prior to surgery. Please bring cases for these items if needed.    Patients discharged the day of surgery will not be allowed to drive home, and someone needs to stay with them for 24 hours.  SURGICAL WAITING ROOM VISITATION Patients may have no more than 2 support people in the waiting area - these visitors may rotate.   Pre-op nurse will coordinate an appropriate time for 1 ADULT support person, who may not rotate, to accompany patient in pre-op.  Children under the age of 68 must have an adult with them who is not the patient and must remain in the main waiting area with an adult.  If the patient needs to stay at the hospital during part of their recovery, the visitor guidelines for inpatient rooms apply.  Please refer to the [facility] website for the visitor guidelines for any additional information.   If you received a COVID test during your pre-op visit  it is requested that you wear a mask when out in public, stay away from anyone that may not be feeling well and notify your surgeon if you develop symptoms. If you have been in contact with anyone that has tested positive in the last 10 days please notify you surgeon.      Pre-operative 5 CHG Bathing Instructions   You can play a key role in reducing the risk of infection after surgery. Your skin needs to be as free of germs as possible. You can reduce the number of germs on your skin by washing with CHG (chlorhexidine gluconate) soap before surgery. CHG is an antiseptic soap that kills germs and continues to kill germs even after washing.    DO NOT use if you have an allergy to chlorhexidine/CHG or antibacterial soaps. If your skin becomes reddened or irritated, stop using the CHG and notify one of our RNs at [phone].   Please shower with the CHG soap starting 4 days before surgery using the following schedule:     Please keep in mind the following:  DO NOT shave, including legs and underarms, starting the day of your first shower.   You may shave your face at any point before/day of surgery.  Place clean sheets on your bed the day you start using CHG soap. Use a clean washcloth (not used since being washed) for each shower. DO NOT sleep with pets once you start using the CHG.   CHG Shower Instructions:  If you choose to wash your hair and private area, wash first with  your normal shampoo/soap.  After you use shampoo/soap, rinse your hair and body thoroughly to remove shampoo/soap residue.  Turn the water OFF and apply about 3 tablespoons (45 ml) of CHG soap to a CLEAN washcloth.  Apply CHG soap ONLY FROM YOUR NECK DOWN TO YOUR TOES (washing for 3-5 minutes)  DO NOT use CHG soap on face, private areas, open wounds, or sores.  Pay special attention to the area where your surgery is being performed.  If you are having back surgery, having someone wash your back for you may be helpful. Wait 2 minutes after CHG soap is applied, then you may rinse off the CHG soap.  Pat dry with a clean towel  Put on clean clothes/pajamas   If you choose to wear lotion, please use ONLY the CHG-compatible lotions on the back of this paper.   Additional instructions for the day of surgery: DO NOT APPLY any lotions, deodorants or cologne.  Do not bring valuables to the hospital. [facility] is not responsible for any belongings/valuables. Do not wear jewelry  Put on clean/comfortable clothes.  Please brush your teeth.  Ask your nurse before applying any prescription medications to the skin.     CHG Compatible Lotions   Aveeno  Moisturizing lotion  Cetaphil Moisturizing Cream  Cetaphil Moisturizing Lotion  Clairol Herbal Essence Moisturizing Lotion, Dry Skin  Clairol Herbal Essence Moisturizing Lotion, Extra Dry Skin  Clairol Herbal Essence Moisturizing Lotion, Normal Skin  Curel Age Defying Therapeutic Moisturizing Lotion with Alpha Hydroxy  Curel Extreme Care Body Lotion  Curel Soothing Hands Moisturizing Hand Lotion  Curel Therapeutic Moisturizing Cream, Fragrance-Free  Curel Therapeutic Moisturizing Lotion, Fragrance-Free  Curel Therapeutic Moisturizing Lotion, Original Formula  Eucerin Daily Replenishing Lotion  Eucerin Dry Skin Therapy Plus Alpha Hydroxy Crme  Eucerin Dry Skin Therapy Plus Alpha Hydroxy Lotion  Eucerin Original Crme  Eucerin Original Lotion  Eucerin Plus Crme Eucerin Plus Lotion  Eucerin TriLipid Replenishing Lotion  Keri Anti-Bacterial Hand Lotion  Keri Deep Conditioning Original Lotion Dry Skin Formula Softly Scented  Keri Deep Conditioning Original Lotion, Fragrance Free Sensitive Skin Formula  Keri Lotion Fast Absorbing Fragrance Free Sensitive Skin Formula  Keri Lotion Fast Absorbing Softly Scented Dry Skin Formula  Keri Original Lotion  Keri Skin Renewal Lotion Keri Silky Smooth Lotion  Keri Silky Smooth Sensitive Skin Lotion  Nivea Body Creamy Conditioning Oil  Nivea Body Extra Enriched Lotion  Nivea Body Original Lotion  Nivea Body Sheer Moisturizing Lotion Nivea Crme  Nivea Skin Firming Lotion  NutraDerm 30 Skin Lotion  NutraDerm Skin Lotion  NutraDerm Therapeutic Skin Cream  NutraDerm Therapeutic Skin Lotion  ProShield Protective Hand Cream  Provon moisturizing lotion  Please read over the following fact sheets that you were given.

## 2022-11-15 ENCOUNTER — Ambulatory Visit (INDEPENDENT_AMBULATORY_CARE_PROVIDER_SITE_OTHER): Payer: Medicare HMO

## 2022-11-15 ENCOUNTER — Ambulatory Visit (HOSPITAL_COMMUNITY)
Admission: RE | Admit: 2022-11-15 | Discharge: 2022-11-15 | Disposition: A | Discharge status: Home / Self Care | Payer: Medicare HMO | Source: Ambulatory Visit | Attending: Neurosurgery | Admitting: Neurosurgery

## 2022-11-15 ENCOUNTER — Encounter: Payer: Medicare HMO | Admitting: Occupational Therapy

## 2022-11-15 ENCOUNTER — Encounter (HOSPITAL_COMMUNITY): Payer: Self-pay

## 2022-11-15 ENCOUNTER — Ambulatory Visit: Payer: Medicare HMO | Admitting: Physical Therapy

## 2022-11-15 ENCOUNTER — Other Ambulatory Visit: Payer: Self-pay

## 2022-11-15 VITALS — BP 141/58 | HR 73 | Temp 99.0°F | Resp 18 | Ht 73.0 in | Wt 233.0 lb

## 2022-11-15 DIAGNOSIS — I10 Essential (primary) hypertension: Secondary | ICD-10-CM | POA: Insufficient documentation

## 2022-11-15 DIAGNOSIS — I6310 Cerebral infarction due to embolism of unspecified precerebral artery: Secondary | ICD-10-CM | POA: Diagnosis not present

## 2022-11-15 DIAGNOSIS — E1165 Type 2 diabetes mellitus with hyperglycemia: Secondary | ICD-10-CM | POA: Insufficient documentation

## 2022-11-15 DIAGNOSIS — Z01818 Encounter for other preprocedural examination: Secondary | ICD-10-CM | POA: Insufficient documentation

## 2022-11-15 HISTORY — DX: Sleep apnea, unspecified: G47.30

## 2022-11-15 HISTORY — DX: Depression, unspecified: F32.A

## 2022-11-15 HISTORY — DX: Unspecified osteoarthritis, unspecified site: M19.90

## 2022-11-15 HISTORY — DX: Headache, unspecified: R51.9

## 2022-11-15 HISTORY — DX: Hypothyroidism, unspecified: E03.9

## 2022-11-15 HISTORY — DX: Anxiety disorder, unspecified: F41.9

## 2022-11-15 HISTORY — DX: Cerebral infarction, unspecified: I63.9

## 2022-11-15 LAB — HEMOGLOBIN A1C
Hgb A1c MFr Bld: 8.8 % — ABNORMAL HIGH (ref 4.8–5.6)
Mean Plasma Glucose: 205.86 mg/dL

## 2022-11-15 LAB — BASIC METABOLIC PANEL
Anion gap: 11 (ref 5–15)
BUN: 17 mg/dL (ref 8–23)
CO2: 26 mmol/L (ref 22–32)
Calcium: 9 mg/dL (ref 8.9–10.3)
Chloride: 103 mmol/L (ref 98–111)
Creatinine, Ser: 0.79 mg/dL (ref 0.61–1.24)
GFR, Estimated: >60 mL/min (ref >60)
Glucose, Bld: 314 mg/dL — ABNORMAL HIGH (ref 70–99)
Potassium: 4.3 mmol/L (ref 3.5–5.1)
Sodium: 140 mmol/L (ref 135–145)

## 2022-11-15 LAB — TYPE AND SCREEN
ABO/RH(D): O POS
Antibody Screen: NEGATIVE

## 2022-11-15 LAB — CBC
HCT: 40.3 % (ref 39.0–52.0)
Hemoglobin: 13.8 g/dL (ref 13.0–17.0)
MCH: 32.5 pg (ref 26.0–34.0)
MCHC: 34.2 g/dL (ref 30.0–36.0)
MCV: 94.8 fL (ref 80.0–100.0)
Platelets: 203 10*3/uL (ref 150–400)
RBC: 4.25 MIL/uL (ref 4.22–5.81)
RDW: 14.1 % (ref 11.5–15.5)
WBC: 7.6 10*3/uL (ref 4.0–10.5)
nRBC: 0 % (ref 0.0–0.2)

## 2022-11-15 LAB — CUP PACEART REMOTE DEVICE CHECK
Date Time Interrogation Session: 20240829020319
Implantable Pulse Generator Implant Date: 20240219
Pulse Gen Serial Number: 511022381

## 2022-11-15 LAB — SURGICAL PCR SCREEN
MRSA, PCR: POSITIVE — AB
Staphylococcus aureus: POSITIVE — AB

## 2022-11-15 LAB — GLUCOSE, CAPILLARY: Glucose-Capillary: 245 mg/dL — ABNORMAL HIGH (ref 70–99)

## 2022-11-15 NOTE — Progress Notes (Signed)
PCP - Dr. Francisca December Cardiologist - Pt sees Dr. Loman Brooklyn (loop recorder)  PPM/ICD - denies   Chest x-ray - 05/02/22 EKG - 05/02/22 Stress Test - Around 20 years ago per pt, in [facility], normal per pt ECHO - 05/03/22 Cardiac Cath - denies  Sleep Study - OSA+ CPAP - nightly, unsure of pressure settings  Fasting Blood Sugar - 145-345 Checks Blood Sugar twice a day  Last dose of GLP1 agonist-  11/04/22 GLP1 instructions: Hold 7 days  Blood Thinner Instructions: n/a Aspirin Instructions: Hold 5-7 days  ERAS Protcol - no, NPO   COVID TEST- n/a   Anesthesia review: yes, hx of multiple strokes, loop recorder  Patient denies shortness of breath, fever, cough and chest pain at PAT appointment   All instructions explained to the patient, with a verbal understanding of the material. Patient agrees to go over the instructions while at home for a better understanding.  The opportunity to ask questions was provided.

## 2022-11-16 NOTE — Progress Notes (Addendum)
Anesthesia Chart Review:  Case: [id number] Date/Time: 11/23/22 0715   Procedure: ACDF C45 - 3C   Anesthesia type: General   Pre-op diagnosis: STENOSIS OF CERVICAL SPINE WITH MYELOPATHY   Location: [facility] ROOM 21 / [facility]   Surgeons: Lisbeth Renshaw, MD       DISCUSSION: Patient is a 67 year old male scheduled for the above procedure.  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 Last neurology notation noted was on 05/07/22 prior to hospital discharge.   He had wall motion abnormalities on 03/11/21 and 05/03/22 echocardiograms. Findings on 05/03/22 echo showed LVEF 50-55% (previously 45-50%), hypokinesis/akinesis of the distal septal, distal inferior and apical walls which was unchanged since December 2022. There was mild LVH, grade 1 diastolic dysfunction, normal RV systolic function, moderately dilated LA, small pericardial effusion, mild MR. When Dr. 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 Plan was to repeat echo in six months and consider Lexiscan Myoview depending on results. He did not have office follow-up (except with EP for ILR), but had a repeat echo on 05/03/22 during CVA admission as mentioned with overall stable findings, EF improved to 50-55%. He is on Crestor 40 mg and HCTZ 12.5 mg twice daily as needed for fluid, Coreg 12.5 mg twice daily, aspirin 81 mg daily. 05/02/22 EKG showed SR, right BBB, LAFB, first degree  AV block which appears overall stable compared to 06/26/21 tracing.   Last cardiology visit was again inpatient with EP regarding consideration of ILR for cryptogenic stroke. Seen by Earle Gell, PA-C with EP on 05/07/22 prior to getting an Abbott Assert IQ ILR implanted.   10/15/22 ILR summary showed battery status okay, normal device function, no new symptom, tacky, bradycardia, or pause episodes, no new A-fib episodes.  A1c 8.8%. He reported home CBGs ~ 145-345.  He is on Farxiga 10 mg daily, Trulicity 3 mg weekly, Levemir 40 units every afternoon. He was instructed to hold Trulicity one week prior to surgery and Fargixa 72 hours before surgery. Last Trulicity recorded as 11/04/22.   Reviewed above with anesthesiologist Achille Rich, MD including neurology and cardiac history. Patient with multiple strokes, last < 9 months ago. Recommend neurology preoperative input. Will communicate with Lowella Bandy at Dr. Val Riles office. Patient reported instructions to hold ASA 5-7 days prior to surgery.     VS: BP (!) 141/58   Pulse 73   Temp 37.2 C   Resp 18   Ht 6\' 1"  (1.854 m)   Wt 105.7 kg   SpO2 95%   BMI 30.74 kg/m    PROVIDERS: [facility] is where he received primary care - Camnitz, Will, MD is EP (loop recorder). Primary cardiologist is listed as Charlton Haws, MD, although he only saw patient during hospital admission in March 2023.  - Marvel Plan, MD and Delia Heady, MD are neurologist (seen inpatient, last 05/07/22) - Faith Rogue, MD is PM&R - Corwin Levins, MD is endocrinologist ([facility])   LABS: Preoperative labs noted. A1c 8.8%.  He reported home CBGs around 145-345. (  all labs ordered are listed, but only abnormal results are displayed)  Labs Reviewed  SURGICAL PCR SCREEN - Abnormal; Notable for the following components:      Result Value   MRSA, PCR POSITIVE (*)    Staphylococcus aureus POSITIVE (*)    All other components within normal limits  GLUCOSE,  CAPILLARY - Abnormal; Notable for the following components:   Glucose-Capillary 245 (*)    All other components within normal limits  HEMOGLOBIN A1C - Abnormal; Notable for the following components:   Hgb A1c MFr Bld 8.8 (*)    All other components within normal limits  BASIC METABOLIC PANEL - Abnormal; Notable for the following components:   Glucose, Bld 314 (*)    All other components within normal limits  CBC  TYPE AND SCREEN     IMAGES: MRI C-spine 09/02/22: IMPRESSION: 1. At C4-5 there is a broad-based disc bulge with a broad central disc protrusion with mass effect on the ventral cervical spinal cord. Severe spinal stenosis. Bilateral uncovertebral degenerative changes. Severe bilateral foraminal stenosis. 2. Cervical spine spondylosis as described above. 3. No acute osseous injury of the cervical spine.   CT Head 05/06/22: IMPRESSION: 1. Overall unchanged size of a right parietal infarct with petechial hemorrhage. No frank hematoma is seen. Overall unchanged degree of surrounding edema, without significant midline shift. 2. Redemonstrated remote infarcts in the posterior right frontal lobe and right occipital lobe.   MRI Head & MRA Heada/Neck Neck 05/02/22: IMPRESSION: 1. Evaluation is limited by motion artifact. In addition, the MRI head could not be completed in its entirety. 2. Redemonstrated area of infarction with hemorrhage in the right temporoparietal region, which appears overall similar to the same day CT, when allowing for differences in technique. Edema causes mass effect on the right lateral ventricle and approximately 2 mm right-to-left midline shift. 3. No intracranial large vessel occlusion. Suspect severe stenosis in the left P2, although evaluation is limited by motion. 4. No hemodynamically significant stenosis in the neck.   EKG: 05/02/22: Sinus rhythm Prolonged PR interval 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 No  STEMI Confirmed by Theda Belfast ([id number]) on 05/02/2022 4:05:55 PM - Overall, EKG appears similar to tracing from 06/26/21.   CV: Cardiac event monitor 05/18/21 - 06/16/21: NSR No significant arrhythmia No PAF No AV block     Echo 05/03/22: IMPRESSIONS   1. Hypokinesis / akinesis of the distal septal, distal inferior and  apical walls Overall unchanged from echo done in Dec 2022. Left  ventricular ejection fraction, by estimation, is 50 to 55%. The left  ventricle has low normal function. There is mild  left ventricular hypertrophy. Left ventricular diastolic parameters are  consistent with Grade I diastolic dysfunction (impaired relaxation).   2. Right ventricular systolic function is normal. The right ventricular  size is normal.   3. Left atrial size was moderately dilated.   4. A small pericardial effusion is present.   5. The mitral valve is normal in structure. Mild mitral valve  regurgitation.   6. The aortic valve is tricuspid. Aortic valve regurgitation is trivial.  - Comparison echo 03/11/21: LVEF 45 to 50%, apical septal segment, apical inferior segment, and apex are akinetic, apical lateral segment, mid inferoseptal segment, apical anterior segment, mid inferior segment are hypokinetic, anterior wall, anterolateral wall, basal inferior segment, and basal inferoseptal segment are norma,l moderate concentric LVH, grade 2 diastolic dysfunction, normal RV systolic function, small to moderate pericardial effusion without evidence of tamponade,  trivial MR aortic valve sclerosis without evidence of aortic stenosis, negative interatrial shunt by bubble study; 06/17/17 [facility] echo LVEF 65-70%, no appreciable segmental abnormality, no evidence of PFO.   Nuclear stress test 01/24/15 [facility]): IMPRESSION:  1. No reversible ischemia or infarction.  2. Normal left ventricular wall motion.  3. Left ventricular ejection fraction 57%  4. Low-risk stress test findings*.    Past Medical  History:  Diagnosis Date   Anxiety    Arthritis    Depression    Diabetes mellitus without complication (HCC)    Headache    Hypertension    Hypothyroidism    Sleep apnea    wears CPAP   Stroke (HCC)    1 stroke 2/24, and 2 strokes on 03/09/21   Thyroid disease     Past Surgical History:  Procedure Laterality Date   GROIN DEBRIDEMENT Left    ingrown hair, possibly abscesses, surgery to treat infection   HERNIA REPAIR Left    inguinal   IR ANGIO INTRA EXTRACRAN SEL COM CAROTID INNOMINATE BILAT MOD SED  03/09/2021   IR ANGIO VERTEBRAL SEL VERTEBRAL BILAT MOD SED  03/09/2021   IR US GUIDE VASC ACCESS RIGHT  03/09/2021   LOOP RECORDER INSERTION N/A 05/07/2022   Procedure: LOOP RECORDER INSERTION;  Surgeon: Lanier Prude, MD;  [facility]: [facility];  Service: Cardiovascular;  Laterality: N/A;    MEDICATIONS:  acetaminophen (TYLENOL) 325 MG tablet   aspirin EC 81 MG tablet   carvedilol (COREG) 12.5 MG tablet   dapagliflozin propanediol (FARXIGA) 10 MG TABS tablet   docusate sodium (COLACE) 100 MG capsule   Dulaglutide (TRULICITY) 3 MG/0.5ML SOPN   DULoxetine (CYMBALTA) 20 MG capsule   DULoxetine (CYMBALTA) 30 MG capsule   gabapentin (NEURONTIN) 300 MG capsule   hydrochlorothiazide (HYDRODIURIL) 12.5 MG tablet   insulin aspart (NOVOLOG) 100 UNIT/ML injection   insulin detemir (LEVEMIR) 100 UNIT/ML FlexPen   KLOR-CON M20 20 MEQ tablet   levothyroxine (SYNTHROID) 125 MCG tablet   methocarbamol (ROBAXIN) 500 MG tablet   Multiple Vitamins-Minerals (ALIVE MENS GUMMY MULTIVITAMINS) CHEW   pantoprazole (PROTONIX) 40 MG tablet   rosuvastatin (CRESTOR) 40 MG tablet   tiZANidine (ZANAFLEX) 2 MG tablet   topiramate (TOPAMAX) 50 MG tablet   traMADol (ULTRAM) 50 MG tablet   No current facility-administered medications for this encounter.    Shonna Chock, PA-C Surgical Short Stay/Anesthesiology [facility] Phone [phone] [facility] Phone [phone] 11/16/2022 6:16  PM

## 2022-11-16 NOTE — Progress Notes (Signed)
Nikki, with Dr. Val Riles office, notified (LVM) of positive PCR results

## 2022-11-21 ENCOUNTER — Telehealth: Payer: Self-pay | Admitting: Neurology

## 2022-11-21 NOTE — Telephone Encounter (Signed)
Received a curgical clearance form to be completed. Pt has upcoming procedure with neurosurgery and is needing neurology clearance. Pt was last seen in ov 06/15/2021. Had a follow apt 09/28/2021 but left without being seen. Will send to Shanda Bumps to review the chart and evaluate for clearance.

## 2022-11-21 NOTE — Telephone Encounter (Signed)
Patient with history of stroke in 02/2021 currently on aspirin therapy. As long as no new stroke/TIAs or symptoms over the past 6 months, okay to hold aspirin for 3 to 5 days prior to procedure with small but acceptable risk of recurrent stroke while off therapy.  Recommend restarting immediately after or once stable.

## 2022-11-22 ENCOUNTER — Encounter: Payer: Medicare HMO | Admitting: Occupational Therapy

## 2022-11-22 ENCOUNTER — Ambulatory Visit: Payer: Medicare HMO | Admitting: Physical Therapy

## 2022-11-22 NOTE — Progress Notes (Signed)
[facility] Summary Report / Loop Recorder 

## 2022-11-22 NOTE — Telephone Encounter (Signed)
Faxed the surgical clearance form and received confirmation

## 2022-11-22 NOTE — Anesthesia Preprocedure Evaluation (Addendum)
Anesthesia Evaluation  Patient identified by MRN, date of birth, ID band Patient awake    Reviewed: Allergy & Precautions, NPO status , Patient's Chart, lab work & pertinent test results, reviewed documented beta blocker date and time   History of Anesthesia Complications Negative for: history of anesthetic complications  Airway Mallampati: II  TM Distance: >3 FB Neck ROM: Limited    Dental  (+) Poor Dentition, Missing, Loose,    Pulmonary sleep apnea    breath sounds clear to auscultation       Cardiovascular hypertension, (-) CAD, (-) Past MI, (-) Cardiac Stents, (-) CABG and (-) CHF  Rhythm:Regular Rate:Normal     Neuro/Psych  Headaches PSYCHIATRIC DISORDERS Anxiety Depression    CVA, Residual Symptoms    GI/Hepatic ,neg GERD  ,,(+) neg Cirrhosis        Endo/Other  diabetesHypothyroidism    Renal/GU Renal disease     Musculoskeletal  (+) Arthritis ,    Abdominal   Peds  Hematology   Anesthesia Other Findings   Reproductive/Obstetrics                              Anesthesia Physical Anesthesia Plan  ASA: 3  Anesthesia Plan: General   Post-op Pain Management:    Induction: Intravenous  PONV Risk Score and Plan: 1 and Dexamethasone and Ondansetron  Airway Management Planned: Oral ETT and Video Laryngoscope Planned  Additional Equipment:   Intra-op Plan:   Post-operative Plan: Extubation in OR  Informed Consent: I have reviewed the patients History and Physical, chart, labs and discussed the procedure including the risks, benefits and alternatives for the proposed anesthesia with the patient or authorized representative who has indicated his/her understanding and acceptance.     Dental advisory given  Plan Discussed with: CRNA  Anesthesia Plan Comments: (PAT note written 11/22/2022 by Shonna Chock, PA-C.  )        Anesthesia Quick Evaluation

## 2022-11-23 ENCOUNTER — Ambulatory Visit (HOSPITAL_BASED_OUTPATIENT_CLINIC_OR_DEPARTMENT_OTHER): Payer: Medicare HMO | Admitting: Physician Assistant

## 2022-11-23 ENCOUNTER — Other Ambulatory Visit: Payer: Self-pay

## 2022-11-23 ENCOUNTER — Encounter (HOSPITAL_COMMUNITY)
Admission: RE | Disposition: A | Discharge status: Home / Self Care | Payer: Self-pay | Source: Home / Self Care | Attending: Neurosurgery

## 2022-11-23 ENCOUNTER — Ambulatory Visit (HOSPITAL_COMMUNITY): Payer: Medicare HMO

## 2022-11-23 ENCOUNTER — Observation Stay (HOSPITAL_COMMUNITY)
Admission: RE | Admit: 2022-11-23 | Discharge: 2022-11-24 | Disposition: A | Discharge status: Home / Self Care | Payer: Medicare HMO | Attending: Neurosurgery | Admitting: Neurosurgery

## 2022-11-23 ENCOUNTER — Ambulatory Visit (HOSPITAL_COMMUNITY): Payer: Medicare HMO | Admitting: Vascular Surgery

## 2022-11-23 ENCOUNTER — Encounter (HOSPITAL_COMMUNITY): Payer: Self-pay | Admitting: Neurosurgery

## 2022-11-23 DIAGNOSIS — E039 Hypothyroidism, unspecified: Secondary | ICD-10-CM | POA: Insufficient documentation

## 2022-11-23 DIAGNOSIS — E1165 Type 2 diabetes mellitus with hyperglycemia: Secondary | ICD-10-CM

## 2022-11-23 DIAGNOSIS — Z7985 Long-term (current) use of injectable non-insulin antidiabetic drugs: Secondary | ICD-10-CM | POA: Diagnosis not present

## 2022-11-23 DIAGNOSIS — E119 Type 2 diabetes mellitus without complications: Secondary | ICD-10-CM | POA: Diagnosis not present

## 2022-11-23 DIAGNOSIS — E785 Hyperlipidemia, unspecified: Secondary | ICD-10-CM

## 2022-11-23 DIAGNOSIS — M4802 Spinal stenosis, cervical region: Secondary | ICD-10-CM | POA: Diagnosis present

## 2022-11-23 DIAGNOSIS — Z79899 Other long term (current) drug therapy: Secondary | ICD-10-CM | POA: Insufficient documentation

## 2022-11-23 DIAGNOSIS — Z794 Long term (current) use of insulin: Secondary | ICD-10-CM | POA: Insufficient documentation

## 2022-11-23 DIAGNOSIS — I10 Essential (primary) hypertension: Secondary | ICD-10-CM | POA: Insufficient documentation

## 2022-11-23 DIAGNOSIS — Z7982 Long term (current) use of aspirin: Secondary | ICD-10-CM | POA: Insufficient documentation

## 2022-11-23 DIAGNOSIS — Z8673 Personal history of transient ischemic attack (TIA), and cerebral infarction without residual deficits: Secondary | ICD-10-CM | POA: Insufficient documentation

## 2022-11-23 HISTORY — PX: ANTERIOR CERVICAL DECOMP/DISCECTOMY FUSION: SHX1161

## 2022-11-23 LAB — GLUCOSE, CAPILLARY
Glucose-Capillary: 291 mg/dL — ABNORMAL HIGH (ref 70–99)
Glucose-Capillary: 270 mg/dL — ABNORMAL HIGH (ref 70–99)
Glucose-Capillary: 188 mg/dL — ABNORMAL HIGH (ref 70–99)
Glucose-Capillary: 176 mg/dL — ABNORMAL HIGH (ref 70–99)
Glucose-Capillary: 256 mg/dL — ABNORMAL HIGH (ref 70–99)
Glucose-Capillary: 242 mg/dL — ABNORMAL HIGH (ref 70–99)

## 2022-11-23 LAB — ABO/RH: ABO/RH(D): O POS

## 2022-11-23 SURGERY — ANTERIOR CERVICAL DECOMPRESSION/DISCECTOMY FUSION 1 LEVEL
Anesthesia: General

## 2022-11-23 MED ORDER — ONDANSETRON HCL 4 MG/2ML IJ SOLN: 4.0000 mg | Freq: Four times a day (QID) | INTRAMUSCULAR | Status: DC | PRN

## 2022-11-23 MED ORDER — EPHEDRINE 5 MG/ML INJ
INTRAVENOUS | Status: AC
  Filled 2022-11-23: qty 5

## 2022-11-23 MED ORDER — CHLORHEXIDINE GLUCONATE CLOTH 2 % EX PADS: 6.0000 | MEDICATED_PAD | Freq: Once | CUTANEOUS | Status: DC

## 2022-11-23 MED ORDER — PROPOFOL 10 MG/ML IV BOLUS
INTRAVENOUS | Status: AC
  Filled 2022-11-23: qty 20

## 2022-11-23 MED ORDER — LIDOCAINE-EPINEPHRINE 1 %-1:100000 IJ SOLN
INTRAMUSCULAR | Status: DC | PRN
  Administered 2022-11-23: 4.5 mL

## 2022-11-23 MED ORDER — POLYETHYLENE GLYCOL 3350 17 G PO PACK: 17.0000 g | PACK | Freq: Every day | ORAL | Status: DC | PRN

## 2022-11-23 MED ORDER — DULOXETINE HCL 30 MG PO CPEP
30.0000 mg | ORAL_CAPSULE | Freq: Two times a day (BID) | ORAL | Status: DC
  Administered 2022-11-23 – 2022-11-24 (×2): 30 mg via ORAL
  Filled 2022-11-23 (×2): qty 1

## 2022-11-23 MED ORDER — ONDANSETRON HCL 4 MG/2ML IJ SOLN
INTRAMUSCULAR | Status: AC
  Filled 2022-11-23: qty 2

## 2022-11-23 MED ORDER — ORAL CARE MOUTH RINSE: 15.0000 mL | Freq: Once | OROMUCOSAL | Status: AC

## 2022-11-23 MED ORDER — DEXAMETHASONE SODIUM PHOSPHATE 10 MG/ML IJ SOLN
INTRAMUSCULAR | Status: DC | PRN
  Administered 2022-11-23: 5 mg via INTRAVENOUS

## 2022-11-23 MED ORDER — DAPAGLIFLOZIN PROPANEDIOL 10 MG PO TABS
10.0000 mg | ORAL_TABLET | Freq: Every day | ORAL | Status: DC
  Administered 2022-11-23 – 2022-11-24 (×2): 10 mg via ORAL
  Filled 2022-11-23 (×2): qty 1

## 2022-11-23 MED ORDER — DEXMEDETOMIDINE HCL IN NACL 80 MCG/20ML IV SOLN
INTRAVENOUS | Status: AC
  Filled 2022-11-23: qty 20

## 2022-11-23 MED ORDER — ACETAMINOPHEN 10 MG/ML IV SOLN: 1000.0000 mg | Freq: Once | INTRAVENOUS | Status: DC | PRN

## 2022-11-23 MED ORDER — INSULIN ASPART 100 UNIT/ML IJ SOLN
0.0000 [IU] | Freq: Three times a day (TID) | INTRAMUSCULAR | Status: DC
  Administered 2022-11-23: 11 [IU] via SUBCUTANEOUS
  Administered 2022-11-23 – 2022-11-24 (×2): 4 [IU] via SUBCUTANEOUS

## 2022-11-23 MED ORDER — PHENYLEPHRINE HCL-NACL 20-0.9 MG/250ML-% IV SOLN
INTRAVENOUS | Status: AC
  Filled 2022-11-23: qty 250

## 2022-11-23 MED ORDER — MIDAZOLAM HCL 2 MG/2ML IJ SOLN
INTRAMUSCULAR | Status: AC
  Filled 2022-11-23: qty 2

## 2022-11-23 MED ORDER — SODIUM CHLORIDE 0.9% FLUSH: 3.0000 mL | INTRAVENOUS | Status: DC | PRN

## 2022-11-23 MED ORDER — DEXMEDETOMIDINE HCL IN NACL 80 MCG/20ML IV SOLN
INTRAVENOUS | Status: DC | PRN
Start: 2022-11-23 — End: 2022-11-23
  Administered 2022-11-23: 4 ug via INTRAVENOUS

## 2022-11-23 MED ORDER — LIDOCAINE 2% (20 MG/ML) 5 ML SYRINGE
INTRAMUSCULAR | Status: AC
  Filled 2022-11-23: qty 5

## 2022-11-23 MED ORDER — EPHEDRINE SULFATE (PRESSORS) 50 MG/ML IJ SOLN: INTRAMUSCULAR | Status: DC | PRN

## 2022-11-23 MED ORDER — PANTOPRAZOLE SODIUM 40 MG PO TBEC
40.0000 mg | DELAYED_RELEASE_TABLET | Freq: Every day | ORAL | Status: DC
  Administered 2022-11-23: 40 mg via ORAL
  Filled 2022-11-23: qty 1

## 2022-11-23 MED ORDER — EPHEDRINE SULFATE-NACL 50-0.9 MG/10ML-% IV SOSY
PREFILLED_SYRINGE | INTRAVENOUS | Status: DC | PRN
  Administered 2022-11-23: 5 mg via INTRAVENOUS

## 2022-11-23 MED ORDER — GABAPENTIN 300 MG PO CAPS
300.0000 mg | ORAL_CAPSULE | Freq: Three times a day (TID) | ORAL | Status: DC
  Administered 2022-11-23 – 2022-11-24 (×3): 300 mg via ORAL
  Filled 2022-11-23 (×3): qty 1

## 2022-11-23 MED ORDER — KETAMINE HCL 50 MG/5ML IJ SOSY
PREFILLED_SYRINGE | INTRAMUSCULAR | Status: AC
  Filled 2022-11-23: qty 5

## 2022-11-23 MED ORDER — POTASSIUM CHLORIDE CRYS ER 20 MEQ PO TBCR: 20.0000 meq | EXTENDED_RELEASE_TABLET | Freq: Every day | ORAL | Status: DC | PRN

## 2022-11-23 MED ORDER — SENNA 8.6 MG PO TABS
1.0000 | ORAL_TABLET | Freq: Two times a day (BID) | ORAL | Status: DC
  Administered 2022-11-23 – 2022-11-24 (×2): 8.6 mg via ORAL
  Filled 2022-11-23 (×2): qty 1

## 2022-11-23 MED ORDER — METHOCARBAMOL 500 MG PO TABS: 500.0000 mg | ORAL_TABLET | Freq: Four times a day (QID) | ORAL | Status: DC | PRN

## 2022-11-23 MED ORDER — METHOCARBAMOL 1000 MG/10ML IJ SOLN: 500.0000 mg | Freq: Four times a day (QID) | INTRAVENOUS | Status: DC | PRN

## 2022-11-23 MED ORDER — FLEET ENEMA RE ENEM: 1.0000 | ENEMA | Freq: Once | RECTAL | Status: DC | PRN

## 2022-11-23 MED ORDER — PROPOFOL 10 MG/ML IV BOLUS
INTRAVENOUS | Status: DC | PRN
  Administered 2022-11-23: 150 mg via INTRAVENOUS

## 2022-11-23 MED ORDER — INSULIN ASPART 100 UNIT/ML IJ SOLN
0.0000 [IU] | INTRAMUSCULAR | Status: AC | PRN
  Administered 2022-11-23 (×2): 8 [IU] via SUBCUTANEOUS
  Filled 2022-11-23 (×2): qty 1

## 2022-11-23 MED ORDER — BISACODYL 10 MG RE SUPP: 10.0000 mg | Freq: Every day | RECTAL | Status: DC | PRN

## 2022-11-23 MED ORDER — ROSUVASTATIN CALCIUM 20 MG PO TABS
40.0000 mg | ORAL_TABLET | Freq: Every day | ORAL | Status: DC
  Administered 2022-11-24: 40 mg via ORAL
  Filled 2022-11-23: qty 2

## 2022-11-23 MED ORDER — CARVEDILOL 12.5 MG PO TABS
12.5000 mg | ORAL_TABLET | Freq: Two times a day (BID) | ORAL | Status: DC
  Administered 2022-11-23 – 2022-11-24 (×2): 12.5 mg via ORAL
  Filled 2022-11-23 (×2): qty 1

## 2022-11-23 MED ORDER — MIDAZOLAM HCL 2 MG/2ML IJ SOLN
INTRAMUSCULAR | Status: DC | PRN
  Administered 2022-11-23: 2 mg via INTRAVENOUS

## 2022-11-23 MED ORDER — METHOCARBAMOL 500 MG PO TABS
500.0000 mg | ORAL_TABLET | Freq: Four times a day (QID) | ORAL | Status: DC | PRN
  Administered 2022-11-23 – 2022-11-24 (×3): 500 mg via ORAL
  Filled 2022-11-23 (×3): qty 1

## 2022-11-23 MED ORDER — ONDANSETRON HCL 4 MG PO TABS: 4.0000 mg | ORAL_TABLET | Freq: Four times a day (QID) | ORAL | Status: DC | PRN

## 2022-11-23 MED ORDER — OXYCODONE HCL 5 MG PO TABS
10.0000 mg | ORAL_TABLET | ORAL | Status: DC | PRN
  Administered 2022-11-23 – 2022-11-24 (×4): 10 mg via ORAL
  Filled 2022-11-23 (×4): qty 2

## 2022-11-23 MED ORDER — FENTANYL CITRATE (PF) 100 MCG/2ML IJ SOLN: 25.0000 ug | INTRAMUSCULAR | Status: DC | PRN

## 2022-11-23 MED ORDER — BUPIVACAINE HCL (PF) 0.5 % IJ SOLN
INTRAMUSCULAR | Status: AC
  Filled 2022-11-23: qty 30

## 2022-11-23 MED ORDER — CHLORHEXIDINE GLUCONATE 0.12 % MT SOLN
15.0000 mL | Freq: Once | OROMUCOSAL | Status: AC
  Administered 2022-11-23: 15 mL via OROMUCOSAL
  Filled 2022-11-23: qty 15

## 2022-11-23 MED ORDER — THROMBIN 5000 UNITS EX SOLR
OROMUCOSAL | Status: DC | PRN
  Administered 2022-11-23: 5 mL via TOPICAL

## 2022-11-23 MED ORDER — KETAMINE HCL 10 MG/ML IJ SOLN
INTRAMUSCULAR | Status: DC | PRN
Start: 2022-11-23 — End: 2022-11-23
  Administered 2022-11-23: 25 mg via INTRAVENOUS

## 2022-11-23 MED ORDER — SUGAMMADEX SODIUM 200 MG/2ML IV SOLN
INTRAVENOUS | Status: DC | PRN
  Administered 2022-11-23: 100 mg via INTRAVENOUS

## 2022-11-23 MED ORDER — ONDANSETRON HCL 4 MG/2ML IJ SOLN: 4.0000 mg | Freq: Once | INTRAMUSCULAR | Status: DC | PRN

## 2022-11-23 MED ORDER — OXYCODONE HCL 5 MG/5ML PO SOLN: 5.0000 mg | Freq: Once | ORAL | Status: DC | PRN

## 2022-11-23 MED ORDER — SODIUM CHLORIDE 0.9% FLUSH
3.0000 mL | Freq: Two times a day (BID) | INTRAVENOUS | Status: DC
  Administered 2022-11-23 (×2): 3 mL via INTRAVENOUS

## 2022-11-23 MED ORDER — SODIUM CHLORIDE 0.9 % IV SOLN: INTRAVENOUS | Status: DC

## 2022-11-23 MED ORDER — ACETAMINOPHEN 325 MG PO TABS
650.0000 mg | ORAL_TABLET | ORAL | Status: DC | PRN
  Administered 2022-11-24: 650 mg via ORAL
  Filled 2022-11-23: qty 2

## 2022-11-23 MED ORDER — 0.9 % SODIUM CHLORIDE (POUR BTL) OPTIME
TOPICAL | Status: DC | PRN
  Administered 2022-11-23: 1000 mL

## 2022-11-23 MED ORDER — OXYCODONE HCL 5 MG PO TABS: 5.0000 mg | ORAL_TABLET | Freq: Once | ORAL | Status: DC | PRN

## 2022-11-23 MED ORDER — SODIUM CHLORIDE 0.9 % IV SOLN: 250.0000 mL | INTRAVENOUS | Status: DC

## 2022-11-23 MED ORDER — ROCURONIUM BROMIDE 10 MG/ML (PF) SYRINGE
PREFILLED_SYRINGE | INTRAVENOUS | Status: DC | PRN
  Administered 2022-11-23: 70 mg via INTRAVENOUS
  Administered 2022-11-23: 10 mg via INTRAVENOUS

## 2022-11-23 MED ORDER — OXYCODONE HCL 5 MG PO TABS
5.0000 mg | ORAL_TABLET | ORAL | Status: DC | PRN
  Administered 2022-11-23: 5 mg via ORAL
  Filled 2022-11-23: qty 1

## 2022-11-23 MED ORDER — BUPIVACAINE HCL 0.5 % IJ SOLN
INTRAMUSCULAR | Status: DC | PRN
  Administered 2022-11-23: 4.5 mL

## 2022-11-23 MED ORDER — MENTHOL 3 MG MT LOZG: 1.0000 | LOZENGE | OROMUCOSAL | Status: DC | PRN

## 2022-11-23 MED ORDER — LIDOCAINE-EPINEPHRINE 1 %-1:100000 IJ SOLN
INTRAMUSCULAR | Status: AC
  Filled 2022-11-23: qty 1

## 2022-11-23 MED ORDER — DOCUSATE SODIUM 100 MG PO CAPS
100.0000 mg | ORAL_CAPSULE | Freq: Two times a day (BID) | ORAL | Status: DC
  Administered 2022-11-23 – 2022-11-24 (×3): 100 mg via ORAL
  Filled 2022-11-23 (×3): qty 1

## 2022-11-23 MED ORDER — LEVOTHYROXINE SODIUM 25 MCG PO TABS
125.0000 ug | ORAL_TABLET | Freq: Every day | ORAL | Status: DC
  Administered 2022-11-24: 125 ug via ORAL
  Filled 2022-11-23: qty 1

## 2022-11-23 MED ORDER — PANTOPRAZOLE SODIUM 40 MG IV SOLR: 40.0000 mg | Freq: Every day | INTRAVENOUS | Status: DC

## 2022-11-23 MED ORDER — ONDANSETRON HCL 4 MG/2ML IJ SOLN
INTRAMUSCULAR | Status: DC | PRN
  Administered 2022-11-23: 4 mg via INTRAVENOUS

## 2022-11-23 MED ORDER — TOPIRAMATE 25 MG PO TABS
50.0000 mg | ORAL_TABLET | Freq: Every day | ORAL | Status: DC
  Administered 2022-11-24: 50 mg via ORAL
  Filled 2022-11-23: qty 2

## 2022-11-23 MED ORDER — THROMBIN 5000 UNITS EX SOLR
CUTANEOUS | Status: AC
  Filled 2022-11-23: qty 15000

## 2022-11-23 MED ORDER — DOCUSATE SODIUM 100 MG PO CAPS: 100.0000 mg | ORAL_CAPSULE | Freq: Two times a day (BID) | ORAL | Status: DC

## 2022-11-23 MED ORDER — VANCOMYCIN HCL IN DEXTROSE 1-5 GM/200ML-% IV SOLN
1000.0000 mg | Freq: Once | INTRAVENOUS | Status: AC
  Administered 2022-11-23: 1000 mg via INTRAVENOUS
  Filled 2022-11-23: qty 200

## 2022-11-23 MED ORDER — INSULIN ASPART 100 UNIT/ML IJ SOLN
4.0000 [IU] | Freq: Three times a day (TID) | INTRAMUSCULAR | Status: DC
  Administered 2022-11-23 – 2022-11-24 (×4): 4 [IU] via SUBCUTANEOUS

## 2022-11-23 MED ORDER — PHENYLEPHRINE HCL-NACL 20-0.9 MG/250ML-% IV SOLN
INTRAVENOUS | Status: DC | PRN
  Administered 2022-11-23: 50 ug/min via INTRAVENOUS

## 2022-11-23 MED ORDER — LACTATED RINGERS IV SOLN: INTRAVENOUS | Status: DC

## 2022-11-23 MED ORDER — LIDOCAINE 2% (20 MG/ML) 5 ML SYRINGE
INTRAMUSCULAR | Status: DC | PRN
  Administered 2022-11-23: 100 mg via INTRAVENOUS

## 2022-11-23 MED ORDER — PHENOL 1.4 % MT LIQD: 1.0000 | OROMUCOSAL | Status: DC | PRN

## 2022-11-23 MED ORDER — ACETAMINOPHEN 650 MG RE SUPP: 650.0000 mg | RECTAL | Status: DC | PRN

## 2022-11-23 MED ORDER — ROCURONIUM BROMIDE 10 MG/ML (PF) SYRINGE
PREFILLED_SYRINGE | INTRAVENOUS | Status: AC
  Filled 2022-11-23: qty 10

## 2022-11-23 MED ORDER — INSULIN DETEMIR 100 UNIT/ML ~~LOC~~ SOLN
40.0000 [IU] | Freq: Every evening | SUBCUTANEOUS | Status: DC
  Administered 2022-11-23: 40 [IU] via SUBCUTANEOUS
  Filled 2022-11-23 (×2): qty 0.4

## 2022-11-23 MED ORDER — DULAGLUTIDE 3 MG/0.5ML ~~LOC~~ SOAJ: 3.0000 mg | SUBCUTANEOUS | Status: DC

## 2022-11-23 MED ORDER — CEFAZOLIN SODIUM-DEXTROSE 2-4 GM/100ML-% IV SOLN
2.0000 g | INTRAVENOUS | Status: AC
  Administered 2022-11-23: 2 g via INTRAVENOUS
  Filled 2022-11-23: qty 100

## 2022-11-23 MED ORDER — DEXAMETHASONE SODIUM PHOSPHATE 10 MG/ML IJ SOLN
INTRAMUSCULAR | Status: AC
  Filled 2022-11-23: qty 1

## 2022-11-23 MED ORDER — MORPHINE SULFATE (PF) 2 MG/ML IV SOLN: 2.0000 mg | INTRAVENOUS | Status: DC | PRN

## 2022-11-23 MED ORDER — HYDROCHLOROTHIAZIDE 12.5 MG PO TABS: 12.5000 mg | ORAL_TABLET | Freq: Two times a day (BID) | ORAL | Status: DC | PRN

## 2022-11-23 MED ORDER — CEFAZOLIN SODIUM-DEXTROSE 2-4 GM/100ML-% IV SOLN
2.0000 g | Freq: Three times a day (TID) | INTRAVENOUS | Status: AC
  Administered 2022-11-23 (×2): 2 g via INTRAVENOUS
  Filled 2022-11-23 (×2): qty 100

## 2022-11-23 SURGICAL SUPPLY — 56 items
ADH SKN CLS APL DERMABOND .7 (GAUZE/BANDAGES/DRESSINGS) ×1
APL SKNCLS STERI-STRIP NONHPOA (GAUZE/BANDAGES/DRESSINGS) ×1
BAG COUNTER SPONGE SURGICOUNT (BAG) ×1 IMPLANT
BAG SPNG CNTER NS LX DISP (BAG) ×1
BENZOIN TINCTURE PRP APPL 2/3 (GAUZE/BANDAGES/DRESSINGS) IMPLANT
BLADE CLIPPER SURG (BLADE) IMPLANT
BLADE SURG 11 STRL SS (BLADE) ×1 IMPLANT
BLADE ULTRA TIP 2M (BLADE) IMPLANT
BUR MATCHSTICK NEURO 3.0 LAGG (BURR) ×1 IMPLANT
CANISTER SUCT 3000ML PPV (MISCELLANEOUS) ×1 IMPLANT
DERMABOND ADVANCED .7 DNX12 (GAUZE/BANDAGES/DRESSINGS) ×1 IMPLANT
DEVICE ENDSKLTN TC NANOLCK 6MM (Cage) IMPLANT
DRAPE C-ARM 42X72 X-RAY (DRAPES) ×2 IMPLANT
DRAPE HALF SHEET 40X57 (DRAPES) IMPLANT
DRAPE LAPAROTOMY 100X72 PEDS (DRAPES) ×1 IMPLANT
DRAPE MICROSCOPE SLANT 54X150 (MISCELLANEOUS) ×1 IMPLANT
DRSG OPSITE 4X5.5 SM (GAUZE/BANDAGES/DRESSINGS) ×2 IMPLANT
DURAPREP 6ML APPLICATOR 50/CS (WOUND CARE) ×1 IMPLANT
ELECT COATED BLADE 2.86 ST (ELECTRODE) ×1 IMPLANT
ELECT REM PT RETURN 9FT ADLT (ELECTROSURGICAL) ×1
ELECTRODE REM PT RTRN 9FT ADLT (ELECTROSURGICAL) ×1 IMPLANT
ENDOSKELETON TC NANOLOCK 6MM (Cage) ×1 IMPLANT
GAUZE 4X4 16PLY ~~LOC~~+RFID DBL (SPONGE) IMPLANT
GLOVE BIOGEL PI IND STRL 7.5 (GLOVE) ×1 IMPLANT
GLOVE ECLIPSE 7.0 STRL STRAW (GLOVE) ×2 IMPLANT
GLOVE EXAM NITRILE XL STR (GLOVE) IMPLANT
GLOVE SURG ENC MOIS LTX SZ7 (GLOVE) IMPLANT
GOWN STRL REUS W/ TWL LRG LVL3 (GOWN DISPOSABLE) ×2 IMPLANT
GOWN STRL REUS W/ TWL XL LVL3 (GOWN DISPOSABLE) IMPLANT
GOWN STRL REUS W/TWL 2XL LVL3 (GOWN DISPOSABLE) IMPLANT
GOWN STRL REUS W/TWL LRG LVL3 (GOWN DISPOSABLE) ×2
GOWN STRL REUS W/TWL XL LVL3 (GOWN DISPOSABLE) ×2
HEMOSTAT POWDER KIT SURGIFOAM (HEMOSTASIS) ×1 IMPLANT
KIT BASIN OR (CUSTOM PROCEDURE TRAY) ×1 IMPLANT
KIT TURNOVER KIT B (KITS) ×1 IMPLANT
NDL HYPO 22X1.5 SAFETY MO (MISCELLANEOUS) ×1 IMPLANT
NDL SPNL 22GX3.5 QUINCKE BK (NEEDLE) ×1 IMPLANT
NEEDLE HYPO 22X1.5 SAFETY MO (MISCELLANEOUS) ×1 IMPLANT
NEEDLE SPNL 22GX3.5 QUINCKE BK (NEEDLE) ×1 IMPLANT
NS IRRIG 1000ML POUR BTL (IV SOLUTION) ×1 IMPLANT
PACK LAMINECTOMY NEURO (CUSTOM PROCEDURE TRAY) ×1 IMPLANT
PAD ARMBOARD 7.5X6 YLW CONV (MISCELLANEOUS) ×3 IMPLANT
PLATE ZEVO 1LVL 17MM (Plate) IMPLANT
PUTTY DBF 1CC CORTICAL FIBERS (Putty) IMPLANT
SCREW 3.5 SELFDRILL 15MM VARI (Screw) IMPLANT
SPIKE FLUID TRANSFER (MISCELLANEOUS) ×2 IMPLANT
SPONGE INTESTINAL PEANUT (DISPOSABLE) ×1 IMPLANT
SPONGE SURGIFOAM ABS GEL SZ50 (HEMOSTASIS) ×1 IMPLANT
STAPLER VISISTAT 35W (STAPLE) ×1 IMPLANT
STRIP CLOSURE SKIN 1/2X4 (GAUZE/BANDAGES/DRESSINGS) IMPLANT
SUT VIC AB 3-0 SH 8-18 (SUTURE) ×1 IMPLANT
SUT VICRYL 3-0 RB1 18 ABS (SUTURE) ×2 IMPLANT
TAPE CLOTH 3X10 TAN LF (GAUZE/BANDAGES/DRESSINGS) ×1 IMPLANT
TOWEL GREEN STERILE (TOWEL DISPOSABLE) ×1 IMPLANT
TOWEL GREEN STERILE FF (TOWEL DISPOSABLE) ×1 IMPLANT
WATER STERILE IRR 1000ML POUR (IV SOLUTION) ×1 IMPLANT

## 2022-11-23 NOTE — Anesthesia Procedure Notes (Signed)
Procedure Name: Intubation Date/Time: 11/23/2022 8:48 AM  Performed by: Susy Manor, CRNAPre-anesthesia Checklist: Patient identified, Emergency Drugs available, Suction available and Patient being monitored Patient Re-evaluated:Patient Re-evaluated prior to induction Oxygen Delivery Method: Circle system utilized Preoxygenation: Pre-oxygenation with 100% oxygen Induction Type: IV induction Ventilation: Mask ventilation without difficulty Tube type: Oral Tube size: 7.5 mm Number of attempts: 1 Airway Equipment and Method: Video-laryngoscopy Placement Confirmation: ETT inserted through vocal cords under direct vision, positive ETCO2 and breath sounds checked- equal and bilateral Secured at: 23 cm Tube secured with: Tape Dental Injury: Teeth and Oropharynx as per pre-operative assessment

## 2022-11-23 NOTE — Transfer of Care (Signed)
Immediate Anesthesia Transfer of Care Note  Patient: Joseph Ortega  Procedure(s) Performed: Anterior Cervical Decompression Fusion  Cervical four-five  Patient Location: PACU  Anesthesia Type:General  Level of Consciousness: sedated  Airway & Oxygen Therapy: Patient Spontanous Breathing and Patient connected to face mask  Post-op Assessment: Report given to RN  Post vital signs: Reviewed and stable  Last Vitals:  Vitals Value Taken Time  BP 159/82 11/23/22 1049  Temp    Pulse 68 11/23/22 1052  Resp 16 11/23/22 1052  SpO2 100 % 11/23/22 1052  Vitals shown include unfiled device data.  Last Pain:  Vitals:   11/23/22 0615  TempSrc:   PainSc: 4       Patients Stated Pain Goal: 0 (11/23/22 0615)  Complications: No notable events documented.

## 2022-11-23 NOTE — H&P (Signed)
Chief Complaint   Neck pain  History of Present Illness  [name] is a 67 year old man I am have seen for initial consultation at the request of Dr.Swartz.  He is referred for evaluation of cervical disc disease.  Briefly, the patient initially suffered MCA and PCA strokes back in December 2022, and another stroke with resultant left hemi paresis in February of this year.  During his recovery has been complaining of worsening chronic neck pain with some radiation into the right greater than left shoulder.  He has known cervical disc disease from previous MRI however repeat MRI has revealed large disc herniation at C4-5 with associated spinal cord compression.  He was therefore referred for neurosurgical evaluation.  Of note, the patient reports a history of hypertension and diabetes.  He has had two previous strokes as above.  He does not report any lung, liver, or kidney disease.  He is currently maintained on an aspirin daily.  He is a nonsmoker.  Past Medical History   Past Medical History:  Diagnosis Date   Anxiety    Arthritis    Depression    Diabetes mellitus without complication (HCC)    Headache    Hypertension    Hypothyroidism    Sleep apnea    wears CPAP   Stroke (HCC)    1 stroke 2/24, and 2 strokes on 03/09/21   Thyroid disease     Past Surgical History   Past Surgical History:  Procedure Laterality Date   GROIN DEBRIDEMENT Left    ingrown hair, possibly abscesses, surgery to treat infection   HERNIA REPAIR Left    inguinal   IR ANGIO INTRA EXTRACRAN SEL COM CAROTID INNOMINATE BILAT MOD SED  03/09/2021   IR ANGIO VERTEBRAL SEL VERTEBRAL BILAT MOD SED  03/09/2021   IR US GUIDE VASC ACCESS RIGHT  03/09/2021   LOOP RECORDER INSERTION N/A 05/07/2022   Procedure: LOOP RECORDER INSERTION;  Surgeon: Lanier Prude, MD;  [facility] CV LAB;  Service: Cardiovascular;  Laterality: N/A;    Social History   Social History   Tobacco Use   Smoking  status: Never   Smokeless tobacco: Never  Vaping Use   Vaping status: Never Used  Substance Use Topics   Alcohol use: No   Drug use: No    Medications   Prior to Admission medications   Medication Sig Start Date End Date Taking? Authorizing Provider  aspirin EC 81 MG tablet Take 1 tablet (81 mg total) by mouth daily. Swallow whole. 05/08/22  Yes Mathews Argyle, NP  carvedilol (COREG) 12.5 MG tablet Take 1 tablet (12.5 mg total) by mouth 2 (two) times daily with a meal. 09/11/22  Yes Ranelle Oyster, MD  dapagliflozin propanediol (FARXIGA) 10 MG TABS tablet Take 1 tablet (10 mg total) by mouth daily. 05/23/22  Yes Angiulli, Mcarthur Rossetti, PA-C  docusate sodium (COLACE) 100 MG capsule Take 100 mg by mouth 2 (two) times daily.   Yes [provider]  Dulaglutide (TRULICITY) 3 MG/0.5ML SOPN Inject 3 mg into the skin once a week. 05/23/22  Yes Angiulli, Mcarthur Rossetti, PA-C  DULoxetine (CYMBALTA) 30 MG capsule Take 30 mg by mouth 2 (two) times daily.   Yes [provider]  gabapentin (NEURONTIN) 300 MG capsule Take 1 capsule (300 mg total) by mouth 3 (three) times daily. 10/10/22  Yes Ranelle Oyster, MD  hydrochlorothiazide (HYDRODIURIL) 12.5 MG tablet TAKE 1 TABLET BY MOUTH 2 TIMES DAILY. Patient taking differently:  Take 12.5 mg by mouth 2 (two) times daily as needed (fluid). 10/08/22  Yes Ranelle Oyster, MD  insulin aspart (NOVOLOG) 100 UNIT/ML injection Inject 0-15 Units into the skin 3 (three) times daily before meals.   Yes [provider]  insulin detemir (LEVEMIR) 100 UNIT/ML FlexPen Inject 40 Units into the skin at bedtime. Patient taking differently: Inject 40 Units into the skin every evening. 05/29/22  Yes Angiulli, Mcarthur Rossetti, PA-C  KLOR-CON M20 20 MEQ tablet Take 20 mEq by mouth daily as needed (when taking hydrochlorothiazide). 08/24/22  Yes [provider]  levothyroxine (SYNTHROID) 125 MCG tablet Take 1 tablet (125 mcg total) by mouth daily at 6 (six) AM.  05/23/22  Yes Angiulli, Mcarthur Rossetti, PA-C  methocarbamol (ROBAXIN) 500 MG tablet Take 500 mg by mouth 4 (four) times daily as needed for muscle spasms. 08/25/22  Yes [provider]  Multiple Vitamins-Minerals (ALIVE MENS GUMMY MULTIVITAMINS) CHEW Chew 3 tablets by mouth daily.   Yes [provider]  rosuvastatin (CRESTOR) 40 MG tablet Take 1 tablet (40 mg total) by mouth daily. 05/23/22  Yes Angiulli, Mcarthur Rossetti, PA-C  tiZANidine (ZANAFLEX) 2 MG tablet TAKE 1 TABLET BY MOUTH 3 TIMES DAILY. Patient taking differently: Take 2 mg by mouth every 8 (eight) hours as needed for muscle spasms. 10/18/22  Yes Ranelle Oyster, MD  topiramate (TOPAMAX) 50 MG tablet Take 1 tablet (50 mg total) by mouth daily. 05/23/22  Yes Angiulli, Mcarthur Rossetti, PA-C  traMADol (ULTRAM) 50 MG tablet TAKE 1 TABLET BY MOUTH EVERY 8 HOURS AS NEEDED. 10/18/22  Yes Ranelle Oyster, MD  acetaminophen (TYLENOL) 325 MG tablet Take 2 tablets (650 mg total) by mouth every 4 (four) hours as needed for mild pain (or temp > 37.5 C (99.5 F)). Patient not taking: Reported on 11/09/2022 05/23/22   Angiulli, Mcarthur Rossetti, PA-C  DULoxetine (CYMBALTA) 20 MG capsule Take 1 capsule (20 mg total) by mouth 2 (two) times daily. Patient not taking: Reported on 11/09/2022 05/23/22   Angiulli, Mcarthur Rossetti, PA-C  pantoprazole (PROTONIX) 40 MG tablet Take 1 tablet (40 mg total) by mouth at bedtime. Patient taking differently: Take 40 mg by mouth daily as needed (acid reflux). 05/23/22   Angiulli, Mcarthur Rossetti, PA-C    Allergies  No Known Allergies  Review of Systems  ROS  Neurologic Exam  Awake, alert, oriented Memory and concentration grossly intact Speech fluent, appropriate CN grossly intact Motor exam: Upper Extremities Deltoid Bicep Tricep Grip  Right 5/5 5/5 5/5 5/5  Left 4/5 -->      Lower Extremities IP Quad PF DF EHL  Right 5/5 5/5 5/5 5/5 5/5  Left 4/5 -->       Sensation grossly intact to LT  Imaging  MRI of the cervical spine dated  10/02/2022 was personally reviewed.  This demonstrates maintenance of cervical lordosis.  Primary finding is at C4-5 where there is a relatively large, broad-based disc bulge with associated spinal cord compression and bilateral neural foraminal stenosis.  I do not see any obvious signal change within the spinal cord.  Impression  - 67 y.o. 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 We have also reviewed in  detail the risks, benefits, and alternatives to the procedure. All questions were answered and Troye Yamashiro provided informed consent to proceed.  Lisbeth Renshaw, MD [facility]

## 2022-11-23 NOTE — Op Note (Signed)
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 Discectomy at C4-5 for decompression of spinal cord and exiting nerve roots  2. Placement of intervertebral biomechanical device Medtronic Titan 6mm lordotic medium cage 3. Placement of anterior instrumentation consisting of interbody plate and screws - Zevo 17mm plate, 15mm screws  4. Use of morselized bone allograft  5. Arthrodesis C4-5, anterior interbody technique  6. Use of intraoperative microscope  SURGEON: Dr. Lisbeth Renshaw, MD  ASSISTANT: None  ANESTHESIA: General Endotracheal  EBL: 50cc  SPECIMENS: None  DRAINS: None  COMPLICATIONS: None immediate  CONDITION: Hemodynamically stable to PACU  HISTORY: Joseph Ortega is a 67 y.o. y.o. 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 Treatment options were discussed including my recommendation for surgical decompression and fusion. Pt agreed to proceed with surgery. After all questions were answered, informed consent was obtained.  PROCEDURE IN DETAIL: The patient was brought to the operating room and transferred to the operative table. After induction of general anesthesia, the patient was positioned on the operative table in the supine position with all pressure points meticulously padded. The skin of the neck was then prepped and draped in the usual sterile fashion.  After timeout was conducted, the skin was infiltrated with local anesthetic. Right transverse skin incision was then made sharply and Bovie electrocautery was used to dissect the subcutaneous tissue until the platysma was identified. The platysma was then divided and undermined. The sternocleidomastoid muscle was then identified and, utilizing natural fascial planes in the neck, the prevertebral fascia was identified and the carotid  sheath was retracted laterally and the trachea and esophagus retracted medially. Again using fluoroscopy, the correct disc space was identified. Bovie electrocautery was used to dissect in the subperiosteal plane and elevate the bilateral longus coli muscles. Table mounted retractors were then placed. At this point, the microscope was draped and brought into the field, and the remainder of the case was done under the microscope using microdissecting technique.  The disc space was incised sharply and rongeurs were use to initially complete a discectomy. The high-speed drill was then used to complete discectomy until the posterior annulus was identified and removed and the posterior longitudinal ligament was identified. Using a nerve hook, the PLL was elevated, and Kerrison rongeurs were used to remove the posterior longitudinal ligament and the ventral thecal sac was identified. Using a combination of curettes and rongeurs, complete decompression of the thecal sac and exiting nerve roots at this level was completed, and verified using micro-nerve hook.  Having completed our decompression, attention was turned to placement of the intervertebral device. Trial spacers were used to select a 6mm medium width graft. This graft was then filled with morcellized allograft, and inserted.  After placement of the intervertebral device, the above anterior cervical plate was selected, and placed across the interspace. Using a high-speed drill, the cortex of the cervical vertebral bodies was punctured, and screws inserted in C4 and C5. Final fluoroscopic images in lateral projection was taken to confirm good hardware placement.  At this point, after all counts were verified to be correct, meticulous hemostasis was secured using a combination of bipolar electrocautery and passive hemostatics. The platysma muscle was then closed using interrupted 3-0 Vicryl sutures, and the skin was closed with an interrupted 3-0 Vicry  subcuticular stitch. Dermabond and sterile dressings were then applied and the drapes  removed.  The patient tolerated the procedure well and was extubated in the room and taken to the postanesthesia care unit in stable condition.   Lisbeth Renshaw, MD [facility]

## 2022-11-23 NOTE — Anesthesia Postprocedure Evaluation (Signed)
Anesthesia Post Note  Patient: Alize Veech  Procedure(s) Performed: Anterior Cervical Decompression Fusion  Cervical four-five     Patient location during evaluation: PACU Anesthesia Type: General Level of consciousness: awake and alert Pain management: pain level controlled Vital Signs Assessment: post-procedure vital signs reviewed and stable Respiratory status: spontaneous breathing, nonlabored ventilation, respiratory function stable and patient connected to nasal cannula oxygen Cardiovascular status: blood pressure returned to baseline and stable Postop Assessment: no apparent nausea or vomiting Anesthetic complications: no   No notable events documented.  Last Vitals:  Vitals:   11/23/22 1145 11/23/22 1221  BP: (!) 160/80 (!) 177/80  Pulse: 63 66  Resp: 12 20  Temp: 36.8 C 36.5 C  SpO2: 94% 97%    Last Pain:  Vitals:   11/23/22 1221  TempSrc: Oral  PainSc:                  Mariann Barter

## 2022-11-24 DIAGNOSIS — M4802 Spinal stenosis, cervical region: Secondary | ICD-10-CM | POA: Diagnosis not present

## 2022-11-24 LAB — GLUCOSE, CAPILLARY
Glucose-Capillary: 156 mg/dL — ABNORMAL HIGH (ref 70–99)
Glucose-Capillary: 106 mg/dL — ABNORMAL HIGH (ref 70–99)

## 2022-11-24 MED ORDER — OXYCODONE-ACETAMINOPHEN 5-325 MG PO TABS
1.0000 | ORAL_TABLET | ORAL | 0 refills | Status: DC | PRN
Start: 2022-11-24 — End: 2023-02-22

## 2022-11-24 MED ORDER — METHOCARBAMOL 500 MG PO TABS: 500.0000 mg | ORAL_TABLET | Freq: Four times a day (QID) | ORAL | 0 refills | Status: DC | PRN

## 2022-11-24 NOTE — Care Management (Signed)
Patient with order to DC to home today. Unit staff to provide DME needed for home.   No HH needs identified Patient will have family/ friends provide transportation home. No other TOC needs identified for DC 

## 2022-11-24 NOTE — Discharge Summary (Signed)
Physician Discharge Summary  Patient ID: Joseph Ortega MRN: [id number] DOB/AGE: Jul 29, 1955 67 y.o.  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 The patient tolerated the procedure well and was taken to the recovery room and then to the floor in stable condition. The hospital course was routine. There were no complications. The wound remained clean dry and intact. Pt had appropriate neck soreness. No complaints of arm pain or new N/T/W. The patient remained afebrile with stable vital signs, and tolerated a regular diet. The patient continued to increase activities, and pain was well controlled with oral pain medications.   Consults: None  Significant Diagnostic Studies:  Results for orders placed or performed during the hospital encounter of 11/23/22  Glucose, capillary  Result Value Ref Range   Glucose-Capillary 291 (H) 70 - 99 mg/dL  Glucose, capillary  Result Value Ref Range   Glucose-Capillary 270 (H) 70 - 99 mg/dL  Glucose, capillary  Result Value Ref Range   Glucose-Capillary 188 (H) 70 - 99 mg/dL  Glucose, capillary  Result Value Ref Range   Glucose-Capillary 176 (H) 70 - 99 mg/dL  Glucose, capillary  Result Value Ref Range   Glucose-Capillary 256 (H) 70 - 99 mg/dL  Glucose, capillary  Result Value Ref Range   Glucose-Capillary 242 (H) 70 - 99 mg/dL   Comment 1 Notify RN    Comment 2 Document in Chart   Glucose, capillary  Result Value Ref Range   Glucose-Capillary 156 (H) 70 - 99 mg/dL   Comment 1 Notify RN    Comment 2 Document in Chart   ABO/Rh  Result Value Ref Range   ABO/RH(D)      O POS Performed at [facility], [address] [place], [place] [address]     DG Cervical Spine 1 View  Result Date: 11/23/2022 CLINICAL  DATA:  ACDF C4-C5 EXAM: DG CERVICAL SPINE - 1 VIEW COMPARISON:  08/16/2022 FINDINGS: Two fluoroscopic images are obtained during the performance of the procedure and are provided for interpretation only. Images demonstrate instrumentation anterior to the C3-C4 disc space and ACDF at C4-C5. Fluoroscopy time: Cumulative air kerma:  mGy IMPRESSION: Intraoperative fluoroscopic guidance for ACDF C4-C5. Electronically Signed   By: Wiliam Ke M.D.   On: 11/23/2022 15:08   DG C-Arm 1-60 Min-No Report  Result Date: 11/23/2022 Fluoroscopy was utilized by the requesting physician.  No radiographic interpretation.   CUP PACEART REMOTE DEVICE CHECK  Result Date: 11/15/2022 ILR summary report received. Battery status OK. Normal device function. No new, tachy, brady, or pause episodes. No new AF episodes. Monthly summary reports and ROV/PRN 1 symptom activation, no annotation, NSR LA, CVRS   Antibiotics:  Anti-infectives (From admission, onward)    Start     Dose/Rate Route Frequency Ordered Stop   11/23/22 1700  ceFAZolin (ANCEF) IVPB 2g/100 mL premix        2 g 200 mL/hr over 30 Minutes Intravenous Every 8 hours 11/23/22 1149 11/24/22 0004   11/23/22 0700  vancomycin (VANCOCIN) IVPB 1000 mg/200 mL premix        1,000 mg 200 mL/hr over 60 Minutes Intravenous  Once 11/23/22 0648 11/23/22 0822   11/23/22 0600  ceFAZolin (ANCEF) IVPB 2g/100 mL premix        2 g  200 mL/hr over 30 Minutes Intravenous On call to O.R. 11/23/22 0547 11/23/22 0921       Discharge Exam: Blood pressure (!) 153/73, pulse 80, temperature 98.3 F (36.8 C), temperature source Oral, resp. rate 20, height 6\' 1"  (1.854 m), weight 105.2 kg, SpO2 94%. Neurologic: Grossly normal Ambulating and voiding well incision cdi   Discharge Medications:   Allergies as of 11/24/2022   No Known Allergies      Medication List     STOP taking these medications    tiZANidine 2 MG tablet Commonly known as: ZANAFLEX   traMADol 50 MG  tablet Commonly known as: ULTRAM       TAKE these medications    acetaminophen 325 MG tablet Commonly known as: TYLENOL Take 2 tablets (650 mg total) by mouth every 4 (four) hours as needed for mild pain (or temp > 37.5 C (99.5 F)).   Alive Mens Gummy Multivitamins Chew Chew 3 tablets by mouth daily.   aspirin EC 81 MG tablet Take 1 tablet (81 mg total) by mouth daily. Swallow whole.   carvedilol 12.5 MG tablet Commonly known as: COREG Take 1 tablet (12.5 mg total) by mouth 2 (two) times daily with a meal.   docusate sodium 100 MG capsule Commonly known as: COLACE Take 100 mg by mouth 2 (two) times daily.   DULoxetine 30 MG capsule Commonly known as: CYMBALTA Take 30 mg by mouth 2 (two) times daily.   DULoxetine 20 MG capsule Commonly known as: CYMBALTA Take 1 capsule (20 mg total) by mouth 2 (two) times daily.   Farxiga 10 MG Tabs tablet Generic drug: dapagliflozin propanediol Take 1 tablet (10 mg total) by mouth daily.   gabapentin 300 MG capsule Commonly known as: Neurontin Take 1 capsule (300 mg total) by mouth 3 (three) times daily.   hydrochlorothiazide 12.5 MG tablet Commonly known as: HYDRODIURIL TAKE 1 TABLET BY MOUTH 2 TIMES DAILY. What changed:  when to take this reasons to take this   insulin aspart 100 UNIT/ML injection Commonly known as: novoLOG Inject 0-15 Units into the skin 3 (three) times daily before meals.   insulin detemir 100 UNIT/ML FlexPen Commonly known as: LEVEMIR Inject 40 Units into the skin at bedtime. What changed: when to take this   Klor-Con M20 20 MEQ tablet Generic drug: potassium chloride SA Take 20 mEq by mouth daily as needed (when taking hydrochlorothiazide).   levothyroxine 125 MCG tablet Commonly known as: SYNTHROID Take 1 tablet (125 mcg total) by mouth daily at 6 (six) AM.   methocarbamol 500 MG tablet Commonly known as: ROBAXIN Take 1 tablet (500 mg total) by mouth 4 (four) times daily as needed for muscle  spasms.   oxyCODONE-acetaminophen 5-325 MG tablet Commonly known as: PERCOCET/ROXICET Take 1 tablet by mouth every 4 (four) hours as needed for severe pain.   pantoprazole 40 MG tablet Commonly known as: PROTONIX Take 1 tablet (40 mg total) by mouth at bedtime. What changed:  when to take this reasons to take this   rosuvastatin 40 MG tablet Commonly known as: CRESTOR Take 1 tablet (40 mg total) by mouth daily.   topiramate 50 MG tablet Commonly known as: TOPAMAX Take 1 tablet (50 mg total) by mouth daily.   Trulicity 3 MG/0.5ML Sopn Generic drug: Dulaglutide Inject 3 mg into the skin once a week.        Disposition: home   Final Dx: acdf C4-5  Discharge Instructions      Remove dressing  in 72 hours   Complete by: As directed    Call MD for:  difficulty breathing, headache or visual disturbances   Complete by: As directed    Call MD for:  hives   Complete by: As directed    Call MD for:  persistant dizziness or light-headedness   Complete by: As directed    Call MD for:  persistant nausea and vomiting   Complete by: As directed    Call MD for:  redness, tenderness, or signs of infection (pain, swelling, redness, odor or green/yellow discharge around incision site)   Complete by: As directed    Call MD for:  severe uncontrolled pain   Complete by: As directed    Call MD for:  temperature >100.4   Complete by: As directed    Diet - low sodium heart healthy   Complete by: As directed    Driving Restrictions   Complete by: As directed    No driving for 2 weeks, no riding in the car for 1 week   Incentive spirometry RT   Complete by: As directed    Increase activity slowly   Complete by: As directed    Lifting restrictions   Complete by: As directed    No lifting more than 8 lbs          Signed: Tiana Loft Mackinsey Pelland 11/24/2022, 8:39 AM

## 2022-11-24 NOTE — Progress Notes (Signed)
Orthopedic Tech Progress Note Patient Details:  Joseph Ortega Dec 20, 1955 [id number]  Ortho Devices Type of Ortho Device: Soft collar Ortho Device/Splint Interventions: Application   Post Interventions Patient Tolerated: Well  Genelle Bal Mariaha Ellington 11/24/2022, 9:13 AM

## 2022-11-24 NOTE — Progress Notes (Signed)
Patient alert and oriented, mae's well, voiding adequate amount of urine, swallowing without difficulty, no c/o pain at time of discharge, Patient stated he has knee pain and discomfort prior to surgery and still present. Patient discharged home. Script and discharged instructions given to patient. Patient stated understanding of instructions given. Patient has an appointment with Dr. Conchita Paris

## 2022-11-24 NOTE — Evaluation (Signed)
Physical Therapy Evaluation Patient Details Name: Joseph Ortega MRN: [id number] DOB: Mar 26, 1955 Today's Date: 11/24/2022  History of Present Illness  Joseph Ortega is a 67 y/o male admitted under observation 11/23/22 for planned ACDF C 4-5. PMH includes Anxiety, Arthritis, Depression, Diabetes mellitus, Headache, HTN, Hypothyroidism, Sleep apnea, Stroke (left hemi).  Clinical Impression   Pt presents with chronic L hemiparesis, impaired balance, decreased knowledge and application of spinal precautions, impaired activity tolerance. PT anticipates pt's balance impairment is baseline, but uses RW well for balance. Pt ambulated hallway dsitance with RW and tolerated stair training well, complaining only minimally of neck pain. Pt plans to d/c home today, will have assist from wife for ADLs, pt with no further questions or acute PT needs.        If plan is discharge home, recommend the following: A little help with walking and/or transfers;A little help with bathing/dressing/bathroom   Can travel by private vehicle        Equipment Recommendations None recommended by PT  Recommendations for Other Services       Functional Status Assessment Patient has had a recent decline in their functional status and demonstrates the ability to make significant improvements in function in a reasonable and predictable amount of time.     Precautions / Restrictions Precautions Precautions: Cervical Precaution Booklet Issued: Yes (comment) Required Braces or Orthoses: Cervical Brace Cervical Brace: Soft collar      Mobility  Bed Mobility Overal bed mobility: Needs Assistance Bed Mobility: Supine to Sit, Sit to Supine     Supine to sit: Supervision Sit to supine: Supervision   General bed mobility comments: for safety, min cues for log roll technique    Transfers Overall transfer level: Needs assistance Equipment used: Rolling walker (2 wheels) Transfers: Sit to/from Stand Sit to Stand: Contact  guard assist           General transfer comment: for safety    Ambulation/Gait Ambulation/Gait assistance: Supervision Gait Distance (Feet): 80 Feet Assistive device: Rolling walker (2 wheels) Gait Pattern/deviations: Step-to pattern, Decreased stance time - left, Decreased step length - left, Trunk flexed Gait velocity: decr     General Gait Details: cues for safe RW use, precautions  Stairs Stairs: Yes Stairs assistance: Contact guard assist Stair Management: One rail Right, With walker, Step to pattern Number of Stairs: 2 General stair comments: uses RW in one hand to push up onto first step (does this at home), other hand on R rail. Pt assumes step-to pattern, descends steps backwards. Increased time, suspect all baselien  Wheelchair Mobility     Tilt Bed    Modified Rankin (Stroke Patients Only)       Balance Overall balance assessment: Mild deficits observed, not formally tested                                           Pertinent Vitals/Pain Pain Assessment Pain Assessment: Faces Faces Pain Scale: Hurts a little bit Pain Location: neck, incision site Pain Descriptors / Indicators: Discomfort Pain Intervention(s): Limited activity within patient's tolerance, Monitored during session, Repositioned    Home Living Family/patient expects to be discharged to:: Private residence Living Arrangements: Spouse/significant other (wife in a wheelchair) Available Help at Discharge: Family;Available 24 hours/day Type of Home: House Home Access: Ramped entrance;Stairs to enter   Entrance Stairs-Number of Steps: ramp into the house then 2 steps with grab  bar   Home Layout: One level Home Equipment: Agricultural consultant (2 wheels);Shower seat;Grab bars - tub/shower;Grab bars - toilet;Rollator (4 wheels);Adaptive equipment      Prior Function Prior Level of Function : Needs assist             Mobility Comments: had to stop OPPT due to neck pain,  walks with RW and quad cane ADLs Comments: Wife provides stand by assist for shower and sometime assist for LB. Does not provide physical hands on assist. pt's wife prepares meals     Extremity/Trunk Assessment   Upper Extremity Assessment Upper Extremity Assessment: Defer to OT evaluation    Lower Extremity Assessment Lower Extremity Assessment: LLE deficits/detail LLE Deficits / Details: history of cva, min foot drop and difficulty with LLE clearance    Cervical / Trunk Assessment Cervical / Trunk Assessment: Neck Surgery  Communication   Communication Communication: No apparent difficulties  Cognition Arousal: Alert Behavior During Therapy: WFL for tasks assessed/performed Overall Cognitive Status: Within Functional Limits for tasks assessed                                          General Comments      Exercises Other Exercises Other Exercises: Home walking program: up and walking 1x/hour during waking hours for short household distances with supervision of family, to promote circulation, activity tolerance, and strength maintenance.   Assessment/Plan    PT Assessment All further PT needs can be met in the next venue of care  PT Problem List Decreased strength;Decreased mobility;Decreased activity tolerance;Decreased balance;Decreased knowledge of use of DME;Pain;Cardiopulmonary status limiting activity;Decreased safety awareness;Decreased coordination       PT Treatment Interventions      PT Goals (Current goals can be found in the Care Plan section)  Acute Rehab PT Goals PT Goal Formulation: With patient Time For Goal Achievement: 11/24/22 Potential to Achieve Goals: Good    Frequency       Co-evaluation               AM-PAC PT "6 Clicks" Mobility  Outcome Measure Help needed turning from your back to your side while in a flat bed without using bedrails?: A Little Help needed moving from lying on your back to sitting on the side of  a flat bed without using bedrails?: A Little Help needed moving to and from a bed to a chair (including a wheelchair)?: A Little Help needed standing up from a chair using your arms (e.g., wheelchair or bedside chair)?: A Little Help needed to walk in hospital room?: A Little Help needed climbing 3-5 steps with a railing? : A Little 6 Click Score: 18    End of Session Equipment Utilized During Treatment: Cervical collar Activity Tolerance: Patient tolerated treatment well Patient left: in bed;with call bell/phone within reach Nurse Communication: Mobility status PT Visit Diagnosis: Other abnormalities of gait and mobility (R26.89)    Time: 1032-1050 PT Time Calculation (min) (ACUTE ONLY): 18 min   Charges:   PT Evaluation $PT Eval Low Complexity: 1 Low   PT General Charges $$ ACUTE PT VISIT: 1 Visit         Marye Round, PT DPT Acute Rehabilitation Services Secure Chat Preferred  Office [phone]   Anneke Cundy E Christain [place] 11/24/2022, 2:58 PM

## 2022-11-24 NOTE — Evaluation (Signed)
Occupational Therapy Evaluation and Discharge Patient Details Name: Joseph Ortega MRN: [id number] DOB: Apr 23, 1955 Today's Date: 11/24/2022   History of Present Illness Joseph Ortega is a 67 y/o male admitted under observation 11/23/22 for planned ACDF C 4-5. PMH includes Anxiety, Arthritis, Depression, Diabetes mellitus, Headache, HTN, Hypothyroidism, Sleep apnea, Stroke (left hemi).   Clinical Impression   Pt typically mod I for ADL with DME and AE, ambulates with a RW or quad cane (prefers RW). Wife is in a WC so limited ability to assist. Pt was doing OPPT but had to stop for pain, is hopeful to resume quickly. Reviewed cervical precautions handout and reviewed focusing on compensatory strategies for ADL/IADL. Today he needed extra assist for LB clothing due to L knee pain, educated on using grabber/reacher to assist getting to LB.  Pt able to maintain standing with SUE for grooming tasks. Pt education completed and Pt with no further questions or concerns at this time. Pt declined HHOT despite new challenges (pre-existing a few days before sx) in LLE and new cervical precautions.       If plan is discharge home, recommend the following: A little help with bathing/dressing/bathroom;Assistance with cooking/housework;Assist for transportation    Functional Status Assessment  Patient has had a recent decline in their functional status and demonstrates the ability to make significant improvements in function in a reasonable and predictable amount of time.  Equipment Recommendations  None recommended by OT (Pt has appropriate DME)    Recommendations for Other Services PT consult     Precautions / Restrictions Precautions Precautions: Cervical Precaution Booklet Issued: Yes (comment) Required Braces or Orthoses: Cervical Brace Cervical Brace: Soft collar      Mobility Bed Mobility Overal bed mobility: Needs Assistance Bed Mobility: Supine to Sit, Sit to Supine     Supine to sit:  Supervision, Used rails Sit to supine: Supervision   General bed mobility comments: unable to perform rolling and sidelying to sit due to hemiplegia    Transfers Overall transfer level: Needs assistance Equipment used: Rolling walker (2 wheels) Transfers: Sit to/from Stand Sit to Stand: Contact guard assist           General transfer comment: elevated bed, extra time, but powers up on his own, guard for safety      Balance Overall balance assessment: Mild deficits observed, not formally tested                                         ADL either performed or assessed with clinical judgement   ADL Overall ADL's : Needs assistance/impaired Eating/Feeding: Independent   Grooming: Supervision/safety;Standing;Cueing for safety;Cueing for compensatory techniques Grooming Details (indicate cue type and reason): educated in compensatory strategies like cup method Upper Body Bathing: Supervision/ safety;Sitting Upper Body Bathing Details (indicate cue type and reason): uses bench at baseline Lower Body Bathing: Supervison/ safety;Sitting/lateral leans;Cueing for compensatory techniques;With adaptive equipment Lower Body Bathing Details (indicate cue type and reason): educated in long handle sponge, typically bathes sitting Upper Body Dressing : Contact guard assist;Sitting Upper Body Dressing Details (indicate cue type and reason): including shirt and neck brace Lower Body Dressing: Minimal assistance;Sit to/from stand Lower Body Dressing Details (indicate cue type and reason): wife able to assist at home, needing a little more assist due to L knee pain. Educated him to use Scientific laboratory technician to Engineer, manufacturing: Contact guard assist;Ambulation;Rolling walker (2 wheels)  Toileting- Clothing Manipulation and Hygiene: Contact guard assist;Sitting/lateral lean   Tub/ Shower Transfer: Tub bench   Functional mobility during ADLs: Contact guard assist;Rolling walker  (2 wheels) General ADL Comments: L knee painful and presenting more problems than cervical precautions. Pt has AE at home and will plan on using to assist - in addition to wife assist     Vision Baseline Vision/History: 1 Wears glasses Ability to See in Adequate Light: 0 Adequate Patient Visual Report: No change from baseline       Perception         Praxis         Pertinent Vitals/Pain Pain Assessment Pain Assessment: Faces Faces Pain Scale: Hurts little more Pain Location: neck, incision site Pain Descriptors / Indicators: Discomfort Pain Intervention(s): Monitored during session, Repositioned     Extremity/Trunk Assessment Upper Extremity Assessment Upper Extremity Assessment: Right hand dominant (hemiparetic from previous CVA, no changes)   Lower Extremity Assessment Lower Extremity Assessment: Defer to PT evaluation   Cervical / Trunk Assessment Cervical / Trunk Assessment: Neck Surgery   Communication Communication Communication: No apparent difficulties Cueing Techniques: Verbal cues   Cognition Arousal: Alert Behavior During Therapy: WFL for tasks assessed/performed Overall Cognitive Status: Within Functional Limits for tasks assessed                                       General Comments       Exercises     Shoulder Instructions      Home Living Family/patient expects to be discharged to:: Private residence Living Arrangements: Spouse/significant other (wife in a wheelchair) Available Help at Discharge: Family;Available 24 hours/day Type of Home: House Home Access: Ramped entrance;Stairs to enter Entrance Stairs-Number of Steps: ramp into the house then 2 steps with grab bar   Home Layout: One level     Bathroom Shower/Tub: Producer, television/film/video: Handicapped height Bathroom Accessibility: Yes How Accessible: Accessible via wheelchair;Accessible via walker Home Equipment: Rolling Walker (2 wheels);Shower seat;Grab  bars - tub/shower;Grab bars - toilet;Rollator (4 wheels);Adaptive equipment Adaptive Equipment: Reacher;Long-handled sponge        Prior Functioning/Environment Prior Level of Function : Needs assist;Independent/Modified Independent             Mobility Comments: had to stop OPPT due to neck pain, walks with RW and quad cane ADLs Comments: Wife provides stand by assist for shower and sometime assist for LB. Does not provide physical hands on assist.        OT Problem List: Decreased range of motion;Impaired balance (sitting and/or standing);Decreased safety awareness;Decreased knowledge of use of DME or AE;Decreased knowledge of precautions;Pain      OT Treatment/Interventions:      OT Goals(Current goals can be found in the care plan section) Acute Rehab OT Goals Patient Stated Goal: get back to outpatient PT OT Goal Formulation: With patient Time For Goal Achievement: 12/08/22 Potential to Achieve Goals: Good  OT Frequency:      Co-evaluation              AM-PAC OT "6 Clicks" Daily Activity     Outcome Measure Help from another person eating meals?: None Help from another person taking care of personal grooming?: A Little Help from another person toileting, which includes using toliet, bedpan, or urinal?: A Little Help from another person bathing (including washing, rinsing, drying)?: A Little Help from another  person to put on and taking off regular upper body clothing?: A Little Help from another person to put on and taking off regular lower body clothing?: A Little 6 Click Score: 19   End of Session Equipment Utilized During Treatment: Cervical collar;Rolling walker (2 wheels) Nurse Communication: Mobility status;Precautions  Activity Tolerance: Patient tolerated treatment well Patient left: in bed;with call bell/phone within reach  OT Visit Diagnosis: Other abnormalities of gait and mobility (R26.89);Muscle weakness (generalized) (M62.81)                 Time: 9629-5284 OT Time Calculation (min): 27 min Charges:  OT General Charges $OT Visit: 1 Visit OT Evaluation $OT Eval Moderate Complexity: 1 Mod  Nyoka Cowden OTR/L Acute Rehabilitation Services Office: [phone]  Emelda Fear 11/24/2022, 9:59 AM

## 2022-11-25 ENCOUNTER — Encounter (HOSPITAL_COMMUNITY): Payer: Self-pay | Admitting: Neurosurgery

## 2022-11-29 ENCOUNTER — Ambulatory Visit: Payer: Medicare HMO | Admitting: Physical Therapy

## 2022-11-29 ENCOUNTER — Encounter: Payer: Medicare HMO | Admitting: Occupational Therapy

## 2022-12-04 ENCOUNTER — Ambulatory Visit: Payer: Medicare HMO | Admitting: Podiatry

## 2022-12-06 ENCOUNTER — Ambulatory Visit: Payer: Medicare HMO | Admitting: Podiatry

## 2022-12-06 ENCOUNTER — Encounter: Payer: Medicare HMO | Admitting: Occupational Therapy

## 2022-12-06 ENCOUNTER — Ambulatory Visit: Payer: Medicare HMO | Admitting: Physical Therapy

## 2022-12-12 ENCOUNTER — Encounter: Payer: Medicare HMO | Admitting: Physical Medicine & Rehabilitation

## 2022-12-13 ENCOUNTER — Ambulatory Visit: Payer: Medicare HMO | Admitting: Physical Therapy

## 2022-12-13 ENCOUNTER — Encounter: Payer: Medicare HMO | Admitting: Occupational Therapy

## 2022-12-17 ENCOUNTER — Ambulatory Visit (INDEPENDENT_AMBULATORY_CARE_PROVIDER_SITE_OTHER): Payer: Medicare HMO

## 2022-12-17 DIAGNOSIS — I639 Cerebral infarction, unspecified: Secondary | ICD-10-CM

## 2022-12-17 LAB — CUP PACEART REMOTE DEVICE CHECK
Date Time Interrogation Session: 20240929020605
Implantable Pulse Generator Implant Date: 20240219
Pulse Gen Serial Number: 511022381

## 2022-12-19 ENCOUNTER — Ambulatory Visit: Payer: Medicare HMO

## 2022-12-20 ENCOUNTER — Other Ambulatory Visit (HOSPITAL_COMMUNITY): Payer: Self-pay

## 2022-12-20 ENCOUNTER — Ambulatory Visit: Payer: Medicare HMO | Admitting: Physical Therapy

## 2022-12-20 ENCOUNTER — Ambulatory Visit: Payer: Medicare HMO | Attending: Physical Medicine & Rehabilitation | Admitting: Physical Therapy

## 2022-12-20 ENCOUNTER — Encounter: Payer: Medicare HMO | Admitting: Occupational Therapy

## 2022-12-20 VITALS — BP 146/63 | HR 58

## 2022-12-20 DIAGNOSIS — R278 Other lack of coordination: Secondary | ICD-10-CM | POA: Diagnosis present

## 2022-12-20 DIAGNOSIS — I69354 Hemiplegia and hemiparesis following cerebral infarction affecting left non-dominant side: Secondary | ICD-10-CM | POA: Diagnosis present

## 2022-12-20 DIAGNOSIS — R2681 Unsteadiness on feet: Secondary | ICD-10-CM | POA: Insufficient documentation

## 2022-12-20 DIAGNOSIS — M542 Cervicalgia: Secondary | ICD-10-CM | POA: Diagnosis present

## 2022-12-20 DIAGNOSIS — M6281 Muscle weakness (generalized): Secondary | ICD-10-CM | POA: Diagnosis present

## 2022-12-20 DIAGNOSIS — R2689 Other abnormalities of gait and mobility: Secondary | ICD-10-CM | POA: Insufficient documentation

## 2022-12-20 NOTE — Therapy (Signed)
OUTPATIENT PHYSICAL THERAPY CERVICAL EVALUATION   Patient Name: Joseph Ortega MRN: [id number] DOB:June 23, 1955, 67 y.o., male Today's Date: 12/20/2022  END OF SESSION:  PT End of Session - 12/20/22 1203     Visit Number 1    Number of Visits 9   with eval   Date for PT Re-Evaluation 02/28/23    Authorization Type [employer]    PT Start Time 1200    PT Stop Time 1233   eval   PT Time Calculation (min) 33 min    Activity Tolerance Patient tolerated treatment well    Behavior During Therapy WFL for tasks assessed/performed             Past Medical History:  Diagnosis Date   Anxiety    Arthritis    Depression    Diabetes mellitus without complication (HCC)    Headache    Hypertension    Hypothyroidism    Sleep apnea    wears CPAP   Stroke (HCC)    1 stroke 2/24, and 2 strokes on 03/09/21   Thyroid disease    Past Surgical History:  Procedure Laterality Date   ANTERIOR CERVICAL DECOMP/DISCECTOMY FUSION N/A 11/23/2022   Procedure: Anterior Cervical Decompression Fusion  Cervical four-five;  Surgeon: Lisbeth Renshaw, MD;  Location: [facility] OR;  Service: Neurosurgery;  Laterality: N/A;   GROIN DEBRIDEMENT Left    ingrown hair, possibly abscesses, surgery to treat infection   HERNIA REPAIR Left    inguinal   IR ANGIO INTRA EXTRACRAN SEL COM CAROTID INNOMINATE BILAT MOD SED  03/09/2021   IR ANGIO VERTEBRAL SEL VERTEBRAL BILAT MOD SED  03/09/2021   IR US GUIDE VASC ACCESS RIGHT  03/09/2021   LOOP RECORDER INSERTION N/A 05/07/2022   Procedure: LOOP RECORDER INSERTION;  Surgeon: Lanier Prude, MD;  Location: [facility] INVASIVE [facility];  Service: Cardiovascular;  Laterality: N/A;   Patient Active Problem List   Diagnosis Date Noted   Cervical stenosis of spine 11/23/2022   Cervicalgia 08/15/2022   Hyperlipidemia 05/07/2022   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   Stroke (cerebrum) (HCC) 05/02/2022   ICH (intracerebral hemorrhage) (HCC) 03/17/2021    Hypothyroidism 03/12/2021   OSA (obstructive sleep apnea) 03/12/2021   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   Uncontrolled type 2 diabetes mellitus with hyperglycemia (HCC) 03/12/2021   Redness of both eyes 03/12/2021   Morbid obesity (HCC) 03/11/2021   Essential hypertension 03/11/2021   Tobacco abuse 03/11/2021   Herpes zoster with complication    SAH (subarachnoid hemorrhage) (HCC) 03/08/2021    PCP: None at this time, needs info to get setup with a new one  REFERRING PROVIDER: Lisbeth Renshaw, MD  REFERRING DIAG: [phone] (ICD-10-CM) - Spinal stenosis, cervical region  THERAPY DIAG:  Muscle weakness (generalized)  Hemiplegia and hemiparesis following cerebral infarction affecting left non-dominant side (HCC)  Unsteadiness on feet  Other abnormalities of gait and mobility  Rationale for Evaluation and Treatment: Rehabilitation  ONSET DATE: 12/10/2022  SUBJECTIVE:  SUBJECTIVE STATEMENT: Pt familiar to this clinic as he has been seen previously following his two CVAs, presents today s/p ACDF C4-5 11/23/22.  Pt reports that he did just have a follow-up with his surgeon yesterday, he reports he has been cleared to not have to wear his soft collar and no longer has any cervical precautions in terms of ROM. Pt reports he is limited to lifting about 20-25# but that he does also have to continue to load/unload his wife's transport wheelchair from his truck and was told "you gotta do what you gotta do" as long as he is not lifting overhead.   Pt reports his balance improved since surgery, still with ongoing L hemibody impairments 2/2 CVA.   Hand dominance: Right  PERTINENT HISTORY:  PMH includes Anxiety, Arthritis, Depression, Diabetes mellitus, Headache, HTN, Hypothyroidism, Sleep apnea,  Stroke (left hemi).  PAIN:  Are you having pain? Yes: NPRS scale: 3/10 Pain location: back of neck Pain description: throbbing Aggravating factors: N/A Relieving factors: pain medication  PRECAUTIONS: Fall and Other: cervical  No ROM restrictions and no brace per patient; does have 20-25# lifting restriction and no lifting overhead   RED FLAGS: None     WEIGHT BEARING RESTRICTIONS: No  FALLS:  Has patient fallen in last 6 months? No  LIVING ENVIRONMENT: Lives with: lives with their family wife Lives in: House/apartment Stairs: Yes: Internal: 2 steps; none no issues with stairs Has following equipment at home: Dan Humphreys - 2 wheeled and [employer] - 4 wheeled  OCCUPATION: retired/disability  PLOF: Independent with gait, Independent with transfers, and Requires assistive device for independence  PATIENT GOALS: "get more independent"  NEXT MD VISIT: sees Dr. Conchita Paris again 10/28  OBJECTIVE:  Note: Objective measures were completed at Evaluation unless otherwise noted.  DIAGNOSTIC FINDINGS:  MRI Cervical Spine 09/02/22 prior to surgery on 11/23/22 IMPRESSION: 1. At C4-5 there is a broad-based disc bulge with a broad central disc protrusion with mass effect on the ventral cervical spinal cord. Severe spinal stenosis. Bilateral uncovertebral degenerative changes. Severe bilateral foraminal stenosis. 2. Cervical spine spondylosis as described above. 3. No acute osseous injury of the cervical spine.   COGNITION: Overall cognitive status: Within functional limits for tasks assessed  SENSATION: WFL UE and LE  POSTURE: rounded shoulders and forward head   EDEMA: Pitting edema in LLE, pt reports MD is aware and he is on fluid pills; unable to don TED hose due to physical impairments and wife unable to physically assist  CERVICAL ROM: not assessed at eval  Active ROM A/PROM (deg) eval  Flexion   Extension   Right lateral flexion   Left lateral flexion   Right rotation    Left rotation    (Blank rows = not tested)   UPPER EXTREMITY MMT:  MMT Right eval Left eval  Shoulder flexion 5 Not assessed, will defer to OT eval  Shoulder extension    Shoulder abduction 5   Shoulder adduction    Shoulder extension    Shoulder internal rotation    Shoulder external rotation    Middle trapezius    Lower trapezius    Elbow flexion 5   Elbow extension 5   Wrist flexion    Wrist extension    Wrist ulnar deviation    Wrist radial deviation    Wrist pronation    Wrist supination    Grip strength WFL    (Blank rows = not tested)   LOWER EXTREMITY MMT:    MMT Right  eval Left eval  Hip flexion 5 3  Hip extension    Hip abduction    Hip adduction    Hip internal rotation    Hip external rotation    Knee flexion 5 3  Knee extension 5 4  Ankle dorsiflexion 3 2-  Ankle plantarflexion    Ankle inversion    Ankle eversion     (Blank rows = not tested)    FUNCTIONAL TESTS:    [facility] PT Assessment - 12/23/22 0001       Ambulation/Gait   Gait velocity 32.8 ft over 24.25 sec = 1.35 ft/sec      Standardized Balance Assessment   Standardized Balance Assessment Timed Up and Go Test;Five Times Sit to Stand    Five times sit to stand comments  17.22 sec   with RUE on arm of chair, SBA     Timed Up and Go Test   TUG Normal TUG    Normal TUG (seconds) 31.28   with rollator             TODAY'S TREATMENT:                                                                                                                               BP assessed in sitting prior to initiation of assessment:  Vitals:   12/20/22 1213  BP: (!) 146/63  Pulse: (!) 58     PATIENT EDUCATION:  Education details: Eval findings, PT POC, results of OM and functional implications and as compared to previous scores Person educated: Patient Education method: Explanation and Demonstration Education comprehension: verbalized understanding, returned demonstration, and needs  further education  HOME EXERCISE PROGRAM: To be reviewed from previous POC and revised as appropriate [id number]    ASSESSMENT:  CLINICAL IMPRESSION: Patient is a 67 year old male referred to Neuro OPPT for cervical spinal stenosis and s/p C4-5 ACDF with ongoing residual L hemibody weakness from prior CVA x 2.   Pt's PMH is significant for: Anxiety, Arthritis, Depression, Diabetes mellitus, Headache, HTN, Hypothyroidism, Sleep apnea, Stroke (left hemi). The following deficits were present during the exam: decreased LLE strength, impaired functional strength, impaired endurance, impaired balance. Based on his gait speed of 1.35 ft/sec, TUG score of 31.28 sec, and L hemibody weakness, pt is an increased risk for falls. Pt would benefit from skilled PT to address these impairments and functional limitations to maximize functional mobility independence.   OBJECTIVE IMPAIRMENTS: Abnormal gait, decreased activity tolerance, decreased balance, decreased coordination, decreased endurance, decreased mobility, difficulty walking, decreased ROM, decreased strength, impaired perceived functional ability, impaired UE functional use, postural dysfunction, and pain.   ACTIVITY LIMITATIONS: carrying, lifting, bending, standing, squatting, stairs, and transfers  PARTICIPATION LIMITATIONS: meal prep, cleaning, laundry, interpersonal relationship, driving, and community activity  PERSONAL FACTORS: Time since onset of injury/illness/exacerbation, Transportation, and 3+ comorbidities:     Anxiety, Arthritis, Depression, Diabetes mellitus, Headache, HTN, Hypothyroidism, Sleep apnea, Stroke (left  hemi).are also affecting patient's functional outcome.   REHAB POTENTIAL: Good  CLINICAL DECISION MAKING: Stable/uncomplicated  EVALUATION COMPLEXITY: Moderate   GOALS: Goals reviewed with patient? Yes  SHORT TERM GOALS: Target date: 01/20/2023   Pt will be independent with initial review of HEP for improved  strength, balance, transfers and gait. Baseline:  Goal status: INITIAL  2.  Pt will improve normal TUG to less than or equal to 25 seconds for improved functional mobility and decreased fall risk. Baseline: 31.28 sec with rollator (10/3) Goal status: INITIAL  3.  Pt will improve gait velocity to at least 1.5 ft/sec for improved gait efficiency and performance at mod I level   Baseline: 1.35 ft/sec with rollator mod I (10/3) Goal status: INITIAL  4.  Berg to be assessed and STG set Baseline:  Goal status: INITIAL   LONG TERM GOALS: Target date: 02/17/2023   Pt will be independent with final review of HEP for improved strength, balance, transfers and gait. Baseline:  Goal status: INITIAL  2.  Pt will improve 5 x STS to less than or equal to 15 seconds to demonstrate improved functional strength and transfer efficiency.  Baseline: 17.22 RUE (10/3) Goal status: INITIAL  3.  Pt will improve normal TUG to less than or equal to 20 seconds for improved functional mobility and decreased fall risk. Baseline: 31.28 sec with rollator (10/3) Goal status: INITIAL  4.  Pt will improve gait velocity to at least 1.75 ft/sec for improved gait efficiency and performance at mod I level   Baseline: 1.35 ft/sec with rollator mod I (10/3) Goal status: INITIAL  5.  Berg to be assessed and LTG set Baseline:  Goal status: INITIAL     PLAN:  PT FREQUENCY: 1x/week  PT DURATION:  8 sessions  PLANNED INTERVENTIONS: Therapeutic exercises, Therapeutic activity, Neuromuscular re-education, Balance training, Gait training, Patient/Family education, Self Care, Joint mobilization, Stair training, Orthotic/Fit training, DME instructions, Dry Needling, Electrical stimulation, Cryotherapy, Moist heat, Taping, Manual therapy, and Re-evaluation  PLAN FOR NEXT SESSION: print out info for new PCP, assess Berg and write STG/LTG, review prior HEP and revise as appropriate, LLE NMR, did we get OT  referral?   Peter Congo, PT, DPT, CSRS  12/20/2022, 12:33 PM

## 2022-12-23 IMAGING — CT CT ANGIO HEAD
3 of 12 series · 9 of 47 positions shown · IV contrast (Omni 300)
Comparison: Head CT 03/10/2021

CLINICAL DATA: Stroke follow-up

EXAM:
CT ANGIOGRAPHY HEAD
TECHNIQUE: Multidetector CT imaging of the head was performed using the
standard protocol during bolus administration of intravenous
contrast. Multiplanar CT image reconstructions and MIPs were
obtained to evaluate the vascular anatomy.
CONTRAST:  74mL OMNIPAQUE IOHEXOL 350 MG/ML SOLN

[Series 9: sagittal · sagittal · 0.32mm/px · 1 of 57 slices shown]
[im 6/57  brain]
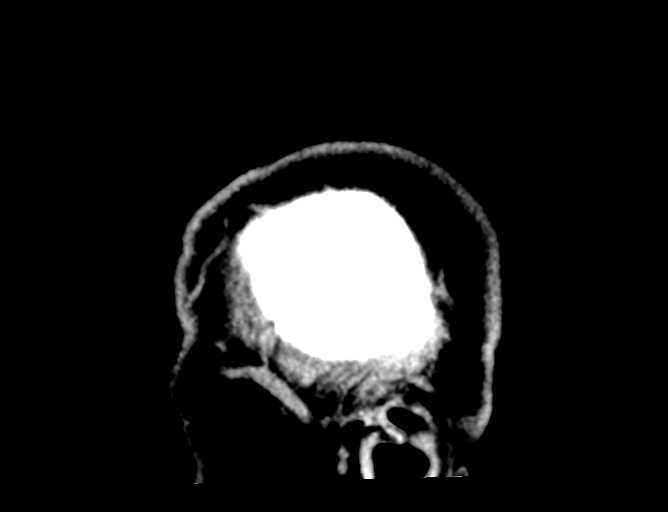

[Series 11: ax head bone · axial · 0.36mm/px · z∈[+1321,+1414]mm · 4 of 81 slices shown]
[im 17/81  bone]
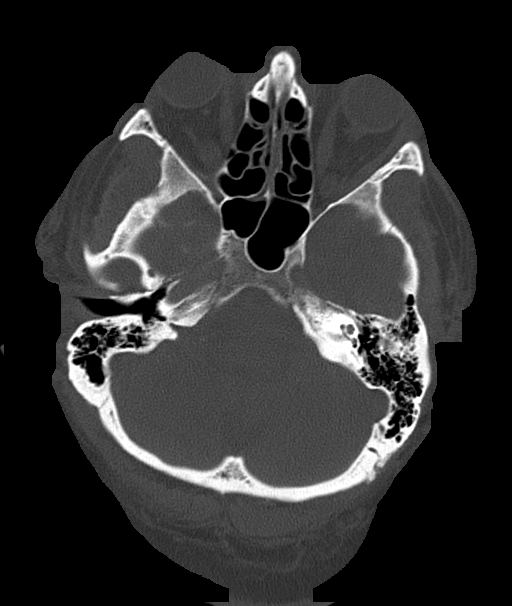
[im 33/81  bone]
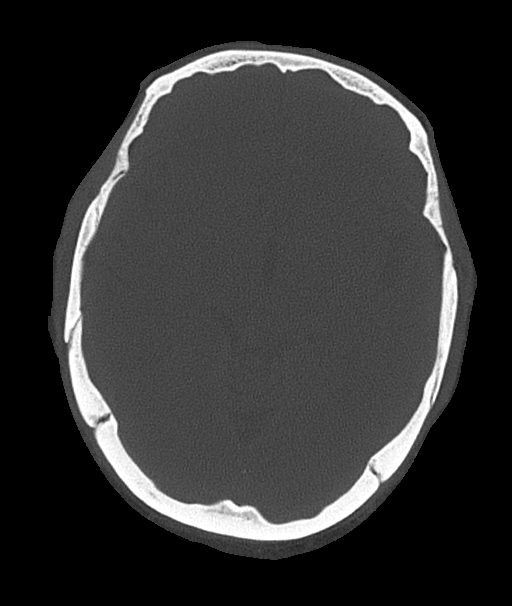
[im 49/81  bone]
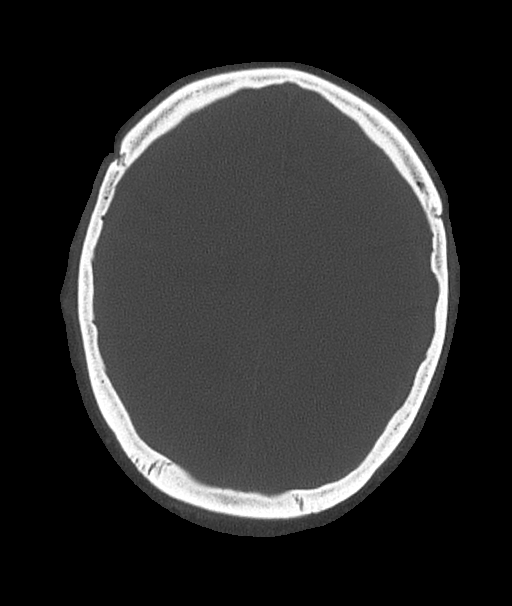
[im 65/81  bone]
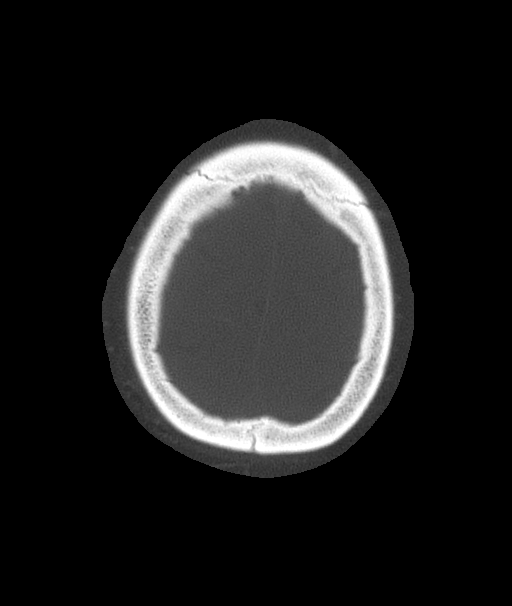

[Series 12: cow 2.0 · axial · 0.52mm/px · z∈[+1286,+1376]mm · 4 of 77 slices shown]
[im 16/77  brain]
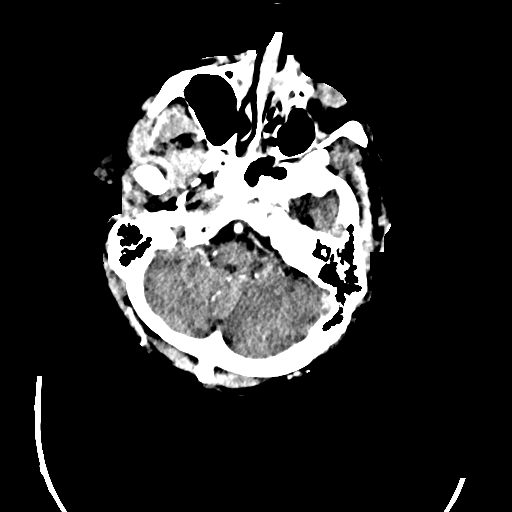
[im 31/77  bone]
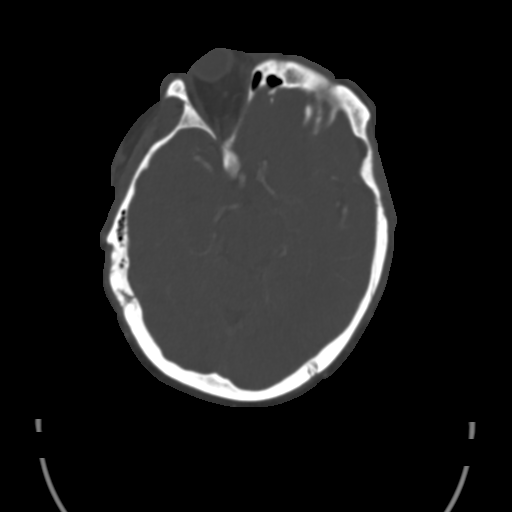
[im 46/77  brain]
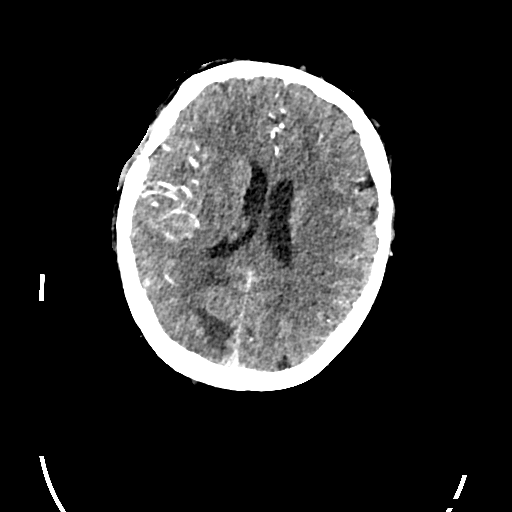
[im 61/77  bone]
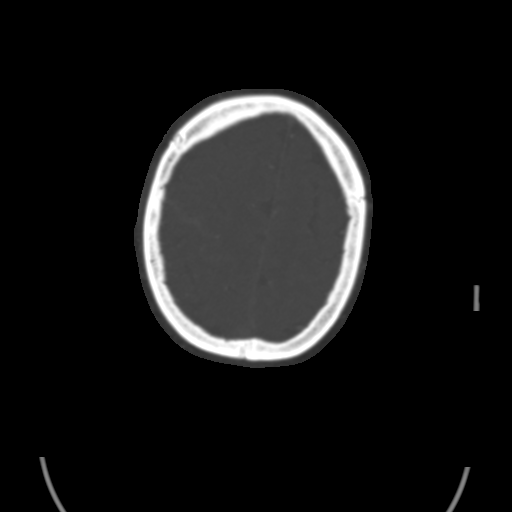

[9 of 47 positions shown; findings below may reference images not displayed]

FINDINGS: CT HEAD

Brain: Expected evolution of early subacute infarct of the right PCA
territory. Redistribution of subarachnoid blood within the right
central sulcus and posterior right hemisphere. No new site of
hemorrhage. Edema in the posterior right frontal lobe has increased.
No midline shift or other mass effect. No hydrocephalus.

Vascular: No abnormal hyperdensity of the major intracranial
arteries or dural venous sinuses. No intracranial atherosclerosis.

Skull: The visualized skull base, calvarium and extracranial soft
tissues are normal.

Sinuses/Orbits: No fluid levels or advanced mucosal thickening of
the visualized paranasal sinuses. No mastoid or middle ear effusion.
The orbits are normal.

CTA HEAD

POSTERIOR CIRCULATION:

--Vertebral arteries: Normal

--Inferior cerebellar arteries: Normal.

--Basilar artery: Normal.

--Superior cerebellar arteries: Normal.

--Posterior cerebral arteries: Focal calcification within the right
PCA distal P3 segment, likely calcified thromboembolism. The right
PCA is patent distal to this area and otherwise normal. Left PCA
normal.

ANTERIOR CIRCULATION:

--Intracranial internal carotid arteries: Normal.

--Anterior cerebral arteries (ACA): Normal.

--Middle cerebral arteries (MCA): Normal.

ANATOMIC VARIANTS: None

Review of the MIP images confirms the above findings.
IMPRESSION: 1. No aneurysm.
2. Expected evolution of early subacute infarct of the right PCA
territory with slightly increased edema in the posterior right
frontal lobe.
3. Redistribution of subarachnoid blood within the right central
sulcus and posterior right hemisphere. No new site of hemorrhage.
4. No intracranial arterial occlusion or high-grade stenosis.
5. Focal calcification within the right PCA distal P3 segment,
likely calcified thromboembolism.

## 2022-12-24 ENCOUNTER — Telehealth: Payer: Self-pay | Admitting: Physical Therapy

## 2022-12-24 NOTE — Telephone Encounter (Signed)
Dr. Conchita Paris,  [name] was evaluated by Physical Therapy on 12/20/22.  The patient would benefit from an Occupational Therapy evaluation for ongoing LUE weakness due to his history of CVA.    If you agree, please place an order in [facility] workque in [facility] or fax the order to [phone]. Thank you, Peter Congo, PT, DPT, [facility] [address] [place], [place]  [address] Phone:  [phone] Fax:  [phone]

## 2022-12-26 IMAGING — CT CT ANGIO NECK
2 of 7 series · 8 of 33 positions shown · IV contrast (omnipaque)
Comparison: 03/08/2021

CLINICAL DATA: Acute neurologic deficit

EXAM:
CT ANGIOGRAPHY NECK
TECHNIQUE: Multidetector CT imaging of the neck was performed using the
standard protocol during bolus administration of intravenous
contrast. Multiplanar CT image reconstructions and MIPs were
obtained to evaluate the vascular anatomy. Carotid stenosis
measurements (when applicable) are obtained utilizing NASCET
criteria, using the distal internal carotid diameter as the
denominator.
CONTRAST:  100mL OMNIPAQUE IOHEXOL 350 MG/ML SOLN

[Series 7: cta neck · axial · 0.48mm/px · z∈[+1041,+1127]mm · 2 of 130 slices shown]
[im 44/130  soft-tissue]
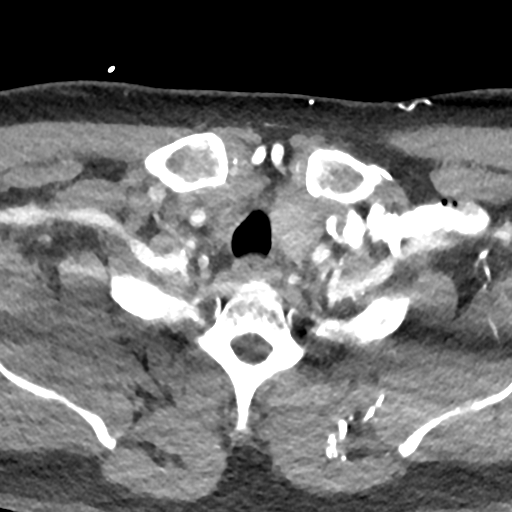
[im 87/130  soft-tissue]
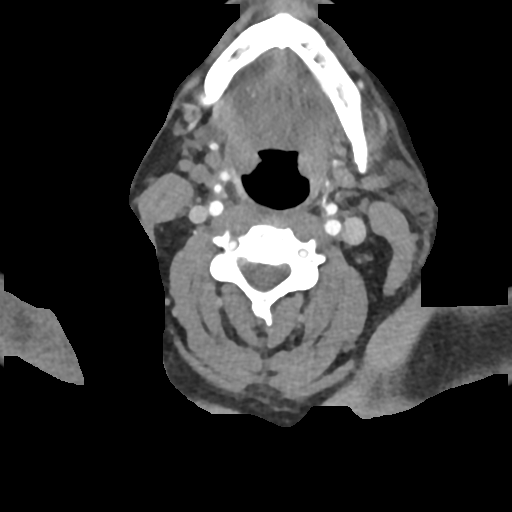

[Series 9: ax thins · axial · 0.38mm/px · z∈[+959,+1148]mm · 6 of 277 slices shown]
[im 40/277  soft-tissue]
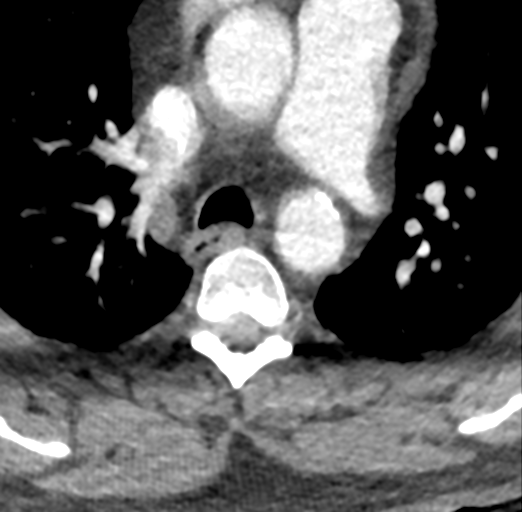
[im 79/277  bone]
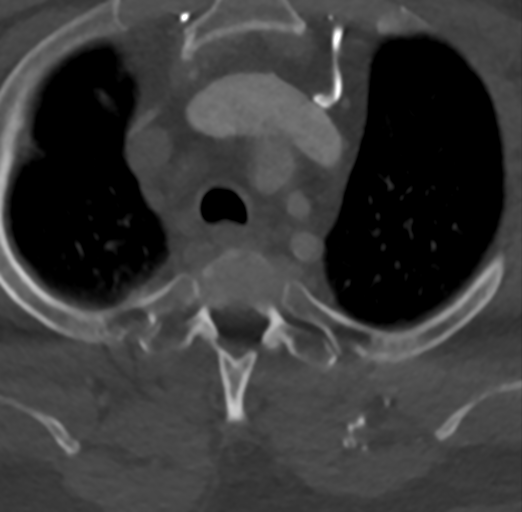
[im 119/277  soft-tissue]
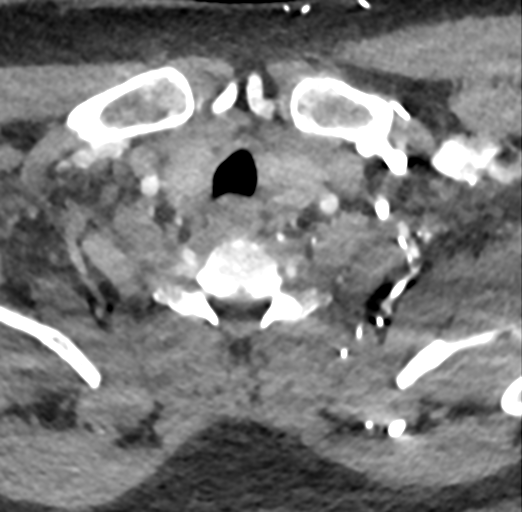
[im 158/277  bone]
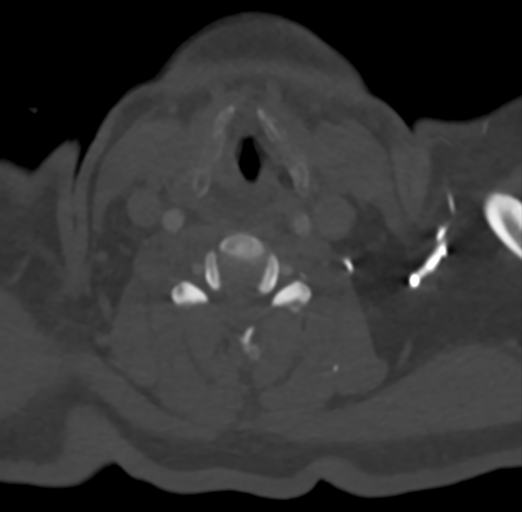
[im 198/277  soft-tissue]
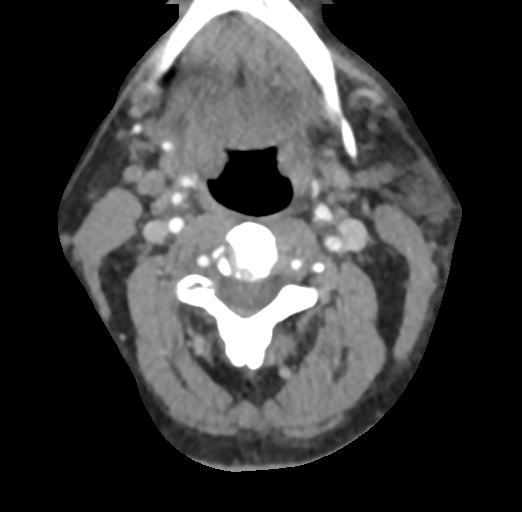
[im 237/277  bone]
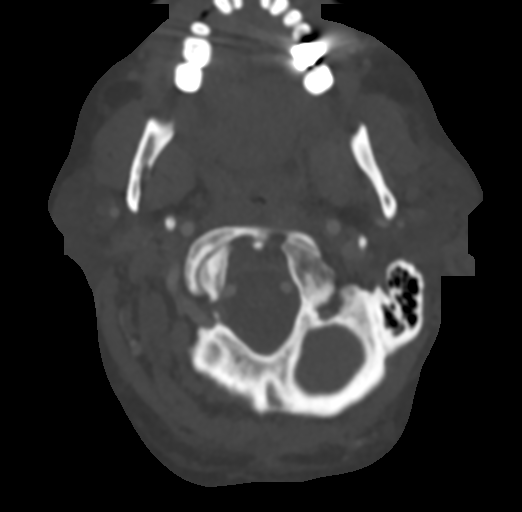

[8 of 33 positions shown; findings below may reference images not displayed]

FINDINGS: Aortic arch: Standard branching. Imaged portion shows no evidence of
aneurysm or dissection. No significant stenosis of the major arch
vessel origins.

Right carotid system: No evidence of dissection, stenosis (50% or
greater) or occlusion.

Left carotid system: No evidence of dissection, stenosis (50% or
greater) or occlusion.

Vertebral arteries: Codominant. No evidence of dissection, stenosis
(50% or greater) or occlusion.

Skeleton: Multifocal sclerotic change within the vertebral column is
unchanged since 03/08/2021.

Other neck: Unchanged mass in the deep lobe of the right parotid
gland.

Upper chest: Negative
IMPRESSION: 1. No dissection, occlusion or hemodynamically significant stenosis
of the carotid or vertebral arteries.
2. Multifocal sclerotic change within the vertebral column,
unchanged since 03/08/2021, consistent with treated metastatic
disease.

## 2022-12-31 NOTE — Progress Notes (Signed)
Merlin Loop Recorder  

## 2023-01-02 ENCOUNTER — Telehealth: Payer: Self-pay | Admitting: Physical Therapy

## 2023-01-02 NOTE — Telephone Encounter (Signed)
Dr. Riley Kill,   [name] was evaluated by Physical Therapy on 12/20/22.  The patient would benefit from an Occupational Therapy evaluation for ongoing LUE weakness due to his history of CVA.    I did reach out to his PT referring physician (his surgeon) but have not yet received a response.   If you agree, please place an order in [facility] workque in [facility] or fax the order to [id number]. Thank you, Peter Congo, PT, DPT, [facility]  [address] [place], [place]  [address] Phone:  [phone] Fax:  [phone]

## 2023-01-03 ENCOUNTER — Ambulatory Visit: Payer: Medicare HMO | Admitting: Physical Therapy

## 2023-01-03 VITALS — BP 157/76 | HR 55

## 2023-01-03 DIAGNOSIS — I69354 Hemiplegia and hemiparesis following cerebral infarction affecting left non-dominant side: Secondary | ICD-10-CM

## 2023-01-03 DIAGNOSIS — M6281 Muscle weakness (generalized): Secondary | ICD-10-CM

## 2023-01-03 DIAGNOSIS — R2681 Unsteadiness on feet: Secondary | ICD-10-CM

## 2023-01-03 DIAGNOSIS — R2689 Other abnormalities of gait and mobility: Secondary | ICD-10-CM

## 2023-01-03 NOTE — Therapy (Signed)
OUTPATIENT PHYSICAL THERAPY CERVICAL TREATMENT   Patient Name: Joseph Ortega MRN: [id number] DOB:Oct 02, 1955, 67 y.o., male Today's Date: 01/03/2023  END OF SESSION:  PT End of Session - 01/03/23 0940     Visit Number 2    Number of Visits 9   with eval   Date for PT Re-Evaluation 02/28/23    Authorization Type [employer]    PT Start Time 0935    PT Stop Time 1015    PT Time Calculation (min) 40 min    Activity Tolerance Patient tolerated treatment well    Behavior During Therapy [facility] for tasks assessed/performed             Past Medical History:  Diagnosis Date   Anxiety    Arthritis    Depression    Diabetes mellitus without complication (HCC)    Headache    Hypertension    Hypothyroidism    Sleep apnea    wears CPAP   Stroke (HCC)    1 stroke 2/24, and 2 strokes on 03/09/21   Thyroid disease    Past Surgical History:  Procedure Laterality Date   ANTERIOR CERVICAL DECOMP/DISCECTOMY FUSION N/A 11/23/2022   Procedure: Anterior Cervical Decompression Fusion  Cervical four-five;  Surgeon: Lisbeth Renshaw, MD;  Location: [facility] OR;  Service: Neurosurgery;  Laterality: N/A;   GROIN DEBRIDEMENT Left    ingrown hair, possibly abscesses, surgery to treat infection   HERNIA REPAIR Left    inguinal   IR ANGIO INTRA EXTRACRAN SEL COM CAROTID INNOMINATE BILAT MOD SED  03/09/2021   IR ANGIO VERTEBRAL SEL VERTEBRAL BILAT MOD SED  03/09/2021   IR US GUIDE VASC ACCESS RIGHT  03/09/2021   LOOP RECORDER INSERTION N/A 05/07/2022   Procedure: LOOP RECORDER INSERTION;  Surgeon: Lanier Prude, MD;  Location: [facility] INVASIVE CV LAB;  Service: Cardiovascular;  Laterality: N/A;   Patient Active Problem List   Diagnosis Date Noted   Cervical stenosis of spine 11/23/2022   Cervicalgia 08/15/2022   Hyperlipidemia 05/07/2022   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   Stroke (cerebrum) (HCC) 05/02/2022   ICH (intracerebral hemorrhage) (HCC) 03/17/2021   Hypothyroidism  03/12/2021   OSA (obstructive sleep apnea) 03/12/2021   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   Uncontrolled type 2 diabetes mellitus with hyperglycemia (HCC) 03/12/2021   Redness of both eyes 03/12/2021   Morbid obesity (HCC) 03/11/2021   Essential hypertension 03/11/2021   Tobacco abuse 03/11/2021   Herpes zoster with complication    SAH (subarachnoid hemorrhage) (HCC) 03/08/2021    PCP: None at this time, needs info to get setup with a new one  REFERRING PROVIDER: Lisbeth Renshaw, MD  REFERRING DIAG: [id number] (ICD-10-CM) - Spinal stenosis, cervical region  THERAPY DIAG:  Muscle weakness (generalized)  Hemiplegia and hemiparesis following cerebral infarction affecting left non-dominant side (HCC)  Unsteadiness on feet  Other abnormalities of gait and mobility  Rationale for Evaluation and Treatment: Rehabilitation  ONSET DATE: 12/10/2022  SUBJECTIVE:  SUBJECTIVE STATEMENT: Pt reports he is "hanging in there". Left leg is still very swollen. Reports he is having difficulty swallowing, food and meds are getting caught in his throat. Denies falls or pain. "Just normal aches and pains".    Hand dominance: Right  PERTINENT HISTORY:  PMH includes Anxiety, Arthritis, Depression, Diabetes mellitus, Headache, HTN, Hypothyroidism, Sleep apnea, Stroke (left hemi).  PAIN:  Are you having pain? No  PRECAUTIONS: Fall and Other: cervical  No ROM restrictions and no brace per patient; does have 20-25# lifting restriction and no lifting overhead   RED FLAGS: None     WEIGHT BEARING RESTRICTIONS: No  FALLS:  Has patient fallen in last 6 months? No  LIVING ENVIRONMENT: Lives with: lives with their family wife Lives in: House/apartment Stairs: Yes: Internal: 2 steps; none no issues  with stairs Has following equipment at home: Dan Humphreys - 2 wheeled and [employer] - 4 wheeled  OCCUPATION: retired/disability  PLOF: Independent with gait, Independent with transfers, and Requires assistive device for independence  PATIENT GOALS: "get more independent"  NEXT MD VISIT: sees Dr. Conchita Paris again 10/28  OBJECTIVE:  Note: Objective measures were completed at Evaluation unless otherwise noted.  DIAGNOSTIC FINDINGS:  MRI Cervical Spine 09/02/22 prior to surgery on 11/23/22 IMPRESSION: 1. At C4-5 there is a broad-based disc bulge with a broad central disc protrusion with mass effect on the ventral cervical spinal cord. Severe spinal stenosis. Bilateral uncovertebral degenerative changes. Severe bilateral foraminal stenosis. 2. Cervical spine spondylosis as described above. 3. No acute osseous injury of the cervical spine.   COGNITION: Overall cognitive status: Within functional limits for tasks assessed  SENSATION: WFL UE and LE  POSTURE: rounded shoulders and forward head   EDEMA: Pitting edema in LLE, pt reports MD is aware and he is on fluid pills; unable to don TED hose due to physical impairments and wife unable to physically assist  CERVICAL ROM: not assessed at eval  Active ROM A/PROM (deg) eval  Flexion   Extension   Right lateral flexion   Left lateral flexion   Right rotation   Left rotation    (Blank rows = not tested)   UPPER EXTREMITY MMT:  MMT Right eval Left eval  Shoulder flexion 5 Not assessed, will defer to OT eval  Shoulder extension    Shoulder abduction 5   Shoulder adduction    Shoulder extension    Shoulder internal rotation    Shoulder external rotation    Middle trapezius    Lower trapezius    Elbow flexion 5   Elbow extension 5   Wrist flexion    Wrist extension    Wrist ulnar deviation    Wrist radial deviation    Wrist pronation    Wrist supination    Grip strength WFL    (Blank rows = not tested)   LOWER EXTREMITY  MMT:    MMT Right eval Left eval  Hip flexion 5 3  Hip extension    Hip abduction    Hip adduction    Hip internal rotation    Hip external rotation    Knee flexion 5 3  Knee extension 5 4  Ankle dorsiflexion 3 2-  Ankle plantarflexion    Ankle inversion    Ankle eversion     (Blank rows = not tested)   VITALS  Vitals:   01/03/23 0953  BP: (!) 157/76  Pulse: (!) 55     TODAY'S TREATMENT:  Ther Act  Assessed BP (  see above) and WNL this date Provided new printouts w/information on obtaining a PCP ([facility] and [facility] on [address]) and strongly encouraged pt to call today to get established, as pt does not have consistent MD.  Informed pt that he could benefit from SLP referral due to difficulty swallowing. Pt reports he would like to think about it at this time.  Per pt request, worked on sit to stands for remainder of session as pt reports difficulty w/this at home. Placed airex under pt's bottom to elevate seat height to 20" and worked on proper anterior weight shift to stand, as pt relies heavily on posterior bracing against seated surface to stand. Pt unable to facilitate strong anterior weight shift due to fear of falling and requested to place hands on rollator to stand. Pt able to stand if pulling rollator towards him, but informed pt that this is not safe. Pt reports he feels very limited by his swelling in LLE and overall fatigue but will continue to work on transfers at home.    Gait pattern: step through pattern, decreased step length- Left, decreased stride length, decreased hip/knee flexion- Left, decreased ankle dorsiflexion- Left, lateral hip instability, wide BOS, and poor foot clearance- Left Distance walked: Various clinic distances  Assistive device utilized: Walker - 4 wheeled Level of assistance: CGA Comments: Pt very unsteady this date due to LLE weakness and overall deconditioning. Only able to ambulate w/rollator if  locks are on due to heavy reliance on BUE support for balance     PATIENT EDUCATION:  Education details: Importance of getting established w/PCP, working on sit to stands at home  Person educated: Patient Education method: Chief Technology Officer Education comprehension: verbalized understanding, returned demonstration, and needs further education  HOME EXERCISE PROGRAM: To be reviewed from previous POC and revised as appropriate [id number]    ASSESSMENT:  CLINICAL IMPRESSION: Emphasis of skilled PT session on pt education on getting established w/a PCP and transfers. Pt reports ongoing swelling in LLE that does not reduce w/diuretics and makes walking difficult. Continue to strongly encourage pt call to get established with a PCP today and provided pt w/contact info of 2 clinics for the third time. Pt reports he has been choking on his medications and food, but pt declines SLP eval at this time and states he will discuss it with his neurosurgeon on 10/28. Continue POC.    OBJECTIVE IMPAIRMENTS: Abnormal gait, decreased activity tolerance, decreased balance, decreased coordination, decreased endurance, decreased mobility, difficulty walking, decreased ROM, decreased strength, impaired perceived functional ability, impaired UE functional use, postural dysfunction, and pain.   ACTIVITY LIMITATIONS: carrying, lifting, bending, standing, squatting, stairs, and transfers  PARTICIPATION LIMITATIONS: meal prep, cleaning, laundry, interpersonal relationship, driving, and community activity  PERSONAL FACTORS: Time since onset of injury/illness/exacerbation, Transportation, and 3+ comorbidities:     Anxiety, Arthritis, Depression, Diabetes mellitus, Headache, HTN, Hypothyroidism, Sleep apnea, Stroke (left hemi).are also affecting patient's functional outcome.   REHAB POTENTIAL: Good  CLINICAL DECISION MAKING: Stable/uncomplicated  EVALUATION COMPLEXITY: Moderate   GOALS: Goals reviewed  with patient? Yes  SHORT TERM GOALS: Target date: 01/20/2023   Pt will be independent with initial review of HEP for improved strength, balance, transfers and gait. Baseline:  Goal status: INITIAL  2.  Pt will improve normal TUG to less than or equal to 25 seconds for improved functional mobility and decreased fall risk. Baseline: 31.28 sec with rollator (10/3) Goal status: INITIAL  3.  Pt  will improve gait velocity to at least 1.5 ft/sec for improved gait efficiency and performance at mod I level   Baseline: 1.35 ft/sec with rollator mod I (10/3) Goal status: INITIAL  4.  Berg to be assessed and STG set Baseline:  Goal status: INITIAL   LONG TERM GOALS: Target date: 02/17/2023   Pt will be independent with final review of HEP for improved strength, balance, transfers and gait. Baseline:  Goal status: INITIAL  2.  Pt will improve 5 x STS to less than or equal to 15 seconds to demonstrate improved functional strength and transfer efficiency.  Baseline: 17.22 RUE (10/3) Goal status: INITIAL  3.  Pt will improve normal TUG to less than or equal to 20 seconds for improved functional mobility and decreased fall risk. Baseline: 31.28 sec with rollator (10/3) Goal status: INITIAL  4.  Pt will improve gait velocity to at least 1.75 ft/sec for improved gait efficiency and performance at mod I level   Baseline: 1.35 ft/sec with rollator mod I (10/3) Goal status: INITIAL  5.  Berg to be assessed and LTG set Baseline:  Goal status: INITIAL     PLAN:  PT FREQUENCY: 1x/week  PT DURATION:  8 sessions  PLANNED INTERVENTIONS: Therapeutic exercises, Therapeutic activity, Neuromuscular re-education, Balance training, Gait training, Patient/Family education, Self Care, Joint mobilization, Stair training, Orthotic/Fit training, DME instructions, Dry Needling, Electrical stimulation, Cryotherapy, Moist heat, Taping, Manual therapy, and Re-evaluation  PLAN FOR NEXT SESSION: assess Berg  and write STG/LTG, review prior HEP and revise as appropriate, LLE NMR, did we get OT referral? Did he make PCP appointment?   Jill Alexanders Braidyn Scorsone, PT, DPT 01/03/2023, 10:19 AM

## 2023-01-10 ENCOUNTER — Ambulatory Visit: Payer: Medicare HMO | Admitting: Physical Therapy

## 2023-01-10 VITALS — BP 160/76 | HR 65

## 2023-01-10 DIAGNOSIS — M6281 Muscle weakness (generalized): Secondary | ICD-10-CM | POA: Diagnosis not present

## 2023-01-10 DIAGNOSIS — M542 Cervicalgia: Secondary | ICD-10-CM

## 2023-01-10 DIAGNOSIS — R2689 Other abnormalities of gait and mobility: Secondary | ICD-10-CM

## 2023-01-10 NOTE — Therapy (Signed)
OUTPATIENT PHYSICAL THERAPY CERVICAL TREATMENT   Patient Name: Joseph Ortega MRN: [id number] DOB:[birth year], 67 y.o., male Today's Date: 01/10/2023  END OF SESSION:  PT End of Session - 01/10/23 1108     Visit Number 3    Number of Visits 9   with eval   Date for PT Re-Evaluation 02/28/23    Authorization Type [employer]    Authorization Time Period 9 visits approved from 12/20/22-02/28/23    PT Start Time 1103    PT Stop Time 1147    PT Time Calculation (min) 44 min    Activity Tolerance Patient limited by pain    Behavior During Therapy [facility] for tasks assessed/performed              Past Medical History:  Diagnosis Date   Anxiety    Arthritis    Depression    Diabetes mellitus without complication (HCC)    Headache    Hypertension    Hypothyroidism    Sleep apnea    wears CPAP   Stroke (HCC)    1 stroke 2/24, and 2 strokes on 03/09/21   Thyroid disease    Past Surgical History:  Procedure Laterality Date   ANTERIOR CERVICAL DECOMP/DISCECTOMY FUSION N/A 11/23/2022   Procedure: Anterior Cervical Decompression Fusion  Cervical four-five;  Surgeon: Lisbeth Renshaw, MD;  Location: [facility] OR;  Service: Neurosurgery;  Laterality: N/A;   GROIN DEBRIDEMENT Left    ingrown hair, possibly abscesses, surgery to treat infection   HERNIA REPAIR Left    inguinal   IR ANGIO INTRA EXTRACRAN SEL COM CAROTID INNOMINATE BILAT MOD SED  03/09/2021   IR ANGIO VERTEBRAL SEL VERTEBRAL BILAT MOD SED  03/09/2021   IR US GUIDE VASC ACCESS RIGHT  03/09/2021   LOOP RECORDER INSERTION N/A 05/07/2022   Procedure: LOOP RECORDER INSERTION;  Surgeon: Lanier Prude, MD;  Location: [facility] INVASIVE CV LAB;  Service: Cardiovascular;  Laterality: N/A;   Patient Active Problem List   Diagnosis Date Noted   Cervical stenosis of spine 11/23/2022   Cervicalgia 08/15/2022   Hyperlipidemia 05/07/2022   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   Stroke (cerebrum) (HCC) 05/02/2022    ICH (intracerebral hemorrhage) (HCC) 03/17/2021   Hypothyroidism 03/12/2021   OSA (obstructive sleep apnea) 03/12/2021   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   Uncontrolled type 2 diabetes mellitus with hyperglycemia (HCC) 03/12/2021   Redness of both eyes 03/12/2021   Morbid obesity (HCC) 03/11/2021   Essential hypertension 03/11/2021   Tobacco abuse 03/11/2021   Herpes zoster with complication    SAH (subarachnoid hemorrhage) (HCC) 03/08/2021    PCP: None at this time, needs info to get setup with a new one  REFERRING PROVIDER: Lisbeth Renshaw, MD  REFERRING DIAG: [phone] (ICD-10-CM) - Spinal stenosis, cervical region  THERAPY DIAG:  Muscle weakness (generalized)  Other abnormalities of gait and mobility  Cervicalgia  Rationale for Evaluation and Treatment: Rehabilitation  ONSET DATE: 12/10/2022  SUBJECTIVE:  SUBJECTIVE STATEMENT: Pt reports doing okay. Has not reached out to a PCP yet. Having pain in his R shoulder and neck today, rating as 2.5/10. No falls.    Hand dominance: Right  PERTINENT HISTORY:  PMH includes Anxiety, Arthritis, Depression, Diabetes mellitus, Headache, HTN, Hypothyroidism, Sleep apnea, Stroke (left hemi).  PAIN:  Are you having pain? Yes: NPRS scale: 2.5/10 Pain location: Neck and R shoulder Pain description: Achy/throbbing  PRECAUTIONS: Fall and Other: cervical  No ROM restrictions and no brace per patient; does have 20-25# lifting restriction and no lifting overhead   RED FLAGS: None     WEIGHT BEARING RESTRICTIONS: No  FALLS:  Has patient fallen in last 6 months? No  LIVING ENVIRONMENT: Lives with: lives with their family wife Lives in: House/apartment Stairs: Yes: Internal: 2 steps; none no issues with stairs Has following equipment  at home: Dan Humphreys - 2 wheeled and [employer] - 4 wheeled  OCCUPATION: retired/disability  PLOF: Independent with gait, Independent with transfers, and Requires assistive device for independence  PATIENT GOALS: "get more independent"  NEXT MD VISIT: sees Dr. Conchita Paris again 10/28  OBJECTIVE:  Note: Objective measures were completed at Evaluation unless otherwise noted.  DIAGNOSTIC FINDINGS:  MRI Cervical Spine 09/02/22 prior to surgery on 11/23/22 IMPRESSION: 1. At C4-5 there is a broad-based disc bulge with a broad central disc protrusion with mass effect on the ventral cervical spinal cord. Severe spinal stenosis. Bilateral uncovertebral degenerative changes. Severe bilateral foraminal stenosis. 2. Cervical spine spondylosis as described above. 3. No acute osseous injury of the cervical spine.   COGNITION: Overall cognitive status: Within functional limits for tasks assessed  SENSATION: WFL UE and LE  POSTURE: rounded shoulders and forward head   EDEMA: Pitting edema in LLE, pt reports MD is aware and he is on fluid pills; unable to don TED hose due to physical impairments and wife unable to physically assist  CERVICAL ROM: not assessed at eval  Active ROM A/PROM (deg) eval  Flexion   Extension   Right lateral flexion   Left lateral flexion   Right rotation   Left rotation    (Blank rows = not tested)   UPPER EXTREMITY MMT:  MMT Right eval Left eval  Shoulder flexion 5 Not assessed, will defer to OT eval  Shoulder extension    Shoulder abduction 5   Shoulder adduction    Shoulder extension    Shoulder internal rotation    Shoulder external rotation    Middle trapezius    Lower trapezius    Elbow flexion 5   Elbow extension 5   Wrist flexion    Wrist extension    Wrist ulnar deviation    Wrist radial deviation    Wrist pronation    Wrist supination    Grip strength WFL    (Blank rows = not tested)   LOWER EXTREMITY MMT:    MMT Right eval Left eval   Hip flexion 5 3  Hip extension    Hip abduction    Hip adduction    Hip internal rotation    Hip external rotation    Knee flexion 5 3  Knee extension 5 4  Ankle dorsiflexion 3 2-  Ankle plantarflexion    Ankle inversion    Ankle eversion     (Blank rows = not tested)   VITALS  Vitals:   01/10/23 1111  BP: (!) 160/76  Pulse: 65      TODAY'S TREATMENT:  Ther Act  Assessed  BP (see above) and systolic BP elevated but within limits for PT  Emphasized importance of getting a PCP, as pt could benefit from a care aide at home and may be more appropriate for home health therapy. Pt reports he is waiting on his wife to review the doctors prior to calling one and she has not felt well enough to look at them. Encouraged pt to advocate for himself and call the office today, as he has no provider to refill prescriptions (he is already out of some meds) and is not medically managed. Pt verbalized understanding and stated he would call today.   Ther Ex Pt performed the following exercises on the mat for improved shoulder ROM, cervical mobility and pain modulation. Added to HEP (see bolded below): Supine shoulder flexion w/PVC, x20 reps. Pt reported feeling a goodstretch w/this. Min cues for wider hand placement on PVC for reduced shoulder impingement.  Supine shoulder ER on R side w/PVC, x15 reps. Min cues to maintain breathing throughout.  Seated scapular retraction, x15 reps  Seated upper trap stretch,6 x5s holds as this very painful and limited for pt.    Gait pattern: step through pattern, decreased step length- Left, decreased stride length, decreased hip/knee flexion- Left, decreased ankle dorsiflexion- Left, lateral hip instability, wide BOS, and poor foot clearance- Left Distance walked: Various clinic distances  Assistive device utilized: Walker - 4 wheeled Level of assistance: CGA Comments: Pt very unsteady this date due to LLE weakness and overall deconditioning. Only able to  ambulate w/rollator if locks are on due to heavy reliance on BUE support for balance     PATIENT EDUCATION:  Education details: Importance of getting established w/PCP, additions to HEP Person educated: Patient Education method: Explanation and Handouts Education comprehension: verbalized understanding, returned demonstration, and needs further education  HOME EXERCISE PROGRAM: To be reviewed from previous POC and revised as appropriate [id number]   - Supine Shoulder Flexion with Dowel  - 1 x daily - 7 x weekly - 3 sets - 10 reps - Supine Shoulder External Rotation with Dowel at 20 Degrees of Abduction  - 1 x daily - 7 x weekly - 3 sets - 10 reps - Seated Scapular Retraction  - 1 x daily - 7 x weekly - 3 sets - 10 reps - Seated Upper Trapezius Stretch  - 1 x daily - 7 x weekly - 3 sets - 10-20 second hold   ASSESSMENT:  CLINICAL IMPRESSION: Emphasis of skilled PT session on pt education on getting established w/a PCP and adding to HEP for improved shoulder and cervical mobility. Pt reports he did not get established with a PCP last week as he is waiting on his wife to approve a PCP. Strongly encouraged pt to call today as pt is not medically managed. Pt tolerated session well but did require cues to move slowly w/exercises to reduce pain levels. Continue POC.    OBJECTIVE IMPAIRMENTS: Abnormal gait, decreased activity tolerance, decreased balance, decreased coordination, decreased endurance, decreased mobility, difficulty walking, decreased ROM, decreased strength, impaired perceived functional ability, impaired UE functional use, postural dysfunction, and pain.   ACTIVITY LIMITATIONS: carrying, lifting, bending, standing, squatting, stairs, and transfers  PARTICIPATION LIMITATIONS: meal prep, cleaning, laundry, interpersonal relationship, driving, and community activity  PERSONAL FACTORS: Time since onset of injury/illness/exacerbation, Transportation, and 3+ comorbidities:      Anxiety, Arthritis, Depression, Diabetes mellitus, Headache, HTN, Hypothyroidism, Sleep apnea, Stroke (left hemi).are also affecting patient's functional outcome.   REHAB POTENTIAL: Good  CLINICAL  DECISION MAKING: Stable/uncomplicated  EVALUATION COMPLEXITY: Moderate   GOALS: Goals reviewed with patient? Yes  SHORT TERM GOALS: Target date: 01/20/2023   Pt will be independent with initial review of HEP for improved strength, balance, transfers and gait. Baseline:  Goal status: INITIAL  2.  Pt will improve normal TUG to less than or equal to 25 seconds for improved functional mobility and decreased fall risk. Baseline: 31.28 sec with rollator (10/3) Goal status: INITIAL  3.  Pt will improve gait velocity to at least 1.5 ft/sec for improved gait efficiency and performance at mod I level   Baseline: 1.35 ft/sec with rollator mod I (10/3) Goal status: INITIAL  4.  Berg to be assessed and STG set Baseline:  Goal status: INITIAL   LONG TERM GOALS: Target date: 02/17/2023   Pt will be independent with final review of HEP for improved strength, balance, transfers and gait. Baseline:  Goal status: INITIAL  2.  Pt will improve 5 x STS to less than or equal to 15 seconds to demonstrate improved functional strength and transfer efficiency.  Baseline: 17.22 RUE (10/3) Goal status: INITIAL  3.  Pt will improve normal TUG to less than or equal to 20 seconds for improved functional mobility and decreased fall risk. Baseline: 31.28 sec with rollator (10/3) Goal status: INITIAL  4.  Pt will improve gait velocity to at least 1.75 ft/sec for improved gait efficiency and performance at mod I level   Baseline: 1.35 ft/sec with rollator mod I (10/3) Goal status: INITIAL  5.  Berg to be assessed and LTG set Baseline:  Goal status: INITIAL     PLAN:  PT FREQUENCY: 1x/week  PT DURATION:  8 sessions  PLANNED INTERVENTIONS: Therapeutic exercises, Therapeutic activity, Neuromuscular  re-education, Balance training, Gait training, Patient/Family education, Self Care, Joint mobilization, Stair training, Orthotic/Fit training, DME instructions, Dry Needling, Electrical stimulation, Cryotherapy, Moist heat, Taping, Manual therapy, and Re-evaluation  PLAN FOR NEXT SESSION: assess Berg and write STG/LTG, review prior HEP and revise as appropriate, LLE NMR, did we get OT referral? Did he make PCP appointment?   Jill Alexanders Demontray Franta, PT, DPT 01/10/2023, 11:52 AM

## 2023-01-17 ENCOUNTER — Ambulatory Visit (INDEPENDENT_AMBULATORY_CARE_PROVIDER_SITE_OTHER): Payer: Medicare HMO

## 2023-01-17 ENCOUNTER — Ambulatory Visit: Payer: Medicare HMO

## 2023-01-17 VITALS — BP 160/77 | HR 61

## 2023-01-17 DIAGNOSIS — I639 Cerebral infarction, unspecified: Secondary | ICD-10-CM

## 2023-01-17 DIAGNOSIS — M542 Cervicalgia: Secondary | ICD-10-CM

## 2023-01-17 DIAGNOSIS — M6281 Muscle weakness (generalized): Secondary | ICD-10-CM

## 2023-01-17 DIAGNOSIS — R2689 Other abnormalities of gait and mobility: Secondary | ICD-10-CM

## 2023-01-17 DIAGNOSIS — R278 Other lack of coordination: Secondary | ICD-10-CM

## 2023-01-17 DIAGNOSIS — R2681 Unsteadiness on feet: Secondary | ICD-10-CM

## 2023-01-17 LAB — CUP PACEART REMOTE DEVICE CHECK
Date Time Interrogation Session: 20241031020507
Implantable Pulse Generator Implant Date: 20240219
Pulse Gen Serial Number: 511022381

## 2023-01-17 NOTE — Therapy (Signed)
OUTPATIENT PHYSICAL THERAPY CERVICAL TREATMENT   Patient Name: Joseph Ortega MRN: [id number] DOB:[birth year], 67 y.o., male Today's Date: 01/17/2023  END OF SESSION:  PT End of Session - 01/17/23 0938     Visit Number 4    Number of Visits 9    Date for PT Re-Evaluation 02/28/23    Authorization Type [employer]    Authorization Time Period 9 visits approved from 12/20/22-02/28/23    Progress Note Due on Visit 10    PT Start Time 1015    PT Stop Time 1100    PT Time Calculation (min) 45 min    Equipment Utilized During Treatment Gait belt    Activity Tolerance Patient tolerated treatment well    Behavior During Therapy WFL for tasks assessed/performed              Past Medical History:  Diagnosis Date   Anxiety    Arthritis    Depression    Diabetes mellitus without complication (HCC)    Headache    Hypertension    Hypothyroidism    Sleep apnea    wears CPAP   Stroke (HCC)    1 stroke 2/24, and 2 strokes on 03/09/21   Thyroid disease    Past Surgical History:  Procedure Laterality Date   ANTERIOR CERVICAL DECOMP/DISCECTOMY FUSION N/A 11/23/2022   Procedure: Anterior Cervical Decompression Fusion  Cervical four-five;  Surgeon: Lisbeth Renshaw, MD;  Location: [facility] OR;  Service: Neurosurgery;  Laterality: N/A;   GROIN DEBRIDEMENT Left    ingrown hair, possibly abscesses, surgery to treat infection   HERNIA REPAIR Left    inguinal   IR ANGIO INTRA EXTRACRAN SEL COM CAROTID INNOMINATE BILAT MOD SED  03/09/2021   IR ANGIO VERTEBRAL SEL VERTEBRAL BILAT MOD SED  03/09/2021   IR US GUIDE VASC ACCESS RIGHT  03/09/2021   LOOP RECORDER INSERTION N/A 05/07/2022   Procedure: LOOP RECORDER INSERTION;  Surgeon: Lanier Prude, MD;  Location: [facility] INVASIVE CV LAB;  Service: Cardiovascular;  Laterality: N/A;   Patient Active Problem List   Diagnosis Date Noted   Cervical stenosis of spine 11/23/2022   Cervicalgia 08/15/2022   Hyperlipidemia 05/07/2022   Right middle  cerebral artery stroke (HCC) 05/07/2022   Stroke (cerebrum) (HCC) 05/02/2022   ICH (intracerebral hemorrhage) (HCC) 03/17/2021   Hypothyroidism 03/12/2021   OSA (obstructive sleep apnea) 03/12/2021   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   Uncontrolled type 2 diabetes mellitus with hyperglycemia (HCC) 03/12/2021   Redness of both eyes 03/12/2021   Morbid obesity (HCC) 03/11/2021   Essential hypertension 03/11/2021   Tobacco abuse 03/11/2021   Herpes zoster with complication    SAH (subarachnoid hemorrhage) (HCC) 03/08/2021    PCP: None at this time, needs info to get setup with a new one  REFERRING PROVIDER: Lisbeth Renshaw, MD  REFERRING DIAG: [phone] (ICD-10-CM) - Spinal stenosis, cervical region  THERAPY DIAG:  Muscle weakness (generalized)  Other abnormalities of gait and mobility  Cervicalgia  Unsteadiness on feet  Other lack of coordination  Rationale for Evaluation and Treatment: Rehabilitation  ONSET DATE: 12/10/2022  SUBJECTIVE:  SUBJECTIVE STATEMENT: Patient reports that he's doing fair. Heard from [facility] on [address] that they're not accepting new patients. Denies falls. Reports his swallowing is getting better, "swelling is going down." Has f/u with surgeon on 11/20.    Hand dominance: Right  PERTINENT HISTORY:  PMH includes Anxiety, Arthritis, Depression, Diabetes mellitus, Headache, HTN, Hypothyroidism, Sleep apnea, Stroke (left hemi).  PAIN:  Are you having pain? Yes: NPRS scale: 2.5/10 Pain location: Neck and R shoulder Pain description: Achy/throbbing  PRECAUTIONS: Fall and Other: cervical  No ROM restrictions and no brace per patient; does have 20-25# lifting restriction and no lifting overhead  PATIENT GOALS: "get more independent"  NEXT MD VISIT:  sees Dr. Conchita Paris again 11/20  OBJECTIVE:  Note: Objective measures were completed at Evaluation unless otherwise noted.  DIAGNOSTIC FINDINGS:  MRI Cervical Spine 09/02/22 prior to surgery on 11/23/22 IMPRESSION: 1. At C4-5 there is a broad-based disc bulge with a broad central disc protrusion with mass effect on the ventral cervical spinal cord. Severe spinal stenosis. Bilateral uncovertebral degenerative changes. Severe bilateral foraminal stenosis. 2. Cervical spine spondylosis as described above. 3. No acute osseous injury of the cervical spine.  VITALS  Vitals:   01/17/23 1020 01/17/23 1023  BP: (!) 181/87 (!) 160/77  Pulse: 64 61       TODAY'S TREATMENT:  Ther Act  Assessed BP (see above) and systolic BP elevated but within limits for PT   [facility] PT Assessment - 01/17/23 0001       Standardized Balance Assessment   Standardized Balance Assessment Berg Balance Test      Berg Balance Test   Sit to Stand Able to stand using hands after several tries    Standing Unsupported Able to stand 2 minutes with supervision    Sitting with Back Unsupported but Feet Supported on Floor or Stool Able to sit safely and securely 2 minutes    Stand to Sit Controls descent by using hands    Transfers Able to transfer with verbal cueing and /or supervision    Standing Unsupported with Eyes Closed Able to stand 10 seconds with supervision    Standing Unsupported with Feet Together Able to place feet together independently and stand for 1 minute with supervision    From Standing, Reach Forward with Outstretched Arm Reaches forward but needs supervision    From Standing Position, Pick up Object from Floor Unable to try/needs assist to keep balance    From Standing Position, Turn to Look Behind Over each Shoulder Needs supervision when turning    Turn 360 Degrees Needs assistance while turning    Standing Unsupported, Alternately Place Feet on Step/Stool Needs assistance to keep from falling or  unable to try    Standing Unsupported, One Foot in [employer] balance while stepping or standing    Standing on One Leg Unable to try or needs assist to prevent fall    Total Score 22           -multidirectional stepping strategy, initially with no AD (up to ModA needed to maintain balance), then with LBQC (MinA needed consistently) -sit <> stand x3 with B UE weight bearing through L LE (up to ModA needed)     PATIENT EDUCATION:  Education details: Importance of getting established w/PCP, continue HEP Person educated: Patient Education method: Explanation and Handouts Education comprehension: verbalized understanding, returned demonstration, and needs further education  HOME EXERCISE PROGRAM: To be reviewed from previous POC and revised as appropriate [id number]   -  Supine Shoulder Flexion with Dowel  - 1 x daily - 7 x weekly - 3 sets - 10 reps - Supine Shoulder External Rotation with Dowel at 20 Degrees of Abduction  - 1 x daily - 7 x weekly - 3 sets - 10 reps - Seated Scapular Retraction  - 1 x daily - 7 x weekly - 3 sets - 10 reps - Seated Upper Trapezius Stretch  - 1 x daily - 7 x weekly - 3 sets - 10-20 second hold   ASSESSMENT:  CLINICAL IMPRESSION: Patient seen for skilled PT session with emphasis on outcome measure assessment and balance strategy retraining. Patient demonstrated increased fall risk noted by score of 22/56 on the [facility] Scale.  <45/56 = fall risk, <42/56 = predictive of recurrent falls, <40/56 = 100% fall risk  >41 = independent, 21-40 = assistive device, 0-20 = wheelchair level  MDC 6.9 (4 pts 45-56, 5 pts 35-44, 7 pts 25-34) (ANPTA Core Set of Outcome Measures for Adults with Neurologic Conditions, 2018). Patient reports not making a PCP appt due to the clinic not accepting new patients. Delayed and inadequate balance strategies noted, especially with LOB toward the R. Continue POC.     OBJECTIVE IMPAIRMENTS: Abnormal gait, decreased activity  tolerance, decreased balance, decreased coordination, decreased endurance, decreased mobility, difficulty walking, decreased ROM, decreased strength, impaired perceived functional ability, impaired UE functional use, postural dysfunction, and pain.   ACTIVITY LIMITATIONS: carrying, lifting, bending, standing, squatting, stairs, and transfers  PARTICIPATION LIMITATIONS: meal prep, cleaning, laundry, interpersonal relationship, driving, and community activity  PERSONAL FACTORS: Time since onset of injury/illness/exacerbation, Transportation, and 3+ comorbidities:     Anxiety, Arthritis, Depression, Diabetes mellitus, Headache, HTN, Hypothyroidism, Sleep apnea, Stroke (left hemi).are also affecting patient's functional outcome.   REHAB POTENTIAL: Good  CLINICAL DECISION MAKING: Stable/uncomplicated  EVALUATION COMPLEXITY: Moderate   GOALS: Goals reviewed with patient? Yes  SHORT TERM GOALS: Target date: 01/20/2023   Pt will be independent with initial review of HEP for improved strength, balance, transfers and gait. Baseline:  Goal status: INITIAL  2.  Pt will improve normal TUG to less than or equal to 25 seconds for improved functional mobility and decreased fall risk. Baseline: 31.28 sec with rollator (10/3) Goal status: INITIAL  3.  Pt will improve gait velocity to at least 1.5 ft/sec for improved gait efficiency and performance at mod I level   Baseline: 1.35 ft/sec with rollator mod I (10/3) Goal status: INITIAL  4.  Berg to be assessed and STG set Baseline:  Goal status: INITIAL   LONG TERM GOALS: Target date: 02/17/2023   Pt will be independent with final review of HEP for improved strength, balance, transfers and gait. Baseline:  Goal status: INITIAL  2.  Pt will improve 5 x STS to less than or equal to 15 seconds to demonstrate improved functional strength and transfer efficiency.  Baseline: 17.22 RUE (10/3) Goal status: INITIAL  3.  Pt will improve normal TUG to  less than or equal to 20 seconds for improved functional mobility and decreased fall risk. Baseline: 31.28 sec with rollator (10/3) Goal status: INITIAL  4.  Pt will improve gait velocity to at least 1.75 ft/sec for improved gait efficiency and performance at mod I level   Baseline: 1.35 ft/sec with rollator mod I (10/3) Goal status: INITIAL  5.  Berg to be assessed and LTG set Baseline:  Goal status: INITIAL     PLAN:  PT FREQUENCY: 1x/week  PT DURATION:  8 sessions  PLANNED INTERVENTIONS: Therapeutic exercises, Therapeutic activity, Neuromuscular re-education, Balance training, Gait training, Patient/Family education, Self Care, Joint mobilization, Stair training, Orthotic/Fit training, DME instructions, Dry Needling, Electrical stimulation, Cryotherapy, Moist heat, Taping, Manual therapy, and Re-evaluation  PLAN FOR NEXT SESSION: assess Berg and write STG/LTG, review prior HEP and revise as appropriate, LLE NMR, did we get OT referral? Did he make PCP appointment?   Westley Foots, PT, DPT, CBIS 01/17/2023, 11:26 AM

## 2023-01-24 ENCOUNTER — Ambulatory Visit: Payer: Medicare HMO | Admitting: Physical Therapy

## 2023-01-29 ENCOUNTER — Ambulatory Visit: Payer: Medicare HMO | Admitting: Podiatry

## 2023-01-29 ENCOUNTER — Encounter: Payer: Self-pay | Admitting: Podiatry

## 2023-01-29 DIAGNOSIS — M79674 Pain in right toe(s): Secondary | ICD-10-CM

## 2023-01-29 DIAGNOSIS — M79675 Pain in left toe(s): Secondary | ICD-10-CM

## 2023-01-29 DIAGNOSIS — B351 Tinea unguium: Secondary | ICD-10-CM

## 2023-01-30 NOTE — Progress Notes (Signed)
[facility] Summary Report / Loop Recorder 

## 2023-01-31 ENCOUNTER — Ambulatory Visit: Payer: Medicare HMO | Attending: Physical Medicine & Rehabilitation | Admitting: Physical Therapy

## 2023-01-31 VITALS — BP 153/69 | HR 57

## 2023-01-31 DIAGNOSIS — R2681 Unsteadiness on feet: Secondary | ICD-10-CM | POA: Insufficient documentation

## 2023-01-31 DIAGNOSIS — I69354 Hemiplegia and hemiparesis following cerebral infarction affecting left non-dominant side: Secondary | ICD-10-CM | POA: Diagnosis present

## 2023-01-31 DIAGNOSIS — M6281 Muscle weakness (generalized): Secondary | ICD-10-CM | POA: Diagnosis present

## 2023-01-31 DIAGNOSIS — R2689 Other abnormalities of gait and mobility: Secondary | ICD-10-CM | POA: Diagnosis present

## 2023-01-31 NOTE — Therapy (Signed)
OUTPATIENT PHYSICAL THERAPY CERVICAL TREATMENT   Patient Name: Joseph Ortega MRN: [id number] DOB:Aug 24, 1955, 67 y.o., male Today's Date: 01/31/2023  END OF SESSION:  PT End of Session - 01/31/23 1057     Visit Number 5    Number of Visits 9    Date for PT Re-Evaluation 02/28/23    Authorization Type [employer]    Authorization Time Period 9 visits approved from 12/20/22-02/28/23    Progress Note Due on Visit 10    PT Start Time 1058    PT Stop Time 1147    PT Time Calculation (min) 49 min    Equipment Utilized During Treatment Gait belt    Activity Tolerance Patient tolerated treatment well    Behavior During Therapy WFL for tasks assessed/performed               Past Medical History:  Diagnosis Date   Anxiety    Arthritis    Depression    Diabetes mellitus without complication (HCC)    Headache    Hypertension    Hypothyroidism    Sleep apnea    wears CPAP   Stroke (HCC)    1 stroke 2/24, and 2 strokes on 03/09/21   Thyroid disease    Past Surgical History:  Procedure Laterality Date   ANTERIOR CERVICAL DECOMP/DISCECTOMY FUSION N/A 11/23/2022   Procedure: Anterior Cervical Decompression Fusion  Cervical four-five;  Surgeon: Lisbeth Renshaw, MD;  Location: [facility] OR;  Service: Neurosurgery;  Laterality: N/A;   GROIN DEBRIDEMENT Left    ingrown hair, possibly abscesses, surgery to treat infection   HERNIA REPAIR Left    inguinal   IR ANGIO INTRA EXTRACRAN SEL COM CAROTID INNOMINATE BILAT MOD SED  03/09/2021   IR ANGIO VERTEBRAL SEL VERTEBRAL BILAT MOD SED  03/09/2021   IR US GUIDE VASC ACCESS RIGHT  03/09/2021   LOOP RECORDER INSERTION N/A 05/07/2022   Procedure: LOOP RECORDER INSERTION;  Surgeon: Lanier Prude, MD;  Location: [facility] INVASIVE [facility];  Service: Cardiovascular;  Laterality: N/A;   Patient Active Problem List   Diagnosis Date Noted   Cervical stenosis of spine 11/23/2022   Cervicalgia 08/15/2022   Hyperlipidemia 05/07/2022   Right middle  cerebral artery stroke (HCC) 05/07/2022   Stroke (cerebrum) (HCC) 05/02/2022   ICH (intracerebral hemorrhage) (HCC) 03/17/2021   Hypothyroidism 03/12/2021   OSA (obstructive sleep apnea) 03/12/2021   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   Uncontrolled type 2 diabetes mellitus with hyperglycemia (HCC) 03/12/2021   Redness of both eyes 03/12/2021   Morbid obesity (HCC) 03/11/2021   Essential hypertension 03/11/2021   Tobacco abuse 03/11/2021   Herpes zoster with complication    SAH (subarachnoid hemorrhage) (HCC) 03/08/2021    PCP: None at this time, needs info to get setup with a new one  REFERRING PROVIDER: Lisbeth Renshaw, MD  REFERRING DIAG: [id number] (ICD-10-CM) - Spinal stenosis, cervical region  THERAPY DIAG:  Muscle weakness (generalized)  Other abnormalities of gait and mobility  Unsteadiness on feet  Hemiplegia and hemiparesis following cerebral infarction affecting left non-dominant side [facility])  Rationale for Evaluation and Treatment: Rehabilitation  ONSET DATE: 12/10/2022  SUBJECTIVE:  SUBJECTIVE STATEMENT:  "Finally got over the cold I had", otherwise pt reports no acute changes or falls. Pt visited Dr and they said that anterior neck is healing well. Tried to find a PCP, one could get him in starting in December but is still looking for one that will bring him in earlier. Pt wants more exercises for strength in L shoulder.   Hand dominance: Right  PERTINENT HISTORY:  PMH includes Anxiety, Arthritis, Depression, Diabetes mellitus, Headache, HTN, Hypothyroidism, Sleep apnea, Stroke (left hemi).  PAIN:  Are you having pain?  "Not really, just normal aches and pains"  PRECAUTIONS: Fall and Other: cervical  No ROM restrictions and no brace per patient; does have 20-25#  lifting restriction and no lifting overhead  PATIENT GOALS: "get more independent"  NEXT MD VISIT: sees Dr. Conchita Paris again 11/20  OBJECTIVE:  Note: Objective measures were completed at Evaluation unless otherwise noted.  DIAGNOSTIC FINDINGS:  MRI Cervical Spine 09/02/22 prior to surgery on 11/23/22 IMPRESSION: 1. At C4-5 there is a broad-based disc bulge with a broad central disc protrusion with mass effect on the ventral cervical spinal cord. Severe spinal stenosis. Bilateral uncovertebral degenerative changes. Severe bilateral foraminal stenosis. 2. Cervical spine spondylosis as described above. 3. No acute osseous injury of the cervical spine.     TODAY'S TREATMENT:   Ther Act  Vitals:   01/31/23 1110  BP: (!) 153/69  Pulse: (!) 57   Assessed BP (see above) and systolic BP elevated but within limits for PT   To assess STGs  [facility] PT Assessment - 01/31/23 1122       Standardized Balance Assessment   10 Meter Walk =32.31ft over 21.96 seconds= 1.49 ft/sec      Timed Up and Go Test   Normal TUG (seconds) 33.5   rollator, CGA            TUG Trial 1 38.46 sec rollator CGA Trial 2 33.50 sec rollator CGA, verbally cued for faster mobility Pt is largely limited by ability to put LUE on and off rollator in a fast manner, taking >7 seconds for each  TherEx Seated pulleys for L shoulder flexion x 4 minutes w/ 5 second hold at maximal L shoulder flexion Seated pulleys for L shoulder ER x4 minutes w/ 5 second hold at maximal L shoulder ER  PATIENT EDUCATION:  Education details: continue HEP, potential to get pulley set up for home if safe to over door, outcome measures and STG results Person educated: Patient Education method: Explanation and Demonstration Education comprehension: verbalized understanding, returned demonstration, and needs further education  HOME EXERCISE PROGRAM:  To be reviewed from previous POC and revised as appropriate [id number]   - Supine  Shoulder Flexion with Dowel  - 1 x daily - 7 x weekly - 3 sets - 10 reps - Supine Shoulder External Rotation with Dowel at 20 Degrees of Abduction  - 1 x daily - 7 x weekly - 3 sets - 10 reps - Seated Scapular Retraction  - 1 x daily - 7 x weekly - 3 sets - 10 reps - Seated Upper Trapezius Stretch  - 1 x daily - 7 x weekly - 3 sets - 10-20 second hold   ASSESSMENT:  CLINICAL IMPRESSION:  Emphasis of skilled PT session on assessing STGs and progressing LUE mobility. This date, pt has met 1/3 STGs, is progressing on 1/3 STGs, and has not met 1/3 STGs. Pt reports good adherence to HEP and wants progressions for UE ROM  and strengthening. Pt with improved gait speed to 1.49 ft/sec but regressed on TUG to 33.50 sec w/ rollator. Pt tolerated pulley activities well, reporting a good stretch with LUE flexion. Continue POC.     OBJECTIVE IMPAIRMENTS: Abnormal gait, decreased activity tolerance, decreased balance, decreased coordination, decreased endurance, decreased mobility, difficulty walking, decreased ROM, decreased strength, impaired perceived functional ability, impaired UE functional use, postural dysfunction, and pain.   ACTIVITY LIMITATIONS: carrying, lifting, bending, standing, squatting, stairs, and transfers  PARTICIPATION LIMITATIONS: meal prep, cleaning, laundry, interpersonal relationship, driving, and community activity  PERSONAL FACTORS: Time since onset of injury/illness/exacerbation, Transportation, and 3+ comorbidities:     Anxiety, Arthritis, Depression, Diabetes mellitus, Headache, HTN, Hypothyroidism, Sleep apnea, Stroke (left hemi).are also affecting patient's functional outcome.   REHAB POTENTIAL: Good  CLINICAL DECISION MAKING: Stable/uncomplicated  EVALUATION COMPLEXITY: Moderate   GOALS: Goals reviewed with patient? Yes  SHORT TERM GOALS: Target date: 01/20/2023   Pt will be independent with initial review of HEP for improved strength, balance, transfers and  gait. Baseline: pt reports current HEP is going well, "its getting stretched pretty good" about his L shoulder Goal status: MET  2.  Pt will improve normal TUG to less than or equal to 25 seconds for improved functional mobility and decreased fall risk. Baseline: 31.28 sec with rollator (10/3), 33.50 sec w/ rollator CGA (01/31/23) Goal status: NOT MET  3.  Pt will improve gait velocity to at least 1.5 ft/sec for improved gait efficiency and performance at mod I level   Baseline: 1.35 ft/sec with rollator mod I (10/3); 1.49 ft/sec (11/14) Goal status:  PROGRESSING   LONG TERM GOALS: Target date: 02/17/2023   Pt will be independent with final review of HEP for improved strength, balance, transfers and gait. Baseline:  Goal status: INITIAL  2.  Pt will improve 5 x STS to less than or equal to 15 seconds to demonstrate improved functional strength and transfer efficiency.  Baseline: 17.22 RUE (10/3) Goal status: INITIAL  3.  Pt will improve normal TUG to less than or equal to 25 seconds for improved functional mobility and decreased fall risk. Baseline: 31.28 sec with rollator (10/3); 33.50 sec w/ rollator CGA (01/31/23) Goal status: REVISED  4.  Pt will improve gait velocity to at least 1.75 ft/sec for improved gait efficiency and performance at mod I level   Baseline: 1.35 ft/sec with rollator mod I (10/3); 1.49 ft/sec (11/14) Goal status: INITIAL  5.  Pt will improve Berg score to 28/56 for improved standing balance and decreased fall risk  Baseline: 22/56 (01/17/23) Goal status: INITIAL     PLAN:  PT FREQUENCY: 1x/week  PT DURATION:  8 sessions  PLANNED INTERVENTIONS: Therapeutic exercises, Therapeutic activity, Neuromuscular re-education, Balance training, Gait training, Patient/Family education, Self Care, Joint mobilization, Stair training, Orthotic/Fit training, DME instructions, Dry Needling, Electrical stimulation, Cryotherapy, Moist heat, Taping, Manual therapy, and  Re-evaluation  PLAN FOR NEXT SESSION: review prior HEP and revise as appropriate for LUE and BLE strengthening, mobility, balance, endurance; LLE NMR, did we get OT referral?   Beverely Low, SPT  01/31/2023, 1:11 PM

## 2023-01-31 NOTE — Progress Notes (Signed)
  Subjective:  Patient ID: Joseph Ortega, male    DOB: 02/10/1956,  MRN: [id number]  Chief Complaint  Patient presents with   [facility]    [facility] patient has no concerns at this time.    67 y.o. male presents with the above complaint. History confirmed with patient.  Last nail debridement was helpful.  He notes improvement in his blood sugar no other new issues  Objective:  Physical Exam: warm, good capillary refill, no trophic changes or ulcerative lesions, normal DP and PT pulses, and venous stasis dermatitis noted. Left Foot: dystrophic yellowed discolored nail plates with subungual debris Right Foot: dystrophic yellowed discolored nail plates with subungual debris   Assessment:   1. Pain due to onychomycosis of toenails of both feet      Plan:  Patient was evaluated and treated and all questions answered.  Discussed the etiology and treatment options for the condition in detail with the patient.   Recommended debridement of the nails today. Sharp and mechanical debridement performed of all painful and mycotic nails today. Nails debrided in length and thickness using a nail nipper to level of comfort. Discussed treatment options including appropriate shoe gear. Follow up as needed for painful nails.    Return in about 4 months (around 05/29/2023) for at risk diabetic foot care.

## 2023-02-07 ENCOUNTER — Ambulatory Visit: Payer: Medicare HMO | Admitting: Physical Therapy

## 2023-02-11 ENCOUNTER — Ambulatory Visit (HOSPITAL_COMMUNITY)
Admission: EM | Admit: 2023-02-11 | Discharge: 2023-02-11 | Disposition: A | Discharge status: Home / Self Care | Payer: Medicare HMO | Attending: Psychiatry | Admitting: Psychiatry

## 2023-02-11 DIAGNOSIS — F32A Depression, unspecified: Secondary | ICD-10-CM | POA: Diagnosis not present

## 2023-02-11 DIAGNOSIS — Z79899 Other long term (current) drug therapy: Secondary | ICD-10-CM | POA: Diagnosis not present

## 2023-02-11 DIAGNOSIS — R45851 Suicidal ideations: Secondary | ICD-10-CM | POA: Insufficient documentation

## 2023-02-11 DIAGNOSIS — R5383 Other fatigue: Secondary | ICD-10-CM | POA: Insufficient documentation

## 2023-02-11 DIAGNOSIS — Z8673 Personal history of transient ischemic attack (TIA), and cerebral infarction without residual deficits: Secondary | ICD-10-CM | POA: Insufficient documentation

## 2023-02-11 DIAGNOSIS — F4323 Adjustment disorder with mixed anxiety and depressed mood: Secondary | ICD-10-CM | POA: Insufficient documentation

## 2023-02-11 DIAGNOSIS — Z630 Problems in relationship with spouse or partner: Secondary | ICD-10-CM | POA: Insufficient documentation

## 2023-02-11 NOTE — BH Assessment (Signed)
Comprehensive Clinical Assessment (CCA) Note  02/11/2023 Joseph [place]tega [id number]  Disposition: Per Joseph Gambles, NP outpatient treatment is recommended.  Patient to f/u with Dr. Tomasa Ortega for med management, as scheduled.  Referral information provided for [facility], as they offer in home therapy for seniors.  The patient demonstrates the following risk factors for suicide: Chronic risk factors for suicide include: psychiatric disorder of Depressive Disorder Unspecified . Acute risk factors for suicide include: family or marital conflict and social withdrawal/isolation. Protective factors for this patient include: positive social support, positive therapeutic relationship, responsibility to others (children, family), and hope for the future. Considering these factors, the overall suicide risk at this point appears to be low. Patient is appropriate for outpatient follow up.  Patient is a 67 year old male with a history of Depressive Disorder Unspecified who presents voluntarily via [facility] to [facility] for assessment.  Patient has some difficulty with ambulating, provided with a walker. Patient reports worsening depression for the past few months, mostly related to mounting stress with day to day functioning.  He reports he and his wife have not been getting along, stating they have had issues "since we married 45 years ago."  They are currently struggling with cooking, cleaning and various chores, stating they both have mobility issues.  He has asked his wife to consider hiring out help for these tasks, however his wife tells him she does not want to spend the money on help.  Patient states they got into an argument yesterday, "over a can of soup."  He shares he was putting up groceries, and began to feel exhausted of trying to reach a top shelf.  His frustration was triggering for his wife and they began arguing. Patient states his wife "can be harsh," reporting she "called me every kind  of name for hours."  He began feeling suicidal at one point, stating he had thoughts about overdosing on sleeping pills.  He states he held a knife to his wrist a month ago, and did not attempt when he felt God spoke to him to tell him, "I died for you so that you could live."  He denies hx of suicide attempts.  He denies current SI, plan or intent stating he can maintain his safety.  Patient states he is hopeful that he and his wife will be able to move back home to [place] to be near their sons for additional support soon.  The housing market is currently a barrier due to house prices.  Patient appears hopeful and future oriented, as he is looking forward this eventual move.  Pateint reports he is established with outpatient psychiatry with [facility]/ Dr [name], and his last visit was this week.  Patient is not seeing a therapist, however mentions he and his wife would like to find therapists.  Patient denies HI and AVH.     Chief Complaint:  Chief Complaint  Patient presents with   Suicidal   Visit Diagnosis: Depressive Disorder Unspecified    CCA Screening, Triage and Referral (STR)  Patient Reported Information How did you hear about [place]? Legal System  What Is the Reason for Your Visit/Call Today? Joseph Ortega is a 67 year old male who presents to [facility] escorted by [employer]. Pt has some difficulty with ambulating, provided with a walker. Pt reports SI with a plan to overdose on sleeping pills, lastnight after arguing with his wife. Pt states this has happened before when he has felt suicidal  when he is not getting along with his wife. He reports he is established with outpatient psychiatry with [facility]/ Dr [name], and his last visit was this week. He reports seeing his psychiatrist once every few months. He states he is not established with outpatient therapy.Pt denies HI and AVH. He denies past suicide attempts. He denies past psychiatric  hospitalizations. Pt states he does not want to go home with his wife and would rather stay here.  How Long Has This Been Causing You Problems? <Week  What Do You Feel Would Help You the Most Today? Treatment for Depression or other mood problem   Have You Recently Had Any Thoughts About Hurting Yourself? Yes  Are You Planning to Commit Suicide/Harm Yourself At This time? Yes   Flowsheet Row ED from 02/11/2023 in [facility] Admission (Discharged) from 11/23/2022 in [facility]  [facility] Pre-Admission Testing 62 from 11/15/2022 in [facility] PREADMISSION TESTING  C-SSRS RISK CATEGORY Moderate Risk No Risk No Risk       Have you Recently Had Thoughts About Hurting Someone [name]? No  Are You Planning to Harm Someone at This Time? No  Explanation: N/A   Have You Used Any Alcohol or Drugs in the Past 24 Hours? No  What Did You Use and How Much? No data recorded  Do You Currently Have a Therapist/Psychiatrist? No data recorded Name of Therapist/Psychiatrist: Name of Therapist/Psychiatrist: Dr. Tomasa Ortega - med management   Have You Been Recently Discharged From Any Office Practice or Programs? No  Explanation of Discharge From Practice/Program: N/A     CCA Screening Triage Referral Assessment Type of Contact: Face-to-Face  Telemedicine Service Delivery:   Is this Initial or Reassessment?   Date Telepsych consult ordered in [facility]:    Time Telepsych consult ordered in [facility]:    Location of Assessment: [facility] Assessment Services  Provider Location: [facility] Assessment Services   Collateral Involvement: Wife spoke with provider   Does Patient Have a Automotive engineer Guardian? No  Legal Guardian Contact Information: N/A  Copy of Legal Guardianship Form: -- (N/A)  Legal Guardian Notified of Arrival: -- (N/A)  Legal Guardian Notified of Pending Discharge: -- (N/A)  If Minor and Not Living with Parent(s),  Who has Custody? N/A  Is CPS involved or ever been involved? Never  Is APS involved or ever been involved? Never   Patient Determined To Be At Risk for Harm To Self or Others Based on Review of Patient Reported Information or Presenting Complaint? No  Method: -- (N/A, no HI)  Availability of Means: -- (N/A, no HI)  Intent: -- (N/A, no HI)  Notification Required: -- (N/A, no HI)  Additional Information for Danger to Others Potential: -- (N/A, no HI)  Additional Comments for Danger to Others Potential: N/A, no HI  Are There Guns or Other Weapons in Your Home? No  Types of Guns/Weapons: N/A  Are These Weapons Safely Secured?                            -- (N/A)  Who Could Verify You Are Able To Have These Secured: N/A  Do You Have any Outstanding Charges, Pending Court Dates, Parole/Probation? N/A  Contacted To Inform of Risk of Harm To Self or Others: -- (N/A, no HI)    Does Patient Present under Involuntary Commitment? No    [place] of Residence:  [place]   Patient Currently Receiving the Following Services: Medication Management   Determination of Need: Urgent (48 hours)   Options For Referral: Inpatient Hospitalization; [facility] Urgent Care; Medication Management; Outpatient Therapy     CCA Biopsychosocial Patient Reported Schizophrenia/Schizoaffective Diagnosis in Past: No   Strengths: Patient is open to treatment recommendations, he has some family support   Mental Health Symptoms Depression:   Difficulty Concentrating; Hopelessness; Irritability   Duration of Depressive symptoms:  Duration of Depressive Symptoms: Greater than two weeks   Mania:   Irritability; Racing thoughts   Anxiety:    Worrying; Tension   Psychosis:   None   Duration of Psychotic symptoms:    Trauma:   None   Obsessions:   None   Compulsions:   None   Inattention:   N/A   Hyperactivity/Impulsivity:   N/A   Oppositional/Defiant Behaviors:   N/A   Emotional  Irregularity:   Mood lability   Other Mood/Personality Symptoms:   NA    Mental Status Exam Appearance and self-care  Stature:   Average   Weight:   Average weight   Clothing:   Disheveled   Grooming:   Neglected   Cosmetic use:   None   Posture/gait:   Normal   Motor activity:   Not Remarkable   Sensorium  Attention:   Normal   Concentration:   Normal   Orientation:   X5   Recall/memory:   Normal   Affect and Mood  Affect:   Depressed   Mood:   Depressed   Relating  Eye contact:   Normal   Facial expression:   Responsive; Depressed   Attitude toward examiner:   Cooperative   Thought and Language  Speech flow:  Clear and Coherent   Thought content:   Appropriate to Mood and Circumstances   Preoccupation:   None   Hallucinations:   None   Organization:   Intact   Company secretary of Knowledge:   Average   Intelligence:   Average   Abstraction:   Normal   Judgement:   Fair   Dance movement psychotherapist:   Adequate   Insight:   Gaps   Decision Making:   Location manager   Social Functioning  Social Maturity:   Responsible   Social Judgement:   Normal   Stress  Stressors:   Relationship   Coping Ability:   Contractor Deficits:   Interpersonal; Self-control; Self-care   Supports:   Family     Religion: Religion/Spirituality Are You A Religious Person?: No How Might This Affect Treatment?: N/A  Leisure/Recreation: Leisure / Recreation Do You Have Hobbies?: No  Exercise/Diet: Exercise/Diet Do You Exercise?: No Have You Gained or Lost A Significant Amount of Weight in the Past Six Months?: No Do You Follow a Special Diet?: No Do You Have Any Trouble Sleeping?: No   CCA Employment/Education Employment/Work Situation: Employment / Work Academic librarian Situation: Retired Passenger transport manager has Been Impacted by Current Illness:  (N/A) Has Patient ever Been in Equities trader?:  No  Education: Education Is Patient Currently Attending School?: No Last Grade Completed: 12 Did You Product manager?: No Did You Have An Individualized Education Program (IIEP): No Did You Have Any Difficulty At School?: No Patient's Education Has Been Impacted by Current Illness: No   CCA Family/Childhood History Family and Relationship History: Family history Marital status: Married Number of Years Married:  (NA) What types of issues is patient dealing with in the relationship?:  Patient reports he and wife have not been getting along for many years.  More recently, they argue about patient cooking and cleaning vs hiring help around the house, especially since wife is wheelchair bound. Additional relationship information: NA Does patient have children?: Yes How many children?: 3 How is patient's relationship with their children?: 2 grown sons live in [place]. [place] and daughter , who is estranged 3 yrs now, loves down the road.  Childhood History:  Childhood History By whom was/is the patient raised?: Both parents Did patient suffer any verbal/emotional/physical/sexual abuse as a child?: No Did patient suffer from severe childhood neglect?: No Has patient ever been sexually abused/assaulted/raped as an adolescent or adult?: No Was the patient ever a victim of a crime or a disaster?: No Witnessed domestic violence?: No Has patient been affected by domestic violence as an adult?: No       CCA Substance Use Alcohol/Drug Use: Alcohol / Drug Use Pain Medications: See MAR Prescriptions: See MAR Over the Counter: See MAR History of alcohol / drug use?: No history of alcohol / drug abuse                         ASAM's:  Six Dimensions of Multidimensional Assessment  Dimension 1:  Acute Intoxication and/or Withdrawal Potential:      Dimension 2:  Biomedical Conditions and Complications:      Dimension 3:  Emotional, Behavioral, or Cognitive Conditions and Complications:      Dimension 4:  Readiness to Change:     Dimension 5:  Relapse, Continued use, or Continued Problem Potential:     Dimension 6:  Recovery/Living Environment:     ASAM Severity Score:    ASAM Recommended Level of Treatment:     Substance use Disorder (SUD)    Recommendations for Services/Supports/Treatments:    Discharge Disposition:    DSM5 Diagnoses: Patient Active Problem List   Diagnosis Date Noted   Cervical stenosis of spine 11/23/2022   Cervicalgia 08/15/2022   Hyperlipidemia 05/07/2022   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   Stroke (cerebrum) (HCC) 05/02/2022   ICH (intracerebral hemorrhage) (HCC) 03/17/2021   Hypothyroidism 03/12/2021   OSA (obstructive sleep apnea) 03/12/2021   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   Uncontrolled type 2 diabetes mellitus with hyperglycemia (HCC) 03/12/2021   Redness of both eyes 03/12/2021   Morbid obesity (HCC) 03/11/2021   Essential hypertension 03/11/2021   Tobacco abuse 03/11/2021   Herpes zoster with complication    SAH (subarachnoid hemorrhage) (HCC) 03/08/2021     Referrals to Alternative Service(s): Referred to Alternative Service(s):   Place:   Date:   Time:    Referred to Alternative Service(s):   Place:   Date:   Time:    Referred to Alternative Service(s):   Place:   Date:   Time:    Referred to Alternative Service(s):   Place:   Date:   Time:     Yetta Glassman, [facility]

## 2023-02-11 NOTE — Discharge Instructions (Addendum)
Please follow up with [name] to notify of admission.  Please follow up with current Neurologist.    Outpatient Therapy is recommended:   [facility] (Therapists come to you in your home).  Please call to ensure they take your insurance plan.  For more information, please contact Minna Merritts at: [phone] Main Office:  Phone: [phone]  Website: [email or web address]  You can also search for therapists that work with seniors by visiting: [email or web address].[email or web address]  It is imperative that you follow through with treatment recommendations within 5-7 days from the day of discharge to mitigate further risk to your safety and overall mental well-being.  A list of outpatient therapy and psychiatric providers for medication management has been provided below to get you started in finding the right provider for you.            [facility] [address] [place], [place], [address] [phone] phone (Medicare, Private insurance except Tricare, Maguayo Paderborn, and [facility])  [facility] [address] [place], [place], [address] [address] phone ([address], AmeriHealth [facility] [place], [address], [place], [place], Friday Health Plans, [address], BCBS Healthy [place], [place], [address], [place], [place], [place], [place], Tricare, [facility], [employer], Eli Lilly and Company)  [employer] [phone] W[address] Suite [place], [place], [address] [phone] phone [phone] phone [phone] fax  [facility] [address] [place], [place], [address] [phone] phone (Call to confirm insurance coverage) Consultation & Support Services     o Drop-In Hours: 1:00 PM to 5:00 PM     o Days: Monday - Thursday  Crisis Services (24/7)   [facility] [address] [place], [place], [address] [phone] phone ([address] [place], [place], [place] Medicaid, [place] and  [place], [facility])      [facility] [address] [place], [place], [address] [phone] phone [email or web address]  (to complete the intake form and upload ID and insurance cards)  [facility] [address] [place], [place], [address] [phone] phone ([address], [address], [place], [address] [employer], [place], [place], [place], [facility], [place], and certain Medicaid plans)  [facility] [phone] N. [address], [address] [place], [place], [address] [phone] phone [phone] fax (Medicaid, Medicare, Self-pay, call about other insurance coverage)  [facility] (age 88+) [address] [place], [place], [address] [phone] hone [phone] fax ([place], [address], [place], [place], [place], [address], [place], [place], [place], [place], certain [employer], [facility], UMR)  [facility] [address] [place], [place], [address] [phone] phone (Medicare, Medicaid, Artemio Aly, call about other insurance coverage)  [facility] [address] [place], [place], [address] [phone] phone [phone] fax (Call [phone] to see what insurance is accepted) Archer Asa, MD specializes in geropsych)  [facility], [facility]  (medication management only) [address], [address] [place], [place], [address] [phone] phone [phone] fax ([address] Medicaid, [place], [place], [place], [place], [place], [place], [place])  [facility] (medication management only) [address] [place], [place], [address] [phone] phone [phone] fax ([address], Medicare, [place], [place], Tricare [place])  [facility] [address] [place], [place], [address] [phone] phone [phone] fax ([address], [place], [place], [place], [place], [facility],  [place] Medicaid/ Health Choice)  [facility] [place], [employer]. [address] [place], [place], [address] [phone] phone [phone] fax (Medicare, Medicaid, BCBS)  [facility] [address] [place], [place] [address] [phone] phone ([address], [place], [place], [employer], [place], Medicare, [place], [facility]) Does genetic testing for medications; does transcranial magnetic stimulation along with basic services)  [facility] [address] [place], [place], [address] [phone] phone (Call about insurance coverage)  [facility] [address] [place], [place], [address] [phone] phone [phone] fax (Call about insurance coverage)  [facility] [address] [place], [place], [address] [phone] phone [phone] fax (Call about insurance coverage)  [employer] [phone] N. [address], [place], [address] [phone] phone (Medicaid, Tricare, [place], [place], [place])  [employer] [address] [place], [place], [address] [phone] phone (Medicaid, Medicare, call about other insurance coverage)  [facility] [address] [place], [place], [address] [phone] phone [phone] fax (Medicaid, Medicare, Tricare, call about other insurance coverage)  [facility] [address] [place], [address] [phone] phone ([address], [address], [place], [place], [place], [place] types - Alliance, Secretary/administrator, Partners, [place], [place] Health Choice, Healthy [place], [place], [place], and Complete)  [facility] [address], [address] [place], [place], [address] [phone] phone Completely online treatment platform Contact: Personal assistant - [employer] Specialist [phone] phone [phone] fax ([address] [place], [place], Friday Health Plan, [place], [place], [place], [place], [place], [place])

## 2023-02-11 NOTE — Discharge Summary (Signed)
Joseph Ortega to be D/C'd Home per NP order. An After Visit Summary was printed and given to the patient. Patient escorted out, and D/C home via BHRT.  [name]  02/11/2023 5:30 PM

## 2023-02-11 NOTE — Progress Notes (Signed)
   02/11/23 1406  [facility] Triage Screening (Walk-ins at [facility] only)  How Did You Hear About [place]? Legal System  What Is the Reason for Your Visit/Call Today? Joseph Ortega is a 67 year old male who presents to [facility] escorted by [employer]. Pt has some difficulty with ambulating, provided with a walker. Pt reports SI with a plan to overdose on sleeping pills, lastnight after arguing with his wife. Pt states this has happened before when he has felt suicidal when he is not getting along with his wife. He reports he is established with outpatient psychiatry with [facility]/ Dr [name], and his last visit was this week. He reports seeing his psychiatrist once every few months. He states he is not established with outpatient therapy.Pt denies HI and AVH. He denies past suicide attempts. He denies past psychiatric hospitalizations. Pt states he does not want to go home with his wife and would rather stay here.  How Long Has This Been Causing You Problems? <Week  Have You Recently Had Any Thoughts About Hurting Yourself? Yes  How long ago did you have thoughts about hurting yourself? lastnight  Are You Planning to Commit Suicide/Harm Yourself At This time? Yes  Have you Recently Had Thoughts About Hurting Someone [name]? No  Are You Planning To Harm Someone At This Time? No  Physical Abuse Denies  Verbal Abuse Denies  Sexual Abuse Denies  Exploitation of patient/patient's resources Denies  Self-Neglect Denies  Possible abuse reported to:  (none)  Are you currently experiencing any auditory, visual or other hallucinations? No  Have You Used Any Alcohol or Drugs in the Past 24 Hours? No  Do you have any current medical co-morbidities that require immediate attention? No  Clinician description of patient physical appearance/behavior: calm, cooperative, uses walker to ambulate  What Do You Feel Would Help You the Most Today? Treatment for Depression or other mood problem  If access to [facility] was not available, would you have sought care in the Emergency Department? No  Determination of Need Urgent (48 hours)  Options For Referral Inpatient Hospitalization;BH Urgent Care;Medication Management;Outpatient Therapy  Determination of Need filed? Yes

## 2023-02-11 NOTE — ED Provider Notes (Signed)
Behavioral Health Urgent Care Medical Screening Exam  Patient Name: Joseph Ortega MRN: [id number] Date of Evaluation: 02/11/23 Chief Complaint:  Suicidal ideations last night after argument with spouse Diagnosis:  Final diagnoses:  Adjustment disorder with mixed anxiety and depressed mood    History of Present illness: Joseph Ortega is a 67 y.o. male patient [facility] as a walk in accompanied by [employer] with complaints of Suicidal ideations last night after argument with spouse  Joseph Ortega, 71 y.o., male patient seen face to face by this provider , [name] [facility] and [name]  and chart reviewed on 02/11/23.   Patient reports a past psychiatric history of depression and anxiety.  He has psychiatric medication management in place with [name].  He is prescribed Cymbalta and BuSpar.  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  He currently has no therapy services in place.  He denies any substance use.  He lives in the home with his spouse.  Per [name] [name], "Patient is a 67 year old male with a history of Depressive Disorder Unspecified who presents voluntarily via [employer] to Behavioral Health Urgent Care for assessment.  Patient has some difficulty with ambulating, provided with a walker. Patient reports worsening depression for the past few months, mostly related to mounting stress with day to day functioning.  He reports he and his wife have not been getting along, stating they have had issues "since we married 45 years ago."  They are currently struggling with cooking, cleaning and various chores, stating they both have mobility issues.  He has asked his wife to consider hiring out help for these tasks, however his wife tells him she does not want to spend the money on help.  Patient states they got into an argument yesterday, "over a can of soup."  He shares he was putting up groceries, and began to feel exhausted of trying to reach a top shelf.  His frustration was  triggering for his wife and they began arguing. Patient states his wife "can be harsh," reporting she "called me every kind of name for hours."  He began feeling suicidal at one point, stating he had thoughts about overdosing on sleeping pills.  He states he held a knife to his wrist a month ago, and did not attempt when he felt God spoke to him to tell him, "I died for you so that you could live."  He denies hx of suicide attempts.  He denies current SI, plan or intent stating he can maintain his safety.  Patient states he is hopeful that he and his wife will be able to move back home to [place] to be near their sons for additional support soon.  The housing market is currently a barrier due to house prices.  Patient appears hopeful and future oriented, as he is looking forward this eventual move.  Pateint reports he is established with outpatient psychiatry with [facility]/ Dr [name], and his last visit was this week.  Patient is not seeing a therapist, however mentions he and his wife would like to find therapists.  Patient denies HI and AVH.    During evaluation Joseph Ortega is observed sitting in the assessment room in no acute distress.  He is disheveled and is malodorous.  He is dependent to a walker for ambulation.  He admits admits to having left-sided weakness and does not move his left arm throughout the assessment.  He is currently enrolled in physical  therapy due to recent CVA.  He is alert/oriented x 4, cooperative, calm, and attentive.  He has normal speech and behavior.  He is pleasant upon approach.  He endorses depression in the context of arguing with his wife.  His focus throughout the assessment is in the context of their disagreements and noncommunication.  He states multiple times that he does need therapy but his spouse does as well.  He denies any concerns with appetite or sleep.  Overall he states his mood is good until he enters into a verbal altercation with his  spouse.  He is denying suicidal/homicidal ideations.  He is verbally contracting for safety.  He denies any access to firearms/weapons.  He admits that when he argues with his spouse he often will make suicidal comments even though he does not mean it.  He does not in the context of frustration/anger.  He identifies multiple protective factors including going out to eat with his spouse for Thanksgiving, and an upcoming move to [place] where 2 of his sons and 11 grandchildren live.  He is looking forward to spending time with family.  He is saddened that his daughter who lives in the area no longer speaks to him or his spouse.  He also struggles that he is no longer to able do the physical things that he used to do due to his history of stroke.  He is in agreement to follow-up with outpatient services.  States at Dr. [name] office they have recently added a therapist.  He denies homicidal ideations.  He denies auditory/visual hallucinations.  He does not appear manic, psychotic, or paranoid.  He does not appear to be responding to internal/external stimuli.  At this time [name] is educated and verbalizes understanding of mental health resources and other crisis services in the community. He  is instructed to call 911 and present to the nearest emergency room should he experience any suicidal/homicidal ideation, auditory/visual/hallucinations, or detrimental worsening of his mental health condition.   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  States he has a history of making suicidal comments.  States she has encouraged patient to participate in therapy in the past and he has refused.  He also refuses for her to manage his medications.  She has no immediate safety concerns with patient as she feels that she has dealt with his depression and mood instability for years.  She does believe that after  multiple strokes he does appear to be get more irritable at times.  She has removed all firearms that are in the home.  Patient has no access to any type of weapon.  She feels encouraged the patient has agreed with this Clinical research associate to participate in therapy.  She is in agreement that patient needs to be discharged home via GPD who brought patient to the urgent care.  She agrees to follow-up with Dr.Edehand patient's neurologist this week.  Patient transported home via Patent examiner.    Flowsheet Row ED from 02/11/2023 in [facility] Admission (Discharged) from 11/23/2022 in [facility]  [facility] Pre-Admission Testing 62 from 11/15/2022 in [facility] PREADMISSION TESTING  C-SSRS RISK CATEGORY Moderate Risk No Risk No Risk       Psychiatric Specialty Exam  Presentation  General Appearance:Casual  Eye Contact:Good  Speech:Clear and Coherent; Normal Rate  Speech Volume:Normal  Handedness:Right   Mood and Affect  Mood:Anxious; Depressed  Affect:Congruent   Thought Process  Thought Processes:Coherent  Descriptions of Associations:Intact  Orientation:Full (Time, Place and Person)  Thought Content:Logical    Hallucinations:None  Ideas of Reference:None  Suicidal Thoughts:No  Homicidal Thoughts:No   Sensorium  Memory:Immediate Good; Remote Good; Recent Good  Judgment:Fair  Insight:Fair   Executive Functions  Concentration:Good  Attention Span:Good  Recall:Good  Fund of Knowledge:Good  Language:Good   Psychomotor Activity  Psychomotor Activity:Normal   Assets  Assets:Physical Health; Resilience; Social Support; Manufacturing systems engineer; Desire for Improvement; Financial Resources/Insurance; Housing; Leisure Time   Sleep  Sleep:Good  Number of hours: No data recorded  Physical Exam: Physical Exam Vitals and nursing note reviewed.  Constitutional:      Appearance: Normal appearance.   HENT:     Head: Normocephalic.  Eyes:     Conjunctiva/sclera: Conjunctivae normal.  Cardiovascular:     Rate and Rhythm: Normal rate.  Pulmonary:     Effort: Pulmonary effort is normal. No respiratory distress.  Musculoskeletal:        General: Normal range of motion.     Cervical back: Normal range of motion.  Neurological:     Mental Status: He is alert and oriented to person, place, and time.  Psychiatric:        Attention and Perception: Attention and perception normal.        Mood and Affect: Mood is anxious and depressed.        Speech: Speech normal.        Behavior: Behavior normal. Behavior is cooperative.        Thought Content: Thought content normal.        Cognition and Memory: Cognition normal.        Judgment: Judgment normal.    Review of Systems  Constitutional: Negative.   HENT: Negative.    Eyes: Negative.   Respiratory: Negative.    Cardiovascular: Negative.   Musculoskeletal: Negative.   Skin: Negative.   Neurological: Negative.   Psychiatric/Behavioral:  Positive for depression. The patient is nervous/anxious.    Blood pressure (!) 176/94, pulse 97, temperature 98.9 F (37.2 C), temperature source Oral, resp. rate 20. There is no height or weight on file to calculate BMI.  Musculoskeletal: Strength & Muscle Tone: within normal limits Gait & Station: normal Patient leans: N/A   [facility] MSE Discharge Disposition for Follow up and Recommendations: Based on my evaluation the patient does not appear to have an emergency medical condition and can be discharged with resources and follow up care in outpatient services for Medication Management and Individual Therapy  Discharge patient  Encouraged patient to follow-up with neurologist and Dr. Eben Burow  office for medication management.  Provided outpatient psychiatric resources for therapy.    Ardis Hughs, NP 02/11/2023, 4:41 PM

## 2023-02-17 LAB — CUP PACEART REMOTE DEVICE CHECK
Date Time Interrogation Session: 20241201020414
Implantable Pulse Generator Implant Date: 20240219
Pulse Gen Serial Number: 511022381

## 2023-02-19 ENCOUNTER — Ambulatory Visit (INDEPENDENT_AMBULATORY_CARE_PROVIDER_SITE_OTHER): Payer: Self-pay

## 2023-02-19 DIAGNOSIS — I639 Cerebral infarction, unspecified: Secondary | ICD-10-CM

## 2023-02-21 ENCOUNTER — Emergency Department (HOSPITAL_COMMUNITY): Payer: Medicare HMO

## 2023-02-21 ENCOUNTER — Ambulatory Visit: Payer: Medicare HMO | Admitting: Physical Therapy

## 2023-02-21 ENCOUNTER — Other Ambulatory Visit: Payer: Self-pay

## 2023-02-21 ENCOUNTER — Inpatient Hospital Stay (HOSPITAL_COMMUNITY)
Admission: EM | Admit: 2023-02-21 | Discharge: 2023-02-25 | DRG: 083 | Disposition: A | Discharge status: Rehabilitation Facility | Payer: Medicare HMO | Attending: Internal Medicine | Admitting: Internal Medicine

## 2023-02-21 DIAGNOSIS — W010XXA Fall on same level from slipping, tripping and stumbling without subsequent striking against object, initial encounter: Secondary | ICD-10-CM | POA: Diagnosis present

## 2023-02-21 DIAGNOSIS — Z79899 Other long term (current) drug therapy: Secondary | ICD-10-CM

## 2023-02-21 DIAGNOSIS — S062XAA Diffuse traumatic brain injury with loss of consciousness status unknown, initial encounter: Principal | ICD-10-CM | POA: Diagnosis present

## 2023-02-21 DIAGNOSIS — W19XXXA Unspecified fall, initial encounter: Principal | ICD-10-CM

## 2023-02-21 DIAGNOSIS — I169 Hypertensive crisis, unspecified: Secondary | ICD-10-CM | POA: Diagnosis present

## 2023-02-21 DIAGNOSIS — I4510 Unspecified right bundle-branch block: Secondary | ICD-10-CM | POA: Diagnosis present

## 2023-02-21 DIAGNOSIS — R9389 Abnormal findings on diagnostic imaging of other specified body structures: Secondary | ICD-10-CM

## 2023-02-21 DIAGNOSIS — E039 Hypothyroidism, unspecified: Secondary | ICD-10-CM | POA: Diagnosis present

## 2023-02-21 DIAGNOSIS — S06341A Traumatic hemorrhage of right cerebrum with loss of consciousness of 30 minutes or less, initial encounter: Secondary | ICD-10-CM

## 2023-02-21 DIAGNOSIS — R32 Unspecified urinary incontinence: Secondary | ICD-10-CM | POA: Diagnosis present

## 2023-02-21 DIAGNOSIS — E1165 Type 2 diabetes mellitus with hyperglycemia: Secondary | ICD-10-CM | POA: Diagnosis present

## 2023-02-21 DIAGNOSIS — Z79890 Hormone replacement therapy (postmenopausal): Secondary | ICD-10-CM

## 2023-02-21 DIAGNOSIS — F32A Depression, unspecified: Secondary | ICD-10-CM | POA: Diagnosis present

## 2023-02-21 DIAGNOSIS — R109 Unspecified abdominal pain: Secondary | ICD-10-CM

## 2023-02-21 DIAGNOSIS — Z7985 Long-term (current) use of injectable non-insulin antidiabetic drugs: Secondary | ICD-10-CM

## 2023-02-21 DIAGNOSIS — E876 Hypokalemia: Secondary | ICD-10-CM | POA: Diagnosis present

## 2023-02-21 DIAGNOSIS — I119 Hypertensive heart disease without heart failure: Secondary | ICD-10-CM | POA: Diagnosis present

## 2023-02-21 DIAGNOSIS — I69354 Hemiplegia and hemiparesis following cerebral infarction affecting left non-dominant side: Secondary | ICD-10-CM

## 2023-02-21 DIAGNOSIS — I619 Nontraumatic intracerebral hemorrhage, unspecified: Secondary | ICD-10-CM | POA: Diagnosis present

## 2023-02-21 DIAGNOSIS — L97929 Non-pressure chronic ulcer of unspecified part of left lower leg with unspecified severity: Secondary | ICD-10-CM | POA: Diagnosis present

## 2023-02-21 DIAGNOSIS — E785 Hyperlipidemia, unspecified: Secondary | ICD-10-CM | POA: Diagnosis present

## 2023-02-21 DIAGNOSIS — Z833 Family history of diabetes mellitus: Secondary | ICD-10-CM

## 2023-02-21 DIAGNOSIS — F419 Anxiety disorder, unspecified: Secondary | ICD-10-CM | POA: Diagnosis present

## 2023-02-21 DIAGNOSIS — I890 Lymphedema, not elsewhere classified: Secondary | ICD-10-CM | POA: Diagnosis present

## 2023-02-21 DIAGNOSIS — I160 Hypertensive urgency: Secondary | ICD-10-CM | POA: Diagnosis present

## 2023-02-21 DIAGNOSIS — Z23 Encounter for immunization: Secondary | ICD-10-CM

## 2023-02-21 DIAGNOSIS — I872 Venous insufficiency (chronic) (peripheral): Secondary | ICD-10-CM | POA: Diagnosis present

## 2023-02-21 DIAGNOSIS — B353 Tinea pedis: Secondary | ICD-10-CM | POA: Diagnosis present

## 2023-02-21 DIAGNOSIS — Z794 Long term (current) use of insulin: Secondary | ICD-10-CM

## 2023-02-21 DIAGNOSIS — Z7982 Long term (current) use of aspirin: Secondary | ICD-10-CM

## 2023-02-21 DIAGNOSIS — G4733 Obstructive sleep apnea (adult) (pediatric): Secondary | ICD-10-CM | POA: Diagnosis present

## 2023-02-21 DIAGNOSIS — G9389 Other specified disorders of brain: Secondary | ICD-10-CM | POA: Diagnosis present

## 2023-02-21 LAB — CBC WITH DIFFERENTIAL/PLATELET
Abs Immature Granulocytes: 0.07 10*3/uL (ref 0.00–0.07)
Basophils Absolute: 0.1 10*3/uL (ref 0.0–0.1)
Basophils Relative: 1 %
Eosinophils Absolute: 0.1 10*3/uL (ref 0.0–0.5)
Eosinophils Relative: 1 %
HCT: 46 % (ref 39.0–52.0)
Hemoglobin: 15.1 g/dL (ref 13.0–17.0)
Immature Granulocytes: 1 %
Lymphocytes Relative: 11 %
Lymphs Abs: 0.9 10*3/uL (ref 0.7–4.0)
MCH: 30.8 pg (ref 26.0–34.0)
MCHC: 32.8 g/dL (ref 30.0–36.0)
MCV: 93.7 fL (ref 80.0–100.0)
Monocytes Absolute: 0.5 10*3/uL (ref 0.1–1.0)
Monocytes Relative: 6 %
Neutro Abs: 6.7 10*3/uL (ref 1.7–7.7)
Neutrophils Relative %: 80 %
Platelets: 231 10*3/uL (ref 150–400)
RBC: 4.91 MIL/uL (ref 4.22–5.81)
RDW: 14.4 % (ref 11.5–15.5)
WBC: 8.3 10*3/uL (ref 4.0–10.5)
nRBC: 0 % (ref 0.0–0.2)

## 2023-02-21 LAB — COMPREHENSIVE METABOLIC PANEL
ALT: 10 U/L (ref 0–44)
AST: 25 U/L (ref 15–41)
Albumin: 3.8 g/dL (ref 3.5–5.0)
Alkaline Phosphatase: 148 U/L — ABNORMAL HIGH (ref 38–126)
Anion gap: 10 (ref 5–15)
BUN: 24 mg/dL — ABNORMAL HIGH (ref 8–23)
CO2: 25 mmol/L (ref 22–32)
Calcium: 9.6 mg/dL (ref 8.9–10.3)
Chloride: 105 mmol/L (ref 98–111)
Creatinine, Ser: 1.08 mg/dL (ref 0.61–1.24)
GFR, Estimated: >60 mL/min (ref >60)
Glucose, Bld: 284 mg/dL — ABNORMAL HIGH (ref 70–99)
Potassium: 4.1 mmol/L (ref 3.5–5.1)
Sodium: 140 mmol/L (ref 135–145)
Total Bilirubin: 1 mg/dL (ref <1.2)
Total Protein: 7.9 g/dL (ref 6.5–8.1)

## 2023-02-21 LAB — I-STAT CHEM 8, ED
BUN: 26 mg/dL — ABNORMAL HIGH (ref 8–23)
Calcium, Ion: 1.19 mmol/L (ref 1.15–1.40)
Chloride: 106 mmol/L (ref 98–111)
Creatinine, Ser: 1.1 mg/dL (ref 0.61–1.24)
Glucose, Bld: 292 mg/dL — ABNORMAL HIGH (ref 70–99)
HCT: 46 % (ref 39.0–52.0)
Hemoglobin: 15.6 g/dL (ref 13.0–17.0)
Potassium: 4 mmol/L (ref 3.5–5.1)
Sodium: 143 mmol/L (ref 135–145)
TCO2: 27 mmol/L (ref 22–32)

## 2023-02-21 LAB — ETHANOL: Alcohol, Ethyl (B): <10 mg/dL (ref <10)

## 2023-02-21 LAB — BRAIN NATRIURETIC PEPTIDE: B Natriuretic Peptide: 732.6 pg/mL — ABNORMAL HIGH (ref 0.0–100.0)

## 2023-02-21 LAB — MAGNESIUM: Magnesium: 2.1 mg/dL (ref 1.7–2.4)

## 2023-02-21 LAB — TROPONIN I (HIGH SENSITIVITY): Troponin I (High Sensitivity): 27 ng/L — ABNORMAL HIGH (ref <18)

## 2023-02-21 LAB — CK: Total CK: 117 U/L (ref 49–397)

## 2023-02-21 MED ORDER — CLEVIDIPINE BUTYRATE 0.5 MG/ML IV EMUL
0.0000 mg/h | INTRAVENOUS | Status: DC
  Administered 2023-02-22: 2 mg/h via INTRAVENOUS
  Filled 2023-02-21: qty 100

## 2023-02-21 MED ORDER — MORPHINE SULFATE (PF) 4 MG/ML IV SOLN
4.0000 mg | Freq: Once | INTRAVENOUS | Status: AC
  Administered 2023-02-21: 4 mg via INTRAVENOUS
  Filled 2023-02-21: qty 1

## 2023-02-21 MED ORDER — IOHEXOL 350 MG/ML SOLN
75.0000 mL | Freq: Once | INTRAVENOUS | Status: AC | PRN
  Administered 2023-02-21: 75 mL via INTRAVENOUS

## 2023-02-21 NOTE — Progress Notes (Signed)
   02/21/23 2332  Spiritual Encounters  Type of Visit Follow up  Care provided to: Pt and family  Conversation partners present during encounter Physician  Referral source Family  Reason for visit Routine spiritual support  OnCall Visit Yes   Return to provided spiritual care, present when PA explained CT findings. Called patient's spouse so patient and doctor could provide update to spouse.

## 2023-02-21 NOTE — ED Provider Notes (Incomplete)
[place] EMERGENCY DEPARTMENT AT [facility] Provider Note   CSN: [id number] Arrival date & time: 02/21/23  2104     History Chief Complaint  Patient presents with  . Level 2 Fall    Joseph Ortega is a 67 y.o. 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  History obtained from patient and ER personnel.  He was brought in by EMS.  Apparently the patient was coming over the grocery store and was walking up the driveway when he tripped and fell hitting the back of his head.  Was seen by wife however wife is not at bedside right now.  Did have LOC and was reportedly for a few minutes.  When fire arrived he was incontinent however the patient does wear incontinent briefs already.  When fire arrived he was little altered however when EMS arrived that had resolved.  He does have some short-term memory issues from his strokes.  Complaining of right sided lower back pain currently.  There is a hematoma seen to the back of the head with small abrasion but no laceration.  He currently denies any chest pain, belly pain, or any extremity pain.  He reports he does have a history of of lower leg edema and is on Lasix for this. He sees Engineer, maintenance (IT) for primary care, so my other history is limited. On ASA, but denies anticoagulant use.   HPI     Home Medications Prior to Admission medications   Medication Sig Start Date End Date Taking? Authorizing Provider  acetaminophen (TYLENOL) 325 MG tablet Take 2 tablets (650 mg total) by mouth every 4 (four) hours as needed for mild pain (or temp > 37.5 C (99.5 F)). Patient not taking: Reported on 02/11/2023 05/23/22   Angiulli, Mcarthur Rossetti, PA-C  aspirin EC 81 MG tablet Take 1 tablet (81 mg total) by mouth daily. Swallow whole. 05/08/22   Mathews Argyle, NP  busPIRone (BUSPAR) 10 MG tablet Take 10 mg by  mouth 3 (three) times daily. 02/06/23   [provider]  carvedilol (COREG) 12.5 MG tablet Take 1 tablet (12.5 mg total) by mouth 2 (two) times daily with a meal. 09/11/22   Ranelle Oyster, MD  dapagliflozin propanediol (FARXIGA) 10 MG TABS tablet Take 1 tablet (10 mg total) by mouth daily. 05/23/22   Angiulli, Mcarthur Rossetti, PA-C  docusate sodium (COLACE) 100 MG capsule Take 100 mg by mouth 2 (two) times daily.    [provider]  Dulaglutide (TRULICITY) 3 MG/0.5ML SOPN Inject 3 mg into the skin once a week. 05/23/22   Angiulli, Mcarthur Rossetti, PA-C  DULoxetine (CYMBALTA) 30 MG capsule Take 30 mg by mouth 2 (two) times daily.    [provider]  gabapentin (NEURONTIN) 300 MG capsule Take 1 capsule (300 mg total) by mouth 3 (three) times daily. 10/10/22   Ranelle Oyster, MD  guanFACINE (INTUNIV) 2 MG TB24 ER tablet Take 2 mg by mouth daily. 01/16/23   [provider]  hydrochlorothiazide (HYDRODIURIL) 12.5 MG tablet TAKE 1 TABLET BY MOUTH 2 TIMES DAILY. Patient taking differently: Take 12.5 mg by mouth 2 (two) times daily as needed (fluid). 10/08/22   Ranelle Oyster, MD  insulin aspart (NOVOLOG) 100 UNIT/ML injection Inject 0-15 Units into the skin 3 (three) times daily before meals.    [provider]  insulin detemir (LEVEMIR) 100  UNIT/ML FlexPen Inject 40 Units into the skin at bedtime. Patient taking differently: Inject 40 Units into the skin every evening. 05/29/22   Angiulli, Mcarthur Rossetti, PA-C  KLOR-CON M20 20 MEQ tablet Take 20 mEq by mouth daily as needed (when taking hydrochlorothiazide). 08/24/22   [provider]  levothyroxine (SYNTHROID) 125 MCG tablet Take 1 tablet (125 mcg total) by mouth daily at 6 (six) AM. 05/23/22   Angiulli, Mcarthur Rossetti, PA-C  methocarbamol (ROBAXIN) 500 MG tablet Take 1 tablet (500 mg total) by mouth 4 (four) times daily as needed for muscle spasms. Patient not taking: Reported on 02/11/2023 11/24/22   Meyran, Tiana Loft, NP   Multiple Vitamins-Minerals (ALIVE MENS GUMMY MULTIVITAMINS) CHEW Chew 3 tablets by mouth daily.    [provider]  oxyCODONE-acetaminophen (PERCOCET/ROXICET) 5-325 MG tablet Take 1 tablet by mouth every 4 (four) hours as needed for severe pain. Patient not taking: Reported on 02/11/2023 11/24/22   Sherryl Manges, NP  pantoprazole (PROTONIX) 40 MG tablet Take 1 tablet (40 mg total) by mouth at bedtime. Patient not taking: Reported on 02/11/2023 05/23/22   Angiulli, Mcarthur Rossetti, PA-C  rosuvastatin (CRESTOR) 40 MG tablet Take 1 tablet (40 mg total) by mouth daily. 05/23/22   Angiulli, Mcarthur Rossetti, PA-C  topiramate (TOPAMAX) 50 MG tablet Take 1 tablet (50 mg total) by mouth daily. 05/23/22   Angiulli, Mcarthur Rossetti, PA-C  traMADol (ULTRAM) 50 MG tablet Take 50 mg by mouth every 8 (eight) hours as needed. 12/18/22   [provider]  traZODone (DESYREL) 100 MG tablet Take 100 mg by mouth at bedtime. 01/16/23   [provider]      Allergies    Patient has no known allergies.    Review of Systems   Review of Systems  Eyes:  Negative for visual disturbance.  Respiratory:  Negative for shortness of breath.   Cardiovascular:  Negative for chest pain.  Gastrointestinal:  Negative for abdominal pain, nausea and vomiting.  Musculoskeletal:  Positive for back pain. Negative for neck pain.  Neurological:  Negative for headaches.       Reports LOC    Physical Exam Updated Vital Signs BP (!) 160/90 (BP Location: Right Arm)   Pulse 84   Temp (!) 97.5 F (36.4 C) (Temporal)   Resp 18   SpO2 99%  Physical Exam Vitals and nursing note reviewed.  Constitutional:      General: He is not in acute distress.    Appearance: He is not ill-appearing or toxic-appearing.  HENT:     Head:     Comments: Large hematoma seen to the crown parietal area. Soft. Overlying abrasion without laceration. No step off or deformities.    Right Ear: Tympanic membrane, ear canal and external ear normal.      Left Ear: Tympanic membrane, ear canal and external ear normal.     Ears:     Comments: No hemotympanums    Mouth/Throat:     Mouth: Mucous membranes are moist.  Eyes:     General: No scleral icterus.    Extraocular Movements: Extraocular movements intact.     Pupils: Pupils are equal, round, and reactive to light.  Neck:     Comments: In c-collar  Cardiovascular:     Rate and Rhythm: Normal rate.  Pulmonary:     Effort: Pulmonary effort is normal. No respiratory distress.  Abdominal:     Palpations: Abdomen is soft.     Tenderness: There is no abdominal  tenderness. There is no guarding or rebound.  Genitourinary:    Comments: Good rectal tone.  Musculoskeletal:       Arms:     Right lower leg: Edema present.     Left lower leg: Edema present.     Comments: 2+ pitting edema bilaterally. Some small venous ulcerations seen to the lower legs. 2+ DP and PT pulses bilaterally. Left upper hand held ina contracture however he is able to move his fingers. Reports that is baseline for him. Is able to lift the arm and move fingers. No tenderness to the upper or lower extremities bilaterally. Reports sensation is at his baseline. No midline T or L spine tenderness to palpation. Tenderness to the marked area above. There are no overlying skin changes or signs of trauma to the back.  Skin:    General: Skin is warm and dry.  Neurological:     Mental Status: He is alert.     ED Results / Procedures / Treatments   Labs (all labs ordered are listed, but only abnormal results are displayed) Labs Reviewed  CBC WITH DIFFERENTIAL/PLATELET  CK  MAGNESIUM  RAPID URINE DRUG SCREEN, HOSP PERFORMED  URINALYSIS, ROUTINE W REFLEX MICROSCOPIC  COMPREHENSIVE METABOLIC PANEL  ETHANOL  BRAIN NATRIURETIC PEPTIDE  I-STAT CHEM 8, ED  TYPE AND SCREEN  TROPONIN I (HIGH SENSITIVITY)    EKG None  Radiology No results found.  Procedures Procedures   Medications Ordered in ED Medications -  No data to display  ED Course/ Medical Decision Making/ A&P    Medical Decision Making Amount and/or Complexity of Data Reviewed Labs: ordered. Radiology: ordered.   ***  {Document critical care time when appropriate:1} {Document review of labs and clinical decision tools ie heart score, Chads2Vasc2 etc:1}  {Document your independent review of radiology images, and any outside records:1} {Document your discussion with family members, caretakers, and with consultants:1} {Document social determinants of health affecting pt's care:1} {Document your decision making why or why not admission, treatments were needed:1} Final Clinical Impression(s) / ED Diagnoses Final diagnoses:  None    Rx / DC Orders ED Discharge Orders     None

## 2023-02-21 NOTE — ED Notes (Addendum)
Trauma Response Nurse Documentation   Joseph Ortega is a 67 y.o. male arriving to [facility] ED via EMS  On No antithrombotic. Trauma was activated as a Level 2 by ED charge RN based on the following trauma criteria GCS 10-14 associated with trauma or AVPU < A.  Patient cleared for CT by Alison Murray, ED PA-C. Pt transported to CT with trauma response nurse present to monitor. RN remained with the patient throughout their absence from the department for clinical observation.   GCS 15.  History   Past Medical History:  Diagnosis Date   Anxiety    Arthritis    Depression    Diabetes mellitus without complication (HCC)    Headache    Hypertension    Hypothyroidism    Sleep apnea    wears CPAP   Stroke [facility])    1 stroke 2/24, and 2 strokes on 03/09/21   Thyroid disease      Past Surgical History:  Procedure Laterality Date   ANTERIOR CERVICAL DECOMP/DISCECTOMY FUSION N/A 11/23/2022   Procedure: Anterior Cervical Decompression Fusion  Cervical four-five;  Surgeon: Lisbeth Renshaw, MD;  Location: [facility] OR;  Service: Neurosurgery;  Laterality: N/A;   GROIN DEBRIDEMENT Left    ingrown hair, possibly abscesses, surgery to treat infection   HERNIA REPAIR Left    inguinal   IR ANGIO INTRA EXTRACRAN SEL COM CAROTID INNOMINATE BILAT MOD SED  03/09/2021   IR ANGIO VERTEBRAL SEL VERTEBRAL BILAT MOD SED  03/09/2021   IR US GUIDE VASC ACCESS RIGHT  03/09/2021   LOOP RECORDER INSERTION N/A 05/07/2022   Procedure: LOOP RECORDER INSERTION;  Surgeon: Lanier Prude, MD;  Location: [facility] INVASIVE CV LAB;  Service: Cardiovascular;  Laterality: N/A;       Initial Focused Assessment (If applicable, or please see trauma documentation): Alert/oriented male presents via EMS from his driveway after a ground level fall with +LOC. Hematoma to the back of his head with abrasion, midback pain.  Airway patent, BS clear No obvious uncontrolled hemorrhage GCS 15 PERRLA 2  CT's Completed:   CT Head, CT  C-Spine, CT Chest w/ contrast, and CT abdomen/pelvis w/ contrast  T/L spine Interventions:  Trauma lab draw Portable chest and pelvis CT head, C/T/L spine, chest/abd/pelvis  Plan for disposition:  Admit  Consults completed:  Nundkumar neurosurg paged at 2305, call returned to EDP at [address] trauma paged at   Event Summary: Presents via EMS from home after a mechanical fall in his driveway, per EMS report he tripped and lost consciousness. Confused upon EMS arrival but GCS has improved to 15. Left posterior head hematoma and mid back pain. Scans with 13 x 13 mm hemorrhagic parenchymal contusion in the right temporal lobe and acute cholecystitis. U/S ordered. Admit.  MTP Summary (If applicable): NA  Bedside handoff with ED RN Candace.    Joseph Ortega O Joseph Ortega  Trauma Response RN  Please call TRN at [phone] for further assistance.

## 2023-02-21 NOTE — Progress Notes (Signed)
   02/21/23 2140  Spiritual Encounters  Type of Visit Initial  Care provided to: Pt and family  Conversation partners present during encounter Nurse  Referral source Trauma page  Reason for visit Routine spiritual support  OnCall Visit Yes   Responded to trauma page due to fall. Provided spiritual care to patient. Patient does not remember falling. Patient was with his wife at the time who is disabled and wheel chair bound. He was very concerned for her as she was outside with him when it happened. Patient did not have his phone and was unable to reach his wife Eunice Blase. Chaplain called patient's wife on the pager phone allow him to check on her and ease both their minds. Wife was at ease, neighbors came to her aid and were able to assist her in getting into their home and groceries into home. Wife was beneficial in explaining to patient how he lost his balance when his walker tangled her chair. She was unable to prevent him from crashing to the ground. She stated he hit the concrete very hard on his back and head, and was afraid he may have damaged his neck as he recently underwent neck surgery/treatment.  Remained with patient until he was take for CT scan. Patient's wife Eunice Blase can be reached at [phone]

## 2023-02-21 NOTE — ED Triage Notes (Signed)
BIB EMS from home.  Walking up from car, tripped, fell on his right side. Hit his head. LOC.  Incontinent on arrival of fire.  Altered.   REsolved by the time EMS arrived however short term memory is effected. Back pain.  Hematoma to occipital portion of head. Bleeding controlled.  Stroke hx with residual left arm deficits.  211/112, 92-95%, Right BBB on 12 lead with EMS.
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
## 2023-02-22 ENCOUNTER — Inpatient Hospital Stay (HOSPITAL_COMMUNITY): Payer: Medicare HMO

## 2023-02-22 DIAGNOSIS — I6389 Other cerebral infarction: Secondary | ICD-10-CM

## 2023-02-22 DIAGNOSIS — I169 Hypertensive crisis, unspecified: Secondary | ICD-10-CM | POA: Diagnosis present

## 2023-02-22 DIAGNOSIS — G4733 Obstructive sleep apnea (adult) (pediatric): Secondary | ICD-10-CM | POA: Diagnosis not present

## 2023-02-22 DIAGNOSIS — E119 Type 2 diabetes mellitus without complications: Secondary | ICD-10-CM

## 2023-02-22 HISTORY — DX: Hypertensive crisis, unspecified: I16.9

## 2023-02-22 LAB — COMPREHENSIVE METABOLIC PANEL
ALT: 10 U/L (ref 0–44)
AST: 17 U/L (ref 15–41)
Albumin: 3 g/dL — ABNORMAL LOW (ref 3.5–5.0)
Alkaline Phosphatase: 96 U/L (ref 38–126)
Anion gap: 10 (ref 5–15)
BUN: 20 mg/dL (ref 8–23)
CO2: 26 mmol/L (ref 22–32)
Calcium: 9 mg/dL (ref 8.9–10.3)
Chloride: 107 mmol/L (ref 98–111)
Creatinine, Ser: 0.89 mg/dL (ref 0.61–1.24)
GFR, Estimated: >60 mL/min (ref >60)
Glucose, Bld: 188 mg/dL — ABNORMAL HIGH (ref 70–99)
Potassium: 3.3 mmol/L — ABNORMAL LOW (ref 3.5–5.1)
Sodium: 143 mmol/L (ref 135–145)
Total Bilirubin: 0.9 mg/dL (ref <1.2)
Total Protein: 6.5 g/dL (ref 6.5–8.1)

## 2023-02-22 LAB — ECHOCARDIOGRAM COMPLETE
AR max vel: 2.25 cm2
AV Area VTI: 2.3 cm2
AV Area mean vel: 2.18 cm2
AV Mean grad: 3 mm[Hg]
AV Peak grad: 6.1 mm[Hg]
Ao pk vel: 1.23 m/s
Area-P 1/2: 3.1 cm2
Calc EF: 64.1 %
S' Lateral: 3.2 cm
Single Plane A2C EF: 60 %
Single Plane A4C EF: 68.1 %
Weight: 3668.45 [oz_av]

## 2023-02-22 LAB — CBC
HCT: 40.7 % (ref 39.0–52.0)
Hemoglobin: 13.8 g/dL (ref 13.0–17.0)
MCH: 31.7 pg (ref 26.0–34.0)
MCHC: 33.9 g/dL (ref 30.0–36.0)
MCV: 93.3 fL (ref 80.0–100.0)
Platelets: 230 10*3/uL (ref 150–400)
RBC: 4.36 MIL/uL (ref 4.22–5.81)
RDW: 14.5 % (ref 11.5–15.5)
WBC: 7.2 10*3/uL (ref 4.0–10.5)
nRBC: 0 % (ref 0.0–0.2)

## 2023-02-22 LAB — GLUCOSE, CAPILLARY
Glucose-Capillary: 294 mg/dL — ABNORMAL HIGH (ref 70–99)
Glucose-Capillary: 263 mg/dL — ABNORMAL HIGH (ref 70–99)
Glucose-Capillary: 219 mg/dL — ABNORMAL HIGH (ref 70–99)
Glucose-Capillary: 156 mg/dL — ABNORMAL HIGH (ref 70–99)
Glucose-Capillary: 204 mg/dL — ABNORMAL HIGH (ref 70–99)
Glucose-Capillary: 216 mg/dL — ABNORMAL HIGH (ref 70–99)

## 2023-02-22 LAB — URINALYSIS, ROUTINE W REFLEX MICROSCOPIC
Bacteria, UA: NONE SEEN
Bilirubin Urine: NEGATIVE
Glucose, UA: >=500 mg/dL — AB
Ketones, ur: 5 mg/dL — AB
Leukocytes,Ua: NEGATIVE
Nitrite: NEGATIVE
Protein, ur: NEGATIVE mg/dL
Specific Gravity, Urine: 1.005 (ref 1.005–1.030)
pH: 7 (ref 5.0–8.0)

## 2023-02-22 LAB — HEMOGLOBIN A1C
Hgb A1c MFr Bld: 10.1 % — ABNORMAL HIGH (ref 4.8–5.6)
Mean Plasma Glucose: 243.17 mg/dL

## 2023-02-22 LAB — RAPID URINE DRUG SCREEN, HOSP PERFORMED
Amphetamines: NOT DETECTED
Barbiturates: NOT DETECTED
Benzodiazepines: NOT DETECTED
Cocaine: NOT DETECTED
Opiates: NOT DETECTED
Tetrahydrocannabinol: NOT DETECTED

## 2023-02-22 LAB — TROPONIN I (HIGH SENSITIVITY): Troponin I (High Sensitivity): 28 ng/L — ABNORMAL HIGH (ref <18)

## 2023-02-22 LAB — TYPE AND SCREEN
ABO/RH(D): O POS
Antibody Screen: NEGATIVE

## 2023-02-22 LAB — MRSA NEXT GEN BY PCR, NASAL: MRSA by PCR Next Gen: DETECTED — AB

## 2023-02-22 LAB — LIPASE, BLOOD: Lipase: 22 U/L (ref 11–51)

## 2023-02-22 MED ORDER — MUPIROCIN 2 % EX OINT
1.0000 | TOPICAL_OINTMENT | Freq: Two times a day (BID) | CUTANEOUS | Status: DC
  Administered 2023-02-22 – 2023-02-25 (×7): 1 via NASAL
  Filled 2023-02-22: qty 22

## 2023-02-22 MED ORDER — TETANUS-DIPHTH-ACELL PERTUSSIS 5-2.5-18.5 LF-MCG/0.5 IM SUSY
0.5000 mL | PREFILLED_SYRINGE | Freq: Once | INTRAMUSCULAR | Status: AC
  Administered 2023-02-23: 0.5 mL via INTRAMUSCULAR
  Filled 2023-02-22 (×2): qty 0.5

## 2023-02-22 MED ORDER — MORPHINE SULFATE (PF) 2 MG/ML IV SOLN
INTRAVENOUS | Status: AC
  Filled 2023-02-22: qty 1

## 2023-02-22 MED ORDER — TRAZODONE HCL 100 MG PO TABS
100.0000 mg | ORAL_TABLET | Freq: Every day | ORAL | Status: DC
  Administered 2023-02-22 – 2023-02-23 (×2): 100 mg via ORAL
  Filled 2023-02-22 (×2): qty 2
  Filled 2023-02-22 (×3): qty 1

## 2023-02-22 MED ORDER — NYSTATIN 100000 UNIT/GM EX CREA
TOPICAL_CREAM | Freq: Three times a day (TID) | CUTANEOUS | Status: DC
  Filled 2023-02-22: qty 30

## 2023-02-22 MED ORDER — POTASSIUM CHLORIDE CRYS ER 20 MEQ PO TBCR
40.0000 meq | EXTENDED_RELEASE_TABLET | Freq: Once | ORAL | Status: AC
  Administered 2023-02-22: 40 meq via ORAL
  Filled 2023-02-22: qty 2

## 2023-02-22 MED ORDER — ORAL CARE MOUTH RINSE: 15.0000 mL | OROMUCOSAL | Status: DC | PRN

## 2023-02-22 MED ORDER — CLOTRIMAZOLE 1 % EX CREA
TOPICAL_CREAM | Freq: Two times a day (BID) | CUTANEOUS | Status: DC
  Filled 2023-02-22: qty 15

## 2023-02-22 MED ORDER — TRAMADOL HCL 50 MG PO TABS
50.0000 mg | ORAL_TABLET | Freq: Three times a day (TID) | ORAL | Status: DC | PRN
  Administered 2023-02-22 – 2023-02-25 (×5): 50 mg via ORAL
  Filled 2023-02-22 (×5): qty 1

## 2023-02-22 MED ORDER — LEVETIRACETAM 500 MG PO TABS
500.0000 mg | ORAL_TABLET | Freq: Two times a day (BID) | ORAL | Status: AC
  Administered 2023-02-22 – 2023-02-24 (×7): 500 mg via ORAL
  Filled 2023-02-22 (×7): qty 1

## 2023-02-22 MED ORDER — TECHNETIUM TC 99M MEBROFENIN IV KIT
5.0000 | PACK | Freq: Once | INTRAVENOUS | Status: AC | PRN
  Administered 2023-02-22: 5 via INTRAVENOUS

## 2023-02-22 MED ORDER — GABAPENTIN 300 MG PO CAPS
300.0000 mg | ORAL_CAPSULE | Freq: Three times a day (TID) | ORAL | Status: DC
  Administered 2023-02-22 – 2023-02-25 (×10): 300 mg via ORAL
  Filled 2023-02-22 (×10): qty 1

## 2023-02-22 MED ORDER — CHLORHEXIDINE GLUCONATE CLOTH 2 % EX PADS
6.0000 | MEDICATED_PAD | Freq: Every day | CUTANEOUS | Status: DC
  Administered 2023-02-22 – 2023-02-24 (×2): 6 via TOPICAL

## 2023-02-22 MED ORDER — DOCUSATE SODIUM 100 MG PO CAPS: 100.0000 mg | ORAL_CAPSULE | Freq: Two times a day (BID) | ORAL | Status: DC | PRN

## 2023-02-22 MED ORDER — LISINOPRIL 10 MG PO TABS
10.0000 mg | ORAL_TABLET | Freq: Every day | ORAL | Status: DC
  Administered 2023-02-22: 10 mg via ORAL
  Filled 2023-02-22: qty 1

## 2023-02-22 MED ORDER — INSULIN DETEMIR 100 UNIT/ML ~~LOC~~ SOLN
15.0000 [IU] | Freq: Every day | SUBCUTANEOUS | Status: DC
  Filled 2023-02-22: qty 0.15

## 2023-02-22 MED ORDER — POLYETHYLENE GLYCOL 3350 17 G PO PACK: 17.0000 g | PACK | Freq: Every day | ORAL | Status: DC | PRN

## 2023-02-22 MED ORDER — HYDROCHLOROTHIAZIDE 12.5 MG PO TABS
12.5000 mg | ORAL_TABLET | Freq: Two times a day (BID) | ORAL | Status: DC
  Administered 2023-02-22 – 2023-02-25 (×5): 12.5 mg via ORAL
  Filled 2023-02-22 (×7): qty 1

## 2023-02-22 MED ORDER — TOPIRAMATE 25 MG PO TABS
50.0000 mg | ORAL_TABLET | Freq: Every day | ORAL | Status: DC
  Administered 2023-02-22 – 2023-02-25 (×4): 50 mg via ORAL
  Filled 2023-02-22 (×4): qty 2

## 2023-02-22 MED ORDER — DOCUSATE SODIUM 100 MG PO CAPS
100.0000 mg | ORAL_CAPSULE | Freq: Two times a day (BID) | ORAL | Status: DC
  Administered 2023-02-22 – 2023-02-25 (×5): 100 mg via ORAL
  Filled 2023-02-22 (×6): qty 1

## 2023-02-22 MED ORDER — PERFLUTREN LIPID MICROSPHERE
1.0000 mL | INTRAVENOUS | Status: AC | PRN
  Administered 2023-02-22: 4 mL via INTRAVENOUS

## 2023-02-22 MED ORDER — CHLORHEXIDINE GLUCONATE CLOTH 2 % EX PADS
6.0000 | MEDICATED_PAD | Freq: Every day | CUTANEOUS | Status: DC
Start: 2023-02-22 — End: 2023-02-22

## 2023-02-22 MED ORDER — CARVEDILOL 12.5 MG PO TABS
12.5000 mg | ORAL_TABLET | Freq: Two times a day (BID) | ORAL | Status: DC
  Administered 2023-02-22 – 2023-02-25 (×5): 12.5 mg via ORAL
  Filled 2023-02-22 (×7): qty 1

## 2023-02-22 MED ORDER — INSULIN ASPART 100 UNIT/ML IJ SOLN
0.0000 [IU] | INTRAMUSCULAR | Status: DC
  Administered 2023-02-22: 5 [IU] via SUBCUTANEOUS
  Administered 2023-02-22: 3 [IU] via SUBCUTANEOUS
  Administered 2023-02-22: 8 [IU] via SUBCUTANEOUS
  Administered 2023-02-22: 5 [IU] via SUBCUTANEOUS
  Administered 2023-02-22: 8 [IU] via SUBCUTANEOUS
  Administered 2023-02-23 (×4): 5 [IU] via SUBCUTANEOUS
  Administered 2023-02-23: 8 [IU] via SUBCUTANEOUS
  Administered 2023-02-24: 3 [IU] via SUBCUTANEOUS
  Administered 2023-02-24 (×2): 5 [IU] via SUBCUTANEOUS
  Administered 2023-02-24: 2 [IU] via SUBCUTANEOUS
  Administered 2023-02-24: 3 [IU] via SUBCUTANEOUS
  Administered 2023-02-24: 2 [IU] via SUBCUTANEOUS
  Administered 2023-02-25 (×3): 3 [IU] via SUBCUTANEOUS
  Administered 2023-02-25: 5 [IU] via SUBCUTANEOUS

## 2023-02-22 MED ORDER — MORPHINE SULFATE (PF) 2 MG/ML IV SOLN
3.0000 mg | Freq: Once | INTRAVENOUS | Status: AC
  Administered 2023-02-22: 3 mg via INTRAVENOUS

## 2023-02-22 MED ORDER — TIZANIDINE HCL 4 MG PO TABS
2.0000 mg | ORAL_TABLET | Freq: Three times a day (TID) | ORAL | Status: DC | PRN
  Filled 2023-02-22: qty 1

## 2023-02-22 MED ORDER — ROSUVASTATIN CALCIUM 20 MG PO TABS
40.0000 mg | ORAL_TABLET | Freq: Every day | ORAL | Status: DC
  Administered 2023-02-22 – 2023-02-25 (×4): 40 mg via ORAL
  Filled 2023-02-22 (×4): qty 2

## 2023-02-22 MED ORDER — MORPHINE SULFATE (PF) 4 MG/ML IV SOLN: 4.0000 mg | INTRAVENOUS | Status: DC | PRN

## 2023-02-22 MED ORDER — ACETAMINOPHEN 325 MG PO TABS
650.0000 mg | ORAL_TABLET | Freq: Four times a day (QID) | ORAL | Status: DC | PRN
  Administered 2023-02-22: 650 mg via ORAL
  Filled 2023-02-22: qty 2

## 2023-02-22 MED ORDER — BUSPIRONE HCL 10 MG PO TABS
10.0000 mg | ORAL_TABLET | Freq: Three times a day (TID) | ORAL | Status: DC
  Administered 2023-02-22 – 2023-02-25 (×10): 10 mg via ORAL
  Filled 2023-02-22 (×10): qty 1

## 2023-02-22 MED ORDER — INSULIN DETEMIR 100 UNIT/ML ~~LOC~~ SOLN
20.0000 [IU] | Freq: Every day | SUBCUTANEOUS | Status: DC
  Administered 2023-02-22 – 2023-02-24 (×4): 20 [IU] via SUBCUTANEOUS
  Filled 2023-02-22 (×6): qty 0.2

## 2023-02-22 MED ORDER — LEVOTHYROXINE SODIUM 25 MCG PO TABS
125.0000 ug | ORAL_TABLET | Freq: Every day | ORAL | Status: DC
  Administered 2023-02-22 – 2023-02-25 (×4): 125 ug via ORAL
  Filled 2023-02-22 (×4): qty 1

## 2023-02-22 MED ORDER — POTASSIUM CHLORIDE CRYS ER 20 MEQ PO TBCR: 20.0000 meq | EXTENDED_RELEASE_TABLET | Freq: Every day | ORAL | Status: DC | PRN

## 2023-02-22 MED ORDER — DULOXETINE HCL 30 MG PO CPEP
30.0000 mg | ORAL_CAPSULE | Freq: Two times a day (BID) | ORAL | Status: DC
  Administered 2023-02-22 – 2023-02-25 (×8): 30 mg via ORAL
  Filled 2023-02-22 (×8): qty 1

## 2023-02-22 NOTE — Consult Note (Addendum)
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 is an 67 y.o. male  Status post fall Right temporal hemorrhagic contusion Scalp hematoma Soft tissue swelling pelvic area Radiologically abnormal appearing gallbladder Elevated alkaline phosphatase level Elevated BNP History of multiple strokes CHF Diabetes mellitus type 2 Hypertension OSA Bilateral lower extremity edema/lymphedema Venous stasis ulcer left lower extremity   Admit to medicine service given his multiple comorbidities Neurosurgery consultation placed by ER team Recommend admission to progressive care unit Serial neurochecks Repeat head CT later today Keppra N.p.o. except meds and sips for now Follow-up right upper quadrant ultrasound Hold chemical VTE prophylaxis Trend labs PT OT speech eval Management of chronic medical conditions per [facility]  spoke with neurosurgery who recommended repeat CT head in about 8 hours and giving Keppra for 1 week  Gallbladder area definitely appears abnormal on CT imaging.   Without a history of trauma it iwould appear consistent with cholecystitis.  Isolated trauma to the gallbladder would be very atypical.  There is no overlying bruising or ecchymosis in his right upper quadrant.  He has had an isolated elevated alkaline phosphatase level remotely in the past but normalized at a later date.  Follow-up right upper quadrant ultrasound.  May need HIDA if ultrasound is inconclusive.  No leukocytosis.  Would hold on antibiotics for now.  He does give a history of a nausea and vomiting intermittently for the past week and a half so it is possible he has been may be having some mild gallbladder issues  Data reviewed: CT head, C-spine, chest abdomen pelvis; x-rays of pelvis and chest, ED notes, hospital discharge summary from February 2024, echocardiogram from February 2024, labs from this visit as well as labs from August 2024; labs from August 2023  High level of medical decision making  Mary Sella. Andrey Campanile, MD, FACS General, Bariatric, & Minimally Invasive Surgery [facility]

## 2023-02-22 NOTE — Progress Notes (Signed)
  NEUROSURGERY PROGRESS NOTE   Called yesterday regarding this patient presenting after fall with multiple medical problems including multiple previous large-territory strokes. Pt currently in CT however by report has been awake, oriented, with baseline left weakness from prior stroke  IMAGING: CTH yesterday and this am personally reviewed and demonstrates stable appearance of small right frontal opercular IPH without associated edema. Again seen is encephalomalacia from prior right temporo-occipital stroke. No HCP.  IMPRESSION:  67 y.o. male s/p fall with stable small right frontal opercular hemorrhage. This seems to be a rather unusual location for an isolated contusion raising the possibility of recurrent small stroke with hemorrhagic transformation. Nonetheless, I think acute management remains the same.  PLAN: - Cont supportive care per PCCM/trauma - Cont to hold ASA - Finish 7d early post-traumatic SZ prophylaxis - Would recommended continued outpatient neurology f/u - Can f/u in NS clinic on a PRN basis   Lisbeth Renshaw, MD [facility]

## 2023-02-22 NOTE — Progress Notes (Signed)
Transition of Care [facility]) - CAGE-AID Screening   Patient Details  Name: Joseph Ortega MRN: [id number] Date of Birth: 07/15/1955  Transition of Care [facility]) CM/SW Contact:    Katha Hamming, RN Phone Number: 02/22/2023, 12:09 AM     CAGE-AID Screening:    Have You Ever Felt You Ought to Cut Down on Your Drinking or Drug Use?: No Have People Annoyed You By [employer] Your Drinking Or Drug Use?: No Have You Felt Bad Or Guilty About Your Drinking Or Drug Use?: No Have You Ever Had a Drink or Used Drugs First Thing In The Morning to Steady Your Nerves or to Get Rid of a Hangover?: No CAGE-AID Score: 0  Substance Abuse Education Offered: No

## 2023-02-22 NOTE — Progress Notes (Addendum)
eLink Physician-Brief Progress Note Patient Name: Joseph Ortega DOB: November 11, 1955 MRN: [id number]   Date of Service  02/22/2023  HPI/Events of Note  67 year old male with a history of ischemic strokes with residual left-sided weakness, metabolic syndrome, chronic venous insufficiency who tripped and fell hitting the back of his head and losing consciousness found to have parenchymal contusion in the right temporal lobe and pelvic hematoma.  On examination patient has normal vitals saturating 93% on room air.  Results are consistent with hyperglycemia, mildly elevated BNP.  Normal CBC.  Urinalysis with glucosuria and Minor ketonuria.  CT abdomen with gallbladder dilation and pelvic hematoma.   eICU Interventions  Maintain tight blood pressure control  Analgesia with tramadol and morphine  Will need PMNR evaluation  Tight glycemic control  DVT prophylaxis with SCDs GI prophylaxis not indicated   0312 -ST alarms on monitor.  ECG with right bundle branch.  No intervention is currently indicated.  Intervention Category Evaluation Type: New Patient Evaluation  [name] 02/22/2023, 2:51 AM

## 2023-02-22 NOTE — H&P (Signed)
NAME:  Joseph Ortega, MRN:  [id number], DOB:  11-30-1955, LOS: 0 ADMISSION DATE:  02/21/2023, CONSULTATION DATE: 02/22/2023 REFERRING MD:  Andrey Campanile, MD, CHIEF COMPLAINT:  Fall and high hypertension x 5 hrs   History of Present Illness:  An 67 y.o. 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 He  He was walking up the driveway off the car after shopping when he tripped and fell hitting the back of his head. He lost consciousness and remembered only when EMS personnel were around him for picking up. When EMS arrived he had been incontinent but he was wearing briefs already.  They also reported that he appeared a little bit altered initially at the scene but quickly recovered.  He also has had some reported short-term memory issues from his prior strokes.  He complained of right flank pain, and mid back pain, and HA (back of head) in ED.  He was also found to have a hematoma on the back of his head.    He has intermittent episodes of nausea and vomiting for 1 week. Denies right RUQ pain.   Denied alcohol drinking, smoking, and illicit drug use. Due to BP 185/105, he was started on Cleviprex in the ER.   Pertinent  Medical History  Loop recorder since Feb 2024, three ischemic strokes Nov 2022 (twice) and Feb 2024 (one) with residual left sided weakness (uses a walker), HTN, hypothyroidism, DM-2, OSA (on CPAP), LL lymphedema and chronic venous insufficiency  Significant Hospital Events: Including procedures, antibiotic start and stop dates in addition to other pertinent events     Interim History / Subjective:  Cleviprex drip for BP control   Objective   Blood pressure (!) 161/73, pulse 79, temperature (!) 97.5 F (36.4 C), temperature source Temporal, resp. rate 17, SpO2 96%.       No intake  or output data in the 24 hours ending 02/22/23 0140 There were no vitals filed for this visit.  Examination: General: alert, oriented x4, and comfortable. On RA. SpO2 99%  HENT: PERLA, normal pharynx and oral mucosa. No LNE or thyromegaly. No JVD Lungs: symmetrical air entry bilaterally. No crackles or wheezing Cardiovascular: NL S1/S2. No m/g/r Abdomen: no distension or tenderness, LUQ ventral hernia  Extremities: +2 edema. Left>right, with chronic erythremia. Left leg superficial clean ulcer. Left foot tinea pedis  Neuro: LUE 3/5, LUE 4/5. Intact CNs, and right side strength.     Resolved Hospital Problem list     Assessment & Plan:  Mechanical fall and 13 x 13 mm hemorrhagic parenchymal contusion in the right temporal lobe. There is a left posterior parietal scalp hematoma. -Keppra -Seen by NeuroSx -Repeat CT head wo contrast in am -PT eval -SCD -NPO  -Fall, seizure, and aspiration precautions  2. Pelvic hematoma: significant subcutaneous soft tissue swelling/edema/hematoma overlying the upper pelvic area  -Am labs -Pain Mx  3. GB wall thickening and pericholecystic inflammatory changes. Negative U/S  4. Hx of ischemic strokes with with residual left sided weakness, on walker -Hold ASA for now. Resume if am CT head is stable  5. HTN with urgency  -Resume home meds -Add ACEI -Cleviprex drip, wean for SPB<160 mmHg -Echo  6. Hypothyroidism -Resume home meds  7. DM-2 -HbA1c -Lantus and ISS -Accu checks  8. OSA (on CPAP) -Resume  CPAP when sleeping  9. LL lymphedema and chronic venous insufficiency with left leg superficial clean ulcer -Wound care  10. Left foot tinea pedis -Clortrimazole   Best Practice (right click and "Reselect all SmartList Selections" daily)   Diet/type: NPO w/ oral meds DVT prophylaxis: SCDs Start: 02/22/23 0134   Pressure ulcer(s): not present on admission  GI prophylaxis: N/A Lines: N/A Foley:  N/A Code Status:  full code Last  date of multidisciplinary goals of care discussion []   Labs   CBC: Recent Labs  Lab 02/21/23 2115 02/21/23 2134  WBC 8.3  --   NEUTROABS 6.7  --   HGB 15.1 15.6  HCT 46.0 46.0  MCV 93.7  --   PLT 231  --     Basic Metabolic Panel: Recent Labs  Lab 02/21/23 2115 02/21/23 2134  NA 140 143  K 4.1 4.0  CL 105 106  CO2 25  --   GLUCOSE 284* 292*  BUN 24* 26*  CREATININE 1.08 1.10  CALCIUM 9.6  --   MG 2.1  --    GFR: CrCl cannot be calculated (Unknown ideal weight.). Recent Labs  Lab 02/21/23 2115  WBC 8.3    Liver Function Tests: Recent Labs  Lab 02/21/23 2115  AST 25  ALT 10  ALKPHOS 148*  BILITOT 1.0  PROT 7.9  ALBUMIN 3.8   Recent Labs  Lab 02/21/23 2134  LIPASE 22   No results for input(s): "AMMONIA" in the last 168 hours.  ABG    Component Value Date/Time   TCO2 27 02/21/2023 2134     Coagulation Profile: No results for input(s): "INR", "PROTIME" in the last 168 hours.  Cardiac Enzymes: Recent Labs  Lab 02/21/23 2115  CKTOTAL 117    HbA1C: Hgb A1c MFr Bld  Date/Time Value Ref Range Status  11/15/2022 02:39 PM 8.8 (H) 4.8 - 5.6 % Final    Comment:    (NOTE) Pre diabetes:          5.7%-6.4%  Diabetes:              >6.4%  Glycemic control for   <7.0% adults with diabetes   05/02/2022 07:41 PM 8.3 (H) 4.8 - 5.6 % Final    Comment:    (NOTE) Pre diabetes:          5.7%-6.4%  Diabetes:              >6.4%  Glycemic control for   <7.0% adults with diabetes     CBG: No results for input(s): "GLUCAP" in the last 168 hours.  Review of Systems:   Review of Systems  Constitutional:  Negative for chills, diaphoresis, fever, malaise/fatigue and weight loss.  HENT:  Negative for nosebleeds.   Respiratory:  Negative for cough, hemoptysis, sputum production, shortness of breath and wheezing.   Cardiovascular:  Positive for leg swelling. Negative for chest pain, palpitations, orthopnea, claudication and PND.  Gastrointestinal:   Positive for nausea and vomiting. Negative for abdominal pain, blood in stool, constipation, diarrhea, heartburn and melena.  Genitourinary:  Negative for dysuria, frequency and urgency.  Musculoskeletal:  Positive for back pain.  Skin:  Negative for itching and rash.  Neurological:  Positive for loss of consciousness. Negative for dizziness, tremors, speech change and seizures.  Endo/Heme/Allergies:  Does not bruise/bleed easily.     Past Medical History:  He,  has a past medical history of Anxiety, Arthritis, Depression, Diabetes mellitus without complication (HCC), Headache, Hypertension, Hypothyroidism, Sleep apnea, Stroke (  HCC), and Thyroid disease.   Surgical History:   Past Surgical History:  Procedure Laterality Date   ANTERIOR CERVICAL DECOMP/DISCECTOMY FUSION N/A 11/23/2022   Procedure: Anterior Cervical Decompression Fusion  Cervical four-five;  Surgeon: Lisbeth Renshaw, MD;  Location: [facility] OR;  Service: Neurosurgery;  Laterality: N/A;   GROIN DEBRIDEMENT Left    ingrown hair, possibly abscesses, surgery to treat infection   HERNIA REPAIR Left    inguinal   IR ANGIO INTRA EXTRACRAN SEL COM CAROTID INNOMINATE BILAT MOD SED  03/09/2021   IR ANGIO VERTEBRAL SEL VERTEBRAL BILAT MOD SED  03/09/2021   IR US GUIDE VASC ACCESS RIGHT  03/09/2021   LOOP RECORDER INSERTION N/A 05/07/2022   Procedure: LOOP RECORDER INSERTION;  Surgeon: Lanier Prude, MD;  Location: [facility] INVASIVE CV LAB;  Service: Cardiovascular;  Laterality: N/A;     Social History:   reports that he has never smoked. He has never used smokeless tobacco. He reports that he does not drink alcohol and does not use drugs.   Family History:  His family history includes Diabetes in his father.   Allergies No Known Allergies   Home Medications  Prior to Admission medications   Medication Sig Start Date End Date Taking? Authorizing Provider  aspirin EC 81 MG tablet Take 1 tablet (81 mg total) by mouth daily. Swallow  whole. 05/08/22  Yes Mathews Argyle, NP  busPIRone (BUSPAR) 10 MG tablet Take 10 mg by mouth 3 (three) times daily. 02/06/23  Yes [provider]  carvedilol (COREG) 12.5 MG tablet Take 1 tablet (12.5 mg total) by mouth 2 (two) times daily with a meal. 09/11/22  Yes Ranelle Oyster, MD  dapagliflozin propanediol (FARXIGA) 10 MG TABS tablet Take 1 tablet (10 mg total) by mouth daily. 05/23/22  Yes Angiulli, Mcarthur Rossetti, PA-C  diphenhydrAMINE (BENADRYL) 25 mg capsule Take 25 mg by mouth every 6 (six) hours as needed for allergies.   Yes [provider]  docusate sodium (COLACE) 100 MG capsule Take 100 mg by mouth 2 (two) times daily.   Yes [provider]  Dulaglutide (TRULICITY) 3 MG/0.5ML SOPN Inject 3 mg into the skin once a week. Patient taking differently: Inject 3 mg into the skin once a week. Inject on Sunday 05/23/22  Yes Angiulli, Mcarthur Rossetti, PA-C  DULoxetine (CYMBALTA) 30 MG capsule Take 30 mg by mouth 2 (two) times daily.   Yes [provider]  gabapentin (NEURONTIN) 300 MG capsule Take 1 capsule (300 mg total) by mouth 3 (three) times daily. 10/10/22  Yes Ranelle Oyster, MD  guanFACINE (INTUNIV) 2 MG TB24 ER tablet Take 2 mg by mouth daily. 01/16/23  Yes [provider]  hydrochlorothiazide (HYDRODIURIL) 12.5 MG tablet TAKE 1 TABLET BY MOUTH 2 TIMES DAILY. Patient taking differently: Take 12.5 mg by mouth 2 (two) times daily as needed (fluid). 10/08/22  Yes Ranelle Oyster, MD  insulin aspart (NOVOLOG) 100 UNIT/ML injection Inject 0-15 Units into the skin 3 (three) times daily before meals. Maximum of 20 units   Yes [provider]  insulin detemir (LEVEMIR) 100 UNIT/ML FlexPen Inject 40 Units into the skin at bedtime. Patient taking differently: Inject 40 Units into the skin every evening. 05/29/22  Yes Angiulli, Mcarthur Rossetti, PA-C  KLOR-CON M20 20 MEQ tablet Take 20 mEq by mouth daily as needed (when taking hydrochlorothiazide). 08/24/22  Yes  [provider]  levothyroxine (SYNTHROID) 125 MCG tablet Take 1 tablet (125 mcg total) by mouth daily  at 6 (six) AM. 05/23/22  Yes Angiulli, Mcarthur Rossetti, PA-C  Multiple Vitamins-Minerals (ALIVE MENS GUMMY MULTIVITAMINS) CHEW Chew 3 tablets by mouth daily.   Yes [provider]  rosuvastatin (CRESTOR) 40 MG tablet Take 1 tablet (40 mg total) by mouth daily. 05/23/22  Yes Angiulli, Mcarthur Rossetti, PA-C  tiZANidine (ZANAFLEX) 2 MG tablet 2 mg every 8 (eight) hours as needed for muscle spasms. 02/17/23  Yes [provider]  topiramate (TOPAMAX) 50 MG tablet Take 1 tablet (50 mg total) by mouth daily. 05/23/22  Yes Angiulli, Mcarthur Rossetti, PA-C  traMADol (ULTRAM) 50 MG tablet Take 50 mg by mouth every 8 (eight) hours as needed. 12/18/22  Yes [provider]  traZODone (DESYREL) 100 MG tablet Take 100 mg by mouth at bedtime. 01/16/23  Yes [provider]     Critical care time: 60 min
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
## 2023-02-22 NOTE — Progress Notes (Signed)
PT Cancellation Note  Patient Details Name: Joseph Ortega MRN: [id number] DOB: 07-25-1955   Cancelled Treatment:    Reason Eval/Treat Not Completed: Patient at procedure or test/unavailable   Off unit for testing. Will follow-up this afternoon, as schedule permits.  Kathlyn [place], PT, DPT [facility] Physical Therapist Office: [phone] Website: [email or web address]   Berton Mount 02/22/2023, 10:18 AM

## 2023-02-22 NOTE — Progress Notes (Signed)
HIDA reviewed and negative for cholecystitis.  Discussed with Dr. Vassie Loll.  No further general surgery or traumatic needs.  We will sign off at this time.  Letha Cape 1:07 PM 02/22/2023

## 2023-02-22 NOTE — Care Management Obs Status (Signed)
MEDICARE OBSERVATION STATUS NOTIFICATION   Patient Details  Name: Joseph Ortega MRN: [id number] Date of Birth: 04-Mar-1956   Medicare Observation Status Notification Given:  Waylan Boga, RN 02/22/2023, 4:34 PM

## 2023-02-22 NOTE — Plan of Care (Signed)
  Problem: Coping: Goal: Ability to adjust to condition or change in health will improve Outcome: Progressing   

## 2023-02-22 NOTE — Progress Notes (Signed)
*  PRELIMINARY RESULTS* Echocardiogram 2D Echocardiogram has been performed.  Joseph Ortega 02/22/2023, 1:24 PM

## 2023-02-22 NOTE — Progress Notes (Signed)
PCCM: Resident Note   NAME:  Joseph Ortega, MRN:  [id number], DOB:  [birth year], LOS: 0 ADMISSION DATE:  02/21/2023, CONSULTATION DATE:  02/22/23 REFERRING MD:  Andrey Campanile, MD, CHIEF COMPLAINT:  Fall and HTN   History of Present Illness:  An 67 y.o. 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 He  He was walking up the driveway after shopping when he tripped and fell hitting the back of his head. He lost consciousness and remembered only when EMS personnel were around him for picking up. When EMS arrived he had been incontinent but he was wearing briefs already.  They also reported that he appeared a little bit altered initially at the scene but quickly recovered.  He also has had some reported short-term memory issues from his prior strokes.  He complained of right flank pain, and mid back pain, and HA (back of head) in ED.  He was also found to have a hematoma on the back of his head.    He has intermittent episodes of nausea and vomiting for 1 week. Denies right RUQ pain.   Denied alcohol drinking, smoking, and illicit drug use. Due to BP 185/105, he was started on Cleviprex in the ER.   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   Review of Systems:   N/V intermittently for past week - has been improving, No abdominal pain  Objective   Blood pressure  130/66, pulse 71, temperature (!) 96.7 F (35.9 C), temperature source Axillary, resp. rate 17, weight 104 kg, SpO2 93%.        Intake/Output Summary (Last 24 hours) at 02/22/2023 0750 Last data filed at 02/22/2023 0500 Gross per 24 hour  Intake 29.01 ml  Output 2400 ml  Net -2370.99 ml   Filed Weights   02/22/23 0300  Weight: 104 kg    Examination: General: 89 male, lying in bed, NAD HENT: White sclera, clear conjunctiva, dry MM Lungs: Breathing comfortably on room air, CTAB anteriorly Cardiovascular: RRR, split S2 Abdomen: Bowel sounds present, soft, nontender palpation, nondistended Extremities: Chronic skin changes of BLEs with pitting edema R>L Neuro: Cranial nerves II through XII intact, sensation intact. Decreased muscle strength of L UE consistent with prior strokes  Labs   CBC:    Latest Ref Rng & Units 02/21/2023    9:34 PM 02/21/2023    9:15 PM 11/15/2022    2:39 PM  CBC  WBC 4.0 - 10.5 K/uL  8.3  7.6   Hemoglobin 13.0 - 17.0 g/dL 46.9  62.9  52.8   Hematocrit 39.0 - 52.0 % 46.0  46.0  40.3   Platelets 150 - 400 K/uL  231  203      Basic Metabolic Panel:    Latest Ref Rng & Units 02/21/2023    9:34 PM 02/21/2023    9:15 PM 11/15/2022    2:39 PM  BMP  Glucose 70 - 99 mg/dL 409  811  914   BUN 8 - 23 mg/dL 26  24  17    Creatinine 0.61 - 1.24 mg/dL 7.82  9.56  2.13   Sodium 135 - 145 mmol/L 143  140  140   Potassium 3.5 - 5.1 mmol/L 4.0  4.1  4.3   Chloride 98 - 111 mmol/L 106  105  103   CO2 22 - 32 mmol/L  25  26   Calcium 8.9 - 10.3 mg/dL  9.6  9.0     GFR: Estimated Creatinine Clearance: 84.8 mL/min (by C-G formula based on SCr of 1.1 mg/dL). Recent Labs  Lab 02/21/23 2115  WBC 8.3    Liver Function Tests: Recent Labs  Lab 02/21/23 2115  AST 25  ALT 10  ALKPHOS 148*  BILITOT 1.0  PROT 7.9  ALBUMIN 3.8   Recent Labs  Lab 02/21/23 2134  LIPASE 22   No results for input(s): "AMMONIA" in the last 168 hours.  ABG    Component Value  Date/Time   TCO2 27 02/21/2023 2134     Coagulation Profile: No results for input(s): "INR", "PROTIME" in the last 168 hours.  Cardiac Enzymes: Recent Labs  Lab 02/21/23 2115  CKTOTAL 117    HbA1C: Hgb A1c MFr Bld  Date/Time Value Ref Range Status  11/15/2022 02:39 PM 8.8 (H) 4.8 - 5.6 % Final    Comment:    (NOTE) Pre diabetes:          5.7%-6.4%  Diabetes:              >6.4%  Glycemic control for   <7.0% adults with diabetes   05/02/2022 07:41 PM 8.3 (H) 4.8 - 5.6 % Final    Comment:    (NOTE) Pre diabetes:          5.7%-6.4%  Diabetes:              >6.4%  Glycemic control for   <7.0% adults with diabetes     CBG: Recent Labs  Lab 02/22/23 0240 02/22/23 0446 02/22/23 0720  GLUCAP 294* 263* 219*   Resolved Hospital Problem list   HTN urgency   Assessment & Plan:  Mechanical fall and 13 x 13 mm hemorrhagic parenchymal contusion in the right temporal lobe. There is a left posterior parietal scalp hematoma. No longer altered. -Keppra -Seen by NeuroSx -f/u repeat CT head wo contrast this morning -PT eval -SCD -Fall, seizure, and aspiration precautions -diet added -if head CT nonconcerning, plan for transfer to floor this afternoon - f/u CBC, not yet drawn   Hx of ischemic strokes with with residual left sided weakness, on walker -Hold ASA for now. Resume if am CT head is stable -continue home statin   HTN  Improving, no longer on cleviprex gtt -Continue home meds - hydrochlorothiazide 12.5 BID, Coreg 12.5 BID -continue ACEI (added 12/5) - lisinopril 10 mg daily -f/u echo -f/u amCMP  Abdominal concern CT with GB wall thickening and pericholecystic inflammatory changes. Negative U/S - no further work up Subcutaneous edema/possible hematoma on CT - no pain/evidence of hematoma, could be his panus, non concerning at this time   Hypothyroidism -Resume home meds   DM-2 -f/u HbA1c -Lantus and ISS -Accu checks   OSA (on CPAP) -Resume CPAP  when sleeping   LL lymphedema and chronic venous insufficiency with left leg superficial clean ulcer -Wound care   Left foot tinea pedis -Clortrimazole   Best  Practice (right click and "Reselect all SmartList Selections" daily)   Diet/type:heart healthy  DVT prophylaxis: SCDs Pressure ulcer(s). Not present GI prophylaxis: N/A Lines: N/A Foley:  N/A Code Status:  Full Last date of multidisciplinary goals of care discussion [12/6 with pt]   Erick Alley, DO Family medicine, PGY-3 02/22/2023 8:53 AM

## 2023-02-22 NOTE — Evaluation (Signed)
Physical Therapy Evaluation Patient Details Name: Joseph Ortega MRN: [id number] DOB: 1955/06/17 Today's Date: 02/22/2023  History of Present Illness  67 y.o. male s/p fall with stable small right frontal opercular hemorrhage. PMH includes Anxiety, Arthritis, Depression, Diabetes mellitus, Headache, HTN, Hypothyroidism, Sleep apnea, Stroke (left hemi).  Clinical Impression  Pt admitted with above diagnosis. Patient states he was mod I at home, ambulatory with RW/Rollator in and outdoors respectively. Able to perform his own ADLs, but wife does the cooking. Prior strokes resulting in Lt hemiparesis. Pt states that he feels back to baseline. Does not notice any additional weakness. He struggled to rise from bed and recliner today, requiring up to min assist from recliner after multiple attempts on his own. Possibly demonstrating some reduced insight/awareness into his deficits and safety. Eager to return home, does not feel he will need post-acute rehab, with which he is familiar. Would need to be, at a minimum, supervision level upon d/c to reduce fall risk and readmission. Encouraged pt to get OOB frequently during admission, with staff assistance to prevent decline in function from immobility. Will recommend HHPT for now but if pt fails to progress quickly, may need to consider pushing CIR vs SNF for a short period of time. Hopefully he will improve back to baseline rapidly.  Pt currently with functional limitations due to the deficits listed below (see PT Problem List). Pt will benefit from acute skilled PT to increase their independence and safety with mobility to allow discharge.           If plan is discharge home, recommend the following: A little help with walking and/or transfers;A little help with bathing/dressing/bathroom;Assistance with cooking/housework;Assist for transportation;Help with stairs or ramp for entrance   Can travel by private vehicle        Equipment Recommendations None  recommended by PT  Recommendations for Other Services  OT consult    Functional Status Assessment Patient has had a recent decline in their functional status and demonstrates the ability to make significant improvements in function in a reasonable and predictable amount of time.     Precautions / Restrictions Precautions Precautions: Fall Precaution Comments: hx of CVA Lt hemi Required Braces or Orthoses: Other Brace Other Brace: has an AFO for Lt foot, not sure if we can get it here. Restrictions Weight Bearing Restrictions: No      Mobility  Bed Mobility Overal bed mobility: Needs Assistance Bed Mobility: Supine to Sit     Supine to sit: Supervision, HOB elevated, Used rails     General bed mobility comments: supervision for safety, effortful to rise from bed but managed without physical assist.    Transfers Overall transfer level: Needs assistance Equipment used: Rolling walker (2 wheels) Transfers: Sit to/from Stand Sit to Stand: Min assist, Contact guard assist, From elevated surface           General transfer comment: CGA for safety to rise from elevated bed surface, attempting to simulate heigh of his bed at home. Effortful, heavy push through Rt side. Required min assist to rise from recliner, after several attempts to perform on his own. Cues for forwoard weight shift, good LE set-up. RW for support upon standing.    Ambulation/Gait Ambulation/Gait assistance: Contact guard assist Gait Distance (Feet): 20 Feet Assistive device: Rolling walker (2 wheels) Gait Pattern/deviations: Step-to pattern, Step-through pattern, Decreased stride length, Decreased dorsiflexion - left, Shuffle Gait velocity: dec Gait velocity interpretation: <1.31 ft/sec, indicative of household ambulator Pre-gait activities: weight shift and march  General Gait Details: Hemiparetic gait, initially with poor Lt foot clearance and reduced awareness. Able to eventually correct and compensate  with larger step length. No overt buckling. Felt lightheaded for a little while but resolved before reaching chair to sit. CGA for safety.  Stairs            Wheelchair Mobility     Tilt Bed    Modified Rankin (Stroke Patients Only) Modified Rankin (Stroke Patients Only) Pre-Morbid Rankin Score: Moderate disability Modified Rankin: Moderately severe disability     Balance Overall balance assessment: Needs assistance Sitting-balance support: No upper extremity supported, Feet supported Sitting balance-Leahy Scale: Fair     Standing balance support: No upper extremity supported Standing balance-Leahy Scale: Fair Standing balance comment: more stable with UE support                             Pertinent Vitals/Pain Pain Assessment Pain Assessment: Faces Faces Pain Scale: Hurts little more Pain Location: back and occiput. Pain Descriptors / Indicators: Aching, Sore Pain Intervention(s): Limited activity within patient's tolerance, Monitored during session, Repositioned    Home Living Family/patient expects to be discharged to:: Private residence Living Arrangements: Spouse/significant other (wife w/c bound) Available Help at Discharge: Family;Available 24 hours/day Type of Home: House Home Access: Ramped entrance;Stairs to enter   Entrance Stairs-Number of Steps: ramp into the house then 2 steps with grab bar   Home Layout: One level Home Equipment: Rolling Walker (2 wheels);Grab bars - tub/shower;Grab bars - toilet;Rollator (4 wheels);Adaptive equipment;BSC/3in1;Shower seat - built in Additional Comments: wife uses trasport chair for mobility in the home    Prior Function Prior Level of Function : Needs assist             Mobility Comments: Uses RW in house, rollator outdoors. ADLs Comments: Wife does the cooking, pt states ind with ADLs.     Extremity/Trunk Assessment   Upper Extremity Assessment Upper Extremity Assessment: Defer to OT  evaluation    Lower Extremity Assessment Lower Extremity Assessment:  (Hx of Lt side weakness from prior stroke. Pt states at baseline currently.)       Communication   Communication Communication: No apparent difficulties  Cognition Arousal: Alert Behavior During Therapy: WFL for tasks assessed/performed Overall Cognitive Status: No family/caregiver present to determine baseline cognitive functioning                                 General Comments: Seems to have some reduced insight into deficits.        General Comments      Exercises     Assessment/Plan    PT Assessment Patient needs continued PT services  PT Problem List Decreased strength;Decreased range of motion;Decreased activity tolerance;Decreased balance;Decreased mobility;Decreased coordination;Decreased cognition;Decreased knowledge of use of DME;Decreased safety awareness;Decreased knowledge of precautions;Impaired tone;Pain       PT Treatment Interventions DME instruction;Gait training;Functional mobility training;Therapeutic activities;Therapeutic exercise;Balance training;Neuromuscular re-education;Cognitive remediation;Patient/family education;Modalities    PT Goals (Current goals can be found in the Care Plan section)  Acute Rehab PT Goals Patient Stated Goal: Go home with wife PT Goal Formulation: With patient Time For Goal Achievement: 03/08/23 Potential to Achieve Goals: Good    Frequency Min 1X/week     Co-evaluation               AM-PAC PT "6 Clicks" Mobility  Outcome Measure Help  needed turning from your back to your side while in a flat bed without using bedrails?: None Help needed moving from lying on your back to sitting on the side of a flat bed without using bedrails?: A Little Help needed moving to and from a bed to a chair (including a wheelchair)?: A Little Help needed standing up from a chair using your arms (e.g., wheelchair or bedside chair)?: A Little Help  needed to walk in hospital room?: A Little Help needed climbing 3-5 steps with a railing? : A Lot 6 Click Score: 18    End of Session Equipment Utilized During Treatment: Gait belt Activity Tolerance: Patient tolerated treatment well Patient left: in chair;with call bell/phone within reach;with chair alarm set Nurse Communication: Mobility status PT Visit Diagnosis: Unsteadiness on feet (R26.81);Other abnormalities of gait and mobility (R26.89);History of falling (Z91.81);Hemiplegia and hemiparesis;Other symptoms and signs involving the nervous system (R29.898) Hemiplegia - Right/Left: Left Hemiplegia - caused by:  (Hx of Lt hemi)    Time: 1610-9604 PT Time Calculation (min) (ACUTE ONLY): 40 min   Charges:   PT Evaluation $PT Eval Moderate Complexity: 1 Mod PT Treatments $Gait Training: 8-22 mins $Therapeutic Activity: 8-22 mins PT General Charges $$ ACUTE PT VISIT: 1 Visit         Kathlyn Sacramento, PT, DPT [facility]  Rehabilitation Services Physical Therapist Office: [phone] Website: [email or web address]   Berton Mount 02/22/2023, 4:30 PM

## 2023-02-22 NOTE — Care Management CC44 (Signed)
Condition Code 44 Documentation Completed  Patient Details  Name: Joseph Ortega MRN: [id number] Date of Birth: 02-May-1955   Condition Code 44 given:  Yes Patient signature on Condition Code 44 notice:  Yes Documentation of 2 MD's agreement:  Yes Code 44 added to claim:  Yes    Michel Bickers, RN 02/22/2023, 4:35 PM

## 2023-02-22 NOTE — Plan of Care (Signed)
  Problem: Education: Goal: Knowledge of General Education information will improve Description: Including pain rating scale, medication(s)/side effects and non-pharmacologic comfort measures Outcome: Progressing   Problem: Health Behavior/Discharge Planning: Goal: Ability to manage health-related needs will improve Outcome: Progressing   Problem: Ischemic Stroke/TIA Tissue Perfusion: Goal: Complications of ischemic stroke/TIA will be minimized Outcome: Progressing   Problem: Coping: Goal: Will identify appropriate support needs Outcome: Progressing   Problem: Health Behavior/Discharge Planning: Goal: Ability to manage health-related needs will improve Outcome: Progressing Goal: Goals will be collaboratively established with patient/family Outcome: Progressing

## 2023-02-23 DIAGNOSIS — I169 Hypertensive crisis, unspecified: Secondary | ICD-10-CM | POA: Diagnosis not present

## 2023-02-23 LAB — GLUCOSE, CAPILLARY
Glucose-Capillary: 179 mg/dL — ABNORMAL HIGH (ref 70–99)
Glucose-Capillary: 204 mg/dL — ABNORMAL HIGH (ref 70–99)
Glucose-Capillary: 216 mg/dL — ABNORMAL HIGH (ref 70–99)
Glucose-Capillary: 218 mg/dL — ABNORMAL HIGH (ref 70–99)
Glucose-Capillary: 228 mg/dL — ABNORMAL HIGH (ref 70–99)
Glucose-Capillary: 233 mg/dL — ABNORMAL HIGH (ref 70–99)
Glucose-Capillary: 281 mg/dL — ABNORMAL HIGH (ref 70–99)

## 2023-02-23 NOTE — Progress Notes (Signed)
Inpatient Rehab Admissions Coordinator:    I met with pt. At bedside to discuss potential CIR admit. He states that his wife is in a wheelchair, but that she can provide supervision and help with cooking and meds. He would like to pursue CIR admit. I will send case to insurance and pursue for admit.   Megan Salon, MS, CCC-SLP Rehab Admissions Coordinator  [phone] (celll) [phone] (office)

## 2023-02-23 NOTE — Evaluation (Addendum)
Occupational Therapy Evaluation Patient Details Name: Joseph Ortega MRN: [id number] DOB: December 17, 1955 Today's Date: 02/23/2023   History of Present Illness 67 y.o. male s/p fall with hemorrhagic parenchymal contusion in R temporal lobe, L posterior parietal scalp hematoma and pelvic hematoma.PMH includes Anxiety, Arthritis, Depression, Diabetes mellitus, Headache, HTN, Hypothyroidism, Sleep apnea, Stroke (left hemi).   Clinical Impression   PTA, pt lives with wife (who is wheelchair bound) and reports typically Modified Independent with ADLs/mobility using RW vs Rollator. Pt presents now with deficits in standing balance, strength, coordination, cognition and vision (baseline L peripheral deficits, new blurriness). Overall, pt required Min-Mod A for in-room mobility using RW due to imbalance and assist to maneuver DME around obstacles. Pt requires Min A for UB ADL and up to Mod A for LB ADLs. Provided handout/education re: precautions after head trauma and importance of avoiding another fall. Based on current balance deficits and high fall risk, pt currently not safe to DC home and open to consideration of postacute rehab. If pt progresses to a Supervision level, able to DC home with wife support.      If plan is discharge home, recommend the following: A lot of help with walking and/or transfers;A lot of help with bathing/dressing/bathroom;Assistance with cooking/housework;Help with stairs or ramp for entrance    Functional Status Assessment  Patient has had a recent decline in their functional status and demonstrates the ability to make significant improvements in function in a reasonable and predictable amount of time.  Equipment Recommendations  None recommended by OT    Recommendations for Other Services Rehab consult     Precautions / Restrictions Precautions Precautions: Fall Precaution Comments: hx of CVA Lt hemi Required Braces or Orthoses: Other Brace Other Brace: has an AFO for  Lt foot but has not been wearing much lately, not at hospital Restrictions Weight Bearing Restrictions: No      Mobility Bed Mobility Overal bed mobility: Needs Assistance Bed Mobility: Supine to Sit     Supine to sit: Supervision, HOB elevated, Used rails          Transfers Overall transfer level: Needs assistance Equipment used: Rolling walker (2 wheels) Transfers: Sit to/from Stand Sit to Stand: Contact guard assist           General transfer comment: Steadying assist to stand from elevated bed      Balance Overall balance assessment: Needs assistance Sitting-balance support: No upper extremity supported, Feet supported Sitting balance-Leahy Scale: Fair     Standing balance support: Bilateral upper extremity supported, No upper extremity supported, During functional activity Standing balance-Leahy Scale: Poor                             ADL either performed or assessed with clinical judgement   ADL Overall ADL's : Needs assistance/impaired Eating/Feeding: Set up   Grooming: Minimal assistance;Standing;Oral care Grooming Details (indicate cue type and reason): assist for balance, locating items on L side of sink. able to use compensatory methods to open toothpaste and place on toothbrush. difficulty identifying soap on R side (was looking for rinse cup but used soap dispenser accidentally) Upper Body Bathing: Minimal assistance   Lower Body Bathing: Moderate assistance;Sitting/lateral leans;Sit to/from stand   Upper Body Dressing : Minimal assistance;Sitting   Lower Body Dressing: Moderate assistance;Sitting/lateral leans;Sit to/from stand   Toilet Transfer: Moderate assistance;Minimal assistance;Ambulation;Rolling walker (2 wheels)   Toileting- Clothing Manipulation and Hygiene: Minimal assistance;Sitting/lateral lean;Sit to/from stand  Functional mobility during ADLs: Moderate assistance;Minimal assistance;Rolling walker (2  wheels) General ADL Comments: significant imbalance with in room mobility using RW     Vision Baseline Vision/History: 1 Wears glasses Ability to See in Adequate Light: 2 Moderately impaired Patient Visual Report: Blurring of vision Vision Assessment?: Vision impaired- to be further tested in functional context;Wears glasses for reading Additional Comments: reports new blurriness. baseline L peripheral vision deficits since CVA     Perception         Praxis         Pertinent Vitals/Pain Pain Assessment Pain Assessment: No/denies pain     Extremity/Trunk Assessment Upper Extremity Assessment Upper Extremity Assessment: LUE deficits/detail;Right hand dominant LUE Deficits / Details: hx of CVA with hemiparesis. reports daily stretching of UE. increased tone in 1st digit but all other joints easily stretched. assists hand onto walker LUE Coordination: decreased fine motor;decreased gross motor   Lower Extremity Assessment Lower Extremity Assessment: Defer to PT evaluation   Cervical / Trunk Assessment Cervical / Trunk Assessment: Normal   Communication Communication Communication: No apparent difficulties   Cognition Arousal: Alert Behavior During Therapy: WFL for tasks assessed/performed Overall Cognitive Status: No family/caregiver present to determine baseline cognitive functioning                                 General Comments: Able to recount medical appts, PMH, etc in detail. initially some decreased insight into deficits but with progressive balance issues, pt showed insight and need to consider rehab before returning home. tangential and hyperverbose, benefits from cues for redirection     General Comments       Exercises     Shoulder Instructions      Home Living Family/patient expects to be discharged to:: Private residence Living Arrangements: Spouse/significant other (wife is w/c bound) Available Help at Discharge: Family;Available 24  hours/day Type of Home: House Home Access: Ramped entrance;Stairs to enter Entrance Stairs-Number of Steps: ramp into the house then 2 steps with grab bar   Home Layout: One level     Bathroom Shower/Tub: Producer, television/film/video: Handicapped height Bathroom Accessibility: Yes How Accessible: Accessible via wheelchair;Accessible via walker Home Equipment: Rolling Walker (2 wheels);Grab bars - tub/shower;Grab bars - toilet;Rollator (4 wheels);Adaptive equipment;BSC/3in1;Shower seat - built in Adaptive Equipment: Reacher;Long-handled sponge        Prior Functioning/Environment Prior Level of Function : Needs assist             Mobility Comments: Uses RW in house, rollator outdoors. ADLs Comments: Indep with ADLs, cares for dogs. Wife does cooking and wife does driving due to pt hx of peripheral deficits from prior CVA        OT Problem List: Decreased strength;Decreased activity tolerance;Impaired balance (sitting and/or standing);Decreased cognition;Decreased safety awareness;Decreased knowledge of use of DME or AE;Impaired vision/perception;Decreased coordination;Impaired UE functional use      OT Treatment/Interventions: Self-care/ADL training;Therapeutic exercise;Energy conservation;DME and/or AE instruction;Therapeutic activities;Patient/family education;Balance training    OT Goals(Current goals can be found in the care plan section) Acute Rehab OT Goals Patient Stated Goal: work on balance, be able to go home but open to rehab OT Goal Formulation: With patient Time For Goal Achievement: 03/09/23 Potential to Achieve Goals: Good ADL Goals Pt Will Perform Lower Body Dressing: with set-up;sitting/lateral leans;sit to/from stand Pt Will Transfer to Toilet: with supervision;ambulating Additional ADL Goal #1: Pt to verbalize at least 3 fall prevention  strategies to implement at home Additional ADL Goal #2: Pt to locate 5/5 ADL items on L and R side without verbal  cues  OT Frequency: Min 1X/week    Co-evaluation              AM-PAC OT "6 Clicks" Daily Activity     Outcome Measure Help from another person eating meals?: A Little Help from another person taking care of personal grooming?: A Little Help from another person toileting, which includes using toliet, bedpan, or urinal?: A Lot Help from another person bathing (including washing, rinsing, drying)?: A Lot Help from another person to put on and taking off regular upper body clothing?: A Little Help from another person to put on and taking off regular lower body clothing?: A Lot 6 Click Score: 15   End of Session Equipment Utilized During Treatment: Gait belt;Rolling walker (2 wheels) Nurse Communication: Mobility status  Activity Tolerance: Patient tolerated treatment well Patient left: in chair;with call bell/phone within reach;with chair alarm set;with nursing/sitter in room  OT Visit Diagnosis: Unsteadiness on feet (R26.81);Other abnormalities of gait and mobility (R26.89);Muscle weakness (generalized) (M62.81)                Time: 6578-4696 OT Time Calculation (min): 35 min Charges:  OT General Charges $OT Visit: 1 Visit OT Evaluation $OT Eval Low Complexity: 1 Low OT Treatments $Self Care/Home Management : 8-22 mins  Bradd Canary, OTR/L Acute Rehab Services Office: [phone]   Lorre Munroe 02/23/2023, 8:19 AM

## 2023-02-23 NOTE — Progress Notes (Signed)
Inpatient Rehab Admissions Coordinator:  ? ?Per therapy recommendations,  patient was screened for CIR candidacy by Devaney Segers, MS, CCC-SLP. At this time, Pt. Appears to be a a potential candidate for CIR. I will place   order for rehab consult per protocol for full assessment. Please contact me any with questions. ? ?Trine Fread, MS, CCC-SLP ?Rehab Admissions Coordinator  ?[phone] (celll) ?[phone] (office) ? ?

## 2023-02-23 NOTE — Plan of Care (Signed)
  Problem: Metabolic: Goal: Ability to maintain appropriate glucose levels will improve Outcome: Progressing   Problem: Education: Goal: Knowledge of General Education information will improve Description: Including pain rating scale, medication(s)/side effects and non-pharmacologic comfort measures Outcome: Progressing   Problem: Activity: Goal: Risk for activity intolerance will decrease Outcome: Progressing   Problem: Safety: Goal: Ability to remain free from injury will improve Outcome: Progressing   Problem: Ischemic Stroke/TIA Tissue Perfusion: Goal: Complications of ischemic stroke/TIA will be minimized Outcome: Progressing

## 2023-02-23 NOTE — Progress Notes (Signed)
Physical Therapy Treatment Patient Details Name: Joseph Ortega MRN: [id number] DOB: 03/29/1955 Today's Date: 02/23/2023   History of Present Illness 67 y.o. male s/p fall with hemorrhagic parenchymal contusion in R temporal lobe, L posterior parietal scalp hematoma and pelvic hematoma.PMH includes Anxiety, Arthritis, Depression, Diabetes mellitus, Headache, HTN, Hypothyroidism, Sleep apnea, Stroke (left hemi).    PT Comments  Patient sleeping in chair on arrival. Reports he took pain medication due to head and back pain. Agreed to don shoes to see if this improves his stability. Does not have his AFO at the hospital. Incr time and effort to come to stand with pt excessively leaning to his rt over RLE to come to stand. Ambulated with RW x 50 ft with min assist due to numerous staggering steps to his left. Poor foot clearance and ?shoe grabbing and causing imbalance on occasion. Up to recliner while bed changed and pt then assisted back to bed due to sleepiness.     If plan is discharge home, recommend the following: A little help with walking and/or transfers;A little help with bathing/dressing/bathroom;Assistance with cooking/housework;Assist for transportation;Help with stairs or ramp for entrance   Can travel by private vehicle        Equipment Recommendations  None recommended by PT    Recommendations for Other Services OT consult     Precautions / Restrictions Precautions Precautions: Fall Precaution Comments: hx of CVA Lt hemi Required Braces or Orthoses: Other Brace Other Brace: has an AFO for Lt foot but has not been wearing much lately, not at hospital Restrictions Weight Bearing Restrictions: No     Mobility  Bed Mobility               General bed mobility comments: up in recliner; sitting EOB with RN at end of session    Transfers Overall transfer level: Needs assistance Equipment used: Rolling walker (2 wheels) Transfers: Sit to/from Stand, Bed to  chair/wheelchair/BSC Sit to Stand: Contact guard assist   Step pivot transfers: Min assist       General transfer comment: unable to stand x 10 attempts but did not want assistance;  I moved to his left side and pt able to come up as he leans excessively over his rt foot to come up (I was in his way with previous attempts--which pt was not able to recognize); +use of RW with stand-pivot from recliner to bed with imbalance noted    Ambulation/Gait Ambulation/Gait assistance: Min assist Gait Distance (Feet): 50 Feet Assistive device: Rolling walker (2 wheels) Gait Pattern/deviations: Step-to pattern, Step-through pattern, Decreased stride length, Decreased dorsiflexion - left, Shuffle, Wide base of support, Staggering left Gait velocity: dec     General Gait Details: With pt's shoes (AFO not at hospital); Hemiparetic gait, initially with poor Lt foot clearance and reduced awareness. Able to eventually correct and compensate with larger step length. No overt buckling. Several staggering steps requiring min assist to recover   Stairs             Wheelchair Mobility     Tilt Bed    Modified Rankin (Stroke Patients Only) Modified Rankin (Stroke Patients Only) Pre-Morbid Rankin Score: Moderate disability Modified Rankin: Moderately severe disability     Balance Overall balance assessment: Needs assistance Sitting-balance support: No upper extremity supported, Feet supported Sitting balance-Leahy Scale: Fair     Standing balance support: Bilateral upper extremity supported, No upper extremity supported, During functional activity Standing balance-Leahy Scale: Poor Standing balance comment: more stable with UE  support                            Cognition Arousal: Lethargic, Suspect due to medications Behavior During Therapy: WFL for tasks assessed/performed Overall Cognitive Status: No family/caregiver present to determine baseline cognitive functioning                                  General Comments: reports he had tramadol for pain and has made him very sleepy        Exercises      General Comments        Pertinent Vitals/Pain Pain Assessment Pain Assessment: Faces Faces Pain Scale: Hurts little more Pain Location: back and occiput. Pain Descriptors / Indicators: Aching, Sore Pain Intervention(s): Limited activity within patient's tolerance, Monitored during session, Premedicated before session    Home Living Family/patient expects to be discharged to:: Private residence Living Arrangements: Spouse/significant other (wife is w/c bound) Available Help at Discharge: Family;Available 24 hours/day Type of Home: House Home Access: Ramped entrance;Stairs to enter   Entrance Stairs-Number of Steps: ramp into the house then 2 steps with grab bar   Home Layout: One level Home Equipment: Rolling Walker (2 wheels);Grab bars - tub/shower;Grab bars - toilet;Rollator (4 wheels);Adaptive equipment;BSC/3in1;Shower seat - built in      Prior Function            PT Goals (current goals can now be found in the care plan section) Acute Rehab PT Goals Patient Stated Goal: Go home with wife Time For Goal Achievement: 03/08/23 Potential to Achieve Goals: Good Progress towards PT goals: Progressing toward goals    Frequency    Min 1X/week      PT Plan      Co-evaluation              AM-PAC PT "6 Clicks" Mobility   Outcome Measure  Help needed turning from your back to your side while in a flat bed without using bedrails?: None Help needed moving from lying on your back to sitting on the side of a flat bed without using bedrails?: A Little Help needed moving to and from a bed to a chair (including a wheelchair)?: A Little Help needed standing up from a chair using your arms (e.g., wheelchair or bedside chair)?: A Little Help needed to walk in hospital room?: A Little Help needed climbing 3-5 steps with a  railing? : Total 6 Click Score: 17    End of Session Equipment Utilized During Treatment: Gait belt Activity Tolerance: Patient limited by lethargy Patient left: in bed;with nursing/sitter in room (RN to give meds while pt sitting EOB) Nurse Communication: Mobility status PT Visit Diagnosis: Unsteadiness on feet (R26.81);Other abnormalities of gait and mobility (R26.89);History of falling (Z91.81);Hemiplegia and hemiparesis;Other symptoms and signs involving the nervous system (R29.898) Hemiplegia - Right/Left: Left Hemiplegia - caused by:  (Hx of Lt hemi)     Time: 3295-1884 PT Time Calculation (min) (ACUTE ONLY): 21 min  Charges:    $Gait Training: 8-22 mins PT General Charges $$ ACUTE PT VISIT: 1 Visit                      Jerolyn Center, PT Acute Rehabilitation Services  Office [phone]    Zena Amos 02/23/2023, 10:04 AM

## 2023-02-23 NOTE — Progress Notes (Signed)
eLink Physician-Brief Progress Note Patient Name: Aaidyn Hawksley DOB: 04-16-1955 MRN: [id number]   Date of Service  02/23/2023  HPI/Events of Note  67 year old male with a history of ischemic strokes with residual left-sided weakness, metabolic syndrome, chronic venous insufficiency who tripped and fell hitting the back of his head and losing consciousness found to have parenchymal contusion in the right temporal lobe versus stroke and pelvic hematoma.   Blood pressure is on the lower end, increased lethargy, was able to wake up to answer questions appropriately then fell back to sleep.  Holding trazodone and Zanaflex.  No focal neurological deficits.  Remains on nasal BiPAP without distress.  eICU Interventions  As there are no localizing symptoms, no immediate intervention is indicated.  Continue observation for the time being.  In terms of hemodynamics, he is now much less hypertensive but he did receive numerous antihypertensives.  All in all, difficult to interpret without obvious signs of sepsis.     Intervention Category Minor Interventions: Clinical assessment - ordering diagnostic tests  Jasson Siegmann 02/23/2023, 12:31 AM

## 2023-02-23 NOTE — Progress Notes (Signed)
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  Imaging showed hemorrhagic parenchymal contusion in the right temporal lobe.  He was found to be hypertensive in the ED, started on Cleviprex and admitted to the ICU.  Trauma and neurosurgery consulted.  He improved and eventually transferred to the hospitalist service on 12/7  Subjective / 24h Interval events: Doing well today, still feeling weak, no lightheadedness or dizziness  Assesement and Plan: Principal problem 13 x 13 mm hemorrhagic parenchymal contusion in the right temporal lobe -following a mechanical fall.  Underwent serial CT scans with stability.  Neurosurgery consulted and now signed off.  They recommended Keppra for 7 days, continue for now -PT recommends CIR, consult pending  Active problems Pelvic hematoma-pain management.  Trauma surgery saw patient, signed off 12/6  Gallbladder wall thickening, pericholecystic inflammatory changes-he is asymptomatic, ultrasound and HIDA scan were both negative.  Surgery signed off  History of ischemic strokes-hold aspirin for now  Hypertensive urgency-initially required Cleviprex drip to maintain systolic less than 160.  His blood pressure is actually soft this morning  Hypothyroidism-continue Synthroid  Hyperlipidemia-continue statin  OSA-continue CPAP  Lymphedema with chronic venous insufficiency, left leg superficial clean ulcer - wound care  Left foot tinea pedis-clotrimazole  DM2, poorly controlled, with hyperglycemia-continue sliding scale, long-acting insulin  Lab  Results  Component Value Date   HGBA1C 10.1 (H) 02/22/2023   CBG (last 3)  Recent Labs    02/23/23 0001 02/23/23 0329 02/23/23 0806  GLUCAP 204* 216* 281*    Scheduled Meds:  busPIRone  10 mg Oral TID   carvedilol  12.5 mg Oral BID WC   Chlorhexidine Gluconate Cloth  6 each Topical Q2200   clotrimazole   Topical BID   docusate sodium  100 mg Oral BID   DULoxetine  30 mg Oral BID   gabapentin  300 mg Oral TID   hydrochlorothiazide  12.5 mg Oral BID   insulin aspart  0-15 Units Subcutaneous Q4H   insulin detemir  20 Units Subcutaneous QHS   levETIRAcetam  500 mg Oral BID   levothyroxine  125 mcg Oral Q0600   mupirocin ointment  1 Application Nasal BID   rosuvastatin  40 mg Oral Daily   Tdap  0.5 mL Intramuscular Once   topiramate  50 mg Oral Daily   traZODone  100 mg Oral QHS   Continuous Infusions: PRN Meds:.acetaminophen, morphine injection, mouth rinse, polyethylene glycol, tiZANidine, traMADol  Current Outpatient Medications  Medication Instructions   aspirin EC 81 mg, Oral, Daily, Swallow whole.   busPIRone (BUSPAR) 10 mg, Oral, 3 times daily   carvedilol (COREG) 12.5 mg, Oral, 2 times daily with meals   diphenhydrAMINE (BENADRYL) 25 mg, Oral, Every 6 hours PRN   docusate sodium (COLACE) 100 mg, Oral, 2 times daily   DULoxetine (CYMBALTA) 30 mg, Oral, 2 times daily   Farxiga 10 mg, Oral, Daily   gabapentin (NEURONTIN) 300 mg, Oral, 3 times daily   guanFACINE (INTUNIV) 2 mg, Oral, Daily   hydrochlorothiazide (HYDRODIURIL) 12.5 mg, Oral, 2 times daily   insulin aspart (NOVOLOG) 0-15 Units, Subcutaneous, 3  times daily before meals, Maximum of 20 units   insulin detemir (LEVEMIR) 40 Units, Subcutaneous, Daily at bedtime   KLOR-CON M20 20 MEQ tablet 20 mEq, Oral, Daily PRN   levothyroxine (SYNTHROID) 125 mcg, Oral, Daily   Multiple Vitamins-Minerals (ALIVE MENS GUMMY MULTIVITAMINS) CHEW 3 tablets, Oral, Daily   rosuvastatin (CRESTOR) 40 mg, Oral, Daily   tiZANidine  (ZANAFLEX) 2 mg, Every 8 hours PRN   topiramate (TOPAMAX) 50 mg, Oral, Daily   traMADol (ULTRAM) 50 mg, Oral, Every 8 hours PRN   traZODone (DESYREL) 100 mg, Oral, Daily at bedtime   Trulicity 3 mg, Subcutaneous, Weekly    Diet Orders (From admission, onward)     Start     Ordered   02/22/23 0847  Diet Heart Room service appropriate? Yes; Fluid consistency: Thin  Diet effective now       Question Answer Comment  Room service appropriate? Yes   Fluid consistency: Thin      02/22/23 0846            DVT prophylaxis: SCDs Start: 02/22/23 0134   Lab Results  Component Value Date   PLT 230 02/22/2023      Code Status: Full Code  Family Communication: no family at bedside   Status is: Observation The patient will require care spanning > 2 midnights and should be moved to inpatient because: unsafe to go home, needs rehab   Level of care: Telemetry Medical  Consultants:  Neurosurgery PCCM  Objective: Vitals:   02/22/23 2343 02/23/23 0310 02/23/23 0313 02/23/23 0811  BP: (!) 97/56 (!) 116/57  (!) 107/56  Pulse: (!) 55 (!) 57  (!) 55  Resp: 16   17  Temp: 97.6 F (36.4 C)   99.2 F (37.3 C)  TempSrc: Oral   Oral  SpO2: 96% 97%  94%  Weight:   107.6 kg    No intake or output data in the 24 hours ending 02/23/23 0949 Wt Readings from Last 3 Encounters:  02/23/23 107.6 kg  11/23/22 105.2 kg  11/15/22 105.7 kg    Examination:  Constitutional: NAD Eyes: no scleral icterus ENMT: Mucous membranes are moist.  Neck: normal, supple Respiratory: clear to auscultation bilaterally, no wheezing, no crackles. Normal respiratory effort.  Cardiovascular: Regular rate and rhythm, no murmurs / rubs / gallops Abdomen: non distended, no tenderness. Bowel sounds positive.  Musculoskeletal: no clubbing / cyanosis.   Data Reviewed: I have independently reviewed following labs and imaging studies   CBC Recent Labs  Lab 02/21/23 2115 02/21/23 2134 02/22/23 0834  WBC 8.3   --  7.2  HGB 15.1 15.6 13.8  HCT 46.0 46.0 40.7  PLT 231  --  230  MCV 93.7  --  93.3  MCH 30.8  --  31.7  MCHC 32.8  --  33.9  RDW 14.4  --  14.5  LYMPHSABS 0.9  --   --   MONOABS 0.5  --   --   EOSABS 0.1  --   --   BASOSABS 0.1  --   --     Recent Labs  Lab 02/21/23 2115 02/21/23 2134 02/22/23 0834  NA 140 143 143  K 4.1 4.0 3.3*  CL 105 106 107  CO2 25  --  26  GLUCOSE 284* 292* 188*  BUN 24* 26* 20  CREATININE 1.08 1.10 0.89  CALCIUM 9.6  --  9.0  AST 25  --  17  ALT 10  --  10  ALKPHOS  148*  --  96  BILITOT 1.0  --  0.9  ALBUMIN 3.8  --  3.0*  MG 2.1  --   --   HGBA1C  --   --  10.1*  BNP 732.6*  --   --     ------------------------------------------------------------------------------------------------------------------ No results for input(s): "CHOL", "HDL", "LDLCALC", "TRIG", "CHOLHDL", "LDLDIRECT" in the last 72 hours.  Lab Results  Component Value Date   HGBA1C 10.1 (H) 02/22/2023   ------------------------------------------------------------------------------------------------------------------ No results for input(s): "TSH", "T4TOTAL", "T3FREE", "THYROIDAB" in the last 72 hours.  Invalid input(s): "FREET3"  Cardiac Enzymes No results for input(s): "CKMB", "TROPONINI", "MYOGLOBIN" in the last 168 hours.  Invalid input(s): "CK" ------------------------------------------------------------------------------------------------------------------    Component Value Date/Time   BNP 732.6 (H) 02/21/2023 2115    CBG: Recent Labs  Lab 02/22/23 1630 02/22/23 2132 02/23/23 0001 02/23/23 0329 02/23/23 0806  GLUCAP 204* 216* 204* 216* 281*    Recent Results (from the past 240 hour(s))  MRSA Next Gen by PCR, Nasal     Status: Abnormal   Collection Time: 02/22/23  2:42 AM   Specimen: Nasal Mucosa; Nasal Swab  Result Value Ref Range Status   MRSA by PCR Next Gen DETECTED (A) NOT DETECTED Final    Comment: RESULT CALLED TO, READ BACK BY AND  VERIFIED WITH: J WOODY,RN@0505  02/22/23 MK (NOTE) The GeneXpert MRSA Assay (FDA approved for NASAL specimens only), is one component of a comprehensive MRSA colonization surveillance program. It is not intended to diagnose MRSA infection nor to guide or monitor treatment for MRSA infections. Test performance is not FDA approved in patients less than 15 years old. Performed at [facility], [address], [place], [place] [address]      Radiology Studies: ECHOCARDIOGRAM COMPLETE  Result Date: 02/22/2023    ECHOCARDIOGRAM REPORT   Patient Name:   SYLVAIN BROCCOLI Date of Exam: 02/22/2023 Medical Rec #:  [id number]    Height:       73.0 in Accession #:    [id number]   Weight:       229.3 lb Date of Birth:  06/20/1955    BSA:          2.280 m Patient Age:    67 years     BP:           150/75 mmHg Patient Gender: M            HR:           61 bpm. Exam Location:  Inpatient Procedure: 2D Echo, 3D Echo, Color Doppler, Cardiac Doppler, Strain Analysis and            Intracardiac Opacification Agent Indications:    Stroke I63.9  History:        Patient has prior history of Echocardiogram examinations, most                 recent 05/03/2022. Stroke; Risk Factors:Hypertension, Diabetes,                 Dyslipidemia and Former Smoker.  Sonographer:    Dondra Prader RVT RCS Referring Phys: [id number] OMAR M ALBUSTAMI IMPRESSIONS  1. No apical sparing with GLS. Left ventricular ejection fraction, by estimation, is 55 to 60%. The left ventricle has normal function. The left ventricle has no regional wall motion abnormalities. There is severe concentric left ventricular hypertrophy. Left ventricular diastolic parameters are consistent with Grade I diastolic dysfunction (impaired relaxation). The average left ventricular global longitudinal strain is -  10.0 %. The global longitudinal strain is abnormal.  2. Right ventricular systolic function is normal. The right ventricular size is normal. Mildly increased right  ventricular wall thickness. There is normal pulmonary artery systolic pressure. The estimated right ventricular systolic pressure is 25.5 mmHg.  3. Left atrial size was mildly dilated.  4. Moderate pericardial effusion. There is no evidence of cardiac tamponade.  5. The mitral valve is grossly normal. Trivial mitral valve regurgitation.  6. The aortic valve was not well visualized. Aortic valve regurgitation is not visualized.  7. The inferior vena cava is dilated in size with <50% respiratory variability, suggesting right atrial pressure of 15 mmHg. Conclusion(s)/Recommendation(s): Has significant LVH, consider CMR. FINDINGS  Left Ventricle: No apical sparing with GLS. Left ventricular ejection fraction, by estimation, is 55 to 60%. The left ventricle has normal function. The left ventricle has no regional wall motion abnormalities. Definity contrast agent was given IV to delineate the left ventricular endocardial borders. The average left ventricular global longitudinal strain is -10.0 %. The global longitudinal strain is abnormal. The left ventricular internal cavity size was normal in size. There is severe concentric left ventricular hypertrophy. Left ventricular diastolic parameters are consistent with Grade I diastolic dysfunction (impaired relaxation). Right Ventricle: The right ventricular size is normal. Mildly increased right ventricular wall thickness. Right ventricular systolic function is normal. There is normal pulmonary artery systolic pressure. The tricuspid regurgitant velocity is 1.62 m/s, and with an assumed right atrial pressure of 15 mmHg, the estimated right ventricular systolic pressure is 25.5 mmHg. Left Atrium: Left atrial size was mildly dilated. Right Atrium: Right atrial size was normal in size. Pericardium: A moderately sized pericardial effusion is present. There is no evidence of cardiac tamponade. Mitral Valve: The mitral valve is grossly normal. Trivial mitral valve regurgitation.  Tricuspid Valve: Tricuspid valve regurgitation is trivial. Aortic Valve: The aortic valve was not well visualized. Aortic valve regurgitation is not visualized. Aortic valve mean gradient measures 3.0 mmHg. Aortic valve peak gradient measures 6.1 mmHg. Aortic valve area, by VTI measures 2.30 cm. Pulmonic Valve: Pulmonic valve regurgitation is not visualized. Aorta: The aortic root and ascending aorta are structurally normal, with no evidence of dilitation. Venous: The inferior vena cava is dilated in size with less than 50% respiratory variability, suggesting right atrial pressure of 15 mmHg. IAS/Shunts: No atrial level shunt detected by color flow Doppler.  LEFT VENTRICLE PLAX 2D LVIDd:         4.80 cm      Diastology LVIDs:         3.20 cm      LV e' medial:    5.19 cm/s LV PW:         1.90 cm      LV E/e' medial:  14.5 LV IVS:        1.70 cm      LV e' lateral:   4.14 cm/s LVOT diam:     1.90 cm      LV E/e' lateral: 18.2 LV SV:         67 LV SV Index:   29           2D Longitudinal Strain LVOT Area:     2.84 cm     2D Strain GLS Avg:     -10.0 %  LV Volumes (MOD) LV vol d, MOD A2C: 149.0 ml 3D Volume EF: LV vol d, MOD A4C: 244.0 ml 3D EF:        49 %  LV vol s, MOD A2C: 59.6 ml  LV EDV:       222 ml LV vol s, MOD A4C: 77.8 ml  LV ESV:       114 ml LV SV MOD A2C:     89.4 ml  LV SV:        108 ml LV SV MOD A4C:     244.0 ml LV SV MOD BP:      121.6 ml RIGHT VENTRICLE            IVC RV Basal diam:  3.80 cm    IVC diam: 2.20 cm RV S prime:     9.43 cm/s TAPSE (M-mode): 2.9 cm LEFT ATRIUM             Index        RIGHT ATRIUM           Index LA diam:        4.80 cm 2.11 cm/m   RA Area:     17.70 cm LA Vol (A2C):   82.0 ml 35.96 ml/m  RA Volume:   47.20 ml  20.70 ml/m LA Vol (A4C):   75.4 ml 33.07 ml/m LA Biplane Vol: 78.7 ml 34.51 ml/m  AORTIC VALVE                    PULMONIC VALVE AV Area (Vmax):    2.25 cm     PV Vmax:       0.81 m/s AV Area (Vmean):   2.18 cm     PV Peak grad:  2.6 mmHg AV Area (VTI):      2.30 cm AV Vmax:           123.00 cm/s AV Vmean:          79.800 cm/s AV VTI:            0.291 m AV Peak Grad:      6.1 mmHg AV Mean Grad:      3.0 mmHg LVOT Vmax:         97.50 cm/s LVOT Vmean:        61.300 cm/s LVOT VTI:          0.236 m LVOT/AV VTI ratio: 0.81  AORTA Ao Root diam: 3.20 cm Ao Asc diam:  3.30 cm MITRAL VALVE               TRICUSPID VALVE MV Area (PHT): 3.10 cm    TR Peak grad:   10.5 mmHg MV Decel Time: 245 msec    TR Vmax:        162.00 cm/s MV E velocity: 75.50 cm/s MV A velocity: 91.70 cm/s  SHUNTS MV E/A ratio:  0.82        Systemic VTI:  0.24 m                            Systemic Diam: 1.90 cm Carolan Clines Electronically signed by Carolan Clines Signature Date/Time: 02/22/2023/1:53:29 PM    Final    NM Hepatobiliary Liver Func  Result Date: 02/22/2023 CLINICAL DATA:  RIGHT upper quadrant pain. EXAM: NUCLEAR MEDICINE HEPATOBILIARY IMAGING TECHNIQUE: Sequential images of the abdomen were obtained out to 60 minutes following intravenous administration of radiopharmaceutical. RADIOPHARMACEUTICALS:  5.0 mCi Tc-47m  Choletec IV COMPARISON:  Ultrasound 02/22/2023 FINDINGS: Prompt clearance radiotracer from blood pool and homogeneous uptake in liver. Counts are evident in the small bowel by 25  minutes. At 60 minutes, gallbladder has not filled. 3 mg IV morphine was administered to augment filling of the gallbladder. Post morphine augmentation the gallbladder fills. IMPRESSION: 1. Patent cystic duct and common bile duct. 2. Gallbladder begins to fill after morphine challenge. Electronically Signed   By: Genevive Bi M.D.   On: 02/22/2023 12:20   CT HEAD WO CONTRAST ( )  Result Date: 02/22/2023 CLINICAL DATA:  Provided history: Head trauma, moderate/severe. Additional history provided: Fall. EXAM: CT HEAD WITHOUT CONTRAST TECHNIQUE: Contiguous axial images were obtained from the base of the skull through the vertex without intravenous contrast. RADIATION DOSE REDUCTION: This exam was  performed according to the departmental dose-optimization program which includes automated exposure control, adjustment of the mA and/or kV according to patient size and/or use of iterative reconstruction technique. COMPARISON:  Prior head CT examinations 02/21/2023 and earlier. FINDINGS: Brain: Mild generalized cerebral volume loss. 13 x 13 mm acute parenchymal hemorrhage in the anterior right subinsular region with mild surrounding edema, unchanged from the prior head CT of 02/21/2023. No interval acute intracranial hemorrhage is identified. Large chronic right MCA/PCA territory cortical/subcortical infarct within the posterior right frontal lobe, right parietal lobe, right temporal lobe and right occipital lobe. Chronic infarct within the adjacent right frontoparietal white matter. Background mild-to-moderate patchy and ill-defined hypoattenuation within the cerebral white matter, nonspecific but compatible with chronic small vessel ischemic disease. No acute demarcated cortical infarct. No extra-axial fluid collection. No evidence of an intracranial mass. No midline shift. Vascular: No hyperdense vessel.  Atherosclerotic calcifications. Skull: A portion of the high parietal calvarium is excluded from the field of view superiorly. No acute fracture or aggressive osseous lesion at the imaged levels. Sinuses/Orbits: No orbital mass or acute orbital finding. No significant paranasal sinus disease at the imaged levels. Other: Left parietal scalp hematoma. 14 mm ovoid cystic appearing cutaneous/subcutaneous lesion overlying the zygomatic process of the right temporal bone (series 3, image 4). IMPRESSION: 1. 13 x 13 mm acute parenchymal hemorrhage in the anterior right subinsular region with mild surrounding edema, unchanged from the prior head CT of 02/21/2023. 2. Left parietal scalp hematoma again noted. Please note, a portion of the high parietal calvarium is excluded from the field of view on this examination. 3.  Parenchymal atrophy, chronic small vessel ischemic disease and chronic infarcts. 4. Nonspecific 14 mm cystic appearing cutaneous/subcutaneous lesion overlying the zygomatic process of the right temporal bone. Correlate with the physical exam findings. Electronically Signed   By: Jackey Loge D.O.   On: 02/22/2023 10:20     Pamella Pert, MD, PhD [facility]  Between 7 am - 7 pm I am available, please contact me via Amion (for emergencies) or Securechat (non urgent messages)  Between 7 pm - 7 am I am not available, please contact night coverage MD/APP via Amion

## 2023-02-24 DIAGNOSIS — E1149 Type 2 diabetes mellitus with other diabetic neurological complication: Secondary | ICD-10-CM | POA: Diagnosis not present

## 2023-02-24 DIAGNOSIS — L97929 Non-pressure chronic ulcer of unspecified part of left lower leg with unspecified severity: Secondary | ICD-10-CM | POA: Diagnosis present

## 2023-02-24 DIAGNOSIS — S062XAD Diffuse traumatic brain injury with loss of consciousness status unknown, subsequent encounter: Secondary | ICD-10-CM | POA: Diagnosis not present

## 2023-02-24 DIAGNOSIS — S069X1S Unspecified intracranial injury with loss of consciousness of 30 minutes or less, sequela: Secondary | ICD-10-CM | POA: Diagnosis not present

## 2023-02-24 DIAGNOSIS — E039 Hypothyroidism, unspecified: Secondary | ICD-10-CM | POA: Diagnosis present

## 2023-02-24 DIAGNOSIS — W010XXA Fall on same level from slipping, tripping and stumbling without subsequent striking against object, initial encounter: Secondary | ICD-10-CM | POA: Diagnosis present

## 2023-02-24 DIAGNOSIS — I890 Lymphedema, not elsewhere classified: Secondary | ICD-10-CM | POA: Diagnosis present

## 2023-02-24 DIAGNOSIS — R32 Unspecified urinary incontinence: Secondary | ICD-10-CM | POA: Diagnosis present

## 2023-02-24 DIAGNOSIS — S069X1A Unspecified intracranial injury with loss of consciousness of 30 minutes or less, initial encounter: Secondary | ICD-10-CM | POA: Diagnosis not present

## 2023-02-24 DIAGNOSIS — Z833 Family history of diabetes mellitus: Secondary | ICD-10-CM | POA: Diagnosis not present

## 2023-02-24 DIAGNOSIS — S069X1D Unspecified intracranial injury with loss of consciousness of 30 minutes or less, subsequent encounter: Secondary | ICD-10-CM | POA: Diagnosis not present

## 2023-02-24 DIAGNOSIS — F32A Depression, unspecified: Secondary | ICD-10-CM | POA: Diagnosis present

## 2023-02-24 DIAGNOSIS — F320 Major depressive disorder, single episode, mild: Secondary | ICD-10-CM | POA: Diagnosis not present

## 2023-02-24 DIAGNOSIS — I169 Hypertensive crisis, unspecified: Secondary | ICD-10-CM | POA: Diagnosis present

## 2023-02-24 DIAGNOSIS — G9389 Other specified disorders of brain: Secondary | ICD-10-CM | POA: Diagnosis present

## 2023-02-24 DIAGNOSIS — S062XAA Diffuse traumatic brain injury with loss of consciousness status unknown, initial encounter: Secondary | ICD-10-CM | POA: Diagnosis present

## 2023-02-24 DIAGNOSIS — E785 Hyperlipidemia, unspecified: Secondary | ICD-10-CM | POA: Diagnosis present

## 2023-02-24 DIAGNOSIS — I119 Hypertensive heart disease without heart failure: Secondary | ICD-10-CM | POA: Diagnosis present

## 2023-02-24 DIAGNOSIS — Z79890 Hormone replacement therapy (postmenopausal): Secondary | ICD-10-CM | POA: Diagnosis not present

## 2023-02-24 DIAGNOSIS — I10 Essential (primary) hypertension: Secondary | ICD-10-CM | POA: Diagnosis not present

## 2023-02-24 DIAGNOSIS — I619 Nontraumatic intracerebral hemorrhage, unspecified: Secondary | ICD-10-CM | POA: Diagnosis present

## 2023-02-24 DIAGNOSIS — Z794 Long term (current) use of insulin: Secondary | ICD-10-CM | POA: Diagnosis not present

## 2023-02-24 DIAGNOSIS — M961 Postlaminectomy syndrome, not elsewhere classified: Secondary | ICD-10-CM | POA: Diagnosis not present

## 2023-02-24 DIAGNOSIS — E876 Hypokalemia: Secondary | ICD-10-CM | POA: Diagnosis present

## 2023-02-24 DIAGNOSIS — F4323 Adjustment disorder with mixed anxiety and depressed mood: Secondary | ICD-10-CM | POA: Diagnosis not present

## 2023-02-24 DIAGNOSIS — G4733 Obstructive sleep apnea (adult) (pediatric): Secondary | ICD-10-CM | POA: Diagnosis present

## 2023-02-24 DIAGNOSIS — Z7982 Long term (current) use of aspirin: Secondary | ICD-10-CM | POA: Diagnosis not present

## 2023-02-24 DIAGNOSIS — Z79899 Other long term (current) drug therapy: Secondary | ICD-10-CM | POA: Diagnosis not present

## 2023-02-24 DIAGNOSIS — K5901 Slow transit constipation: Secondary | ICD-10-CM | POA: Diagnosis not present

## 2023-02-24 DIAGNOSIS — S069X9A Unspecified intracranial injury with loss of consciousness of unspecified duration, initial encounter: Secondary | ICD-10-CM | POA: Diagnosis not present

## 2023-02-24 DIAGNOSIS — Z23 Encounter for immunization: Secondary | ICD-10-CM | POA: Diagnosis present

## 2023-02-24 DIAGNOSIS — S069X9S Unspecified intracranial injury with loss of consciousness of unspecified duration, sequela: Secondary | ICD-10-CM | POA: Diagnosis not present

## 2023-02-24 DIAGNOSIS — I160 Hypertensive urgency: Secondary | ICD-10-CM | POA: Diagnosis present

## 2023-02-24 DIAGNOSIS — E1165 Type 2 diabetes mellitus with hyperglycemia: Secondary | ICD-10-CM | POA: Diagnosis present

## 2023-02-24 DIAGNOSIS — B353 Tinea pedis: Secondary | ICD-10-CM | POA: Diagnosis present

## 2023-02-24 DIAGNOSIS — Z7985 Long-term (current) use of injectable non-insulin antidiabetic drugs: Secondary | ICD-10-CM | POA: Diagnosis not present

## 2023-02-24 DIAGNOSIS — I872 Venous insufficiency (chronic) (peripheral): Secondary | ICD-10-CM | POA: Diagnosis present

## 2023-02-24 DIAGNOSIS — I69354 Hemiplegia and hemiparesis following cerebral infarction affecting left non-dominant side: Secondary | ICD-10-CM | POA: Diagnosis not present

## 2023-02-24 LAB — CBC
HCT: 38 % — ABNORMAL LOW (ref 39.0–52.0)
Hemoglobin: 12.7 g/dL — ABNORMAL LOW (ref 13.0–17.0)
MCH: 32.1 pg (ref 26.0–34.0)
MCHC: 33.4 g/dL (ref 30.0–36.0)
MCV: 96 fL (ref 80.0–100.0)
Platelets: 191 10*3/uL (ref 150–400)
RBC: 3.96 MIL/uL — ABNORMAL LOW (ref 4.22–5.81)
RDW: 14.6 % (ref 11.5–15.5)
WBC: 7.4 10*3/uL (ref 4.0–10.5)
nRBC: 0 % (ref 0.0–0.2)

## 2023-02-24 LAB — COMPREHENSIVE METABOLIC PANEL
ALT: 10 U/L (ref 0–44)
AST: 14 U/L — ABNORMAL LOW (ref 15–41)
Albumin: 2.8 g/dL — ABNORMAL LOW (ref 3.5–5.0)
Alkaline Phosphatase: 75 U/L (ref 38–126)
Anion gap: 7 (ref 5–15)
BUN: 28 mg/dL — ABNORMAL HIGH (ref 8–23)
CO2: 30 mmol/L (ref 22–32)
Calcium: 8.8 mg/dL — ABNORMAL LOW (ref 8.9–10.3)
Chloride: 102 mmol/L (ref 98–111)
Creatinine, Ser: 1.24 mg/dL (ref 0.61–1.24)
GFR, Estimated: >60 mL/min (ref >60)
Glucose, Bld: 126 mg/dL — ABNORMAL HIGH (ref 70–99)
Potassium: 3.8 mmol/L (ref 3.5–5.1)
Sodium: 139 mmol/L (ref 135–145)
Total Bilirubin: 0.9 mg/dL (ref <1.2)
Total Protein: 5.9 g/dL — ABNORMAL LOW (ref 6.5–8.1)

## 2023-02-24 LAB — GLUCOSE, CAPILLARY
Glucose-Capillary: 122 mg/dL — ABNORMAL HIGH (ref 70–99)
Glucose-Capillary: 126 mg/dL — ABNORMAL HIGH (ref 70–99)
Glucose-Capillary: 183 mg/dL — ABNORMAL HIGH (ref 70–99)
Glucose-Capillary: 200 mg/dL — ABNORMAL HIGH (ref 70–99)
Glucose-Capillary: 245 mg/dL — ABNORMAL HIGH (ref 70–99)
Glucose-Capillary: 249 mg/dL — ABNORMAL HIGH (ref 70–99)

## 2023-02-24 LAB — MAGNESIUM: Magnesium: 2 mg/dL (ref 1.7–2.4)

## 2023-02-24 NOTE — Progress Notes (Signed)
   02/24/23 2115  BiPAP/CPAP/SIPAP  $ Non-Invasive Ventilator  Non-Invasive Vent Subsequent  BiPAP/CPAP/SIPAP Pt Type Adult  BiPAP/CPAP/SIPAP Resmed  Mask Type Full face mask  Mask Size Medium  IPAP 12 cmH20  EPAP 6 cmH2O  FiO2 (%) 21 %  Patient Home Equipment No  BiPAP/CPAP /SiPAP Vitals  SpO2 93 %

## 2023-02-24 NOTE — Progress Notes (Signed)
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  Imaging showed hemorrhagic parenchymal contusion in the right temporal lobe.  He was found to be hypertensive in the ED, started on Cleviprex and admitted to the ICU.  Trauma and neurosurgery consulted.  He improved and eventually transferred to the hospitalist service on 12/7  Subjective / 24h Interval events: Is doing overall very well, denies any chest pain, denies any shortness of breath.  Slept well  Assesement and Plan: Principal problem 13 x 13 mm hemorrhagic parenchymal contusion in the right temporal lobe -following a mechanical fall.  Underwent serial CT scans with stability.  Neurosurgery consulted and now signed off.  They recommended Keppra for 7 days, continue for now -PT recommends CIR, consult pending, stable to discharge to CIR when a bed becomes available  Active problems Pelvic hematoma-pain management.  Trauma surgery saw patient, signed off 12/6  Gallbladder wall thickening, pericholecystic inflammatory changes-he is asymptomatic, ultrasound and HIDA scan were both negative.  Surgery signed off.  Remains without symptoms  History of ischemic strokes-hold aspirin for now  Hypertensive urgency-initially required Cleviprex drip to maintain systolic less than 160.  Blood pressure stable and well-controlled  Hypothyroidism-continue Synthroid  Hyperlipidemia-continue statin  OSA-continue CPAP  Lymphedema with chronic venous insufficiency, left leg superficial clean ulcer - wound care  Left foot tinea  pedis-clotrimazole  DM2, poorly controlled, with hyperglycemia-continue sliding scale, long-acting insulin.  CBGs overall acceptable  Lab Results  Component Value Date   HGBA1C 10.1 (H) 02/22/2023   CBG (last 3)  Recent Labs    02/24/23 0351 02/24/23 0757 02/24/23 1145  GLUCAP 122* 126* 200*    Scheduled Meds:  busPIRone  10 mg Oral TID   carvedilol  12.5 mg Oral BID WC   Chlorhexidine Gluconate Cloth  6 each Topical Q2200   clotrimazole   Topical BID   docusate sodium  100 mg Oral BID   DULoxetine  30 mg Oral BID   gabapentin  300 mg Oral TID   hydrochlorothiazide  12.5 mg Oral BID   insulin aspart  0-15 Units Subcutaneous Q4H   insulin detemir  20 Units Subcutaneous QHS   levETIRAcetam  500 mg Oral BID   levothyroxine  125 mcg Oral Q0600   mupirocin ointment  1 Application Nasal BID   rosuvastatin  40 mg Oral Daily   topiramate  50 mg Oral Daily   traZODone  100 mg Oral QHS   Continuous Infusions: PRN Meds:.acetaminophen, morphine injection, mouth rinse, polyethylene glycol, tiZANidine, traMADol  Current Outpatient Medications  Medication Instructions   aspirin EC 81 mg, Oral, Daily, Swallow whole.   busPIRone (BUSPAR) 10 mg, Oral, 3 times daily   carvedilol (COREG) 12.5 mg, Oral, 2 times daily with meals   diphenhydrAMINE (BENADRYL) 25 mg, Oral, Every 6 hours PRN   docusate sodium (COLACE) 100 mg, Oral, 2 times daily   DULoxetine (CYMBALTA) 30 mg, Oral, 2 times daily   Farxiga 10 mg, Oral, Daily   gabapentin (NEURONTIN) 300 mg, Oral, 3 times daily   guanFACINE (INTUNIV) 2 mg, Oral, Daily   hydrochlorothiazide (HYDRODIURIL) 12.5  mg, Oral, 2 times daily   insulin aspart (NOVOLOG) 0-15 Units, Subcutaneous, 3 times daily before meals, Maximum of 20 units   insulin detemir (LEVEMIR) 40 Units, Subcutaneous, Daily at bedtime   KLOR-CON M20 20 MEQ tablet 20 mEq, Oral, Daily PRN   levothyroxine (SYNTHROID) 125 mcg, Oral, Daily   Multiple Vitamins-Minerals (ALIVE MENS  GUMMY MULTIVITAMINS) CHEW 3 tablets, Oral, Daily   rosuvastatin (CRESTOR) 40 mg, Oral, Daily   tiZANidine (ZANAFLEX) 2 mg, Every 8 hours PRN   topiramate (TOPAMAX) 50 mg, Oral, Daily   traMADol (ULTRAM) 50 mg, Oral, Every 8 hours PRN   traZODone (DESYREL) 100 mg, Oral, Daily at bedtime   Trulicity 3 mg, Subcutaneous, Weekly    Diet Orders (From admission, onward)     Start     Ordered   02/22/23 0847  Diet Heart Room service appropriate? Yes; Fluid consistency: Thin  Diet effective now       Question Answer Comment  Room service appropriate? Yes   Fluid consistency: Thin      02/22/23 0846            DVT prophylaxis: SCDs Start: 02/22/23 0134   Lab Results  Component Value Date   PLT 191 02/24/2023      Code Status: Full Code  Family Communication: no family at bedside   Status is: Inpatient   Level of care: Telemetry Medical  Consultants:  Neurosurgery PCCM  Objective: Vitals:   02/24/23 0353 02/24/23 0459 02/24/23 0756 02/24/23 1144  BP: 115/62  (!) 121/59 115/65  Pulse: 60  (!) 56 62  Resp: 18  17 17   Temp: 98.6 F (37 C)  97.9 F (36.6 C) 98.2 F (36.8 C)  TempSrc: Axillary  Oral Oral  SpO2: 99%  96% 94%  Weight:  107.2 kg      Intake/Output Summary (Last 24 hours) at 02/24/2023 1158 Last data filed at 02/24/2023 0900 Gross per 24 hour  Intake 499 ml  Output 950 ml  Net -451 ml   Wt Readings from Last 3 Encounters:  02/24/23 107.2 kg  11/23/22 105.2 kg  11/15/22 105.7 kg    Examination:  Constitutional: NAD Eyes: lids and conjunctivae normal, no scleral icterus ENMT: mmm Neck: normal, supple Respiratory: clear to auscultation bilaterally, no wheezing, no crackles. Normal respiratory effort.  Cardiovascular: Regular rate and rhythm, no murmurs / rubs / gallops. No LE edema. Abdomen: soft, no distention, no tenderness. Bowel sounds positive.   Data Reviewed: I have independently reviewed following labs and imaging studies    CBC Recent Labs  Lab 02/21/23 2115 02/21/23 2134 02/22/23 0834 02/24/23 0643  WBC 8.3  --  7.2 7.4  HGB 15.1 15.6 13.8 12.7*  HCT 46.0 46.0 40.7 38.0*  PLT 231  --  230 191  MCV 93.7  --  93.3 96.0  MCH 30.8  --  31.7 32.1  MCHC 32.8  --  33.9 33.4  RDW 14.4  --  14.5 14.6  LYMPHSABS 0.9  --   --   --   MONOABS 0.5  --   --   --   EOSABS 0.1  --   --   --   BASOSABS 0.1  --   --   --     Recent Labs  Lab 02/21/23 2115 02/21/23 2134 02/22/23 0834 02/24/23 0643  NA 140 143 143 139  K 4.1 4.0 3.3* 3.8  CL 105 106 107 102  CO2 25  --  26 30  GLUCOSE 284* 292* 188* 126*  BUN 24* 26* 20 28*  CREATININE 1.08 1.10 0.89 1.24  CALCIUM 9.6  --  9.0 8.8*  AST 25  --  17 14*  ALT 10  --  10 10  ALKPHOS 148*  --  96 75  BILITOT 1.0  --  0.9 0.9  ALBUMIN 3.8  --  3.0* 2.8*  MG 2.1  --   --  2.0  HGBA1C  --   --  10.1*  --   BNP 732.6*  --   --   --     ------------------------------------------------------------------------------------------------------------------ No results for input(s): "CHOL", "HDL", "LDLCALC", "TRIG", "CHOLHDL", "LDLDIRECT" in the last 72 hours.  Lab Results  Component Value Date   HGBA1C 10.1 (H) 02/22/2023   ------------------------------------------------------------------------------------------------------------------ No results for input(s): "TSH", "T4TOTAL", "T3FREE", "THYROIDAB" in the last 72 hours.  Invalid input(s): "FREET3"  Cardiac Enzymes No results for input(s): "CKMB", "TROPONINI", "MYOGLOBIN" in the last 168 hours.  Invalid input(s): "CK" ------------------------------------------------------------------------------------------------------------------    Component Value Date/Time   BNP 732.6 (H) 02/21/2023 2115    CBG: Recent Labs  Lab 02/23/23 1956 02/23/23 2353 02/24/23 0351 02/24/23 0757 02/24/23 1145  GLUCAP 218* 179* 122* 126* 200*    Recent Results (from the past 240 hour(s))  MRSA Next Gen by PCR, Nasal      Status: Abnormal   Collection Time: 02/22/23  2:42 AM   Specimen: Nasal Mucosa; Nasal Swab  Result Value Ref Range Status   MRSA by PCR Next Gen DETECTED (A) NOT DETECTED Final    Comment: RESULT CALLED TO, READ BACK BY AND VERIFIED WITH: J WOODY,RN@0505  02/22/23 MK (NOTE) The GeneXpert MRSA Assay (FDA approved for NASAL specimens only), is one component of a comprehensive MRSA colonization surveillance program. It is not intended to diagnose MRSA infection nor to guide or monitor treatment for MRSA infections. Test performance is not FDA approved in patients less than 58 years old. Performed at [facility], [address] [place], [place] [address]      Radiology Studies: No results found.   Pamella Pert, MD, PhD [facility]  Between 7 am - 7 pm I am available, please contact me via Amion (for emergencies) or Securechat (non urgent messages)  Between 7 pm - 7 am I am not available, please contact night coverage MD/APP via Amion

## 2023-02-24 NOTE — PMR Pre-admission (Signed)
PMR Admission Coordinator Pre-Admission Assessment  Patient: Joseph Ortega is an 67 y.o., male MRN: [id number] DOB: Aug 29, 1955 Height:   Weight: 107.2 kg  Insurance Information HMO: yes    PPO:      PCP:      IPA:      80/20:      OTHER:  PRIMARY: [employer]      Policy#: [id number]      Subscriber: Pt.  CM Name:   Danella Penton    Phone#: [phone] [phone]     Fax#: [phone] Pt. Approved via fax on 12/9 for admit 44/0-10/27  Pre-Cert#: [id number]      Employer: Benefits:  Phone #:      Name:  Eff Date: 07/18/2022- 03/19/2023   Deductible: does not have one   OOP Max: $3,600 ($1,430.45 met)   CIR: $295/day co-pay with a max co-pay of $2,065/admission (7 days)   SNF: $20/day co-pay for days 1-20, $203/day co-pay for days 21-100, limited to 100 days/cal yr   Outpatient:  $25 copay/visit Home Health:  100% coverage   DME: 80% coverage; 20% co-insurance   Providers: in network   SECONDARY:       Policy#:      Phone#:    Policy #: [id number], Medicare: [id number]  Fax: [phone]  Phone: online at [email or web address] [phone]       Financial Counselor:       Phone#:   The "Data Collection Information Summary" for patients in Inpatient Rehabilitation Facilities with attached "Privacy Act Statement-Health Care Records" was provided and verbally reviewed with: Patient  Emergency Contact Information Contact Information     Name Relation Home Work Mobile   [name],[name] Spouse   [phone]      Other Contacts   None on File     Current Medical History  Patient Admitting Diagnosis: CVA History of Present Illness: An 67 y.o. 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 He  He was walking up the driveway off  the car after shopping when he tripped and fell hitting the back of his head. He lost consciousness and remembered only when EMS personnel were around him for picking up. When EMS arrived he had been incontinent but he was wearing briefs already.  They also reported that he appeared a little bit altered initially at the scene but quickly recovered.  He also has had some reported short-term memory issues from his prior strokes.  He complained of right flank pain, and mid back pain, and HA (back of head) in ED.  He was also found to have a hematoma on the back of his head.Due to BP 185/105, he was started on Cleviprex in the ER. Imaging revealing for  13 x 13 mm hemorrhagic parenchymal contusion in the right temporal lobe , L posterior parietal scalp hematoma and pelvic hematoma.  Neurosurgery was consulted and they did not recommend surgical intervention. Pt. Was seen by PT/OT and they recommend CIR to assist return to PLOF.  Complete NIHSS TOTAL: 2  Patient's medical record from [facility] has been reviewed by the rehabilitation admission coordinator and physician.  Past Medical History  Past Medical History:  Diagnosis Date   Anxiety    Arthritis    Depression  Diabetes mellitus without complication (HCC)    Headache    Hypertension    Hypothyroidism    Sleep apnea    wears CPAP   Stroke [facility])    1 stroke 2/24, and 2 strokes on 03/09/21   Thyroid disease     Has the patient had major surgery during 100 days prior to admission? No  Family History   family history includes Diabetes in his father.  Current Medications  Current Facility-Administered Medications:    acetaminophen (TYLENOL) tablet 650 mg, 650 mg, Oral, Q6H PRN, Patrici Ranks, MD, 650 mg at 02/22/23 0310   busPIRone (BUSPAR) tablet 10 mg, 10 mg, Oral, TID, Albustami, Flonnie Hailstone, MD, 10 mg at 02/25/23 0847   carvedilol (COREG) tablet 12.5 mg, 12.5 mg, Oral, BID WC, Albustami, Flonnie Hailstone, MD, 12.5 mg at  02/25/23 0848   Chlorhexidine Gluconate Cloth 2 % PADS 6 each, 6 each, Topical, Q2200, Albustami, Flonnie Hailstone, MD, 6 each at 02/24/23 2207   clotrimazole (LOTRIMIN) 1 % cream, , Topical, BID, Albustami, Flonnie Hailstone, MD, Given at 02/23/23 2113   docusate sodium (COLACE) capsule 100 mg, 100 mg, Oral, BID, Erick Alley, DO, 100 mg at 02/25/23 0847   DULoxetine (CYMBALTA) DR capsule 30 mg, 30 mg, Oral, BID, Albustami, Flonnie Hailstone, MD, 30 mg at 02/25/23 0848   gabapentin (NEURONTIN) capsule 300 mg, 300 mg, Oral, TID, Albustami, Flonnie Hailstone, MD, 300 mg at 02/25/23 0848   hydrochlorothiazide (HYDRODIURIL) tablet 12.5 mg, 12.5 mg, Oral, BID, Albustami, Flonnie Hailstone, MD, 12.5 mg at 02/25/23 0848   insulin aspart (novoLOG) injection 0-15 Units, 0-15 Units, Subcutaneous, Q4H, Albustami, Flonnie Hailstone, MD, 5 Units at 02/25/23 1200   insulin detemir (LEVEMIR) injection 20 Units, 20 Units, Subcutaneous, QHS, Albustami, Flonnie Hailstone, MD, 20 Units at 02/24/23 2207   levETIRAcetam (KEPPRA) tablet 500 mg, 500 mg, Oral, BID, Leatha Gilding, MD   levothyroxine (SYNTHROID) tablet 125 mcg, 125 mcg, Oral, Q0600, Patrici Ranks, MD, 125 mcg at 02/25/23 4098   morphine (PF) 4 MG/ML injection 4 mg, 4 mg, Intravenous, Q4H PRN, Albustami, Flonnie Hailstone, MD   mupirocin ointment (BACTROBAN) 2 % 1 Application, 1 Application, Nasal, BID, Albustami, Flonnie Hailstone, MD, 1 Application at 02/25/23 0846   Oral care mouth rinse, 15 mL, Mouth Rinse, PRN, Albustami, Flonnie Hailstone, MD   polyethylene glycol (MIRALAX / GLYCOLAX) packet 17 g, 17 g, Oral, Daily PRN, Albustami, Flonnie Hailstone, MD   rosuvastatin (CRESTOR) tablet 40 mg, 40 mg, Oral, Daily, Albustami, Flonnie Hailstone, MD, 40 mg at 02/25/23 0847   tiZANidine (ZANAFLEX) tablet 2 mg, 2 mg, Oral, Q8H PRN, Albustami, Flonnie Hailstone, MD   topiramate (TOPAMAX) tablet 50 mg, 50 mg, Oral, Daily, Albustami, Flonnie Hailstone, MD, 50 mg at 02/25/23 0848   traMADol (ULTRAM) tablet 50 mg, 50 mg, Oral, Q8H PRN, Patrici Ranks, MD, 50 mg at 02/25/23 0848   traZODone (DESYREL)  tablet 100 mg, 100 mg, Oral, QHS, Albustami, Flonnie Hailstone, MD, 100 mg at 02/23/23 2117  Patients Current Diet:  Diet Order             Diet Heart Room service appropriate? Yes; Fluid consistency: Thin  Diet effective now                   Precautions / Restrictions Precautions Precautions: Fall Precaution Comments: hx of CVA Lt hemi Other Brace: has an AFO for Lt foot but has not been wearing much lately, not at hospital Restrictions Weight  Bearing Restrictions: No   Has the patient had 2 or more falls or a fall with injury in the past year? Yes  Prior Activity Level Community (5-7x/wk): Pt active in the community PTA  Prior Functional Level Self Care: Did the patient need help bathing, dressing, using the toilet or eating? Independent  Indoor Mobility: Did the patient need assistance with walking from room to room (with or without device)? Independent  Stairs: Did the patient need assistance with internal or external stairs (with or without device)? Independent  Functional Cognition: Did the patient need help planning regular tasks such as shopping or remembering to take medications? Independent  Patient Information Are you of Hispanic, Latino/a,or Spanish origin?: A. No, not of Hispanic, Latino/a, or Spanish origin What is your race?: A. White Do you need or want an interpreter to communicate with a doctor or health care staff?: 0. No  Patient's Response To:  Health Literacy and Transportation Is the patient able to respond to health literacy and transportation needs?: Yes Health Literacy - How often do you need to have someone help you when you read instructions, pamphlets, or other written material from your doctor or pharmacy?: Sometimes In the past 12 months, has lack of transportation kept you from medical appointments or from getting medications?: No In the past 12 months, has lack of transportation kept you from meetings, work, or from getting things needed for daily  living?: No  Home Assistive Devices / Equipment Home Equipment: Agricultural consultant (2 wheels), Grab bars - tub/shower, Grab bars - toilet, Rollator (4 wheels), Adaptive equipment, BSC/3in1, Shower seat - built in  Prior Device Use: Indicate devices/aids used by the patient prior to current illness, exacerbation or injury? Walker  Current Functional Level Cognition  Overall Cognitive Status: No family/caregiver present to determine baseline cognitive functioning Orientation Level: Oriented X4 General Comments: reports he had tramadol for pain and has made him very sleepy    Extremity Assessment (includes Sensation/Coordination)  Upper Extremity Assessment: LUE deficits/detail, Right hand dominant LUE Deficits / Details: hx of CVA with hemiparesis. reports daily stretching of UE. increased tone in 1st digit but all other joints easily stretched. assists hand onto walker LUE Coordination: decreased fine motor, decreased gross motor  Lower Extremity Assessment: Defer to PT evaluation    ADLs  Overall ADL's : Needs assistance/impaired Eating/Feeding: Set up Grooming: Minimal assistance, Standing, Oral care Grooming Details (indicate cue type and reason): assist for balance, locating items on L side of sink. able to use compensatory methods to open toothpaste and place on toothbrush. difficulty identifying soap on R side (was looking for rinse cup but used soap dispenser accidentally) Upper Body Bathing: Minimal assistance Lower Body Bathing: Moderate assistance, Sitting/lateral leans, Sit to/from stand Upper Body Dressing : Minimal assistance, Sitting Lower Body Dressing: Moderate assistance, Sitting/lateral leans, Sit to/from stand Toilet Transfer: Moderate assistance, Minimal assistance, Ambulation, Rolling walker (2 wheels) Toileting- Clothing Manipulation and Hygiene: Minimal assistance, Sitting/lateral lean, Sit to/from stand Functional mobility during ADLs: Moderate assistance, Minimal  assistance, Rolling walker (2 wheels) General ADL Comments: significant imbalance with in room mobility using RW    Mobility  Overal bed mobility: Needs Assistance Bed Mobility: Supine to Sit Supine to sit: Supervision, HOB elevated, Used rails General bed mobility comments: up in recliner; sitting EOB with RN at end of session    Transfers  Overall transfer level: Needs assistance Equipment used: Rolling walker (2 wheels) Transfers: Sit to/from Stand, Bed to chair/wheelchair/BSC Sit to Stand: Contact guard  assist Bed to/from chair/wheelchair/BSC transfer type:: Step pivot Step pivot transfers: Min assist General transfer comment: unable to stand x 10 attempts but did not want assistance;  I moved to his left side and pt able to come up as he leans excessively over his rt foot to come up (I was in his way with previous attempts--which pt was not able to recognize); +use of RW with stand-pivot from recliner to bed with imbalance noted    Ambulation / Gait / Stairs / Wheelchair Mobility  Ambulation/Gait Ambulation/Gait assistance: Editor, commissioning (Feet): 50 Feet Assistive device: Rolling walker (2 wheels) Gait Pattern/deviations: Step-to pattern, Step-through pattern, Decreased stride length, Decreased dorsiflexion - left, Shuffle, Wide base of support, Staggering left General Gait Details: With pt's shoes (AFO not at hospital); Hemiparetic gait, initially with poor Lt foot clearance and reduced awareness. Able to eventually correct and compensate with larger step length. No overt buckling. Several staggering steps requiring min assist to recover Gait velocity: dec Gait velocity interpretation: <1.31 ft/sec, indicative of household ambulator Pre-gait activities: weight shift and march    Posture / Balance Balance Overall balance assessment: Needs assistance Sitting-balance support: No upper extremity supported, Feet supported Sitting balance-Leahy Scale: Fair Standing balance  support: Bilateral upper extremity supported, No upper extremity supported, During functional activity Standing balance-Leahy Scale: Poor Standing balance comment: more stable with UE support    Special needs/care consideration Special service needs Pt. Needs supervision- mod I goals as his wife is in a wheelchair   Previous Home Environment (from acute therapy documentation) Living Arrangements: Spouse/significant other (wife is w/c bound) Available Help at Discharge: Family, Available 24 hours/day Type of Home: House Home Layout: One level Home Access: Ramped entrance, Stairs to enter [employer] of Steps: ramp into the house then 2 steps with grab bar Bathroom Shower/Tub: Health visitor: Handicapped height Bathroom Accessibility: Yes How Accessible: Accessible via wheelchair, Accessible via walker Additional Comments: wife uses trasport chair for mobility in the home  Discharge Living Setting Plans for Discharge Living Setting: Patient's home Type of Home at Discharge: House Discharge Home Layout: One level Discharge Home Access: Ramped entrance Discharge Bathroom Shower/Tub: Walk-in shower Discharge Bathroom Toilet: Handicapped height Discharge Bathroom Accessibility: Yes How Accessible: Accessible via walker  Social/Family/Support Systems Patient Roles: Spouse Contact Information: [phone] Anticipated Caregiver: Gavin Pound Ability/Limitations of Caregiver: supervision, she is in wheelchair but can help with cooking, preparing meds Caregiver Availability: 24/7 Discharge Plan Discussed with Primary Caregiver: Yes Is Caregiver In Agreement with Plan?: No Does Caregiver/Family have Issues with Lodging/Transportation while Pt is in Rehab?: No  Goals Patient/Family Goal for Rehab: PT/OT mod I Expected length of stay: 7-10 days Pt/Family Agrees to Admission and willing to participate: Yes Program Orientation Provided & Reviewed with Pt/Caregiver  Including Roles  & Responsibilities: Yes  Decrease burden of Care through IP rehab admission: not anticipated  Possible need for SNF placement upon discharge: not anticipated  Patient Condition: I have reviewed medical records from [facility] , spoken with CM, and patient and spouse. I met with patient at the bedside for inpatient rehabilitation assessment.  Patient will benefit from ongoing PT, OT, and SLP, can actively participate in 3 hours of therapy a day 5 days of the week, and can make measurable gains during the admission.  Patient will also benefit from the coordinated team approach during an Inpatient Acute Rehabilitation admission.  The patient will receive intensive therapy as well as Rehabilitation physician, nursing, social worker, and  care management interventions.  Due to safety, skin/wound care, disease management, medication administration, pain management, and patient education the patient requires 24 hour a day rehabilitation nursing.  The patient is currently Min A to CGA with mobility and basic ADLs.  Discharge setting and therapy post discharge at home with home health is anticipated.  Patient has agreed to participate in the Acute Inpatient Rehabilitation Program and will admit today.  Preadmission Screen Completed By:  Jeronimo Greaves, 02/25/2023 1:05 PM ______________________________________________________________________   Discussed status with Dr. Riley Kill on 02/25/23 at 930 and received approval for admission today.  Admission Coordinator:  Jeronimo Greaves, CCC-SLP, time 1307/Date 02/25/23   Assessment/Plan: Diagnosis:  TBI Does the need for close, 24 hr/day Medical supervision in concert with the patient's rehab needs make it unreasonable for this patient to be served in a less intensive setting? Yes Co-Morbidities requiring supervision/potential complications: HTN, DM, OSA , venous insufficiency with chronic LE edema Due to bladder management, bowel  management, safety, skin/wound care, disease management, medication administration, pain management, and patient education, does the patient require 24 hr/day rehab nursing? Yes Does the patient require coordinated care of a physician, rehab nurse, PT, OT, and SLP to address physical and functional deficits in the context of the above medical diagnosis(es)? Yes Addressing deficits in the following areas: balance, endurance, locomotion, strength, transferring, bowel/bladder control, bathing, dressing, feeding, grooming, toileting, cognition, and psychosocial support Can the patient actively participate in an intensive therapy program of at least 3 hrs of therapy 5 days a week? Yes The potential for patient to make measurable gains while on inpatient rehab is good Anticipated functional outcomes upon discharge from inpatient rehab: modified independent and supervision PT, modified independent and supervision OT, modified independent SLP Estimated rehab length of stay to reach the above functional goals is: 7-10d Anticipated discharge destination: Home 10. Overall Rehab/Functional Prognosis: good   MD Signature: Erick Colace M.D. [facility] Fellow Am Acad of [employer] Med and Rehab Diplomate Am Board of Electrodiagnostic Med Fellow Am Board of Interventional Pain

## 2023-02-25 ENCOUNTER — Inpatient Hospital Stay (HOSPITAL_COMMUNITY)
Admission: AD | Admit: 2023-02-25 | Discharge: 2023-03-11 | DRG: 945 | Disposition: A | Discharge status: Home / Self Care | Payer: Medicare HMO | Source: Intra-hospital | Attending: Physical Medicine & Rehabilitation | Admitting: Physical Medicine & Rehabilitation

## 2023-02-25 ENCOUNTER — Other Ambulatory Visit: Payer: Self-pay

## 2023-02-25 ENCOUNTER — Encounter (HOSPITAL_COMMUNITY): Payer: Self-pay | Admitting: Physical Medicine & Rehabilitation

## 2023-02-25 ENCOUNTER — Inpatient Hospital Stay (HOSPITAL_COMMUNITY): Payer: Medicare HMO

## 2023-02-25 DIAGNOSIS — K829 Disease of gallbladder, unspecified: Secondary | ICD-10-CM | POA: Diagnosis present

## 2023-02-25 DIAGNOSIS — E1149 Type 2 diabetes mellitus with other diabetic neurological complication: Secondary | ICD-10-CM | POA: Diagnosis not present

## 2023-02-25 DIAGNOSIS — G9389 Other specified disorders of brain: Secondary | ICD-10-CM | POA: Diagnosis present

## 2023-02-25 DIAGNOSIS — M549 Dorsalgia, unspecified: Secondary | ICD-10-CM | POA: Diagnosis present

## 2023-02-25 DIAGNOSIS — F32A Depression, unspecified: Secondary | ICD-10-CM | POA: Diagnosis present

## 2023-02-25 DIAGNOSIS — E039 Hypothyroidism, unspecified: Secondary | ICD-10-CM | POA: Diagnosis present

## 2023-02-25 DIAGNOSIS — I639 Cerebral infarction, unspecified: Principal | ICD-10-CM | POA: Diagnosis present

## 2023-02-25 DIAGNOSIS — Z833 Family history of diabetes mellitus: Secondary | ICD-10-CM

## 2023-02-25 DIAGNOSIS — F320 Major depressive disorder, single episode, mild: Secondary | ICD-10-CM

## 2023-02-25 DIAGNOSIS — I83029 Varicose veins of left lower extremity with ulcer of unspecified site: Secondary | ICD-10-CM | POA: Diagnosis present

## 2023-02-25 DIAGNOSIS — I69398 Other sequelae of cerebral infarction: Secondary | ICD-10-CM

## 2023-02-25 DIAGNOSIS — R41841 Cognitive communication deficit: Secondary | ICD-10-CM | POA: Diagnosis present

## 2023-02-25 DIAGNOSIS — Z79890 Hormone replacement therapy (postmenopausal): Secondary | ICD-10-CM

## 2023-02-25 DIAGNOSIS — G8929 Other chronic pain: Secondary | ICD-10-CM | POA: Diagnosis present

## 2023-02-25 DIAGNOSIS — K5900 Constipation, unspecified: Secondary | ICD-10-CM | POA: Diagnosis present

## 2023-02-25 DIAGNOSIS — S069XAA Unspecified intracranial injury with loss of consciousness status unknown, initial encounter: Secondary | ICD-10-CM | POA: Insufficient documentation

## 2023-02-25 DIAGNOSIS — F419 Anxiety disorder, unspecified: Secondary | ICD-10-CM | POA: Diagnosis present

## 2023-02-25 DIAGNOSIS — S069X9S Unspecified intracranial injury with loss of consciousness of unspecified duration, sequela: Secondary | ICD-10-CM | POA: Diagnosis not present

## 2023-02-25 DIAGNOSIS — I872 Venous insufficiency (chronic) (peripheral): Secondary | ICD-10-CM | POA: Diagnosis present

## 2023-02-25 DIAGNOSIS — T383X6A Underdosing of insulin and oral hypoglycemic [antidiabetic] drugs, initial encounter: Secondary | ICD-10-CM | POA: Diagnosis present

## 2023-02-25 DIAGNOSIS — Z7409 Other reduced mobility: Secondary | ICD-10-CM | POA: Diagnosis present

## 2023-02-25 DIAGNOSIS — M25562 Pain in left knee: Secondary | ICD-10-CM | POA: Diagnosis present

## 2023-02-25 DIAGNOSIS — K5901 Slow transit constipation: Secondary | ICD-10-CM | POA: Diagnosis not present

## 2023-02-25 DIAGNOSIS — S069X9A Unspecified intracranial injury with loss of consciousness of unspecified duration, initial encounter: Secondary | ICD-10-CM | POA: Diagnosis not present

## 2023-02-25 DIAGNOSIS — I10 Essential (primary) hypertension: Secondary | ICD-10-CM | POA: Diagnosis present

## 2023-02-25 DIAGNOSIS — R159 Full incontinence of feces: Secondary | ICD-10-CM | POA: Diagnosis not present

## 2023-02-25 DIAGNOSIS — Z7982 Long term (current) use of aspirin: Secondary | ICD-10-CM

## 2023-02-25 DIAGNOSIS — Z79899 Other long term (current) drug therapy: Secondary | ICD-10-CM

## 2023-02-25 DIAGNOSIS — Z794 Long term (current) use of insulin: Secondary | ICD-10-CM | POA: Diagnosis not present

## 2023-02-25 DIAGNOSIS — Z91138 Patient's unintentional underdosing of medication regimen for other reason: Secondary | ICD-10-CM

## 2023-02-25 DIAGNOSIS — Z87898 Personal history of other specified conditions: Secondary | ICD-10-CM

## 2023-02-25 DIAGNOSIS — E1165 Type 2 diabetes mellitus with hyperglycemia: Secondary | ICD-10-CM | POA: Diagnosis present

## 2023-02-25 DIAGNOSIS — Z981 Arthrodesis status: Secondary | ICD-10-CM

## 2023-02-25 DIAGNOSIS — B353 Tinea pedis: Secondary | ICD-10-CM | POA: Insufficient documentation

## 2023-02-25 DIAGNOSIS — L89326 Pressure-induced deep tissue damage of left buttock: Secondary | ICD-10-CM | POA: Diagnosis not present

## 2023-02-25 DIAGNOSIS — I69354 Hemiplegia and hemiparesis following cerebral infarction affecting left non-dominant side: Secondary | ICD-10-CM | POA: Diagnosis not present

## 2023-02-25 DIAGNOSIS — W010XXD Fall on same level from slipping, tripping and stumbling without subsequent striking against object, subsequent encounter: Secondary | ICD-10-CM | POA: Diagnosis present

## 2023-02-25 DIAGNOSIS — S300XXD Contusion of lower back and pelvis, subsequent encounter: Secondary | ICD-10-CM

## 2023-02-25 DIAGNOSIS — D62 Acute posthemorrhagic anemia: Secondary | ICD-10-CM | POA: Diagnosis present

## 2023-02-25 DIAGNOSIS — I169 Hypertensive crisis, unspecified: Secondary | ICD-10-CM | POA: Diagnosis not present

## 2023-02-25 DIAGNOSIS — F4323 Adjustment disorder with mixed anxiety and depressed mood: Secondary | ICD-10-CM | POA: Diagnosis not present

## 2023-02-25 DIAGNOSIS — S069X1D Unspecified intracranial injury with loss of consciousness of 30 minutes or less, subsequent encounter: Principal | ICD-10-CM

## 2023-02-25 DIAGNOSIS — K828 Other specified diseases of gallbladder: Secondary | ICD-10-CM | POA: Diagnosis present

## 2023-02-25 DIAGNOSIS — S062XAD Diffuse traumatic brain injury with loss of consciousness status unknown, subsequent encounter: Secondary | ICD-10-CM | POA: Diagnosis present

## 2023-02-25 DIAGNOSIS — S069X1A Unspecified intracranial injury with loss of consciousness of 30 minutes or less, initial encounter: Secondary | ICD-10-CM | POA: Diagnosis not present

## 2023-02-25 DIAGNOSIS — M961 Postlaminectomy syndrome, not elsewhere classified: Secondary | ICD-10-CM | POA: Diagnosis not present

## 2023-02-25 DIAGNOSIS — Z7984 Long term (current) use of oral hypoglycemic drugs: Secondary | ICD-10-CM

## 2023-02-25 DIAGNOSIS — R413 Other amnesia: Secondary | ICD-10-CM | POA: Diagnosis present

## 2023-02-25 DIAGNOSIS — Z7985 Long-term (current) use of injectable non-insulin antidiabetic drugs: Secondary | ICD-10-CM

## 2023-02-25 DIAGNOSIS — E119 Type 2 diabetes mellitus without complications: Secondary | ICD-10-CM

## 2023-02-25 DIAGNOSIS — S069X1S Unspecified intracranial injury with loss of consciousness of 30 minutes or less, sequela: Secondary | ICD-10-CM | POA: Diagnosis not present

## 2023-02-25 DIAGNOSIS — E876 Hypokalemia: Secondary | ICD-10-CM | POA: Diagnosis not present

## 2023-02-25 DIAGNOSIS — S06349D Traumatic hemorrhage of right cerebrum with loss of consciousness of unspecified duration, subsequent encounter: Secondary | ICD-10-CM

## 2023-02-25 DIAGNOSIS — G4733 Obstructive sleep apnea (adult) (pediatric): Secondary | ICD-10-CM | POA: Diagnosis present

## 2023-02-25 DIAGNOSIS — E663 Overweight: Secondary | ICD-10-CM | POA: Diagnosis present

## 2023-02-25 DIAGNOSIS — I890 Lymphedema, not elsewhere classified: Secondary | ICD-10-CM | POA: Diagnosis present

## 2023-02-25 DIAGNOSIS — Z6829 Body mass index (BMI) 29.0-29.9, adult: Secondary | ICD-10-CM

## 2023-02-25 LAB — GLUCOSE, CAPILLARY
Glucose-Capillary: 185 mg/dL — ABNORMAL HIGH (ref 70–99)
Glucose-Capillary: 159 mg/dL — ABNORMAL HIGH (ref 70–99)
Glucose-Capillary: 231 mg/dL — ABNORMAL HIGH (ref 70–99)
Glucose-Capillary: 267 mg/dL — ABNORMAL HIGH (ref 70–99)
Glucose-Capillary: 183 mg/dL — ABNORMAL HIGH (ref 70–99)

## 2023-02-25 MED ORDER — TRAMADOL HCL 50 MG PO TABS
50.0000 mg | ORAL_TABLET | Freq: Three times a day (TID) | ORAL | Status: DC | PRN
  Administered 2023-02-25 – 2023-02-26 (×2): 50 mg via ORAL
  Filled 2023-02-25 (×2): qty 1

## 2023-02-25 MED ORDER — INSULIN ASPART 100 UNIT/ML IJ SOLN
0.0000 [IU] | Freq: Every day | INTRAMUSCULAR | Status: DC
  Administered 2023-02-26 – 2023-03-01 (×4): 3 [IU] via SUBCUTANEOUS
  Administered 2023-03-02 – 2023-03-07 (×3): 2 [IU] via SUBCUTANEOUS

## 2023-02-25 MED ORDER — MELATONIN 5 MG PO TABS
5.0000 mg | ORAL_TABLET | Freq: Every evening | ORAL | Status: DC | PRN
  Filled 2023-02-25: qty 1

## 2023-02-25 MED ORDER — TRAZODONE HCL 50 MG PO TABS
100.0000 mg | ORAL_TABLET | Freq: Every day | ORAL | Status: DC
  Administered 2023-02-26 – 2023-03-10 (×13): 100 mg via ORAL
  Filled 2023-02-25 (×13): qty 2

## 2023-02-25 MED ORDER — CHLORHEXIDINE GLUCONATE CLOTH 2 % EX PADS
6.0000 | MEDICATED_PAD | Freq: Every day | CUTANEOUS | Status: AC
  Administered 2023-02-25: 6 via TOPICAL

## 2023-02-25 MED ORDER — ROSUVASTATIN CALCIUM 20 MG PO TABS
40.0000 mg | ORAL_TABLET | Freq: Every day | ORAL | Status: DC
  Administered 2023-02-26 – 2023-03-11 (×14): 40 mg via ORAL
  Filled 2023-02-25 (×14): qty 2

## 2023-02-25 MED ORDER — GABAPENTIN 300 MG PO CAPS
300.0000 mg | ORAL_CAPSULE | Freq: Three times a day (TID) | ORAL | Status: DC
  Administered 2023-02-25 – 2023-03-11 (×42): 300 mg via ORAL
  Filled 2023-02-25 (×42): qty 1

## 2023-02-25 MED ORDER — PROCHLORPERAZINE MALEATE 5 MG PO TABS: 5.0000 mg | ORAL_TABLET | Freq: Four times a day (QID) | ORAL | Status: DC | PRN

## 2023-02-25 MED ORDER — POLYETHYLENE GLYCOL 3350 17 G PO PACK
34.0000 g | PACK | Freq: Every day | ORAL | Status: DC
  Administered 2023-02-26 – 2023-03-01 (×4): 34 g via ORAL
  Filled 2023-02-25 (×4): qty 2

## 2023-02-25 MED ORDER — ENOXAPARIN SODIUM 40 MG/0.4ML IJ SOSY
40.0000 mg | PREFILLED_SYRINGE | INTRAMUSCULAR | Status: DC
  Administered 2023-02-25 – 2023-03-10 (×14): 40 mg via SUBCUTANEOUS
  Filled 2023-02-25 (×14): qty 0.4

## 2023-02-25 MED ORDER — POLYETHYLENE GLYCOL 3350 17 G PO PACK: 17.0000 g | PACK | Freq: Every day | ORAL | Status: DC

## 2023-02-25 MED ORDER — CLOTRIMAZOLE 1 % EX CREA
TOPICAL_CREAM | Freq: Two times a day (BID) | CUTANEOUS | Status: DC
  Filled 2023-02-25 (×2): qty 15

## 2023-02-25 MED ORDER — INSULIN ASPART 100 UNIT/ML IJ SOLN
0.0000 [IU] | Freq: Three times a day (TID) | INTRAMUSCULAR | Status: DC
  Administered 2023-02-25: 5 [IU] via SUBCUTANEOUS
  Administered 2023-02-26 (×2): 3 [IU] via SUBCUTANEOUS
  Administered 2023-02-26 – 2023-02-27 (×3): 5 [IU] via SUBCUTANEOUS
  Administered 2023-02-27 – 2023-02-28 (×2): 3 [IU] via SUBCUTANEOUS
  Administered 2023-02-28: 2 [IU] via SUBCUTANEOUS
  Administered 2023-02-28 – 2023-03-01 (×2): 3 [IU] via SUBCUTANEOUS
  Administered 2023-03-01: 5 [IU] via SUBCUTANEOUS
  Administered 2023-03-01: 2 [IU] via SUBCUTANEOUS
  Administered 2023-03-02: 3 [IU] via SUBCUTANEOUS
  Administered 2023-03-02: 2 [IU] via SUBCUTANEOUS
  Administered 2023-03-02: 3 [IU] via SUBCUTANEOUS
  Administered 2023-03-03 (×3): 2 [IU] via SUBCUTANEOUS
  Administered 2023-03-04 (×2): 1 [IU] via SUBCUTANEOUS
  Administered 2023-03-04 – 2023-03-06 (×3): 2 [IU] via SUBCUTANEOUS
  Administered 2023-03-06: 1 [IU] via SUBCUTANEOUS
  Administered 2023-03-07: 2 [IU] via SUBCUTANEOUS
  Administered 2023-03-07 – 2023-03-09 (×3): 1 [IU] via SUBCUTANEOUS
  Administered 2023-03-09 – 2023-03-10 (×2): 2 [IU] via SUBCUTANEOUS
  Administered 2023-03-11: 1 [IU] via SUBCUTANEOUS

## 2023-02-25 MED ORDER — VITAMIN C 500 MG PO TABS
500.0000 mg | ORAL_TABLET | Freq: Every day | ORAL | Status: DC
  Administered 2023-02-25 – 2023-03-10 (×14): 500 mg via ORAL
  Filled 2023-02-25 (×14): qty 1

## 2023-02-25 MED ORDER — DOCUSATE SODIUM 100 MG PO CAPS
100.0000 mg | ORAL_CAPSULE | Freq: Two times a day (BID) | ORAL | Status: DC
  Administered 2023-02-25 – 2023-03-08 (×16): 100 mg via ORAL
  Filled 2023-02-25 (×28): qty 1

## 2023-02-25 MED ORDER — MUPIROCIN 2 % EX OINT
1.0000 | TOPICAL_OINTMENT | Freq: Two times a day (BID) | CUTANEOUS | Status: AC
Start: 2023-02-25 — End: 2023-02-26
  Administered 2023-02-25 – 2023-02-26 (×2): 1 via NASAL
  Filled 2023-02-25: qty 22

## 2023-02-25 MED ORDER — LEVETIRACETAM 500 MG PO TABS: 500.0000 mg | ORAL_TABLET | Freq: Two times a day (BID) | ORAL | Status: DC

## 2023-02-25 MED ORDER — LEVOTHYROXINE SODIUM 25 MCG PO TABS
125.0000 ug | ORAL_TABLET | Freq: Every day | ORAL | Status: DC
  Administered 2023-02-26 – 2023-03-11 (×14): 125 ug via ORAL
  Filled 2023-02-25 (×14): qty 1

## 2023-02-25 MED ORDER — DULOXETINE HCL 30 MG PO CPEP
30.0000 mg | ORAL_CAPSULE | Freq: Two times a day (BID) | ORAL | Status: DC
  Administered 2023-02-25 – 2023-03-11 (×28): 30 mg via ORAL
  Filled 2023-02-25 (×28): qty 1

## 2023-02-25 MED ORDER — FLEET ENEMA RE ENEM: 1.0000 | ENEMA | Freq: Once | RECTAL | Status: DC | PRN

## 2023-02-25 MED ORDER — ALUM & MAG HYDROXIDE-SIMETH 200-200-20 MG/5ML PO SUSP: 30.0000 mL | ORAL | Status: DC | PRN

## 2023-02-25 MED ORDER — TOPIRAMATE 25 MG PO TABS
50.0000 mg | ORAL_TABLET | Freq: Every day | ORAL | Status: DC
  Administered 2023-02-26 – 2023-03-11 (×14): 50 mg via ORAL
  Filled 2023-02-25 (×14): qty 2

## 2023-02-25 MED ORDER — PROCHLORPERAZINE EDISYLATE 10 MG/2ML IJ SOLN: 5.0000 mg | Freq: Four times a day (QID) | INTRAMUSCULAR | Status: DC | PRN

## 2023-02-25 MED ORDER — GUAIFENESIN-DM 100-10 MG/5ML PO SYRP: 5.0000 mL | ORAL_SOLUTION | Freq: Four times a day (QID) | ORAL | Status: DC | PRN

## 2023-02-25 MED ORDER — BUSPIRONE HCL 10 MG PO TABS
10.0000 mg | ORAL_TABLET | Freq: Three times a day (TID) | ORAL | Status: DC
  Administered 2023-02-25 – 2023-03-11 (×42): 10 mg via ORAL
  Filled 2023-02-25 (×42): qty 1

## 2023-02-25 MED ORDER — ACETAMINOPHEN 325 MG PO TABS
325.0000 mg | ORAL_TABLET | ORAL | Status: DC | PRN
  Administered 2023-02-26 – 2023-03-01 (×3): 325 mg via ORAL
  Administered 2023-03-02 – 2023-03-09 (×6): 650 mg via ORAL
  Filled 2023-02-25 (×9): qty 2

## 2023-02-25 MED ORDER — ORAL CARE MOUTH RINSE: 15.0000 mL | OROMUCOSAL | Status: DC | PRN

## 2023-02-25 MED ORDER — LEVETIRACETAM 500 MG PO TABS
500.0000 mg | ORAL_TABLET | Freq: Two times a day (BID) | ORAL | Status: AC
  Administered 2023-02-25 – 2023-02-28 (×7): 500 mg via ORAL
  Filled 2023-02-25 (×7): qty 1

## 2023-02-25 MED ORDER — ZINC SULFATE 220 (50 ZN) MG PO CAPS
220.0000 mg | ORAL_CAPSULE | Freq: Every day | ORAL | Status: DC
  Administered 2023-02-25 – 2023-03-10 (×14): 220 mg via ORAL
  Filled 2023-02-25 (×14): qty 1

## 2023-02-25 MED ORDER — INSULIN DETEMIR 100 UNIT/ML ~~LOC~~ SOLN
4.0000 [IU] | Freq: Every day | SUBCUTANEOUS | Status: DC
  Administered 2023-02-25: 4 [IU] via SUBCUTANEOUS
  Filled 2023-02-25 (×2): qty 0.04

## 2023-02-25 MED ORDER — DICLOFENAC SODIUM 1 % EX GEL
2.0000 g | Freq: Four times a day (QID) | CUTANEOUS | Status: DC
  Administered 2023-02-25 – 2023-03-10 (×48): 2 g via TOPICAL
  Filled 2023-02-25 (×2): qty 100

## 2023-02-25 MED ORDER — CARVEDILOL 12.5 MG PO TABS
12.5000 mg | ORAL_TABLET | Freq: Two times a day (BID) | ORAL | Status: DC
  Administered 2023-02-25 – 2023-03-11 (×25): 12.5 mg via ORAL
  Filled 2023-02-25 (×30): qty 1

## 2023-02-25 MED ORDER — TIZANIDINE HCL 4 MG PO TABS: 2.0000 mg | ORAL_TABLET | Freq: Three times a day (TID) | ORAL | Status: DC | PRN

## 2023-02-25 MED ORDER — BISACODYL 10 MG RE SUPP: 10.0000 mg | Freq: Every day | RECTAL | Status: DC | PRN

## 2023-02-25 MED ORDER — LEVETIRACETAM 500 MG PO TABS
500.0000 mg | ORAL_TABLET | Freq: Two times a day (BID) | ORAL | Status: DC
  Administered 2023-02-25: 500 mg via ORAL
  Filled 2023-02-25: qty 1

## 2023-02-25 MED ORDER — HYDROCHLOROTHIAZIDE 12.5 MG PO TABS
12.5000 mg | ORAL_TABLET | Freq: Two times a day (BID) | ORAL | Status: DC
  Administered 2023-02-25 – 2023-03-11 (×28): 12.5 mg via ORAL
  Filled 2023-02-25 (×28): qty 1

## 2023-02-25 MED ORDER — DIPHENHYDRAMINE HCL 25 MG PO CAPS: 25.0000 mg | ORAL_CAPSULE | Freq: Four times a day (QID) | ORAL | Status: DC | PRN

## 2023-02-25 MED ORDER — PROCHLORPERAZINE 25 MG RE SUPP: 12.5000 mg | Freq: Four times a day (QID) | RECTAL | Status: DC | PRN
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
## 2023-02-25 NOTE — Progress Notes (Signed)
Inpatient Rehab Admissions Coordinator:    I have a CIR bed for this pt. Today. RN may call report to [phone].   Pt. Will admit to CIR for an estimated 7-10 days with the goal of reaching mod I and returning home with assistance from his wife (wife confirmed she can provide supervision at home, as she is cognitively intact but uses a wheelchair herself).   Megan Salon, MS, CCC-SLP Rehab Admissions Coordinator  [phone] (celll) [phone] (office)
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
## 2023-02-25 NOTE — TOC Transition Note (Signed)
Transition of Care [facility]) - CM/SW Discharge Note   Patient Details  Name: Joseph Ortega MRN: [id number] Date of Birth: April 07, 1955  [facility] CM/SW Contact:  Kermit Balo, RN Phone Number: 02/25/2023, 12:59 PM   Clinical Narrative:     Pt is discharging to CIR today. CM signing off.  Final next level of care: IP Rehab Facility Barriers to Discharge: No Barriers Identified   Patient Goals and CMS Choice CMS Medicare.gov Compare Post Acute Care list provided to:: Patient    Discharge Placement                         Discharge Plan and Services Additional resources added to the After Visit Summary for                                       Social Determinants of Health (SDOH) Interventions SDOH Screenings   Food Insecurity: No Food Insecurity (02/22/2023)  Housing: Low Risk  (02/22/2023)  Transportation Needs: No Transportation Needs (02/22/2023)  Utilities: Not At Risk (02/22/2023)  Depression (PHQ2-9): Low Risk  (10/10/2022)  Social Connections: Unknown (08/01/2021)   Received from [facility]  Tobacco Use: Low Risk  (01/29/2023)     Readmission Risk Interventions     No data to display

## 2023-02-25 NOTE — Progress Notes (Addendum)
PMR Admission Coordinator Pre-Admission Assessment   Patient: Joseph Ortega is an 67 y.o., male MRN: [id number] DOB: Nov 09, 1955 Height:   Weight: 107.2 kg   Insurance Information HMO: yes    PPO:      PCP:      IPA:      80/20:      OTHER:  PRIMARY: [employer]      Policy#: [id number]      Subscriber: Pt.  CM Name:   Danella Penton    Phone#: [phone] [id number]     Fax#: [phone] Pt. Approved via fax on 12/9 for admit 44/0-10/27  Pre-Cert#: [id number]      Employer: Benefits:  Phone #:      Name:  Eff Date: 07/18/2022- 03/19/2023   Deductible: does not have one   OOP Max: $3,600 ($1,430.45 met)   CIR: $295/day co-pay with a max co-pay of $2,065/admission (7 days)   SNF: $20/day co-pay for days 1-20, $203/day co-pay for days 21-100, limited to 100 days/cal yr   Outpatient:  $25 copay/visit Home Health:  100% coverage   DME: 80% coverage; 20% co-insurance   Providers: in network   SECONDARY:       Policy#:      Phone#:     Artist:       Phone#:    The Data processing manager" for patients in Inpatient Rehabilitation Facilities with attached "Privacy Act Statement-Health Care Records" was provided and verbally reviewed with: Patient   Emergency Contact Information Contact Information       Name Relation Home Work Mobile    Agustin,[name] Spouse     [phone]         Other Contacts   None on File        Current Medical History  Patient Admitting Diagnosis: TBI History of Present Illness: An 67 y.o. 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 He  He was walking up the driveway off the car after shopping when he tripped and fell hitting the back of his head. He lost consciousness and  remembered only when EMS personnel were around him for picking up. When EMS arrived he had been incontinent but he was wearing briefs already.  They also reported that he appeared a little bit altered initially at the scene but quickly recovered.  He also has had some reported short-term memory issues from his prior strokes.  He complained of right flank pain, and mid back pain, and HA (back of head) in ED.  He was also found to have a hematoma on the back of his head.Due to BP 185/105, he was started on Cleviprex in the ER. Imaging revealing for  13 x 13 mm hemorrhagic parenchymal contusion in the right temporal lobe , L posterior parietal scalp hematoma and pelvic hematoma.  Neurosurgery was consulted and they did not recommend surgical intervention. Pt. Was seen by PT/OT and they recommend CIR to assist return to PLOF.  Complete NIHSS TOTAL: 2   Patient's medical record from [facility] has been reviewed by the rehabilitation admission coordinator and physician.   Past Medical History      Past Medical History:  Diagnosis Date   Anxiety     Arthritis  Depression     Diabetes mellitus without complication (HCC)     Headache     Hypertension     Hypothyroidism     Sleep apnea      wears CPAP   Stroke [facility])      1 stroke 2/24, and 2 strokes on 03/09/21   Thyroid disease            Has the patient had major surgery during 100 days prior to admission? No   Family History   family history includes Diabetes in his father.   Current Medications  Current Medications    Current Facility-Administered Medications:    acetaminophen (TYLENOL) tablet 650 mg, 650 mg, Oral, Q6H PRN, Patrici Ranks, MD, 650 mg at 02/22/23 0310   busPIRone (BUSPAR) tablet 10 mg, 10 mg, Oral, TID, Albustami, Flonnie Hailstone, MD, 10 mg at 02/25/23 0847   carvedilol (COREG) tablet 12.5 mg, 12.5 mg, Oral, BID WC, Albustami, Flonnie Hailstone, MD, 12.5 mg at 02/25/23 0848   Chlorhexidine Gluconate Cloth 2 % PADS  6 each, 6 each, Topical, Q2200, Albustami, Flonnie Hailstone, MD, 6 each at 02/24/23 2207   clotrimazole (LOTRIMIN) 1 % cream, , Topical, BID, Albustami, Flonnie Hailstone, MD, Given at 02/23/23 2113   docusate sodium (COLACE) capsule 100 mg, 100 mg, Oral, BID, Erick Alley, DO, 100 mg at 02/25/23 0847   DULoxetine (CYMBALTA) DR capsule 30 mg, 30 mg, Oral, BID, Albustami, Flonnie Hailstone, MD, 30 mg at 02/25/23 0848   gabapentin (NEURONTIN) capsule 300 mg, 300 mg, Oral, TID, Albustami, Flonnie Hailstone, MD, 300 mg at 02/25/23 0848   hydrochlorothiazide (HYDRODIURIL) tablet 12.5 mg, 12.5 mg, Oral, BID, Albustami, Flonnie Hailstone, MD, 12.5 mg at 02/25/23 0848   insulin aspart (novoLOG) injection 0-15 Units, 0-15 Units, Subcutaneous, Q4H, Albustami, Flonnie Hailstone, MD, 5 Units at 02/25/23 1200   insulin detemir (LEVEMIR) injection 20 Units, 20 Units, Subcutaneous, QHS, Albustami, Flonnie Hailstone, MD, 20 Units at 02/24/23 2207   levETIRAcetam (KEPPRA) tablet 500 mg, 500 mg, Oral, BID, Gherghe, Costin M, MD   levothyroxine (SYNTHROID) tablet 125 mcg, 125 mcg, Oral, Q0600, Patrici Ranks, MD, 125 mcg at 02/25/23 7846   morphine (PF) 4 MG/ML injection 4 mg, 4 mg, Intravenous, Q4H PRN, Albustami, Flonnie Hailstone, MD   mupirocin ointment (BACTROBAN) 2 % 1 Application, 1 Application, Nasal, BID, Albustami, Flonnie Hailstone, MD, 1 Application at 02/25/23 0846   Oral care mouth rinse, 15 mL, Mouth Rinse, PRN, Albustami, Flonnie Hailstone, MD   polyethylene glycol (MIRALAX / GLYCOLAX) packet 17 g, 17 g, Oral, Daily PRN, Albustami, Flonnie Hailstone, MD   rosuvastatin (CRESTOR) tablet 40 mg, 40 mg, Oral, Daily, Albustami, Flonnie Hailstone, MD, 40 mg at 02/25/23 0847   tiZANidine (ZANAFLEX) tablet 2 mg, 2 mg, Oral, Q8H PRN, Albustami, Flonnie Hailstone, MD   topiramate (TOPAMAX) tablet 50 mg, 50 mg, Oral, Daily, Albustami, Flonnie Hailstone, MD, 50 mg at 02/25/23 0848   traMADol (ULTRAM) tablet 50 mg, 50 mg, Oral, Q8H PRN, Patrici Ranks, MD, 50 mg at 02/25/23 0848   traZODone (DESYREL) tablet 100 mg, 100 mg, Oral, QHS, Albustami, Flonnie Hailstone, MD,  100 mg at 02/23/23 2117     Patients Current Diet:  Diet Order                  Diet Heart Room service appropriate? Yes; Fluid consistency: Thin  Diet effective now  Precautions / Restrictions Precautions Precautions: Fall Precaution Comments: hx of CVA Lt hemi Other Brace: has an AFO for Lt foot but has not been wearing much lately, not at hospital Restrictions Weight Bearing Restrictions: No    Has the patient had 2 or more falls or a fall with injury in the past year? Yes   Prior Activity Level Community (5-7x/wk): Pt active in the community PTA   Prior Functional Level Self Care: Did the patient need help bathing, dressing, using the toilet or eating? Independent   Indoor Mobility: Did the patient need assistance with walking from room to room (with or without device)? Independent   Stairs: Did the patient need assistance with internal or external stairs (with or without device)? Independent   Functional Cognition: Did the patient need help planning regular tasks such as shopping or remembering to take medications? Independent   Patient Information Are you of Hispanic, Latino/a,or Spanish origin?: A. No, not of Hispanic, Latino/a, or Spanish origin What is your race?: A. White Do you need or want an interpreter to communicate with a doctor or health care staff?: 0. No   Patient's Response To:  Health Literacy and Transportation Is the patient able to respond to health literacy and transportation needs?: Yes Health Literacy - How often do you need to have someone help you when you read instructions, pamphlets, or other written material from your doctor or pharmacy?: Sometimes In the past 12 months, has lack of transportation kept you from medical appointments or from getting medications?: No In the past 12 months, has lack of transportation kept you from meetings, work, or from getting things needed for daily living?: No   Home Assistive  Devices / Equipment Home Equipment: Agricultural consultant (2 wheels), Grab bars - tub/shower, Grab bars - toilet, Rollator (4 wheels), Adaptive equipment, BSC/3in1, Shower seat - built in   Prior Device Use: Indicate devices/aids used by the patient prior to current illness, exacerbation or injury? Walker   Current Functional Level Cognition   Overall Cognitive Status: No family/caregiver present to determine baseline cognitive functioning Orientation Level: Oriented X4 General Comments: reports he had tramadol for pain and has made him very sleepy    Extremity Assessment (includes Sensation/Coordination)   Upper Extremity Assessment: LUE deficits/detail, Right hand dominant LUE Deficits / Details: hx of CVA with hemiparesis. reports daily stretching of UE. increased tone in 1st digit but all other joints easily stretched. assists hand onto walker LUE Coordination: decreased fine motor, decreased gross motor  Lower Extremity Assessment: Defer to PT evaluation     ADLs   Overall ADL's : Needs assistance/impaired Eating/Feeding: Set up Grooming: Minimal assistance, Standing, Oral care Grooming Details (indicate cue type and reason): assist for balance, locating items on L side of sink. able to use compensatory methods to open toothpaste and place on toothbrush. difficulty identifying soap on R side (was looking for rinse cup but used soap dispenser accidentally) Upper Body Bathing: Minimal assistance Lower Body Bathing: Moderate assistance, Sitting/lateral leans, Sit to/from stand Upper Body Dressing : Minimal assistance, Sitting Lower Body Dressing: Moderate assistance, Sitting/lateral leans, Sit to/from stand Toilet Transfer: Moderate assistance, Minimal assistance, Ambulation, Rolling walker (2 wheels) Toileting- Clothing Manipulation and Hygiene: Minimal assistance, Sitting/lateral lean, Sit to/from stand Functional mobility during ADLs: Moderate assistance, Minimal assistance, Rolling walker  (2 wheels) General ADL Comments: significant imbalance with in room mobility using RW     Mobility   Overal bed mobility: Needs Assistance Bed Mobility: Supine to Sit Supine  to sit: Supervision, HOB elevated, Used rails General bed mobility comments: up in recliner; sitting EOB with RN at end of session     Transfers   Overall transfer level: Needs assistance Equipment used: Rolling walker (2 wheels) Transfers: Sit to/from Stand, Bed to chair/wheelchair/BSC Sit to Stand: Contact guard assist Bed to/from chair/wheelchair/BSC transfer type:: Step pivot Step pivot transfers: Min assist General transfer comment: unable to stand x 10 attempts but did not want assistance;  I moved to his left side and pt able to come up as he leans excessively over his rt foot to come up (I was in his way with previous attempts--which pt was not able to recognize); +use of RW with stand-pivot from recliner to bed with imbalance noted     Ambulation / Gait / Stairs / Wheelchair Mobility   Ambulation/Gait Ambulation/Gait assistance: Editor, commissioning (Feet): 50 Feet Assistive device: Rolling walker (2 wheels) Gait Pattern/deviations: Step-to pattern, Step-through pattern, Decreased stride length, Decreased dorsiflexion - left, Shuffle, Wide base of support, Staggering left General Gait Details: With pt's shoes (AFO not at hospital); Hemiparetic gait, initially with poor Lt foot clearance and reduced awareness. Able to eventually correct and compensate with larger step length. No overt buckling. Several staggering steps requiring min assist to recover Gait velocity: dec Gait velocity interpretation: <1.31 ft/sec, indicative of household ambulator Pre-gait activities: weight shift and march     Posture / Balance Balance Overall balance assessment: Needs assistance Sitting-balance support: No upper extremity supported, Feet supported Sitting balance-Leahy Scale: Fair Standing balance support: Bilateral  upper extremity supported, No upper extremity supported, During functional activity Standing balance-Leahy Scale: Poor Standing balance comment: more stable with UE support     Special needs/care consideration Special service needs Pt. Needs supervision- mod I goals as his wife is in a wheelchair    Previous Home Environment (from acute therapy documentation) Living Arrangements: Spouse/significant other (wife is w/c bound) Available Help at Discharge: Family, Available 24 hours/day Type of Home: House Home Layout: One level Home Access: Ramped entrance, Stairs to enter [employer] of Steps: ramp into the house then 2 steps with grab bar Bathroom Shower/Tub: Health visitor: Handicapped height Bathroom Accessibility: Yes How Accessible: Accessible via wheelchair, Accessible via walker Additional Comments: wife uses trasport chair for mobility in the home   Discharge Living Setting Plans for Discharge Living Setting: Patient's home Type of Home at Discharge: House Discharge Home Layout: One level Discharge Home Access: Ramped entrance Discharge Bathroom Shower/Tub: Walk-in shower Discharge Bathroom Toilet: Handicapped height Discharge Bathroom Accessibility: Yes How Accessible: Accessible via walker   Social/Family/Support Systems Patient Roles: Spouse Contact Information: [phone] Anticipated Caregiver: Gavin Pound Ability/Limitations of Caregiver: supervision, she is in wheelchair but can help with cooking, preparing meds Caregiver Availability: 24/7 Discharge Plan Discussed with Primary Caregiver: Yes Is Caregiver In Agreement with Plan?: No Does Caregiver/Family have Issues with Lodging/Transportation while Pt is in Rehab?: No   Goals Patient/Family Goal for Rehab: PT/OT mod I Expected length of stay: 7-10 days Pt/Family Agrees to Admission and willing to participate: Yes Program Orientation Provided & Reviewed with Pt/Caregiver Including Roles   & Responsibilities: Yes   Decrease burden of Care through IP rehab admission: not anticipated   Possible need for SNF placement upon discharge: not anticipated   Patient Condition: I have reviewed medical records from [facility] , spoken with CM, and patient and spouse. I met with patient at the bedside for inpatient rehabilitation assessment.  Patient  will benefit from ongoing PT, OT, and SLP, can actively participate in 3 hours of therapy a day 5 days of the week, and can make measurable gains during the admission.  Patient will also benefit from the coordinated team approach during an Inpatient Acute Rehabilitation admission.  The patient will receive intensive therapy as well as Rehabilitation physician, nursing, social worker, and care management interventions.  Due to safety, skin/wound care, disease management, medication administration, pain management, and patient education the patient requires 24 hour a day rehabilitation nursing.  The patient is currently Min A to CGA with mobility and basic ADLs.  Discharge setting and therapy post discharge at home with home health is anticipated.  Patient has agreed to participate in the Acute Inpatient Rehabilitation Program and will admit today.   Preadmission Screen Completed By:  Jeronimo Greaves, 02/25/2023 1:05 PM ______________________________________________________________________   Discussed status with Dr. Riley Kill on 02/25/23 at 930 and received approval for admission today.   Admission Coordinator:  Jeronimo Greaves, CCC-SLP, time 1307/Date 02/25/23    Assessment/Plan: Diagnosis:  TBI Does the need for close, 24 hr/day Medical supervision in concert with the patient's rehab needs make it unreasonable for this patient to be served in a less intensive setting? Yes Co-Morbidities requiring supervision/potential complications: HTN, DM, OSA , venous insufficiency with chronic LE edema Due to bladder management, bowel management,  safety, skin/wound care, disease management, medication administration, pain management, and patient education, does the patient require 24 hr/day rehab nursing? Yes Does the patient require coordinated care of a physician, rehab nurse, PT, OT, and SLP to address physical and functional deficits in the context of the above medical diagnosis(es)? Yes Addressing deficits in the following areas: balance, endurance, locomotion, strength, transferring, bowel/bladder control, bathing, dressing, feeding, grooming, toileting, cognition, and psychosocial support Can the patient actively participate in an intensive therapy program of at least 3 hrs of therapy 5 days a week? Yes The potential for patient to make measurable gains while on inpatient rehab is good Anticipated functional outcomes upon discharge from inpatient rehab: modified independent and supervision PT, modified independent and supervision OT, modified independent SLP Estimated rehab length of stay to reach the above functional goals is: 7-10d Anticipated discharge destination: Home 10. Overall Rehab/Functional Prognosis: good     MD Signature: Erick Colace M.D. [facility] Fellow Am Acad of Phys Med and Rehab Diplomate Am Board of Electrodiagnostic Med Fellow Am Board of Interventional Pain

## 2023-02-25 NOTE — Consult Note (Addendum)
WOC Nurse Consult Note: Reason for Consult: Left lower extremity wound Wound type: full thickness ? R/t venous insufficiency  Pressure Injury POA: NA  Measurement: 1.  L medial lower leg 2 cm x 1 cm x 0.1 cm; 2. L lateral lower leg 4 cm x 1.5 cm x 0.1 cm  Wound bed: 100% clean red  Drainage (amount, consistency, odor) minimal serosanguinous  Periwound:dry scaling skin, evidence of old healed ulcers (patient states these wax and wane, has not been seen by outside provider)  Dressing procedure/placement/frequency: Clean L lower leg wounds with NS, apply silver hydrofiber (Aquacel [place] [phone]) cut to fit wound beds daily and cover with silicone foam.  May lift foam daily to replace silver.  Change foam q3 days and prn soiling.   Patient would benefit with follow-up with vascular surgeon or wound care center for ongoing management of these recurrent wounds.   POC discussed with patient and bedside nurse. WOC team will not follow.  Re-consult if further needs arise.   Thank you,    Priscella Mann MSN, RN-BC, [employer] [phone]

## 2023-02-25 NOTE — Progress Notes (Signed)
Physical Therapy Treatment Patient Details Name: Joseph Ortega MRN: [id number] DOB: 30-Oct-1955 Today's Date: 02/25/2023   History of Present Illness 67 y.o. male s/p fall with hemorrhagic parenchymal contusion in R temporal lobe, L posterior parietal scalp hematoma and pelvic hematoma.PMH includes Anxiety, Arthritis, Depression, Diabetes mellitus, Headache, HTN, Hypothyroidism, Sleep apnea, Stroke (left hemi).    PT Comments  Pt greeted resting in bed and agreeable to session. Pt with slow progress this session with increased LLE weakness and pt requiring increased assist for transfers. Pt requiring up to mod A to power up to stand x4 throughout session with facilitation needed for anterior weight shift and support on rise. Pt unable to progress gait away from EOB as L knee buckling with weight bearing. With L knee blocked pt able to side step along EOB to HOB. Current plan remains appropriate to address deficits and maximize functional independence and decrease caregiver burden. Pt continues to benefit from skilled PT services to progress toward functional mobility goals.     If plan is discharge home, recommend the following: A little help with walking and/or transfers;A little help with bathing/dressing/bathroom;Assistance with cooking/housework;Assist for transportation;Help with stairs or ramp for entrance   Can travel by private vehicle        Equipment Recommendations  None recommended by PT    Recommendations for Other Services       Precautions / Restrictions Precautions Precautions: Fall Precaution Comments: hx of CVA Lt hemi Required Braces or Orthoses: Other Brace Other Brace: has an AFO for Lt foot but has not been wearing much lately, not at hospital Restrictions Weight Bearing Restrictions: No     Mobility  Bed Mobility Overal bed mobility: Needs Assistance Bed Mobility: Supine to Sit, Sit to Supine     Supine to sit: HOB elevated, Used rails, Min assist Sit to  supine: Mod assist   General bed mobility comments: min A to elevate trunk to sitting and manage LLE to EOB, mod A to return LLE to bed at end of session    Transfers Overall transfer level: Needs assistance Equipment used: Rolling walker (2 wheels) Transfers: Sit to/from Stand, Bed to chair/wheelchair/BSC Sit to Stand: Mod assist           General transfer comment: mod A to facilicate anterior weight shift and boost to stand, increased time and effortful to from hips leaving sitting surface to full upright standing    Ambulation/Gait Ambulation/Gait assistance: Min assist Gait Distance (Feet): 2 Feet Assistive device: Rolling walker (2 wheels) Gait Pattern/deviations: Step-to pattern, Step-through pattern, Decreased stride length, Decreased dorsiflexion - left, Shuffle, Wide base of support, Staggering left       General Gait Details: L knee buckling with attempts at stepping away from bed (attempted x2 with seated rest between), provided blocking to L knee to side step along EOB to [facility]   Stairs             Wheelchair Mobility     Tilt Bed    Modified Rankin (Stroke Patients Only) Modified Rankin (Stroke Patients Only) Pre-Morbid Rankin Score: Moderate disability Modified Rankin: Moderately severe disability     Balance Overall balance assessment: Needs assistance Sitting-balance support: No upper extremity supported, Feet supported Sitting balance-Leahy Scale: Fair     Standing balance support: Bilateral upper extremity supported, No upper extremity supported, During functional activity Standing balance-Leahy Scale: Poor Standing balance comment: more stable with UE support  Cognition Arousal: Alert Behavior During Therapy: WFL for tasks assessed/performed Overall Cognitive Status: No family/caregiver present to determine baseline cognitive functioning                                 General  Comments: reports he had tramadol for pain and has made him sleepy but awake and alert throughout session        Exercises General Exercises - Lower Extremity Long Arc Quad: AROM, Left, 10 reps, Seated Hip Flexion/Marching: AROM, Left, 10 reps, Seated    General Comments        Pertinent Vitals/Pain Pain Assessment Pain Assessment: Faces Faces Pain Scale: Hurts little more Pain Location: back Pain Descriptors / Indicators: Aching, Sore Pain Intervention(s): Monitored during session, Limited activity within patient's tolerance    Home Living                          Prior Function            PT Goals (current goals can now be found in the care plan section) Acute Rehab PT Goals Patient Stated Goal: to go to rehab PT Goal Formulation: With patient Time For Goal Achievement: 03/08/23 Progress towards PT goals: Not progressing toward goals - comment (increased LLE weakness this session)    Frequency    Min 1X/week      PT Plan      Co-evaluation              AM-PAC PT "6 Clicks" Mobility   Outcome Measure  Help needed turning from your back to your side while in a flat bed without using bedrails?: None Help needed moving from lying on your back to sitting on the side of a flat bed without using bedrails?: A Little Help needed moving to and from a bed to a chair (including a wheelchair)?: A Lot Help needed standing up from a chair using your arms (e.g., wheelchair or bedside chair)?: A Lot Help needed to walk in hospital room?: A Lot Help needed climbing 3-5 steps with a railing? : A Lot 6 Click Score: 15    End of Session Equipment Utilized During Treatment: Gait belt Activity Tolerance: Patient limited by lethargy Patient left: in bed;with call bell/phone within reach Nurse Communication: Mobility status PT Visit Diagnosis: Unsteadiness on feet (R26.81);Other abnormalities of gait and mobility (R26.89);History of falling (Z91.81);Hemiplegia  and hemiparesis;Other symptoms and signs involving the nervous system (R29.898) Hemiplegia - Right/Left: Left Hemiplegia - caused by:  (Hx of Lt hemi)     Time: 1200-1229 PT Time Calculation (min) (ACUTE ONLY): 29 min  Charges:    $Therapeutic Activity: 23-37 mins PT General Charges $$ ACUTE PT VISIT: 1 Visit                     Gurveer Colucci R. PTA Acute Rehabilitation Services Office: [phone]   Catalina Antigua 02/25/2023, 1:27 PM

## 2023-02-25 NOTE — Progress Notes (Signed)
Placed patient on CPAP for the night.  

## 2023-02-25 NOTE — H&P (Deleted)
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  Per chart review patient lives with wife.  1 level home with ramped entrance.  Wife is reportedly limited mobility.  Presented 05/02/2022 with headache and nonspecific chest pain.  Cranial CT scan showed acute hemorrhagic infarct affecting the right posterior parietal lobe.  The region of infarction measuring 7.6 x 3.6 x 4.1 cm.  Old infarction of the right frontal parietal vertex and right occipital lobe.  CT angiogram head and neck no large vessel occlusion.  MRI redemonstrated infarction with hemorrhage in the right temporoparietal region.  Patient did not receive tPA.  Echocardiogram with ejection fraction of 50 to 55% grade 1 diastolic dysfunction.  Neurology follow-up patient was placed on Cleviprex for blood pressure control.  Marland Kitchen  His aspirin was initially held and has since been resumed.  He was later cleared to begin subcutaneous heparin for DVT prophylaxis 05/05/2022.  Latest cranial CT scan 05/06/2022 unchanged no frank hematoma seen.  Awaiting plan for loop recorder.  Tolerating a regular consistency diet.  Therapy evaluations completed due to patient decreased functional mobility was admitted for a comprehensive rehab program.   Review of Systems  Constitutional:  Negative for chills and fever.  HENT:  Negative for hearing loss.   Eyes:  Negative for blurred vision and double vision.  Respiratory:  Negative for cough, shortness of breath and wheezing.   Cardiovascular:  Negative for chest pain, palpitations and leg swelling.  Gastrointestinal:  Positive for constipation. Negative for heartburn, nausea and vomiting.  Genitourinary:  Negative for  dysuria, flank pain and hematuria.  Musculoskeletal:  Positive for myalgias.  Skin:  Negative for rash.  Neurological:  Positive for weakness and headaches.  All other systems reviewed and are negative.       Past Medical History:  Diagnosis Date   Diabetes mellitus without complication (HCC)     Hypertension     Thyroid disease           Past Surgical History:  Procedure Laterality Date   HERNIA REPAIR       IR ANGIO INTRA EXTRACRAN SEL COM CAROTID INNOMINATE BILAT MOD SED   03/09/2021   IR ANGIO VERTEBRAL SEL VERTEBRAL BILAT MOD SED   03/09/2021   IR US GUIDE VASC ACCESS RIGHT   03/09/2021         Family History  Problem Relation Age of Onset   Diabetes Father      Social History:  reports that he has never smoked. He has never used smokeless tobacco. He reports that he does not drink alcohol and does not use drugs. Allergies: No Known Allergies       Medications Prior to Admission  Medication Sig Dispense Refill   Acetaminophen (TYLENOL PO) Take 1 tablet by mouth daily as needed (pain, fever, headache).       aspirin EC 81 MG EC tablet Take 1 tablet (81 mg total) by mouth daily. Swallow whole. 30 tablet 11   dapagliflozin propanediol (FARXIGA) 10 MG TABS tablet Take 10 mg by mouth daily.       docusate sodium (COLACE) 100 MG capsule Take 1 capsule (100 mg total) by mouth 2 (two)  times daily as needed for mild constipation. 10 capsule 0   DULoxetine (CYMBALTA) 30 MG capsule Take 30 mg by mouth 2 (two) times daily.       furosemide (LASIX) 40 MG tablet Take 40 mg by mouth daily.       guanFACINE (INTUNIV) 2 MG TB24 ER tablet Take 2 mg by mouth at bedtime.       insulin detemir (LEVEMIR) 100 UNIT/ML FlexPen Inject 39 Units into the skin 2 (two) times daily. (Patient taking differently: Inject 55 Units into the skin daily.) 15 mL 11   levothyroxine (SYNTHROID, LEVOTHROID) 125 MCG tablet Take 125 mcg by mouth daily before breakfast.       loratadine (CLARITIN) 10 MG tablet Take  10 mg by mouth at bedtime.       metoprolol tartrate (LOPRESSOR) 50 MG tablet Take 50 mg by mouth 2 (two) times daily.       NOVOLOG FLEXPEN 100 UNIT/ML FlexPen Inject 14 Units into the skin 3 (three) times daily with meals. (Patient taking differently: Inject 20-100 Units into the skin See admin instructions. Inject 20-100 units three times daily with meals per sliding scale.) 15 mL 11   potassium chloride SA (KLOR-CON M) 20 MEQ tablet Take 1 tablet (20 mEq total) by mouth 2 (two) times daily. (Patient taking differently: Take 20 mEq by mouth daily.) 60 tablet 0   rosuvastatin (CRESTOR) 40 MG tablet Take 40 mg by mouth daily.       traZODone (DESYREL) 50 MG tablet TAKE 1 TABLET BY MOUTH EVERYDAY AT BEDTIME (Patient taking differently: Take 50 mg by mouth at bedtime as needed for sleep.) 90 tablet 1   TRULICITY 3 MG/0.5ML SOPN Inject 3 mg into the skin every Friday.       valsartan (DIOVAN) 320 MG tablet Take 1 tablet (320 mg total) by mouth daily. 30 tablet 0   Vitamin D, Ergocalciferol, (DRISDOL) 50000 UNITS CAPS capsule Take 50,000 Units by mouth every Friday.       atorvastatin (LIPITOR) 40 MG tablet TAKE 1 TABLET BY MOUTH EVERY DAY (Patient not taking: Reported on 05/03/2022) 30 tablet 0   carvedilol (COREG) 12.5 MG tablet Take 1 tablet (12.5 mg total) by mouth 2 (two) times daily with a meal. (Patient not taking: Reported on 05/03/2022) 60 tablet 0   hydrochlorothiazide (HYDRODIURIL) 25 MG tablet TAKE 1 TABLET (25 MG TOTAL) BY MOUTH DAILY. (Patient not taking: Reported on 05/03/2022) 30 tablet 0          Home: Home Living Family/patient expects to be discharged to:: Private residence Living Arrangements: Spouse/significant other (Wife is wheelchair bound) Available Help at Discharge: Family, Available 24 hours/day Type of Home: House Home Access: Ramped entrance, Stairs to enter [employer] of Steps: ramp into the house then 2 steps with grab bar Home Layout: One level Bathroom  Shower/Tub: Health visitor: Handicapped height Bathroom Accessibility: Yes Home Equipment: Agricultural consultant (2 wheels), Shower seat, Grab bars - tub/shower, Grab bars - toilet, Rollator (4 wheels) Additional Comments: wife is in a wheelchair  Lives With: Spouse   Functional History: Prior Function Prior Level of Function : Needs assist, Independent/Modified Independent Mobility Comments: reports walked limited household distances only. Pt takes public transport to OP therapy appointments. ADLs Comments: Wife provides stand by assist for shower. Does not provide physical hands on assist.   Functional Status:  Mobility: Bed Mobility Overal bed mobility: Needs Assistance Bed Mobility: Supine to Sit Supine to sit: Min guard,  HOB elevated Sit to supine: Min assist, HOB elevated General bed mobility comments: min guard with trunk Transfers Overall transfer level: Needs assistance Equipment used: Rolling walker (2 wheels) Transfers: Sit to/from Stand Sit to Stand: Min assist General transfer comment: uses RUE only to power up, able to use LUE with RW Ambulation/Gait Ambulation/Gait assistance: Min assist Gait Distance (Feet): 75 Feet Assistive device: Rolling walker (2 wheels) Gait Pattern/deviations: Decreased stride length, Decreased dorsiflexion - left, Wide base of support, Trunk flexed, Step-through pattern, Decreased step length - left General Gait Details: Verbal cueing for left heel strike and neutral L foot positioning Gait velocity: reduced Gait velocity interpretation: <1.31 ft/sec, indicative of household ambulator   ADL: ADL Overall ADL's : Needs assistance/impaired Eating/Feeding: Set up, Sitting Eating/Feeding Details (indicate cue type and reason): assist to open containers as needed Grooming: Wash/dry hands, Wash/dry face, Oral care, Min guard, Standing Grooming Details (indicate cue type and reason): used LUE as gross assist Upper Body Bathing:  Minimal assistance, Standing Upper Body Bathing Details (indicate cue type and reason): assistance with RUE due to limited use of LUE Lower Body Bathing: Moderate assistance, Sit to/from stand Upper Body Dressing : Minimal assistance, Sitting Upper Body Dressing Details (indicate cue type and reason): donning hospital gown while seated EOB Lower Body Dressing: Maximal assistance, Sit to/from stand Lower Body Dressing Details (indicate cue type and reason): Assisted needed to don left hospital sock Toilet Transfer: Minimal assistance, Ambulation Toilet Transfer Details (indicate cue type and reason): simulated to recliner Toileting- Clothing Manipulation and Hygiene: Minimal assistance, Sitting/lateral lean General ADL Comments: able to use LUE for gross assist; hold toothbrush when applying toothpaste.   Cognition: Cognition Overall Cognitive Status: Impaired/Different from baseline Arousal/Alertness: Awake/alert Orientation Level: Oriented X4 Year: 2023 Month: February Day of Week: Correct Attention: Focused, Sustained Focused Attention: Appears intact Sustained Attention: Appears intact Memory: Impaired Memory Impairment:  (Immediate: 5/5 with repetition, delayed: 4/5 with cues: 1/1) Awareness: Appears intact Problem Solving: Impaired Problem Solving Impairment: Verbal complex (money: 0/3; time: 1/2) Executive Function: Sequencing, Occupational hygienist: Impaired Sequencing Impairment: Verbal complex (clock: 0/4) Organizing:  (backward digit span: 2/2) Cognition Arousal/Alertness: Awake/alert Behavior During Therapy: WFL for tasks assessed/performed, Flat affect Overall Cognitive Status: Impaired/Different from baseline Area of Impairment: Problem solving, Attention Current Attention Level: Selective Problem Solving: Slow processing General Comments: Mildly slower processing   Physical Exam: Blood pressure 139/67, pulse 63, temperature 97.9 F (36.6 C), temperature source  Oral, resp. rate 16, height 6\' 1"  (1.854 m), weight 112 kg, SpO2 96 %. Physical Exam Neurological:     Comments: Patient is alert.  No acute distress.  Oriented x 3 and follows commands.   General: No acute distress Mood and affect are appropriate Heart: Regular rate and rhythm no rubs murmurs or extra sounds Lungs: Clear to auscultation, breathing unlabored, no rales or wheezes Abdomen: Positive bowel sounds, soft nontender to palpation, nondistended Extremities: No clubbing, cyanosis, or edema Skin: No evidence of breakdown, no evidence of rash Neurologic: Cranial nerves II through XII intact, motor strength is 5/5 in Right  deltoid, bicep, tricep, grip, hip flexor, knee extensors, ankle dorsiflexor and plantar flexor 3-/5 left Delt, bi, tri, grip 4- left HF, KE,3- ADF Sensory exam normal sensation to light touch right lower extremity, absent LT sensation below the ankle LLE Cerebellar exam Limited by weakness on Left side  Musculoskeletal:  reduced Left ankle ROM, reduced Left shoulder ROM     Lab Results Last 48 Hours  Results for orders placed or performed during the hospital encounter of 05/02/22 (from the past 48 hour(s))  Glucose, capillary     Status: Abnormal    Collection Time: 05/05/22  4:16 PM  Result Value Ref Range    Glucose-Capillary 167 (H) 70 - 99 mg/dL      Comment: Glucose reference range applies only to samples taken after fasting for at least 8 hours.    Comment 1 Notify RN      Comment 2 Document in Chart    Glucose, capillary     Status: Abnormal    Collection Time: 05/05/22 10:00 PM  Result Value Ref Range    Glucose-Capillary 224 (H) 70 - 99 mg/dL      Comment: Glucose reference range applies only to samples taken after fasting for at least 8 hours.    Comment 1 Notify RN      Comment 2 Document in Chart    Basic metabolic panel     Status: Abnormal    Collection Time: 05/06/22  3:11 AM  Result Value Ref Range    Sodium 134 (L) 135 - 145 mmol/L     Potassium 3.5 3.5 - 5.1 mmol/L    Chloride 96 (L) 98 - 111 mmol/L    CO2 29 22 - 32 mmol/L    Glucose, Bld 183 (H) 70 - 99 mg/dL      Comment: Glucose reference range applies only to samples taken after fasting for at least 8 hours.    BUN 16 8 - 23 mg/dL    Creatinine, Ser 7.82 0.61 - 1.24 mg/dL    Calcium 9.0 8.9 - 95.6 mg/dL    GFR, Estimated >21 >30 mL/min      Comment: (NOTE) Calculated using the CKD-EPI Creatinine Equation (2021)      Anion gap 9 5 - 15      Comment: Performed at [facility], [address] [place], [place] [address]  CBC     Status: None    Collection Time: 05/06/22  3:11 AM  Result Value Ref Range    WBC 6.2 4.0 - 10.5 K/uL    RBC 4.42 4.22 - 5.81 MIL/uL    Hemoglobin 13.9 13.0 - 17.0 g/dL    HCT 46.9 62.9 - 52.8 %    MCV 92.5 80.0 - 100.0 fL    MCH 31.4 26.0 - 34.0 pg    MCHC 34.0 30.0 - 36.0 g/dL    RDW 41.3 24.4 - 01.0 %    Platelets 186 150 - 400 K/uL    nRBC 0.0 0.0 - 0.2 %      Comment: Performed at [facility], [address] [place], [place] [address]  Glucose, capillary     Status: Abnormal    Collection Time: 05/06/22  6:30 AM  Result Value Ref Range    Glucose-Capillary 194 (H) 70 - 99 mg/dL      Comment: Glucose reference range applies only to samples taken after fasting for at least 8 hours.    Comment 1 Notify RN      Comment 2 Document in Chart    Glucose, capillary     Status: Abnormal    Collection Time: 05/06/22 11:31 AM  Result Value Ref Range    Glucose-Capillary 271 (H) 70 - 99 mg/dL      Comment: Glucose reference range applies only to samples taken after fasting for at least 8 hours.    Comment 1 Notify  RN      Comment 2 Document in Chart    Glucose, capillary     Status: Abnormal    Collection Time: 05/06/22  4:23 PM  Result Value Ref Range    Glucose-Capillary 187 (H) 70 - 99 mg/dL      Comment: Glucose reference range applies only to samples taken after fasting for at least 8 hours.    Comment 1  Notify RN      Comment 2 Document in Chart    Glucose, capillary     Status: Abnormal    Collection Time: 05/06/22  9:21 PM  Result Value Ref Range    Glucose-Capillary 207 (H) 70 - 99 mg/dL      Comment: Glucose reference range applies only to samples taken after fasting for at least 8 hours.    Comment 1 Notify RN    Basic metabolic panel     Status: Abnormal    Collection Time: 05/07/22  3:54 AM  Result Value Ref Range    Sodium 136 135 - 145 mmol/L    Potassium 3.1 (L) 3.5 - 5.1 mmol/L    Chloride 98 98 - 111 mmol/L    CO2 25 22 - 32 mmol/L    Glucose, Bld 202 (H) 70 - 99 mg/dL      Comment: Glucose reference range applies only to samples taken after fasting for at least 8 hours.    BUN 18 8 - 23 mg/dL    Creatinine, Ser 2.84 0.61 - 1.24 mg/dL    Calcium 9.2 8.9 - 13.2 mg/dL    GFR, Estimated >44 >01 mL/min      Comment: (NOTE) Calculated using the CKD-EPI Creatinine Equation (2021)      Anion gap 13 5 - 15      Comment: Performed at [facility], [address] [place], [place] [address]  CBC     Status: None    Collection Time: 05/07/22  3:54 AM  Result Value Ref Range    WBC 6.1 4.0 - 10.5 K/uL    RBC 4.55 4.22 - 5.81 MIL/uL    Hemoglobin 14.6 13.0 - 17.0 g/dL    HCT 36.6 44.0 - 34.7 %    MCV 91.0 80.0 - 100.0 fL    MCH 32.1 26.0 - 34.0 pg    MCHC 35.3 30.0 - 36.0 g/dL    RDW 42.5 95.6 - 38.7 %    Platelets 173 150 - 400 K/uL    nRBC 0.0 0.0 - 0.2 %      Comment: Performed at [facility], [address], [place], [place] [address]  Glucose, capillary     Status: Abnormal    Collection Time: 05/07/22  6:05 AM  Result Value Ref Range    Glucose-Capillary 210 (H) 70 - 99 mg/dL      Comment: Glucose reference range applies only to samples taken after fasting for at least 8 hours.  Glucose, capillary     Status: Abnormal    Collection Time: 05/07/22 11:26 AM  Result Value Ref Range    Glucose-Capillary 228 (H) 70 - 99 mg/dL      Comment: Glucose  reference range applies only to samples taken after fasting for at least 8 hours.       Imaging Results (Last 48 hours)  CT HEAD WO CONTRAST ( )   Result Date: 05/06/2022 CLINICAL DATA:  Stroke, follow-up EXAM: CT HEAD WITHOUT CONTRAST TECHNIQUE: Contiguous axial images were obtained  from the base of the skull through the vertex without intravenous contrast. RADIATION DOSE REDUCTION: This exam was performed according to the departmental dose-optimization program which includes automated exposure control, adjustment of the mA and/or kV according to patient size and/or use of iterative reconstruction technique. COMPARISON:  05/02/2022 CT head FINDINGS: Brain: Redemonstrated infarction with petechial hemorrhage involving the right posterior parietal lobe, with the area of infarction measuring approximately 8.2 x 3.6 x 2.0 cm (AP x TR x CC) (series 4, image 18 and series 5, image 45), previously 8.1 x 3.6 x 3.9 cm when remeasured similarly, overall unchanged. No frank hematoma is seen. Overall unchanged degree of surrounding edema, without significant midline shift. Redemonstrated remote infarcts in the posterior right frontal lobe and right occipital lobe. No hydrocephalus or extra-axial collection. No evidence of additional acute infarct. Vascular: No hyperdense vessel. Atherosclerotic calcifications in the intracranial carotid and vertebral arteries. Skull: No acute fracture or focal lesion. Sinuses/Orbits: Clear paranasal sinuses. Status post bilateral lens replacements. Other: The mastoids are well aerated. IMPRESSION: 1. Overall unchanged size of a right parietal infarct with petechial hemorrhage. No frank hematoma is seen. Overall unchanged degree of surrounding edema, without significant midline shift. 2. Redemonstrated remote infarcts in the posterior right frontal lobe and right occipital lobe. Electronically Signed   By: Wiliam Ke M.D.   On: 05/06/2022 19:01           Blood pressure 139/67,  pulse 63, temperature 97.9 F (36.6 C), temperature source Oral, resp. rate 16, height 6\' 1"  (1.854 m), weight 112 kg, SpO2 96 %.   Medical Problem List and Plan: 1. Functional deficits secondary to right temporo-occipital hemorrhagic contusion with TBI             -patient may  shower             -ELOS/Goals: 7-10d, Mod I/Supervision 2.  Antithrombotics: -DVT/anticoagulation:  Pharmaceutical: Heparin             -antiplatelet therapy: Aspirin 81 mg daily resumed on 05/07/2022 3. Pain Management: Topamax 50 mg daily for headache 4. Mood/Behavior/Sleep: Provide emotional support             -antipsychotic agents: N/A 5. Neuropsych/cognition: This patient is capable of making decisions on his own behalf. 6. Skin/Wound Care: Routine skin checks 7. Fluids/Electrolytes/Nutrition: Routine in and outs with follow-up chemistries 8.  Hypothyroidism.  Synthroid 9.  Diabetes mellitus.  Hemoglobin A1c 8.3.  Currently on SSI.  Patient was on Levemir 39 units twice daily prior to admission as well as Farxiga 10 mg daily and Trulicity 3 mg every Thursday.  Resume as needed 10.  Essential hypertension.  Coreg 12.5 mg twice daily, HCTZ 25 mg daily.  Monitor with increased mobility 11.  Hyperlipidemia.  Crestor     Mcarthur Rossetti Angiulli, PA-C 05/07/2022 "I have personally performed a face to face diagnostic evaluation of this patient.  Additionally, I have reviewed and concur with the physician assistant's documentation above." Erick Colace M.D. [facility] Fellow Am [employer] of [employer] Med and Rehab Diplomate Am Board of Electrodiagnostic Med Fellow Am Board of Interventional Pain

## 2023-02-25 NOTE — Plan of Care (Signed)
  Problem: Education: Goal: Ability to describe self-care measures that may prevent or decrease complications (Diabetes Survival Skills Education) will improve Outcome: Progressing Goal: Individualized Educational Video(s) Outcome: Progressing   Problem: Coping: Goal: Ability to adjust to condition or change in health will improve Outcome: Progressing   Problem: Fluid Volume: Goal: Ability to maintain a balanced intake and output will improve Outcome: Progressing   Problem: Health Behavior/Discharge Planning: Goal: Ability to identify and utilize available resources and services will improve Outcome: Progressing Goal: Ability to manage health-related needs will improve Outcome: Progressing   Problem: Metabolic: Goal: Ability to maintain appropriate glucose levels will improve Outcome: Progressing   Problem: Nutritional: Goal: Maintenance of adequate nutrition will improve Outcome: Progressing Goal: Progress toward achieving an optimal weight will improve Outcome: Progressing   Problem: Skin Integrity: Goal: Risk for impaired skin integrity will decrease Outcome: Progressing   Problem: Tissue Perfusion: Goal: Adequacy of tissue perfusion will improve Outcome: Progressing   Problem: Education: Goal: Knowledge of General Education information will improve Description: Including pain rating scale, medication(s)/side effects and non-pharmacologic comfort measures Outcome: Progressing   Problem: Health Behavior/Discharge Planning: Goal: Ability to manage health-related needs will improve Outcome: Progressing   Problem: Clinical Measurements: Goal: Ability to maintain clinical measurements within normal limits will improve Outcome: Progressing Goal: Will remain free from infection Outcome: Progressing Goal: Diagnostic test results will improve Outcome: Progressing Goal: Respiratory complications will improve Outcome: Progressing Goal: Cardiovascular complication will  be avoided Outcome: Progressing   Problem: Activity: Goal: Risk for activity intolerance will decrease Outcome: Progressing   Problem: Nutrition: Goal: Adequate nutrition will be maintained Outcome: Progressing   Problem: Coping: Goal: Level of anxiety will decrease Outcome: Progressing   Problem: Elimination: Goal: Will not experience complications related to bowel motility Outcome: Progressing Goal: Will not experience complications related to urinary retention Outcome: Progressing   Problem: Pain Management: Goal: General experience of comfort will improve Outcome: Progressing   Problem: Safety: Goal: Ability to remain free from injury will improve Outcome: Progressing   Problem: Skin Integrity: Goal: Risk for impaired skin integrity will decrease Outcome: Progressing   Problem: Education: Goal: Knowledge of disease or condition will improve Outcome: Progressing Goal: Knowledge of secondary prevention will improve (MUST DOCUMENT ALL) Outcome: Progressing Goal: Knowledge of patient specific risk factors will improve Loraine Leriche N/A or DELETE if not current risk factor) Outcome: Progressing   Problem: Ischemic Stroke/TIA Tissue Perfusion: Goal: Complications of ischemic stroke/TIA will be minimized Outcome: Progressing   Problem: Coping: Goal: Will verbalize positive feelings about self Outcome: Progressing Goal: Will identify appropriate support needs Outcome: Progressing   Problem: Health Behavior/Discharge Planning: Goal: Ability to manage health-related needs will improve Outcome: Progressing Goal: Goals will be collaboratively established with patient/family Outcome: Progressing   Problem: Self-Care: Goal: Ability to participate in self-care as condition permits will improve Outcome: Progressing Goal: Verbalization of feelings and concerns over difficulty with self-care will improve Outcome: Progressing Goal: Ability to communicate needs accurately will  improve Outcome: Progressing   Problem: Nutrition: Goal: Risk of aspiration will decrease Outcome: Progressing Goal: Dietary intake will improve Outcome: Progressing

## 2023-02-25 NOTE — Inpatient Diabetes Management (Addendum)
Inpatient Diabetes Program Recommendations  AACE/ADA: New Consensus Statement on Inpatient Glycemic Control  Target Ranges:  Prepandial:   less than 140 mg/dL      Peak postprandial:   less than 180 mg/dL (1-2 hours)      Critically ill patients:  140 - 180 mg/dL    Latest Reference Range & Units 02/24/23 07:57 02/24/23 11:45 02/24/23 16:32 02/24/23 20:12 02/24/23 23:49 02/25/23 04:20 02/25/23 07:50  Glucose-Capillary 70 - 99 mg/dL 034 (H) 742 (H) 595 (H) 249 (H) 183 (H) 185 (H) 159 (H)    Latest Reference Range & Units 11/15/22 14:39 02/22/23 08:34  Hemoglobin A1C 4.8 - 5.6 % 8.8 (H) 10.1 (H)   Review of Glycemic Control  Diabetes history: DM2 Outpatient Diabetes medications: Levemir 40 units QPM, Novolog 0-15 units TID with meals, Farxiga 10 mg daily, Trulicity 3 mg Qweek Current orders for Inpatient glycemic control: Leveimr 20 units at bedtime, Novolog 0-15 units Q4H  Inpatient Diabetes Program Recommendations:    Insulin: Please consider increasing Levemir to 24 units at bedtime, change CBG frequency to AC&HS, and change Novolog 0-15 units to AC&HS.  Addendum 02/25/23@12 :20-Spoke with patient at bedside about diabetes and home regimen for diabetes control. Patient reports being followed by PCP for diabetes management and currently taking Levemir 40 units QPM, Novolog 0-20 units TID with meals, Farxiga 10 mg daily, Trulicity 3 mg Qweek (Sunday) as an outpatient for diabetes control. Patient reports taking DM medications as prescribed but notes he does forget to take them sometimes. Patient reports checking glucose 2-3 times per day and that it is usually 119-low 200's mg/dl.  Discussed A1C results (10.1% on 02/22/23) and explained that current A1C indicates an average glucose of 243 mg/dl over the past 2-3 months. Patient states that his prior A1C was 6% 3-4 months ago. Patient denies any changes with DM medications, no steroid use in past 2-3 months, reports not starting any new medication  in past few months, and not much change with diet. Patient reports he tries to follow a carb modified diet. Discussed importance of checking CBGs and maintaining good CBG control to prevent long-term and short-term complications. Stressed to the patient the importance of improving glycemic control to prevent further complications from uncontrolled diabetes. Patient reports that he is planning to change primary care providers and actually has an appointment with a new PCP on 02/26/23 but he will be unable to keep the appointment due to being in the hospital. Patient states his wife is suppose to call new PCP office to reschedule the appointment to establish care. Encouraged patient to check glucose 3-4 times per day and take glucometer with him to appointment so the provider can use the information to continue to make adjustments with DM medications if needed.  Patient verbalized understanding of information discussed and reports no further questions at this time related to diabetes.  Thanks, Orlando Penner, RN, MSN, CDCES Diabetes Coordinator Inpatient Diabetes Program [phone] (Team Pager from 8am to 5pm)

## 2023-02-26 DIAGNOSIS — S069X1D Unspecified intracranial injury with loss of consciousness of 30 minutes or less, subsequent encounter: Secondary | ICD-10-CM | POA: Diagnosis not present

## 2023-02-26 DIAGNOSIS — M961 Postlaminectomy syndrome, not elsewhere classified: Secondary | ICD-10-CM

## 2023-02-26 DIAGNOSIS — F4323 Adjustment disorder with mixed anxiety and depressed mood: Secondary | ICD-10-CM

## 2023-02-26 DIAGNOSIS — S069XAA Unspecified intracranial injury with loss of consciousness status unknown, initial encounter: Secondary | ICD-10-CM | POA: Insufficient documentation

## 2023-02-26 LAB — COMPREHENSIVE METABOLIC PANEL
ALT: 7 U/L (ref 0–44)
AST: 14 U/L — ABNORMAL LOW (ref 15–41)
Albumin: 2.7 g/dL — ABNORMAL LOW (ref 3.5–5.0)
Alkaline Phosphatase: 78 U/L (ref 38–126)
Anion gap: 5 (ref 5–15)
BUN: 24 mg/dL — ABNORMAL HIGH (ref 8–23)
CO2: 31 mmol/L (ref 22–32)
Calcium: 8.6 mg/dL — ABNORMAL LOW (ref 8.9–10.3)
Chloride: 99 mmol/L (ref 98–111)
Creatinine, Ser: 1 mg/dL (ref 0.61–1.24)
GFR, Estimated: >60 mL/min (ref >60)
Glucose, Bld: 252 mg/dL — ABNORMAL HIGH (ref 70–99)
Potassium: 3.9 mmol/L (ref 3.5–5.1)
Sodium: 135 mmol/L (ref 135–145)
Total Bilirubin: 0.5 mg/dL (ref <1.2)
Total Protein: 6.3 g/dL — ABNORMAL LOW (ref 6.5–8.1)

## 2023-02-26 LAB — GLUCOSE, CAPILLARY
Glucose-Capillary: 229 mg/dL — ABNORMAL HIGH (ref 70–99)
Glucose-Capillary: 255 mg/dL — ABNORMAL HIGH (ref 70–99)
Glucose-Capillary: 247 mg/dL — ABNORMAL HIGH (ref 70–99)
Glucose-Capillary: 283 mg/dL — ABNORMAL HIGH (ref 70–99)

## 2023-02-26 LAB — CBC WITH DIFFERENTIAL/PLATELET
Abs Immature Granulocytes: 0.04 10*3/uL (ref 0.00–0.07)
Basophils Absolute: 0 10*3/uL (ref 0.0–0.1)
Basophils Relative: 0 %
Eosinophils Absolute: 0.2 10*3/uL (ref 0.0–0.5)
Eosinophils Relative: 3 %
HCT: 37.7 % — ABNORMAL LOW (ref 39.0–52.0)
Hemoglobin: 12.4 g/dL — ABNORMAL LOW (ref 13.0–17.0)
Immature Granulocytes: 1 %
Lymphocytes Relative: 12 %
Lymphs Abs: 0.9 10*3/uL (ref 0.7–4.0)
MCH: 31.7 pg (ref 26.0–34.0)
MCHC: 32.9 g/dL (ref 30.0–36.0)
MCV: 96.4 fL (ref 80.0–100.0)
Monocytes Absolute: 0.7 10*3/uL (ref 0.1–1.0)
Monocytes Relative: 10 %
Neutro Abs: 5.6 10*3/uL (ref 1.7–7.7)
Neutrophils Relative %: 74 %
Platelets: 171 10*3/uL (ref 150–400)
RBC: 3.91 MIL/uL — ABNORMAL LOW (ref 4.22–5.81)
RDW: 14.2 % (ref 11.5–15.5)
WBC: 7.5 10*3/uL (ref 4.0–10.5)
nRBC: 0 % (ref 0.0–0.2)

## 2023-02-26 MED ORDER — INSULIN DETEMIR 100 UNIT/ML ~~LOC~~ SOLN
10.0000 [IU] | Freq: Every day | SUBCUTANEOUS | Status: DC
  Administered 2023-02-26: 10 [IU] via SUBCUTANEOUS
  Filled 2023-02-26 (×2): qty 0.1

## 2023-02-26 MED ORDER — ACETAMINOPHEN 325 MG PO TABS: 325.0000 mg | ORAL_TABLET | ORAL | Status: DC | PRN

## 2023-02-26 NOTE — Plan of Care (Signed)
  Problem: RH Problem Solving Goal: LTG Patient will demonstrate problem solving for (SLP) Description: LTG:  Patient will demonstrate problem solving for basic/complex daily situations with cues  (SLP) Flowsheets (Taken 02/26/2023 1651) LTG: Patient will demonstrate problem solving for (SLP): (moderately complex) -- LTG Patient will demonstrate problem solving for: Supervision   Problem: RH Memory Goal: LTG Patient will demonstrate ability for day to day (SLP) Description: LTG:   Patient will demonstrate ability for day to day recall/carryover during cognitive/linguistic activities with assist  (SLP) Flowsheets (Taken 02/26/2023 1651) LTG: Patient will demonstrate ability for day to day recall: New information LTG: Patient will demonstrate ability for day to day recall/carryover during cognitive/linguistic activities with assist (SLP): Supervision   Problem: RH Attention Goal: LTG Patient will demonstrate this level of attention during functional activites (SLP) Description: LTG:  Patient will will demonstrate this level of attention during functional activites (SLP) Flowsheets (Taken 02/26/2023 1651) Patient will demonstrate during cognitive/linguistic activities the attention type of: Selective Patient will demonstrate this level of attention during cognitive/linguistic activities in: Controlled LTG: Patient will demonstrate this level of attention during cognitive/linguistic activities with assistance of (SLP): Supervision   Problem: RH Awareness Goal: LTG: Patient will demonstrate awareness during functional activites type of (SLP) Description: LTG: Patient will demonstrate awareness during functional activites type of (SLP) Flowsheets (Taken 02/26/2023 1651) Patient will demonstrate during cognitive/linguistic activities awareness type of: Emergent LTG: Patient will demonstrate awareness during cognitive/linguistic activities with assistance of (SLP): Supervision
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
## 2023-02-26 NOTE — Plan of Care (Signed)
  Problem: RH Balance Goal: LTG Patient will maintain dynamic sitting balance (PT) Description: LTG:  Patient will maintain dynamic sitting balance with assistance during mobility activities (PT) Flowsheets (Taken 02/26/2023 1526) LTG: Pt will maintain dynamic sitting balance during mobility activities with:: Independent with assistive device  Goal: LTG Patient will maintain dynamic standing balance (PT) Description: LTG:  Patient will maintain dynamic standing balance with assistance during mobility activities (PT) Flowsheets (Taken 02/26/2023 1526) LTG: Pt will maintain dynamic standing balance during mobility activities with:: Supervision/Verbal cueing   Problem: Sit to Stand Goal: LTG:  Patient will perform sit to stand with assistance level (PT) Description: LTG:  Patient will perform sit to stand with assistance level (PT) Flowsheets (Taken 02/26/2023 1526) LTG: PT will perform sit to stand in preparation for functional mobility with assistance level: Independent with assistive device   Problem: RH Bed Mobility Goal: LTG Patient will perform bed mobility with assist (PT) Description: LTG: Patient will perform bed mobility with assistance, with/without cues (PT). Flowsheets (Taken 02/26/2023 1526) LTG: Pt will perform bed mobility with assistance level of: Independent with assistive device    Problem: RH Bed to Chair Transfers Goal: LTG Patient will perform bed/chair transfers w/assist (PT) Description: LTG: Patient will perform bed to chair transfers with assistance (PT). Flowsheets (Taken 02/26/2023 1526) LTG: Pt will perform Bed to Chair Transfers with assistance level: Independent with assistive device    Problem: RH Car Transfers Goal: LTG Patient will perform car transfers with assist (PT) Description: LTG: Patient will perform car transfers with assistance (PT). Flowsheets (Taken 02/26/2023 1526) LTG: Pt will perform car transfers with assist:: Supervision/Verbal cueing    Problem: RH Ambulation Goal: LTG Patient will ambulate in controlled environment (PT) Description: LTG: Patient will ambulate in a controlled environment, # of feet with assistance (PT). Flowsheets (Taken 02/26/2023 1526) LTG: Pt will ambulate in controlled environ  assist needed:: Independent with assistive device LTG: Ambulation distance in controlled environment: 78ft using LRAD Goal: LTG Patient will ambulate in home environment (PT) Description: LTG: Patient will ambulate in home environment, # of feet with assistance (PT). Flowsheets (Taken 02/26/2023 1526) LTG: Pt will ambulate in home environ  assist needed:: Independent with assistive device LTG: Ambulation distance in home environment: 26ft using LRAD   Problem: RH Stairs Goal: LTG Patient will ambulate up and down stairs w/assist (PT) Description: LTG: Patient will ambulate up and down # of stairs with assistance (PT) Flowsheets (Taken 02/26/2023 1526) LTG: Pt will ambulate up/down stairs assist needed:: Independent with assistive device LTG: Pt will  ambulate up and down number of stairs: 2 steps using support on R side to simulate grab bar at home

## 2023-02-26 NOTE — Plan of Care (Signed)
Problem: RH Balance Goal: LTG: Patient will maintain dynamic sitting balance (OT) Description: LTG:  Patient will maintain dynamic sitting balance with assistance during activities of daily living (OT) Flowsheets (Taken 02/26/2023 1300) LTG: Pt will maintain dynamic sitting balance during ADLs with: Independent with assistive device Goal: LTG Patient will maintain dynamic standing with ADLs (OT) Description: LTG:  Patient will maintain dynamic standing balance with assist during activities of daily living (OT)  Flowsheets (Taken 02/26/2023 1300) LTG: Pt will maintain dynamic standing balance during ADLs with: Independent with assistive device   Problem: Sit to Stand Goal: LTG:  Patient will perform sit to stand in prep for activites of daily living with assistance level (OT) Description: LTG:  Patient will perform sit to stand in prep for activites of daily living with assistance level (OT) Flowsheets (Taken 02/26/2023 1300) LTG: PT will perform sit to stand in prep for activites of daily living with assistance level: Independent with assistive device   Problem: RH Grooming Goal: LTG Patient will perform grooming w/assist,cues/equip (OT) Description: LTG: Patient will perform grooming with assist, with/without cues using equipment (OT) Flowsheets (Taken 02/26/2023 1300) LTG: Pt will perform grooming with assistance level of: Independent with assistive device    Problem: RH Bathing Goal: LTG Patient will bathe all body parts with assist levels (OT) Description: LTG: Patient will bathe all body parts with assist levels (OT) Flowsheets (Taken 02/26/2023 1300) LTG: Pt will perform bathing with assistance level/cueing: Minimal Assistance - Patient > 75% LTG: Position pt will perform bathing: Shower   Problem: RH Dressing Goal: LTG Patient will perform upper body dressing (OT) Description: LTG Patient will perform upper body dressing with assist, with/without cues (OT). Flowsheets (Taken  02/26/2023 1300) LTG: Pt will perform upper body dressing with assistance level of: Independent with assistive device Goal: LTG Patient will perform lower body dressing w/assist (OT) Description: LTG: Patient will perform lower body dressing with assist, with/without cues in positioning using equipment (OT) Flowsheets (Taken 02/26/2023 1300) LTG: Pt will perform lower body dressing with assistance level of: Independent with assistive device   Problem: RH Toileting Goal: LTG Patient will perform toileting task (3/3 steps) with assistance level (OT) Description: LTG: Patient will perform toileting task (3/3 steps) with assistance level (OT)  Flowsheets (Taken 02/26/2023 1300) LTG: Pt will perform toileting task (3/3 steps) with assistance level: Independent with assistive device   Problem: RH Light Housekeeping Goal: LTG Patient will perform light housekeeping w/assist (OT) Description: LTG: Patient will perform light housekeeping with assistance, with/without cues (OT). Flowsheets (Taken 02/26/2023 1300) LTG: Pt will perform light housekeeping with assistance level of: Supervision/Verbal cueing LTG: Pt will perform light housekeeping w/level of: Ambulate with device   Problem: RH Toilet Transfers Goal: LTG Patient will perform toilet transfers w/assist (OT) Description: LTG: Patient will perform toilet transfers with assist, with/without cues using equipment (OT) Flowsheets (Taken 02/26/2023 1300) LTG: Pt will perform toilet transfers with assistance level of: Independent with assistive device   Problem: RH Tub/Shower Transfers Goal: LTG Patient will perform tub/shower transfers w/assist (OT) Description: LTG: Patient will perform tub/shower transfers with assist, with/without cues using equipment (OT) Flowsheets (Taken 02/26/2023 1300) LTG: Pt will perform tub/shower stall transfers with assistance level of: Independent with assistive device LTG: Pt will perform tub/shower transfers  from: Walk in shower   Problem: RH Memory Goal: LTG Patient will demonstrate ability for day to day recall/carry over during activities of daily living with assistance level (OT) Description: LTG:  Patient will demonstrate ability for  day to day recall/carry over during activities of daily living with assistance level (OT). Flowsheets (Taken 02/26/2023 1300) LTG:  Patient will demonstrate ability for day to day recall/carry over during activities of daily living with assistance level (OT): Supervision
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
## 2023-02-26 NOTE — Progress Notes (Signed)
Inpatient Rehabilitation Center Individual Statement of Services  Patient Name:  Joseph Ortega  Date:  02/26/2023  Welcome to the Inpatient Rehabilitation Center.  Our goal is to provide you with an individualized program based on your diagnosis and situation, designed to meet your specific needs.  With this comprehensive rehabilitation program, you will be expected to participate in at least 3 hours of rehabilitation therapies Monday-Friday, with modified therapy programming on the weekends.  Your rehabilitation program will include the following services:  Physical Therapy (PT), Occupational Therapy (OT), Speech Therapy (ST), 24 hour per day rehabilitation nursing, Neuropsychology, Care Coordinator, Rehabilitation Medicine, Nutrition Services, Pharmacy Services, and Other  Weekly team conferences will be held on Wednesdays to discuss your progress.  Your Inpatient Rehabilitation Care Coordinator will talk with you frequently to get your input and to update you on team discussions.  Team conferences with you and your family in attendance may also be held.  Expected length of stay: 7-10 Days  Overall anticipated outcome:  MOD I  Depending on your progress and recovery, your program may change. Your Inpatient Rehabilitation Care Coordinator will coordinate services and will keep you informed of any changes. Your Inpatient Rehabilitation Care Coordinator's name and contact numbers are listed  below.  The following services may also be recommended but are not provided by the Inpatient Rehabilitation Center:   Home Health Rehabiltiation Services Outpatient Rehabilitation Services    Arrangements will be made to provide these services after discharge if needed.  Arrangements include referral to agencies that provide these services.  Your insurance has been verified to be:   [employer] Your primary doctor is:  [facility]  Pertinent information will be shared with your doctor and  your insurance company.  Inpatient Rehabilitation Care Coordinator:  Lavera Guise, [place] [phone] or [phone]  Information discussed with and copy given to patient by: Andria Rhein, 02/26/2023, 10:45 AM

## 2023-02-26 NOTE — Progress Notes (Signed)
PROGRESS NOTE   Subjective/Complaints:  Had BH assessment in ED 02/11/23 for suicidal ideation , had med adjustments but no inpt admit.  No longer having suicidal ideation  Lumbar and knee xrays reviewed, mild degenerative changes  ROS- neg CP, SOB, N/V/D, + Back pain and Head pain on Left  Objective:   DG Knee Complete 4 Views Left  Result Date: 02/25/2023 CLINICAL DATA:  Pain EXAM: LEFT KNEE - COMPLETE 4+ VIEW COMPARISON:  None Available. FINDINGS: Mild degenerative changes with joint space narrowing and spurring. No acute bony abnormality. Specifically, no fracture, subluxation, or dislocation. No joint effusion. IMPRESSION: No acute bony abnormality. Electronically Signed   By: Charlett Nose M.D.   On: 02/25/2023 22:04   DG Lumbar Spine 2-3 Views  Result Date: 02/25/2023 CLINICAL DATA:  Low back pain EXAM: LUMBAR SPINE - 2 VIEW COMPARISON:  02/21/2023 CT FINDINGS: Five lumbar type vertebral bodies are well visualized. Vertebral body height is well maintained. No anterolisthesis is noted. Mild osteophytic changes are seen. No soft tissue abnormality is noted. IMPRESSION: Mild degenerative change without acute abnormality. Electronically Signed   By: Alcide Clever M.D.   On: 02/25/2023 22:01   Recent Labs    02/24/23 0643 02/26/23 0608  WBC 7.4 7.5  HGB 12.7* 12.4*  HCT 38.0* 37.7*  PLT 191 171   Recent Labs    02/24/23 0643 02/26/23 0608  NA 139 135  K 3.8 3.9  CL 102 99  CO2 30 31  GLUCOSE 126* 252*  BUN 28* 24*  CREATININE 1.24 1.00  CALCIUM 8.8* 8.6*    Intake/Output Summary (Last 24 hours) at 02/26/2023 0843 Last data filed at 02/26/2023 1610 Gross per 24 hour  Intake --  Output 1600 ml  Net -1600 ml        Physical Exam: Vital Signs Blood pressure 128/66, pulse (!) 58, temperature 98 F (36.7 C), temperature source Oral, resp. rate 18, weight 112.3 kg, SpO2 94%.   General: No acute distress Mood  and affect are appropriate Heart: Regular rate and rhythm no rubs murmurs or extra sounds Lungs: Clear to auscultation, breathing unlabored, no rales or wheezes Abdomen: Positive bowel sounds, soft nontender to palpation, nondistended Extremities: No clubbing, cyanosis, or edema Skin: No evidence of breakdown, no evidence of rash Neurologic: Cranial nerves II through XII intact, motor strength is 5/5 in RIght and 3- left  deltoid, bicep, tricep, grip, hip flexor, knee extensors, ankle dorsiflexor and plantar flexor Sensory exam normal sensation to light touch and proprioception in bilateral upper and lower extremities Cerebellar exam normal finger to nose to finger as well as heel to shin in bilateral upper and lower extremities Musculoskeletal: Full range of motion in all 4 extremities. No joint swelling   Assessment/Plan: 1. Functional deficits which require 3+ hours per day of interdisciplinary therapy in a comprehensive inpatient rehab setting. Physiatrist is providing close team supervision and 24 hour management of active medical problems listed below. Physiatrist and rehab team continue to assess barriers to discharge/monitor patient progress toward functional and medical goals  Care Tool:  Bathing              Bathing assist  Upper Body Dressing/Undressing Upper body dressing        Upper body assist      Lower Body Dressing/Undressing Lower body dressing            Lower body assist       Toileting Toileting    Toileting assist       Transfers Chair/bed transfer  Transfers assist           Locomotion Ambulation   Ambulation assist              Walk 10 feet activity   Assist           Walk 50 feet activity   Assist           Walk 150 feet activity   Assist           Walk 10 feet on uneven surface  activity   Assist           Wheelchair     Assist               Wheelchair 50 feet with  2 turns activity    Assist            Wheelchair 150 feet activity     Assist          Blood pressure 128/66, pulse (!) 58, temperature 98 F (36.7 C), temperature source Oral, resp. rate 18, weight 112.3 kg, SpO2 94%.  Medical Problem List and Plan: 1. 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  Antithrombotics: -DVT/anticoagulation:  Pharmaceutical: Lovenox added per discussion with NS.              -antiplatelet therapy:  Hold ASA 3. Pain Management: back and left knee pain since fall             --Headaches worse but managed with tramadol 4. Mood/Behavior/Sleep: LCSW to follow for evaluation and support.              -antipsychotic agents: N/A 5. Neuropsych/cognition: This patient is capable of making decisions on his own behalf. 6. Skin/Wound Care: Routine pressure relief measures             --will add Vitamin C/Zincd 7. Fluids/Electrolytes/Nutrition: Monitor  I/O. Check CMET in am.     Latest Ref Rng & Units 02/26/2023    6:08 AM 02/24/2023    6:43 AM 02/22/2023    8:34 AM  BMP  Glucose 70 - 99 mg/dL 657  846  962   BUN 8 - 23 mg/dL 24  28  20    Creatinine 0.61 - 1.24 mg/dL 9.52  8.41  3.24   Sodium 135 - 145 mmol/L 135  139  143   Potassium 3.5 - 5.1 mmol/L 3.9  3.8  3.3   Chloride 98 - 111 mmol/L 99  102  107   CO2 22 - 32 mmol/L 31  30  26    Calcium 8.9 - 10.3 mg/dL 8.6  8.8  9.0     8. M0NU poorly controlled: Hgb A1c-10.1--was 8.6 10/2022. Tends to miss insulin doses per reports             --was on Levemir 40 u pm, trulicity 3 mg/wk, Novolog TID, Farxiaga 10 mg             --  monitor BS ac/hs and titrate Levemir to home dose.   CBG (last 3)  Recent Labs    02/25/23 1551 02/25/23 2144 02/26/23 0627  GLUCAP 267* 183* 229*    9. HTN: Monitor BP TID. Continue Coreg bid and hydrochlorothiazide Vitals:   02/26/23 0621 02/26/23 0624  BP:  139/69 128/66  Pulse: 63 (!) 58  Resp: 18   Temp: 98 F (36.7 C)   SpO2: 93% 94%    10. Acute on chronic headaches: Worse now but tramadol prn effective --Continue Topamax 11.  ABLA/Pelvic hematoma: Monitor H/H serially. Recheck CBC in am    Latest Ref Rng & Units 02/26/2023    6:08 AM 02/24/2023    6:43 AM 02/22/2023    8:34 AM  CBC  WBC 4.0 - 10.5 K/uL 7.5  7.4  7.2   Hemoglobin 13.0 - 17.0 g/dL 29.5  28.4  13.2   Hematocrit 39.0 - 52.0 % 37.7  38.0  40.7   Platelets 150 - 400 K/uL 171  191  230     12. Lymphedema w/stasis ulcer LLE: Daily wound care with silver Hydrofiber for moisture control and antimicrobial effect.  13. OSA: Continue CPAP.  14.  GB wall thickening: No signs of cholecystitis 15. Anxiety/Depression: controlled on buspar and Cymbalta  (intergrated psychology)  16. Tinea pedis left: Continue Clotrimazole for local measures.  17. Constipation: Will start Miralax 34 g today followed by 17 grams daily.     LOS: 1 days A FACE TO FACE EVALUATION WAS PERFORMED  Erick Colace 02/26/2023, 8:43 AM

## 2023-02-26 NOTE — Progress Notes (Signed)
Inpatient Rehabilitation  Patient information reviewed and entered into eRehab system by Demarrio Menges Gentry, OTR/L, Rehab Quality Coordinator.   Information including medical coding, functional ability and quality indicators will be reviewed and updated through discharge.   

## 2023-02-26 NOTE — Discharge Instructions (Addendum)
Inpatient Rehab Discharge Instructions  [facility] Discharge date and time:    Activities/Precautions/ Functional Status: Activity: no lifting, driving, or strenuous exercise till cleared by MD Diet: cardiac diet and diabetic diet Wound Care: none needed   Functional status:  ___ No restrictions     ___ Walk up steps independently ___ 24/7 supervision/assistance   ___ Walk up steps with assistance _X__ Intermittent supervision/assistance  ___ Bathe/dress independently ___ Walk with walker     ___ Bathe/dress with assistance ___ Walk Independently    ___ Shower independently ___ Walk with assistance    _X__ Shower with supervision  _X__ No alcohol     ___ Return to work/school ________    COMMUNITY REFERRALS UPON DISCHARGE:    Outpatient: PT     OT                Agency: [facility] Phone: [phone]              Appointment Date/Time: Please allow 5-7 for scheduling to reach out for an appointment.    Special Instructions: Need to wear CPAP at nights 2.  Use Dexcom to monitor your blood sugars. Use novolog sliding scale as at home. Contact PCP for input to titrate your insulin to home dose if you blood sugars start running high.    My questions have been answered and I understand these instructions. I will adhere to these goals and the provided educational materials after my discharge from the hospital.  Patient/Caregiver Signature _______________________________ Date __________  Clinician Signature _______________________________________ Date __________  Please bring this form and your medication list with you to all your follow-up doctor's appointments.

## 2023-02-26 NOTE — Progress Notes (Addendum)
Inpatient Rehabilitation Care Coordinator Assessment and Plan Patient Details  Name: Joseph Ortega MRN: [id number] Date of Birth: 08/11/1955  Today's Date: 02/26/2023  Hospital Problems: Principal Problem:   TBI (traumatic brain injury) [facility]) Active Problems:   Stroke (cerebrum) [facility])  Past Medical History:  Past Medical History:  Diagnosis Date   Anxiety    Arthritis    Depression    Diabetes mellitus without complication (HCC)    Headache    Hypertension    Hypothyroidism    Sleep apnea    wears CPAP   Stroke (HCC)    1 stroke 2/24, and 2 strokes on 03/09/21   Thyroid disease    Past Surgical History:  Past Surgical History:  Procedure Laterality Date   ANTERIOR CERVICAL DECOMP/DISCECTOMY FUSION N/A 11/23/2022   Procedure: Anterior Cervical Decompression Fusion  Cervical four-five;  Surgeon: Lisbeth Renshaw, MD;  Location: [facility] OR;  Service: Neurosurgery;  Laterality: N/A;   GROIN DEBRIDEMENT Left    ingrown hair, possibly abscesses, surgery to treat infection   HERNIA REPAIR Left    inguinal   IR ANGIO INTRA EXTRACRAN SEL COM CAROTID INNOMINATE BILAT MOD SED  03/09/2021   IR ANGIO VERTEBRAL SEL VERTEBRAL BILAT MOD SED  03/09/2021   IR US GUIDE VASC ACCESS RIGHT  03/09/2021   LOOP RECORDER INSERTION N/A 05/07/2022   Procedure: LOOP RECORDER INSERTION;  Surgeon: Lanier Prude, MD;  Location: [facility] INVASIVE CV LAB;  Service: Cardiovascular;  Laterality: N/A;   Social History:  reports that he has never smoked. He has never used smokeless tobacco. He reports that he does not drink alcohol and does not use drugs.  Family / Support Systems Marital Status: Married How Long?: n/a Patient Roles: Spouse Spouse/Significant Other: [name] Children: n/a Other Supports: n/a Anticipated Caregiver: [name] Spouse (WC Bound) Ability/Limitations of Caregiver: Only able to provide supervision. Assists with cooking and meds. Caregiver Availability: 24/7 Family Dynamics:  support form spouse/friends.  Social History Preferred language: English Religion:  Cultural Background: n/a Education: n/a Primary school teacher - How often do you need to have someone help you when you read instructions, pamphlets, or other written material from your doctor or pharmacy?: Sometimes Writes: Yes Employment Status: Disabled Date Retired/Disabled/Unemployed: n/a Marine scientist Issues: n/a Guardian/Conservator: n/a   Abuse/Neglect Abuse/Neglect Assessment Can Be Completed: Yes Physical Abuse: Denies Verbal Abuse: Denies Sexual Abuse: Denies Exploitation of patient/patient's resources: Denies Self-Neglect: Denies  Patient response to: Social Isolation - How often do you feel lonely or isolated from those around you?: Never  Emotional Status Pt's affect, behavior and adjustment status: Pleasant, very drowsy today. Recent Psychosocial Issues: Coping Psychiatric History: hx of anxiety and depression Substance Abuse History: n/a  Patient / Family Perceptions, Expectations & Goals Pt/Family understanding of illness & functional limitations: Yes. Premorbid pt/family roles/activities: Independent prior. Spouse is WC bound. Anticipated changes in roles/activities/participation: Anticpating MOD I goals Pt/family expectations/goals: MOD I  Building surveyor: None Premorbid Home Care/DME Agencies: Other (Comment) Adult nurse (2 wheels), Grab bars - tub/shower, Grab bars - toilet, Rollator (4 wheels), Adaptive equipment, BSC/3in1, Shower seat - built in) Transportation available at discharge: friends Is the patient able to respond to transportation needs?: Yes In the past 12 months, has lack of transportation kept you from medical appointments or from getting medications?: No In the past 12 months, has lack of transportation kept you from meetings, work, or from getting things needed for daily living?: No Resource referrals recommended:  Neuropsychology  Discharge Planning  Living Arrangements: Spouse/significant other Support Systems: Spouse/significant other Type of Residence: Private residence (1 level ramp entrance) Insurance Resources: Media planner (specify) Multimedia programmer Medicare) Financial Resources: SSD Financial Screen Referred: No Living Expenses: Lives with family Money Management: Patient, Spouse Does the patient have any problems obtaining your medications?: No Home Management: Independent Patient/Family Preliminary Plans: Plans to remain independent, spouse able to assist with medication management Care Coordinator Barriers to Discharge: Lack of/limited family support, Decreased caregiver support Care Coordinator Barriers to Discharge Comments: spouse WC level able to provide supervision only. Care Coordinator Anticipated Follow Up Needs: HH/OP Expected length of stay: 7-10 Days  Clinical Impression Patient previously admitted on CIR. SW met with patient, called spouse at bedside and left a VM.  Pt returning home with spouse who is WC level. Spouse able to to provide supervision only, assist with meals and manage medications. 1 level home, ramp to enter. Patient currently has a RW, rollator, shower seat and BSC. No additional questions or concerns.  Andria Rhein 02/26/2023, 2:48 PM
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
## 2023-02-26 NOTE — Progress Notes (Signed)
Inpatient Rehabilitation Admission Medication Review by a Pharmacist  A complete drug regimen review was completed for this patient to identify any potential clinically significant medication issues.  High Risk Drug Classes Is patient taking? Indication by Medication  Antipsychotic Yes, as an intravenous medication PRN prochlorperazine PO/PR/IV - nausea  Anticoagulant Yes Enoxaparin - VTE prophylaxis  Antibiotic Yes Clotrimazole cream - tinea pedis left foot Mupirocin nasal ointment x 5 days - positive MRSA PCR  Opioid Yes PRN Tramadol - moderate pain  Antiplatelet No   Hypoglycemics/insulin Yes Insulin detemir, SSI - DM Type 2  Vasoactive Medication Yes Carvedilol, lisinopril, hydrochlorothiazide - hypertension  Chemotherapy No   Other Yes Buspirone, duloxetine - anxiety/depression Diclofenac gel (L knee) - topical pain relief Gabapentin - neuropathic pain Levetiracetam x 7 days - seizure prophylaxis Levothyroxine - hypothyroidism Rosuvastatin - hyperlipidemia Topiramate - chronic headache prophylaxis Trazodone - sleep Docusate, miralax - laxatives Vitamin C, zinc - supplements  PRNs: Acetaminophen - mild pain Maalox - indigestion Diphenhydramine - itching Guaifenesin/dextromethorphan - cough Melatonin - sleep Tizanidine - muscle spasms Bisacodyl, Fleets enema - constipation     Type of Medication Issue Identified Description of Issue Recommendation(s)  Drug Interaction(s) (clinically significant)     Duplicate Therapy     Allergy     No Medication Administration End Date     Incorrect Dose     Additional Drug Therapy Needed     Significant med changes from prior encounter (inform family/care partners about these prior to discharge). Aspirin 81 mg discontinued.  Per discharge summary, to resume: Dagagloflozin, Trulicity, guanfacine ER, potassium chloride.  New Keppra x 7 days  New Vitamin C and Zinc, Diclofenac gel. Resume if/when clinically  indicated.  Monitor glucose, blood pressure and electrolytes for any need to resume. Would need to provide own supply of Trulicity (not stocked at [facility] [place]).   Communicate changes with patient/family prior to discharge.  Other  Of note, currently on hydrochlorothiazide 12.5 mg BID.  Was taking BID prn fluid/edema prior to admission. And KCl 20 mEq if taking hydrochlorothiazide.  Monitor renal function and electrolytes Add potassium chloride if K+ falls below normal range; 3.9 today. Next bmet due 02/28/23.    Clinically significant medication issues were identified that warrant physician communication and completion of prescribed/recommended actions by midnight of the next day:  No  Pharmacist comments:  - Enoxaparin for VTE prophylaxis added on CIR per okay with Neurology. - Levetiracetam and Muprocin stop dates in place; both begun while inpatient.  Time spent performing this drug regimen review (minutes):  [id number]  Dennie Fetters, [place] 02/26/2023 9:17 AM

## 2023-02-27 DIAGNOSIS — S069X1D Unspecified intracranial injury with loss of consciousness of 30 minutes or less, subsequent encounter: Secondary | ICD-10-CM | POA: Diagnosis not present

## 2023-02-27 DIAGNOSIS — M961 Postlaminectomy syndrome, not elsewhere classified: Secondary | ICD-10-CM | POA: Diagnosis not present

## 2023-02-27 DIAGNOSIS — F4323 Adjustment disorder with mixed anxiety and depressed mood: Secondary | ICD-10-CM | POA: Diagnosis not present

## 2023-02-27 LAB — GLUCOSE, CAPILLARY
Glucose-Capillary: 216 mg/dL — ABNORMAL HIGH (ref 70–99)
Glucose-Capillary: 260 mg/dL — ABNORMAL HIGH (ref 70–99)
Glucose-Capillary: 271 mg/dL — ABNORMAL HIGH (ref 70–99)
Glucose-Capillary: 262 mg/dL — ABNORMAL HIGH (ref 70–99)

## 2023-02-27 MED ORDER — SENNOSIDES-DOCUSATE SODIUM 8.6-50 MG PO TABS
1.0000 | ORAL_TABLET | Freq: Two times a day (BID) | ORAL | Status: DC
  Administered 2023-02-27 – 2023-03-01 (×5): 1 via ORAL
  Filled 2023-02-27 (×5): qty 1

## 2023-02-27 MED ORDER — INSULIN DETEMIR 100 UNIT/ML ~~LOC~~ SOLN
15.0000 [IU] | Freq: Every day | SUBCUTANEOUS | Status: DC
  Administered 2023-02-27: 15 [IU] via SUBCUTANEOUS
  Filled 2023-02-27 (×2): qty 0.15

## 2023-02-27 NOTE — Progress Notes (Signed)
Physical Therapy Session Note  Patient Details  Name: Joseph Ortega MRN: [id number] Date of Birth: Nov 08, 1955  Today's Date: 02/27/2023 PT Individual Time: 1351-1502 PT Individual Time Calculation (min): 71 min   Short Term Goals: Week 1:  PT Short Term Goal 1 (Week 1): Pt will perform supine<>sit using bed features as needed with min A PT Short Term Goal 2 (Week 1): Pt will perform sit<>stands using LRAD with min A PT Short Term Goal 3 (Week 1): Pt will perform bed<>chair transfers using LRAD with min A PT Short Term Goal 4 (Week 1): Pt will ambulate at least 36ft using LRAD with min A PT Short Term Goal 5 (Week 1): Pt will initiate stair navigation training  Skilled Therapeutic Interventions/Progress Updates:    Pt received asleep, supported up in bed, and easily awakens and is agreeable to therapy session.   Supine>sitting R EOB, HOB elevated and using bedrail, with increased time and min A to rotate trunk towards R, manage L UE, and rotate L hip around to the EOB - pt relies heavily on R UE support on bedrail to assist with all of these movements. Therapist educated pt on potential need for at least a bedrail for mod-I bed mobility at home depending on his progress.  Sitting EOB using R UE support and lateral leaning onto R elbow while donning shoes total A. Scooting hips towards EOB with min A for L side and max multimodal cuing for head/hips relationship.   Sit>stand partially elevated EOB>RW with mod A and significantly increased time for rising to stand - requires max cuing to increase anterior trunk lean while rising.  L stand pivot EOB>TIS w/c with skilled mod A for balance and L LE management as it tends to excessively externally rotate and have poor ability to step laterally/abduct it during transfer - cuing for keeping AD close and turning it to complete transfer.   Transported to/from gym in w/c for time management and energy conservation.  Sit>stands from TIS w/c>RW with  heavy min A,  but not assisting too much or too soon because pt very slow to rise - pt able to place L hand up on RW prior to initiating transfer using his R hand to assist it as needed - requires manual assist to bring L LE back underneath BOS or assists it with his R foot - cuing throughout to bring trunk anteriorly while rising, pt biases towards his R side.  Gait training 63ft +24ft (seated break) using RW with skilled min A for safety/LLE management and +2 w/c follow for safety due to fatigue, but on 2nd walk able to turn and sit on mat. Pt demonstrating the following gait deviations with therapist providing the described cuing and facilitation for improvement:  - L lateral trunk flexion  - L hip posteriorly rotated compared to R along with trunk rotation towards L - poor L LE foot clearance with excessive hip external rotation, improves some on 2nd gait trial with increased foot clearance - achieves mostly reciprocal stepping pattern, but sometimes has poor L LE foot clearance causing more of a step-to pattern - very slow gait speed, non functional  - only ambulating in straight line at this time  Repeated sit<>stands x3reps from elevated mat to RW with min A! - on initial stand uses backs of legs for support against mat, but on final 2 reps is able to scoot hips forward and rises without using legs on mat for support - continued cuing for anterior weight  shifting.   L stand pivot EOM>w/c using RW with min A and cuing for sequencing, continues to have difficulty taking L posterolateral steps with L LE to transfer in this direction.  Transported back to room and pt requesting to return to bed to rest.   Sit>stand w/c>RW heavy min A as described above, R stand pivot to EOB using RW with skilled min A and cuing pt for sequencing and AD management. Pt reports need to use bathroom - standing with min A for balance pt able to manage LB clothing and urinal with set-up assist - has uncontrolled sit  onto bed after finishing voiding, likely due to fatigue, requiring assist to control descent.  Sit>supine with mod A for B LE management onto bed. Pt left supine in bed with needs in reach, bed alarm on, and bedrails raised per pt request.   Therapy Documentation Precautions:  Precautions Precautions: Fall, Other (comment) Precaution Comments: hx of CVA Lt hemi Required Braces or Orthoses: Other Brace Other Brace: has an AFO for Lt foot but has not been wearing much lately, not at hospital Restrictions Weight Bearing Restrictions: No   Pain: Continues to report some back pain, unrated, but does not limit his participation in session. Reports he has used Volatern gel on knees 2x already today for pain management - aware of MD recommendation on use for most effectiveness.     Therapy/Group: Individual Therapy  Ginny Forth , PT, DPT, NCS, CSRS 02/27/2023, 10:47 AM

## 2023-02-27 NOTE — Patient Care Conference (Signed)
Inpatient RehabilitationTeam Conference and Plan of Care Update Date: 02/27/2023   Time: 10:44 AM    Patient Name: Joseph Ortega      Medical Record Number: [id number]  Date of Birth: 05/05/1955 Sex: Male         Room/Bed: 4W14C/4W14C-01 Payor Info: Payor: [employer] MEDICARE / Plan: [employer] MEDICARE HMO / Product Type: *No Product type* /    Admit Date/Time:  02/25/2023  4:35 PM  Primary Diagnosis:  TBI (traumatic brain injury) [facility])  Hospital Problems: Principal Problem:   TBI (traumatic brain injury) [facility]) Active Problems:   Stroke (cerebrum) [facility])    Expected Discharge Date: Expected Discharge Date: 03/12/23  Team Members Present: Physician leading conference: Dr. Claudette Laws Social Worker Present: Lavera Guise, BSW Nurse Present: Chana Bode, RN PT Present: Casimiro Needle, PT OT Present: Mariann Barter, OT SLP Present: Everardo Pacific, SLP PPS Coordinator present : Fae Pippin, SLP     Current Status/Progress Goal Weekly Team Focus  Bowel/Bladder   Pt is currently continent with B/B with some episodes of bladder incontinence. LBM 02/21/23   Pt will regain full bladder incontinence   Assist pt with time toileting q4hrs  while awake    Swallow/Nutrition/ Hydration               ADL's   residual L sided weakness d/t previous CVA, UB BADLs mod A, LB BADLs mod-max A, toileting max A, sit<>stands using RW mod-max A using RW with increased time to power up to standing position, mod A stand pivot with RW; Barriers- extremely slow motor movements when powering up to stand, pain, weakness   mod I overall; min A for bathing as his wife helps him with this   BADL retraining, activity tolerance, functional transfer trianing, bed mobility    Mobility   max A bed mobility relying on bed features, max A sit>stand using RW, skilled mod A stand pivot transfer using RW, skilled gait training using RW up to 36ft with +2 close w/c follow   mod-I based on pt's home  support  initial evaluation, bed mobility training, transfer training, gait training, initiating D/C planning    Communication                Safety/Cognition/ Behavioral Observations  min to mod A for problem solving, awareness, attention, memory   sup A   implement strategies, pt/ caregiver education, DC planning    Pain   Verbalizes pain to to left knee and lower back. Pain well manaegd with PRN/scheduled pain medications   Pt will be free from pain with pain level,3   Assesspt for pain qshift/prn and provide education on prn pain meds.    Skin   Left leg wound with aquacel/foam dressing in place   Pt will maintain skin intergrity with no breakdowninfections  Assess skin for infection and promote healing      Discharge Planning:  Discharging back home with spouse at [facility] level, supervision only). Ramped entrance. Spouse assists with meals and medication. Patient has: RW, rollator, BSc and shower seat   Team Discussion: Patient  with TBI right contusion after fall; previous left sided weakness, uncontrolled DM and slow motor movement.  Patient on target to meet rehab goals: yes, currently needs min assist for sit - stand from an elevated surface, mod assist for upper body care and mod - max for lower body care with max assist for toileting.  Need min - mod assist for cognition, memory and problem solving. Goals for  discharge set for mod I overall.  *See Care Plan and progress notes for long and short-term goals.   Revisions to Treatment Plan:  N/a   Teaching Needs: Safety, medications, transfers, toileting, etc.   Current Barriers to Discharge: Decreased caregiver support and Home enviroment access/layout  Possible Resolutions to Barriers: Family education     Medical Summary Current Status: has equipment from prior CVA admision, chronic low back pain Xrays show degenerative changes  Barriers to Discharge: Complicated Wound;Medical stability;Other  (comments);Uncontrolled Pain;Uncontrolled Diabetes  Barriers to Discharge Comments: chronic left hemiparesis Possible Resolutions to [employer]: adjustment of insulin, meds for back pain , voltaren gel for R knee pain   Continued Need for Acute Rehabilitation Level of Care: The patient requires daily medical management by a physician with specialized training in physical medicine and rehabilitation for the following reasons: Direction of a multidisciplinary physical rehabilitation program to maximize functional independence : Yes Medical management of patient stability for increased activity during participation in an intensive rehabilitation regime.: Yes Analysis of laboratory values and/or radiology reports with any subsequent need for medication adjustment and/or medical intervention. : Yes   I attest that I was present, lead the team conference, and concur with the assessment and plan of the team.   Chana Bode B 02/27/2023, 3:31 PM

## 2023-02-27 NOTE — Progress Notes (Signed)
Occupational Therapy Session Note  Patient Details  Name: Joseph Ortega MRN: [id number] Date of Birth: January 16, 1956  Today's Date: 02/27/2023 OT Individual Time: 0920-1029 OT Individual Time Calculation (min): 69 min    Short Term Goals: Week 1:  OT Short Term Goal 1 (Week 1): Pt will complete sit<>stands udring BADLs using LRAD with min A x1 OT Short Term Goal 2 (Week 1): Pt will complete stand pivot transfers for toileting and showers min A x1 OT Short Term Goal 3 (Week 1): Pt will tolerate standing at this sink during BADLs 2 minutes using LRAD OT Short Term Goal 4 (Week 1): Pt will complete UB dressing supervision  Skilled Therapeutic Interventions/Progress Updates:     Pt received semi-reclined in bed presenting to be in good spirits receptive to skilled OT session reporting 0/10 pain- OT offering intermittent rest breaks, repositioning, and therapeutic support to optimize participation in therapy session. Pt noted to be soiled in urine with Pt reporting he had split urinal on himself following continent void. Pt receptive to getting cleaned up during session. Focus this session EOB BADLs with emphasis on activity tolerance, functional cognition, and activity modification to increase overall independence. MD in/out during session for morning rounding and RN in/out to provide nursing care during session. Pt transitioned to EOB using bed features with HOB elevated min A + significantly increased amount of time. Doffed shirt in sitting supervision and Pt stood to doff pants and soiled brief with mod A +significantly increased amount of time with bed elevated. Pt presenting with extremely slowed motor movements during session requiring significant amount of time to power up to standing position during bathing/dressing tasks. Pt completed total of 4 sit<>stands during session from elevated bed using bed rail and RW to come to standing position. Pt tolerated standing >2 minutes x3 trials to allow for  bed linens to be changed and while OT was assisting with LB bathing with B UEs support on RW with CGA provided for balance. Pt able to maintain dynamic sitting balance with close supervision throughout session when reaching towards ground to wash lower B LEs and during LB dressing. Pt able to complete UB bathing min A to wash R UE and LB bathing mod A overall to wash buttocks, posterior portion of legs, and lower B LEs. Pt donned shirt with supervision, however significantly increased amount of time and effort required to complete task- Pt would benefit from addition education and practice using hemi-technique for energy conservation. Pt able to weave B LE's into pants by leaning forwards towards ground with assistance required to fully bring deet into pants d/t bilateral LE weakness and edema. Pt stood with mod A form elevated bed using RW and maintained standing balance CGA while OT assisted with bringing pants to waist. Pt completed 6 side steps to [facility] using RW with mod A +increased time with decreased foot clearance noted on L LE. Pt extremely fatigued at end of session. Returned to bed EOB>supine mod A to bring B LEs into bed with max verbal cues for technique. Pt was left resting in bed with call bell in reach, bed alarm on, and all needs met.    Therapy Documentation Precautions:  Precautions Precautions: Fall, Other (comment) Precaution Comments: hx of CVA Lt hemi Required Braces or Orthoses: Other Brace Other Brace: has an AFO for Lt foot but has not been wearing much lately, not at hospital Restrictions Weight Bearing Restrictions: No  Therapy/Group: Individual Therapy  Clide Deutscher 02/27/2023, 7:55 AM

## 2023-02-27 NOTE — Progress Notes (Signed)
PROGRESS NOTE   Subjective/Complaints:  Per OT LUE fxn appears similar to Feb 2024 Feels ok today   ROS- neg CP, SOB, N/V/D, + Back pain and Head pain on Left  Objective:   DG Knee Complete 4 Views Left  Result Date: 02/25/2023 CLINICAL DATA:  Pain EXAM: LEFT KNEE - COMPLETE 4+ VIEW COMPARISON:  None Available. FINDINGS: Mild degenerative changes with joint space narrowing and spurring. No acute bony abnormality. Specifically, no fracture, subluxation, or dislocation. No joint effusion. IMPRESSION: No acute bony abnormality. Electronically Signed   By: Charlett Nose M.D.   On: 02/25/2023 22:04   DG Lumbar Spine 2-3 Views  Result Date: 02/25/2023 CLINICAL DATA:  Low back pain EXAM: LUMBAR SPINE - 2 VIEW COMPARISON:  02/21/2023 CT FINDINGS: Five lumbar type vertebral bodies are well visualized. Vertebral body height is well maintained. No anterolisthesis is noted. Mild osteophytic changes are seen. No soft tissue abnormality is noted. IMPRESSION: Mild degenerative change without acute abnormality. Electronically Signed   By: Alcide Clever M.D.   On: 02/25/2023 22:01   Recent Labs    02/26/23 0608  WBC 7.5  HGB 12.4*  HCT 37.7*  PLT 171   Recent Labs    02/26/23 0608  NA 135  K 3.9  CL 99  CO2 31  GLUCOSE 252*  BUN 24*  CREATININE 1.00  CALCIUM 8.6*    Intake/Output Summary (Last 24 hours) at 02/27/2023 0929 Last data filed at 02/27/2023 0836 Gross per 24 hour  Intake 720 ml  Output 1150 ml  Net -430 ml        Physical Exam: Vital Signs Blood pressure 132/63, pulse (!) 58, temperature 97.8 F (36.6 C), resp. rate 17, weight 107.6 kg, SpO2 95%.   General: No acute distress Mood and affect are appropriate Heart: Regular rate and rhythm no rubs murmurs or extra sounds Lungs: Clear to auscultation, breathing unlabored, no rales or wheezes Abdomen: Positive bowel sounds, soft nontender to palpation,  nondistended Extremities: No clubbing, cyanosis, or edema Skin: No evidence of breakdown, no evidence of rash Neurologic: Cranial nerves II through XII intact, motor strength is 5/5 in RIght and 3- left  deltoid, bicep, tricep, grip, hip flexor, knee extensors, ankle dorsiflexor and plantar flexor  Musculoskeletal:Mild pain with R knee ROM  Assessment/Plan: 1. Functional deficits which require 3+ hours per day of interdisciplinary therapy in a comprehensive inpatient rehab setting. Physiatrist is providing close team supervision and 24 hour management of active medical problems listed below. Physiatrist and rehab team continue to assess barriers to discharge/monitor patient progress toward functional and medical goals  Care Tool:  Bathing    Body parts bathed by patient: Left arm, Abdomen, Chest, Front perineal area, Right upper leg, Left upper leg, Face   Body parts bathed by helper: Left lower leg, Right lower leg, Right arm, Left arm, Buttocks, Abdomen     Bathing assist Assist Level: Maximal Assistance - Patient 24 - 49%     Upper Body Dressing/Undressing Upper body dressing   What is the patient wearing?: Pull over shirt    Upper body assist Assist Level: Maximal Assistance - Patient 25 - 49%  Lower Body Dressing/Undressing Lower body dressing      What is the patient wearing?: Pants, Underwear/pull up     Lower body assist Assist for lower body dressing: Total Assistance - Patient < 25%     Toileting Toileting    Toileting assist Assist for toileting: Dependent - Patient 0% (condom catheter)     Transfers Chair/bed transfer  Transfers assist  Chair/bed transfer activity did not occur: Safety/medical concerns        Locomotion Ambulation   Ambulation assist   Ambulation activity did not occur: Safety/medical concerns (requires skilled assist and +2 w/c follow)          Walk 10 feet activity   Assist  Walk 10 feet activity did not occur:  Safety/medical concerns        Walk 50 feet activity   Assist Walk 50 feet with 2 turns activity did not occur: Safety/medical concerns         Walk 150 feet activity   Assist Walk 150 feet activity did not occur: Safety/medical concerns         Walk 10 feet on uneven surface  activity   Assist Walk 10 feet on uneven surfaces activity did not occur: Safety/medical concerns         Wheelchair     Assist Is the patient using a wheelchair?: Yes Type of Wheelchair: Manual (TIS w/c)    Wheelchair assist level: Dependent - Patient 0%      Wheelchair 50 feet with 2 turns activity    Assist        Assist Level: Dependent - Patient 0%   Wheelchair 150 feet activity     Assist      Assist Level: Dependent - Patient 0%   Blood pressure 132/63, pulse (!) 58, temperature 97.8 F (36.6 C), resp. rate 17, weight 107.6 kg, SpO2 95%.  Medical Problem List and Plan: 1. 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  Antithrombotics: -DVT/anticoagulation:  Pharmaceutical: Lovenox added per discussion with NS.              -antiplatelet therapy:  Hold ASA 3. Pain Management: back and left knee pain since fall             --Headaches worse but managed with tramadol 4. Mood/Behavior/Sleep: LCSW to follow for evaluation and support.              -antipsychotic agents: N/A 5. Neuropsych/cognition: This patient is capable of making decisions on his own behalf. 6. Skin/Wound Care: Routine pressure relief measures             --will add Vitamin C/Zincd 7. Fluids/Electrolytes/Nutrition: Monitor  I/O. Check CMET in am.     Latest Ref Rng & Units 02/26/2023    6:08 AM 02/24/2023    6:43 AM 02/22/2023    8:34 AM  BMP  Glucose 70 - 99 mg/dL 284  132  440   BUN 8 - 23 mg/dL 24  28  20    Creatinine 0.61 - 1.24 mg/dL 1.02  7.25  3.66   Sodium 135 - 145  mmol/L 135  139  143   Potassium 3.5 - 5.1 mmol/L 3.9  3.8  3.3   Chloride 98 - 111 mmol/L 99  102  107   CO2 22 - 32 mmol/L 31  30  26    Calcium 8.9 - 10.3 mg/dL 8.6  8.8  9.0     8. U7OZ poorly controlled: Hgb A1c-10.1--was 8.6 10/2022. Tends to miss insulin doses per reports             --was on Levemir 40 u pm, trulicity 3 mg/wk, Novolog TID, Farxiaga 10 mg             --monitor BS ac/hs and titrate Levemir to home dose.   CBG (last 3)  Recent Labs    02/26/23 1641 02/26/23 2046 02/27/23 0629  GLUCAP 247* 283* 216*  Increase Semglee to 15U   9. HTN: Monitor BP TID. Continue Coreg bid and hydrochlorothiazide Vitals:   02/27/23 0747 02/27/23 0752  BP: 132/63   Pulse: (!) 57 (!) 58  Resp:    Temp:    SpO2: 95%     10. Acute on chronic headaches: Worse now but tramadol prn effective --Continue Topamax 11.  ABLA/Pelvic hematoma: Monitor H/H serially. Recheck CBC in am    Latest Ref Rng & Units 02/26/2023    6:08 AM 02/24/2023    6:43 AM 02/22/2023    8:34 AM  CBC  WBC 4.0 - 10.5 K/uL 7.5  7.4  7.2   Hemoglobin 13.0 - 17.0 g/dL 36.6  44.0  34.7   Hematocrit 39.0 - 52.0 % 37.7  38.0  40.7   Platelets 150 - 400 K/uL 171  191  230     12. Lymphedema w/stasis ulcer LLE: Daily wound care with silver Hydrofiber for moisture control and antimicrobial effect.  13. OSA: Continue CPAP.  14.  GB wall thickening: No signs of cholecystitis 15. Anxiety/Depression: controlled on buspar and Cymbalta  (intergrated psychology)  16. Tinea pedis left: Continue Clotrimazole for local measures.  17. Constipation: Will start Miralax 34 g today followed by 17 grams daily.     LOS: 2 days A FACE TO FACE EVALUATION WAS PERFORMED  Erick Colace 02/27/2023, 9:29 AM

## 2023-02-27 NOTE — Progress Notes (Signed)
Speech Language Pathology Daily Session Note  Patient Details  Name: Joseph Ortega MRN: [id number] Date of Birth: February 19, 1956  Today's Date: 02/27/2023 SLP Individual Time: 0801-0900 SLP Individual Time Calculation (min): 59 min  Short Term Goals: Week 1: SLP Short Term Goal 1 (Week 1): Pt will selectively attend to structured and/or functional tasks in a mildly distracting environment for 10 minutes given min A SLP Short Term Goal 2 (Week 1): Pt will recall novel information given a 5-7 minute delay with 80% acc with min A SLP Short Term Goal 3 (Week 1): Pt will complete moderately complex problem solving with 80% acc with min A SLP Short Term Goal 4 (Week 1): Patient will identify two cognitive and two physical changes since admission given min multimodal A  Skilled Therapeutic Interventions:  Patient was seen in am to address cognitive re-training. Pt was alert and seated upright in bed upon SLP arrival. He endorsed 4 out of 10 pain however; was agreeable to participate in session. SLP introduced WRAP memory strategies and examples of utilization. Pt was subsequently challenged in paragraph retention. SLP guided pt in repetition and association strategies. Given a 30 minute distracted delay, pt recalled information with 100% acc indep. In additional minutes of session, SLP addressed attention through structured task. In a mildly distracting environment (TV kept playing at low volume), pt attended to task for up to 9 minutes with mod cues intially to recall directions which were able to be faded to sup to min A. In other minutes of session, SLP addressed problem solving. Given prescription labels presented verbally, SLP challenged pt in interpretation. Pt interpreted labels with 80% acc indep improving to 100% with min A. At conclusion of session, pt was left seated upright in bed with call button within reach and bed alarm active. SLP to continue POC.   Pain Pain Assessment Pain Scale:  0-10 Pain Score: 4  Pain Type: Acute pain Pain Location: Head Pain Orientation: Posterior Pain Descriptors / Indicators: Aching  Therapy/Group: Individual Therapy  Renaee Munda 02/27/2023, 8:56 AM

## 2023-02-27 NOTE — Progress Notes (Signed)
   02/27/23 1547  Spiritual Encounters  Type of Visit Follow up  Care provided to: Patient  Reason for visit Routine spiritual support  OnCall Visit No   Follow-up with patient spiritual care provided for in ED. Patient addressed concern about self and spouse Eunice Blase, and discharge expectation. Patient's spouse unable to make it to the hospital to visit patient due to mobility issues, therefore this chaplain will provide patient with another visit tomorrow and continue to provide companionship. Also discussed HCPOA, patient not interested at this time, says spouse takes care of everything for him.

## 2023-02-27 NOTE — Progress Notes (Addendum)
Patient ID: Joseph Ortega, male   DOB: 04-09-1955, 67 y.o.   MRN: [id number]  Team Conference Report to Patient/Family  Team Conference discussion was reviewed with the patient and caregiver, including goals, any changes in plan of care and target discharge date.  Patient and caregiver express understanding and are in agreement.  The patient has a target discharge date of 03/12/23.  Sw met with patient in the room to discuss discharge date. Patient pleased.   2:41 PM: SW spoke with pt's spouse to provide updates and discuss discharge date. Spouse please but requesting a discharge on 12/23 versus 12/24. SW will follow up with the team and then FU with spouse. No additional questions or concerns.   3:13 PM Spouse express that they have a shower chair. A handheld shower head was installed last night. Patient has a tall RW. Spouse would like clarification on what level the RW should be set on. Spouse also has a BSC but patient prefers the raised toilet or urinal at home.   Andria Rhein 02/27/2023, 2:40 PM

## 2023-02-27 NOTE — Plan of Care (Signed)
  Problem: RH BOWEL ELIMINATION Goal: RH STG MANAGE BOWEL WITH ASSISTANCE Description: STG Manage Bowel with mod I Assistance. Outcome: Not Progressing; LBM 12/5; patient has been refusing but took laxatives yesterday and today   Problem: RH BLADDER ELIMINATION Goal: RH STG MANAGE BLADDER WITH ASSISTANCE Description: STG Manage Bladder With mod I Assistance Outcome: Not Progressing; incontinence at times

## 2023-02-28 ENCOUNTER — Ambulatory Visit: Payer: Medicare HMO | Admitting: Physical Therapy

## 2023-02-28 DIAGNOSIS — F4323 Adjustment disorder with mixed anxiety and depressed mood: Secondary | ICD-10-CM | POA: Diagnosis not present

## 2023-02-28 DIAGNOSIS — S069X1D Unspecified intracranial injury with loss of consciousness of 30 minutes or less, subsequent encounter: Secondary | ICD-10-CM | POA: Diagnosis not present

## 2023-02-28 DIAGNOSIS — M961 Postlaminectomy syndrome, not elsewhere classified: Secondary | ICD-10-CM | POA: Diagnosis not present

## 2023-02-28 LAB — GLUCOSE, CAPILLARY
Glucose-Capillary: 205 mg/dL — ABNORMAL HIGH (ref 70–99)
Glucose-Capillary: 196 mg/dL — ABNORMAL HIGH (ref 70–99)
Glucose-Capillary: 212 mg/dL — ABNORMAL HIGH (ref 70–99)
Glucose-Capillary: 256 mg/dL — ABNORMAL HIGH (ref 70–99)

## 2023-02-28 LAB — BASIC METABOLIC PANEL
Anion gap: 5 (ref 5–15)
BUN: 22 mg/dL (ref 8–23)
CO2: 30 mmol/L (ref 22–32)
Calcium: 8.6 mg/dL — ABNORMAL LOW (ref 8.9–10.3)
Chloride: 103 mmol/L (ref 98–111)
Creatinine, Ser: 0.8 mg/dL (ref 0.61–1.24)
GFR, Estimated: >60 mL/min (ref >60)
Glucose, Bld: 207 mg/dL — ABNORMAL HIGH (ref 70–99)
Potassium: 3.5 mmol/L (ref 3.5–5.1)
Sodium: 138 mmol/L (ref 135–145)

## 2023-02-28 LAB — CBC
HCT: 36 % — ABNORMAL LOW (ref 39.0–52.0)
Hemoglobin: 12.3 g/dL — ABNORMAL LOW (ref 13.0–17.0)
MCH: 32.3 pg (ref 26.0–34.0)
MCHC: 34.2 g/dL (ref 30.0–36.0)
MCV: 94.5 fL (ref 80.0–100.0)
Platelets: 173 10*3/uL (ref 150–400)
RBC: 3.81 MIL/uL — ABNORMAL LOW (ref 4.22–5.81)
RDW: 14.3 % (ref 11.5–15.5)
WBC: 5.9 10*3/uL (ref 4.0–10.5)
nRBC: 0 % (ref 0.0–0.2)

## 2023-02-28 MED ORDER — INSULIN GLARGINE-YFGN 100 UNIT/ML ~~LOC~~ SOLN
20.0000 [IU] | Freq: Every day | SUBCUTANEOUS | Status: DC
  Administered 2023-02-28 – 2023-03-01 (×2): 20 [IU] via SUBCUTANEOUS
  Filled 2023-02-28 (×3): qty 0.2

## 2023-02-28 MED ORDER — INSULIN DETEMIR 100 UNIT/ML ~~LOC~~ SOLN: 20.0000 [IU] | Freq: Every day | SUBCUTANEOUS | Status: DC

## 2023-02-28 MED ORDER — SORBITOL 70 % SOLN
30.0000 mL | Freq: Every day | Status: DC | PRN
  Administered 2023-02-28: 30 mL via ORAL
  Filled 2023-02-28: qty 30

## 2023-02-28 NOTE — Progress Notes (Signed)
Physical Therapy Session Note  Patient Details  Name: Joseph Ortega MRN: [id number] Date of Birth: 08-31-1955  Today's Date: 02/28/2023 PT Individual Time: 1109-1206 PT Individual Time Calculation (min): 57 min   Short Term Goals: Week 1:  PT Short Term Goal 1 (Week 1): Pt will perform supine<>sit using bed features as needed with min A PT Short Term Goal 2 (Week 1): Pt will perform sit<>stands using LRAD with min A PT Short Term Goal 3 (Week 1): Pt will perform bed<>chair transfers using LRAD with min A PT Short Term Goal 4 (Week 1): Pt will ambulate at least 47ft using LRAD with min A PT Short Term Goal 5 (Week 1): Pt will initiate stair navigation training  Skilled Therapeutic Interventions/Progress Updates:    Pt received sitting tilted back in TIS w/c awake and agreeable to therapy session. Therapist discussed shoe options for pt to allow him to use his AFO because pt reports not wearing it due to swelling in L LE and his foot making it difficult to impossible for him to get his brace and shoe on independently.    Transported to/from gym in w/c for time management and energy conservation.  Sit>stands TIS w/c or EOM to RW with heavy min A for lifting and continued cuing for increased anterior weight shift while rising - pt continues to be very slow to rise requiring significantly increased time - pt able to place L hand on RW using R UE to assist and bring L foot back underneath BOS using R foot to assist.   Pt slow to move partially due to L mid-back pain.  Gait training 59ft + 23ft (progressing to 2 turns through doorways) using RW with skilled min assist for balance and +2 w/c follow (progressed to without it on 2nd trial). Pt demonstrating the following gait deviations with therapist providing the described cuing and facilitation for improvement:  - continues to have excessive forward trunk flexion with bias towards L side - L hip posteriorly rotated  - poor L LE foot clearance  with L LE excessively externally rotated  - severely decreased gait speed  - up to ~3x L knee slightly giving way during gait but able to recover without increased assist - therapist cuing throughout for increased glute activation to improve upright posture  Pt quickly fatigues during session requiring seated rest breaks to recover.   R stand pivot w/c>EOB using RW with heavier min A this time due to poor anterior weight shift. Nursing staff present to perform orthostatic vital check.  Sit>supine with mod A for B LE management onto bed and pt using bedrial for trunk support. Pt left supine in bed in care of nurse.  Therapy Documentation Precautions:  Precautions Precautions: Fall, Other (comment) Precaution Comments: hx of CVA Lt hemi Required Braces or Orthoses: Other Brace Other Brace: has an AFO for Lt foot but has not been wearing much lately, not at hospital Restrictions Weight Bearing Restrictions Per Provider Order: No   Pain:  Pt also limited in activity tolerance partially due to mid back pain on L side - uses heating pad in room and tylenol for pain management (reports he doesn't want to take the strong pain medication due to it making him feel foggy and less able to participate in therapy).    Therapy/Group: Individual Therapy  Ginny Forth , PT, DPT, NCS, CSRS 02/28/2023, 8:04 AM

## 2023-02-28 NOTE — Progress Notes (Signed)
Occupational Therapy Session Note  Patient Details  Name: Joseph Ortega MRN: [id number] Date of Birth: 1956/01/01  Today's Date: 02/28/2023 OT Missed Time: 60 Minutes Missed Time Reason: Patient fatigue;Pain   Short Term Goals: Week 1:  OT Short Term Goal 1 (Week 1): Pt will complete sit<>stands udring BADLs using LRAD with min A x1 OT Short Term Goal 2 (Week 1): Pt will complete stand pivot transfers for toileting and showers min A x1 OT Short Term Goal 3 (Week 1): Pt will tolerate standing at this sink during BADLs 2 minutes using LRAD OT Short Term Goal 4 (Week 1): Pt will complete UB dressing supervision  Skilled Therapeutic Interventions/Progress Updates:     Attempted to see Pt for PM scheduled OT session. Pt in bed sleeping upon OT arrival, waking to OT calling out his name. Pt reporting he is too fatigued to participate in therapy session and that it would be more beneficial to rest. Pt also reporting pain in back and L knee, requesting not to take pain medications at this time. Missed 60 minutes skilled OT treatment, will attempt to make up time as schedule and Pt's status allows.   Therapy Documentation Precautions:  Precautions Precautions: Fall, Other (comment) Precaution Comments: hx of CVA Lt hemi Required Braces or Orthoses: Other Brace Other Brace: has an AFO for Lt foot but has not been wearing much lately, not at hospital Restrictions Weight Bearing Restrictions Per Provider Order: No  Therapy/Group: Individual Therapy  Clide Deutscher 02/28/2023, 11:25 AM

## 2023-02-28 NOTE — Progress Notes (Signed)
PROGRESS NOTE   Subjective/Complaints:  Aware of d/c date , chronic LLE edema   ROS- neg CP, SOB, N/V/D, + Back pain and Head pain on Left  Objective:   No results found. Recent Labs    02/26/23 0608 02/28/23 0623  WBC 7.5 5.9  HGB 12.4* 12.3*  HCT 37.7* 36.0*  PLT 171 173   Recent Labs    02/26/23 0608 02/28/23 0623  NA 135 138  K 3.9 3.5  CL 99 103  CO2 31 30  GLUCOSE 252* 207*  BUN 24* 22  CREATININE 1.00 0.80  CALCIUM 8.6* 8.6*    Intake/Output Summary (Last 24 hours) at 02/28/2023 0811 Last data filed at 02/28/2023 0809 Gross per 24 hour  Intake 720 ml  Output 2325 ml  Net -1605 ml        Physical Exam: Vital Signs Blood pressure (!) 142/70, pulse 61, temperature 98 F (36.7 C), resp. rate 17, weight 107.3 kg, SpO2 95%.   General: No acute distress Mood and affect are appropriate Heart: Regular rate and rhythm no rubs murmurs or extra sounds Lungs: Clear to auscultation, breathing unlabored, no rales or wheezes Abdomen: Positive bowel sounds, soft nontender to palpation, nondistended Extremities: No clubbing, cyanosis, or edema Skin: No evidence of breakdown, no evidence of rash Neurologic: Cranial nerves II through XII intact, motor strength is 5/5 in RIght and 3- left  deltoid, bicep, tricep, grip, hip flexor, knee extensors, ankle dorsiflexor and plantar flexor  Musculoskeletal:Mild pain with R knee ROM  Assessment/Plan: 1. Functional deficits which require 3+ hours per day of interdisciplinary therapy in a comprehensive inpatient rehab setting. Physiatrist is providing close team supervision and 24 hour management of active medical problems listed below. Physiatrist and rehab team continue to assess barriers to discharge/monitor patient progress toward functional and medical goals  Care Tool:  Bathing    Body parts bathed by patient: Left arm, Abdomen, Chest, Front perineal area,  Right upper leg, Left upper leg, Face   Body parts bathed by helper: Left lower leg, Right lower leg, Right arm, Left arm, Buttocks, Abdomen     Bathing assist Assist Level: Maximal Assistance - Patient 24 - 49%     Upper Body Dressing/Undressing Upper body dressing   What is the patient wearing?: Pull over shirt    Upper body assist Assist Level: Maximal Assistance - Patient 25 - 49%    Lower Body Dressing/Undressing Lower body dressing      What is the patient wearing?: Pants, Underwear/pull up     Lower body assist Assist for lower body dressing: Total Assistance - Patient < 25%     Toileting Toileting    Toileting assist Assist for toileting: Dependent - Patient 0% (condom catheter)     Transfers Chair/bed transfer  Transfers assist  Chair/bed transfer activity did not occur: Safety/medical concerns  Chair/bed transfer assist level: Moderate Assistance - Patient 50 - 74% Chair/bed transfer assistive device: Walker, Archivist   Ambulation assist   Ambulation activity did not occur: Safety/medical concerns (requires skilled assist and +2 w/c follow)  Assist level: 2 helpers (min A and +2 w/c follow) Assistive device: Walker-rolling  Max distance: 4ft   Walk 10 feet activity   Assist  Walk 10 feet activity did not occur: Safety/medical concerns        Walk 50 feet activity   Assist Walk 50 feet with 2 turns activity did not occur: Safety/medical concerns         Walk 150 feet activity   Assist Walk 150 feet activity did not occur: Safety/medical concerns         Walk 10 feet on uneven surface  activity   Assist Walk 10 feet on uneven surfaces activity did not occur: Safety/medical concerns         Wheelchair     Assist Is the patient using a wheelchair?: Yes Type of Wheelchair: Manual (TIS w/c)    Wheelchair assist level: Dependent - Patient 0%      Wheelchair 50 feet with 2 turns  activity    Assist        Assist Level: Dependent - Patient 0%   Wheelchair 150 feet activity     Assist      Assist Level: Dependent - Patient 0%   Blood pressure (!) 142/70, pulse 61, temperature 98 F (36.7 C), resp. rate 17, weight 107.3 kg, SpO2 95%.  Medical Problem List and Plan: 1. 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  Antithrombotics: -DVT/anticoagulation:  Pharmaceutical: Lovenox added per discussion with NS.              -antiplatelet therapy:  Hold ASA 3. Pain Management: back and left knee pain since fall             --Headaches worse but managed with tramadol 4. Mood/Behavior/Sleep: LCSW to follow for evaluation and support.              -antipsychotic agents: N/A 5. Neuropsych/cognition: This patient is capable of making decisions on his own behalf. 6. Skin/Wound Care: Routine pressure relief measures             --will add Vitamin C/Zincd 7. Fluids/Electrolytes/Nutrition: Monitor  I/O. Check CMET in am.     Latest Ref Rng & Units 02/28/2023    6:23 AM 02/26/2023    6:08 AM 02/24/2023    6:43 AM  BMP  Glucose 70 - 99 mg/dL 119  147  829   BUN 8 - 23 mg/dL 22  24  28    Creatinine 0.61 - 1.24 mg/dL 5.62  1.30  8.65   Sodium 135 - 145 mmol/L 138  135  139   Potassium 3.5 - 5.1 mmol/L 3.5  3.9  3.8   Chloride 98 - 111 mmol/L 103  99  102   CO2 22 - 32 mmol/L 30  31  30    Calcium 8.9 - 10.3 mg/dL 8.6  8.6  8.8     8. T2DM poorly controlled: Hgb A1c-10.1--was 8.6 10/2022. Tends to miss insulin doses per reports             --was on Levemir 40 u pm, trulicity 3 mg/wk, Novolog TID, Farxiaga 10 mg             --monitor BS ac/hs and titrate Levemir to home dose.   CBG (last 3)  Recent Labs    02/27/23 1634 02/27/23 2215 02/28/23 0600  GLUCAP  271* 262* 205*  Increase Semglee to 20U   9. HTN: Monitor BP TID. Continue Coreg bid and  hydrochlorothiazide Vitals:   02/27/23 2352 02/28/23 0452  BP:  (!) 142/70  Pulse: (!) 59 61  Resp: 16 17  Temp:  98 F (36.7 C)  SpO2: 98% 95%   May increase hydrochlorothiazide if BP increases 10. Acute on chronic headaches: Worse now but tramadol prn effective --Continue Topamax 11.  ABLA/Pelvic hematoma: Monitor H/H serially. Recheck CBC in am    Latest Ref Rng & Units 02/28/2023    6:23 AM 02/26/2023    6:08 AM 02/24/2023    6:43 AM  CBC  WBC 4.0 - 10.5 K/uL 5.9  7.5  7.4   Hemoglobin 13.0 - 17.0 g/dL 54.0  98.1  19.1   Hematocrit 39.0 - 52.0 % 36.0  37.7  38.0   Platelets 150 - 400 K/uL 173  171  191     12. Lymphedema w/stasis ulcer LLE: Daily wound care with silver Hydrofiber for moisture control and antimicrobial effect.  13. OSA: Continue CPAP.  14.  GB wall thickening: No signs of cholecystitis 15. Anxiety/Depression: controlled on buspar and Cymbalta  (intergrated psychology)  16. Tinea pedis left: Continue Clotrimazole for local measures.  17. Constipation: Will start Miralax 34 g today followed by 17 grams daily.     LOS: 3 days A FACE TO FACE EVALUATION WAS PERFORMED  Erick Colace 02/28/2023, 8:11 AM

## 2023-02-28 NOTE — IPOC Note (Signed)
Overall Plan of Care [facility]) Patient Details Name: Joseph Ortega MRN: [id number] DOB: Aug 13, 1955  Admitting Diagnosis: TBI (traumatic brain injury) [facility])  Hospital Problems: Principal Problem:   TBI (traumatic brain injury) (HCC) Active Problems:   Stroke (cerebrum) (HCC)     Functional Problem List: Nursing Bladder, Pain, Bowel, Safety, Endurance, Medication Management  PT Balance, Perception, Safety, Sensory, Edema, Skin Integrity, Endurance, Motor, Nutrition, Pain  OT Balance, Perception, Sensory, Cognition, Safety, Edema, Skin Integrity, Endurance, Vision, Motor, Pain  SLP Cognition  TR         Basic ADL's: OT Bathing, Dressing, Toileting, Grooming     Advanced  ADL's: OT Light Housekeeping     Transfers: PT Bed Mobility, Bed to Chair, Set designer, Occupational psychologist, Research scientist (life sciences): PT Ambulation, Psychologist, prison and probation services, Stairs     Additional Impairments: OT    SLP Social Cognition   Problem Solving, Memory, Attention, Awareness  TR      Anticipated Outcomes Item Anticipated Outcome  Self Feeding Independent  Swallowing      Basic self-care  Mod I; min A for bathing  Toileting  Mod I   Bathroom Transfers Mod I  Bowel/Bladder  manage bowel w mod I and bladder w mod I  Transfers  mod-I using LRAD (must reach mod-I given pt's wife unable to provide physical assistance)  Locomotion  mod-I using LRAD for household mobility (must reach mod-I given pt's wife unable to provide physical assistance)  Communication     Cognition  supervision  Pain  < 4 with prns  Safety/Judgment  manage w cues   Therapy Plan: PT Intensity: Minimum of 1-2 x/day ,45 to 90 minutes PT Frequency: 5 out of 7 days PT Duration Estimated Length of Stay: ~2-2.5 weeks OT Intensity: Minimum of 1-2 x/day, 45 to 90 minutes OT Frequency: 5 out of 7 days OT Duration/Estimated Length of Stay: 2 weeks SLP Intensity: Minumum of 1-2 x/day, 30 to 90 minutes SLP Frequency: 1 to 3 out  of 7 days SLP Duration/Estimated Length of Stay: 2- 2.5 weeks   Team Interventions: Nursing Interventions Bladder Management, Pain Management, Bowel Management, Medication Management, Discharge Planning, Disease Management/Prevention, Patient/Family Education  PT interventions Ambulation/gait training, Community reintegration, DME/adaptive equipment instruction, Neuromuscular re-education, Psychosocial support, Museum/gallery curator, UE/LE Strength taining/ROM, Wheelchair propulsion/positioning, Warden/ranger, Discharge planning, Functional electrical stimulation, Pain management, Skin care/wound management, Therapeutic Activities, UE/LE Coordination activities, Cognitive remediation/compensation, Disease management/prevention, Functional mobility training, Patient/family education, Splinting/orthotics, Therapeutic Exercise, Visual/perceptual remediation/compensation  OT Interventions Balance/vestibular training, Discharge planning, Functional electrical stimulation, Pain management, Self Care/advanced ADL retraining, Therapeutic Activities, UE/LE Coordination activities, Cognitive remediation/compensation, Disease mangement/prevention, Functional mobility training, Patient/family education, Skin care/wound managment, Therapeutic Exercise, Visual/perceptual remediation/compensation, Community reintegration, Fish farm manager, Neuromuscular re-education, Psychosocial support, Splinting/orthotics, UE/LE Strength taining/ROM  SLP Interventions Cognitive remediation/compensation, Internal/external aids, Financial trader, Environmental controls, Functional tasks, Patient/family education  TR Interventions    SW/CM Interventions Discharge Planning, Patient/Family Education, Psychosocial Support, Disease Management/Prevention   Barriers to Discharge MD  Medical stability and Wound care  Nursing Home environment access/layout, Decreased caregiver support ramped entry + 2ste w grad bar w  spouse who can provide supervision;  wheelchair /transport chair for mobility in the home  PT Decreased caregiver support, Home environment access/layout, Lack of/limited family support pt's wife unable to provide any physical assistance  OT      SLP      SW Lack of/limited family support, Decreased caregiver support spouse WC level able to provide supervision  only.   Team Discharge Planning: Destination: PT-Home ,OT- Home , SLP-Home Projected Follow-up: PT-Home health PT, 24 hour supervision/assistance, OT-  Outpatient OT, SLP-None Projected Equipment Needs: PT-To be determined, OT- To be determined, SLP-None recommended by SLP Equipment Details: PT- , OT-Pt owns [facility] and Shower chair Patient/family involved in discharge planning: PT- Patient,  OT-Patient, SLP-Patient  MD ELOS: 14-16d Medical Rehab Prognosis:  Good Assessment: The patient has been admitted for CIR therapies with the diagnosis of TBI. The team will be addressing functional mobility, strength, stamina, balance, safety, adaptive techniques and equipment, self-care, bowel and bladder mgt, patient and caregiver education, LUE and LLE edema control. Goals have been set at Sup/Mod I. Anticipated discharge destination is Home .        See Team Conference Notes for weekly updates to the plan of care

## 2023-02-28 NOTE — Progress Notes (Signed)
Patient ID: Joseph Ortega, male   DOB: 13-Mar-1956, 67 y.o.   MRN: [id number]   Physician has confirmed discharge for 03/11/23.

## 2023-02-28 NOTE — Progress Notes (Signed)
Occupational Therapy Session Note  Patient Details  Name: Joseph Ortega MRN: [id number] Date of Birth: 12-Dec-1955  Today's Date: 02/28/2023 OT Individual Time: 1478-2956 OT Individual Time Calculation (min): 70 min    Short Term Goals: Week 1:  OT Short Term Goal 1 (Week 1): Pt will complete sit<>stands udring BADLs using LRAD with min A x1 OT Short Term Goal 2 (Week 1): Pt will complete stand pivot transfers for toileting and showers min A x1 OT Short Term Goal 3 (Week 1): Pt will tolerate standing at this sink during BADLs 2 minutes using LRAD OT Short Term Goal 4 (Week 1): Pt will complete UB dressing supervision  Skilled Therapeutic Interventions/Progress Updates:     Pt received semi-reclined in bed presenting to be in good spirits receptive to skilled OT session reporting 3/10 pain in his back- OT offering intermittent rest breaks, repositioning, and therapeutic support to optimize participation in therapy session. Focus this session BADL retraining with emphasis on modified techniques, energy conservation, and functional mobility training.   Pt transitioned to EOB with HOB elevated using bed features min A +increased time and min verbal cues for technique. Pt wearing gown and clean brief upon OT arrival, politely declining shower this session and requesting to get changed into clean clothes. During previous session, Pt noted to be able to complete U/LB dressing with min-mod A, however dressing tasks required maximal effort and energy. Educated Pt on hemi-techniques for UB dressing and hemi-technique for LB dressing with use of reacher with demonstration provided and opportunity to practice following. Pt able to donn shirt with light min A to adjust shirt over R elbow and max verbal cues for technique and problem solving d/t new learning experience. Pt able to utilize reacher to weave B LEs into pants with max verbal cues provided for technique +increased time. Pt stood from EOB with bed  elevated with min A +increased time as Pt is extremely slow to rise to standing position giving maximal effort. OT assisted Pt in bringing pants to waist as Pt requires B UE support on RW for balance at this time.   Upon standing, Pt reporting need for BM (Pt has not had BM in 4+ days per Pt report). Engaged Pt in completing functional mobility to BR for endurance training and to increase Pt's independence in functional mobility for d/c back home. Pt required significantly increased amount of time and mod A to fully step through L LE using RW to ambulate to BR. Mod verbal and tactile cues provided for weight shifting and L LE placement. Single standing rest break provided during functional mobility with Pt able to complete ~20 ft. Pt pivoted to toilet and utilized grab bar to sit on toilet with mod A required for controled decent.   Increased time provided on toilet d/t need for BM. Increased time provided on toilet, however No BM at this time. Pt required max A to stand from toilet using grab bars and L UE supported on RW. OT assisted with bringing pants to waist while Pt maintained balance with CGA.   Pt presenting to be fatigued following BADLs, however requesting to walk back to room. Pt completed ~10 ft of functional mobility to [facility] with heavy mod A required for balance, RW management, and L foot placement + increased time. OT also blocking L knee during stance phase and facilitating lateral weight shifting during step phase.   Pt was left resting in TISWC with call bell in reach, seatbelt alarm on, and all  needs met.    Therapy Documentation Precautions:  Precautions Precautions: Fall, Other (comment) Precaution Comments: hx of CVA Lt hemi Required Braces or Orthoses: Other Brace Other Brace: has an AFO for Lt foot but has not been wearing much lately, not at hospital Restrictions Weight Bearing Restrictions Per Provider Order: No   Therapy/Group: Individual Therapy  Clide Deutscher 02/28/2023, 8:03 AM

## 2023-03-01 ENCOUNTER — Ambulatory Visit: Payer: Medicare HMO | Admitting: Student

## 2023-03-01 DIAGNOSIS — K5901 Slow transit constipation: Secondary | ICD-10-CM | POA: Diagnosis not present

## 2023-03-01 DIAGNOSIS — F4323 Adjustment disorder with mixed anxiety and depressed mood: Secondary | ICD-10-CM | POA: Diagnosis not present

## 2023-03-01 DIAGNOSIS — S069X1D Unspecified intracranial injury with loss of consciousness of 30 minutes or less, subsequent encounter: Secondary | ICD-10-CM | POA: Diagnosis not present

## 2023-03-01 DIAGNOSIS — M961 Postlaminectomy syndrome, not elsewhere classified: Secondary | ICD-10-CM | POA: Diagnosis not present

## 2023-03-01 LAB — GLUCOSE, CAPILLARY
Glucose-Capillary: 165 mg/dL — ABNORMAL HIGH (ref 70–99)
Glucose-Capillary: 256 mg/dL — ABNORMAL HIGH (ref 70–99)
Glucose-Capillary: 249 mg/dL — ABNORMAL HIGH (ref 70–99)
Glucose-Capillary: 268 mg/dL — ABNORMAL HIGH (ref 70–99)

## 2023-03-01 MED ORDER — SENNOSIDES-DOCUSATE SODIUM 8.6-50 MG PO TABS
2.0000 | ORAL_TABLET | Freq: Two times a day (BID) | ORAL | Status: DC
Start: 2023-03-01 — End: 2023-03-07
  Administered 2023-03-01 – 2023-03-04 (×6): 2 via ORAL
  Filled 2023-03-01 (×12): qty 2

## 2023-03-01 NOTE — Progress Notes (Signed)
Physical Therapy Session Note  Patient Details  Name: Joseph Ortega MRN: [id number] Date of Birth: 01/24/1956  Today's Date: 03/01/2023 PT Individual Time: 0900-1000 PT Individual Time Calculation (min): 60 min   Short Term Goals: Week 1:  PT Short Term Goal 1 (Week 1): Pt will perform supine<>sit using bed features as needed with min A PT Short Term Goal 2 (Week 1): Pt will perform sit<>stands using LRAD with min A PT Short Term Goal 3 (Week 1): Pt will perform bed<>chair transfers using LRAD with min A PT Short Term Goal 4 (Week 1): Pt will ambulate at least 46ft using LRAD with min A PT Short Term Goal 5 (Week 1): Pt will initiate stair navigation training  Skilled Therapeutic Interventions/Progress Updates: Pt presents sitting in TIS reclined back, finishing w/ OT.  Pt agreeable to therapy.  Pt wheeled to main gym for time conservation.  Pt transfers sit to stand w/ min A only, but slowly rising after RLE assisting LLE into position.  Pt amb multiple trials w/ RW and min A x 73' including turns to return to w/c.  Pt amb w/ ER LLE, but does correct slightly w/ cues.  Pt w/ L foot clearance x 40% of time.  Pt requires seated rest breaks between trials 2/2 fatigue.  Coban applied to L hand rest w/ pt approving.  Pt returned to room and remained sitting in TIS, reclined back w/ chair alarm on and all needs in reach     Therapy Documentation Precautions:  Precautions Precautions: Fall, Other (comment) Precaution Comments: hx of CVA Lt hemi Required Braces or Orthoses: Other Brace Other Brace: has an AFO for Lt foot but has not been wearing much lately, not at hospital Restrictions Weight Bearing Restrictions Per Provider Order: No General:   Vital Signs:   Pain:3/10 to neck and posterior head.       Therapy/Group: Individual Therapy  [name] 03/01/2023, 10:00 AM

## 2023-03-01 NOTE — Progress Notes (Signed)
Speech Language Pathology Daily Session Note  Patient Details  Name: Joseph Ortega MRN: [id number] Date of Birth: 12-Dec-1955  Today's Date: 03/01/2023 SLP Individual Time: 1045-1130 SLP Individual Time Calculation (min): 45 min  Short Term Goals: Week 1: SLP Short Term Goal 1 (Week 1): Pt will selectively attend to structured and/or functional tasks in a mildly distracting environment for 10 minutes given min A SLP Short Term Goal 2 (Week 1): Pt will recall novel information given a 5-7 minute delay with 80% acc with min A SLP Short Term Goal 3 (Week 1): Pt will complete moderately complex problem solving with 80% acc with min A SLP Short Term Goal 4 (Week 1): Patient will identify two cognitive and two physical changes since admission given min multimodal A  Skilled Therapeutic Interventions: Skilled therapy session focused on cognitive goals. SLP faciliated session by providing modA during medication mistakes activity. Patient was prompted to identify medication mistakes in BID pillbox according to written instructions. Patient benefited from reviewing one pill at a time to check for accuracy and extra processing time. At the end of session, SLP educated patient on WRAP (write, repeat, associate, picture) memory strategies. Patient oriented to self, situation, place and time independently. Patient left in chair with alarm set and call bell in reach. Continue POC.  Pain None reported   Therapy/Group: Individual Therapy  Bridie Colquhoun M.A., CF-SLP 03/01/2023, 7:32 AM

## 2023-03-01 NOTE — Progress Notes (Signed)
PROGRESS NOTE   Subjective/Complaints:  No BM since rehab admission   ROS- neg CP, SOB, N/V/D, + Back pain and Head pain on Left  Objective:   No results found. Recent Labs    02/28/23 0623  WBC 5.9  HGB 12.3*  HCT 36.0*  PLT 173   Recent Labs    02/28/23 0623  NA 138  K 3.5  CL 103  CO2 30  GLUCOSE 207*  BUN 22  CREATININE 0.80  CALCIUM 8.6*    Intake/Output Summary (Last 24 hours) at 03/01/2023 0824 Last data filed at 03/01/2023 0730 Gross per 24 hour  Intake 716 ml  Output 1700 ml  Net -984 ml        Physical Exam: Vital Signs Blood pressure (!) 147/71, pulse 61, temperature 98 F (36.7 C), temperature source Oral, resp. rate 18, weight 107.1 kg, SpO2 95%.   General: No acute distress Mood and affect are appropriate Heart: Regular rate and rhythm no rubs murmurs or extra sounds Lungs: Clear to auscultation, breathing unlabored, no rales or wheezes Abdomen: Positive bowel sounds, soft nontender to palpation, nondistended Extremities: No clubbing, cyanosis, or edema Skin: No evidence of breakdown, no evidence of rash Neurologic: Cranial nerves II through XII intact, motor strength is 5/5 in RIght and 3- left  deltoid, bicep, tricep, grip, hip flexor, knee extensors, ankle dorsiflexor and plantar flexor  Musculoskeletal:Mild pain with R knee ROM  Assessment/Plan: 1. Functional deficits which require 3+ hours per day of interdisciplinary therapy in a comprehensive inpatient rehab setting. Physiatrist is providing close team supervision and 24 hour management of active medical problems listed below. Physiatrist and rehab team continue to assess barriers to discharge/monitor patient progress toward functional and medical goals  Care Tool:  Bathing    Body parts bathed by patient: Left arm, Abdomen, Chest, Front perineal area, Right upper leg, Left upper leg, Face   Body parts bathed by helper:  Left lower leg, Right lower leg, Right arm, Left arm, Buttocks, Abdomen     Bathing assist Assist Level: Maximal Assistance - Patient 24 - 49%     Upper Body Dressing/Undressing Upper body dressing   What is the patient wearing?: Pull over shirt    Upper body assist Assist Level: Maximal Assistance - Patient 25 - 49%    Lower Body Dressing/Undressing Lower body dressing      What is the patient wearing?: Pants, Underwear/pull up     Lower body assist Assist for lower body dressing: Total Assistance - Patient < 25%     Toileting Toileting    Toileting assist Assist for toileting: Dependent - Patient 0% (condom catheter)     Transfers Chair/bed transfer  Transfers assist  Chair/bed transfer activity did not occur: Safety/medical concerns  Chair/bed transfer assist level: Moderate Assistance - Patient 50 - 74% Chair/bed transfer assistive device: Walker, Archivist   Ambulation assist   Ambulation activity did not occur: Safety/medical concerns (requires skilled assist and +2 w/c follow)  Assist level: 2 helpers (min A and +2 w/c follow) Assistive device: Walker-rolling Max distance: 73ft   Walk 10 feet activity   Assist  Walk 10 feet  activity did not occur: Safety/medical concerns        Walk 50 feet activity   Assist Walk 50 feet with 2 turns activity did not occur: Safety/medical concerns         Walk 150 feet activity   Assist Walk 150 feet activity did not occur: Safety/medical concerns         Walk 10 feet on uneven surface  activity   Assist Walk 10 feet on uneven surfaces activity did not occur: Safety/medical concerns         Wheelchair     Assist Is the patient using a wheelchair?: Yes Type of Wheelchair: Manual (TIS w/c)    Wheelchair assist level: Dependent - Patient 0%      Wheelchair 50 feet with 2 turns activity    Assist        Assist Level: Dependent - Patient 0%    Wheelchair 150 feet activity     Assist      Assist Level: Dependent - Patient 0%   Blood pressure (!) 147/71, pulse 61, temperature 98 F (36.7 C), temperature source Oral, resp. rate 18, weight 107.1 kg, SpO2 95%.  Medical Problem List and Plan: 1. 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  Antithrombotics: -DVT/anticoagulation:  Pharmaceutical: Lovenox added per discussion with NS.              -antiplatelet therapy:  Hold ASA 3. Pain Management: back and left knee pain since fall             --Headaches worse but managed with tramadol 4. Mood/Behavior/Sleep: LCSW to follow for evaluation and support.              -antipsychotic agents: N/A 5. Neuropsych/cognition: This patient is capable of making decisions on his own behalf. 6. Skin/Wound Care: Routine pressure relief measures             --will add Vitamin C/Zincd 7. Fluids/Electrolytes/Nutrition: Monitor  I/O. Check CMET in am.     Latest Ref Rng & Units 02/28/2023    6:23 AM 02/26/2023    6:08 AM 02/24/2023    6:43 AM  BMP  Glucose 70 - 99 mg/dL 161  096  045   BUN 8 - 23 mg/dL 22  24  28    Creatinine 0.61 - 1.24 mg/dL 4.09  8.11  9.14   Sodium 135 - 145 mmol/L 138  135  139   Potassium 3.5 - 5.1 mmol/L 3.5  3.9  3.8   Chloride 98 - 111 mmol/L 103  99  102   CO2 22 - 32 mmol/L 30  31  30    Calcium 8.9 - 10.3 mg/dL 8.6  8.6  8.8     8. T2DM poorly controlled: Hgb A1c-10.1--was 8.6 10/2022. Tends to miss insulin doses per reports             --was on Levemir 40 u pm, trulicity 3 mg/wk, Novolog TID, Farxiaga 10 mg             --monitor BS ac/hs and titrate Levemir to home dose.   CBG (last 3)  Recent Labs    02/28/23 1640 02/28/23 2055 03/01/23 0606  GLUCAP 212* 256* 165*  Increase Semglee to 20U   9. HTN: Monitor  BP TID. Continue Coreg bid and hydrochlorothiazide Vitals:   02/28/23 1948  03/01/23 0542  BP: (!) 145/73 (!) 147/71  Pulse: 63 61  Resp: 17 18  Temp: 98.2 F (36.8 C) 98 F (36.7 C)  SpO2: 95% 95%   May increase hydrochlorothiazide if BP increases 10. Acute on chronic headaches: Worse now but tramadol prn effective --Continue Topamax 11.  ABLA/Pelvic hematoma: Monitor H/H serially. Recheck CBC in am    Latest Ref Rng & Units 02/28/2023    6:23 AM 02/26/2023    6:08 AM 02/24/2023    6:43 AM  CBC  WBC 4.0 - 10.5 K/uL 5.9  7.5  7.4   Hemoglobin 13.0 - 17.0 g/dL 40.9  81.1  91.4   Hematocrit 39.0 - 52.0 % 36.0  37.7  38.0   Platelets 150 - 400 K/uL 173  171  191     12. Lymphedema w/stasis ulcer LLE: Daily wound care with silver Hydrofiber for moisture control and antimicrobial effect.  13. OSA: Continue CPAP.  14.  GB wall thickening: No signs of cholecystitis 15. Anxiety/Depression: controlled on buspar and Cymbalta  (intergrated psychology)  16. Tinea pedis left: Continue Clotrimazole for local measures.  17. Constipation: Will start Miralax 34 g today followed by 17 grams daily. Pt states this is not as good as pericolace which he takes at home will increase senna S     LOS: 4 days A FACE TO FACE EVALUATION WAS PERFORMED  Erick Colace 03/01/2023, 8:24 AM

## 2023-03-01 NOTE — Progress Notes (Signed)
   03/01/23 0020  BiPAP/CPAP/SIPAP  $ Non-Invasive Home Ventilator  Subsequent  BiPAP/CPAP/SIPAP Pt Type Adult  BiPAP/CPAP/SIPAP DREAMSTATIOND  Mask Type Full face mask  Mask Size Medium  Respiratory Rate 18 breaths/min  IPAP 18 cmH20  EPAP 4 cmH2O  FiO2 (%) 21 %  Patient Home Equipment No  CPAP/SIPAP surface wiped down Yes

## 2023-03-01 NOTE — Progress Notes (Signed)
Occupational Therapy Session Note  Patient Details  Name: Joseph Ortega MRN: [id number] Date of Birth: Jun 08, 1955  Today's Date: 03/01/2023 OT Individual Time: 0825-0900 OT Individual Time Calculation (min): 35 min  OT Individual Time: 1135-1200 OT Individual Time Calculation (min): 25 min   Short Term Goals: Week 1:  OT Short Term Goal 1 (Week 1): Pt will complete sit<>stands udring BADLs using LRAD with min A x1 OT Short Term Goal 2 (Week 1): Pt will complete stand pivot transfers for toileting and showers min A x1 OT Short Term Goal 3 (Week 1): Pt will tolerate standing at this sink during BADLs 2 minutes using LRAD OT Short Term Goal 4 (Week 1): Pt will complete UB dressing supervision  Skilled Therapeutic Interventions/Progress Updates:     Session 1: Pt received reclined in bed with MD present in room for morning rounding. Pt presenting to be in good spirits receptive to skilled OT session reporting 4/10 pain in back and knees- OT offering intermittent rest breaks, repositioning, and therapeutic support to optimize participation in therapy session. Pt requesting to use BR to attempt to have BM. Pt transitioned to EOB with HOB elevated using bed features supervision +increased time. Sit > stand using RW from elevated EOB light min A with slow rise to stand noted, however improvement from previous sessions. Engaged Pt in ~19ft of functional mobility training to BR using RW with light mod A provided to facilitate full step through with L LE and lateral weight shifting +increased time. Pt pivoted to sit on BSC over toilet using RW and grab bars with min A required for slow decent to toilet. Increased time provided on toilet for BM- continent B&B documented in flowsheets. Pt stood while using grab bars and maintained standing balance CGA while completing posterior peri-care and clothing management. Pt completed functional mobility back to [facility] using RW with min A provided for balance and to  facilitate full step through with L LE +significantly increased amount of time. Pt was left resting in [facility] with call bell in reach and all needs met. PT entering room at end of session.   Session 2:  Pt received sitting up in TISWC presenting to be in good spirits, however fatigued receptive to skilled OT session reporting 2/10 pain in back- OT offering intermittent rest breaks, repositioning, and therapeutic support to optimize participation in therapy session. Pt also reporting his legs had fallen asleep in his TISWC- Pt sitting without B LE supported on leg rests. Provided education on w/c positioning with emphasis on having B LE support when reclined in chair and pressure release to avoid skin breakdown with Pt receptive to education. Pt requesting to return to bed d/t fatigue. Positioned Pt upright in wc and engaged Pt in seated B LE exercises in preparation for short distance functional mobility to bed and be mobility. Pt completed 1x10 reps of seated alternating marches, ankle pumps, and leg extensions with OT providing verbal cues for technique. Sit>stand from [facility] using RW CGA +increased time to rise into fully standing position d/t Pt with slowed motor movements. Pt ambulated to bed and completed side steps to [facility] with min A provided for balance and L LE placement and mod verbal cues for L foot height. EOB > supine with bed flattened CGA using bed rails +increased time to bring B LEs into bed. Pt was left resting in bed with call bell in reach, bed alarm on, and all needs met.    Therapy Documentation Precautions:  Precautions Precautions:  Fall, Other (comment) Precaution Comments: hx of CVA Lt hemi Required Braces or Orthoses: Other Brace Other Brace: has an AFO for Lt foot but has not been wearing much lately, not at hospital Restrictions Weight Bearing Restrictions Per Provider Order: No  Therapy/Group: Individual Therapy  Clide Deutscher 03/01/2023, 7:49 AM

## 2023-03-01 NOTE — Progress Notes (Signed)
Physical Therapy Session Note  Patient Details  Name: Joseph Ortega MRN: [id number] Date of Birth: 23-Jun-1955  Today's Date: 03/01/2023 PT Individual Time: 1406-1430 PT Individual Time Calculation (min): 24 min   Short Term Goals: Week 1:  PT Short Term Goal 1 (Week 1): Pt will perform supine<>sit using bed features as needed with min A PT Short Term Goal 2 (Week 1): Pt will perform sit<>stands using LRAD with min A PT Short Term Goal 3 (Week 1): Pt will perform bed<>chair transfers using LRAD with min A PT Short Term Goal 4 (Week 1): Pt will ambulate at least 32ft using LRAD with min A PT Short Term Goal 5 (Week 1): Pt will initiate stair navigation training  Skilled Therapeutic Interventions/Progress Updates: Pt presented in bed sleeping but easily aroused and agreeable to therapy. Pt noted to be very lethargic throughout most of session. Session focused on functional mobility and BLE strengthening. Pt completed supine to sit with modA and use of bed features. Pt noted to take increased time to complete all activities throughout session. Pt required heavy modA to scoot anteriorly to  EOB. Bed slightly elevated and pt performed x 5 Sit to stand with RW and modA fading to minA, of note pt bracing BLE against bed however was able to adjust feet for improved erect posture. Pt required cues initially for improved anterior translation of hips but was able to demonstrate carryover with subsequent stands. Pt then sat EOB and performed LAQ with LLE with PTA providing manual resistance when bringing back to floor for hamstring recruitment. Pt completed sit to supine with minA and with verbal cues and increased time able to reposition self in bed. Pt left in bed at end of session with bed alarm on, call bell within reach and needs met.      Therapy Documentation Precautions:  Precautions Precautions: Fall, Other (comment) Precaution Comments: hx of CVA Lt hemi Required Braces or Orthoses: Other  Brace Other Brace: has an AFO for Lt foot but has not been wearing much lately, not at hospital Restrictions Weight Bearing Restrictions Per Provider Order: No General:   Vital Signs: Therapy Vitals Temp: 97.7 F (36.5 C) Temp Source: Oral Pulse Rate: (!) 57 Resp: 18 BP: 136/63 Patient Position (if appropriate): Lying Oxygen Therapy SpO2: 96 % O2 Device: Room Air   Therapy/Group: Individual Therapy  Abdirahman Chittum 03/01/2023, 4:15 PM

## 2023-03-02 DIAGNOSIS — S069X9A Unspecified intracranial injury with loss of consciousness of unspecified duration, initial encounter: Secondary | ICD-10-CM

## 2023-03-02 LAB — GLUCOSE, CAPILLARY
Glucose-Capillary: 212 mg/dL — ABNORMAL HIGH (ref 70–99)
Glucose-Capillary: 193 mg/dL — ABNORMAL HIGH (ref 70–99)
Glucose-Capillary: 218 mg/dL — ABNORMAL HIGH (ref 70–99)
Glucose-Capillary: 207 mg/dL — ABNORMAL HIGH (ref 70–99)

## 2023-03-02 MED ORDER — INSULIN GLARGINE-YFGN 100 UNIT/ML ~~LOC~~ SOLN
23.0000 [IU] | Freq: Every day | SUBCUTANEOUS | Status: DC
  Administered 2023-03-02 – 2023-03-10 (×9): 23 [IU] via SUBCUTANEOUS
  Filled 2023-03-02 (×11): qty 0.23

## 2023-03-02 NOTE — Progress Notes (Signed)
Occupational Therapy Session Note  Patient Details  Name: Joseph Ortega MRN: [id number] Date of Birth: 09-16-1955  Today's Date: 03/02/2023 OT Individual Time: 0800-0900 OT Individual Time Calculation (min): 60 min     Skilled Therapeutic Interventions/Progress Updates: Patient received resting in bed, finishing breakfast. Patient motivated to get OOB and washed up this morning. Patient able to get to EOB with Minimal assist of the LLE. Transferred to w/c with Min assist using the walker for stand stepping transfer. Bathed and dressed sitting at the sink with functional levels below. Good participation and improving independence with self care skills. Continue with skilled OT POC working towards LTGs and discharge home.     Therapy Documentation Precautions:  Precautions Precautions: Fall, Other (comment) Precaution Comments: hx of CVA Lt hemi Required Braces or Orthoses: Other Brace Other Brace: has an AFO for Lt foot but has not been wearing much lately, not at hospital Restrictions Weight Bearing Restrictions Per Provider Order: No General:   Vital Signs: Therapy Vitals Temp: 97.6 F (36.4 C) Temp Source: Oral Pulse Rate: (!) 56 Resp: 18 BP: 131/60 Patient Position (if appropriate): Standing Oxygen Therapy SpO2: 95 % O2 Device: Room Air Pain: Pain Assessment Pain Scale: 0-10 Pain Score: 0-No pain ADL: ADL Eating: Set up Where Assessed-Eating: Bed level Grooming: Minimal assistance Where Assessed-Grooming: Bed level Upper Body Bathing: Min assistance Where Assessed-Upper Body Bathing: sink Lower Body Bathing: Moderate assistance Where Assessed-Lower Body Bathing: sink Upper Body Dressing: Moderate assistance,  Where Assessed-Upper Body Dressing: wheel chair Lower Body Dressing: Mod/Maximal assistance with reacher Where Assessed-Lower Body Dressing: wheelchair Toileting: set up for use of the urinal    Therapy/Group: Individual Therapy  Warnell Forester 03/02/2023, 12:50 PM

## 2023-03-02 NOTE — Progress Notes (Signed)
PROGRESS NOTE   Subjective/Complaints:  No BM since rehab admission   ROS- neg CP, SOB, N/V/D, + Back pain and Head pain on Left  Objective:   No results found. Recent Labs    02/28/23 0623  WBC 5.9  HGB 12.3*  HCT 36.0*  PLT 173   Recent Labs    02/28/23 0623  NA 138  K 3.5  CL 103  CO2 30  GLUCOSE 207*  BUN 22  CREATININE 0.80  CALCIUM 8.6*    Intake/Output Summary (Last 24 hours) at 03/02/2023 1055 Last data filed at 03/02/2023 0824 Gross per 24 hour  Intake 1176 ml  Output 1125 ml  Net 51 ml        Physical Exam: Vital Signs Blood pressure 131/60, pulse (!) 56, temperature 97.6 F (36.4 C), temperature source Oral, resp. rate 18, weight 105 kg, SpO2 95%.   General: No acute distress Mood and affect are appropriate Heart: Regular rate and rhythm no rubs murmurs or extra sounds Lungs: Clear to auscultation, breathing unlabored, no rales or wheezes Abdomen: Positive bowel sounds, soft nontender to palpation, nondistended Extremities: No clubbing, cyanosis, or edema Skin: No evidence of breakdown, no evidence of rash Neurologic: Cranial nerves II through XII intact, motor strength is 5/5 in RIght and 3- left  deltoid, bicep, tricep, grip, hip flexor, knee extensors, ankle dorsiflexor and plantar flexor  Musculoskeletal:Mild pain with R knee ROM  Assessment/Plan: 1. Functional deficits which require 3+ hours per day of interdisciplinary therapy in a comprehensive inpatient rehab setting. Physiatrist is providing close team supervision and 24 hour management of active medical problems listed below. Physiatrist and rehab team continue to assess barriers to discharge/monitor patient progress toward functional and medical goals  Care Tool:  Bathing    Body parts bathed by patient: Left arm, Abdomen, Chest, Front perineal area, Right upper leg, Left upper leg, Face   Body parts bathed by helper:  Left lower leg, Right lower leg, Right arm, Left arm, Buttocks     Bathing assist Assist Level: Moderate Assistance - Patient 50 - 74%     Upper Body Dressing/Undressing Upper body dressing   What is the patient wearing?: Pull over shirt    Upper body assist Assist Level: Moderate Assistance - Patient 50 - 74%    Lower Body Dressing/Undressing Lower body dressing      What is the patient wearing?: Incontinence brief, Pants     Lower body assist Assist for lower body dressing: Total Assistance - Patient < 25%     Toileting Toileting Toileting Activity did not occur Press photographer and hygiene only): N/A (no void or bm)  Toileting assist Assist for toileting: Dependent - Patient 0% (condom catheter)     Transfers Chair/bed transfer  Transfers assist  Chair/bed transfer activity did not occur: Safety/medical concerns  Chair/bed transfer assist level: Moderate Assistance - Patient 50 - 74% Chair/bed transfer assistive device: Walker, Archivist   Ambulation assist   Ambulation activity did not occur: Safety/medical concerns (requires skilled assist and +2 w/c follow)  Assist level: Minimal Assistance - Patient > 75% Assistive device: Walker-rolling Max distance: 73   Walk  10 feet activity   Assist  Walk 10 feet activity did not occur: Safety/medical concerns  Assist level: Minimal Assistance - Patient > 75% Assistive device: Walker-rolling   Walk 50 feet activity   Assist Walk 50 feet with 2 turns activity did not occur: Safety/medical concerns  Assist level: Minimal Assistance - Patient > 75% Assistive device: Walker-rolling    Walk 150 feet activity   Assist Walk 150 feet activity did not occur: Safety/medical concerns         Walk 10 feet on uneven surface  activity   Assist Walk 10 feet on uneven surfaces activity did not occur: Safety/medical concerns         Wheelchair     Assist Is the patient  using a wheelchair?: Yes Type of Wheelchair: Manual (TIS w/c)    Wheelchair assist level: Dependent - Patient 0%      Wheelchair 50 feet with 2 turns activity    Assist        Assist Level: Dependent - Patient 0%   Wheelchair 150 feet activity     Assist      Assist Level: Dependent - Patient 0%   Blood pressure 131/60, pulse (!) 56, temperature 97.6 F (36.4 C), temperature source Oral, resp. rate 18, weight 105 kg, SpO2 95%.  Medical Problem List and Plan: 1. 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  Antithrombotics: -DVT/anticoagulation:  Pharmaceutical: Lovenox added per discussion with NS.              -antiplatelet therapy:  Hold ASA 3. Pain Management: back and left knee pain since fall             --Headaches worse but managed with tramadol  12/14- having back and  neck pain- since fall- doesn't like opiates since makes him fuzzy, so only taking tylenol during day- tramadol at night.  4. Mood/Behavior/Sleep: LCSW to follow for evaluation and support.              -antipsychotic agents: N/A 5. Neuropsych/cognition: This patient is capable of making decisions on his own behalf. 6. Skin/Wound Care: Routine pressure relief measures             --will add Vitamin C/Zincd 7. Fluids/Electrolytes/Nutrition: Monitor  I/O. Check CMET in am.     Latest Ref Rng & Units 02/28/2023    6:23 AM 02/26/2023    6:08 AM 02/24/2023    6:43 AM  BMP  Glucose 70 - 99 mg/dL 295  284  132   BUN 8 - 23 mg/dL 22  24  28    Creatinine 0.61 - 1.24 mg/dL 4.40  1.02  7.25   Sodium 135 - 145 mmol/L 138  135  139   Potassium 3.5 - 5.1 mmol/L 3.5  3.9  3.8   Chloride 98 - 111 mmol/L 103  99  102   CO2 22 - 32 mmol/L 30  31  30    Calcium 8.9 - 10.3 mg/dL 8.6  8.6  8.8     8. T2DM poorly controlled: Hgb A1c-10.1--was 8.6 10/2022. Tends to miss  insulin doses per reports             --was on Levemir 40 u pm, trulicity 3  mg/wk, Novolog TID, Farxiaga 10 mg             --monitor BS ac/hs and titrate Levemir to home dose.   CBG (last 3)  Recent Labs    03/01/23 1644 03/01/23 2051 03/02/23 0605  GLUCAP 249* 268* 212*  Increase Semglee to 20U   12/14- CBGs-actually higher- 212-249- will increase Semglee to 23 units  9. HTN: Monitor BP TID. Continue Coreg bid and hydrochlorothiazide Vitals:   03/02/23 0604 03/02/23 0800  BP: (!) 147/87 131/60  Pulse: (!) 59 (!) 56  Resp:    Temp:    SpO2:     May increase hydrochlorothiazide if BP increases  12/14- BP 147 systolic this AM, however yesterday was controlled- will wait today to increase hydrochlorothiazide.  10. Acute on chronic headaches: Worse now but tramadol prn effective --Continue Topamax 12/14- denied HA's this AM- 11.  ABLA/Pelvic hematoma: Monitor H/H serially. Recheck CBC in am    Latest Ref Rng & Units 02/28/2023    6:23 AM 02/26/2023    6:08 AM 02/24/2023    6:43 AM  CBC  WBC 4.0 - 10.5 K/uL 5.9  7.5  7.4   Hemoglobin 13.0 - 17.0 g/dL 40.9  81.1  91.4   Hematocrit 39.0 - 52.0 % 36.0  37.7  38.0   Platelets 150 - 400 K/uL 173  171  191     12. Lymphedema w/stasis ulcer LLE: Daily wound care with silver Hydrofiber for moisture control and antimicrobial effect.  13. OSA: Continue CPAP.  14.  GB wall thickening: No signs of cholecystitis 15. Anxiety/Depression: controlled on buspar and Cymbalta  (intergrated psychology)  16. Tinea pedis left: Continue Clotrimazole for local measures.  17. Constipation: Will start Miralax 34 g today followed by 17 grams daily. Pt states this is not as good as pericolace which he takes at home will increase senna S    12/14- LBM yesterday  I spent a total of 36   minutes on total care today- >50% coordination of care- due to  Review of chart, notes; labs, and vitas- as well as CBGs- mgmt- made changes moderate level med mgmt.    LOS: 5 days A FACE TO FACE EVALUATION WAS PERFORMED  Hanako Tipping 03/02/2023, 10:55 AM

## 2023-03-02 NOTE — Progress Notes (Signed)
Physical Therapy Session Note  Patient Details  Name: Joseph Ortega MRN: [id number] Date of Birth: Apr 18, 1955  Today's Date: 03/02/2023 PT Individual Time: 1020-1107 and 4098-1191 PT Individual Time Calculation (min): 47 min and 44 min  Short Term Goals: Week 1:  PT Short Term Goal 1 (Week 1): Pt will perform supine<>sit using bed features as needed with min A PT Short Term Goal 2 (Week 1): Pt will perform sit<>stands using LRAD with min A PT Short Term Goal 3 (Week 1): Pt will perform bed<>chair transfers using LRAD with min A PT Short Term Goal 4 (Week 1): Pt will ambulate at least 24ft using LRAD with min A PT Short Term Goal 5 (Week 1): Pt will initiate stair navigation training  Skilled Therapeutic Interventions/Progress Updates:    Session 1: Pt received sitting in TIS w/c awake, in good spirits and agreeable to therapy session. Pt requesting to practice his balance while performing laundry tasks.  Transported to laundry room in w/c for time management and energy conservation.  Sit<>stands TIS w/c<>RW with CGA throughout session - pt demos significant improvement in ability to power up and come to standing much quicker - continues to be able to place L hand on RW without assist and uses R LE to push L LE back underneath his BOS before initiating the stand - improved anterior weight shift.  Dynamic balance while turning in laundry room using RW for support to pick up bag of laundry from floor and place clothes into washing machine all with only CGA for steadying. Pt able to select correct washing settings without cuing. Pt reports at home he uses a laundry basket and kicks it from his room to the laundry area (cause he can't carry it and use RW at the same time) and then picks up the laundry basket to dump the clothes into the machine. Therapist will need to work on simulating this task to assess safety and problem solve best technique.   Gait training 75ft + 47ft using RW with CGA for  safety.  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  Pt reports he also uses a rollator at home when going out of the house; however, primarily uses RW inside the house.  Pt requesting to return to bed and rest before next session. Sit>supine with supervision, HOB slightly elevated and using bedrail. Pt left supine in bed with needs in reach and bed alarm on.  Session 2: Pt received supine in bed awake and agreeable to therapy session. Supine>sitting L EOB, HOB flat and trying not to use the bedrail, but had to in order to be successful, but still requires mod A - attempted to have pt perform logroll technique, but legs went off EOB prior to pt having a chance to push up through his R arm to bring his trunk upright causing increased low back pain - provided increased support and assist for improved timing and rose with mod A and less pain. Needs to continue addressing bed mobility.   Sit<>stands using RW with CGA - slower to rise this afternoon due to the increase in his back pain - same technique as above.   Gait training ~39ft using RW to laundry room with CGA for  safety and therapist bringing w/c in event of need for seated break. Continues to demo above gait deviations; however, slightly slower this afternoon due to the back discomfort.  Standing in laundry room able to transfer clothes from washer to dryer using UE support on the machines for balance with CGA for steadying - pt with some challenges problem solving how to maneuver RW in this space while still accessing the machines. Pt's L visual peripheral field deficits were more noticeable with this task as pt not seeing some of the  options on the machine. Pt dropped a sock on the floor and required cuing to recall recommendation to use reacher rather than trying to bend down to pick it up from the floor.  Pt reports his w/c cushion is not comfortable for prolong periods of time - provided pt full Roho cushion rather than the dual air and foam - pt reports significant improvement in his comfort and upright tolerance.   Gait training ~37ft back to room using RW with CGA as described above - therapist continuing to cue for improved upright posture and more fluid/symmetrical weight shifting. Pt left seated in TIS w/c with needs in reach and seat belt alarm on.  Therapy Documentation Precautions:  Precautions Precautions: Fall, Other (comment) Precaution Comments: hx of CVA Lt hemi Required Braces or Orthoses: Other Brace Other Brace: has an AFO for Lt foot but has not been wearing much lately, not at hospital Restrictions Weight Bearing Restrictions Per Provider Order: No   Pain:  Session 1: Reports his mid back pain is feeling better today, no complaints of pain during session.  Session 2: Pt with onset of back pain during bed mobility - modified technique to decrease pain - nurse notified and present during session for medication administration.    Therapy/Group: Individual Therapy  Ginny Forth , PT, DPT, NCS, CSRS 03/02/2023, 8:03 AM

## 2023-03-02 NOTE — Progress Notes (Signed)
Speech Language Pathology Daily Session Note  Patient Details  Name: Joseph Ortega MRN: [id number] Date of Birth: 03-20-1955  Today's Date: 03/02/2023 SLP Individual Time: 1445-1530 SLP Individual Time Calculation (min): 45 min  Short Term Goals: Week 1: SLP Short Term Goal 1 (Week 1): Pt will selectively attend to structured and/or functional tasks in a mildly distracting environment for 10 minutes given min A SLP Short Term Goal 2 (Week 1): Pt will recall novel information given a 5-7 minute delay with 80% acc with min A SLP Short Term Goal 3 (Week 1): Pt will complete moderately complex problem solving with 80% acc with min A SLP Short Term Goal 4 (Week 1): Patient will identify two cognitive and two physical changes since admission given min multimodal A  Skilled Therapeutic Interventions: Skilled therapy session focused on cognitive goals. SLP faciliated session by providing minA during medication management activity. SLP provided written and verbal instructions for patient to place "pills" in container. Activity encouraged use of problem solving, awareness, and organizational skills. SLP provided strategy of placing one pill in each container and closing box to eliminate risk of over medication. Patient left in chair with alarm set and call bell in reach. Continue POC.    Pain None reported   Therapy/Group: Individual Therapy  Mckaylah Bettendorf M.A., CF-SLP 03/02/2023, 7:42 AM

## 2023-03-02 NOTE — Plan of Care (Signed)
  Problem: Consults Goal: RH BRAIN INJURY PATIENT EDUCATION Description: Description: See Patient Education module for eduction specifics Outcome: Progressing   Problem: RH BOWEL ELIMINATION Goal: RH STG MANAGE BOWEL WITH ASSISTANCE Description: STG Manage Bowel with mod I Assistance. Outcome: Progressing Goal: RH STG MANAGE BOWEL W/MEDICATION W/ASSISTANCE Description: STG Manage Bowel with Medication with mod I  Assistance. Outcome: Progressing   Problem: RH BLADDER ELIMINATION Goal: RH STG MANAGE BLADDER WITH ASSISTANCE Description: STG Manage Bladder With mod I Assistance Outcome: Progressing   Problem: RH SAFETY Goal: RH STG ADHERE TO SAFETY PRECAUTIONS W/ASSISTANCE/DEVICE Description: STG Adhere to Safety Precautions With cues Assistance/Device. Outcome: Progressing   Problem: RH COGNITION-NURSING Goal: RH STG USES MEMORY AIDS/STRATEGIES W/ASSIST TO PROBLEM SOLVE Description: STG Uses Memory Aids/Strategies With cues Assistance to Problem Solve. Outcome: Progressing   Problem: RH PAIN MANAGEMENT Goal: RH STG PAIN MANAGED AT OR BELOW PT'S PAIN GOAL Description: < 4 with prns Outcome: Progressing   Problem: RH KNOWLEDGE DEFICIT BRAIN INJURY Goal: RH STG INCREASE KNOWLEDGE OF SELF CARE AFTER BRAIN INJURY Description: Patient and wife will be able to manage care at discharge using educational resources for medications and dietary modification independently Outcome: Progressing   Problem: RH KNOWLEDGE DEFICIT Goal: RH STG INCREASE KNOWLEDGE OF DIABETES Description: Patient and wife will be able to manage DM at discharge using educational resources for medications and dietary modification independently Outcome: Progressing Goal: RH STG INCREASE KNOWLEDGE OF HYPERTENSION Description: Patient and wife will be able to manage HTN at discharge using educational resources for medications and dietary modification independently Outcome: Progressing Goal: RH STG INCREASE KNOWLEGDE  OF HYPERLIPIDEMIA Description: Patient and wife will be able to manage HLD at discharge using educational resources for medications and dietary modification independently Outcome: Progressing   Problem: Education: Goal: Ability to describe self-care measures that may prevent or decrease complications (Diabetes Survival Skills Education) will improve Outcome: Progressing Goal: Individualized Educational Video(s) Outcome: Progressing   Problem: Coping: Goal: Ability to adjust to condition or change in health will improve Outcome: Progressing   Problem: Fluid Volume: Goal: Ability to maintain a balanced intake and output will improve Outcome: Progressing   Problem: Health Behavior/Discharge Planning: Goal: Ability to identify and utilize available resources and services will improve Outcome: Progressing Goal: Ability to manage health-related needs will improve Outcome: Progressing   Problem: Metabolic: Goal: Ability to maintain appropriate glucose levels will improve Outcome: Progressing   Problem: Nutritional: Goal: Maintenance of adequate nutrition will improve Outcome: Progressing Goal: Progress toward achieving an optimal weight will improve Outcome: Progressing   Problem: Skin Integrity: Goal: Risk for impaired skin integrity will decrease Outcome: Progressing   Problem: Tissue Perfusion: Goal: Adequacy of tissue perfusion will improve Outcome: Progressing

## 2023-03-03 DIAGNOSIS — S069X9A Unspecified intracranial injury with loss of consciousness of unspecified duration, initial encounter: Secondary | ICD-10-CM | POA: Diagnosis not present

## 2023-03-03 LAB — GLUCOSE, CAPILLARY
Glucose-Capillary: 169 mg/dL — ABNORMAL HIGH (ref 70–99)
Glucose-Capillary: 191 mg/dL — ABNORMAL HIGH (ref 70–99)
Glucose-Capillary: 182 mg/dL — ABNORMAL HIGH (ref 70–99)
Glucose-Capillary: 218 mg/dL — ABNORMAL HIGH (ref 70–99)

## 2023-03-03 NOTE — Progress Notes (Signed)
Placed patient on CPAP doe the night

## 2023-03-03 NOTE — Plan of Care (Signed)
  Problem: Consults Goal: RH BRAIN INJURY PATIENT EDUCATION Description: Description: See Patient Education module for eduction specifics Outcome: Progressing   Problem: RH BOWEL ELIMINATION Goal: RH STG MANAGE BOWEL WITH ASSISTANCE Description: STG Manage Bowel with mod I Assistance. Outcome: Progressing Goal: RH STG MANAGE BOWEL W/MEDICATION W/ASSISTANCE Description: STG Manage Bowel with Medication with mod I  Assistance. Outcome: Progressing   Problem: RH BLADDER ELIMINATION Goal: RH STG MANAGE BLADDER WITH ASSISTANCE Description: STG Manage Bladder With mod I Assistance Outcome: Progressing   Problem: RH SAFETY Goal: RH STG ADHERE TO SAFETY PRECAUTIONS W/ASSISTANCE/DEVICE Description: STG Adhere to Safety Precautions With cues Assistance/Device. Outcome: Progressing   Problem: RH COGNITION-NURSING Goal: RH STG USES MEMORY AIDS/STRATEGIES W/ASSIST TO PROBLEM SOLVE Description: STG Uses Memory Aids/Strategies With cues Assistance to Problem Solve. Outcome: Progressing   Problem: RH PAIN MANAGEMENT Goal: RH STG PAIN MANAGED AT OR BELOW PT'S PAIN GOAL Description: < 4 with prns Outcome: Progressing   Problem: RH KNOWLEDGE DEFICIT BRAIN INJURY Goal: RH STG INCREASE KNOWLEDGE OF SELF CARE AFTER BRAIN INJURY Description: Patient and wife will be able to manage care at discharge using educational resources for medications and dietary modification independently Outcome: Progressing   Problem: RH KNOWLEDGE DEFICIT Goal: RH STG INCREASE KNOWLEDGE OF DIABETES Description: Patient and wife will be able to manage DM at discharge using educational resources for medications and dietary modification independently Outcome: Progressing Goal: RH STG INCREASE KNOWLEDGE OF HYPERTENSION Description: Patient and wife will be able to manage HTN at discharge using educational resources for medications and dietary modification independently Outcome: Progressing Goal: RH STG INCREASE KNOWLEGDE  OF HYPERLIPIDEMIA Description: Patient and wife will be able to manage HLD at discharge using educational resources for medications and dietary modification independently Outcome: Progressing   Problem: Education: Goal: Ability to describe self-care measures that may prevent or decrease complications (Diabetes Survival Skills Education) will improve Outcome: Progressing Goal: Individualized Educational Video(s) Outcome: Progressing   Problem: Coping: Goal: Ability to adjust to condition or change in health will improve Outcome: Progressing   Problem: Fluid Volume: Goal: Ability to maintain a balanced intake and output will improve Outcome: Progressing   Problem: Health Behavior/Discharge Planning: Goal: Ability to identify and utilize available resources and services will improve Outcome: Progressing Goal: Ability to manage health-related needs will improve Outcome: Progressing   Problem: Metabolic: Goal: Ability to maintain appropriate glucose levels will improve Outcome: Progressing   Problem: Nutritional: Goal: Maintenance of adequate nutrition will improve Outcome: Progressing Goal: Progress toward achieving an optimal weight will improve Outcome: Progressing   Problem: Skin Integrity: Goal: Risk for impaired skin integrity will decrease Outcome: Progressing   Problem: Tissue Perfusion: Goal: Adequacy of tissue perfusion will improve Outcome: Progressing

## 2023-03-03 NOTE — Progress Notes (Signed)
PROGRESS NOTE   Subjective/Complaints:  Pt reports LBM was this AM.  But was incontinent- usually continent- but couldn't hold it.   Pt reports walked 90 ft with RW with therapy and going well-   Asked me to call his wife to discuss his medical issues-    ROS-   Pt denies SOB, abd pain, CP, N/V/C/D, and vision changes  Except for HPI is negative   No results found. No results for input(s): "WBC", "HGB", "HCT", "PLT" in the last 72 hours.  No results for input(s): "NA", "K", "CL", "CO2", "GLUCOSE", "BUN", "CREATININE", "CALCIUM" in the last 72 hours.   Intake/Output Summary (Last 24 hours) at 03/03/2023 0942 Last data filed at 03/03/2023 0827 Gross per 24 hour  Intake 696 ml  Output 2400 ml  Net -1704 ml        Physical Exam: Vital Signs Blood pressure (!) 146/67, pulse 61, temperature 98.4 F (36.9 C), temperature source Oral, resp. rate 18, weight 104.8 kg, SpO2 94%.    General: awake, alert, appropriate,  on side getting cleaned up from incontinent stool;  NAD HENT: conjugate gaze; oropharynx moist CV: regular rate and rhythm; no JVD Pulmonary: CTA B/L; no W/R/R- good air movement GI: soft, NT, ND, (+)BS now slightly hypoactive since had stool Psychiatric: appropriate Neurological: alert- asking me to call wife, Eunice Blase- was able ot recite her phone number- was correct Skin: appears to have stage II- a little raw between buttocks Neurologic: Cranial nerves II through XII intact, motor strength is 5/5 in RIght and 3- left  deltoid, bicep, tricep, grip, hip flexor, knee extensors, ankle dorsiflexor and plantar flexor  Musculoskeletal:Mild pain with R knee ROM  Assessment/Plan: 1. Functional deficits which require 3+ hours per day of interdisciplinary therapy in a comprehensive inpatient rehab setting. Physiatrist is providing close team supervision and 24 hour management of active medical problems listed  below. Physiatrist and rehab team continue to assess barriers to discharge/monitor patient progress toward functional and medical goals  Care Tool:  Bathing    Body parts bathed by patient: Left arm, Abdomen, Chest, Front perineal area, Right upper leg, Left upper leg, Face   Body parts bathed by helper: Left lower leg, Right lower leg, Right arm, Left arm, Buttocks     Bathing assist Assist Level: Moderate Assistance - Patient 50 - 74%     Upper Body Dressing/Undressing Upper body dressing   What is the patient wearing?: Pull over shirt    Upper body assist Assist Level: Moderate Assistance - Patient 50 - 74%    Lower Body Dressing/Undressing Lower body dressing      What is the patient wearing?: Incontinence brief, Pants     Lower body assist Assist for lower body dressing: Total Assistance - Patient < 25%     Toileting Toileting Toileting Activity did not occur Press photographer and hygiene only): N/A (no void or bm)  Toileting assist Assist for toileting: Dependent - Patient 0% (condom catheter)     Transfers Chair/bed transfer  Transfers assist  Chair/bed transfer activity did not occur: Safety/medical concerns  Chair/bed transfer assist level: Contact Guard/Touching assist Chair/bed transfer assistive device: Armrests, Environmental consultant  Locomotion Ambulation   Ambulation assist   Ambulation activity did not occur: Safety/medical concerns (requires skilled assist and +2 w/c follow)  Assist level: Contact Guard/Touching assist Assistive device: Walker-rolling Max distance: 33ft   Walk 10 feet activity   Assist  Walk 10 feet activity did not occur: Safety/medical concerns  Assist level: Contact Guard/Touching assist Assistive device: Walker-rolling   Walk 50 feet activity   Assist Walk 50 feet with 2 turns activity did not occur: Safety/medical concerns  Assist level: Contact Guard/Touching assist Assistive device: Walker-rolling    Walk 150  feet activity   Assist Walk 150 feet activity did not occur: Safety/medical concerns         Walk 10 feet on uneven surface  activity   Assist Walk 10 feet on uneven surfaces activity did not occur: Safety/medical concerns         Wheelchair     Assist Is the patient using a wheelchair?: Yes Type of Wheelchair: Manual (TIS w/c)    Wheelchair assist level: Dependent - Patient 0%      Wheelchair 50 feet with 2 turns activity    Assist        Assist Level: Dependent - Patient 0%   Wheelchair 150 feet activity     Assist      Assist Level: Dependent - Patient 0%   Blood pressure (!) 146/67, pulse 61, temperature 98.4 F (36.9 C), temperature source Oral, resp. rate 18, weight 104.8 kg, SpO2 94%.  Medical Problem List and Plan: 1. 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  Antithrombotics: -DVT/anticoagulation:  Pharmaceutical: Lovenox added per discussion with NS.              -antiplatelet therapy:  Hold ASA 3. Pain Management: back and left knee pain since fall             --Headaches worse but managed with tramadol  12/14- having back and  neck pain- since fall- doesn't like opiates since makes him fuzzy, so only taking tylenol during day- tramadol at night.  4. Mood/Behavior/Sleep: LCSW to follow for evaluation and support.              -antipsychotic agents: N/A 5. Neuropsych/cognition: This patient is capable of making decisions on his own behalf. 6. Skin/Wound Care: Routine pressure relief measures             --will add Vitamin C/Zincd 7. Fluids/Electrolytes/Nutrition: Monitor  I/O. Check CMET in am.     Latest Ref Rng & Units 02/28/2023    6:23 AM 02/26/2023    6:08 AM 02/24/2023    6:43 AM  BMP  Glucose 70 - 99 mg/dL 191  478  295   BUN 8 - 23 mg/dL 22  24  28    Creatinine 0.61 - 1.24 mg/dL 6.21   3.08  6.57   Sodium 135 - 145 mmol/L 138  135  139   Potassium 3.5 - 5.1 mmol/L 3.5  3.9  3.8   Chloride 98 - 111 mmol/L 103  99  102   CO2 22 - 32 mmol/L 30  31  30    Calcium 8.9 - 10.3 mg/dL 8.6  8.6  8.8     8. T2DM poorly controlled:  Hgb A1c-10.1--was 8.6 10/2022. Tends to miss insulin doses per reports             --was on Levemir 40 u pm, trulicity 3 mg/wk, Novolog TID, Farxiaga 10 mg             --monitor BS ac/hs and titrate Levemir to home dose.   CBG (last 3)  Recent Labs    03/02/23 1704 03/02/23 2126 03/03/23 0628  GLUCAP 218* 207* 169*  Increase Semglee to 20U   12/14- CBGs-actually higher- 212-249- will increase Semglee to 23 units   12/15- just got increased dose- still in 200's- con't to monitor trend 9. HTN: Monitor BP TID. Continue Coreg bid and hydrochlorothiazide Vitals:   03/03/23 0244 03/03/23 0814  BP: 136/68 (!) 146/67  Pulse: (!) 52 61  Resp: 18   Temp: 98.4 F (36.9 C)   SpO2: 94%    May increase hydrochlorothiazide if BP increases  12/14- BP 147 systolic this AM, however yesterday was controlled- will wait today to increase hydrochlorothiazide.   12/15- BP running 128-146 systolic in last 24 hours- I will wait to increase BP meds 10. Acute on chronic headaches: Worse now but tramadol prn effective --Continue Topamax 12/14- denied HA's this AM- 11.  ABLA/Pelvic hematoma: Monitor H/H serially. Recheck CBC in am    Latest Ref Rng & Units 02/28/2023    6:23 AM 02/26/2023    6:08 AM 02/24/2023    6:43 AM  CBC  WBC 4.0 - 10.5 K/uL 5.9  7.5  7.4   Hemoglobin 13.0 - 17.0 g/dL 16.1  09.6  04.5   Hematocrit 39.0 - 52.0 % 36.0  37.7  38.0   Platelets 150 - 400 K/uL 173  171  191     12. Lymphedema w/stasis ulcer LLE: Daily wound care with silver Hydrofiber for moisture control and antimicrobial effect.  13. OSA: Continue CPAP.  14.  GB wall thickening: No signs of cholecystitis 15. Anxiety/Depression: controlled on buspar and Cymbalta  (intergrated  psychology)  16. Tinea pedis left: Continue Clotrimazole for local measures.  17. Constipation: Will start Miralax 34 g today followed by 17 grams daily. Pt states this is not as good as pericolace which he takes at home will increase senna S    12/14- LBM yesterday  12/15- LBM this AM- incontinent which I hear from pt and staff is unusual for him 18. Stage II between buttocks- B/L inner buttocks  12/15- d/w charge nurse=- they will get picture and start barrier cream and monitor-   I spent a total of 35   minutes on total care today- >50% coordination of care- due to  D/w nursing x2 abut stage II between buttocks as well as attempting to call wife x2 different times- also let charge nurse nothing acute- doing well- just following pt's request to call his wife.     LOS: 6 days A FACE TO FACE EVALUATION WAS PERFORMED  Duong Haydel 03/03/2023, 9:42 AM

## 2023-03-04 DIAGNOSIS — S069X1S Unspecified intracranial injury with loss of consciousness of 30 minutes or less, sequela: Secondary | ICD-10-CM

## 2023-03-04 DIAGNOSIS — F4323 Adjustment disorder with mixed anxiety and depressed mood: Secondary | ICD-10-CM | POA: Diagnosis not present

## 2023-03-04 DIAGNOSIS — K5901 Slow transit constipation: Secondary | ICD-10-CM | POA: Diagnosis not present

## 2023-03-04 DIAGNOSIS — F320 Major depressive disorder, single episode, mild: Secondary | ICD-10-CM

## 2023-03-04 DIAGNOSIS — M961 Postlaminectomy syndrome, not elsewhere classified: Secondary | ICD-10-CM | POA: Diagnosis not present

## 2023-03-04 DIAGNOSIS — S069X1D Unspecified intracranial injury with loss of consciousness of 30 minutes or less, subsequent encounter: Secondary | ICD-10-CM | POA: Diagnosis not present

## 2023-03-04 LAB — BASIC METABOLIC PANEL
Anion gap: 5 (ref 5–15)
BUN: 18 mg/dL (ref 8–23)
CO2: 27 mmol/L (ref 22–32)
Calcium: 8.6 mg/dL — ABNORMAL LOW (ref 8.9–10.3)
Chloride: 104 mmol/L (ref 98–111)
Creatinine, Ser: 0.81 mg/dL (ref 0.61–1.24)
GFR, Estimated: >60 mL/min (ref >60)
Glucose, Bld: 131 mg/dL — ABNORMAL HIGH (ref 70–99)
Potassium: 3 mmol/L — ABNORMAL LOW (ref 3.5–5.1)
Sodium: 136 mmol/L (ref 135–145)

## 2023-03-04 LAB — CBC
HCT: 38.5 % — ABNORMAL LOW (ref 39.0–52.0)
Hemoglobin: 13.4 g/dL (ref 13.0–17.0)
MCH: 32 pg (ref 26.0–34.0)
MCHC: 34.8 g/dL (ref 30.0–36.0)
MCV: 91.9 fL (ref 80.0–100.0)
Platelets: 214 10*3/uL (ref 150–400)
RBC: 4.19 MIL/uL — ABNORMAL LOW (ref 4.22–5.81)
RDW: 13.6 % (ref 11.5–15.5)
WBC: 4.9 10*3/uL (ref 4.0–10.5)
nRBC: 0 % (ref 0.0–0.2)

## 2023-03-04 LAB — GLUCOSE, CAPILLARY
Glucose-Capillary: 128 mg/dL — ABNORMAL HIGH (ref 70–99)
Glucose-Capillary: 179 mg/dL — ABNORMAL HIGH (ref 70–99)
Glucose-Capillary: 144 mg/dL — ABNORMAL HIGH (ref 70–99)
Glucose-Capillary: 199 mg/dL — ABNORMAL HIGH (ref 70–99)

## 2023-03-04 MED ORDER — INSULIN ASPART 100 UNIT/ML IJ SOLN
3.0000 [IU] | Freq: Three times a day (TID) | INTRAMUSCULAR | Status: DC
  Administered 2023-03-04 – 2023-03-11 (×19): 3 [IU] via SUBCUTANEOUS

## 2023-03-04 MED ORDER — POTASSIUM CHLORIDE CRYS ER 20 MEQ PO TBCR
40.0000 meq | EXTENDED_RELEASE_TABLET | Freq: Every day | ORAL | Status: AC
  Administered 2023-03-04 – 2023-03-06 (×3): 40 meq via ORAL
  Filled 2023-03-04 (×3): qty 2

## 2023-03-04 NOTE — Progress Notes (Signed)
Physical Therapy TBI Note  Patient Details  Name: Joseph Ortega MRN: [id number] Date of Birth: 1955-08-03  Today's Date: 03/04/2023 PT Individual Time: 1300-1358 PT Individual Time Calculation (min): 58 min   Short Term Goals: Week 1:  PT Short Term Goal 1 (Week 1): Pt will perform supine<>sit using bed features as needed with min A PT Short Term Goal 2 (Week 1): Pt will perform sit<>stands using LRAD with min A PT Short Term Goal 3 (Week 1): Pt will perform bed<>chair transfers using LRAD with min A PT Short Term Goal 4 (Week 1): Pt will ambulate at least 35ft using LRAD with min A PT Short Term Goal 5 (Week 1): Pt will initiate stair navigation training  Skilled Therapeutic Interventions/Progress Updates:     Session focused on functional gait training with RW, stair negotiation in preparation for home entry, NMR for balance, strength and coordination in standing, and fall risk assessment.   Pt performed sit <> stands throughout session with CGA and extra time for transitions, managing LUE on and off of RW independently with extra time.  Gait training x 60' x 2 without break with overall CGA with focus on increasing heel strike and clearance of LLE with intermittent cues for safe positioning of RW.  Stair negotiation up/down 4 steps ascending with overall min assist step to pattern with extra time for LUE advancement on rail. For descending required up to heavy mod assist due to decreased coordination and control of LLE placement and transition of weightshift. After completing stairs, pt then reports that at home he usually descends backwards leading with his RLE instead of LLE. Will need to trial that in future session.  Focused on NMR for balance, coordination, and strength in standing with targeted toe taps to 4" block with LLE x 15 reps x 2 sets and x 20 reps on RLE. Cues for increased hip activation on LLE for improved clearance and utilized colored marker on step for target for  coordination of LLE placement. Continued to progress gait with focus on coordination with sidestepping to the L and R and retro gait with overall CGA with increased cues needed for walker placement when stepping to the R.   Administered TUG for fall risk assessment (see below).  Therapy Documentation Precautions:  Precautions Precautions: Fall, Other (comment) Precaution Comments: hx of CVA Lt hemi Required Braces or Orthoses: Other Brace Other Brace: has an AFO for Lt foot but has not been wearing much lately, not at hospital Restrictions Weight Bearing Restrictions Per Provider Order: No  Pain:  Denies pain.  Agitated Behavior Scale: TBI Observation Details Observation Environment: therapy Start of observation period - Date: 03/04/23 Start of observation period - Time: 1300 End of observation period - Date: 03/04/23 End of observation period - Time: 1358 Agitated Behavior Scale (DO NOT LEAVE BLANKS) Short attention span, easy distractibility, inability to concentrate: Absent Impulsive, impatient, low tolerance for pain or frustration: Absent Uncooperative, resistant to care, demanding: Absent Violent and/or threatening violence toward people or property: Absent Explosive and/or unpredictable anger: Absent Rocking, rubbing, moaning, or other self-stimulating behavior: Absent Pulling at tubes, restraints, etc.: Absent Wandering from treatment areas: Absent Restlessness, pacing, excessive movement: Absent Repetitive behaviors, motor, and/or verbal: Absent Rapid, loud, or excessive talking: Absent Sudden changes of mood: Absent Easily initiated or excessive crying and/or laughter: Absent Self-abusiveness, physical and/or verbal: Absent Agitated behavior scale total score: 14  Balance: Standardized Balance Assessment Standardized Balance Assessment: Timed Up and Go Test Timed Up and Go  Test TUG: Normal TUG Normal TUG (seconds): 63 (with RW)   Therapy/Group: Individual  Therapy  Karolee Stamps Darrol Poke, PT, DPT, CBIS  03/04/2023, 3:19 PM

## 2023-03-04 NOTE — Progress Notes (Signed)
Occupational Therapy Session Note  Patient Details  Name: Joseph Ortega MRN: [id number] Date of Birth: [birth year]  Today's Date: 03/04/2023 OT Individual Time: 1005-1100 OT Individual Time Calculation (min): 55 min    Short Term Goals: Week 1:  OT Short Term Goal 1 (Week 1): Pt will complete sit<>stands udring BADLs using LRAD with min A x1 OT Short Term Goal 2 (Week 1): Pt will complete stand pivot transfers for toileting and showers min A x1 OT Short Term Goal 3 (Week 1): Pt will tolerate standing at this sink during BADLs 2 minutes using LRAD OT Short Term Goal 4 (Week 1): Pt will complete UB dressing supervision  Skilled Therapeutic Interventions/Progress Updates:     Pt received siting up in bed presenting to be in good spirits receptive to skilled OT session reporting 3/10 pain in back- OT offering intermittent rest breaks, repositioning, and therapeutic support to optimize participation in therapy session. Pt requesting to take shower this session- focused session on BADL retraining to increase independence and safety during BADLs. Pt completed functional mobility and functional transfers using RW throughout session with min A and mod verbal cues for safety and L foot placement. Pt with decreased step length and foot height and intermittently dragging L foot during mobility. Pt able to complete sit <> stands with min A during session +increased time as Pt is extremely slow to power up into standing position. Pt completed toileting prior to shower completing 3/3 toileting task with light min A required for balance when completing clothing management and posterior peri-care in standing position with L UE supported on RW for balance. Pt completed ambulatory transfer into walking shower using grab bars with min A required for balance and to facilitate full step with L foot. Pt completed UB bathing in seated position with min A to wash R UE and underarm. Pt stood while holding grab bars and  maintained standing balance while OT assisted with washing buttocks- pt able to complete all other LB bathing tasks with supervision in seated position. Following shower, Pt completed U/LB dressing seated in w/c using hemi-techniques and reacher. Pt able to donn shirt with min A provided when weaving L UE and LB dressing min A to bring pants to waist to fully adjust over his L hip using RW to maintain standing position. Pt able to utilize reacher to weave B LEs into underwear and pants, however significantly increased amount of time required to complete task and mod verbal cues d/t new learning experience- pt would benefit from additional education and opportunity to practice skill. Donned socks and shoes total A for time management. Pt was left resting in wc with call bell in reach, seatbelt alarm on, and all needs met.    Therapy Documentation Precautions:  Precautions Precautions: Fall, Other (comment) Precaution Comments: hx of CVA Lt hemi Required Braces or Orthoses: Other Brace Other Brace: has an AFO for Lt foot but has not been wearing much lately, not at hospital Restrictions Weight Bearing Restrictions Per Provider Order: No   Therapy/Group: Individual Therapy  Clide Deutscher 03/04/2023, 7:54 AM
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
## 2023-03-04 NOTE — Progress Notes (Signed)
PROGRESS NOTE   Subjective/Complaints: No c/o, discussed low K+, pt without HA   ROS-   Pt denies SOB, abd pain, CP, N/V/C/D, and vision changes  Except for HPI is negative   No results found. Recent Labs    03/04/23 0530  WBC 4.9  HGB 13.4  HCT 38.5*  PLT 214    Recent Labs    03/04/23 0530  NA 136  K 3.0*  CL 104  CO2 27  GLUCOSE 131*  BUN 18  CREATININE 0.81  CALCIUM 8.6*     Intake/Output Summary (Last 24 hours) at 03/04/2023 0950 Last data filed at 03/04/2023 0759 Gross per 24 hour  Intake 1080 ml  Output 1650 ml  Net -570 ml        Physical Exam: Vital Signs Blood pressure 130/66, pulse (!) 58, temperature 98 F (36.7 C), resp. rate 17, weight 104.8 kg, SpO2 99%.   General: No acute distress Mood and affect are appropriate Heart: Regular rate and rhythm no rubs murmurs or extra sounds Lungs: Clear to auscultation, breathing unlabored, no rales or wheezes Abdomen: Positive bowel sounds, soft nontender to palpation, nondistended Extremities: No clubbing, cyanosis, or edema Skin: No evidence of breakdown, no evidence of rash    Neurologic:, motor strength is 5/5 in RIght and 3- left  deltoid, bicep, tricep, grip, hip flexor, knee extensors, ankle dorsiflexor and plantar flexor  Musculoskeletal:Mild pain with R knee ROM  Assessment/Plan: 1. Functional deficits which require 3+ hours per day of interdisciplinary therapy in a comprehensive inpatient rehab setting. Physiatrist is providing close team supervision and 24 hour management of active medical problems listed below. Physiatrist and rehab team continue to assess barriers to discharge/monitor patient progress toward functional and medical goals  Care Tool:  Bathing    Body parts bathed by patient: Left arm, Abdomen, Chest, Front perineal area, Right upper leg, Left upper leg, Face   Body parts bathed by helper: Left lower leg,  Right lower leg, Right arm, Left arm, Buttocks     Bathing assist Assist Level: Moderate Assistance - Patient 50 - 74%     Upper Body Dressing/Undressing Upper body dressing   What is the patient wearing?: Pull over shirt    Upper body assist Assist Level: Moderate Assistance - Patient 50 - 74%    Lower Body Dressing/Undressing Lower body dressing      What is the patient wearing?: Incontinence brief, Pants     Lower body assist Assist for lower body dressing: Total Assistance - Patient < 25%     Toileting Toileting Toileting Activity did not occur Press photographer and hygiene only): N/A (no void or bm)  Toileting assist Assist for toileting: Dependent - Patient 0% (condom catheter)     Transfers Chair/bed transfer  Transfers assist  Chair/bed transfer activity did not occur: Safety/medical concerns  Chair/bed transfer assist level: Contact Guard/Touching assist Chair/bed transfer assistive device: Armrests, Walker   Locomotion Ambulation   Ambulation assist   Ambulation activity did not occur: Safety/medical concerns (requires skilled assist and +2 w/c follow)  Assist level: Contact Guard/Touching assist Assistive device: Walker-rolling Max distance: 15ft   Walk 10 feet activity  Assist  Walk 10 feet activity did not occur: Safety/medical concerns  Assist level: Contact Guard/Touching assist Assistive device: Walker-rolling   Walk 50 feet activity   Assist Walk 50 feet with 2 turns activity did not occur: Safety/medical concerns  Assist level: Contact Guard/Touching assist Assistive device: Walker-rolling    Walk 150 feet activity   Assist Walk 150 feet activity did not occur: Safety/medical concerns         Walk 10 feet on uneven surface  activity   Assist Walk 10 feet on uneven surfaces activity did not occur: Safety/medical concerns         Wheelchair     Assist Is the patient using a wheelchair?: Yes Type of  Wheelchair: Manual (TIS w/c)    Wheelchair assist level: Dependent - Patient 0%      Wheelchair 50 feet with 2 turns activity    Assist        Assist Level: Dependent - Patient 0%   Wheelchair 150 feet activity     Assist      Assist Level: Dependent - Patient 0%   Blood pressure 130/66, pulse (!) 58, temperature 98 F (36.7 C), resp. rate 17, weight 104.8 kg, SpO2 99%.  Medical Problem List and Plan: 1. 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  Antithrombotics: -DVT/anticoagulation:  Pharmaceutical: Lovenox added per discussion with NS.              -antiplatelet therapy:  Hold ASA 3. Pain Management: back and left knee pain since fall             --Headaches worse but managed with tramadol  12/14- having back and  neck pain- since fall- doesn't like opiates since makes him fuzzy, so only taking tylenol during day- tramadol at night.  4. Mood/Behavior/Sleep: LCSW to follow for evaluation and support.              -antipsychotic agents: N/A 5. Neuropsych/cognition: This patient is capable of making decisions on his own behalf. 6. Skin/Wound Care: Routine pressure relief measures             --will add Vitamin C/Zincd 7. Fluids/Electrolytes/Nutrition: Monitor  I/O. Check CMET in am.     Latest Ref Rng & Units 03/04/2023    5:30 AM 02/28/2023    6:23 AM 02/26/2023    6:08 AM  BMP  Glucose 70 - 99 mg/dL 914  782  956   BUN 8 - 23 mg/dL 18  22  24    Creatinine 0.61 - 1.24 mg/dL 2.13  0.86  5.78   Sodium 135 - 145 mmol/L 136  138  135   Potassium 3.5 - 5.1 mmol/L 3.0  3.5  3.9   Chloride 98 - 111 mmol/L 104  103  99   CO2 22 - 32 mmol/L 27  30  31    Calcium 8.9 - 10.3 mg/dL 8.6  8.6  8.6     8. T2DM poorly controlled: Hgb A1c-10.1--was 8.6 10/2022. Tends to miss insulin doses per reports             --was on Levemir 40 u  pm, trulicity 3 mg/wk, Novolog TID, Farxiaga 10 mg             --  monitor BS ac/hs and titrate Levemir to home dose.   CBG (last 3)  Recent Labs    03/03/23 1637 03/03/23 2107 03/04/23 0632  GLUCAP 182* 218* 128*  Increase Semglee to 20U   Add novolog 3U TID   9. HTN: Monitor BP TID. Continue Coreg bid and hydrochlorothiazide Vitals:   03/04/23 0512 03/04/23 0722  BP: (!) 140/74 130/66  Pulse: (!) 54 (!) 58  Resp: 17   Temp: 98 F (36.7 C)   SpO2: 99%    BP control improving  10. Acute on chronic headaches:Improved on Topamax 11.  ABLA/Pelvic hematoma: Monitor H/H serially. Recheck CBC in am    Latest Ref Rng & Units 03/04/2023    5:30 AM 02/28/2023    6:23 AM 02/26/2023    6:08 AM  CBC  WBC 4.0 - 10.5 K/uL 4.9  5.9  7.5   Hemoglobin 13.0 - 17.0 g/dL 08.6  57.8  46.9   Hematocrit 39.0 - 52.0 % 38.5  36.0  37.7   Platelets 150 - 400 K/uL 214  173  171     12. Lymphedema w/stasis ulcer LLE: Daily wound care with silver Hydrofiber for moisture control and antimicrobial effect.  13. OSA: Continue CPAP.  14.  GB wall thickening: No signs of cholecystitis 15. Anxiety/Depression: controlled on buspar and Cymbalta  (intergrated psychology)  16. Tinea pedis left: Continue Clotrimazole for local measures.  17. Constipation: Will start Miralax 34 g today followed by 17 grams daily. Pt states this is not as good as pericolace which he takes at home will increase senna S    12/14- LBM yesterday  12/15- LBM this AM- incontinent which I hear from pt and staff is unusual for him 18. Stage II between buttocks- B/L inner buttocks  12/15- d/w charge nurse=- they will get picture and start barrier cream and monitor-       LOS: 7 days A FACE TO FACE EVALUATION WAS PERFORMED  Erick Colace 03/04/2023, 9:50 AM

## 2023-03-04 NOTE — Progress Notes (Signed)
Speech Language Pathology Daily Session Note  Patient Details  Name: Michah Schou MRN: [id number] Date of Birth: Jun 05, 1955  Today's Date: 03/04/2023 SLP Individual Time: 1415-1500 SLP Individual Time Calculation (min): 45 min  Short Term Goals: Week 1: SLP Short Term Goal 1 (Week 1): Pt will selectively attend to structured and/or functional tasks in a mildly distracting environment for 10 minutes given min A SLP Short Term Goal 2 (Week 1): Pt will recall novel information given a 5-7 minute delay with 80% acc with min A SLP Short Term Goal 3 (Week 1): Pt will complete moderately complex problem solving with 80% acc with min A SLP Short Term Goal 4 (Week 1): Patient will identify two cognitive and two physical changes since admission given min multimodal A  Skilled Therapeutic Interventions: SLP conducted skilled therapy session targeting cognitive retraining goals. Patient oriented to date independently and then participated in 40 minute functional task in mildly distracting environment given min assist to selectively attend to task. Patient recalled cards in play during the game with min assist overall and recalled rules of the game after 5-7 minute delay with 80% accuracy given min assist. Patient identified 2 broad cognitive deficits independently but required min-mod verbal cues to be more specific. States 1 physical change with supervision. Patient was left in chair with call bell in reach and chair alarm set. SLP will continue to target goals per plan of care.       Pain Pain Assessment Pain Scale: 0-10 Pain Score: 0-No pain  Therapy/Group: Individual Therapy  Jeannie Done, M.A., CCC-SLP  Yetta Barre 03/04/2023, 4:37 PM

## 2023-03-04 NOTE — Progress Notes (Signed)
Physical Therapy Session Note  Patient Details  Name: Joseph Ortega MRN: [id number] Date of Birth: May 09, 1955  Today's Date: 03/04/2023 PT Individual Time: 1500-1535 PT Individual Time Calculation (min): 35 min   Short Term Goals: Week 1:  PT Short Term Goal 1 (Week 1): Pt will perform supine<>sit using bed features as needed with min A PT Short Term Goal 2 (Week 1): Pt will perform sit<>stands using LRAD with min A PT Short Term Goal 3 (Week 1): Pt will perform bed<>chair transfers using LRAD with min A PT Short Term Goal 4 (Week 1): Pt will ambulate at least 28ft using LRAD with min A PT Short Term Goal 5 (Week 1): Pt will initiate stair navigation training  Skilled Therapeutic Interventions/Progress Updates:    Pt recd in TIS. Reports unrated knee pain during session, rest and positioning as needed. Sit to stand with CGA and increased time, min a at end of session d/t fatigue. Pt ambulated to/from day room with CGA. Cueing for increased LUE control of RW, note that pt ends up twisted slightly in RW, but able to partially correct with cueing. Pt with intermittent LLE foot drag and hip external rotation related to previous CVA's. Session focused on dynamic gait, including figure 8's around cones, stepping backwards, side stepping. Intermittent rest breaks for fatigue management. CGA overall for dynamic gait with cueing for safety with RW at times. Pt returned to bed with supervision, was left with all needs in reach and alarm active.   Therapy Documentation Precautions:  Precautions Precautions: Fall, Other (comment) Precaution Comments: hx of CVA Lt hemi Required Braces or Orthoses: Other Brace Other Brace: has an AFO for Lt foot but has not been wearing much lately, not at hospital Restrictions Weight Bearing Restrictions Per Provider Order: No General:       Therapy/Group: Individual Therapy  Juluis Rainier 03/04/2023, 3:47 PM

## 2023-03-05 DIAGNOSIS — F4323 Adjustment disorder with mixed anxiety and depressed mood: Secondary | ICD-10-CM | POA: Diagnosis not present

## 2023-03-05 DIAGNOSIS — M961 Postlaminectomy syndrome, not elsewhere classified: Secondary | ICD-10-CM | POA: Diagnosis not present

## 2023-03-05 DIAGNOSIS — K5901 Slow transit constipation: Secondary | ICD-10-CM | POA: Diagnosis not present

## 2023-03-05 DIAGNOSIS — S069X1D Unspecified intracranial injury with loss of consciousness of 30 minutes or less, subsequent encounter: Secondary | ICD-10-CM | POA: Diagnosis not present

## 2023-03-05 LAB — GLUCOSE, CAPILLARY
Glucose-Capillary: 101 mg/dL — ABNORMAL HIGH (ref 70–99)
Glucose-Capillary: 156 mg/dL — ABNORMAL HIGH (ref 70–99)
Glucose-Capillary: 113 mg/dL — ABNORMAL HIGH (ref 70–99)
Glucose-Capillary: 155 mg/dL — ABNORMAL HIGH (ref 70–99)

## 2023-03-05 NOTE — Progress Notes (Addendum)
PROGRESS NOTE   Subjective/Complaints: No issues , working with OT this am no pain c/os   ROS-   Pt denies SOB, abd pain, CP, N/V/C/D, and vision changes  Except for HPI is negative   No results found. Recent Labs    03/04/23 0530  WBC 4.9  HGB 13.4  HCT 38.5*  PLT 214    Recent Labs    03/04/23 0530  NA 136  K 3.0*  CL 104  CO2 27  GLUCOSE 131*  BUN 18  CREATININE 0.81  CALCIUM 8.6*     Intake/Output Summary (Last 24 hours) at 03/05/2023 0900 Last data filed at 03/05/2023 0737 Gross per 24 hour  Intake 580 ml  Output 1500 ml  Net -920 ml        Physical Exam: Vital Signs Blood pressure 133/71, pulse (!) 50, temperature 98.8 F (37.1 C), resp. rate 17, weight 104 kg, SpO2 97%.   General: No acute distress Mood and affect are appropriate Heart: Regular rate and rhythm no rubs murmurs or extra sounds Lungs: Clear to auscultation, breathing unlabored, no rales or wheezes Abdomen: Positive bowel sounds, soft nontender to palpation, nondistended Extremities: No clubbing, cyanosis, or edema Skin: No evidence of breakdown, no evidence of rash    Neurologic:, motor strength is 5/5 in RIght and 3- left  deltoid, bicep, tricep, grip, hip flexor, knee extensors, ankle dorsiflexor and plantar flexor  Musculoskeletal:Mild pain with R knee ROM  Assessment/Plan: 1. Functional deficits which require 3+ hours per day of interdisciplinary therapy in a comprehensive inpatient rehab setting. Physiatrist is providing close team supervision and 24 hour management of active medical problems listed below. Physiatrist and rehab team continue to assess barriers to discharge/monitor patient progress toward functional and medical goals  Care Tool:  Bathing    Body parts bathed by patient: Left arm, Abdomen, Chest, Front perineal area, Right upper leg, Left upper leg, Face   Body parts bathed by helper: Left  lower leg, Right lower leg, Right arm, Left arm, Buttocks     Bathing assist Assist Level: Moderate Assistance - Patient 50 - 74%     Upper Body Dressing/Undressing Upper body dressing   What is the patient wearing?: Pull over shirt    Upper body assist Assist Level: Moderate Assistance - Patient 50 - 74%    Lower Body Dressing/Undressing Lower body dressing      What is the patient wearing?: Incontinence brief, Pants     Lower body assist Assist for lower body dressing: Total Assistance - Patient < 25%     Toileting Toileting Toileting Activity did not occur Press photographer and hygiene only): N/A (no void or bm)  Toileting assist Assist for toileting: Dependent - Patient 0% (condom catheter)     Transfers Chair/bed transfer  Transfers assist  Chair/bed transfer activity did not occur: Safety/medical concerns  Chair/bed transfer assist level: Contact Guard/Touching assist Chair/bed transfer assistive device: Armrests, Walker   Locomotion Ambulation   Ambulation assist   Ambulation activity did not occur: Safety/medical concerns (requires skilled assist and +2 w/c follow)  Assist level: Contact Guard/Touching assist Assistive device: Walker-rolling Max distance: 120'   Walk 10 feet  activity   Assist  Walk 10 feet activity did not occur: Safety/medical concerns  Assist level: Contact Guard/Touching assist Assistive device: Walker-rolling   Walk 50 feet activity   Assist Walk 50 feet with 2 turns activity did not occur: Safety/medical concerns  Assist level: Contact Guard/Touching assist Assistive device: Walker-rolling    Walk 150 feet activity   Assist Walk 150 feet activity did not occur: Safety/medical concerns         Walk 10 feet on uneven surface  activity   Assist Walk 10 feet on uneven surfaces activity did not occur: Safety/medical concerns         Wheelchair     Assist Is the patient using a wheelchair?:  Yes Type of Wheelchair: Manual (TIS w/c)    Wheelchair assist level: Dependent - Patient 0%      Wheelchair 50 feet with 2 turns activity    Assist        Assist Level: Dependent - Patient 0%   Wheelchair 150 feet activity     Assist      Assist Level: Dependent - Patient 0%   Blood pressure 133/71, pulse (!) 50, temperature 98.8 F (37.1 C), resp. rate 17, weight 104 kg, SpO2 97%.  Medical Problem List and Plan: 1. 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  Antithrombotics: -DVT/anticoagulation:  Pharmaceutical: Lovenox added per discussion with NS.              -antiplatelet therapy:  Hold ASA 3. Pain Management: back and left knee pain since fall             --Headaches worse but managed with tramadol  12/14- having back and  neck pain- since fall- doesn't like opiates since makes him fuzzy, so only taking tylenol during day- tramadol at night.  4. Mood/Behavior/Sleep: LCSW to follow for evaluation and support.              -antipsychotic agents: N/A 5. Neuropsych/cognition: This patient is capable of making decisions on his own behalf. 6. Skin/Wound Care: Routine pressure relief measures             --will add Vitamin C/Zincd 7. Fluids/Electrolytes/Nutrition: Monitor  I/O. Check CMET in am.     Latest Ref Rng & Units 03/04/2023    5:30 AM 02/28/2023    6:23 AM 02/26/2023    6:08 AM  BMP  Glucose 70 - 99 mg/dL 295  621  308   BUN 8 - 23 mg/dL 18  22  24    Creatinine 0.61 - 1.24 mg/dL 6.57  8.46  9.62   Sodium 135 - 145 mmol/L 136  138  135   Potassium 3.5 - 5.1 mmol/L 3.0  3.5  3.9   Chloride 98 - 111 mmol/L 104  103  99   CO2 22 - 32 mmol/L 27  30  31    Calcium 8.9 - 10.3 mg/dL 8.6  8.6  8.6     8. T2DM poorly controlled: Hgb A1c-10.1--was 8.6 10/2022. Tends to miss insulin doses per reports              --was on Levemir 40 u pm, trulicity 3 mg/wk, Novolog TID, Farxiaga 10 mg             --  monitor BS ac/hs and titrate Levemir to home dose.   CBG (last 3)  Recent Labs    03/04/23 1714 03/04/23 2120 03/05/23 0627  GLUCAP 144* 199* 101*  Increase Semglee to 20U   Add novolog 3U TID   9. HTN: Monitor BP TID. Continue Coreg bid and hydrochlorothiazide Vitals:   03/05/23 0452 03/05/23 0531  BP: 133/71   Pulse: (!) 51 (!) 50  Resp: 17   Temp: 98.8 F (37.1 C)   SpO2: 97%    BP control improving  10. Acute on chronic headaches:Improved on Topamax 11.  ABLA/Pelvic hematoma: Monitor H/H serially. Recheck CBC in am    Latest Ref Rng & Units 03/04/2023    5:30 AM 02/28/2023    6:23 AM 02/26/2023    6:08 AM  CBC  WBC 4.0 - 10.5 K/uL 4.9  5.9  7.5   Hemoglobin 13.0 - 17.0 g/dL 82.9  56.2  13.0   Hematocrit 39.0 - 52.0 % 38.5  36.0  37.7   Platelets 150 - 400 K/uL 214  173  171     12. Lymphedema w/stasis ulcer LLE: Daily wound care with silver Hydrofiber for moisture control and antimicrobial effect.  13. OSA: Continue CPAP.  14.  GB wall thickening: No signs of cholecystitis 15. Anxiety/Depression: controlled on buspar and Cymbalta  (intergrated psychology)  16. Tinea pedis left: Continue Clotrimazole for local measures.  17. Constipation: Will start Miralax 34 g today followed by 17 grams daily. Pt states this is not as good as pericolace which he takes at home will increase senna S    12/14- LBM yesterday  12/15- LBM this AM- incontinent which I hear from pt and staff is unusual for him 18. Stage II between buttocks- B/L inner buttocks  12/15- d/w charge nurse=- they will get picture and start barrier cream and monitor-       LOS: 8 days A FACE TO FACE EVALUATION WAS PERFORMED  Erick Colace 03/05/2023, 9:00 AM

## 2023-03-05 NOTE — Progress Notes (Signed)
Pt will call around 10pm. Unit set as per Pulmonary note 5/22 BiPAP 16/12   03/04/23 2140  BiPAP/CPAP/SIPAP  BiPAP/CPAP/SIPAP Pt Type Adult  BiPAP/CPAP/SIPAP Resmed  Mask Type Full face mask  IPAP  (max 18)  EPAP  (min 12)  Pressure Support 4 cmH20  FiO2 (%) 21 %  Auto Titrate Yes

## 2023-03-05 NOTE — Progress Notes (Signed)
Occupational Therapy Weekly Progress Note  Patient Details  Name: Joseph Ortega MRN: [id number] Date of Birth: July 15, 1955  Beginning of progress report period: February 26, 2023 End of progress report period: March 05, 2023  Today's Date: 03/05/2023 OT Individual Time: 7829-5621 OT Individual Time Calculation (min): 36 min  OT Individual Time: 3086-5784 OT Individual Time Calculation (min): 70 min   Patient has met 4 of 4 short term goals.  [name] is making significant progress towards reaching his LTGS. He is completing U/LB bathing with min A, UB dressing supervision, LB dressing CGA using a reacher to weave B LEs into pants, and toileting CGA using RW. Pt is completing toilet and shower transfers using RW and grab bars with CGA.   Patient continues to demonstrate the following deficits: muscle weakness and muscle joint tightness, decreased cardiorespiratoy endurance, unbalanced muscle activation, decreased coordination, and decreased motor planning, decreased attention, central origin, and decreased sitting balance, decreased standing balance, decreased postural control, hemiplegia, and decreased balance strategies and therefore will continue to benefit from skilled OT intervention to enhance overall performance with BADL, iADL, and Reduce care partner burden.  Patient progressing toward long term goals..  Continue plan of care.  OT Short Term Goals Week 1:  OT Short Term Goal 1 (Week 1): Pt will complete sit<>stands udring BADLs using LRAD with min A x1 OT Short Term Goal 1 - Progress (Week 1): Met OT Short Term Goal 2 (Week 1): Pt will complete stand pivot transfers for toileting and showers min A x1 OT Short Term Goal 2 - Progress (Week 1): Met OT Short Term Goal 3 (Week 1): Pt will tolerate standing at this sink during BADLs 2 minutes using LRAD OT Short Term Goal 3 - Progress (Week 1): Met OT Short Term Goal 4 (Week 1): Pt will complete UB dressing supervision OT Short Term  Goal 4 - Progress (Week 1): Met Week 2:  OT Short Term Goal 1 (Week 2): STG=LTG d/t ELOS  Skilled Therapeutic Interventions/Progress Updates:     AM Session:  Pt received semi-reclined in bed presenting to be in good spirits receptive to skilled OT session reporting 0/10 pain- OT offering intermittent rest breaks, repositioning, and therapeutic support to optimize participation in therapy session. MD in/out at beginning of session for morning rounding. Focus this session BADL retraining to increase independence in preparation for d/c home. Pt transitioned to EOB using bed features with supervision +increased time. Pt requesting to use restroom this AM. Engaged Pt in completing functional mobility to BR using RW with CGA provided for balance and min verbal cues for L foot placement and step height. Pt transferred to [facility] positioned over toilet using grab bar and RW with CGA +increased time. Provided Pt prolonged period of time on toilet d/t need for BM- continent BM and void documented in flowsheets. Pt requesting to change into clean pants with Pt completing LB dressing using hemi-techniques while seated on toilet. D/t set-up of environment, assistance provided to adjust pants over feet to increase Pt's safety. Pt stood using grab bar and RW to complete posterior per-care with Pt able to complete 3/3 toileting tasks this session with CGA +increased time. PT entering room while Pt was ambulating out of the BR using RW with CGA provided for balance- Pt handed off to PT at end of session with all BADL needs met.   PM Session:  Pt received sitting up in recliner, dressed and ready for the day. Pt politely declining need for shower  this session. Pt presenting to be in good spirits receptive to skilled OT session reporting 3/10 pain in L hip- OT offering intermittent rest breaks, repositioning, and therapeutic support to optimize participation in therapy session. RN informed of Pt's pain with RN in/out to provide  medications during session. Focus this session BADL retraining, L UE NMRE, activity tolerance, and dynamic balance.  Pt requesting to use urinal at beginning of session. Pt required increased time to scoot to edge of recliner and to power up into standing position d/t low seat position. Sit > stand using RW CGA. Pt able to maintain standing balance and use urinal with CGA with L UE supported on RW. Pt then urgently reporting need for BM. Engaged Pt in completing functional mobility to BR using RW with CGA provided for balance and mod verbal cues for L foot placement and body positioning in RW. Provided increased time on toilet d/t need for BM- continent B&B documented in flowsheets. Pt able to complete 3/3 toileting tasks CGA this session using RW for balance.   Pt completed functional mobility to bed room using RW and stood at sink to wash hands with close supervision. Pt chose to sit in wc to complete oral care for energy conservation and to simulate how he completes this skill at home. Pt able to complete oral care and wash face with set-up assist +increased time.   Engaged Pt in completing functional mobility to/from therapy gym this session using RW for endurance training and to increase safety and independence upon d/c. Pt able to ambulate >150 ft with close supervision to CGA with min verbal cues required for L foot placement and body positioning as Pt tends to push his RW more with his R UE > L UE.   Sitting EOM, engaged Pt in gentle B UE stretches to increase ROM and maintain L UE ROM to allow for improved functional use during BADLs. Guided Pt through gentle chest opener exercises in combination with deep breathing. L UE positioned on mat with Pt instructed to slowly rotate towards and away from L UE with OT stabilizing L hand to maintain position- pt reporting relief in pectoralis/deltoid tightness following.   Pt completed lateral leans onto L UE sitting EOM while using R UE to reach across  midline to retrieve bean bags positioned on Pt's L side to facilitate WB'ing into L UE and close chain tricep and elbow stabilization for improved functional use during BADLs. Pt able to complete 2x10 reps with min A to stabilize L UE and mod verbal cues for technique.   Pt completed step taps to cone while standing in front of large vertical mirror with B UEs supported on RW to work on single leg balance, step height, and L LE coordination. Pt completed 1x10 reps with CGA to light min A provided for balance with Pt heavily relying on B UE support and requiring increased assistance when balancing on L LE. Pt fatigued following and requesting to return to bed.   EOB>supine using bed features supervision +increased time. Pt was left resting in bed with call bell in reach, bed alarm on, and all needs met.    Therapy Documentation Precautions:  Precautions Precautions: Fall, Other (comment) Precaution Comments: hx of CVA Lt hemi Required Braces or Orthoses: Other Brace Other Brace: has an AFO for Lt foot but has not been wearing much lately, not at hospital Restrictions Weight Bearing Restrictions Per Provider Order: No  Therapy/Group: Individual Therapy  Clide Deutscher 03/05/2023, 8:01  AM

## 2023-03-05 NOTE — Progress Notes (Signed)
Physical Therapy Weekly Progress Note  Patient Details  Name: Joseph Ortega MRN: [id number] Date of Birth: [birth year]  Beginning of progress report period: February 26, 2023 End of progress report period: March 05, 2023  Today's Date: 03/05/2023 PT Individual Time: 0935-1050 PT Individual Time Calculation (min): 75 min   Patient has met 5 of 5 short term goals.  Mr. Artino is progressing well with therapy demonstrating increasing independence with functional mobility; however, continues to have greatest deficits of chronic L hemiparesis and abnormal tone in L hemibody impacting his balance with functional mobility. He is performing supine<>sit relying on bed features with min A, sit<>stands and stand pivot transfers using RW with CGA/SBA, gait training up to ~135ft using RW with CGA, and participating in stair navigation training using unilateral UE support to simulate home set-up with CGA. He will benefit from continued CIR level skilled physical therapy to further progress his independence with functional mobility prior to D/C home at goal of mod-I level due to his wife being unable to support him as she is at a wheelchair level.  Patient continues to demonstrate the following deficits muscle weakness, muscle joint tightness, and muscle paralysis, decreased cardiorespiratoy endurance, impaired timing and sequencing, abnormal tone, and unbalanced muscle activation,  , and decreased sitting balance, decreased standing balance, decreased postural control, and decreased balance strategies and therefore will continue to benefit from skilled PT intervention to increase functional independence with mobility.  Patient progressing toward long term goals..  Continue plan of care.  PT Short Term Goals Week 1:  PT Short Term Goal 1 (Week 1): Pt will perform supine<>sit using bed features as needed with min A PT Short Term Goal 1 - Progress (Week 1): Met PT Short Term Goal 2 (Week 1): Pt will perform  sit<>stands using LRAD with min A PT Short Term Goal 2 - Progress (Week 1): Met PT Short Term Goal 3 (Week 1): Pt will perform bed<>chair transfers using LRAD with min A PT Short Term Goal 3 - Progress (Week 1): Met PT Short Term Goal 4 (Week 1): Pt will ambulate at least 16ft using LRAD with min A PT Short Term Goal 4 - Progress (Week 1): Met PT Short Term Goal 5 (Week 1): Pt will initiate stair navigation training PT Short Term Goal 5 - Progress (Week 1): Met Week 2:  PT Short Term Goal 1 (Week 2): = to LTGs based on ELOS  Skilled Therapeutic Interventions/Progress Updates:  Ambulation/gait training;Community reintegration;DME/adaptive equipment instruction;Neuromuscular re-education;Psychosocial support;Stair training;UE/LE Strength taining/ROM;Wheelchair propulsion/positioning;Balance/vestibular training;Discharge planning;Functional electrical stimulation;Pain management;Skin care/wound management;Therapeutic Activities;UE/LE Coordination activities;Cognitive remediation/compensation;Disease management/prevention;Functional mobility training;Patient/family education;Splinting/orthotics;Therapeutic Exercise;Visual/perceptual remediation/compensation   Pt received as hand-off from OT walking out of bathroom. Gait training ~39ft to sink using RW with CGA - pt demoing worsening L anterior trunk flexion and L hip rotated posteriorly compared to R in this short distance household level navigation. Standing at sink completed hand hygiene with CGA/SBA for safety.    Transported to/from gym in w/c for time management and energy conservation.  Sit<>stands using RW with CGA progressed to SBA during session - continues to use R hemibody to manage L hemibody placement in preparation for transfer without cuing.  Gait training 167ft using RW with CGA assist for steadying. Pt demonstrating the following gait deviations with therapist providing the described cuing and facilitation for improvement:  -  therapist cuing for increased L glute activation during stance phase to promote improved upright trunk posture and step-through pattern with R LE - continues  to have trunk/pelvis minimally rotated towards L with L hip posterior to R - AD veers slightly towards R due to L UE paresis and tone - L LE poor hip/knee flexion for foot clearance during swing but adequate for achieving reciprocal pattern   Stair navigation training ascending/descending 3 steps x2 (6" height) using R HR to simulate home set-up (but after 1st set realized his [place] is actually on the L side and up high, but unable to truly replicate to simulate) with CGA for safety - step-to pattern leading forward with R LE on ascent and then backwards with varying L LE vs R LE on descent - no LE instability noted throughout, but pt just moves slow to maintain balance.  Dynamic standing balance and LLE NMR task focusing on increasing L hip abductor and glute activation for improved stance phase control  - L UE support on RW during cross body reaching with R UE from L side to upper R side with focus on stretching and moving out of the L forward trunk flexed posture - x8 reps to place horseshoes on basketball goal - requries min A for balance   Gait to // bars performed R/L lateral side stepping over walking stick to promote increased hip abductor activation and increased L lateral step length - x5 reps with pt having difficulty achieving large enough step.  Gait training ~127ft back to his room using RW with CGA and cuing as described above.  Pt left seated in recliner with needs in reach and chair alarm on.    Therapy Documentation Precautions:  Precautions Precautions: Fall, Other (comment) Precaution Comments: hx of CVA Lt hemi Required Braces or Orthoses: Other Brace Other Brace: has an AFO for Lt foot but has not been wearing much lately, not at hospital Restrictions Weight Bearing Restrictions Per Provider Order:  No   Pain:  Reports min R low back pain during side stepping activity, but does not impact participation and no intervention needed at this time.   Therapy/Group: Individual Therapy  Ginny Forth , PT, DPT, NCS, CSRS 03/05/2023, 7:59 AM

## 2023-03-05 NOTE — Progress Notes (Signed)
Pt tol well   03/05/23 0030  BiPAP/CPAP/SIPAP  $ Non-Invasive Home Ventilator  Subsequent  BiPAP/CPAP/SIPAP Pt Type Adult  BiPAP/CPAP/SIPAP Resmed  Mask Type Full face mask  IPAP  (max 18)  EPAP  (min 12)  Pressure Support 4 cmH20  FiO2 (%) 21 %  Auto Titrate Yes

## 2023-03-05 NOTE — Plan of Care (Signed)
  Problem: RH Problem Solving Goal: LTG Patient will demonstrate problem solving for (SLP) Description: LTG:  Patient will demonstrate problem solving for basic/complex daily situations with cues  (SLP) Outcome: Completed/Met   Problem: RH Memory Goal: LTG Patient will demonstrate ability for day to day (SLP) Description: LTG:   Patient will demonstrate ability for day to day recall/carryover during cognitive/linguistic activities with assist  (SLP) Outcome: Completed/Met   Problem: RH Attention Goal: LTG Patient will demonstrate this level of attention during functional activites (SLP) Description: LTG:  Patient will will demonstrate this level of attention during functional activites (SLP) Outcome: Completed/Met   Problem: RH Awareness Goal: LTG: Patient will demonstrate awareness during functional activites type of (SLP) Description: LTG: Patient will demonstrate awareness during functional activites type of (SLP) Outcome: Completed/Met

## 2023-03-05 NOTE — Progress Notes (Signed)
Patient ID: Joseph Ortega, male   DOB: Sep 18, 1955, 67 y.o.   MRN: [id number]  No DME or follow up recommendations currently.

## 2023-03-05 NOTE — Plan of Care (Signed)
Behavioral Plan Guideline Note  Patient Details  Name: Joseph Ortega MRN: [id number] Date of Birth: 1956-01-13   Behavioral Plan:    [place] Level   N/A  Behavior to decrease/eliminate  (None)   Decrease pulling at lines/tubes       Decrease removal of orthoses      Improved adherence to precautions       Changes to environment Lights on, blinds open during the day, off and closed at night;Urinal at bedside     Minimize in room stimulation       Interventions Bed alarm setting     Restraints       Bed alarm setting Medium sensitivity     # of rails up       Therapy schedule adjustments       Recommendations for interactions with patient  (Standard safety precautions)    In Attendance at Behavior Plan Meeting OT;PT;SLP;RN       This behavior plan was developed with this patient's interdisciplinary team. It will be updated weekly on the day of conference or as needed with changing behaviors to keep the patient and staff safe. A copy of the Behavior plan will be posted in the patient room, at the RN station with the charge RN and in the patient medical record. If you have any questions or need clarification, please feel free to discuss with the interdisciplinary team mentioned above.  Jatavia Keltner M Sabryn Preslar 03/05/2023, 8:07 AM

## 2023-03-05 NOTE — Progress Notes (Addendum)
Speech Language Pathology Discharge Summary  Patient Details  Name: Joseph Ortega MRN: [id number] Date of Birth: 22-Mar-1955  Date of Discharge from SLP service:March 05, 2023  Today's Date: 03/05/2023 SLP Individual Time: 0800-0859 SLP Individual Time Calculation (min): 59 min   Skilled Therapeutic Interventions:   Skilled therapy session focused on cognitive goals. SLP facilitated session by initiating completion of memory task. Patient independently recalled activities completed in therapy sessions prior date and d/c plan. SLP targeted awareness goals as patient independently recalled cognitive and physical changes since admission. Patient reports improvements in walking and attention to tasks. SLP targeted problem solving and sustained attention goals during financial management task. Patient required mod I cues to count change and explain strategies to manage at home finances. Patient able to independently recall dates in which each bill was due and sustain attention throughout session. Patient reports he is at baseline level of cognitive functioning. Patient left in bed with alarm set and call bell in reach.    Patient has met 4 of 4 long term goals.  Patient to discharge at overall Supervision level.  Reasons goals not met: n/a   Clinical Impression/Discharge Summary:  Pt has made great gains and has met 4 of 4 LTG's this admission due to improved cognitive skills. Pt is currently an overall supervision A for cognitive tasks including awareness, attention, memory and problem solving. Patient continues to demonstrate deficits, though is now at b/l level of functioning. Pt/family education complete and pt will discharge home with supervision from friends/family/etc. Pt does not require follow up ST services at this time.    Care Partner:  Caregiver Able to Provide Assistance: Yes  Type of Caregiver Assistance: Physical;Cognitive  Recommendation:  None      Equipment: n/a    Reasons for discharge: Treatment goals met   Patient/Family Agrees with Progress Made and Goals Achieved: Yes    Deshay Kirstein M.A., CF-SLP 03/05/2023, 11:11 AM

## 2023-03-06 DIAGNOSIS — S069X1D Unspecified intracranial injury with loss of consciousness of 30 minutes or less, subsequent encounter: Secondary | ICD-10-CM | POA: Diagnosis not present

## 2023-03-06 DIAGNOSIS — K5901 Slow transit constipation: Secondary | ICD-10-CM | POA: Diagnosis not present

## 2023-03-06 DIAGNOSIS — F4323 Adjustment disorder with mixed anxiety and depressed mood: Secondary | ICD-10-CM | POA: Diagnosis not present

## 2023-03-06 DIAGNOSIS — M961 Postlaminectomy syndrome, not elsewhere classified: Secondary | ICD-10-CM | POA: Diagnosis not present

## 2023-03-06 LAB — GLUCOSE, CAPILLARY
Glucose-Capillary: 172 mg/dL — ABNORMAL HIGH (ref 70–99)
Glucose-Capillary: 90 mg/dL (ref 70–99)
Glucose-Capillary: 133 mg/dL — ABNORMAL HIGH (ref 70–99)
Glucose-Capillary: 140 mg/dL — ABNORMAL HIGH (ref 70–99)
Glucose-Capillary: 180 mg/dL — ABNORMAL HIGH (ref 70–99)

## 2023-03-06 MED ORDER — AMMONIUM LACTATE 12 % EX LOTN
TOPICAL_LOTION | CUTANEOUS | Status: DC | PRN
  Filled 2023-03-06: qty 225

## 2023-03-06 NOTE — Progress Notes (Signed)
Physical Therapy Session Note  Patient Details  Name: Joseph Ortega MRN: [id number] Date of Birth: April 07, 1955  Today's Date: 03/06/2023 PT Individual Time: 1000-1045 PT Individual Time Calculation (min): 45 min   Short Term Goals: Week 2:  PT Short Term Goal 1 (Week 2): = to LTGs based on ELOS  Skilled Therapeutic Interventions/Progress Updates: Pt presents sitting in TIS, reclined back and agreeable to therapy.  Pt transfers sit to stand w/ SBA and places L hand on walker.  Pt amb to laundry room to change over clothes to dryer.  Pt requires verbal cues for safety in confined space.  Pt amb to main gym w/ RW and CGA/ close supervision.  Pt negotiated 3 steps w/ R railing, although at home has hand rail to left side of opening.  Pt requires min A for step-to pattern.  Pt steps backward.  Pt amb to room and sitting in TIS tilted back and chair alarm on w/ all needs in reach.     Therapy Documentation Precautions:  Precautions Precautions: Fall, Other (comment) Precaution Comments: hx of CVA Lt hemi Required Braces or Orthoses: Other Brace Other Brace: has an AFO for Lt foot but has not been wearing much lately, not at hospital Restrictions Weight Bearing Restrictions Per Provider Order: No General:   Vital Signs:   Pain:0/10 Pain Assessment Pain Scale: 0-10 Pain Score: 3  Pain Type: Acute pain Pain Location: Knee Pain Orientation: Right;Left Pain Descriptors / Indicators: Sore Pain Frequency: Intermittent Pain Intervention(s): Medication (See eMAR) (voltaren gel)    Therapy/Group: Individual Therapy  [name] 03/06/2023, 10:46 AM

## 2023-03-06 NOTE — Plan of Care (Signed)
  Problem: Consults Goal: RH BRAIN INJURY PATIENT EDUCATION Description: Description: See Patient Education module for eduction specifics Outcome: Progressing   Problem: RH BOWEL ELIMINATION Goal: RH STG MANAGE BOWEL WITH ASSISTANCE Description: STG Manage Bowel with mod I Assistance. Outcome: Progressing Goal: RH STG MANAGE BOWEL W/MEDICATION W/ASSISTANCE Description: STG Manage Bowel with Medication with mod I  Assistance. Outcome: Progressing   Problem: RH BLADDER ELIMINATION Goal: RH STG MANAGE BLADDER WITH ASSISTANCE Description: STG Manage Bladder With mod I Assistance Outcome: Progressing   Problem: RH SAFETY Goal: RH STG ADHERE TO SAFETY PRECAUTIONS W/ASSISTANCE/DEVICE Description: STG Adhere to Safety Precautions With cues Assistance/Device. Outcome: Progressing   Problem: RH COGNITION-NURSING Goal: RH STG USES MEMORY AIDS/STRATEGIES W/ASSIST TO PROBLEM SOLVE Description: STG Uses Memory Aids/Strategies With cues Assistance to Problem Solve. Outcome: Progressing   Problem: RH PAIN MANAGEMENT Goal: RH STG PAIN MANAGED AT OR BELOW PT'S PAIN GOAL Description: < 4 with prns Outcome: Progressing   Problem: RH KNOWLEDGE DEFICIT BRAIN INJURY Goal: RH STG INCREASE KNOWLEDGE OF SELF CARE AFTER BRAIN INJURY Description: Patient and wife will be able to manage care at discharge using educational resources for medications and dietary modification independently Outcome: Progressing   Problem: RH KNOWLEDGE DEFICIT Goal: RH STG INCREASE KNOWLEDGE OF DIABETES Description: Patient and wife will be able to manage DM at discharge using educational resources for medications and dietary modification independently Outcome: Progressing Goal: RH STG INCREASE KNOWLEDGE OF HYPERTENSION Description: Patient and wife will be able to manage HTN at discharge using educational resources for medications and dietary modification independently Outcome: Progressing Goal: RH STG INCREASE KNOWLEGDE  OF HYPERLIPIDEMIA Description: Patient and wife will be able to manage HLD at discharge using educational resources for medications and dietary modification independently Outcome: Progressing   Problem: Education: Goal: Ability to describe self-care measures that may prevent or decrease complications (Diabetes Survival Skills Education) will improve Outcome: Progressing Goal: Individualized Educational Video(s) Outcome: Progressing   Problem: Coping: Goal: Ability to adjust to condition or change in health will improve Outcome: Progressing   Problem: Fluid Volume: Goal: Ability to maintain a balanced intake and output will improve Outcome: Progressing   Problem: Health Behavior/Discharge Planning: Goal: Ability to identify and utilize available resources and services will improve Outcome: Progressing Goal: Ability to manage health-related needs will improve Outcome: Progressing   Problem: Metabolic: Goal: Ability to maintain appropriate glucose levels will improve Outcome: Progressing   Problem: Nutritional: Goal: Maintenance of adequate nutrition will improve Outcome: Progressing Goal: Progress toward achieving an optimal weight will improve Outcome: Progressing   Problem: Skin Integrity: Goal: Risk for impaired skin integrity will decrease Outcome: Progressing   Problem: Tissue Perfusion: Goal: Adequacy of tissue perfusion will improve Outcome: Progressing   Problem: RH BOWEL ELIMINATION Goal: RH STG MANAGE BOWEL WITH ASSISTANCE Description: STG Manage Bowel with mod I Assistance. Outcome: Progressing   Problem: RH SAFETY Goal: RH STG ADHERE TO SAFETY PRECAUTIONS W/ASSISTANCE/DEVICE Description: STG Adhere to Safety Precautions With cues Assistance/Device. Outcome: Progressing   Problem: RH PAIN MANAGEMENT Goal: RH STG PAIN MANAGED AT OR BELOW PT'S PAIN GOAL Description: < 4 with prns Outcome: Progressing

## 2023-03-06 NOTE — Progress Notes (Signed)
Physical Therapy Session Note  Patient Details  Name: Joseph Ortega MRN: [id number] Date of Birth: [birth year]  Today's Date: 03/06/2023 PT Individual Time: 7253-6644 and 1401-1500 PT Individual Time Calculation (min): 42 min and 59 min  Short Term Goals: Week 2:  PT Short Term Goal 1 (Week 2): = to LTGs based on ELOS  Skilled Therapeutic Interventions/Progress Updates:    Session 1: Pt received supine in bed awake and eager to participate in therapy session. Reports the recliner was not as comfortable as the TIS w/c and that it caused his L hip to hurt, but states Tylenol helped alleviate his pain. Pt requesting to keep TIS w/c at this time to allow increased upright, OOB activity tolerance.   Supine>sitting R EOB, HOB flat and not using bedrails with supervision! Sitting EOB donned shoes set-up assist and doffed dirty shirt and donned clean shirt set-up assist. Pt demos ability to place L LE in figure-4 position to assist with donning shoe.   Sit>stand EOB>RW with SBA for safety. Gait training in room using RW to collect dirty clothes from bottom drawer, including picking up clothes from floor when he dropped them all with CGA for safety - pt has to move very slow to maintain balance and safety while using L UE support on RW. Notice pt often approaches items to grab on his R side to allow him to reach it with his R UE.  Gait training ~89ft to laundry room using RW while carrying bag of dirty clothes in R hand on RW handle with CGA - cuing again to activate L hip extensors and abductors for improved stance phase and upright posture during L stance - slow gait speed.   Able to place dirty clothes in washing machine with CGA for standing balance - pt again approaches the machine on his R side - educated him on option of approaching it directly forward to allow his L hand to support him on the stable machine.  Gait training ~59ft using RW as described above. Transitioned to gait training with +2 B  HHA to progress toward pt's goal of being able to participate in [facility] gait training prior to D/C - gait training ~57ft with B HHA and +2 min A for balance and pt's L hip strength deficits become more apparent during this requiring increased manual facilitation to improve L hip stability during stance.   At end of session, pt left sitting in TIS w/c with needs in reach and seat belt alarm on.  Session 2: Pt received sitting in TIS w/c and agreeable to therapy session. Sit<>stands using RW with supervision during session. Gait training ~28ft to laundry room using RW with CGA progressing to SBA for safety - pt demos ability to initiate self-correction for improved upright posture - continues to have L hip slightly posteriorly rotated, L knee in full extension during stance, no ankle DF activation, and min forward trunk flexed posture with L rotation bias. Standing with L UE support on RW able to retrieve clean clothes from dryer with pt having his back supported on the propped door for balance stability - uses reacher to grasp far items - CGA/SBA for safety but  no balance instability. Continues to have slow movements to maintain his balance. Gait training back to room as described above with bag of clothes tied to his RW.  Transported to/from main gym in w/c for time management and energy conservation.  In // bars performed the following dynamic gait challenges:  - R and  L lateral side stepping with B UE support progressing to only L UE support with min A for facilitating improved L hip alignment to activate hip extensors during stance, down/back x3 reps - pt with significant difficulty stabilizing L stance phase without using R UE support on // bars so returned to B UE support - stepping balance recovery retraining via L LE lateral stepping over walking stick x10 reps focusing on increased speed of movement - added level 1 yellow theraband resistance x10 reps with continued focus on speed - B UE support on bar  and repeated cuing to maintain upright posture --- transitioned to R LE lateral stepping over walking stick focusing on L hip strengthening for stance using level 1 yellow theraband resistance x10reps Continued cuing throughout for increased L hip extension and abductor muscle activation.   Transported back to room and pt left seated in TIS w/c with needs in reach and seat belt alarm on.  Therapy Documentation Precautions:  Precautions Precautions: Fall, Other (comment) Precaution Comments: hx of CVA Lt hemi Required Braces or Orthoses: Other Brace Other Brace: has an AFO for Lt foot but has not been wearing much lately, not at hospital Restrictions Weight Bearing Restrictions Per Provider Order: No   Pain:  Session 1: No reports of pain throughout session.  Session 2: Reports minor R low back discomfort with side stepping exercises, but not impacting participation. Provided seated rest breaks as needed.    Therapy/Group: Individual Therapy  Ginny Forth , PT, DPT, NCS, CSRS 03/06/2023, 7:45 AM

## 2023-03-06 NOTE — Progress Notes (Signed)
Occupational Therapy Session Note  Patient Details  Name: Joseph Ortega MRN: [id number] Date of Birth: 04/12/1955  Today's Date: 03/06/2023 OT Individual Time: 4098-1191 OT Individual Time Calculation (min): 58 min    Short Term Goals: Week 2:  OT Short Term Goal 1 (Week 2): STG=LTG d/t ELOS  Skilled Therapeutic Interventions/Progress Updates:     Pt received using restroom upon OT arrival. Pt presenting to be in good spirits receptive to skilled OT session reporting 0/10 pain- OT offering intermittent rest breaks, repositioning, and therapeutic support to optimize participation in therapy session. Focus this session functional mobility training, BADL retraining, and HEP education.    Increased time provided on toilet d/t need for BM. Pt able to complete 3/3 toileting this session with close supervision +increased time with L UE supported on RW. Pt completed functional mobility toilet > wc ~106ft using RW with close supervision +increased time and min verbal cues for L foot placement- seated rest break provided following.   Engaged Pt in d/c planning discussion with focus on DME needs and follow-up therapy services. Pt reporting he would like to continue with OP OT/PT services upon d/c and that he can receive transportation to/from appointments from community transportation services. Pt also reporting he has no DME needs at this time as his RW and shower chair are both still in good condition.   Pt completed functional mobility <> therapy spaces this session ~172ft x2 trials using RW close supervision for endurance and functional mobility training. Pt heavily relying on B UEs supported on RW when stepping with L LE to accommodate for weakness, however able to complete task without LOB and rest breaks provided following.   Provided education on and guided Pt through completing gentle UE/trunk stretches to promote improved postural alignment, L UE ROM, and improve blood flow to L UE for edema  management. Pt completed the following self-assist  AAROM exercises/stretches with OT demonstrating each movement and providing verbal/tactile cues for technique, muscle engagement, postural alignment, and to incorporate deep breathing into each exercise.  -Chest openers with trunk rotation -Shoulder flexion -"Rock the baby" stretch -Shoulder stretch with gentle trunk rotation -Elbow flexion/extension -Forearm pronation/supination -Wrist flexion/extension -Wrist ulnar/radial deviation -Individual digit flexion/extension -Individual digit opposition   Pt was left resting in TISWC with call bell in reach, seatbelt alarm on, and all needs met.    Therapy Documentation Precautions:  Precautions Precautions: Fall, Other (comment) Precaution Comments: hx of CVA Lt hemi Required Braces or Orthoses: Other Brace Other Brace: has an AFO for Lt foot but has not been wearing much lately, not at hospital Restrictions Weight Bearing Restrictions Per Provider Order: No  Therapy/Group: Individual Therapy  Clide Deutscher 03/06/2023, 7:55 AM

## 2023-03-06 NOTE — Patient Care Conference (Cosign Needed Addendum)
Inpatient RehabilitationTeam Conference and Plan of Care Update Date: 03/06/2023   Time: 10:47 AM    Patient Name: Joseph Ortega      Medical Record Number: [id number]  Date of Birth: 1955/06/18 Sex: Male         Room/Bed: 4W14C/4W14C-01 Payor Info: Payor: [employer] MEDICARE / Plan: [employer] MEDICARE HMO / Product Type: *No Product type* /    Admit Date/Time:  02/25/2023  4:35 PM  Primary Diagnosis:  TBI (traumatic brain injury) [facility])  Hospital Problems: Principal Problem:   TBI (traumatic brain injury) (HCC) Active Problems:   Stroke (cerebrum) (HCC)   Current mild episode of major depressive disorder [facility])    Expected Discharge Date: Expected Discharge Date: 03/11/23  Team Members Present: Physician leading conference: Dr. Claudette Laws Social Worker Present: Lavera Guise, BSW Nurse Present: Chana Bode, RN PT Present: Casimiro Needle, PT OT Present: Mariann Barter, OT SLP Present: Everardo Pacific, SLP PPS Coordinator present : Fae Pippin, SLP     Current Status/Progress Goal Weekly Team Focus  Bowel/Bladder   Continent at times and incontinent at times. uses urinal            Swallow/Nutrition/ Hydration               ADL's   bathing min A, UB dressing superivsion, LB dressing CGA using reacher, toileting CGA using RW, grooming/hygine set-up, toilet and shower transfers CGA using RW   mod I overall; min A for bathing as his wife helps him with this   BADL retraining, activity tolerance, functional transfer training, L UE NMRE; Barriers- decreased support at home, activity tolerane, residual weakness from CVAs    Mobility   supine<>sit relying on bed features min A, sit<>stands and stand pivot transfers using RW with CGA/SBA, gait training up to ~130ft using RW with CGA, 3 stair navigation with unilateral UE support with CGA   mod-I based on pt's home support  transfer training, gait training, dynamic gait training, dynamic standing balance, L hemibody  NMR, stair navigation training, activity tolerance - barriers: pt has to reach mod-I level based on home support    Communication                Safety/Cognition/ Behavioral Observations  d/c from ST - at baseline            Pain       Pt will be free of pain or pain controlled below 3   asses pain q shift and prn, provide education and pain interventions as needed    Skin   LLE wound dressing changed   Pt will maintain god skin integrity and prevent further breakdown  Skin assesment, wound care and skin care, respositioning      Discharge Planning:  Discharging home with spouse able to provide supervision only on 12/23. 1 level home, ramp entrance. Patient has RW, rollator, BSC and shower seat.   Team Discussion: Patient with TBI and debility; hx lymphedema and hypokalemia. Patient is slow miving once up and has trouble with power up. Unable to use his AFO due to edema.  Patient on target to meet rehab goals: yes, currently needs min assist for bathing seated. Needs supervision for upper body dressing and CGA for lower body dressing using hemi techniques.  Needs CGA for transfers with a RW. Able to ambulate up to 150' and manage 2 steps with CGA. Goals for discharge set for mod I overall.  *See Care Plan and progress notes for long and  short-term goals.   Revisions to Treatment Plan:  Lac-hydrin cream to dermatitis on legs SLP service discontinued   Teaching Needs: Safety, medications, diet modification, skin care, wound care, transfers, toileting, etc  Current Barriers to Discharge: Decreased caregiver support and Home enviroment access/layout  Possible Resolutions to Barriers: Family education HH follow up services     Medical Summary Current Status: DM controlled , HTN fair control,Hypokalemia     Possible Resolutions to [employer] Focus: cont diabetic , BP management, monitor K+ , adjustment of KCL   Continued Need for Acute Rehabilitation Level of  Care: The patient requires daily medical management by a physician with specialized training in physical medicine and rehabilitation for the following reasons: Direction of a multidisciplinary physical rehabilitation program to maximize functional independence : Yes Medical management of patient stability for increased activity during participation in an intensive rehabilitation regime.: Yes Analysis of laboratory values and/or radiology reports with any subsequent need for medication adjustment and/or medical intervention. : Yes   I attest that I was present, lead the team conference, and concur with the assessment and plan of the team.   Chana Bode B 03/06/2023, 1:29 PM

## 2023-03-06 NOTE — Progress Notes (Signed)
Patient ID: Joseph Ortega, male   DOB: 1955/09/13, 67 y.o.   MRN: [id number]  Team Conference Report to Patient/Family  Team Conference discussion was reviewed with the patient and caregiver, including goals, any changes in plan of care and target discharge date.  Patient and caregiver express understanding and are in agreement.  The patient has a target discharge date of 03/11/23.   Sw met with patient in the room and provided team conference updates. Patient pleased with progress. No additional questions or concerns.  Andria Rhein 03/06/2023, 3:17 PM

## 2023-03-06 NOTE — Progress Notes (Signed)
PROGRESS NOTE   Subjective/Complaints: No issues , asked about low HR discussed effects of carvedilol Mild left thigh pain improved with stretching and  tylenol   ROS-   Pt denies SOB, abd pain, CP, N/V/C/D, and vision changes  Except for HPI is negative   No results found. Recent Labs    03/04/23 0530  WBC 4.9  HGB 13.4  HCT 38.5*  PLT 214    Recent Labs    03/04/23 0530  NA 136  K 3.0*  CL 104  CO2 27  GLUCOSE 131*  BUN 18  CREATININE 0.81  CALCIUM 8.6*     Intake/Output Summary (Last 24 hours) at 03/06/2023 0929 Last data filed at 03/06/2023 0700 Gross per 24 hour  Intake 476 ml  Output 2300 ml  Net -1824 ml     Pressure Injury 03/03/23 Buttocks Left;Medial Deep Tissue Pressure Injury - Purple or maroon localized area of discolored intact skin or blood-filled blister due to damage of underlying soft tissue from pressure and/or shear. non-blanchable purple area (Active)  03/03/23   Location: Buttocks  Location Orientation: Left;Medial  Staging: Deep Tissue Pressure Injury - Purple or maroon localized area of discolored intact skin or blood-filled blister due to damage of underlying soft tissue from pressure and/or shear. (as per Mauro Kaufmann RN)  Wound Description (Comments): non-blanchable purple area  Present on Admission: No    Physical Exam: Vital Signs Blood pressure (!) 148/56, pulse 63, temperature 98.4 F (36.9 C), temperature source Oral, resp. rate 16, height 6\' 1"  (1.854 m), weight 104.1 kg, SpO2 96%.   General: No acute distress Mood and affect are appropriate Heart: Regular rate and rhythm no rubs murmurs or extra sounds Lungs: Clear to auscultation, breathing unlabored, no rales or wheezes Abdomen: Positive bowel sounds, soft nontender to palpation, nondistended Extremities: No clubbing, cyanosis, or edema Skin: No evidence of breakdown, no evidence of rash, stasis dermatitis  improved ,    Neurologic:, motor strength is 5/5 in RIght and 3- left  deltoid, bicep, tricep, grip, hip flexor, knee extensors, ankle dorsiflexor and plantar flexor  Musculoskeletal:Mild pain with R knee ROM  Assessment/Plan: 1. Functional deficits which require 3+ hours per day of interdisciplinary therapy in a comprehensive inpatient rehab setting. Physiatrist is providing close team supervision and 24 hour management of active medical problems listed below. Physiatrist and rehab team continue to assess barriers to discharge/monitor patient progress toward functional and medical goals  Care Tool:  Bathing    Body parts bathed by patient: Left arm, Abdomen, Chest, Front perineal area, Right upper leg, Left upper leg, Face, Left lower leg, Right lower leg, Right arm   Body parts bathed by helper: Buttocks     Bathing assist Assist Level: Minimal Assistance - Patient > 75%     Upper Body Dressing/Undressing Upper body dressing   What is the patient wearing?: Pull over shirt    Upper body assist Assist Level: Supervision/Verbal cueing    Lower Body Dressing/Undressing Lower body dressing      What is the patient wearing?: Incontinence brief, Pants     Lower body assist Assist for lower body dressing: Contact Guard/Touching assist  Toileting Toileting Toileting Activity did not occur Press photographer and hygiene only): N/A (no void or bm)  Toileting assist Assist for toileting: Contact Guard/Touching assist     Transfers Chair/bed transfer  Transfers assist  Chair/bed transfer activity did not occur: Safety/medical concerns  Chair/bed transfer assist level: Contact Guard/Touching assist Chair/bed transfer assistive device: Armrests, Geologist, engineering   Ambulation assist   Ambulation activity did not occur: Safety/medical concerns (requires skilled assist and +2 w/c follow)  Assist level: Contact Guard/Touching assist Assistive device:  Walker-rolling Max distance: 120'   Walk 10 feet activity   Assist  Walk 10 feet activity did not occur: Safety/medical concerns  Assist level: Contact Guard/Touching assist Assistive device: Walker-rolling   Walk 50 feet activity   Assist Walk 50 feet with 2 turns activity did not occur: Safety/medical concerns  Assist level: Contact Guard/Touching assist Assistive device: Walker-rolling    Walk 150 feet activity   Assist Walk 150 feet activity did not occur: Safety/medical concerns         Walk 10 feet on uneven surface  activity   Assist Walk 10 feet on uneven surfaces activity did not occur: Safety/medical concerns         Wheelchair     Assist Is the patient using a wheelchair?: Yes Type of Wheelchair: Manual (TIS w/c)    Wheelchair assist level: Dependent - Patient 0%      Wheelchair 50 feet with 2 turns activity    Assist        Assist Level: Dependent - Patient 0%   Wheelchair 150 feet activity     Assist      Assist Level: Dependent - Patient 0%   Blood pressure (!) 148/56, pulse 63, temperature 98.4 F (36.9 C), temperature source Oral, resp. rate 16, height 6\' 1"  (1.854 m), weight 104.1 kg, SpO2 96%.  Medical Problem List and Plan: 1. Functional deficits secondary to TBI from fall with hemorrhagic contusion             -patient may  shower             -ELOS/Goals: 12/23 Mod I /sup, Team conference today please see physician documentation under team conference tab, met with team  to discuss problems,progress, and goals. Formulized individual treatment plan based on medical history, underlying problem and comorbidities.   Con't CIR PT, OT and SLP 2.  Antithrombotics: -DVT/anticoagulation:  Pharmaceutical: Lovenox added per discussion with NS.              -antiplatelet therapy:  Hold ASA 3. Pain Management: back and left knee pain since fall             --Headaches worse but managed with tramadol Left thigh pain after  therapy improved after stretching and tylenol  4. Mood/Behavior/Sleep: LCSW to follow for evaluation and support.              -antipsychotic agents: N/A 5. Neuropsych/cognition: This patient is capable of making decisions on his own behalf. 6. Skin/Wound Care: Routine pressure relief measures             --will add Vitamin C/Zincd no longer needs aquacel or foam dressing , has some flaking , will add lac hydrin 7. Fluids/Electrolytes/Nutrition: Monitor  I/O. Check BMET in am 12/19     Latest Ref Rng & Units 03/04/2023    5:30 AM 02/28/2023    6:23 AM 02/26/2023    6:08 AM  BMP  Glucose 70 -  99 mg/dL 604  540  981   BUN 8 - 23 mg/dL 18  22  24    Creatinine 0.61 - 1.24 mg/dL 1.91  4.78  2.95   Sodium 135 - 145 mmol/L 136  138  135   Potassium 3.5 - 5.1 mmol/L 3.0  3.5  3.9   Chloride 98 - 111 mmol/L 104  103  99   CO2 22 - 32 mmol/L 27  30  31    Calcium 8.9 - 10.3 mg/dL 8.6  8.6  8.6     8. T2DM poorly controlled: Hgb A1c-10.1--was 8.6 10/2022. Tends to miss insulin doses per reports             --was on Levemir 40 u pm, trulicity 3 mg/wk, Novolog TID, Farxiaga 10 mg             --monitor BS ac/hs and titrate Levemir to home dose.   CBG (last 3)  Recent Labs    03/05/23 1640 03/05/23 2223 03/06/23 0547  GLUCAP 113* 155* 90  Increase Semglee to 20U   Add novolog 3U TID- improved 12/18   9. HTN: Monitor BP TID. Continue Coreg bid and hydrochlorothiazide Vitals:   03/05/23 2034 03/06/23 0554  BP: 124/67 (!) 148/56  Pulse: (!) 57 63  Resp: 16 16  Temp: 97.8 F (36.6 C) 98.4 F (36.9 C)  SpO2: 97% 96%   BP control improving  10. Acute on chronic headaches:Improved on Topamax 11.  ABLA/Pelvic hematoma: Monitor H/H serially. Recheck CBC in am    Latest Ref Rng & Units 03/04/2023    5:30 AM 02/28/2023    6:23 AM 02/26/2023    6:08 AM  CBC  WBC 4.0 - 10.5 K/uL 4.9  5.9  7.5   Hemoglobin 13.0 - 17.0 g/dL 62.1  30.8  65.7   Hematocrit 39.0 - 52.0 % 38.5  36.0  37.7    Platelets 150 - 400 K/uL 214  173  171     12. Lymphedema w/stasis ulcer LLE: Daily wound care with silver Hydrofiber for moisture control and antimicrobial effect.  13. OSA: Continue CPAP.  14.  GB wall thickening: No signs of cholecystitis 15. Anxiety/Depression: controlled on buspar and Cymbalta  (intergrated psychology)  16. Tinea pedis left: Continue Clotrimazole for local measures.  17. Constipation: Will start Miralax 34 g today followed by 17 grams daily. Pt states this is not as good as pericolace which he takes at home will increase senna S    12/14- LBM yesterday  12/15- LBM this AM- incontinent which I hear from pt and staff is unusual for him 18. Stage II between buttocks- B/L inner buttocks  12/15- d/w charge nurse=- they will get picture and start barrier cream and monitor-       LOS: 9 days A FACE TO FACE EVALUATION WAS PERFORMED  Erick Colace 03/06/2023, 9:29 AM

## 2023-03-07 DIAGNOSIS — K5901 Slow transit constipation: Secondary | ICD-10-CM | POA: Diagnosis not present

## 2023-03-07 DIAGNOSIS — M961 Postlaminectomy syndrome, not elsewhere classified: Secondary | ICD-10-CM | POA: Diagnosis not present

## 2023-03-07 DIAGNOSIS — S069X1D Unspecified intracranial injury with loss of consciousness of 30 minutes or less, subsequent encounter: Secondary | ICD-10-CM | POA: Diagnosis not present

## 2023-03-07 DIAGNOSIS — F4323 Adjustment disorder with mixed anxiety and depressed mood: Secondary | ICD-10-CM | POA: Diagnosis not present

## 2023-03-07 LAB — BASIC METABOLIC PANEL
Anion gap: 5 (ref 5–15)
BUN: 23 mg/dL (ref 8–23)
CO2: 29 mmol/L (ref 22–32)
Calcium: 8.9 mg/dL (ref 8.9–10.3)
Chloride: 107 mmol/L (ref 98–111)
Creatinine, Ser: 1.19 mg/dL (ref 0.61–1.24)
GFR, Estimated: >60 mL/min (ref >60)
Glucose, Bld: 88 mg/dL (ref 70–99)
Potassium: 3.3 mmol/L — ABNORMAL LOW (ref 3.5–5.1)
Sodium: 141 mmol/L (ref 135–145)

## 2023-03-07 LAB — GLUCOSE, CAPILLARY
Glucose-Capillary: 81 mg/dL (ref 70–99)
Glucose-Capillary: 134 mg/dL — ABNORMAL HIGH (ref 70–99)
Glucose-Capillary: 174 mg/dL — ABNORMAL HIGH (ref 70–99)
Glucose-Capillary: 217 mg/dL — ABNORMAL HIGH (ref 70–99)

## 2023-03-07 LAB — CBC
HCT: 39.3 % (ref 39.0–52.0)
Hemoglobin: 13.4 g/dL (ref 13.0–17.0)
MCH: 32 pg (ref 26.0–34.0)
MCHC: 34.1 g/dL (ref 30.0–36.0)
MCV: 93.8 fL (ref 80.0–100.0)
Platelets: 182 10*3/uL (ref 150–400)
RBC: 4.19 MIL/uL — ABNORMAL LOW (ref 4.22–5.81)
RDW: 13.5 % (ref 11.5–15.5)
WBC: 4.3 10*3/uL (ref 4.0–10.5)
nRBC: 0 % (ref 0.0–0.2)

## 2023-03-07 LAB — MAGNESIUM: Magnesium: 1.9 mg/dL (ref 1.7–2.4)

## 2023-03-07 MED ORDER — DIPHENOXYLATE-ATROPINE 2.5-0.025 MG PO TABS
1.0000 | ORAL_TABLET | Freq: Four times a day (QID) | ORAL | Status: DC | PRN
  Administered 2023-03-07 – 2023-03-10 (×5): 1 via ORAL
  Filled 2023-03-07 (×5): qty 1

## 2023-03-07 NOTE — Progress Notes (Signed)
Occupational Therapy Session Note  Patient Details  Name: Gene Weatherby MRN: [id number] Date of Birth: Apr 14, 1955  Today's Date: 03/07/2023 OT Individual Time: 1350-1429 OT Individual Time Calculation (min): 39 min    Short Term Goals: Week 2:  OT Short Term Goal 1 (Week 2): STG=LTG d/t ELOS  Skilled Therapeutic Interventions/Progress Updates:     Pt received sitting up in TISWC presenting to be in good spirits receptive to skilled OT session reporting 0/10 pain- OT offering intermittent rest breaks, repositioning, and therapeutic support to optimize participation in therapy session. Focus this session functional mobility training, UB strengthening/stretching, and addressing personal self-care needs.   Pt requesting to use urinal during beginning of session. Pt completed sit>stand form wc to RW with supervision and maintained standing balance with L UE supported on RW while utilizing urinal close supervision provided for safety.   Transported Pt the therapy gym total A in wc for energy conservation and time management.    Engaged Pt in completing functional mobility training 163ft x2 trials using RW with focus on postural alignment and L foot height to increase safety and decrease risk of falls at d/c. Pt required close supervision for functional mobility with mod verbal cues provided for head positioning and min verbal cues for step length/height.  Sitting EOM Engaged Pt in completing the following exercises/stretches to improve Pt's postural alignment and L UE ROM/strength. Focused exercises on mind/muscle connection and deep breathing with each movement to promote improved ROM and muscle activation. Pt able to complete exercises with OT providing visual model and mod verbal/tactile cues for technique:  -Chest opener with trunk rotation -lateral leans onto L elbow -Shoulder protraction/retraction  -Cat/cow chest openers   Stand pivot EOM > wc using RW close supervision. Transported Pt  to therapy gym total A in [facility] for group Christmas dance session and Pt was left sitting in wc in therapy gym under supervision of therapy staff with all immediate needs met.    Therapy Documentation Precautions:  Precautions Precautions: Fall, Other (comment) Precaution Comments: hx of CVA Lt hemi Required Braces or Orthoses: Other Brace Other Brace: has an AFO for Lt foot but has not been wearing much lately, not at hospital Restrictions Weight Bearing Restrictions Per Provider Order: No   Therapy/Group: Individual Therapy  Clide Deutscher 03/07/2023, 2:25 PM

## 2023-03-07 NOTE — Progress Notes (Signed)
Occupational Therapy Session Note  Patient Details  Name: Joseph Ortega MRN: [id number] Date of Birth: 08/08/1955  Today's Date: 03/07/2023 OT Individual Time: 4132-4401 OT Individual Time Calculation (min): 70 min    Short Term Goals: Week 2:  OT Short Term Goal 1 (Week 2): STG=LTG d/t ELOS  Skilled Therapeutic Interventions/Progress Updates:     Pt received sitting EOB presenting to be in good spirits receptive to skilled OT session reporting 0/10 pain- OT offering intermittent rest breaks, repositioning, and therapeutic support to optimize participation in therapy session. Pt requesting to take shower this AM. Focus this session BADL retraining with emphasis on safety awareness, functional transfers, and increasing Pt's overall independence for upcoming d/c. Sit > stand from EOB to RW close supervision with decreased time required to power up this trial. Pt completed functional mobility to BR using RW and transferred to [facility] placed over toilet close supervision. Pt able to complete 3/3 toileting tasks with supervision +increased time following continent void and BM (loose)- documented in flowsheets. Pt completed ambulatory transfer into walk-in shower using RW and grab bars close supervision. While seated on TTB, Pt able to complete UB bathing with close supervision using long handled sponge to wash underarms and his back. Pt completed most LB bathing seated using long handled sponge to wash lower B LEs and feet standing only to wash buttocks with L UE supported on grab bar close supervision. Pt provided self increased time to complete bathing tasks for energy conservation and increased time for rest breaks with min verbal cues. Following shower, Pt able to dry self in seated position and complete functional mobility to EOB using RW close supervision +increased time. Pt completed LB dressing sitting EOB crossing B LEs into figure-four position to weave feet into pants with mod verbal cues required  for orientation and underwear and standing while using RW to bring pants to waist. Pt able to recall correct sequence for hemi technique with  min questioning cues, however requesting to donn shirt using alternate method with pt able to complete with supervision +increased time, however increased effort required. Pt donned socks and shoes sitting EOB by propping leg up on bed in modified figure-four position with supervision +increased time. Pt completed stand step EOB > TISWC using RW with CGA. MD in/out during session for morning rounding. Pt with improved balance and increased independence in completing sit<>stands this session. Pt was left resting in TISWC with call bell in reach, seatbelt alarm on, and all needs met.    Therapy Documentation Precautions:  Precautions Precautions: Fall, Other (comment) Precaution Comments: hx of CVA Lt hemi Required Braces or Orthoses: Other Brace Other Brace: has an AFO for Lt foot but has not been wearing much lately, not at hospital Restrictions Weight Bearing Restrictions Per Provider Order: No   Therapy/Group: Individual Therapy  Clide Deutscher 03/07/2023, 8:01 AM

## 2023-03-07 NOTE — Progress Notes (Signed)
PROGRESS NOTE   Subjective/Complaints: Has diarrhea but no laxatives for several days , pt refusing Senna S   ROS-   Pt denies SOB, abd pain, CP, N/V/C/D, and vision changes  Except for HPI is negative   No results found. Recent Labs    03/07/23 0546  WBC 4.3  HGB 13.4  HCT 39.3  PLT 182    Recent Labs    03/07/23 0546  NA 141  K 3.3*  CL 107  CO2 29  GLUCOSE 88  BUN 23  CREATININE 1.19  CALCIUM 8.9     Intake/Output Summary (Last 24 hours) at 03/07/2023 0831 Last data filed at 03/07/2023 1027 Gross per 24 hour  Intake 1200 ml  Output 1355 ml  Net -155 ml     Pressure Injury 03/03/23 Buttocks Left;Medial Deep Tissue Pressure Injury - Purple or maroon localized area of discolored intact skin or blood-filled blister due to damage of underlying soft tissue from pressure and/or shear. non-blanchable purple area (Active)  03/03/23   Location: Buttocks  Location Orientation: Left;Medial  Staging: Deep Tissue Pressure Injury - Purple or maroon localized area of discolored intact skin or blood-filled blister due to damage of underlying soft tissue from pressure and/or shear. (as per Mauro Kaufmann RN)  Wound Description (Comments): non-blanchable purple area  Present on Admission: No    Physical Exam: Vital Signs Blood pressure (!) 143/77, pulse (!) 49, temperature 98.1 F (36.7 C), resp. rate 18, height 6\' 1"  (1.854 m), weight 104.1 kg, SpO2 95%.   General: No acute distress Mood and affect are appropriate Heart: Regular rate and rhythm no rubs murmurs or extra sounds Lungs: Clear to auscultation, breathing unlabored, no rales or wheezes Abdomen: Positive bowel sounds, soft nontender to palpation, nondistended Extremities: No clubbing, cyanosis, or edema Skin: No evidence of breakdown, no evidence of rash, stasis dermatitis improved ,    Neurologic:, motor strength is 5/5 in RIght and 3- left  deltoid,  bicep, tricep, grip, hip flexor, knee extensors, ankle dorsiflexor and plantar flexor  Musculoskeletal:Mild pain with R knee ROM  Assessment/Plan: 1. Functional deficits which require 3+ hours per day of interdisciplinary therapy in a comprehensive inpatient rehab setting. Physiatrist is providing close team supervision and 24 hour management of active medical problems listed below. Physiatrist and rehab team continue to assess barriers to discharge/monitor patient progress toward functional and medical goals  Care Tool:  Bathing    Body parts bathed by patient: Left arm, Abdomen, Chest, Front perineal area, Right upper leg, Left upper leg, Face, Left lower leg, Right lower leg, Right arm   Body parts bathed by helper: Buttocks     Bathing assist Assist Level: Minimal Assistance - Patient > 75%     Upper Body Dressing/Undressing Upper body dressing   What is the patient wearing?: Pull over shirt    Upper body assist Assist Level: Supervision/Verbal cueing    Lower Body Dressing/Undressing Lower body dressing      What is the patient wearing?: Incontinence brief, Pants     Lower body assist Assist for lower body dressing: Contact Guard/Touching assist     Toileting Toileting Toileting Activity did not occur (Clothing  management and hygiene only): N/A (no void or bm)  Toileting assist Assist for toileting: Contact Guard/Touching assist     Transfers Chair/bed transfer  Transfers assist  Chair/bed transfer activity did not occur: Safety/medical concerns  Chair/bed transfer assist level: Contact Guard/Touching assist Chair/bed transfer assistive device: Armrests, Geologist, engineering   Ambulation assist   Ambulation activity did not occur: Safety/medical concerns (requires skilled assist and +2 w/c follow)  Assist level: Contact Guard/Touching assist Assistive device: Walker-rolling Max distance: 150   Walk 10 feet activity   Assist  Walk 10  feet activity did not occur: Safety/medical concerns  Assist level: Contact Guard/Touching assist Assistive device: Walker-rolling   Walk 50 feet activity   Assist Walk 50 feet with 2 turns activity did not occur: Safety/medical concerns  Assist level: Contact Guard/Touching assist Assistive device: Walker-rolling    Walk 150 feet activity   Assist Walk 150 feet activity did not occur: Safety/medical concerns  Assist level: Contact Guard/Touching assist Assistive device: Walker-rolling    Walk 10 feet on uneven surface  activity   Assist Walk 10 feet on uneven surfaces activity did not occur: Safety/medical concerns         Wheelchair     Assist Is the patient using a wheelchair?: Yes Type of Wheelchair: Manual (TIS w/c)    Wheelchair assist level: Dependent - Patient 0%      Wheelchair 50 feet with 2 turns activity    Assist        Assist Level: Dependent - Patient 0%   Wheelchair 150 feet activity     Assist      Assist Level: Dependent - Patient 0%   Blood pressure (!) 143/77, pulse (!) 49, temperature 98.1 F (36.7 C), resp. rate 18, height 6\' 1"  (1.854 m), weight 104.1 kg, SpO2 95%.  Medical Problem List and Plan: 1. 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  Antithrombotics: -DVT/anticoagulation:  Pharmaceutical: Lovenox added per discussion with NS.              -antiplatelet therapy:  Hold ASA 3. Pain Management: back and left knee pain since fall             --Headaches worse but managed with tramadol Left thigh pain after therapy improved after stretching and tylenol  4. Mood/Behavior/Sleep: LCSW to follow for evaluation and support.              -antipsychotic agents: N/A 5. Neuropsych/cognition: This patient is capable of making decisions on his own behalf. 6. Skin/Wound Care: Routine  pressure relief measures             --will add Vitamin C/Zincd no longer needs aquacel or foam dressing , has some flaking , will add lac hydrin 7. Fluids/Electrolytes/Nutrition: Monitor  I/O. Check BMET in am 12/19     Latest Ref Rng & Units 03/07/2023    5:46 AM 03/04/2023    5:30 AM 02/28/2023    6:23 AM  BMP  Glucose 70 - 99 mg/dL 88  161  096   BUN 8 - 23 mg/dL 23  18  22    Creatinine 0.61 - 1.24 mg/dL 0.45  4.09  8.11   Sodium 135 - 145  mmol/L 141  136  138   Potassium 3.5 - 5.1 mmol/L 3.3  3.0  3.5   Chloride 98 - 111 mmol/L 107  104  103   CO2 22 - 32 mmol/L 29  27  30    Calcium 8.9 - 10.3 mg/dL 8.9  8.6  8.6     8. T2DM poorly controlled: Hgb A1c-10.1--was 8.6 10/2022. Tends to miss insulin doses per reports             --was on Levemir 40 u pm, trulicity 3 mg/wk, Novolog TID, Farxiaga 10 mg             --monitor BS ac/hs and titrate Levemir to home dose.   CBG (last 3)  Recent Labs    03/06/23 1648 03/06/23 2058 03/07/23 0629  GLUCAP 140* 180* 81  Increase Semglee to 20U   Add novolog 3U TID- improved 12/18   9. HTN: Monitor BP TID. Continue Coreg bid and hydrochlorothiazide Vitals:   03/06/23 1929 03/07/23 0627  BP: 123/66 (!) 143/77  Pulse: (!) 49 (!) 49  Resp: 18 18  Temp: 97.6 F (36.4 C) 98.1 F (36.7 C)  SpO2: 94% 95%   BP control improving  10. Acute on chronic headaches:Improved on Topamax 11.  ABLA/Pelvic hematoma: Monitor H/H serially. Recheck CBC in am    Latest Ref Rng & Units 03/07/2023    5:46 AM 03/04/2023    5:30 AM 02/28/2023    6:23 AM  CBC  WBC 4.0 - 10.5 K/uL 4.3  4.9  5.9   Hemoglobin 13.0 - 17.0 g/dL 45.4  09.8  11.9   Hematocrit 39.0 - 52.0 % 39.3  38.5  36.0   Platelets 150 - 400 K/uL 182  214  173     12. Lymphedema w/stasis ulcer LLE: Daily wound care with silver Hydrofiber for moisture control and antimicrobial effect.  13. OSA: Continue CPAP.  14.  GB wall thickening: No signs of cholecystitis 15. Anxiety/Depression:  controlled on buspar and Cymbalta  (intergrated psychology)  16. Tinea pedis left: Continue Clotrimazole for local measures.  17. Constipation: now with multiple loose stools, not documented in chart but RN states there have been multiple loose stools, will give immodium prn 18. Stage II between buttocks- B/L inner buttocks  12/15- d/w charge nurse=- they will get picture and start barrier cream and monitor-       LOS: 10 days A FACE TO FACE EVALUATION WAS PERFORMED  Erick Colace 03/07/2023, 8:31 AM

## 2023-03-07 NOTE — Progress Notes (Signed)
Physical Therapy Session Note  Patient Details  Name: Joseph Ortega MRN: [id number] Date of Birth: [birth year]  Today's Date: 03/07/2023 PT Individual Time: 1324-4010 PT Individual Time Calculation (min): 76 min   Short Term Goals: Week 2:  PT Short Term Goal 1 (Week 2): = to LTGs based on ELOS  Skilled Therapeutic Interventions/Progress Updates:    Pt received sitting in TIS w/c and agreeable to therapy session. Sit<>stands using RW with supervision for safety progressing to mod-I. Gait training ~157ft to main therapy gym using RW with SBA for safety - continues to have slow gait speed, slight forward trunk flexed posture, L hip slightly posteriorly rotated compared to R, pauses during L stance phase.   Dynamic gait training including stepping over 2x 6" hurdles using RW with CGA for safety/steadying - leads with R LE, but able to bring L LE over behind.  Stair navigation training ascending/descending 3 steps (6" height) using R HR only (doesn't simulate home set-up but ensuring adequate B LE strength to be safe with stairs at home) with CGA for safety - step-to pattern leading with R LE on ascent and descent with good control - does rely heavily on R UE support.  Standing balance and L LE NMR focusing on improved stance control targeting L hip abductor and extensor activation while standing with R foot up on 4" step, R hand support on RW, and therapist providing facilitation for L hand to grasp and place horseshoes on basketball goal net up high to promote activation of hip muscles to maintain upright, erect posture - CGA for balance.  B LE strengthening exercise in // bars with BUE support via stepping up and then back down on/off 4" step leading with R and L LE x8 reps each with therapist providing cuing for increased trunk/hip extension for upright posture and not compensating with R UE on bar to pull himself up - when leading with L LE noticed L knee hyperextends, facilitation to decrease  and cuing to activate hip extensors.   Gait training ~164ft back to room using RW with supervision for safety and continued gait deviations as noted above with intermittent cuing throughout to allow pt time for self-correction.  Pt left seated in TIS w/c with needs in reach and chair alarm on.    Therapy Documentation Precautions:  Precautions Precautions: Fall, Other (comment) Precaution Comments: hx of CVA Lt hemi Required Braces or Orthoses: Other Brace Other Brace: has an AFO for Lt foot but has not been wearing much lately, not at hospital Restrictions Weight Bearing Restrictions Per Provider Order: No   Pain:  No reports of pain throughout session.    Therapy/Group: Individual Therapy  Ginny Forth , PT, DPT, NCS, CSRS 03/07/2023, 8:00 AM

## 2023-03-07 NOTE — Progress Notes (Signed)
Physical Therapy Session Note  Patient Details  Name: Joseph Ortega MRN: [id number] Date of Birth: 05-07-1955  Today's Date: 03/07/2023 PT Missed Time: 30 Minutes Missed Time Reason: Patient unwilling to participate   Pt participating in Dance Group - politely requesting to continue participating rather than having individual therapy time. Missed 30 minutes of PT treatment.    Brezlyn Manrique P Ellijah Leffel PT 03/07/2023, 7:48 AM

## 2023-03-08 DIAGNOSIS — K5901 Slow transit constipation: Secondary | ICD-10-CM | POA: Diagnosis not present

## 2023-03-08 DIAGNOSIS — S069X1D Unspecified intracranial injury with loss of consciousness of 30 minutes or less, subsequent encounter: Secondary | ICD-10-CM | POA: Diagnosis not present

## 2023-03-08 DIAGNOSIS — M961 Postlaminectomy syndrome, not elsewhere classified: Secondary | ICD-10-CM | POA: Diagnosis not present

## 2023-03-08 DIAGNOSIS — F4323 Adjustment disorder with mixed anxiety and depressed mood: Secondary | ICD-10-CM | POA: Diagnosis not present

## 2023-03-08 LAB — GLUCOSE, CAPILLARY
Glucose-Capillary: 83 mg/dL (ref 70–99)
Glucose-Capillary: 125 mg/dL — ABNORMAL HIGH (ref 70–99)
Glucose-Capillary: 114 mg/dL — ABNORMAL HIGH (ref 70–99)
Glucose-Capillary: 168 mg/dL — ABNORMAL HIGH (ref 70–99)

## 2023-03-08 MED ORDER — POTASSIUM CHLORIDE CRYS ER 20 MEQ PO TBCR
20.0000 meq | EXTENDED_RELEASE_TABLET | Freq: Two times a day (BID) | ORAL | Status: AC
  Administered 2023-03-08 – 2023-03-10 (×6): 20 meq via ORAL
  Filled 2023-03-08 (×6): qty 1

## 2023-03-08 NOTE — Progress Notes (Signed)
PROGRESS NOTE   Subjective/Complaints:  Still with loose stools only took one lomotil thus far  No abd pain   ROS-   Pt denies SOB, abd pain, CP, N/V/C/D, and vision changes  Except for HPI is negative   No results found. Recent Labs    03/07/23 0546  WBC 4.3  HGB 13.4  HCT 39.3  PLT 182    Recent Labs    03/07/23 0546  NA 141  K 3.3*  CL 107  CO2 29  GLUCOSE 88  BUN 23  CREATININE 1.19  CALCIUM 8.9     Intake/Output Summary (Last 24 hours) at 03/08/2023 0957 Last data filed at 03/08/2023 0809 Gross per 24 hour  Intake 1176 ml  Output 1250 ml  Net -74 ml         Physical Exam: Vital Signs Blood pressure 126/63, pulse (!) 49, temperature 97.7 F (36.5 C), resp. rate 17, height 6\' 1"  (1.854 m), weight 102.5 kg, SpO2 95%.   General: No acute distress Mood and affect are appropriate Heart: Regular rate and rhythm no rubs murmurs or extra sounds Lungs: Clear to auscultation, breathing unlabored, no rales or wheezes Abdomen: Positive bowel sounds, soft nontender to palpation, nondistended Extremities: No clubbing, cyanosis, or edema Skin: No evidence of breakdown, no evidence of rash, stasis dermatitis improved ,    Neurologic:, motor strength is 5/5 in RIght and 3- left  deltoid, bicep, tricep, grip, hip flexor, knee extensors, ankle dorsiflexor and plantar flexor  Musculoskeletal:Mild pain with R knee ROM  Assessment/Plan: 1. Functional deficits which require 3+ hours per day of interdisciplinary therapy in a comprehensive inpatient rehab setting. Physiatrist is providing close team supervision and 24 hour management of active medical problems listed below. Physiatrist and rehab team continue to assess barriers to discharge/monitor patient progress toward functional and medical goals  Care Tool:  Bathing    Body parts bathed by patient: Left arm, Abdomen, Chest, Front perineal area,  Right upper leg, Left upper leg, Face, Left lower leg, Right lower leg, Right arm   Body parts bathed by helper: Buttocks     Bathing assist Assist Level: Minimal Assistance - Patient > 75%     Upper Body Dressing/Undressing Upper body dressing   What is the patient wearing?: Pull over shirt    Upper body assist Assist Level: Supervision/Verbal cueing    Lower Body Dressing/Undressing Lower body dressing      What is the patient wearing?: Incontinence brief, Pants     Lower body assist Assist for lower body dressing: Contact Guard/Touching assist     Toileting Toileting Toileting Activity did not occur (Clothing management and hygiene only): N/A (no void or bm)  Toileting assist Assist for toileting: Contact Guard/Touching assist     Transfers Chair/bed transfer  Transfers assist  Chair/bed transfer activity did not occur: Safety/medical concerns  Chair/bed transfer assist level: Supervision/Verbal cueing Chair/bed transfer assistive device: Walker, Archivist   Ambulation assist   Ambulation activity did not occur: Safety/medical concerns (requires skilled assist and +2 w/c follow)  Assist level: Supervision/Verbal cueing Assistive device: Walker-rolling Max distance: 138ft   Walk 10 feet activity  Assist  Walk 10 feet activity did not occur: Safety/medical concerns  Assist level: Supervision/Verbal cueing Assistive device: Walker-rolling   Walk 50 feet activity   Assist Walk 50 feet with 2 turns activity did not occur: Safety/medical concerns  Assist level: Supervision/Verbal cueing Assistive device: Walker-rolling    Walk 150 feet activity   Assist Walk 150 feet activity did not occur: Safety/medical concerns  Assist level: Supervision/Verbal cueing Assistive device: Walker-rolling    Walk 10 feet on uneven surface  activity   Assist Walk 10 feet on uneven surfaces activity did not occur: Safety/medical  concerns         Wheelchair     Assist Is the patient using a wheelchair?: No Type of Wheelchair: Manual (TIS w/c)    Wheelchair assist level: Dependent - Patient 0%      Wheelchair 50 feet with 2 turns activity    Assist        Assist Level: Dependent - Patient 0%   Wheelchair 150 feet activity     Assist      Assist Level: Dependent - Patient 0%   Blood pressure 126/63, pulse (!) 49, temperature 97.7 F (36.5 C), resp. rate 17, height 6\' 1"  (1.854 m), weight 102.5 kg, SpO2 95%.  Medical Problem List and Plan: 1. 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  Antithrombotics: -DVT/anticoagulation:  Pharmaceutical: Lovenox added per discussion with NS.              -antiplatelet therapy:  Hold ASA 3. Pain Management: back and left knee pain since fall             --Headaches worse but managed with tramadol Left thigh pain after therapy improved after stretching and tylenol  4. Mood/Behavior/Sleep: LCSW to follow for evaluation and support.              -antipsychotic agents: N/A 5. Neuropsych/cognition: This patient is capable of making decisions on his own behalf. 6. Skin/Wound Care: Routine pressure relief measures             --will add Vitamin C/Zincd no longer needs aquacel or foam dressing , has some flaking , will add lac hydrin 7. Fluids/Electrolytes/Nutrition: Monitor  I/O. Check BMET in am 12/19     Latest Ref Rng & Units 03/07/2023    5:46 AM 03/04/2023    5:30 AM 02/28/2023    6:23 AM  BMP  Glucose 70 - 99 mg/dL 88  469  629   BUN 8 - 23 mg/dL 23  18  22    Creatinine 0.61 - 1.24 mg/dL 5.28  4.13  2.44   Sodium 135 - 145 mmol/L 141  136  138   Potassium 3.5 - 5.1 mmol/L 3.3  3.0  3.5   Chloride 98 - 111 mmol/L 107  104  103   CO2 22 - 32 mmol/L 29  27  30    Calcium 8.9 - 10.3 mg/dL 8.9  8.6  8.6     Cont KCL BID, recheck on 12/23 8. T2DM poorly controlled: Hgb A1c-10.1--was 8.6 10/2022. Tends to miss insulin doses per reports             --was on Levemir 40 u pm, trulicity 3  mg/wk, Novolog TID, Farxiaga 10 mg             --monitor BS ac/hs and titrate Levemir to home dose.   CBG (last 3)  Recent Labs    03/07/23 1629 03/07/23 2205 03/08/23 0551  GLUCAP 174* 217* 83  Increase Semglee to 20U   Add novolog 3U TID- improved 12/18  Well controlled in general , but elevated yesterday PM 9. HTN: Monitor BP TID. Continue Coreg bid and hydrochlorothiazide Vitals:   03/07/23 2004 03/08/23 0524  BP: 126/60 126/63  Pulse: (!) 58 (!) 49  Resp: 17 17  Temp: 97.7 F (36.5 C) 97.7 F (36.5 C)  SpO2: 95% 95%   BP controlled 10. Acute on chronic headaches:Improved on Topamax 11.  ABLA/Pelvic hematoma: Monitor H/H serially. Recheck CBC in am    Latest Ref Rng & Units 03/07/2023    5:46 AM 03/04/2023    5:30 AM 02/28/2023    6:23 AM  CBC  WBC 4.0 - 10.5 K/uL 4.3  4.9  5.9   Hemoglobin 13.0 - 17.0 g/dL 64.3  32.9  51.8   Hematocrit 39.0 - 52.0 % 39.3  38.5  36.0   Platelets 150 - 400 K/uL 182  214  173     12. Lymphedema w/stasis ulcer LLE: Daily wound care with silver Hydrofiber for moisture control and antimicrobial effect.  13. OSA: Continue CPAP.  14.  GB wall thickening: No signs of cholecystitis 15. Anxiety/Depression: controlled on buspar and Cymbalta  (intergrated psychology)  16. Tinea pedis left: Continue Clotrimazole for local measures.  17. Constipation: now with multiple loose stools, not documented in chart but RN states there have been multiple loose stools, will give immodium prn 18. Stage II between buttocks- B/L inner buttocks  12/15- d/w charge nurse=- they will get picture and start barrier cream and monitor-       LOS: 11 days A FACE TO FACE EVALUATION WAS PERFORMED  Erick Colace 03/08/2023, 9:57 AM

## 2023-03-08 NOTE — Progress Notes (Signed)
Physical Therapy Session Note  Patient Details  Name: Joseph Ortega MRN: [id number] Date of Birth: Jul 24, 1955  Today's Date: 03/08/2023 PT Individual Time: 0800-0900 PT Individual Time Calculation (min): 60 min   Short Term Goals: Week 2:  PT Short Term Goal 1 (Week 2): = to LTGs based on ELOS  Skilled Therapeutic Interventions/Progress Updates:    Pt recd in bathroom as hand off from NT. Supervision for hygiene and clothing management. ambulatory transfer to sink with CGA and RW. No complaint of pain.   Pt ambulated to therapy gym with RW and CGA. Pt able to recall cues from primary therapist, and when not distracted is able to largely maintain. Educated on reducing distractions and focusing on gait/safety. Session focused on stair navigation, with extensive discussion of pt's home set up. Navigated steps x 4 with R handrail with L lateral technique, pt reports this feels most similar to his home setup, though he has a grab bar at eye level on L side. Pt then performed 6 x 2 steps without rest break with BHR to build LE strength and endurance for stair navigation. Seated rest breaks in low chair in gym, required light min a to stand from low chair, CGA-supervision all other seats. Pt returned to room after session, remained in TIS, was left with all needs in reach and alarm active.   Therapy Documentation Precautions:  Precautions Precautions: Fall, Other (comment) Precaution Comments: hx of CVA Lt hemi Required Braces or Orthoses: Other Brace Other Brace: has an AFO for Lt foot but has not been wearing much lately, not at hospital Restrictions Weight Bearing Restrictions Per Provider Order: No General:       Therapy/Group: Individual Therapy  Juluis Rainier 03/08/2023, 1:48 PM

## 2023-03-08 NOTE — Plan of Care (Signed)
  Problem: Consults Goal: RH BRAIN INJURY PATIENT EDUCATION Description: Description: See Patient Education module for eduction specifics Outcome: Progressing   Problem: RH BOWEL ELIMINATION Goal: RH STG MANAGE BOWEL WITH ASSISTANCE Description: STG Manage Bowel with mod I Assistance. Outcome: Progressing   Problem: RH BLADDER ELIMINATION Goal: RH STG MANAGE BLADDER WITH ASSISTANCE Description: STG Manage Bladder With mod I Assistance Outcome: Progressing   Problem: RH SAFETY Goal: RH STG ADHERE TO SAFETY PRECAUTIONS W/ASSISTANCE/DEVICE Description: STG Adhere to Safety Precautions With cues Assistance/Device. Outcome: Progressing   Problem: RH PAIN MANAGEMENT Goal: RH STG PAIN MANAGED AT OR BELOW PT'S PAIN GOAL Description: < 4 with prns Outcome: Progressing

## 2023-03-08 NOTE — Plan of Care (Signed)
  Problem: Consults Goal: RH BRAIN INJURY PATIENT EDUCATION Description: Description: See Patient Education module for eduction specifics Outcome: Progressing   Problem: RH BOWEL ELIMINATION Goal: RH STG MANAGE BOWEL WITH ASSISTANCE Description: STG Manage Bowel with mod I Assistance. Outcome: Progressing Goal: RH STG MANAGE BOWEL W/MEDICATION W/ASSISTANCE Description: STG Manage Bowel with Medication with mod I  Assistance. Outcome: Progressing   Problem: RH BLADDER ELIMINATION Goal: RH STG MANAGE BLADDER WITH ASSISTANCE Description: STG Manage Bladder With mod I Assistance Outcome: Progressing   Problem: RH SAFETY Goal: RH STG ADHERE TO SAFETY PRECAUTIONS W/ASSISTANCE/DEVICE Description: STG Adhere to Safety Precautions With cues Assistance/Device. Outcome: Progressing   Problem: RH COGNITION-NURSING Goal: RH STG USES MEMORY AIDS/STRATEGIES W/ASSIST TO PROBLEM SOLVE Description: STG Uses Memory Aids/Strategies With cues Assistance to Problem Solve. Outcome: Progressing   Problem: RH PAIN MANAGEMENT Goal: RH STG PAIN MANAGED AT OR BELOW PT'S PAIN GOAL Description: < 4 with prns Outcome: Progressing   Problem: RH KNOWLEDGE DEFICIT BRAIN INJURY Goal: RH STG INCREASE KNOWLEDGE OF SELF CARE AFTER BRAIN INJURY Description: Patient and wife will be able to manage care at discharge using educational resources for medications and dietary modification independently Outcome: Progressing   Problem: RH KNOWLEDGE DEFICIT Goal: RH STG INCREASE KNOWLEDGE OF DIABETES Description: Patient and wife will be able to manage DM at discharge using educational resources for medications and dietary modification independently Outcome: Progressing Goal: RH STG INCREASE KNOWLEDGE OF HYPERTENSION Description: Patient and wife will be able to manage HTN at discharge using educational resources for medications and dietary modification independently Outcome: Progressing Goal: RH STG INCREASE KNOWLEGDE  OF HYPERLIPIDEMIA Description: Patient and wife will be able to manage HLD at discharge using educational resources for medications and dietary modification independently Outcome: Progressing   Problem: Education: Goal: Ability to describe self-care measures that may prevent or decrease complications (Diabetes Survival Skills Education) will improve Outcome: Progressing Goal: Individualized Educational Video(s) Outcome: Progressing   Problem: Coping: Goal: Ability to adjust to condition or change in health will improve Outcome: Progressing   Problem: Fluid Volume: Goal: Ability to maintain a balanced intake and output will improve Outcome: Progressing   Problem: Health Behavior/Discharge Planning: Goal: Ability to identify and utilize available resources and services will improve Outcome: Progressing Goal: Ability to manage health-related needs will improve Outcome: Progressing   Problem: Metabolic: Goal: Ability to maintain appropriate glucose levels will improve Outcome: Progressing   Problem: Nutritional: Goal: Maintenance of adequate nutrition will improve Outcome: Progressing Goal: Progress toward achieving an optimal weight will improve Outcome: Progressing   Problem: Skin Integrity: Goal: Risk for impaired skin integrity will decrease Outcome: Progressing   Problem: Tissue Perfusion: Goal: Adequacy of tissue perfusion will improve Outcome: Progressing

## 2023-03-08 NOTE — Progress Notes (Signed)
Physical Therapy Session Note  Patient Details  Name: Joseph Ortega MRN: [id number] Date of Birth: May 12, 1955  Today's Date: 03/08/2023 PT Individual Time: 5284-1324 PT Individual Time Calculation (min): 58 min   Short Term Goals: Week 2:  PT Short Term Goal 1 (Week 2): = to LTGs based on ELOS  Skilled Therapeutic Interventions/Progress Updates: Pt presents reclined back in TIS and agreeable to therapy.  Pt transfers sit to stand w/ supervision, no cues required throughout session for hand placement and safety during sit <> stand transfers.  Pt amb to main gym w/ RW and supervision.  Pt negotiated cone obstacle course w/ supervision.  Pt performed step-ups to 6" platform using alternating LES w/ use of RW.  Pt returned to room and transferred stand to sit on bed and performed sit <> supine transfers to flat bed w/o rails to simulate home situation.  Pt used UE pulling on R leg for supine to sit transfer.  Pt returned to bed and seat alarm on w/ all needs in reach.  Care handed off to NT.     Therapy Documentation Precautions:  Precautions Precautions: Fall, Other (comment) Precaution Comments: hx of CVA Lt hemi Required Braces or Orthoses: Other Brace Other Brace: has an AFO for Lt foot but has not been wearing much lately, not at hospital Restrictions Weight Bearing Restrictions Per Provider Order: No General:   Vital Signs:   Pain:0/10       Therapy/Group: Individual Therapy  [name] 03/08/2023, 12:15 PM

## 2023-03-08 NOTE — Progress Notes (Signed)
Occupational Therapy Session Note  Patient Details  Name: Joseph Ortega MRN: [id number] Date of Birth: 16-Jan-1956  Today's Date: 03/08/2023 OT Individual Time: 1300-1415 OT Individual Time Calculation (min): 75 min    Short Term Goals: Week 2:  OT Short Term Goal 1 (Week 2): STG=LTG d/t ELOS  Skilled Therapeutic Interventions/Progress Updates:     Pt received sitting up in wc dressed and ready for the day upon OT arrival politely declining need for BADLs this session. Pt presenting to be in good spirits receptive to skilled OT session reporting 0/10 pain- OT offering intermittent rest breaks, repositioning, and therapeutic support to optimize participation in therapy session. Spent time at beginning of session completing reassessment of Pt's vision, cognition, sensation, MMT, and ROM. Focus this session dynamic standing balance, activity tolerance, and L UE functional movements.   Transported Pt total A to therapy gym in wc for time management and energy conservation.   Engaged Pt in completing functional mobility using RW for endurance and functional mobility training to simulate home mobility. Focused task on postural alignment, L LE step length/height, and decreasing B UE reliance on RW. Pt able to ambulate >150 ft mod I with improved head position and step height this trial.   Engaged Pt in completing the following seated exercises and stretches to improve trunk/L UE ROM/strength for increased safety and independence in BADLs. OT provided verbal and tactile cues for muscle engagement, technique, and to incorporate deep breathing into each exercise.  -Chest openers with trunk rotation 2x10 reps -Modified sit-up EOM with 4# weighted ball 2x10 reps -[place] twists alternating R/L 2x10 reps -Lateral leans to L 2x10 reps -B UE Bicep curls with 3# weighted dowel 2x10 reps   Pt completed dynamic standing balance functional reaching activity to increase safety in standing position when  utilizing single UE in functional activities, standing tolerance, and improve L UE functional reaching patterns. Pt able to tolerate standing >5 minutes x2 trials during task while utilizing L UE to retrieve squigz from vertical mirror with OT facilitating increased supination and providing max verbal/tactile cues to decrease compensatory shoulder or elbow elevation. Pt able to maintain grasp on squigz without dropping and voluntarily release squigz with min A provided at elbow.   Pt completed stand pivot EOM>wc using RW with close supervision. Transported Pt back to room total A in wc for energy conservation. Pt was left resting in TISWC with call bell in reach, seatbelt alarm on, and all needs met.    Therapy Documentation Precautions:  Precautions Precautions: Fall, Other (comment) Precaution Comments: hx of CVA Lt hemi Required Braces or Orthoses: Other Brace Other Brace: has an AFO for Lt foot but has not been wearing much lately, not at hospital Restrictions Weight Bearing Restrictions Per Provider Order: No   Therapy/Group: Individual Therapy  Clide Deutscher 03/08/2023, 1:40 PM

## 2023-03-09 LAB — GLUCOSE, CAPILLARY
Glucose-Capillary: 143 mg/dL — ABNORMAL HIGH (ref 70–99)
Glucose-Capillary: 173 mg/dL — ABNORMAL HIGH (ref 70–99)
Glucose-Capillary: 118 mg/dL — ABNORMAL HIGH (ref 70–99)
Glucose-Capillary: 168 mg/dL — ABNORMAL HIGH (ref 70–99)

## 2023-03-10 LAB — GLUCOSE, CAPILLARY
Glucose-Capillary: 112 mg/dL — ABNORMAL HIGH (ref 70–99)
Glucose-Capillary: 71 mg/dL (ref 70–99)
Glucose-Capillary: 162 mg/dL — ABNORMAL HIGH (ref 70–99)
Glucose-Capillary: 190 mg/dL — ABNORMAL HIGH (ref 70–99)

## 2023-03-10 NOTE — Plan of Care (Signed)
  Problem: RH BLADDER ELIMINATION Goal: RH STG MANAGE BLADDER WITH ASSISTANCE Description: STG Manage Bladder With mod I Assistance Outcome: Progressing   Problem: RH SAFETY Goal: RH STG ADHERE TO SAFETY PRECAUTIONS W/ASSISTANCE/DEVICE Description: STG Adhere to Safety Precautions With cues Assistance/Device. Outcome: Progressing   Problem: RH PAIN MANAGEMENT Goal: RH STG PAIN MANAGED AT OR BELOW PT'S PAIN GOAL Description: < 4 with prns Outcome: Progressing   Problem: RH KNOWLEDGE DEFICIT Goal: RH STG INCREASE KNOWLEDGE OF DIABETES Description: Patient and wife will be able to manage DM at discharge using educational resources for medications and dietary modification independently Outcome: Progressing   Problem: Fluid Volume: Goal: Ability to maintain a balanced intake and output will improve Outcome: Progressing   Problem: Nutritional: Goal: Maintenance of adequate nutrition will improve Outcome: Progressing   Problem: Skin Integrity: Goal: Risk for impaired skin integrity will decrease Outcome: Progressing

## 2023-03-10 NOTE — Progress Notes (Signed)
   03/10/23 2043  BiPAP/CPAP/SIPAP  $ Non-Invasive Ventilator  Non-Invasive Vent Subsequent  BiPAP/CPAP/SIPAP Pt Type Adult  BiPAP/CPAP/SIPAP DREAMSTATIOND  Reason BIPAP/CPAP not in use Non-compliant

## 2023-03-10 NOTE — Progress Notes (Addendum)
Occupational Therapy Discharge Summary  Patient Details  Name: Joseph Ortega MRN: [id number] Date of Birth: Jun 24, 1955  Date of Discharge from OT service:March 10, 2023  Today's Date: 03/10/2023 OT Individual Time: 0109-3235 OT Individual Time Calculation (min): 42 min  Skilled Therapeutic Intervention:  Patient received tilted back in wheelchair in room.  Patient oriented and able to recall events leading up to his fall, and during the course of rehab.  Patient requesting to do a load of laundry.  Ambulated to laundry with modified independence using RW.  Patient does not have AFO in the hospital - he fell without it on, and reports wearing it intermittently due to LE edema.  Patient able to walk in room without physical assistance, however needed assistance to adjust position of tilt in space wheelchair.  Patient will not need this chair at home.  Would feel patient could walk more independently and safer with AFO - so held off making patient independent in room.  Patient in agreement and will continue to call for help as needed.  Left up in wheelchair slightly tilted with chair pad alarm engaged and call bell/ personal items in reach.     Patient has met 12 of 12 long term goals due to improved activity tolerance, improved balance, postural control, ability to compensate for deficits, improved attention, and improved awareness.  Patient to discharge at overall Modified Independent level.  Patient's care partner is independent to provide the necessary physical assistance at discharge.    Reasons goals not met: NA  Recommendation:  Patient will benefit from ongoing skilled OT services in outpatient setting to continue to advance functional skills in the area of BADL, iADL, and Reduce care partner burden.  Equipment: No equipment provided  Reasons for discharge: treatment goals met and discharge from hospital  Patient/family agrees with progress made and goals achieved: Yes  OT  Discharge Precautions/Restrictions  Precautions Precautions: Fall;Other (comment) Precaution Comments: hx of CVA Lt hemi Required Braces or Orthoses: Other Brace Other Brace: has an AFO for Lt foot but has not been wearing much lately, not at hospital Restrictions Weight Bearing Restrictions Per Provider Order: No Pain Pain Assessment Pain Score: 0-No pain ADL ADL Eating: Independent Where Assessed-Eating: Chair Grooming: Modified independent Where Assessed-Grooming: Sitting at sink Upper Body Bathing: Modified independent Where Assessed-Upper Body Bathing: Shower Lower Body Bathing: Modified independent Where Assessed-Lower Body Bathing: Edge of bed Upper Body Dressing: Modified independent (Device) Where Assessed-Upper Body Dressing: Edge of bed Lower Body Dressing: Modified independent Where Assessed-Lower Body Dressing: Edge of bed Toileting: Modified independent Toilet Transfer: Modified independent Statistician Method: Ambulating Tub/Shower Transfer: Modified independent Tub/Shower Transfer Method: Ship broker: Walk in shower, Shower seat with back Film/video editor: Modified independent Film/video editor Method: Designer, industrial/product: Grab bars, Shower seat with back Vision Baseline Vision/History: 1 Wears glasses Patient Visual Report: Peripheral vision impairment (has had surgery left eye - related to retina) Vision Assessment?: Wears glasses for reading Additional Comments: vision resolved - continues to have peripheral impairment- longstanding Perception  Perception: Impaired Praxis Praxis: Impaired Praxis Impairment Details: Motor planning Cognition Cognition Overall Cognitive Status: History of cognitive impairments - at baseline Arousal/Alertness: Awake/alert Orientation Level: Person;Place;Situation Person: Oriented Place: Oriented Situation: Oriented Memory: Appears intact Memory Impairment:  Retrieval deficit Decreased Short Term Memory: Verbal complex Attention: Selective Focused Attention: Appears intact Sustained Attention: Appears intact Selective Attention: Appears intact Awareness Impairment: Emergent impairment Problem Solving: Appears intact Reasoning: Appears intact Sequencing: Appears intact Decision Making:  Appears intact Initiating: Appears intact [employer] Scales of Cognitive Functioning: Purposeful, Appropriate: Modified Independent Brief Interview for Mental Status (BIMS) Repetition of Three Words (First Attempt): 3 Temporal Orientation: Year: Correct Temporal Orientation: Month: Accurate within 5 days Temporal Orientation: Day: Correct Recall: "Sock": Yes, no cue required Recall: "Blue": Yes, no cue required Recall: "Bed": Yes, no cue required BIMS Summary Score: 15 Sensation Sensation Light Touch: Appears Intact Hot/Cold: Appears Intact Stereognosis: Not tested Coordination Gross Motor Movements are Fluid and Coordinated: No Fine Motor Movements are Fluid and Coordinated: No Coordination and Movement Description: impaired due to hx of L hemiparesis (UE>LE) exacerbated new generalized weakness and deconditioning Motor  Motor Motor: Hemiplegia Motor - Discharge Observations: history of left hemiparesis Mobility  Bed Mobility Bed Mobility: Supine to Sit;Sit to Supine Supine to Sit: Independent with assistive device Sit to Supine: Independent with assistive device Transfers Sit to Stand: Independent with assistive device Stand to Sit: Independent with assistive device  Trunk/Postural Assessment  Cervical Assessment Cervical Assessment: Exceptions to [facility] Thoracic Assessment Thoracic Assessment: Exceptions to [facility] Lumbar Assessment Lumbar Assessment: Exceptions to [facility] Postural Control Postural Control: Within Functional Limits  Balance Balance Balance Assessed: Yes Static Sitting Balance Static Sitting - Balance Support: Feet  supported;Right upper extremity supported Static Sitting - Level of Assistance: 7: Independent Dynamic Sitting Balance Dynamic Sitting - Balance Support: Feet supported;During functional activity Dynamic Sitting - Level of Assistance: 6: Modified independent (Device/Increase time) Dynamic Sitting - Balance Activities: Reaching for objects Static Standing Balance Static Standing - Balance Support: During functional activity;Bilateral upper extremity supported Static Standing - Level of Assistance: 6: Modified independent (Device/Increase time) Dynamic Standing Balance Dynamic Standing - Balance Support: During functional activity;Bilateral upper extremity supported Dynamic Standing - Level of Assistance: 6: Modified independent (Device/Increase time) Dynamic Standing - Balance Activities: Reaching for objects Extremity/Trunk Assessment RUE Assessment RUE Assessment: Within Functional Limits General Strength Comments: 4+/5 throughout LUE Assessment LUE Assessment: Exceptions to [facility] Active Range of Motion (AROM) Comments: With effort and attempts to flex shoulder - has ~30* of abduction with lateral trunk flexion LUE Body System: Neuro Brunstrum levels for arm and hand: Arm;Hand Brunstrum level for arm: Stage III Synergy is performed voluntarily Brunstrum level for hand: Stage III Synergies performed voluntarily   Collier Salina 03/10/2023, 12:15 PM

## 2023-03-10 NOTE — Progress Notes (Signed)
PROGRESS NOTE   Subjective/Complaints: No c/o concerned about getting Outpt OT and PT referral neuro rehab [address] No abd pain   ROS-   Pt denies SOB, abd pain, CP, N/V/C/D, and vision changes  Except for HPI is negative   No results found. No results for input(s): "WBC", "HGB", "HCT", "PLT" in the last 72 hours.   No results for input(s): "NA", "K", "CL", "CO2", "GLUCOSE", "BUN", "CREATININE", "CALCIUM" in the last 72 hours.    Intake/Output Summary (Last 24 hours) at 03/10/2023 1000 Last data filed at 03/10/2023 0713 Gross per 24 hour  Intake 940 ml  Output 800 ml  Net 140 ml         Physical Exam: Vital Signs Blood pressure 128/64, pulse (!) 58, temperature 97.9 F (36.6 C), temperature source Oral, resp. rate 18, height 6\' 1"  (1.854 m), weight 102.4 kg, SpO2 100%.   General: No acute distress Mood and affect are appropriate Heart: Regular rate and rhythm no rubs murmurs or extra sounds Lungs: Clear to auscultation, breathing unlabored, no rales or wheezes Abdomen: Positive bowel sounds, soft nontender to palpation, nondistended Extremities: No clubbing, cyanosis, or edema Skin: No evidence of breakdown, no evidence of rash, stasis dermatitis improved ,    Neurologic:, motor strength is 5/5 in RIght and 3- left  deltoid, bicep, tricep, grip, hip flexor, knee extensors, ankle dorsiflexor and plantar flexor  Musculoskeletal:Mild pain with R knee ROM  Assessment/Plan: 1. Functional deficits which require 3+ hours per day of interdisciplinary therapy in a comprehensive inpatient rehab setting. Physiatrist is providing close team supervision and 24 hour management of active medical problems listed below. Physiatrist and rehab team continue to assess barriers to discharge/monitor patient progress toward functional and medical goals  Care Tool:  Bathing    Body parts bathed by patient: Left arm,  Abdomen, Chest, Front perineal area, Right upper leg, Left upper leg, Face, Left lower leg, Right lower leg, Right arm, Buttocks   Body parts bathed by helper: Buttocks     Bathing assist Assist Level: Supervision/Verbal cueing     Upper Body Dressing/Undressing Upper body dressing   What is the patient wearing?: Pull over shirt    Upper body assist Assist Level: Independent with assistive device    Lower Body Dressing/Undressing Lower body dressing      What is the patient wearing?: Incontinence brief, Pants     Lower body assist Assist for lower body dressing: Independent with assitive device     Toileting Toileting Toileting Activity did not occur (Clothing management and hygiene only): N/A (no void or bm)  Toileting assist Assist for toileting: Set up assist     Transfers Chair/bed transfer  Transfers assist  Chair/bed transfer activity did not occur: Safety/medical concerns  Chair/bed transfer assist level: Independent with assistive device Chair/bed transfer assistive device: Geologist, engineering   Ambulation assist   Ambulation activity did not occur: Safety/medical concerns (requires skilled assist and +2 w/c follow)  Assist level: Independent with assistive device Assistive device: Walker-rolling Max distance: 289ft   Walk 10 feet activity   Assist  Walk 10 feet activity did not occur: Safety/medical concerns  Assist level: Independent with assistive device Assistive device: Walker-rolling   Walk 50 feet activity   Assist Walk 50 feet with 2 turns activity did not occur: Safety/medical concerns  Assist level: Independent with assistive device Assistive device: Walker-rolling    Walk 150 feet activity   Assist Walk 150 feet activity did not occur: Safety/medical concerns  Assist level: Independent with assistive device Assistive device: Walker-rolling    Walk 10 feet on uneven surface  activity   Assist Walk 10 feet  on uneven surfaces activity did not occur: Safety/medical concerns   Assist level: Independent with assistive device Assistive device: Walker-rolling   Wheelchair     Assist Is the patient using a wheelchair?: No Type of Wheelchair: Manual (TIS w/c) Wheelchair activity did not occur: N/A  Wheelchair assist level: Dependent - Patient 0%      Wheelchair 50 feet with 2 turns activity    Assist    Wheelchair 50 feet with 2 turns activity did not occur: N/A   Assist Level: Dependent - Patient 0%   Wheelchair 150 feet activity     Assist  Wheelchair 150 feet activity did not occur: N/A   Assist Level: Dependent - Patient 0%   Blood pressure 128/64, pulse (!) 58, temperature 97.9 F (36.6 C), temperature source Oral, resp. rate 18, height 6\' 1"  (1.854 m), weight 102.4 kg, SpO2 100%.  Medical Problem List and Plan: 1. 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  Antithrombotics: -DVT/anticoagulation:  Pharmaceutical: Lovenox added per discussion with NS.              -antiplatelet therapy:  Hold ASA 3. Pain Management: back and left knee pain since fall             --Headaches worse but managed with tramadol Left thigh pain after therapy improved after stretching and tylenol  4. Mood/Behavior/Sleep: LCSW to follow for evaluation and support.              -antipsychotic agents: N/A 5. Neuropsych/cognition: This patient is capable of making decisions on his own behalf. 6. Skin/Wound Care: Routine pressure relief measures             --will add Vitamin C/Zincd no longer needs aquacel or foam dressing , has some flaking , will add lac hydrin 7. Fluids/Electrolytes/Nutrition: Monitor  I/O. Check BMET in am 12/19     Latest Ref Rng & Units 03/07/2023    5:46  AM 03/04/2023    5:30 AM 02/28/2023    6:23 AM  BMP  Glucose 70 - 99 mg/dL 88  829  562   BUN 8 - 23 mg/dL 23  18  22    Creatinine 0.61 - 1.24 mg/dL 1.30  8.65  7.84   Sodium 135 - 145 mmol/L 141  136  138   Potassium 3.5 - 5.1 mmol/L 3.3  3.0  3.5   Chloride 98 - 111 mmol/L 107  104  103   CO2 22 - 32 mmol/L 29  27  30    Calcium 8.9 - 10.3 mg/dL 8.9  8.6  8.6    Cont KCL BID, recheck on  12/23 8. T2DM poorly controlled: Hgb A1c-10.1--was 8.6 10/2022. Tends to miss insulin doses per reports             --was on Levemir 40 u pm, trulicity 3 mg/wk, Novolog TID, Farxiaga 10 mg             --monitor BS ac/hs and titrate Levemir to home dose.   CBG (last 3)  Recent Labs    03/09/23 1650 03/09/23 2035 03/10/23 0550  GLUCAP 118* 168* 112*  Increase Semglee to 20U   Add novolog 3U TID- controlled 12/22 9. HTN: Monitor BP TID. Continue Coreg bid and hydrochlorothiazide Vitals:   03/10/23 0615 03/10/23 0752  BP: 128/64   Pulse: (!) 57 (!) 58  Resp: 18   Temp:    SpO2: 100%    BP controlled 12/22 10. Acute on chronic headaches:Improved on Topamax 11.  ABLA/Pelvic hematoma: Monitor H/H serially. Recheck CBC in am    Latest Ref Rng & Units 03/07/2023    5:46 AM 03/04/2023    5:30 AM 02/28/2023    6:23 AM  CBC  WBC 4.0 - 10.5 K/uL 4.3  4.9  5.9   Hemoglobin 13.0 - 17.0 g/dL 40.9  81.1  91.4   Hematocrit 39.0 - 52.0 % 39.3  38.5  36.0   Platelets 150 - 400 K/uL 182  214  173     12. Lymphedema w/stasis ulcer LLE: Daily wound care with silver Hydrofiber for moisture control and antimicrobial effect.  13. OSA: Continue CPAP.  14.  GB wall thickening: No signs of cholecystitis 15. Anxiety/Depression: controlled on buspar and Cymbalta  (intergrated psychology)  16. Tinea pedis left: Continue Clotrimazole for local measures.  17. Constipation: now with multiple loose stools, not documented in chart but RN states there have been multiple loose stools, will give immodium prn 18.  Stage II between buttocks- B/L inner buttocks  12/15- d/w charge nurse=- they will get picture and start barrier cream and monitor-       LOS: 13 days A FACE TO FACE EVALUATION WAS PERFORMED  Erick Colace 03/10/2023, 10:00 AM

## 2023-03-10 NOTE — Progress Notes (Signed)
Physical Therapy Discharge Summary  Patient Details  Name: Joseph Ortega MRN: [id number] Date of Birth: Apr 18, 1955  Date of Discharge from PT service:March 10, 2023  Today's Date: 03/10/2023 PT Individual Time: 1914-7829 PT Individual Time Calculation (min): 56 min    Patient has met 9 of 9 long term goals due to improved activity tolerance, improved balance, improved postural control, increased strength, decreased pain, ability to compensate for deficits, improved awareness, and improved coordination.  Patient to discharge at an ambulatory level Modified Independent.   Patient's care partner unavailable to provide the necessary physical assistance at discharge.  Reasons goals not met: n/a  Recommendation:  Patient will benefit from ongoing skilled PT services in outpatient setting to continue to advance safe functional mobility, address ongoing impairments in gait velocity, gait mechanics, dynamic balance, and minimize fall risk.  Equipment: No equipment provided  Reasons for discharge: treatment goals met  Patient/family agrees with progress made and goals achieved: Yes  PT Discharge Precautions/Restrictions Precautions Precautions: Fall;Other (comment) Precaution Comments: hx of CVA Lt hemi Required Braces or Orthoses: Other Brace Other Brace: has an AFO for Lt foot but has not been wearing much lately, not at hospital Restrictions Weight Bearing Restrictions Per Provider Order: No Vital Signs Therapy Vitals Temp: 97.9 F (36.6 C) Temp Source: Oral Pulse Rate: (!) 58 Resp: 18 BP: 128/64 Patient Position (if appropriate): Standing Oxygen Therapy SpO2: 100 % O2 Device: Room Air Pain Pain Assessment Pain Scale: 0-10 Pain Score: 0-No pain Pain Interference Pain Interference Pain Effect on Sleep: 2. Occasionally Pain Interference with Therapy Activities: 1. Rarely or not at all Pain Interference with Day-to-Day Activities: 1. 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)  Mobility Bed Mobility Bed Mobility: Supine to Sit;Sit to Supine Supine to Sit: Independent with assistive device Sit to Supine: Independent with assistive device Transfers Transfers: Sit to Stand;Stand to Sit;Stand Pivot Transfers Sit to Stand: Independent with assistive device Stand to Sit: Independent with assistive device Stand Pivot Transfers: Independent with assistive device Transfer (Assistive device): Rolling walker Locomotion  Gait Ambulation: Yes Gait Assistance: Independent with assistive device Gait Distance (Feet): 200 Feet Assistive device: Rolling walker Gait Gait: Yes Gait Pattern: Impaired Gait Pattern: Poor foot clearance - left;Decreased step length - left;Step-through pattern;Decreased stance time - left;Decreased hip/knee flexion - left;Decreased dorsiflexion - left;Trunk rotated posteriorly on left;Decreased stride length Gait velocity: 0.74m/s Stairs / Additional Locomotion Stairs: Yes Stairs Assistance: Independent with assistive device Stair Management Technique: Two rails Number of Stairs: 4 Ramp: Independent with assistive device Curb: Independent with assistive device Pick up small object from the floor assist level: Independent with assistive device Wheelchair Mobility Wheelchair Mobility: No  Trunk/Postural Assessment  Cervical Assessment Cervical Assessment: Exceptions to [facility] (forward head) Thoracic Assessment Thoracic Assessment: Exceptions to [facility] (rounded shoulders) Lumbar Assessment Lumbar Assessment: Exceptions to [facility] (posterior pelvic tilt) Postural  Control Postural Control: Within Functional Limits  Balance Balance Balance Assessed: Yes Static Sitting Balance Static Sitting - Balance Support: Feet supported;Right upper extremity supported Static Sitting - Level of Assistance: 7: Independent Dynamic Sitting Balance Dynamic Sitting - Balance Support: Feet supported Dynamic Sitting - Level of Assistance: 6: Modified independent (Device/Increase time) Dynamic Sitting - Balance Activities:  Reaching for objects Static Standing Balance Static Standing - Balance Support: During functional activity;Bilateral upper extremity supported Static Standing - Level of Assistance: 6: Modified independent (Device/Increase time) Dynamic Standing Balance Dynamic Standing - Balance Support: During functional activity;Bilateral upper extremity supported Dynamic Standing - Level of Assistance: 6: Modified independent (Device/Increase time) Dynamic Standing - Balance Activities: Reaching for objects Extremity Assessment      RLE Assessment RLE Assessment: Within Functional Limits LLE Assessment LLE Assessment: Exceptions to [facility] General Strength Comments: gross 3/5, 0/5 DF  Session Note: Chart reviewed and pt agreeable to therapy. Pt received semi-reclined in bed with no c/o pain. Session focused on review of function in preparation for discharge. Pt initiated session with review of function as noted above. Pt also completed amb of 266ft using modI with RW. Pt gait speed noted to be 0.10m/s, indicating fall risk that is appropriate for intervention from continued outpatient therapy. Session education emphasized continuum of care. At end of session, pt was left seated in [facility] with alarm engaged, nurse call bell and all needs in reach.      Dionne Milo 03/10/2023, 8:55 AM

## 2023-03-11 ENCOUNTER — Other Ambulatory Visit (HOSPITAL_COMMUNITY): Payer: Self-pay

## 2023-03-11 DIAGNOSIS — S069X9S Unspecified intracranial injury with loss of consciousness of unspecified duration, sequela: Secondary | ICD-10-CM

## 2023-03-11 DIAGNOSIS — B353 Tinea pedis: Secondary | ICD-10-CM | POA: Insufficient documentation

## 2023-03-11 DIAGNOSIS — Z87898 Personal history of other specified conditions: Secondary | ICD-10-CM

## 2023-03-11 LAB — CBC
HCT: 39.7 % (ref 39.0–52.0)
Hemoglobin: 13.4 g/dL (ref 13.0–17.0)
MCH: 32.1 pg (ref 26.0–34.0)
MCHC: 33.8 g/dL (ref 30.0–36.0)
MCV: 95.2 fL (ref 80.0–100.0)
Platelets: 180 10*3/uL (ref 150–400)
RBC: 4.17 MIL/uL — ABNORMAL LOW (ref 4.22–5.81)
RDW: 14.1 % (ref 11.5–15.5)
WBC: 5.8 10*3/uL (ref 4.0–10.5)
nRBC: 0 % (ref 0.0–0.2)

## 2023-03-11 LAB — BASIC METABOLIC PANEL
Anion gap: 8 (ref 5–15)
BUN: 29 mg/dL — ABNORMAL HIGH (ref 8–23)
CO2: 28 mmol/L (ref 22–32)
Calcium: 8.9 mg/dL (ref 8.9–10.3)
Chloride: 103 mmol/L (ref 98–111)
Creatinine, Ser: 0.99 mg/dL (ref 0.61–1.24)
GFR, Estimated: >60 mL/min (ref >60)
Glucose, Bld: 138 mg/dL — ABNORMAL HIGH (ref 70–99)
Potassium: 4.1 mmol/L (ref 3.5–5.1)
Sodium: 139 mmol/L (ref 135–145)

## 2023-03-11 LAB — GLUCOSE, CAPILLARY
Glucose-Capillary: 138 mg/dL — ABNORMAL HIGH (ref 70–99)
Glucose-Capillary: 129 mg/dL — ABNORMAL HIGH (ref 70–99)

## 2023-03-11 MED ORDER — AMMONIUM LACTATE 12 % EX LOTN
TOPICAL_LOTION | CUTANEOUS | 0 refills | Status: DC | PRN
  Filled 2023-03-11: qty 400, fill #0

## 2023-03-11 MED ORDER — ASCORBIC ACID 500 MG PO TABS: 500.0000 mg | ORAL_TABLET | Freq: Every day | ORAL | Status: AC

## 2023-03-11 MED ORDER — ROSUVASTATIN CALCIUM 40 MG PO TABS
40.0000 mg | ORAL_TABLET | Freq: Every day | ORAL | 0 refills | Status: AC
  Filled 2023-03-11: qty 30, 30d supply, fill #0

## 2023-03-11 MED ORDER — HYDROCHLOROTHIAZIDE 12.5 MG PO TABS
12.5000 mg | ORAL_TABLET | Freq: Two times a day (BID) | ORAL | 0 refills | Status: DC
  Filled 2023-03-11: qty 60, 30d supply, fill #0

## 2023-03-11 MED ORDER — DOCUSATE SODIUM 100 MG PO CAPS
100.0000 mg | ORAL_CAPSULE | Freq: Two times a day (BID) | ORAL | 0 refills | Status: DC
  Filled 2023-03-11: qty 60, 30d supply, fill #0

## 2023-03-11 MED ORDER — DOCUSATE SODIUM 100 MG PO CAPS: 100.0000 mg | ORAL_CAPSULE | Freq: Two times a day (BID) | ORAL | 0 refills | Status: DC

## 2023-03-11 MED ORDER — CLOTRIMAZOLE 1 % EX CREA: TOPICAL_CREAM | Freq: Two times a day (BID) | CUTANEOUS | 0 refills | Status: DC

## 2023-03-11 MED ORDER — CARVEDILOL 12.5 MG PO TABS
12.5000 mg | ORAL_TABLET | Freq: Two times a day (BID) | ORAL | 0 refills | Status: DC
  Filled 2023-03-11: qty 60, 30d supply, fill #0

## 2023-03-11 MED ORDER — HYDROCHLOROTHIAZIDE 12.5 MG PO TABS: 12.5000 mg | ORAL_TABLET | Freq: Two times a day (BID) | ORAL | 0 refills | Status: DC

## 2023-03-11 MED ORDER — DICLOFENAC SODIUM 1 % EX GEL: 2.0000 g | Freq: Four times a day (QID) | CUTANEOUS | 0 refills | Status: DC

## 2023-03-11 MED ORDER — POTASSIUM CHLORIDE CRYS ER 20 MEQ PO TBCR: 20.0000 meq | EXTENDED_RELEASE_TABLET | Freq: Every day | ORAL | 0 refills | Status: DC

## 2023-03-11 MED ORDER — CARVEDILOL 12.5 MG PO TABS: 12.5000 mg | ORAL_TABLET | Freq: Two times a day (BID) | ORAL | 0 refills | Status: DC

## 2023-03-11 MED ORDER — ZINC SULFATE 220 (50 ZN) MG PO TABS: 220.0000 mg | ORAL_TABLET | Freq: Every day | ORAL | Status: DC

## 2023-03-11 MED ORDER — CLOTRIMAZOLE 1 % EX CREA
TOPICAL_CREAM | Freq: Two times a day (BID) | CUTANEOUS | 0 refills | Status: DC
  Filled 2023-03-11: qty 28, 7d supply, fill #0

## 2023-03-11 MED ORDER — POTASSIUM CHLORIDE CRYS ER 20 MEQ PO TBCR
20.0000 meq | EXTENDED_RELEASE_TABLET | Freq: Two times a day (BID) | ORAL | Status: DC
  Administered 2023-03-11: 20 meq via ORAL
  Filled 2023-03-11: qty 1

## 2023-03-11 MED ORDER — POTASSIUM CHLORIDE CRYS ER 20 MEQ PO TBCR
20.0000 meq | EXTENDED_RELEASE_TABLET | Freq: Every day | ORAL | 0 refills | Status: DC
  Filled 2023-03-11: qty 30, 30d supply, fill #0

## 2023-03-11 MED ORDER — ASCORBIC ACID 500 MG PO TABS
500.0000 mg | ORAL_TABLET | Freq: Every day | ORAL | 0 refills | Status: DC
  Filled 2023-03-11: qty 30, 30d supply, fill #0

## 2023-03-11 MED ORDER — AMMONIUM LACTATE 12 % EX LOTN: TOPICAL_LOTION | CUTANEOUS | Status: DC | PRN

## 2023-03-11 MED ORDER — DICLOFENAC SODIUM 1 % EX GEL
2.0000 g | Freq: Four times a day (QID) | CUTANEOUS | 0 refills | Status: DC
  Filled 2023-03-11: qty 50, 7d supply, fill #0

## 2023-03-11 MED ORDER — POTASSIUM CHLORIDE CRYS ER 20 MEQ PO TBCR
20.0000 meq | EXTENDED_RELEASE_TABLET | Freq: Two times a day (BID) | ORAL | 0 refills | Status: DC
  Filled 2023-03-11: qty 30, 15d supply, fill #0

## 2023-03-11 MED ORDER — INSULIN DETEMIR 100 UNIT/ML FLEXPEN: 23.0000 [IU] | PEN_INJECTOR | Freq: Every day | SUBCUTANEOUS | Status: DC

## 2023-03-11 MED ORDER — ZINC SULFATE 220 (50 ZN) MG PO TABS
220.0000 mg | ORAL_TABLET | Freq: Every day | ORAL | 0 refills | Status: DC
  Filled 2023-03-11: qty 30, 30d supply, fill #0

## 2023-03-11 MED ORDER — POTASSIUM CHLORIDE CRYS ER 20 MEQ PO TBCR
20.0000 meq | EXTENDED_RELEASE_TABLET | Freq: Two times a day (BID) | ORAL | 0 refills | Status: DC
  Filled 2023-03-11: qty 60, 30d supply, fill #0

## 2023-03-11 MED ORDER — ACETAMINOPHEN 325 MG PO TABS: 325.0000 mg | ORAL_TABLET | ORAL | Status: AC | PRN

## 2023-03-11 NOTE — Progress Notes (Signed)
Inpatient Rehabilitation Care Coordinator Discharge Note   Patient Details  Name: Joseph Ortega MRN: [id number] Date of Birth: [birth year]   Discharge location: Home  Length of Stay: 14 Days  Discharge activity level: Sup/Mod I  Home/community participation: spouse  Patient response OZ:HYQMVH Literacy - How often do you need to have someone help you when you read instructions, pamphlets, or other written material from your doctor or pharmacy?: Rarely  Patient response QI:ONGEXB Isolation - How often do you feel lonely or isolated from those around you?: Never  Services provided included: MD, RD, PT, OT, SLP, RN, CM, TR, Neuropsych, SW, Pharmacy  Financial Services:  Field seismologist Utilized: Private Insurance [employer]  Choices offered to/list presented to: patient  Follow-up services arranged:  Outpatient    Outpatient Servicies: Neuro OP PT OT      Patient response to transportation need: Is the patient able to respond to transportation needs?: Yes In the past 12 months, has lack of transportation kept you from medical appointments or from getting medications?: No In the past 12 months, has lack of transportation kept you from meetings, work, or from getting things needed for daily living?: No   Patient/Family verbalized understanding of follow-up arrangements:  Yes  Individual responsible for coordination of the follow-up plan: self  Confirmed correct DME delivered: Andria Rhein 03/11/2023    Comments (or additional information):  Summary of Stay    Date/Time Discharge Planning CSW  03/05/23 1440 Discharging home with spouse able to provide supervision only on 12/23. 1 level home, ramp entrance. Patient has RW, rollator, BSC and shower seat. CJB  02/26/23 1617 Discharging back home with spouse at [facility] level, supervision only). Ramped entrance. Spouse assists with meals and medication. Patient has: RW, rollator, BSc and shower seat CJB        Andria Rhein

## 2023-03-11 NOTE — Progress Notes (Signed)
Inpatient Rehabilitation Discharge Medication Review by a Pharmacist   A complete drug regimen review was completed for this patient to identify any potential clinically significant medication issues.   High Risk Drug Classes Is patient taking? Indication by Medication  Antipsychotic No    Anticoagulant No    Antibiotic No    Opioid Yes Tramadol - pain  Antiplatelet No    Hypoglycemics/insulin Yes Levemir and trulicity - DM  Vasoactive Medication Yes Carvedilol, guanfacine , hydrochlorthiazide- HTN  Chemotherapy No    Other Yes Acetaminophen, voltaren gel, gabapentin - pain Ascorbic acid, zinc, Kcl, MVI - supplementation Benedryl - itching Colace - constipation Cymbalta - MDD Synthroid - hypothyroid Rosuvastatin - HLD Tizanidine - spasms Topamax - headaches Trazadone - sleep          Type of Medication Issue Identified Description of Issue Recommendation(s)  Drug Interaction(s) (clinically significant)        Duplicate Therapy        Allergy        No Medication Administration End Date        Incorrect Dose        Additional Drug Therapy Needed        Significant med changes from prior encounter (inform family/care partners about these prior to discharge).      Other            Clinically significant medication issues were identified that warrant physician communication and completion of prescribed/recommended actions by midnight of the next day:  No   Name of provider notified for urgent issues identified:    Provider Method of Notification:        Pharmacist comments:    Time spent performing this drug regimen review (minutes):  20     Jeanella Cara, PharmD, [place] Clinical Pharmacist Please see AMION for all Pharmacists' Contact Phone Numbers 03/11/2023, 10:02 AM

## 2023-03-11 NOTE — Progress Notes (Signed)
Orthopedic Tech Progress Note Patient Details:  Joseph Ortega 1955/03/20 [id number] Called resting WHO into [facility] Patient ID: [name], male   DOB: 11-Sep-1955, 67 y.o.   MRN: [id number]  Lovett Calender 03/11/2023, 10:52 AM

## 2023-03-11 NOTE — Progress Notes (Signed)
INPATIENT REHABILITATION DISCHARGE NOTE   Discharge instructions by: Marissa Nestle, PA   Verbalized understanding: Yes  Skin care/Wound care healing? Yes  Pain: None  IV's: None  Tubes/Drains: None  O2: none   Safety instructions: reviewed with pt  Patient belongings: sent with pt  Discharged to: home  Discharged via: family transport   Notes: done   Marylu Lund, Charity fundraiser

## 2023-03-11 NOTE — Progress Notes (Signed)
PROGRESS NOTE   Subjective/Complaints: No acute complaints.  No events overnight.  Patient feeling good and prepared for discharge. BP elevated overnight, 150s Last BM 12-21 Labs this a.m. stable.   All questions regarding discharge answered.  ROS- + Bilateral lower extremity edema-stable Pt denies SOB, abd pain, CP, N/V/C/D, and vision changes    No results found. Recent Labs    03/11/23 0542  WBC 5.8  HGB 13.4  HCT 39.7  PLT 180     Recent Labs    03/11/23 0542  NA 139  K 4.1  CL 103  CO2 28  GLUCOSE 138*  BUN 29*  CREATININE 0.99  CALCIUM 8.9      Intake/Output Summary (Last 24 hours) at 03/11/2023 0830 Last data filed at 03/11/2023 0800 Gross per 24 hour  Intake 238 ml  Output 1325 ml  Net -1087 ml         Physical Exam: Vital Signs Blood pressure (!) 158/76, pulse 62, temperature (!) 97.4 F (36.3 C), temperature source Oral, resp. rate 15, height 6\' 1"  (1.854 m), weight 102.7 kg, SpO2 100%.   General: No acute distress.  Sitting up at bedside table.  Overweight. Psych: Mood and affect are appropriate Heart: Regular rate and rhythm no rubs murmurs or extra sounds.  2+ bilateral pitting edema. Lungs: Clear to auscultation, breathing unlabored, no rales or wheezes.  On room air. Abdomen: Positive bowel sounds, soft nontender to palpation, nondistended Skin: No evidence of breakdown, no evidence of rash, stasis dermatitis bilateral lower extremities-stable MSK: Moving all 4 extremities antigravity against resistance, active range of motion mildly delayed in right knee due to pain  Neurologic: Awake, alert, and oriented x 3.  Follows all simple commands.  No apparent deficits.  motor strength is 5/5 in RIght and 3- left  deltoid, bicep, tricep, grip, hip flexor, knee extensors, ankle dorsiflexor and plantar flexor Sensation intact bilateral lower extremities No apparent fine motor  deficits  MAS 2 left thumb flexor, otherwise MAS 1 in finger flexors but easily ranged.  No other notable spasticity.  Assessment/Plan: 1. Functional deficits which require 3+ hours per day of interdisciplinary therapy in a comprehensive inpatient rehab setting. Physiatrist is providing close team supervision and 24 hour management of active medical problems listed below. Physiatrist and rehab team continue to assess barriers to discharge/monitor patient progress toward functional and medical goals  Care Tool:  Bathing    Body parts bathed by patient: Left arm, Abdomen, Chest, Front perineal area, Right upper leg, Left upper leg, Face, Left lower leg, Right lower leg, Right arm, Buttocks   Body parts bathed by helper: Buttocks     Bathing assist Assist Level: Independent with assistive device     Upper Body Dressing/Undressing Upper body dressing   What is the patient wearing?: Pull over shirt    Upper body assist Assist Level: Independent with assistive device    Lower Body Dressing/Undressing Lower body dressing      What is the patient wearing?: Incontinence brief, Pants     Lower body assist Assist for lower body dressing: Independent with assitive device     Toileting Toileting Toileting Activity did not occur (  Clothing management and hygiene only): N/A (no void or bm)  Toileting assist Assist for toileting: Independent with assistive device     Transfers Chair/bed transfer  Transfers assist  Chair/bed transfer activity did not occur: Safety/medical concerns  Chair/bed transfer assist level: Independent with assistive device Chair/bed transfer assistive device: Geologist, engineering   Ambulation assist   Ambulation activity did not occur: Safety/medical concerns (requires skilled assist and +2 w/c follow)  Assist level: Independent with assistive device Assistive device: Walker-rolling Max distance: 26ft   Walk 10 feet  activity   Assist  Walk 10 feet activity did not occur: Safety/medical concerns  Assist level: Independent with assistive device Assistive device: Walker-rolling   Walk 50 feet activity   Assist Walk 50 feet with 2 turns activity did not occur: Safety/medical concerns  Assist level: Independent with assistive device Assistive device: Walker-rolling    Walk 150 feet activity   Assist Walk 150 feet activity did not occur: Safety/medical concerns  Assist level: Independent with assistive device Assistive device: Walker-rolling    Walk 10 feet on uneven surface  activity   Assist Walk 10 feet on uneven surfaces activity did not occur: Safety/medical concerns   Assist level: Independent with assistive device Assistive device: Walker-rolling   Wheelchair     Assist Is the patient using a wheelchair?: No Type of Wheelchair: Manual (TIS w/c) Wheelchair activity did not occur: N/A  Wheelchair assist level: Dependent - Patient 0%      Wheelchair 50 feet with 2 turns activity    Assist    Wheelchair 50 feet with 2 turns activity did not occur: N/A   Assist Level: Dependent - Patient 0%   Wheelchair 150 feet activity     Assist  Wheelchair 150 feet activity did not occur: N/A   Assist Level: Dependent - Patient 0%   Blood pressure (!) 158/76, pulse 62, temperature (!) 97.4 F (36.3 C), temperature source Oral, resp. rate 15, height 6\' 1"  (1.854 m), weight 102.7 kg, SpO2 100%.  Medical Problem List and Plan: 1. 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  The  patient is medically ready for discharge to home and will need follow-up with [facility] PM&R. In addition, they will need to follow up with their PCP.    2.  Antithrombotics: -DVT/anticoagulation:  Pharmaceutical: Lovenox added per discussion with NS.              -antiplatelet therapy:  Hold ASA 3. Pain Management: back and left knee pain since fall             --Headaches worse but managed with tramadol Left thigh pain after therapy improved after stretching and tylenol  4. Mood/Behavior/Sleep: LCSW to follow for evaluation and support.              -antipsychotic agents: N/A 5. Neuropsych/cognition: This patient is capable of making decisions on his own behalf. 6. Skin/Wound Care: Routine pressure relief measures             --  will add Vitamin C/Zincd            -- no longer needs aquacel or foam dressing , has some flaking , will add lac hydrin  7. Fluids/Electrolytes/Nutrition: Monitor  I/O. Check BMET in am 12/19     Latest Ref Rng & Units 03/11/2023    5:42 AM 03/07/2023    5:46 AM 03/04/2023    5:30 AM  BMP  Glucose 70 - 99 mg/dL 956  88  213   BUN 8 - 23 mg/dL 29  23  18    Creatinine 0.61 - 1.24 mg/dL 0.86  5.78  4.69   Sodium 135 - 145 mmol/L 139  141  136   Potassium 3.5 - 5.1 mmol/L 4.1  3.3  3.0   Chloride 98 - 111 mmol/L 103  107  104   CO2 22 - 32 mmol/L 28  29  27    Calcium 8.9 - 10.3 mg/dL 8.9  8.9  8.6    Cont KCL BID, recheck on 12/23 12/23: Reduce potassium to 20 mill equivalents daily on discharge  8. T2DM poorly controlled: Hgb A1c-10.1--was 8.6 10/2022. Tends to miss insulin doses per reports             --was on Levemir 40 u pm, trulicity 3 mg/wk, Novolog TID, Farxiaga 10 mg             --monitor BS ac/hs and titrate Levemir to home dose.   CBG (last 3)  Recent Labs    03/10/23 1655 03/10/23 2049 03/11/23 0532  GLUCAP 162* 190* 138*  Increase Semglee to 20U   Add novolog 3U TID- controlled 12/22 -stable for discharge  9. HTN: Monitor BP TID.  Continue Coreg bid and hydrochlorothiazide Vitals:   03/11/23 0524 03/11/23 0526  BP: (!) 150/69 (!) 158/76  Pulse: (!) 57 62  Resp: 15   Temp:    SpO2:  100%   BP controlled 12/22  -12-23: Overall elevated overnight 150s, given discharge today will defer to outpatient monitoring.  10. Acute on chronic headaches:Improved on Topamax-no complaints on discharge 11.  ABLA/Pelvic hematoma: Monitor H/H serially. Recheck CBC in am    Latest Ref Rng & Units 03/11/2023    5:42 AM 03/07/2023    5:46 AM 03/04/2023    5:30 AM  CBC  WBC 4.0 - 10.5 K/uL 5.8  4.3  4.9   Hemoglobin 13.0 - 17.0 g/dL 62.9  52.8  41.3   Hematocrit 39.0 - 52.0 % 39.7  39.3  38.5   Platelets 150 - 400 K/uL 180  182  214     12. Lymphedema w/stasis ulcer LLE: Daily wound care with silver Hydrofiber for moisture control and antimicrobial effect.  13. OSA: Continue CPAP.  14.  GB wall thickening: No signs of cholecystitis 15. Anxiety/Depression: controlled on buspar and Cymbalta  (intergrated psychology)  16. Tinea pedis left: Continue Clotrimazole for local measures.  17. Constipation: now with multiple loose stools, not documented in chart but RN states there have been multiple loose stools, will give immodium prn  -Denies constipation or loose stools 12/23  18. Stage II between buttocks- B/L inner buttocks  12/15- d/w charge nurse=- they will get picture and start barrier cream and monitor-       LOS: 14 days A FACE TO FACE EVALUATION WAS PERFORMED  Angelina Sheriff 03/11/2023, 8:30 AM

## 2023-03-11 NOTE — Discharge Summary (Signed)
Physician Discharge Summary  Patient ID: Joseph Ortega MRN: [id number] DOB/AGE: [birth year] 67 y.o.  Admit date: 02/25/2023 Discharge date: 03/11/2023  Discharge Diagnoses:  Principal Problem:   TBI (traumatic brain injury) [facility]) Active Problems:   Essential hypertension   Hypothyroidism   OSA (obstructive sleep apnea)   Type 2 diabetes mellitus (HCC)   Stroke (cerebrum) (HCC)   Current mild episode of major depressive disorder (HCC)   Tinea pedis   History of peripheral edema   Discharged Condition: stable  Significant Diagnostic Studies: DG Knee Complete 4 Views Left Result Date: 02/25/2023 CLINICAL DATA:  Pain EXAM: LEFT KNEE - COMPLETE 4+ VIEW COMPARISON:  None Available. FINDINGS: Mild degenerative changes with joint space narrowing and spurring. No acute bony abnormality. Specifically, no fracture, subluxation, or dislocation. No joint effusion. IMPRESSION: No acute bony abnormality. Electronically Signed   By: Charlett Nose M.D.   On: 02/25/2023 22:04   DG Lumbar Spine 2-3 Views Result Date: 02/25/2023 CLINICAL DATA:  Low back pain EXAM: LUMBAR SPINE - 2 VIEW COMPARISON:  02/21/2023 CT FINDINGS: Five lumbar type vertebral bodies are well visualized. Vertebral body height is well maintained. No anterolisthesis is noted. Mild osteophytic changes are seen. No soft tissue abnormality is noted. IMPRESSION: Mild degenerative change without acute abnormality. Electronically Signed   By: Alcide Clever M.D.   On: 02/25/2023 22:01     Labs:  Basic Metabolic Panel: Recent Labs  Lab 03/07/23 0546 03/07/23 1710 03/11/23 0542  NA 141  --  139  K 3.3*  --  4.1  CL 107  --  103  CO2 29  --  28  GLUCOSE 88  --  138*  BUN 23  --  29*  CREATININE 1.19  --  0.99  CALCIUM 8.9  --  8.9  MG  --  1.9  --     CBC:    Latest Ref Rng & Units 03/11/2023    5:42 AM 03/07/2023    5:46 AM 03/04/2023    5:30 AM  CBC  WBC 4.0 - 10.5 K/uL 5.8  4.3  4.9   Hemoglobin 13.0 - 17.0 g/dL 66.0   63.0  16.0   Hematocrit 39.0 - 52.0 % 39.7  39.3  38.5   Platelets 150 - 400 K/uL 180  182  214      CBG: Recent Labs  Lab 03/10/23 1155 03/10/23 1655 03/10/23 2049 03/11/23 0532 03/11/23 1201  GLUCAP 71 162* 190* 138* 129*    Brief HPI:   Joseph Ortega is a 67 y.o. 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  He was found to have 13 x 13 mm hemorrhagic parenchymal contusion right temporal lobe likely contrecoup with left posterior parietal hematoma, significance of tissue subcu edema and hematoma upper pelvic area.  He also noted to have massive gallbladder wall thickening with.  Cholecystic inflammatory changes question acute cholecystitis versus axillary GB injury.  Gallbladder ultrasound was negative for cholelithiasis or acute cholecystitis.  GB edema was question to be due to fluid overload and HIDA scan ordered showing filling of gallbladder after morphine challenge therefore surgery signed off.   Dr. Conchita Paris was consulted for input and recommended Keppra x 7 days for seizure prophylaxis, holding of aspirin as well as following with neurology due to question of small stroke with hemorrhagic transformation.  Repeat CT head showed stable bleed with no change in the edema.  Hospital course was significant for hypotension with lethargy felt to be related to polypharmacy.  He was treated with BiPAP with improvement in mental status.  Chronic lower extremity wounds were treated with silver Hydrofiber per input from wound care.  PT/OT was working with patient was noted to have deficits in mobility as well as ability to carry out ADLs.  CIR was recommended due to functional decline.   Hospital Course: Joseph Ortega was admitted to rehab 02/25/2023 for inpatient therapies to consist of PT, ST and OT at least three hours five days a  week. Past admission physiatrist, therapy team and rehab RN have worked together to provide customized collaborative inpatient rehab.  Vitamin C and zinc were added to help with healing of chronic stasis ulcers BLE.  His blood pressures were monitored on TID basis and have been stable on current regimen.  Edema BLE has greatly improved and the lesions on his shins are currently small scabbed areas.  Dry flaky skin bilateral lower extremities has resolved with use of Lac-Hydrin cream and is currently to be used on as needed basis. CPAP was ordered for use during his stay however he has declined some use.  He reports that he will be compliant with use of his home machine after discharge.    His diabetes has been monitored with ac/hs CBG checks and SSI was use prn for tighter BS control.  Blood sugars are currently controlled on low-dose insulin glargine with 3 units NovoLog TID. He was advised to resume his home sliding scale and slowly titrating his Levemir to home dose of blood sugars remain elevated.  He was also advised to resume his weekly Trulicity after discharge.  Chronic pain has been controlled with as needed use of Tylenol.  P.o. intake has been good no GI symptoms reported.  Serial check of CBC shows acute blood loss anemia to be improving.  Anxiety has been managed with home regimen of buspar.   Follow-up check of BMET showed hypokalemia and he was started on K dur bid with improvement. Labs prior to discharge show rise in K+ from 3.3 to 4.1 and Kdur was  decreased to once a day with recommendations of repeat check of BMET in 1-2 weeks to monitor for stability.  Clotrimazole added to tinea pedis on left and he was advised to continue using cream till it resolved.  He has shown improvement during his rehab stay and is currently modified independent in supervised setting.  He will continue to receive follow-up outpatient PT and OT at [facility] neuro rehab after discharge.    Rehab course: During patient's  stay in rehab weekly team conferences were held to monitor patient's progress, set goals and discuss barriers to discharge. At admission, patient required max assist with mobility and with ADL tasks. He exhibited mild to moderate cognitive linguistic deficits affecting impairments in memory, calculation and severe impairments initiating, sequencing and identifying errors.  He has had improvement in activity tolerance, balance, postural control as well as ability to compensate for deficits.  He is able to complete ADL tasks at modified independent level. He is independent for transfers and is able to ambulate 200 feet with use of rolling walker. He has had improvement in awareness, attention memory and problem-solving with supervisory assist and speech therapy signed off on 12/17 as patient at baseline level.  Family education not completed as his care partner is unable to provide  assistance needed.   Discharge disposition: 01-Home or Self Care  Diet: Heart healthy/carb modified.  Special Instructions: 1.  Monitor BS ac/hs with use of dexcom.  2.  Follow-up with neurology for input on resumption of aspirin.. 3.  Recommend repeat BMET in 7-10 days to monitor potassium level .  Discharge Instructions     Ambulatory referral to Neurology   Complete by: As directed    [facility].  An appointment is requested in approximately: 4 weeks   Ambulatory referral to Physical Medicine Rehab   Complete by: As directed       Allergies as of 03/11/2023   No Known Allergies      Medication List     STOP taking these medications    aspirin EC 81 MG tablet   Farxiga 10 MG Tabs tablet Generic drug: dapagliflozin propanediol   guanFACINE 2 MG Tb24 ER tablet Commonly known as: INTUNIV   levETIRAcetam 500 MG tablet Commonly known as: KEPPRA       TAKE these medications    acetaminophen 325 MG tablet Commonly known as: TYLENOL Take 1-2 tablets (325-650 mg total) by mouth every 4 (four) hours as  needed for mild pain (pain score 1-3).   Alive Mens Gummy Multivitamins Chew Chew 3 tablets by mouth daily.   ammonium lactate 12 % lotion Commonly known as: LAC-HYDRIN Apply topically as needed for dry skin.   ascorbic acid 500 MG tablet Commonly known as: VITAMIN C Take 1 tablet (500 mg total) by mouth daily with supper.   busPIRone 10 MG tablet Commonly known as: BUSPAR Take 10 mg by mouth 3 (three) times daily.   carvedilol 12.5 MG tablet Commonly known as: COREG Take 1 tablet (12.5 mg total) by mouth 2 (two) times daily with a meal.   clotrimazole 1 % cream Commonly known as: LOTRIMIN Apply topically 2 (two) times daily.   diclofenac Sodium 1 % Gel Commonly known as: VOLTAREN Apply 2 g topically 4 (four) times daily.   diphenhydrAMINE 25 mg capsule Commonly known as: BENADRYL Take 25 mg by mouth every 6 (six) hours as needed for allergies.   docusate sodium 100 MG capsule Commonly known as: COLACE Take 1 capsule (100 mg total) by mouth 2 (two) times daily.   DULoxetine 30 MG capsule Commonly known as: CYMBALTA Take 30 mg by mouth 2 (two) times daily.   gabapentin 300 MG capsule Commonly known as: Neurontin Take 1 capsule (300 mg total) by mouth 3 (three) times daily.   hydrochlorothiazide 12.5 MG tablet Commonly known as: HYDRODIURIL Take 1 tablet (12.5 mg total) by mouth 2 (two) times daily. What changed:  when to take this reasons to take this   insulin aspart 100 UNIT/ML injection Commonly known as: novoLOG Inject 0-15 Units into the skin 3 (three) times daily before meals. Maximum of 20 units Notes to patient: Can resume home sliding scale. We were using three units with meals as long as blood sugars were over 80 and you planned on eating at least 50% of the meal   insulin detemir 100 UNIT/ML FlexPen Commonly known as: LEVEMIR Inject 23 Units into the skin at bedtime. What changed: how much to take   levothyroxine 125 MCG tablet Commonly known  as: SYNTHROID Take 1 tablet (125 mcg total) by mouth daily at 6 (six) AM.   potassium chloride SA 20 MEQ tablet Commonly known as: Klor-Con M20 Take 1 tablet (20 mEq total) by mouth daily. What changed:  when to take this  reasons to take this   rosuvastatin 40 MG tablet Commonly known as: CRESTOR Take 1 tablet (40 mg total) by mouth daily.   tiZANidine 2 MG tablet Commonly known as: ZANAFLEX 2 mg every 8 (eight) hours as needed for muscle spasms.   topiramate 50 MG tablet Commonly known as: TOPAMAX Take 1 tablet (50 mg total) by mouth daily.   traMADol 50 MG tablet Commonly known as: ULTRAM Take 50 mg by mouth every 8 (eight) hours as needed. Notes to patient: You did not use any during your hospital stay--can use tylenol as needed   traZODone 100 MG tablet Commonly known as: DESYREL Take 100 mg by mouth at bedtime.   Trulicity 3 MG/0.5ML Soaj Generic drug: Dulaglutide Inject 3 mg into the skin once a week. What changed: additional instructions   Zinc Sulfate 220 (50 Zn) MG Tabs Take 1 tablet (220 mg total) by mouth daily with supper.        Follow-up Information     Center[facility]. Call.   Why: Thurs or Friday for post hospital follow up and check of BMET. Contact information: [address] [place] [address] [phone]         Erick Colace, MD Follow up.   Specialty: Physical Medicine and Rehabilitation Why: office will call you with follow up appointment Contact information: [address] [place] [address] [phone]         [facility] Follow up.   Specialty: Neurology Why: office will call you with follow up appointment Contact information: [address] [place] [address] [phone]                Signed: Jacquelynn Cree 03/11/2023, 4:51 PM

## 2023-03-22 LAB — CUP PACEART REMOTE DEVICE CHECK
Date Time Interrogation Session: 20250102151103
Implantable Pulse Generator Implant Date: 20240219
Pulse Gen Serial Number: 511022381

## 2023-03-25 ENCOUNTER — Ambulatory Visit (INDEPENDENT_AMBULATORY_CARE_PROVIDER_SITE_OTHER): Payer: Self-pay

## 2023-03-25 DIAGNOSIS — I639 Cerebral infarction, unspecified: Secondary | ICD-10-CM

## 2023-04-04 ENCOUNTER — Ambulatory Visit: Payer: Medicare HMO | Attending: Physical Medicine & Rehabilitation

## 2023-04-04 ENCOUNTER — Ambulatory Visit: Payer: Medicare HMO | Admitting: Occupational Therapy

## 2023-04-09 ENCOUNTER — Emergency Department (HOSPITAL_COMMUNITY): Payer: Medicare HMO

## 2023-04-09 ENCOUNTER — Encounter (HOSPITAL_COMMUNITY): Payer: Self-pay

## 2023-04-09 ENCOUNTER — Other Ambulatory Visit: Payer: Self-pay

## 2023-04-09 ENCOUNTER — Emergency Department (HOSPITAL_COMMUNITY)
Admission: EM | Admit: 2023-04-09 | Discharge: 2023-04-10 | Disposition: A | Discharge status: Home / Self Care | Payer: Medicare HMO | Attending: Emergency Medicine | Admitting: Emergency Medicine

## 2023-04-09 DIAGNOSIS — G9389 Other specified disorders of brain: Secondary | ICD-10-CM | POA: Insufficient documentation

## 2023-04-09 DIAGNOSIS — M62838 Other muscle spasm: Secondary | ICD-10-CM | POA: Insufficient documentation

## 2023-04-09 DIAGNOSIS — W01119A Fall on same level from slipping, tripping and stumbling with subsequent striking against unspecified sharp object, initial encounter: Secondary | ICD-10-CM | POA: Insufficient documentation

## 2023-04-09 DIAGNOSIS — W19XXXA Unspecified fall, initial encounter: Secondary | ICD-10-CM

## 2023-04-09 LAB — CBC WITH DIFFERENTIAL/PLATELET
Abs Immature Granulocytes: 0.04 10*3/uL (ref 0.00–0.07)
Basophils Absolute: 0 10*3/uL (ref 0.0–0.1)
Basophils Relative: 0 %
Eosinophils Absolute: 0.1 10*3/uL (ref 0.0–0.5)
Eosinophils Relative: 1 %
HCT: 42.2 % (ref 39.0–52.0)
Hemoglobin: 14.6 g/dL (ref 13.0–17.0)
Immature Granulocytes: 1 %
Lymphocytes Relative: 12 %
Lymphs Abs: 1 10*3/uL (ref 0.7–4.0)
MCH: 32.3 pg (ref 26.0–34.0)
MCHC: 34.6 g/dL (ref 30.0–36.0)
MCV: 93.4 fL (ref 80.0–100.0)
Monocytes Absolute: 0.5 10*3/uL (ref 0.1–1.0)
Monocytes Relative: 6 %
Neutro Abs: 6.7 10*3/uL (ref 1.7–7.7)
Neutrophils Relative %: 80 %
Platelets: 189 10*3/uL (ref 150–400)
RBC: 4.52 MIL/uL (ref 4.22–5.81)
RDW: 14.4 % (ref 11.5–15.5)
WBC: 8.4 10*3/uL (ref 4.0–10.5)
nRBC: 0 % (ref 0.0–0.2)

## 2023-04-09 LAB — BASIC METABOLIC PANEL
Anion gap: 11 (ref 5–15)
BUN: 15 mg/dL (ref 8–23)
CO2: 28 mmol/L (ref 22–32)
Calcium: 9.4 mg/dL (ref 8.9–10.3)
Chloride: 98 mmol/L (ref 98–111)
Creatinine, Ser: 0.73 mg/dL (ref 0.61–1.24)
GFR, Estimated: >60 mL/min (ref >60)
Glucose, Bld: 338 mg/dL — ABNORMAL HIGH (ref 70–99)
Potassium: 3.9 mmol/L (ref 3.5–5.1)
Sodium: 137 mmol/L (ref 135–145)

## 2023-04-09 NOTE — ED Triage Notes (Signed)
Patient BIB [facility] from home for back spasms causing him to fall. Has hx of same since previous stroke, did hit head, no thinners or LOC.Pt c/o neck, head and hip pain. Patient able to bear weight and ambulate after fall. BP 168/90, HR 60, RR 16, 96% RA, CBG 353

## 2023-04-09 NOTE — ED Provider Triage Note (Signed)
Emergency Medicine Provider Triage Evaluation Note  Joseph Ortega , a 68 y.o. male  was evaluated in triage.  Pt complains of back pain.  Had a back spasm earlier tonight which caused him to fall backwards and struck the back of his head..  Another similar episode of spasm with subsequent fall.  History of stroke with residual left upper extremity left lower extremity weakness.  Review of Systems  Positive: As above Negative: As above  Physical Exam  BP (!) 148/82 (BP Location: Left Arm)   Pulse 62   Temp 99.1 F (37.3 C) (Oral)   Resp 16   Ht 6\' 1"  (1.854 m)   Wt 101.2 kg   SpO2 98%   BMI 29.42 kg/m  Gen:   Awake, no distress   Resp:  Normal effort  MSK:   Moves extremities without difficulty.  Left upper extremity left lower extremity weakness (baseline) midline lumbar tenderness Other:    Medical Decision Making  Medically screening exam initiated at 10:12 PM.  Appropriate orders placed.  [name] was informed that the remainder of the evaluation will be completed by another provider, this initial triage assessment does not replace that evaluation, and the importance of remaining in the ED until their evaluation is complete.  2 falls at home today with positive head strike.  Complaint of lower back pain.  No anticoagulation.  Will order CT head C-spine lumbar spine chest x-ray to evaluate for traumatic findings along with basic labs and EKG.  Remainder of care per ED team   Royanne Foots, DO 04/09/23 2229

## 2023-04-10 MED ORDER — CYCLOBENZAPRINE HCL 10 MG PO TABS: 10.0000 mg | ORAL_TABLET | Freq: Two times a day (BID) | ORAL | 0 refills | Status: DC | PRN

## 2023-04-10 NOTE — ED Provider Notes (Signed)
[facility]EMER[facility]ENCY DEPT [facility] Emergency Department Provider Note MRN:  [id number]  Arrival date & time: 04/10/23     Chief Complaint   Fall   History of Present Illness   Joseph Ortega is a 68 y.o. year-old male presents to the ED with chief complaint of fall.  Patient states that he occasionally gets severe back spasms.  States that when he gets the spasms, the pain is usually such that he collapses.  He normally uses a walker.  He states that tonight he fell twice, once while using the bathroom and once while opening a door.  He states that he hit his head.  He complains of neck pain and back pain.  History provided by patient.   Review of Systems  Pertinent positive and negative review of systems noted in HPI.    Physical Exam   Vitals:   04/10/23 0156 04/10/23 0403  BP: (!) 141/80 (!) 163/69  Pulse: 67 60  Resp: 16 15  Temp: 98.7 F (37.1 C) 98 F (36.7 C)  SpO2: 99% 96%    CONSTITUTIONAL:  well-appearing, NAD NEURO:  Alert and oriented x 3, CN 3-12 grossly intact EYES:  eyes equal and reactive ENT/NECK:  Supple, no stridor  CARDIO:  normal rate, regular rhythm, appears well-perfused  PULM:  No respiratory distress, CTAB GI/GU:  non-distended,  MSK/SPINE:  No gross deformities, no edema, moves all extremities, no midline cervical spine tenderness  SKIN:  no rash, atraumatic   *Additional and/or pertinent findings included in MDM below  Diagnostic and Interventional Summary    EKG Interpretation Date/Time:    Ventricular Rate:    PR Interval:    QRS Duration:    QT Interval:    QTC Calculation:   R Axis:      Text Interpretation:         Labs Reviewed  BASIC METABOLIC PANEL - Abnormal; Notable for the following components:      Result Value   Glucose, Bld 338 (*)    All other components within normal limits  CBC WITH DIFFERENTIAL/PLATELET    CT Head Wo Contrast  Final Result    CT Cervical Spine Wo Contrast  Final Result    CT  Lumbar Spine Wo Contrast  Final Result    DG Chest 2 View  Final Result      Medications - No data to display   Procedures  /  Critical Care Procedures  ED Course and Medical Decision Making  I have reviewed the triage vital signs, the nursing notes, and pertinent available records from the EMR.  Social Determinants Affecting Complexity of Care: Patient has no clinically significant social determinants affecting this chief complaint..   ED Course:    Medical Decision Making Patient here after 2 falls today.  He states that falls are common for him when his back spasms.  He is in PT.  He uses a walker.    Imaging and labs are reassuring.  Patient is able to weight bear in the ED.  I've encouraged him to use his walker.  Will give RX for muscle relaxer.  Recommend close outpatient follow-up.         Consultants: No consultations were needed in caring for this patient.   Treatment and Plan: Emergency department workup does not suggest an emergent condition requiring admission or immediate intervention beyond  what has been performed at this time. The patient is safe for discharge and has  been instructed to return immediately  for worsening symptoms, change in  symptoms or any other concerns    Final Clinical Impressions(s) / ED Diagnoses     ICD-10-CM   1. Fall, initial encounter  W19.XXXA     2. Muscle spasm  I34.742       ED Discharge Orders          Ordered    cyclobenzaprine (FLEXERIL) 10 MG tablet  2 times daily PRN        04/10/23 0404              Discharge Instructions Discussed with and Provided to Patient:   Discharge Instructions   None      Roxy Horseman, PA-C 04/10/23 0404    Gilda Crease, MD 04/10/23 [phone]

## 2023-04-24 ENCOUNTER — Other Ambulatory Visit (HOSPITAL_COMMUNITY): Payer: Self-pay | Admitting: Neurosurgery

## 2023-04-24 ENCOUNTER — Encounter: Payer: Self-pay | Admitting: Student

## 2023-04-24 ENCOUNTER — Ambulatory Visit: Payer: Medicare HMO | Admitting: Student

## 2023-04-24 VITALS — BP 120/60 | HR 72 | Ht 73.0 in | Wt 223.0 lb

## 2023-04-24 DIAGNOSIS — I10 Essential (primary) hypertension: Secondary | ICD-10-CM

## 2023-04-24 DIAGNOSIS — Z1159 Encounter for screening for other viral diseases: Secondary | ICD-10-CM | POA: Diagnosis not present

## 2023-04-24 DIAGNOSIS — E039 Hypothyroidism, unspecified: Secondary | ICD-10-CM | POA: Diagnosis not present

## 2023-04-24 DIAGNOSIS — M47816 Spondylosis without myelopathy or radiculopathy, lumbar region: Secondary | ICD-10-CM

## 2023-04-24 DIAGNOSIS — Z794 Long term (current) use of insulin: Secondary | ICD-10-CM | POA: Diagnosis not present

## 2023-04-24 DIAGNOSIS — E119 Type 2 diabetes mellitus without complications: Secondary | ICD-10-CM

## 2023-04-24 DIAGNOSIS — Z23 Encounter for immunization: Secondary | ICD-10-CM | POA: Diagnosis not present

## 2023-04-24 NOTE — Assessment & Plan Note (Signed)
Last A1C of 10.1. Currently taking Trulicity 3 mg weekly, Levemir 23 U at night, and Novolog sliding scale with meals.  -Repeat A1C next week - UACR today - Continue home meds above - Instructed to obtain eye exam

## 2023-04-24 NOTE — Patient Instructions (Signed)
 It was great to see you! Thank you for allowing me to participate in your care!   I recommend that you always bring your medications to each appointment as this makes it easy to ensure we are on the correct medications and helps us  not miss when refills are needed.  Our plans for today:  - Please continue taking your current medications.  - Follow-up with Neurology, you can ask about stopping Topiramate  - Please obtain records of colonoscopy, so we can determine when you need to repeat it - My team will reach out to schedule a medicare annual wellness visit, this is usually done over the phone with a nurse - Please go to your eye doctor for dilated eye exam - You received Prevnar-20 vaccine today to prevent certain types of pneumonia  - I will see you next week to discuss your low back pain and continue working on your chronic medical conditions  We are checking some labs today, I will call you if they are abnormal will send you a MyChart message or a letter if they are normal.  If you do not hear about your labs in the next 2 weeks please let us  know.  Take care and seek immediate care sooner if you develop any concerns. Please remember to show up 15 minutes before your scheduled appointment time!  Gladis Church, DO [facility]

## 2023-04-24 NOTE — Assessment & Plan Note (Signed)
 Check TSH today. -Continue home synthroid 

## 2023-04-24 NOTE — Progress Notes (Addendum)
    SUBJECTIVE:   CHIEF COMPLAINT / HPI: New patient visit  Acute concerns:  -Recurrent falls (from low back pain, his legs go numb and he falls) (imaging has been negative) (right leg radiculopathy). -Diarrhea for 2 months, not every day, with certain foods. Taking tums and alka seltzer -Leg sore (hx of venous stasis ulcers)  Discussed in detail patient's concerns above.  Primary concern is fall, however stable today and reviewed prior imaging which was negative for spinal cord compression-appointment made to discuss next week.  Discussed need for further appointments, and to establish care today.  Patient is agreeable to focusing on gathering history, reviewing chronic medical conditions, medications, and obtaining necessary lab work/screening/vaccinations.  T2DM HTN Last A1C of 10.1 on 02/22/2023. Trulicity  3 mg weekly, Levemir  23 U at bedtime, novolog  sliding scale with meals (10-14 U). Currently taking Carvedilol , hydrochlorothiazide . Needs eye exam, sees Dr. Loreli in [place] (history of eye injection).  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 Admitted on 02/25/2023 after a fall with TBI, was started on short course of Keppra  to prevent seizures. Has follow-up with neurology in March 2025. Currently taking Lipitor 80 mg, last LDL of 33 and well within goal given CVA history.  Hypothyroid Takes Levothyroxine , but doesn't know dose. Last TSH of 4.22.  PERTINENT  PMH / PSH: OSA, hypothyroidism, type 2 diabetes, TBI, tobacco use, CVA history, cervical stenosis (s/p ACDF C4-5 in 2024)   OBJECTIVE:   BP 120/60   Pulse 72   Ht 6' 1 (1.854 m)   Wt 223 lb (101.2 kg)   SpO2 99%   BMI 29.42 kg/m    General: NAD, pleasant Cardio: RRR, no MRG. Cap Refill <2s. Respiratory: CTAB, normal wob on RA GI: Abdomen is soft, not tender, not distended. BS present Skin: Warm and dry. Small scabbed ulcer on left lower extremity, no  signs of infection. Neuro: Alert and oriented x4 CN II: PERRL CN III, IV,VI: EOMI CVII: Symmetric smile and brow raise CN VIII: Normal hearing CN IX,X: Symmetric palate raise  UE and LE strength 5/5 on Right Left UE 3/5, left LE 4/5 (chronic deficit)   ASSESSMENT/PLAN:   Assessment & Plan Type 2 diabetes mellitus without complication, with long-term current use of insulin  (HCC) Last A1C of 10.1. Currently taking Trulicity  3 mg weekly, Levemir  23 U at night, and Novolog  sliding scale with meals.  -Repeat A1C next week - UACR today - Continue home meds above - Instructed to obtain eye exam Essential hypertension Repeat in goal. -Continue carvedilol  12.5 mg  and hydrochlorothiazide  12.5 mg. Hypothyroidism, unspecified type Check TSH today. -Continue home synthroid    Follow-up recommendations A1c at next visit Discuss falls. Consider MRI if c/f for compression. Remind patient to obtain colonoscopy records, if obtained order colonoscopy as appropriate Make sure medicare AWV is scheduled Patient was instructed to bring all medications to next appointment for med rec  Gladis Church, DO [facility]

## 2023-04-24 NOTE — Assessment & Plan Note (Signed)
Repeat in goal. -Continue carvedilol 12.5 mg  and hydrochlorothiazide 12.5 mg.

## 2023-04-24 NOTE — Addendum Note (Signed)
Addended by: Aquilla Solian on: 04/24/2023 10:39 AM   Modules accepted: Orders

## 2023-04-25 LAB — TSH RFX ON ABNORMAL TO FREE T4: TSH: 10.3 u[IU]/mL — ABNORMAL HIGH (ref 0.450–4.500)

## 2023-04-25 LAB — MICROALBUMIN / CREATININE URINE RATIO
Creatinine, Urine: 145.2 mg/dL
Microalb/Creat Ratio: 430 mg/g{creat} — ABNORMAL HIGH (ref 0–29)
Microalbumin, Urine: 624.6 ug/mL

## 2023-04-25 LAB — HEPATITIS C ANTIBODY: Hep C Virus Ab: NONREACTIVE

## 2023-04-25 LAB — T4F: T4,Free (Direct): 0.83 ng/dL (ref 0.82–1.77)

## 2023-04-26 ENCOUNTER — Telehealth: Payer: Self-pay | Admitting: Student

## 2023-04-26 DIAGNOSIS — E1129 Type 2 diabetes mellitus with other diabetic kidney complication: Secondary | ICD-10-CM

## 2023-04-26 MED ORDER — LOSARTAN POTASSIUM 25 MG PO TABS
25.0000 mg | ORAL_TABLET | Freq: Every day | ORAL | 3 refills | Status: DC
Start: 2023-04-26 — End: 2024-02-03

## 2023-04-26 NOTE — Telephone Encounter (Signed)
 Attempted call x4 (alternating home and mobile number). HIPAA compliant VM left on mobile phone.    When patient returns call please inform them: -Your urine-creatine ratio is elevated, and this can be a sign of damage to your kidneys from diabetes. There are medications to treat this. I will send in Losartan  25 mg to take daily. Please watch your BP while on this medication to make sure your pressures do not go below 90/60. We can discuss further at next appointment on February 12.  - Your TSH is elevated. We may need to increase your synthroid  medication. Please remember to bring all of your medications with you to our next appointment so we can be sure you are taking the correct dose before we change it.

## 2023-04-29 ENCOUNTER — Ambulatory Visit (INDEPENDENT_AMBULATORY_CARE_PROVIDER_SITE_OTHER): Payer: Self-pay

## 2023-04-29 DIAGNOSIS — I639 Cerebral infarction, unspecified: Secondary | ICD-10-CM | POA: Diagnosis not present

## 2023-04-30 LAB — CUP PACEART REMOTE DEVICE CHECK
Date Time Interrogation Session: 20250210080032
Implantable Pulse Generator Implant Date: 20240219
Pulse Gen Serial Number: 511022381

## 2023-05-01 ENCOUNTER — Ambulatory Visit: Payer: Medicare HMO | Admitting: Student

## 2023-05-01 ENCOUNTER — Encounter: Payer: Self-pay | Admitting: Student

## 2023-05-01 VITALS — BP 120/80 | HR 61 | Ht 73.0 in | Wt 233.0 lb

## 2023-05-01 DIAGNOSIS — E039 Hypothyroidism, unspecified: Secondary | ICD-10-CM | POA: Diagnosis not present

## 2023-05-01 DIAGNOSIS — Z794 Long term (current) use of insulin: Secondary | ICD-10-CM

## 2023-05-01 DIAGNOSIS — E119 Type 2 diabetes mellitus without complications: Secondary | ICD-10-CM | POA: Diagnosis not present

## 2023-05-01 DIAGNOSIS — I63411 Cerebral infarction due to embolism of right middle cerebral artery: Secondary | ICD-10-CM

## 2023-05-01 DIAGNOSIS — R2689 Other abnormalities of gait and mobility: Secondary | ICD-10-CM

## 2023-05-01 DIAGNOSIS — S069XAD Unspecified intracranial injury with loss of consciousness status unknown, subsequent encounter: Secondary | ICD-10-CM | POA: Diagnosis not present

## 2023-05-01 DIAGNOSIS — I10 Essential (primary) hypertension: Secondary | ICD-10-CM

## 2023-05-01 LAB — POCT GLYCOSYLATED HEMOGLOBIN (HGB A1C): HbA1c, POC (controlled diabetic range): 11.5 % — AB (ref 0.0–7.0)

## 2023-05-01 NOTE — Progress Notes (Signed)
[employer]

## 2023-05-01 NOTE — Patient Instructions (Addendum)
It was great to see you! Thank you for allowing me to participate in your care!   I recommend that you always bring your medications to each appointment as this makes it easy to ensure we are on the correct medications and helps [place] not miss when refills are needed.  Our plans for today:  -Follow-up in 3 to 4 weeks - Please pick up and take losartan 25 mg daily -Please schedule appointment appointment for your Medicare annual wellness -Please schedule appointment upfront with Dr. Raymondo Band, our pharmacist -I have sent a referral for home health nurse to assist with medication management at home  Take care and seek immediate care sooner if you develop any concerns. Please remember to show up 15 minutes before your scheduled appointment time!  Tiffany Kocher, DO [facility]

## 2023-05-01 NOTE — Assessment & Plan Note (Signed)
Poorly controlled type 2 diabetes.  Questionable medication compliance with insulin.  Proteinuria present. -Start losartan 25 mg daily -Referral to pharmacy clinic for diabetes management including CGM -Home health nursing for medication assistance and education - Follow-up in 2-4 weeks

## 2023-05-01 NOTE — Assessment & Plan Note (Signed)
TSH 10.3.  T4 WNL.  Patient reports intermittently missing Synthroid dosing.  He struggles with taking medication accurately.  Offered pill packing pharmacy, patient declines. - Recheck in 6-8 weeks, hopeful home health nursing will be able to assist in medication compliance

## 2023-05-01 NOTE — Assessment & Plan Note (Signed)
Significant mobility issues 2/2 residual left-sided deficits from CVA.  Previously in physical therapy, patient did not feel benefit.  Additionally, question if forgetfulness related to CVA and previous TBI.  Given his difficulty with ambulating, and lack of transportation he would benefit from home health assistance as outpatient program would be unsuccessful due to transportation mobility issues. -Home health nursing as above for medication adherence

## 2023-05-01 NOTE — Progress Notes (Signed)
    SUBJECTIVE:   CHIEF COMPLAINT / HPI:   Hypothyroidism Discussed elevated TSH, as patient did not receive my voicemail from last week.  Reports taking close 125 mcg Synthroid daily-however this was taken sometimes.  Proteinuria Discussed elevated microalbumin creatinine ratio, and patient agreeable to starting losartan.  Type 2 diabetes A1c of 11.5 today.  Reports taking 3 mg Trulicity weekly, only transiently bedtime and NovoLog/scale x 10-14 units, although he does endorse that he misses some insulin every now and then.  I do have concern the patient is noncompliant with his medication regimen.  He is interested in CGM.  Patient reports significant difficulties with remembering to take his medications, believes memory has been a problem since prior strokes.  Additionally, limited mobility due to chronic left-sided deficits.  Patient also has transportation issues which make it difficult for him to get to the pharmacy into our clinic.   OBJECTIVE:   BP (!) 140/80   Pulse 61   Ht 6\' 1"  (1.854 m)   Wt 233 lb (105.7 kg)   SpO2 100%   BMI 30.74 kg/m    General: NAD, pleasant Cardio: RRR, no MRG. Cap Refill <2s, bilateral venous stasis insuffiencey (See picture below) Respiratory: CTAB, normal wob on RA GI: Abdomen is soft, not tender, not distended. BS present  ASSESSMENT/PLAN:   Assessment & Plan Type 2 diabetes mellitus without complication, with long-term current use of insulin (HCC) Poorly controlled type 2 diabetes.  Questionable medication compliance with insulin.  Proteinuria present. -Start losartan 25 mg daily -Referral to pharmacy clinic for diabetes management including CGM -Home health nursing for medication assistance and education - Follow-up in 2-4 weeks Hypothyroidism, unspecified type TSH 10.3.  T4 WNL.  Patient reports intermittently missing Synthroid dosing.  He struggles with taking medication accurately.  Offered pill packing pharmacy, patient  declines. - Recheck in 6-8 weeks, hopeful home health nursing will be able to assist in medication compliance Cerebrovascular accident (CVA) due to embolism of right middle cerebral artery (HCC) Significant mobility issues 2/2 residual left-sided deficits from CVA.  Previously in physical therapy, patient did not feel benefit.  Additionally, question if forgetfulness related to CVA and previous TBI.  Given his difficulty with ambulating, and lack of transportation he would benefit from home health assistance as outpatient program would be unsuccessful due to transportation mobility issues. -Home health nursing as above for medication adherence  Tiffany Kocher, DO [facility]

## 2023-05-04 ENCOUNTER — Encounter: Payer: Self-pay | Admitting: Cardiology

## 2023-05-08 ENCOUNTER — Other Ambulatory Visit: Payer: Self-pay | Admitting: Physical Medicine and Rehabilitation

## 2023-05-13 ENCOUNTER — Telehealth: Payer: Self-pay | Admitting: Physical Medicine & Rehabilitation

## 2023-05-13 ENCOUNTER — Telehealth: Payer: Self-pay

## 2023-05-13 NOTE — Telephone Encounter (Signed)
 Patient was told when he left hospital in December not to take Asprin. He is having headaches and wanting to know if he can take Asprin now. Please call patient.

## 2023-05-13 NOTE — Telephone Encounter (Signed)
 Patient calls nurse line regarding referral for home health nursing services.   He states that he has had a bad experience with [facility] in the past and would prefer for referral to be sent to a different home health agency.   Explained to patient that I would send message to referral coordinator, however, there may be limitations due to staffing, insurance coverage.   Patient verbalizes understanding.   Veronda Prude, RN

## 2023-05-14 NOTE — Telephone Encounter (Signed)
 Called patient and left Voicemail .

## 2023-05-14 NOTE — Telephone Encounter (Signed)
 I literally have not seen Joseph Ortega since July. He was advised to hold ASA when he left rehab. He should have followed up with neurology regarding further recommendations for stroke prophylaxis/prevention.   This was in his 03/11/23 AVS: [facility] Follow up.   Specialty: Neurology Why: office will call you with follow up appointment Contact information: [address] [place] [address] [phone]  Did someone call him?  Furthermore, ASA will not likely treat his headaches. He was receiving tramadol for headaches when he left rehab in December. He was given some at discharge.  Pt needs an OV for assessment. If we're going to prescribe tramadol to get him by in the short term, he can see Riley Lam initially.   thx

## 2023-05-17 ENCOUNTER — Telehealth: Payer: Self-pay

## 2023-05-17 NOTE — Telephone Encounter (Signed)
 Called patient, confirmed identity.  Patient called Regarding aspirin use.  He was told not to use aspirin after his last admission.  He was discharged on 12/9 after a fall with LOC and was found to have a 13-13 mm hemorrhagic parenchymal contusion in his right temporal lobe.  Aspirin was to be held until his follow-up with neurology, which is on June 03, 2023.  Discussed using Tylenol or Excedrin without aspirin for his mild headache.  He is alert and oriented on the phone.  Patient expressed understanding agreement plan.

## 2023-05-17 NOTE — Telephone Encounter (Signed)
 Patient calls nurse line requesting to speak with PCP.   He reports he was taken off Aspirin during his hospital stay. However, he reports he would like to resume taking. He states he prefers Aspirin over Tylenol when he has a mild headache.   He reports he has an active headache now. He denies any chest pains, shortness of breath or vision changes.   He reports he has Topamax at home and Tylenol.  Will forward to PCP for advisement on Aspirin.

## 2023-05-24 ENCOUNTER — Other Ambulatory Visit: Payer: Self-pay | Admitting: Physical Medicine and Rehabilitation

## 2023-05-30 ENCOUNTER — Other Ambulatory Visit: Payer: Self-pay | Admitting: Student

## 2023-05-30 DIAGNOSIS — I63411 Cerebral infarction due to embolism of right middle cerebral artery: Secondary | ICD-10-CM

## 2023-05-30 DIAGNOSIS — R2689 Other abnormalities of gait and mobility: Secondary | ICD-10-CM

## 2023-05-30 DIAGNOSIS — S069XAD Unspecified intracranial injury with loss of consciousness status unknown, subsequent encounter: Secondary | ICD-10-CM

## 2023-05-30 NOTE — Progress Notes (Signed)
 Received request for new HH orders. As below from progress note on 2/12  Cerebrovascular accident (CVA) due to embolism of right middle cerebral artery (HCC) Significant mobility issues 2/2 residual left-sided deficits from CVA.  Previously in physical therapy, patient did not feel benefit.  Additionally, question if forgetfulness related to CVA and previous TBI.  Given his difficulty with ambulating, and lack of transportation he would benefit from home health assistance as outpatient program would be unsuccessful due to transportation mobility issues. -Home health nursing as above for medication adherence

## 2023-05-31 ENCOUNTER — Encounter (HOSPITAL_COMMUNITY)

## 2023-05-31 ENCOUNTER — Telehealth: Payer: Self-pay

## 2023-05-31 ENCOUNTER — Ambulatory Visit

## 2023-05-31 ENCOUNTER — Telehealth: Payer: Self-pay | Admitting: Student

## 2023-05-31 VITALS — BP 158/71 | HR 61 | Ht 73.0 in | Wt 233.2 lb

## 2023-05-31 DIAGNOSIS — R600 Localized edema: Secondary | ICD-10-CM | POA: Diagnosis not present

## 2023-05-31 DIAGNOSIS — I5020 Unspecified systolic (congestive) heart failure: Secondary | ICD-10-CM | POA: Insufficient documentation

## 2023-05-31 DIAGNOSIS — I10 Essential (primary) hypertension: Secondary | ICD-10-CM | POA: Diagnosis not present

## 2023-05-31 DIAGNOSIS — I5022 Chronic systolic (congestive) heart failure: Secondary | ICD-10-CM | POA: Insufficient documentation

## 2023-05-31 LAB — D-DIMER, QUANTITATIVE: D-DIMER: 0.62 mg{FEU}/L — ABNORMAL HIGH (ref 0.00–0.49)

## 2023-05-31 NOTE — Progress Notes (Signed)
    SUBJECTIVE:   CHIEF COMPLAINT / HPI:   Joseph Ortega is a 68 y.o. male  presenting for concern of his left leg swelling, mild intermittent pain in the back of his leg and increased redness.  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  He reports he is able to ambulate some at home with a walker but this is difficult for him.  Per his wife, she noted his leg was looking more red, swollen and he endorsed some mild pain in his left calf.  He has never had a blood clot before, he is not currently on anticoagulation, he denies recent travel, mobility is difficult secondary to his left-sided weakness from strokes.    PERTINENT  PMH / PSH: Reviewed and updated   OBJECTIVE:   BP (!) 158/71   Pulse 61   Ht 6\' 1"  (1.854 m)   Wt 233 lb 3.2 oz (105.8 kg)   SpO2 100%   BMI 30.77 kg/m   Chronically ill-appearing, no acute distress, ambulates with walker with severe and profound left-sided weakness Cardio: Regular rate, regular rhythm, no murmurs on exam. Pulm: Clear, no wheezing, no crackles. No increased work of breathing Abdominal: bowel sounds present, soft, non-tender, non-distended Extremities: Chronic lymphedema noted, of note left side is significantly larger than right side with accompanied increased redness and swelling.  Does not appear to be infected and there are no open wounds.  Pulses are difficult to palpate secondary to edema.  Calf is not tender to palpation on exam.     04/24/2023   10:14 AM 10/10/2022   10:31 AM 08/15/2022   10:09 AM  PHQ9 SCORE ONLY  PHQ-9 Total Score 3 0 0      ASSESSMENT/PLAN:   Edema of left lower leg Differential includes DVT with Wells score of 4.  Of note left leg does periodically swell but it with increased redness and mild pain in the calf cannot reasonably rule out DVT without ultrasound.    Social situation is complicated.  Patient does not have anyone that is  able to drive him and he relies on [employer] transportation.  He reports this takes 2 weeks to set up for appointments which would make outpatient workup difficult.  Upon initial discussion with the patient he refused to go to the emergency department for evaluation.  Attempted to pursue outpatient workup with D-dimer and stat DVT ultrasound.  Patient was initially scheduled for an ultrasound at [facility] at 2 PM this afternoon.  We also collected money from the office to help pay his cab fair.  When patient realized that money collected in the office would not cover his transportation he refused ultrasound outpatient and agreed for emergency room workup.  Patient reported he would like to return home let his wife know what is going on and then called EMS for transport to the hospital for DVT workup.  Continued collection of D-dimer.  If negative can reasonably rule out DVT.  With patient's history of hemorrhagic strokes, hematoma, and significant fall risk will need to use caution with blood thinners.     Glendale Chard, DO  [facility]

## 2023-05-31 NOTE — Telephone Encounter (Signed)
 Called patient to discuss positive d-dimer results. HIPAA compliant voicemail. Patient needs to go to the ED due to possible clot in his leg.   Called cell phone number x2. Patient answered and then hung up twice. Unable to leave voicemail.   Glendale Chard, DO [facility], PGY-2 05/31/23 4:44 PM

## 2023-05-31 NOTE — Telephone Encounter (Signed)
 Per Dr. Samara Deist request called Vas department to cancel the Vas [place] appointment set for 2pm at [facility] today. (05/31/2023) Patient was having transportation issues and decided to go to the ED instead.  Drusilla Kanner, CMA

## 2023-05-31 NOTE — Telephone Encounter (Signed)
 [employer] and spoke with Nicole Cella to schedule an STAT Vas [place] appointment for patient.  Was able to schedule [place] appointment while patient was in office.  For 05/31/2023 at [facility] at 2pm and for the patient to go through the main entrance and to go to the receptionist and they would guide him to the destination.  Provided patient with this information.  Drusilla Kanner, CMA

## 2023-05-31 NOTE — Assessment & Plan Note (Addendum)
 Differential includes DVT with Wells score of 4.  Of note left leg does periodically swell but it with increased redness and mild pain in the calf cannot reasonably rule out DVT without ultrasound.    Social situation is complicated.  Patient does not have anyone that is able to drive him and he relies on [employer] transportation.  He reports this takes 2 weeks to set up for appointments which would make outpatient workup difficult.  Upon initial discussion with the patient he refused to go to the emergency department for evaluation.  Attempted to pursue outpatient workup with D-dimer and stat DVT ultrasound.  Patient was initially scheduled for an ultrasound at [facility] at 2 PM this afternoon.  We also collected money from the office to help pay his cab fair.  When patient realized that money collected in the office would not cover his transportation he refused ultrasound outpatient and agreed for emergency room workup.  Patient reported he would like to return home let his wife know what is going on and then called EMS for transport to the hospital for DVT workup.  Continued collection of D-dimer.  If negative can reasonably rule out DVT.  With patient's history of hemorrhagic strokes, hematoma, and significant fall risk will need to use caution with blood thinners.

## 2023-05-31 NOTE — Patient Instructions (Signed)
 My recommendation is for you to go to the emergency room to have an ultrasound of your lower leg to make sure you do not have a clot.  I understand that you have transportation issues we will provide you money to get an Benedetto Goad to go to your ultrasound.  You have an ultrasound at [facility] Go to the main entrance and let the receptionist know you have an ultrasound appointment at 2 PM.  I am sending you home with a sample of a blood thinner called Eliquis.  If your ultrasound comes back positive for and blood clot you will need to take 2 tablets twice a day.  I am giving you enough to get you through the weekend and if you do have a blood thinner I will send in a prescription to your pharmacy.  You will need to follow-up in at least 1 week or 2 weeks if transportation does not allow it to make sure that we are following you closely.

## 2023-06-01 LAB — BASIC METABOLIC PANEL
BUN/Creatinine Ratio: 18 (ref 10–24)
BUN: 16 mg/dL (ref 8–27)
CO2: 27 mmol/L (ref 20–29)
Calcium: 9.5 mg/dL (ref 8.6–10.2)
Chloride: 102 mmol/L (ref 96–106)
Creatinine, Ser: 0.89 mg/dL (ref 0.76–1.27)
Glucose: 317 mg/dL — ABNORMAL HIGH (ref 70–99)
Potassium: 4.8 mmol/L (ref 3.5–5.2)
Sodium: 142 mmol/L (ref 134–144)
eGFR: 95 mL/min/{1.73_m2} (ref >59)

## 2023-06-03 ENCOUNTER — Emergency Department (HOSPITAL_BASED_OUTPATIENT_CLINIC_OR_DEPARTMENT_OTHER)

## 2023-06-03 ENCOUNTER — Ambulatory Visit (INDEPENDENT_AMBULATORY_CARE_PROVIDER_SITE_OTHER): Payer: Medicare HMO | Admitting: Neurology

## 2023-06-03 ENCOUNTER — Encounter (HOSPITAL_COMMUNITY): Payer: Self-pay

## 2023-06-03 ENCOUNTER — Emergency Department (HOSPITAL_COMMUNITY)
Admission: EM | Admit: 2023-06-03 | Discharge: 2023-06-03 | Disposition: A | Discharge status: Home / Self Care | Attending: Emergency Medicine | Admitting: Emergency Medicine

## 2023-06-03 ENCOUNTER — Encounter: Payer: Self-pay | Admitting: Student

## 2023-06-03 ENCOUNTER — Ambulatory Visit (INDEPENDENT_AMBULATORY_CARE_PROVIDER_SITE_OTHER): Payer: Self-pay

## 2023-06-03 ENCOUNTER — Encounter: Payer: Self-pay | Admitting: Neurology

## 2023-06-03 VITALS — BP 148/69 | HR 54 | Ht 73.0 in | Wt 236.0 lb

## 2023-06-03 DIAGNOSIS — Z794 Long term (current) use of insulin: Secondary | ICD-10-CM | POA: Diagnosis not present

## 2023-06-03 DIAGNOSIS — F17200 Nicotine dependence, unspecified, uncomplicated: Secondary | ICD-10-CM | POA: Diagnosis not present

## 2023-06-03 DIAGNOSIS — R208 Other disturbances of skin sensation: Secondary | ICD-10-CM

## 2023-06-03 DIAGNOSIS — Z7982 Long term (current) use of aspirin: Secondary | ICD-10-CM | POA: Diagnosis not present

## 2023-06-03 DIAGNOSIS — I639 Cerebral infarction, unspecified: Secondary | ICD-10-CM

## 2023-06-03 DIAGNOSIS — H53462 Homonymous bilateral field defects, left side: Secondary | ICD-10-CM

## 2023-06-03 DIAGNOSIS — I10 Essential (primary) hypertension: Secondary | ICD-10-CM | POA: Diagnosis not present

## 2023-06-03 DIAGNOSIS — G8110 Spastic hemiplegia affecting unspecified side: Secondary | ICD-10-CM | POA: Diagnosis not present

## 2023-06-03 DIAGNOSIS — I63411 Cerebral infarction due to embolism of right middle cerebral artery: Secondary | ICD-10-CM

## 2023-06-03 DIAGNOSIS — Z79899 Other long term (current) drug therapy: Secondary | ICD-10-CM | POA: Diagnosis not present

## 2023-06-03 DIAGNOSIS — E039 Hypothyroidism, unspecified: Secondary | ICD-10-CM | POA: Diagnosis not present

## 2023-06-03 DIAGNOSIS — E119 Type 2 diabetes mellitus without complications: Secondary | ICD-10-CM | POA: Diagnosis not present

## 2023-06-03 DIAGNOSIS — M7989 Other specified soft tissue disorders: Secondary | ICD-10-CM

## 2023-06-03 DIAGNOSIS — M79662 Pain in left lower leg: Secondary | ICD-10-CM

## 2023-06-03 DIAGNOSIS — R600 Localized edema: Secondary | ICD-10-CM | POA: Diagnosis not present

## 2023-06-03 LAB — CUP PACEART REMOTE DEVICE CHECK
Date Time Interrogation Session: 20250317070315
Implantable Pulse Generator Implant Date: 20240219
Pulse Gen Model: 5000
Pulse Gen Serial Number: 511022381

## 2023-06-03 MED ORDER — ASPIRIN 81 MG PO TBEC: 81.0000 mg | DELAYED_RELEASE_TABLET | Freq: Every day | ORAL | 12 refills | Status: DC

## 2023-06-03 NOTE — Progress Notes (Signed)
 Ankle-brachial index completed. Please see CV Procedures for preliminary results.  Shona Simpson, RVT 06/03/23 6:35 PM

## 2023-06-03 NOTE — ED Provider Triage Note (Signed)
 Emergency Medicine Provider Triage Evaluation Note  Joseph Ortega , a 68 y.o. male  was evaluated in triage.  Pt complains of left leg swelling and intermittent pain for around 1 month.  Reports pain around 2/10.  Some swelling peripherally of both lower extremities.  Denies chest pain, denies shortness of breath.  No previous history of blood clots.  Sent by PCP to rule out blood clot.  Review of Systems  Positive: Left leg swelling Negative: Chest pain, shortness of breath  Physical Exam  BP (!) 182/81 (BP Location: Right Arm)   Pulse (!) 58   Temp 98.2 F (36.8 C)   Resp 16   Ht 6\' 1"  (1.854 m)   Wt 107 kg   SpO2 96%   BMI 31.14 kg/m  Gen:   Awake, no distress   Resp:  Normal effort  MSK:   Moves extremities without difficulty  Other:  Significant swelling of the left lower extremity greater than the right, no pitting edema noted.  Thready 1+ DP, PT pulses are palpable on the affected left lower extremity.  Medical Decision Making  Medically screening exam initiated at 12:27 PM.  Appropriate orders placed.  Vida Roller was informed that the remainder of the evaluation will be completed by another provider, this initial triage assessment does not replace that evaluation, and the importance of remaining in the ED until their evaluation is complete  Workup initiated in triage    Olene Floss, PA-C 06/03/23 1230

## 2023-06-03 NOTE — ED Triage Notes (Signed)
 Pt c/o LLE swelling and intermittent pain x1 month.  Pain score 2/10.  Pt reports PCP sent him to rule out a DVT.  Swelling and discoloration noted.

## 2023-06-03 NOTE — Patient Instructions (Signed)
 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 I also advised the patient to eat a healthy diet with plenty of whole grains, cereals, fruits and vegetables, exercise regularly and maintain ideal body weight .check screening carotid ultrasound study.  Refer to Dr. Wynn Banker for evaluation for possible  Vivistim program to improve left upper extremity function.  He was advised to use his wheeled walker at all times and we discussed fall safety precautions.  Followup in the future with my nurse practitioner in 6 months or call earlier if necessary.  Stroke Prevention Some medical conditions and behaviors can lead to a higher chance of having a stroke. You can help prevent a stroke by eating healthy, exercising, not smoking, and managing any medical conditions you have. Stroke is a leading cause of functional impairment. Primary prevention is particularly important because a majority of strokes are first-time events. Stroke changes the lives of not only those who experience a stroke but also their family and other caregivers. How can this condition affect me? A stroke is a medical emergency and should be treated right away. A stroke can lead to brain damage and can sometimes be life-threatening. If a person gets medical treatment right away, there is a better chance of surviving and recovering from a stroke. What can increase my risk? The following medical conditions may increase your risk of a stroke: Cardiovascular disease. High blood pressure (hypertension). Diabetes. High  cholesterol. Sickle cell disease. Blood clotting disorders (hypercoagulable state). Obesity. Sleep disorders (obstructive sleep apnea). Other risk factors include: Being older than age 28. Having a history of blood clots, stroke, or mini-stroke (transient ischemic attack, TIA). Genetic factors, such as race, ethnicity, or a family history of stroke. Smoking cigarettes or using other tobacco products. Taking birth control pills, especially if you also use tobacco. Heavy use of alcohol or drugs, especially cocaine and methamphetamine. Physical inactivity. What actions can I take to prevent this? Manage your health conditions High cholesterol levels. Eating a healthy diet is important for preventing high cholesterol. If cholesterol cannot be managed through diet alone, you may need to take medicines. Take any prescribed medicines to control your cholesterol as told by your health care provider. Hypertension. To reduce your risk of stroke, try to keep your blood pressure below 130/80. Eating a healthy diet and exercising regularly are important for controlling blood pressure. If these steps are not enough to manage your blood pressure, you may need to take medicines. Take any prescribed medicines to control hypertension as told by your health care provider. Ask your health care provider if you should monitor your blood pressure at home. Have your blood pressure checked every year, even if your blood pressure is normal. Blood pressure increases with age and some medical conditions. Diabetes. Eating a healthy diet and exercising regularly are important parts of managing your blood sugar (glucose). If your blood sugar cannot be managed through diet and exercise, you may need to take medicines. Take any prescribed medicines to control your diabetes as told by your health care provider. Get evaluated for obstructive sleep apnea. Talk to your health care provider about getting a sleep evaluation  if  you snore a lot or have excessive sleepiness. Make sure that any other medical conditions you have, such as atrial fibrillation or atherosclerosis, are managed. Nutrition Follow instructions from your health care provider about what to eat or drink to help manage your health condition. These instructions may include: Reducing your daily calorie intake. Limiting how much salt (sodium) you use to 1,500 milligrams (mg) each day. Using only healthy fats for cooking, such as olive oil, canola oil, or sunflower oil. Eating healthy foods. You can do this by: Choosing foods that are high in fiber, such as whole grains, and fresh fruits and vegetables. Eating at least 5 servings of fruits and vegetables a day. Try to fill one-half of your plate with fruits and vegetables at each meal. Choosing lean protein foods, such as lean cuts of meat, poultry without skin, fish, tofu, beans, and nuts. Eating low-fat dairy products. Avoiding foods that are high in sodium. This can help lower blood pressure. Avoiding foods that have saturated fat, trans fat, and cholesterol. This can help prevent high cholesterol. Avoiding processed and prepared foods. Counting your daily carbohydrate intake.  Lifestyle If you drink alcohol: Limit how much you have to: 0-1 drink a day for women who are not pregnant. 0-2 drinks a day for men. Know how much alcohol is in your drink. In the [place] one drink equals one 12 oz bottle of beer ( ), one 5 oz glass of wine ( ), or one 1 oz glass of hard liquor (44mL). Do not use any products that contain nicotine or tobacco. These products include cigarettes, chewing tobacco, and vaping devices, such as e-cigarettes. If you need help quitting, ask your health care provider. Avoid secondhand smoke. Do not use drugs. Activity  Try to stay at a healthy weight. Get at least 30 minutes of exercise on most days, such as: Fast walking. Biking. Swimming. Medicines Take  over-the-counter and prescription medicines only as told by your health care provider. Aspirin or blood thinners (antiplatelets or anticoagulants) may be recommended to reduce your risk of forming blood clots that can lead to stroke. Avoid taking birth control pills. Talk to your health care provider about the risks of taking birth control pills if: You are over 15 years old. You smoke. You get very bad headaches. You have had a blood clot. Where to find more information American Stroke Association: www.strokeassociation.org Get help right away if: You or a loved one has any symptoms of a stroke. "BE FAST" is an easy way to remember the main warning signs of a stroke: B - Balance. Signs are dizziness, sudden trouble walking, or loss of balance. E - Eyes. Signs are trouble seeing or a sudden change in vision. F - Face. Signs are sudden weakness or numbness of the face, or the face or eyelid drooping on one side. A - Arms. Signs are weakness or numbness in an arm. This happens suddenly and usually on one side of the body. S - Speech. Signs are sudden trouble speaking, slurred speech, or trouble understanding what people say. T - Time. Time to call emergency services. Write down what time symptoms started. You or a loved one has other signs of a stroke, such as: A sudden, severe headache with no known cause. Nausea or vomiting. Seizure. These symptoms may represent a serious problem that is an emergency. Do not wait to see if the symptoms will go away. Get medical help right away. Call your local emergency services (911 in the [place]  Do not drive yourself to the hospital. Summary You can help to prevent a stroke by eating healthy, exercising, not smoking, limiting alcohol intake, and managing any medical conditions you may have. Do not use any products that contain nicotine or tobacco. These include cigarettes, chewing tobacco, and vaping devices, such as e-cigarettes. If you need help quitting,  ask your health care provider. Remember "BE FAST" for warning signs of a stroke. Get help right away if you or a loved one has any of these signs. This information is not intended to replace advice given to you by your health care provider. Make sure you discuss any questions you have with your health care provider. Document Revised: 02/05/2022 Document Reviewed: 02/05/2022 Elsevier Patient Education  2024 [employer].

## 2023-06-03 NOTE — ED Notes (Signed)
Paged VAS Tech

## 2023-06-03 NOTE — Telephone Encounter (Signed)
 Patient returns call to nurse line.   Discussed positive D Dimer and instructed ED evaluation.   Patient agreed with plan.

## 2023-06-03 NOTE — Progress Notes (Signed)
 [facility] [address] [place]. [place] [address] [phone]       OFFICE FOLLOW-UP NOTE  [name] Date of Birth:  13-Sep-1955 Medical Record Number:  [id number]   HPI: [name] is a pleasant 68 year old male seen today for initial office follow-up visit following hospital consultation for stroke in February 2024.  History is obtained from patient and review of electronic medical records and I personally reviewed pertinent available imaging films in PACS. He has past medical history of diabetes, hypertension, hyperlipidemia, sleep apnea, obesity, anxiety, arthritis and thyroid disease.  He presented on 05/02/2022 with sudden onset of left hemiparesis and headache as well as some chest pain overnight.  CT scan of the head on admission showed a fairly large hemorrhagic infarct in the right MCA distribution measuring 7.6 x 3.6 x 4.1 cm.  MRI scan showed hemorrhagic infarct in the right temporoparietal region similar in appearance to the previously CT scan with cytotoxic edema and mass effect on the right lateral ventricle with 2 mm right-to-left midline shift.  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  2D echo showed ejection fraction of 50 to 55%.  Loop recorder placed prior to discharge to rehab and so far not shown paroxysmal A-fib.  LDL cholesterol 33 mg percent.  Hemoglobin A1c was 8.3.  Patient was felt to have a hemorrhagic spontaneous conversion to right MCA infarct.  Started on aspirin alone for stroke prevention and not on dual antiplatelet due to hemorrhagic conversion.  Started on Topamax for his headaches.  He went to inpatient rehab.  Patient states is done well he is currently living at home.  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  He is ambulating without wheeled walker.  He is able to wear his clothes and use a shower and to most  activities of daily living by himself though it takes him longer.  He is able to bend his fingers in his left hand though his grip strength is still weak.  He has not been treated with any Botox.  His last hemoglobin A1c was yet elevated at 11.5 on 05/01/2023.  He has an upcoming appointment with the pharmacist to discuss medications for his diabetes.  Last repeat CT scan of the head on 04/09/2023 showed encephalomalacia in the right temporoparietal occipital region without any acute abnormality.  Last loop recorder interrogation on 04/29/2023 showed no evidence of A-fib. ROS:   14 system review of systems is positive for weakness, difficulty walking, stiffness, spasticity, bruising all other systems negative  PMH:  Past Medical History:  Diagnosis Date   Anxiety    Arthritis    Depression    Diabetes mellitus without complication (HCC)    Headache    Hypertension    Hypertensive crisis 02/22/2023   Hypothyroidism    Sleep apnea    wears CPAP   Stroke (HCC)    1 stroke 2/24, and 2 strokes on 03/09/21   Thyroid disease     Social History:  Social History   Socioeconomic History   Marital status: Married    Spouse name: Not on file   Number of children: 3   Years of education: Not on file   Highest education level: Not on file  Occupational History   Not on file  Tobacco Use   Smoking status: Never   Smokeless tobacco: Never  Vaping Use  Vaping status: Never Used  Substance and Sexual Activity   Alcohol use: No   Drug use: No   Sexual activity: Not on file  Other Topics Concern   Not on file  Social History Narrative   Not on file   Social Drivers of Health   Financial Resource Strain: Not on file  Food Insecurity: No Food Insecurity (02/22/2023)   Hunger Vital Sign    Worried About Running Out of Food in the Last Year: Never true    Ran Out of Food in the Last Year: Never true  Transportation Needs: No Transportation Needs (02/22/2023)   PRAPARE - Therapist, art (Medical): No    Lack of Transportation (Non-Medical): No  Physical Activity: Not on file  Stress: Not on file  Social Connections: Unknown (08/01/2021)   [facility], [facility]   Social Network    Social Network: Not on file  Intimate Partner Violence: Not At Risk (02/22/2023)   Humiliation, Afraid, Rape, and Kick questionnaire    Fear of Current or Ex-Partner: No    Emotionally Abused: No    Physically Abused: No    Sexually Abused: No    Medications:   Current Outpatient Medications on File Prior to Visit  Medication Sig Dispense Refill   acetaminophen (TYLENOL) 325 MG tablet Take 1-2 tablets (325-650 mg total) by mouth every 4 (four) hours as needed for mild pain (pain score 1-3).     ascorbic acid (VITAMIN C) 500 MG tablet Take 1 tablet (500 mg total) by mouth daily with supper.     busPIRone (BUSPAR) 10 MG tablet Take 10 mg by mouth 3 (three) times daily.     carvedilol (COREG) 12.5 MG tablet Take 1 tablet (12.5 mg total) by mouth 2 (two) times daily with a meal. 60 tablet 0   clotrimazole (LOTRIMIN) 1 % cream Apply topically 2 (two) times daily. 28 g 0   cyclobenzaprine (FLEXERIL) 10 MG tablet Take 1 tablet (10 mg total) by mouth 2 (two) times daily as needed for muscle spasms. 10 tablet 0   diclofenac Sodium (VOLTAREN) 1 % GEL Apply 2 g topically 4 (four) times daily. 50 g 0   diphenhydrAMINE (BENADRYL) 25 mg capsule Take 25 mg by mouth every 6 (six) hours as needed for allergies.     docusate sodium (COLACE) 100 MG capsule Take 1 capsule (100 mg total) by mouth 2 (two) times daily. 60 capsule 0   Dulaglutide (TRULICITY) 3 MG/0.5ML SOPN Inject 3 mg into the skin once a week. (Patient taking differently: Inject 3 mg into the skin once a week. Inject on Sunday) 2 mL 0   DULoxetine (CYMBALTA) 30 MG capsule Take 30 mg by mouth 2 (two) times daily.     gabapentin (NEURONTIN) 300 MG capsule Take 1 capsule (300 mg total) by mouth 3 (three) times  daily. 90 capsule 4   hydrochlorothiazide (HYDRODIURIL) 12.5 MG tablet Take 1 tablet (12.5 mg total) by mouth 2 (two) times daily. 60 tablet 0   insulin aspart (NOVOLOG) 100 UNIT/ML injection Inject 0-15 Units into the skin 3 (three) times daily before meals. Maximum of 20 units     insulin detemir (LEVEMIR) 100 UNIT/ML FlexPen Inject 23 Units into the skin at bedtime.     levothyroxine (SYNTHROID) 125 MCG tablet Take 1 tablet (125 mcg total) by mouth daily at 6 (six) AM. 30 tablet 0   losartan (COZAAR) 25 MG tablet Take 1 tablet (  25 mg total) by mouth at bedtime. 90 tablet 3   Multiple Vitamins-Minerals (ALIVE MENS GUMMY MULTIVITAMINS) CHEW Chew 3 tablets by mouth daily.     potassium chloride SA (KLOR-CON M20) 20 MEQ tablet Take 1 tablet (20 mEq total) by mouth daily. 30 tablet 0   rosuvastatin (CRESTOR) 40 MG tablet Take 1 tablet (40 mg total) by mouth daily. 30 tablet 0   topiramate (TOPAMAX) 50 MG tablet Take 1 tablet (50 mg total) by mouth daily. 30 tablet 0   No current facility-administered medications on file prior to visit.    Allergies:  No Known Allergies  Physical Exam General: well developed, well nourished pleasant middle-age male, seated, in no evident distress Head: head normocephalic and atraumatic.  Neck: supple with no carotid or supraclavicular bruits Cardiovascular: regular rate and rhythm, no murmurs Musculoskeletal: no deformity Skin:  no rash/petichiae Vascular:  Normal pulses all extremities Vitals:   06/03/23 0948  BP: (!) 148/69  Pulse: (!) 54   Neurologic Exam Mental Status: Awake and fully alert. Oriented to place and time. Recent and remote memory intact. Attention span, concentration and fund of knowledge appropriate. Mood and affect appropriate.  Cranial Nerves: Fundoscopic exam reveals sharp disc margins. Pupils equal, briskly reactive to light. Extraocular movements full without nystagmus. Visual fields show dense left homonymous hemianopsia to  confrontation. Hearing intact. Facial sensation intact.  Mild left lower facial weakness.  Tongue, palate moves normally and symmetrically.  Motor: Spastic left hemiparesis with 3/5 left upper extremity strength with significant weakness of left grip and intrinsic hand muscles.  Left lower extremity strength is 4/5 with weakness of hip flexors and left foot drop and ankle dorsiflexor weakness.  Tone is increased on the left with spasticity.  Normal strength on the right. Sensory.: intact to touch ,pinprick .position and vibratory sensation.  Coordination: Rapid alternating movements normal in all extremities. Finger-to-nose and heel-to-shin performed accurately bilaterally. Gait and Station: Arises from chair with difficulty.  Uses a wheeled walker and walks with left foot drop and leg stiffness Reflexes: 2+ and asymmetric and brisker on the left.  Toes downgoing.   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  Vascular risk factors of hypertension, diabetes, hyperlipidemia.     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 I also advised the patient to eat a healthy diet with plenty of whole grains, cereals, fruits and vegetables, exercise regularly and maintain ideal body weight .check screening carotid ultrasound study.  Refer to Dr. Wynn Banker for evaluation for possible  Vivistim  program to improve left upper extremity function.  He was advised to use his wheeled walker at all times and we discussed fall safety precautions.  Followup in the future with my nurse practitioner in 6 months or call earlier if necessary.  Greater than 50% of time during this 40 minute visit was spent on counseling,explanation of diagnosis, planning of further management, discussion with patient and family and coordination of care Delia Heady, MD Note: This  document was prepared with digital dictation and possible smart phrase technology. Any transcriptional errors that result from this process are unintentional

## 2023-06-03 NOTE — Addendum Note (Signed)
 Addended by: Geralyn Flash D on: 06/03/2023 01:31 PM   Modules accepted: Orders

## 2023-06-03 NOTE — Progress Notes (Signed)
 [address]lin [address] [employer]

## 2023-06-03 NOTE — ED Notes (Signed)
 Wife Yerick Eggebrecht [phone] would like an update asap

## 2023-06-03 NOTE — Discharge Instructions (Signed)
 Keep legs elevated, use compression stockings 24/7, follow up with vascular surgery and with your pcp  It was a pleasure caring for you today in the emergency department.  Please return to the emergency department for any worsening or worrisome symptoms.

## 2023-06-03 NOTE — ED Provider Notes (Signed)
 [facility] Provider Note  CSN: [id number] Arrival date & time: 06/03/23 1151  Chief Complaint(s) Leg Swelling  HPI Joseph Ortega is a 69 y.o. male with past medical history as below, significant for hypertension, DM, depression, anxiety, TBI who presents to the ED with complaint of left leg swelling  Patient was seen by neurology office earlier today, Dr. Pearlean Brownie was instructed to come to the ER for leg swelling, possible DVT.  Patient reports left leg swelling greater than right leg swelling.  Ports intermittent leg swelling over the past few months seems worsened over the past few days.  Has no significant pain to his lower extremities.  He uses a wheeled walker at all times.  Denies any falls or injuries.  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  Past Medical History Past Medical History:  Diagnosis Date   Anxiety    Arthritis    Depression    Diabetes mellitus without complication (HCC)    Headache    Hypertension    Hypertensive crisis 02/22/2023   Hypothyroidism    Sleep apnea    wears CPAP   Stroke (HCC)    1 stroke 2/24, and 2 strokes on 03/09/21   Thyroid disease    Patient Active Problem List   Diagnosis Date Noted   Edema of left lower leg 05/31/2023   Tinea pedis 03/11/2023   History of peripheral edema 03/11/2023   Current mild episode of major depressive disorder (HCC) 03/04/2023   TBI (traumatic brain injury) (HCC) 02/26/2023   Cervical stenosis of spine 08/15/2022   Hyperlipidemia 05/07/2022   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 Authorizing Provider  acetaminophen (TYLENOL) 325 MG tablet Take 1-2 tablets (325-650 mg total) by mouth every 4 (four) hours as needed for mild pain (pain score 1-3). 03/11/23   Love, Evlyn Kanner, PA-C  ascorbic acid (VITAMIN C) 500 MG tablet Take 1 tablet (500 mg total) by mouth daily with supper. 03/11/23   Love, Evlyn Kanner, PA-C  aspirin EC 81 MG tablet Take 1 tablet (81 mg total) by mouth daily. Swallow whole. 06/03/23   Micki Riley, MD  busPIRone (BUSPAR) 10 MG tablet Take 10 mg by mouth 3 (three) times daily. 02/06/23   [provider]  carvedilol (COREG) 12.5 MG tablet Take 1 tablet (12.5 mg total) by mouth 2 (two) times daily with a meal. 03/11/23   Love, Evlyn Kanner, PA-C  clotrimazole (LOTRIMIN) 1 % cream Apply topically 2 (two) times daily. 03/11/23   Love, Evlyn Kanner, PA-C  cyclobenzaprine (FLEXERIL) 10 MG tablet Take 1 tablet (10 mg total) by mouth 2 (two) times daily as needed for muscle spasms. 04/10/23   Roxy Horseman, PA-C  diclofenac Sodium (VOLTAREN) 1 % GEL Apply 2 g topically 4 (four) times daily. 03/11/23   Love, Evlyn Kanner, PA-C  diphenhydrAMINE (BENADRYL) 25 mg capsule Take 25 mg by mouth every 6 (six) hours as needed for allergies.    [provider]  docusate sodium (COLACE) 100 MG capsule Take 1 capsule (100 mg total) by mouth 2 (two) times daily. 03/11/23   Love, Evlyn Kanner, PA-C  Dulaglutide (TRULICITY) 3 MG/0.5ML SOPN Inject 3 mg into the skin once a week. Patient taking differently: Inject 3 mg into the skin once a week. Inject on Sunday 05/23/22   Angiulli, Mcarthur Rossetti, PA-C  DULoxetine (CYMBALTA) 30 MG capsule Take 30 mg by mouth 2 (two) times daily.    [provider]  gabapentin (NEURONTIN) 300 MG capsule Take 1 capsule (300 mg total) by mouth 3 (three) times daily. 10/10/22   Ranelle Oyster, MD   hydrochlorothiazide (HYDRODIURIL) 12.5 MG tablet Take 1 tablet (12.5 mg total) by mouth 2 (two) times daily. 03/11/23   Love, Evlyn Kanner, PA-C  insulin aspart (NOVOLOG) 100 UNIT/ML injection Inject 0-15 Units into the skin 3 (three) times daily before meals. Maximum of 20 units    [provider]  insulin detemir (LEVEMIR) 100 UNIT/ML FlexPen Inject 23 Units into the skin at bedtime. 03/11/23   Love, Evlyn Kanner, PA-C  levothyroxine (SYNTHROID) 125 MCG tablet Take 1 tablet (125 mcg total) by mouth daily at 6 (six) AM. 05/23/22   Angiulli, Mcarthur Rossetti, PA-C  losartan (COZAAR) 25 MG tablet Take 1 tablet (25 mg total) by mouth at bedtime. 04/26/23   Tiffany Kocher, DO  Multiple Vitamins-Minerals (ALIVE MENS GUMMY MULTIVITAMINS) CHEW Chew 3 tablets by mouth daily.    [provider]  potassium chloride SA (KLOR-CON M20) 20 MEQ tablet Take 1 tablet (20 mEq total) by mouth daily. 03/11/23   Love, Evlyn Kanner, PA-C  rosuvastatin (CRESTOR) 40 MG tablet Take 1 tablet (40 mg total) by mouth daily. 03/11/23   Love, Evlyn Kanner, PA-C  topiramate (TOPAMAX) 50 MG tablet Take 1 tablet (50 mg total) by mouth daily. 05/23/22   Angiulli, Mcarthur Rossetti, PA-C                                                                                                                                    Past Surgical History Past Surgical History:  Procedure Laterality Date   ANTERIOR CERVICAL DECOMP/DISCECTOMY FUSION N/A 11/23/2022   Procedure: Anterior Cervical Decompression Fusion  Cervical four-five;  Surgeon: Lisbeth Renshaw, MD;  Location: [facility] OR;  Service: Neurosurgery;  Laterality: N/A;   GROIN DEBRIDEMENT Left    ingrown hair, possibly abscesses, surgery to treat infection   HERNIA REPAIR Left    inguinal   IR ANGIO INTRA EXTRACRAN SEL COM CAROTID INNOMINATE BILAT MOD SED  03/09/2021   IR ANGIO VERTEBRAL SEL VERTEBRAL BILAT MOD SED  03/09/2021   IR US GUIDE VASC ACCESS RIGHT  03/09/2021   LOOP RECORDER INSERTION N/A  05/07/2022   Procedure: LOOP RECORDER INSERTION;  Surgeon: Lanier Prude, MD;  Location: [facility] INVASIVE CV LAB;  Service: Cardiovascular;  Laterality: N/A;   Family History Family History  Problem Relation Age  of Onset   Diabetes Father     Social History Social History   Tobacco Use   Smoking status: Never   Smokeless tobacco: Never  Vaping Use   Vaping status: Never Used  Substance Use Topics   Alcohol use: No   Drug use: No   Allergies Patient has no known allergies.  Review of Systems A thorough review of systems was obtained and all systems are negative except as noted in the HPI and PMH.   Physical Exam Vital Signs  I have reviewed the triage vital signs BP (!) 190/88 (BP Location: Right Arm)   Pulse 67   Temp 98.2 F (36.8 C) (Oral)   Resp 18   Ht 6\' 1"  (1.854 m)   Wt 107 kg   SpO2 94%   BMI 31.14 kg/m  Physical Exam Vitals and nursing note reviewed.  Constitutional:      General: He is not in acute distress.    Appearance: Normal appearance. He is well-developed. He is not ill-appearing.  HENT:     Head: Normocephalic and atraumatic.     Right Ear: External ear normal.     Left Ear: External ear normal.     Nose: Nose normal.     Mouth/Throat:     Mouth: Mucous membranes are moist.  Eyes:     General: No scleral icterus.       Right eye: No discharge.        Left eye: No discharge.  Cardiovascular:     Rate and Rhythm: Normal rate.  Pulmonary:     Effort: Pulmonary effort is normal. No respiratory distress.     Breath sounds: No stridor.  Abdominal:     General: Abdomen is flat. There is no distension.     Tenderness: There is no guarding.  Musculoskeletal:        General: Swelling present. No tenderness.     Cervical back: No rigidity.     Right lower leg: Edema present.     Left lower leg: Edema present.     Comments: Swelling noted bilateral lower extremities, left greater than right.  Left foot feels cool compared to right foot.   Difficult to palpate left DP or PT pulse.  Capillary refill is delayed b/l feet  Skin:    General: Skin is warm and dry.     Coloration: Skin is not cyanotic, jaundiced or pale.  Neurological:     Mental Status: He is alert and oriented to person, place, and time.     GCS: GCS eye subscore is 4. GCS verbal subscore is 5. GCS motor subscore is 6.  Psychiatric:        Speech: Speech normal.        Behavior: Behavior normal. Behavior is cooperative.     ED Results and Treatments Labs (all labs ordered are listed, but only abnormal results are displayed) Labs Reviewed - No data to display  Radiology CUP PACEART REMOTE DEVICE CHECK Result Date: 06/03/2023 ILR summary report received. Battery status OK. Normal device function. No new symptom, tachy, brady, or pause episodes. No new AF episodes. Monthly summary reports and ROV/PRN - CS, CVRS  VAS [place] ABI WITH/WO TBI Result Date: 06/03/2023  LOWER EXTREMITY DOPPLER STUDY Patient Name:  Joseph Ortega  Date of Exam:   06/03/2023 Medical Rec #: [id number]         Accession #:    [id number] Date of Birth: March 07, 1956         Patient Gender: M Patient Age:   63 years Exam Location:  [facility] Procedure:      VAS [place] ABI WITH/WO TBI Referring Phys: Tanda Rockers --------------------------------------------------------------------------------  Indications: Cold foot High Risk Factors: Hypertension, hyperlipidemia, Diabetes, prior CVA.  Comparison Study: Reduced Left TBI since previous exam 02/23/21 Performing Technologist: Shona Simpson  Examination Guidelines: A complete evaluation includes at minimum, Doppler waveform signals and systolic blood pressure reading at the level of bilateral brachial, anterior tibial, and posterior tibial arteries, when vessel segments are accessible. Bilateral testing is considered an integral part of a  complete examination. Photoelectric Plethysmograph (PPG) waveforms and toe systolic pressure readings are included as required and additional duplex testing as needed. Limited examinations for reoccurring indications may be performed as noted.  ABI Findings: +---------+------------------+-----+---------+--------+ Right    Rt Pressure (mmHg)IndexWaveform Comment  +---------+------------------+-----+---------+--------+ Brachial 185                    triphasic         +---------+------------------+-----+---------+--------+ PTA      255               1.34 triphasic         +---------+------------------+-----+---------+--------+ DP       255               1.34 triphasic         +---------+------------------+-----+---------+--------+ Great Toe187               0.98 Normal            +---------+------------------+-----+---------+--------+ +---------+------------------+-----+---------+-------+ Left     Lt Pressure (mmHg)IndexWaveform Comment +---------+------------------+-----+---------+-------+ Brachial 190                    biphasic         +---------+------------------+-----+---------+-------+ PTA      255               1.34 triphasic        +---------+------------------+-----+---------+-------+ DP       255               1.34 triphasic        +---------+------------------+-----+---------+-------+ Great Toe102               0.54 Abnormal         +---------+------------------+-----+---------+-------+ +-------+-----------+-----------+------------+------------+ ABI/TBIToday's ABIToday's TBIPrevious ABIPrevious TBI +-------+-----------+-----------+------------+------------+ Right  [place]         0.98       [place]          0.95         +-------+-----------+-----------+------------+------------+ Left   [place]         0.54       [place]          1.07         +-------+-----------+-----------+------------+------------+  Bilateral ABIs appear essentially unchanged  compared to prior study on 02/23/21. Left TBIs appear  decreased compared to prior study on 02/23/21.  Summary: Right: Resting right ankle-brachial index indicates noncompressible right lower extremity arteries. The right toe-brachial index is normal. Left: Resting left ankle-brachial index indicates noncompressible left lower extremity arteries. The left toe-brachial index is abnormal. *See table(s) above for measurements and observations.     Preliminary    VAS [place] LOWER EXTREMITY VENOUS (DVT) (7a-7p) Result Date: 06/03/2023  Lower Venous DVT Study Patient Name:  Joseph Ortega  Date of Exam:   06/03/2023 Medical Rec #: [id number]         Accession #:    [id number] Date of Birth: Sep 07, 1955         Patient Gender: M Patient Age:   89 years Exam Location:  [facility] Procedure:      VAS [place] LOWER EXTREMITY VENOUS (DVT) Referring Phys: CHRISTIAN PROSPERI --------------------------------------------------------------------------------  Indications: Pain, and Swelling.  Comparison Study: Previous study on 2.15.2024. Performing Technologist: Fernande Bras  Examination Guidelines: A complete evaluation includes B-mode imaging, spectral Doppler, color Doppler, and power Doppler as needed of all accessible portions of each vessel. Bilateral testing is considered an integral part of a complete examination. Limited examinations for reoccurring indications may be performed as noted. The reflux portion of the exam is performed with the patient in reverse Trendelenburg.  +-----+---------------+---------+-----------+----------+--------------+ RIGHTCompressibilityPhasicitySpontaneityPropertiesThrombus Aging +-----+---------------+---------+-----------+----------+--------------+ CFV  Full           Yes      Yes                                 +-----+---------------+---------+-----------+----------+--------------+ SFJ  Full           Yes      Yes                                  +-----+---------------+---------+-----------+----------+--------------+   +---------+---------------+---------+-----------+----------+--------------+ LEFT     CompressibilityPhasicitySpontaneityPropertiesThrombus Aging +---------+---------------+---------+-----------+----------+--------------+ CFV      Full           Yes      Yes                                 +---------+---------------+---------+-----------+----------+--------------+ SFJ      Full           Yes      Yes                                 +---------+---------------+---------+-----------+----------+--------------+ FV Prox  Full                                                        +---------+---------------+---------+-----------+----------+--------------+ FV Mid   Full                                                        +---------+---------------+---------+-----------+----------+--------------+ FV DistalFull                                                        +---------+---------------+---------+-----------+----------+--------------+  PFV      Full                                                        +---------+---------------+---------+-----------+----------+--------------+ POP      Full           Yes      Yes                                 +---------+---------------+---------+-----------+----------+--------------+ PTV      Full                                                        +---------+---------------+---------+-----------+----------+--------------+ PERO     Full                                                        +---------+---------------+---------+-----------+----------+--------------+ Calcified vessel wall noted in left SSV.    Summary: RIGHT: - No evidence of common femoral vein obstruction.   LEFT: - There is no evidence of deep vein thrombosis in the lower extremity.  - No cystic structure found in the popliteal fossa.  *See table(s) above for measurements  and observations. Electronically signed by Gerarda Fraction on 06/03/2023 at 5:32:56 PM.    Final     Pertinent labs & imaging results that were available during my care of the patient were reviewed by me and considered in my medical decision making (see MDM for details).  Medications Ordered in ED Medications - No data to display                                                                                                                                   Procedures Procedures  (including critical care time)  Medical Decision Making / ED Course    Medical Decision Making:    Serge Main is a 68 y.o. male with past medical history as below, significant for hypertension, DM, depression, anxiety, TBI who presents to the ED with complaint of left leg swelling. The complaint involves an extensive differential diagnosis and also carries with it a high risk of complications and morbidity.  Serious etiology was considered. Ddx includes but is not limited to: DVT, lymphedema, CHF, stasis dermatitis, arterial abnormality, vascular malady, etc.  Complete initial physical exam performed, notably the patient was in no distress, sitting comfortably on  stretcher.    Reviewed and confirmed nursing documentation for past medical history, family history, social history.  Vital signs reviewed.     Clinical Course as of 06/03/23 1949  Mon Jun 03, 2023  1654 Difficult to palpate pulse left foot, foot is cool to the touch from midcalf down. His contralateral extremity is also cool to the touch but not to the extent of the LLE. He has good sensation to both feet, no significant pain reported.  [SG]  1726 He has DP pulse visualized on bedside ultrasound. The foot remains mottled, cool to the touch. No sig pain or sensation loss, will d/w vascular  [SG]  1740 Spoke w/ Dr Sherral Hammers, recommend ABI and f/u in the office. No need for angio or AC given +signal in the foot and no sig pain. Nothing that would  require surgical intervention from vascular standpoint  [SG]  1856 ABI unchanged [SG]    Clinical Course User Index [SG] Sloan Leiter, DO    Brief summary: Patient sent here for DVT rule out, left lower extremity swelling, worse.  Duplex was negative.  He has a temperature change to his left foot, reduced DP pulse.  ABI was stable.  He has visible DP pulse on bedside ultrasound. No claudication.  Discussed with Dr. Karin Lieu vascular surgery as noted above.  Will plan for discharge, follow-up with vascular in the office, follow-up PCP. Return if worse.   The patient improved significantly and was discharged in stable condition. Detailed discussions were had with the patient/guardian regarding current findings, and need for close f/u with PCP or on call doctor. The patient/guardian has been instructed to return immediately if the symptoms worsen in any way for re-evaluation. Patient/guardian verbalized understanding and is in agreement with current care plan. All questions answered prior to discharge.                  Additional history obtained: -Additional history obtained from na -External records from outside source obtained and reviewed including: Chart review including previous notes, labs, imaging, consultation notes including  Home meds Recent neurology office note Prior imaging    Lab Tests: na  EKG   EKG Interpretation Date/Time:    Ventricular Rate:    PR Interval:    QRS Duration:    QT Interval:    QTC Calculation:   R Axis:      Text Interpretation:           Imaging Studies ordered: I ordered imaging studies including ABI/DVT study I independently visualized the following imaging with scope of interpretation limited to determining acute life threatening conditions related to emergency care; findings noted above I independently visualized and interpreted imaging. I agree with the radiologist interpretation   Medicines ordered and prescription  drug management: No orders of the defined types were placed in this encounter.   -I have reviewed the patients home medicines and have made adjustments as needed   Consultations Obtained: I requested consultation with the vascular,  and discussed lab and imaging findings as well as pertinent plan - they recommend: get abi   Cardiac Monitoring: Continuous pulse oximetry interpreted by myself, 97% on RA.    Social Determinants of Health:  Diagnosis or treatment significantly limited by social determinants of health: na   Reevaluation: After the interventions noted above, I reevaluated the patient and found that they have stayed the same  Co morbidities that complicate the patient evaluation  Past Medical History:  Diagnosis Date  Anxiety    Arthritis    Depression    Diabetes mellitus without complication (HCC)    Headache    Hypertension    Hypertensive crisis 02/22/2023   Hypothyroidism    Sleep apnea    wears CPAP   Stroke [facility])    1 stroke 2/24, and 2 strokes on 03/09/21   Thyroid disease       Dispostion: Disposition decision including need for hospitalization was considered, and patient discharged from emergency department.    Final Clinical Impression(s) / ED Diagnoses Final diagnoses:  Leg swelling  Peripheral edema        Sloan Leiter, DO 06/03/23 1949

## 2023-06-04 ENCOUNTER — Ambulatory Visit: Payer: Medicare HMO | Admitting: Pharmacist

## 2023-06-04 ENCOUNTER — Telehealth: Payer: Self-pay | Admitting: Neurology

## 2023-06-04 ENCOUNTER — Ambulatory Visit: Payer: Medicare HMO | Admitting: Podiatry

## 2023-06-04 ENCOUNTER — Ambulatory Visit: Payer: Self-pay | Admitting: Student

## 2023-06-04 LAB — VAS US ABI WITH/WO TBI

## 2023-06-04 NOTE — Telephone Encounter (Addendum)
 Referral for physical medicine and rehabilitation sent through [facility] to [facility]. Phone: [phone], Fax: [phone]

## 2023-06-06 ENCOUNTER — Telehealth: Payer: Self-pay | Admitting: Pharmacist

## 2023-06-06 NOTE — Telephone Encounter (Signed)
 Attempted to contact patient for follow-up of Diabetes    Left HIPAA compliant voice mail requesting call back to direct phone: [phone] or direct line to front office and reschedule.   Total time with patient call and documentation of interaction: 5 minutes.

## 2023-06-09 ENCOUNTER — Encounter: Payer: Self-pay | Admitting: Cardiology

## 2023-06-11 ENCOUNTER — Ambulatory Visit: Admitting: Podiatry

## 2023-06-12 ENCOUNTER — Telehealth: Payer: Self-pay | Admitting: Pharmacist

## 2023-06-12 NOTE — Telephone Encounter (Addendum)
 Attempted to contact patient for follow-up of missed appointment.   Left HIPAA compliant voice mail requesting call back to direct phone: [phone] or [phone]  Total time with patient call and documentation of interaction: 4 minutes.  Follow-up phone call planned: none - following two attempts to reach.      ----- Message from Madelon Lips sent at 06/07/2023  4:21 PM EDT ----- Regarding: FW: Diabetes management/CGM Scheduled? ----- Message ----- From: Kathrin Ruddy, RPH-CPP Sent: 06/03/2023  12:00 AM EDT To: Kathrin Ruddy, RPH-CPP Subject: FW: Diabetes management/CGM                    3/18 visit with Claudean Severance and with Rx ----- Message ----- From: Tiffany Kocher, DO Sent: 05/01/2023   4:17 PM EST To: Kathrin Ruddy, RPH-CPP Subject: Diabetes management/CGM                        This patient is being sent for type 2 diabetes management.  A1c 11.5.  Currently on Trulicity and insulin.  Questionable compliance with insulin.  Would benefit from CGM.

## 2023-06-21 ENCOUNTER — Ambulatory Visit (HOSPITAL_COMMUNITY)

## 2023-06-24 ENCOUNTER — Telehealth: Payer: Self-pay | Admitting: Pharmacist

## 2023-06-24 NOTE — Telephone Encounter (Signed)
 Attempted to contact patient for follow-up of Diabetes control and potential visit at [facility] for improvement.   Left HIPAA compliant voice mail requesting call back to direct phone: [phone]  Total time with patient call and documentation of interaction: 4 minutes.

## 2023-06-25 ENCOUNTER — Ambulatory Visit: Admitting: Podiatry

## 2023-06-27 ENCOUNTER — Ambulatory Visit: Admitting: Podiatry

## 2023-07-08 ENCOUNTER — Ambulatory Visit (INDEPENDENT_AMBULATORY_CARE_PROVIDER_SITE_OTHER): Payer: Self-pay

## 2023-07-08 DIAGNOSIS — I639 Cerebral infarction, unspecified: Secondary | ICD-10-CM

## 2023-07-09 LAB — CUP PACEART REMOTE DEVICE CHECK
Date Time Interrogation Session: 20250421080306
Implantable Pulse Generator Implant Date: 20240219
Pulse Gen Model: 5000
Pulse Gen Serial Number: 511022381

## 2023-07-14 ENCOUNTER — Encounter: Payer: Self-pay | Admitting: Cardiology

## 2023-07-17 NOTE — Progress Notes (Signed)
 [address]lin [address] [employer]

## 2023-07-30 ENCOUNTER — Ambulatory Visit: Admitting: Podiatry

## 2023-08-23 NOTE — Progress Notes (Signed)
 [address]lin [address] [employer]

## 2023-08-23 NOTE — Addendum Note (Signed)
 Addended by: Edra Govern D on: 08/23/2023 03:13 PM   Modules accepted: Orders

## 2023-09-06 ENCOUNTER — Other Ambulatory Visit: Payer: Self-pay | Admitting: Physical Medicine and Rehabilitation

## 2023-09-12 ENCOUNTER — Emergency Department (HOSPITAL_COMMUNITY)

## 2023-09-12 ENCOUNTER — Other Ambulatory Visit: Payer: Self-pay

## 2023-09-12 ENCOUNTER — Encounter (HOSPITAL_COMMUNITY): Payer: Self-pay

## 2023-09-12 ENCOUNTER — Ambulatory Visit

## 2023-09-12 ENCOUNTER — Inpatient Hospital Stay (HOSPITAL_COMMUNITY)
Admission: EM | Admit: 2023-09-12 | Discharge: 2023-09-19 | DRG: 286 | Disposition: A | Discharge status: Home Health | Attending: Family Medicine | Admitting: Family Medicine

## 2023-09-12 DIAGNOSIS — I513 Intracardiac thrombosis, not elsewhere classified: Secondary | ICD-10-CM

## 2023-09-12 DIAGNOSIS — I2722 Pulmonary hypertension due to left heart disease: Secondary | ICD-10-CM | POA: Diagnosis present

## 2023-09-12 DIAGNOSIS — F32A Depression, unspecified: Secondary | ICD-10-CM | POA: Diagnosis present

## 2023-09-12 DIAGNOSIS — I440 Atrioventricular block, first degree: Secondary | ICD-10-CM | POA: Diagnosis present

## 2023-09-12 DIAGNOSIS — Z79890 Hormone replacement therapy (postmenopausal): Secondary | ICD-10-CM

## 2023-09-12 DIAGNOSIS — Z794 Long term (current) use of insulin: Secondary | ICD-10-CM

## 2023-09-12 DIAGNOSIS — H5462 Unqualified visual loss, left eye, normal vision right eye: Secondary | ICD-10-CM | POA: Diagnosis present

## 2023-09-12 DIAGNOSIS — I5021 Acute systolic (congestive) heart failure: Secondary | ICD-10-CM | POA: Diagnosis present

## 2023-09-12 DIAGNOSIS — Z7985 Long-term (current) use of injectable non-insulin antidiabetic drugs: Secondary | ICD-10-CM

## 2023-09-12 DIAGNOSIS — G4733 Obstructive sleep apnea (adult) (pediatric): Secondary | ICD-10-CM | POA: Diagnosis present

## 2023-09-12 DIAGNOSIS — I422 Other hypertrophic cardiomyopathy: Secondary | ICD-10-CM | POA: Diagnosis present

## 2023-09-12 DIAGNOSIS — R931 Abnormal findings on diagnostic imaging of heart and coronary circulation: Secondary | ICD-10-CM

## 2023-09-12 DIAGNOSIS — I2510 Atherosclerotic heart disease of native coronary artery without angina pectoris: Secondary | ICD-10-CM | POA: Diagnosis present

## 2023-09-12 DIAGNOSIS — I69354 Hemiplegia and hemiparesis following cerebral infarction affecting left non-dominant side: Secondary | ICD-10-CM

## 2023-09-12 DIAGNOSIS — Z7982 Long term (current) use of aspirin: Secondary | ICD-10-CM

## 2023-09-12 DIAGNOSIS — R600 Localized edema: Secondary | ICD-10-CM | POA: Diagnosis not present

## 2023-09-12 DIAGNOSIS — K219 Gastro-esophageal reflux disease without esophagitis: Secondary | ICD-10-CM | POA: Diagnosis present

## 2023-09-12 DIAGNOSIS — R9431 Abnormal electrocardiogram [ECG] [EKG]: Secondary | ICD-10-CM | POA: Insufficient documentation

## 2023-09-12 DIAGNOSIS — I110 Hypertensive heart disease with heart failure: Secondary | ICD-10-CM | POA: Diagnosis not present

## 2023-09-12 DIAGNOSIS — I4510 Unspecified right bundle-branch block: Secondary | ICD-10-CM | POA: Diagnosis present

## 2023-09-12 DIAGNOSIS — Z789 Other specified health status: Secondary | ICD-10-CM

## 2023-09-12 DIAGNOSIS — E785 Hyperlipidemia, unspecified: Secondary | ICD-10-CM | POA: Diagnosis present

## 2023-09-12 DIAGNOSIS — E1165 Type 2 diabetes mellitus with hyperglycemia: Secondary | ICD-10-CM | POA: Diagnosis present

## 2023-09-12 DIAGNOSIS — I3139 Other pericardial effusion (noninflammatory): Secondary | ICD-10-CM | POA: Diagnosis present

## 2023-09-12 DIAGNOSIS — E039 Hypothyroidism, unspecified: Secondary | ICD-10-CM | POA: Diagnosis present

## 2023-09-12 DIAGNOSIS — I509 Heart failure, unspecified: Principal | ICD-10-CM

## 2023-09-12 DIAGNOSIS — Z79899 Other long term (current) drug therapy: Secondary | ICD-10-CM

## 2023-09-12 DIAGNOSIS — I5022 Chronic systolic (congestive) heart failure: Secondary | ICD-10-CM | POA: Diagnosis present

## 2023-09-12 DIAGNOSIS — Z833 Family history of diabetes mellitus: Secondary | ICD-10-CM

## 2023-09-12 DIAGNOSIS — N179 Acute kidney failure, unspecified: Secondary | ICD-10-CM

## 2023-09-12 DIAGNOSIS — Q2112 Patent foramen ovale: Secondary | ICD-10-CM

## 2023-09-12 DIAGNOSIS — Z91148 Patient's other noncompliance with medication regimen for other reason: Secondary | ICD-10-CM

## 2023-09-12 DIAGNOSIS — I878 Other specified disorders of veins: Secondary | ICD-10-CM | POA: Diagnosis present

## 2023-09-12 DIAGNOSIS — F419 Anxiety disorder, unspecified: Secondary | ICD-10-CM | POA: Diagnosis present

## 2023-09-12 DIAGNOSIS — I5020 Unspecified systolic (congestive) heart failure: Secondary | ICD-10-CM | POA: Diagnosis present

## 2023-09-12 DIAGNOSIS — I872 Venous insufficiency (chronic) (peripheral): Secondary | ICD-10-CM | POA: Diagnosis present

## 2023-09-12 DIAGNOSIS — I890 Lymphedema, not elsewhere classified: Secondary | ICD-10-CM | POA: Diagnosis present

## 2023-09-12 LAB — CBC WITH DIFFERENTIAL/PLATELET
Abs Immature Granulocytes: 0.03 10*3/uL (ref 0.00–0.07)
Basophils Absolute: 0 10*3/uL (ref 0.0–0.1)
Basophils Relative: 1 %
Eosinophils Absolute: 0.1 10*3/uL (ref 0.0–0.5)
Eosinophils Relative: 2 %
HCT: 38.7 % — ABNORMAL LOW (ref 39.0–52.0)
Hemoglobin: 12.9 g/dL — ABNORMAL LOW (ref 13.0–17.0)
Immature Granulocytes: 1 %
Lymphocytes Relative: 15 %
Lymphs Abs: 0.7 10*3/uL (ref 0.7–4.0)
MCH: 32.7 pg (ref 26.0–34.0)
MCHC: 33.3 g/dL (ref 30.0–36.0)
MCV: 98.2 fL (ref 80.0–100.0)
Monocytes Absolute: 0.3 10*3/uL (ref 0.1–1.0)
Monocytes Relative: 7 %
Neutro Abs: 3.7 10*3/uL (ref 1.7–7.7)
Neutrophils Relative %: 74 %
Platelets: 141 10*3/uL — ABNORMAL LOW (ref 150–400)
RBC: 3.94 MIL/uL — ABNORMAL LOW (ref 4.22–5.81)
RDW: 14.8 % (ref 11.5–15.5)
WBC: 5 10*3/uL (ref 4.0–10.5)
nRBC: 0 % (ref 0.0–0.2)

## 2023-09-12 LAB — COMPREHENSIVE METABOLIC PANEL WITH GFR
ALT: 18 U/L (ref 0–44)
AST: 27 U/L (ref 15–41)
Albumin: 3.5 g/dL (ref 3.5–5.0)
Alkaline Phosphatase: 123 U/L (ref 38–126)
Anion gap: 11 (ref 5–15)
BUN: 13 mg/dL (ref 8–23)
CO2: 25 mmol/L (ref 22–32)
Calcium: 8.9 mg/dL (ref 8.9–10.3)
Chloride: 100 mmol/L (ref 98–111)
Creatinine, Ser: 0.73 mg/dL (ref 0.61–1.24)
GFR, Estimated: >60 mL/min (ref >60)
Glucose, Bld: 315 mg/dL — ABNORMAL HIGH (ref 70–99)
Potassium: 4.1 mmol/L (ref 3.5–5.1)
Sodium: 136 mmol/L (ref 135–145)
Total Bilirubin: 1.3 mg/dL — ABNORMAL HIGH (ref 0.0–1.2)
Total Protein: 7.1 g/dL (ref 6.5–8.1)

## 2023-09-12 LAB — I-STAT VENOUS BLOOD GAS, ED
Acid-Base Excess: 6 mmol/L — ABNORMAL HIGH (ref 0.0–2.0)
Bicarbonate: 31.1 mmol/L — ABNORMAL HIGH (ref 20.0–28.0)
Calcium, Ion: 1.09 mmol/L — ABNORMAL LOW (ref 1.15–1.40)
HCT: 39 % (ref 39.0–52.0)
Hemoglobin: 13.3 g/dL (ref 13.0–17.0)
O2 Saturation: 60 %
Potassium: 4 mmol/L (ref 3.5–5.1)
Sodium: 138 mmol/L (ref 135–145)
TCO2: 32 mmol/L (ref 22–32)
pCO2, Ven: 43.5 mmHg — ABNORMAL LOW (ref 44–60)
pH, Ven: 7.462 — ABNORMAL HIGH (ref 7.25–7.43)
pO2, Ven: 30 mmHg — CL (ref 32–45)

## 2023-09-12 NOTE — ED Notes (Signed)
 Patient transported to X-ray

## 2023-09-12 NOTE — ED Provider Triage Note (Signed)
 Emergency Medicine Provider Triage Evaluation Note  Joseph Ortega , a 68 y.o. male  was evaluated in triage.  Pt complains of leg swelling and soreness in his left leg..  Patient states he has history of leg swelling ongoing for a while now.  In March he was sent to the ED to haveDoppler studies to make sure he did not have a blood clot.  That was negative.  Patient states recently started having swelling again.  He also has noticed his blood sugars have been elevated.  Patient states he did take his diuretics today.  He also has been taking his [place] medications.  No fevers or chills no shortness of breath  Review of Systems  Positive: Leg swelling Negative: fever  Physical Exam  BP (!) 170/78   Pulse (!) 58   Temp 97.7 F (36.5 C) (Oral)   Resp 18   SpO2 98%  Gen:   Awake, no distress   Resp:  Normal effort  MSK:   Moves extremities without difficulty , swelling noted bilateral lower extremities, dirt, debris on feet, ttp left lower extremity, no lymphangitic streaking Other:    Medical Decision Making  Medically screening exam initiated at 10:30 PM.  Appropriate orders placed.  Joseph Ortega was informed that the remainder of the evaluation will be completed by another provider, this initial triage assessment does not replace that evaluation, and the importance of remaining in the ED until their evaluation is complete.     Joseph Simmonds, MD 09/12/23 2232

## 2023-09-12 NOTE — ED Provider Notes (Signed)
 [facility] Emergency Department Provider Note MRN:  [id number]  Arrival date & time: 09/13/23     Chief Complaint   Leg Pain   History of Present Illness   Joseph Ortega is a 68 y.o. year-old male with a history of diabetes, stroke presenting to the ED with chief complaint of leg pain.  Leg pain and swelling progressively worsening over the past few months.  Now has developed a wound that is leaking watery fluid.  The right leg is also swollen but not as much.  Also noticed swelling in his arms.  Has not really noticed shortness of breath with laying flat because he never lays flat, sleeps on several pillows.  Has had some intermittent pressure-like chest pain as well.  Review of Systems  A thorough review of systems was obtained and all systems are negative except as noted in the HPI and PMH.   Patient's Health History    Past Medical History:  Diagnosis Date   Anxiety    Arthritis    Depression    Diabetes mellitus without complication (HCC)    Headache    Hypertension    Hypertensive crisis 02/22/2023   Hypothyroidism    Sleep apnea    wears CPAP   Stroke (HCC)    1 stroke 2/24, and 2 strokes on 03/09/21   Thyroid disease     Past Surgical History:  Procedure Laterality Date   ANTERIOR CERVICAL DECOMP/DISCECTOMY FUSION N/A 11/23/2022   Procedure: Anterior Cervical Decompression Fusion  Cervical four-five;  Surgeon: Lanis Pupa, MD;  Location: [facility] OR;  Service: Neurosurgery;  Laterality: N/A;   GROIN DEBRIDEMENT Left    ingrown hair, possibly abscesses, surgery to treat infection   HERNIA REPAIR Left    inguinal   IR ANGIO INTRA EXTRACRAN SEL COM CAROTID INNOMINATE BILAT MOD SED  03/09/2021   IR ANGIO VERTEBRAL SEL VERTEBRAL BILAT MOD SED  03/09/2021   IR US  GUIDE VASC ACCESS RIGHT  03/09/2021   LOOP RECORDER INSERTION N/A 05/07/2022   Procedure: LOOP RECORDER INSERTION;  Surgeon: Cindie Ole DASEN, MD;  Location: [facility] INVASIVE CV LAB;   Service: Cardiovascular;  Laterality: N/A;    Family History  Problem Relation Age of Onset   Diabetes Father     Social History   Socioeconomic History   Marital status: Married    Spouse name: Not on file   Number of children: 3   Years of education: Not on file   Highest education level: Not on file  Occupational History   Not on file  Tobacco Use   Smoking status: Never   Smokeless tobacco: Never  Vaping Use   Vaping status: Never Used  Substance and Sexual Activity   Alcohol use: No   Drug use: No   Sexual activity: Not on file  Other Topics Concern   Not on file  Social History Narrative   Not on file   Social Drivers of Health   Financial Resource Strain: Not on file  Food Insecurity: No Food Insecurity (02/22/2023)   Hunger Vital Sign    Worried About Running Out of Food in the Last Year: Never true    Ran Out of Food in the Last Year: Never true  Transportation Needs: No Transportation Needs (02/22/2023)   PRAPARE - Administrator, Civil Service (Medical): No    Lack of Transportation (Non-Medical): No  Physical Activity: Not on file  Stress: Not on file  Social Connections: Unknown (  08/01/2021)   Received from [facility]   Social Network    Social Network: Not on file  Intimate Partner Violence: Not At Risk (02/22/2023)   Humiliation, Afraid, Rape, and Kick questionnaire    Fear of Current or Ex-Partner: No    Emotionally Abused: No    Physically Abused: No    Sexually Abused: No     Physical Exam   Vitals:   09/13/23 0059 09/13/23 0100  BP:    Pulse: (!) 59 (!) 56  Resp: 17 15  Temp:    SpO2: 100% 98%    CONSTITUTIONAL: Well-appearing, NAD NEURO/PSYCH:  Alert and oriented x 3, no focal deficits EYES:  eyes equal and reactive ENT/NECK:  no LAD, no JVD CARDIO: Regular rate, well-perfused, normal S1 and S2 PULM:  CTAB no wheezing or rhonchi GI/GU:  non-distended, non-tender MSK/SPINE:  No gross deformities, pitting edema to  bilateral lower extremities as well as left upper extremity SKIN:  no rash, atraumatic   *Additional and/or pertinent findings included in MDM below  Diagnostic and Interventional Summary    EKG Interpretation Date/Time:    Ventricular Rate:    PR Interval:    QRS Duration:    QT Interval:    QTC Calculation:   R Axis:      Text Interpretation:         Labs Reviewed  COMPREHENSIVE METABOLIC PANEL WITH GFR - Abnormal; Notable for the following components:      Result Value   Glucose, Bld 315 (*)    Total Bilirubin 1.3 (*)    All other components within normal limits  CBC WITH DIFFERENTIAL/PLATELET - Abnormal; Notable for the following components:   RBC 3.94 (*)    Hemoglobin 12.9 (*)    HCT 38.7 (*)    Platelets 141 (*)    All other components within normal limits  URINALYSIS, W/ REFLEX TO CULTURE (INFECTION SUSPECTED) - Abnormal; Notable for the following components:   Glucose, UA >=500 (*)    Ketones, ur 5 (*)    Protein, ur 30 (*)    All other components within normal limits  BRAIN NATRIURETIC PEPTIDE - Abnormal; Notable for the following components:   B Natriuretic Peptide 816.0 (*)    All other components within normal limits  I-STAT VENOUS BLOOD GAS, ED - Abnormal; Notable for the following components:   pH, Ven 7.462 (*)    pCO2, Ven 43.5 (*)    pO2, Ven 30 (*)    Bicarbonate 31.1 (*)    Acid-Base Excess 6.0 (*)    Calcium , Ion 1.09 (*)    All other components within normal limits  TROPONIN I (HIGH SENSITIVITY) - Abnormal; Notable for the following components:   Troponin I (High Sensitivity) 18 (*)    All other components within normal limits  TROPONIN I (HIGH SENSITIVITY)    DG Chest 2 View  Final Result      Medications  furosemide  (LASIX ) injection 40 mg (40 mg Intravenous Given 09/13/23 0139)     Procedures  /  Critical Care Procedures  ED Course and Medical Decision Making  Initial Impression and Ddx Peripheral edema of unclear cause.  Had  an evaluation for DVT of the left leg months ago and it was negative.  Given the worsening nature of the edema now involving upper extremity, increased concern for CHF versus hepatic or renal impairment.  Awaiting labs.  Past medical/surgical history that increases complexity of ED encounter: Stroke, diabetes  Interpretation of  Diagnostics I personally reviewed the EKG and my interpretation is as follows: Sinus rhythm nonspecific findings similar to prior  No significant blood count or electrolyte disturbance.  Minimally elevated troponin, BNP elevated as well.  Patient Reassessment and Ultimate Disposition/Management     Continued suspicion for CHF, new onset.  Has been taking Lasix  at home without any help.  Accepted for admission by family medicine.  Patient management required discussion with the following services or consulting groups:  Hospitalist Service  Complexity of Problems Addressed Acute illness or injury that poses threat of life of bodily function  Additional Data Reviewed and Analyzed Further history obtained from: Prior labs/imaging results  Additional Factors Impacting ED Encounter Risk Consideration of hospitalization  Joseph HERO. Theadore, MD [facility] Emergency Medicine [facility] [email or web address]  Final Clinical Impressions(s) / ED Diagnoses     ICD-10-CM   1. Congestive heart failure, unspecified HF chronicity, unspecified heart failure type (HCC)  I50.9       ED Discharge Orders     None        Discharge Instructions Discussed with and Provided to Patient:   Discharge Instructions   None      Ortega Joseph HERO, MD 09/13/23 0205

## 2023-09-12 NOTE — ED Triage Notes (Addendum)
 Pt is coming in for bilateral lower leg pain and swelling x 2 months, non-compliant diabetic. Pt lives at home, he can ambulate but it is difficult. NO other complaints at this time and the patient is otherwise pleasant and stable at this time.   Medic vitals   162/84 64hr 98%ra 469bgl 18rr

## 2023-09-13 ENCOUNTER — Encounter (HOSPITAL_COMMUNITY): Payer: Self-pay | Admitting: Family Medicine

## 2023-09-13 ENCOUNTER — Inpatient Hospital Stay (HOSPITAL_COMMUNITY)

## 2023-09-13 ENCOUNTER — Other Ambulatory Visit: Payer: Self-pay

## 2023-09-13 DIAGNOSIS — I5020 Unspecified systolic (congestive) heart failure: Secondary | ICD-10-CM | POA: Diagnosis not present

## 2023-09-13 DIAGNOSIS — Q2112 Patent foramen ovale: Secondary | ICD-10-CM | POA: Diagnosis not present

## 2023-09-13 DIAGNOSIS — R931 Abnormal findings on diagnostic imaging of heart and coronary circulation: Secondary | ICD-10-CM | POA: Diagnosis not present

## 2023-09-13 DIAGNOSIS — I2722 Pulmonary hypertension due to left heart disease: Secondary | ICD-10-CM | POA: Diagnosis present

## 2023-09-13 DIAGNOSIS — F419 Anxiety disorder, unspecified: Secondary | ICD-10-CM | POA: Diagnosis present

## 2023-09-13 DIAGNOSIS — R600 Localized edema: Secondary | ICD-10-CM | POA: Diagnosis present

## 2023-09-13 DIAGNOSIS — R9431 Abnormal electrocardiogram [ECG] [EKG]: Secondary | ICD-10-CM | POA: Insufficient documentation

## 2023-09-13 DIAGNOSIS — I422 Other hypertrophic cardiomyopathy: Secondary | ICD-10-CM | POA: Diagnosis present

## 2023-09-13 DIAGNOSIS — I5021 Acute systolic (congestive) heart failure: Secondary | ICD-10-CM | POA: Diagnosis present

## 2023-09-13 DIAGNOSIS — K219 Gastro-esophageal reflux disease without esophagitis: Secondary | ICD-10-CM | POA: Diagnosis present

## 2023-09-13 DIAGNOSIS — R0602 Shortness of breath: Secondary | ICD-10-CM | POA: Diagnosis not present

## 2023-09-13 DIAGNOSIS — I513 Intracardiac thrombosis, not elsewhere classified: Secondary | ICD-10-CM | POA: Diagnosis not present

## 2023-09-13 DIAGNOSIS — N179 Acute kidney failure, unspecified: Secondary | ICD-10-CM | POA: Diagnosis present

## 2023-09-13 DIAGNOSIS — Z833 Family history of diabetes mellitus: Secondary | ICD-10-CM | POA: Diagnosis not present

## 2023-09-13 DIAGNOSIS — I429 Cardiomyopathy, unspecified: Secondary | ICD-10-CM | POA: Diagnosis not present

## 2023-09-13 DIAGNOSIS — Z794 Long term (current) use of insulin: Secondary | ICD-10-CM | POA: Diagnosis not present

## 2023-09-13 DIAGNOSIS — Z7982 Long term (current) use of aspirin: Secondary | ICD-10-CM | POA: Diagnosis not present

## 2023-09-13 DIAGNOSIS — I872 Venous insufficiency (chronic) (peripheral): Secondary | ICD-10-CM | POA: Diagnosis present

## 2023-09-13 DIAGNOSIS — I440 Atrioventricular block, first degree: Secondary | ICD-10-CM | POA: Diagnosis present

## 2023-09-13 DIAGNOSIS — Z79899 Other long term (current) drug therapy: Secondary | ICD-10-CM | POA: Diagnosis not present

## 2023-09-13 DIAGNOSIS — I5022 Chronic systolic (congestive) heart failure: Secondary | ICD-10-CM | POA: Diagnosis not present

## 2023-09-13 DIAGNOSIS — Z789 Other specified health status: Secondary | ICD-10-CM | POA: Diagnosis not present

## 2023-09-13 DIAGNOSIS — I4510 Unspecified right bundle-branch block: Secondary | ICD-10-CM | POA: Diagnosis present

## 2023-09-13 DIAGNOSIS — E785 Hyperlipidemia, unspecified: Secondary | ICD-10-CM | POA: Diagnosis present

## 2023-09-13 DIAGNOSIS — E1165 Type 2 diabetes mellitus with hyperglycemia: Secondary | ICD-10-CM | POA: Diagnosis present

## 2023-09-13 DIAGNOSIS — I2510 Atherosclerotic heart disease of native coronary artery without angina pectoris: Secondary | ICD-10-CM | POA: Diagnosis present

## 2023-09-13 DIAGNOSIS — Z7985 Long-term (current) use of injectable non-insulin antidiabetic drugs: Secondary | ICD-10-CM | POA: Diagnosis not present

## 2023-09-13 DIAGNOSIS — I3139 Other pericardial effusion (noninflammatory): Secondary | ICD-10-CM | POA: Diagnosis present

## 2023-09-13 DIAGNOSIS — F32A Depression, unspecified: Secondary | ICD-10-CM | POA: Diagnosis present

## 2023-09-13 DIAGNOSIS — I110 Hypertensive heart disease with heart failure: Secondary | ICD-10-CM | POA: Diagnosis present

## 2023-09-13 DIAGNOSIS — I509 Heart failure, unspecified: Secondary | ICD-10-CM | POA: Diagnosis not present

## 2023-09-13 DIAGNOSIS — I69354 Hemiplegia and hemiparesis following cerebral infarction affecting left non-dominant side: Secondary | ICD-10-CM | POA: Diagnosis not present

## 2023-09-13 DIAGNOSIS — E039 Hypothyroidism, unspecified: Secondary | ICD-10-CM | POA: Diagnosis present

## 2023-09-13 DIAGNOSIS — G4733 Obstructive sleep apnea (adult) (pediatric): Secondary | ICD-10-CM | POA: Diagnosis present

## 2023-09-13 LAB — ECHOCARDIOGRAM COMPLETE
Area-P 1/2: 3.3 cm2
Calc EF: 54.1 %
Height: 72 in
S' Lateral: 3.6 cm
Single Plane A2C EF: 52.3 %
Single Plane A4C EF: 52.3 %
Weight: 3840 [oz_av]

## 2023-09-13 LAB — HIV ANTIBODY (ROUTINE TESTING W REFLEX): HIV Screen 4th Generation wRfx: NONREACTIVE

## 2023-09-13 LAB — CBC
HCT: 37.6 % — ABNORMAL LOW (ref 39.0–52.0)
Hemoglobin: 12.3 g/dL — ABNORMAL LOW (ref 13.0–17.0)
MCH: 32.5 pg (ref 26.0–34.0)
MCHC: 32.7 g/dL (ref 30.0–36.0)
MCV: 99.5 fL (ref 80.0–100.0)
Platelets: 131 10*3/uL — ABNORMAL LOW (ref 150–400)
RBC: 3.78 MIL/uL — ABNORMAL LOW (ref 4.22–5.81)
RDW: 14.9 % (ref 11.5–15.5)
WBC: 4.7 10*3/uL (ref 4.0–10.5)
nRBC: 0 % (ref 0.0–0.2)

## 2023-09-13 LAB — CBG MONITORING, ED
Glucose-Capillary: 304 mg/dL — ABNORMAL HIGH (ref 70–99)
Glucose-Capillary: 261 mg/dL — ABNORMAL HIGH (ref 70–99)

## 2023-09-13 LAB — LIPID PANEL
Cholesterol: 144 mg/dL (ref 0–200)
HDL: 57 mg/dL (ref >40)
LDL Cholesterol: 80 mg/dL (ref 0–99)
Total CHOL/HDL Ratio: 2.5 ratio
Triglycerides: 37 mg/dL (ref <150)
VLDL: 7 mg/dL (ref 0–40)

## 2023-09-13 LAB — GLUCOSE, CAPILLARY
Glucose-Capillary: 229 mg/dL — ABNORMAL HIGH (ref 70–99)
Glucose-Capillary: 279 mg/dL — ABNORMAL HIGH (ref 70–99)

## 2023-09-13 LAB — URINALYSIS, W/ REFLEX TO CULTURE (INFECTION SUSPECTED)
Bacteria, UA: NONE SEEN
Bilirubin Urine: NEGATIVE
Glucose, UA: >=500 mg/dL — AB
Hgb urine dipstick: NEGATIVE
Ketones, ur: 5 mg/dL — AB
Leukocytes,Ua: NEGATIVE
Nitrite: NEGATIVE
Protein, ur: 30 mg/dL — AB
Specific Gravity, Urine: 1.017 (ref 1.005–1.030)
pH: 8 (ref 5.0–8.0)

## 2023-09-13 LAB — BASIC METABOLIC PANEL WITH GFR
Anion gap: 11 (ref 5–15)
BUN: 11 mg/dL (ref 8–23)
CO2: 27 mmol/L (ref 22–32)
Calcium: 8.7 mg/dL — ABNORMAL LOW (ref 8.9–10.3)
Chloride: 100 mmol/L (ref 98–111)
Creatinine, Ser: 0.75 mg/dL (ref 0.61–1.24)
GFR, Estimated: >60 mL/min (ref >60)
Glucose, Bld: 296 mg/dL — ABNORMAL HIGH (ref 70–99)
Potassium: 4.3 mmol/L (ref 3.5–5.1)
Sodium: 138 mmol/L (ref 135–145)

## 2023-09-13 LAB — BRAIN NATRIURETIC PEPTIDE: B Natriuretic Peptide: 816 pg/mL — ABNORMAL HIGH (ref 0.0–100.0)

## 2023-09-13 LAB — HEMOGLOBIN A1C
Hgb A1c MFr Bld: 11.8 % — ABNORMAL HIGH (ref 4.8–5.6)
Mean Plasma Glucose: 291.96 mg/dL

## 2023-09-13 LAB — TROPONIN I (HIGH SENSITIVITY)
Troponin I (High Sensitivity): 18 ng/L — ABNORMAL HIGH (ref <18)
Troponin I (High Sensitivity): 20 ng/L — ABNORMAL HIGH (ref <18)
Troponin I (High Sensitivity): 25 ng/L — ABNORMAL HIGH (ref <18)

## 2023-09-13 LAB — TSH: TSH: 10.157 u[IU]/mL — ABNORMAL HIGH (ref 0.350–4.500)

## 2023-09-13 MED ORDER — FUROSEMIDE 10 MG/ML IJ SOLN
40.0000 mg | Freq: Once | INTRAMUSCULAR | Status: AC
  Administered 2023-09-13: 40 mg via INTRAVENOUS
  Filled 2023-09-13: qty 4

## 2023-09-13 MED ORDER — LOSARTAN POTASSIUM 50 MG PO TABS: 25.0000 mg | ORAL_TABLET | Freq: Every day | ORAL | Status: DC

## 2023-09-13 MED ORDER — HYDROCHLOROTHIAZIDE 12.5 MG PO TABS
12.5000 mg | ORAL_TABLET | Freq: Once | ORAL | Status: AC
  Administered 2023-09-13: 12.5 mg via ORAL
  Filled 2023-09-13: qty 1

## 2023-09-13 MED ORDER — LEVOTHYROXINE SODIUM 25 MCG PO TABS
125.0000 ug | ORAL_TABLET | Freq: Every day | ORAL | Status: DC
  Administered 2023-09-13: 125 ug via ORAL
  Filled 2023-09-13: qty 1

## 2023-09-13 MED ORDER — ACETAMINOPHEN 325 MG PO TABS
325.0000 mg | ORAL_TABLET | ORAL | Status: DC | PRN
  Administered 2023-09-15 – 2023-09-19 (×3): 650 mg via ORAL
  Filled 2023-09-13 (×3): qty 2

## 2023-09-13 MED ORDER — INSULIN GLARGINE-YFGN 100 UNIT/ML ~~LOC~~ SOLN
20.0000 [IU] | Freq: Every day | SUBCUTANEOUS | Status: DC
  Administered 2023-09-13: 20 [IU] via SUBCUTANEOUS

## 2023-09-13 MED ORDER — HYDROCHLOROTHIAZIDE 25 MG PO TABS
25.0000 mg | ORAL_TABLET | Freq: Every day | ORAL | Status: DC
  Administered 2023-09-14 – 2023-09-16 (×3): 25 mg via ORAL
  Filled 2023-09-13 (×3): qty 1

## 2023-09-13 MED ORDER — CARVEDILOL 12.5 MG PO TABS
12.5000 mg | ORAL_TABLET | Freq: Two times a day (BID) | ORAL | Status: DC
  Administered 2023-09-13 – 2023-09-18 (×8): 12.5 mg via ORAL
  Filled 2023-09-13 (×14): qty 1

## 2023-09-13 MED ORDER — LEVOTHYROXINE SODIUM 100 MCG PO TABS
100.0000 ug | ORAL_TABLET | Freq: Every day | ORAL | Status: DC
  Administered 2023-09-14 – 2023-09-19 (×6): 100 ug via ORAL
  Filled 2023-09-13 (×6): qty 1

## 2023-09-13 MED ORDER — HYDROCHLOROTHIAZIDE 12.5 MG PO TABS
12.5000 mg | ORAL_TABLET | Freq: Two times a day (BID) | ORAL | Status: DC
  Administered 2023-09-13 (×2): 12.5 mg via ORAL
  Filled 2023-09-13 (×2): qty 1

## 2023-09-13 MED ORDER — ASPIRIN 81 MG PO TBEC
81.0000 mg | DELAYED_RELEASE_TABLET | Freq: Every day | ORAL | Status: DC
  Administered 2023-09-13 – 2023-09-16 (×4): 81 mg via ORAL
  Filled 2023-09-13 (×4): qty 1

## 2023-09-13 MED ORDER — INSULIN ASPART 100 UNIT/ML IJ SOLN
0.0000 [IU] | Freq: Three times a day (TID) | INTRAMUSCULAR | Status: DC
  Administered 2023-09-13 (×2): 8 [IU] via SUBCUTANEOUS
  Administered 2023-09-13: 5 [IU] via SUBCUTANEOUS
  Administered 2023-09-14: 3 [IU] via SUBCUTANEOUS
  Administered 2023-09-14: 8 [IU] via SUBCUTANEOUS
  Administered 2023-09-14: 5 [IU] via SUBCUTANEOUS
  Administered 2023-09-15: 3 [IU] via SUBCUTANEOUS
  Administered 2023-09-15: 2 [IU] via SUBCUTANEOUS
  Administered 2023-09-15: 3 [IU] via SUBCUTANEOUS
  Administered 2023-09-16: 5 [IU] via SUBCUTANEOUS
  Administered 2023-09-16: 2 [IU] via SUBCUTANEOUS
  Administered 2023-09-17 (×2): 3 [IU] via SUBCUTANEOUS
  Administered 2023-09-17: 2 [IU] via SUBCUTANEOUS
  Administered 2023-09-18: 3 [IU] via SUBCUTANEOUS
  Administered 2023-09-18: 2 [IU] via SUBCUTANEOUS
  Administered 2023-09-18: 3 [IU] via SUBCUTANEOUS
  Administered 2023-09-19: 2 [IU] via SUBCUTANEOUS
  Administered 2023-09-19: 3 [IU] via SUBCUTANEOUS

## 2023-09-13 MED ORDER — ENOXAPARIN SODIUM 40 MG/0.4ML IJ SOSY
40.0000 mg | PREFILLED_SYRINGE | Freq: Every day | INTRAMUSCULAR | Status: DC
  Administered 2023-09-13 – 2023-09-16 (×4): 40 mg via SUBCUTANEOUS
  Filled 2023-09-13 (×4): qty 0.4

## 2023-09-13 MED ORDER — LOSARTAN POTASSIUM 50 MG PO TABS
100.0000 mg | ORAL_TABLET | Freq: Every day | ORAL | Status: DC
  Administered 2023-09-13 – 2023-09-16 (×4): 100 mg via ORAL
  Filled 2023-09-13 (×4): qty 2

## 2023-09-13 MED ORDER — DULOXETINE HCL 30 MG PO CPEP
30.0000 mg | ORAL_CAPSULE | Freq: Two times a day (BID) | ORAL | Status: DC
  Administered 2023-09-13 – 2023-09-19 (×13): 30 mg via ORAL
  Filled 2023-09-13 (×13): qty 1

## 2023-09-13 MED ORDER — INSULIN GLARGINE-YFGN 100 UNIT/ML ~~LOC~~ SOLN
20.0000 [IU] | Freq: Every day | SUBCUTANEOUS | Status: DC
  Filled 2023-09-13: qty 0.2

## 2023-09-13 MED ORDER — FUROSEMIDE 10 MG/ML IJ SOLN
40.0000 mg | Freq: Two times a day (BID) | INTRAMUSCULAR | Status: DC
  Administered 2023-09-13 – 2023-09-16 (×6): 40 mg via INTRAVENOUS
  Filled 2023-09-13 (×6): qty 4

## 2023-09-13 MED ORDER — CARVEDILOL 12.5 MG PO TABS: 12.5000 mg | ORAL_TABLET | Freq: Two times a day (BID) | ORAL | Status: DC

## 2023-09-13 MED ORDER — INSULIN GLARGINE-YFGN 100 UNIT/ML ~~LOC~~ SOLN
10.0000 [IU] | Freq: Once | SUBCUTANEOUS | Status: AC
  Administered 2023-09-13: 10 [IU] via SUBCUTANEOUS
  Filled 2023-09-13: qty 0.1

## 2023-09-13 MED ORDER — GABAPENTIN 300 MG PO CAPS
300.0000 mg | ORAL_CAPSULE | Freq: Three times a day (TID) | ORAL | Status: DC
  Administered 2023-09-13 – 2023-09-19 (×19): 300 mg via ORAL
  Filled 2023-09-13 (×19): qty 1

## 2023-09-13 MED ORDER — INSULIN GLARGINE-YFGN 100 UNIT/ML ~~LOC~~ SOLN
30.0000 [IU] | Freq: Every day | SUBCUTANEOUS | Status: DC
  Administered 2023-09-14: 30 [IU] via SUBCUTANEOUS
  Filled 2023-09-13: qty 0.3

## 2023-09-13 MED ORDER — VITAMIN C 500 MG PO TABS
500.0000 mg | ORAL_TABLET | Freq: Every day | ORAL | Status: DC
  Administered 2023-09-13 – 2023-09-18 (×6): 500 mg via ORAL
  Filled 2023-09-13 (×6): qty 1

## 2023-09-13 MED ORDER — ROSUVASTATIN CALCIUM 20 MG PO TABS
40.0000 mg | ORAL_TABLET | Freq: Every day | ORAL | Status: DC
  Administered 2023-09-13 – 2023-09-19 (×7): 40 mg via ORAL
  Filled 2023-09-13 (×7): qty 2

## 2023-09-13 MED ORDER — BUSPIRONE HCL 10 MG PO TABS
10.0000 mg | ORAL_TABLET | Freq: Three times a day (TID) | ORAL | Status: DC
  Administered 2023-09-13 – 2023-09-19 (×19): 10 mg via ORAL
  Filled 2023-09-13 (×19): qty 1

## 2023-09-13 NOTE — Assessment & Plan Note (Addendum)
 RBBB and first-degree AV block on ED EKG, consistent with prior EKGs.  Flat troponins (18 > 20) and no CP/SOB on exam --Repeat EKG in a.m.

## 2023-09-13 NOTE — ED Notes (Signed)
 Patient transported to MRI

## 2023-09-13 NOTE — Assessment & Plan Note (Addendum)
 Pitting edema to LLE > RLE with weeping sores on LLE and chronic venous insufficiency skin changes on BLE.  Highest concern for new onset CHF, last echo December 2024 showed EF 55 to 60%, severe LVH, G1DD.  Less likely acute nephrotic syndrome given significantly elevated MCR of 430 in Feb 25 iso poorly controlled DM. Can further investigate for acute renal dysfunction if echo is normal. S/p one-time IV Lasix  40 mg with good UOP. -- Admit to FMTS, med-telemetry, Dr. Madelon attending -- Complete echocardiogram  -- Consider cardiology consult if abnormal -- Redosed IV Lasix  40 mg in AM --Tylenol  every 4 hours as needed for BLE pain -- Telemetry -- Strict I's/O's -- Daily weights --TED hose --Consider outpatient referral for LLE wounds -- Consider ABIs --Vitals per unit -- AM BMP, CBC, HA1C, lipid panel, TSH -- PT/OT eval and treat --TOC for medication assistance

## 2023-09-13 NOTE — Progress Notes (Deleted)
 FMTS Interim Progress Note  S: Patient admitted overnight.  Doing well this morning and laying comfortably in bed.  Endorses continuous leg pain, but has had good diuresis.  O: BP (!) 158/81 (BP Location: Right Arm)   Pulse (!) 58   Temp 98.2 F (36.8 C) (Oral)   Resp 20   Ht 6' (1.829 m)   Wt 103.2 kg   SpO2 97%   BMI 30.86 kg/m   General: Awake and Alert in NAD HEENT: NCAT. Sclera anicteric. No rhinorrhea. Cardiovascular: RRR. No M/R/G Respiratory: CTAB, normal WOB on RA. No wheezing, crackles, rhonchi, or diminished breath sounds. Abdomen: Soft, non-tender, non-distended. Bowel sounds normoactive Extremities: Able to move all extremities. 1+ BLE edema, no deformities or significant joint findings. Skin: Warm and dry. Chronic venous stasis wounds in LLE > RLE w/ TTP Neuro: A&Ox3. No focal neurological deficits.  A/P: LE Edema 2/2 Venous Stasis vs new-onset CHF Leg swelling likely secondary to venous stasis due to appearance.  With increased pain upon palpation we will consider further workup to rule out acute pathology. - L MRI Tib/Fib - F/u Echo - Redose IV Lasix  40 mg - AM BMP, Mag - Daily weights; strict I's/O - Continue HCTZ 25 mg - Troponin (18 > 20) - Consult cardiology s/p echo  HTN Hypertensive overnight (160s-180s/80s). - Increase losartan  to 100 mg daily and continue HCTZ 25 mg  T2DM CBGs elvated since admission.  - Increase Semglee  to 30 units daily  Hypothyroidism TSH 10.157, was not taking levothyroxine  at home as seen on home med rec. - Decreased levothyroxine  to 100 mg daily, to see how its tolerated - Recheck TSH in 6 weeks outpatient  Rest of plan and history per H&P  Janna Ferrier, DO 09/13/2023, 3:10 PM PGY-1, [facility] Family Medicine Service pager [phone]

## 2023-09-13 NOTE — Inpatient Diabetes Management (Signed)
 Inpatient Diabetes Program Recommendations  AACE/ADA: New Consensus Statement on Inpatient Glycemic Control (2015)  Target Ranges:  Prepandial:   less than 140 mg/dL      Peak postprandial:   less than 180 mg/dL (1-2 hours)      Critically ill patients:  140 - 180 mg/dL   Lab Results  Component Value Date   GLUCAP 261 (H) 09/13/2023   HGBA1C 11.8 (H) 09/13/2023    Review of Glycemic Control  Diabetes history: DM2 Outpatient Diabetes medications: Levemir  40 units at HS, Novolog  15-20 units scale TID, Trulicity  3 mg weekly Current orders for Inpatient glycemic control: Semglee  30 units daily, Novolog  0-15 units correction scale TID  Inpatient Diabetes Program Recommendations:   Spoke with patient at the bedside. Patient states that he was diagnosed with diabetes about 30 years ago. He has been on insulin  for about 10 years. States that his wife has diabetes. He does take his insulin  most everyday, but sometimes skips the Trulicity  until his wife reminds him of it. States that he does not usually eat 3 meals per day, but does take his Novolog  TID per blood sugar. Discussed his HgbA1C of 11.8%; patient states that he had an A1C of 7% about 1 year ago. He does have a PCP whom he saw in March, 2025.   Will continue to monitor blood sugars while in the hospital.  Marjorie Lunger RN BSN CDE Diabetes Coordinator Pager: [phone]  8am-5pm

## 2023-09-13 NOTE — Hospital Course (Signed)
 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 His hospital course is detailed below:  HFmrEF Patient presented with several months of L>R b/l LE pitting edema.  MRI tibia/fibula was conducted to rule out infection d/t chronic wounds noted 2/2 chronic venous stasis, negative for osteomyelitis.  With concern for both chronic venous stasis and possible CHF, echocardiogram was performed which demonstrated EF of 45 to 50% however endocardium was not well-visualized, repeat echocardiogram with contrast was ordered by cardiology and showed***.  Patient was started on GDMT including Coreg  12.5 mg, HCTZ 25 mg, Cozaar  300 mg daily, and Spironolactone 25 mg *** with the goal to transition to Entresto.  Patient also had IV diuresis with Lasix  40 mg and output ***liters and***pounds from admission.  Dry weight upon discharge ***.  Patient was not started on SGLT2i given propensity for peripheral vascular complications.  Electrolytes were monitored daily and repleted as appropriate.  Other chronic conditions were medically managed with home medications and formulary alternatives as necessary (T2DM, HTN, hypothyroid, HLD, anxiety/depression, history of CVA, OSA)  PCP Follow-up Recommendations: Follow-up with cardiology. Repeat BMP in approximately 1 week from discharge. Recheck TSH in about 4 weeks to assess if Synthroid  100 mcg is an adequate dose for his hypothyroidism. Consider repeating sleep study outpatient to adjust CPAP setting or follow up with pulmonology. Consider outpatient MBS with SLP if concern for decreased ability with swallowing. Consider restarting GLP-1 outpatient, and SGLT2i once A1c improves.

## 2023-09-13 NOTE — Consult Note (Signed)
 Cardiology Consultation:   Patient ID: Trice Aspinall; [id number]; Aug 05, 1955   Admit date: 09/12/2023 Date of Consult: 09/13/2023  Primary Care Provider: Howell Lunger, DO Primary Cardiologist: None  Primary Electrophysiologist:  Cindie   Patient Profile:   Wash Nienhaus is a 68 y.o. male with a hx of HTN diabetes mellitus who is being seen today for the evaluation of lower extremity swelling and a reduced ejection fraction at the request of  Dr. Madelon.  History of Present Illness:   Mr. Tugwell without prior cardiac history.  He has a history of hypertension diabetes and a previous CVA with resultant left-sided hemiparesis.  He has had chronic lower extremity swelling with a report of lymphedema.  He came in because he was starting to have weeping leg edema.  He has a nonhealing ulcer.  He has had some increased dyspnea on exertion. He has a history of hypothyroidism and on presentation his TSH is 10.157 but he is likely not been taking his meds.  He has poorly controlled diabetes with an A1c of 11.8.    He was found on echo to have an EF of 45 to 50%.  He has severe concentric left ventricular hypertrophy.  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  There is no evidence of mitral regurgitation.  Small patent foramen ovale.    He lives at home with his wife who is a retired Transport planner.  She was on the phone and reports that he is not adherent to medications.  He gets around the house with a walker.  His feet are on the ground a lot.  He drinks probably too much water and uses a lot of salt.  He does not really take his medications routinely.  His wife made him come to the hospital because he had increased lower extremity swelling with weeping edema and he also has a wound with some pus on left medial anterior surface of his lower leg.  He has not been having any fevers chills.  He has been having some increasing shortness of breath.  He chronically sleeps with his head elevated.  He does wear CPAP.   He is not really describing substernal chest pressure, neck or arm discomfort.  He had a cough nonproductive.  Of note he does have an implanted loop recorder following his CVA but no evidence of dysrhythmia.   Past Medical History:  Diagnosis Date   Anxiety    Arthritis    Depression    Diabetes mellitus without complication (HCC)    Hypertensive crisis 02/22/2023   Hypothyroidism    Sleep apnea    wears CPAP   Stroke (HCC)    1 stroke 2/24, and 2 strokes on 03/09/21   Thyroid disease     Past Surgical History:  Procedure Laterality Date   ANTERIOR CERVICAL DECOMP/DISCECTOMY FUSION N/A 11/23/2022   Procedure: Anterior Cervical Decompression Fusion  Cervical four-five;  Surgeon: Lanis Pupa, MD;  Location: [facility] OR;  Service: Neurosurgery;  Laterality: N/A;   GROIN DEBRIDEMENT Left    ingrown hair, possibly abscesses, surgery to treat infection   HERNIA REPAIR Left    inguinal   IR ANGIO INTRA EXTRACRAN SEL COM CAROTID INNOMINATE BILAT MOD SED  03/09/2021   IR ANGIO VERTEBRAL SEL VERTEBRAL BILAT MOD SED  03/09/2021   IR US  GUIDE VASC ACCESS RIGHT  03/09/2021   LOOP RECORDER INSERTION N/A 05/07/2022   Procedure: LOOP RECORDER INSERTION;  Surgeon: Cindie Ole DASEN, MD;  Location: [facility] INVASIVE CV  LAB;  Service: Cardiovascular;  Laterality: N/A;     Home Medications:  Prior to Admission medications   Medication Sig Start Date End Date Taking? Authorizing Provider  acetaminophen  (TYLENOL ) 325 MG tablet Take 1-2 tablets (325-650 mg total) by mouth every 4 (four) hours as needed for mild pain (pain score 1-3). 03/11/23  Yes Love, Sharlet RAMAN, PA-C  ascorbic acid  (VITAMIN C ) 500 MG tablet Take 1 tablet (500 mg total) by mouth daily with supper. 03/11/23  Yes Love, Sharlet RAMAN, PA-C  aspirin  EC 81 MG tablet Take 1 tablet (81 mg total) by mouth daily. Swallow whole. 06/03/23  Yes Rosemarie Eather RAMAN, MD  busPIRone  (BUSPAR ) 10 MG tablet Take 10 mg by mouth 3 (three) times daily. 02/06/23  Yes  [provider]  carvedilol  (COREG ) 12.5 MG tablet TAKE 1 TABLET (12.5MG  TOTAL) BY MOUTH TWICE A DAY WITH MEALS 09/09/23  Yes Babs Arthea DASEN, MD  cyclobenzaprine  (FLEXERIL ) 10 MG tablet Take 10 mg by mouth 2 (two) times daily as needed. 06/08/23  Yes [provider]  docusate sodium  (COLACE) 100 MG capsule Take 100 mg by mouth daily.   Yes [provider]  Dulaglutide  (TRULICITY ) 3 MG/0.5ML SOPN Inject 3 mg into the skin once a week. Patient taking differently: Inject 3 mg into the skin once a week. Inject on Sunday 05/23/22  Yes Angiulli, Toribio PARAS, PA-C  DULoxetine  (CYMBALTA ) 30 MG capsule Take 30 mg by mouth 2 (two) times daily.   Yes [provider]  gabapentin  (NEURONTIN ) 300 MG capsule Take 1 capsule (300 mg total) by mouth 3 (three) times daily. 10/10/22  Yes Babs Arthea DASEN, MD  guanFACINE (INTUNIV) 2 MG TB24 ER tablet Take 2 mg by mouth daily. 06/07/23  Yes [provider]  hydrochlorothiazide  (HYDRODIURIL ) 12.5 MG tablet Take 1 tablet (12.5 mg total) by mouth 2 (two) times daily. Patient taking differently: Take 12.5 mg by mouth 3 (three) times daily. 03/11/23  Yes Love, Sharlet RAMAN, PA-C  ibuprofen  (ADVIL ) 200 MG tablet Take 400 mg by mouth every 6 (six) hours as needed for headache or mild pain (pain score 1-3).   Yes [provider]  insulin  detemir (LEVEMIR ) 100 UNIT/ML FlexPen Inject 23 Units into the skin at bedtime. Patient taking differently: Inject 40 Units into the skin at bedtime. 03/11/23  Yes Love, Sharlet RAMAN, PA-C  losartan  (COZAAR ) 25 MG tablet Take 1 tablet (25 mg total) by mouth at bedtime. 04/26/23  Yes Howell Lunger, DO  Multiple Vitamins-Minerals (ALIVE MENS GUMMY MULTIVITAMINS) CHEW Chew 3 tablets by mouth daily.   Yes [provider]  NOVOLOG  FLEXPEN 100 UNIT/ML FlexPen Inject 15-20 Units into the skin 3 (three) times daily with meals. Inject 15-20 units into the skin 3 times daily with meals 06/11/23  Yes  [provider]  potassium chloride  SA (KLOR-CON  M20) 20 MEQ tablet Take 1 tablet (20 mEq total) by mouth daily. 03/11/23  Yes Love, Sharlet RAMAN, PA-C  rosuvastatin  (CRESTOR ) 40 MG tablet Take 1 tablet (40 mg total) by mouth daily. 03/11/23  Yes Love, Sharlet RAMAN, PA-C  traZODone  (DESYREL ) 100 MG tablet Take 100 mg by mouth at bedtime as needed for sleep. 09/02/23  Yes [provider]    Inpatient Medications: Scheduled Meds:  ascorbic acid   500 mg Oral Q supper   aspirin  EC  81 mg Oral Daily   busPIRone   10 mg Oral TID   carvedilol   12.5 mg Oral BID WC   DULoxetine   30  mg Oral BID   enoxaparin  (LOVENOX ) injection  40 mg Subcutaneous Daily   gabapentin   300 mg Oral TID   [START ON 09/14/2023] hydrochlorothiazide   25 mg Oral Daily   insulin  aspart  0-15 Units Subcutaneous TID WC   insulin  glargine-yfgn  10 Units Subcutaneous Once   [START ON 09/14/2023] insulin  glargine-yfgn  30 Units Subcutaneous Daily   [START ON 09/14/2023] levothyroxine   100 mcg Oral Q0600   losartan   100 mg Oral QHS   rosuvastatin   40 mg Oral Daily   Continuous Infusions:  PRN Meds: acetaminophen   Allergies:   No Known Allergies  Social History:   Social History   Socioeconomic History   Marital status: Married    Spouse name: Not on file   Number of children: 3   Years of education: Not on file   Highest education level: Not on file  Occupational History   Not on file  Tobacco Use   Smoking status: Never   Smokeless tobacco: Never  Vaping Use   Vaping status: Never Used  Substance and Sexual Activity   Alcohol use: No   Drug use: No   Sexual activity: Not on file  Other Topics Concern   Not on file  Social History Narrative   Not on file   Social Drivers of Health   Financial Resource Strain: Not on file  Food Insecurity: No Food Insecurity (09/13/2023)   Hunger Vital Sign    Worried About Running Out of Food in the Last Year: Never true    Ran Out of Food in the Last Year:  Never true  Transportation Needs: No Transportation Needs (09/13/2023)   PRAPARE - Transportation    Lack of Transportation (Medical): No    Lack of Transportation (Non-Medical): No  Physical Activity: Not on file  Stress: Not on file  Social Connections: Moderately Isolated (09/13/2023)   Social Connection and Isolation Panel    Frequency of Communication with Friends and Family: Three times a week    Frequency of Social Gatherings with Friends and Family: Once a week    Attends Religious Services: Never    Database administrator or Organizations: No    Attends Banker Meetings: Never    Marital Status: Married  Catering manager Violence: Not At Risk (09/13/2023)   Humiliation, Afraid, Rape, and Kick questionnaire    Fear of Current or Ex-Partner: No    Emotionally Abused: No    Physically Abused: No    Sexually Abused: No    Family History:    Family History  Problem Relation Age of Onset   Diabetes Father      ROS:  Please see the history of present illness.  As stated in the HPI and negative for all other systems. Physical Exam/Data:   Vitals:   09/13/23 1000 09/13/23 1100 09/13/23 1344 09/13/23 1419  BP:  (!) 173/89 130/68 (!) 158/81  Pulse:  (!) 57 (!) 59 (!) 58  Resp:  (!) 0 (!) 22 20  Temp:   98 F (36.7 C) 98.2 F (36.8 C)  TempSrc:    Oral  SpO2:  96% 98% 97%  Weight: 108.9 kg   103.2 kg  Height: 6' (1.829 m)   6' (1.829 m)    Intake/Output Summary (Last 24 hours) at 09/13/2023 1611 Last data filed at 09/13/2023 1516 Gross per 24 hour  Intake --  Output 5105 ml  Net -5105 ml   Filed Weights   09/13/23 1000 09/13/23  1419  Weight: 108.9 kg 103.2 kg   Body mass index is 30.86 kg/m.  GENERAL: Chronically ill appearing HEENT:   Pupils equal round and reactive, fundi not visualized, oral mucosa unremarkable NECK:    Positve jugular venous distention, waveform within normal limits, carotid upstroke brisk and symmetric, no bruits, no  thyromegaly LYMPHATICS:  No cervical, inguinal adenopathy LUNGS:   Clear to auscultation bilaterally BACK:  No CVA tenderness CHEST:   Unremarkable HEART:  PMI not displaced or sustained,S1 and S2 within normal limits, no S3, no S4, no clicks, no rubs, no murmurs ABD:  Flat, positive bowel sounds normal in frequency in pitch, no bruits, no rebound, no guarding, no midline pulsatile mass, no hepatomegaly, no splenomegaly EXT:  2 plus pulses throughout, moderate bilateral lower extremity edema, no cyanosis no clubbing SKIN:  No rashes no nodules NEURO:   Cranial nerves II through XII grossly intact, motor grossly intact throughout PSYCH:    Cognitively intact, oriented to person place and time   EKG:  The EKG was personally reviewed and demonstrates: Sinus rhythm, prolonged PR interval with first-degree heart block, right bundle branch block.  No change from previous EKG. Telemetry:  Telemetry was personally reviewed and demonstrates: Sinus rhythm  Relevant CV Studies: 1. Left ventricular ejection fraction, by estimation, is 45 to 50%. The  left ventricle has mildly decreased function. The left ventricle  demonstrates regional wall motion abnormalities (see scoring  diagram/findings for description). There is severe  concentric left ventricular hypertrophy. Left ventricular diastolic  parameters are consistent with Grade II diastolic dysfunction  (pseudonormalization).   2. Right ventricular systolic function is normal. The right ventricular  size is normal.   3. Left atrial size was moderately dilated.   4. 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 The mitral valve is normal in structure. Trivial  mitral valve regurgitation. No evidence of mitral stenosis.   5. The aortic valve is tricuspid. There is mild calcification of the  aortic valve. Aortic valve regurgitation is not visualized. Aortic valve  sclerosis/calcification is present, without any evidence of aortic  stenosis.   6. The inferior vena cava  is dilated in size with <50% respiratory  variability, suggesting right atrial pressure of 15 mmHg.   7. Evidence of atrial level shunting detected by color flow Doppler.  There is a small patent foramen ovale with predominantly left to right  shunting across the atrial septum.    Laboratory Data:  Chemistry Recent Labs  Lab 09/12/23 2212 09/12/23 2317 09/13/23 0442  NA 136 138 138  K 4.1 4.0 4.3  CL 100  --  100  CO2 25  --  27  GLUCOSE 315*  --  296*  BUN 13  --  11  CREATININE 0.73  --  0.75  CALCIUM  8.9  --  8.7*  GFRNONAA >60  --  >60  ANIONGAP 11  --  11    Recent Labs  Lab 09/12/23 2212  PROT 7.1  ALBUMIN 3.5  AST 27  ALT 18  ALKPHOS 123  BILITOT 1.3*   Hematology Recent Labs  Lab 09/12/23 2212 09/12/23 2317 09/13/23 0442  WBC 5.0  --  4.7  RBC 3.94*  --  3.78*  HGB 12.9* 13.3 12.3*  HCT 38.7* 39.0 37.6*  MCV 98.2  --  99.5  MCH 32.7  --  32.5  MCHC 33.3  --  32.7  RDW 14.8  --  14.9  PLT 141*  --  131*   Cardiac EnzymesNo results  for input(s): TROPONINI in the last 168 hours. No results for input(s): TROPIPOC in the last 168 hours.  BNP Recent Labs  Lab 09/12/23 2212  BNP 816.0*    DDimer No results for input(s): DDIMER in the last 168 hours.  Radiology/Studies:  MR TIBIA FIBULA LEFT WO CONTRAST Result Date: 09/13/2023 CLINICAL DATA:  Worsening lower leg pain, left lower extremity weeping wound EXAM: MRI OF LOWER LEFT EXTREMITY WITHOUT CONTRAST TECHNIQUE: Multiplanar, multisequence MR imaging of the left tibia/fibula was performed. No intravenous contrast was administered. COMPARISON:  Left knee radiographs 02/25/2023 FINDINGS: Bones/Joint/Cartilage Small knee joint effusion. Degenerative arthropathy of the left knee. No findings of osteomyelitis or fracture. Small Baker's cyst Ligaments N/A Muscles and Tendons Infiltrative edema in the popliteal space and especially tracking between the medial head gastrocnemius and in the soleus.  Plantaris muscle tear cannot be totally excluded given this appearance. Regional muscular atrophy noted. Low-grade edema also tracks superficial to the medial head gastrocnemius. Soft tissues Diffuse subcutaneous edema in the calf with some mild posterolateral sparing. At the level of the knee, the edema is mostly lateral. The edema extends medially and to a lesser extent laterally along the ankle. No drainable abscess. IMPRESSION: 1. Diffuse subcutaneous edema in the calf with some mild posterolateral sparing. This is nonspecific and could reflect cellulitis, lymphedema, venous stasis, or fluid overload. No drainable abscess. 2. Infiltrative edema in the popliteal space and especially tracking between the medial head gastrocnemius and in the soleus. Plantaris muscle tear cannot be totally excluded given this appearance. 3. Small knee joint effusion and small Baker's cyst. 4. Degenerative arthropathy of the left knee. Electronically Signed   By: Ryan Salvage M.D.   On: 09/13/2023 13:47   ECHOCARDIOGRAM COMPLETE Result Date: 09/13/2023    ECHOCARDIOGRAM REPORT   Patient Name:   NEERAJ HOUSAND Date of Exam: 09/13/2023 Medical Rec #:  [id number]        Height:       73.0 in Accession #:    [id number]       Weight:       236.0 lb Date of Birth:  13-Jul-1955        BSA:          2.308 m Patient Age:    68 years         BP:           162/77 mmHg Patient Gender: M                HR:           60 bpm. Exam Location:  Inpatient Procedure: 2D Echo, Cardiac Doppler and Color Doppler (Both Spectral and Color            Flow Doppler were utilized during procedure). Indications:    Edema, orthopnea,elevated BNP  History:        Patient has prior history of Echocardiogram examinations, most                 recent 02/22/2023.  Sonographer:    Therisa Crouch Referring Phys: [id number] MICHAEL M BERO IMPRESSIONS  1. Left ventricular ejection fraction, by estimation, is 45 to 50%. The left ventricle has mildly decreased function.  The left ventricle demonstrates regional wall motion abnormalities (see scoring diagram/findings for description). There is severe concentric left ventricular hypertrophy. Left ventricular diastolic parameters are consistent with Grade II diastolic dysfunction (pseudonormalization).  2. Right ventricular systolic function is normal. The right ventricular size is normal.  3. Left atrial size was moderately dilated.  4. 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 The mitral valve is normal in structure. Trivial mitral valve regurgitation. No evidence of mitral stenosis.  5. The aortic valve is tricuspid. There is mild calcification of the aortic valve. Aortic valve regurgitation is not visualized. Aortic valve sclerosis/calcification is present, without any evidence of aortic stenosis.  6. The inferior vena cava is dilated in size with <50% respiratory variability, suggesting right atrial pressure of 15 mmHg.  7. Evidence of atrial level shunting detected by color flow Doppler. There is a small patent foramen ovale with predominantly left to right shunting across the atrial septum. Conclusion(s)/Recommendation(s): There is severe concentric LVH with chordal SAM. Visually LVH does not seem as prominent on this echo as one fro, 12/24 but when remeasured LVH is severe. No LVOT obstruction. Consider cardiac MRI to evalaute for hypertrophic CM if clinically indicated. FINDINGS  Left Ventricle: Left ventricular ejection fraction, by estimation, is 45 to 50%. The left ventricle has mildly decreased function. The left ventricle demonstrates regional wall motion abnormalities. The left ventricular internal cavity size was normal in size. There is severe concentric left ventricular hypertrophy. Left ventricular diastolic parameters are consistent with Grade II diastolic dysfunction (pseudonormalization).  LV Wall Scoring: The apical septal segment, apical inferior segment, and apex are akinetic. The mid inferolateral segment is hypokinetic. Right  Ventricle: The right ventricular size is normal. No increase in right ventricular wall thickness. Right ventricular systolic function is normal. Left Atrium: Left atrial size was moderately dilated. Right Atrium: Right atrial size was normal in size. Pericardium: There is no evidence of pericardial effusion. 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 The mitral valve is normal in structure. Trivial mitral valve regurgitation. No evidence of mitral valve stenosis. Tricuspid Valve: The tricuspid valve is normal in structure. Tricuspid valve regurgitation is trivial. No evidence of tricuspid stenosis. Aortic Valve: The aortic valve is tricuspid. There is mild calcification of the aortic valve. Aortic valve regurgitation is not visualized. Aortic valve sclerosis/calcification is present, without any evidence of aortic stenosis. Pulmonic Valve: The pulmonic valve was normal in structure. Pulmonic valve regurgitation is not visualized. No evidence of pulmonic stenosis. Aorta: The aortic root is normal in size and structure. Venous: The inferior vena cava is dilated in size with less than 50% respiratory variability, suggesting right atrial pressure of 15 mmHg. IAS/Shunts: Evidence of atrial level shunting detected by color flow Doppler. A small patent foramen ovale is detected with predominantly left to right shunting across the atrial septum.  LEFT VENTRICLE PLAX 2D LVIDd:         5.20 cm      Diastology LVIDs:         3.60 cm      LV e' medial:    6.06 cm/s LV PW:         1.40 cm      LV E/e' medial:  12.9 LV IVS:        1.20 cm      LV e' lateral:   6.09 cm/s LVOT diam:     2.00 cm      LV E/e' lateral: 12.9 LVOT Area:     3.14 cm  LV Volumes (MOD) LV vol d, MOD A2C: 189.0 ml LV vol d, MOD A4C: 115.0 ml LV vol s, MOD A2C: 90.2 ml LV vol s, MOD A4C: 54.8 ml LV SV MOD A2C:     98.8 ml LV SV MOD A4C:  115.0 ml LV SV MOD BP:      81.2 ml RIGHT VENTRICLE            IVC RV Basal diam:  3.50 cm    IVC diam: 2.30 cm RV S prime:      9.03 cm/s TAPSE (M-mode): 2.3 cm LEFT ATRIUM             Index LA diam:        4.70 cm 2.04 cm/m LA Vol (A2C):   92.8 ml 40.20 ml/m LA Vol (A4C):   77.9 ml 33.75 ml/m LA Biplane Vol: 86.0 ml 37.25 ml/m   AORTA Ao Root diam: 3.30 cm Ao Asc diam:  3.00 cm MITRAL VALVE MV Area (PHT): 3.30 cm    SHUNTS MV Decel Time: 230 msec    Systemic Diam: 2.00 cm MV E velocity: 78.40 cm/s MV A velocity: 73.40 cm/s MV E/A ratio:  1.07 Toribio Fuel MD Electronically signed by Toribio Fuel MD Signature Date/Time: 09/13/2023/11:32:38 AM    Final    DG Chest 2 View Result Date: 09/13/2023 CLINICAL DATA:  leg swelling EXAM: CHEST - 2 VIEW COMPARISON:  Chest x-ray 09/07/2023 FINDINGS: Wireless cardiac device overlies left chest. The heart and mediastinal contours are unchanged. Atherosclerotic plaque. Low lung volumes. No focal consolidation. No pulmonary edema. No pleural effusion. No pneumothorax. No acute osseous abnormality. Cervical spine surgical hardware. Bilateral acromioclavicular joint degenerative changes. IMPRESSION: 1. Low lung volumes with no active cardiopulmonary disease. 2.  Aortic Atherosclerosis (ICD10-I70.0). Electronically Signed   By: Morgane  Naveau M.D.   On: 09/13/2023 00:15    Assessment and Plan:   Cardiomyopathy: The patient has a cardiomyopathy.  On this echo it somewhat difficult to assess the endocardium particular at the apex.  He did not use contrast.  I do agree that it is probably around 50%.  There is significant LVH.  This may be lower than the EF in December that study was done with contrast and difficult to compare.  There is also no contrast in February 2024 echo in endocardium is difficult to visualize.  I agree with continuing beta-blocker and increased dose of losartan .  We will eventually titrate and probably use Entresto.  I would avoid SGLT2 inhibitor as I think the propensity for urinary infections and peripheral vascular complications would be high.  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  He will also need evaluation for infiltrative cardiomyopathy based on the LVH although again this is likely related to poorly controlled hypertension.  I will follow this up as an outpatient.  Edema: He had an equivocal Stemmer's test.  I suspect this is a combination of diastolic/systolic heart failure and chronic lymphedema.  He has pus draining from his left leg wound which will be managed per the primary team and he can continue to get IV diuresis.  He is a good urine output with diuretics thus far and I will continue twice daily dosing.  He needs resumption of his thyroid medication.  We talked about salt restriction fluid restriction and keeping his feet elevated.  He is unable to wear compression stockings.   For questions or updates, please contact [facility] Please consult [email or web address] for contact info under Cardiology/STEMI.   Signed, Lynwood Schilling, MD  09/13/2023 4:11 PM

## 2023-09-13 NOTE — ED Notes (Signed)
 Admitting md at bedside

## 2023-09-13 NOTE — Evaluation (Signed)
 Occupational Therapy Evaluation Patient Details Name: Joseph Ortega MRN: [id number] DOB: 1955/11/19 Today's Date: 09/13/2023   History of Present Illness   Pt is a 68 y.o. M presenting to [facility] on 09/13/23 w/ several months of BLE edema. PMH includes Anxiety, Arthritis, Depression, Diabetes mellitus, Headache, HTN, Hypothyroidism, Sleep apnea, R MCA Stroke (left hemi).     Clinical Impressions Joseph Ortega was evaluated s/p the above admission list. He is mod I with RW at baseline, he lives with his wife who is wc level and unable to assist. Upon evaluation the pt was limited by chronic L hemi, unsteady balance, decreased insight to safety and limited activity tolerance. Overall he needed min A for bed mobility, transfers and mobility with RW with 3 mild LOBs. Due to the deficits listed below the pt also needs up to mod A for LB ADLs, set up for UB ADLs and increased time for all tasks. Pt states that he feels relatively close to his functional baseline with the exception of BLE swelling. Pt will benefit from continued acute OT services and HHOT.      If plan is discharge home, recommend the following:   A little help with walking and/or transfers;A little help with bathing/dressing/bathroom;Assistance with cooking/housework;Assist for transportation;Help with stairs or ramp for entrance     Functional Status Assessment   Patient has had a recent decline in their functional status and demonstrates the ability to make significant improvements in function in a reasonable and predictable amount of time.     Equipment Recommendations   None recommended by OT      Precautions/Restrictions   Precautions Precautions: Fall Recall of Precautions/Restrictions: Intact Restrictions Weight Bearing Restrictions Per Provider Order: No     Mobility Bed Mobility Overal bed mobility: Needs Assistance Bed Mobility: Supine to Sit, Sit to Supine     Supine to sit: Min assist Sit to supine:  Min assist   General bed mobility comments: Min A to scoot hips to the edge of the stretcher and min A to bring LLE back into bed    Transfers Overall transfer level: Needs assistance Equipment used: Rolling walker (2 wheels) Transfers: Sit to/from Stand Sit to Stand: Min assist           General transfer comment: Min A to steady once standing      Balance Overall balance assessment: Needs assistance Sitting-balance support: Feet supported Sitting balance-Leahy Scale: Good     Standing balance support: Bilateral upper extremity supported, During functional activity Standing balance-Leahy Scale: Poor                             ADL either performed or assessed with clinical judgement   ADL Overall ADL's : Needs assistance/impaired Eating/Feeding: Modified independent   Grooming: Set up;Sitting   Upper Body Bathing: Set up;Sitting   Lower Body Bathing: Minimal assistance;Sit to/from stand   Upper Body Dressing : Set up;Sitting   Lower Body Dressing: Moderate assistance;Sit to/from stand   Toilet Transfer: Minimal assistance;Ambulation;Rolling walker (2 wheels)   Toileting- Clothing Manipulation and Hygiene: Contact guard assist;Sitting/lateral lean;Sit to/from stand       Functional mobility during ADLs: Minimal assistance;Rolling walker (2 wheels) General ADL Comments: pt reports he feels at his baseline functional level for ADLs. He needed assist to reach bilat feet, min A for balance and walking with RW and cues for safety. Pt needed increased time for all tasks  Vision Ability to See in Adequate Light: 1 Impaired Patient Visual Report: No change from baseline Vision Assessment?: Vision impaired- to be further tested in functional context Additional Comments: pt reports his peripherial vision was affected from the CVA     Perception Perception: Within Functional Limits       Praxis Praxis: [facility]       Pertinent Vitals/Pain Pain  Assessment Pain Assessment: Faces Faces Pain Scale: Hurts a little bit Pain Location: LLE stiff Pain Descriptors / Indicators: Discomfort Pain Intervention(s): Monitored during session, Limited activity within patient's tolerance     Extremity/Trunk Assessment Upper Extremity Assessment Upper Extremity Assessment: Defer to OT evaluation LUE Deficits / Details: hx of CVA, Limited active range but pt is able to use as a functional assist for all tasks and maintain grip on RW LUE Sensation: decreased light touch;decreased proprioception LUE Coordination: decreased fine motor;decreased gross motor   Lower Extremity Assessment Lower Extremity Assessment: LLE deficits/detail LLE Deficits / Details: hx of R MCA; pt has L foot drop while ambulating but is able to actively PF and DF. As muslce fatigues, foot drop worsens LLE Sensation: decreased light touch LLE Coordination: decreased gross motor   Cervical / Trunk Assessment Cervical / Trunk Assessment: Kyphotic   Communication Communication Communication: No apparent difficulties   Cognition Arousal: Alert Behavior During Therapy: WFL for tasks assessed/performed Cognition: No apparent impairments             OT - Cognition Comments: WFL for orientation adn command following, likely baseline                 Following commands: Intact       Cueing  General Comments   Cueing Techniques: Verbal cues  VSS    Home Living Family/patient expects to be discharged to:: Private residence Living Arrangements: Spouse/significant other Available Help at Discharge: Family;Available 24 hours/day (wife mobilizes via WC) Type of Home: House Home Access: Ramped entrance;Stairs to enter [employer] of Steps: 2 steps in/out of the man cave with grab bar Entrance Stairs-Rails: Right Home Layout: One level     Bathroom Shower/Tub: Producer, television/film/video: Handicapped height Bathroom Accessibility:  Yes   Home Equipment: Agricultural consultant (2 wheels);Grab bars - tub/shower;Grab bars - toilet;Rollator (4 wheels);Adaptive equipment;BSC/3in1;Shower seat - built in Adaptive Equipment: Reacher;Long-handled sponge Additional Comments: wife uses wc for all mobility      Prior Functioning/Environment Prior Level of Function : Needs assist             Mobility Comments: RW in the home, rollator outside. Wears L AFO for foot drop when mobilizing out of the house. Pt reports 1 recent fall ADLs Comments: mod I for ADLs, wife assist with LB ADLs and bathing intermittently    OT Problem List: Decreased strength;Decreased range of motion;Decreased activity tolerance;Impaired balance (sitting and/or standing);Pain   OT Treatment/Interventions: Self-care/ADL training;Therapeutic exercise;DME and/or AE instruction;Therapeutic activities;Balance training;Patient/family education      OT Goals(Current goals can be found in the care plan section)   Acute Rehab OT Goals Patient Stated Goal: home OT Goal Formulation: With patient Time For Goal Achievement: 09/27/23 Potential to Achieve Goals: Good ADL Goals Pt Will Perform Grooming: with modified independence Pt Will Perform Upper Body Dressing: with modified independence Pt Will Perform Lower Body Dressing: with modified independence;sit to/from stand;with adaptive equipment Pt Will Transfer to Toilet: with modified independence;ambulating Additional ADL Goal #1: Pt will navigate busy hospital environment with LRAD and no safety concerns  to demonstrate decreased risk for falls at home   OT Frequency:  Min 2X/week       AM-PAC OT 6 Clicks Daily Activity     Outcome Measure Help from another person eating meals?: None Help from another person taking care of personal grooming?: A Little Help from another person toileting, which includes using toliet, bedpan, or urinal?: A Little Help from another person bathing (including washing, rinsing,  drying)?: A Little Help from another person to put on and taking off regular upper body clothing?: A Little Help from another person to put on and taking off regular lower body clothing?: A Lot 6 Click Score: 18   End of Session Equipment Utilized During Treatment: Gait belt;Rolling walker (2 wheels) Nurse Communication: Mobility status  Activity Tolerance: Patient tolerated treatment well Patient left: in bed;with call bell/phone within reach  OT Visit Diagnosis: Unsteadiness on feet (R26.81);Other abnormalities of gait and mobility (R26.89);Repeated falls (R29.6);Muscle weakness (generalized) (M62.81);Pain;Hemiplegia and hemiparesis Hemiplegia - Right/Left: Left Hemiplegia - dominant/non-dominant: Non-Dominant Hemiplegia - caused by: Cerebral infarction                Time: 9186-9164 OT Time Calculation (min): 22 min Charges:  OT General Charges $OT Visit: 1 Visit OT Evaluation $OT Eval Moderate Complexity: 1 Mod  Lucie Kendall, OTR/L Acute Rehabilitation Services Office [phone] Secure Chat Communication Preferred   Lucie JONETTA Kendall 09/13/2023, 10:26 AM

## 2023-09-13 NOTE — Plan of Care (Signed)
   Problem: Education: Goal: Knowledge of General Education information will improve Description Including pain rating scale, medication(s)/side effects and non-pharmacologic comfort measures Outcome: Progressing   Problem: Activity: Goal: Risk for activity intolerance will decrease Outcome: Progressing   Problem: Safety: Goal: Ability to remain free from injury will improve Outcome: Progressing

## 2023-09-13 NOTE — Evaluation (Signed)
 Physical Therapy Evaluation Patient Details Name: Joseph Ortega MRN: [id number] DOB: 05-27-1955 Today's Date: 09/13/2023  History of Present Illness  Pt is a 68 y.o. M presenting to [facility] on 09/13/23 w/ several months of BLE edema. PMH includes Anxiety, Arthritis, Depression, Diabetes mellitus, Headache, HTN, Hypothyroidism, Sleep apnea, R MCA Stroke (left hemi).  Clinical Impression  Prior to admittance pt was mobilizing mod I utilizing RW for household level distances and rollator w/ L AFO donned for L foot drop for community level distances. Pt presents to evaluation with deficits in strength, power, balance, and activity tolerance, all limiting pt's ability to mobilize near baseline. Pt was able to ambulate w/ RW and no physical assistance given. Pt would benefit from further gait, and stair training. PT will continue to treat pt while he is admitted. Recommending HHPT at discharge to address remaining mobility deficits and optimize return to PLOF.        If plan is discharge home, recommend the following: A little help with walking and/or transfers;A little help with bathing/dressing/bathroom;Assist for transportation;Help with stairs or ramp for entrance   Can travel by private vehicle        Equipment Recommendations None recommended by PT  Recommendations for Other Services       Functional Status Assessment Patient has had a recent decline in their functional status and demonstrates the ability to make significant improvements in function in a reasonable and predictable amount of time.     Precautions / Restrictions Precautions Precautions: Fall Recall of Precautions/Restrictions: Intact Restrictions Weight Bearing Restrictions Per Provider Order: No      Mobility  Bed Mobility Overal bed mobility: Modified Independent             General bed mobility comments: HOB elevated and pt uses railing. Increased time and effort to complete.    Transfers Overall  transfer level: Needs assistance Equipment used: Rolling walker (2 wheels) Transfers: Sit to/from Stand Sit to Stand: Supervision           General transfer comment: STS from EOB w/ RW. Pt pushes up from bed w/ RUE and utilizies RUE to assiste LUE onto walker.    Ambulation/Gait Ambulation/Gait assistance: Supervision Gait Distance (Feet): 70 Feet Assistive device: Rolling walker (2 wheels) Gait Pattern/deviations: Step-through pattern, Decreased step length - left, Decreased stance time - left, Decreased dorsiflexion - left, Decreased weight shift to left Gait velocity: reduced Gait velocity interpretation: <1.8 ft/sec, indicate of risk for recurrent falls   General Gait Details: Pt demonstrates reciprocal gait w/ reduced step length and stance time on LLE. Pt displays L midfoot strike and L foot drop while ambulating. Pt is reliant on RW for maintaining upright.  Stairs            Wheelchair Mobility     Tilt Bed    Modified Rankin (Stroke Patients Only)       Balance Overall balance assessment: Needs assistance Sitting-balance support: No upper extremity supported, Feet supported Sitting balance-Leahy Scale: Good Sitting balance - Comments: seated EOB   Standing balance support: Bilateral upper extremity supported, During functional activity Standing balance-Leahy Scale: Poor Standing balance comment: reliant on external support                             Pertinent Vitals/Pain Pain Assessment Pain Assessment: No/denies pain Pain Intervention(s): Limited activity within patient's tolerance, Monitored during session    Home Living Family/patient expects to be  discharged to:: Private residence Living Arrangements: Spouse/significant other Available Help at Discharge: Family;Available 24 hours/day (wife mobilizes via WC) Type of Home: House Home Access: Ramped entrance;Stairs to enter Entrance Stairs-Rails: Right Entrance Stairs-Number of  Steps: 2 steps in/out of the man cave with grab bar   Home Layout: One level Home Equipment: Rolling Walker (2 wheels);Grab bars - tub/shower;Grab bars - toilet;Rollator (4 wheels);Adaptive equipment;BSC/3in1;Shower seat - built in Additional Comments: wife uses wc for all mobility    Prior Function Prior Level of Function : Needs assist             Mobility Comments: RW in the home, rollator outside. Wears L AFO for foot drop when mobilizing out of the house. Pt reports 1 recent fall ADLs Comments: mod I for ADLs, wife assist with LB ADLs and bathing intermittently     Extremity/Trunk Assessment   Upper Extremity Assessment Upper Extremity Assessment: Defer to OT evaluation LUE Deficits / Details: hx of CVA, Limited active range but pt is able to use as a functional assist for all tasks and maintain grip on RW LUE Sensation: decreased light touch;decreased proprioception LUE Coordination: decreased fine motor;decreased gross motor    Lower Extremity Assessment Lower Extremity Assessment: LLE deficits/detail LLE Deficits / Details: hx of R MCA; pt has L foot drop while ambulating but is able to actively PF and DF. As muslce fatigues, foot drop worsens LLE Sensation: decreased light touch LLE Coordination: decreased gross motor    Cervical / Trunk Assessment Cervical / Trunk Assessment: Kyphotic  Communication   Communication Communication: No apparent difficulties    Cognition Arousal: Alert Behavior During Therapy: WFL for tasks assessed/performed   PT - Cognitive impairments: No apparent impairments                         Following commands: Intact       Cueing Cueing Techniques: Verbal cues     General Comments General comments (skin integrity, edema, etc.): no signs of acute distress    Exercises     Assessment/Plan    PT Assessment Patient needs continued PT services  PT Problem List Decreased strength;Decreased activity  tolerance;Decreased balance;Decreased mobility;Decreased coordination       PT Treatment Interventions DME instruction;Gait training;Stair training;Functional mobility training;Therapeutic activities;Therapeutic exercise;Balance training;Patient/family education;Manual techniques;Wheelchair mobility training;Modalities    PT Goals (Current goals can be found in the Care Plan section)  Acute Rehab PT Goals Patient Stated Goal: to go home PT Goal Formulation: With patient Time For Goal Achievement: 09/27/23 Potential to Achieve Goals: Good    Frequency Min 1X/week     Co-evaluation               AM-PAC PT 6 Clicks Mobility  Outcome Measure Help needed turning from your back to your side while in a flat bed without using bedrails?: None Help needed moving from lying on your back to sitting on the side of a flat bed without using bedrails?: None Help needed moving to and from a bed to a chair (including a wheelchair)?: A Little Help needed standing up from a chair using your arms (e.g., wheelchair or bedside chair)?: A Little Help needed to walk in hospital room?: A Little Help needed climbing 3-5 steps with a railing? : A Little 6 Click Score: 20    End of Session Equipment Utilized During Treatment: Gait belt Activity Tolerance: Patient tolerated treatment well Patient left: in bed;with call bell/phone within reach Nurse  Communication: Mobility status PT Visit Diagnosis: Unsteadiness on feet (R26.81);Muscle weakness (generalized) (M62.81);Other symptoms and signs involving the nervous system (R29.898)    Time: 9140-9079 PT Time Calculation (min) (ACUTE ONLY): 21 min   Charges:   PT Evaluation $PT Eval Low Complexity: 1 Low   PT General Charges $$ ACUTE PT VISIT: 1 Visit         Leontine Hilt, SPT Acute Rehab [phone]   Leontine Hilt 09/13/2023, 10:34 AM

## 2023-09-13 NOTE — H&P (Addendum)
 Hospital Admission History and Physical Service Pager: [phone]  Patient name: Joseph Ortega Medical record number: [id number] Date of Birth: 1955-12-13 Age: 68 y.o. Gender: male  Primary Care Provider: Howell Lunger, DO Consultants: None Code Status: FULL   Preferred Emergency Contact:  Contact Information     Name Relation Home Work Mobile   Lever,[name] Spouse   [phone]      Other Contacts   None on File      Chief Complaint: BLE edema  Assessment and Plan: Orvis Stann is a 68 y.o. male with PMH R MCA stroke, chronic LLE lymphedema, DM 2, chronic venous insufficiency, HTN, and HLD presenting with several months of BLE pitting edema (L>R). Differential for this patient's presentation of this includes new onset CHF given peripheral edema, orthopnea, and elevated BNP.  Will obtain echocardiogram, monitor on telemetry, and consider cardiology consult pending results.  Other possible diagnoses include nephrotic syndrome given proteinuria, though this is most likely in the setting of diabetic nephropathy given uncontrolled diabetes.  Likely that chronic lymphedema is also contributing to overall picture given history of this on LLE after stroke.  Patient does have chronic venous insufficiency, which is also contributing to BLE skin changes/wounds.  Cirrhosis is less likely given lack of EtOH use or hepatic disease.  Can consider workup for DVT, however this seems less likely given bilateral symptoms and recent negative LLE DVT study.  Assessment & Plan Lower extremity edema Pitting edema to LLE > RLE with weeping sores on LLE and chronic venous insufficiency skin changes on BLE.  Highest concern for new onset CHF, last echo December 2024 showed EF 55 to 60%, severe LVH, G1DD.  Less likely acute nephrotic syndrome given significantly elevated MCR of 430 in Feb 25 iso poorly controlled DM. Can further investigate for acute renal dysfunction if echo is normal. S/p  one-time IV Lasix  40 mg with good UOP. -- Admit to FMTS, med-telemetry, Dr. Madelon attending -- Complete echocardiogram  -- Consider cardiology consult if abnormal -- Redosed IV Lasix  40 mg in AM --Tylenol  every 4 hours as needed for BLE pain -- Telemetry -- Strict I's/O's -- Daily weights --TED hose --Consider outpatient referral for LLE wounds -- Consider ABIs --Vitals per unit -- AM BMP, CBC, HA1C, lipid panel, TSH -- PT/OT eval and treat --TOC for medication assistance Abnormal EKG RBBB and first-degree AV block on ED EKG, consistent with prior EKGs.  Flat troponins (18 > 20) and no CP/SOB on exam --Repeat EKG in a.m. Chronic health problem DM 2: Takes 40U LAI daily, SSI with meals, and Trulicity  3 mg weekly.  Last A1c 11.5 in 2/25.  Ordered 20u LAI, moderate SSI.  Follow-up A1c, can consider SGLT2. HTN: Elevated pressures to 170s/80s on admission.  Home HCTZ 12.5 mg twice daily, losartan  25 mg daily Hypothyroidism: Last TSH 10.3 in 2/25 in setting of inconsistent medication use.  Checking TSH, ordered Home Synthroid  125 mcg daily HLD: Home Crestor  40 mg daily Anxiety/depression: BuSpar  10 mg 3 times daily, Cymbalta  30 mg twice daily History of CVA: ASA 81mg  daily, gabapentin  300 mg 3 times daily OSA: nightly CPAP   FEN/GI: Carb modified diet VTE Prophylaxis: Lovenox   Disposition: Med-telemetry  History of Present Illness:  Joseph Ortega is a 68 y.o. male presenting with BLE edema  LLE with swelling, erythema, pain that started several months ago.  Workup for DVT revealed positive D-dimer, negative DVT US  in March. Has not improved and now swelling, pain  in bilateral legs for 1 to 2 months (L>R). Has been taking HCTZ for diuresis for the past few days, takes at most 4 tablets daily (typically takes 12.5 mg twice daily).  Also endorses dry cough for several weeks, worse at night. Sleeps propped up on a few pillows ever since strokes several years ago.  No fevers, has not  noticed change in weight but doesn't check weight regularly.  Occasional sharp chest pain for about 1 month, lasts a few seconds and resolves.  In the ED, found to have elevated BNP to 816. Trops 18 >20.  CMP WNL. CXR negative.  Glucose 315 with glucosuria, ketonuria, proteinuria on UA.  Received IV Lasix  40 mg x 1 with 1.8L UOP.  Review Of Systems: Per HPI with the following additions: Also 1 month of diarrhea. Wife wonders if he has gallstones.  Pertinent Past Medical History: Anxiety Depression DM2- most recent A1c 11.5 in Feb 2025 HTN HLD Hypothyroidism Sleep apnea CVA with residual left-sided deficits (2022 and 2024)  Remainder reviewed in history tab.   Pertinent Past Surgical History: C-spine surgery Hernia repair  Remainder reviewed in history tab.  Pertinent Social History: Tobacco use: never Alcohol use: beer few times per week Other Substance use: none Lives with wife  Pertinent Family History: PGM, father- DM2  Remainder reviewed in history tab.   Important Outpatient Medications: Vitamin C  500 mg daily ASA 81 mg daily BuSpar  10 mg 3 times daily Carvedilol  12.5 mg twice daily Trulicity  3 mg weekly Duloxetine  30 mg twice daily Gabapentin  300 mg 3 times daily HCTZ 12.5 mg twice daily NovoLog  SSI with meals Levemir  40 units nightly Synthroid  125 mcg every morning Losartan  25 mg daily Crestor  40 mg daily Kclor 20mg  daily  Remainder reviewed in medication history.   Objective: BP (!) 169/82 (BP Location: Right Arm)   Pulse (!) 56   Temp 97.7 F (36.5 C) (Oral)   Resp 15   SpO2 98%  Exam: General: Sitting up in bed, awake and conversant, in no acute distress Eyes: No scleral icterus ENTM: Moist mucous membranes Neck: Supple Cardiovascular: RRR, normal S1/S2, no M/R/G Respiratory: CTAB, normal WOB on RA, no W/R/R Gastrointestinal: NABS, soft, nontender, nondistended, no HSM MSK: Normal tone Neuro: AAO x 3, decreased strength/sensation on left  hemibody, normal strength/sensation on right side.  Wiggles toes on right side only Psych: Pleasant mood and affect Ext: Diffuse erythema/warmth, pitting edema to BLE (just distal to knee on LLE, to mid shin on RLE).  Distal pulses difficult to palpate on LLE given edema Derm: Several 2-3cm sores on LLE with some serosanguineous drainage, do not appear infected.  Chronic skin changes consistent with venous insufficiency          Labs:  CBC BMET  Recent Labs  Lab 09/12/23 2212 09/12/23 2317  WBC 5.0  --   HGB 12.9* 13.3  HCT 38.7* 39.0  PLT 141*  --    Recent Labs  Lab 09/12/23 2212 09/12/23 2317  NA 136 138  K 4.1 4.0  CL 100  --   CO2 25  --   BUN 13  --   CREATININE 0.73  --   GLUCOSE 315*  --   CALCIUM  8.9  --      UA: > 500 glucose, 5 ketones, 30 protein  EKG: NSR with first-degree AV block and RBBB (present on past EKGs),   Imaging Studies Performed:  CXR: Low lung volumes, no active cardiopulmonary abnormality   Hindel, Leah,  MD 09/13/2023, 1:48 AM PGY-1, [facility]  FPTS Intern pager: [phone], text pages welcome Secure chat group [facility] Teaching Service

## 2023-09-13 NOTE — Plan of Care (Signed)
 FMTS Interim Progress Note  S: Patient admitted overnight.  Doing well this morning and laying comfortably in bed.  Endorses continuous leg pain, but has had good diuresis.  O: BP (!) 158/81 (BP Location: Right Arm)   Pulse (!) 58   Temp 98.2 F (36.8 C) (Oral)   Resp 20   Ht 6' (1.829 m)   Wt 103.2 kg   SpO2 97%   BMI 30.86 kg/m   General: Awake and Alert in NAD HEENT: NCAT. Sclera anicteric. No rhinorrhea. Cardiovascular: RRR. No M/R/G Respiratory: CTAB, normal WOB on RA. No wheezing, crackles, rhonchi, or diminished breath sounds. Abdomen: Soft, non-tender, non-distended. Bowel sounds normoactive Extremities: Able to move all extremities. 1+ BLE edema, no deformities or significant joint findings. Skin: Warm and dry. Chronic venous stasis wounds in LLE > RLE w/ TTP Neuro: A&Ox3. No focal neurological deficits.  A/P: LE Edema 2/2 Venous Stasis vs new-onset CHF Leg swelling likely secondary to venous stasis due to appearance.  With increased pain upon palpation we will consider further workup to rule out acute pathology. - L MRI Tib/Fib - F/u Echo - Redose IV Lasix  40 mg - AM BMP, Mag - Daily weights; strict I's/O - Continue HCTZ 25 mg - Troponin (18 > 20) - Consult cardiology s/p echo  HTN Hypertensive overnight (160s-180s/80s). - Increase losartan  to 100 mg daily and continue HCTZ 25 mg  T2DM CBGs elvated since admission.  - Increase Semglee  to 30 units daily  Hypothyroidism TSH 10.157, was not taking levothyroxine  at home as seen on home med rec. - Decreased levothyroxine  to 100 mg daily, to see how its tolerated - Recheck TSH in 6 weeks outpatient  Rest of plan and history per H&P  Janna Ferrier, DO 09/13/2023, 3:10 PM PGY-1, [facility] Family Medicine Service pager [phone]

## 2023-09-13 NOTE — Assessment & Plan Note (Addendum)
 DM 2: Takes 40U LAI daily, SSI with meals, and Trulicity  3 mg weekly.  Last A1c 11.5 in 2/25.  Ordered 20u LAI, moderate SSI.  Follow-up A1c, can consider SGLT2. HTN: Elevated pressures to 170s/80s on admission.  Home HCTZ 12.5 mg twice daily, losartan  25 mg daily Hypothyroidism: Last TSH 10.3 in 2/25 in setting of inconsistent medication use.  Checking TSH, ordered Home Synthroid  125 mcg daily HLD: Home Crestor  40 mg daily Anxiety/depression: BuSpar  10 mg 3 times daily, Cymbalta  30 mg twice daily History of CVA: ASA 81mg  daily, gabapentin  300 mg 3 times daily OSA: nightly CPAP

## 2023-09-14 ENCOUNTER — Other Ambulatory Visit: Payer: Self-pay

## 2023-09-14 DIAGNOSIS — I5020 Unspecified systolic (congestive) heart failure: Secondary | ICD-10-CM | POA: Diagnosis not present

## 2023-09-14 DIAGNOSIS — Z789 Other specified health status: Secondary | ICD-10-CM | POA: Diagnosis not present

## 2023-09-14 DIAGNOSIS — I5021 Acute systolic (congestive) heart failure: Secondary | ICD-10-CM | POA: Diagnosis not present

## 2023-09-14 DIAGNOSIS — R600 Localized edema: Secondary | ICD-10-CM | POA: Diagnosis not present

## 2023-09-14 LAB — BASIC METABOLIC PANEL WITH GFR
Anion gap: 14 (ref 5–15)
BUN: 16 mg/dL (ref 8–23)
CO2: 29 mmol/L (ref 22–32)
Calcium: 9 mg/dL (ref 8.9–10.3)
Chloride: 94 mmol/L — ABNORMAL LOW (ref 98–111)
Creatinine, Ser: 0.8 mg/dL (ref 0.61–1.24)
GFR, Estimated: >60 mL/min (ref >60)
Glucose, Bld: 244 mg/dL — ABNORMAL HIGH (ref 70–99)
Potassium: 3.2 mmol/L — ABNORMAL LOW (ref 3.5–5.1)
Sodium: 137 mmol/L (ref 135–145)

## 2023-09-14 LAB — GLUCOSE, CAPILLARY
Glucose-Capillary: 237 mg/dL — ABNORMAL HIGH (ref 70–99)
Glucose-Capillary: 283 mg/dL — ABNORMAL HIGH (ref 70–99)
Glucose-Capillary: 167 mg/dL — ABNORMAL HIGH (ref 70–99)
Glucose-Capillary: 207 mg/dL — ABNORMAL HIGH (ref 70–99)

## 2023-09-14 LAB — MAGNESIUM: Magnesium: 1.9 mg/dL (ref 1.7–2.4)

## 2023-09-14 MED ORDER — INSULIN GLARGINE-YFGN 100 UNIT/ML ~~LOC~~ SOLN
40.0000 [IU] | Freq: Every day | SUBCUTANEOUS | Status: DC
  Administered 2023-09-15 – 2023-09-16 (×2): 40 [IU] via SUBCUTANEOUS
  Filled 2023-09-14 (×2): qty 0.4

## 2023-09-14 MED ORDER — SIMETHICONE 80 MG PO CHEW
80.0000 mg | CHEWABLE_TABLET | Freq: Once | ORAL | Status: AC
  Administered 2023-09-14: 80 mg via ORAL
  Filled 2023-09-14: qty 1

## 2023-09-14 MED ORDER — POTASSIUM CHLORIDE CRYS ER 20 MEQ PO TBCR
40.0000 meq | EXTENDED_RELEASE_TABLET | Freq: Four times a day (QID) | ORAL | Status: AC
  Administered 2023-09-14 (×2): 40 meq via ORAL
  Filled 2023-09-14 (×2): qty 2

## 2023-09-14 MED ORDER — POTASSIUM CHLORIDE 10 MEQ/100ML IV SOLN
10.0000 meq | INTRAVENOUS | Status: AC
  Administered 2023-09-14 (×4): 10 meq via INTRAVENOUS
  Filled 2023-09-14 (×4): qty 100

## 2023-09-14 MED ORDER — SPIRONOLACTONE 25 MG PO TABS
25.0000 mg | ORAL_TABLET | Freq: Every day | ORAL | Status: DC
  Administered 2023-09-14 – 2023-09-16 (×3): 25 mg via ORAL
  Filled 2023-09-14 (×3): qty 1

## 2023-09-14 NOTE — Assessment & Plan Note (Signed)
 Repeat EKG was not completed, will do so today.

## 2023-09-14 NOTE — Progress Notes (Signed)
   09/14/23 2203  BiPAP/CPAP/SIPAP  $ Face Mask Medium Yes  BiPAP/CPAP/SIPAP Pt Type Adult  BiPAP/CPAP/SIPAP Resmed  Mask Type Nasal mask  Mask Size Medium  Respiratory Rate 18 breaths/min  FiO2 (%) 21 %  Patient Home Machine No  Patient Home Tubing No  Auto Titrate No  Device Plugged into RED Power Outlet Yes   Pt placed on CPAP @ 22:05.

## 2023-09-14 NOTE — Assessment & Plan Note (Addendum)
 DM 2: Takes 40U LAI daily, SSI with meals, and Trulicity  3 mg weekly.  Last A1c 11.5 in 2/25.  Increased to 40 u LAI, moderate SSI.   HTN: Increased home HCTZ to 25, further management as above Hypothyroidism: TSH elevated to 10.157, given inconsistent use reduced dose of Synthroid  to 100 mcg.  Follow-up outpatient in 4 weeks. HLD: Home Crestor  40 mg daily Anxiety/depression: BuSpar  10 mg 3 times daily, Cymbalta  30 mg twice daily History of CVA: ASA 81mg  daily, gabapentin  300 mg 3 times daily OSA: nightly CPAP

## 2023-09-14 NOTE — Plan of Care (Signed)

## 2023-09-14 NOTE — Plan of Care (Signed)
 Pt A&OX4, VSS. Pt received IV potassium as well as PO during shift. Adequate I/O. BM noted. 1 PIV in place; SL. No c/o pain. On RA. Pt 1x assist to bedside commode. Turns self. Cont tele orders renewed. Call light in hand.   Problem: Education: Goal: Ability to describe self-care measures that may prevent or decrease complications (Diabetes Survival Skills Education) will improve Outcome: Progressing Goal: Individualized Educational Video(s) Outcome: Progressing   Problem: Coping: Goal: Ability to adjust to condition or change in health will improve Outcome: Progressing   Problem: Fluid Volume: Goal: Ability to maintain a balanced intake and output will improve Outcome: Progressing   Problem: Health Behavior/Discharge Planning: Goal: Ability to identify and utilize available resources and services will improve Outcome: Progressing Goal: Ability to manage health-related needs will improve Outcome: Progressing   Problem: Metabolic: Goal: Ability to maintain appropriate glucose levels will improve Outcome: Progressing   Problem: Nutritional: Goal: Maintenance of adequate nutrition will improve Outcome: Progressing Goal: Progress toward achieving an optimal weight will improve Outcome: Progressing   Problem: Skin Integrity: Goal: Risk for impaired skin integrity will decrease Outcome: Progressing   Problem: Tissue Perfusion: Goal: Adequacy of tissue perfusion will improve Outcome: Progressing   Problem: Education: Goal: Knowledge of General Education information will improve Description: Including pain rating scale, medication(s)/side effects and non-pharmacologic comfort measures Outcome: Progressing   Problem: Health Behavior/Discharge Planning: Goal: Ability to manage health-related needs will improve Outcome: Progressing   Problem: Clinical Measurements: Goal: Ability to maintain clinical measurements within normal limits will improve Outcome: Progressing Goal:  Will remain free from infection Outcome: Progressing Goal: Diagnostic test results will improve Outcome: Progressing Goal: Respiratory complications will improve Outcome: Progressing Goal: Cardiovascular complication will be avoided Outcome: Progressing   Problem: Activity: Goal: Risk for activity intolerance will decrease Outcome: Progressing   Problem: Nutrition: Goal: Adequate nutrition will be maintained Outcome: Progressing   Problem: Coping: Goal: Level of anxiety will decrease Outcome: Progressing   Problem: Elimination: Goal: Will not experience complications related to bowel motility Outcome: Progressing Goal: Will not experience complications related to urinary retention Outcome: Progressing   Problem: Pain Managment: Goal: General experience of comfort will improve and/or be controlled Outcome: Progressing   Problem: Safety: Goal: Ability to remain free from injury will improve Outcome: Progressing   Problem: Skin Integrity: Goal: Risk for impaired skin integrity will decrease Outcome: Progressing

## 2023-09-14 NOTE — Assessment & Plan Note (Addendum)
 Echocardiogram yesterday showed decreased LV EF of 45 to 50% with regional wall motion abnormalities and severe concentric left ventricular hypertrophy.  Cardiology was consulted, will follow-up their recommendations today -Continue IV Lasix  40 mg twice daily -Continue Coreg  12.5 mg twice daily -Continue HCTZ 25 mg, Cozaar  100 mg daily-May transition to [employer] -Avoid SGLT2 inhibitor given propensity for peripheral vascular complications -Replete potassium as needed -Monitor electrolyte balance with a.m. BMP and magnesium levels -Start spironolactone 25mg  -repeat ECHO with contrast per cardiology

## 2023-09-14 NOTE — Progress Notes (Signed)
 Daily Progress Note Intern Pager: [phone]  Patient name: Joseph Ortega Medical record number: [id number] Date of birth: 02-Mar-1956 Age: 68 y.o. Gender: male  Primary Care Provider: Howell Lunger, DO Consultants: Cardiology Code Status: Full  Pt Overview and Major Events to Date:  6/27: Admitted, cardiology consulted  Assessment and Plan:  This is a 68 year old male patient admitted for lower extremity edema secondary to venous stasis versus cellulitis versus new onset CHF.  MRI tib-fib yesterday was inconclusive, will continue plan as below.  PMH includes T2DM, HTN, hypothyroid, HLD, anxiety, prior CVA, OSA. Assessment & Plan HFrEF (heart failure with reduced ejection fraction) (HCC) Echocardiogram yesterday showed decreased LV EF of 45 to 50% with regional wall motion abnormalities and severe concentric left ventricular hypertrophy.  Cardiology was consulted, will follow-up their recommendations today -Continue IV Lasix  40 mg twice daily -Continue Coreg  12.5 mg twice daily -Continue HCTZ 25 mg, Cozaar  100 mg daily-May transition to [employer] -Avoid SGLT2 inhibitor given propensity for peripheral vascular complications -Replete potassium as needed -Monitor electrolyte balance with a.m. BMP and magnesium levels -Start spironolactone 25mg  -repeat ECHO with contrast per cardiology Abnormal EKG Repeat EKG was not completed, will do so today. Chronic health problem DM 2: Takes 40U LAI daily, SSI with meals, and Trulicity  3 mg weekly.  Last A1c 11.5 in 2/25.  Increased to 40 u LAI, moderate SSI.   HTN: Increased home HCTZ to 25, further management as above Hypothyroidism: TSH elevated to 10.157, given inconsistent use reduced dose of Synthroid  to 100 mcg.  Follow-up outpatient in 4 weeks. HLD: Home Crestor  40 mg daily Anxiety/depression: BuSpar  10 mg 3 times daily, Cymbalta  30 mg twice daily History of CVA: ASA 81mg  daily, gabapentin  300 mg 3 times daily OSA: nightly  CPAP  FEN/GI: Heart healthy carb modified PPx: Lovenox  Dispo:Home pending clinical improvement .    Subjective:  Patient feeling well this morning with no complaints.  Does request that we call his wife with updates today.  Objective: Temp:  [97.9 F (36.6 C)-98.6 F (37 C)] 97.9 F (36.6 C) (06/28 0431) Pulse Rate:  [52-59] 56 (06/28 0431) Resp:  [0-22] 19 (06/28 0431) BP: (130-173)/(68-89) 161/75 (06/28 0431) SpO2:  [94 %-100 %] 100 % (06/28 0431) FiO2 (%):  [21 %] 21 % (06/27 1419) Weight:  [101.2 kg-108.9 kg] 101.2 kg (06/28 0431) Physical Exam: General: Well-appearing elderly gentleman in no distress Cardiovascular: RRR, no M/R/G Respiratory: CTAB, no increased work of breathing Abdomen: Flat, soft, nontender Extremities: 2+ peripheral pulses bilaterally.  1+ pitting edema to the midshin.  Multiple areas of full weeping or crusted over wounds.  Picture below of the largest wound with scab.  Some serosanguineous drainage noted.   Laboratory: Most recent CBC Lab Results  Component Value Date   WBC 4.7 09/13/2023   HGB 12.3 (L) 09/13/2023   HCT 37.6 (L) 09/13/2023   MCV 99.5 09/13/2023   PLT 131 (L) 09/13/2023   Most recent BMP    Latest Ref Rng & Units 09/14/2023    2:47 AM  BMP  Glucose 70 - 99 mg/dL 755   BUN 8 - 23 mg/dL 16   Creatinine 9.38 - 1.24 mg/dL 9.19   Sodium 864 - 854 mmol/L 137   Potassium 3.5 - 5.1 mmol/L 3.2   Chloride 98 - 111 mmol/L 94   CO2 22 - 32 mmol/L 29   Calcium  8.9 - 10.3 mg/dL 9.0     Cleotilde Lukes, DO 09/14/2023, 8:10 AM  PGY-1, [facility] Family Medicine FPTS Intern pager: [phone], text pages welcome Secure chat group [facility] Teaching Service

## 2023-09-14 NOTE — Discharge Instructions (Signed)
 Dear Joseph Ortega,   Thank you for letting us  participate in your care! In this section, you will find a brief hospital admission summary of why you were admitted to the hospital, what happened during your admission, your diagnosis/diagnoses, and recommended follow up.  Primary diagnosis: HFrEF, heart failure with reduced ejection fraction Treatment plan: Please continue to take all medications as prescribed during your hospital stay.  Please review them carefully as there have been a number of changes to your previous home regimen and it is important that you take these medications as directed. Secondary diagnosis: Venous stasis Treatment plan: Compression stockings will help provide relief for the chronic wounds on your legs.  Please try to keep your legs clean and dry to avoid infection.  If you have any difficulty breathing or your weight or swelling in your legs is increasing despite the fact that you are taking your water pill, please return to the emergency department.  If you have any sudden chest pain that does not go away in several minutes this can be a sign of a heart attack please also go to the emergency department if this happens.   POST-HOSPITAL & CARE INSTRUCTIONS We recommend following up with your PCP within 1 week from being discharged from the hospital. Please let PCP/Specialists know of any changes in medications that were made which you will be able to see in the medications section of this packet.  DOCTOR'S APPOINTMENTS & FOLLOW UP Future Appointments  Date Time Provider Department Center  09/25/2023 10:30 AM ACCESS TO CARE POOL [facility]  09/25/2023  2:00 PM [facility] None  09/30/2023  2:10 PM Howell Lunger, DO [facility]  10/02/2023  2:45 PM Wyn, Jackee VEAR Raddle., NP [facility]  10/21/2023  7:10 AM [facility] DEVICE REMOTES [facility]  11/25/2023  7:10 AM [facility] DEVICE REMOTES [facility]  12/19/2023 10:45 AM McCue, Harlene, NP  [facility] None  12/30/2023  7:10 AM [facility] DEVICE REMOTES [facility]  02/03/2024  7:10 AM [facility] DEVICE REMOTES [facility]  03/09/2024  7:10 AM [facility] DEVICE REMOTES [facility]  04/13/2024  7:10 AM [facility] DEVICE REMOTES [facility]     Thank you for choosing [facility]! Take care and be well!  Family Medicine Teaching Service Inpatient Team [place]  [facility]  [address] [place], [place] [address] [phone]

## 2023-09-14 NOTE — Progress Notes (Signed)
 Rounding Note   Patient Name: Joseph Ortega Date of Encounter: 09/14/2023  [facility] Cardiologist: New  Subjective No complaints  Scheduled Meds:  ascorbic acid   500 mg Oral Q supper   aspirin  EC  81 mg Oral Daily   busPIRone   10 mg Oral TID   carvedilol   12.5 mg Oral BID WC   DULoxetine   30 mg Oral BID   enoxaparin  (LOVENOX ) injection  40 mg Subcutaneous Daily   furosemide   40 mg Intravenous Q12H   gabapentin   300 mg Oral TID   hydrochlorothiazide   25 mg Oral Daily   insulin  aspart  0-15 Units Subcutaneous TID WC   insulin  glargine-yfgn  30 Units Subcutaneous Daily   levothyroxine   100 mcg Oral Q0600   losartan   100 mg Oral QHS   potassium chloride   40 mEq Oral Q6H   rosuvastatin   40 mg Oral Daily   Continuous Infusions:  potassium chloride  100 mL/hr at 09/14/23 0622   PRN Meds: acetaminophen    Vital Signs  Vitals:   09/13/23 1612 09/13/23 2056 09/14/23 0003 09/14/23 0431  BP: (!) 148/72 (!) 169/77 (!) 167/84 (!) 161/75  Pulse: (!) 57 (!) 54 (!) 52 (!) 56  Resp:  18 17 19   Temp:  98.6 F (37 C) 98.1 F (36.7 C) 97.9 F (36.6 C)  TempSrc:  Oral Oral Oral  SpO2:  94% 99% 100%  Weight:    101.2 kg  Height:        Intake/Output Summary (Last 24 hours) at 09/14/2023 0829 Last data filed at 09/14/2023 0622 Gross per 24 hour  Intake 188.89 ml  Output 4355 ml  Net -4166.11 ml      09/14/2023    4:31 AM 09/13/2023    2:19 PM 09/13/2023   10:00 AM  Last 3 Weights  Weight (lbs) 223 lb 227 lb 8.2 oz 240 lb  Weight (kg) 101.152 kg 103.2 kg 108.863 kg      Telemetry NSR - Personally Reviewed  ECG  N/a - Personally Reviewed  Physical Exam  GEN: No acute distress.   Neck: No JVD Cardiac: RRR, no murmurs, rubs, or gallops.  Respiratory: Clear to auscultation bilaterally. GI: Soft, nontender, non-distended  MS: 1+ bilateral LE edema Neuro:  Nonfocal  Psych: Normal affect   Labs High Sensitivity Troponin:   Recent Labs  Lab  09/12/23 2212 09/13/23 0145 09/13/23 0843  TROPONINIHS 18* 20* 25*     Chemistry Recent Labs  Lab 09/12/23 2212 09/12/23 2317 09/13/23 0442 09/14/23 0247  NA 136 138 138 137  K 4.1 4.0 4.3 3.2*  CL 100  --  100 94*  CO2 25  --  27 29  GLUCOSE 315*  --  296* 244*  BUN 13  --  11 16  CREATININE 0.73  --  0.75 0.80  CALCIUM  8.9  --  8.7* 9.0  MG  --   --   --  1.9  PROT 7.1  --   --   --   ALBUMIN 3.5  --   --   --   AST 27  --   --   --   ALT 18  --   --   --   ALKPHOS 123  --   --   --   BILITOT 1.3*  --   --   --   GFRNONAA >60  --  >60 >60  ANIONGAP 11  --  11 14    Lipids  Recent  Labs  Lab 09/13/23 0443  CHOL 144  TRIG 37  HDL 57  LDLCALC 80  CHOLHDL 2.5    Hematology Recent Labs  Lab 09/12/23 2212 09/12/23 2317 09/13/23 0442  WBC 5.0  --  4.7  RBC 3.94*  --  3.78*  HGB 12.9* 13.3 12.3*  HCT 38.7* 39.0 37.6*  MCV 98.2  --  99.5  MCH 32.7  --  32.5  MCHC 33.3  --  32.7  RDW 14.8  --  14.9  PLT 141*  --  131*   Thyroid  Recent Labs  Lab 09/13/23 0443  TSH 10.157*    BNP Recent Labs  Lab 09/12/23 2212  BNP 816.0*    DDimer No results for input(s): DDIMER in the last 168 hours.   Radiology  MR TIBIA FIBULA LEFT WO CONTRAST Result Date: 09/13/2023 CLINICAL DATA:  Worsening lower leg pain, left lower extremity weeping wound EXAM: MRI OF LOWER LEFT EXTREMITY WITHOUT CONTRAST TECHNIQUE: Multiplanar, multisequence MR imaging of the left tibia/fibula was performed. No intravenous contrast was administered. COMPARISON:  Left knee radiographs 02/25/2023 FINDINGS: Bones/Joint/Cartilage Small knee joint effusion. Degenerative arthropathy of the left knee. No findings of osteomyelitis or fracture. Small Baker's cyst Ligaments N/A Muscles and Tendons Infiltrative edema in the popliteal space and especially tracking between the medial head gastrocnemius and in the soleus. Plantaris muscle tear cannot be totally excluded given this appearance. Regional  muscular atrophy noted. Low-grade edema also tracks superficial to the medial head gastrocnemius. Soft tissues Diffuse subcutaneous edema in the calf with some mild posterolateral sparing. At the level of the knee, the edema is mostly lateral. The edema extends medially and to a lesser extent laterally along the ankle. No drainable abscess. IMPRESSION: 1. Diffuse subcutaneous edema in the calf with some mild posterolateral sparing. This is nonspecific and could reflect cellulitis, lymphedema, venous stasis, or fluid overload. No drainable abscess. 2. Infiltrative edema in the popliteal space and especially tracking between the medial head gastrocnemius and in the soleus. Plantaris muscle tear cannot be totally excluded given this appearance. 3. Small knee joint effusion and small Baker's cyst. 4. Degenerative arthropathy of the left knee. Electronically Signed   By: Ryan Salvage M.D.   On: 09/13/2023 13:47   ECHOCARDIOGRAM COMPLETE Result Date: 09/13/2023    ECHOCARDIOGRAM REPORT   Patient Name:   Joseph Ortega Date of Exam: 09/13/2023 Medical Rec #:  [id number]        Height:       73.0 in Accession #:    [id number]       Weight:       236.0 lb Date of Birth:  Jun 25, 1955        BSA:          2.308 m Patient Age:    68 years         BP:           162/77 mmHg Patient Gender: M                HR:           60 bpm. Exam Location:  Inpatient Procedure: 2D Echo, Cardiac Doppler and Color Doppler (Both Spectral and Color            Flow Doppler were utilized during procedure). Indications:    Edema, orthopnea,elevated BNP  History:        Patient has prior history of Echocardiogram examinations, most  recent 02/22/2023.  Sonographer:    Therisa Crouch Referring Phys: [id number] MICHAEL M BERO IMPRESSIONS  1. Left ventricular ejection fraction, by estimation, is 45 to 50%. The left ventricle has mildly decreased function. The left ventricle demonstrates regional wall motion abnormalities (see scoring  diagram/findings for description). There is severe concentric left ventricular hypertrophy. Left ventricular diastolic parameters are consistent with Grade II diastolic dysfunction (pseudonormalization).  2. Right ventricular systolic function is normal. The right ventricular size is normal.  3. Left atrial size was moderately dilated.  4. 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 The mitral valve is normal in structure. Trivial mitral valve regurgitation. No evidence of mitral stenosis.  5. The aortic valve is tricuspid. There is mild calcification of the aortic valve. Aortic valve regurgitation is not visualized. Aortic valve sclerosis/calcification is present, without any evidence of aortic stenosis.  6. The inferior vena cava is dilated in size with <50% respiratory variability, suggesting right atrial pressure of 15 mmHg.  7. Evidence of atrial level shunting detected by color flow Doppler. There is a small patent foramen ovale with predominantly left to right shunting across the atrial septum. Conclusion(s)/Recommendation(s): There is severe concentric LVH with chordal SAM. Visually LVH does not seem as prominent on this echo as one fro, 12/24 but when remeasured LVH is severe. No LVOT obstruction. Consider cardiac MRI to evalaute for hypertrophic CM if clinically indicated. FINDINGS  Left Ventricle: Left ventricular ejection fraction, by estimation, is 45 to 50%. The left ventricle has mildly decreased function. The left ventricle demonstrates regional wall motion abnormalities. The left ventricular internal cavity size was normal in size. There is severe concentric left ventricular hypertrophy. Left ventricular diastolic parameters are consistent with Grade II diastolic dysfunction (pseudonormalization).  LV Wall Scoring: The apical septal segment, apical inferior segment, and apex are akinetic. The mid inferolateral segment is hypokinetic. Right Ventricle: The right ventricular size is normal. No increase in right ventricular  wall thickness. Right ventricular systolic function is normal. Left Atrium: Left atrial size was moderately dilated. Right Atrium: Right atrial size was normal in size. Pericardium: There is no evidence of pericardial effusion. 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 The mitral valve is normal in structure. Trivial mitral valve regurgitation. No evidence of mitral valve stenosis. Tricuspid Valve: The tricuspid valve is normal in structure. Tricuspid valve regurgitation is trivial. No evidence of tricuspid stenosis. Aortic Valve: The aortic valve is tricuspid. There is mild calcification of the aortic valve. Aortic valve regurgitation is not visualized. Aortic valve sclerosis/calcification is present, without any evidence of aortic stenosis. Pulmonic Valve: The pulmonic valve was normal in structure. Pulmonic valve regurgitation is not visualized. No evidence of pulmonic stenosis. Aorta: The aortic root is normal in size and structure. Venous: The inferior vena cava is dilated in size with less than 50% respiratory variability, suggesting right atrial pressure of 15 mmHg. IAS/Shunts: Evidence of atrial level shunting detected by color flow Doppler. A small patent foramen ovale is detected with predominantly left to right shunting across the atrial septum.  LEFT VENTRICLE PLAX 2D LVIDd:         5.20 cm      Diastology LVIDs:         3.60 cm      LV e' medial:    6.06 cm/s LV PW:         1.40 cm      LV E/e' medial:  12.9 LV IVS:        1.20 cm  LV e' lateral:   6.09 cm/s LVOT diam:     2.00 cm      LV E/e' lateral: 12.9 LVOT Area:     3.14 cm  LV Volumes (MOD) LV vol d, MOD A2C: 189.0 ml LV vol d, MOD A4C: 115.0 ml LV vol s, MOD A2C: 90.2 ml LV vol s, MOD A4C: 54.8 ml LV SV MOD A2C:     98.8 ml LV SV MOD A4C:     115.0 ml LV SV MOD BP:      81.2 ml RIGHT VENTRICLE            IVC RV Basal diam:  3.50 cm    IVC diam: 2.30 cm RV S prime:     9.03 cm/s TAPSE (M-mode): 2.3 cm LEFT ATRIUM             Index LA diam:         4.70 cm 2.04 cm/m LA Vol (A2C):   92.8 ml 40.20 ml/m LA Vol (A4C):   77.9 ml 33.75 ml/m LA Biplane Vol: 86.0 ml 37.25 ml/m   AORTA Ao Root diam: 3.30 cm Ao Asc diam:  3.00 cm MITRAL VALVE MV Area (PHT): 3.30 cm    SHUNTS MV Decel Time: 230 msec    Systemic Diam: 2.00 cm MV E velocity: 78.40 cm/s MV A velocity: 73.40 cm/s MV E/A ratio:  1.07 Toribio Fuel MD Electronically signed by Toribio Fuel MD Signature Date/Time: 09/13/2023/11:32:38 AM    Final    DG Chest 2 View Result Date: 09/13/2023 CLINICAL DATA:  leg swelling EXAM: CHEST - 2 VIEW COMPARISON:  Chest x-ray 09/07/2023 FINDINGS: Wireless cardiac device overlies left chest. The heart and mediastinal contours are unchanged. Atherosclerotic plaque. Low lung volumes. No focal consolidation. No pulmonary edema. No pleural effusion. No pneumothorax. No acute osseous abnormality. Cervical spine surgical hardware. Bilateral acromioclavicular joint degenerative changes. IMPRESSION: 1. Low lung volumes with no active cardiopulmonary disease. 2.  Aortic Atherosclerosis (ICD10-I70.0). Electronically Signed   By: Morgane  Naveau M.D.   On: 09/13/2023 00:15    Cardiac Studies  Patient Profile   Joseph Ortega is a 68 y.o. male with a hx of HTN diabetes mellitus who is being seen today for the evaluation of lower extremity swelling and a reduced ejection fraction at the request of  Dr. Madelon.   Assessment & Plan   1.Acute HFmrEF - 02/2023 echo: 55-60%, severe LVH, grade I dd - 08/2023 echo (limited visualization): LVEF 45-50%, grade II dd, normal RV, dilated fixed IVC. Small PFO - CXR no acute process, BNP 816  - on IV lasix  40mg  bid, I/Os are incomplete. 4.3 L of uop documented. Standing weight down 4 lbs over 24 hrs. Renal function is stable. Continue IV diuresis - per Dr Denver note plans for outpatient ischemic testimg and evaluation for infiltrative CM - limited echo with contrast for better assessment of LVEF - medical therapy  with coreg  12.5mg  bid, losartan  100     For questions or updates, please contact [facility] Please consult [email or web address] for contact info under     Signed, Alvan Carrier, MD  09/14/2023, 8:29 AM

## 2023-09-15 ENCOUNTER — Inpatient Hospital Stay (HOSPITAL_COMMUNITY)

## 2023-09-15 DIAGNOSIS — I5022 Chronic systolic (congestive) heart failure: Secondary | ICD-10-CM | POA: Diagnosis not present

## 2023-09-15 DIAGNOSIS — R600 Localized edema: Secondary | ICD-10-CM | POA: Diagnosis not present

## 2023-09-15 DIAGNOSIS — I5021 Acute systolic (congestive) heart failure: Secondary | ICD-10-CM | POA: Diagnosis not present

## 2023-09-15 LAB — MAGNESIUM: Magnesium: 1.8 mg/dL (ref 1.7–2.4)

## 2023-09-15 LAB — BASIC METABOLIC PANEL WITH GFR
Anion gap: 11 (ref 5–15)
BUN: 18 mg/dL (ref 8–23)
CO2: 30 mmol/L (ref 22–32)
Calcium: 9 mg/dL (ref 8.9–10.3)
Chloride: 95 mmol/L — ABNORMAL LOW (ref 98–111)
Creatinine, Ser: 0.88 mg/dL (ref 0.61–1.24)
GFR, Estimated: >60 mL/min (ref >60)
Glucose, Bld: 142 mg/dL — ABNORMAL HIGH (ref 70–99)
Potassium: 3.5 mmol/L (ref 3.5–5.1)
Sodium: 136 mmol/L (ref 135–145)

## 2023-09-15 LAB — GLUCOSE, CAPILLARY
Glucose-Capillary: 178 mg/dL — ABNORMAL HIGH (ref 70–99)
Glucose-Capillary: 181 mg/dL — ABNORMAL HIGH (ref 70–99)
Glucose-Capillary: 150 mg/dL — ABNORMAL HIGH (ref 70–99)
Glucose-Capillary: 111 mg/dL — ABNORMAL HIGH (ref 70–99)

## 2023-09-15 MED ORDER — MAGNESIUM SULFATE IN D5W 1-5 GM/100ML-% IV SOLN
1.0000 g | Freq: Once | INTRAVENOUS | Status: AC
  Administered 2023-09-15: 1 g via INTRAVENOUS
  Filled 2023-09-15: qty 100

## 2023-09-15 MED ORDER — POTASSIUM CHLORIDE CRYS ER 20 MEQ PO TBCR
40.0000 meq | EXTENDED_RELEASE_TABLET | Freq: Four times a day (QID) | ORAL | Status: AC
  Administered 2023-09-15 (×2): 40 meq via ORAL
  Filled 2023-09-15 (×2): qty 2

## 2023-09-15 NOTE — Assessment & Plan Note (Signed)
 Repeated yesterday; NSR, RBBB and T wave inversions noted in the lateral leads.

## 2023-09-15 NOTE — Progress Notes (Signed)
   09/15/23 2149  BiPAP/CPAP/SIPAP  $ Non-Invasive Home Ventilator  Subsequent  BiPAP/CPAP/SIPAP Pt Type Adult  BiPAP/CPAP/SIPAP Resmed  Mask Type Nasal mask  Mask Size Medium  Respiratory Rate 18 breaths/min  PEEP 10 cmH20  FiO2 (%) 21 %  Patient Home Machine No  Patient Home Mask No  Patient Home Tubing No  Auto Titrate No  Device Plugged into RED Power Outlet Yes

## 2023-09-15 NOTE — Plan of Care (Signed)
 Called patient's wife to provide update.  She was very grateful and understanding of the plan.  Had some questions regarding his CPAP and potentially getting a repeat sleep study, discussed that this would likely need to be done outpatient.  Also inquired about getting an SLP evaluation due to some history of difficulty swallowing over the past few weeks at home.  She reports she has noted hearing some choking noises.  Will consult SLP.  Kathrine Melena, DO 09/15/23 1:36 PM

## 2023-09-15 NOTE — Assessment & Plan Note (Addendum)
 T2DM: Takes 40U LAI daily, SSI with meals, and Trulicity  3 mg weekly.  Last A1c 11.5 in 2/25.  CBGs improved this AM, continue 40u daily and moderate SSI.   HTN: Management as above Hypothyroidism: TSH elevated to 10.157, reduced dose of Synthroid  to 100 mcg.  Follow-up outpatient in 4 weeks. HLD: Home Crestor  40 mg daily Anxiety/depression: BuSpar  10 mg TID, Cymbalta  30 mg BID History of CVA: ASA 81mg  daily, gabapentin  300 mg TID OSA: nightly CPAP

## 2023-09-15 NOTE — Progress Notes (Signed)
 Rounding Note   Patient Name: Joseph Ortega Date of Encounter: 09/15/2023  [facility] Cardiologist: None   Subjective No complaints  Scheduled Meds:  ascorbic acid   500 mg Oral Q supper   aspirin  EC  81 mg Oral Daily   busPIRone   10 mg Oral TID   carvedilol   12.5 mg Oral BID WC   DULoxetine   30 mg Oral BID   enoxaparin  (LOVENOX ) injection  40 mg Subcutaneous Daily   furosemide   40 mg Intravenous Q12H   gabapentin   300 mg Oral TID   hydrochlorothiazide   25 mg Oral Daily   insulin  aspart  0-15 Units Subcutaneous TID WC   insulin  glargine-yfgn  40 Units Subcutaneous Daily   levothyroxine   100 mcg Oral Q0600   losartan   100 mg Oral QHS   potassium chloride   40 mEq Oral Q6H   rosuvastatin   40 mg Oral Daily   spironolactone  25 mg Oral Daily   Continuous Infusions:  magnesium sulfate bolus IVPB 1 g (09/15/23 0647)   PRN Meds: acetaminophen    Vital Signs  Vitals:   09/14/23 1655 09/14/23 1953 09/14/23 2352 09/15/23 0500  BP: (!) 150/73 133/66 (!) 151/77 130/74  Pulse: 62 (!) 57 (!) 56 (!) 54  Resp: 20 19 18 19   Temp: 97.8 F (36.6 C) 97.9 F (36.6 C) 97.6 F (36.4 C) 97.9 F (36.6 C)  TempSrc: Oral Oral Oral Oral  SpO2: 97% 97% 98% 100%  Weight:    99.4 kg  Height:        Intake/Output Summary (Last 24 hours) at 09/15/2023 0715 Last data filed at 09/15/2023 0600 Gross per 24 hour  Intake 800 ml  Output 4025 ml  Net -3225 ml      09/15/2023    5:00 AM 09/14/2023    4:31 AM 09/13/2023    2:19 PM  Last 3 Weights  Weight (lbs) 219 lb 3.2 oz 223 lb 227 lb 8.2 oz  Weight (kg) 99.428 kg 101.152 kg 103.2 kg      Telemetry NSR - Personally Reviewed  ECG  N/a - Personally Reviewed  Physical Exam  GEN: No acute distress.   Neck: No JVD Cardiac: RRR, no murmurs, rubs, or gallops.  Respiratory: Clear to auscultation bilaterally. GI: Soft, nontender, non-distended  MS: trace bilateral edema Neuro:  Nonfocal  Psych: Normal affect   Labs High  Sensitivity Troponin:   Recent Labs  Lab 09/12/23 2212 09/13/23 0145 09/13/23 0843  TROPONINIHS 18* 20* 25*     Chemistry Recent Labs  Lab 09/12/23 2212 09/12/23 2317 09/13/23 0442 09/14/23 0247 09/15/23 0344  NA 136   < > 138 137 136  K 4.1   < > 4.3 3.2* 3.5  CL 100  --  100 94* 95*  CO2 25  --  27 29 30   GLUCOSE 315*  --  296* 244* 142*  BUN 13  --  11 16 18   CREATININE 0.73  --  0.75 0.80 0.88  CALCIUM  8.9  --  8.7* 9.0 9.0  MG  --   --   --  1.9 1.8  PROT 7.1  --   --   --   --   ALBUMIN 3.5  --   --   --   --   AST 27  --   --   --   --   ALT 18  --   --   --   --   ALKPHOS 123  --   --   --   --  BILITOT 1.3*  --   --   --   --   GFRNONAA >60  --  >60 >60 >60  ANIONGAP 11  --  11 14 11    < > = values in this interval not displayed.    Lipids  Recent Labs  Lab 09/13/23 0443  CHOL 144  TRIG 37  HDL 57  LDLCALC 80  CHOLHDL 2.5    Hematology Recent Labs  Lab 09/12/23 2212 09/12/23 2317 09/13/23 0442  WBC 5.0  --  4.7  RBC 3.94*  --  3.78*  HGB 12.9* 13.3 12.3*  HCT 38.7* 39.0 37.6*  MCV 98.2  --  99.5  MCH 32.7  --  32.5  MCHC 33.3  --  32.7  RDW 14.8  --  14.9  PLT 141*  --  131*   Thyroid  Recent Labs  Lab 09/13/23 0443  TSH 10.157*    BNP Recent Labs  Lab 09/12/23 2212  BNP 816.0*    DDimer No results for input(s): DDIMER in the last 168 hours.   Radiology  MR TIBIA FIBULA LEFT WO CONTRAST Result Date: 09/13/2023 CLINICAL DATA:  Worsening lower leg pain, left lower extremity weeping wound EXAM: MRI OF LOWER LEFT EXTREMITY WITHOUT CONTRAST TECHNIQUE: Multiplanar, multisequence MR imaging of the left tibia/fibula was performed. No intravenous contrast was administered. COMPARISON:  Left knee radiographs 02/25/2023 FINDINGS: Bones/Joint/Cartilage Small knee joint effusion. Degenerative arthropathy of the left knee. No findings of osteomyelitis or fracture. Small Baker's cyst Ligaments N/A Muscles and Tendons Infiltrative edema in the  popliteal space and especially tracking between the medial head gastrocnemius and in the soleus. Plantaris muscle tear cannot be totally excluded given this appearance. Regional muscular atrophy noted. Low-grade edema also tracks superficial to the medial head gastrocnemius. Soft tissues Diffuse subcutaneous edema in the calf with some mild posterolateral sparing. At the level of the knee, the edema is mostly lateral. The edema extends medially and to a lesser extent laterally along the ankle. No drainable abscess. IMPRESSION: 1. Diffuse subcutaneous edema in the calf with some mild posterolateral sparing. This is nonspecific and could reflect cellulitis, lymphedema, venous stasis, or fluid overload. No drainable abscess. 2. Infiltrative edema in the popliteal space and especially tracking between the medial head gastrocnemius and in the soleus. Plantaris muscle tear cannot be totally excluded given this appearance. 3. Small knee joint effusion and small Baker's cyst. 4. Degenerative arthropathy of the left knee. Electronically Signed   By: Ryan Salvage M.D.   On: 09/13/2023 13:47   ECHOCARDIOGRAM COMPLETE Result Date: 09/13/2023    ECHOCARDIOGRAM REPORT   Patient Name:   Joseph Ortega Date of Exam: 09/13/2023 Medical Rec #:  [id number]        Height:       73.0 in Accession #:    [id number]       Weight:       236.0 lb Date of Birth:  May 29, 1955        BSA:          2.308 m Patient Age:    68 years         BP:           162/77 mmHg Patient Gender: M                HR:           60 bpm. Exam Location:  Inpatient Procedure: 2D Echo, Cardiac Doppler and Color Doppler (Both Spectral and Color  Flow Doppler were utilized during procedure). Indications:    Edema, orthopnea,elevated BNP  History:        Patient has prior history of Echocardiogram examinations, most                 recent 02/22/2023.  Sonographer:    Therisa Crouch Referring Phys: [id number] MICHAEL M BERO IMPRESSIONS  1. Left ventricular  ejection fraction, by estimation, is 45 to 50%. The left ventricle has mildly decreased function. The left ventricle demonstrates regional wall motion abnormalities (see scoring diagram/findings for description). There is severe concentric left ventricular hypertrophy. Left ventricular diastolic parameters are consistent with Grade II diastolic dysfunction (pseudonormalization).  2. Right ventricular systolic function is normal. The right ventricular size is normal.  3. Left atrial size was moderately dilated.  4. 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 The mitral valve is normal in structure. Trivial mitral valve regurgitation. No evidence of mitral stenosis.  5. The aortic valve is tricuspid. There is mild calcification of the aortic valve. Aortic valve regurgitation is not visualized. Aortic valve sclerosis/calcification is present, without any evidence of aortic stenosis.  6. The inferior vena cava is dilated in size with <50% respiratory variability, suggesting right atrial pressure of 15 mmHg.  7. Evidence of atrial level shunting detected by color flow Doppler. There is a small patent foramen ovale with predominantly left to right shunting across the atrial septum. Conclusion(s)/Recommendation(s): There is severe concentric LVH with chordal SAM. Visually LVH does not seem as prominent on this echo as one fro, 12/24 but when remeasured LVH is severe. No LVOT obstruction. Consider cardiac MRI to evalaute for hypertrophic CM if clinically indicated. FINDINGS  Left Ventricle: Left ventricular ejection fraction, by estimation, is 45 to 50%. The left ventricle has mildly decreased function. The left ventricle demonstrates regional wall motion abnormalities. The left ventricular internal cavity size was normal in size. There is severe concentric left ventricular hypertrophy. Left ventricular diastolic parameters are consistent with Grade II diastolic dysfunction (pseudonormalization).  LV Wall Scoring: The apical septal segment,  apical inferior segment, and apex are akinetic. The mid inferolateral segment is hypokinetic. Right Ventricle: The right ventricular size is normal. No increase in right ventricular wall thickness. Right ventricular systolic function is normal. Left Atrium: Left atrial size was moderately dilated. Right Atrium: Right atrial size was normal in size. Pericardium: There is no evidence of pericardial effusion. 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 The mitral valve is normal in structure. Trivial mitral valve regurgitation. No evidence of mitral valve stenosis. Tricuspid Valve: The tricuspid valve is normal in structure. Tricuspid valve regurgitation is trivial. No evidence of tricuspid stenosis. Aortic Valve: The aortic valve is tricuspid. There is mild calcification of the aortic valve. Aortic valve regurgitation is not visualized. Aortic valve sclerosis/calcification is present, without any evidence of aortic stenosis. Pulmonic Valve: The pulmonic valve was normal in structure. Pulmonic valve regurgitation is not visualized. No evidence of pulmonic stenosis. Aorta: The aortic root is normal in size and structure. Venous: The inferior vena cava is dilated in size with less than 50% respiratory variability, suggesting right atrial pressure of 15 mmHg. IAS/Shunts: Evidence of atrial level shunting detected by color flow Doppler. A small patent foramen ovale is detected with predominantly left to right shunting across the atrial septum.  LEFT VENTRICLE PLAX 2D LVIDd:         5.20 cm      Diastology LVIDs:         3.60 cm  LV e' medial:    6.06 cm/s LV PW:         1.40 cm      LV E/e' medial:  12.9 LV IVS:        1.20 cm      LV e' lateral:   6.09 cm/s LVOT diam:     2.00 cm      LV E/e' lateral: 12.9 LVOT Area:     3.14 cm  LV Volumes (MOD) LV vol d, MOD A2C: 189.0 ml LV vol d, MOD A4C: 115.0 ml LV vol s, MOD A2C: 90.2 ml LV vol s, MOD A4C: 54.8 ml LV SV MOD A2C:     98.8 ml LV SV MOD A4C:     115.0 ml LV SV MOD BP:       81.2 ml RIGHT VENTRICLE            IVC RV Basal diam:  3.50 cm    IVC diam: 2.30 cm RV S prime:     9.03 cm/s TAPSE (M-mode): 2.3 cm LEFT ATRIUM             Index LA diam:        4.70 cm 2.04 cm/m LA Vol (A2C):   92.8 ml 40.20 ml/m LA Vol (A4C):   77.9 ml 33.75 ml/m LA Biplane Vol: 86.0 ml 37.25 ml/m   AORTA Ao Root diam: 3.30 cm Ao Asc diam:  3.00 cm MITRAL VALVE MV Area (PHT): 3.30 cm    SHUNTS MV Decel Time: 230 msec    Systemic Diam: 2.00 cm MV E velocity: 78.40 cm/s MV A velocity: 73.40 cm/s MV E/A ratio:  1.07 Toribio Fuel MD Electronically signed by Toribio Fuel MD Signature Date/Time: 09/13/2023/11:32:38 AM    Final      Patient Profile   Joseph Ortega is a 68 y.o. male with a hx of HTN diabetes mellitus who is being seen today for the evaluation of lower extremity swelling and a reduced ejection fraction at the request of Dr. Madelon.   Assessment & Plan  1.Acute HFmrEF - 02/2023 echo: 55-60%, severe LVH, grade I dd - 08/2023 echo (limited visualization): LVEF 45-50%, grade II dd, normal RV, dilated fixed IVC. Small PFO - limited echo with contrast pending.  - CXR no acute process, BNP 816   - on IV lasix  40mg  bid, negative 3 L yesterday,  I/Os are incomplete for total admission. . 4.3 L of uop documented. Standing weight down 4 lbs over 24 hrs. Renal function is stable. - per Dr Denver note plans for outpatient ischemic testimg and evaluation for infiltrative CM - limited echo with contrast for better assessment of LVEF pending. If low LVEF confirmed with contrast adjust HF regimen further.  - medical therapy with coreg  12.5mg  bid, losartan  100, aldactone 25mg  daily.   -nearing euvolemia, I think likely one more day of IV diuretic and then transition to oral tomorrow.    For questions or updates, please contact [facility] Please consult [email or web address] for contact info under     Signed, Alvan Carrier, MD  09/15/2023, 7:15 AM

## 2023-09-15 NOTE — Plan of Care (Signed)

## 2023-09-15 NOTE — Progress Notes (Signed)
 Daily Progress Note Intern Pager: [phone]  Patient name: Joseph Ortega Medical record number: [id number] Date of birth: 21-Sep-1955 Age: 68 y.o. Gender: male  Primary Care Provider: Howell Lunger, DO Consultants: Cardiology Code Status: Full Code  Pt Overview and Major Events to Date:  6/27: Admitted  Assessment and Plan: Joseph Ortega is a 68 y.o. male with PMH of T2DM, HTN, hypothyroidism, HLD, anxiety, prior CVA, and OSA admitted for LE edema likely secondary to venous stasis vs HFmrEF.  Cardiology consulted for further recommendations with clinical symptoms, worsening echo, and abnormal EKG. Assessment & Plan Heart failure with mildly reduced ejection fraction (HFmrEF) (HCC) Echo demonstrated EF of 45 - 50% with RWMA and severe concentric LVH.  Cardiology was consulted, and recommended repeat echo with contrast.  Output 4L yesterday and weight down 4 lbs. - IV Lasix  40 mg BID, approaching euvolemia, likely transition to oral tomorrow - Spironolactone 25 mg daily - Continue home Coreg  12.5 mg BID, HCTZ 25 mg daily (adjusted home reg), Losartan  100 mg daily (inc from home dose) - Avoid SGLT2i given propensity for peripheral vascular complications per cards - Monitor electrolytes w/ AM BMP & Mag, supplemented K and Mag today - Echo with contrast per cardiology pending - Augment GDMT as able - Cardiology consulted, appreciate recommendations Abnormal EKG Repeated yesterday; NSR, RBBB and T wave inversions noted in the lateral leads.  Chronic health problem T2DM: Takes 40U LAI daily, SSI with meals, and Trulicity  3 mg weekly.  Last A1c 11.5 in 2/25.  CBGs improved this AM, continue 40u daily and moderate SSI.   HTN: Management as above Hypothyroidism: TSH elevated to 10.157, reduced dose of Synthroid  to 100 mcg.  Follow-up outpatient in 4 weeks. HLD: Home Crestor  40 mg daily Anxiety/depression: BuSpar  10 mg TID, Cymbalta  30 mg BID History of CVA: ASA 81mg  daily,  gabapentin  300 mg TID OSA: nightly CPAP  FEN/GI: Heart healthy/carb modified PPx: Lovenox  Dispo: Pending echo, cardiology recs, and oral diuresis  Subjective:  Patient is doing well this morning and has no concerns.  He feels like he is symptomatically improving.  Was inquiring if it would be possible for him to get repeat sleep study while inpatient due to changes in his weight and likely needing changes to his CPAP setting, will look into this.  Objective: Temp:  [97.6 F (36.4 C)-97.9 F (36.6 C)] 97.9 F (36.6 C) (06/29 1053) Pulse Rate:  [54-62] 55 (06/29 1053) Resp:  [18-20] 20 (06/29 1053) BP: (107-151)/(55-77) 107/55 (06/29 1053) SpO2:  [95 %-100 %] 95 % (06/29 1053) FiO2 (%):  [21 %] 21 % (06/28 2203) Weight:  [99.4 kg] 99.4 kg (06/29 0500) Physical Exam: General: Awake and Alert in NAD HEENT: NCAT. Sclera anicteric. No rhinorrhea. Cardiovascular: RRR. No M/R/G Respiratory: CTAB, normal WOB on RA. No wheezing, crackles, rhonchi, or diminished breath sounds. Abdomen: Soft, non-tender, non-distended. Bowel sounds normoactive Extremities: Able to move all extremities. No BLE edema, no deformities or significant joint findings. Skin: Warm and dry. Chronic venous stasis wounds in LLE > RLE w/ TTP over largest wound, minimal drainage Neuro: A&Ox3. No focal neurological deficits.  Laboratory: Most recent CBC Lab Results  Component Value Date   WBC 4.7 09/13/2023   HGB 12.3 (L) 09/13/2023   HCT 37.6 (L) 09/13/2023   MCV 99.5 09/13/2023   PLT 131 (L) 09/13/2023   Most recent BMP    Latest Ref Rng & Units 09/15/2023    3:44 AM  BMP  Glucose 70 - 99 mg/dL 857   BUN 8 - 23 mg/dL 18   Creatinine 9.38 - 1.24 mg/dL 9.11   Sodium 864 - 854 mmol/L 136   Potassium 3.5 - 5.1 mmol/L 3.5   Chloride 98 - 111 mmol/L 95   CO2 22 - 32 mmol/L 30   Calcium  8.9 - 10.3 mg/dL 9.0    Mag: 1.8 > supplemented K > supplemented  Imaging/Diagnostic Tests: No new imaging. Joseph Ferrier, DO 09/15/2023, 11:25 AM  PGY-1, [facility] FPTS Intern pager: [phone], text pages welcome Secure chat group [facility] Teaching Service

## 2023-09-15 NOTE — Progress Notes (Signed)
2D echo attempted, patient on bedside. Will try later

## 2023-09-15 NOTE — Assessment & Plan Note (Addendum)
 Echo demonstrated EF of 45 - 50% with RWMA and severe concentric LVH.  Cardiology was consulted, and recommended repeat echo with contrast.  Output 4L yesterday and weight down 4 lbs. - IV Lasix  40 mg BID, approaching euvolemia, likely transition to oral tomorrow - Spironolactone 25 mg daily - Continue home Coreg  12.5 mg BID, HCTZ 25 mg daily (adjusted home reg), Losartan  100 mg daily (inc from home dose) - Avoid SGLT2i given propensity for peripheral vascular complications per cards - Monitor electrolytes w/ AM BMP & Mag, supplemented K and Mag today - Echo with contrast per cardiology pending - Augment GDMT as able - Cardiology consulted, appreciate recommendations

## 2023-09-15 NOTE — Evaluation (Signed)
 Clinical/Bedside Swallow Evaluation Patient Details  Name: Joseph Ortega MRN: [id number] Date of Birth: 07-03-1955  Today's Date: 09/15/2023 Time: SLP Start Time (ACUTE ONLY): 1350 SLP Stop Time (ACUTE ONLY): 1404 SLP Time Calculation (min) (ACUTE ONLY): 14 min  Past Medical History:  Past Medical History:  Diagnosis Date   Anxiety    Arthritis    Depression    Diabetes mellitus without complication (HCC)    Hypertensive crisis 02/22/2023   Hypothyroidism    Sleep apnea    wears CPAP   Stroke (HCC)    1 stroke 2/24, and 2 strokes on 03/09/21   Thyroid disease    Past Surgical History:  Past Surgical History:  Procedure Laterality Date   ANTERIOR CERVICAL DECOMP/DISCECTOMY FUSION N/A 11/23/2022   Procedure: Anterior Cervical Decompression Fusion  Cervical four-five;  Surgeon: Lanis Pupa, MD;  Location: [facility] OR;  Service: Neurosurgery;  Laterality: N/A;   GROIN DEBRIDEMENT Left    ingrown hair, possibly abscesses, surgery to treat infection   HERNIA REPAIR Left    inguinal   IR ANGIO INTRA EXTRACRAN SEL COM CAROTID INNOMINATE BILAT MOD SED  03/09/2021   IR ANGIO VERTEBRAL SEL VERTEBRAL BILAT MOD SED  03/09/2021   IR US  GUIDE VASC ACCESS RIGHT  03/09/2021   LOOP RECORDER INSERTION N/A 05/07/2022   Procedure: LOOP RECORDER INSERTION;  Surgeon: Cindie Ole DASEN, MD;  Location: [facility] INVASIVE CV LAB;  Service: Cardiovascular;  Laterality: N/A;   HPI:  Patient is a 68 y.o. male with PMH: right MCA CVA (completed CIR in 2024) DM-2, HTN, HLD, chronic LLE lymphedema, OSA on CPAP, GERD. He presented to the hospital on 09/13/2023 with several months of BLE pitting edema. Per MD note, highest concern is for new onset CHF. SLP swallow evaluation ordered on 6/28  due to wife's concerns of patient with difficulty swallowing over past few weeks.    Assessment / Plan / Recommendation  Clinical Impression  Patient is not currently presenting with clinical s/s of dysphagia as per this  bedside swallow evaluation. Prior to entering room, SLP reviewed patient's medical chart with notable findings being his h/o GERD,  ACDF of C4-5 in 2024 and CVA in 2024. While he was in CIR for right MCA CVA, SLP did evaluate his swallowing and reported that his swallow function appeared normal. During today's evaluation, patient's swallow assessed via consecutive straw sips of thin liquids (water). Swallow initiation was timely and no overt s/s aspiration observed. Patient did indicate that he will cough when drinking liquids every once in a while and he report sensation of pills and some solid foods getting caught at a particular place in his throat. He denied having had any dysphagia following 2024 ACDF. SLP recommending to continue current PO diet and no further skilled intervention warranted.   SLP does recommend that patient's GERD continues to be managed and that if he and/or his spouse feel that his swallow has declined, to seek out a referral for OP MBS with SLP.   SLP Visit Diagnosis: Dysphagia, unspecified (R13.10)    Aspiration Risk  No limitations    Diet Recommendation Regular;Thin liquid    Liquid Administration via: Cup;Straw Medication Administration: Whole meds with liquid Supervision: Patient able to self feed Compensations: Slow rate;Small sips/bites Postural Changes: Seated upright at 90 degrees;Remain upright for at least 30 minutes after po intake    Other  Recommendations Oral Care Recommendations: Oral care BID     Assistance Recommended at Discharge  N/A  Functional  Status Assessment  N/A  Frequency and Duration     N/A       Prognosis        Swallow Study   General Date of Onset: 09/15/23 HPI: Patient is a 68 y.o. male with PMH: right MCA CVA (completed CIR in 2024) DM-2, HTN, HLD, chronic LLE lymphedema, OSA on CPAP, GERD. He presented to the hospital on 09/13/2023 with several months of BLE pitting edema. Per MD note, highest concern is for new onset  CHF. SLP swallow evaluation ordered on 6/28  due to wife's concerns of patient with difficulty swallowing over past few weeks. Type of Study: Bedside Swallow Evaluation Previous Swallow Assessment: during CIR admission Diet Prior to this Study: Regular;Thin liquids (Level 0) Temperature Spikes Noted: No Respiratory Status: Room air History of Recent Intubation: No Behavior/Cognition: Alert;Cooperative;Pleasant mood Oral Cavity Assessment: Within Functional Limits Oral Care Completed by SLP: No Oral Cavity - Dentition: Adequate natural dentition Vision: Functional for self-feeding Self-Feeding Abilities: Able to feed self Patient Positioning: Upright in bed Baseline Vocal Quality: Normal Volitional Cough: Strong Volitional Swallow: Able to elicit    Oral/Motor/Sensory Function Overall Oral Motor/Sensory Function: Within functional limits   Ice Chips     Thin Liquid Thin Liquid: Within functional limits Presentation: Straw;Self Fed    Nectar Thick     Honey Thick     Puree Puree: Not tested   Solid     Solid: Not tested     Joseph IVAR Blase, MA, CCC-SLP Speech Therapy

## 2023-09-16 ENCOUNTER — Inpatient Hospital Stay (HOSPITAL_COMMUNITY)

## 2023-09-16 ENCOUNTER — Ambulatory Visit: Admitting: Student

## 2023-09-16 DIAGNOSIS — R931 Abnormal findings on diagnostic imaging of heart and coronary circulation: Secondary | ICD-10-CM | POA: Diagnosis not present

## 2023-09-16 DIAGNOSIS — R0602 Shortness of breath: Secondary | ICD-10-CM | POA: Diagnosis not present

## 2023-09-16 DIAGNOSIS — I5022 Chronic systolic (congestive) heart failure: Secondary | ICD-10-CM | POA: Diagnosis not present

## 2023-09-16 DIAGNOSIS — R600 Localized edema: Secondary | ICD-10-CM | POA: Diagnosis not present

## 2023-09-16 DIAGNOSIS — I5021 Acute systolic (congestive) heart failure: Secondary | ICD-10-CM

## 2023-09-16 LAB — GLUCOSE, CAPILLARY
Glucose-Capillary: 91 mg/dL (ref 70–99)
Glucose-Capillary: 246 mg/dL — ABNORMAL HIGH (ref 70–99)
Glucose-Capillary: 137 mg/dL — ABNORMAL HIGH (ref 70–99)
Glucose-Capillary: 248 mg/dL — ABNORMAL HIGH (ref 70–99)

## 2023-09-16 LAB — BASIC METABOLIC PANEL WITH GFR
Anion gap: 10 (ref 5–15)
BUN: 20 mg/dL (ref 8–23)
CO2: 31 mmol/L (ref 22–32)
Calcium: 9.2 mg/dL (ref 8.9–10.3)
Chloride: 98 mmol/L (ref 98–111)
Creatinine, Ser: 0.99 mg/dL (ref 0.61–1.24)
GFR, Estimated: >60 mL/min (ref >60)
Glucose, Bld: 82 mg/dL (ref 70–99)
Potassium: 4.2 mmol/L (ref 3.5–5.1)
Sodium: 139 mmol/L (ref 135–145)

## 2023-09-16 LAB — MAGNESIUM: Magnesium: 2.1 mg/dL (ref 1.7–2.4)

## 2023-09-16 LAB — CBC
HCT: 46.8 % (ref 39.0–52.0)
Hemoglobin: 15.9 g/dL (ref 13.0–17.0)
MCH: 32.4 pg (ref 26.0–34.0)
MCHC: 34 g/dL (ref 30.0–36.0)
MCV: 95.5 fL (ref 80.0–100.0)
Platelets: 190 10*3/uL (ref 150–400)
RBC: 4.9 MIL/uL (ref 4.22–5.81)
RDW: 14.6 % (ref 11.5–15.5)
WBC: 7.4 10*3/uL (ref 4.0–10.5)
nRBC: 0 % (ref 0.0–0.2)

## 2023-09-16 LAB — ECHOCARDIOGRAM LIMITED
Area-P 1/2: 2.99 cm2
Calc EF: 36.1 %
Height: 72 in
S' Lateral: 2.5 cm
Single Plane A2C EF: 30.3 %
Single Plane A4C EF: 37.2 %
Weight: 3507.2 [oz_av]

## 2023-09-16 MED ORDER — EMPAGLIFLOZIN 10 MG PO TABS
10.0000 mg | ORAL_TABLET | Freq: Every day | ORAL | Status: DC
  Administered 2023-09-16: 10 mg via ORAL
  Filled 2023-09-16: qty 1

## 2023-09-16 MED ORDER — INSULIN GLARGINE-YFGN 100 UNIT/ML ~~LOC~~ SOLN
35.0000 [IU] | Freq: Every day | SUBCUTANEOUS | Status: DC
  Administered 2023-09-17 – 2023-09-19 (×3): 35 [IU] via SUBCUTANEOUS
  Filled 2023-09-16 (×3): qty 0.35

## 2023-09-16 MED ORDER — FAMOTIDINE 20 MG PO TABS
20.0000 mg | ORAL_TABLET | Freq: Every day | ORAL | Status: DC
  Administered 2023-09-16 – 2023-09-19 (×4): 20 mg via ORAL
  Filled 2023-09-16 (×4): qty 1

## 2023-09-16 MED ORDER — SIMETHICONE 80 MG PO CHEW
80.0000 mg | CHEWABLE_TABLET | Freq: Four times a day (QID) | ORAL | Status: DC | PRN
  Administered 2023-09-16 – 2023-09-17 (×2): 80 mg via ORAL
  Filled 2023-09-16 (×2): qty 1

## 2023-09-16 MED ORDER — ASPIRIN 81 MG PO TBEC
81.0000 mg | DELAYED_RELEASE_TABLET | Freq: Every day | ORAL | Status: DC
  Administered 2023-09-19: 81 mg via ORAL
  Filled 2023-09-16: qty 1

## 2023-09-16 MED ORDER — FUROSEMIDE 40 MG PO TABS: 80.0000 mg | ORAL_TABLET | Freq: Two times a day (BID) | ORAL | Status: DC

## 2023-09-16 MED ORDER — INSULIN ASPART 100 UNIT/ML IJ SOLN
5.0000 [IU] | Freq: Once | INTRAMUSCULAR | Status: AC
  Administered 2023-09-16: 5 [IU] via SUBCUTANEOUS

## 2023-09-16 MED ORDER — HEPARIN BOLUS VIA INFUSION
4000.0000 [IU] | Freq: Once | INTRAVENOUS | Status: AC
  Administered 2023-09-16: 4000 [IU] via INTRAVENOUS
  Filled 2023-09-16: qty 4000

## 2023-09-16 MED ORDER — TORSEMIDE 20 MG PO TABS: 40.0000 mg | ORAL_TABLET | Freq: Every day | ORAL | Status: DC

## 2023-09-16 MED ORDER — SODIUM CHLORIDE 0.9 % IV SOLN: INTRAVENOUS | Status: AC

## 2023-09-16 MED ORDER — TORSEMIDE 20 MG PO TABS: 20.0000 mg | ORAL_TABLET | Freq: Every day | ORAL | Status: DC

## 2023-09-16 MED ORDER — PERFLUTREN LIPID MICROSPHERE
1.0000 mL | INTRAVENOUS | Status: AC | PRN
  Administered 2023-09-16: 2 mL via INTRAVENOUS

## 2023-09-16 MED ORDER — HEPARIN (PORCINE) 25000 UT/250ML-% IV SOLN
1400.0000 [IU]/h | INTRAVENOUS | Status: DC
  Administered 2023-09-16 – 2023-09-17 (×2): 1500 [IU]/h via INTRAVENOUS
  Administered 2023-09-18: 1400 [IU]/h via INTRAVENOUS
  Filled 2023-09-16 (×3): qty 250

## 2023-09-16 MED ORDER — ASPIRIN 81 MG PO CHEW
81.0000 mg | CHEWABLE_TABLET | ORAL | Status: AC
  Administered 2023-09-17: 81 mg via ORAL
  Filled 2023-09-16: qty 1

## 2023-09-16 NOTE — Assessment & Plan Note (Addendum)
 Echo demonstrated EF of 45 - 50% with RWMA and severe concentric LVH.  Cardiology was consulted, and recommended repeat echo with contrast.  Output 2.5L yesterday and weight down 4 lbs. - Torsemide 20 mg on 7/1 - Spironolactone 25 mg daily - Continue home Coreg  12.5 mg BID, HCTZ 25 mg daily (adjusted home reg), Losartan  100 mg daily (inc from home dose) - Avoid SGLT2i given propensity for peripheral vascular complications per cards and concern for UTI symptoms/yeast w/ A1c - Monitor electrolytes w/ AM BMP & Mag - Echo with contrast per cardiology pending - Augment GDMT as able - Cardiology consulted, appreciate recommendations

## 2023-09-16 NOTE — Progress Notes (Signed)
 Occupational Therapy Treatment Patient Details Name: Joseph Ortega MRN: [id number] DOB: Aug 24, 1955 Today's Date: 09/16/2023   History of present illness Pt is a 68 y.o. M presenting to [facility] on 09/13/23 w/ several months of BLE edema. PMH includes Anxiety, Arthritis, Depression, Diabetes mellitus, Headache, HTN, Hypothyroidism, Sleep apnea, R MCA Stroke (left hemi).   OT comments  Patient received in supine and eager to participate. Patient able to get to EOB with CGA for trunk with HOB elevated. Patient able to stand stand from EOB with supervision and min assist for transfer to recliner. Patient able to doff socks and required assistance to donn sock on RLE. Discharge recommendations continue to be appropriate. Acute OT to continue to follow.       If plan is discharge home, recommend the following:  A little help with walking and/or transfers;A little help with bathing/dressing/bathroom;Assistance with cooking/housework;Assist for transportation;Help with stairs or ramp for entrance   Equipment Recommendations  None recommended by OT    Recommendations for Other Services      Precautions / Restrictions Precautions Precautions: Fall Recall of Precautions/Restrictions: Intact Restrictions Weight Bearing Restrictions Per Provider Order: No       Mobility Bed Mobility Overal bed mobility: Modified Independent Bed Mobility: Supine to Sit     Supine to sit: Contact guard, HOB elevated     General bed mobility comments: CGA for trunk    Transfers Overall transfer level: Needs assistance Equipment used: Rolling walker (2 wheels) Transfers: Sit to/from Stand, Bed to chair/wheelchair/BSC Sit to Stand: Supervision, From elevated surface     Step pivot transfers: Contact guard assist     General transfer comment: able to put LUE on walker and performed step pivot transfer to recliner with cues for hand placement to sit     Balance Overall balance assessment: Needs  assistance Sitting-balance support: No upper extremity supported, Feet supported Sitting balance-Leahy Scale: Good Sitting balance - Comments: seated EOB   Standing balance support: Bilateral upper extremity supported, During functional activity Standing balance-Leahy Scale: Poor Standing balance comment: reliant on external support                           ADL either performed or assessed with clinical judgement   ADL Overall ADL's : Needs assistance/impaired Eating/Feeding: Modified independent   Grooming: Wash/dry hands;Wash/dry face;Set up;Sitting           Upper Body Dressing : Set up;Sitting Upper Body Dressing Details (indicate cue type and reason): gown for ack Lower Body Dressing: Minimal assistance;Sitting/lateral leans Lower Body Dressing Details (indicate cue type and reason): assistance with donning right sock Toilet Transfer: Minimal assistance;Ambulation;Rolling walker (2 wheels)                  Extremity/Trunk Assessment Upper Extremity Assessment LUE Deficits / Details: hx of CVA, Limited active range but pt is able to use as a functional assist for all tasks and maintain grip on RW LUE Sensation: decreased light touch;decreased proprioception LUE Coordination: decreased fine motor;decreased gross motor            Vision       Perception     Praxis     Communication Communication Communication: No apparent difficulties   Cognition Arousal: Alert Behavior During Therapy: WFL for tasks assessed/performed Cognition: No apparent impairments             OT - Cognition Comments: WFL for orientation adn command following, likely  baseline                 Following commands: Intact        Cueing   Cueing Techniques: Verbal cues  Exercises      Shoulder Instructions       General Comments      Pertinent Vitals/ Pain       Pain Assessment Pain Assessment: Faces Faces Pain Scale: Hurts a little bit Pain  Location: LLE Pain Descriptors / Indicators: Discomfort Pain Intervention(s): Limited activity within patient's tolerance, Repositioned  Home Living                                          Prior Functioning/Environment              Frequency  Min 2X/week        Progress Toward Goals  OT Goals(current goals can now be found in the care plan section)  Progress towards OT goals: Progressing toward goals  Acute Rehab OT Goals Patient Stated Goal: to go home OT Goal Formulation: With patient Time For Goal Achievement: 09/27/23 Potential to Achieve Goals: Good ADL Goals Pt Will Perform Grooming: with modified independence Pt Will Perform Upper Body Dressing: with modified independence Pt Will Perform Lower Body Dressing: with modified independence;sit to/from stand;with adaptive equipment Pt Will Transfer to Toilet: with modified independence;ambulating Additional ADL Goal #1: Pt will navigate busy hospital environment with LRAD and no safety concerns to demonstrate decreased risk for falls at home  Plan      Co-evaluation                 AM-PAC OT 6 Clicks Daily Activity     Outcome Measure   Help from another person eating meals?: None Help from another person taking care of personal grooming?: A Little Help from another person toileting, which includes using toliet, bedpan, or urinal?: A Little Help from another person bathing (including washing, rinsing, drying)?: A Little Help from another person to put on and taking off regular upper body clothing?: A Little Help from another person to put on and taking off regular lower body clothing?: A Lot 6 Click Score: 18    End of Session Equipment Utilized During Treatment: Gait belt;Rolling walker (2 wheels)  OT Visit Diagnosis: Unsteadiness on feet (R26.81);Other abnormalities of gait and mobility (R26.89);Repeated falls (R29.6);Muscle weakness (generalized) (M62.81);Pain;Hemiplegia and  hemiparesis Hemiplegia - Right/Left: Left Hemiplegia - dominant/non-dominant: Non-Dominant Hemiplegia - caused by: Cerebral infarction   Activity Tolerance Patient tolerated treatment well   Patient Left in chair;with call bell/phone within reach;with chair alarm set   Nurse Communication Mobility status        Time: 9281-9258 OT Time Calculation (min): 23 min  Charges: OT General Charges $OT Visit: 1 Visit OT Treatments $Self Care/Home Management : 23-37 mins  [name], OTA Acute Rehabilitation Services  Office [phone]   [name] 09/16/2023, 9:58 AM

## 2023-09-16 NOTE — Assessment & Plan Note (Signed)
 Repeated yesterday; NSR, RBBB and T wave inversions noted in the lateral leads.

## 2023-09-16 NOTE — Progress Notes (Signed)
  Echocardiogram 2D Echocardiogram has been performed.  Joseph Ortega 09/16/2023, 10:28 AM

## 2023-09-16 NOTE — Progress Notes (Signed)
   09/16/23 2038  BiPAP/CPAP/SIPAP  BiPAP/CPAP/SIPAP Pt Type Adult  BiPAP/CPAP/SIPAP Resmed  Mask Type Nasal mask  Respiratory Rate 18 breaths/min  EPAP 10 cmH2O  Patient Home Machine No  Patient Home Mask No  Patient Home Tubing No  Auto Titrate No

## 2023-09-16 NOTE — Assessment & Plan Note (Addendum)
 T2DM: Takes 40U LAI daily, SSI with meals, and was previously on Trulicity  3 mg weekly.  Last A1c 11.5 in 2/25.  CBGs improved this AM, will decreased Semglee  to 35u daily and moderate SSI.   HTN: Management as above Hypothyroidism: TSH elevated to 10.157, reduced dose of Synthroid  to 100 mcg.  Follow-up outpatient in 4 weeks. HLD: Home Crestor  40 mg daily Anxiety/depression: BuSpar  10 mg TID, Cymbalta  30 mg BID History of CVA: ASA 81mg  daily, gabapentin  300 mg TID OSA: nightly CPAP GERD: Famotidine 20 mg daily to help

## 2023-09-16 NOTE — Progress Notes (Signed)
 Daily Progress Note Intern Pager: [phone]  Patient name: Joseph Ortega Medical record number: [id number] Date of birth: [birth year] Age: 68 y.o. Gender: male  Primary Care Provider: Howell Lunger, DO Consultants: Cardiology Code Status: Full Code  Pt Overview and Major Events to Date:  6/27: Admitted  Assessment and Plan: [name] is a 68 y.o. male with PMH of T2DM, HTN, hypothyroidism, HLD, anxiety, prior CVA, and OSA admitted for LE edema likely secondary to venous stasis vs HFmrEF.  Cardiology consulted and awaiting limited echo with contrast, will adjust GDMT as able. Assessment & Plan Heart failure with mildly reduced ejection fraction (HFmrEF) (HCC) Echo demonstrated EF of 45 - 50% with RWMA and severe concentric LVH.  Cardiology was consulted, and recommended repeat echo with contrast.  Output 2.5L yesterday and weight down 4 lbs. - Torsemide 20 mg on 7/1 - Spironolactone 25 mg daily - Continue home Coreg  12.5 mg BID, HCTZ 25 mg daily (adjusted home reg), Losartan  100 mg daily (inc from home dose) - Avoid SGLT2i given propensity for peripheral vascular complications per cards and concern for UTI symptoms/yeast w/ A1c - Monitor electrolytes w/ AM BMP & Mag - Echo with contrast per cardiology pending - Augment GDMT as able - Cardiology consulted, appreciate recommendations Abnormal EKG Repeated yesterday; NSR, RBBB and T wave inversions noted in the lateral leads.  Chronic health problem T2DM: Takes 40U LAI daily, SSI with meals, and was previously on Trulicity  3 mg weekly.  Last A1c 11.5 in 2/25.  CBGs improved this AM, will decreased Semglee  to 35u daily and moderate SSI.   HTN: Management as above Hypothyroidism: TSH elevated to 10.157, reduced dose of Synthroid  to 100 mcg.  Follow-up outpatient in 4 weeks. HLD: Home Crestor  40 mg daily Anxiety/depression: BuSpar  10 mg TID, Cymbalta  30 mg BID History of CVA: ASA 81mg  daily, gabapentin  300 mg TID OSA:  nightly CPAP GERD: Famotidine 20 mg daily to help Acute systolic heart failure (HCC)   FEN/GI: Heart healthy/carb modified PPx: Lovenox  Dispo:Pending echo, cardiology recs, and oral diuresis  Subjective:  Patient is doing well this morning.  No concerns this morning.  Objective: Temp:  [97.7 F (36.5 C)-98.3 F (36.8 C)] 97.8 F (36.6 C) (06/30 1107) Pulse Rate:  [51-67] 62 (06/30 1107) Resp:  [16-20] 18 (06/30 1107) BP: (92-171)/(55-83) 92/55 (06/30 1107) SpO2:  [95 %-100 %] 99 % (06/30 1107) FiO2 (%):  [21 %] 21 % (06/29 2149) Weight:  [97.7 kg] 97.7 kg (06/30 0523) Physical Exam: General: Awake and Alert in NAD HEENT: NCAT. Sclera anicteric. No rhinorrhea. Cardiovascular: RRR. No M/R/G Respiratory: CTAB, normal WOB on RA. No wheezing, crackles, rhonchi, or diminished breath sounds. Abdomen: Soft, non-tender, non-distended. Bowel sounds normoactive Extremities: Able to move all extremities. No BLE edema, no deformities or significant joint findings. Skin: Warm and dry.  Chronic venous stasis wounds in LLE > RLE w/ TTP over largest wound Neuro: A&Ox3. No focal neurological deficits.  Laboratory: Most recent CBC Lab Results  Component Value Date   WBC 7.4 09/16/2023   HGB 15.9 09/16/2023   HCT 46.8 09/16/2023   MCV 95.5 09/16/2023   PLT 190 09/16/2023   Most recent BMP    Latest Ref Rng & Units 09/16/2023    3:48 AM  BMP  Glucose 70 - 99 mg/dL 82   BUN 8 - 23 mg/dL 20   Creatinine 9.38 - 1.24 mg/dL 9.00   Sodium 864 - 854 mmol/L 139   Potassium  3.5 - 5.1 mmol/L 4.2   Chloride 98 - 111 mmol/L 98   CO2 22 - 32 mmol/L 31   Calcium  8.9 - 10.3 mg/dL 9.2    Mag: 2.1  Imaging/Diagnostic Tests: No new imaging.  Janna Ferrier, DO 09/16/2023, 2:24 PM  PGY-1, [facility] FPTS Intern pager: [phone], text pages welcome Secure chat group [facility] Teaching Service

## 2023-09-16 NOTE — Progress Notes (Signed)
 PHARMACY - ANTICOAGULATION CONSULT NOTE  Pharmacy Consult for heparin  gtt Indication: suspected LV apical thrombus on echo  No Known Allergies  Patient Measurements: Height: 6' (182.9 cm) Weight: 97.7 kg (215 lb 6.4 oz) IBW/kg (Calculated) : 77.6 HEPARIN  DW (KG): 98.9  Vital Signs: Temp: 97.8 F (36.6 C) (06/30 1107) Temp Source: Oral (06/30 1107) BP: 92/55 (06/30 1107) Pulse Rate: 62 (06/30 1107)  Labs: Recent Labs    09/14/23 0247 09/15/23 0344 09/16/23 0348  HGB  --   --  15.9  HCT  --   --  46.8  PLT  --   --  190  CREATININE 0.80 0.88 0.99    Estimated Creatinine Clearance: 88.9 mL/min (by C-G formula based on SCr of 0.99 mg/dL).   Medical History: Past Medical History:  Diagnosis Date   Anxiety    Arthritis    Depression    Diabetes mellitus without complication (HCC)    Hypertensive crisis 02/22/2023   Hypothyroidism    Sleep apnea    wears CPAP   Stroke (HCC)    1 stroke 2/24, and 2 strokes on 03/09/21   Thyroid disease     Medications:  Medications Prior to Admission  Medication Sig Dispense Refill Last Dose/Taking   acetaminophen  (TYLENOL ) 325 MG tablet Take 1-2 tablets (325-650 mg total) by mouth every 4 (four) hours as needed for mild pain (pain score 1-3).   09/12/2023 Evening   ascorbic acid  (VITAMIN C ) 500 MG tablet Take 1 tablet (500 mg total) by mouth daily with supper.   Past Month   aspirin  EC 81 MG tablet Take 1 tablet (81 mg total) by mouth daily. Swallow whole. 30 tablet 12 09/12/2023 Morning   busPIRone  (BUSPAR ) 10 MG tablet Take 10 mg by mouth 3 (three) times daily.   09/12/2023 Evening   carvedilol  (COREG ) 12.5 MG tablet TAKE 1 TABLET (12.5MG  TOTAL) BY MOUTH TWICE A DAY WITH MEALS 180 tablet 2 09/12/2023 Noon   cyclobenzaprine  (FLEXERIL ) 10 MG tablet Take 10 mg by mouth 2 (two) times daily as needed.   09/09/2023   docusate sodium  (COLACE) 100 MG capsule Take 100 mg by mouth daily.   09/12/2023 Morning   Dulaglutide  (TRULICITY ) 3  MG/0.5ML SOPN Inject 3 mg into the skin once a week. (Patient taking differently: Inject 3 mg into the skin once a week. Inject on Sunday) 2 mL 0 09/08/2023   DULoxetine  (CYMBALTA ) 30 MG capsule Take 30 mg by mouth 2 (two) times daily.   09/12/2023 Bedtime   gabapentin  (NEURONTIN ) 300 MG capsule Take 1 capsule (300 mg total) by mouth 3 (three) times daily. 90 capsule 4 09/09/2023   guanFACINE (INTUNIV) 2 MG TB24 ER tablet Take 2 mg by mouth daily.   09/12/2023 Morning   hydrochlorothiazide  (HYDRODIURIL ) 12.5 MG tablet Take 1 tablet (12.5 mg total) by mouth 2 (two) times daily. (Patient taking differently: Take 12.5 mg by mouth 3 (three) times daily.) 60 tablet 0 09/12/2023 Noon   ibuprofen  (ADVIL ) 200 MG tablet Take 400 mg by mouth every 6 (six) hours as needed for headache or mild pain (pain score 1-3).   09/09/2023   insulin  detemir (LEVEMIR ) 100 UNIT/ML FlexPen Inject 23 Units into the skin at bedtime. (Patient taking differently: Inject 40 Units into the skin at bedtime.)   09/11/2023   losartan  (COZAAR ) 25 MG tablet Take 1 tablet (25 mg total) by mouth at bedtime. 90 tablet 3 09/11/2023   Multiple Vitamins-Minerals (ALIVE MENS GUMMY MULTIVITAMINS) CHEW Chew 3  tablets by mouth daily.   09/12/2023 Morning   NOVOLOG  FLEXPEN 100 UNIT/ML FlexPen Inject 15-20 Units into the skin 3 (three) times daily with meals. Inject 15-20 units into the skin 3 times daily with meals   09/12/2023 Noon   potassium chloride  SA (KLOR-CON  M20) 20 MEQ tablet Take 1 tablet (20 mEq total) by mouth daily. 30 tablet 0 09/12/2023 Morning   rosuvastatin  (CRESTOR ) 40 MG tablet Take 1 tablet (40 mg total) by mouth daily. 30 tablet 0 09/12/2023 Morning   traZODone  (DESYREL ) 100 MG tablet Take 100 mg by mouth at bedtime as needed for sleep.   09/11/2023    Assessment: 68 YOM admitted w/ cc BLE edema noted to have suspected LV apical thrombus on echo. Patient has a history of multiple strokes. Most recent was 04/2022 R MCA infarct w/ hemorrhagic  conversion. He does  not take AC PTA.   Goal of Therapy:  Heparin  level 0.3-0.7 units/ml Monitor platelets by anticoagulation protocol: Yes   Plan:  Give 4000 units bolus x 1 Start heparin  infusion at 1500 units/hr Check anti-Xa level in 8 hours and daily while on heparin  Continue to monitor H&H and platelets  Prashant Glosser BS, PharmD, BCPS Clinical Pharmacist 09/16/2023 3:57 PM  Contact: [phone] after 3 PM  Be curious, not judgmental... -Davina Sprinkles

## 2023-09-16 NOTE — Progress Notes (Signed)
 Mobility Specialist Progress Note:   09/16/23 1505  Mobility  Activity Stood at bedside (4 STS w/lateral steps to [facility])  Level of Assistance Moderate assist, patient does 50-74%  Assistive Device Front wheel walker  Activity Response Tolerated fair;RN notified;Other (Comment) (Left sided weakness, difficulty taking steps)  Mobility Referral Yes  Mobility visit 1 Mobility  Mobility Specialist Start Time (ACUTE ONLY) 1505  Mobility Specialist Stop Time (ACUTE ONLY) 1535  Mobility Specialist Time Calculation (min) (ACUTE ONLY) 30 min   Pt not feeling well but agreeable to session. Was feeling lightheaded, had not eaten d/t being asleep. Took a lying and EOB BP (see below) both reading low. Pt attempted stand; needed bed height raised and ModA to stand w/ RW. Pt unable to take steps forward d/t left sided weakness and lightheadedness. Alerted RN pt needed cleaning. Was able to do several more stand as well as lateral steps w/ +2 assist to help in the cleaning process. Laid pt back in bed w/ nursing staff to finish cleaning.   Pre Mobility: Lying BP 92/50 (64) During Mobility: EOB BP 89/57 (67) Post Mobility:  Therisa Rana Mobility Specialist Please contact via SecureChat or  Rehab office at [phone]

## 2023-09-16 NOTE — TOC CM/SW Note (Addendum)
 Transition of Care [facility]) - Inpatient Brief Assessment   Patient Details  Name: Joseph Ortega MRN: [id number] Date of Birth: 08/23/1955  Transition of Care [facility]) CM/SW Contact:    Waddell Barnie Rama, RN Phone Number: 09/16/2023, 11:06 AM   Clinical Narrative: From home with spouse, has PCP and insurance on file, states has no HH services in place at this time, has a walker, cpap machine and home oxygen at night 2 liters with adapt at home.  States he will have transport home by a w/chair fleeta , could not give this NCM the name, He gives this NCM permission to speak with wife.  Wife is support system, states gets medications from [facility] .  Pta self ambulatory with walker.  He eats a low sodium diet, he has a scale for daily weights.  NCM left vm for wife to return call, trying to find out the transportation name. Per pt/ot eval rec HHPT, HHOT, NCM offered choice, he has no preference,  NCM left vm for Cindie with [employer], awaiting a call back.  Per Cindi she can take referral.  Soc will begin 24 to 48 hrs post dc.  Will need HHPT,HHOT orders.   Transition of Care Asessment: Insurance and Status: Insurance coverage has been reviewed Patient has primary care physician: Yes Home environment has been reviewed: home with wife Prior level of function:: ambulatory with walker, cpap, oxygen 2 liters with adapt Prior/Current Home Services: Current home services Social Drivers of Health Review: SDOH reviewed no interventions necessary Readmission risk has been reviewed: Yes Transition of care needs: transition of care needs identified, TOC will continue to follow

## 2023-09-16 NOTE — Plan of Care (Signed)
   Problem: Nutrition: Goal: Adequate nutrition will be maintained Outcome: Progressing   Problem: Coping: Goal: Level of anxiety will decrease Outcome: Progressing   Problem: Safety: Goal: Ability to remain free from injury will improve Outcome: Progressing

## 2023-09-16 NOTE — Progress Notes (Addendum)
 Rounding Note   Patient Name: Joseph Ortega Date of Encounter: 09/16/2023  [facility] Cardiologist: None   Subjective Net of 2.1 L yesterday, -13.2 L on admission.  Creatinine stable at 0.99.  BP 132/69.  Weight down 4 pounds from yesterday, down 25 pounds on admission.  He denies any dyspnea.  Scheduled Meds:  ascorbic acid   500 mg Oral Q supper   aspirin  EC  81 mg Oral Daily   busPIRone   10 mg Oral TID   carvedilol   12.5 mg Oral BID WC   DULoxetine   30 mg Oral BID   enoxaparin  (LOVENOX ) injection  40 mg Subcutaneous Daily   furosemide   80 mg Oral BID   gabapentin   300 mg Oral TID   hydrochlorothiazide   25 mg Oral Daily   insulin  aspart  0-15 Units Subcutaneous TID WC   insulin  glargine-yfgn  40 Units Subcutaneous Daily   levothyroxine   100 mcg Oral Q0600   losartan   100 mg Oral QHS   rosuvastatin   40 mg Oral Daily   spironolactone  25 mg Oral Daily   Continuous Infusions:   PRN Meds: acetaminophen    Vital Signs  Vitals:   09/15/23 2358 09/16/23 0000 09/16/23 0523 09/16/23 0719  BP: (!) 171/83  (!) 154/73 132/69  Pulse: (!) 52  60 67  Resp:  18 16 18   Temp:  97.8 F (36.6 C) 97.7 F (36.5 C) 98.3 F (36.8 C)  TempSrc:  Oral Oral Oral  SpO2: 98%  100% 96%  Weight:   97.7 kg   Height:        Intake/Output Summary (Last 24 hours) at 09/16/2023 0835 Last data filed at 09/16/2023 0700 Gross per 24 hour  Intake 240 ml  Output 2500 ml  Net -2260 ml      09/16/2023    5:23 AM 09/15/2023    5:00 AM 09/14/2023    4:31 AM  Last 3 Weights  Weight (lbs) 215 lb 6.4 oz 219 lb 3.2 oz 223 lb  Weight (kg) 97.705 kg 99.428 kg 101.152 kg      Telemetry NSR - Personally Reviewed  ECG  N/a - Personally Reviewed  Physical Exam  GEN: No acute distress.   Neck: No JVD Cardiac: RRR, no murmurs, rubs, or gallops.  Respiratory: Clear to auscultation bilaterally. GI: Soft, nontender, non-distended  MS: No edema Neuro:  Nonfocal  Psych: Normal affect    Labs High Sensitivity Troponin:   Recent Labs  Lab 09/12/23 2212 09/13/23 0145 09/13/23 0843  TROPONINIHS 18* 20* 25*     Chemistry Recent Labs  Lab 09/12/23 2212 09/12/23 2317 09/14/23 0247 09/15/23 0344 09/16/23 0348  NA 136   < > 137 136 139  K 4.1   < > 3.2* 3.5 4.2  CL 100   < > 94* 95* 98  CO2 25   < > 29 30 31   GLUCOSE 315*   < > 244* 142* 82  BUN 13   < > 16 18 20   CREATININE 0.73   < > 0.80 0.88 0.99  CALCIUM  8.9   < > 9.0 9.0 9.2  MG  --   --  1.9 1.8 2.1  PROT 7.1  --   --   --   --   ALBUMIN 3.5  --   --   --   --   AST 27  --   --   --   --   ALT 18  --   --   --   --  ALKPHOS 123  --   --   --   --   BILITOT 1.3*  --   --   --   --   GFRNONAA >60   < > >60 >60 >60  ANIONGAP 11   < > 14 11 10    < > = values in this interval not displayed.    Lipids  Recent Labs  Lab 09/13/23 0443  CHOL 144  TRIG 37  HDL 57  LDLCALC 80  CHOLHDL 2.5    Hematology Recent Labs  Lab 09/12/23 2212 09/12/23 2317 09/13/23 0442 09/16/23 0348  WBC 5.0  --  4.7 7.4  RBC 3.94*  --  3.78* 4.90  HGB 12.9* 13.3 12.3* 15.9  HCT 38.7* 39.0 37.6* 46.8  MCV 98.2  --  99.5 95.5  MCH 32.7  --  32.5 32.4  MCHC 33.3  --  32.7 34.0  RDW 14.8  --  14.9 14.6  PLT 141*  --  131* 190   Thyroid  Recent Labs  Lab 09/13/23 0443  TSH 10.157*    BNP Recent Labs  Lab 09/12/23 2212  BNP 816.0*    DDimer No results for input(s): DDIMER in the last 168 hours.   Radiology  No results found.    Patient Profile   Joseph Ortega is a 68 y.o. male with a hx of HTN diabetes mellitus who is being seen today for the evaluation of lower extremity swelling and a reduced ejection fraction at the request of Dr. Madelon.   Assessment & Plan  1.Acute systolic heart failure - 02/2023 echo: 55-60%, severe LVH, grade I dd - 08/2023 echo (limited visualization): LVEF 45-50%, grade II dd, normal RV, dilated fixed IVC. Small PFO - limited echo with contrast pending.  - CXR no  acute process, BNP 816 - per Dr Denver note plans for outpatient ischemic testimg and evaluation for infiltrative CM - limited echo with contrast for better assessment of LVEF pending. If low LVEF confirmed with contrast adjust HF regimen further.  - medical therapy with coreg  12.5mg  bid, losartan  100, aldactone 25mg  daily.  Add Jardiance 10 mg daily -Has diuresed well with IV Lasix  40 mg twice daily.  Appears euvolemic, received IV Lasix  40 mg this morning.  Will transition to p.o. torsemide tomorrow  ADDENDUM: Echo today with EF 35-40% and aneurysmal LV apex, ?thrombus.  Will start heparin  gtt.  Plan LHC/RHC tomorrow.  If LHC unremarkable, will need CMR for further evaluation of suspect infiltrative disease.Risks and benefits of cardiac catheterization have been discussed with the patient.  These include bleeding, infection, kidney damage, stroke, heart attack, death.  The patient understands these risks and is willing to proceed.    For questions or updates, please contact [facility] Please consult [email or web address] for contact info under     Signed, Lonni LITTIE Nanas, MD  09/16/2023, 8:35 AM

## 2023-09-16 NOTE — Inpatient Diabetes Management (Signed)
 Inpatient Diabetes Program Recommendations  AACE/ADA: New Consensus Statement on Inpatient Glycemic Control   Target Ranges:  Prepandial:   less than 140 mg/dL      Peak postprandial:   less than 180 mg/dL (1-2 hours)      Critically ill patients:  140 - 180 mg/dL    Latest Reference Range & Units 09/15/23 06:09 09/15/23 11:23 09/15/23 17:10 09/15/23 21:15 09/16/23 06:31  Glucose-Capillary 70 - 99 mg/dL 821 (H) 818 (H) 849 (H) 111 (H) 91   Review of Glycemic Control  Diabetes history: DM2 Outpatient Diabetes medications: Levemir  40 units at bedtime, Novolog  15-20 units TID with meals, Trulicity  3 mg Qweek Current orders for Inpatient glycemic control: Semglee  40 units daily, Novolog  0-15 units TID with meals  Inpatient Diabetes Program Recommendations:    Insulin : CBG 91 mg/dl this morning. May want to consider decreasing Semglee  to 38 units daily.  Thanks, Earnie Gainer, RN, MSN, CDCES Diabetes Coordinator Inpatient Diabetes Program [phone] (Team Pager from 8am to 5pm)

## 2023-09-16 NOTE — Care Management Important Message (Signed)
 Important Message  Patient Details  Name: Joseph Ortega MRN: [id number] Date of Birth: [birth year]   Important Message Given:  Yes - Medicare IM     [name] Sharps 09/16/2023, 10:34 AM

## 2023-09-17 ENCOUNTER — Inpatient Hospital Stay (HOSPITAL_COMMUNITY)

## 2023-09-17 DIAGNOSIS — I513 Intracardiac thrombosis, not elsewhere classified: Secondary | ICD-10-CM

## 2023-09-17 DIAGNOSIS — N179 Acute kidney failure, unspecified: Secondary | ICD-10-CM

## 2023-09-17 DIAGNOSIS — I422 Other hypertrophic cardiomyopathy: Secondary | ICD-10-CM | POA: Diagnosis not present

## 2023-09-17 DIAGNOSIS — R600 Localized edema: Secondary | ICD-10-CM | POA: Diagnosis not present

## 2023-09-17 DIAGNOSIS — I5022 Chronic systolic (congestive) heart failure: Secondary | ICD-10-CM | POA: Diagnosis not present

## 2023-09-17 DIAGNOSIS — I5021 Acute systolic (congestive) heart failure: Secondary | ICD-10-CM | POA: Diagnosis not present

## 2023-09-17 LAB — CBC
HCT: 44.7 % (ref 39.0–52.0)
Hemoglobin: 14.9 g/dL (ref 13.0–17.0)
MCH: 32.4 pg (ref 26.0–34.0)
MCHC: 33.3 g/dL (ref 30.0–36.0)
MCV: 97.2 fL (ref 80.0–100.0)
Platelets: 208 10*3/uL (ref 150–400)
RBC: 4.6 MIL/uL (ref 4.22–5.81)
RDW: 14.8 % (ref 11.5–15.5)
WBC: 8.2 10*3/uL (ref 4.0–10.5)
nRBC: 0 % (ref 0.0–0.2)

## 2023-09-17 LAB — BASIC METABOLIC PANEL WITH GFR
Anion gap: 12 (ref 5–15)
BUN: 32 mg/dL — ABNORMAL HIGH (ref 8–23)
CO2: 32 mmol/L (ref 22–32)
Calcium: 8.9 mg/dL (ref 8.9–10.3)
Chloride: 93 mmol/L — ABNORMAL LOW (ref 98–111)
Creatinine, Ser: 1.64 mg/dL — ABNORMAL HIGH (ref 0.61–1.24)
GFR, Estimated: 46 mL/min — ABNORMAL LOW (ref >60)
Glucose, Bld: 195 mg/dL — ABNORMAL HIGH (ref 70–99)
Potassium: 3.8 mmol/L (ref 3.5–5.1)
Sodium: 137 mmol/L (ref 135–145)

## 2023-09-17 LAB — GLUCOSE, CAPILLARY
Glucose-Capillary: 199 mg/dL — ABNORMAL HIGH (ref 70–99)
Glucose-Capillary: 144 mg/dL — ABNORMAL HIGH (ref 70–99)
Glucose-Capillary: 173 mg/dL — ABNORMAL HIGH (ref 70–99)
Glucose-Capillary: 173 mg/dL — ABNORMAL HIGH (ref 70–99)

## 2023-09-17 LAB — MAGNESIUM: Magnesium: 2.1 mg/dL (ref 1.7–2.4)

## 2023-09-17 LAB — HEPARIN LEVEL (UNFRACTIONATED)
Heparin Unfractionated: 0.55 [IU]/mL (ref 0.30–0.70)
Heparin Unfractionated: 0.69 [IU]/mL (ref 0.30–0.70)

## 2023-09-17 MED ORDER — GADOBUTROL 1 MMOL/ML IV SOLN
10.0000 mL | Freq: Once | INTRAVENOUS | Status: AC | PRN
  Administered 2023-09-17: 10 mL via INTRAVENOUS

## 2023-09-17 MED ORDER — POTASSIUM CHLORIDE CRYS ER 20 MEQ PO TBCR
40.0000 meq | EXTENDED_RELEASE_TABLET | Freq: Once | ORAL | Status: AC
  Administered 2023-09-17: 40 meq via ORAL
  Filled 2023-09-17: qty 2

## 2023-09-17 NOTE — Progress Notes (Signed)
 PHARMACY - ANTICOAGULATION Pharmacy Consult for heparin  Indication: LV thrombus Brief A/P: Heparin  level within goal range Continue Heparin  at current rate  No Known Allergies  Patient Measurements: Height: 6' (182.9 cm) Weight: 97.7 kg (215 lb 6.4 oz) IBW/kg (Calculated) : 77.6 HEPARIN  DW (KG): 98.9  Vital Signs: Temp: 98.2 F (36.8 C) (07/01 0021) Temp Source: Oral (07/01 0021) BP: 100/60 (07/01 0021) Pulse Rate: 60 (07/01 0021)  Labs: Recent Labs    09/15/23 0344 09/16/23 0348 09/17/23 0058  HGB  --  15.9 14.9  HCT  --  46.8 44.7  PLT  --  190 208  HEPARINUNFRC  --   --  0.55  CREATININE 0.88 0.99 1.64*    Estimated Creatinine Clearance: 53.6 mL/min (A) (by C-G formula based on SCr of 1.64 mg/dL (H)).  Assessment: 68 yo male noted to have suspected LV apical thrombus on ECHO for heparin .   Goal of Therapy:  Heparin  level 0.3-0.7 units/ml Monitor platelets by anticoagulation protocol: Yes   Plan:  No change to heparin  Follow up after cath today   Cathlyn Arrant, PharmD, BCPS

## 2023-09-17 NOTE — Assessment & Plan Note (Addendum)
 HDS on RA. Diuresing well prior to holding secondary to AKI. Heart cath delayed due to Cr up to 1.67 from 0.99 yesterday. Heart cath planned 7/2 if medically stable. Cardiac MRI today, per cards.  -D/C'd: Torsemide 20 mg 2/2 AKI - Continue home Coreg  12.5 mg BID - Avoid SGLT2i given propensity for peripheral vascular complications per cards and concern for UTI symptoms/yeast w/ A1c - Monitor electrolytes w/ AM BMP & Mag - Echo with contrast per cardiology showed LVEF 35-40% - Augment GDMT as able - Cardiology consulted, appreciate recommendations  - Cardiac MRI today

## 2023-09-17 NOTE — Plan of Care (Signed)

## 2023-09-17 NOTE — Assessment & Plan Note (Addendum)
 Repeated today; potential new T-wave inversions in lateral leads, review of January ECG.  - repeat ECG today

## 2023-09-17 NOTE — Progress Notes (Addendum)
  Progress Note  Patient Name: Joseph Ortega Date of Encounter: 09/17/2023 [facility] Cardiologist: Lynwood Schilling, MD   Interval Summary   Patient reports feeling good Explained why his cath is being delayed and meds are being held, patient is understanding Denies any active chest pain, shortness of breath  Feels better post-diuresis   Vital Signs Vitals:   09/16/23 1949 09/17/23 0021 09/17/23 0330 09/17/23 0721  BP: (!) 105/59 100/60 (!) 111/55 106/60  Pulse: 69 60 60 (!) 51  Resp: 20 20 20 20   Temp: 98.2 F (36.8 C) 98.2 F (36.8 C) 98.5 F (36.9 C) 97.7 F (36.5 C)  TempSrc: Oral Oral Oral Axillary  SpO2: 93% 94% 97% 98%  Weight:   98.1 kg   Height:        Intake/Output Summary (Last 24 hours) at 09/17/2023 0913 Last data filed at 09/17/2023 9383 Gross per 24 hour  Intake 485.7 ml  Output 500 ml  Net -14.3 ml      09/17/2023    3:30 AM 09/16/2023    5:23 AM 09/15/2023    5:00 AM  Last 3 Weights  Weight (lbs) 216 lb 4.3 oz 215 lb 6.4 oz 219 lb 3.2 oz  Weight (kg) 98.1 kg 97.705 kg 99.428 kg     Telemetry/ECG  Sinus bradycardia, HR mid-50s - Personally Reviewed  Physical Exam  GEN: No acute distress.   Neck: No JVD Cardiac: RRR, no murmurs, rubs, or gallops.  Respiratory: Clear to auscultation bilaterally. GI: Soft, nontender, non-distended  MS: No edema  Assessment & Plan  68 y.o. male with a hx of HTN diabetes mellitus who is being seen for the evaluation of lower extremity swelling and a reduced ejection fraction   Acute HFrEF Possible LV thrombus per echo  Echo 02/2023: LVEF 55-60%, severe LVH, G1DD Echo 08/2023: LVEF 45-50%, G2DD, normal RV, dilated IVC, small PFO Echo this admission: LVEF 35-40%, aneurysmal LV apex BNP 816 Net -13 L this admission  Most recent BP 106/60 with HR 52  Continue strict I&O's, daily weights, daily BMPs Continue on CoReg  12.5 mg BID, as tolerated Continue IV heparin  per pharmacy  Held losartan ,  spironolactone, torsemide, hydrochlorothiazide , Jardiance due to AKI Originally planned for RHC/LHC today but creatine up to 1.64 from 0.99 so hold off and plan for cath tomorrow  NPO at midnight Plan cardiac MRI today to evaluate for infiltrative disease and LV thrombus   For questions or updates, please contact [facility] Please consult [email or web address] for contact info under       Signed, Waddell DELENA Donath, PA-C   Patient seen and examined.  Agree with above documentation.  On exam, patient is alert and oriented, regular rate and rhythm, no murmurs, lungs CTAB, no LE edema or JVD.  Worsening renal function, creatinine bumped from 0.99-1.64.  He appears euvolemic on exam.  Had plan for LHC/RHC today but will hold off given AKI.  Will instead plan cardiac MRI today to evaluate for infiltrative disease and LV thrombus.  Will hold diuretics, if renal function improved tomorrow, can plan for LHC/RHC.  Lonni LITTIE Nanas, MD

## 2023-09-17 NOTE — Assessment & Plan Note (Signed)
 HDS on RA. Diuresing well prior to holding secondary to AKI. Heart cath delayed due to Cr up to 1.67 from 0.99 yesterday. Heart cath planned 7/2 if medically stable. Cardiac MRI today, per cards.  -D/C'd: Torsemide 20 mg 2/2 AKI - Continue home Coreg  12.5 mg BID - Avoid SGLT2i given propensity for peripheral vascular complications per cards and concern for UTI symptoms/yeast w/ A1c - Monitor electrolytes w/ AM BMP & Mag - Echo with contrast per cardiology showed LVEF 35-40% - Augment GDMT as able - Cardiology consulted, appreciate recommendations  - Cardiac MRI today

## 2023-09-17 NOTE — Assessment & Plan Note (Signed)
 T2DM: Takes 40U LAI daily, SSI with meals, and was previously on Trulicity  3 mg weekly.  Last A1c 11.5 in 2/25.  CBGs improved this AM, will decreased Semglee  to 35u daily and moderate SSI.   HTN: Management as above Hypothyroidism: TSH elevated to 10.157, reduced dose of Synthroid  to 100 mcg.  Follow-up outpatient in 4 weeks. HLD: Home Crestor  40 mg daily Anxiety/depression: BuSpar  10 mg TID, Cymbalta  30 mg BID History of CVA: ASA 81mg  daily, gabapentin  300 mg TID OSA: nightly CPAP GERD: Famotidine 20 mg daily to help

## 2023-09-17 NOTE — Progress Notes (Signed)
 Daily Progress Note Intern Pager: [phone]  Patient name: Joseph Ortega Medical record number: [id number] Date of birth: 29-Nov-1955 Age: 68 y.o. Gender: male  Primary Care Provider: Howell Lunger, DO Consultants: Cardiology Code Status: Full Code  Pt Overview and Major Events to Date:  6/27: admitted  Medical Decision Making:  Joseph Ortega is a 67 y.o. male admitted HFmrEF exacerbation repeat echo showed new LV wall abnormalitiy cardiology consulted with recommendation for LHC/RHC. Heart cath delayed 2/2 new AKI likely secondary to diuresis and GDMT. GDTM held pending Cr improvement.   Pertinent PMH/PSH includes T2DM, HTN, HLD, prior CVA, OSA, hypothyroidism, and anxiety.  Assessment & Plan Heart failure with mildly reduced ejection fraction (HFmrEF) (HCC) Acute systolic heart failure (HCC) HDS on RA. Diuresing well prior to holding secondary to AKI. Heart cath delayed due to Cr up to 1.67 from 0.99 yesterday. Heart cath planned 7/2 if medically stable. Cardiac MRI today, per cards.  -D/C'd: Torsemide 20 mg 2/2 AKI - Continue home Coreg  12.5 mg BID - Avoid SGLT2i given propensity for peripheral vascular complications per cards and concern for UTI symptoms/yeast w/ A1c - Monitor electrolytes w/ AM BMP & Mag - Echo with contrast per cardiology showed LVEF 35-40% - Augment GDMT as able - Cardiology consulted, appreciate recommendations  - Cardiac MRI today AKI (acute kidney injury) (HCC) Cr 1.67 up from 0.99 yesterday likely 2/2 to diuresis, plus increase in losartan , and GDMT.  -- HCTZ, Jardiance, losartan , diuresis, and spironolactone held -AM BMP Abnormal EKG Repeated today; potential new T-wave inversions in lateral leads, review of January ECG.  - repeat ECG today Chronic health problem T2DM: Takes 40U LAI daily, SSI with meals, and was previously on Trulicity  3 mg weekly.  Last A1c 11.5 in 2/25.  CBGs improved this AM, will decreased Semglee  to 35u daily and  moderate SSI.   HTN: Management as above Hypothyroidism: TSH elevated to 10.157, reduced dose of Synthroid  to 100 mcg.  Follow-up outpatient in 4 weeks. HLD: Home Crestor  40 mg daily Anxiety/depression: BuSpar  10 mg TID, Cymbalta  30 mg BID History of CVA: ASA 81mg  daily, gabapentin  300 mg TID OSA: nightly CPAP GERD: Famotidine 20 mg daily to help   FEN/GI: Heart healthy/carb modified PPx: Lovenox  Dispo:Pending clinical improvement.  Subjective:  Patient is seen lying in bed this morning. He has no complaints at this time. He states that cardiology had been by to see him and explained the delay in his heart cath due to his increased creatinine. He was preparing to order breakfast.   Objective: Temp:  [97.7 F (36.5 C)-98.5 F (36.9 C)] 97.7 F (36.5 C) (07/01 0721) Pulse Rate:  [51-69] 51 (07/01 0721) Resp:  [18-20] 20 (07/01 0721) BP: (88-111)/(55-70) 106/60 (07/01 0721) SpO2:  [93 %-99 %] 98 % (07/01 0721) Weight:  [98.1 kg] 98.1 kg (07/01 0330) Physical Exam: General: Alert and awake, NAD.  Cardiovascular: RRR, no MRG. No visible JVD. Respiratory: CTAB, normal work of breathing on room air.  Abdomen: soft, non-tender, non-distended.  Extremities: No edema, warm, dry.  UOP 24hrs -500  Laboratory: Most recent CBC Lab Results  Component Value Date   WBC 8.2 09/17/2023   HGB 14.9 09/17/2023   HCT 44.7 09/17/2023   MCV 97.2 09/17/2023   PLT 208 09/17/2023   Most recent BMP    Latest Ref Rng & Units 09/17/2023   12:58 AM  BMP  Glucose 70 - 99 mg/dL 804   BUN 8 - 23 mg/dL  32   Creatinine 0.61 - 1.24 mg/dL 8.35   Sodium 864 - 854 mmol/L 137   Potassium 3.5 - 5.1 mmol/L 3.8   Chloride 98 - 111 mmol/L 93   CO2 22 - 32 mmol/L 32   Calcium  8.9 - 10.3 mg/dL 8.9     Joseph Raguel MATSU, DO 09/17/2023, 8:15 AM  PGY-1, [facility] FPTS Intern pager: [phone], text pages welcome Secure chat group [facility] Teaching Service

## 2023-09-17 NOTE — Progress Notes (Signed)
 PHARMACY - ANTICOAGULATION Pharmacy Consult for heparin  Indication: LV thrombus  No Known Allergies  Patient Measurements: Height: 6' (182.9 cm) Weight: 98.1 kg (216 lb 4.3 oz) IBW/kg (Calculated) : 77.6 HEPARIN  DW (KG): 98.9  Vital Signs: Temp: 98.1 F (36.7 C) (07/01 1058) Temp Source: Oral (07/01 1058) BP: 117/69 (07/01 1058) Pulse Rate: 68 (07/01 1058)  Labs: Recent Labs    09/15/23 0344 09/16/23 0348 09/17/23 0058 09/17/23 1154  HGB  --  15.9 14.9  --   HCT  --  46.8 44.7  --   PLT  --  190 208  --   HEPARINUNFRC  --   --  0.55 0.69  CREATININE 0.88 0.99 1.64*  --     Estimated Creatinine Clearance: 53.8 mL/min (A) (by C-G formula based on SCr of 1.64 mg/dL (H)).  Assessment: 68 yo male noted to have suspected LV apical thrombus on ECHO for heparin .  Heparin  level trending up on 1500 units/hr.  Will empirically reduce heparin  rate to maintain goal.  SCr up 1.64 from 0.99.  Cath delayed until 7/2 with hopeful improvement in renal function.  Goal of Therapy:  Heparin  level 0.3-0.7 units/ml Monitor platelets by anticoagulation protocol: Yes   Plan:  Decrease heparin  to 1400 units/hr Continue daily heparin  level and CBC   Lacreshia Bondarenko, Pharm.D., BCPS Clinical Pharmacist  **Pharmacist phone directory can be found on [email or web address] listed under [facility].  09/17/2023 1:44 PM

## 2023-09-17 NOTE — Progress Notes (Signed)
 Physical Therapy Treatment Patient Details Name: Joseph Ortega MRN: [id number] DOB: [birth year] Today's Date: 09/17/2023   History of Present Illness 68 y.o. M adm 09/13/23 w/ several months of BLE edema, acute systolic heart failure. 7/2 planned LHC/RHC. PMHx: Anxiety, Arthritis, Depression, DM, HTN, Hypothyroidism, Sleep apnea, R MCA CVA (left hemi).    PT Comments  Pt pleasant and very willing to mobilize. Pt reports LB edema has prohibited use of AFO at baseline. Pt states he and wife help each other as she mainly uses a WC and plan to move to [facility] in the near future. Pt with slowly progressing ambulation distance, educated for RW use, transfers and mobility. Pt denied up to chair due to discomfort in chair and encouraged mobility with staff.    HR 75   If plan is discharge home, recommend the following: A little help with walking and/or transfers;A little help with bathing/dressing/bathroom;Assist for transportation;Help with stairs or ramp for entrance   Can travel by private vehicle        Equipment Recommendations  None recommended by PT    Recommendations for Other Services       Precautions / Restrictions Precautions Precautions: Fall;Other (comment) Recall of Precautions/Restrictions: Intact Precaution/Restrictions Comments: left hemiparesis, normally uses AFO but has been unable due to edema, left peripheral vision loss Restrictions Weight Bearing Restrictions Per Provider Order: No     Mobility  Bed Mobility Overal bed mobility: Modified Independent Bed Mobility: Supine to Sit, Sit to Supine     Supine to sit: HOB elevated, Used rails     General bed mobility comments: HOB 20 degrees, increased time, use of rail    Transfers Overall transfer level: Needs assistance   Transfers: Sit to/from Stand Sit to Stand: Supervision, From elevated surface           General transfer comment: cues for hand placement, pt performed 5 repeated sit to stands from EOB  with increased time and at times use of momentum to rise    Ambulation/Gait Ambulation/Gait assistance: Contact guard assist Gait Distance (Feet): 100 Feet Assistive device: Rolling walker (2 wheels) Gait Pattern/deviations: Step-through pattern, Decreased step length - left, Decreased stance time - left, Decreased dorsiflexion - left, Decreased weight shift to left   Gait velocity interpretation: <1.8 ft/sec, indicate of risk for recurrent falls   General Gait Details: pt with left foot drop, cues for attending to environment on left due to visual deficits, CGA for safety with one partial posterior LOB able to recover without assist, pt able to self-regulate distance   Stairs             Wheelchair Mobility     Tilt Bed    Modified Rankin (Stroke Patients Only)       Balance Overall balance assessment: Needs assistance Sitting-balance support: No upper extremity supported, Feet supported Sitting balance-Leahy Scale: Good     Standing balance support: Bilateral upper extremity supported, During functional activity Standing balance-Leahy Scale: Poor Standing balance comment: Rw in standing and for gait                            Communication Communication Communication: No apparent difficulties  Cognition Arousal: Alert Behavior During Therapy: WFL for tasks assessed/performed   PT - Cognitive impairments: No apparent impairments                         Following commands: Intact  Cueing Cueing Techniques: Verbal cues  Exercises      General Comments        Pertinent Vitals/Pain Pain Assessment Pain Assessment: No/denies pain    Home Living                          Prior Function            PT Goals (current goals can now be found in the care plan section) Progress towards PT goals: Progressing toward goals    Frequency    Min 1X/week      PT Plan      Co-evaluation              AM-PAC  PT 6 Clicks Mobility   Outcome Measure  Help needed turning from your back to your side while in a flat bed without using bedrails?: None Help needed moving from lying on your back to sitting on the side of a flat bed without using bedrails?: None Help needed moving to and from a bed to a chair (including a wheelchair)?: A Little Help needed standing up from a chair using your arms (e.g., wheelchair or bedside chair)?: A Little Help needed to walk in hospital room?: A Little Help needed climbing 3-5 steps with a railing? : A Little 6 Click Score: 20    End of Session   Activity Tolerance: Patient tolerated treatment well Patient left: in bed;with call bell/phone within reach (pt denied recliner stating discomfort with chair) Nurse Communication: Mobility status PT Visit Diagnosis: Muscle weakness (generalized) (M62.81);Other abnormalities of gait and mobility (R26.89);Hemiplegia and hemiparesis Hemiplegia - Right/Left: Left Hemiplegia - dominant/non-dominant: Non-dominant     Time: 1124-1150 PT Time Calculation (min) (ACUTE ONLY): 26 min  Charges:    $Gait Training: 8-22 mins $Therapeutic Activity: 8-22 mins PT General Charges $$ ACUTE PT VISIT: 1 Visit                     Joseph Ortega, PT Acute Rehabilitation Services Office: [phone]    Joseph Ortega 09/17/2023, 1:44 PM

## 2023-09-17 NOTE — Assessment & Plan Note (Signed)
 Cr 1.67 up from 0.99 yesterday likely 2/2 to diuresis, plus increase in losartan , and GDMT.  -- HCTZ, Jardiance, losartan , diuresis, and spironolactone held -AM BMP

## 2023-09-18 ENCOUNTER — Encounter (HOSPITAL_COMMUNITY)
Admission: EM | Disposition: A | Discharge status: Home Health | Payer: Self-pay | Source: Home / Self Care | Attending: Family Medicine

## 2023-09-18 DIAGNOSIS — I422 Other hypertrophic cardiomyopathy: Secondary | ICD-10-CM

## 2023-09-18 DIAGNOSIS — I2510 Atherosclerotic heart disease of native coronary artery without angina pectoris: Secondary | ICD-10-CM | POA: Diagnosis not present

## 2023-09-18 DIAGNOSIS — N179 Acute kidney failure, unspecified: Secondary | ICD-10-CM | POA: Diagnosis not present

## 2023-09-18 DIAGNOSIS — I5021 Acute systolic (congestive) heart failure: Secondary | ICD-10-CM | POA: Diagnosis not present

## 2023-09-18 DIAGNOSIS — I5020 Unspecified systolic (congestive) heart failure: Secondary | ICD-10-CM | POA: Diagnosis not present

## 2023-09-18 DIAGNOSIS — I5022 Chronic systolic (congestive) heart failure: Secondary | ICD-10-CM | POA: Diagnosis not present

## 2023-09-18 HISTORY — PX: RIGHT HEART CATH AND CORONARY ANGIOGRAPHY: CATH118264

## 2023-09-18 LAB — POCT I-STAT EG7
Acid-Base Excess: 3 mmol/L — ABNORMAL HIGH (ref 0.0–2.0)
Acid-Base Excess: 6 mmol/L — ABNORMAL HIGH (ref 0.0–2.0)
Acid-Base Excess: 6 mmol/L — ABNORMAL HIGH (ref 0.0–2.0)
Bicarbonate: 29.2 mmol/L — ABNORMAL HIGH (ref 20.0–28.0)
Bicarbonate: 32.2 mmol/L — ABNORMAL HIGH (ref 20.0–28.0)
Bicarbonate: 32.6 mmol/L — ABNORMAL HIGH (ref 20.0–28.0)
Calcium, Ion: 1.19 mmol/L (ref 1.15–1.40)
Calcium, Ion: 1.24 mmol/L (ref 1.15–1.40)
Calcium, Ion: 1.25 mmol/L (ref 1.15–1.40)
HCT: 39 % (ref 39.0–52.0)
HCT: 39 % (ref 39.0–52.0)
HCT: 41 % (ref 39.0–52.0)
Hemoglobin: 13.3 g/dL (ref 13.0–17.0)
Hemoglobin: 13.3 g/dL (ref 13.0–17.0)
Hemoglobin: 13.9 g/dL (ref 13.0–17.0)
O2 Saturation: 67 %
O2 Saturation: 70 %
O2 Saturation: 91 %
Potassium: 3.7 mmol/L (ref 3.5–5.1)
Potassium: 3.7 mmol/L (ref 3.5–5.1)
Potassium: 3.9 mmol/L (ref 3.5–5.1)
Sodium: 137 mmol/L (ref 135–145)
Sodium: 141 mmol/L (ref 135–145)
Sodium: 141 mmol/L (ref 135–145)
TCO2: 31 mmol/L (ref 22–32)
TCO2: 34 mmol/L — ABNORMAL HIGH (ref 22–32)
TCO2: 34 mmol/L — ABNORMAL HIGH (ref 22–32)
pCO2, Ven: 49.9 mmHg (ref 44–60)
pCO2, Ven: 52.6 mmHg (ref 44–60)
pCO2, Ven: 53.6 mmHg (ref 44–60)
pH, Ven: 7.375 (ref 7.25–7.43)
pH, Ven: 7.392 (ref 7.25–7.43)
pH, Ven: 7.395 (ref 7.25–7.43)
pO2, Ven: 36 mmHg (ref 32–45)
pO2, Ven: 38 mmHg (ref 32–45)
pO2, Ven: 62 mmHg — ABNORMAL HIGH (ref 32–45)

## 2023-09-18 LAB — BASIC METABOLIC PANEL WITH GFR
Anion gap: 7 (ref 5–15)
BUN: 33 mg/dL — ABNORMAL HIGH (ref 8–23)
CO2: 30 mmol/L (ref 22–32)
Calcium: 8.7 mg/dL — ABNORMAL LOW (ref 8.9–10.3)
Chloride: 99 mmol/L (ref 98–111)
Creatinine, Ser: 1.13 mg/dL (ref 0.61–1.24)
GFR, Estimated: >60 mL/min (ref >60)
Glucose, Bld: 147 mg/dL — ABNORMAL HIGH (ref 70–99)
Potassium: 4 mmol/L (ref 3.5–5.1)
Sodium: 136 mmol/L (ref 135–145)

## 2023-09-18 LAB — CBC
HCT: 43 % (ref 39.0–52.0)
HCT: 42.1 % (ref 39.0–52.0)
Hemoglobin: 14.3 g/dL (ref 13.0–17.0)
Hemoglobin: 13.8 g/dL (ref 13.0–17.0)
MCH: 32.1 pg (ref 26.0–34.0)
MCH: 32.1 pg (ref 26.0–34.0)
MCHC: 33.3 g/dL (ref 30.0–36.0)
MCHC: 32.8 g/dL (ref 30.0–36.0)
MCV: 96.6 fL (ref 80.0–100.0)
MCV: 97.9 fL (ref 80.0–100.0)
Platelets: 179 10*3/uL (ref 150–400)
Platelets: 164 10*3/uL (ref 150–400)
RBC: 4.45 MIL/uL (ref 4.22–5.81)
RBC: 4.3 MIL/uL (ref 4.22–5.81)
RDW: 14.6 % (ref 11.5–15.5)
RDW: 14.6 % (ref 11.5–15.5)
WBC: 7.4 10*3/uL (ref 4.0–10.5)
WBC: 6.7 10*3/uL (ref 4.0–10.5)
nRBC: 0 % (ref 0.0–0.2)
nRBC: 0 % (ref 0.0–0.2)

## 2023-09-18 LAB — HEPARIN LEVEL (UNFRACTIONATED): Heparin Unfractionated: 0.43 [IU]/mL (ref 0.30–0.70)

## 2023-09-18 LAB — CREATININE, SERUM
Creatinine, Ser: 0.86 mg/dL (ref 0.61–1.24)
GFR, Estimated: >60 mL/min (ref >60)

## 2023-09-18 LAB — GLUCOSE, CAPILLARY
Glucose-Capillary: 161 mg/dL — ABNORMAL HIGH (ref 70–99)
Glucose-Capillary: 136 mg/dL — ABNORMAL HIGH (ref 70–99)
Glucose-Capillary: 191 mg/dL — ABNORMAL HIGH (ref 70–99)
Glucose-Capillary: 195 mg/dL — ABNORMAL HIGH (ref 70–99)

## 2023-09-18 LAB — MAGNESIUM: Magnesium: 2.2 mg/dL (ref 1.7–2.4)

## 2023-09-18 SURGERY — RIGHT HEART CATH AND CORONARY ANGIOGRAPHY
Anesthesia: LOCAL

## 2023-09-18 MED ORDER — SODIUM CHLORIDE 0.9 % IV SOLN: 250.0000 mL | INTRAVENOUS | Status: AC | PRN

## 2023-09-18 MED ORDER — SODIUM CHLORIDE 0.9% FLUSH
3.0000 mL | Freq: Two times a day (BID) | INTRAVENOUS | Status: DC
  Administered 2023-09-18 – 2023-09-19 (×2): 3 mL via INTRAVENOUS

## 2023-09-18 MED ORDER — VERAPAMIL HCL 2.5 MG/ML IV SOLN
INTRAVENOUS | Status: AC
  Filled 2023-09-18: qty 2

## 2023-09-18 MED ORDER — HEPARIN SODIUM (PORCINE) 1000 UNIT/ML IJ SOLN
INTRAMUSCULAR | Status: DC | PRN
  Administered 2023-09-18: 5000 [IU] via INTRAVENOUS

## 2023-09-18 MED ORDER — HEPARIN SODIUM (PORCINE) 5000 UNIT/ML IJ SOLN
5000.0000 [IU] | Freq: Three times a day (TID) | INTRAMUSCULAR | Status: DC
  Administered 2023-09-18 – 2023-09-19 (×2): 5000 [IU] via SUBCUTANEOUS
  Filled 2023-09-18 (×3): qty 1

## 2023-09-18 MED ORDER — HYDRALAZINE HCL 20 MG/ML IJ SOLN
10.0000 mg | INTRAMUSCULAR | Status: AC | PRN
Start: 2023-09-18 — End: 2023-09-18

## 2023-09-18 MED ORDER — HEPARIN SODIUM (PORCINE) 1000 UNIT/ML IJ SOLN
INTRAMUSCULAR | Status: AC
  Filled 2023-09-18: qty 10

## 2023-09-18 MED ORDER — ASPIRIN 81 MG PO CHEW
81.0000 mg | CHEWABLE_TABLET | ORAL | Status: AC
  Administered 2023-09-18: 81 mg via ORAL
  Filled 2023-09-18: qty 1

## 2023-09-18 MED ORDER — SODIUM CHLORIDE 0.9% FLUSH: 3.0000 mL | INTRAVENOUS | Status: DC | PRN

## 2023-09-18 MED ORDER — LIDOCAINE HCL (PF) 1 % IJ SOLN
INTRAMUSCULAR | Status: DC | PRN
  Administered 2023-09-18 (×2): 2 mL via INTRADERMAL

## 2023-09-18 MED ORDER — VERAPAMIL HCL 2.5 MG/ML IV SOLN
INTRAVENOUS | Status: DC | PRN
  Administered 2023-09-18: 10 mL via INTRA_ARTERIAL

## 2023-09-18 MED ORDER — ONDANSETRON HCL 4 MG/2ML IJ SOLN: 4.0000 mg | Freq: Four times a day (QID) | INTRAMUSCULAR | Status: DC | PRN

## 2023-09-18 MED ORDER — IOHEXOL 350 MG/ML SOLN
INTRAVENOUS | Status: DC | PRN
  Administered 2023-09-18: 20 mL

## 2023-09-18 MED ORDER — LABETALOL HCL 5 MG/ML IV SOLN: 10.0000 mg | INTRAVENOUS | Status: AC | PRN

## 2023-09-18 MED ORDER — ENOXAPARIN SODIUM 40 MG/0.4ML IJ SOSY: 40.0000 mg | PREFILLED_SYRINGE | INTRAMUSCULAR | Status: DC

## 2023-09-18 MED ORDER — LIDOCAINE HCL (PF) 1 % IJ SOLN
INTRAMUSCULAR | Status: AC
Start: 2023-09-18 — End: 2023-09-18
  Filled 2023-09-18: qty 30

## 2023-09-18 MED ORDER — HEPARIN (PORCINE) IN NACL 1000-0.9 UT/500ML-% IV SOLN
INTRAVENOUS | Status: DC | PRN
  Administered 2023-09-18 (×2): 500 mL

## 2023-09-18 SURGICAL SUPPLY — 10 items
CATH BALLN WEDGE 5F 110CM (CATHETERS) IMPLANT
CATH INFINITI 5FR MULTPACK ANG (CATHETERS) IMPLANT
DEVICE RAD COMP TR BAND LRG (VASCULAR PRODUCTS) IMPLANT
GLIDESHEATH SLEND SS 6F .021 (SHEATH) IMPLANT
GUIDEWIRE .025 260CM (WIRE) IMPLANT
GUIDEWIRE INQWIRE 1.5J.035X260 (WIRE) IMPLANT
PACK CARDIAC CATHETERIZATION (CUSTOM PROCEDURE TRAY) ×1 IMPLANT
SET ATX-X65L (MISCELLANEOUS) IMPLANT
SHEATH GLIDE SLENDER 4/5FR (SHEATH) IMPLANT
SHEATH PROBE COVER 6X72 (BAG) IMPLANT

## 2023-09-18 NOTE — Plan of Care (Signed)

## 2023-09-18 NOTE — Progress Notes (Signed)
 Daily Progress Note Intern Pager: [phone]  Patient name: Joseph Ortega Medical record number: [id number] Date of birth: 1955/05/08 Age: 68 y.o. Gender: male  Primary Care Provider: Howell Lunger, DO Consultants: Cardiology Code Status: Full Code  Pt Overview and Major Events to Date:  6/27: admitted   Medical Decision Making:  Joseph Ortega is a 68 y.o. male admitted for HFmrEF exacerbation. Repeat echo showed new LV wall abnormality. Cardiology consulted, recommended LHC/RHC. Heart cath delayed 2/2 new AKI likely 2/2 diuresis and GDMT. GDTM held pending Cr improvement.   Pertinent PMH/PSH includes T2DM, HTN, HLD, prior CVA, OSA, hypothyroidism, and anxiety.  Assessment & Plan Heart failure with mildly reduced ejection fraction (HFmrEF) (HCC) Acute systolic heart failure (HCC) HDS on RA. LHC/RHC today. -D/C'd: Torsemide 20 mg 2/2 AKI - Continue home Coreg  12.5 mg BID - Avoid SGLT2i given propensity for peripheral vascular complications per cards and concern for UTI symptoms/yeast w/ A1c - Monitor electrolytes w/ AM BMP & Mag - AM CBC  - Echo with contrast per cardiology showed LVEF 35-40% - Augment GDMT as able - Cardiology consulted, appreciate recommendations  - Cardiac MRI results showed:    - LGE consistent with prior infarct, severe LV hypertrophy, moderate pericardial effusion, LVEF 44%, dilated pulmonary artery, no LV thrombus seen - LPA AKI (acute kidney injury) (HCC) Cr 1.67 --> 1.13  -- HCTZ, Jardiance, losartan , diuresis, and spironolactone held - AM BMP Abnormal EKG Repeated today.  - repeat ECG today  LV (left ventricular) mural thrombus (Resolved: 09/18/2023) Cardiac MRI 7/1 ruled out thrombus Chronic health problem T2DM: Takes 40U LAI daily, SSI with meals, and was previously on Trulicity  3 mg weekly.  Last A1c 11.5 in 2/25.  CBGs improved this AM, will decreased Semglee  to 35u daily and moderate SSI.   HTN: Management as above Hypothyroidism:  TSH elevated to 10.157, reduced dose of Synthroid  to 100 mcg.  Follow-up outpatient in 4 weeks. HLD: Home Crestor  40 mg daily Anxiety/depression: BuSpar  10 mg TID, Cymbalta  30 mg BID History of CVA: ASA 81mg  daily, gabapentin  300 mg TID OSA: nightly CPAP GERD: Famotidine 20 mg daily to help   FEN/GI: Heart healthy/carb modified PPx: Lovenox  Dispo:Home with home health, now s/p heart cath awaiting cardiology recommendations.  Subjective:  Patient was seen lying comfortably in bed today. He has no concerns or complaints at this time.   Objective: Temp:  [97.8 F (36.6 C)-98.1 F (36.7 C)] 97.8 F (36.6 C) (07/02 0406) Pulse Rate:  [61-77] 61 (07/02 0406) Resp:  [18-20] 18 (07/02 0406) BP: (117-159)/(65-77) 152/77 (07/02 0406) SpO2:  [90 %-98 %] 97 % (07/02 0406) FiO2 (%):  [21 %] 21 % (07/01 2244) Weight:  [98 kg] 98 kg (07/02 0406) Physical Exam: General: awake, alert, NAD Cardiovascular: RRR, no M/R/G Respiratory: CTAB, no wheezing, rhonchi, or rales Abdomen: soft, flat, non-tender  Extremities: 2+ pitting edema of the left lower extremity, 1+ pitting edema on the right lower extremity  Laboratory: Most recent CBC Lab Results  Component Value Date   WBC 7.4 09/18/2023   HGB 14.3 09/18/2023   HCT 43.0 09/18/2023   MCV 96.6 09/18/2023   PLT 179 09/18/2023   Most recent BMP    Latest Ref Rng & Units 09/18/2023    2:47 AM  BMP  Glucose 70 - 99 mg/dL 852   BUN 8 - 23 mg/dL 33   Creatinine 9.38 - 1.24 mg/dL 8.86   Sodium 864 - 854 mmol/L 136  Potassium 3.5 - 5.1 mmol/L 4.0   Chloride 98 - 111 mmol/L 99   CO2 22 - 32 mmol/L 30   Calcium  8.9 - 10.3 mg/dL 8.7      Imaging/Diagnostic Tests: Cardiac MRI: Subendocardial LGE consistent with prior infarct. Severe asymmetric LV hypertrophy, meeting criteria for hypertrophic cardiomyopathy.  Mild systolic dysfunction LV (EF 44%), normal RV function.  Moderate pericardial effusion.  Dilated main pulmonary artery.  No LV  thrombus seen.  Joseph Raguel MATSU, DO 09/18/2023, 7:29 AM  PGY-1, [facility] FPTS Intern pager: [phone], text pages welcome Secure chat group [facility] Teaching Service

## 2023-09-18 NOTE — Progress Notes (Signed)
   Heart Failure Stewardship Pharmacist Progress Note   PCP: Howell Lunger, DO PCP-Cardiologist: Lynwood Schilling, MD    HPI:  68 yo M with PMH of stroke, chronic lymphedema, T2DM, chronic venous insufficiency, HTN, and HLD. Has been noncompliant with his medications and diet.   Presented to the ED on 6/26 with bilateral lower leg pain and edema x 2 months. CXR with no active cardiopulmonary disease. ECHO 6/27 with LVEF 45-50%, severe LVH, G2DD, RV normal - limited visualization. Cardiology was consulted. Repeat ECHO on 6/30 with contrast showed LVEF 35-40%, severe LVH, RV normal, small pericardial effusion present. Recommended cMRI to evaluate for infiltrative cardiomyopathy. cMRI completed on 7/1 - LVEF 44%, subendocardial LGE consistent with prior infarct in basal inferoseptal, mid inferoseptal/anteroseptal, apical anterior/septal walls and apex. LGE is less than 50% transmural suggesting viability in all walls except the apical septum and apex. No LV thrombus seen. Taken for [facility] on 7/2 and found severe multivessel disease with occluded prox RCA and diffusely diseased LAD. RA 8, PA 26, wedge 12, CO 7.5, CI 3.4. In absence of ACS or chest pain - no role for PCI or any good targets in LAD or RCA. Recommended medical management.   Denies shortness of breath. LE edema improved, no LE edema on exam. Reviewed changes to GDMT. No new questions at this time. Agreeable to using [facility] TOC pharmacy at discharge.    Current HF Medications: Beta Blocker: carvedilol  12.5 mg BID  Prior to admission HF Medications: Beta blocker: carvedilol  12.5 mg BID ACE/ARB/ARNI: losartan  25 mg daily *was also taking hydrochlorothiazide  12.5 mg TID  Pertinent Lab Values: Serum creatinine 0.92, BUN 22, Potassium 4.1, Sodium 134, BNP 816, Magnesium 2.1, A1c 11.8   Vital Signs: Weight: 216 lbs (admission weight: 227 lbs) Blood pressure: 120-180/70s  Heart rate: 60-70s  I/O: net -1.1L yesterday; net -15.3L since  admission  Medication Assistance / Insurance Benefits Check: Does the patient have prescription insurance?  Yes Type of insurance plan: [employer]  Outpatient Pharmacy:  Prior to admission [facility] Is the patient willing to use [facility] TOC pharmacy at discharge? Yes Is the patient willing to transition their outpatient pharmacy to utilize a [facility] outpatient pharmacy?   No    Assessment: 1. Acute systolic and diastolic CHF (LVEF 44% per cMRI), due to ICM. NYHA class II symptoms. - Does not appear overtly overloaded on exam, can consider low dose lasix  daily vs PRN for discharge - Continue carvedilol  12.5 mg BID - Consider restarting losartan  25 mg daily - Can consider adding spironolactone and SGLT2i prior to discharge vs at follow up   Plan: 1) Medication changes recommended at this time: - Add lasix  40 mg daily - Restart losartan  25 mg daily  2) Patient assistance: [employer] copay $47 - Jardiance copay $47 Farxiga  brand copay is $253.82 for 30 days, generic not covered   3)  Education  - Patient has been educated on current HF medications and potential additions to HF medication regimen - Patient verbalizes understanding that over the next few months, these medication doses may change and more medications may be added to optimize HF regimen - Patient has been educated on basic disease state pathophysiology and goals of therapy   Duwaine Plant, PharmD, BCPS Heart Failure Stewardship Pharmacist Phone [phone]

## 2023-09-18 NOTE — Progress Notes (Signed)
  Progress Note  Patient Name: Joseph Ortega Date of Encounter: 09/18/2023 [facility] Cardiologist: Lynwood Schilling, MD   Interval Summary   Diuretics held, creatinine improved to 1.1 today.  Denies any chest pain or dyspnea  Vital Signs Vitals:   09/17/23 2244 09/18/23 0048 09/18/23 0406 09/18/23 0727  BP:  (!) 159/75 (!) 152/77 137/67  Pulse: 68 62 61 66  Resp:  18 18 20   Temp:  97.8 F (36.6 C) 97.8 F (36.6 C) 98.2 F (36.8 C)  TempSrc:  Oral Oral Oral  SpO2: 95% 98% 97% 95%  Weight:   98 kg   Height:        Intake/Output Summary (Last 24 hours) at 09/18/2023 0944 Last data filed at 09/18/2023 0700 Gross per 24 hour  Intake 749.74 ml  Output 2200 ml  Net -1450.26 ml      09/18/2023    4:06 AM 09/17/2023    3:30 AM 09/16/2023    5:23 AM  Last 3 Weights  Weight (lbs) 216 lb 0.8 oz 216 lb 4.3 oz 215 lb 6.4 oz  Weight (kg) 98 kg 98.1 kg 97.705 kg     Telemetry/ECG  Sinus rhythm- Personally Reviewed  Physical Exam  GEN: No acute distress.   Neck: No JVD Cardiac: RRR, no murmurs, rubs, or gallops.  Respiratory: Clear to auscultation bilaterally. GI: Soft, nontender, non-distended  MS: No edema  Assessment & Plan  68 y.o. male with a hx of HTN diabetes mellitus who is being seen for the evaluation of lower extremity swelling and a reduced ejection fraction   Acute HFrEF Hypertrophic cardiomyopathy Possible LV thrombus per echo  Echo 02/2023: LVEF 55-60%, severe LVH, G1DD Echo 08/2023: LVEF 45-50%, G2DD, normal RV, dilated IVC, small PFO Echo this admission: LVEF 35-40%, aneurysmal LV apex CMR: Severe asymmetric LVH measuring 21 mm in basal septum, meeting criteria for HCM.  LVEF 44%, RVEF 54%.  Subendocardial LGE consistent with prior infarct in basal inferoseptal, mid inferoseptal/anteroseptal, apical anterior/septal walls and apex.  Nonviable in apical septum and apex.  No LV thrombus seen BNP 816 Net -13 L this admission  Most recent BP 106/60 with HR  52  Continue strict I&O's, daily weights, daily BMPs Continue on CoReg  12.5 mg BID Can discontinue IV heparin  as no LV thrombus seen on CMR Held losartan , spironolactone, torsemide, hydrochlorothiazide , Jardiance due to AKI on 7/1.  Creatinine has improved to normal.  Appears euvolemic on exam Plan LHC/RHC today.  Risks and benefits of cardiac catheterization have been discussed with the patient.  These include bleeding, infection, kidney damage, stroke, heart attack, death.  The patient understands these risks and is willing to proceed. Will add back GDMT as tolerated after cath   For questions or updates, please contact [facility] Please consult [email or web address] for contact info under       Signed, Lonni LITTIE Nanas, MD

## 2023-09-18 NOTE — Progress Notes (Signed)
 Mobility Specialist Progress Note:    09/18/23 1556  Mobility  Activity Transferred to/from [facility];Ambulated with assistance in room  Level of Assistance Contact guard assist, steadying assist  Assistive Device Front wheel walker  Distance Ambulated (ft) 5 ft  Activity Response Tolerated well  Mobility Referral Yes  Mobility visit 1 Mobility  Mobility Specialist Start Time (ACUTE ONLY) 1556  Mobility Specialist Stop Time (ACUTE ONLY) 1630  Mobility Specialist Time Calculation (min) (ACUTE ONLY) 34 min   Pt agreeable to session. Pt was feeling unmotivated due to news received from doctor limiting session. Pt able to stand self from bed and BSC w/ CGA. MinA to go from Supine to Sit. No c/o symptoms. Pt left in bed feeling better, comfortable, and all needs met.   Venetia Keel Mobility Specialist Please Neurosurgeon or Rehab Office at [phone]

## 2023-09-18 NOTE — Progress Notes (Signed)
   09/18/23 2220  BiPAP/CPAP/SIPAP  $ Non-Invasive Ventilator  Non-Invasive Vent Subsequent  BiPAP/CPAP/SIPAP Pt Type Adult  BiPAP/CPAP/SIPAP Resmed  Mask Type Nasal mask  Dentures removed? No  Mask Size Medium  Respiratory Rate 18 breaths/min  Flow Rate 3 lpm  Patient Home Machine No  Patient Home Mask No  Patient Home Tubing No  Auto Titrate Yes  CPAP/SIPAP surface wiped down Yes  Device Plugged into RED Power Outlet Yes  BiPAP/CPAP /SiPAP Vitals  Bilateral Breath Sounds Diminished;Clear

## 2023-09-18 NOTE — Plan of Care (Signed)
 FMTS Interim Progress Note  S:Went to see patient post-op following cardiac cath. He had no complaints of chest pain, shortness of breath, or other physical concerns. He was a bit disappointed there was no target that could be fixed in his cath.  O: BP (!) 118/59 (BP Location: Right Arm)   Pulse 61   Temp 98.7 F (37.1 C) (Oral)   Resp 20   Ht 6' (1.829 m)   Wt 98 kg   SpO2 97%   BMI 29.30 kg/m    General: Pt sitting up in bed, no acute distress. Cardiovascular: Regular rate and rhythm, no murmurs/rubs/gallops. Respiratory: Clear to auscultation bilaterally, normal work of breathing on room air. No wheezes, crackles. Extremities: Multiple small wounds of bilateral legs; minimal edema  A/P: HFrEF  CAD Pt doing well post-cath. Discussed cath findings briefly with patient re: no targets for PCI. Emphasized importance of medical management. Patient verbalized understanding. - No further changes/interventions indicated at this time  Larraine Palma, MD 09/18/2023, 5:59 PM PGY-1, [facility] Service pager [phone]

## 2023-09-18 NOTE — Assessment & Plan Note (Addendum)
 HDS on RA. LHC/RHC today. -D/C'd: Torsemide 20 mg 2/2 AKI - Continue home Coreg  12.5 mg BID - Avoid SGLT2i given propensity for peripheral vascular complications per cards and concern for UTI symptoms/yeast w/ A1c - Monitor electrolytes w/ AM BMP & Mag - AM CBC  - Echo with contrast per cardiology showed LVEF 35-40% - Augment GDMT as able - Cardiology consulted, appreciate recommendations  - Cardiac MRI results showed:    - LGE consistent with prior infarct, severe LV hypertrophy, moderate pericardial effusion, LVEF 44%, dilated pulmonary artery, no LV thrombus seen - LPA

## 2023-09-18 NOTE — Assessment & Plan Note (Addendum)
 Cr 1.67 --> 1.13  -- HCTZ, Jardiance, losartan , diuresis, and spironolactone held - AM BMP

## 2023-09-18 NOTE — Interval H&P Note (Signed)
 History and Physical Interval Note:  09/18/2023 10:21 AM  Joseph Ortega  has presented today for surgery, with the diagnosis of heart failure.  The various methods of treatment have been discussed with the patient and family. After consideration of risks, benefits and other options for treatment, the patient has consented to  Procedure(s): RIGHT/LEFT HEART CATH AND CORONARY ANGIOGRAPHY (N/A) as a surgical intervention.  The patient's history has been reviewed, patient examined, no change in status, stable for surgery.  I have reviewed the patient's chart and labs.  Questions were answered to the patient's satisfaction.     Lashina Milles J Saysha Menta

## 2023-09-18 NOTE — Assessment & Plan Note (Signed)
 T2DM: Takes 40U LAI daily, SSI with meals, and was previously on Trulicity  3 mg weekly.  Last A1c 11.5 in 2/25.  CBGs improved this AM, will decreased Semglee  to 35u daily and moderate SSI.   HTN: Management as above Hypothyroidism: TSH elevated to 10.157, reduced dose of Synthroid  to 100 mcg.  Follow-up outpatient in 4 weeks. HLD: Home Crestor  40 mg daily Anxiety/depression: BuSpar  10 mg TID, Cymbalta  30 mg BID History of CVA: ASA 81mg  daily, gabapentin  300 mg TID OSA: nightly CPAP GERD: Famotidine 20 mg daily to help

## 2023-09-18 NOTE — Progress Notes (Signed)
 PHARMACY - ANTICOAGULATION Pharmacy Consult for heparin  Indication: LV thrombus  No Known Allergies  Patient Measurements: Height: 6' (182.9 cm) Weight: 98 kg (216 lb 0.8 oz) IBW/kg (Calculated) : 77.6 HEPARIN  DW (KG): 98.9  Vital Signs: Temp: 98.2 F (36.8 C) (07/02 0727) Temp Source: Oral (07/02 0727) BP: 137/67 (07/02 0727) Pulse Rate: 66 (07/02 0727)  Labs: Recent Labs    09/16/23 0348 09/17/23 0058 09/17/23 1154 09/18/23 0247  HGB 15.9 14.9  --  14.3  HCT 46.8 44.7  --  43.0  PLT 190 208  --  179  HEPARINUNFRC  --  0.55 0.69 0.43  CREATININE 0.99 1.64*  --  1.13    Estimated Creatinine Clearance: 78 mL/min (by C-G formula based on SCr of 1.13 mg/dL).  Assessment: 68 yo male noted to have suspected LV apical thrombus on ECHO for heparin .  Heparin  level therapeutic on 1400 units/hr.  SCr improved 1.13.  Plan for cath 7/2.  Goal of Therapy:  Heparin  level 0.3-0.7 units/ml Monitor platelets by anticoagulation protocol: Yes   Plan:  Continue heparin  to 1400 units/hr Continue daily heparin  level and CBC Follow-up plans for anticoagulation post-cath   Demarrion Meiklejohn, Pharm.D., BCPS Clinical Pharmacist  **Pharmacist phone directory can be found on [email or web address] listed under [facility].  09/18/2023 8:31 AM

## 2023-09-18 NOTE — Assessment & Plan Note (Signed)
 Cardiac MRI 7/1 ruled out thrombus

## 2023-09-18 NOTE — Progress Notes (Signed)
   09/18/23 1342  Spiritual Encounters  Type of Visit Initial  Care provided to: Patient  Conversation partners present during encounter Nurse  Reason for visit Advance directives  OnCall Visit No  Spiritual Framework  Presenting Themes Values and beliefs;Significant life change  Community/Connection Family  Patient Stress Factors Health changes;Major life changes  Interventions  Spiritual Care Interventions Made Prayer;Compassionate presence;Established relationship of care and support;Normalization of emotions  Intervention Outcomes  Outcomes Awareness of support;Awareness of health;Reduced anxiety;Awareness around self/spiritual resourses  Advance Directives (For Healthcare)  Would patient like information on creating a medical advance directive? No - Patient declined   Chaplain visited Pt.to discuss the Advance Directive (A.D.) Pt.informed the chaplain that his wife is the POA and that the AD does not need to be completed at this time.   Chaplain offered a word of prayer  and shared a scripture.   Hebrews 13:5 I will never leave you nor forsake you  Pt. Expressed gratitude and appeared uplifted by the visit.

## 2023-09-18 NOTE — H&P (View-Only) (Signed)
  Progress Note  Patient Name: Joseph Ortega Date of Encounter: 09/18/2023 [facility] Cardiologist: Lynwood Schilling, MD   Interval Summary   Diuretics held, creatinine improved to 1.1 today.  Denies any chest pain or dyspnea  Vital Signs Vitals:   09/17/23 2244 09/18/23 0048 09/18/23 0406 09/18/23 0727  BP:  (!) 159/75 (!) 152/77 137/67  Pulse: 68 62 61 66  Resp:  18 18 20   Temp:  97.8 F (36.6 C) 97.8 F (36.6 C) 98.2 F (36.8 C)  TempSrc:  Oral Oral Oral  SpO2: 95% 98% 97% 95%  Weight:   98 kg   Height:        Intake/Output Summary (Last 24 hours) at 09/18/2023 0944 Last data filed at 09/18/2023 0700 Gross per 24 hour  Intake 749.74 ml  Output 2200 ml  Net -1450.26 ml      09/18/2023    4:06 AM 09/17/2023    3:30 AM 09/16/2023    5:23 AM  Last 3 Weights  Weight (lbs) 216 lb 0.8 oz 216 lb 4.3 oz 215 lb 6.4 oz  Weight (kg) 98 kg 98.1 kg 97.705 kg     Telemetry/ECG  Sinus rhythm- Personally Reviewed  Physical Exam  GEN: No acute distress.   Neck: No JVD Cardiac: RRR, no murmurs, rubs, or gallops.  Respiratory: Clear to auscultation bilaterally. GI: Soft, nontender, non-distended  MS: No edema  Assessment & Plan  68 y.o. male with a hx of HTN diabetes mellitus who is being seen for the evaluation of lower extremity swelling and a reduced ejection fraction   Acute HFrEF Hypertrophic cardiomyopathy Possible LV thrombus per echo  Echo 02/2023: LVEF 55-60%, severe LVH, G1DD Echo 08/2023: LVEF 45-50%, G2DD, normal RV, dilated IVC, small PFO Echo this admission: LVEF 35-40%, aneurysmal LV apex CMR: Severe asymmetric LVH measuring 21 mm in basal septum, meeting criteria for HCM.  LVEF 44%, RVEF 54%.  Subendocardial LGE consistent with prior infarct in basal inferoseptal, mid inferoseptal/anteroseptal, apical anterior/septal walls and apex.  Nonviable in apical septum and apex.  No LV thrombus seen BNP 816 Net -13 L this admission  Most recent BP 106/60 with HR  52  Continue strict I&O's, daily weights, daily BMPs Continue on CoReg  12.5 mg BID Can discontinue IV heparin  as no LV thrombus seen on CMR Held losartan , spironolactone, torsemide, hydrochlorothiazide , Jardiance due to AKI on 7/1.  Creatinine has improved to normal.  Appears euvolemic on exam Plan LHC/RHC today.  Risks and benefits of cardiac catheterization have been discussed with the patient.  These include bleeding, infection, kidney damage, stroke, heart attack, death.  The patient understands these risks and is willing to proceed. Will add back GDMT as tolerated after cath   For questions or updates, please contact  [facility] Please consult [email or web address] for contact info under       Signed, Lonni LITTIE Nanas, MD

## 2023-09-18 NOTE — Assessment & Plan Note (Addendum)
 Repeated today.  - repeat ECG today

## 2023-09-19 ENCOUNTER — Other Ambulatory Visit (HOSPITAL_COMMUNITY): Payer: Self-pay

## 2023-09-19 ENCOUNTER — Encounter (HOSPITAL_COMMUNITY): Payer: Self-pay | Admitting: Cardiology

## 2023-09-19 DIAGNOSIS — I2510 Atherosclerotic heart disease of native coronary artery without angina pectoris: Secondary | ICD-10-CM | POA: Diagnosis not present

## 2023-09-19 DIAGNOSIS — R600 Localized edema: Secondary | ICD-10-CM | POA: Diagnosis not present

## 2023-09-19 DIAGNOSIS — I509 Heart failure, unspecified: Secondary | ICD-10-CM | POA: Diagnosis not present

## 2023-09-19 DIAGNOSIS — I5021 Acute systolic (congestive) heart failure: Secondary | ICD-10-CM | POA: Diagnosis not present

## 2023-09-19 DIAGNOSIS — I422 Other hypertrophic cardiomyopathy: Secondary | ICD-10-CM | POA: Diagnosis not present

## 2023-09-19 LAB — CBC
HCT: 42.7 % (ref 39.0–52.0)
Hemoglobin: 14 g/dL (ref 13.0–17.0)
MCH: 31.9 pg (ref 26.0–34.0)
MCHC: 32.8 g/dL (ref 30.0–36.0)
MCV: 97.3 fL (ref 80.0–100.0)
Platelets: 172 10*3/uL (ref 150–400)
RBC: 4.39 MIL/uL (ref 4.22–5.81)
RDW: 14.4 % (ref 11.5–15.5)
WBC: 6.1 10*3/uL (ref 4.0–10.5)
nRBC: 0 % (ref 0.0–0.2)

## 2023-09-19 LAB — BASIC METABOLIC PANEL WITH GFR
Anion gap: 8 (ref 5–15)
BUN: 22 mg/dL (ref 8–23)
CO2: 27 mmol/L (ref 22–32)
Calcium: 9 mg/dL (ref 8.9–10.3)
Chloride: 99 mmol/L (ref 98–111)
Creatinine, Ser: 0.92 mg/dL (ref 0.61–1.24)
GFR, Estimated: >60 mL/min (ref >60)
Glucose, Bld: 146 mg/dL — ABNORMAL HIGH (ref 70–99)
Potassium: 4.1 mmol/L (ref 3.5–5.1)
Sodium: 134 mmol/L — ABNORMAL LOW (ref 135–145)

## 2023-09-19 LAB — MAGNESIUM: Magnesium: 2.1 mg/dL (ref 1.7–2.4)

## 2023-09-19 LAB — GLUCOSE, CAPILLARY
Glucose-Capillary: 149 mg/dL — ABNORMAL HIGH (ref 70–99)
Glucose-Capillary: 178 mg/dL — ABNORMAL HIGH (ref 70–99)

## 2023-09-19 MED ORDER — LEVOTHYROXINE SODIUM 100 MCG PO TABS
100.0000 ug | ORAL_TABLET | Freq: Every day | ORAL | 0 refills | Status: DC
Start: 2023-09-20 — End: 2024-02-04
  Filled 2023-09-19: qty 30, 30d supply, fill #0

## 2023-09-19 MED ORDER — INSULIN DETEMIR 100 UNIT/ML FLEXPEN: 40.0000 [IU] | PEN_INJECTOR | Freq: Every day | SUBCUTANEOUS | Status: DC

## 2023-09-19 MED ORDER — FUROSEMIDE 40 MG PO TABS
40.0000 mg | ORAL_TABLET | Freq: Every day | ORAL | 0 refills | Status: DC
  Filled 2023-09-19: qty 30, 30d supply, fill #0

## 2023-09-19 MED ORDER — LOSARTAN POTASSIUM 25 MG PO TABS
25.0000 mg | ORAL_TABLET | Freq: Every day | ORAL | Status: DC
  Administered 2023-09-19: 25 mg via ORAL
  Filled 2023-09-19: qty 1

## 2023-09-19 MED ORDER — FUROSEMIDE 40 MG PO TABS
40.0000 mg | ORAL_TABLET | Freq: Every day | ORAL | Status: DC
  Administered 2023-09-19: 40 mg via ORAL
  Filled 2023-09-19: qty 1

## 2023-09-19 NOTE — Assessment & Plan Note (Deleted)
 T2DM: Takes 40U LAI daily, SSI with meals, and was previously on Trulicity  3 mg weekly.  Last A1c 11.5 in 2/25.  CBGs improved this AM, will decreased Semglee  to 35u daily and moderate SSI.   HTN: Management as above Hypothyroidism: TSH elevated to 10.157, reduced dose of Synthroid  to 100 mcg.  Follow-up outpatient in 4 weeks. HLD: Home Crestor  40 mg daily Anxiety/depression: BuSpar  10 mg TID, Cymbalta  30 mg BID History of CVA: ASA 81mg  daily, gabapentin  300 mg TID OSA: nightly CPAP GERD: Famotidine 20 mg daily to help

## 2023-09-19 NOTE — Plan of Care (Signed)
Discharge instructions discussed with patient.  Patient instructed on home medications, restrictions, and follow up appointments. Belongings gathered and sent with patient.  Patients medications picked up from TOC.

## 2023-09-19 NOTE — Progress Notes (Signed)
 The patient was evaluated by me today. No new concerns and no acute findings on physical exam. Lab report, imaging reviewed. Stable for discharge today. Appropriate follow-up scheduled. I will cosign the resident's note once it is completed.

## 2023-09-19 NOTE — Assessment & Plan Note (Deleted)
 Cr 1.13 --> 0.92  -- HCTZ, Jardiance, losartan , diuresis, and spironolactone held - AM BMP

## 2023-09-19 NOTE — Plan of Care (Signed)
   Problem: Fluid Volume: Goal: Ability to maintain a balanced intake and output will improve Outcome: Progressing

## 2023-09-19 NOTE — Progress Notes (Signed)
 Mobility Specialist Progress Note:    09/19/23 0941  Mobility  Activity Ambulated with assistance in hallway  Level of Assistance Contact guard assist, steadying assist  Assistive Device Front wheel walker  Distance Ambulated (ft) 150 ft  Activity Response Tolerated well  Mobility Referral Yes  Mobility visit 1 Mobility  Mobility Specialist Start Time (ACUTE ONLY) 0940  Mobility Specialist Stop Time (ACUTE ONLY) 1008  Mobility Specialist Time Calculation (min) (ACUTE ONLY) 28 min   Pt agreeable to session after motivational talk. Pt elevated bed to stand but moved very well. No c/o symptoms. Pt ambulated very well CGA and RW. Pt left in bed comfortably w/ all needs met.   Venetia Keel Mobility Specialist Please Neurosurgeon or Rehab Office at [phone]

## 2023-09-19 NOTE — Progress Notes (Addendum)
 Progress Note  Patient Name: Joseph Ortega Date of Encounter: 09/19/2023 [facility] Cardiologist: Lynwood Schilling, MD   Interval Summary   Patient resting comfortably in bed  No complaints  Vital Signs Vitals:   09/19/23 0200 09/19/23 0227 09/19/23 0300 09/19/23 0745  BP: (!) 183/84 (!) 177/74 (!) 148/64 123/64  Pulse:    60  Resp:    18  Temp:    97.7 F (36.5 C)  TempSrc:    Oral  SpO2:    99%  Weight:   98 kg   Height:       Intake/Output Summary (Last 24 hours) at 09/19/2023 1042 Last data filed at 09/19/2023 0733 Gross per 24 hour  Intake 603 ml  Output 900 ml  Net -297 ml      09/19/2023    3:00 AM 09/18/2023    4:06 AM 09/17/2023    3:30 AM  Last 3 Weights  Weight (lbs) 216 lb 216 lb 0.8 oz 216 lb 4.3 oz  Weight (kg) 97.977 kg 98 kg 98.1 kg     Telemetry/ECG  Sinus rhythm, HR 70-80s - Personally Reviewed  Physical Exam  GEN: No acute distress.   Neck: No JVD Cardiac: RRR, no murmurs, rubs, or gallops.  Respiratory: Clear to auscultation bilaterally. GI: Soft, nontender, non-distended  MS: No edema  Assessment & Plan  68 y.o. male with a hx of HTN diabetes mellitus who is being seen for the evaluation of lower extremity swelling and a reduced ejection fraction.    Acute HFrEF Hypertrophic cardiomyopathy Mild pulmonary HTN, WHO group 2 Echo 02/2023: LVEF 55-60%, severe LVH, G1DD Echo 08/2023: LVEF 45-50%, G2DD, normal RV, dilated IVC, small PFO Echo this admission: LVEF 35-40%, aneurysmal LV apex CMR: Severe asymmetric LVH measuring 21 mm in basal septum, meeting criteria for HCM.  LVEF 44%, RVEF 54%.  Subendocardial LGE consistent with prior infarct in basal inferoseptal, mid inferoseptal/anteroseptal, apical anterior/septal walls and apex.  Nonviable in apical septum and apex.  No LV thrombus seen BNP 816 Net -15 L this admission  Most recent BP 123/64 with HR 78  LHC/RHC showed: severe multivessel CAD with occluded prox RCA, diffusely diseased  LAD, compensated ischemic/late stage HCM, mild pulmonary HTN, WHO group 2 Continue strict I&O's, daily weights, daily BMPs Continue on CoReg  12.5 mg BID -- AM dose was delayed due to HR 57, up to 70-80s now  Held losartan , spironolactone, torsemide, hydrochlorothiazide , Jardiance due to AKI on 7/1.  Creatinine has improved to normal.  Appears euvolemic on exam Restart home dose 25 mg Losartan  with plans to transition to Entresto in future  Add PO Lasix  40 mg daily Will make close follow up as outpatient   Hyperlipidemia  09/12/2023: ALT 18 09/13/2023: HDL 57; LDL Cholesterol 80  LPA pending  Continue Crestor  40 mg daily   [facility] will sign off.   Medication Recommendations:   Carvedilol  12.5 mg twice daily, losartan  25 mg daily, lasix  40 mg daily, aspirin  81 mg daily, rosuvastatin  40 mg daily Other recommendations (labs, testing, etc):  BMET in 1 week Follow up as an outpatient:  Appointment scheduled 7/16    For questions or updates, please contact [facility] Please consult [email or web address] for contact info under       Signed, Waddell DELENA Donath, PA-C   Patient seen and examined.  Agree with above documentation.  On exam, patient is alert and oriented, regular rate and rhythm, no murmurs, lungs CTAB, no LE edema  or JVD.   Cath yesterday issued severe diffuse stenosis in LAD and occluded RCA, medical management recommended.  He denies any anginal symptoms.  Okay for discharge today from cardiac standpoint.  His renal function has normalized.  Will add back low-dose losartan  today and Lasix  40 mg daily.  Can add additional GDMT as outpatient.  Has follow-up appointment scheduled on 7/16  Lonni LITTIE Nanas, MD

## 2023-09-19 NOTE — Progress Notes (Signed)
 OT Cancellation Note  Patient Details Name: Joseph Ortega MRN: [id number] DOB: [birth year]   Cancelled Treatment:    Reason Eval/Treat Not Completed: Patient declined, no reason specified (Patient stating, I rather not and stating he doesn't feel up to doing anything today. OT to reattempt as schedule permits.)  [name] 09/19/2023, 8:27 AM  [name], OTA Acute Rehabilitation Services  Office [phone]

## 2023-09-19 NOTE — Progress Notes (Signed)
 Heart Failure Nurse Navigator Progress Note  PCP: Howell Lunger, DO PCP-Cardiologist: None Admission Diagnosis: Congestive heart failure Admitted from: Home  Presentation:   Joseph Ortega presented with bilateral lower leg swelling and pain,that has progressed over the last few months. Reported that he has been non compliant with his diabetes medications, has had some ambulation difficulty. 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 Diet/ fluid restrictions, taking all medications as prescribed and attending all medical appointments. Patient verbalized his understanding of education. A HF TOC appointment was scheduled for 09/25/2023 @ 2 pm.   ECHO/ LVEF: 35-40%  Clinical Course:  Past Medical History:  Diagnosis Date   Anxiety    Arthritis    Depression    Diabetes mellitus without complication (HCC)    Hypertensive crisis 02/22/2023   Hypothyroidism    Sleep apnea    wears CPAP   Stroke (HCC)    1 stroke 2/24, and 2 strokes on 03/09/21   Thyroid disease      Social History   Socioeconomic History   Marital status: Married    Spouse name: Not on file   Number of children: 3   Years of education: Not on file   Highest education level: Not on file  Occupational History   Not on file  Tobacco Use   Smoking status: Never   Smokeless tobacco: Never  Vaping Use   Vaping status: Never Used  Substance and Sexual Activity   Alcohol use: No   Drug use: No   Sexual activity: Not on file  Other Topics Concern   Not on file  Social History Narrative   Not on file   Social Drivers of Health   Financial Resource Strain: Not on file  Food Insecurity: No Food Insecurity (09/13/2023)   Hunger  Vital Sign    Worried About Running Out of Food in the Last Year: Never true    Ran Out of Food in the Last Year: Never true  Transportation Needs: No Transportation Needs (09/13/2023)   PRAPARE - Administrator, Civil Service (Medical): No    Lack of Transportation (Non-Medical): No  Physical Activity: Not on file  Stress: Not on file  Social Connections: Moderately Isolated (09/13/2023)   Social Connection and Isolation Panel    Frequency of Communication with Friends and Family: Three times a week    Frequency of Social Gatherings with Friends and Family: Once a week    Attends Religious Services: Never    Database administrator or Organizations: No    Attends Engineer, structural: Never    Marital Status: Married   Water engineer and Provision:  Detailed education and instructions provided on heart failure disease management including the following:  Signs and symptoms of Heart Failure When to call the physician Importance of daily weights Low sodium diet Fluid restriction Medication management Anticipated future follow-up appointments  Patient education given on each of the above topics.  Patient acknowledges understanding via teach back method and acceptance of all instructions.  Education Materials:  Living Better With Heart Failure Booklet, HF zone tool, & Daily Weight Tracker Tool.  Patient has scale at home: No, will buy one  at discharge.  Patient has pill box at home: Yes    High Risk Criteria for Readmission and/or Poor Patient Outcomes: Heart failure hospital admissions (last 6 months): 1  No Show rate: 3 %  Difficult social situation: No, lives with his wife.  Demonstrates medication adherence: No, mainly his diabetes medication.  Primary Language: English  Literacy level: Reading, writing , and comprehension.   Barriers of Care:   Medication compliance Diet/ fluid restrictions Daily weights  Considerations/Referrals:    Referral made to Heart Failure Pharmacist Stewardship: Yes Referral made to Heart Failure CSW/NCM TOC: NA Referral made to Heart & Vascular TOC clinic: Yes, 09/25/2023 @ 2 pm.   Items for Follow-up on DC/TOC: Medication compliance  Diet/ fluid restrictions/ daily weights Continued HF Education.    Stephane Haddock, BSN, Scientist, clinical (histocompatibility and immunogenetics) Only

## 2023-09-19 NOTE — TOC Transition Note (Signed)
 Transition of Care [facility]) - Discharge Note   Patient Details  Name: Joseph Ortega MRN: [id number] Date of Birth: October 10, 1955  Transition of Care [facility]) CM/SW Contact:  Waddell Barnie Rama, RN Phone Number: 09/19/2023, 1:23 PM   Clinical Narrative:    For dc today, patient states he needs ambulance transport home.          Patient Goals and CMS Choice            Discharge Placement                       Discharge Plan and Services Additional resources added to the After Visit Summary for                                       Social Drivers of Health (SDOH) Interventions SDOH Screenings   Food Insecurity: No Food Insecurity (09/13/2023)  Housing: Unknown (09/19/2023)  Transportation Needs: No Transportation Needs (09/19/2023)  Utilities: Not At Risk (09/13/2023)  Alcohol Screen: Low Risk  (09/19/2023)  Depression (PHQ2-9): Low Risk  (04/24/2023)  Financial Resource Strain: Low Risk  (09/19/2023)  Social Connections: Moderately Isolated (09/13/2023)  Tobacco Use: Low Risk  (09/12/2023)     Readmission Risk Interventions    09/16/2023   10:59 AM  Readmission Risk Prevention Plan  Post Dischage Appt Complete  Medication Screening Complete  Transportation Screening Complete

## 2023-09-19 NOTE — Assessment & Plan Note (Deleted)
 Patient 1 day s/p LHC/RHC with no possible PCI interventions at this time. 1.3 L urine output in the last 24 hours.  -D/C'd: Torsemide 20 mg 2/2 AKI - Continue home Coreg  12.5 mg BID - Avoid SGLT2i given propensity for peripheral vascular complications per cards and concern for UTI symptoms/yeast w/ A1c - Monitor electrolytes w/ AM BMP & Mag - AM CBC  - Echo with contrast per cardiology showed LVEF 35-40% - Cardiology consulted, appreciate recommendations  - Managing GDMT

## 2023-09-19 NOTE — Discharge Summary (Incomplete)
 Daily Progress Note Intern Pager: [phone]  Patient name: Joseph Ortega Medical record number: [id number] Date of birth: 15-Apr-1955 Age: 68 y.o. Gender: male  Primary Care Provider: Howell Lunger, DO Consultants: Cardiology Code Status: Full Code  Pt Overview and Major Events to Date:  6/27: Admitted  Medical Decision Making:  Joseph Ortega is a 68 year old male admitted for HFmrEF exacerbation.  Now 1 day status post left and right heart cath with no PCI indicated at this time. Pertinent PMH/PSH includes 2 diabetes, hypertension, HLD, prior CVA, OSA, hypothyroidism, and anxiety.  Assessment & Plan Heart failure with mildly reduced ejection fraction (HFmrEF) (HCC) Acute systolic heart failure (HCC) Patient 1 day s/p LHC/RHC with no possible PCI interventions at this time. 1.3 L urine output in the last 24 hours.  -D/C'd: Torsemide 20 mg 2/2 AKI - Continue home Coreg  12.5 mg BID - Avoid SGLT2i given propensity for peripheral vascular complications per cards and concern for UTI symptoms/yeast w/ A1c - Monitor electrolytes w/ AM BMP & Mag - AM CBC  - Echo with contrast per cardiology showed LVEF 35-40% - Cardiology consulted, appreciate recommendations  - Managing GDMT AKI (acute kidney injury) (HCC) (Resolved: 09/19/2023) Cr 1.13 --> 0.92  -- HCTZ, Jardiance, losartan , diuresis, and spironolactone held - AM BMP Chronic health problem T2DM: Takes 40U LAI daily, SSI with meals, and was previously on Trulicity  3 mg weekly.  Last A1c 11.5 in 2/25.  CBGs improved this AM, will decreased Semglee  to 35u daily and moderate SSI.   HTN: Management as above Hypothyroidism: TSH elevated to 10.157, reduced dose of Synthroid  to 100 mcg.  Follow-up outpatient in 4 weeks. HLD: Home Crestor  40 mg daily Anxiety/depression: BuSpar  10 mg TID, Cymbalta  30 mg BID History of CVA: ASA 81mg  daily, gabapentin  300 mg TID OSA: nightly CPAP GERD: Famotidine 20 mg daily to help   FEN/GI: Heart  healthy/carb modified PPx: Lovenox  Dispo:Home with home health pending clinical improvement . Barriers include cardiology recommendations on GDMT management.   Subjective:  Patient is seen lying in bed this morning.  He has no pain or discomfort at this time.  Patient is disheartened that there are no surgical interventions for his heart at this time.  He is ready to go home.  Objective: Temp:  [97.7 F (36.5 C)-98.7 F (37.1 C)] 97.7 F (36.5 C) (07/03 0745) Pulse Rate:  [0-68] 60 (07/03 0745) Resp:  [15-24] 18 (07/03 0745) BP: (118-183)/(47-90) 123/64 (07/03 0745) SpO2:  [95 %-99 %] 99 % (07/03 0745) Weight:  [98 kg] 98 kg (07/03 0300) Physical Exam: General: awake and alert, NAD Cardiovascular: RRR, no M/R/G Respiratory: clear to auscultation anteriorly, no wheezing, rhonchi, rales. Normal work of breathing on room air Abdomen: soft, flat, non-tender Extremities: mild pitting edema bilaterally   Laboratory: Most recent CBC Lab Results  Component Value Date   WBC 6.1 09/19/2023   HGB 14.0 09/19/2023   HCT 42.7 09/19/2023   MCV 97.3 09/19/2023   PLT 172 09/19/2023   Most recent BMP    Latest Ref Rng & Units 09/19/2023    2:43 AM  BMP  Glucose 70 - 99 mg/dL 853   BUN 8 - 23 mg/dL 22   Creatinine 9.38 - 1.24 mg/dL 9.07   Sodium 864 - 854 mmol/L 134   Potassium 3.5 - 5.1 mmol/L 4.1   Chloride 98 - 111 mmol/L 99   CO2 22 - 32 mmol/L 27   Calcium  8.9 - 10.3 mg/dL 9.0  Imaging/Diagnostic Tests: No new imaging  Joseph Raguel MATSU, DO 09/19/2023, 9:32 AM  PGY-1, [facility] Family Medicine FPTS Intern pager: [phone], text pages welcome Secure chat group [facility] Teaching Service \

## 2023-09-19 NOTE — Discharge Summary (Addendum)
 Family Medicine Teaching [facility] Discharge Summary  Patient name: Joseph Ortega Medical record number: [id number] Date of birth: 09-08-1955 Age: 68 y.o. Gender: male Date of Admission: 09/12/2023  Date of Discharge: 09-19-2023 Admitting Physician: Damien Pinal, DO  Primary Care Provider: Howell Lunger, DO Consultants: Cardiology  Indication for Hospitalization: Inability to take a deep breath and lower extremity swelling left >right  Discharge Diagnoses/Problem List:  Principal Problem for Admission: CHF exacerbation Other Problems addressed during stay:  Principal Problem:   Bilateral edema of lower extremity Active Problems:   Heart failure with mildly reduced ejection fraction (HFmrEF) (HCC)   Chronic health problem   Acute systolic heart failure (HCC)   Abnormal echocardiogram   Hypertrophic cardiomyopathy (HCC)   Coronary artery disease involving native coronary artery of native heart   Congestive heart failure [facility])    Brief Hospital Course:  Joseph Ortega is a 68 y.o.o.male with a history of T2DM, hypertension, hypothyroid, HLD, anxiety, prior CVA, OSA who was admitted to the family medicine teaching Service at [facility] for lateral lower extremity edema found to have HFrEF. His hospital course is detailed below:  HFmrEF Patient presented with several months of L>R b/l LE pitting edema.  MRI tibia/fibula was conducted to rule out infection d/t chronic wounds noted 2/2 chronic venous stasis, negative for osteomyelitis.  With concern for both chronic venous stasis and possible CHF, echocardiogram was performed which demonstrated EF of 45 to 50% however endocardium was not well-visualized, repeat echocardiogram with contrast was ordered by cardiology and showed EF 35-40% with LV aneurysmal appearance and potential thrombus. Cardiology recommended LHC/RHC and CMR. CMR showed subendocardial LGE consistent with prior infarct, severe asymmetric left ventricular hypertrophy,  meeting criteria for hypertrophic cardiomyopathy.  Mild systolic dysfunction LVEF 44%.  Moderate pericardial effusion.  Dilated main pulmonary artery.  No left ventricular thrombus seen.  Heart cath showed severely calcified LAD with moderate diffuse disease.  Proximal occlusion of the RCA. Patient was started on GDMT including Coreg  12.5 mg, HCTZ 25 mg, Cozaar  100 mg daily, and Spironolactone 25 mg with the goal to transition to [place].  All but Coreg  12.5 mg were stopped prior to discharge due to AKI prior to left and right heart cath.  Creatinine returned to baseline and left and right heart cath was performed.  There are no PCI interventions at this time per cardiology.  Patient also had IV diuresis with Lasix  40 mg and output 15.46 liters and 24 pounds from admission.  Dry weight upon discharge 216 pounds.  Patient was not started on SGLT2i given propensity for peripheral vascular complications.  Electrolytes were monitored daily and repleted as appropriate.  Cozaar  25 mg re-started at discharge.  AKI While undergoing diuresis creatinine increased to 1.67 from a baseline of 1.  Newly started GDMT was held and diuresis was held.  Creatinine returned to baseline prior to discharge.  Other chronic conditions were medically managed with home medications and formulary alternatives as necessary (T2DM, HTN, hypothyroid, HLD, anxiety/depression, history of CVA, OSA)  PCP Follow-up Recommendations: Follow-up with cardiology. Add GDMT back as Creatinine and Blood pressure allowed.  Repeat BMP in approximately 1 week from discharge. Recheck TSH in about 4 weeks to assess if Synthroid  100 mcg is an adequate dose for his hypothyroidism. Consider repeating sleep study outpatient to adjust CPAP setting or follow up with pulmonology. Consider outpatient MBS with SLP if concern for decreased ability with swallowing. Consider restarting GLP-1 outpatient, and SGLT2i once A1c improves.  Results/Tests Pending  at Time of Discharge:  Unresulted Labs (From admission, onward)     Start     Ordered   09/20/23 0500  Basic metabolic panel with GFR  Tomorrow morning,   R       Question:  Specimen collection method  Answer:  Lab=Lab collect   09/19/23 1115   09/20/23 0500  Magnesium  Tomorrow morning,   R       Question:  Specimen collection method  Answer:  Lab=Lab collect   09/19/23 1115   09/19/23 0500  Lipoprotein A (LPA)  Tomorrow morning,   R       Question:  Specimen collection method  Answer:  Lab=Lab collect   09/18/23 1147   09/17/23 0500  CBC  Daily,   R     Question:  Specimen collection method  Answer:  Lab=Lab collect   09/16/23 1606             Disposition: Home with home health  Discharge Condition: Patient is medically stable for discharge  Discharge Exam:  Vitals:   09/19/23 0745 09/19/23 1115  BP: 123/64 134/76  Pulse: 60 71  Resp: 18 18  Temp: 97.7 F (36.5 C) 97.8 F (36.6 C)  SpO2: 99% 93%   Physical exam: General: Awake and alert, NAD Cardiovascular: RRR, no M/R/G Respiratory: Clear to auscultation anteriorly, no wheezing, rhonchi, rales.Normal work of breathing on room air Abdomen: Soft, flat, nontender Extremities: Mild pitting edema bilaterally  Significant Procedures:   Coronary angiography 09/18/2023: LM: No significant disease LAD: Severely calcified vessel with moderate diffuse disease throughout the vessel          In addition, ostial calcific 80%, mid focal 80%, followe dby mid diffuse 80% and distal diffuse 99% disease Lcx: Mid 20%, distal 40%, OM3 40% disease RCA: Prox occlusion          Minimal left-to-right and right-to-right collaterals to distal RCA   LVEDP not checked due to presence of LV thrombus (However I was made aware later that MRI did not show thrombus)  Significant Labs and Imaging:  Recent Labs  Lab 09/18/23 0247 09/18/23 1056 09/18/23 1112 09/18/23 1159 09/19/23 0243  WBC 7.4  --   --  6.7 6.1  HGB 14.3   < > 13.3  13.8 14.0  HCT 43.0   < > 39.0 42.1 42.7  PLT 179  --   --  164 172   < > = values in this interval not displayed.   Recent Labs  Lab 09/18/23 0247 09/18/23 1056 09/18/23 1104 09/18/23 1112 09/18/23 1159 09/19/23 0243  NA 136 141 141 137  --  134*  K 4.0 3.7 3.9 3.7  --  4.1  CL 99  --   --   --   --  99  CO2 30  --   --   --   --  27  GLUCOSE 147*  --   --   --   --  146*  BUN 33*  --   --   --   --  22  CREATININE 1.13  --   --   --  0.86 0.92  CALCIUM  8.7*  --   --   --   --  9.0  MG 2.2  --   --   --   --  2.1    Pertinent Imaging   Cardiac MRI: LGE consistent with prior infarct, severe LV hypertrophy, moderate pericardial effusion, LV 44%, dilated  pulmonary artery, no LV thrombus seen Echo: LVEF 35 to 40% severe concentric left ventricular hypertrophy, no evidence of cardiac tamponade, small pericardial effusion  Discharge Medications:  Allergies as of 09/19/2023   No Known Allergies      Medication List     PAUSE taking these medications    guanFACINE 2 MG Tb24 ER tablet Wait to take this until your doctor or other care provider tells you to start again. Commonly known as: INTUNIV Take 2 mg by mouth daily.   hydrochlorothiazide  12.5 MG tablet Wait to take this until your doctor or other care provider tells you to start again. Commonly known as: HYDRODIURIL  Take 1 tablet (12.5 mg total) by mouth 2 (two) times daily. What changed: when to take this   NovoLOG  FlexPen 100 UNIT/ML FlexPen Wait to take this until your doctor or other care provider tells you to start again. Generic drug: insulin  aspart Inject 15-20 Units into the skin 3 (three) times daily with meals. Inject 15-20 units into the skin 3 times daily with meals   potassium chloride  SA 20 MEQ tablet Wait to take this until your doctor or other care provider tells you to start again. Commonly known as: Klor-Con  M20 Take 1 tablet (20 mEq total) by mouth daily.       STOP taking these medications     cyclobenzaprine  10 MG tablet Commonly known as: FLEXERIL    ibuprofen  200 MG tablet Commonly known as: ADVIL        TAKE these medications    acetaminophen  325 MG tablet Commonly known as: TYLENOL  Take 1-2 tablets (325-650 mg total) by mouth every 4 (four) hours as needed for mild pain (pain score 1-3).   Alive Mens Gummy Multivitamins Chew Chew 3 tablets by mouth daily.   ascorbic acid  500 MG tablet Commonly known as: VITAMIN C  Take 1 tablet (500 mg total) by mouth daily with supper.   aspirin  EC 81 MG tablet Take 1 tablet (81 mg total) by mouth daily. Swallow whole.   busPIRone  10 MG tablet Commonly known as: BUSPAR  Take 10 mg by mouth 3 (three) times daily.   carvedilol  12.5 MG tablet Commonly known as: COREG  TAKE 1 TABLET (12.5MG  TOTAL) BY MOUTH TWICE A DAY WITH MEALS   docusate sodium  100 MG capsule Commonly known as: COLACE Take 100 mg by mouth daily.   DULoxetine  30 MG capsule Commonly known as: CYMBALTA  Take 30 mg by mouth 2 (two) times daily.   furosemide  40 MG tablet Commonly known as: LASIX  Take 1 tablet (40 mg total) by mouth daily.   gabapentin  300 MG capsule Commonly known as: Neurontin  Take 1 capsule (300 mg total) by mouth 3 (three) times daily.   insulin  detemir 100 UNIT/ML FlexPen Commonly known as: LEVEMIR  Inject 40 Units into the skin at bedtime.   levothyroxine  100 MCG tablet Commonly known as: SYNTHROID  Take 1 tablet (100 mcg total) by mouth daily at 6 (six) AM. Start taking on: September 20, 2023 What changed:  medication strength how much to take   losartan  25 MG tablet Commonly known as: COZAAR  Take 1 tablet (25 mg total) by mouth at bedtime.   rosuvastatin  40 MG tablet Commonly known as: CRESTOR  Take 1 tablet (40 mg total) by mouth daily.   traZODone  100 MG tablet Commonly known as: DESYREL  Take 100 mg by mouth at bedtime as needed for sleep.   Trulicity  3 MG/0.5ML Soaj Generic drug: Dulaglutide  Inject 3 mg into the skin  once a week. What  changed: additional instructions        Discharge Instructions: Please refer to Patient Instructions section of EMR for full details.  Patient was counseled important signs and symptoms that should prompt return to medical care, changes in medications, dietary instructions, activity restrictions, and follow up appointments.   Follow-Up Appointments:  Follow-up Information     Howell Lunger, DO Follow up.   Specialty: Family Medicine Contact information: [address] [place] [address] [phone]         [facility] Follow up.   Specialty: Home Health Services Why: Agency will call you to set up appt times. Contact information: [address] [place] [address] [phone]         [facility]. Go in 5 day(s).   Specialty: Cardiology Why: Hospital follow up 09/25/2023 @ 2pm PLEASE bring a current medication list to appointments FREE valet parking, Entrance C, Off [employer] and [employer]. Contact information: [address] [place]  [address] [phone]                Ronnald Lee, DO 09/19/2023, 7:27 PM PGY-1, [facility] Family Medicine   I have verified that the resident's  findings are accurately documented in the resident's note. I have made edits and changes where appropriate, and agree with plan.  Ozell Provencal, MD, PGY-3 [facility] 7:32 PM 09/19/2023

## 2023-09-23 LAB — LIPOPROTEIN A (LPA): Lipoprotein (a): 44.6 nmol/L — ABNORMAL HIGH (ref <75.0)

## 2023-09-24 ENCOUNTER — Telehealth (HOSPITAL_COMMUNITY): Payer: Self-pay | Admitting: *Deleted

## 2023-09-25 ENCOUNTER — Encounter (HOSPITAL_COMMUNITY)

## 2023-09-25 ENCOUNTER — Inpatient Hospital Stay: Payer: Self-pay

## 2023-09-25 ENCOUNTER — Telehealth: Payer: Self-pay | Admitting: Pharmacist

## 2023-09-25 NOTE — Telephone Encounter (Signed)
 Attempted to contact patient for follow-up of missed appointment and attempt to reschedule for Diabetes   Left HIPAA compliant voice mail requesting call back to direct phone: [phone]  Total time with patient call and documentation of interaction: 4 minutes.  Follow-up phone call planned: None as I have tried multiple times without success

## 2023-09-26 NOTE — Telephone Encounter (Signed)
 Left voicemail in response to patient call , as to why he had a appt. Reminded him of conversation  09/19/23 @ 9:30 am , when he was on 3 E. Explained  educ I did on HF, meds, diet/ fluid restrictions, etc. gave date and time of appointment on 09/25/23 @ 2 pm.   Patients wife called back and cancelled original appointment for patient.   Stephane Haddock, BSN, Scientist, clinical (histocompatibility and immunogenetics) Only

## 2023-09-30 ENCOUNTER — Encounter: Payer: Self-pay | Admitting: Student

## 2023-09-30 ENCOUNTER — Ambulatory Visit (INDEPENDENT_AMBULATORY_CARE_PROVIDER_SITE_OTHER): Admitting: Student

## 2023-09-30 ENCOUNTER — Ambulatory Visit: Payer: Self-pay | Admitting: Student

## 2023-09-30 VITALS — HR 68 | Ht 72.0 in | Wt 232.2 lb

## 2023-09-30 DIAGNOSIS — R197 Diarrhea, unspecified: Secondary | ICD-10-CM | POA: Diagnosis not present

## 2023-09-30 DIAGNOSIS — I5022 Chronic systolic (congestive) heart failure: Secondary | ICD-10-CM

## 2023-09-30 DIAGNOSIS — N179 Acute kidney failure, unspecified: Secondary | ICD-10-CM | POA: Diagnosis not present

## 2023-09-30 DIAGNOSIS — K921 Melena: Secondary | ICD-10-CM | POA: Diagnosis not present

## 2023-09-30 DIAGNOSIS — E119 Type 2 diabetes mellitus without complications: Secondary | ICD-10-CM

## 2023-09-30 DIAGNOSIS — Z794 Long term (current) use of insulin: Secondary | ICD-10-CM

## 2023-09-30 LAB — POCT HEMOGLOBIN: Hemoglobin: 12.7 g/dL (ref 11–14.6)

## 2023-09-30 MED ORDER — NOVOLOG FLEXPEN 100 UNIT/ML ~~LOC~~ SOPN: 15.0000 [IU] | PEN_INJECTOR | Freq: Three times a day (TID) | SUBCUTANEOUS | 1 refills | Status: DC

## 2023-09-30 NOTE — Progress Notes (Signed)
    SUBJECTIVE:   CHIEF COMPLAINT / HPI:   Hospital follow-up HFmrEF AKI Patient was admitted on 09/12/2023 for heart failure exacerbation and discharged on 09/19/2023.  Patient GDMT was limited 2/2 AKI and reportedly patient was not started on SGLT2i given propensity for peripheral vascular complications.  Currently taking Coreg  12.5 mg daily and Cozaar  25 mg daily, not taking spironolactone ?  Has appointment with cardiology on 10/02/2023. AKI believed prerenal from diuresis, and returned to baseline prior to discharge.  Type 2 diabetes Previously attempted to send to our pharmacy clinic, however missed appointment.  Trulicity  was held during hospital stay.  A1c of 11.8 during hospitalization, worsened.  Reports he is currently taking: Novolog  sliding scale, twice daily (not 3 times daily) Levemir  40 once daily  Acute diarrhea Ever since left hospital patient reports diarrhea, every 10-20 minutes, watery diarrhea. Black stools.   OBJECTIVE:   Pulse 68   Ht 6' (1.829 m)   Wt 232 lb 4 oz (105.3 kg)   SpO2 100%   BMI 31.50 kg/m    General: NAD, pleasant, disheveled Cardio: RRR, no MRG. Cap Refill <2s. Respiratory: CTAB, normal wob on RA GI: Abdomen is soft, not tender, not distended. BS present Skin: Warm and dry  ASSESSMENT/PLAN:   Assessment & Plan Chronic heart failure with mildly reduced ejection fraction (HFmrEF, 41-49%) (HCC) Not in acute exacerbation today.  Doing better since discharge. - Continue losartan /Coreg  - Follow-up with cardiology this week for GDMT - BMP today, add GDMT as renal function allows Acute kidney injury (HCC) Baseline creatinine 0.9-1.1.  Resolved prior to hospital discharge. - BMP today Type 2 diabetes mellitus without complication, with long-term current use of insulin  (HCC) - Continue Levemir  40 units daily - Continue sliding scale NovoLog  3 times daily with meals - Referral to Dr. Amalia pharmacy clinic Acute diarrhea Occurred after  hospitalization. - C. difficile testing Melena - Hemoglobin today - Referral to GI, due for repeat colonoscopy   Follow up recommendations Recheck TSH in 3 weeks, Synthroid  increased during hospital stay Per hospital team: Consider outpatient MBS with SLP if concern for decreased ability with swallowing.   Gladis Church, DO [facility]

## 2023-09-30 NOTE — Patient Instructions (Signed)
 It was great to see you! Thank you for allowing me to participate in your care!   I recommend that you always bring your medications to each appointment as this makes it easy to ensure we are on the correct medications and helps us  not miss when refills are needed.  Our plans for today:  - Please continue to take your medications - You will receive a call to schedule with our pharmacy clinic for diabetes management - follow-up with cardiology - We are checking some labs today, I will call you if they are abnormal will send you a MyChart message or a letter if they are normal.  If you do not hear about your labs in the next 2 weeks please let us  know.  Take care and seek immediate care sooner if you develop any concerns. Please remember to show up 15 minutes before your scheduled appointment time!  Gladis Church, DO [facility]

## 2023-09-30 NOTE — Assessment & Plan Note (Addendum)
-   Continue Levemir  40 units daily - Continue sliding scale NovoLog  3 times daily with meals - Referral to Dr. Amalia pharmacy clinic

## 2023-10-01 LAB — CBC WITH DIFFERENTIAL/PLATELET
Basophils Absolute: 0 x10E3/uL (ref 0.0–0.2)
Basos: 1 %
EOS (ABSOLUTE): 0.1 x10E3/uL (ref 0.0–0.4)
Eos: 2 %
Hematocrit: 39.9 % (ref 37.5–51.0)
Hemoglobin: 12.7 g/dL — ABNORMAL LOW (ref 13.0–17.7)
Immature Grans (Abs): 0 x10E3/uL (ref 0.0–0.1)
Immature Granulocytes: 0 %
Lymphocytes Absolute: 1.1 x10E3/uL (ref 0.7–3.1)
Lymphs: 17 %
MCH: 31.8 pg (ref 26.6–33.0)
MCHC: 31.8 g/dL (ref 31.5–35.7)
MCV: 100 fL — ABNORMAL HIGH (ref 79–97)
Monocytes Absolute: 0.3 x10E3/uL (ref 0.1–0.9)
Monocytes: 5 %
Neutrophils Absolute: 4.7 x10E3/uL (ref 1.4–7.0)
Neutrophils: 74 %
Platelets: 162 x10E3/uL (ref 150–450)
RBC: 3.99 x10E6/uL — ABNORMAL LOW (ref 4.14–5.80)
RDW: 13.7 % (ref 11.6–15.4)
WBC: 6.4 x10E3/uL (ref 3.4–10.8)

## 2023-10-01 LAB — BASIC METABOLIC PANEL WITH GFR
BUN/Creatinine Ratio: 25 — ABNORMAL HIGH (ref 10–24)
BUN: 21 mg/dL (ref 8–27)
CO2: 26 mmol/L (ref 20–29)
Calcium: 9.4 mg/dL (ref 8.6–10.2)
Chloride: 101 mmol/L (ref 96–106)
Creatinine, Ser: 0.85 mg/dL (ref 0.76–1.27)
Glucose: 196 mg/dL — ABNORMAL HIGH (ref 70–99)
Potassium: 4.2 mmol/L (ref 3.5–5.2)
Sodium: 141 mmol/L (ref 134–144)
eGFR: 96 mL/min/1.73 (ref >59)

## 2023-10-01 NOTE — Progress Notes (Deleted)
 Cardiology Office Note    Patient Name: Joseph Ortega Date of Encounter: 10/01/2023  Primary Care Provider:  Howell Lunger, DO Primary Cardiologist:  Lynwood Schilling, MD Primary Electrophysiologist: None   Past Medical History    Past Medical History:  Diagnosis Date   Anxiety    Arthritis    Depression    Diabetes mellitus without complication (HCC)    Hypertensive crisis 02/22/2023   Hypothyroidism    Sleep apnea    wears CPAP   Stroke [facility])    1 stroke 2/24, and 2 strokes on 03/09/21   Thyroid disease     History of Present Illness  Joseph Ortega is a 68 y.o. 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  [name] has a past medical history significant for CVA with last known occurring in 2024 left-sided hemiparesis.  He presented to the ED with complaint of leg pain and was found to have swelling progressively worsening over the past few months with weeping of fluid.  BNP was elevated at 816 and troponins were 18 with hemoglobin A1c of 11.8.  2D echo was completed showing EF of 45 as 50% with severe concentric LVH with small PFO and chordal systolic anterior motion.  He is married to a retired Transport planner and was noted to be noncompliant with medications.  He was started on IV Lasix  with plan to undergo outpatient ischemic evaluation to rule out infiltrative CM.  He completed a limited echo with enhancing agent that showed EF of 35 as 40% with possible thrombus in the LV apex and small pericardial effusion with no evidence of tamponade.  He completed cardiac MRI for further evaluation that showed no LV thrombus but subendocardial LGE consistent with prior infarct in the basal inferior septal/anterior septal/apical region with severe LVH meeting criteria for HCM.  He underwent R/LHC that showed no significant  disease in the left main with severely calcified LAD and occluded proximal RCA with left-to-right and right to left collaterals to distal RCA and no role for PCI and no surgical targets in LAD RCA.  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  GDMT was held due to AKI on 7/1 with improvement to baseline creatinine.  He was restarted on losartan  with plan to transition to Entresto with addition of 40 mg p.o. Lasix .  He was scheduled also for [facility] appointment in HF clinic on 7/9 however was counseled.  Patient denies chest pain, palpitations, dyspnea, PND, orthopnea, nausea, vomiting, dizziness, syncope, edema, weight gain, or early satiety.   Discussed the use of AI scribe software for clinical note transcription with the patient, who gave verbal consent to proceed.  History of Present Illness    ***Notes:   Review of Systems  Please see the history of present illness.    All other systems reviewed and are otherwise negative except as noted above.  Physical Exam    Wt Readings from Last 3 Encounters:  09/30/23 232 lb 4 oz (105.3 kg)  09/19/23 216 lb (98 kg)  06/03/23 236 lb (107 kg)   CD:Uyzmz were no vitals filed for this visit.,There is no height or weight on file to calculate BMI. GEN: Well nourished, well developed in no acute distress Neck: No JVD; No carotid bruits Pulmonary: Clear to auscultation without rales, wheezing or rhonchi  Cardiovascular: Normal rate.  Regular rhythm. Normal S1. Normal S2.   Murmurs: There is no murmur.  ABDOMEN: Soft, non-tender, non-distended EXTREMITIES:  No edema; No deformity   EKG/LABS/ Recent Cardiac Studies   ECG personally reviewed by me today - ***  Risk Assessment/Calculations:   {Does this patient have ATRIAL FIBRILLATION?:[phone]}      Lab Results  Component Value Date   WBC 6.4 09/30/2023   HGB 12.7 (L) 09/30/2023   HCT 39.9 09/30/2023   MCV 100 (H) 09/30/2023   PLT 162 09/30/2023   Lab Results  Component Value  Date   CREATININE 0.85 09/30/2023   BUN 21 09/30/2023   NA 141 09/30/2023   K 4.2 09/30/2023   CL 101 09/30/2023   CO2 26 09/30/2023   Lab Results  Component Value Date   CHOL 144 09/13/2023   HDL 57 09/13/2023   LDLCALC 80 09/13/2023   TRIG 37 09/13/2023   CHOLHDL 2.5 09/13/2023    Lab Results  Component Value Date   HGBA1C 11.8 (H) 09/13/2023   Assessment & Plan    Assessment and Plan Assessment & Plan     1.  HFrEF/HCM  2.  Essential hypertension  3.  Hyperlipidemia  4.  History of CVA  5.  DM type II      Disposition: Follow-up with Lynwood Schilling, MD or APP in *** months {Are you ordering a CV Procedure (e.g. stress test, cath, DCCV, TEE, etc)?   Press F2        :[phone]}   Signed, Wyn Raddle, Jackee Shove, NP 10/01/2023, 11:50 AM [facility]

## 2023-10-02 ENCOUNTER — Ambulatory Visit: Admitting: Nurse Practitioner

## 2023-10-02 DIAGNOSIS — I6310 Cerebral infarction due to embolism of unspecified precerebral artery: Secondary | ICD-10-CM

## 2023-10-02 DIAGNOSIS — I10 Essential (primary) hypertension: Secondary | ICD-10-CM

## 2023-10-02 DIAGNOSIS — I422 Other hypertrophic cardiomyopathy: Secondary | ICD-10-CM

## 2023-10-02 DIAGNOSIS — I5020 Unspecified systolic (congestive) heart failure: Secondary | ICD-10-CM

## 2023-10-02 DIAGNOSIS — E785 Hyperlipidemia, unspecified: Secondary | ICD-10-CM

## 2023-10-02 NOTE — Addendum Note (Signed)
 Addended by: HOWELL LUNGER on: 10/02/2023 08:53 AM   Modules accepted: Orders

## 2023-10-02 NOTE — Telephone Encounter (Signed)
 Called patient x 2, no answer.  Called spouse per does not a party release on file.  Per does need already released, can leave detailed voice message.  Left voice message asking them to call us  back to discuss his results.  Additionally I have messaged them on MyChart.

## 2023-10-12 ENCOUNTER — Other Ambulatory Visit: Payer: Self-pay | Admitting: Physical Medicine & Rehabilitation

## 2023-10-12 DIAGNOSIS — M542 Cervicalgia: Secondary | ICD-10-CM

## 2023-10-12 DIAGNOSIS — M5412 Radiculopathy, cervical region: Secondary | ICD-10-CM

## 2023-10-31 ENCOUNTER — Emergency Department (HOSPITAL_COMMUNITY)

## 2023-10-31 ENCOUNTER — Observation Stay (HOSPITAL_COMMUNITY)
Admission: EM | Admit: 2023-10-31 | Discharge: 2023-11-02 | Disposition: A | Discharge status: Home Health | Attending: Family Medicine | Admitting: Family Medicine

## 2023-10-31 DIAGNOSIS — I2510 Atherosclerotic heart disease of native coronary artery without angina pectoris: Secondary | ICD-10-CM | POA: Insufficient documentation

## 2023-10-31 DIAGNOSIS — E785 Hyperlipidemia, unspecified: Secondary | ICD-10-CM | POA: Diagnosis not present

## 2023-10-31 DIAGNOSIS — I161 Hypertensive emergency: Secondary | ICD-10-CM

## 2023-10-31 DIAGNOSIS — Z7982 Long term (current) use of aspirin: Secondary | ICD-10-CM | POA: Insufficient documentation

## 2023-10-31 DIAGNOSIS — R739 Hyperglycemia, unspecified: Secondary | ICD-10-CM

## 2023-10-31 DIAGNOSIS — E119 Type 2 diabetes mellitus without complications: Secondary | ICD-10-CM

## 2023-10-31 DIAGNOSIS — I10 Essential (primary) hypertension: Secondary | ICD-10-CM | POA: Insufficient documentation

## 2023-10-31 DIAGNOSIS — I6381 Other cerebral infarction due to occlusion or stenosis of small artery: Principal | ICD-10-CM | POA: Insufficient documentation

## 2023-10-31 DIAGNOSIS — R569 Unspecified convulsions: Secondary | ICD-10-CM | POA: Diagnosis not present

## 2023-10-31 DIAGNOSIS — G4089 Other seizures: Secondary | ICD-10-CM | POA: Diagnosis not present

## 2023-10-31 DIAGNOSIS — N179 Acute kidney failure, unspecified: Secondary | ICD-10-CM | POA: Diagnosis not present

## 2023-10-31 DIAGNOSIS — G4733 Obstructive sleep apnea (adult) (pediatric): Secondary | ICD-10-CM | POA: Diagnosis not present

## 2023-10-31 DIAGNOSIS — E039 Hypothyroidism, unspecified: Secondary | ICD-10-CM | POA: Diagnosis not present

## 2023-10-31 DIAGNOSIS — G8192 Hemiplegia, unspecified affecting left dominant side: Secondary | ICD-10-CM | POA: Diagnosis present

## 2023-10-31 DIAGNOSIS — Z794 Long term (current) use of insulin: Secondary | ICD-10-CM | POA: Diagnosis not present

## 2023-10-31 DIAGNOSIS — E11649 Type 2 diabetes mellitus with hypoglycemia without coma: Secondary | ICD-10-CM

## 2023-10-31 DIAGNOSIS — Z789 Other specified health status: Secondary | ICD-10-CM

## 2023-10-31 DIAGNOSIS — G40909 Epilepsy, unspecified, not intractable, without status epilepticus: Secondary | ICD-10-CM

## 2023-10-31 DIAGNOSIS — R531 Weakness: Secondary | ICD-10-CM

## 2023-10-31 LAB — COMPREHENSIVE METABOLIC PANEL WITH GFR
ALT: 10 U/L (ref 0–44)
AST: 20 U/L (ref 15–41)
Albumin: 3.6 g/dL (ref 3.5–5.0)
Alkaline Phosphatase: 119 U/L (ref 38–126)
Anion gap: 11 (ref 5–15)
BUN: 35 mg/dL — ABNORMAL HIGH (ref 8–23)
CO2: 24 mmol/L (ref 22–32)
Calcium: 9.1 mg/dL (ref 8.9–10.3)
Chloride: 100 mmol/L (ref 98–111)
Creatinine, Ser: 1.77 mg/dL — ABNORMAL HIGH (ref 0.61–1.24)
GFR, Estimated: 42 mL/min — ABNORMAL LOW (ref >60)
Glucose, Bld: 492 mg/dL — ABNORMAL HIGH (ref 70–99)
Potassium: 4.2 mmol/L (ref 3.5–5.1)
Sodium: 135 mmol/L (ref 135–145)
Total Bilirubin: 1 mg/dL (ref 0.0–1.2)
Total Protein: 7.2 g/dL (ref 6.5–8.1)

## 2023-10-31 LAB — DIFFERENTIAL
Abs Immature Granulocytes: 0.03 K/uL (ref 0.00–0.07)
Basophils Absolute: 0 K/uL (ref 0.0–0.1)
Basophils Relative: 1 %
Eosinophils Absolute: 0.1 K/uL (ref 0.0–0.5)
Eosinophils Relative: 2 %
Immature Granulocytes: 1 %
Lymphocytes Relative: 14 %
Lymphs Abs: 0.8 K/uL (ref 0.7–4.0)
Monocytes Absolute: 0.4 K/uL (ref 0.1–1.0)
Monocytes Relative: 6 %
Neutro Abs: 4.8 K/uL (ref 1.7–7.7)
Neutrophils Relative %: 76 %

## 2023-10-31 LAB — I-STAT VENOUS BLOOD GAS, ED
Acid-Base Excess: 2 mmol/L (ref 0.0–2.0)
Bicarbonate: 27.8 mmol/L (ref 20.0–28.0)
Calcium, Ion: 1.21 mmol/L (ref 1.15–1.40)
HCT: 39 % (ref 39.0–52.0)
Hemoglobin: 13.3 g/dL (ref 13.0–17.0)
O2 Saturation: 96 %
Potassium: 4.1 mmol/L (ref 3.5–5.1)
Sodium: 138 mmol/L (ref 135–145)
TCO2: 29 mmol/L (ref 22–32)
pCO2, Ven: 46.2 mmHg (ref 44–60)
pH, Ven: 7.388 (ref 7.25–7.43)
pO2, Ven: 82 mmHg — ABNORMAL HIGH (ref 32–45)

## 2023-10-31 LAB — PROTIME-INR
INR: 1.1 (ref 0.8–1.2)
Prothrombin Time: 14.8 s (ref 11.4–15.2)

## 2023-10-31 LAB — I-STAT CHEM 8, ED
BUN: 36 mg/dL — ABNORMAL HIGH (ref 8–23)
Calcium, Ion: 1.14 mmol/L — ABNORMAL LOW (ref 1.15–1.40)
Chloride: 101 mmol/L (ref 98–111)
Creatinine, Ser: 1.7 mg/dL — ABNORMAL HIGH (ref 0.61–1.24)
Glucose, Bld: 495 mg/dL — ABNORMAL HIGH (ref 70–99)
HCT: 43 % (ref 39.0–52.0)
Hemoglobin: 14.6 g/dL (ref 13.0–17.0)
Potassium: 4.2 mmol/L (ref 3.5–5.1)
Sodium: 138 mmol/L (ref 135–145)
TCO2: 27 mmol/L (ref 22–32)

## 2023-10-31 LAB — APTT: aPTT: 32 s (ref 24–36)

## 2023-10-31 LAB — CBC
HCT: 42.4 % (ref 39.0–52.0)
Hemoglobin: 14.2 g/dL (ref 13.0–17.0)
MCH: 31.9 pg (ref 26.0–34.0)
MCHC: 33.5 g/dL (ref 30.0–36.0)
MCV: 95.3 fL (ref 80.0–100.0)
Platelets: 159 K/uL (ref 150–400)
RBC: 4.45 MIL/uL (ref 4.22–5.81)
RDW: 14.1 % (ref 11.5–15.5)
WBC: 6.2 K/uL (ref 4.0–10.5)
nRBC: 0 % (ref 0.0–0.2)

## 2023-10-31 LAB — CBG MONITORING, ED: Glucose-Capillary: 459 mg/dL — ABNORMAL HIGH (ref 70–99)

## 2023-10-31 LAB — ETHANOL: Alcohol, Ethyl (B): <15 mg/dL (ref <15)

## 2023-10-31 MED ORDER — SODIUM CHLORIDE 0.9 % IV BOLUS
500.0000 mL | Freq: Once | INTRAVENOUS | Status: AC
  Administered 2023-11-01: 500 mL via INTRAVENOUS

## 2023-10-31 MED ORDER — LEVETIRACETAM (KEPPRA) 500 MG/5 ML ADULT IV PUSH
500.0000 mg | Freq: Two times a day (BID) | INTRAVENOUS | Status: DC
  Filled 2023-10-31: qty 5

## 2023-10-31 MED ORDER — LEVETIRACETAM (KEPPRA) 500 MG/5 ML ADULT IV PUSH
4500.0000 mg | Freq: Once | INTRAVENOUS | Status: AC
  Administered 2023-10-31: 4500 mg via INTRAVENOUS

## 2023-10-31 MED ORDER — LEVETIRACETAM 500 MG PO TABS
500.0000 mg | ORAL_TABLET | Freq: Two times a day (BID) | ORAL | Status: DC
  Administered 2023-11-01 – 2023-11-02 (×4): 500 mg via ORAL
  Filled 2023-10-31 (×4): qty 1

## 2023-10-31 MED ORDER — MIDAZOLAM HCL 2 MG/2ML IJ SOLN: 4.0000 mg | Freq: Once | INTRAMUSCULAR | Status: AC

## 2023-10-31 MED ORDER — MIDAZOLAM HCL 2 MG/2ML IJ SOLN
INTRAMUSCULAR | Status: AC
Start: 2023-10-31 — End: 2023-10-31
  Administered 2023-10-31: 4 mg via INTRAVENOUS
  Filled 2023-10-31: qty 4

## 2023-10-31 NOTE — Consult Note (Addendum)
 NEUROLOGY CONSULT NOTE   Date of service: October 31, 2023 Patient Name: Quitman Norberto MRN:  [id number] DOB:  [birth year] Chief Complaint: L sided jerking concerning for seizure Requesting Provider: Trine Raynell Moder, *  History of Present Illness  Brondon Vahe Pienta is a 68 y.o. male with hx of prior right MCA infarct with hemorrhagic transformation, diabetes type 2, hypertension, hypothyroidism, sleep apnea who is brought in by EMS as a code stroke for worsening of his baseline left-sided weakness.  Per EMS and patient, yesterday around 1600, patient noted abrupt worsening of his baseline left-sided weakness that lasted for several hours and then resolved around 10 PM last night.  He went to bed and was fine the entire day.  Around 2130, patient was in his wheelchair and got up to let the dogs out.  He was at his baseline when he let the dogs out, however, had immediate significant left-sided weakness.  EMS were called.  Patient was brought in as a code stroke for worsening left-sided weakness, leaning to his left and slurred speech.  Patient was hypertensive to 210 systolic and also hyperglycemic to over 500 for EMS.  While in route, patient developed a left gaze deviation.  Upon arrival to the ED, was noted to have intermittent episodes of left gaze deviation and with subtle forehead twitching on the left side which progressed to involve left facial twitching and left arm stiffening.  Patient was given 4 mg of IV Versed  and loaded with Keppra  60 mg/kg.  Patient had a CT head without contrast which was negative for any acute intracranial normalities.  The twitching had resolved after Versed .  Patient was obtunded afterwards.  But about 30 minutes afterwards, he was noted to be arousable and answer question and follow commands.  LKW: 2130 on 10/31/2023 Modified rankin score: 3-Moderate disability-requires help but walks WITHOUT assistance IV Thrombolysis: Not offered, due to prior noted  history of hemorrhagic transformation of her right MCA stroke.   EVT: Not offered, low suspicion for an LVO.    NIHSS components Score: Comment  1a Level of Conscious 0[x]  1[]  2[]  3[]      1b LOC Questions 0[x]  1[]  2[]       1c LOC Commands 0[x]  1[]  2[]       2 Best Gaze 0[]  1[x]  2[]       3 Visual 0[]  1[x]  2[]  3[]      4 Facial Palsy 0[x]  1[]  2[]  3[]      5a Motor Arm - left 0[]  1[]  2[]  3[]  4[x]  UN[]    5b Motor Arm - Right 0[x]  1[]  2[]  3[]  4[]  UN[]    6a Motor Leg - Left 0[x]  1[]  2[]  3[]  4[]  UN[]    6b Motor Leg - Right 0[x]  1[]  2[]  3[]  4[]  UN[]    7 Limb Ataxia 0[x]  1[]  2[]  UN[]      8 Sensory 0[]  1[x]  2[]  UN[]      9 Best Language 0[x]  1[]  2[]  3[]      10 Dysarthria 0[]  1[x]  2[]  UN[]      11 Extinct. and Inattention 0[x]  1[]  2[]       TOTAL: 8      ROS  Comprehensive ROS performed and pertinent positives documented in HPI   Past History   Past Medical History:  Diagnosis Date   Anxiety    Arthritis    Depression    Diabetes mellitus without complication (HCC)    Hypertensive crisis 02/22/2023   Hypothyroidism    Sleep apnea    wears CPAP   Stroke (HCC)  1 stroke 2/24, and 2 strokes on 03/09/21   Thyroid disease     Past Surgical History:  Procedure Laterality Date   ANTERIOR CERVICAL DECOMP/DISCECTOMY FUSION N/A 11/23/2022   Procedure: Anterior Cervical Decompression Fusion  Cervical four-five;  Surgeon: Lanis Pupa, MD;  Location: [facility] OR;  Service: Neurosurgery;  Laterality: N/A;   GROIN DEBRIDEMENT Left    ingrown hair, possibly abscesses, surgery to treat infection   HERNIA REPAIR Left    inguinal   IR ANGIO INTRA EXTRACRAN SEL COM CAROTID INNOMINATE BILAT MOD SED  03/09/2021   IR ANGIO VERTEBRAL SEL VERTEBRAL BILAT MOD SED  03/09/2021   IR US  GUIDE VASC ACCESS RIGHT  03/09/2021   LOOP RECORDER INSERTION N/A 05/07/2022   Procedure: LOOP RECORDER INSERTION;  Surgeon: Cindie Ole DASEN, MD;  Location: [facility] INVASIVE CV LAB;  Service: Cardiovascular;  Laterality: N/A;    RIGHT HEART CATH AND CORONARY ANGIOGRAPHY N/A 09/18/2023   Procedure: RIGHT HEART CATH AND CORONARY ANGIOGRAPHY;  Surgeon: Elmira Newman PARAS, MD;  Location: [facility] INVASIVE CV LAB;  Service: Cardiovascular;  Laterality: N/A;    Family History: Family History  Problem Relation Age of Onset   Diabetes Father     Social History  reports that he has never smoked. He has never used smokeless tobacco. He reports that he does not currently use alcohol. He reports that he does not use drugs.  No Known Allergies  Medications  No current facility-administered medications for this encounter.  Current Outpatient Medications:    acetaminophen  (TYLENOL ) 325 MG tablet, Take 1-2 tablets (325-650 mg total) by mouth every 4 (four) hours as needed for mild pain (pain score 1-3)., Disp: , Rfl:    ascorbic acid  (VITAMIN C ) 500 MG tablet, Take 1 tablet (500 mg total) by mouth daily with supper., Disp: , Rfl:    aspirin  EC 81 MG tablet, Take 1 tablet (81 mg total) by mouth daily. Swallow whole., Disp: 30 tablet, Rfl: 12   busPIRone  (BUSPAR ) 10 MG tablet, Take 10 mg by mouth 3 (three) times daily., Disp: , Rfl:    carvedilol  (COREG ) 12.5 MG tablet, TAKE 1 TABLET (12.5MG  TOTAL) BY MOUTH TWICE A DAY WITH MEALS, Disp: 180 tablet, Rfl: 2   docusate sodium  (COLACE) 100 MG capsule, Take 100 mg by mouth daily., Disp: , Rfl:    Dulaglutide  (TRULICITY ) 3 MG/0.5ML SOPN, Inject 3 mg into the skin once a week. (Patient taking differently: Inject 3 mg into the skin once a week. Inject on Sunday), Disp: 2 mL, Rfl: 0   DULoxetine  (CYMBALTA ) 30 MG capsule, Take 30 mg by mouth 2 (two) times daily., Disp: , Rfl:    furosemide  (LASIX ) 40 MG tablet, Take 1 tablet (40 mg total) by mouth daily., Disp: 30 tablet, Rfl: 0   gabapentin  (NEURONTIN ) 300 MG capsule, Take 1 capsule (300 mg total) by mouth 3 (three) times daily., Disp: 90 capsule, Rfl: 4   [Paused] guanFACINE (INTUNIV) 2 MG TB24 ER tablet, Take 2 mg by mouth daily., Disp: ,  Rfl:    [Paused] hydrochlorothiazide  (HYDRODIURIL ) 12.5 MG tablet, Take 1 tablet (12.5 mg total) by mouth 2 (two) times daily. (Patient taking differently: Take 12.5 mg by mouth 3 (three) times daily.), Disp: 60 tablet, Rfl: 0   insulin  detemir (LEVEMIR ) 100 UNIT/ML FlexPen, Inject 40 Units into the skin at bedtime., Disp: , Rfl:    levothyroxine  (SYNTHROID ) 100 MCG tablet, Take 1 tablet (100 mcg total) by mouth daily at 6 (six) AM., Disp: 30  tablet, Rfl: 0   losartan  (COZAAR ) 25 MG tablet, Take 1 tablet (25 mg total) by mouth at bedtime., Disp: 90 tablet, Rfl: 3   Multiple Vitamins-Minerals (ALIVE MENS GUMMY MULTIVITAMINS) CHEW, Chew 3 tablets by mouth daily., Disp: , Rfl:    NOVOLOG  FLEXPEN 100 UNIT/ML FlexPen, Inject 15-20 Units into the skin 3 (three) times daily with meals. Inject 15-20 units into the skin 3 times daily with meals, Disp: 15 mL, Rfl: 1   [Paused] potassium chloride  SA (KLOR-CON  M20) 20 MEQ tablet, Take 1 tablet (20 mEq total) by mouth daily., Disp: 30 tablet, Rfl: 0   rosuvastatin  (CRESTOR ) 40 MG tablet, Take 1 tablet (40 mg total) by mouth daily., Disp: 30 tablet, Rfl: 0   traZODone  (DESYREL ) 100 MG tablet, Take 100 mg by mouth at bedtime as needed for sleep., Disp: , Rfl:   Vitals   Vitals:   Nov 19, 2023 2323 2023/11/19 2333  BP: (!) 171/93   Pulse: 83 83  Resp: 16 (!) 22  Temp: 98 F (36.7 C)   SpO2: 100% 100%  Weight: 106.8 kg     Body mass index is 31.93 kg/m.   Physical Exam   General: Laying comfortably in bed; in no acute distress.  HENT: Normal oropharynx and mucosa. Normal external appearance of ears and nose.  Neck: Supple, no pain or tenderness  CV: No JVD. No peripheral edema.  Pulmonary: Symmetric Chest rise. Normal respiratory effort.  Abdomen: Soft to touch, non-tender.  Ext: No cyanosis, edema, or deformity  Skin: No rash. Normal palpation of skin.   Musculoskeletal: Normal digits and nails by inspection. No clubbing.   Neurologic Examination   Mental status/Cognition: Alert, oriented to self, place, month and year, good attention.  Speech/language: Fluent, comprehension intact, object naming intact, repetition intact.  Slurred speech. Cranial nerves:   CN II Pupils equal and reactive to light, inconsistent responses to visual field testing in left Hemi visual field.   CN III,IV,VI Initially had a left gaze preference which later resolved on exam.  No nystagmus.   CN V normal sensation in V1, V2, and V3 segments bilaterally   CN VII no asymmetry, no nasolabial fold flattening   CN VIII normal hearing to speech   CN IX & X normal palatal elevation, no uvular deviation   CN XI 5/5 head turn and 5/5 shoulder shrug bilaterally   CN XII midline tongue protrusion   Motor:  Muscle bulk: Normal, tone flaccid in left upper extremity on arrival. Mvmt Root Nerve  Muscle Right Left Comments  SA C5/6 Ax Deltoid     EF C5/6 Mc Biceps 5 0   EE C6/7/8 Rad Triceps 5 0   WF C6/7 Med FCR     WE C7/8 PIN ECU     F Ab C8/T1 U ADM/FDI 5 0   HF L1/2/3 Fem Illopsoas 5 4   KE L2/3/4 Fem Quad     DF L4/5 D Peron Tib Ant 5 4   PF S1/2 Tibial Grc/Sol 5 4    Sensation:  Light touch Decreased in left upper extremity to touch.   Pin prick    Temperature    Vibration   Proprioception    Coordination/Complex Motor:  - Finger to Nose intact in right upper extremity - Rapid alternating movement are slowed on the right, unable to the left. - Gait: Deferred for patient safety. Labs/Imaging/Neurodiagnostic studies   CBC:  Recent Labs  Lab 2023-11-19 2307 Nov 19, 2023 2311  WBC 6.2  --  NEUTROABS 4.8  --   HGB 14.2 14.6  HCT 42.4 43.0  MCV 95.3  --   PLT 159  --    Basic Metabolic Panel:  Lab Results  Component Value Date   NA 138 10/31/2023   K 4.2 10/31/2023   CO2 24 10/31/2023   GLUCOSE 495 (H) 10/31/2023   BUN 36 (H) 10/31/2023   CREATININE 1.70 (H) 10/31/2023   CALCIUM  9.1 10/31/2023   GFRNONAA 42 (L) 10/31/2023   GFRAA >60  10/14/2019   Lipid Panel:  Lab Results  Component Value Date   LDLCALC 80 09/13/2023   HgbA1c:  Lab Results  Component Value Date   HGBA1C 11.8 (H) 09/13/2023   Urine Drug Screen:     Component Value Date/Time   LABOPIA NONE DETECTED 02/21/2023 2123   COCAINSCRNUR NONE DETECTED 02/21/2023 2123   COCAINSCRNUR NONE DETECTED 03/08/2021 1003   LABBENZ NONE DETECTED 02/21/2023 2123   AMPHETMU NONE DETECTED 02/21/2023 2123   THCU NONE DETECTED 02/21/2023 2123   LABBARB NONE DETECTED 02/21/2023 2123    Alcohol Level     Component Value Date/Time   [facility] <15 10/31/2023 2307   INR  Lab Results  Component Value Date   INR 1.1 10/31/2023   APTT  Lab Results  Component Value Date   APTT 32 10/31/2023   AED levels: No results found for: PHENYTOIN, ZONISAMIDE, LAMOTRIGINE, LEVETIRACETA  CT Head without contrast(Personally reviewed): CTH was negative for a large hypodensity concerning for a large territory infarct or hyperdensity concerning for an ICH.  Stable posterior right MCA distribution encephalomalacia.    ASSESSMENT   Robbie Rideaux is a 68 y.o. male with hx of prior right MCA infarct with hemorrhagic transformation, diabetes type 2, hypertension, hypothyroidism, sleep apnea who is brought in by EMS as a code stroke for worsening of his baseline left-sided weakness.  He had focal seizure with left facial twitching, left arm jerking and left gaze deviation.  The seizure resolved with Versed .  Etiology of his seizure I suspect is likely a combination of prior stroke being the potential focus, hypoglycemia and hypertensive emergency contribution to it.  He is improving and much closer to his baseline.  RECOMMENDATIONS  - Keppra  60mg /Kg IV once, Versed  4mg  IV once - start Keppra  500mg  BID - routine EEG. - observe overnight for seizure clustering. - no driving for 6 months. He has to be seizure free before he can resume  driving. ______________________________________________________________________    Signed, Ellouise Mari, MD [facility]

## 2023-10-31 NOTE — ED Triage Notes (Signed)
 Pt arrives EMS from home with reports of worsening left side weakness that started at 2130 and pt fell shortly after. EMS reports pt has hx of stroke with left side deficits. EMS noted pt to have some dysarthria on arrival. Pt also had left side facial twitching on arrival and was able to speak his name.

## 2023-10-31 NOTE — ED Notes (Signed)
 Neuro with pt

## 2023-10-31 NOTE — ED Provider Notes (Signed)
 [place] [facility] Provider Note  CSN: [id number] Arrival date & time: 10/31/23 2304  Chief Complaint(s) Code Stroke  HPI Joseph Ortega is a 68 y.o. male with a past medical history listed below including hypertension, hyperlipidemia, diabetes, severe multivessel CAD with heart failure with last EF of 35%, and prior hemorrhagic stroke here as a code stroke due to left-sided weakness.  Per EMS, patient had an exacerbation of his left-sided weakness yesterday around 4 PM which resolved around 10 PM.  He was at his baseline, mostly wheelchair-bound status throughout the night and all day today up until this evening around 9:30 PM when he had sudden onset of left-sided weakness while getting up to let the dogs inside the house.  Few minutes prior, patient was able to get up and let the dogs out.  EMS was called since patient fell.  On their arrival, they noted dysarthria and left facial twitching.  HPI  Past Medical History Past Medical History:  Diagnosis Date   Anxiety    Arthritis    Depression    Diabetes mellitus without complication (HCC)    Hypertensive crisis 02/22/2023   Hypothyroidism    Sleep apnea    wears CPAP   Stroke (HCC)    1 stroke 2/24, and 2 strokes on 03/09/21   Thyroid disease    Patient Active Problem List   Diagnosis Date Noted   Coronary artery disease involving native coronary artery of native heart 09/19/2023   Congestive heart failure (HCC) 09/19/2023   Hypertrophic cardiomyopathy (HCC) 09/18/2023   Acute systolic heart failure (HCC) 09/16/2023   Abnormal echocardiogram 09/16/2023   Bilateral edema of lower extremity 09/13/2023   Chronic health problem 09/13/2023   Heart failure with mildly reduced ejection fraction (HFmrEF) (HCC) 05/31/2023   Tinea pedis 03/11/2023   History of peripheral edema 03/11/2023   Current mild episode of major depressive disorder (HCC) 03/04/2023   TBI (traumatic brain injury) (HCC)  02/26/2023   Cervical stenosis of spine 08/15/2022   Hyperlipidemia 05/07/2022   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   Stroke (cerebrum) (HCC) 05/02/2022   ICH (intracerebral hemorrhage) (HCC) 03/17/2021   Hypothyroidism 03/12/2021   OSA (obstructive sleep apnea) 03/12/2021   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   Type 2 diabetes mellitus (HCC) 03/12/2021   Morbid obesity (HCC) 03/11/2021   Essential hypertension 03/11/2021   Tobacco abuse 03/11/2021   Herpes zoster with complication    SAH (subarachnoid hemorrhage) (HCC) 03/08/2021   Home Medication(s) Prior to Admission medications   Medication Sig Start Date End Date Taking? Authorizing Provider  acetaminophen  (TYLENOL ) 325 MG tablet Take 1-2 tablets (325-650 mg total) by mouth every 4 (four) hours as needed for mild pain (pain score 1-3). 03/11/23   Love, Sharlet RAMAN, PA-C  ascorbic acid  (VITAMIN C ) 500 MG tablet Take 1 tablet (500 mg total) by mouth daily with supper. 03/11/23   Love, Sharlet RAMAN, PA-C  aspirin  EC 81 MG tablet Take 1 tablet (81 mg total) by mouth daily. Swallow whole. 06/03/23   Rosemarie Eather RAMAN, MD  busPIRone  (BUSPAR ) 10 MG tablet Take 10 mg by mouth 3 (three) times daily. 02/06/23   [provider]  carvedilol  (COREG ) 12.5 MG tablet TAKE 1 TABLET (12.5MG  TOTAL) BY MOUTH TWICE A DAY WITH MEALS 09/09/23   Babs Arthea DASEN, MD  docusate sodium  (COLACE) 100 MG capsule Take 100 mg by mouth daily.    [provider]  Dulaglutide  (TRULICITY ) 3 MG/0.5ML  SOPN Inject 3 mg into the skin once a week. Patient taking differently: Inject 3 mg into the skin once a week. Inject on Sunday 05/23/22   Angiulli, Toribio PARAS, PA-C  DULoxetine  (CYMBALTA ) 30 MG capsule Take 30 mg by mouth 2 (two) times daily.    [provider]  furosemide  (LASIX ) 40 MG tablet Take 1 tablet (40 mg total) by mouth daily. 09/19/23 10/19/23  Alba Sharper, MD  gabapentin  (NEURONTIN ) 300 MG capsule Take 1 capsule (300 mg total) by  mouth 3 (three) times daily. 10/10/22   Babs Arthea DASEN, MD  guanFACINE (INTUNIV) 2 MG TB24 ER tablet Take 2 mg by mouth daily. 06/07/23   [provider]  hydrochlorothiazide  (HYDRODIURIL ) 12.5 MG tablet Take 1 tablet (12.5 mg total) by mouth 2 (two) times daily. Patient taking differently: Take 12.5 mg by mouth 3 (three) times daily. 03/11/23   Love, Sharlet RAMAN, PA-C  insulin  detemir (LEVEMIR ) 100 UNIT/ML FlexPen Inject 40 Units into the skin at bedtime. 09/19/23   Alba Sharper, MD  levothyroxine  (SYNTHROID ) 100 MCG tablet Take 1 tablet (100 mcg total) by mouth daily at 6 (six) AM. 09/20/23 10/20/23  Alba Sharper, MD  losartan  (COZAAR ) 25 MG tablet Take 1 tablet (25 mg total) by mouth at bedtime. 04/26/23   Howell Lunger, DO  Multiple Vitamins-Minerals (ALIVE MENS GUMMY MULTIVITAMINS) CHEW Chew 3 tablets by mouth daily.    [provider]  NOVOLOG  FLEXPEN 100 UNIT/ML FlexPen Inject 15-20 Units into the skin 3 (three) times daily with meals. Inject 15-20 units into the skin 3 times daily with meals 09/30/23   Howell Lunger, DO  potassium chloride  SA (KLOR-CON  M20) 20 MEQ tablet Take 1 tablet (20 mEq total) by mouth daily. 03/11/23   Love, Sharlet RAMAN, PA-C  rosuvastatin  (CRESTOR ) 40 MG tablet Take 1 tablet (40 mg total) by mouth daily. 03/11/23   Love, Sharlet RAMAN, PA-C  traZODone  (DESYREL ) 100 MG tablet Take 100 mg by mouth at bedtime as needed for sleep. 09/02/23   [provider]                                                                                                                                    Allergies Patient has no known allergies.  Review of Systems Review of Systems As noted in HPI  Physical Exam Vital Signs  I have reviewed the triage vital signs BP (!) 168/96   Pulse 81   Temp 98 F (36.7 C)   Resp 12   Wt 106.8 kg   SpO2 98%   BMI 31.93 kg/m   Physical Exam Vitals reviewed.  Constitutional:      General: He is not in acute  distress.    Appearance: He is well-developed. He is not diaphoretic.  HENT:     Head: Normocephalic and atraumatic.     Nose: Nose normal.  Eyes:     General: No  scleral icterus.       Right eye: No discharge.        Left eye: No discharge.     Conjunctiva/sclera: Conjunctivae normal.     Pupils: Pupils are equal, round, and reactive to light.  Cardiovascular:     Rate and Rhythm: Normal rate and regular rhythm.     Heart sounds: No murmur heard.    No friction rub. No gallop.  Pulmonary:     Effort: Pulmonary effort is normal. No respiratory distress.     Breath sounds: Normal breath sounds. No stridor. No rales.  Abdominal:     General: There is no distension.     Palpations: Abdomen is soft.     Tenderness: There is no abdominal tenderness.  Musculoskeletal:        General: No tenderness.     Cervical back: Normal range of motion and neck supple.  Skin:    General: Skin is warm and dry.     Findings: No erythema or rash.  Neurological:     Mental Status: He is alert and oriented to person, place, and time.     Comments: Follows commands Able to move all extremities with good strength Left facial twitching noted     ED Results and Treatments Labs (all labs ordered are listed, but only abnormal results are displayed) Labs Reviewed  COMPREHENSIVE METABOLIC PANEL WITH GFR - Abnormal; Notable for the following components:      Result Value   Glucose, Bld 492 (*)    BUN 35 (*)    Creatinine, Ser 1.77 (*)    GFR, Estimated 42 (*)    All other components within normal limits  RAPID URINE DRUG SCREEN, HOSP PERFORMED - Abnormal; Notable for the following components:   Benzodiazepines POSITIVE (*)    All other components within normal limits  URINALYSIS, W/ REFLEX TO CULTURE (INFECTION SUSPECTED) - Abnormal; Notable for the following components:   Color, Urine COLORLESS (*)    Glucose, UA >=500 (*)    Hgb urine dipstick SMALL (*)    Protein, ur 30 (*)    All other  components within normal limits  I-STAT CHEM 8, ED - Abnormal; Notable for the following components:   BUN 36 (*)    Creatinine, Ser 1.70 (*)    Glucose, Bld 495 (*)    Calcium , Ion 1.14 (*)    All other components within normal limits  CBG MONITORING, ED - Abnormal; Notable for the following components:   Glucose-Capillary 459 (*)    All other components within normal limits  I-STAT VENOUS BLOOD GAS, ED - Abnormal; Notable for the following components:   pO2, Ven 82 (*)    All other components within normal limits  TROPONIN I (HIGH SENSITIVITY) - Abnormal; Notable for the following components:   Troponin I (High Sensitivity) 20 (*)    All other components within normal limits  ETHANOL  PROTIME-INR  APTT  CBC  DIFFERENTIAL  BETA-HYDROXYBUTYRIC ACID  TROPONIN I (HIGH SENSITIVITY)  EKG  EKG Interpretation Date/Time:  Thursday October 31 2023 23:31:43 EDT Ventricular Rate:  85 PR Interval:  259 QRS Duration:  162 QT Interval:  427 QTC Calculation: 508 R Axis:   171  Text Interpretation: Sinus rhythm Prolonged PR interval RBBB and LPFB Inferior infarct, age indeterminate Anterior infarct, age indeterminate Rate faster Otherwise no significant change Confirmed by Trine Likes [phone]) on 10/31/2023 11:56:31 PM       Radiology CT HEAD CODE STROKE WO CONTRAST Result Date: 10/31/2023 CLINICAL DATA:  Code stroke. Initial evaluation for acute neuro deficit, stroke suspected. EXAM: CT HEAD WITHOUT CONTRAST TECHNIQUE: Contiguous axial images were obtained from the base of the skull through the vertex without intravenous contrast. RADIATION DOSE REDUCTION: This exam was performed according to the departmental dose-optimization program which includes automated exposure control, adjustment of the mA and/or kV according to patient size and/or use of iterative reconstruction  technique. COMPARISON:  CT FROM 04/09/2023 FINDINGS: Brain: Cerebral volume within normal limits. Chronic encephalomalacia involving the posterior right cerebral hemisphere, consistent with a chronic ischemic infarct. No acute intracranial hemorrhage. No acute large vessel territory infarct. No mass lesion, midline shift or mass effect no hydrocephalus or extra-axial fluid collection. Vascular: No abnormal hyperdense vessel. Skull: Scalp soft tissues demonstrate no acute finding. 1.8 cm cystic lesion overlying the right zygomatic arch, consistent with a sebaceous cyst. Sinuses/Orbits: Globes and orbital soft tissues within normal limits. Paranasal sinuses and mastoid air cells are largely clear. Other: None. ASPECTS [facility] Stroke Program Early CT Score) - Ganglionic level infarction (caudate, lentiform nuclei, internal capsule, insula, M1-M3 cortex): 7 - Supraganglionic infarction (M4-M6 cortex): 3 Total score (0-10 with 10 being normal): 10 IMPRESSION: 1. No acute intracranial abnormality. 2. Aspects is 10. 3. Chronic posterior right cerebral infarct. These results were communicated to Dr. Vanessa at 11:25 pm on 10/31/2023 by text page via the [facility] messaging system. Electronically Signed   By: Morene Hoard M.D.   On: 10/31/2023 23:25    Medications Ordered in ED Medications  levETIRAcetam  (KEPPRA ) tablet 500 mg (has no administration in time range)    Or  levETIRAcetam  (KEPPRA ) undiluted injection 500 mg (has no administration in time range)  midazolam  (VERSED ) injection 4 mg (4 mg Intravenous Given 10/31/23 2308)  levETIRAcetam  (KEPPRA ) undiluted injection 4,500 mg (4,500 mg Intravenous Given 10/31/23 2322)  sodium chloride  0.9 % bolus 500 mL (500 mLs Intravenous New Bag/Given 11/01/23 0001)   Procedures Procedures  (including critical care time) Medical Decision Making / ED Course   Medical Decision Making Amount and/or Complexity of Data Reviewed Labs: ordered. Decision-making  details documented in ED Course. Radiology: ordered and independent interpretation performed. Decision-making details documented in ED Course. ECG/medicine tests: ordered and independent interpretation performed. Decision-making details documented in ED Course.  Risk Prescription drug management. Parenteral controlled substances. Decision regarding hospitalization.     Clinical Course as of 11/01/23 0058  Thu Oct 31, 2023  2320 Code stroke. H/o prior stroke w/ left sided weakness. Had sudden L sided weakness this evening. LKN 10p  Focal seizure on arrival. Resolved with versed . CT head w/o acute bleed. Remote right cerebral infarct noted CBC without leukocytosis or anemia. Metabolic panel without significant electrolyte derangement.  Hyperglycemia without DKA. AKI noted. EtOH normal. Troponin slightly elevated.  EKG without acute ischemic changes, or dysrhythmias. [PC]  2351 Neurology recommended admission to the hospital for MRI and placing patient on Keppra  500 milligrams bid  [PC]  Fri Nov 01, 2023  0044 Paged FM for  admission [PC]    Clinical Course User Index [PC] Ivey Cina, Raynell Moder, MD    Final Clinical Impression(s) / ED Diagnoses Final diagnoses:  Seizure (HCC)  Left-sided weakness  Hyperglycemia    This chart was dictated using voice recognition software.  Despite best efforts to proofread,  errors can occur which can change the documentation meaning.    Trine Raynell Moder, MD 11/01/23 [phone]

## 2023-11-01 ENCOUNTER — Other Ambulatory Visit: Payer: Self-pay

## 2023-11-01 ENCOUNTER — Inpatient Hospital Stay (HOSPITAL_COMMUNITY)

## 2023-11-01 ENCOUNTER — Encounter (HOSPITAL_COMMUNITY): Payer: Self-pay | Admitting: Family Medicine

## 2023-11-01 ENCOUNTER — Telehealth (HOSPITAL_COMMUNITY): Payer: Self-pay | Admitting: Pharmacy Technician

## 2023-11-01 ENCOUNTER — Other Ambulatory Visit (HOSPITAL_COMMUNITY): Payer: Self-pay

## 2023-11-01 DIAGNOSIS — R569 Unspecified convulsions: Secondary | ICD-10-CM | POA: Diagnosis not present

## 2023-11-01 DIAGNOSIS — G40909 Epilepsy, unspecified, not intractable, without status epilepticus: Secondary | ICD-10-CM

## 2023-11-01 LAB — TROPONIN I (HIGH SENSITIVITY)
Troponin I (High Sensitivity): 19 ng/L — ABNORMAL HIGH (ref <18)
Troponin I (High Sensitivity): 20 ng/L — ABNORMAL HIGH (ref <18)

## 2023-11-01 LAB — URINALYSIS, W/ REFLEX TO CULTURE (INFECTION SUSPECTED)
Bacteria, UA: NONE SEEN
Bilirubin Urine: NEGATIVE
Glucose, UA: >=500 mg/dL — AB
Ketones, ur: NEGATIVE mg/dL
Leukocytes,Ua: NEGATIVE
Nitrite: NEGATIVE
Protein, ur: 30 mg/dL — AB
Specific Gravity, Urine: 1.014 (ref 1.005–1.030)
pH: 7 (ref 5.0–8.0)

## 2023-11-01 LAB — BASIC METABOLIC PANEL WITH GFR
Anion gap: 9 (ref 5–15)
BUN: 29 mg/dL — ABNORMAL HIGH (ref 8–23)
CO2: 27 mmol/L (ref 22–32)
Calcium: 9.1 mg/dL (ref 8.9–10.3)
Chloride: 103 mmol/L (ref 98–111)
Creatinine, Ser: 1.33 mg/dL — ABNORMAL HIGH (ref 0.61–1.24)
GFR, Estimated: 59 mL/min — ABNORMAL LOW (ref >60)
Glucose, Bld: 448 mg/dL — ABNORMAL HIGH (ref 70–99)
Potassium: 3.8 mmol/L (ref 3.5–5.1)
Sodium: 139 mmol/L (ref 135–145)

## 2023-11-01 LAB — CBG MONITORING, ED
Glucose-Capillary: 431 mg/dL — ABNORMAL HIGH (ref 70–99)
Glucose-Capillary: 458 mg/dL — ABNORMAL HIGH (ref 70–99)
Glucose-Capillary: 277 mg/dL — ABNORMAL HIGH (ref 70–99)

## 2023-11-01 LAB — LIPID PANEL
Cholesterol: 152 mg/dL (ref 0–200)
HDL: 52 mg/dL (ref >40)
LDL Cholesterol: 88 mg/dL (ref 0–99)
Total CHOL/HDL Ratio: 2.9 ratio
Triglycerides: 59 mg/dL (ref <150)
VLDL: 12 mg/dL (ref 0–40)

## 2023-11-01 LAB — MAGNESIUM: Magnesium: 1.9 mg/dL (ref 1.7–2.4)

## 2023-11-01 LAB — GLUCOSE, CAPILLARY
Glucose-Capillary: 234 mg/dL — ABNORMAL HIGH (ref 70–99)
Glucose-Capillary: 151 mg/dL — ABNORMAL HIGH (ref 70–99)

## 2023-11-01 LAB — RAPID URINE DRUG SCREEN, HOSP PERFORMED
Amphetamines: NOT DETECTED
Barbiturates: NOT DETECTED
Benzodiazepines: POSITIVE — AB
Cocaine: NOT DETECTED
Opiates: NOT DETECTED
Tetrahydrocannabinol: NOT DETECTED

## 2023-11-01 LAB — HEMOGLOBIN A1C
Hgb A1c MFr Bld: 10.9 % — ABNORMAL HIGH (ref 4.8–5.6)
Mean Plasma Glucose: 266.13 mg/dL

## 2023-11-01 LAB — BRAIN NATRIURETIC PEPTIDE: B Natriuretic Peptide: 510.2 pg/mL — ABNORMAL HIGH (ref 0.0–100.0)

## 2023-11-01 LAB — BETA-HYDROXYBUTYRIC ACID: Beta-Hydroxybutyric Acid: 0.16 mmol/L (ref 0.05–0.27)

## 2023-11-01 MED ORDER — INSULIN GLARGINE 100 UNIT/ML ~~LOC~~ SOLN
40.0000 [IU] | SUBCUTANEOUS | Status: DC
  Administered 2023-11-01: 40 [IU] via SUBCUTANEOUS
  Filled 2023-11-01 (×2): qty 0.4

## 2023-11-01 MED ORDER — ASPIRIN 81 MG PO TBEC
81.0000 mg | DELAYED_RELEASE_TABLET | Freq: Every day | ORAL | Status: DC
  Administered 2023-11-01 – 2023-11-02 (×2): 81 mg via ORAL
  Filled 2023-11-01 (×2): qty 1

## 2023-11-01 MED ORDER — INSULIN ASPART 100 UNIT/ML IJ SOLN
10.0000 [IU] | Freq: Once | INTRAMUSCULAR | Status: AC
  Administered 2023-11-01: 10 [IU] via SUBCUTANEOUS

## 2023-11-01 MED ORDER — ROSUVASTATIN CALCIUM 20 MG PO TABS
40.0000 mg | ORAL_TABLET | Freq: Every day | ORAL | Status: DC
  Administered 2023-11-01 – 2023-11-02 (×2): 40 mg via ORAL
  Filled 2023-11-01 (×2): qty 2

## 2023-11-01 MED ORDER — FUROSEMIDE 40 MG PO TABS
40.0000 mg | ORAL_TABLET | Freq: Every day | ORAL | Status: DC
  Administered 2023-11-02: 40 mg via ORAL
  Filled 2023-11-01: qty 1

## 2023-11-01 MED ORDER — SENNOSIDES-DOCUSATE SODIUM 8.6-50 MG PO TABS: 1.0000 | ORAL_TABLET | Freq: Every evening | ORAL | Status: DC | PRN

## 2023-11-01 MED ORDER — MAGNESIUM SULFATE 2 GM/50ML IV SOLN
2.0000 g | Freq: Once | INTRAVENOUS | Status: AC
  Administered 2023-11-01: 2 g via INTRAVENOUS
  Filled 2023-11-01: qty 50

## 2023-11-01 MED ORDER — INSULIN GLARGINE-YFGN 100 UNIT/ML ~~LOC~~ SOLN
40.0000 [IU] | SUBCUTANEOUS | Status: DC
  Administered 2023-11-02: 40 [IU] via SUBCUTANEOUS
  Filled 2023-11-01 (×2): qty 0.4

## 2023-11-01 MED ORDER — BUSPIRONE HCL 10 MG PO TABS
10.0000 mg | ORAL_TABLET | Freq: Three times a day (TID) | ORAL | Status: DC
  Administered 2023-11-01 – 2023-11-02 (×6): 10 mg via ORAL
  Filled 2023-11-01 (×6): qty 1

## 2023-11-01 MED ORDER — ACETAMINOPHEN 160 MG/5ML PO SOLN: 650.0000 mg | ORAL | Status: DC | PRN

## 2023-11-01 MED ORDER — ACETAMINOPHEN 325 MG PO TABS: 650.0000 mg | ORAL_TABLET | ORAL | Status: DC | PRN

## 2023-11-01 MED ORDER — LOSARTAN POTASSIUM 50 MG PO TABS: 25.0000 mg | ORAL_TABLET | Freq: Every day | ORAL | Status: DC

## 2023-11-01 MED ORDER — GABAPENTIN 300 MG PO CAPS
300.0000 mg | ORAL_CAPSULE | Freq: Three times a day (TID) | ORAL | Status: DC
  Administered 2023-11-01 – 2023-11-02 (×6): 300 mg via ORAL
  Filled 2023-11-01 (×6): qty 1

## 2023-11-01 MED ORDER — CARVEDILOL 12.5 MG PO TABS
12.5000 mg | ORAL_TABLET | Freq: Two times a day (BID) | ORAL | Status: DC
  Administered 2023-11-01 – 2023-11-02 (×4): 12.5 mg via ORAL
  Filled 2023-11-01 (×4): qty 1

## 2023-11-01 MED ORDER — ACETAMINOPHEN 650 MG RE SUPP: 650.0000 mg | RECTAL | Status: DC | PRN

## 2023-11-01 MED ORDER — ENOXAPARIN SODIUM 60 MG/0.6ML IJ SOSY
50.0000 mg | PREFILLED_SYRINGE | INTRAMUSCULAR | Status: DC
  Administered 2023-11-01 – 2023-11-02 (×2): 50 mg via SUBCUTANEOUS
  Filled 2023-11-01 (×2): qty 0.6

## 2023-11-01 MED ORDER — INSULIN ASPART 100 UNIT/ML IJ SOLN: 0.0000 [IU] | Freq: Every day | INTRAMUSCULAR | Status: DC

## 2023-11-01 MED ORDER — FUROSEMIDE 20 MG PO TABS: 40.0000 mg | ORAL_TABLET | Freq: Every day | ORAL | Status: DC

## 2023-11-01 MED ORDER — POTASSIUM CHLORIDE CRYS ER 20 MEQ PO TBCR
40.0000 meq | EXTENDED_RELEASE_TABLET | Freq: Once | ORAL | Status: AC
  Administered 2023-11-01: 40 meq via ORAL
  Filled 2023-11-01: qty 2

## 2023-11-01 MED ORDER — LEVOTHYROXINE SODIUM 100 MCG PO TABS
100.0000 ug | ORAL_TABLET | Freq: Every day | ORAL | Status: DC
  Administered 2023-11-01 – 2023-11-02 (×2): 100 ug via ORAL
  Filled 2023-11-01 (×2): qty 1

## 2023-11-01 MED ORDER — FUROSEMIDE 10 MG/ML IJ SOLN
40.0000 mg | Freq: Once | INTRAMUSCULAR | Status: AC
  Administered 2023-11-01: 40 mg via INTRAVENOUS
  Filled 2023-11-01: qty 4

## 2023-11-01 MED ORDER — INSULIN ASPART 100 UNIT/ML IJ SOLN
0.0000 [IU] | Freq: Three times a day (TID) | INTRAMUSCULAR | Status: DC
  Administered 2023-11-01: 7 [IU] via SUBCUTANEOUS
  Administered 2023-11-01: 11 [IU] via SUBCUTANEOUS
  Administered 2023-11-02 (×2): 3 [IU] via SUBCUTANEOUS
  Administered 2023-11-02: 7 [IU] via SUBCUTANEOUS

## 2023-11-01 MED ORDER — INSULIN GLARGINE 100 UNIT/ML ~~LOC~~ SOLN
40.0000 [IU] | Freq: Every day | SUBCUTANEOUS | Status: DC
  Filled 2023-11-01: qty 0.4

## 2023-11-01 MED ORDER — STROKE: EARLY STAGES OF RECOVERY BOOK
Freq: Once | Status: AC
  Filled 2023-11-01: qty 1

## 2023-11-01 MED ORDER — INSULIN ASPART 100 UNIT/ML IJ SOLN: 0.0000 [IU] | Freq: Three times a day (TID) | INTRAMUSCULAR | Status: DC

## 2023-11-01 MED ORDER — DULOXETINE HCL 20 MG PO CPEP
40.0000 mg | ORAL_CAPSULE | Freq: Every day | ORAL | Status: DC
  Administered 2023-11-01 – 2023-11-02 (×2): 40 mg via ORAL
  Filled 2023-11-01 (×2): qty 2

## 2023-11-01 NOTE — Procedures (Signed)
 Patient Name: Joseph Ortega  MRN: [id number]  Epilepsy Attending: Arlin MALVA Krebs  Referring Physician/Provider: Khaliqdina, Salman, MD  Date: 11/01/2023 Duration: 24.58 mins  Patient history:  68 y.o. male with hx of prior right MCA infarct with hemorrhagic transformation, diabetes type 2, hypertension, hypothyroidism, sleep apnea who is brought in by EMS as a code stroke for worsening of his baseline left-sided weakness.  He had focal seizure with left facial twitching, left arm jerking and left gaze deviation.  The seizure resolved with Versed .  EEG to evaluate for seizure  Level of alertness: Awake, asleep  AEDs during EEG study: LEV  Technical aspects: This EEG study was done with scalp electrodes positioned according to the 10-20 International system of electrode placement. Electrical activity was reviewed with band pass filter of 1-70Hz , sensitivity of 7 uV/mm, display speed of 73mm/sec with a 60Hz  notched filter applied as appropriate. EEG data were recorded continuously and digitally stored.  Video monitoring was available and reviewed as appropriate.  Description: The posterior dominant rhythm consists of 8 Hz activity of moderate voltage (25-35 uV) seen predominantly in posterior head regions, asymmetric ( right<left) and reactive to eye opening and eye closing. Sleep was characterized by vertex waves, sleep spindles (12 to 14 Hz), maximal frontocentral region. EEG showed continuous 3 to 6 Hz theta-delta slowing in right hemisphere. Hyperventilation and photic stimulation were not performed.     ABNORMALITY - Continuous slow, right hemisphere  IMPRESSION: This study is suggestive of cortical dysfunction arising from right hemisphere likely secondary to underlying structural abnormality/encephalomalacia. No seizures or epileptiform discharges were seen throughout the recording.  Jayra Choyce O Olsen Mccutchan

## 2023-11-01 NOTE — Assessment & Plan Note (Addendum)
 Blood glucose on admission was 459. Most recent A1c of 11.8. No anion gap or concern for HHS/DKA. Home regimen include NovoLog  15-20 units 3 times daily with meals, Insulin  detemir 40 units at bedtime, Trulicity  3 mg / 0.5 mL weekly -Monitor daily CBG -F/u Hgb A1c -Heart healthy/ Carb modified diet  -Sensitive sliding scale insulin  - Lantus  40u at bedtime - continue home cymbalta  40mg  daily, gabapentin  300mg  TID

## 2023-11-01 NOTE — Assessment & Plan Note (Addendum)
 Blood glucose on admission was 459. Now 448. Most recent A1C in June is 11.8. No concern for DKA/HHS. Home regimen include NovoLog  15-20 units 3 times daily with meals, Insulin  detemir 40 units at bedtime, Trulicity  3 mg / 0.5 mL weekly - Switch to resistant sliding scale insulin  - Lantus  40u at bedtime - Monitor daily CBG - Hgb A1c pending - Continue home cymbalta  40mg  daily, gabapentin  300mg  TID

## 2023-11-01 NOTE — Care Management Obs Status (Signed)
 MEDICARE OBSERVATION STATUS NOTIFICATION   Patient Details  Name: Joseph Ortega MRN: [id number] Date of Birth: [birth year]   Medicare Observation Status Notification Given:  Yes    Andrez JULIANNA George, RN 11/01/2023, 3:53 PM

## 2023-11-01 NOTE — Hospital Course (Addendum)
 Joseph Ortega is a 68 y.o.male with a history of CHF EF 35%, prior hemorrhagic stroke, HTN, HLD, and DM who was admitted to the [facility] at [facility] for seizure. His hospital course is detailed below:  Seizure Patient presented with worsening of baseline left-sided weakness along with forehead twitching and left eye deviation suspected to be seizure activity. Workup is negative for stroke or ACS. Neurology was consulted and is following patient and recommended Keppra  500mg  BID and EEG consistent with post-stroke epilepsy . PT/OT/SLP consulted, initially attempted SNF however patient did not agree to SNF placement-instead opted for home health services.  Discharged on Keppra  500 mg twice daily, with recommendation for neurology follow-up outpatient.  AKI Baseline creatinine is 1, on admission was 1.77>1.33. He received a bolus of NS in ED and after appeared fluid overloaded. Given lasix  daily and held losartan . Strict I&O, held IVF, and avoided nephrotoxic agents.  AKI resolved, losartan  restarted.  Hydrochlorothiazide  and potassium remained held until PCP follow-up.  DM II Blood glucose on admission was 459>448. Most recent A1C in June 2025 was 11.8. Current A1C 10.9.  Not concerned for DKA/HHS. Switched to resistant sliding scale insulin  with lantus  40u at bedtime. Monitored daily CBGs.  Patient was sent home with this home regimen.  Other chronic conditions were medically managed with home medications and formulary alternatives as necessary (HTN, hypothyroidism, HLD, CAD,CHF)  PCP Follow-up Recommendations:  DM regimen, would likely benefit from our pharmacy clinic [facility] Ensure follow-up with neurology Consider repeat BMP, consider restarting hydrochlorothiazide  if more blood pressure control is needed Reevaluate necessity of trazodone 

## 2023-11-01 NOTE — ED Notes (Signed)
 EEG en route

## 2023-11-01 NOTE — ED Notes (Signed)
 Pt to MRI

## 2023-11-01 NOTE — Assessment & Plan Note (Addendum)
 Creatinine on admission is 1.77 and baseline around 1.  Received bolus NS in ED. Patient appears to be fluid overloaded and says he feels a little puffy - Lasix  40mg  IV followed by Lasix  40mg  PO daily - Avoid nephrotoxic agent - hold IVF - Hold home Losartan  25 mg at bedtime - AM BMP - strict I&O

## 2023-11-01 NOTE — Plan of Care (Signed)

## 2023-11-01 NOTE — Progress Notes (Signed)
 Inpatient Rehab Admissions Coordinator:   Per therapy recommendations, patient was screened for CIR candidacy by Leita Kleine, MS, CCC-SLP. At this time, Pt. does not appear to demonstrate medical necessity to justify in hospital rehabilitation/CIR and I do not think payor will approve. I will not pursue a rehab consult for this Pt.   Recommend other rehab venues to be pursued.  Please contact me with any questions.  Leita Kleine, MS, CCC-SLP Rehab Admissions Coordinator  [phone] (celll) [phone] (office)

## 2023-11-01 NOTE — Assessment & Plan Note (Addendum)
 Resolved. Back to baseline.  - Continue home Lasix  40mg  PO daily - Restart Losartan  25 mg  - AM BMP

## 2023-11-01 NOTE — Assessment & Plan Note (Addendum)
 Unclear etiology, likely from previous history of strokes, hypoglycemia and hypertensive emergency which was resolved on admission. MRI brain shows no acute intracranial abnormality. - Neurology following, appreciate recommendations - Continue Keppra  500mg  BID. - EEG pending - Observe for seizures - Neuro checks q4hrs - PT/OT/SLP pending

## 2023-11-01 NOTE — Assessment & Plan Note (Addendum)
 HTN - continue home carvedilol  12.5 mg BID Hypothyroidism - continue home Levothyroxine  100 mcg daily HLD- continue home Crestor  40 mg daily CAD - continue home aspirin  81mg  daily,

## 2023-11-01 NOTE — Assessment & Plan Note (Addendum)
 Creatinine on admission is 1.77 and baseline around 1. Currently 1.33. Received bolus NS in ED. Patient appears to be fluid overloaded and says he feels a little puffy. BLE edema. - Lasix  40mg  PO daily - Hold home Losartan  25 mg at bedtime - Strict I&O - Hold IVF - AM BMP - Avoid nephrotoxic agents

## 2023-11-01 NOTE — Progress Notes (Signed)
 EEG complete, results are pending.

## 2023-11-01 NOTE — Assessment & Plan Note (Addendum)
 HTN - continue home carvedilol  12.5 mg BID Hypothyroidism - continue home Levothyroxine  100 mcg daily HLD- continue home Crestor  40 mg daily CAD - continue home aspirin  81mg  daily CHF- continue carvedilol , holding the losartan  for AKI, lasix  40 PO daily

## 2023-11-01 NOTE — Care Management CC44 (Signed)
 Condition Code 44 Documentation Completed  Patient Details  Name: Zavien Clubb MRN: [id number] Date of Birth: 08-May-1955   Condition Code 44 given:  Yes Patient signature on Condition Code 44 notice:  Yes Documentation of 2 MD's agreement:  Yes Code 44 added to claim:  Yes    Andrez JULIANNA George, RN 11/01/2023, 3:53 PM

## 2023-11-01 NOTE — Progress Notes (Signed)
     Daily Progress Note Intern Pager: [phone]  Patient name: Joseph Ortega Medical record number: [id number] Date of birth: 12-05-1955 Age: 68 y.o. Gender: male  Primary Care Provider: Howell Lunger, DO Consultants: neurology  Code Status: Full code  Pt Overview and Major Events to Date:  8/15: Admitted for seizure  8/16: medically stable for discharge   Assessment and Plan:  Joseph Ortega is a 68 y.o. male admitted for new onset seizure activity. Now awaiting SNF placement. Medically stable for discharge with safe disposition.  Assessment & Plan Seizure [facility]) Unclear etiology, likely from previous history of strokes, hypoglycemia and hypertensive emergency which was resolved on admission. MRI brain shows no acute intracranial abnormality. - Neurology following, appreciate recommendations - Continue Keppra  500mg  BID. - EEG pending - Observe for seizures - Neuro checks q4hrs - PT/OT/SLP pending AKI (acute kidney injury) (HCC) Resolved. Back to baseline.  - Continue home Lasix  40mg  PO daily - Restart Losartan  25 mg  - AM BMP Chronic health problem T2DM: continue lantus  40 units daily with rSSI. Home regimen include NovoLog  15-20 units 3 times daily with meals, Insulin  detemir 40 units at bedtime, Trulicity  3 mg / 0.5 mL weekly; continue home cymbalta  40 mg daily and gabapentin  300 mg TID HTN - continue home carvedilol  12.5 mg BID Hypothyroidism - continue home Levothyroxine  100 mcg daily HLD- continue home Crestor  40 mg daily CAD - continue home aspirin  81mg  daily CHF- continue carvedilol , holding the losartan  for AKI, lasix  40 PO daily  FEN/GI: Heart healthy carb modified PPx: Lovenox  Dispo:SNF pending insurance authorization.   Subjective:  Resting comfortably. No acute events overnight   Objective: Temp:  [97.7 F (36.5 C)-98.4 F (36.9 C)] 98.3 F (36.8 C) (08/15 1946) Pulse Rate:  [62-83] 64 (08/15 1946) Resp:  [12-22] 20 (08/15 1946) BP:  (132-171)/(69-96) 140/75 (08/15 1946) SpO2:  [91 %-100 %] 95 % (08/15 1946) Weight:  [100.6 kg-106.8 kg] 100.6 kg (08/15 1538) Physical Exam: General: Chronically ill-appearing, no acute distress Cardio: RRR, no murmur on exam Pulm: clear, No increased work of breathing Abdomen: Soft, bowel sounds present, nontender Extremity: trace peripheral edema   Laboratory: Most recent CBC Lab Results  Component Value Date   WBC 6.2 10/31/2023   HGB 13.3 10/31/2023   HCT 39.0 10/31/2023   MCV 95.3 10/31/2023   PLT 159 10/31/2023   Most recent BMP    Latest Ref Rng & Units 11/01/2023    5:08 AM  BMP  Glucose 70 - 99 mg/dL 551   BUN 8 - 23 mg/dL 29   Creatinine 9.38 - 1.24 mg/dL 8.66   Sodium 864 - 854 mmol/L 139   Potassium 3.5 - 5.1 mmol/L 3.8   Chloride 98 - 111 mmol/L 103   CO2 22 - 32 mmol/L 27   Calcium  8.9 - 10.3 mg/dL 9.1    Imaging/Diagnostic Tests: No new imaging  Cleotilde Perkins, DO 11/01/2023, 9:32 PM  PGY-3, [facility] FPTS Intern pager: [phone], text pages welcome Secure chat group [facility] Teaching Service

## 2023-11-01 NOTE — H&P (Addendum)
 Hospital Admission History and Physical Service Pager: [phone]  Patient name: Joseph Ortega Medical record number: [id number] Date of Birth: 1955/07/04 Age: 68 y.o. Gender: male  Primary Care Provider: Dr. Gladis Church, DO Consultants: neurology Code Status: FULL  Preferred Emergency Contact:  Contact Information     Name Relation Home Work Mobile   Yamaguchi,[name] Spouse   [phone]      Other Contacts   None on File      Chief Complaint: left-sided weakness  Differential and Medical Decision Making:  Joseph Ortega is a 68 y.o. male presenting with worsening of baseline left sided weakness.  Differential for this patient's presentation of this includes stroke, seizure, and ACS.  Low suspicion for stroke given CT head was negative for any acute intracranial abnormalities. Low suspicion of ACS given EKG without ischemic changes, troponins negative and patient denying CP. Metabolic panel did not show any sitgnificant electrolyte derrangement, CBC without anemia.  Patient presentation with EMS- left eye deviation, forehead twitching and left arm stiffness that improved with Versed  and Keppra  make seizure a higher likelihood for his presentation. Assessment & Plan Seizure [facility]) Current etiology unclear, likely from prior stroke, hypoglycemia and hypertensive emergency which has resolved on admission - start keppra  500mg  BID - order EEG - observe overnight for seizure clustering - PT/OT/SLP evaluation - neuro checks q 4h - aspiration precaution - fall precaution - seizure precaution - MRI brain wo contrast - AM BMP, lipid panel, Mg2+ AKI (acute kidney injury) (HCC) Creatinine on admission is 1.77 and baseline around 1.  Received bolus NS in ED. Patient appears to be fluid overloaded and says he feels a little puffy - Lasix  40mg  IV followed by Lasix  40mg  PO daily - Avoid nephrotoxic agent - hold IVF - Hold home Losartan  25 mg at bedtime - AM BMP - strict  I&O Type 2 diabetes mellitus (HCC) Blood glucose on admission was 459. Most recent A1c of 11.8. No anion gap or concern for HHS/DKA. Home regimen include NovoLog  15-20 units 3 times daily with meals, Insulin  detemir 40 units at bedtime, Trulicity  3 mg / 0.5 mL weekly -Monitor daily CBG -F/u Hgb A1c -Heart healthy/ Carb modified diet  -Sensitive sliding scale insulin  - Lantus  40u at bedtime - continue home cymbalta  40mg  daily, gabapentin  300mg  TID Chronic health problem HTN - continue home carvedilol  12.5 mg BID Hypothyroidism - continue home Levothyroxine  100 mcg daily HLD- continue home Crestor  40 mg daily CAD - continue home aspirin  81mg  daily,   FEN/GI: heart health, carb-modified VTE Prophylaxis: lovenox   Disposition: awaiting PT/OT evaluation  History of Present Illness:  Joseph Ortega is a 68 y.o. male presenting with worsening of baseline left sided weakness.   Patient says his wife recently had surgery she has been trying to take care of the dogs and do things around the home for her. He reported abrupt onset left-sided weakness when he was taking the dogs outside, and his wife was able to call 911. EMS noted patient developing left gaze deviation, forehead twitching and left arm stiffening.   Patient is pleasant on examination with FMTS and denying concern and says he is feeling okay.  In the ED, patient received IV versed  4mg  and Keppra  60mg /kg. ED performed head CT which was negative for any acute intracranial abnormalities.  Review Of Systems: Per HPI with the following additions: endorse polyuria, denies CP, SOB, abd pain  Pertinent Past Medical History: Right MCA infarct w hemorrhagic transformation T2DM HTN  Hypothyroidism CAD Remainder reviewed in history tab.   Pertinent Past Surgical History: Anterior cervical decompression/discectomy fusion Remainder reviewed in history tab.  Pertinent Social History: Tobacco use: No Alcohol use: socially Other  Substance use: denies Lives with his wife and dogs  Pertinent Family History: Denies FH of seizure disorder   Important Outpatient Medications: Acetaminophen  325mg  every 4h as needed Vitamin C  500 mg tablet daily Aspirin  81 mg daily BuSpar  10mg  3 times daily Carvedilol  12.5 mg twice a day  Colace 100 mg daily  Trulicity  3 mg / 0.5 mL weekly Duloxetine  40 mg daily  Lasix  40 mg daily  gabapentin  300 mg 3 times a day Insulin  detemir 40 units subcutaneously at bedtime Levothyroxine  100 mcg daily Losartan  25 mg at bedtime NovoLog  15-20 units into the skin 3 times daily with meals Crestor  40 mg daily Trazodone  100 mg at bedtime    Objective: BP (!) 161/77   Pulse 81   Temp 98 F (36.7 C)   Resp (!) 21   Wt 235 lb 7.2 oz (106.8 kg)   SpO2 94%   BMI 31.93 kg/m  Exam: General: laying in bed, NAD ENTM: mucous membranes moist Neck: supple, no pain or tenderness Cardiovascular: RRR, no r/m/g Respiratory: normal work of breathing on RA Gastrointestinal: non-tender,  Extremities: non-pitting LE edema, more edematous on L side Neuro: flaccid tone in LUE 4/5 strength RUE, sensation equal and intact    Labs:  CBC BMET  Recent Labs  Lab 10/31/23 2307 10/31/23 2311 10/31/23 2357  WBC 6.2  --   --   HGB 14.2   < > 13.3  HCT 42.4   < > 39.0  PLT 159  --   --    < > = values in this interval not displayed.   Recent Labs  Lab 10/31/23 2307 10/31/23 2311 10/31/23 2357  NA 135 138 138  K 4.2 4.2 4.1  CL 100 101  --   CO2 24  --   --   BUN 35* 36*  --   CREATININE 1.77* 1.70*  --   GLUCOSE 492* 495*  --   CALCIUM  9.1  --   --     Pertinent additional labs: creatinine 1.77  . EKG: My own interpretation: unchanged from previous EKG   Imaging Studies Performed:  CT Head Code Stroke: Impression from Radiologist:  1. No acute intracranial abnormality. 2. Aspects is 10. 3. Chronic posterior right cerebral infarct   Idelle Nakai, DO 11/01/2023, 2:48 AM PGY-1,  [facility]  FPTS Intern pager: [phone], text pages welcome Secure chat group [facility] Teaching Service    Reviewed and updated with appropriate changes.   Damien Pinal, DO [facility], PGY-3 11/01/23 4:16 AM

## 2023-11-01 NOTE — Assessment & Plan Note (Signed)
 Unclear etiology, likely from previous history of strokes, hypoglycemia and hypertensive emergency which was resolved on admission. MRI brain shows no acute intracranial abnormality. - Neurology following, appreciate recommendations - Continue Keppra  500mg  BID. - EEG pending - Observe for seizures - Neuro checks q4hrs - PT/OT/SLP pending

## 2023-11-01 NOTE — ED Notes (Signed)
 Pt to CT with RN and Neurologist.

## 2023-11-01 NOTE — Inpatient Diabetes Management (Signed)
 Inpatient Diabetes Program Recommendations  AACE/ADA: New Consensus Statement on Inpatient Glycemic Control (2015)  Target Ranges:  Prepandial:   less than 140 mg/dL      Peak postprandial:   less than 180 mg/dL (1-2 hours)      Critically ill patients:  140 - 180 mg/dL   Lab Results  Component Value Date   GLUCAP 277 (H) 11/01/2023   HGBA1C 11.8 (H) 09/13/2023    Review of Glycemic Control  Latest Reference Range & Units 11/01/23 07:24 11/01/23 08:34 11/01/23 12:03  Glucose-Capillary 70 - 99 mg/dL 568 (H) 541 (H) 722 (H)  (H): Data is abnormally high Diabetes history: Type 2 DM Outpatient Diabetes medications: Levemir  40 units at bedtime, Novolog  20 untis TID, Trulicity  3 mg qwk Current orders for Inpatient glycemic control: Lantus  40 units every day, Novolog  0-20 units TID & HS  Inpatient Diabetes Program Recommendations:    Spoke with patient regarding outpatient diabetes management. Patient admits to missing doses, especially of the Levemir  because he reports, I don't feel that Levemir  works as well as the Novolog . Reviewed patient's current A1c of 11.8%. Explained what a A1c is and what it measures. Also reviewed goal A1c with patient, importance of good glucose control @ home, and blood sugar goals. Reviewed patho of DM, need for improved control, signs and symptoms of hypo vs hyper glycemia, vascular changes, differences between short acting insulin  and long acting insulin , and other commorbidities.  Admits to hypoglycemia frequently. Patient educated on importance of taking medication as prescribed. Encouraged to reach out to PCP if exceeding below/above targets. Has a meter and has misplaced a Dexcom G7 at home. Sent message to pharmacy to assist with benefits check. Will provide sample of FSL3 to patient to assist and ensure not related to hypoglycemia. No additional questions at this time.   Thanks, Tinnie Minus, MSN, RNC-OB Diabetes Coordinator [phone]  (8a-5p)

## 2023-11-01 NOTE — Assessment & Plan Note (Signed)
 T2DM: continue lantus  40 units daily with rSSI. Home regimen include NovoLog  15-20 units 3 times daily with meals, Insulin  detemir 40 units at bedtime, Trulicity  3 mg / 0.5 mL weekly; continue home cymbalta  40 mg daily and gabapentin  300 mg TID HTN - continue home carvedilol  12.5 mg BID Hypothyroidism - continue home Levothyroxine  100 mcg daily HLD- continue home Crestor  40 mg daily CAD - continue home aspirin  81mg  daily CHF- continue carvedilol , holding the losartan  for AKI, lasix  40 PO daily

## 2023-11-01 NOTE — Telephone Encounter (Signed)
 Patient Product/process development scientist completed.    The patient is insured through [employer]. Patient has Medicare and is not eligible for a copay card, but may be able to apply for patient assistance or Medicare RX Payment Plan (Patient Must reach out to their plan, if eligible for payment plan), if available.    Ran test claim for Dexcom G7 Sensor and the current 30 day co-pay is $64.60.  Ran test claim for Freestyle Libre 3 Plus Sensor and the current 30 day co-pay is $29.13  This test claim was processed through [employer]- copay amounts may vary at other pharmacies due to [employer], or as the patient moves through the different stages of their insurance plan.     Morgan Arab, CPHT Pharmacy Technician III Certified Patient Advocate [facility] Patient Advocate Team Direct Number: [phone]  Fax: [phone]

## 2023-11-01 NOTE — Evaluation (Signed)
 Occupational Therapy Evaluation Patient Details Name: Joseph Ortega MRN: [id number] DOB: 1955-03-21 Today's Date: 11/01/2023   History of Present Illness   Pt is a 68 y/o M admitted on 10/31/23 after presenting with c/o worsening L sided weakness. Pt is being treated for possible seizure. PMH: HTN, HLD, DM, severe multivessel CAD, hemorrhagic stroke, anxiety, arthritis, depression, sleep apnea, thyroid disease     Clinical Impressions Joseph Ortega was evaluated s/p the above admission list. He is mod I with DME at baseline, he reports intermittent assist from wife for ADLs as needed and recent d/c from HHPT. Upon evaluation the pt was limited by chronic L hemi, unsteady balance, weakness and poor activity tolerance. Overall he needed up to mod A +2 to correct LOBs when ambulating in the hallway with RW. Due to the deficits listed below the pt also needs up to mod A for UB ADLs and max A for LB ADLs. Pt will benefit from continued acute OT services and intensive inpatient follow up therapy, >3 hours/day after discharge.        If plan is discharge home, recommend the following:   A lot of help with walking and/or transfers;A lot of help with bathing/dressing/bathroom;Assistance with cooking/housework;Help with stairs or ramp for entrance;Assist for transportation     Functional Status Assessment   Patient has had a recent decline in their functional status and demonstrates the ability to make significant improvements in function in a reasonable and predictable amount of time.      Recommendations for Other Services   Rehab consult     Precautions/Restrictions   Precautions Precautions: Fall Precaution/Restrictions Comments: has AFO (does not wear it), has L wrist brace (does not wear it) Restrictions Weight Bearing Restrictions Per Provider Order: No     Mobility Bed Mobility Overal bed mobility: Needs Assistance Bed Mobility: Supine to Sit, Sit to Supine     Supine to  sit: Min assist Sit to supine: Contact guard assist        Transfers Overall transfer level: Needs assistance Equipment used: Rolling walker (2 wheels) Transfers: Sit to/from Stand Sit to Stand: Min assist, +2 physical assistance, +2 safety/equipment           General transfer comment: min A +2 for STS but mod A +2 needed for gait due to multiple LOBs      Balance Overall balance assessment: Needs assistance Sitting-balance support: Feet supported Sitting balance-Leahy Scale: Fair     Standing balance support: Bilateral upper extremity supported, During functional activity Standing balance-Leahy Scale: Poor Standing balance comment: mod A +2 to correct LOBs                           ADL either performed or assessed with clinical judgement   ADL Overall ADL's : Needs assistance/impaired Eating/Feeding: Modified independent   Grooming: Supervision/safety;Sitting   Upper Body Bathing: Minimal assistance;Sitting   Lower Body Bathing: Maximal assistance;Sit to/from stand   Upper Body Dressing : Moderate assistance;Sitting   Lower Body Dressing: Maximal assistance;Sit to/from stand   Toilet Transfer: Moderate assistance;+2 for physical assistance;+2 for safety/equipment Toilet Transfer Details (indicate cue type and reason): mod A +2 to LOBs Toileting- Clothing Manipulation and Hygiene: Total assistance;Sit to/from stand Toileting - Clothing Manipulation Details (indicate cue type and reason): hygiene in standing. pt required BUE support on evaluation     Functional mobility during ADLs: Moderate assistance;+2 for safety/equipment;+2 for physical assistance General ADL Comments: limited by chronic  L hemi, unsteady gait with multiple LOBs     Vision Baseline Vision/History: 0 No visual deficits Patient Visual Report: No change from baseline Vision Assessment?: No apparent visual deficits Additional Comments: denies changes, L eye ptosis     Perception  Perception: Not tested       Praxis Praxis: Not tested       Pertinent Vitals/Pain Pain Assessment Pain Assessment: No/denies pain     Extremity/Trunk Assessment Upper Extremity Assessment Upper Extremity Assessment: Defer to OT evaluation LUE Deficits / Details: L hemi, limited AROM but he able to use the LUE as a functional assist for ADLs. He has very limited thumb activation and has to use his R hadn to place his L hand on the RW LUE Sensation: WNL LUE Coordination: decreased fine motor;decreased gross motor   Lower Extremity Assessment Lower Extremity Assessment: Generalized weakness;LLE deficits/detail LLE Deficits / Details: hx of L hemi 2/2 old CVA LLE Coordination: decreased gross motor   Cervical / Trunk Assessment Cervical / Trunk Assessment: Normal   Communication Communication Communication: No apparent difficulties (tangential) Factors Affecting Communication:  (tangential)   Cognition Arousal: Alert Behavior During Therapy: Anxious Cognition: No apparent impairments             OT - Cognition Comments: overall WFL, pt can be verbose and needs cues for attention and re-direction. he is unable to dual task                 Following commands: Intact       Cueing  General Comments   Cueing Techniques: Verbal cues  VSS on RA           Home Living Family/patient expects to be discharged to:: Private residence Living Arrangements: Spouse/significant other Available Help at Discharge: Family;Available 24 hours/day Type of Home: House Home Access: Ramped entrance;Stairs to enter Entrance Stairs-Number of Steps: 2 steps in/out of the man cave with grab bar Entrance Stairs-Rails: Right Home Layout: One level     Bathroom Shower/Tub: Producer, television/film/video: Handicapped height Bathroom Accessibility: Yes How Accessible: Accessible via wheelchair;Accessible via walker Home Equipment: Rolling Walker (2 wheels);Grab bars -  tub/shower;Grab bars - toilet;Rollator (4 wheels);Adaptive equipment;BSC/3in1;Shower seat - built in Adaptive Equipment: Reacher;Long-handled sponge Additional Comments: wife uses wc for all mobility      Prior Functioning/Environment Prior Level of Function : Needs assist             Mobility Comments: RW in the home, rollator outside. Denies recent falls. Just finished HHPT last week ADLs Comments: mod I for most ADLs, wife assists with bathing intermittently    OT Problem List: Decreased strength;Decreased range of motion;Decreased activity tolerance;Impaired balance (sitting and/or standing);Decreased cognition;Decreased safety awareness;Decreased knowledge of use of DME or AE;Decreased knowledge of precautions;Impaired UE functional use   OT Treatment/Interventions: Self-care/ADL training;Therapeutic exercise;Neuromuscular education;DME and/or AE instruction;Therapeutic activities;Patient/family education;Balance training      OT Goals(Current goals can be found in the care plan section)   Acute Rehab OT Goals Patient Stated Goal: to get more rehab OT Goal Formulation: With patient Time For Goal Achievement: 11/15/23 Potential to Achieve Goals: Good ADL Goals Pt Will Perform Grooming: with modified independence Pt Will Perform Upper Body Dressing: with modified independence Pt Will Perform Lower Body Dressing: with supervision;sit to/from stand Pt Will Transfer to Toilet: with modified independence;ambulating   OT Frequency:  Min 2X/week    Co-evaluation PT/OT/SLP Co-Evaluation/Treatment: Yes Reason for Co-Treatment: Complexity of the patient's impairments (multi-system  involvement);For patient/therapist safety PT goals addressed during session: Mobility/safety with mobility;Balance;Proper use of DME OT goals addressed during session: ADL's and self-care      AM-PAC OT 6 Clicks Daily Activity     Outcome Measure Help from another person eating meals?: A  Little Help from another person taking care of personal grooming?: A Little Help from another person toileting, which includes using toliet, bedpan, or urinal?: A Lot Help from another person bathing (including washing, rinsing, drying)?: A Lot Help from another person to put on and taking off regular upper body clothing?: A Lot Help from another person to put on and taking off regular lower body clothing?: A Lot 6 Click Score: 14   End of Session Equipment Utilized During Treatment: Gait belt;Rolling walker (2 wheels) Nurse Communication: Mobility status  Activity Tolerance: Patient tolerated treatment well Patient left: in bed;with call bell/phone within reach  OT Visit Diagnosis: Unsteadiness on feet (R26.81);Other abnormalities of gait and mobility (R26.89);Muscle weakness (generalized) (M62.81);History of falling (Z91.81);Hemiplegia and hemiparesis Hemiplegia - Right/Left: Left Hemiplegia - dominant/non-dominant: Non-Dominant Hemiplegia - caused by: Cerebral infarction                Time: 9076-9048 OT Time Calculation (min): 28 min Charges:  OT General Charges $OT Visit: 1 Visit OT Evaluation $OT Eval Moderate Complexity: 1 Mod  Lucie Kendall, OTR/L Acute Rehabilitation Services Office [phone] Secure Chat Communication Preferred   Lucie JONETTA Kendall 11/01/2023, 10:48 AM

## 2023-11-01 NOTE — Evaluation (Signed)
 Physical Therapy Evaluation Patient Details Name: Joseph Ortega MRN: [id number] DOB: [birth year] Today's Date: 11/01/2023  History of Present Illness  Pt is a 68 y/o M admitted on 10/31/23 after presenting with c/o worsening L sided weakness. Pt is being treated for possible seizure. PMH: HTN, HLD, DM, severe multivessel CAD, hemorrhagic stroke, anxiety, arthritis, depression, sleep apnea, thyroid disease  Clinical Impression  Pt seen for PT evaluation with pt agreeable. Pt reports he was living at home with wife & 3 dogs, recently completed HHPT & was ambulatory with walker. On this date, pt requires min<>mod assist +2 for safety with mobility as pt with impaired balance & coordination.  Pt unsafe to go home without assistance & wife currently unable to physically help. Recommend post acute rehab >3 hours therapy/day upon d/c.      If plan is discharge home, recommend the following: A lot of help with walking and/or transfers;A lot of help with bathing/dressing/bathroom;Assistance with feeding;Assist for transportation;Assistance with cooking/housework;Help with stairs or ramp for entrance   Can travel by private vehicle        Equipment Recommendations None recommended by PT  Recommendations for Other Services  Rehab consult    Functional Status Assessment Patient has had a recent decline in their functional status and demonstrates the ability to make significant improvements in function in a reasonable and predictable amount of time.     Precautions / Restrictions Precautions Precautions: Fall Precaution/Restrictions Comments: has AFO (does not wear it), has L wrist brace (does not wear it) Restrictions Weight Bearing Restrictions Per Provider Order: No      Mobility  Bed Mobility Overal bed mobility: Needs Assistance Bed Mobility: Supine to Sit, Sit to Supine     Supine to sit: Min assist, HOB elevated (exit R side of bed (opposite of what pt is used to doing), assistance  to move LLE to EOB, upright trunk) Sit to supine: Contact guard assist        Transfers Overall transfer level: Needs assistance Equipment used: Rolling walker (2 wheels) Transfers: Sit to/from Stand Sit to Stand: Min assist, +2 physical assistance, +2 safety/equipment           General transfer comment: min A +2 for STS but mod A +2 needed for gait due to multiple LOBs    Ambulation/Gait Ambulation/Gait assistance: Mod assist, Min assist Gait Distance (Feet): 50 Feet Assistive device: Rolling walker (2 wheels) Gait Pattern/deviations: Decreased step length - right, Decreased step length - left, Decreased dorsiflexion - right, Decreased dorsiflexion - left, Decreased stride length, Wide base of support Gait velocity: decreased     General Gait Details: Multiple LOB to R requiring assistance to correct, cuing to ambulate within base of AD & not push walker too far out in front of him.  Stairs            Wheelchair Mobility     Tilt Bed    Modified Rankin (Stroke Patients Only)       Balance Overall balance assessment: Needs assistance Sitting-balance support: Feet supported Sitting balance-Leahy Scale: Fair     Standing balance support: Bilateral upper extremity supported, During functional activity Standing balance-Leahy Scale: Poor                               Pertinent Vitals/Pain Pain Assessment Pain Assessment: No/denies pain    Home Living Family/patient expects to be discharged to:: Private residence Living Arrangements: Spouse/significant other  Available Help at Discharge: Family;Available 24 hours/day Type of Home: House Home Access: Ramped entrance;Stairs to enter Entrance Stairs-Rails: Right Entrance Stairs-Number of Steps: 2 steps in/out of the man cave with grab bar   Home Layout: One level Home Equipment: Rolling Walker (2 wheels);Grab bars - tub/shower;Grab bars - toilet;Rollator (4 wheels);Adaptive  equipment;BSC/3in1;Shower seat - built in Additional Comments: wife uses wc for all mobility    Prior Function Prior Level of Function : Needs assist             Mobility Comments: RW in the home, rollator outside. Denies recent falls. Just finished HHPT last week ADLs Comments: mod I for most ADLs, wife assists with bathing intermittently     Extremity/Trunk Assessment   Upper Extremity Assessment Upper Extremity Assessment: Defer to OT evaluation LUE Deficits / Details: L hemi, limited AROM but he able to use the LUE as a functional assist for ADLs. He has very limited thumb activation and has to use his R hadn to place his L hand on the RW LUE Sensation: WNL LUE Coordination: decreased fine motor;decreased gross motor    Lower Extremity Assessment Lower Extremity Assessment: Generalized weakness;LLE deficits/detail LLE Deficits / Details: hx of L hemi 2/2 old CVA LLE Coordination: decreased gross motor    Cervical / Trunk Assessment Cervical / Trunk Assessment: Normal  Communication   Communication Communication: No apparent difficulties (tangential) Factors Affecting Communication:  (tangential)    Cognition Arousal: Alert Behavior During Therapy: Anxious, WFL for tasks assessed/performed                             Following commands: Intact       Cueing Cueing Techniques: Verbal cues     General Comments General comments (skin integrity, edema, etc.): VSS on RA    Exercises     Assessment/Plan    PT Assessment Patient needs continued PT services  PT Problem List Decreased strength;Cardiopulmonary status limiting activity;Decreased coordination;Decreased range of motion;Decreased activity tolerance;Decreased balance;Decreased mobility       PT Treatment Interventions DME instruction;Balance training;Gait training;Neuromuscular re-education;Stair training;Functional mobility training;Therapeutic activities;Therapeutic exercise;Patient/family  education    PT Goals (Current goals can be found in the Care Plan section)  Acute Rehab PT Goals Patient Stated Goal: get better, walk like he recently was able to PT Goal Formulation: With patient Time For Goal Achievement: 11/15/23 Potential to Achieve Goals: Good    Frequency Min 3X/week     Co-evaluation PT/OT/SLP Co-Evaluation/Treatment: Yes Reason for Co-Treatment: Complexity of the patient's impairments (multi-system involvement);For patient/therapist safety PT goals addressed during session: Mobility/safety with mobility;Balance;Proper use of DME OT goals addressed during session: ADL's and self-care       AM-PAC PT 6 Clicks Mobility  Outcome Measure Help needed turning from your back to your side while in a flat bed without using bedrails?: A Little Help needed moving from lying on your back to sitting on the side of a flat bed without using bedrails?: A Lot Help needed moving to and from a bed to a chair (including a wheelchair)?: A Lot Help needed standing up from a chair using your arms (e.g., wheelchair or bedside chair)?: A Lot Help needed to walk in hospital room?: A Lot Help needed climbing 3-5 steps with a railing? : Total 6 Click Score: 12    End of Session Equipment Utilized During Treatment: Gait belt Activity Tolerance: Patient tolerated treatment well Patient left: in bed;with call bell/phone  within reach   PT Visit Diagnosis: Unsteadiness on feet (R26.81);Difficulty in walking, not elsewhere classified (R26.2);Other abnormalities of gait and mobility (R26.89);Muscle weakness (generalized) (M62.81)    Time: 9045-8985 PT Time Calculation (min) (ACUTE ONLY): 20 min   Charges:   PT Evaluation $PT Eval Moderate Complexity: 1 Mod   PT General Charges $$ ACUTE PT VISIT: 1 Visit         Richerd Pinal, PT, DPT 11/01/23, 10:50 AM   Richerd CHRISTELLA Pinal 11/01/2023, 10:49 AM

## 2023-11-01 NOTE — Assessment & Plan Note (Addendum)
 Current etiology unclear, likely from prior stroke, hypoglycemia and hypertensive emergency which has resolved on admission - start keppra  500mg  BID - order EEG - observe overnight for seizure clustering - PT/OT/SLP evaluation - neuro checks q 4h - aspiration precaution - fall precaution - seizure precaution - MRI brain wo contrast - AM BMP, lipid panel, Mg2+

## 2023-11-01 NOTE — Progress Notes (Addendum)
 Daily Progress Note Intern Pager: [phone]  Patient name: Joseph Ortega Medical record number: [id number] Date of birth: Dec 04, 1955 Age: 68 y.o. Gender: male  Primary Care Provider: Howell Lunger, DO Consultants: Neurology Code Status: FULL  Pt Overview and Major Events to Date:  Admission 8/15  Assessment and Plan: Ulrich Soules is a 68 year old male with a PMHx of CHF EF 35%, prior hemorrhagic stroke, HTN, HLD, and DM who presented with worsening of baseline left-sided weakness along with forehead twitching, left eye deviation suspected to be seizure activity. Plan for today obtaining EEG. Assessment & Plan Seizure [facility]) Unclear etiology, likely from previous history of strokes, hypoglycemia and hypertensive emergency which was resolved on admission. MRI brain shows no acute intracranial abnormality. - Neurology following, appreciate recommendations - Continue Keppra  500mg  BID. - EEG pending - Observe for seizures - Neuro checks q4hrs - PT/OT/SLP pending AKI (acute kidney injury) (HCC) Creatinine on admission is 1.77 and baseline around 1. Currently 1.33. Received bolus NS in ED. Patient appears to be fluid overloaded and says he feels a little puffy. BLE edema. - Lasix  40mg  PO daily - Hold home Losartan  25 mg at bedtime - Strict I&O - Hold IVF - AM BMP - Avoid nephrotoxic agents Type 2 diabetes mellitus (HCC) Blood glucose on admission was 459. Now 448. Most recent A1C in June is 11.8. No concern for DKA/HHS. Home regimen include NovoLog  15-20 units 3 times daily with meals, Insulin  detemir 40 units at bedtime, Trulicity  3 mg / 0.5 mL weekly - Switch to resistant sliding scale insulin  - Lantus  40u at bedtime - Monitor daily CBG - Hgb A1c pending - Continue home cymbalta  40mg  daily, gabapentin  300mg  TID Chronic health problem HTN - continue home carvedilol  12.5 mg BID Hypothyroidism - continue home Levothyroxine  100 mcg daily HLD- continue home Crestor  40  mg daily CAD - continue home aspirin  81mg  daily CHF- continue carvedilol , holding the losartan  for AKI, lasix  40 PO daily  FEN/GI: heart healthy, carb-modified PPx: lovenox  Dispo:awaiting PT/OT eval  Subjective: Reports at baseline weakness since his stroke in the past. Notes that he is not consistent with his home medications. Ambulates at home with a walker and takes care of wife. Denies of any complaints.  Objective: Temp:  [97.7 F (36.5 C)-98 F (36.7 C)] 97.7 F (36.5 C) (08/15 0516) Pulse Rate:  [73-83] 73 (08/15 0600) Resp:  [12-22] 17 (08/15 0600) BP: (159-171)/(75-96) 161/88 (08/15 0600) SpO2:  [91 %-100 %] 91 % (08/15 0600) Weight:  [106.8 kg] 106.8 kg (08/14 2323) Physical Exam: General: Laying on bed, conversational, in no acute distress Neuro: AxOx4. Strength 5/5 in RUE and RLE. Residual chronic left sided weakness. Strength 3/5 in LUE and LLE. Sensation intact. CN II-XII intact. Cardiovascular: RRR no murmurs Respiratory: CTAB breathing comfortably on room air Abdomen: No TTP, soft, non distended Extremities: Left hand contractures. BLE +1 edema  Laboratory: Most recent CBC Lab Results  Component Value Date   WBC 6.2 10/31/2023   HGB 13.3 10/31/2023   HCT 39.0 10/31/2023   MCV 95.3 10/31/2023   PLT 159 10/31/2023   Most recent BMP    Latest Ref Rng & Units 11/01/2023    5:08 AM  BMP  Glucose 70 - 99 mg/dL 551   BUN 8 - 23 mg/dL 29   Creatinine 9.38 - 1.24 mg/dL 8.66   Sodium 864 - 854 mmol/L 139   Potassium 3.5 - 5.1 mmol/L 3.8   Chloride 98 -  111 mmol/L 103   CO2 22 - 32 mmol/L 27   Calcium  8.9 - 10.3 mg/dL 9.1     Other pertinent labs  Gluc [phone] Cr 1.70>1.33 Trop 20>19 Anion gap 9 GFR 59 BNP 510.2  Imaging/Diagnostic Tests: MRI brain wo cont IMPRESSION: 1. No acute intracranial abnormality.   2. 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 Hemosiderin at the anterior right external capsule is new since 2023. Stable small chronic lacunar infarcts including the pons and right cerebellar peduncle.  CT Head Code Stroke: Impression from Radiologist:  1. No acute intracranial abnormality. 2. Aspects is 10. 3. Chronic posterior right cerebral infarct  Wilburt Gwenn Bernida MARLA, Medical Student 11/01/2023, 7:40 AM  AI, [facility] FPTS Intern pager: [phone], text pages welcome Secure chat group [facility] Teaching Service   I was personally present and performed or re-performed the history, physical exam and medical decision making activities of this service and have verified that the service and findings are accurately documented in the student's note.  Edmar Blankenburg, DO                  11/01/2023, 1:12 PM

## 2023-11-02 ENCOUNTER — Other Ambulatory Visit (HOSPITAL_COMMUNITY): Payer: Self-pay

## 2023-11-02 DIAGNOSIS — R569 Unspecified convulsions: Secondary | ICD-10-CM | POA: Diagnosis not present

## 2023-11-02 LAB — GLUCOSE, CAPILLARY
Glucose-Capillary: 145 mg/dL — ABNORMAL HIGH (ref 70–99)
Glucose-Capillary: 235 mg/dL — ABNORMAL HIGH (ref 70–99)
Glucose-Capillary: 149 mg/dL — ABNORMAL HIGH (ref 70–99)
Glucose-Capillary: 97 mg/dL (ref 70–99)

## 2023-11-02 LAB — BASIC METABOLIC PANEL WITH GFR
Anion gap: 5 (ref 5–15)
BUN: 34 mg/dL — ABNORMAL HIGH (ref 8–23)
CO2: 29 mmol/L (ref 22–32)
Calcium: 8.8 mg/dL — ABNORMAL LOW (ref 8.9–10.3)
Chloride: 103 mmol/L (ref 98–111)
Creatinine, Ser: 0.97 mg/dL (ref 0.61–1.24)
GFR, Estimated: >60 mL/min (ref >60)
Glucose, Bld: 145 mg/dL — ABNORMAL HIGH (ref 70–99)
Potassium: 3.6 mmol/L (ref 3.5–5.1)
Sodium: 137 mmol/L (ref 135–145)

## 2023-11-02 LAB — MAGNESIUM: Magnesium: 2 mg/dL (ref 1.7–2.4)

## 2023-11-02 MED ORDER — LOSARTAN POTASSIUM 25 MG PO TABS
25.0000 mg | ORAL_TABLET | Freq: Every day | ORAL | Status: DC
  Administered 2023-11-02: 25 mg via ORAL
  Filled 2023-11-02: qty 1

## 2023-11-02 MED ORDER — LEVETIRACETAM 500 MG PO TABS
500.0000 mg | ORAL_TABLET | Freq: Two times a day (BID) | ORAL | 1 refills | Status: AC
  Filled 2023-11-02: qty 60, 30d supply, fill #0

## 2023-11-02 NOTE — TOC Initial Note (Signed)
 Transition of Care [facility]) - Initial/Assessment Note    Patient Details  Name: Joseph Ortega MRN: [id number] Date of Birth: [birth year]  Transition of Care [facility]) CM/SW Contact:    Inocente GORMAN Kindle, LCSW Phone Number: 11/02/2023, 11:22 AM  Clinical Narrative:                 CSW received consult for possible SNF at time of discharge. CSW spoke with patient. Patient reported that he does not want SNF and would like home health services instead. Patient reported he just finished with [facility] and would like them again. CSW discussed equipment needs and patient reported he has a rolling walker and a rollator. He declined other DME needs and stated he uses a urinal at home since he is on lasix . CSW confirmed PCP and address and discussed life alert as patient had recently received contact numbers for that; CSW placed numbers on AVS for follow up. Patient requests [facility] for transport at discharge. Patient provided permission to follow up with his wife as needed. Will follow up with RNCM.    Expected Discharge Plan: Home w Home Health Services Barriers to Discharge: Continued Medical Work up   Patient Goals and CMS Choice Patient states their goals for this hospitalization and ongoing recovery are:: Return home CMS Medicare.gov Compare Post Acute Care list provided to:: Patient Choice offered to / list presented to : Patient [place] ownership interest in [facility].provided to:: Patient    Expected Discharge Plan and Services In-house Referral: Clinical Social Work   Post Acute Care Choice: Home Health Living arrangements for the past 2 months: Single Family Home                                      Prior Living Arrangements/Services Living arrangements for the past 2 months: Single Family Home Lives with:: Spouse Patient language and need for interpreter reviewed:: Yes Do you feel safe going back to the place where you live?: Yes      Need for Family Participation  in Patient Care: No (Comment) Care giver support system in place?: Yes (comment) Current home services: DME Criminal Activity/Legal Involvement Pertinent to Current Situation/Hospitalization: No - Comment as needed  Activities of Daily Living   ADL Screening (condition at time of admission) Independently performs ADLs?: Yes (appropriate for developmental age) Is the patient deaf or have difficulty hearing?: Yes (no hearing aids) Does the patient have difficulty seeing, even when wearing glasses/contacts?: Yes (needs glasses) Does the patient have difficulty concentrating, remembering, or making decisions?: Yes  Permission Sought/Granted Permission sought to share information with : Facility Medical sales representative, Family Supports Permission granted to share information with : Yes, Verbal Permission Granted     Permission granted to share info w AGENCY: HH  Permission granted to share info w Relationship: Spouse     Emotional Assessment Appearance:: Appears stated age Attitude/Demeanor/Rapport: Engaged Affect (typically observed): Accepting, Pleasant, Appropriate Orientation: : Oriented to Self, Oriented to Place, Oriented to  Time, Oriented to Situation Alcohol / Substance Use: Not Applicable Psych Involvement: No (comment)  Admission diagnosis:  Seizure (HCC) [R56.9] Hyperglycemia [R73.9] Left-sided weakness [R53.1] Seizure-like activity (HCC) [R56.9] Patient Active Problem List   Diagnosis Date Noted   Seizure disorder (HCC) 11/01/2023   Seizure (HCC) 11/01/2023   Seizure-like activity (HCC) 11/01/2023   Coronary artery disease involving native coronary artery of native heart 09/19/2023  Congestive heart failure (HCC) 09/19/2023   Hypertrophic cardiomyopathy (HCC) 09/18/2023   AKI (acute kidney injury) (HCC) 09/17/2023   Acute systolic heart failure (HCC) 09/16/2023   Abnormal echocardiogram 09/16/2023   Bilateral edema of lower extremity 09/13/2023   Chronic health  problem 09/13/2023   Heart failure with mildly reduced ejection fraction (HFmrEF) (HCC) 05/31/2023   Tinea pedis 03/11/2023   History of peripheral edema 03/11/2023   Current mild episode of major depressive disorder (HCC) 03/04/2023   TBI (traumatic brain injury) (HCC) 02/26/2023   Cervical stenosis of spine 08/15/2022   Hyperlipidemia 05/07/2022   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   Stroke (cerebrum) (HCC) 05/02/2022   ICH (intracerebral hemorrhage) (HCC) 03/17/2021   Hypothyroidism 03/12/2021   OSA (obstructive sleep apnea) 03/12/2021   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   Type 2 diabetes mellitus (HCC) 03/12/2021   Morbid obesity (HCC) 03/11/2021   Hypertension 03/11/2021   Tobacco abuse 03/11/2021   Herpes zoster with complication    SAH (subarachnoid hemorrhage) (HCC) 03/08/2021   PCP:  Howell Lunger, DO Pharmacy:   [id number] - [place] [place] - [address] [place] [address] Phone: [phone] Fax: [phone]  [facility] [address] [place] [address] Phone: [phone] Fax: [phone]     Social Drivers of Health (SDOH) Social History: SDOH Screenings   Food Insecurity: No Food Insecurity (11/01/2023)  Housing: Low Risk  (11/01/2023)  Transportation Needs: Unmet Transportation Needs (11/01/2023)  Utilities: Not At Risk (11/01/2023)  Alcohol Screen: Low Risk  (09/19/2023)  Depression (PHQ2-9): Low Risk  (04/24/2023)  Financial Resource Strain: Low Risk  (09/19/2023)  Social Connections: Moderately Isolated (11/01/2023)  Tobacco Use: Low Risk  (11/01/2023)   SDOH Interventions:     Readmission Risk Interventions    09/16/2023   10:59 AM  Readmission Risk Prevention Plan  Post Dischage Appt Complete  Medication Screening Complete  Transportation Screening Complete

## 2023-11-02 NOTE — TOC CM/SW Note (Addendum)
 Met with pt at bedside to discuss the DC plan. Pt reports that he is returning home with the support of his wife. He wants to continue [facility] services with [facility]. Asked pt if I could contact his wife to discuss the DC plan and pt agreed. Contacted pt's wife and she confirmed the DC plan. She also wants [facility] to provide [facility] RN/PT/aide. She reports that they have the support of family and friends. Pt is asking for an ambulance at time of DC. Notified Dr.Smith of the DC plan. Contacted Corey at [facility] and he accepted the referral.

## 2023-11-02 NOTE — Evaluation (Signed)
 Speech Language Pathology Evaluation Patient Details Name: Joseph Ortega MRN: [id number] DOB: 18-Sep-1955 Today's Date: 11/02/2023 Time: 9052-8989 SLP Time Calculation (min) (ACUTE ONLY): 23 min  Problem List:  Patient Active Problem List   Diagnosis Date Noted   Seizure disorder (HCC) 11/01/2023   Seizure (HCC) 11/01/2023   Seizure-like activity (HCC) 11/01/2023   Coronary artery disease involving native coronary artery of native heart 09/19/2023   Congestive heart failure (HCC) 09/19/2023   Hypertrophic cardiomyopathy (HCC) 09/18/2023   AKI (acute kidney injury) (HCC) 09/17/2023   Acute systolic heart failure (HCC) 09/16/2023   Abnormal echocardiogram 09/16/2023   Bilateral edema of lower extremity 09/13/2023   Chronic health problem 09/13/2023   Heart failure with mildly reduced ejection fraction (HFmrEF) (HCC) 05/31/2023   Tinea pedis 03/11/2023   History of peripheral edema 03/11/2023   Current mild episode of major depressive disorder (HCC) 03/04/2023   TBI (traumatic brain injury) (HCC) 02/26/2023   Cervical stenosis of spine 08/15/2022   Hyperlipidemia 05/07/2022   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   Stroke (cerebrum) (HCC) 05/02/2022   ICH (intracerebral hemorrhage) (HCC) 03/17/2021   Hypothyroidism 03/12/2021   OSA (obstructive sleep apnea) 03/12/2021   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   Type 2 diabetes mellitus (HCC) 03/12/2021   Morbid obesity (HCC) 03/11/2021   Hypertension 03/11/2021   Tobacco abuse 03/11/2021   Herpes zoster with complication    SAH (subarachnoid hemorrhage) (HCC) 03/08/2021   Past Medical History:  Past Medical History:  Diagnosis Date   Anxiety    Arthritis    Depression    Diabetes mellitus without complication (HCC)    Hypertensive crisis 02/22/2023   Hypothyroidism    Sleep apnea    wears CPAP   Stroke (HCC)    1 stroke 2/24, and 2 strokes on 03/09/21   Thyroid disease    Past Surgical History:  Past  Surgical History:  Procedure Laterality Date   ANTERIOR CERVICAL DECOMP/DISCECTOMY FUSION N/A 11/23/2022   Procedure: Anterior Cervical Decompression Fusion  Cervical four-five;  Surgeon: Lanis Pupa, MD;  Location: [facility] OR;  Service: Neurosurgery;  Laterality: N/A;   GROIN DEBRIDEMENT Left    ingrown hair, possibly abscesses, surgery to treat infection   HERNIA REPAIR Left    inguinal   IR ANGIO INTRA EXTRACRAN SEL COM CAROTID INNOMINATE BILAT MOD SED  03/09/2021   IR ANGIO VERTEBRAL SEL VERTEBRAL BILAT MOD SED  03/09/2021   IR US  GUIDE VASC ACCESS RIGHT  03/09/2021   LOOP RECORDER INSERTION N/A 05/07/2022   Procedure: LOOP RECORDER INSERTION;  Surgeon: Cindie Ole DASEN, MD;  Location: [facility] INVASIVE CV LAB;  Service: Cardiovascular;  Laterality: N/A;   RIGHT HEART CATH AND CORONARY ANGIOGRAPHY N/A 09/18/2023   Procedure: RIGHT HEART CATH AND CORONARY ANGIOGRAPHY;  Surgeon: Elmira Newman PARAS, MD;  Location: [facility] INVASIVE CV LAB;  Service: Cardiovascular;  Laterality: N/A;   HPI:  Rafael Salway is a 68 y.o. male who on 11/01/23 presented with worsening of baseline left sided weakness.   Differential for this patient's presentation of this includes stroke, seizure, and ACS.  Low suspicion for stroke given CT head was negative for any acute intracranial abnormalities. Low suspicion of ACS given EKG without ischemic changes, troponins negative and patient denying CP. Metabolic panel did not show any sitgnificant electrolyte derrangement, CBC without anemia.     Patient presentation with EMS- left eye deviation, forehead twitching and left arm stiffness that improved with Versed  and Keppra  make seizure a higher  likelihood for his presentation. ST consulted for speech/language cognitive assessment.   Assessment / Plan / Recommendation Clinical Impression  Pt seen for speech/language cognitive assessment with ST. [place] [employer] Mental Status Examination (SLUMS) administered with a total score  typical on this assessment being a 27/30.  Pt received a 24/30, but has a hx of memory recall deficits d/t prior CVAs per report.  No family available to confirm or deny claims.  Pt exhibited difficulty with sustained attention with simple calculation and constructing a clock, but visual field deficit (L peripheral field cut) and lack of reading glasses may have impacted performance.  Pt able to recall independently 3/5 objects after a time delay and achieved 100% with paragraph retention.  Digit reversal decreased with >3 digit numbers d/t recall issue.  Pt spoke with 100% intelligibility within complex conversation and OME unremarkable with exception of L facial twitching/weakness initially upon arrival.  Pt stated this has almost resolved and doesn't affect speech/swallowing. Pt able to problem solve for simple situations (ie: call bell, pain) and is aware of deficits and no longer drives/relies on others for A prn at home.  Pt is likely at baseline level for cognitive functioning, so ST will s/o in acute setting.  Thank you for this consult.    SLP Assessment  SLP Recommendation/Assessment: Patient does not need any further Speech Language Pathology Services SLP Visit Diagnosis: Cognitive communication deficit (R41.841)     Assistance Recommended at Discharge  PRN  Functional Status Assessment Patient has had a recent decline in their functional status and demonstrates the ability to make significant improvements in function in a reasonable and predictable amount of time.  Frequency and Duration Other (Comment) (evaluation only)         SLP Evaluation Cognition  Overall Cognitive Status: History of cognitive impairments - at baseline Arousal/Alertness: Awake/alert Orientation Level: Oriented X4 Attention: Sustained Sustained Attention: Impaired Sustained Attention Impairment: Verbal basic;Functional basic Memory: Impaired Memory Impairment: Retrieval deficit;Decreased recall of new  information;Decreased short term memory Decreased Short Term Memory: Verbal basic;Functional basic Awareness: Appears intact Problem Solving: Appears intact Behaviors: Perseveration Safety/Judgment: Appears intact       Comprehension  Auditory Comprehension Overall Auditory Comprehension: Appears within functional limits for tasks assessed Commands: Within Functional Limits Conversation: Complex Interfering Components: Attention;Visual impairments;Working Radio broadcast assistant: Dietitian: Exceptions to [facility] Other Visual Recogniton/Discrimination Comments: L peripheral field cut; did not have reading glasses present for evaluation Reading Comprehension Reading Status: Not tested    Expression Expression Primary Mode of Expression: Verbal Verbal Expression Overall Verbal Expression: Appears within functional limits for tasks assessed Level of Generative/Spontaneous Verbalization: Conversation Naming: No impairment Pragmatics: No impairment Non-Verbal Means of Communication: Not applicable Written Expression Dominant Hand: Right Written Expression: Exceptions to [facility] Interfering Components: Other (comment) (L periphery field cut; no reading glasses available)   Oral / Motor  Oral Motor/Sensory Function Overall Oral Motor/Sensory Function: Within functional limits Motor Speech Overall Motor Speech: Appears within functional limits for tasks assessed Respiration: Within functional limits Phonation: Normal Resonance: Within functional limits Articulation: Within functional limitis Intelligibility: Intelligible Motor Planning: Within functional limits Motor Speech Errors: Not applicable            Pat Maecy Podgurski,M.S.,CCC-SLP 11/02/2023, 1:35 PM

## 2023-11-02 NOTE — Discharge Summary (Signed)
 Family Medicine Teaching [facility] Discharge Summary  Patient name: Joseph Ortega Medical record number: [id number] Date of birth: 03-03-1956 Age: 68 y.o. Gender: male Date of Admission: 10/31/2023  Date of Discharge: 11/02/2023 Admitting Physician: Laymon JINNY Legions, MD  Primary Care Provider: Howell Lunger, DO Consultants: Neurology  Indication for Hospitalization: Seizure activity  Brief Hospital Course:  [name] is a 68 y.o.male with a history of CHF EF 35%, prior hemorrhagic stroke, HTN, HLD, and DM who was admitted to the [facility] Medicine Teaching Service at [facility] for seizure. His hospital course is detailed below:  Seizure Patient presented with worsening of baseline left-sided weakness along with forehead twitching and left eye deviation suspected to be seizure activity. Workup is negative for stroke or ACS. Neurology was consulted and is following patient and recommended Keppra  500mg  BID and EEG consistent with post-stroke epilepsy . PT/OT/SLP consulted, initially attempted SNF however patient did not agree to SNF placement-instead opted for home health services.  Discharged on Keppra  500 mg twice daily, with recommendation for neurology follow-up outpatient.  AKI Baseline creatinine is 1, on admission was 1.77>1.33. He received a bolus of NS in ED and after appeared fluid overloaded. Given lasix  daily and held losartan . Strict I&O, held IVF, and avoided nephrotoxic agents.  AKI resolved, losartan  restarted.  Hydrochlorothiazide  and potassium remained held until PCP follow-up.  DM II Blood glucose on admission was 459>448. Most recent A1C in June 2025 was 11.8. Current A1C 10.9.  Not concerned for DKA/HHS. Switched to resistant sliding scale insulin  with lantus  40u at bedtime. Monitored daily CBGs.  Patient was sent home with this home regimen.  Other chronic conditions were medically managed with home medications and formulary alternatives as necessary (HTN,  hypothyroidism, HLD, CAD,CHF)  PCP Follow-up Recommendations:  DM regimen, would likely benefit from our pharmacy clinic at family medicine center Ensure follow-up with neurology Consider repeat BMP, consider restarting hydrochlorothiazide  if more blood pressure control is needed Reevaluate necessity of trazodone   Disposition: Home with home health  Discharge Condition: Stable  Discharge Exam: Per progress note 11/02/2023  General: Chronically ill-appearing, no acute distress Cardio: RRR, no murmur on exam Pulm: clear, No increased work of breathing Abdomen: Soft, bowel sounds present, nontender Extremity: trace peripheral edema   Significant Procedures: None  Significant Labs and Imaging:  Recent Labs  Lab 10/31/23 2307 10/31/23 2311 10/31/23 2357  WBC 6.2  --   --   HGB 14.2 14.6 13.3  HCT 42.4 43.0 39.0  PLT 159  --   --    Recent Labs  Lab 10/31/23 2307 10/31/23 2311 10/31/23 2357 11/01/23 0508 11/02/23 0522  NA 135 138 138 139 137  K 4.2 4.2 4.1 3.8 3.6  CL 100 101  --  103 103  CO2 24  --   --  27 29  GLUCOSE 492* 495*  --  448* 145*  BUN 35* 36*  --  29* 34*  CREATININE 1.77* 1.70*  --  1.33* 0.97  CALCIUM  9.1  --   --  9.1 8.8*  MG  --   --   --  1.9 2.0  ALKPHOS 119  --   --   --   --   AST 20  --   --   --   --   ALT 10  --   --   --   --   ALBUMIN 3.6  --   --   --   --  Results/Tests Pending at Time of Discharge: None  Discharge Medications:  Allergies as of 11/02/2023   No Known Allergies      Medication List     PAUSE taking these medications    guanFACINE 2 MG Tb24 ER tablet Wait to take this until your doctor or other care provider tells you to start again. Commonly known as: INTUNIV Take 2 mg by mouth daily.   hydrochlorothiazide  12.5 MG tablet Wait to take this until your doctor or other care provider tells you to start again. Commonly known as: HYDRODIURIL  Take 1 tablet (12.5 mg total) by mouth 2 (two) times daily. What  changed: when to take this   potassium chloride  SA 20 MEQ tablet Wait to take this until your doctor or other care provider tells you to start again. Commonly known as: Klor-Con  M20 Take 1 tablet (20 mEq total) by mouth daily.   traZODone  100 MG tablet Wait to take this until your doctor or other care provider tells you to start again. Commonly known as: DESYREL  Take 100 mg by mouth at bedtime as needed for sleep.       TAKE these medications    acetaminophen  325 MG tablet Commonly known as: TYLENOL  Take 1-2 tablets (325-650 mg total) by mouth every 4 (four) hours as needed for mild pain (pain score 1-3).   Alive Mens Gummy Multivitamins Chew Chew 3 tablets by mouth daily.   ascorbic acid  500 MG tablet Commonly known as: VITAMIN C  Take 1 tablet (500 mg total) by mouth daily with supper.   aspirin  EC 81 MG tablet Take 1 tablet (81 mg total) by mouth daily. Swallow whole.   busPIRone  10 MG tablet Commonly known as: BUSPAR  Take 10 mg by mouth 3 (three) times daily.   carvedilol  12.5 MG tablet Commonly known as: COREG  TAKE 1 TABLET (12.5MG  TOTAL) BY MOUTH TWICE A DAY WITH MEALS   docusate sodium  100 MG capsule Commonly known as: COLACE Take 100 mg by mouth daily.   DULoxetine  HCl 40 MG Cpep Take 40 mg by mouth daily.   furosemide  40 MG tablet Commonly known as: LASIX  Take 1 tablet (40 mg total) by mouth daily.   gabapentin  300 MG capsule Commonly known as: Neurontin  Take 1 capsule (300 mg total) by mouth 3 (three) times daily.   insulin  detemir 100 UNIT/ML FlexPen Commonly known as: LEVEMIR  Inject 40 Units into the skin at bedtime.   levETIRAcetam  500 MG tablet Commonly known as: KEPPRA  Take 1 tablet (500 mg total) by mouth 2 (two) times daily.   levothyroxine  100 MCG tablet Commonly known as: SYNTHROID  Take 1 tablet (100 mcg total) by mouth daily at 6 (six) AM.   losartan  25 MG tablet Commonly known as: COZAAR  Take 1 tablet (25 mg total) by mouth at  bedtime.   NovoLOG  FlexPen 100 UNIT/ML FlexPen Generic drug: insulin  aspart Inject 15-20 Units into the skin 3 (three) times daily with meals. Inject 15-20 units into the skin 3 times daily with meals   rosuvastatin  40 MG tablet Commonly known as: CRESTOR  Take 1 tablet (40 mg total) by mouth daily.   Trulicity  3 MG/0.5ML Soaj Generic drug: Dulaglutide  Inject 3 mg into the skin once a week. What changed: additional instructions        Discharge Instructions: Please refer to Patient Instructions section of EMR for full details.  Patient was counseled important signs and symptoms that should prompt return to medical care, changes in medications, dietary instructions, activity restrictions, and follow  up appointments.   Follow-Up Appointments:  Follow-up Information     Care, [facility] Follow up.   Specialty: Home Health Services Why: [facility] will provide home health nursing and physical therapy. Someone from the agency will be contacting you to arrange your first appointment. Contact information: [address] [place] [address] [phone]                 Howell Lunger, DO 11/02/2023, 2:52 PM PGY-3, [facility] Family Medicine

## 2023-11-02 NOTE — Discharge Instructions (Addendum)
 Life Alert Systems:   -Medical Care Alert: [phone]  -[facility]: [phone]  -[employer]:[address], [place], [place]  [address]. Phone: [phone]  -[employer] [place]: [phone]. Address: [address], [place], [address]  -[facility] Alert: [phone]  -MedAlert: [phone] Alert Systems:   -Medical Care Alert: [phone]  -[facility]: [phone]  -[employer]:[address], [place], [place]  [address]. Phone: [phone]  -[employer] [place]: [phone]. Address: [address], [place], [address]  -[facility] Medical Alert: [phone]  -MedAlert: [phone]   Dear Salomon Jama Bream,  Thank you for letting us  participate in your care.  POST-HOSPITAL & CARE INSTRUCTIONS Please continue to take Keppra  500 mg twice daily You will need to follow-up with neurology outpatient Go to your follow up appointments (listed below)   DOCTOR'S APPOINTMENT   Future Appointments  Date Time Provider Department Center  11/07/2023  3:15 PM Lorrane Pac, MD [facility]  11/25/2023  7:10 AM CVD HVT DEVICE REMOTES [facility] H&V  12/19/2023 10:45 AM Whitfield Raisin, NP [facility] None  12/30/2023  7:10 AM CVD HVT DEVICE REMOTES [facility] H&V  02/03/2024  7:10 AM CVD HVT DEVICE REMOTES [facility] H&V  03/09/2024  7:10 AM CVD HVT DEVICE REMOTES [facility] H&V  04/13/2024  7:10 AM CVD HVT DEVICE REMOTES [facility] H&V    Follow-up Information     Care, [facility] Follow up.   Specialty: Home Health Services Why: [facility] will provide home health nursing and physical therapy. Someone from the agency will be contacting you to arrange your first appointment. Contact information: [address] [place] [address] [phone]                 Take care and be well!  Family Medicine Teaching Service Inpatient Team [place]  [facility]  [address] [place], [place] [address] [phone]

## 2023-11-02 NOTE — TOC CM/SW Note (Addendum)
 Pt has been DC. Contacted pt's wife, discussed the DC plan for today via ambulance. She verified the home address. Arranged ambulance transportation through [facility]. RN notified.

## 2023-11-02 NOTE — NC FL2 (Signed)
 [place]  MEDICAID FL2 LEVEL OF CARE FORM     IDENTIFICATION  Patient Name: Joseph Ortega Birthdate: [birth year] Sex: male Admission Date (Current Location): 10/31/2023  [facility] and [place] Number:  Producer, television/film/video and Address:  [facility] [place], [address], [place], [place] [address]      Provider Number: [id number]  Attending Physician Name and Address:  Donah Laymon PARAS, MD  Relative Name and Phone Number:       Current Level of Care: Hospital Recommended Level of Care: Skilled Nursing Facility Prior Approval Number:    Date Approved/Denied:   PASRR Number: [id number]  Discharge Plan: SNF    Current Diagnoses: Patient Active Problem List   Diagnosis Date Noted   Seizure disorder (HCC) 11/01/2023   Seizure (HCC) 11/01/2023   Seizure-like activity (HCC) 11/01/2023   Coronary artery disease involving native coronary artery of native heart 09/19/2023   Congestive heart failure (HCC) 09/19/2023   Hypertrophic cardiomyopathy (HCC) 09/18/2023   AKI (acute kidney injury) (HCC) 09/17/2023   Acute systolic heart failure (HCC) 09/16/2023   Abnormal echocardiogram 09/16/2023   Bilateral edema of lower extremity 09/13/2023   Chronic health problem 09/13/2023   Heart failure with mildly reduced ejection fraction (HFmrEF) (HCC) 05/31/2023   Tinea pedis 03/11/2023   History of peripheral edema 03/11/2023   Current mild episode of major depressive disorder (HCC) 03/04/2023   TBI (traumatic brain injury) (HCC) 02/26/2023   Cervical stenosis of spine 08/15/2022   Hyperlipidemia 05/07/2022   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   Stroke (cerebrum) (HCC) 05/02/2022   ICH (intracerebral hemorrhage) (HCC) 03/17/2021   Hypothyroidism 03/12/2021   OSA (obstructive sleep apnea) 03/12/2021   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   Type 2 diabetes mellitus (HCC) 03/12/2021   Morbid obesity (HCC) 03/11/2021   Hypertension 03/11/2021    Tobacco abuse 03/11/2021   Herpes zoster with complication    SAH (subarachnoid hemorrhage) (HCC) 03/08/2021    Orientation RESPIRATION BLADDER Height & Weight     Self, Time, Situation, Place  Normal Continent Weight: 221 lb 12.5 oz (100.6 kg) Height:  6' (182.9 cm)  BEHAVIORAL SYMPTOMS/MOOD NEUROLOGICAL BOWEL NUTRITION STATUS      Continent Diet (See dc summary)  AMBULATORY STATUS COMMUNICATION OF NEEDS Skin   Limited Assist Verbally Normal                       Personal Care Assistance Level of Assistance  Bathing, Feeding, Dressing Bathing Assistance: Limited assistance Feeding assistance: Independent Dressing Assistance: Limited assistance     Functional Limitations Info  Sight Sight Info: Impaired        SPECIAL CARE FACTORS FREQUENCY  PT (By licensed PT), OT (By licensed OT)     PT Frequency: 5x/week OT Frequency: 5x/week            Contractures Contractures Info: Not present    Additional Factors Info  Code Status, Allergies Code Status Info: Full Allergies Info: NKA           Current Medications (11/02/2023):  This is the current hospital active medication list Current Facility-Administered Medications  Medication Dose Route Frequency Provider Last Rate Last Admin    stroke: early stages of recovery book   Does not apply Once Cleotilde Perkins, DO       acetaminophen  (TYLENOL ) tablet 650 mg  650 mg Oral Q4H PRN Cleotilde Perkins, DO       Or   acetaminophen  (TYLENOL )  160 MG/5ML solution 650 mg  650 mg Per Tube Q4H PRN Cleotilde Perkins, DO       Or   acetaminophen  (TYLENOL ) suppository 650 mg  650 mg Rectal Q4H PRN Cleotilde Perkins, DO       aspirin  EC tablet 81 mg  81 mg Oral Daily Cleotilde Perkins, DO   81 mg at 11/01/23 0945   busPIRone  (BUSPAR ) tablet 10 mg  10 mg Oral TID Cleotilde Perkins, DO   10 mg at 11/01/23 2128   carvedilol  (COREG ) tablet 12.5 mg  12.5 mg Oral BID WC Cleotilde Perkins, DO   12.5 mg at 11/01/23 1617   DULoxetine  (CYMBALTA ) DR capsule 40 mg   40 mg Oral Daily Cleotilde Perkins, DO   40 mg at 11/01/23 0945   enoxaparin  (LOVENOX ) injection 50 mg  50 mg Subcutaneous Q24H Cleotilde Perkins, DO   50 mg at 11/01/23 0945   furosemide  (LASIX ) tablet 40 mg  40 mg Oral Daily Cleotilde Perkins, DO       gabapentin  (NEURONTIN ) capsule 300 mg  300 mg Oral TID Cleotilde Perkins, DO   300 mg at 11/01/23 2128   insulin  aspart (novoLOG ) injection 0-20 Units  0-20 Units Subcutaneous TID WC Howell Lunger, DO   3 Units at 11/02/23 9367   insulin  aspart (novoLOG ) injection 0-5 Units  0-5 Units Subcutaneous QHS Cleotilde Perkins, DO       insulin  glargine-yfgn (SEMGLEE ) injection 40 Units  40 Units Subcutaneous Q24H Laron Agent, RPH       levETIRAcetam  (KEPPRA ) tablet 500 mg  500 mg Oral BID Khaliqdina, Salman, MD   500 mg at 11/01/23 2128   Or   levETIRAcetam  (KEPPRA ) undiluted injection 500 mg  500 mg Intravenous BID Khaliqdina, Salman, MD       levothyroxine  (SYNTHROID ) tablet 100 mcg  100 mcg Oral Q0600 Cleotilde Perkins, DO   100 mcg at 11/02/23 9367   losartan  (COZAAR ) tablet 25 mg  25 mg Oral Daily Cleotilde Perkins, DO       rosuvastatin  (CRESTOR ) tablet 40 mg  40 mg Oral Daily Cleotilde Perkins, DO   40 mg at 11/01/23 0945   senna-docusate (Senokot-S) tablet 1 tablet  1 tablet Oral QHS PRN Cleotilde Perkins, DO         Discharge Medications: Please see discharge summary for a list of discharge medications.  Relevant Imaging Results:  Relevant Lab Results:   Additional Information SSN: [id number]  Inocente RAMAN Alicianna Litchford, LCSW

## 2023-11-02 NOTE — Progress Notes (Signed)
 [facility] came to pick up the pt. All pt's belonging and documents sent with the pt.

## 2023-11-02 NOTE — Plan of Care (Signed)
   Problem: Fluid Volume: Goal: Ability to maintain a balanced intake and output will improve Outcome: Progressing   Problem: Health Behavior/Discharge Planning: Goal: Ability to manage health-related needs will improve Outcome: Progressing   Problem: Metabolic: Goal: Ability to maintain appropriate glucose levels will improve Outcome: Progressing

## 2023-11-02 NOTE — Plan of Care (Signed)
 Spoke with patient's spouse.  Updated diagnosis and plan.  She expresses agreement understanding with plan.

## 2023-11-04 ENCOUNTER — Telehealth: Payer: Self-pay

## 2023-11-04 NOTE — Transitions of Care (Post Inpatient/ED Visit) (Unsigned)
   11/04/2023  Name: Joseph Ortega MRN: [id number] DOB: [birth year]  Today's TOC FU Call Status: Today's TOC FU Call Status:: Unsuccessful Call (1st Attempt) Unsuccessful Call (1st Attempt) Date: 11/04/23  Attempted to reach the patient regarding the most recent Inpatient/ED visit.  Follow Up Plan: Additional outreach attempts will be made to reach the patient to complete the Transitions of Care (Post Inpatient/ED visit) call.   Signature Julian Lemmings, LPN [facility] Nurse Health Advisor Direct Dial [phone]

## 2023-11-05 NOTE — Transitions of Care (Post Inpatient/ED Visit) (Signed)
 11/05/2023  Name: Joseph Ortega MRN: [id number] DOB: [birth year]  Today's TOC FU Call Status: Today's TOC FU Call Status:: Successful TOC FU Call Completed Unsuccessful Call (1st Attempt) Date: 11/04/23 [facility] FU Call Complete Date: 11/05/23 Patient's Name and Date of Birth confirmed.  Transition Care Management Follow-up Telephone Call Date of Discharge: 11/02/23 Discharge Facility: [facility]) Type of Discharge: Inpatient Admission Primary Inpatient Discharge Diagnosis:: convulsion How have you been since you were released from the hospital?: Better Any questions or concerns?: No  Items Reviewed: Did you receive and understand the discharge instructions provided?: Yes Medications obtained,verified, and reconciled?: Yes (Medications Reviewed) Any new allergies since your discharge?: No Dietary orders reviewed?: Yes Do you have support at home?: No  Medications Reviewed Today: Medications Reviewed Today     Reviewed by Emmitt Pan, LPN (Licensed Practical Nurse) on 11/05/23 at 1215  Med List Status: <None>   Medication Order Taking? Sig Documenting Provider Last Dose Status Informant  acetaminophen  (TYLENOL ) 325 MG tablet [id number] Yes Take 1-2 tablets (325-650 mg total) by mouth every 4 (four) hours as needed for mild pain (pain score 1-3). Maurice Sharlet RAMAN, PA-C  Active Self, Pharmacy Records  ascorbic acid  (VITAMIN C ) 500 MG tablet [id number] Yes Take 1 tablet (500 mg total) by mouth daily with supper. Maurice Sharlet RAMAN, PA-C  Active Self, Pharmacy Records  aspirin  EC 81 MG tablet [id number] Yes Take 1 tablet (81 mg total) by mouth daily. Swallow whole. Sethi, Pramod S, MD  Active Self, Pharmacy Records  busPIRone  (BUSPAR ) 10 MG tablet [id number] Yes Take 10 mg by mouth 3 (three) times daily. [provider]  Active Self, Pharmacy Records  carvedilol  (COREG ) 12.5 MG tablet [id number] Yes TAKE 1 TABLET (12.5MG  TOTAL) BY MOUTH TWICE A DAY WITH MEALS Babs Arthea DASEN, MD   Active Self, Pharmacy Records  docusate sodium  (COLACE) 100 MG capsule [id number] Yes Take 100 mg by mouth daily. [provider]  Active Self, Pharmacy Records  Dulaglutide  (TRULICITY ) 3 MG/0.5ML SOPN [id number]  Inject 3 mg into the skin once a week.  Patient not taking: Reported on 11/05/2023   Pegge Toribio PARAS, PA-C  Active Self, Pharmacy Records  DULoxetine  HCl 40 MG CPEP [id number] Yes Take 40 mg by mouth daily. [provider]  Active Self, Pharmacy Records  furosemide  (LASIX ) 40 MG tablet [id number] Yes Take 1 tablet (40 mg total) by mouth daily. Alba Sharper, MD  Active Self, Pharmacy Records  gabapentin  (NEURONTIN ) 300 MG capsule [id number] Yes Take 1 capsule (300 mg total) by mouth 3 (three) times daily. Babs Arthea DASEN, MD  Active Self, Pharmacy Records  guanFACINE (INTUNIV) 2 MG TB24 ER tablet [id number] Yes Take 2 mg by mouth daily. [provider]  Active Self, Pharmacy Records  hydrochlorothiazide  (HYDRODIURIL ) 12.5 MG tablet [id number] Yes Take 1 tablet (12.5 mg total) by mouth 2 (two) times daily.  Patient taking differently: Take 12.5 mg by mouth 3 (three) times daily.   Maurice Sharlet RAMAN, PA-C  Active Self, Pharmacy Records  insulin  detemir (LEVEMIR ) 100 UNIT/ML FlexPen [id number] Yes Inject 40 Units into the skin at bedtime. Alba Sharper, MD  Active Self, Pharmacy Records  levETIRAcetam  (KEPPRA ) 500 MG tablet [id number] Yes Take 1 tablet (500 mg total) by mouth 2 (two) times daily. Howell Lunger, DO  Active   levothyroxine  (SYNTHROID ) 100 MCG tablet [id number] Yes Take 1 tablet (100 mcg total) by mouth daily at 6 (six) AM. Quillen, Michael, MD  Active Self, Pharmacy Records  losartan  (COZAAR ) 25 MG tablet [id number] Yes Take 1 tablet (25 mg total) by mouth at bedtime. Howell Lunger, DO  Active Self, Pharmacy Records  Multiple Vitamins-Minerals (ALIVE MENS GUMMY MULTIVITAMINS) CHEW [id number] Yes Chew 3 tablets by mouth daily. [provider]   Active Self, Pharmacy Records  NOVOLOG  FLEXPEN 100 UNIT/ML FlexPen [id number] Yes Inject 15-20 Units into the skin 3 (three) times daily with meals. Inject 15-20 units into the skin 3 times daily with meals Howell Lunger, DO  Active Self, Pharmacy Records  potassium chloride  SA (KLOR-CON  M20) 20 MEQ tablet [id number]  Take 1 tablet (20 mEq total) by mouth daily.  Patient not taking: Reported on 11/05/2023   Maurice Sharlet RAMAN, PA-C  Active Self, Pharmacy Records  rosuvastatin  (CRESTOR ) 40 MG tablet [id number] Yes Take 1 tablet (40 mg total) by mouth daily. Maurice Sharlet RAMAN, PA-C  Active Self, Pharmacy Records  traZODone  (DESYREL ) 100 MG tablet [id number]  Take 100 mg by mouth at bedtime as needed for sleep.  Patient not taking: Reported on 11/05/2023   [provider]  Active Self, Pharmacy Records            Home Care and Equipment/Supplies: Were Home Health Services Ordered?: NA Any new equipment or medical supplies ordered?: NA  Functional Questionnaire: Do you need assistance with bathing/showering or dressing?: No Do you need assistance with meal preparation?: No Do you need assistance with eating?: No Do you have difficulty maintaining continence: No Do you need assistance with getting out of bed/getting out of a chair/moving?: No Do you have difficulty managing or taking your medications?: No  Follow up appointments reviewed: PCP Follow-up appointment confirmed?: Yes Date of PCP follow-up appointment?: 11/07/23 Follow-up Provider: [facility] Follow-up appointment confirmed?: No Reason Specialist Follow-Up Not Confirmed: Patient has Specialist Provider Number and will Call for Appointment Do you need transportation to your follow-up appointment?: No Do you understand care options if your condition(s) worsen?: Yes-patient verbalized understanding    SIGNATURE Julian Lemmings, LPN [facility] Nurse Health Advisor Direct Dial [phone]

## 2023-11-07 ENCOUNTER — Inpatient Hospital Stay: Payer: Self-pay

## 2023-12-09 ENCOUNTER — Other Ambulatory Visit: Payer: Self-pay | Admitting: Student

## 2023-12-09 DIAGNOSIS — M542 Cervicalgia: Secondary | ICD-10-CM

## 2023-12-09 DIAGNOSIS — M5412 Radiculopathy, cervical region: Secondary | ICD-10-CM

## 2023-12-18 NOTE — Progress Notes (Deleted)
 [facility] [address] [place]. [place] [address] [phone]       OFFICE FOLLOW-UP NOTE  [name] [facility] Date of Birth:  10-12-1955 Medical Record Number:  [id number]    Primary neurologist: Dr. Rosemarie Reason for visit: Stroke follow-up    HPI:   Update 12/19/2023 JM: Patient returns for 3-month follow-up visit.  He presented to ED on 8/14 with worsening of baseline left-sided weakness with forehead twitching and left eye deviation suspected to be seizure activity.  Workup negative for acute stroke.  Neurology recommended initiating Keppra  milligrams twice daily.  EEG showed cortical dysfunction from right hemisphere but no seizure activity.  Noted elevated glucose levels with A1c 10.9, advised follow-up with PCP.  Therapies initially recommended SNF however patient declined and discharged home with home health services.  Denies any recurrent seizures since that time.  Reports compliance on Keppra  without side effects.  No new stroke/TIA symptoms.  Remains on aspirin  and Crestor  without side effects.  Routinely follows with PCP for stroke risk factor management.    Consult visit 06/03/2023 Dr. Rosemarie: [name] is a pleasant 68 year old male seen today for initial office follow-up visit following hospital consultation for stroke in February 2024.  History is obtained from patient and review of electronic medical records and I personally reviewed pertinent available imaging films in PACS. He has past medical history of diabetes, hypertension, hyperlipidemia, sleep apnea, obesity, anxiety, arthritis and thyroid disease.  He presented on 05/02/2022 with sudden onset of left hemiparesis and headache as well as some chest pain overnight.  CT scan of the head on admission showed a fairly large hemorrhagic infarct in the right MCA distribution measuring 7.6 x 3.6 x 4.1 cm.  MRI scan showed hemorrhagic infarct in the right temporoparietal region similar in appearance to  the previously CT scan with cytotoxic edema and mass effect on the right lateral ventricle with 2 mm right-to-left midline shift.  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  2D echo showed ejection fraction of 50 to 55%.  Loop recorder placed prior to discharge to rehab and so far not shown paroxysmal A-fib.  LDL cholesterol 33 mg percent.  Hemoglobin A1c was 8.3.  Patient was felt to have a hemorrhagic spontaneous conversion to right MCA infarct.  Started on aspirin  alone for stroke prevention and not on dual antiplatelet due to hemorrhagic conversion.  Started on Topamax  for his headaches.  He went to inpatient rehab.  Patient states is done well he is currently living at home.  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  He is ambulating without wheeled walker.  He is able to wear his clothes and use a shower and to most activities of daily living by himself though it takes him longer.  He is able to bend his fingers in his left hand though his grip strength is still weak.  He has not been treated with any Botox.  His last hemoglobin A1c was yet elevated at 11.5 on 05/01/2023.  He has an upcoming appointment with the pharmacist to discuss medications for his diabetes.  Last repeat CT scan of the head on 04/09/2023 showed encephalomalacia in the right temporoparietal occipital region without any acute abnormality.  Last loop recorder interrogation on 04/29/2023 showed no evidence of A-fib.     ROS:   14 system review of systems is positive for weakness, difficulty  walking, stiffness, spasticity, bruising all other systems negative  PMH:  Past Medical History:  Diagnosis Date   Anxiety    Arthritis    Depression    Diabetes mellitus without complication (HCC)    Hypertensive crisis 02/22/2023   Hypothyroidism    Sleep apnea    wears CPAP   Stroke (HCC)    1 stroke 2/24, and 2  strokes on 03/09/21   Thyroid disease     Social History:  Social History   Socioeconomic History   Marital status: Married    Spouse name: [name]   Number of children: 3   Years of education: Not on file   Highest education level: Associate degree: academic program  Occupational History    Comment: disability  Tobacco Use   Smoking status: Never   Smokeless tobacco: Never  Vaping Use   Vaping status: Never Used  Substance and Sexual Activity   Alcohol use: Yes    Comment: A beer every once in a while   Drug use: No   Sexual activity: Not on file  Other Topics Concern   Not on file  Social History Narrative   Not on file   Social Drivers of Health   Financial Resource Strain: Low Risk  (09/19/2023)   Overall Financial Resource Strain (CARDIA)    Difficulty of Paying Living Expenses: Not hard at all  Food Insecurity: No Food Insecurity (11/01/2023)   Hunger Vital Sign    Worried About Running Out of Food in the Last Year: Never true    Ran Out of Food in the Last Year: Never true  Transportation Needs: Unmet Transportation Needs (11/01/2023)   PRAPARE - Administrator, Civil Service (Medical): Yes    Lack of Transportation (Non-Medical): Yes  Physical Activity: Not on file  Stress: Not on file  Social Connections: Moderately Isolated (11/01/2023)   Social Connection and Isolation Panel    Frequency of Communication with Friends and Family: More than three times a week    Frequency of Social Gatherings with Friends and Family: Never    Attends Religious Services: Never    Database Administrator or Organizations: No    Attends Banker Meetings: Never    Marital Status: Married  Catering Manager Violence: Not At Risk (11/01/2023)   Humiliation, Afraid, Rape, and Kick questionnaire    Fear of Current or Ex-Partner: No    Emotionally Abused: No    Physically Abused: No    Sexually Abused: No    Medications:   Current Outpatient Medications  on File Prior to Visit  Medication Sig Dispense Refill   acetaminophen  (TYLENOL ) 325 MG tablet Take 1-2 tablets (325-650 mg total) by mouth every 4 (four) hours as needed for mild pain (pain score 1-3).     ascorbic acid  (VITAMIN C ) 500 MG tablet Take 1 tablet (500 mg total) by mouth daily with supper.     aspirin  EC 81 MG tablet Take 1 tablet (81 mg total) by mouth daily. Swallow whole. 30 tablet 12   busPIRone  (BUSPAR ) 10 MG tablet Take 10 mg by mouth 3 (three) times daily.     carvedilol  (COREG ) 12.5 MG tablet TAKE 1 TABLET (12.5MG  TOTAL) BY MOUTH TWICE A DAY WITH MEALS 180 tablet 2   docusate sodium  (COLACE) 100 MG capsule Take 100 mg by mouth daily.     Dulaglutide  (TRULICITY ) 3 MG/0.5ML SOPN Inject 3 mg into the skin once a week. (Patient not taking: Reported  on 11/05/2023) 2 mL 0   DULoxetine  HCl 40 MG CPEP Take 40 mg by mouth daily.     furosemide  (LASIX ) 40 MG tablet Take 1 tablet (40 mg total) by mouth daily. 30 tablet 0   gabapentin  (NEURONTIN ) 300 MG capsule TAKE 1 CAPSULE BY MOUTH THREE TIMES A DAY 90 capsule 4   [Paused] guanFACINE (INTUNIV) 2 MG TB24 ER tablet Take 2 mg by mouth daily.     [Paused] hydrochlorothiazide  (HYDRODIURIL ) 12.5 MG tablet Take 1 tablet (12.5 mg total) by mouth 2 (two) times daily. (Patient taking differently: Take 12.5 mg by mouth 3 (three) times daily.) 60 tablet 0   insulin  detemir (LEVEMIR ) 100 UNIT/ML FlexPen Inject 40 Units into the skin at bedtime.     levETIRAcetam  (KEPPRA ) 500 MG tablet Take 1 tablet (500 mg total) by mouth 2 (two) times daily. 60 tablet 1   levothyroxine  (SYNTHROID ) 100 MCG tablet Take 1 tablet (100 mcg total) by mouth daily at 6 (six) AM. 30 tablet 0   losartan  (COZAAR ) 25 MG tablet Take 1 tablet (25 mg total) by mouth at bedtime. 90 tablet 3   Multiple Vitamins-Minerals (ALIVE MENS GUMMY MULTIVITAMINS) CHEW Chew 3 tablets by mouth daily.     NOVOLOG  FLEXPEN 100 UNIT/ML FlexPen Inject 15-20 Units into the skin 3 (three) times daily  with meals. Inject 15-20 units into the skin 3 times daily with meals 15 mL 1   [Paused] potassium chloride  SA (KLOR-CON  M20) 20 MEQ tablet Take 1 tablet (20 mEq total) by mouth daily. (Patient not taking: Reported on 11/05/2023) 30 tablet 0   rosuvastatin  (CRESTOR ) 40 MG tablet Take 1 tablet (40 mg total) by mouth daily. 30 tablet 0   [Paused] traZODone  (DESYREL ) 100 MG tablet Take 100 mg by mouth at bedtime as needed for sleep. (Patient not taking: Reported on 11/05/2023)     No current facility-administered medications on file prior to visit.    Allergies:  No Known Allergies  Physical Exam There were no vitals filed for this visit. There is no height or weight on file to calculate BMI.   General: well developed, well nourished pleasant middle-age male, seated, in no evident distress Head: head normocephalic and atraumatic.  Neck: supple with no carotid or supraclavicular bruits Cardiovascular: regular rate and rhythm, no murmurs Musculoskeletal: no deformity Skin:  no rash/petichiae Vascular:  Normal pulses all extremities  Neurologic Exam Mental Status: Awake and fully alert. Oriented to place and time. Recent and remote memory intact. Attention span, concentration and fund of knowledge appropriate. Mood and affect appropriate.  Cranial Nerves: Fundoscopic exam reveals sharp disc margins. Pupils equal, briskly reactive to light. Extraocular movements full without nystagmus. Visual fields show dense left homonymous hemianopsia to confrontation. Hearing intact. Facial sensation intact.  Mild left lower facial weakness.  Tongue, palate moves normally and symmetrically.  Motor: Spastic left hemiparesis with 3/5 left upper extremity strength with significant weakness of left grip and intrinsic hand muscles.  Left lower extremity strength is 4/5 with weakness of hip flexors and left foot drop and ankle dorsiflexor weakness.  Tone is increased on the left with spasticity.  Normal strength on  the right. Sensory.: intact to touch ,pinprick .position and vibratory sensation.  Coordination: Rapid alternating movements normal in all extremities. Finger-to-nose and heel-to-shin performed accurately bilaterally. Gait and Station: Arises from chair with difficulty.  Uses a wheeled walker and walks with left foot drop and leg stiffness Reflexes: 2+ and asymmetric and brisker on the left.  Toes downgoing.       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 Hx of cryptogenic right frontal infarct with small HT and SAH in 03/08/2021 and right PCA stroke 03/10/2021.  Vascular risk factors of hypertension, diabetes, hyperlipidemia.  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     PLAN:  R MCA stroke Hx of prior strokes Residual deficit left hemiparesis Continue aspirin  and Crestor  for secondary stroke prevention manner/prescribed by PCP Continue close PCP follow-up for aggressive stroke risk factor management  Post stroke seizure No recurrent seizure activity Continue Keppra  500 mg twice daily Discussed importance of medication compliance and avoidance of seizure provoking triggers Advised to call with any recurrent seizure activity   I had a long d/w patient about his remote stroke, spastic hemiparesis and left-sided peripheral vision loss, risk for recurrent stroke/TIAs, personally independently reviewed imaging studies and stroke evaluation results and answered questions.Continue aspirin  81 mg daily  for secondary stroke prevention and maintain strict control of hypertension with blood pressure goal below 130/90, diabetes with hemoglobin A1c goal below 6.5% and lipids with LDL cholesterol goal below 70 mg/dL. I also advised the patient to eat a healthy diet with plenty of whole grains, cereals, fruits and  vegetables, exercise regularly and maintain ideal body weight .check screening carotid ultrasound study.  Refer to Dr. Carilyn for evaluation for possible  Vivistim program to improve left upper extremity function.  He was advised to use his wheeled walker at all times and we discussed fall safety precautions.  Followup in the future with my nurse practitioner in 6 months or call earlier if necessary.  Greater than 50% of time during this 40 minute visit was spent on counseling,explanation of diagnosis, planning of further management, discussion with patient and family and coordination of care Eather Popp, MD Note: This document was prepared with digital dictation and possible smart phrase technology. Any transcriptional errors that result from this process are unintentional

## 2023-12-19 ENCOUNTER — Encounter: Payer: Self-pay | Admitting: Adult Health

## 2023-12-19 ENCOUNTER — Encounter: Payer: Self-pay | Admitting: Family Medicine

## 2023-12-19 ENCOUNTER — Ambulatory Visit: Admitting: Adult Health

## 2024-01-17 ENCOUNTER — Ambulatory Visit: Admitting: Student

## 2024-01-22 ENCOUNTER — Ambulatory Visit

## 2024-02-03 ENCOUNTER — Encounter: Payer: Self-pay | Admitting: Student

## 2024-02-03 ENCOUNTER — Ambulatory Visit (INDEPENDENT_AMBULATORY_CARE_PROVIDER_SITE_OTHER): Admitting: Student

## 2024-02-03 VITALS — BP 181/79 | HR 67 | Ht 72.0 in

## 2024-02-03 DIAGNOSIS — E039 Hypothyroidism, unspecified: Secondary | ICD-10-CM

## 2024-02-03 DIAGNOSIS — Z794 Long term (current) use of insulin: Secondary | ICD-10-CM

## 2024-02-03 DIAGNOSIS — I10 Essential (primary) hypertension: Secondary | ICD-10-CM

## 2024-02-03 DIAGNOSIS — I5020 Unspecified systolic (congestive) heart failure: Secondary | ICD-10-CM | POA: Diagnosis not present

## 2024-02-03 DIAGNOSIS — E119 Type 2 diabetes mellitus without complications: Secondary | ICD-10-CM

## 2024-02-03 LAB — POCT GLYCOSYLATED HEMOGLOBIN (HGB A1C): HbA1c, POC (controlled diabetic range): 10.6 % — AB (ref 0.0–7.0)

## 2024-02-03 MED ORDER — FUROSEMIDE 40 MG PO TABS
40.0000 mg | ORAL_TABLET | Freq: Every day | ORAL | 0 refills | Status: DC
Start: 2024-02-03 — End: 2024-02-03

## 2024-02-03 MED ORDER — INSULIN GLARGINE 100 UNIT/ML SOLOSTAR PEN: 10.0000 [IU] | PEN_INJECTOR | Freq: Every day | SUBCUTANEOUS | 1 refills | Status: DC

## 2024-02-03 MED ORDER — TRULICITY 0.75 MG/0.5ML ~~LOC~~ SOAJ: 0.7500 mg | SUBCUTANEOUS | 1 refills | Status: DC

## 2024-02-03 MED ORDER — FUROSEMIDE 40 MG PO TABS: 40.0000 mg | ORAL_TABLET | Freq: Every day | ORAL | 0 refills | Status: DC

## 2024-02-03 MED ORDER — CARVEDILOL 12.5 MG PO TABS: 12.5000 mg | ORAL_TABLET | Freq: Two times a day (BID) | ORAL | 2 refills | Status: AC

## 2024-02-03 MED ORDER — FUROSEMIDE 40 MG PO TABS: 40.0000 mg | ORAL_TABLET | Freq: Every day | ORAL | 1 refills | Status: DC

## 2024-02-03 MED ORDER — LOSARTAN POTASSIUM-HCTZ 50-12.5 MG PO TABS: 1.0000 | ORAL_TABLET | Freq: Every day | ORAL | 1 refills | Status: AC

## 2024-02-03 NOTE — Patient Instructions (Addendum)
 It was great to see you! Thank you for allowing me to participate in your care!   I recommend that you always bring your medications to each appointment as this makes it easy to ensure we are on the correct medications and helps us  not miss when refills are needed.  Our plans for today:  - Please take 1 tablet of losartan -hydrochlorothiazide  50-12.5 mg daily for your blood pressure - Please start taking Trulicity  0.75 mg once a week, I have sent in a 8-week supply - Please take 80 mg (2 tablets) of Lasix  for the next 3 days.  Then take 1 tablet (40 mg) daily for swelling - Please take your Synthroid  1000 mcg daily - We are checking some labs today, I will call you if they are abnormal will send you a MyChart message or a letter if they are normal.  If you do not hear about your labs in the next 2 weeks please let us  know. - Please follow-up in approximately 2 weeks before you leave for [place]  so we can verify medication and make sure you are doing well - Our pharmacy team will reach out to you about make another appointment before you leave for [place]   Take care and seek immediate care sooner if you develop any concerns. Please remember to show up 15 minutes before your scheduled appointment time!  Gladis Church, DO [facility]

## 2024-02-03 NOTE — Assessment & Plan Note (Signed)
 Increased leg edema, but no chest pain nor trouble breathing. - Refill Lasix , double up on dose for 3 days (80 mg daily for 3 days) then switch to 40 mg daily - Follow-up in 2 weeks

## 2024-02-03 NOTE — Assessment & Plan Note (Signed)
-  Poor adherence to regimen - A1c 10.4 today - Restart Trulicity  0.75 mg weekly - Lantus  10 units nightly - Referral to pharmacy with Dr. Koval

## 2024-02-03 NOTE — Progress Notes (Signed)
    SUBJECTIVE:   CHIEF COMPLAINT / HPI:   Discussed the use of AI scribe software for clinical note transcription with the patient, who gave verbal consent to proceed.  History of Present Illness Joseph Ortega is a 68 year old male with hypertension, diabetes, heart failure, hypothyroidism and chronic neck pain who presents for medication management and follow-up.  Hypertension -Prescribed losartan /hydrochlorothiazide  pills however not adherent.  Poor fill history. -Did not take blood pressure medications today - Blood pressure elevated today  Diabetes mellitus -Previous regimen included Trulicity  3 mg weekly, 40 units of Levemir  and 21-26 units of NovoLog .  However, reviewing fill history he has not refilled his medications.  Also Levemir  is off the market.  After discussion, patient admits to nonadherence.  A1c of greater than 10 today. - Missed a pharmacist appointment due to a fall  Thyroid dysfunction - Thyroid levels last checked four to five months ago were elevated - Continues Synthroid  therapy, intermittent nonadherence  Chronic neck and shoulder pain - Chronic pain worsens with weather changes - Pain affects ability to use walker - Uses Tylenol , a muscle relaxer, and lidocaine  patches for symptom control - History of ACDF to C4-5 in September 2024. - Would likely benefit from [facility], however patient is moving to [place]  in approximately 5 weeks.  Congestive heart failure, peripheral edema - Experiences leg swelling - Takes Lasix  40 mg daily, sometimes doubles the dose for increased swelling - Did not take Lasix  this morning to avoid frequent urination during the visit - Fill history consistent with adherence to Coreg  12.5 mg twice daily   OBJECTIVE:   BP (!) 181/79   Pulse 67   Ht 6' (1.829 m)   SpO2 96%   BMI 30.08 kg/m   General: NAD, pleasant Cardio: RRR, no MRG. Cap Refill <2s.  +2 pitting edema bilateral lower extremities. Respiratory: CTAB, normal wob  on RA GI: Abdomen is soft, not tender, not distended. BS present Skin: Warm and dry  ASSESSMENT/PLAN:   Assessment & Plan Type 2 diabetes mellitus without complication, with long-term current use of insulin  (HCC) -Poor adherence to regimen - A1c 10.4 today - Restart Trulicity  0.75 mg weekly - Lantus  10 units nightly - Referral to pharmacy with Dr. Koval Essential hypertension Poorly controlled, poor adherence to medications. - Transition to combo pill for simplification of regimen - Losartan -hydrochlorothiazide  50-12.5 mg daily - Follow-up in 2 weeks Hypothyroidism, unspecified type -Poor adherence to Synthroid  - TSH today Heart failure with mildly reduced ejection fraction (HFmrEF) (HCC) Increased leg edema, but no chest pain nor trouble breathing. - Refill Lasix , double up on dose for 3 days (80 mg daily for 3 days) then switch to 40 mg daily - Follow-up in 2 weeks     Gladis Church, DO [facility]

## 2024-02-03 NOTE — Assessment & Plan Note (Signed)
-  Poor adherence to Synthroid  - TSH today

## 2024-02-04 ENCOUNTER — Ambulatory Visit: Payer: Self-pay | Admitting: Student

## 2024-02-04 LAB — TSH: TSH: 11.3 u[IU]/mL — ABNORMAL HIGH (ref 0.450–4.500)

## 2024-02-04 MED ORDER — LEVOTHYROXINE SODIUM 100 MCG PO TABS: 100.0000 ug | ORAL_TABLET | Freq: Every day | ORAL | 0 refills | Status: DC

## 2024-02-04 NOTE — Telephone Encounter (Signed)
 Called patient, confirmed identity.  Discussed elevated TSH level.  Sent in Synthroid  refill.  Patient will take medication.  Recommend repeat thyroid function test in approximately 6 weeks, if patient has not left for [place]  before that time.

## 2024-02-04 NOTE — Addendum Note (Signed)
 Addended by: HOWELL LUNGER on: 02/04/2024 01:05 PM   Modules accepted: Orders

## 2024-02-17 ENCOUNTER — Telehealth: Payer: Self-pay | Admitting: Pharmacist

## 2024-02-17 NOTE — Telephone Encounter (Signed)
 Attempted to contact patient for follow-up and scheduling Rx clinic appointment.  Patient did not answer his phone.  Person answering phone stated he was not there.  When I shared that I was calling from Dr. Dudley office the person said, I will have him call you back and hung up.  I was unable to supply direct contact line for call back.   When patient contacts office - please schedule pharmacist visit with me for Diabetes and Hypertension follow-up at patient's convenience.   Total time with patient call and documentation of interaction: 4 minutes.  Follow-up phone call planned: None

## 2024-02-18 NOTE — Telephone Encounter (Signed)
 Reviewed and agree with Dr Rennis plan.

## 2024-02-21 ENCOUNTER — Other Ambulatory Visit: Payer: Self-pay

## 2024-02-21 ENCOUNTER — Inpatient Hospital Stay (HOSPITAL_COMMUNITY): Admission: EM | Admit: 2024-02-21 | Discharge: 2024-02-25 | DRG: 064 | Disposition: A

## 2024-02-21 ENCOUNTER — Inpatient Hospital Stay (HOSPITAL_COMMUNITY)

## 2024-02-21 ENCOUNTER — Emergency Department (HOSPITAL_COMMUNITY)

## 2024-02-21 ENCOUNTER — Encounter (HOSPITAL_COMMUNITY): Payer: Self-pay

## 2024-02-21 DIAGNOSIS — E039 Hypothyroidism, unspecified: Secondary | ICD-10-CM | POA: Diagnosis present

## 2024-02-21 DIAGNOSIS — I639 Cerebral infarction, unspecified: Secondary | ICD-10-CM | POA: Diagnosis present

## 2024-02-21 DIAGNOSIS — I161 Hypertensive emergency: Principal | ICD-10-CM

## 2024-02-21 DIAGNOSIS — R609 Edema, unspecified: Secondary | ICD-10-CM | POA: Diagnosis not present

## 2024-02-21 DIAGNOSIS — I509 Heart failure, unspecified: Secondary | ICD-10-CM

## 2024-02-21 DIAGNOSIS — R569 Unspecified convulsions: Secondary | ICD-10-CM

## 2024-02-21 DIAGNOSIS — I10 Essential (primary) hypertension: Secondary | ICD-10-CM | POA: Diagnosis present

## 2024-02-21 DIAGNOSIS — E119 Type 2 diabetes mellitus without complications: Secondary | ICD-10-CM

## 2024-02-21 DIAGNOSIS — G40909 Epilepsy, unspecified, not intractable, without status epilepticus: Secondary | ICD-10-CM

## 2024-02-21 DIAGNOSIS — J810 Acute pulmonary edema: Secondary | ICD-10-CM

## 2024-02-21 DIAGNOSIS — R531 Weakness: Secondary | ICD-10-CM

## 2024-02-21 DIAGNOSIS — I5020 Unspecified systolic (congestive) heart failure: Secondary | ICD-10-CM

## 2024-02-21 LAB — CBC
HCT: 40.5 % (ref 39.0–52.0)
Hemoglobin: 13.3 g/dL (ref 13.0–17.0)
MCH: 31.6 pg (ref 26.0–34.0)
MCHC: 32.8 g/dL (ref 30.0–36.0)
MCV: 96.2 fL (ref 80.0–100.0)
Platelets: 157 K/uL (ref 150–400)
RBC: 4.21 MIL/uL — ABNORMAL LOW (ref 4.22–5.81)
RDW: 14.6 % (ref 11.5–15.5)
WBC: 7.1 K/uL (ref 4.0–10.5)
nRBC: 0 % (ref 0.0–0.2)

## 2024-02-21 LAB — COMPREHENSIVE METABOLIC PANEL WITH GFR
ALT: 8 U/L (ref 0–44)
AST: 21 U/L (ref 15–41)
Albumin: 3.4 g/dL — ABNORMAL LOW (ref 3.5–5.0)
Alkaline Phosphatase: 148 U/L — ABNORMAL HIGH (ref 38–126)
Anion gap: 10 (ref 5–15)
BUN: 17 mg/dL (ref 8–23)
CO2: 26 mmol/L (ref 22–32)
Calcium: 9.1 mg/dL (ref 8.9–10.3)
Chloride: 101 mmol/L (ref 98–111)
Creatinine, Ser: 0.88 mg/dL (ref 0.61–1.24)
GFR, Estimated: >60 mL/min (ref >60)
Glucose, Bld: 423 mg/dL — ABNORMAL HIGH (ref 70–99)
Potassium: 4.1 mmol/L (ref 3.5–5.1)
Sodium: 137 mmol/L (ref 135–145)
Total Bilirubin: 0.8 mg/dL (ref 0.0–1.2)
Total Protein: 7.3 g/dL (ref 6.5–8.1)

## 2024-02-21 LAB — I-STAT CHEM 8, ED
BUN: 20 mg/dL (ref 8–23)
Calcium, Ion: 1.08 mmol/L — ABNORMAL LOW (ref 1.15–1.40)
Chloride: 101 mmol/L (ref 98–111)
Creatinine, Ser: 0.8 mg/dL (ref 0.61–1.24)
Glucose, Bld: 427 mg/dL — ABNORMAL HIGH (ref 70–99)
HCT: 41 % (ref 39.0–52.0)
Hemoglobin: 13.9 g/dL (ref 13.0–17.0)
Potassium: 4.2 mmol/L (ref 3.5–5.1)
Sodium: 140 mmol/L (ref 135–145)
TCO2: 26 mmol/L (ref 22–32)

## 2024-02-21 LAB — DIFFERENTIAL
Abs Immature Granulocytes: 0.04 K/uL (ref 0.00–0.07)
Basophils Absolute: 0 K/uL (ref 0.0–0.1)
Basophils Relative: 1 %
Eosinophils Absolute: 0.2 K/uL (ref 0.0–0.5)
Eosinophils Relative: 2 %
Immature Granulocytes: 1 %
Lymphocytes Relative: 11 %
Lymphs Abs: 0.8 K/uL (ref 0.7–4.0)
Monocytes Absolute: 0.4 K/uL (ref 0.1–1.0)
Monocytes Relative: 6 %
Neutro Abs: 5.7 K/uL (ref 1.7–7.7)
Neutrophils Relative %: 79 %

## 2024-02-21 LAB — URINALYSIS, ROUTINE W REFLEX MICROSCOPIC
Bacteria, UA: NONE SEEN
Bilirubin Urine: NEGATIVE
Glucose, UA: >=500 mg/dL — AB
Ketones, ur: NEGATIVE mg/dL
Leukocytes,Ua: NEGATIVE
Nitrite: NEGATIVE
Protein, ur: 100 mg/dL — AB
Specific Gravity, Urine: 1.013 (ref 1.005–1.030)
pH: 8 (ref 5.0–8.0)

## 2024-02-21 LAB — BASIC METABOLIC PANEL WITH GFR
Anion gap: 14 (ref 5–15)
Anion gap: 9 (ref 5–15)
BUN: 18 mg/dL (ref 8–23)
BUN: 19 mg/dL (ref 8–23)
CO2: 27 mmol/L (ref 22–32)
CO2: 30 mmol/L (ref 22–32)
Calcium: 9.2 mg/dL (ref 8.9–10.3)
Calcium: 9 mg/dL (ref 8.9–10.3)
Chloride: 100 mmol/L (ref 98–111)
Chloride: 102 mmol/L (ref 98–111)
Creatinine, Ser: 0.85 mg/dL (ref 0.61–1.24)
Creatinine, Ser: 0.84 mg/dL (ref 0.61–1.24)
GFR, Estimated: >60 mL/min (ref >60)
GFR, Estimated: >60 mL/min (ref >60)
Glucose, Bld: 442 mg/dL — ABNORMAL HIGH (ref 70–99)
Glucose, Bld: 107 mg/dL — ABNORMAL HIGH (ref 70–99)
Potassium: 4 mmol/L (ref 3.5–5.1)
Potassium: 3.4 mmol/L — ABNORMAL LOW (ref 3.5–5.1)
Sodium: 141 mmol/L (ref 135–145)
Sodium: 141 mmol/L (ref 135–145)

## 2024-02-21 LAB — MRSA NEXT GEN BY PCR, NASAL: MRSA by PCR Next Gen: DETECTED — AB

## 2024-02-21 LAB — GLUCOSE, CAPILLARY
Glucose-Capillary: 126 mg/dL — ABNORMAL HIGH (ref 70–99)
Glucose-Capillary: 147 mg/dL — ABNORMAL HIGH (ref 70–99)
Glucose-Capillary: 152 mg/dL — ABNORMAL HIGH (ref 70–99)
Glucose-Capillary: 172 mg/dL — ABNORMAL HIGH (ref 70–99)
Glucose-Capillary: 204 mg/dL — ABNORMAL HIGH (ref 70–99)
Glucose-Capillary: 242 mg/dL — ABNORMAL HIGH (ref 70–99)
Glucose-Capillary: 287 mg/dL — ABNORMAL HIGH (ref 70–99)
Glucose-Capillary: 378 mg/dL — ABNORMAL HIGH (ref 70–99)
Glucose-Capillary: 417 mg/dL — ABNORMAL HIGH (ref 70–99)
Glucose-Capillary: 425 mg/dL — ABNORMAL HIGH (ref 70–99)

## 2024-02-21 LAB — ECHOCARDIOGRAM COMPLETE
Area-P 1/2: 2.91 cm2
Height: 72 in
S' Lateral: 3.4 cm
Weight: 3865.99 [oz_av]

## 2024-02-21 LAB — ETHANOL: Alcohol, Ethyl (B): <15 mg/dL (ref <15)

## 2024-02-21 LAB — TSH: TSH: 11.215 u[IU]/mL — ABNORMAL HIGH (ref 0.350–4.500)

## 2024-02-21 LAB — T4, FREE: Free T4: 0.67 ng/dL (ref 0.61–1.12)

## 2024-02-21 LAB — PROTIME-INR
INR: 1.1 (ref 0.8–1.2)
Prothrombin Time: 15.2 s (ref 11.4–15.2)

## 2024-02-21 LAB — MAGNESIUM: Magnesium: 2 mg/dL (ref 1.7–2.4)

## 2024-02-21 LAB — BRAIN NATRIURETIC PEPTIDE: B Natriuretic Peptide: 714.1 pg/mL — ABNORMAL HIGH (ref 0.0–100.0)

## 2024-02-21 LAB — APTT: aPTT: 29 s (ref 24–36)

## 2024-02-21 MED ORDER — SODIUM CHLORIDE 0.9% FLUSH: 3.0000 mL | Freq: Once | INTRAVENOUS | Status: DC

## 2024-02-21 MED ORDER — FUROSEMIDE 10 MG/ML IJ SOLN: 60.0000 mg | Freq: Three times a day (TID) | INTRAMUSCULAR | Status: DC

## 2024-02-21 MED ORDER — HYDRALAZINE HCL 20 MG/ML IJ SOLN
10.0000 mg | Freq: Once | INTRAMUSCULAR | Status: AC
  Administered 2024-02-21: 10 mg via INTRAVENOUS
  Filled 2024-02-21: qty 1

## 2024-02-21 MED ORDER — LEVOTHYROXINE SODIUM 100 MCG PO TABS
100.0000 ug | ORAL_TABLET | Freq: Every day | ORAL | Status: DC
  Administered 2024-02-22 – 2024-02-25 (×4): 100 ug via ORAL
  Filled 2024-02-21 (×4): qty 1

## 2024-02-21 MED ORDER — LOSARTAN POTASSIUM 50 MG PO TABS
50.0000 mg | ORAL_TABLET | Freq: Every day | ORAL | Status: DC
  Administered 2024-02-21 – 2024-02-25 (×5): 50 mg via ORAL
  Filled 2024-02-21 (×5): qty 1

## 2024-02-21 MED ORDER — LEVETIRACETAM (KEPPRA) 500 MG/5 ML ADULT IV PUSH
2000.0000 mg | Freq: Once | INTRAVENOUS | Status: AC
  Administered 2024-02-21: 2000 mg via INTRAVENOUS
  Filled 2024-02-21: qty 20

## 2024-02-21 MED ORDER — ROSUVASTATIN CALCIUM 20 MG PO TABS
40.0000 mg | ORAL_TABLET | Freq: Every day | ORAL | Status: DC
  Administered 2024-02-21 – 2024-02-25 (×5): 40 mg via ORAL
  Filled 2024-02-21 (×5): qty 2

## 2024-02-21 MED ORDER — SODIUM CHLORIDE 0.9 % IV BOLUS: 1000.0000 mL | Freq: Once | INTRAVENOUS | Status: DC

## 2024-02-21 MED ORDER — POTASSIUM CHLORIDE CRYS ER 20 MEQ PO TBCR
40.0000 meq | EXTENDED_RELEASE_TABLET | Freq: Once | ORAL | Status: AC
  Administered 2024-02-21: 40 meq via ORAL
  Filled 2024-02-21: qty 2

## 2024-02-21 MED ORDER — CHLORHEXIDINE GLUCONATE CLOTH 2 % EX PADS
6.0000 | MEDICATED_PAD | Freq: Every day | CUTANEOUS | Status: DC
  Administered 2024-02-21: 6 via TOPICAL

## 2024-02-21 MED ORDER — SODIUM CHLORIDE 0.9 % IV BOLUS
1000.0000 mL | Freq: Once | INTRAVENOUS | Status: AC
  Administered 2024-02-21: 1000 mL via INTRAVENOUS

## 2024-02-21 MED ORDER — CLOPIDOGREL BISULFATE 75 MG PO TABS
300.0000 mg | ORAL_TABLET | Freq: Once | ORAL | Status: AC
  Administered 2024-02-21: 300 mg via ORAL
  Filled 2024-02-21: qty 4

## 2024-02-21 MED ORDER — CLEVIDIPINE BUTYRATE 0.5 MG/ML IV EMUL
0.0000 mg/h | INTRAVENOUS | Status: DC
  Administered 2024-02-21: 2 mg/h via INTRAVENOUS
  Filled 2024-02-21 (×2): qty 100
  Filled 2024-02-21: qty 50

## 2024-02-21 MED ORDER — LEVETIRACETAM 500 MG PO TABS
500.0000 mg | ORAL_TABLET | Freq: Two times a day (BID) | ORAL | Status: DC
  Administered 2024-02-21 – 2024-02-25 (×9): 500 mg via ORAL
  Filled 2024-02-21 (×9): qty 1

## 2024-02-21 MED ORDER — DEXAMETHASONE SOD PHOSPHATE PF 10 MG/ML IJ SOLN
10.0000 mg | Freq: Once | INTRAMUSCULAR | Status: AC
  Administered 2024-02-21: 10 mg via INTRAVENOUS

## 2024-02-21 MED ORDER — FUROSEMIDE 10 MG/ML IJ SOLN
60.0000 mg | Freq: Three times a day (TID) | INTRAMUSCULAR | Status: DC
  Administered 2024-02-21 – 2024-02-22 (×3): 60 mg via INTRAVENOUS
  Filled 2024-02-21 (×3): qty 6

## 2024-02-21 MED ORDER — DOCUSATE SODIUM 100 MG PO CAPS: 100.0000 mg | ORAL_CAPSULE | Freq: Two times a day (BID) | ORAL | Status: DC | PRN

## 2024-02-21 MED ORDER — INSULIN GLARGINE-YFGN 100 UNIT/ML ~~LOC~~ SOLN
15.0000 [IU] | Freq: Two times a day (BID) | SUBCUTANEOUS | Status: DC
  Filled 2024-02-21 (×2): qty 0.15

## 2024-02-21 MED ORDER — CLOPIDOGREL BISULFATE 75 MG PO TABS
75.0000 mg | ORAL_TABLET | Freq: Every day | ORAL | Status: DC
  Administered 2024-02-22 – 2024-02-25 (×4): 75 mg via ORAL
  Filled 2024-02-21 (×4): qty 1

## 2024-02-21 MED ORDER — FUROSEMIDE 10 MG/ML IJ SOLN
40.0000 mg | Freq: Once | INTRAMUSCULAR | Status: AC
  Administered 2024-02-21: 40 mg via INTRAVENOUS
  Filled 2024-02-21: qty 4

## 2024-02-21 MED ORDER — NITROGLYCERIN 2 % TD OINT
1.0000 [in_us] | TOPICAL_OINTMENT | Freq: Once | TRANSDERMAL | Status: AC
  Administered 2024-02-21: 1 [in_us] via TOPICAL
  Filled 2024-02-21: qty 1

## 2024-02-21 MED ORDER — POLYETHYLENE GLYCOL 3350 17 G PO PACK: 17.0000 g | PACK | Freq: Every day | ORAL | Status: DC | PRN

## 2024-02-21 MED ORDER — INSULIN REGULAR(HUMAN) IN NACL 100-0.9 UT/100ML-% IV SOLN
INTRAVENOUS | Status: DC
  Administered 2024-02-21: 19 [IU]/h via INTRAVENOUS
  Administered 2024-02-21: 17 [IU]/h via INTRAVENOUS
  Filled 2024-02-21 (×2): qty 100

## 2024-02-21 MED ORDER — PERFLUTREN LIPID MICROSPHERE
1.0000 mL | INTRAVENOUS | Status: AC | PRN
  Administered 2024-02-21: 4 mL via INTRAVENOUS
  Filled 2024-02-21: qty 10

## 2024-02-21 MED ORDER — ASPIRIN 81 MG PO TBEC
81.0000 mg | DELAYED_RELEASE_TABLET | Freq: Every day | ORAL | Status: DC
  Administered 2024-02-21 – 2024-02-25 (×5): 81 mg via ORAL
  Filled 2024-02-21 (×5): qty 1

## 2024-02-21 MED ORDER — LOSARTAN POTASSIUM-HCTZ 50-12.5 MG PO TABS: 1.0000 | ORAL_TABLET | Freq: Every day | ORAL | Status: DC

## 2024-02-21 MED ORDER — ACETAMINOPHEN 325 MG PO TABS
650.0000 mg | ORAL_TABLET | Freq: Four times a day (QID) | ORAL | Status: DC | PRN
  Administered 2024-02-21: 650 mg via ORAL

## 2024-02-21 MED ORDER — IPRATROPIUM-ALBUTEROL 0.5-2.5 (3) MG/3ML IN SOLN
3.0000 mL | Freq: Once | RESPIRATORY_TRACT | Status: AC
  Administered 2024-02-21: 3 mL via RESPIRATORY_TRACT
  Filled 2024-02-21: qty 3

## 2024-02-21 MED ORDER — INSULIN ASPART 100 UNIT/ML IJ SOLN
0.0000 [IU] | INTRAMUSCULAR | Status: DC
  Administered 2024-02-21: 15 [IU] via SUBCUTANEOUS
  Filled 2024-02-21 (×2): qty 5

## 2024-02-21 MED ORDER — INSULIN ASPART 100 UNIT/ML IJ SOLN
0.0000 [IU] | Freq: Three times a day (TID) | INTRAMUSCULAR | Status: DC
  Administered 2024-02-22 (×2): 5 [IU] via SUBCUTANEOUS
  Administered 2024-02-22: 11 [IU] via SUBCUTANEOUS
  Administered 2024-02-23 (×3): 5 [IU] via SUBCUTANEOUS
  Administered 2024-02-24 (×2): 8 [IU] via SUBCUTANEOUS
  Administered 2024-02-24: 5 [IU] via SUBCUTANEOUS
  Administered 2024-02-25: 3 [IU] via SUBCUTANEOUS
  Filled 2024-02-21: qty 5
  Filled 2024-02-21: qty 6
  Filled 2024-02-21: qty 5
  Filled 2024-02-21: qty 11
  Filled 2024-02-21: qty 5
  Filled 2024-02-21: qty 8
  Filled 2024-02-21: qty 3
  Filled 2024-02-21 (×3): qty 5

## 2024-02-21 MED ORDER — HYDROCHLOROTHIAZIDE 12.5 MG PO TABS
12.5000 mg | ORAL_TABLET | Freq: Every day | ORAL | Status: DC
  Administered 2024-02-21 – 2024-02-25 (×5): 12.5 mg via ORAL
  Filled 2024-02-21 (×5): qty 1

## 2024-02-21 MED ORDER — STROKE: EARLY STAGES OF RECOVERY BOOK
Freq: Once | Status: AC
  Filled 2024-02-21: qty 1

## 2024-02-21 MED ORDER — INSULIN GLARGINE 100 UNIT/ML ~~LOC~~ SOLN
15.0000 [IU] | Freq: Two times a day (BID) | SUBCUTANEOUS | Status: DC
  Administered 2024-02-21 – 2024-02-22 (×2): 15 [IU] via SUBCUTANEOUS
  Filled 2024-02-21 (×4): qty 0.15

## 2024-02-21 MED ORDER — LORAZEPAM 2 MG/ML IJ SOLN
INTRAMUSCULAR | Status: AC
  Administered 2024-02-21: 1 mg
  Filled 2024-02-21: qty 1

## 2024-02-21 MED ORDER — HEPARIN SODIUM (PORCINE) 5000 UNIT/ML IJ SOLN
5000.0000 [IU] | Freq: Three times a day (TID) | INTRAMUSCULAR | Status: DC
  Administered 2024-02-21 – 2024-02-25 (×14): 5000 [IU] via SUBCUTANEOUS
  Filled 2024-02-21 (×13): qty 1

## 2024-02-21 MED ORDER — INSULIN ASPART 100 UNIT/ML IJ SOLN
0.0000 [IU] | Freq: Every day | INTRAMUSCULAR | Status: DC
  Administered 2024-02-22 – 2024-02-23 (×2): 2 [IU] via SUBCUTANEOUS
  Administered 2024-02-24: 3 [IU] via SUBCUTANEOUS
  Filled 2024-02-21: qty 3
  Filled 2024-02-21: qty 2
  Filled 2024-02-21: qty 5
  Filled 2024-02-21: qty 3

## 2024-02-21 NOTE — Evaluation (Signed)
 Clinical/Bedside Swallow Evaluation Patient Details  Name: Joseph Ortega MRN: [id number] Date of Birth: March 12, 1956  Today's Date: 02/21/2024 Time: SLP Start Time (ACUTE ONLY): 1030 SLP Stop Time (ACUTE ONLY): 1040 SLP Time Calculation (min) (ACUTE ONLY): 10 min  Past Medical History:  Past Medical History:  Diagnosis Date   Anxiety    Arthritis    Depression    Diabetes mellitus without complication (HCC)    Hypertensive crisis 02/22/2023   Hypothyroidism    Sleep apnea    wears CPAP   Stroke (HCC)    1 stroke 2/24, and 2 strokes on 03/09/21   Thyroid disease    Past Surgical History:  Past Surgical History:  Procedure Laterality Date   ANTERIOR CERVICAL DECOMP/DISCECTOMY FUSION N/A 11/23/2022   Procedure: Anterior Cervical Decompression Fusion  Cervical four-five;  Surgeon: Lanis Pupa, MD;  Location: [facility] OR;  Service: Neurosurgery;  Laterality: N/A;   GROIN DEBRIDEMENT Left    ingrown hair, possibly abscesses, surgery to treat infection   HERNIA REPAIR Left    inguinal   IR ANGIO INTRA EXTRACRAN SEL COM CAROTID INNOMINATE BILAT MOD SED  03/09/2021   IR ANGIO VERTEBRAL SEL VERTEBRAL BILAT MOD SED  03/09/2021   IR US  GUIDE VASC ACCESS RIGHT  03/09/2021   LOOP RECORDER INSERTION N/A 05/07/2022   Procedure: LOOP RECORDER INSERTION;  Surgeon: Cindie Ole DASEN, MD;  Location: [facility] INVASIVE CV [facility];  Service: Cardiovascular;  Laterality: N/A;   RIGHT HEART CATH AND CORONARY ANGIOGRAPHY N/A 09/18/2023   Procedure: RIGHT HEART CATH AND CORONARY ANGIOGRAPHY;  Surgeon: Elmira Newman PARAS, MD;  Location: [facility] CV [facility];  Service: Cardiovascular;  Laterality: N/A;   HPI:  68 yo male presenting to ED 12/5 with L sided weakness and recent erratic behavior. Witnessed seizure in ED with diagnosis including hypertensive crisis vs PRES per CCM note. CTH negative. MRI pending. Most recently seen by SLP for swallowing 09/15/23 with functional-appearing performance and recommendations to  continue regular diet with thin liquids. Previously, he was followed by SLP in [place] primarily for cognition with one other swallow evaluation reporting normal function. PMH: prior CVA x3, frequent EMS calls, seizure, anxiety, arthritis, L hip pain, depression, DM, hypothyroidism, sleep apnea with CPAP, prior ACDF, GERD    Assessment / Plan / Recommendation  Clinical Impression  Today, pt denies previous concerns with swallowing though reported occasional coughing to SLP during evaluation earlier this year. Oral motor function appears WNL with some missing dentition noted. He fed himself regular solids, achieving prompt and complete oral clearance. He drank consecutive straw sips from two 8 oz cups without signs clinically concerning for aspiration. Recommend initiating a regular diet with thin liquids, adhering to aspiration and esophageal precautions and no further acute SLP f/u. SLP Visit Diagnosis: Dysphagia, unspecified (R13.10)    Aspiration Risk  Mild aspiration risk    Diet Recommendation           Other Recommendations Oral Care Recommendations: Oral care BID     Swallow Evaluation Recommendations Recommendations: PO diet PO Diet Recommendation: Regular;Thin liquids (Level 0) Liquid Administration via: Cup;Straw Medication Administration: Whole meds with liquid Supervision: Staff to assist with self-feeding;Intermittent supervision/cueing for swallowing strategies Swallowing strategies  : Minimize environmental distractions;Slow rate;Small bites/sips Postural changes: Position pt fully upright for meals;Stay upright 30-60 min after meals Oral care recommendations: Oral care BID (2x/day)   Assistance Recommended at Discharge    Functional Status Assessment Patient has not had a recent decline in their functional status  Frequency and Duration            Prognosis Prognosis for improved oropharyngeal function: Good Barriers to Reach Goals: Cognitive deficits      Swallow  Study   General HPI: 68 yo male presenting to ED 12/5 with L sided weakness and recent erratic behavior. Witnessed seizure in ED with diagnosis including hypertensive crisis vs PRES per CCM note. CTH negative. MRI pending. Most recently seen by SLP for swallowing 09/15/23 with functional-appearing performance and recommendations to continue regular diet with thin liquids. Previously, he was followed by SLP in [place] primarily for cognition with one other swallow evaluation reporting normal function. PMH: prior CVA x3, frequent EMS calls, seizure, anxiety, arthritis, L hip pain, depression, DM, hypothyroidism, sleep apnea with CPAP, prior ACDF, GERD Type of Study: Bedside Swallow Evaluation Previous Swallow Assessment: see HPI Diet Prior to this Study: NPO Temperature Spikes Noted: No Respiratory Status: Nasal cannula History of Recent Intubation: No Behavior/Cognition: Alert;Cooperative Oral Cavity Assessment: Within Functional Limits Oral Care Completed by SLP: No Oral Cavity - Dentition: Missing dentition Vision: Functional for self-feeding Self-Feeding Abilities: Able to feed self Patient Positioning: Upright in bed Baseline Vocal Quality: Normal Volitional Cough: Strong Volitional Swallow: Able to elicit    Oral/Motor/Sensory Function Overall Oral Motor/Sensory Function: Within functional limits   Ice Chips Ice chips: Not tested   Thin Liquid Thin Liquid: Within functional limits Presentation: Straw;Self Fed    Nectar Thick Nectar Thick Liquid: Not tested   Honey Thick Honey Thick Liquid: Not tested   Puree Puree: Not tested   Solid     Solid: Within functional limits Presentation: Self Fed      [name], M.A., CCC-SLP Speech Language Pathology, Acute Rehabilitation Services  Secure Chat preferred [phone]  02/21/2024,11:07 AM

## 2024-02-21 NOTE — ED Provider Notes (Signed)
 [place] EMERGENCY DEPARTMENT AT [facility] Provider Note   CSN: [id number] Arrival date & time: 02/21/24  0320  An emergency department physician performed an initial assessment on this suspected stroke patient at 0321.  Patient presents with: Code Stroke   Joseph Ortega is a 68 y.o. male.   HPI     This is a 68 year old male with a history of CVA who presented initially as a code stroke.  Per EMS was called out for left-sided weakness.  Patient has a known prior right MCA stroke that left him fairly weak on the left side.  He normally walks with a walker.  Was unable to get up around 1 AM at his normal.  However upon repeat questioning, it appears left upper extremity is at baseline.  Mostly feels like his left lower extremity is more weak.  EMS noted concentrated foul-smelling urine.  Patient also reportedly had an O2 sat of 88% on room air.  Improved with O2.  Patient taken emergently to the scanner with neurology.  Per neurology, do not feel that this is an acute stroke.  CT shows evidence of prior right MCA stroke.  No further recommended workup.  On my repeat evaluation, patient is slightly tachypneic.  He does state that he feels somewhat short of breath now.  He has not had any recent fevers but has has a cough.  History of diabetes, hypertension, seizure.  Prior to Admission medications   Medication Sig Start Date End Date Taking? Authorizing Provider  acetaminophen  (TYLENOL ) 325 MG tablet Take 1-2 tablets (325-650 mg total) by mouth every 4 (four) hours as needed for mild pain (pain score 1-3). 03/11/23   Love, Sharlet RAMAN, PA-C  ascorbic acid  (VITAMIN C ) 500 MG tablet Take 1 tablet (500 mg total) by mouth daily with supper. 03/11/23   Love, Sharlet RAMAN, PA-C  aspirin  EC 81 MG tablet Take 1 tablet (81 mg total) by mouth daily. Swallow whole. 06/03/23   Rosemarie Eather RAMAN, MD  busPIRone  (BUSPAR ) 10 MG tablet Take 10 mg by mouth 3 (three) times daily. 02/06/23   [provider]  carvedilol  (COREG ) 12.5 MG tablet Take 1 tablet (12.5 mg total) by mouth 2 (two) times daily with a meal. 02/03/24   Howell Lunger, DO  docusate sodium  (COLACE) 100 MG capsule Take 100 mg by mouth daily.    [provider]  Dulaglutide  (TRULICITY ) 0.75 MG/0.5ML SOAJ Inject 0.75 mg into the skin once a week. 02/03/24   Howell Lunger, DO  DULoxetine  HCl 40 MG CPEP Take 40 mg by mouth daily.    [provider]  furosemide  (LASIX ) 40 MG tablet Take 1 tablet (40 mg total) by mouth daily. 02/03/24 03/04/24  Howell Lunger, DO  gabapentin  (NEURONTIN ) 300 MG capsule TAKE 1 CAPSULE BY MOUTH THREE TIMES A DAY 12/09/23   Howell Lunger, DO  guanFACINE (INTUNIV) 2 MG TB24 ER tablet Take 2 mg by mouth daily. 06/07/23   [provider]  insulin  glargine (LANTUS ) 100 UNIT/ML Solostar Pen Inject 10 Units into the skin at bedtime. 02/03/24   Howell Lunger, DO  levETIRAcetam  (KEPPRA ) 500 MG tablet Take 1 tablet (500 mg total) by mouth 2 (two) times daily. 11/02/23   Howell Lunger, DO  levothyroxine  (SYNTHROID ) 100 MCG tablet Take 1 tablet (100 mcg total) by mouth daily at 6 (six) AM. 02/04/24 03/05/24  Howell Lunger, DO  losartan -hydrochlorothiazide  (HYZAAR) 50-12.5 MG tablet Take 1 tablet by mouth daily. 02/03/24   Howell,  Gladis, DO  Multiple Vitamins-Minerals (ALIVE MENS GUMMY MULTIVITAMINS) CHEW Chew 3 tablets by mouth daily.    [provider]  NOVOLOG  FLEXPEN 100 UNIT/ML FlexPen Inject 15-20 Units into the skin 3 (three) times daily with meals. Inject 15-20 units into the skin 3 times daily with meals 09/30/23   Howell Gladis, DO  potassium chloride  SA (KLOR-CON  M20) 20 MEQ tablet Take 1 tablet (20 mEq total) by mouth daily. Patient not taking: Reported on 11/05/2023 03/11/23   Love, Sharlet RAMAN, PA-C  rosuvastatin  (CRESTOR ) 40 MG tablet Take 1 tablet (40 mg total) by mouth daily. 03/11/23   Love, Sharlet RAMAN, PA-C  traZODone  (DESYREL ) 100 MG tablet Take  100 mg by mouth at bedtime as needed for sleep. Patient not taking: Reported on 11/05/2023 09/02/23   [provider]    Allergies: Patient has no known allergies.    Review of Systems  Constitutional:  Negative for fever.  Respiratory:  Positive for cough and shortness of breath.   Cardiovascular:  Negative for chest pain.  Neurological:  Positive for weakness.  All other systems reviewed and are negative.   Updated Vital Signs BP (!) 202/99   Pulse 78   Temp 98.4 F (36.9 C) (Oral)   Resp 20   Wt 115 kg   SpO2 96%   BMI 34.38 kg/m   Physical Exam Vitals and nursing note reviewed.  Constitutional:      Appearance: He is well-developed.     Comments: Chronically ill-appearing, disheveled, no acute distress  HENT:     Head: Normocephalic and atraumatic.     Nose: Nose normal.  Eyes:     Pupils: Pupils are equal, round, and reactive to light.  Cardiovascular:     Rate and Rhythm: Normal rate and regular rhythm.     Heart sounds: Normal heart sounds. No murmur heard. Pulmonary:     Effort: Respiratory distress present.     Breath sounds: Normal breath sounds. No wheezing.     Comments: Increased respiratory rate, tachypnea, poor air movement with slight expiratory wheeze noted Abdominal:     Palpations: Abdomen is soft.     Tenderness: There is no abdominal tenderness. There is no rebound.  Musculoskeletal:     Cervical back: Neck supple.     Right lower leg: Edema present.     Left lower leg: Edema present.  Lymphadenopathy:     Cervical: No cervical adenopathy.  Skin:    General: Skin is warm and dry.  Neurological:     Mental Status: He is alert and oriented to person, place, and time.     Comments: Patient can shoulder shrug on the left, minimal grip strength  Psychiatric:        Mood and Affect: Mood normal.     (all labs ordered are listed, but only abnormal results are displayed) Labs Reviewed  CBC - Abnormal; Notable for the following  components:      Result Value   RBC 4.21 (*)    All other components within normal limits  COMPREHENSIVE METABOLIC PANEL WITH GFR - Abnormal; Notable for the following components:   Glucose, Bld 423 (*)    Albumin 3.4 (*)    Alkaline Phosphatase 148 (*)    All other components within normal limits  I-STAT CHEM 8, ED - Abnormal; Notable for the following components:   Glucose, Bld 427 (*)    Calcium , Ion 1.08 (*)    All other components within normal limits  PROTIME-INR  APTT  DIFFERENTIAL  ETHANOL  URINALYSIS, ROUTINE W REFLEX MICROSCOPIC  LEVETIRACETAM  LEVEL  BRAIN NATRIURETIC PEPTIDE  CBG MONITORING, ED    EKG: None  Radiology: DG Chest Portable 1 View Result Date: 02/21/2024 EXAM: 1 VIEW(S) XRAY OF THE CHEST 02/21/2024 04:34:00 AM COMPARISON: 09/12/2023 CLINICAL HISTORY: SOB FINDINGS: LINES, TUBES AND DEVICES: Loop recorder device over left chest. LUNGS AND PLEURA: Mild pulmonary edema. Diffuse prominent interstitial opacities. Elevated right hemidiaphragm. No pleural effusion. No pneumothorax. HEART AND MEDIASTINUM: Borderline cardiomegaly. Aortic atherosclerosis. BONES AND SOFT TISSUES: Cervical fusion hardware noted. No acute osseous abnormality. IMPRESSION: 1. Mild pulmonary edema with diffuse interstitial opacities, compatible with congestive changes. 2. Borderline cardiomegaly. 3. Elevated right hemidiaphragm. Electronically signed by: Waddell Calk MD 02/21/2024 05:06 AM EST RP Workstation: [id number]   CT HEAD CODE STROKE WO CONTRAST Result Date: 02/21/2024 EXAM: CT HEAD WITHOUT 02/21/2024 03:35:31 AM TECHNIQUE: CT of the head was performed without the administration of intravenous contrast. Automated exposure control, iterative reconstruction, and/or weight based adjustment of the mA/kV was utilized to reduce the radiation dose to as low as reasonably achievable. COMPARISON: 10/31/2023 CLINICAL HISTORY: Neuro deficit, acute, stroke suspected. FINDINGS: BRAIN AND VENTRICLES:  No acute intracranial hemorrhage. No mass effect or midline shift. No extra-axial fluid collection. No evidence of acute infarct. No hydrocephalus. Old infarcts of the posterior right MCA territory and the right PCA territory. Chronic ischemic white matter change. [place] Stroke Program Early CT Score (ASPECTS): Ganglionic (caudate, ic, lentiform nucleus, insula, M1-m3): 7. Supraganglionic (m4-m6): 3. Total: 10. ORBITS: No acute abnormality. SINUSES AND MASTOIDS: No acute abnormality. SOFT TISSUES AND SKULL: No acute skull fracture. No acute soft tissue abnormality. IMPRESSION: 1. No acute intracranial abnormality. ASPECTS is 10. 2. Old infarcts in the posterior right MCA territory and the right PCA territory. Findings communicated to Dr. Camellia Shark at 3:40 AM on 02/21/2024. Electronically signed by: Franky Stanford MD 02/21/2024 03:41 AM EST RP Workstation: [id number]     .Critical Care  Performed by: Bari Charmaine FALCON, MD Authorized by: Bari Charmaine FALCON, MD   Critical care provider statement:    Critical care time (minutes):  50   Critical care was necessary to treat or prevent imminent or life-threatening deterioration of the following conditions:  Respiratory failure (hypertensive emergency)   Critical care was time spent personally by me on the following activities:  Development of treatment plan with patient or surrogate, discussions with consultants, evaluation of patient's response to treatment, examination of patient, ordering and review of laboratory studies, ordering and review of radiographic studies, ordering and performing treatments and interventions, pulse oximetry, re-evaluation of patient's condition and review of old charts    Medications Ordered in the ED  sodium chloride  flush (NS) 0.9 % injection 3 mL (3 mLs Intravenous Not Given 02/21/24 0434)  furosemide  (LASIX ) injection 40 mg (has no administration in time range)  clevidipine  (CLEVIPREX ) infusion 0.5 mg/mL (has no  administration in time range)  levETIRAcetam  (KEPPRA ) undiluted injection 2,000 mg (has no administration in time range)  ipratropium-albuterol  (DUONEB) 0.5-2.5 (3) MG/3ML nebulizer solution 3 mL (3 mLs Nebulization Given 02/21/24 0501)  dexamethasone  (DECADRON ) injection 10 mg (10 mg Intravenous Given 02/21/24 0443)  hydrALAZINE  (APRESOLINE ) injection 10 mg (10 mg Intravenous Given 02/21/24 0444)  nitroGLYCERIN  (NITROGLYN) 2 % ointment 1 inch (1 inch Topical Given 02/21/24 0501)  sodium chloride  0.9 % bolus 1,000 mL (1,000 mLs Intravenous New Bag/Given 02/21/24 0457)  LORazepam  (ATIVAN ) 2 MG/ML injection (1 mg  Given 02/21/24 0524)  Clinical Course as of 02/21/24 0532  Fri Feb 21, 2024  0525 Emergently to the bedside.  Patient noted to be seizing.  History of seizures.  Appear to be partial with left facial twitching and left arm.  Ordered Ativan .  Seizure did abort without any intervention.  He was given 1 dose of Ativan .  Updated Dr. Lindzen who recommends 2 g of Keppra .  Patient is notably hypertensive.  No real change with IV hydralazine  or Nitropaste.  I have high suspicion for hypertensive emergency given pulmonary edema and seizures.  Press is also a consideration.  MRI is ordered.  Per Dr. Lindzen, would recommend aggressive blood pressure control.  IV cleviprex  ordered. [CH]  0530 Patient noted to drop O2 sat after seizure.  Patient on nonrebreather.  Postictal at this time. [CH]    Clinical Course User Index [CH] Jessah Danser, Charmaine FALCON, MD                                 Medical Decision Making Amount and/or Complexity of Data Reviewed Labs: ordered. Radiology: ordered.  Risk Prescription drug management. Decision regarding hospitalization.   This patient presents to the ED for concern of code stroke, this involves an extensive number of treatment options, and is a complaint that carries with it a high risk of complications and morbidity.  I considered the following differential and  admission for this acute, potentially life threatening condition.  The differential diagnosis includes stroke, seizure, metabolic derangement, hypertensive urgency, hypertensive emergency  MDM:    This is a 68 year old male who presents with concerns for code stroke.  After evaluation, deficits are not much different than his baseline.  There is some consideration as to whether or not he may have had a seizure at home.  Initial stroke workup negative.  Will obtain MRI per neurology but doubt acute stroke.  Patient is notably hypertensive.  He was wheezing on my assessment with mild tachypnea.  He is on 2 L of oxygen.  Unclear whether this is his baseline.  He was initially given a DuoNeb and Decadron .  Chest x-ray however shows some pulmonary edema.  He is on Lasix  at home.  Patient was given nitro ointment and hydralazine .  40 mg of IV Lasix  ordered.  See clinical course above.  Patient seized while in emergency room.  Got a dose of Ativan .  Per neurology was also loaded with Keppra .  Concern for hypertensive urgency or emergency given combination of pulmonary edema and seizure with strokelike symptoms.  Neurology recommends aggressive blood pressure control.  Will obtain an MRI to rule out PRESS.  IV Cleviprex  ordered.  Patient did require nonrebreather postseizure for hypoxia.  Will reassess but hope to be able to wean this.  Discussed with critical care who will evaluate for ICU admission.  (Labs, imaging, consults)  Labs: I Ordered, and personally interpreted labs.  The pertinent results include: Stroke labs, BNP  Imaging Studies ordered: I ordered imaging studies including CT head, chest x-ray, MRI pending I independently visualized and interpreted imaging. I agree with the radiologist interpretation  Additional history obtained from chart review.  External records from outside source obtained and reviewed including prior admission  Cardiac Monitoring: The patient was maintained on a cardiac  monitor.  If on the cardiac monitor, I personally viewed and interpreted the cardiac monitored which showed an underlying rhythm of: Sinus  Reevaluation: After the interventions noted above, I reevaluated  the patient and found that they have :stayed the same  Social Determinants of Health:  lives independently  Disposition: Admit  Co morbidities that complicate the patient evaluation  Past Medical History:  Diagnosis Date   Anxiety    Arthritis    Depression    Diabetes mellitus without complication (HCC)    Hypertensive crisis 02/22/2023   Hypothyroidism    Sleep apnea    wears CPAP   Stroke (HCC)    1 stroke 2/24, and 2 strokes on 03/09/21   Thyroid disease      Medicines Meds ordered this encounter  Medications   sodium chloride  flush (NS) 0.9 % injection 3 mL   ipratropium-albuterol  (DUONEB) 0.5-2.5 (3) MG/3ML nebulizer solution 3 mL   dexamethasone  (DECADRON ) injection 10 mg   DISCONTD: sodium chloride  0.9 % bolus 1,000 mL   hydrALAZINE  (APRESOLINE ) injection 10 mg   nitroGLYCERIN  (NITROGLYN) 2 % ointment 1 inch   sodium chloride  0.9 % bolus 1,000 mL   LORazepam  (ATIVAN ) 2 MG/ML injection    Erick Sor M: cabinet override   furosemide  (LASIX ) injection 40 mg   clevidipine  (CLEVIPREX ) infusion 0.5 mg/mL   levETIRAcetam  (KEPPRA ) undiluted injection 2,000 mg    I have reviewed the patients home medicines and have made adjustments as needed  Problem List / ED Course: Problem List Items Addressed This Visit       Other   Seizure (HCC)   Relevant Medications   levETIRAcetam  (KEPPRA ) undiluted injection 2,000 mg   Other Visit Diagnoses       Hypertensive emergency    -  Primary   Relevant Medications   hydrALAZINE  (APRESOLINE ) injection 10 mg (Completed)   nitroGLYCERIN  (NITROGLYN) 2 % ointment 1 inch (Completed)   furosemide  (LASIX ) injection 40 mg   clevidipine  (CLEVIPREX ) infusion 0.5 mg/mL     Weakness         Acute pulmonary edema (HCC)                     Final diagnoses:  Weakness  Seizure (HCC)  Hypertensive emergency  Acute pulmonary edema [facility])    ED Discharge Orders     None          Bari Charmaine FALCON, MD 02/21/24 [phone]

## 2024-02-21 NOTE — Progress Notes (Signed)
 PT Cancellation Note  Patient Details Name: Oluwatimilehin Balfour MRN: [id number] DOB: 1956/01/04   Cancelled Treatment:    Reason Eval/Treat Not Completed: Patient at procedure or test/unavailable.  Pt gone to MRI on arrival.  Will try back and see pt as able. 02/21/2024  [place] HERO., PT Acute Rehabilitation Services [phone]  (office)   Vinie GAILS Kaleb Linquist 02/21/2024, 2:20 PM

## 2024-02-21 NOTE — Progress Notes (Signed)
 Patient isn't available for EEG at the moment will be going to MRI soon.  Will try back as schedule allows.

## 2024-02-21 NOTE — Progress Notes (Signed)
 NEUROLOGY CONSULT FOLLOW UP NOTE   Date of service: February 21, 2024 Patient Name: Joseph Ortega MRN:  [id number] DOB:  1955/05/04  Interval Hx/subjective  Patient has remained hemodynamically stable and afebrile overnight.  He has had no further seizures since about 5:00 this morning.  He remains in the ICU on an insulin  infusion. Vitals   Vitals:   02/21/24 1120 02/21/24 1125 02/21/24 1130 02/21/24 1153  BP: 123/66 (!) 147/74    Pulse: 67 67 67   Resp: (!) 22 (!) 21    Temp:    98.4 F (36.9 C)  TempSrc:    Axillary  SpO2: 92% 97% 96%   Weight:      Height:         Body mass index is 32.77 kg/m.  Physical Exam   Constitutional: Appears well-developed and well-nourished.  Psych: Affect appropriate to situation.  Eyes: No scleral injection.  HENT: No OP obstrucion.  Head: Normocephalic.  Cardiovascular: Normal rate and regular rhythm.  Respiratory: Effort normal, non-labored breathing.  Skin: WDI.   Neurologic Examination    NEURO:  Mental Status: AA&Ox3 but responses sometimes slow, able to do simple addition but not simple subtraction Speech/Language: speech is without dysarthria or aphasia.    Cranial Nerves:  II: PERRL. Visual fields full.  III, IV, VI: EOMI. Eyelids elevate symmetrically.  V: Sensation is intact to light touch and symmetrical to face.  VII: Smile is symmetrical.  VIII: hearing intact to voice. IX, X: Phonation is normal.  XII: tongue is midline without fasciculations. Motor: 5/5 strength to bilateral upper extremities and lower extremity, 4/5 strength to left lower extremity right Tone: is normal and bulk is normal Sensation- Intact to light touch bilaterally.  Coordination: FTN intact bilaterally Gait- deferred   Medications  Current Facility-Administered Medications:    aspirin  EC tablet 81 mg, 81 mg, Oral, Daily, Tobie, Amar, DO   Chlorhexidine  Gluconate Cloth 2 % PADS 6 each, 6 each, Topical, Daily, Claudene Toribio BROCKS, MD, 6 each  at 02/21/24 1002   clevidipine  (CLEVIPREX ) infusion 0.5 mg/mL, 0-21 mg/hr, Intravenous, Continuous, Kassie Acquanetta Bradley, MD, Stopped at 02/21/24 1125   docusate sodium  (COLACE) capsule 100 mg, 100 mg, Oral, BID PRN, Claudene Toribio BROCKS, MD   furosemide  (LASIX ) injection 60 mg, 60 mg, Intravenous, Q8H, Claudene Toribio BROCKS, MD   heparin  injection 5,000 Units, 5,000 Units, Subcutaneous, Q8H, Claudene Toribio BROCKS, MD, 5,000 Units at 02/21/24 1002   hydrochlorothiazide  (HYDRODIURIL ) tablet 12.5 mg, 12.5 mg, Oral, Daily, Kassie Acquanetta Bradley, MD   insulin  regular, human (MYXREDLIN ) 100 units/ 100 mL infusion, , Intravenous, Continuous, Tobie Gaines, DO, Stopped at 02/21/24 1128   levETIRAcetam  (KEPPRA ) tablet 500 mg, 500 mg, Oral, BID, Tobie, Amar, DO, 500 mg at 02/21/24 1005   [START ON 02/22/2024] levothyroxine  (SYNTHROID ) tablet 100 mcg, 100 mcg, Oral, Q0600, Tobie Gaines, DO   losartan  (COZAAR ) tablet 50 mg, 50 mg, Oral, Daily, Kassie Acquanetta Bradley, MD   polyethylene glycol (MIRALAX  / GLYCOLAX ) packet 17 g, 17 g, Oral, Daily PRN, Claudene Toribio BROCKS, MD   rosuvastatin  (CRESTOR ) tablet 40 mg, 40 mg, Oral, Daily, Patel, Amar, DO, 40 mg at 02/21/24 1002   sodium chloride  flush (NS) 0.9 % injection 3 mL, 3 mL, Intravenous, Once, Horton, Charmaine FALCON, MD  Labs and Diagnostic Imaging   CBC:  Recent Labs  Lab 02/21/24 0320 02/21/24 0323  WBC 7.1  --   NEUTROABS 5.7  --   HGB 13.3 13.9  HCT 40.5  41.0  MCV 96.2  --   PLT 157  --     Basic Metabolic Panel:  Lab Results  Component Value Date   NA 140 02/21/2024   K 4.2 02/21/2024   CO2 26 02/21/2024   GLUCOSE 427 (H) 02/21/2024   BUN 20 02/21/2024   CREATININE 0.80 02/21/2024   CALCIUM  9.1 02/21/2024   GFRNONAA >60 02/21/2024   GFRAA >60 10/14/2019   Lipid Panel:  Lab Results  Component Value Date   LDLCALC 88 11/01/2023   HgbA1c:  Lab Results  Component Value Date   HGBA1C 10.6 (A) 02/03/2024   Urine Drug Screen:     Component Value Date/Time   LABOPIA  NONE DETECTED 10/31/2023 2341   COCAINSCRNUR NONE DETECTED 10/31/2023 2341   COCAINSCRNUR NONE DETECTED 03/08/2021 1003   LABBENZ POSITIVE (A) 10/31/2023 2341   AMPHETMU NONE DETECTED 10/31/2023 2341   THCU NONE DETECTED 10/31/2023 2341   LABBARB NONE DETECTED 10/31/2023 2341    Alcohol Level     Component Value Date/Time   ETH <15 02/21/2024 0320   INR  Lab Results  Component Value Date   INR 1.1 02/21/2024   APTT  Lab Results  Component Value Date   APTT 29 02/21/2024    CT Head without contrast(Personally reviewed): No acute abnormality  CT angio Head and Neck with contrast(Personally reviewed): Pending  MRI Brain(Personally reviewed): Small subacute infarct in posterior left centrum semiovale  rEEG:  Pending  Assessment   Allan Minotti is a 68 y.o. male with history of 3 strokes, seizures, anxiety, arthritis, left hip pain, depression, diabetes, hypertension, hypothyroidism, sleep apnea on CPAP and prior ACDF who presents with worsening weakness of his left lower extremity.  He reports that he was walking around his house and got stuck between his walker in his bed and called EMS to assist him.  He states he was unable to ambulate effectively with the weakness in his left lower extremity.  Patient's wife reported some new onset erratic behavior as well.  He had some new onset facial twitching while en route to the ED.  While in the emergency department, he became unresponsive and had a seizure.  Given hypertension on arrival, there was some concern for PRES, but this is not demonstrated on MRI.  MRI does demonstrate small subacute infarct in posterior left centrum semiovale, which also does not explain his left lower extremity weakness.  Weakness was likely simply caused by exhaustion of the left lower extremity caused by multiple attempts to free it from getting stuck between the walker and the bed.  Patient will require stroke workup and risk factor modification.  Will  await results of routine EEG and continue patient on home Keppra .  Recommendations   -Likely outside of permissive hypertension window, maintain normotension - CTA head and neck - TTE  - Check A1c and LDL + add statin per guidelines - Aspirin  81 mg daily and Plavix  75 mg daily, load with 300 mg Plavix  antiplt/anticoag - q4 hr neuro checks - STAT head CT for any change in neuro exam - Tele - PT/OT/SLP - Stroke education - Amb referral to neurology upon discharge   -Continue Keppra  500 mg twice daily -Seizure precautions, will discuss driving restrictions with patient ______________________________________________________________________ Patient seen by NP with MD, MD to edit note as needed.  Signed, Cortney E Everitt Clint Kill, NP [facility]   NEUROHOSPITALIST ADDENDUM Performed a face to face diagnostic evaluation.   I have reviewed the contents  of history and physical exam as documented by PA/ARNP/Resident and agree with above documentation.  I have discussed and formulated the above plan as documented. Edits to the note have been made as needed.  Impression/Key exam findings/Plan: reports that his left leg was physically stuck between his walker and the bed and despite attempting to get it unstuck, he just could not. He reports feeling very exhausted and left leg was weak secondary to this. He eventually called EMS.  He looks close to his baseline. The noted stroke on MRI brain is incidental and does not explain his presentation. Continue Keppra  500mg  BID.  Stroke workup for noted incidental stroke along with DAPT.  Plan discussed with Dr. Tobie and Dr. Fleeta with the PCCM team.  Summer Mccolgan, MD [facility] [phone]   If 7pm to 7am, please call on call as listed on AMION.

## 2024-02-21 NOTE — Consult Note (Addendum)
 NEUROLOGY CONSULT NOTE   Date of service: February 21, 2024 Patient Name: Joseph Ortega MRN:  [id number] DOB:  1955-07-31 Chief Complaint: Worsened LLE weakness Requesting Provider: Bari Charmaine FALCON, MD  History of Present Illness  Joseph Ortega is a 68 y.o. 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 LKN 0100. On EMS arrival he stated that his left side was weaker than normal and that he could not stand. When EMS went back to the ambulance to obtain the stretcher, patient apparently got up to urinate and then got back in bed, per wife. When EMS returned, he continued to endorse left sided weakness. His wife stated that his behavior has been more erratic for the past few weeks with possibly some abusive behavior as well. BP per EMS was 233/110, HR 75, CBG 463, 88% on RA improved with O2. En route to the ED, he had some left facial twitching around the eye that then spread to the right side of his forehead and then spontaneously resolved. Also en route, he urinated in a urinal. On arrival to the ED he states that his LLE is the only limb that is weaker than normal and that his LUE is at its baseline level of weakness. He also has chronic sensory numbness on the left which is unchanged. He states that some of the worsened weakness is due to left hip pain. 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 He was started on Keppra  in August of this year for new onset of focal seizure.  Home medications include ASA, Neurontin , Keppra  and rosuvastatin .   LKW: 0100 Modified rankin score: 3-Moderate  disability-requires help but walks WITHOUT assistance IV Thrombolysis:  No: New symptoms are too mild to treat; previous hemorrhagic transformation of stroke in 2024 EVT:  No: Presentation not consistent with LVO   NIHSS components Score: Comment  1a Level of Conscious 0[x]  1[]  2[]  3[]      1b LOC Questions 0[x]  1[]  2[]       1c LOC Commands 0[x]  1[]  2[]       2 Best Gaze 0[x]  1[]  2[]       3 Visual 0[]  1[]  2[x]  3[]     Left visual field cut (referable to old stroke seen on CT and chronic deficit per patient)  4 Facial Palsy 0[x]  1[]  2[]  3[]      5a Motor Arm - left 0[]  1[]  2[]  3[x]  4[]  UN[]    5b Motor Arm - Right 0[x]  1[]  2[]  3[]  4[]  UN[]    6a Motor Leg - Left 0[]  1[]  2[]  3[x]  4[]  UN[]    6b Motor Leg - Right 0[x]  1[]  2[]  3[]  4[]  UN[]    7 Limb Ataxia 0[x]  1[]  2[]  UN[]     Unable to test on the left  8 Sensory 0[]  1[]  2[x]  UN[]     Absent FT and decreased scratch sensation to LUE. Decreased FT and scratch sensation to LLE  9 Best Language 0[x]  1[]  2[]  3[]      10 Dysarthria 0[x]  1[]  2[]  UN[]      11 Extinct. and Inattention 0[]  1[x]  2[]      Extinction to LUE with DSS  TOTAL:  11      ROS  Has had an intermittent headache over the past 2 days. Not currently with a headache. Comprehensive ROS performed and pertinent positives are otherwise documented in HPI    Past History   Past Medical History:  Diagnosis Date   Anxiety    Arthritis    Depression    Diabetes mellitus without complication (HCC)    Hypertensive crisis 02/22/2023   Hypothyroidism    Sleep apnea    wears CPAP   Stroke (HCC)    1 stroke 2/24, and 2 strokes on 03/09/21   Thyroid disease     Past Surgical History:  Procedure Laterality Date   ANTERIOR CERVICAL DECOMP/DISCECTOMY FUSION N/A 11/23/2022   Procedure: Anterior Cervical Decompression Fusion  Cervical four-five;  Surgeon: Lanis Pupa, MD;  Location: [facility] OR;  Service: Neurosurgery;  Laterality: N/A;   GROIN DEBRIDEMENT Left    ingrown hair, possibly  abscesses, surgery to treat infection   HERNIA REPAIR Left    inguinal   IR ANGIO INTRA EXTRACRAN SEL COM CAROTID INNOMINATE BILAT MOD SED  03/09/2021   IR ANGIO VERTEBRAL SEL VERTEBRAL BILAT MOD SED  03/09/2021   IR US  GUIDE VASC ACCESS RIGHT  03/09/2021   LOOP RECORDER INSERTION N/A 05/07/2022   Procedure: LOOP RECORDER INSERTION;  Surgeon: Cindie Ole DASEN, MD;  Location: [facility] INVASIVE [facility];  Service: Cardiovascular;  Laterality: N/A;   RIGHT HEART CATH AND CORONARY ANGIOGRAPHY N/A 09/18/2023   Procedure: RIGHT HEART CATH AND CORONARY ANGIOGRAPHY;  Surgeon: Elmira Newman PARAS, MD;  Location: [facility];  Service: Cardiovascular;  Laterality: N/A;    Family History: Family History  Problem Relation Age of Onset   Diabetes Father     Social History  reports that he has never smoked. He has never used smokeless tobacco. He reports current alcohol use. He reports that he does not use drugs.  No Known Allergies  Medications   Current Facility-Administered Medications:    sodium chloride  flush (NS) 0.9 % injection 3 mL, 3 mL, Intravenous, Once, Horton, Charmaine FALCON, MD  Current Outpatient Medications:    acetaminophen  (TYLENOL ) 325 MG tablet, Take 1-2 tablets (325-650 mg total) by mouth every 4 (four) hours as needed for mild pain (pain score 1-3)., Disp: , Rfl:    ascorbic acid  (VITAMIN C ) 500 MG tablet, Take 1 tablet (500 mg total) by mouth daily with supper., Disp: , Rfl:    aspirin  EC 81 MG tablet, Take 1 tablet (81 mg total) by mouth daily. Swallow whole., Disp: 30 tablet, Rfl: 12   busPIRone  (BUSPAR ) 10 MG tablet, Take 10 mg by mouth 3 (three) times daily., Disp: , Rfl:    carvedilol  (COREG ) 12.5 MG tablet, Take 1 tablet (12.5 mg total) by mouth 2 (two) times daily with a meal., Disp: 180 tablet, Rfl: 2   docusate sodium  (COLACE) 100 MG capsule, Take 100 mg by mouth daily., Disp: , Rfl:    Dulaglutide  (TRULICITY ) 0.75 MG/0.5ML SOAJ, Inject 0.75 mg into the skin once a  week., Disp: 2 mL, Rfl: 1   DULoxetine  HCl 40 MG CPEP, Take 40 mg by mouth daily., Disp: , Rfl:    furosemide  (LASIX ) 40 MG tablet, Take 1 tablet (40 mg total) by mouth daily., Disp: 30 tablet, Rfl: 1   gabapentin  (NEURONTIN ) 300 MG capsule, TAKE 1 CAPSULE BY MOUTH THREE TIMES A DAY, Disp: 90 capsule, Rfl: 4   [Paused] guanFACINE (INTUNIV) 2 MG TB24 ER tablet,  Take 2 mg by mouth daily., Disp: , Rfl:    insulin  glargine (LANTUS ) 100 UNIT/ML Solostar Pen, Inject 10 Units into the skin at bedtime., Disp: 3 mL, Rfl: 1   levETIRAcetam  (KEPPRA ) 500 MG tablet, Take 1 tablet (500 mg total) by mouth 2 (two) times daily., Disp: 60 tablet, Rfl: 1   levothyroxine  (SYNTHROID ) 100 MCG tablet, Take 1 tablet (100 mcg total) by mouth daily at 6 (six) AM., Disp: 30 tablet, Rfl: 0   losartan -hydrochlorothiazide  (HYZAAR) 50-12.5 MG tablet, Take 1 tablet by mouth daily., Disp: 90 tablet, Rfl: 1   Multiple Vitamins-Minerals (ALIVE MENS GUMMY MULTIVITAMINS) CHEW, Chew 3 tablets by mouth daily., Disp: , Rfl:    NOVOLOG  FLEXPEN 100 UNIT/ML FlexPen, Inject 15-20 Units into the skin 3 (three) times daily with meals. Inject 15-20 units into the skin 3 times daily with meals, Disp: 15 mL, Rfl: 1   [Paused] potassium chloride  SA (KLOR-CON  M20) 20 MEQ tablet, Take 1 tablet (20 mEq total) by mouth daily. (Patient not taking: Reported on 11/05/2023), Disp: 30 tablet, Rfl: 0   rosuvastatin  (CRESTOR ) 40 MG tablet, Take 1 tablet (40 mg total) by mouth daily., Disp: 30 tablet, Rfl: 0   [Paused] traZODone  (DESYREL ) 100 MG tablet, Take 100 mg by mouth at bedtime as needed for sleep. (Patient not taking: Reported on 11/05/2023), Disp: , Rfl:   Vitals   BP (!) 211/101   Pulse 78   Temp 98.4 F (36.9 C) (Oral)   Resp 20   Wt 115 kg   SpO2 96%   BMI 34.38 kg/m    Physical Exam   Constitutional: Appears well-developed and well-nourished.  Psych: Anxious affect   Eyes: No scleral injection.  HENT: No OP obstruction.  Head:  Normocephalic.  Respiratory: Effort normal, non-labored breathing.  Skin: Edema to BLE  Neurologic Examination   See NIHSS  Labs/Imaging/Neurodiagnostic studies   CBC:  Recent Labs  Lab 02-27-2024 0320 02-27-24 0323  WBC 7.1  --   NEUTROABS 5.7  --   HGB 13.3 13.9  HCT 40.5 41.0  MCV 96.2  --   PLT 157  --    Basic Metabolic Panel:  Lab Results  Component Value Date   NA 140 27-Feb-2024   K 4.2 02/27/24   CO2 29 11/02/2023   GLUCOSE 427 (H) 02-27-2024   BUN 20 2024-02-27   CREATININE 0.80 27-Feb-2024   CALCIUM  8.8 (L) 11/02/2023   GFRNONAA >60 11/02/2023   GFRAA >60 10/14/2019   Lipid Panel:  Lab Results  Component Value Date   LDLCALC 88 11/01/2023   HgbA1c:  Lab Results  Component Value Date   HGBA1C 10.6 (A) 02/03/2024   Urine Drug Screen:     Component Value Date/Time   LABOPIA NONE DETECTED 10/31/2023 2341   COCAINSCRNUR NONE DETECTED 10/31/2023 2341   COCAINSCRNUR NONE DETECTED 03/08/2021 1003   LABBENZ POSITIVE (A) 10/31/2023 2341   AMPHETMU NONE DETECTED 10/31/2023 2341   THCU NONE DETECTED 10/31/2023 2341   LABBARB NONE DETECTED 10/31/2023 2341    Alcohol Level     Component Value Date/Time   [facility] <15 10/31/2023 2307   INR  Lab Results  Component Value Date   INR 1.1 10/31/2023   APTT  Lab Results  Component Value Date   APTT 32 10/31/2023     ASSESSMENT  Leovardo Yacoub Diltz is a 68 y.o. male with prior strokes x 3, residual left hemiparesis, DM and seizure, presenting with subjective worsening of his LLE weakness.  -  Exam as documented above. Findings are referable to the large regions of right frontal, parietal and occipital encephalomalacia seen on CT - No seizure-like activity noted on examination - CT head: No acute intracranial abnormality. ASPECTS is 10. Old infarcts in the posterior right MCA territory and the right PCA territory. - Labs: Glucose 427. BUN and Cr normal. Na and K normal. Total serum Ca normal; ionized Ca low at  1.08. LFTs normal. WBC normal. Coags normal.  - Impression:  - Subjective worsening of LLE relative to prior post-stroke baseline. DDx includes a small acute stroke, TIA, psychogenic pseudostroke, weakness due to pain, secondary gain and radiculopathy given his complaint of hip-region pain. Severe HTN may have played a role in his acute onset and now improving symptoms, as well. Recrudescence/worsening of pre-existing neurological deficits may also be due to his severe hyperglycemia and/or the severe HTN noted by EMS.  - Transient left facial twitching in the context of a history of seizures on Keppra . Hyperglycemia may be contributing.  - Not a TNK candidate as new symptoms are too mild to treat. Also with a history of hemorrhagic transformation of stroke in 2024 - Presentation is not consistent with LVO: Not a thrombectomy candidate.   RECOMMENDATIONS  - MRI brain (ordered) - Management of his hyperglycemia - BP management. Need for aggressive lowering supersedes permissive HTN due to relatively low likelihood of acute stroke.  - Continue his home ASA, Neurontin , Keppra  and rosuvastatin  - Keppra  level (ordered) - Continue prescribed home dose of Keppra  500 mg BID - Consider obtaining an EEG if his facial twitching recurs after correction of his hyperglycemia - Hip/pelvis plain films - Consider obtaining an MRI of his lumbar spine if hip/pelvis plain films are unremarkable  ______________________________________________________________________    Bonney SHARK, Falicity Sheets, MD [facility]

## 2024-02-21 NOTE — H&P (Signed)
 NAME:  Joseph Ortega, MRN:  [id number], DOB:  1955/09/09, LOS: 0 ADMISSION DATE:  02/21/2024, CONSULTATION DATE:  02/21/24 REFERRING MD:  Horton, CHIEF COMPLAINT:  weakness   History of Present Illness:  68 year old man with prior R MCA stroke w left deficits but able to ambulate p/w worsening L sided weakness.  Brought in as code stroke; given mild symptoms and ineligibility for lytics/thrombectomy no vessel imaging done; CT head stable.  Found to also be hypoxemic and hypertensive.  While in ER witnessed unresponsiveness and seizure at 5a.  Loaded with keppra  and ordered for MRI; thought is we are dealing with some form of hypertensive crisis vs. PRES.  PCCM to admit.  Patient still a bit out of it but able to say no pain, mild dyspnea, and ongoing dense L sided weakenss.  Pertinent  Medical History   Past Medical History:  Diagnosis Date   Anxiety    Arthritis    Depression    Diabetes mellitus without complication (HCC)    Hypertensive crisis 02/22/2023   Hypothyroidism    Sleep apnea    wears CPAP   Stroke (HCC)    1 stroke 2/24, and 2 strokes on 03/09/21   Thyroid disease      Significant Hospital Events: Including procedures, antibiotic start and stop dates in addition to other pertinent events   02/21/24 admit  Interim History / Subjective:  admit  Objective    Blood pressure (!) 202/99, pulse 78, temperature 98.4 F (36.9 C), temperature source Oral, resp. rate 20, weight 115 kg, SpO2 96%.       No intake or output data in the 24 hours ending 02/21/24 0609 Filed Weights   02/21/24 0300  Weight: 115 kg    Examination: General: chronically ill appearing HENT: [place] 4, trachea midline Lungs: crackles bases, mildly labored breathing pattern Cardiovascular: regular, ext warm Abdomen: soft, +BS Extremities: marked edema on top of lymphedema Neuro: answers simple yes/no questions, moves R to command, not moving left Skin: lichenification noted  Severe  hyperglycemia TSH is up a few weeks ago A1c of 10 noted CXR wet  Resolved problem list   Assessment and Plan  Acute on chronic neuro deficits, baseline L paresis, witnessed seizure, hypertension- working diagnosis is hypertensive crisis vs. PRES, seems to be more lucid than would be expected for PRES so suspect the former is the issue.  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  Acute hypoxemic respiratory failure- pulmonary edema pattern on CXR and looks volume up; could be flash edema vs. aHFrEF (baseline ef 40%)  DM2 with severe hyperglycemia  OSA  Hypothyroidism- think he may need to take more synthroid , TSH always up  PT/OT/SLP consults Cleviprex  goal normotension SBP < 140 F/u MRI brain Ordered EEG spot, vEEG at neuro discretion AED per neuro Recheck TSH, would go up on synthroid  if has not recently Not sure why got a dose of dexamethasone , will check with RN Push diuresis, PM BMP ordered Wean O2 for sats > 90%; if recurrent seizure likely needs ETT given everything going on and need for MRI  Labs   CBC: Recent Labs  Lab 02/21/24 0320 02/21/24 0323  WBC 7.1  --   NEUTROABS 5.7  --   HGB 13.3 13.9  HCT 40.5 41.0  MCV 96.2  --   PLT 157  --     Basic Metabolic Panel: Recent Labs  Lab 02/21/24 0320 02/21/24 0323  NA  137 140  K 4.1 4.2  CL 101 101  CO2 26  --   GLUCOSE 423* 427*  BUN 17 20  CREATININE 0.88 0.80  CALCIUM  9.1  --    GFR: Estimated Creatinine Clearance: 119 mL/min (by C-G formula based on SCr of 0.8 mg/dL). Recent Labs  Lab 02/21/24 0320  WBC 7.1    Liver Function Tests: Recent Labs  Lab 02/21/24 0320  AST 21  ALT 8  ALKPHOS 148*  BILITOT 0.8  PROT 7.3  ALBUMIN 3.4*   No results for input(s): LIPASE, AMYLASE in the last 168 hours. No results for input(s): AMMONIA in the last 168 hours.  ABG    Component Value Date/Time   HCO3 27.8 10/31/2023 2357   TCO2 26 02/21/2024 0323    O2SAT 96 10/31/2023 2357     Coagulation Profile: Recent Labs  Lab 02/21/24 0320  INR 1.1    Cardiac Enzymes: No results for input(s): CKTOTAL, CKMB, CKMBINDEX, TROPONINI in the last 168 hours.  HbA1C: HbA1c, POC (controlled diabetic range)  Date/Time Value Ref Range Status  02/03/2024 01:40 PM 10.6 (A) 0.0 - 7.0 % Final  05/01/2023 09:38 AM 11.5 (A) 0.0 - 7.0 % Final   Hgb A1c MFr Bld  Date/Time Value Ref Range Status  11/01/2023 05:44 PM 10.9 (H) 4.8 - 5.6 % Final    Comment:    (NOTE) Diagnosis of Diabetes The following HbA1c ranges recommended by the American Diabetes Association (ADA) may be used as an aid in the diagnosis of diabetes mellitus.  Hemoglobin             Suggested A1C NGSP%              Diagnosis  <5.7                   Non Diabetic  5.7-6.4                Pre-Diabetic  >6.4                   Diabetic  <7.0                   Glycemic control for                       adults with diabetes.    09/13/2023 04:43 AM 11.8 (H) 4.8 - 5.6 % Final    Comment:    (NOTE) Diagnosis of Diabetes The following HbA1c ranges recommended by the American Diabetes Association (ADA) may be used as an aid in the diagnosis of diabetes mellitus.  Hemoglobin             Suggested A1C NGSP%              Diagnosis  <5.7                   Non Diabetic  5.7-6.4                Pre-Diabetic  >6.4                   Diabetic  <7.0                   Glycemic control for                       adults with diabetes.      CBG: No results  for input(s): GLUCAP in the last 168 hours.  Review of Systems:   Limited due to postictal state  Past Medical History:  He,  has a past medical history of Anxiety, Arthritis, Depression, Diabetes mellitus without complication (HCC), Hypertensive crisis (02/22/2023), Hypothyroidism, Sleep apnea, Stroke (HCC), and Thyroid disease.   Surgical History:   Past Surgical History:  Procedure Laterality Date   ANTERIOR  CERVICAL DECOMP/DISCECTOMY FUSION N/A 11/23/2022   Procedure: Anterior Cervical Decompression Fusion  Cervical four-five;  Surgeon: Lanis Pupa, MD;  Location: [facility];  Service: Neurosurgery;  Laterality: N/A;   GROIN DEBRIDEMENT Left    ingrown hair, possibly abscesses, surgery to treat infection   HERNIA REPAIR Left    inguinal   IR ANGIO INTRA EXTRACRAN SEL COM CAROTID INNOMINATE BILAT MOD SED  03/09/2021   IR ANGIO VERTEBRAL SEL VERTEBRAL BILAT MOD SED  03/09/2021   IR US  GUIDE VASC ACCESS RIGHT  03/09/2021   LOOP RECORDER INSERTION N/A 05/07/2022   Procedure: LOOP RECORDER INSERTION;  Surgeon: Cindie Ole DASEN, MD;  Location: [facility];  Service: Cardiovascular;  Laterality: N/A;   RIGHT HEART CATH AND CORONARY ANGIOGRAPHY N/A 09/18/2023   Procedure: RIGHT HEART CATH AND CORONARY ANGIOGRAPHY;  Surgeon: Elmira Newman PARAS, MD;  Location: [facility];  Service: Cardiovascular;  Laterality: N/A;     Social History:   reports that he has never smoked. He has never used smokeless tobacco. He reports current alcohol use. He reports that he does not use drugs.   Family History:  His family history includes Diabetes in his father.   Allergies No Known Allergies   Home Medications  Prior to Admission medications   Medication Sig Start Date End Date Taking? Authorizing Provider  acetaminophen  (TYLENOL ) 325 MG tablet Take 1-2 tablets (325-650 mg total) by mouth every 4 (four) hours as needed for mild pain (pain score 1-3). 03/11/23   Love, Sharlet RAMAN, PA-C  ascorbic acid  (VITAMIN C ) 500 MG tablet Take 1 tablet (500 mg total) by mouth daily with supper. 03/11/23   Love, Sharlet RAMAN, PA-C  aspirin  EC 81 MG tablet Take 1 tablet (81 mg total) by mouth daily. Swallow whole. 06/03/23   Rosemarie Eather RAMAN, MD  busPIRone  (BUSPAR ) 10 MG tablet Take 10 mg by mouth 3 (three) times daily. 02/06/23   [provider]  carvedilol  (COREG ) 12.5 MG tablet Take 1 tablet (12.5 mg total) by  mouth 2 (two) times daily with a meal. 02/03/24   Howell Lunger, DO  docusate sodium  (COLACE) 100 MG capsule Take 100 mg by mouth daily.    [provider]  Dulaglutide  (TRULICITY ) 0.75 MG/0.5ML SOAJ Inject 0.75 mg into the skin once a week. 02/03/24   Howell Lunger, DO  DULoxetine  HCl 40 MG CPEP Take 40 mg by mouth daily.    [provider]  furosemide  (LASIX ) 40 MG tablet Take 1 tablet (40 mg total) by mouth daily. 02/03/24 03/04/24  Howell Lunger, DO  gabapentin  (NEURONTIN ) 300 MG capsule TAKE 1 CAPSULE BY MOUTH THREE TIMES A DAY 12/09/23   Howell Lunger, DO  guanFACINE (INTUNIV) 2 MG TB24 ER tablet Take 2 mg by mouth daily. 06/07/23   [provider]  insulin  glargine (LANTUS ) 100 UNIT/ML Solostar Pen Inject 10 Units into the skin at bedtime. 02/03/24   Howell Lunger, DO  levETIRAcetam  (KEPPRA ) 500 MG tablet Take 1 tablet (500 mg total) by mouth 2 (two) times daily. 11/02/23   Howell Lunger, DO  levothyroxine  (  SYNTHROID ) 100 MCG tablet Take 1 tablet (100 mcg total) by mouth daily at 6 (six) AM. 02/04/24 03/05/24  Howell Lunger, DO  losartan -hydrochlorothiazide  (HYZAAR) 50-12.5 MG tablet Take 1 tablet by mouth daily. 02/03/24   Howell Lunger, DO  Multiple Vitamins-Minerals (ALIVE MENS GUMMY MULTIVITAMINS) CHEW Chew 3 tablets by mouth daily.    [provider]  NOVOLOG  FLEXPEN 100 UNIT/ML FlexPen Inject 15-20 Units into the skin 3 (three) times daily with meals. Inject 15-20 units into the skin 3 times daily with meals 09/30/23   Howell Lunger, DO  potassium chloride  SA (KLOR-CON  M20) 20 MEQ tablet Take 1 tablet (20 mEq total) by mouth daily. Patient not taking: Reported on 11/05/2023 03/11/23   Maurice Sharlet RAMAN, PA-C  rosuvastatin  (CRESTOR ) 40 MG tablet Take 1 tablet (40 mg total) by mouth daily. 03/11/23   Love, Sharlet RAMAN, PA-C  traZODone  (DESYREL ) 100 MG tablet Take 100 mg by mouth at bedtime as needed for sleep. Patient not taking: Reported on  11/05/2023 09/02/23   [provider]     Critical care time: 34 mins

## 2024-02-21 NOTE — Progress Notes (Signed)
 NAME:  Joseph Ortega, MRN:  [id number], DOB:  Mar 01, 1956, LOS: 0 ADMISSION DATE:  02/21/2024, CONSULTATION DATE:  02/21/24 REFERRING MD:  Horton, CHIEF COMPLAINT:  weakness   History of Present Illness:  68 year old man with prior R MCA stroke w left deficits but able to ambulate p/w worsening L sided weakness.  Brought in as code stroke; given mild symptoms and ineligibility for lytics/thrombectomy no vessel imaging done; CT head stable.  Found to also be hypoxemic and hypertensive.  While in ER witnessed unresponsiveness and seizure at 5a.  Loaded with keppra  and ordered for MRI; thought is we are dealing with some form of hypertensive crisis vs. PRES.  PCCM to admit.  Patient still a bit out of it but able to say no pain, mild dyspnea, and ongoing dense L sided weakenss.  Pertinent  Medical History   Past Medical History:  Diagnosis Date   Anxiety    Arthritis    Depression    Diabetes mellitus without complication (HCC)    Hypertensive crisis 02/22/2023   Hypothyroidism    Sleep apnea    wears CPAP   Stroke (HCC)    1 stroke 2/24, and 2 strokes on 03/09/21   Thyroid disease      Significant Hospital Events: Including procedures, antibiotic start and stop dates in addition to other pertinent events   02/21/24 admit to ICU  Interim History / Subjective:  Overnight: Patient admitted to ICU after having a seizure in emergency department  Patient evaluated bedside this morning.  He is able to talk to me and has no concerns this morning.  He does admit that he had missed his medications in the last few days.  He states he forgets to take them.  Objective    Blood pressure 127/69, pulse 75, temperature 98.4 F (36.9 C), temperature source Oral, resp. rate 15, weight 115 kg, SpO2 92%.       No intake or output data in the 24 hours ending 02/21/24 0731 Filed Weights   02/21/24 0300  Weight: 115 kg    Examination: General: Chronically ill-appearing, not in any acute  distress HENT: Normocephalic, atraumatic Lungs: Patient does have bibasilar crackles, nonlabored breathing Cardiovascular: Regular rate and rhythm, no murmurs, rubs, gallops Abdomen: Soft, nontender, normoactive bowel sounds Extremities: 1+-2+ pitting edema to bilateral lower extremities Neuro: Alert and oriented x 3, able to follow all instructions, 1/5 strength noted to left upper and lower extremity (baseline), 5/5 strength noted to right upper and lower extremity.  Cranial nerves II through XII intact.  Severe hyperglycemia TSH is up a few weeks ago A1c of 10 noted CXR wet  Resolved problem list   Assessment and Plan   This is a 68 year old male with past medical history of prior CVA with residual left hemiparesis, diabetes, seizures (on Keppra ), who presented to the emergency department with concerns of altered mental status and ultimately had a seizure.  Patient also found to be severely hypertensive.  #Seizure Patient found to be with a seizure-like activity in the emergency department.  He was loaded with Keppra  2000 mg.  Seizure likely in the setting of nonadherence to medications.  Patient is to be on Keppra  500 mg twice daily outpatient.  He endorses nonadherence.  He states he just forgets to take his medicines. - Seizure precautions - Follow-up MRI - Restart home Keppra  500 mg twice daily - Neurology following, appreciate recommendation - Follow-up EEG  #Severe symptomatic hypertension Patient found to have severe  symptomatic hypertension.  This have also caused a seizure.  This could have also caused his neurodeficits.  He is already improving on Cleviprex  drip.  Goal blood pressure at this time is systolics of 140. - Resume home losartan -HCTZ 50-12.5 mg daily - Holding home Coreg  in the setting of potential heart failure exacerbation - Wean off Cleviprex  as tolerated with goal blood pressure of systolics less than 140 - Monitor renal function  #Acute hypoxemic  respiratory failure #HFrEF exacerbation Patient currently requiring 6 L nasal cannula to maintain oxygen saturations of 97%.  Patient is volume overloaded.  Likely exacerbation in the setting of severe symptomatic hypertension.  Patient had cardiac MRI on September 17, 2023 showing left ejection fraction of 44%. -Lasix  60 mg every 8 hours started - Daily ins and outs - Daily weights - Hold home beta-blocker in the setting of heart failure exacerbation - Follow-up DVT studies  #Prior CVA with left residual hemiparesis Patient with past medical history of prior CVA.  Patient does have residual left-sided hemiparesis.  No further focal neurological deficits appreciated on my exam this morning.  Lipid panel in 3 months ago showed total cholesterol 152, LDL 88, triglycerides 59.  LDL not at goal likely in the setting of nonadherence. - Resume home aspirin  81 mg daily - Resume home Crestor  40 mg daily - Follow-up MRI  #Type 2 diabetes A1c 2 weeks ago 10.6.  Not well-controlled.  Home medications include Trulicity  0.75 mg weekly, Levemir  20-40 mg daily, NovoLog  15-20 mg 3 times daily.  Blood glucose 423. -Patient is n.p.o. at this time, will need swallow screen to resume diet - Every 4 sugar checks - Start Lantus  15 units BID - Moderate sliding scale  #Hypothyroidism TSH 11.215.  Will obtain free T4. -Resume home levothyroxine  100 mcg daily  Labs   CBC: Recent Labs  Lab 02/21/24 0320 02/21/24 0323  WBC 7.1  --   NEUTROABS 5.7  --   HGB 13.3 13.9  HCT 40.5 41.0  MCV 96.2  --   PLT 157  --     Basic Metabolic Panel: Recent Labs  Lab 02/21/24 0320 02/21/24 0323  NA 137 140  K 4.1 4.2  CL 101 101  CO2 26  --   GLUCOSE 423* 427*  BUN 17 20  CREATININE 0.88 0.80  CALCIUM  9.1  --    GFR: Estimated Creatinine Clearance: 119 mL/min (by C-G formula based on SCr of 0.8 mg/dL). Recent Labs  Lab 02/21/24 0320  WBC 7.1    Liver Function Tests: Recent Labs  Lab 02/21/24 0320   AST 21  ALT 8  ALKPHOS 148*  BILITOT 0.8  PROT 7.3  ALBUMIN 3.4*   No results for input(s): LIPASE, AMYLASE in the last 168 hours. No results for input(s): AMMONIA in the last 168 hours.  ABG    Component Value Date/Time   HCO3 27.8 10/31/2023 2357   TCO2 26 02/21/2024 0323   O2SAT 96 10/31/2023 2357     Coagulation Profile: Recent Labs  Lab 02/21/24 0320  INR 1.1    Cardiac Enzymes: No results for input(s): CKTOTAL, CKMB, CKMBINDEX, TROPONINI in the last 168 hours.  HbA1C: HbA1c, POC (controlled diabetic range)  Date/Time Value Ref Range Status  02/03/2024 01:40 PM 10.6 (A) 0.0 - 7.0 % Final  05/01/2023 09:38 AM 11.5 (A) 0.0 - 7.0 % Final   Hgb A1c MFr Bld  Date/Time Value Ref Range Status  11/01/2023 05:44 PM 10.9 (H) 4.8 - 5.6 %  Final    Comment:    (NOTE) Diagnosis of Diabetes The following HbA1c ranges recommended by the American Diabetes Association (ADA) may be used as an aid in the diagnosis of diabetes mellitus.  Hemoglobin             Suggested A1C NGSP%              Diagnosis  <5.7                   Non Diabetic  5.7-6.4                Pre-Diabetic  >6.4                   Diabetic  <7.0                   Glycemic control for                       adults with diabetes.    09/13/2023 04:43 AM 11.8 (H) 4.8 - 5.6 % Final    Comment:    (NOTE) Diagnosis of Diabetes The following HbA1c ranges recommended by the American Diabetes Association (ADA) may be used as an aid in the diagnosis of diabetes mellitus.  Hemoglobin             Suggested A1C NGSP%              Diagnosis  <5.7                   Non Diabetic  5.7-6.4                Pre-Diabetic  >6.4                   Diabetic  <7.0                   Glycemic control for                       adults with diabetes.      CBG: No results for input(s): GLUCAP in the last 168 hours.  Review of Systems:   Limited due to postictal state  Past Medical History:  He,   has a past medical history of Anxiety, Arthritis, Depression, Diabetes mellitus without complication (HCC), Hypertensive crisis (02/22/2023), Hypothyroidism, Sleep apnea, Stroke (HCC), and Thyroid disease.   Surgical History:   Past Surgical History:  Procedure Laterality Date   ANTERIOR CERVICAL DECOMP/DISCECTOMY FUSION N/A 11/23/2022   Procedure: Anterior Cervical Decompression Fusion  Cervical four-five;  Surgeon: Lanis Pupa, MD;  Location: [facility];  Service: Neurosurgery;  Laterality: N/A;   GROIN DEBRIDEMENT Left    ingrown hair, possibly abscesses, surgery to treat infection   HERNIA REPAIR Left    inguinal   IR ANGIO INTRA EXTRACRAN SEL COM CAROTID INNOMINATE BILAT MOD SED  03/09/2021   IR ANGIO VERTEBRAL SEL VERTEBRAL BILAT MOD SED  03/09/2021   IR US  GUIDE VASC ACCESS RIGHT  03/09/2021   LOOP RECORDER INSERTION N/A 05/07/2022   Procedure: LOOP RECORDER INSERTION;  Surgeon: Cindie Ole DASEN, MD;  Location: [facility];  Service: Cardiovascular;  Laterality: N/A;   RIGHT HEART CATH AND CORONARY ANGIOGRAPHY N/A 09/18/2023   Procedure: RIGHT HEART CATH AND CORONARY ANGIOGRAPHY;  Surgeon: Elmira Newman PARAS, MD;  Location: [facility];  Service: Cardiovascular;  Laterality: N/A;  Social History:   reports that he has never smoked. He has never used smokeless tobacco. He reports current alcohol use. He reports that he does not use drugs.   Family History:  His family history includes Diabetes in his father.   Allergies No Known Allergies   Home Medications  Prior to Admission medications   Medication Sig Start Date End Date Taking? Authorizing Provider  acetaminophen  (TYLENOL ) 325 MG tablet Take 1-2 tablets (325-650 mg total) by mouth every 4 (four) hours as needed for mild pain (pain score 1-3). 03/11/23   Love, Sharlet RAMAN, PA-C  ascorbic acid  (VITAMIN C ) 500 MG tablet Take 1 tablet (500 mg total) by mouth daily with supper. 03/11/23   Love, Sharlet RAMAN, PA-C   aspirin  EC 81 MG tablet Take 1 tablet (81 mg total) by mouth daily. Swallow whole. 06/03/23   Sethi, Pramod S, MD  busPIRone  (BUSPAR ) 10 MG tablet Take 10 mg by mouth 3 (three) times daily. 02/06/23   [provider]  carvedilol  (COREG ) 12.5 MG tablet Take 1 tablet (12.5 mg total) by mouth 2 (two) times daily with a meal. 02/03/24   Howell Lunger, DO  docusate sodium  (COLACE) 100 MG capsule Take 100 mg by mouth daily.    [provider]  Dulaglutide  (TRULICITY ) 0.75 MG/0.5ML SOAJ Inject 0.75 mg into the skin once a week. 02/03/24   Howell Lunger, DO  DULoxetine  HCl 40 MG CPEP Take 40 mg by mouth daily.    [provider]  furosemide  (LASIX ) 40 MG tablet Take 1 tablet (40 mg total) by mouth daily. 02/03/24 03/04/24  Howell Lunger, DO  gabapentin  (NEURONTIN ) 300 MG capsule TAKE 1 CAPSULE BY MOUTH THREE TIMES A DAY 12/09/23   Howell Lunger, DO  guanFACINE (INTUNIV) 2 MG TB24 ER tablet Take 2 mg by mouth daily. 06/07/23   [provider]  insulin  glargine (LANTUS ) 100 UNIT/ML Solostar Pen Inject 10 Units into the skin at bedtime. 02/03/24   Howell Lunger, DO  levETIRAcetam  (KEPPRA ) 500 MG tablet Take 1 tablet (500 mg total) by mouth 2 (two) times daily. 11/02/23   Howell Lunger, DO  levothyroxine  (SYNTHROID ) 100 MCG tablet Take 1 tablet (100 mcg total) by mouth daily at 6 (six) AM. 02/04/24 03/05/24  Howell Lunger, DO  losartan -hydrochlorothiazide  (HYZAAR) 50-12.5 MG tablet Take 1 tablet by mouth daily. 02/03/24   Howell Lunger, DO  Multiple Vitamins-Minerals (ALIVE MENS GUMMY MULTIVITAMINS) CHEW Chew 3 tablets by mouth daily.    [provider]  NOVOLOG  FLEXPEN 100 UNIT/ML FlexPen Inject 15-20 Units into the skin 3 (three) times daily with meals. Inject 15-20 units into the skin 3 times daily with meals 09/30/23   Howell Lunger, DO  potassium chloride  SA (KLOR-CON  M20) 20 MEQ tablet Take 1 tablet (20 mEq total) by mouth daily. Patient not  taking: Reported on 11/05/2023 03/11/23   Love, Sharlet RAMAN, PA-C  rosuvastatin  (CRESTOR ) 40 MG tablet Take 1 tablet (40 mg total) by mouth daily. 03/11/23   Love, Sharlet RAMAN, PA-C  traZODone  (DESYREL ) 100 MG tablet Take 100 mg by mouth at bedtime as needed for sleep. Patient not taking: Reported on 11/05/2023 09/02/23   [provider]     Critical care time:       Libby Blanch, DO  Internal Medicine Resident PGY-3

## 2024-02-21 NOTE — Progress Notes (Signed)
 Bilateral lower extremity venous duplex has been completed. Preliminary results can be found in CV Proc through chart review.   02/21/24 10:22 AM Cathlyn Collet RVT

## 2024-02-21 NOTE — ED Triage Notes (Signed)
 [facility] from home after pt reported he cannot walk at 0200. lsw 0100 (after he let his dogs out.) Prior large Right MCA stroke in 2024 with residual left sided weakness. Pt states he is able to ambulate with a walker.

## 2024-02-21 NOTE — ED Notes (Signed)
 Pt heard making noises. RN found pt seizing and unresponsive to painful stimuli. EDP notified and at the bedside.

## 2024-02-21 NOTE — Progress Notes (Addendum)
 CCM notified Pt CBG high, see new orders.   1300- CCM updated on endotool and insulin  drip. Also notified of Pt  urine output. Pt on lasix , urine clear with yellow.    1430- Notified CCM CBG and endotool reccs, stated they will leave Pt on Insulin  overnight.   1900- CCM notified of CBG, K lab result and clamping insulin  line. See new orders.

## 2024-02-21 NOTE — Procedures (Signed)
 Patient Name: Joseph Ortega  MRN: [id number]  Epilepsy Attending: Arlin MALVA Krebs  Referring Physician/Provider: Claudene Toribio BROCKS, MD Date: 02/21/2024 Duration:   Patient history: 68 year old man with prior R MCA stroke w left deficits but able to ambulate p/w worsening L sided weakness. EEG to evaluate for seizure  Level of alertness: Awake, asleep  AEDs during EEG study: LEV, Ativan   Technical aspects: This EEG study was done with scalp electrodes positioned according to the 10-20 International system of electrode placement. Electrical activity was reviewed with band pass filter of 1-70Hz , sensitivity of 7 uV/mm, display speed of 16mm/sec with a 60Hz  notched filter applied as appropriate. EEG data were recorded continuously and digitally stored.  Video monitoring was available and reviewed as appropriate.  Description: The posterior dominant rhythm consists of 8 Hz activity of moderate voltage (25-35 uV) seen predominantly in posterior head regions, symmetric and reactive to eye opening and eye closing. Sleep was characterized by vertex waves, sleep spindles (12 to 14 Hz), maximal frontocentral region. EEG showed continuous 5 to 7 Hz theta slowing in right hemisphere. Hyperventilation and photic stimulation were not performed.      ABNORMALITY - Continuous slow, right hemisphere   IMPRESSION: This study is suggestive of cortical dysfunction arising from right hemisphere likely secondary to underlying structural abnormality/encephalomalacia. No seizures or epileptiform discharges were seen throughout the recording.   Tashima Scarpulla O Shiquan Mathieu

## 2024-02-21 NOTE — Progress Notes (Signed)
 STAT EEG complete - results pending. ? ?

## 2024-02-21 NOTE — Code Documentation (Signed)
 Stroke Response Nurse Documentation Code Documentation  Joseph Ortega is a 68 y.o. 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 On No antithrombotic. Code stroke was activated by EMS.   Patient from home where he was LKW at 0100 and now complaining of weakness and cannot walk .   Stroke team at the bedside on patient arrival. Labs drawn and patient cleared for CT by Dr. Bari. Patient to CT with team. NIHSS 11, see documentation for details and code stroke times. Patient with left arm weakness, left leg weakness, left decreased sensation, and left neglect on exam. The following imaging was completed:  CT Head. Patient is not a candidate for IV Thrombolytic due to stroke with hemorrhagic conversion in 2024. Patient is not a candidate for IR due to low suspicion of new LVO.   Care Plan: Neuro checks q2 hrs.    Bedside handoff with ED RN Tiffany.    Griselda Alm ORN  Rapid Response RN

## 2024-02-21 NOTE — Progress Notes (Signed)
 Echocardiogram 2D Echocardiogram has been performed.  Joseph Ortega 02/21/2024, 4:32 PM

## 2024-02-22 ENCOUNTER — Inpatient Hospital Stay (HOSPITAL_COMMUNITY)

## 2024-02-22 LAB — LIPID PANEL
Cholesterol: 172 mg/dL (ref 0–200)
HDL: 63 mg/dL (ref >40)
LDL Cholesterol: 101 mg/dL — ABNORMAL HIGH (ref 0–99)
Total CHOL/HDL Ratio: 2.7 ratio
Triglycerides: 40 mg/dL (ref <150)
VLDL: 8 mg/dL (ref 0–40)

## 2024-02-22 LAB — GLUCOSE, CAPILLARY
Glucose-Capillary: 85 mg/dL (ref 70–99)
Glucose-Capillary: 235 mg/dL — ABNORMAL HIGH (ref 70–99)
Glucose-Capillary: 316 mg/dL — ABNORMAL HIGH (ref 70–99)
Glucose-Capillary: 245 mg/dL — ABNORMAL HIGH (ref 70–99)
Glucose-Capillary: 216 mg/dL — ABNORMAL HIGH (ref 70–99)
Glucose-Capillary: 241 mg/dL — ABNORMAL HIGH (ref 70–99)

## 2024-02-22 LAB — BASIC METABOLIC PANEL WITH GFR
Anion gap: 9 (ref 5–15)
BUN: 23 mg/dL (ref 8–23)
CO2: 30 mmol/L (ref 22–32)
Calcium: 8.5 mg/dL — ABNORMAL LOW (ref 8.9–10.3)
Chloride: 97 mmol/L — ABNORMAL LOW (ref 98–111)
Creatinine, Ser: 1.06 mg/dL (ref 0.61–1.24)
GFR, Estimated: >60 mL/min (ref >60)
Glucose, Bld: 230 mg/dL — ABNORMAL HIGH (ref 70–99)
Potassium: 3.6 mmol/L (ref 3.5–5.1)
Sodium: 136 mmol/L (ref 135–145)

## 2024-02-22 LAB — RAPID URINE DRUG SCREEN, HOSP PERFORMED
Amphetamines: NOT DETECTED
Barbiturates: NOT DETECTED
Benzodiazepines: NOT DETECTED
Cocaine: NOT DETECTED
Opiates: NOT DETECTED
Tetrahydrocannabinol: NOT DETECTED

## 2024-02-22 LAB — CBC
HCT: 37.7 % — ABNORMAL LOW (ref 39.0–52.0)
Hemoglobin: 13 g/dL (ref 13.0–17.0)
MCH: 31.5 pg (ref 26.0–34.0)
MCHC: 34.5 g/dL (ref 30.0–36.0)
MCV: 91.3 fL (ref 80.0–100.0)
Platelets: 190 K/uL (ref 150–400)
RBC: 4.13 MIL/uL — ABNORMAL LOW (ref 4.22–5.81)
RDW: 14.7 % (ref 11.5–15.5)
WBC: 10.6 K/uL — ABNORMAL HIGH (ref 4.0–10.5)
nRBC: 0 % (ref 0.0–0.2)

## 2024-02-22 LAB — PHOSPHORUS: Phosphorus: 3.5 mg/dL (ref 2.5–4.6)

## 2024-02-22 LAB — MAGNESIUM: Magnesium: 1.9 mg/dL (ref 1.7–2.4)

## 2024-02-22 LAB — TRIGLYCERIDES: Triglycerides: 39 mg/dL (ref <150)

## 2024-02-22 MED ORDER — IOHEXOL 350 MG/ML SOLN
75.0000 mL | Freq: Once | INTRAVENOUS | Status: AC | PRN
  Administered 2024-02-22: 75 mL via INTRAVENOUS

## 2024-02-22 MED ORDER — FUROSEMIDE 40 MG PO TABS: 40.0000 mg | ORAL_TABLET | Freq: Every day | ORAL | Status: DC

## 2024-02-22 MED ORDER — POTASSIUM CHLORIDE CRYS ER 20 MEQ PO TBCR
40.0000 meq | EXTENDED_RELEASE_TABLET | Freq: Once | ORAL | Status: AC
  Administered 2024-02-22: 40 meq via ORAL
  Filled 2024-02-22: qty 2

## 2024-02-22 MED ORDER — EZETIMIBE 10 MG PO TABS
10.0000 mg | ORAL_TABLET | Freq: Every day | ORAL | Status: DC
  Administered 2024-02-22 – 2024-02-25 (×4): 10 mg via ORAL
  Filled 2024-02-22 (×4): qty 1

## 2024-02-22 MED ORDER — ORAL CARE MOUTH RINSE: 15.0000 mL | OROMUCOSAL | Status: DC | PRN

## 2024-02-22 MED ORDER — MUPIROCIN 2 % EX OINT
1.0000 | TOPICAL_OINTMENT | Freq: Two times a day (BID) | CUTANEOUS | Status: DC
  Administered 2024-02-22 – 2024-02-25 (×7): 1 via NASAL
  Filled 2024-02-22 (×3): qty 22

## 2024-02-22 MED ORDER — CHLORHEXIDINE GLUCONATE CLOTH 2 % EX PADS
6.0000 | MEDICATED_PAD | Freq: Every day | CUTANEOUS | Status: DC
  Administered 2024-02-22 – 2024-02-25 (×4): 6 via TOPICAL

## 2024-02-22 MED ORDER — FUROSEMIDE 10 MG/ML IJ SOLN
60.0000 mg | Freq: Two times a day (BID) | INTRAMUSCULAR | Status: DC
  Administered 2024-02-22 – 2024-02-24 (×4): 60 mg via INTRAVENOUS
  Filled 2024-02-22: qty 6
  Filled 2024-02-22: qty 8
  Filled 2024-02-22 (×2): qty 6

## 2024-02-22 MED ORDER — INSULIN GLARGINE 100 UNIT/ML ~~LOC~~ SOLN
20.0000 [IU] | Freq: Two times a day (BID) | SUBCUTANEOUS | Status: DC
  Administered 2024-02-22 – 2024-02-24 (×3): 20 [IU] via SUBCUTANEOUS
  Filled 2024-02-22 (×7): qty 0.2

## 2024-02-22 MED ORDER — MAGNESIUM SULFATE 2 GM/50ML IV SOLN
2.0000 g | Freq: Once | INTRAVENOUS | Status: AC
  Administered 2024-02-22: 2 g via INTRAVENOUS
  Filled 2024-02-22: qty 50

## 2024-02-22 NOTE — Evaluation (Signed)
 Speech Language Pathology Evaluation Patient Details Name: Joseph Ortega MRN: [id number] DOB: 04-01-1955 Today's Date: 02/22/2024 Time: 0912-0928 SLP Time Calculation (min) (ACUTE ONLY): 16 min  Problem List:  Patient Active Problem List   Diagnosis Date Noted   Seizure disorder (HCC) 11/01/2023   Seizure (HCC) 11/01/2023   Seizure-like activity (HCC) 11/01/2023   Coronary artery disease involving native coronary artery of native heart 09/19/2023   Congestive heart failure (HCC) 09/19/2023   Hypertrophic cardiomyopathy (HCC) 09/18/2023   AKI (acute kidney injury) 09/17/2023   Acute systolic heart failure (HCC) 09/16/2023   Abnormal echocardiogram 09/16/2023   Bilateral edema of lower extremity 09/13/2023   Chronic health problem 09/13/2023   Heart failure with mildly reduced ejection fraction (HFmrEF) (HCC) 05/31/2023   Tinea pedis 03/11/2023   History of peripheral edema 03/11/2023   Current mild episode of major depressive disorder 03/04/2023   TBI (traumatic brain injury) (HCC) 02/26/2023   Cervical stenosis of spine 08/15/2022   Hyperlipidemia 05/07/2022   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   Stroke (cerebrum) (HCC) 05/02/2022   ICH (intracerebral hemorrhage) (HCC) 03/17/2021   Hypothyroidism 03/12/2021   OSA (obstructive sleep apnea) 03/12/2021   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   Type 2 diabetes mellitus (HCC) 03/12/2021   Morbid obesity (HCC) 03/11/2021   Hypertension 03/11/2021   Tobacco abuse 03/11/2021   Herpes zoster with complication    SAH (subarachnoid hemorrhage) (HCC) 03/08/2021   Past Medical History:  Past Medical History:  Diagnosis Date   Anxiety    Arthritis    Depression    Diabetes mellitus without complication (HCC)    Hypertensive crisis 02/22/2023   Hypothyroidism    Sleep apnea    wears CPAP   Stroke (HCC)    1 stroke 2/24, and 2 strokes on 03/09/21   Thyroid disease    Past Surgical History:  Past Surgical History:   Procedure Laterality Date   ANTERIOR CERVICAL DECOMP/DISCECTOMY FUSION N/A 11/23/2022   Procedure: Anterior Cervical Decompression Fusion  Cervical four-five;  Surgeon: Lanis Pupa, MD;  Location: [facility] OR;  Service: Neurosurgery;  Laterality: N/A;   GROIN DEBRIDEMENT Left    ingrown hair, possibly abscesses, surgery to treat infection   HERNIA REPAIR Left    inguinal   IR ANGIO INTRA EXTRACRAN SEL COM CAROTID INNOMINATE BILAT MOD SED  03/09/2021   IR ANGIO VERTEBRAL SEL VERTEBRAL BILAT MOD SED  03/09/2021   IR US  GUIDE VASC ACCESS RIGHT  03/09/2021   LOOP RECORDER INSERTION N/A 05/07/2022   Procedure: LOOP RECORDER INSERTION;  Surgeon: Cindie Ole DASEN, MD;  Location: [facility] INVASIVE CV LAB;  Service: Cardiovascular;  Laterality: N/A;   RIGHT HEART CATH AND CORONARY ANGIOGRAPHY N/A 09/18/2023   Procedure: RIGHT HEART CATH AND CORONARY ANGIOGRAPHY;  Surgeon: Elmira Newman PARAS, MD;  Location: [facility] INVASIVE CV LAB;  Service: Cardiovascular;  Laterality: N/A;   HPI:  In with worsening left sided weakness and witnessed seizure in the ED.  PMH: R MCA stroke with left deficits and baseline memory deficits.  MRI of the brain was showing subacute infarct in the posterior left centrum semiovale, extensive chronic ischemia with multiple old infarcts and long standing parotid mass.   Assessment / Plan / Recommendation Clinical Impression  Cognitive/linguistic evaluation and motor speech screen were completed.  Cranial nerve exam was completed and unremarkable. Lingual, labial, facial and jaw range of motion and strength appreared to be adequate.  Facial sensation appeared to be intact and he did not endorse a  difference in sensation between the right and left side of his face.  He presented with likely dysarthria with mildly reduced intelligibility that patient reported to be baseline.  He completed portions of the Mini Mental State Exam achieving an overall score of 21/28.  He was fully oriented to  person only.  He was partially oriented to time stating the month, year and day of week correctly.  He did not know the date.  He was disoriented to place and situation.  He had good immediate recall of 3 novel words.  Given a short delay he was able to independently recall 2/3 words.  Semantic cue facilitated recall of third novel word. He struggled to complete the attention task achieving a score of 2/5.  Mild deficits noted for language skills.  He was able to name objects, repeat a short phrase and read/comprehend a sentence. He struggled to follow a 3 step command.  Informally he also struggled to provide logical solutions to simple problems ie 33% accuracy independently.  Given results of this evaluation suggest ongoing ST services to address deficits.  At first patient decline ongoing ST services but did eventually request ST services while admitted.    SLP Assessment  SLP Recommendation/Assessment: Patient needs continued Speech Language Pathology Services SLP Visit Diagnosis: Cognitive communication deficit (R41.841)     Assistance Recommended at Discharge  Intermittent Supervision/Assistance  Functional Status Assessment Patient has had a recent decline in their functional status and demonstrates the ability to make significant improvements in function in a reasonable and predictable amount of time.  Frequency and Duration min 1 x/week  2 weeks      SLP Evaluation Cognition  Overall Cognitive Status: No family/caregiver present to determine baseline cognitive functioning Arousal/Alertness: Awake/alert Orientation Level: Oriented to person;Disoriented to place;Disoriented to time;Disoriented to situation Attention: Sustained Sustained Attention: Impaired Sustained Attention Impairment: Verbal basic Memory: Impaired Memory Impairment: Decreased recall of new information Awareness: Impaired Awareness Impairment: Intellectual impairment Problem Solving: Impaired Problem Solving  Impairment: Verbal basic Safety/Judgment: Impaired       Comprehension  Auditory Comprehension Overall Auditory Comprehension: Impaired Yes/No Questions: Not tested Commands: Impaired One Step Basic Commands: 75-100% accurate Multistep Basic Commands: 25-49% accurate Conversation: Simple Reading Comprehension Reading Status: Within funtional limits    Expression Expression Primary Mode of Expression: Verbal Verbal Expression Overall Verbal Expression: Appears within functional limits for tasks assessed Initiation: No impairment Automatic Speech: Name;Social Response Level of Generative/Spontaneous Verbalization: Conversation Repetition: No impairment Naming: No impairment Pragmatics: No impairment Non-Verbal Means of Communication: Not applicable Written Expression Written Expression: Not tested   Oral / Motor  Oral Motor/Sensory Function Overall Oral Motor/Sensory Function: Within functional limits Motor Speech Overall Motor Speech: Impaired at baseline (per patient)           Eleanor Eagles, MA, CCC-SLP Acute Rehab SLP [phone]  Eleanor LOISE Eagles 02/22/2024, 9:39 AM

## 2024-02-22 NOTE — Evaluation (Signed)
 Physical Therapy Evaluation Patient Details Name: Joseph Ortega MRN: [id number] DOB: [birth year] Today's Date: 02/22/2024  History of Present Illness  68 yo M adm 12/5 L side weakness and erratic behavior. 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  Clinical Impression   Pt admitted secondary to problem above with deficits below. PTA patient resides with wife in home with a basement (with chair lift), with ramp to enter, and walked with RW modified independent. Pt currently requires CGA for bed mobility, and heavy min assist for sit to stand from elevated surfaces (primarily due to pain in rt hand from IV when he tries to push/pull up). Pt reports his left side feels a bit weaker and Left hand is tighter than usual. ?would benefit from walker splint on Left. Anticipate pt will progress to return home with HHPT for safety evaluation.  Anticipate patient will benefit from PT to address problems listed below. Will continue to follow acutely to maximize functional mobility, independence, and safety.           If plan is discharge home, recommend the following: A little help with walking and/or transfers;Assistance with cooking/housework;Direct supervision/assist for medications management;Direct supervision/assist for financial management;Assist for transportation;Help with stairs or ramp for entrance;Supervision due to cognitive status   Can travel by private vehicle        Equipment Recommendations None recommended by PT  Recommendations for Other Services  OT consult    Functional Status Assessment Patient has had a recent decline in their functional status and demonstrates the ability to make significant improvements in function in a reasonable and predictable amount of time.      Precautions / Restrictions Precautions Precautions: Fall Recall of Precautions/Restrictions: Impaired Precaution/Restrictions Comments: impulsive with getting up prior to PT prepared      Mobility  Bed Mobility Overal bed mobility: Needs Assistance Bed Mobility: Supine to Sit, Sit to Supine     Supine to sit: Contact guard, HOB elevated, Used rails Sit to supine: Supervision, HOB elevated, Used rails   General bed mobility comments: pt uses rails to exit bed to his rt (at home he goes to his left); some impulsivity therefore close-guarding    Transfers Overall transfer level: Needs assistance Equipment used: Rolling walker (2 wheels) Transfers: Sit to/from Stand Sit to Stand: Min assist, From elevated surface           General transfer comment: heavy min assist from elevated bed, elevated toilet; did not sit in recliner due to low height and pt felt he would be unable to stand    Ambulation/Gait Ambulation/Gait assistance: Min assist Gait Distance (Feet): 12 Feet (toileted, 12) Assistive device: Rolling walker (2 wheels) Gait Pattern/deviations: Step-to pattern, Decreased step length - left, Decreased stance time - left, Decreased weight shift to left   Gait velocity interpretation: <1.31 ft/sec, indicative of household ambulator   General Gait Details: pt uses Rt hand to help Lt hand grasp RW; pt tends to push RW too far ahead, especially at threshold to the bathroom  Stairs            Wheelchair Mobility     Tilt Bed    Modified Rankin (Stroke Patients Only) Modified Rankin (Stroke Patients Only) Pre-Morbid Rankin Score: Moderate disability Modified Rankin: Moderate disability     Balance Overall balance assessment:  Mild deficits observed, not formally tested (slight posterior imbalance as entering and exiting bathroom)                                           Pertinent Vitals/Pain Pain Assessment Pain Assessment: Faces Faces  Pain Scale: Hurts even more Pain Location: rt hand due to IV site Pain Descriptors / Indicators: Sharp Pain Intervention(s): Limited activity within patient's tolerance, Monitored during session, Other (comment) (made RN aware pt wants IV out)    Home Living Family/patient expects to be discharged to:: Private residence Living Arrangements: Spouse/significant other Available Help at Discharge: Family;Available 24 hours/day Type of Home: House Home Access: Ramped entrance;Stairs to enter     Alternate Level Stairs-Number of Steps:  (chair lift to basement) Home Layout: Two level;Laundry or work area in [employer] Equipment: Shower seat;Grab bars - tub/shower;Grab bars - Curator (2 wheels);Rollator (4 wheels) Additional Comments: pt reports above info for his new house that they are moving into this week    Prior Function Prior Level of Function : Needs assist             Mobility Comments: reports he does bed mobility, transfers and ambulation with RW independently ADLs Comments: wife assists with socks, but otherwise dresses himself     Extremity/Trunk Assessment   Upper Extremity Assessment Upper Extremity Assessment: Defer to OT evaluation (reports Lt hand is tighter than usual and difficult to get hand around handle of RW)    Lower Extremity Assessment Lower Extremity Assessment: LLE deficits/detail LLE Deficits / Details: reports feels slightly weaker and that he is dragging it when walking; pt is demonstrating foot clearance with swing through    Cervical / Trunk Assessment Cervical / Trunk Assessment: Other exceptions Cervical / Trunk Exceptions: overweight  Communication   Communication Communication: No apparent difficulties    Cognition Arousal: Alert Behavior During Therapy: Impulsive   PT - Cognitive impairments: No family/caregiver present to determine baseline, Awareness, Safety/Judgement                       PT -  Cognition Comments: pt impulsively trying to get OOB, then impulsively trying to stand from EOB, then from toilet; tried to push up from toilet with armrest of BSC beside him and it tipped over with LOB and return to sitting on toilet Following commands: Impaired Following commands impaired:  (multi-step not tested)     Cueing Cueing Techniques: Verbal cues, Gestural cues, Tactile cues     General Comments General comments (skin integrity, edema, etc.): VSS on RA (96% on room air) HR 80s, BP 177/73 after return to bed    Exercises     Assessment/Plan    PT Assessment Patient needs continued PT services  PT Problem List Decreased strength;Decreased range of motion;Decreased balance;Decreased mobility;Decreased cognition;Decreased knowledge of use of DME;Decreased safety awareness;Impaired tone;Obesity       PT Treatment Interventions DME instruction;Gait training;Functional mobility training;Therapeutic activities;Therapeutic exercise;Balance training;Neuromuscular re-education;Cognitive remediation;Patient/family education    PT Goals (Current goals can be found in the Care Plan section)  Acute Rehab PT Goals Patient Stated Goal: return to his new home this week PT Goal Formulation: With patient Time For Goal Achievement: 03/07/24 Potential to Achieve Goals: Good    Frequency Min 3X/week     Co-evaluation  AM-PAC PT 6 Clicks Mobility  Outcome Measure Help needed turning from your back to your side while in a flat bed without using bedrails?: A Little Help needed moving from lying on your back to sitting on the side of a flat bed without using bedrails?: A Little Help needed moving to and from a bed to a chair (including a wheelchair)?: A Little Help needed standing up from a chair using your arms (e.g., wheelchair or bedside chair)?: Total Help needed to walk in hospital room?: Total Help needed climbing 3-5 steps with a railing? : Total 6 Click Score:  12    End of Session Equipment Utilized During Treatment: Gait belt Activity Tolerance: Patient tolerated treatment well Patient left: in bed;with call bell/phone within reach;with nursing/sitter in room Nurse Communication: Mobility status;Other (comment) (did well on room air; may be difficult to stand from standard height recliner) PT Visit Diagnosis: Unsteadiness on feet (R26.81);Other abnormalities of gait and mobility (R26.89);Muscle weakness (generalized) (M62.81);Other symptoms and signs involving the nervous system (R29.898);Hemiplegia and hemiparesis Hemiplegia - Right/Left: Left Hemiplegia - dominant/non-dominant: Non-dominant Hemiplegia - caused by: Cerebral infarction    Time: 1441-1514 PT Time Calculation (min) (ACUTE ONLY): 33 min   Charges:   PT Evaluation $PT Eval Moderate Complexity: 1 Mod PT Treatments $Gait Training: 8-22 mins PT General Charges $$ ACUTE PT VISIT: 1 Visit          Macario RAMAN, PT Acute Rehabilitation Services  Office [phone]   Macario SHAUNNA Soja 02/22/2024, 3:34 PM

## 2024-02-22 NOTE — Plan of Care (Signed)

## 2024-02-22 NOTE — Hospital Course (Signed)
 Joseph Ortega is a 68 y.o.male with a history of prior right MCA stroke with left deficits who was transferred to the family medicine teaching Service at [facility] from the ICU for strokelike symptoms and seizure in the ED. His hospital course is detailed below:  Stroke CAD Seizure Patient was brought into the emergency room for strokelike symptoms including left-sided weakness.  Last known normal baseline was night of 12/4.  In the ED, his left extremity seems to be at baseline.  Patient was also found to have O2 sat of 88% on room air which improved with O2.  CT head was done which showed evidence of prior right MCA stroke.  Neurology was consulted who did not recommend further workup.  While in the ED, patient became tachypneic, hypoxic and hypertensive and then had witnessed seizure with unresponsiveness to painful stimuli.  Critical care was consulted and patient was loaded with 2g Keppra  and admitted to the ICU.   Neurology was consulted who recommended continuing loading 300 mg of Plavix  and ASA 81 daily for 3 weeks and then 75 mg Plavix  alone.SABRA MRI was completed which showed small subacute infarct and extensive chronic ischemia with multiple old infarcts.  CTA head and neck was ordered which showed severe left P2 PCA stenosis.  Patient reported nonadherence to outpatient Keppra  500 mg twice daily.  Echo showed EF of 50 to 55% with grade 1 diastolic dysfunction and elevated LA pressure and a small pericardial effusion. EEG ordered which showed cortical dysfunction arising from right hemisphere secondary to underlying structural abnormality/encephalomalacia.  Hypertensive emergency Patient found to have severe symptomatic hypertension with blood pressures of 210s/110s in the ED.  Patient was started on Cleviprex  drip in the ED and then transition to home losartan -HCTZ.  Patient's Coreg  was held in setting of potential heart failure exacerbation.  Acute hypoxic respiratory failure HFrEF  exacerbation Patient was requiring up to 6 L of nasal cannula in the ICU and appeared volume overloaded.  Suspected to have exacerbation of HFrEF.  Patient was diuresed with Lasix  and Coreg  was held in setting of heart failure exacerbation.  After diuresis, O2 was able to be weaned to room air***.  Other chronic conditions were medically managed with home medications and formulary alternatives as necessary (DMII, hypothyroidism)  PCP Follow-up Recommendations:

## 2024-02-22 NOTE — Progress Notes (Addendum)
 STROKE TEAM PROGRESS NOTE    SIGNIFICANT HOSPITAL EVENTS  Hypertensive emergency Hyperglycemia requiring insulin  infusion  INTERIM HISTORY/SUBJECTIVE  Incidental left mca territory infarct, no correlation to presentation.  Encephalopathy/recrudescence of prior left-sided weakness etiology of presenting symptoms likely.   [ ]  Loop recorder interrogation  Sitting up in bed. Left hemianopia and left-sided weakness present, patient states this is his baseline.    OBJECTIVE  CBC    Component Value Date/Time   WBC 10.6 (H) 02/22/2024 0452   RBC 4.13 (L) 02/22/2024 0452   HGB 13.0 02/22/2024 0452   HGB 12.7 (L) 09/30/2023 1629   HCT 37.7 (L) 02/22/2024 0452   HCT 39.9 09/30/2023 1629   PLT 190 02/22/2024 0452   PLT 162 09/30/2023 1629   MCV 91.3 02/22/2024 0452   MCV 100 (H) 09/30/2023 1629   MCH 31.5 02/22/2024 0452   MCHC 34.5 02/22/2024 0452   RDW 14.7 02/22/2024 0452   RDW 13.7 09/30/2023 1629   LYMPHSABS 0.8 02/21/2024 0320   LYMPHSABS 1.1 09/30/2023 1629   MONOABS 0.4 02/21/2024 0320   EOSABS 0.2 02/21/2024 0320   EOSABS 0.1 09/30/2023 1629   BASOSABS 0.0 02/21/2024 0320   BASOSABS 0.0 09/30/2023 1629    BMET    Component Value Date/Time   NA 136 02/22/2024 0452   NA 141 09/30/2023 1629   K 3.6 02/22/2024 0452   CL 97 (L) 02/22/2024 0452   CO2 30 02/22/2024 0452   GLUCOSE 230 (H) 02/22/2024 0452   BUN 23 02/22/2024 0452   BUN 21 09/30/2023 1629   CREATININE 1.06 02/22/2024 0452   CALCIUM  8.5 (L) 02/22/2024 0452   EGFR 96 09/30/2023 1629   GFRNONAA >60 02/22/2024 0452    IMAGING past 24 hours CT ANGIO HEAD NECK W WO CM Result Date: 02/22/2024 EXAM: CTA HEAD AND NECK WITH AND WITHOUT 02/22/2024 12:53:58 AM TECHNIQUE: CTA of the head and neck was performed with and without the administration of intravenous contrast. Multiplanar 2D and/or 3D reformatted images are provided for review. Automated exposure control, iterative reconstruction, and/or weight based  adjustment of the mA/kV was utilized to reduce the radiation dose to as low as reasonably achievable. Stenosis of the internal carotid arteries measured using NASCET criteria. COMPARISON: May 02, 2022 [facility] CLINICAL HISTORY: Stroke/TIA, determine embolic source FINDINGS: AORTIC ARCH AND ARCH VESSELS: No dissection or arterial injury. No significant stenosis of the brachiocephalic or subclavian arteries. CERVICAL CAROTID ARTERIES: No dissection, arterial injury, or hemodynamically significant stenosis by NASCET criteria. CERVICAL VERTEBRAL ARTERIES: No dissection, arterial injury, or significant stenosis. LUNGS AND MEDIASTINUM: Unremarkable. SOFT TISSUES: No acute abnormality. BONES: No acute abnormality. ANTERIOR CIRCULATION: No significant stenosis of the internal carotid arteries. No significant stenosis of the anterior cerebral arteries. No significant stenosis of the middle cerebral arteries. No aneurysm. POSTERIOR CIRCULATION: Severe left P2 PCA stenosis.  Patent right PCA. No significant stenosis of the basilar artery. No significant stenosis of the vertebral arteries. No aneurysm. OTHER: No dural venous sinus thrombosis on this non-dedicated study. IMPRESSION: 1. No large vessel occlusion. 2. Severe left P2 PCA stenosis. Electronically signed by: Gilmore Molt MD 02/22/2024 01:49 AM EST RP Workstation: [id number]   ECHOCARDIOGRAM COMPLETE Result Date: 02/21/2024    ECHOCARDIOGRAM REPORT   Patient Name:   Joseph Ortega Date of Exam: 02/21/2024 Medical Rec #:  [id number]        Height:       72.0 in Accession #:    [id number]  Weight:       241.6 lb Date of Birth:  Jan 16, 1956        BSA:          2.309 m Patient Age:    68 years         BP:           119/53 mmHg Patient Gender: M                HR:           65 bpm. Exam Location:  Inpatient Procedure: 2D Echo, Cardiac Doppler, Color Doppler and Intracardiac            Opacification Agent (Both Spectral and Color Flow Doppler were            utilized  during procedure). Indications:    Stroke  History:        Patient has prior history of Echocardiogram examinations, most                 recent 09/16/2023. CHF and Hypertrophic Cardiomyopathy, CAD,                 Abnormal ECG, Stroke, Signs/Symptoms:Edema; Risk Factors:Current                 Smoker, Hypertension, Sleep Apnea, Diabetes and Dyslipidemia.  Sonographer:    Juliene Rucks Referring Phys: [id number] CORTNEY E DE LA TORRE  Sonographer Comments: Patient is obese. IMPRESSIONS  1. 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  2. Left ventricular ejection fraction, by estimation, is 50 to 55%. The left ventricle has low normal function. The left ventricle demonstrates regional wall motion abnormalities (see scoring diagram/findings for description). There is severe concentric  left ventricular hypertrophy. Left ventricular diastolic parameters are consistent with Grade I diastolic dysfunction (impaired relaxation). Elevated left atrial pressure.  3. Right ventricular systolic function is normal. The right ventricular size is normal.  4. A small pericardial effusion is present.  5. The mitral valve is normal in structure. No evidence of mitral valve regurgitation. No evidence of mitral stenosis.  6. The aortic valve is tricuspid. Aortic valve regurgitation is not visualized. Aortic valve sclerosis is present, with no evidence of aortic valve stenosis.  7. The inferior vena cava is normal in size with greater than 50% respiratory variability, suggesting right atrial pressure of 3 mmHg. FINDINGS  Left Ventricle: Left ventricular ejection fraction, by estimation, is 50 to 55%. The left ventricle has low normal function. The left ventricle demonstrates regional wall motion abnormalities. Definity  contrast agent was given IV to delineate the left ventricular endocardial borders. The left ventricular internal cavity size was normal in size. There is severe concentric left ventricular hypertrophy. Left  ventricular diastolic parameters are consistent with Grade I diastolic dysfunction (impaired relaxation). Elevated left atrial pressure. Right Ventricle: The right ventricular size is normal. Right ventricular systolic function is normal. Left Atrium: Left atrial size was normal in size. Right Atrium: Right atrial size was normal in size. Pericardium: A small pericardial effusion is present. Mitral Valve: The mitral valve is normal in structure. No evidence of mitral valve regurgitation. No evidence of mitral valve stenosis. Tricuspid Valve: The tricuspid valve is normal in structure. Tricuspid valve regurgitation is trivial. No evidence of tricuspid stenosis. Aortic Valve: The aortic valve is tricuspid. Aortic valve regurgitation is not visualized. Aortic valve sclerosis is present, with no evidence of aortic valve stenosis. Pulmonic Valve: The pulmonic valve was normal in structure. Pulmonic  valve regurgitation is not visualized. No evidence of pulmonic stenosis. Aorta: The aortic root is normal in size and structure. Venous: The inferior vena cava is normal in size with greater than 50% respiratory variability, suggesting right atrial pressure of 3 mmHg. IAS/Shunts: No atrial level shunt detected by color flow Doppler. Additional Comments: Apical akinesis with overall low normal LV function.  LEFT VENTRICLE PLAX 2D LVIDd:         4.90 cm   Diastology LVIDs:         3.40 cm   LV e' medial:    3.41 cm/s LV PW:         1.60 cm   LV E/e' medial:  21.3 LV IVS:        1.20 cm   LV e' lateral:   3.41 cm/s LVOT diam:     2.10 cm   LV E/e' lateral: 21.3 LV SV:         76 LV SV Index:   33 LVOT Area:     3.46 cm  RIGHT VENTRICLE             IVC RV Basal diam:  3.80 cm     IVC diam: 1.70 cm RV Mid diam:    3.20 cm RV S prime:     16.20 cm/s TAPSE (M-mode): 2.0 cm LEFT ATRIUM           Index        RIGHT ATRIUM           Index LA diam:      5.10 cm 2.21 cm/m   RA Area:     15.60 cm LA Vol (A4C): 58.0 ml 25.12 ml/m  RA  Volume:   37.60 ml  16.29 ml/m  AORTIC VALVE LVOT Vmax:   103.00 cm/s LVOT Vmean:  66.000 cm/s LVOT VTI:    0.220 m  AORTA Ao Root diam: 3.40 cm MITRAL VALVE MV Area (PHT): 2.91 cm    SHUNTS MV Decel Time: 261 msec    Systemic VTI:  0.22 m MV E velocity: 72.80 cm/s  Systemic Diam: 2.10 cm MV A velocity: 82.80 cm/s MV E/A ratio:  0.88 Redell Shallow MD Electronically signed by Redell Shallow MD Signature Date/Time: 02/21/2024/4:47:19 PM    Final    EEG adult Result Date: 02/21/2024 Shelton Arlin KIDD, MD     02/21/2024  3:27 PM Patient Name: Joseph Ortega MRN: [id number] Epilepsy Attending: Arlin KIDD Shelton Referring Physician/Provider: Claudene Toribio BROCKS, MD Date: 02/21/2024 Duration: Patient history: 68 year old man with prior R MCA stroke w left deficits but able to ambulate p/w worsening L sided weakness. EEG to evaluate for seizure Level of alertness: Awake, asleep AEDs during EEG study: LEV, Ativan  Technical aspects: This EEG study was done with scalp electrodes positioned according to the 10-20 International system of electrode placement. Electrical activity was reviewed with band pass filter of 1-70Hz , sensitivity of 7 uV/mm, display speed of 52mm/sec with a 60Hz  notched filter applied as appropriate. EEG data were recorded continuously and digitally stored.  Video monitoring was available and reviewed as appropriate. Description: The posterior dominant rhythm consists of 8 Hz activity of moderate voltage (25-35 uV) seen predominantly in posterior head regions, symmetric and reactive to eye opening and eye closing. Sleep was characterized by vertex waves, sleep spindles (12 to 14 Hz), maximal frontocentral region. EEG showed continuous 5 to 7 Hz theta slowing in right hemisphere. Hyperventilation and photic stimulation were not performed.  ABNORMALITY - Continuous slow, right hemisphere  IMPRESSION: This study is suggestive of cortical dysfunction arising from right hemisphere likely secondary to underlying  structural abnormality/encephalomalacia. No seizures or epileptiform discharges were seen throughout the recording.  Priyanka O Yadav   VAS US  LOWER EXTREMITY VENOUS (DVT) Result Date: 02/21/2024  Lower Venous DVT Study Patient Name:  Joseph Ortega  Date of Exam:   02/21/2024 Medical Rec #: [id number]         Accession #:    [id number] Date of Birth: [birth year]         Patient Gender: M Patient Age:   65 years Exam Location:  [facility] Procedure:      VAS US  LOWER EXTREMITY VENOUS (DVT) Referring Phys: TORIBIO SHARPS --------------------------------------------------------------------------------  Indications: Edema.  Risk Factors: None identified. Limitations: Poor ultrasound/tissue interface. Comparison Study: No prior studies. Performing Technologist: Cordella Collet RVT  Examination Guidelines: A complete evaluation includes B-mode imaging, spectral Doppler, color Doppler, and power Doppler as needed of all accessible portions of each vessel. Bilateral testing is considered an integral part of a complete examination. Limited examinations for reoccurring indications may be performed as noted. The reflux portion of the exam is performed with the patient in reverse Trendelenburg.  +---------+---------------+---------+-----------+----------+--------------+ RIGHT    CompressibilityPhasicitySpontaneityPropertiesThrombus Aging +---------+---------------+---------+-----------+----------+--------------+ CFV      Full           Yes      Yes                                 +---------+---------------+---------+-----------+----------+--------------+ SFJ      Full                                                        +---------+---------------+---------+-----------+----------+--------------+ FV Prox  Full                                                        +---------+---------------+---------+-----------+----------+--------------+ FV Mid   Full                                                         +---------+---------------+---------+-----------+----------+--------------+ FV DistalFull                                                        +---------+---------------+---------+-----------+----------+--------------+ PFV      Full                                                        +---------+---------------+---------+-----------+----------+--------------+ POP      Full  Yes      Yes                                 +---------+---------------+---------+-----------+----------+--------------+ PTV      Full                                                        +---------+---------------+---------+-----------+----------+--------------+ PERO     Full                                                        +---------+---------------+---------+-----------+----------+--------------+   +---------+---------------+---------+-----------+----------+-------------------+ LEFT     CompressibilityPhasicitySpontaneityPropertiesThrombus Aging      +---------+---------------+---------+-----------+----------+-------------------+ CFV      Full           Yes      Yes                                      +---------+---------------+---------+-----------+----------+-------------------+ SFJ      Full                                                             +---------+---------------+---------+-----------+----------+-------------------+ FV Prox  Full                                                             +---------+---------------+---------+-----------+----------+-------------------+ FV Mid   Full                                                             +---------+---------------+---------+-----------+----------+-------------------+ FV Distal               Yes      Yes                                      +---------+---------------+---------+-----------+----------+-------------------+ PFV      Full                                                              +---------+---------------+---------+-----------+----------+-------------------+ POP      Full           Yes      Yes                                      +---------+---------------+---------+-----------+----------+-------------------+  PTV      Full                                                             +---------+---------------+---------+-----------+----------+-------------------+ PERO                                                  Not well visualized +---------+---------------+---------+-----------+----------+-------------------+     Summary: RIGHT: - There is no evidence of deep vein thrombosis in the lower extremity.  - No cystic structure found in the popliteal fossa.  LEFT: - There is no evidence of deep vein thrombosis in the lower extremity. However, portions of this examination were limited- see technologist comments above.  - No cystic structure found in the popliteal fossa.  *See table(s) above for measurements and observations. Electronically signed by Debby Robertson on 02/21/2024 at 2:13:34 PM.    Final    MR BRAIN WO CONTRAST Result Date: 02/21/2024 EXAM: MRI BRAIN WITHOUT CONTRAST 02/21/2024 12:22:33 PM TECHNIQUE: Multiplanar multisequence MRI of the head/brain was performed without the administration of intravenous contrast. COMPARISON: Head CT 02/21/2024 and MRI 11/01/2023. CLINICAL HISTORY: Stroke, follow up. Left sided weakness and erratic behavior. Witnessed seizure. FINDINGS: LIMITATIONS: The examination is mildly to moderately motion degraded. BRAIN AND VENTRICLES: A 1 cm focus of trace diffusion weighted and T2 weighted hyperintensity in the posterior left centrum semiovale with normal ADC is new from the prior MRI and most compatible with a subacute infarct. Large chronic posterior right MCA and right PCA territory infarcts are again noted with associated extensive chronic blood products and ex vacuo dilatation of the  right lateral ventricle. 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 Multiple chronic microhemorrhages are again noted as well as chronic blood products in the anterior right insular region. Patchy T2 hyperintensities elsewhere in the cerebral white matter bilaterally and in the pons are similar to the prior MRI and nonspecific but compatible with age advanced chronic small vessel ischemic disease. Chronic lacunar infarcts are again seen in the basal ganglia, left thalamus, pons, and right middle cerebellar peduncle. There is mild global cerebral atrophy. No mass, midline shift, hydrocephalus, or extra-axial fluid collection is identified. Major intracranial vascular flow voids are preserved. ORBITS: Bilateral cataract extraction. SINUSES AND MASTOIDS: No acute abnormality. BONES AND SOFT TISSUES: Normal marrow signal. 1.3 cm subcutaneous cyst in the right preauricular region, decreased in size from the prior MRI. Unchanged 2.3 cm T2 hyperintense/potentially cystic lesion along the deep lobe of the right parotid gland. IMPRESSION: 1. Small subacute infarct in the posterior left centrum semiovale. 2. Extensive chronic ischemia with multiple old infarcts as above. 3. Long-standing right parotid mass. Electronically signed by: Dasie Hamburg MD 02/21/2024 12:37 PM EST RP Workstation: [id number]    Vitals:   02/22/24 0300 02/22/24 0400 02/22/24 0500 02/22/24 0841  BP: 131/64 (!) 147/70 131/62   Pulse: (!) 58 (!) 59 (!) 54 (!) 53  Resp: (!) 22 (!) 21 18 18   Temp:  99 F (37.2 C)  98.2 F (36.8 C)  TempSrc:  Oral  Axillary  SpO2: 98% 99% 97% 95%  Weight:   106 kg  Height:       PHYSICAL EXAM General:  Alert, well-nourished, well-developed patient in no acute distress CV: SR/SB on monitor Respiratory:  Regular, unlabored respirations on room air  NEURO:  Mental Status: Pt is awake, alert, mildly lethargic, eyes open, orientated to age, place, time  Speech/Language: No  aphasia. Fluent language. Able to name and repeat.  Cranial Nerves:  II: PERRL. Left hemianopia (chronic) III, IV, VI: EOMI.No gaze palsy, tracks examiner fully.  V: Sensation is intact to light touch and symmetrical to face.  VII: No significant facial droop VIII: hearing intact to voice. IX, X: Palate elevates symmetrically. Slight dysarthria, with lethargy.  KP:Dynloizm shrug 5/5. XII: tongue is midline  Motor:  RUE 5/5  LUE flexion contracture in the elbow and fingers, proximal 3-/5, distally 2/5 finger movement. (Chronic) RLE 5/5  LLE 4+/5 proximal and 5/5 distally (chronic) Tone: is normal and bulk is normal Sensation- Intact to light touch bilaterally.  Coordination: R FTN intact, Unable to perform on left. (Chronic) Gait- deferred  Most Recent NIH 12    ASSESSMENT/PLAN  Joseph Ortega is a 68 y.o. 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 CTH negative. Hypertensive and hyperglycemic in ED, admitted for further workup and ICU care.  NIH on Admission 11.  Stroke: left centrum semiovale infarct, etiology:  small vessel disease in patient with multiple uncontrolled risk factors Code Stroke CT head No acute intracranial abnormality. ASPECTS is 10. Old infarcts in the posterior right MCA territory and the right PCA territory.   CTA head & neck No large vessel occlusion. Severe left P2 PCA stenosis. MRI Small subacute infarct posterior left centrum semiovale. Extensive chronic ischemia with multiple old infarcts.  2D Echo: EF 50-55% EEG no seizure Loop Recorder Interrogation: pending  Patient states that he has received messages from doctor that LR was not working at times. LDL 101 HgbA1c 10.6 in 01/2024 UDS negative VTE prophylaxis - Heparin  SQ aspirin  81 mg daily prior to admission, now on aspirin  81 mg daily and clopidogrel  75 mg daily for 3 weeks  and then Plavix   alone. Therapy recommendations: Home health PT Disposition:  pending  Hx of Stroke/TIA 02/2021 admitted for left-sided weakness.  CT showed right frontal HT/SAH.  CTA head and neck unremarkable.  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  MRI showed right frontal infarct with hemorrhagic transformation.  Repeat MRI later showed new right occipital infarct with enlargement of posterior frontal infarct, stable HT and SAH.  CTA head and neck showed focal calcification within the right PCA distal P3 segment.  EF 45 to 50%, LDL 106, A1c is 11.1.  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  Discharged on aspirin  81 and Crestor . Outpatient 30-day cardiac event monitoring 05/2021 showed no A-fib. 04/2022 admitted for right MCA large infarct with hemorrhagic termination.  MRI showed left P2 severe stenosis.  EF 50 to 55%.  LDL 33, A1c 8.3.  Discharged on aspirin  and Topamax .  Loop recorder placed.  History of seizure August 2025 admitted for worsening left-sided weakness.  Found to have left facial twitching, left gaze and left arm jerking.  Received Versed  and loaded with Keppra .  EEG no seizure.  Strong Keppra  500 twice daily. Home meds: Keppra  500mg  BID, continued Chart review states patient not taking.  However, patient stated he was still taking his Keppra  when discussed this  morning.  Hypertensive emergency Home meds:  Losartan  50mg  daily, Coreg  12.5mg  BID, Lasix  40 mg daily Stable now Off Cleviprex  On HCTZ, Cozaar  BP goal: BP <180/105 Long-term BP goal normotensive  Hyperlipidemia Home meds:  Crestor  40mg  LDL 101, goal < 70 Add Zetia   Continue statin and Zetia  at discharge  Diabetes type II Uncontrolled Home meds: Trulicity  weekly, NovoLog  AC HgbA1c 10.6, goal < 7.0 On Lantus  CBGs SSI Recommend close follow-up with PCP for better DM control  Other Stroke Risk Factors Obesity, Body mass index is 31.69 kg/m., BMI  >/= 30 associated with increased stroke risk, recommend weight loss, diet and exercise as appropriate  Obstructive sleep apnea, not on CPAP at home  Other medical issues Anxiety Status post ACDF Hypothyroidism on Synthroid  Leukocytosis WBC 10.6  Hospital day # 1  Ok to transfer out of ICU from neurology standpoint  Pt seen by Neuro NP/APP with MD. Note/plan to be edited by MD as needed.    Joseph JAYSON Likes, DNP [facility] Please use AMION for contact information & EPIC for messaging.  ATTENDING NOTE: I reviewed above note and agree with the assessment and plan. Pt was seen and examined.   RN at the bedside. Pt is awake, alert, mildly lethargic, eyes open, orientated to age, place, time. No aphasia, fluent language, following all simple commands, slight dysarthria with lethargy. Able to name and repeat. No gaze palsy, tracking bilaterally, left hemianopia which is chronic. No significant facial droop. Tongue midline. RUE 5/5, LUE flexion contracture in the elbow and fingers, proximal 3-/5, distally 2/5 finger movement. RLE 5/5, LLE 4+/5 proximal and 5/5 distally. Sensation symmetrical bilaterally, R FTN intact, gait not tested.   For detailed assessment and plan, please refer to above as I have made changes wherever appropriate.   Joseph Cummins, MD PhD Stroke Neurology 02/22/2024 6:47 PM  This patient is critically ill due to hypertensive emergency, small stroke, history of stroke with hemorrhagic transmission, history of seizure and at significant risk of neurological worsening, death form hypertensive encephalopathy, recurrent stroke, hemorrhagic admission, seizure, status epilepticus. This patient's care requires constant monitoring of vital signs, hemodynamics, respiratory and cardiac monitoring, review of multiple databases, neurological assessment, discussion with family, other specialists and medical decision making of high complexity. I spent 40 minutes of neurocritical  care time in the care of this patient.  I discussed with CCM Dr. Kassie    To contact Stroke Continuity provider, please refer to [email or web address]. After hours, contact General Neurology

## 2024-02-22 NOTE — Progress Notes (Signed)
 [facility] ADULT ICU REPLACEMENT PROTOCOL   The patient does apply for the [facility] Adult ICU Electrolyte Replacment Protocol based on the criteria listed below:   1.Exclusion criteria: TCTS, ECMO, Dialysis, and Myasthenia Gravis patients 2. Is GFR >/= 30 ml/min? Yes.    Patient's GFR today is >60 3. Is SCr </= 2? Yes.   Patient's SCr is 1.06 mg/dL 4. Did SCr increase >/= 0.5 in 24 hours? No. 5.Pt's weight >40kg  Yes.   6. Abnormal electrolyte(s):   K 3.6, Mg 1.9  7. Electrolytes replaced per protocol 8.  Call MD STAT for K+ </= 2.5, Phos </= 1, or Mag </= 1 Physician:  A. Haze Blackbird R Clete Kuch 02/22/2024 6:10 AM

## 2024-02-22 NOTE — Progress Notes (Signed)
 NAME:  Joseph Ortega, MRN:  [id number], DOB:  [birth year], LOS: 1 ADMISSION DATE:  02/21/2024, CONSULTATION DATE:  02/21/24 REFERRING MD:  Horton, CHIEF COMPLAINT:  weakness   History of Present Illness:  68 year old man with prior R MCA stroke w left deficits but able to ambulate p/w worsening L sided weakness.  Brought in as code stroke; given mild symptoms and ineligibility for lytics/thrombectomy no vessel imaging done; CT head stable.  Found to also be hypoxemic and hypertensive.  While in ER witnessed unresponsiveness and seizure at 5a.  Loaded with keppra  and ordered for MRI; thought is we are dealing with some form of hypertensive crisis vs. PRES.  PCCM to admit.  Patient still a bit out of it but able to say no pain, mild dyspnea, and ongoing dense L sided weakenss.  Pertinent  Medical History   Past Medical History:  Diagnosis Date   Anxiety    Arthritis    Depression    Diabetes mellitus without complication (HCC)    Hypertensive crisis 02/22/2023   Hypothyroidism    Sleep apnea    wears CPAP   Stroke (HCC)    1 stroke 2/24, and 2 strokes on 03/09/21   Thyroid disease      Significant Hospital Events: Including procedures, antibiotic start and stop dates in addition to other pertinent events   02/21/24 admit to ICU  Interim History / Subjective:  Off cleviprex  Weaned off insulin  gtt and started on basal insulin  Excellent UOP on diuresis Remains on 5L O2 Objective    Blood pressure 131/62, pulse (!) 53, temperature 98.2 F (36.8 C), temperature source Axillary, resp. rate 18, height 6' (1.829 m), weight 106 kg, SpO2 95%.        Intake/Output Summary (Last 24 hours) at 02/22/2024 1033 Last data filed at 02/22/2024 0800 Gross per 24 hour  Intake 88.16 ml  Output 3550 ml  Net -3461.84 ml   Filed Weights   02/21/24 0300 02/21/24 0730 02/22/24 0500  Weight: 115 kg 109.6 kg 106 kg   Physical Exam: General: Chronically ill-appearing, no acute distress HENT: [place],  AT, OP clear, MMM Eyes: EOMI, no scleral icterus Respiratory: Improved basilar rales Cardiovascular: RRR, -M/R/G, no JVD GI: BS+, soft, nontender Extremities: 1+ pitting edema in lower extremities,-tenderness Neuro: AAO x4, CNII-XII grossly intact, LUE weakness GU: Foley in place  Imaging, labs and test in EMR in the last 24 hours reviewed independently by me. Pertinent findings below: K 3.6 MR brain 12/5 Small subacute infarct in the posterior left centrum semiovale.  DVT LE - neg  Resolved problem list   Assessment and Plan   This is a 68 year old male with past medical history of prior CVA with residual left hemiparesis, diabetes, seizures (on Keppra ), who presented to the emergency department with concerns of altered mental status and ultimately had a seizure.  Patient also found to be severely hypertensive.  #Seizure Likely exacerbated in setting of HTN.  He was loaded with Keppra  2000 mg.  Seizure likely in the setting of nonadherence to medications.  Patient is to be on Keppra  500 mg twice daily outpatient.  He endorses nonadherence.  He states he just forgets to take his medicines. - Seizure precautions - Restart home Keppra  500 mg twice daily - Neurology following, appreciate recommendation - Follow-up EEG  #Hypertensive urgency - resolved #Volume overload - Off cleviprex  - Reduce IV lasix  60 mg BID - Resume home losartan -HCTZ 50-12.5 mg daily - Holding home Coreg  in  the setting of bradycardia - Monitor renal function  #Acute hypoxemic respiratory failure #HFrEF exacerbation Patient currently requiring 6 L nasal cannula to maintain oxygen saturations of 97%.  Patient is volume overloaded.  Likely exacerbation in the setting of severe symptomatic hypertension.  Patient had cardiac MRI on September 17, 2023 showing left ejection fraction of 44%. - Wean O2 for goal >88% - IV lasix  60 mg BID - CXR tomorrow - Daily ins and outs - Daily weights - Hold home beta-blocker in the  setting of heart failure exacerbation  #Small subacute infarct in the posterior left centrum semiovale #Prior CVA with left residual hemiparesis Patient with past medical history of prior CVA.  Patient does have residual left-sided hemiparesis.  No further focal neurological deficits appreciated on my exam this morning.  Lipid panel in 3 months ago showed total cholesterol 152, LDL 88, triglycerides 59.  LDL not at goal likely in the setting of nonadherence. - Appreciate Neuro input - Resume home aspirin  81 mg daily and plavix  75 mg daily - Resume home Crestor  40 mg daily  #Type 2 diabetes A1c 2 weeks ago 10.6.  Not well-controlled.  Home medications include Trulicity  0.75 mg weekly, Levemir  20-40 mg daily, NovoLog  15-20 mg 3 times daily.  Blood glucose 423. - Off insulin  gtt - Increase glargine 20 U - CBG ACHS, SSI  #Hypothyroidism TSH 11.215. T4 0.67 wnl -Resume home synthroid  100 mcg daily  #Hypokalemia - stable on diuresis - Trend - Replete as needed  No ICU needs. Ready to transfer  Labs   CBC: Recent Labs  Lab 02/21/24 0320 02/21/24 0323 02/22/24 0452  WBC 7.1  --  10.6*  NEUTROABS 5.7  --   --   HGB 13.3 13.9 13.0  HCT 40.5 41.0 37.7*  MCV 96.2  --  91.3  PLT 157  --  190    Basic Metabolic Panel: Recent Labs  Lab 02/21/24 0320 02/21/24 0323 02/21/24 1037 02/21/24 1648 02/22/24 0452  NA 137 140 141 141 136  K 4.1 4.2 4.0 3.4* 3.6  CL 101 101 100 102 97*  CO2 26  --  27 30 30   GLUCOSE 423* 427* 442* 107* 230*  BUN 17 20 18 19 23   CREATININE 0.88 0.80 0.85 0.84 1.06  CALCIUM  9.1  --  9.2 9.0 8.5*  MG  --   --   --  2.0 1.9  PHOS  --   --   --   --  3.5   GFR: Estimated Creatinine Clearance: 86.3 mL/min (by C-G formula based on SCr of 1.06 mg/dL). Recent Labs  Lab 02/21/24 0320 02/22/24 0452  WBC 7.1 10.6*    Liver Function Tests: Recent Labs  Lab 02/21/24 0320  AST 21  ALT 8  ALKPHOS 148*  BILITOT 0.8  PROT 7.3  ALBUMIN 3.4*   No  results for input(s): LIPASE, AMYLASE in the last 168 hours. No results for input(s): AMMONIA in the last 168 hours.  ABG    Component Value Date/Time   HCO3 27.8 10/31/2023 2357   TCO2 26 02/21/2024 0323   O2SAT 96 10/31/2023 2357     Coagulation Profile: Recent Labs  Lab 02/21/24 0320  INR 1.1    Cardiac Enzymes: No results for input(s): CKTOTAL, CKMB, CKMBINDEX, TROPONINI in the last 168 hours.  HbA1C: HbA1c, POC (controlled diabetic range)  Date/Time Value Ref Range Status  02/03/2024 01:40 PM 10.6 (A) 0.0 - 7.0 % Final  05/01/2023 09:38 AM 11.5 (A) 0.0 -  7.0 % Final   Hgb A1c MFr Bld  Date/Time Value Ref Range Status  11/01/2023 05:44 PM 10.9 (H) 4.8 - 5.6 % Final    Comment:    (NOTE) Diagnosis of Diabetes The following HbA1c ranges recommended by the American Diabetes Association (ADA) may be used as an aid in the diagnosis of diabetes mellitus.  Hemoglobin             Suggested A1C NGSP%              Diagnosis  <5.7                   Non Diabetic  5.7-6.4                Pre-Diabetic  >6.4                   Diabetic  <7.0                   Glycemic control for                       adults with diabetes.    09/13/2023 04:43 AM 11.8 (H) 4.8 - 5.6 % Final    Comment:    (NOTE) Diagnosis of Diabetes The following HbA1c ranges recommended by the American Diabetes Association (ADA) may be used as an aid in the diagnosis of diabetes mellitus.  Hemoglobin             Suggested A1C NGSP%              Diagnosis  <5.7                   Non Diabetic  5.7-6.4                Pre-Diabetic  >6.4                   Diabetic  <7.0                   Glycemic control for                       adults with diabetes.      CBG: Recent Labs  Lab 02/21/24 1906 02/21/24 2007 02/21/24 2338 02/22/24 0339 02/22/24 0840  GLUCAP 85 172* 242* 235* 316*    Review of Systems:   Limited due to postictal state  Past Medical History:  He,  has a  past medical history of Anxiety, Arthritis, Depression, Diabetes mellitus without complication (HCC), Hypertensive crisis (02/22/2023), Hypothyroidism, Sleep apnea, Stroke (HCC), and Thyroid disease.   Surgical History:   Past Surgical History:  Procedure Laterality Date   ANTERIOR CERVICAL DECOMP/DISCECTOMY FUSION N/A 11/23/2022   Procedure: Anterior Cervical Decompression Fusion  Cervical four-five;  Surgeon: Lanis Pupa, MD;  Location: [facility] OR;  Service: Neurosurgery;  Laterality: N/A;   GROIN DEBRIDEMENT Left    ingrown hair, possibly abscesses, surgery to treat infection   HERNIA REPAIR Left    inguinal   IR ANGIO INTRA EXTRACRAN SEL COM CAROTID INNOMINATE BILAT MOD SED  03/09/2021   IR ANGIO VERTEBRAL SEL VERTEBRAL BILAT MOD SED  03/09/2021   IR US  GUIDE VASC ACCESS RIGHT  03/09/2021   LOOP RECORDER INSERTION N/A 05/07/2022   Procedure: LOOP RECORDER INSERTION;  Surgeon: Cindie Ole DASEN, MD;  Location: [facility] INVASIVE CV LAB;  Service: Cardiovascular;  Laterality: N/A;  RIGHT HEART CATH AND CORONARY ANGIOGRAPHY N/A 09/18/2023   Procedure: RIGHT HEART CATH AND CORONARY ANGIOGRAPHY;  Surgeon: Elmira Newman PARAS, MD;  Location: [facility] INVASIVE CV LAB;  Service: Cardiovascular;  Laterality: N/A;     Social History:   reports that he has never smoked. He has never used smokeless tobacco. He reports current alcohol use. He reports that he does not use drugs.   Family History:  His family history includes Diabetes in his father.   Allergies No Known Allergies   Home Medications  Prior to Admission medications   Medication Sig Start Date End Date Taking? Authorizing Provider  acetaminophen  (TYLENOL ) 325 MG tablet Take 1-2 tablets (325-650 mg total) by mouth every 4 (four) hours as needed for mild pain (pain score 1-3). 03/11/23   Love, Sharlet RAMAN, PA-C  ascorbic acid  (VITAMIN C ) 500 MG tablet Take 1 tablet (500 mg total) by mouth daily with supper. 03/11/23   Love, Sharlet RAMAN, PA-C   aspirin  EC 81 MG tablet Take 1 tablet (81 mg total) by mouth daily. Swallow whole. 06/03/23   Rosemarie Eather RAMAN, MD  busPIRone  (BUSPAR ) 10 MG tablet Take 10 mg by mouth 3 (three) times daily. 02/06/23   [provider]  carvedilol  (COREG ) 12.5 MG tablet Take 1 tablet (12.5 mg total) by mouth 2 (two) times daily with a meal. 02/03/24   Howell Lunger, DO  docusate sodium  (COLACE) 100 MG capsule Take 100 mg by mouth daily.    [provider]  Dulaglutide  (TRULICITY ) 0.75 MG/0.5ML SOAJ Inject 0.75 mg into the skin once a week. 02/03/24   Howell Lunger, DO  DULoxetine  HCl 40 MG CPEP Take 40 mg by mouth daily.    [provider]  furosemide  (LASIX ) 40 MG tablet Take 1 tablet (40 mg total) by mouth daily. 02/03/24 03/04/24  Howell Lunger, DO  gabapentin  (NEURONTIN ) 300 MG capsule TAKE 1 CAPSULE BY MOUTH THREE TIMES A DAY 12/09/23   Howell Lunger, DO  guanFACINE (INTUNIV) 2 MG TB24 ER tablet Take 2 mg by mouth daily. 06/07/23   [provider]  insulin  glargine (LANTUS ) 100 UNIT/ML Solostar Pen Inject 10 Units into the skin at bedtime. 02/03/24   Howell Lunger, DO  levETIRAcetam  (KEPPRA ) 500 MG tablet Take 1 tablet (500 mg total) by mouth 2 (two) times daily. 11/02/23   Howell Lunger, DO  levothyroxine  (SYNTHROID ) 100 MCG tablet Take 1 tablet (100 mcg total) by mouth daily at 6 (six) AM. 02/04/24 03/05/24  Howell Lunger, DO  losartan -hydrochlorothiazide  (HYZAAR) 50-12.5 MG tablet Take 1 tablet by mouth daily. 02/03/24   Howell Lunger, DO  Multiple Vitamins-Minerals (ALIVE MENS GUMMY MULTIVITAMINS) CHEW Chew 3 tablets by mouth daily.    [provider]  NOVOLOG  FLEXPEN 100 UNIT/ML FlexPen Inject 15-20 Units into the skin 3 (three) times daily with meals. Inject 15-20 units into the skin 3 times daily with meals 09/30/23   Howell Lunger, DO  potassium chloride  SA (KLOR-CON  M20) 20 MEQ tablet Take 1 tablet (20 mEq total) by mouth daily. Patient not  taking: Reported on 11/05/2023 03/11/23   Love, Sharlet RAMAN, PA-C  rosuvastatin  (CRESTOR ) 40 MG tablet Take 1 tablet (40 mg total) by mouth daily. 03/11/23   Love, Sharlet RAMAN, PA-C  traZODone  (DESYREL ) 100 MG tablet Take 100 mg by mouth at bedtime as needed for sleep. Patient not taking: Reported on 11/05/2023 09/02/23   [provider]     Critical care time:    Care Time: 50 min  Slater Staff, M.D. [facility] Pulmonary/Critical Care Medicine 02/22/2024 10:33 AM   See Tracey for personal pager For hours between 7 PM to 7 AM, please call Elink for urgent questions

## 2024-02-22 NOTE — Plan of Care (Incomplete)
 RN at the bedside. Pt is awake, alert, mildly lethargic, eyes open, orientated to age, place, time. No aphasia, fluent language, following all simple commands, slight dysarthria with lethargy. Able to name and repeat. No gaze palsy, tracking bilaterally, left hemianopia which is chronic. No significant facial droop. Tongue midline. RUE 5/5, LUE flexion contracture in the elbow and fingers, proximal 3-/5, distally 2/5 finger movement. RLE 5/5, LLE 4+/5 proximal and 5/5 distally. Sensation symmetrical bilaterally, R FTN intact, gait not tested.

## 2024-02-23 ENCOUNTER — Inpatient Hospital Stay (HOSPITAL_COMMUNITY)

## 2024-02-23 DIAGNOSIS — I10 Essential (primary) hypertension: Secondary | ICD-10-CM

## 2024-02-23 DIAGNOSIS — Z794 Long term (current) use of insulin: Secondary | ICD-10-CM

## 2024-02-23 DIAGNOSIS — G40909 Epilepsy, unspecified, not intractable, without status epilepticus: Secondary | ICD-10-CM

## 2024-02-23 DIAGNOSIS — E1165 Type 2 diabetes mellitus with hyperglycemia: Secondary | ICD-10-CM

## 2024-02-23 DIAGNOSIS — I63411 Cerebral infarction due to embolism of right middle cerebral artery: Secondary | ICD-10-CM

## 2024-02-23 DIAGNOSIS — I5043 Acute on chronic combined systolic (congestive) and diastolic (congestive) heart failure: Secondary | ICD-10-CM

## 2024-02-23 LAB — BASIC METABOLIC PANEL WITH GFR
Anion gap: 7 (ref 5–15)
BUN: 24 mg/dL — ABNORMAL HIGH (ref 8–23)
CO2: 34 mmol/L — ABNORMAL HIGH (ref 22–32)
Calcium: 8.6 mg/dL — ABNORMAL LOW (ref 8.9–10.3)
Chloride: 98 mmol/L (ref 98–111)
Creatinine, Ser: 0.98 mg/dL (ref 0.61–1.24)
GFR, Estimated: >60 mL/min (ref >60)
Glucose, Bld: 157 mg/dL — ABNORMAL HIGH (ref 70–99)
Potassium: 3.1 mmol/L — ABNORMAL LOW (ref 3.5–5.1)
Sodium: 139 mmol/L (ref 135–145)

## 2024-02-23 LAB — GLUCOSE, CAPILLARY
Glucose-Capillary: 225 mg/dL — ABNORMAL HIGH (ref 70–99)
Glucose-Capillary: 229 mg/dL — ABNORMAL HIGH (ref 70–99)
Glucose-Capillary: 218 mg/dL — ABNORMAL HIGH (ref 70–99)
Glucose-Capillary: 240 mg/dL — ABNORMAL HIGH (ref 70–99)

## 2024-02-23 LAB — CBC
HCT: 36.8 % — ABNORMAL LOW (ref 39.0–52.0)
Hemoglobin: 12.7 g/dL — ABNORMAL LOW (ref 13.0–17.0)
MCH: 31.8 pg (ref 26.0–34.0)
MCHC: 34.5 g/dL (ref 30.0–36.0)
MCV: 92.2 fL (ref 80.0–100.0)
Platelets: 156 K/uL (ref 150–400)
RBC: 3.99 MIL/uL — ABNORMAL LOW (ref 4.22–5.81)
RDW: 15 % (ref 11.5–15.5)
WBC: 6.3 K/uL (ref 4.0–10.5)
nRBC: 0 % (ref 0.0–0.2)

## 2024-02-23 LAB — LEVETIRACETAM LEVEL: Levetiracetam Lvl: <2 ug/mL — ABNORMAL LOW (ref 10.0–40.0)

## 2024-02-23 LAB — MAGNESIUM: Magnesium: 2 mg/dL (ref 1.7–2.4)

## 2024-02-23 LAB — PHOSPHORUS: Phosphorus: 4.1 mg/dL (ref 2.5–4.6)

## 2024-02-23 MED ORDER — POTASSIUM CHLORIDE 10 MEQ/100ML IV SOLN
10.0000 meq | INTRAVENOUS | Status: AC
  Administered 2024-02-23 (×4): 10 meq via INTRAVENOUS
  Filled 2024-02-23 (×2): qty 100

## 2024-02-23 MED ORDER — ENOXAPARIN SODIUM 40 MG/0.4ML IJ SOSY: 40.0000 mg | PREFILLED_SYRINGE | INTRAMUSCULAR | Status: DC

## 2024-02-23 MED ORDER — POTASSIUM CHLORIDE CRYS ER 20 MEQ PO TBCR
20.0000 meq | EXTENDED_RELEASE_TABLET | ORAL | Status: AC
  Administered 2024-02-23 (×2): 20 meq via ORAL
  Filled 2024-02-23 (×2): qty 1

## 2024-02-23 NOTE — Assessment & Plan Note (Addendum)
 Diuresing w/ Lasix  60BID IV, w/ good UOP. Cr downtrending.  - Cont Lasix  60 BID IV. Consider transitioning to PO if Cr bumps.

## 2024-02-23 NOTE — Assessment & Plan Note (Signed)
 BG 150-220, just increase glargine to 20u BID last night. Will CTM glucose curve, - Cont lantus  20u BID - Cont SSI

## 2024-02-23 NOTE — Assessment & Plan Note (Addendum)
 MRI showed small subacute infarct of posterior left centrum semiovale.  No seizure activity ON. - On DAPT ASA 81 and plavix  75 x3wks, then plavix  alone - Continue home Keppra  - Neurology consulted, appreciate recs.

## 2024-02-23 NOTE — Progress Notes (Signed)
   02/23/24 2301  BiPAP/CPAP/SIPAP  $ Non-Invasive Home Ventilator  Subsequent  BiPAP/CPAP/SIPAP Pt Type Adult  BiPAP/CPAP/SIPAP Resmed  Mask Type Full face mask  Mask Size Medium  Respiratory Rate 18 breaths/min  PEEP 5 cmH20  Flow Rate 3 lpm  Patient Home Machine No  Patient Home Mask No  Patient Home Tubing No  Auto Titrate No  Device Plugged into RED Power Outlet Yes  BiPAP/CPAP /SiPAP Vitals  Resp 18  MEWS Score/Color  MEWS Score 0  MEWS Score Color Green

## 2024-02-23 NOTE — Progress Notes (Signed)
     Daily Progress Note Intern Pager: [phone]  Patient name: Joseph Ortega Medical record number: [id number] Date of birth: 04/11/1955 Age: 68 y.o. Gender: male  Primary Care Provider: Howell Lunger, DO Consultants: Neurology Code Status: Full  Pt Overview and Major Events to Date:  12/5: Admitted to ICU 12/7: Transferred to [facility] med/tele  Assessment and Plan: Joseph Ortega is a 68 year old M with history of prior CVA with residual left hemiparesis, T2DM, seizures on Keppra  was admitted for seizure and uncontrolled hypertension.  Patient was initially admitted to ICU for IV cleviprex , HTN improved, and transferred now to [facility] Med Tele.  Assessment & Plan Hypertension Transitioned off Cleviprex , resumed home losartan  hydrochlorothiazide . HTN improving but still above goal. - Cont home Losartan -hydrochlorothiazide  50-12.5 - Holding home Coreg  in setting of bradycardia - AM CBC, BMP, Mg  Seizure disorder (HCC) CVA (cerebral vascular accident) (HCC) MRI showed small subacute infarct of posterior left centrum semiovale.  No seizure activity ON. - On DAPT ASA 81 and plavix  75 x3wks, then plavix  alone - Continue home Keppra  - Neurology consulted, appreciate recs. CHF exacerbation (HCC) Diuresing w/ Lasix  60BID IV, w/ good UOP. Cr downtrending.  - Cont Lasix  60 BID IV. Consider transitioning to PO if Cr bumps.  T2DM (type 2 diabetes mellitus) (HCC) BG 150-220, just increase glargine to 20u BID last night. Will CTM glucose curve, - Cont lantus  20u BID - Cont SSI Hypothyroidism TSH initially elevated to 11.215.  Likely in setting of acute illness. - Continue home Synthroid  100 mcg daily - Repeat TSH outpatient in 1 month  FEN/GI: Heart health, carb modified PPx: SQ heparin  Dispo: Pending clinical improvement.   Subjective:  Reports feeling well compared to when he first came in. Reports that he plans to move to [place]  soon, and he has not establish care with a PCP in [place]  yet.  He  is currently at [facility] clinic patient.  Objective: Temp:  [97.9 F (36.6 C)-98.2 F (36.8 C)] 98 F (36.7 C) (12/07 0800) Pulse Rate:  [47-71] 55 (12/07 0800) Resp:  [12-23] 18 (12/07 0800) BP: (110-159)/(54-107) 124/71 (12/07 0800) SpO2:  [64 %-100 %] 92 % (12/07 0800) FiO2 (%):  [28 %] 28 % (12/06 2200) Physical Exam: General: Alert, pleasant older man sitting up in bed. NAD.  Cardiovascular: RRR Respiratory: CTAB.  Normal work of breathing on room air Abdomen: Soft, nondistended, nontender Extremities: 1+ pitting edema  Laboratory: Most recent CBC Lab Results  Component Value Date   WBC 6.3 02/23/2024   HGB 12.7 (L) 02/23/2024   HCT 36.8 (L) 02/23/2024   MCV 92.2 02/23/2024   PLT 156 02/23/2024   Most recent BMP    Latest Ref Rng & Units 02/23/2024    4:52 AM  BMP  Glucose 70 - 99 mg/dL 842   BUN 8 - 23 mg/dL 24   Creatinine 9.38 - 1.24 mg/dL 9.01   Sodium 864 - 854 mmol/L 139   Potassium 3.5 - 5.1 mmol/L 3.1   Chloride 98 - 111 mmol/L 98   CO2 22 - 32 mmol/L 34   Calcium  8.9 - 10.3 mg/dL 8.6      Elicia Hamlet, MD 02/23/2024, 8:27 AM  PGY-3, [facility] FPTS Intern pager: [phone], text pages welcome Secure chat group [facility] Teaching Service

## 2024-02-23 NOTE — Plan of Care (Signed)

## 2024-02-23 NOTE — Progress Notes (Signed)
 Pharmacy Electrolyte Replacement  Recent Labs:  Recent Labs    02/23/24 0452  K 3.1*  MG 2.0  PHOS 4.1  CREATININE 0.98    Low Critical Values (K </= 2.5, Phos </= 1, Mg </= 1) Present: None  MD Contacted: N/A  Plan:  Kcl 80mEq PO + Kcl 40mEq IV scheduled for this AM by primary Repeat BMP in AM  Thank you for allowing pharmacy to be a part of this patient's care.  Shelba Collier, PharmD, BCPS Clinical Pharmacist

## 2024-02-23 NOTE — Progress Notes (Addendum)
 STROKE TEAM PROGRESS NOTE    SIGNIFICANT HOSPITAL EVENTS  12/5: Presented w/worsening left-sided deficits (residual from previous cva), Hypertensive emergency, Hypoxemia and Hyperglycemia requiring insulin  infusion  MRI shows incidental left MCA territory infarct, no correlation to presentation symptoms. Encephalopathy/recrudescence of prior left-sided weakness etiology of presenting symptoms likely.   INTERIM HISTORY/SUBJECTIVE  [ ]  Loop recorder interrogation to be completed tomorrow.   Sitting up in bed. Left hemianopia and left-sided weakness present, patient states this is at his baseline level.    OBJECTIVE  CBC    Component Value Date/Time   WBC 6.3 02/23/2024 0452   RBC 3.99 (L) 02/23/2024 0452   HGB 12.7 (L) 02/23/2024 0452   HGB 12.7 (L) 09/30/2023 1629   HCT 36.8 (L) 02/23/2024 0452   HCT 39.9 09/30/2023 1629   PLT 156 02/23/2024 0452   PLT 162 09/30/2023 1629   MCV 92.2 02/23/2024 0452   MCV 100 (H) 09/30/2023 1629   MCH 31.8 02/23/2024 0452   MCHC 34.5 02/23/2024 0452   RDW 15.0 02/23/2024 0452   RDW 13.7 09/30/2023 1629   LYMPHSABS 0.8 02/21/2024 0320   LYMPHSABS 1.1 09/30/2023 1629   MONOABS 0.4 02/21/2024 0320   EOSABS 0.2 02/21/2024 0320   EOSABS 0.1 09/30/2023 1629   BASOSABS 0.0 02/21/2024 0320   BASOSABS 0.0 09/30/2023 1629    BMET    Component Value Date/Time   NA 139 02/23/2024 0452   NA 141 09/30/2023 1629   K 3.1 (L) 02/23/2024 0452   CL 98 02/23/2024 0452   CO2 34 (H) 02/23/2024 0452   GLUCOSE 157 (H) 02/23/2024 0452   BUN 24 (H) 02/23/2024 0452   BUN 21 09/30/2023 1629   CREATININE 0.98 02/23/2024 0452   CALCIUM  8.6 (L) 02/23/2024 0452   EGFR 96 09/30/2023 1629   GFRNONAA >60 02/23/2024 0452    IMAGING past 24 hours DG CHEST PORT 1 VIEW Result Date: 02/23/2024 EXAM: 1 VIEW(S) XRAY OF THE CHEST 02/23/2024 08:05:51 AM COMPARISON: 02/21/2024 CLINICAL HISTORY: Hypoxemia FINDINGS: LINES, TUBES AND DEVICES: Left chest cardiac loop  recorder in place. LUNGS AND PLEURA: Ongoing low lung volumes with right hemidiaphragm elevation, not significantly changed. Regressed pulmonary vascularity. No overt edema. New hazy opacity over the periphery of the right lung base which may reflect a small pleural effusion, atelectasis, or airspace disease. No pneumothorax. HEART AND MEDIASTINUM: Stable mediastinal contours. Mild cardiomegaly. BONES AND SOFT TISSUES: No acute osseous abnormality. IMPRESSION: 1. New hazy opacity over the periphery of the right lung base, possibly representing a small pleural effusion, atelectasis, or airspace disease. 2. Ongoing low lung volumes with right hemidiaphragm elevation, not significantly changed. 3. Mild cardiomegaly. Electronically signed by: Waddell Calk MD 02/23/2024 08:13 AM EST RP Workstation: [id number]    Vitals:   02/23/24 0400 02/23/24 0500 02/23/24 0600 02/23/24 0800  BP: (!) 141/65 (!) 152/54 (!) 159/81 124/71  Pulse: (!) 55 (!) 51 (!) 55 (!) 55  Resp: 19 18 19 18   Temp: 98.1 F (36.7 C)   98 F (36.7 C)  TempSrc: Axillary   Axillary  SpO2: 99% (!) 89% 97% 92%  Weight:      Height:       PHYSICAL EXAM General:  Alert, well-nourished, well-developed patient in no acute distress CV: SR/SB on monitor Respiratory:  Regular, unlabored respirations on room air  NEURO:  Mental Status: Pt is awake, alert, eyes open, orientated to age, place, time, situation Speech/Language: No aphasia. Fluent language. Able to name and repeat.  Cranial  Nerves:  II: PERRL. Left hemianopia (chronic) III, IV, VI: EOMI.No gaze palsy, tracks examiner fully.  V: Sensation is intact to light touch and symmetrical to face.  VII: No significant facial droop VIII: hearing intact to voice. IX, X: Palate elevates symmetrically. No dysarthria.  KP:Dynloizm shrug 5/5. XII: tongue is midline  Motor:  RUE 5/5  LUE flexion contracture in the elbow and fingers, proximal 3-/5, distally 2/5 finger movement.  (Chronic) RLE 5/5  LLE 4+/5 proximal and 5/5 distally (chronic) Tone: is normal and bulk is normal Sensation- Intact to light touch bilaterally.  Coordination: R FTN intact, Unable to perform on left. (Chronic) Gait- deferred  Most Recent NIH 12    ASSESSMENT/PLAN  [name] is a 68 y.o. 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 CTH negative. Hypertensive and hyperglycemic in ED, admitted for further workup and ICU care.  NIH on Admission 11.  Stroke: left centrum semiovale infarct, etiology:  small vessel disease in patient with multiple uncontrolled risk factors Code Stroke CT head No acute intracranial abnormality. ASPECTS is 10. Old infarcts in the posterior right MCA territory and the right PCA territory.   CTA head & neck No large vessel occlusion. Severe left P2 PCA stenosis. MRI Small subacute infarct posterior left centrum semiovale. Extensive chronic ischemia with multiple old infarcts.  2D Echo: EF 50-55% EEG no seizure Loop Recorder Interrogation: to be completed tomorrow Patient states that he has received messages from doctor that LR was not working at times. LDL 101 HgbA1c 10.6 in 01/2024 UDS negative VTE prophylaxis - Heparin  SQ aspirin  81 mg daily prior to admission, continue aspirin  81 mg daily and clopidogrel  75 mg daily for 3 weeks and then Plavix   alone. Therapy recommendations: Home health PT Disposition:  pending  Hx of Stroke/TIA 02/2021 admitted for left-sided weakness.  CT showed right frontal HT/SAH.  CTA head and neck unremarkable.  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  MRI showed right frontal infarct with hemorrhagic transformation.  Repeat MRI later showed new right occipital infarct with enlargement of posterior frontal infarct, stable HT and SAH.  CTA head and neck  showed focal calcification within the right PCA distal P3 segment.  EF 45 to 50%, LDL 106, A1c is 11.1.  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  Discharged on aspirin  81 and Crestor . Outpatient 30-day cardiac event monitoring 05/2021 showed no A-fib. 04/2022 admitted for right MCA large infarct with hemorrhagic termination.  MRI showed left P2 severe stenosis.  EF 50 to 55%.  LDL 33, A1c 8.3.  Discharged on aspirin  and Topamax .  Loop recorder placed.  History of seizure August 2025 admitted for worsening left-sided weakness.  Found to have left facial twitching, left gaze and left arm jerking.  Received Versed  and loaded with Keppra .  EEG no seizure.  Strong Keppra  500 twice daily. Home meds: Keppra  500mg  BID, continued Chart review states patient not taking.  However, patient stated he was still taking his Keppra  at home  Hypertensive emergency Home meds:  Losartan  50mg  daily, Coreg  12.5mg  BID, Lasix  40 mg daily Stable now Off Cleviprex  On laxix, HCTZ, Cozaar  BP goal: BP <180/105 Long-term BP goal normotensive  Hyperlipidemia Home meds:  Crestor  40mg , resumed in hospital LDL 101, goal < 70 Add Zetia  10 Continue statin and Zetia  at discharge  Diabetes type II Uncontrolled Home meds: Trulicity   weekly, NovoLog  AC HgbA1c 10.6, goal < 7.0 On Lantus  CBGs SSI Recommend close follow-up with PCP for better DM control  Other Stroke Risk Factors Obesity, Body mass index is 31.69 kg/m., BMI >/= 30 associated with increased stroke risk, recommend weight loss, diet and exercise as appropriate  Obstructive sleep apnea, not on CPAP at home  Other medical issues Anxiety Status post ACDF Hypothyroidism on Synthroid  Leukocytosis, resolved  WBC 10.6-6.3  Hospital day # 2   Pt seen by Neuro NP/APP with MD. Note/plan to be edited by MD as needed.    Joseph JAYSON Likes, DNP [facility] Please use AMION for contact information & EPIC for messaging.   ATTENDING NOTE: I  reviewed above note and agree with the assessment and plan. Pt was seen and examined.   No family is at the bedside. Pt lying in bed, no complains. Overnight no acute event. BP under control. On DAPT, statin, zetia  and keppra . Loop recorder interrogation pending. PT and OT recommend HH. Will follow.  For detailed assessment and plan, please refer to above as I have made changes wherever appropriate.   Ary Cummins, MD PhD Stroke Neurology 02/23/2024 2:26 PM    To contact Stroke Continuity provider, please refer to [email or web address]. After hours, contact General Neurology

## 2024-02-23 NOTE — Progress Notes (Signed)
 Pt transferred to 3W without incident. Pt verified he had all belongings and CPAP machine for sleep. Chart handed to secretary on 3W, Vernell RN notified Pt arrived, purweick and O2 hooked up, CB in reach.

## 2024-02-23 NOTE — Assessment & Plan Note (Addendum)
 Transitioned off Cleviprex , resumed home losartan  hydrochlorothiazide . HTN improving but still above goal. - Cont home Losartan -hydrochlorothiazide  50-12.5 - Holding home Coreg  in setting of bradycardia - AM CBC, BMP, Mg

## 2024-02-23 NOTE — Assessment & Plan Note (Signed)
 MRI showed small subacute infarct of posterior left centrum semiovale.  No seizure activity ON. - On DAPT ASA 81 and plavix  75 x3wks, then plavix  alone - Continue home Keppra  - Neurology consulted, appreciate recs.

## 2024-02-23 NOTE — Assessment & Plan Note (Signed)
 TSH initially elevated to 11.215.  Likely in setting of acute illness. - Continue home Synthroid  100 mcg daily - Repeat TSH outpatient in 1 month

## 2024-02-23 NOTE — Progress Notes (Signed)
 [facility] ADULT ICU REPLACEMENT PROTOCOL   The patient does apply for the [facility] Adult ICU Electrolyte Replacment Protocol based on the criteria listed below:   1.Exclusion criteria: TCTS, ECMO, Dialysis, and Myasthenia Gravis patients 2. Is GFR >/= 30 ml/min? Yes.    Patient's GFR today is >60 3. Is SCr </= 2? Yes.   Patient's SCr is 0.98 mg/dL 4. Did SCr increase >/= 0.5 in 24 hours? No. 5.Pt's weight >40kg  Yes.   6. Abnormal electrolyte(s): K  7. Electrolytes replaced per protocol 8.  Call MD STAT for K+ </= 2.5, Phos </= 1, or Mag </= 1 Physician:  Haze Hunter BRAVO Lovena Kluck 02/23/2024 6:41 AM

## 2024-02-24 ENCOUNTER — Ambulatory Visit: Attending: Student

## 2024-02-24 DIAGNOSIS — I10 Essential (primary) hypertension: Secondary | ICD-10-CM | POA: Diagnosis not present

## 2024-02-24 DIAGNOSIS — E1165 Type 2 diabetes mellitus with hyperglycemia: Secondary | ICD-10-CM | POA: Diagnosis not present

## 2024-02-24 DIAGNOSIS — I5023 Acute on chronic systolic (congestive) heart failure: Secondary | ICD-10-CM | POA: Diagnosis not present

## 2024-02-24 DIAGNOSIS — I63411 Cerebral infarction due to embolism of right middle cerebral artery: Secondary | ICD-10-CM | POA: Diagnosis not present

## 2024-02-24 LAB — GLUCOSE, CAPILLARY
Glucose-Capillary: 275 mg/dL — ABNORMAL HIGH (ref 70–99)
Glucose-Capillary: 287 mg/dL — ABNORMAL HIGH (ref 70–99)
Glucose-Capillary: 223 mg/dL — ABNORMAL HIGH (ref 70–99)
Glucose-Capillary: 260 mg/dL — ABNORMAL HIGH (ref 70–99)

## 2024-02-24 LAB — BASIC METABOLIC PANEL WITH GFR
Anion gap: 5 (ref 5–15)
BUN: 23 mg/dL (ref 8–23)
CO2: 36 mmol/L — ABNORMAL HIGH (ref 22–32)
Calcium: 8.8 mg/dL — ABNORMAL LOW (ref 8.9–10.3)
Chloride: 98 mmol/L (ref 98–111)
Creatinine, Ser: 1.05 mg/dL (ref 0.61–1.24)
GFR, Estimated: >60 mL/min (ref >60)
Glucose, Bld: 198 mg/dL — ABNORMAL HIGH (ref 70–99)
Potassium: 3.7 mmol/L (ref 3.5–5.1)
Sodium: 139 mmol/L (ref 135–145)

## 2024-02-24 LAB — CBC
HCT: 41.4 % (ref 39.0–52.0)
Hemoglobin: 13.8 g/dL (ref 13.0–17.0)
MCH: 31.2 pg (ref 26.0–34.0)
MCHC: 33.3 g/dL (ref 30.0–36.0)
MCV: 93.7 fL (ref 80.0–100.0)
Platelets: 174 K/uL (ref 150–400)
RBC: 4.42 MIL/uL (ref 4.22–5.81)
RDW: 14.8 % (ref 11.5–15.5)
WBC: 6.5 K/uL (ref 4.0–10.5)
nRBC: 0 % (ref 0.0–0.2)

## 2024-02-24 LAB — MAGNESIUM: Magnesium: 1.8 mg/dL (ref 1.7–2.4)

## 2024-02-24 MED ORDER — POTASSIUM CHLORIDE CRYS ER 20 MEQ PO TBCR
40.0000 meq | EXTENDED_RELEASE_TABLET | Freq: Once | ORAL | Status: AC
  Administered 2024-02-24: 40 meq via ORAL
  Filled 2024-02-24: qty 2

## 2024-02-24 MED ORDER — SPIRONOLACTONE 25 MG PO TABS
25.0000 mg | ORAL_TABLET | Freq: Every day | ORAL | Status: DC
  Administered 2024-02-24: 25 mg via ORAL
  Filled 2024-02-24 (×2): qty 1

## 2024-02-24 MED ORDER — MAGNESIUM SULFATE IN D5W 1-5 GM/100ML-% IV SOLN
1.0000 g | Freq: Once | INTRAVENOUS | Status: AC
  Administered 2024-02-24: 1 g via INTRAVENOUS
  Filled 2024-02-24: qty 100

## 2024-02-24 MED ORDER — POTASSIUM CHLORIDE 20 MEQ PO PACK
40.0000 meq | PACK | Freq: Once | ORAL | Status: DC
  Filled 2024-02-24: qty 2

## 2024-02-24 MED ORDER — INSULIN GLARGINE 100 UNIT/ML ~~LOC~~ SOLN: 22.0000 [IU] | Freq: Two times a day (BID) | SUBCUTANEOUS | Status: DC

## 2024-02-24 MED ORDER — FUROSEMIDE 40 MG PO TABS
60.0000 mg | ORAL_TABLET | Freq: Two times a day (BID) | ORAL | Status: DC
  Administered 2024-02-24: 60 mg via ORAL
  Filled 2024-02-24 (×2): qty 1

## 2024-02-24 MED ORDER — POTASSIUM CHLORIDE 20 MEQ PO PACK: 40.0000 meq | PACK | Freq: Two times a day (BID) | ORAL | Status: DC

## 2024-02-24 MED ORDER — INSULIN GLARGINE 100 UNIT/ML ~~LOC~~ SOLN
24.0000 [IU] | Freq: Two times a day (BID) | SUBCUTANEOUS | Status: DC
  Administered 2024-02-24 – 2024-02-25 (×2): 24 [IU] via SUBCUTANEOUS
  Filled 2024-02-24 (×3): qty 0.24

## 2024-02-24 NOTE — Care Management Important Message (Signed)
 Important Message  Patient Details  Name: Joseph Ortega MRN: [id number] Date of Birth: 1955/12/27   Important Message Given:  Yes - Medicare IM     Claretta Deed 02/24/2024, 11:05 AM

## 2024-02-24 NOTE — Assessment & Plan Note (Signed)
 MRI showed small subacute infarct of posterior left centrum semiovale.  No seizure activity ON. - On DAPT ASA 81 and plavix  75 x3wks, then plavix  alone - Continue home Keppra  - Neurology consulted, appreciate recs.

## 2024-02-24 NOTE — Assessment & Plan Note (Addendum)
 Transitioned off Cleviprex , resumed home losartan  hydrochlorothiazide . HTN improving but still above goal. - Cont home Losartan -hydrochlorothiazide  50-12.5,  - Add Spironolactone  25 mg PO daily for HTN and GDMT - Holding home Coreg  in setting of bradycardia - AM EKG largely unchanged from 12/5 - AM CBC, BMP, Mg  - Mg >2 and K>4, replete as needed.

## 2024-02-24 NOTE — Plan of Care (Signed)
   Problem: Activity: Goal: Risk for activity intolerance will decrease Outcome: Progressing   Problem: Nutrition: Goal: Adequate nutrition will be maintained Outcome: Progressing   Problem: Pain Managment: Goal: General experience of comfort will improve and/or be controlled Outcome: Progressing

## 2024-02-24 NOTE — TOC CAGE-AID Note (Signed)
 Transition of Care [facility]) - CAGE-AID Screening   Patient Details  Name: Joseph Ortega MRN: [id number] Date of Birth: 1956/01/02  Transition of Care [facility]) CM/SW Contact:    Andrez JULIANNA George, RN Phone Number: 02/24/2024, 1:31 PM   Clinical Narrative:  Pt states he drinks a beer occasionally. He denied the need for counseling resources  CAGE-AID Screening:    Have You Ever Felt You Ought to Cut Down on Your Drinking or Drug Use?: No Have People Annoyed You By [employer] Your Drinking Or Drug Use?: No Have You Felt Bad Or Guilty About Your Drinking Or Drug Use?: No Have You Ever Had a Drink or Used Drugs First Thing In The Morning to Steady Your Nerves or to Get Rid of a Hangover?: No CAGE-AID Score: 0  Substance Abuse Education Offered: Yes (denied the need)

## 2024-02-24 NOTE — TOC Transition Note (Signed)
 Transition of Care [facility]) - Discharge Note   Patient Details  Name: Joseph Ortega MRN: [id number] Date of Birth: Jun 17, 1955  Transition of Care [facility]) CM/SW Contact:  Andrez JULIANNA George, RN Phone Number: 02/24/2024, 1:45 PM   Clinical Narrative:     Pt is discharging home and is moving to [place], [place]  on the 10th of December. He has no PCP but is planning to get into the [place]  [facility] his dad attends once there.  Current recommendations are for [facility] services. CM has left voicemail with [facility] and will provide orders to the patient for [facility]. Pt says he has a friend in [place]  that works with [facility] and will assist.  BSC orders provided to the patient per his request.  DME at home: quad cane/ wheelchair/ CPAP/ rollator Pt has transportation home.   Final next level of care: Home w Home Health Services Barriers to Discharge: No Barriers Identified   Patient Goals and CMS Choice   CMS Medicare.gov Compare Post Acute Care list provided to:: Patient Choice offered to / list presented to : Patient      Discharge Placement                       Discharge Plan and Services Additional resources added to the After Visit Summary for                  DME Arranged: Bedside commode       Representative spoke with at DME Agency: orders provided to the patient HH Arranged: PT, OT, Speech Therapy       Representative spoke with at [facility] Agency: orders provided to the patient.  Social Drivers of Health (SDOH) Interventions SDOH Screenings   Food Insecurity: No Food Insecurity (11/01/2023)  Housing: Low Risk  (11/01/2023)  Transportation Needs: Unmet Transportation Needs (11/01/2023)  Utilities: Not At Risk (11/01/2023)  Alcohol Screen: Low Risk  (09/19/2023)  Depression (PHQ2-9): Low Risk  (04/24/2023)  Financial Resource Strain: Low Risk  (09/19/2023)  Social Connections: Moderately Isolated (11/01/2023)  Tobacco Use: Low Risk  (02/21/2024)     Readmission Risk Interventions     09/16/2023   10:59 AM  Readmission Risk Prevention Plan  Post Dischage Appt Complete  Medication Screening Complete  Transportation Screening Complete

## 2024-02-24 NOTE — Progress Notes (Signed)
 Physical Therapy Treatment Patient Details Name: Joseph Ortega MRN: [id number] DOB: 12-13-1955 Today's Date: 02/24/2024   History of Present Illness 68 yo M adm 12/5 L side weakness and erratic behavior. 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    PT Comments  Patient eager to work with PT. Hopes to discharge home tomorrow as is scheduled to move to [place]  12/10 to be closer to children. Patient reports he will need medical transport home to get into house because it is a long walk from car to inside. Reports he has done this previously. Will notify TOC of his anticipated plan. Patient performs bed mobility with incr time and supervision. Transfers and ambulates with RW with CGA x 50 ft. Reports he cannot walk farther due to fatigue of LLE. Session required incr time due to switching telemetry to portable monitor and pt using BSC (non-billable time).     If plan is discharge home, recommend the following: Assistance with cooking/housework;Direct supervision/assist for medications management;Direct supervision/assist for financial management;Assist for transportation;Help with stairs or ramp for entrance;Supervision due to cognitive status   Can travel by private vehicle        Equipment Recommendations  None recommended by PT    Recommendations for Other Services       Precautions / Restrictions Precautions Precautions: Fall     Mobility  Bed Mobility Overal bed mobility: Needs Assistance Bed Mobility: Supine to Sit, Sit to Supine     Supine to sit: HOB elevated, Used rails, Supervision Sit to supine: HOB elevated, Used rails, Modified independent (Device/Increase time)   General bed mobility comments: pt uses rails to exit bed to his rt (at home he goes to his left)     Transfers Overall transfer level: Needs assistance Equipment used: Rolling walker (2 wheels) Transfers: Sit to/from Stand Sit to Stand: From elevated surface, Contact guard assist           General transfer comment: from elevated bed (simulating home) and from [facility]    Ambulation/Gait Ambulation/Gait assistance: Contact guard assist Gait Distance (Feet): 50 Feet Assistive device: Rolling walker (2 wheels) Gait Pattern/deviations: Step-to pattern, Decreased step length - left, Decreased stance time - left, Decreased weight shift to left   Gait velocity interpretation: <1.31 ft/sec, indicative of household ambulator   General Gait Details: able to grip RW with Lt hand much easier than on eval; no walker grip needed   Stairs             Wheelchair Mobility     Tilt Bed    Modified Rankin (Stroke Patients Only) Modified Rankin (Stroke Patients Only) Pre-Morbid Rankin Score: Moderate disability Modified Rankin: Moderate disability     Balance Overall balance assessment: Mild deficits observed, not formally tested                                          Communication Communication Communication: No apparent difficulties  Cognition Arousal: Alert Behavior During Therapy: WFL for tasks assessed/performed   PT - Cognitive impairments: No family/caregiver present to determine baseline                         Following commands: Impaired  Following commands impaired: Follows one step commands with increased time, Follows multi-step commands inconsistently    Cueing Cueing Techniques: Verbal cues, Gestural cues, Tactile cues  Exercises      General Comments General comments (skin integrity, edema, etc.): incr time getting pt switched to portable telemetry box and for pt to use restroom      Pertinent Vitals/Pain Pain Assessment Pain Assessment: No/denies pain Faces Pain Scale: No hurt    Home Living                           Prior Function            PT Goals (current goals can now be found in the care plan section) Acute Rehab PT Goals Patient Stated Goal: return to his new home this week Time For Goal Achievement: 03/07/24 Potential to Achieve Goals: Good Progress towards PT goals: Progressing toward goals    Frequency    Min 3X/week      PT Plan      Co-evaluation              AM-PAC PT 6 Clicks Mobility   Outcome Measure  Help needed turning from your back to your side while in a flat bed without using bedrails?: A Little Help needed moving from lying on your back to sitting on the side of a flat bed without using bedrails?: A Little Help needed moving to and from a bed to a chair (including a wheelchair)?: A Little Help needed standing up from a chair using your arms (e.g., wheelchair or bedside chair)?: A Little Help needed to walk in hospital room?: A Little Help needed climbing 3-5 steps with a railing? : Total 6 Click Score: 16    End of Session Equipment Utilized During Treatment: Gait belt Activity Tolerance: Patient tolerated treatment well Patient left: in bed;with call bell/phone within reach;with bed alarm set Nurse Communication: Other (comment) (?part of bandage fell out of sacral dressing; emptied full canister of urine) PT Visit Diagnosis: Unsteadiness on feet (R26.81);Other abnormalities of gait and mobility (R26.89);Muscle weakness (generalized) (M62.81);Other symptoms and signs involving the nervous system (R29.898);Hemiplegia and hemiparesis Hemiplegia - Right/Left: Left Hemiplegia - dominant/non-dominant: Non-dominant Hemiplegia - caused by: Cerebral infarction     Time: 8493-8454 PT Time Calculation (min) (ACUTE ONLY): 39 min  Charges:    $Gait Training: 23-37 mins PT General Charges $$ ACUTE PT VISIT: 1 Visit                      Macario RAMAN, PT Acute Rehabilitation Services  Office [phone]    Macario SHAUNNA Soja 02/24/2024, 4:00  PM

## 2024-02-24 NOTE — Progress Notes (Signed)
 Heart Failure Navigator Progress Note  Assessed for Heart & Vascular TOC clinic readiness.  Patient does not meet criteria due to EF 50-55%, per patient he is moving to [place] . No HF TOC. .   Navigator will sign off at this time.   Stephane Haddock, BSN, Scientist, Clinical (histocompatibility And Immunogenetics) Only

## 2024-02-24 NOTE — Assessment & Plan Note (Addendum)
 Diuresing w/ Lasix  60BID IV, w/ good UOP. Cr up trended slightly today.  Will continue to monitor tomorrow and adjust regimen as needed - Daily weight ordered, pending a.m. weight - DC Lasix  60 BID IV. Start Lasix  60 mg PO BID for today.  - I's and O's, 2250 urine output, reportedly -[id number]

## 2024-02-24 NOTE — Plan of Care (Signed)
 Problem: Education: Goal: Ability to describe self-care measures that may prevent or decrease complications (Diabetes Survival Skills Education) will improve 02/24/2024 0442 by Jori Roderic CROME, RN Outcome: Progressing 02/24/2024 0441 by Jori Roderic CROME, RN Outcome: Progressing Goal: Individualized Educational Video(s) 02/24/2024 0442 by Jori Roderic CROME, RN Outcome: Progressing 02/24/2024 0441 by Jori Roderic CROME, RN Outcome: Progressing   Problem: Coping: Goal: Ability to adjust to condition or change in health will improve 02/24/2024 0442 by Jori Roderic CROME, RN Outcome: Progressing 02/24/2024 0441 by Jori Roderic CROME, RN Outcome: Progressing   Problem: Fluid Volume: Goal: Ability to maintain a balanced intake and output will improve 02/24/2024 0442 by Jori Roderic CROME, RN Outcome: Progressing 02/24/2024 0441 by Jori Roderic CROME, RN Outcome: Progressing   Problem: Health Behavior/Discharge Planning: Goal: Ability to identify and utilize available resources and services will improve 02/24/2024 0442 by Jori Roderic CROME, RN Outcome: Progressing 02/24/2024 0441 by Jori Roderic CROME, RN Outcome: Progressing Goal: Ability to manage health-related needs will improve 02/24/2024 0442 by Jori Roderic CROME, RN Outcome: Progressing 02/24/2024 0441 by Jori Roderic CROME, RN Outcome: Progressing   Problem: Metabolic: Goal: Ability to maintain appropriate glucose levels will improve 02/24/2024 0442 by Jori Roderic CROME, RN Outcome: Progressing 02/24/2024 0441 by Jori Roderic CROME, RN Outcome: Progressing   Problem: Nutritional: Goal: Maintenance of adequate nutrition will improve 02/24/2024 0442 by Jori Roderic CROME, RN Outcome: Progressing 02/24/2024 0441 by Jori Roderic CROME, RN Outcome: Progressing Goal: Progress toward achieving an optimal weight will improve 02/24/2024 0442 by Jori Roderic CROME, RN Outcome:  Progressing 02/24/2024 0441 by Jori Roderic CROME, RN Outcome: Progressing   Problem: Skin Integrity: Goal: Risk for impaired skin integrity will decrease 02/24/2024 0442 by Jori Roderic CROME, RN Outcome: Progressing 02/24/2024 0441 by Jori Roderic CROME, RN Outcome: Progressing   Problem: Tissue Perfusion: Goal: Adequacy of tissue perfusion will improve 02/24/2024 0442 by Jori Roderic CROME, RN Outcome: Progressing 02/24/2024 0441 by Jori Roderic CROME, RN Outcome: Progressing   Problem: Education: Goal: Knowledge of General Education information will improve Description: Including pain rating scale, medication(s)/side effects and non-pharmacologic comfort measures 02/24/2024 0442 by Jori Roderic CROME, RN Outcome: Progressing 02/24/2024 0441 by Jori Roderic CROME, RN Outcome: Progressing   Problem: Health Behavior/Discharge Planning: Goal: Ability to manage health-related needs will improve 02/24/2024 0442 by Jori Roderic CROME, RN Outcome: Progressing 02/24/2024 0441 by Jori Roderic CROME, RN Outcome: Progressing   Problem: Clinical Measurements: Goal: Ability to maintain clinical measurements within normal limits will improve 02/24/2024 0442 by Jori Roderic CROME, RN Outcome: Progressing 02/24/2024 0441 by Jori Roderic CROME, RN Outcome: Progressing Goal: Will remain free from infection 02/24/2024 0442 by Jori Roderic CROME, RN Outcome: Progressing 02/24/2024 0441 by Jori Roderic CROME, RN Outcome: Progressing Goal: Diagnostic test results will improve 02/24/2024 0442 by Jori Roderic CROME, RN Outcome: Progressing 02/24/2024 0441 by Jori Roderic CROME, RN Outcome: Progressing Goal: Respiratory complications will improve 02/24/2024 0442 by Jori Roderic CROME, RN Outcome: Progressing 02/24/2024 0441 by Jori Roderic CROME, RN Outcome: Progressing Goal: Cardiovascular complication will be avoided 02/24/2024 0442 by Jori Roderic CROME,  RN Outcome: Progressing 02/24/2024 0441 by Jori Roderic CROME, RN Outcome: Progressing   Problem: Activity: Goal: Risk for activity intolerance will decrease 02/24/2024 0442 by Jori Roderic CROME, RN Outcome: Progressing 02/24/2024 0441 by Jori Roderic CROME, RN Outcome: Progressing   Problem: Nutrition: Goal: Adequate nutrition will be maintained 02/24/2024 0442 by Jori Roderic CROME, RN Outcome: Progressing 02/24/2024 0441 by Jori Roderic CROME, RN Outcome: Progressing  Problem: Coping: Goal: Level of anxiety will decrease 02/24/2024 0442 by Jori Roderic CROME, RN Outcome: Progressing 02/24/2024 0441 by Jori Roderic CROME, RN Outcome: Progressing   Problem: Elimination: Goal: Will not experience complications related to bowel motility 02/24/2024 0442 by Jori Roderic CROME, RN Outcome: Progressing 02/24/2024 0441 by Jori Roderic CROME, RN Outcome: Progressing Goal: Will not experience complications related to urinary retention 02/24/2024 0442 by Jori Roderic CROME, RN Outcome: Progressing 02/24/2024 0441 by Jori Roderic CROME, RN Outcome: Progressing   Problem: Pain Managment: Goal: General experience of comfort will improve and/or be controlled 02/24/2024 0442 by Jori Roderic CROME, RN Outcome: Progressing 02/24/2024 0441 by Jori Roderic CROME, RN Outcome: Progressing   Problem: Safety: Goal: Ability to remain free from injury will improve 02/24/2024 0442 by Jori Roderic CROME, RN Outcome: Progressing 02/24/2024 0441 by Jori Roderic CROME, RN Outcome: Progressing   Problem: Skin Integrity: Goal: Risk for impaired skin integrity will decrease 02/24/2024 0442 by Jori Roderic CROME, RN Outcome: Progressing 02/24/2024 0441 by Jori Roderic CROME, RN Outcome: Progressing   Problem: Education: Goal: Knowledge of disease or condition will improve 02/24/2024 0442 by Jori Roderic CROME, RN Outcome: Progressing 02/24/2024 0441 by Jori Roderic CROME, RN Outcome: Progressing Goal: Knowledge of secondary prevention will improve (MUST DOCUMENT ALL) 02/24/2024 0442 by Jori Roderic CROME, RN Outcome: Progressing 02/24/2024 0441 by Jori Roderic CROME, RN Outcome: Progressing Goal: Knowledge of patient specific risk factors will improve (DELETE if not current risk factor) 02/24/2024 0442 by Jori Roderic CROME, RN Outcome: Progressing 02/24/2024 0441 by Jori Roderic CROME, RN Outcome: Progressing   Problem: Ischemic Stroke/TIA Tissue Perfusion: Goal: Complications of ischemic stroke/TIA will be minimized 02/24/2024 0442 by Jori Roderic CROME, RN Outcome: Progressing 02/24/2024 0441 by Jori Roderic CROME, RN Outcome: Progressing   Problem: Coping: Goal: Will verbalize positive feelings about self 02/24/2024 0442 by Jori Roderic CROME, RN Outcome: Progressing 02/24/2024 0441 by Jori Roderic CROME, RN Outcome: Progressing Goal: Will identify appropriate support needs 02/24/2024 0442 by Jori Roderic CROME, RN Outcome: Progressing 02/24/2024 0441 by Jori Roderic CROME, RN Outcome: Progressing   Problem: Health Behavior/Discharge Planning: Goal: Ability to manage health-related needs will improve 02/24/2024 0442 by Jori Roderic CROME, RN Outcome: Progressing 02/24/2024 0441 by Jori Roderic CROME, RN Outcome: Progressing Goal: Goals will be collaboratively established with patient/family 02/24/2024 0442 by Jori Roderic CROME, RN Outcome: Progressing 02/24/2024 0441 by Jori Roderic CROME, RN Outcome: Progressing   Problem: Self-Care: Goal: Ability to participate in self-care as condition permits will improve 02/24/2024 0442 by Jori Roderic CROME, RN Outcome: Progressing 02/24/2024 0441 by Jori Roderic CROME, RN Outcome: Progressing Goal: Verbalization of feelings and concerns over difficulty with self-care will improve 02/24/2024 0442 by Jori Roderic CROME, RN Outcome: Progressing 02/24/2024 0441  by Jori Roderic CROME, RN Outcome: Progressing Goal: Ability to communicate needs accurately will improve 02/24/2024 0442 by Jori Roderic CROME, RN Outcome: Progressing 02/24/2024 0441 by Jori Roderic CROME, RN Outcome: Progressing   Problem: Nutrition: Goal: Risk of aspiration will decrease 02/24/2024 0442 by Jori Roderic CROME, RN Outcome: Progressing 02/24/2024 0441 by Jori Roderic CROME, RN Outcome: Progressing Goal: Dietary intake will improve 02/24/2024 0442 by Jori Roderic CROME, RN Outcome: Progressing 02/24/2024 0441 by Jori Roderic CROME, RN Outcome: Progressing

## 2024-02-24 NOTE — Care Management Important Message (Signed)
 Important Message  Patient Details  Name: Joseph Ortega MRN: [id number] Date of Birth: 04-Mar-1956   Important Message Given:  Yes - Medicare IM     Claretta Deed 02/24/2024, 4:25 PM

## 2024-02-24 NOTE — Inpatient Diabetes Management (Signed)
 Inpatient Diabetes Program Recommendations  AACE/ADA: New Consensus Statement on Inpatient Glycemic Control (2015)  Target Ranges:  Prepandial:   less than 140 mg/dL      Peak postprandial:   less than 180 mg/dL (1-2 hours)      Critically ill patients:  140 - 180 mg/dL   Lab Results  Component Value Date   GLUCAP 287 (H) 02/24/2024   HGBA1C 10.6 (A) 02/03/2024    Review of Glycemic Control  Latest Reference Range & Units 02/23/24 07:28 02/23/24 11:01 02/23/24 16:45 02/23/24 21:40 02/24/24 08:18 02/24/24 11:29  Glucose-Capillary 70 - 99 mg/dL 774 (H) 770 (H) 781 (H) 240 (H) 275 (H) 287 (H)   Diabetes history: DM 2 Outpatient Diabetes medications:  Trulicity  0.75 mg weekly-just restarted Lantus  10 units daily Current orders for Inpatient glycemic control:  Novolog  0-15 units tid with meals HS Lantus  24 units bid  Inpatient Diabetes Program Recommendations:    Spoke briefly with patient regarding elevated A1C of 10.6%.  It appears that he was just restarted on Lantus  insulin  in November of 2025.  He states that his dose was 40 units daily?? However orders from 11/17 notes that he was restarted on Lantus  10 units daily.  We briefly discussed the importance of blood sugar management and taking insulin  as ordered.  He states he was supposed to follow-up but never did.   He states he plans to move to [place]  after discharge from hospital and will get new doctors there.  He states he has plenty of insulin , meter and strips at home.  Encouraged him to follow-up as soon as possible.   He verbalized understanding. Also explained that D/C summary will specify doses for insulin  at home as well.   Thanks,  Randall Bullocks, RN, BC-ADM Inpatient Diabetes Coordinator Pager [phone]  (8a-5p)

## 2024-02-24 NOTE — Assessment & Plan Note (Addendum)
 BG 200-240s, creased glargine to 20u BID last night. Will CTM glucose curve, - Increase lantus  24u BID from 20 units BID - Cont SSI, received 12 units sliding scale

## 2024-02-24 NOTE — Evaluation (Signed)
 Occupational Therapy Evaluation Patient Details Name: Joseph Ortega MRN: [id number] DOB: 08/27/1955 Today's Date: 02/24/2024   History of Present Illness   68 yo M adm 12/5 L side weakness and erratic behavior. 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     Clinical Impressions PTA, pt living with wife who uses a wheelchair at baseline, but assists him with his LB ADL as needed. Upon eval, pt needing up to min A for transfers, mod A for LB ADL. Pt pleasant and conversational throughout session, reporting he does not rise from lower surfaces at home. Brought L saddle walker splint to optimize L grasp on RW per PT note, but pt not comfortable with it. Pt with decreased balance, strength, greater LUE tightness than baseline. Will continue to follow. Pt to continue to benefit from acute OT services. Recommending discharge home with HHOT and support from family. Plan is to move to [facility] in a few days to be with children; have reached out to CM to set up services accordingly.       If plan is discharge home, recommend the following:   A little help with walking and/or transfers;A little help with bathing/dressing/bathroom;Assistance with cooking/housework;Assist for transportation;Help with stairs or ramp for entrance;Direct supervision/assist for financial management     Functional Status Assessment   Patient has had a recent decline in their functional status and demonstrates the ability to make significant improvements in function in a reasonable and predictable amount of time.     Equipment Recommendations   BSC/3in1     Recommendations for Other Services         Precautions/Restrictions   Precautions Precautions: Fall Recall of Precautions/Restrictions: Impaired      Mobility Bed Mobility Overal bed mobility: Needs Assistance Bed Mobility: Supine to Sit, Sit to Supine     Supine to sit: Contact guard, HOB elevated, Used rails Sit to supine: Supervision, HOB elevated, Used rails        Transfers Overall transfer level: Needs assistance Equipment used: Rolling walker (2 wheels) Transfers: Sit to/from Stand Sit to Stand: Min assist, From elevated surface           General transfer comment: heavy min A up from EOB; light assist up from EOB      Balance Overall balance assessment: Mild deficits observed, not formally tested                                         ADL either performed or assessed with clinical judgement   ADL Overall ADL's : Needs assistance/impaired Eating/Feeding: Set up;Sitting   Grooming: Contact guard assist;Standing   Upper Body Bathing: Set up;Sitting   Lower Body Bathing: Moderate assistance;Sit to/from stand   Upper Body Dressing : Set up;Sitting   Lower Body Dressing: Moderate assistance;Sit to/from stand   Toilet Transfer: Minimal assistance;Contact guard assist;Ambulation;BSC/3in1 Statistician Details (indicate cue type and reason): min A for rise during transfer         Functional mobility during ADLs: Rolling walker (2 wheels);Contact guard assist       Vision Baseline Vision/History:  (peripheral visual impairment on the L at baseline s/p CVA 2 years ago) Ability to See in Adequate Light: 2 Moderately  impaired Patient Visual Report: No change from baseline Vision Assessment?: Wears glasses for reading Additional Comments: pt has L field cut at baseline     Perception         Praxis         Pertinent Vitals/Pain Pain Assessment Pain Assessment: Faces Faces Pain Scale: No hurt     Extremity/Trunk Assessment Upper Extremity Assessment Upper Extremity Assessment: LUE deficits/detail LUE Deficits / Details: chronic L hemi, pt reports more tightness in  digits/hands than baseline, typically able to use as a helper hand. thumbg with flexion contracture   Lower Extremity Assessment Lower Extremity Assessment: Defer to PT evaluation   Cervical / Trunk Assessment Cervical / Trunk Assessment: Other exceptions Cervical / Trunk Exceptions: overweight   Communication Communication Communication: No apparent difficulties   Cognition Arousal: Alert Behavior During Therapy: WFL for tasks assessed/performed, Impulsive (very mild impulsivity, but responde well to cues) Cognition: No family/caregiver present to determine baseline, Cognition impaired       Memory impairment (select all impairments): Short-term memory (but pt reports this is baseline.)                       Following commands: Impaired Following commands impaired: Follows one step commands with increased time, Follows multi-step commands inconsistently     Cueing  General Comments   Cueing Techniques: Verbal cues;Gestural cues;Tactile cues  VSS   Exercises     Shoulder Instructions      Home Living Family/patient expects to be discharged to:: Private residence Living Arrangements: Spouse/significant other Available Help at Discharge: Family;Available 24 hours/day Type of Home: House Home Access: Ramped entrance;Stairs to enter Entrance Stairs-Number of Steps: 2 steps in/out of the man cave with grab bar Entrance Stairs-Rails: Right Home Layout: Two level;Laundry or work area in basement     Sungard: Producer, Television/film/video: Handicapped height Bathroom Accessibility: Yes How Accessible: Accessible via wheelchair;Accessible via walker Home Equipment: Shower seat;Grab bars - tub/shower;Grab bars - toilet;Rolling Environmental Consultant (2 wheels);Rollator (4 wheels)   Additional Comments: pt reports above info for his new house that they are moving into this week  Lives With: Spouse    Prior Functioning/Environment Prior Level of Function :  Needs assist             Mobility Comments: reports he does bed mobility, transfers and ambulation with RW independently ADLs Comments: wife assists with socks, but otherwise dresses himself, does his own medication management, wife does finances, and they pay someone to provide transportation as well as assist with grocery shopping    OT Problem List: Decreased strength;Decreased activity tolerance;Decreased range of motion;Impaired balance (sitting and/or standing);Impaired vision/perception;Decreased coordination;Decreased cognition;Decreased knowledge of use of DME or AE;Decreased knowledge of precautions;Impaired UE functional use   OT Treatment/Interventions: Self-care/ADL training;Therapeutic exercise;DME and/or AE instruction;Therapeutic activities;Balance training;Patient/family education;Visual/perceptual remediation/compensation;Cognitive remediation/compensation      OT Goals(Current goals can be found in the care plan section)   Acute Rehab OT Goals Patient Stated Goal: get better OT Goal Formulation: With patient Time For Goal Achievement: 03/09/24 Potential to Achieve Goals: Good   OT Frequency:  Min 2X/week    Co-evaluation              AM-PAC OT 6 Clicks Daily Activity     Outcome Measure Help from another person eating meals?: None Help from another person taking care of personal grooming?: A Little Help from another person toileting, which includes using toliet, bedpan, or  urinal?: A Little Help from another person bathing (including washing, rinsing, drying)?: A Little Help from another person to put on and taking off regular upper body clothing?: A Little Help from another person to put on and taking off regular lower body clothing?: A Lot 6 Click Score: 18   End of Session Equipment Utilized During Treatment: Gait belt;Rolling walker (2 wheels) Nurse Communication: Mobility status  Activity Tolerance: Patient tolerated treatment well Patient  left: in bed;with call bell/phone within reach;with bed alarm set  OT Visit Diagnosis: Unsteadiness on feet (R26.81);Muscle weakness (generalized) (M62.81);History of falling (Z91.81);Low vision, both eyes (H54.2);Other symptoms and signs involving cognitive function;Hemiplegia and hemiparesis (1 fall PTA) Hemiplegia - Right/Left: Left Hemiplegia - dominant/non-dominant:  (chronic L hemi) Hemiplegia - caused by: Cerebral infarction                Time: 9184-9151 OT Time Calculation (min): 33 min Charges:  OT General Charges $OT Visit: 1 Visit OT Evaluation $OT Eval Moderate Complexity: 1 Mod OT Treatments $Self Care/Home Management : 8-22 mins  [name] FREDERICK, OTR/L [facility] Acute Rehabilitation Office: [phone]   [name] 02/24/2024, 9:42 AM

## 2024-02-24 NOTE — Progress Notes (Signed)
 Daily Progress Note Intern Pager: [phone]  Patient name: Joseph Ortega Medical record number: [id number] Date of birth: [birth year] Age: 67 y.o. Gender: male  Primary Care Provider: Howell Lunger, DO Consultants: Neurology Code Status: Full   Pt Overview and Major Events to Date:  12/5: Admitted to ICU 12/7: Transferred to [facility] TS med/tele   Assessment and Plan: [name] is a 68 year old M with history of prior CVA with residual left hemiparesis, T2DM, seizures on Keppra  was admitted for seizure and uncontrolled hypertension.  Patient was initially admitted to ICU for IV cleviprex , HTN improved, and transferred now to [facility] Med Tele.  Some bradycardia overnight with intermittently moderate hypertensive periods, asymptomatic. Assessment & Plan Hypertension Transitioned off Cleviprex , resumed home losartan  hydrochlorothiazide . HTN improving but still above goal. - Cont home Losartan -hydrochlorothiazide  50-12.5,  - Add Spironolactone  25 mg PO daily for HTN and GDMT - Holding home Coreg  in setting of bradycardia - AM EKG largely unchanged from 12/5 - AM CBC, BMP, Mg  - Mg >2 and K>4, replete as needed. Seizure disorder (HCC) CVA (cerebral vascular accident) (HCC) MRI showed small subacute infarct of posterior left centrum semiovale.  No seizure activity ON. - On DAPT ASA 81 and plavix  75 x3wks, then plavix  alone - Continue home Keppra  - Neurology consulted, appreciate recs. CHF exacerbation (HCC) Diuresing w/ Lasix  60BID IV, w/ good UOP. Cr up trended slightly today.  Will continue to monitor tomorrow and adjust regimen as needed - Daily weight ordered, pending a.m. weight - DC Lasix  60 BID IV. Start Lasix  60 mg PO BID for today.  - I's and O's, 2250 urine output, reportedly -1904 T2DM (type 2 diabetes mellitus) (HCC) BG 200-240s, creased glargine to 20u BID last night. Will CTM glucose curve, - Increase lantus  24u BID from 20 units BID - Cont SSI, received 12 units sliding  scale Hypothyroidism TSH initially elevated to 11.215.  Likely in setting of acute illness. - Continue home Synthroid  100 mcg daily - Repeat TSH outpatient in 1 month   FEN/GI: Heart health, carb modified PPx: SQ heparin  Dispo: Pending clinical improvement.   Subjective:  Persistent sinus bradycardia overnight, intermittently hypertensive  Objective: Temp:  [98.1 F (36.7 C)-98.2 F (36.8 C)] 98.2 F (36.8 C) (12/08 0440) Pulse Rate:  [49-61] 53 (12/08 0440) Resp:  [11-21] 18 (12/08 0440) BP: (133-178)/(63-84) 147/72 (12/08 0440) SpO2:  [93 %-100 %] 100 % (12/07 1854) Physical Exam: General: Well-appearing, no acute distress, resting comfortably Cardiovascular: Regular rate rhythm, no murmurs rubs or gallops Respiratory: Clear to auscultation bilaterally, no wheezes or crackles Abdomen: Active bowel sounds, nondistended, nontender Extremities: Trace 1+ pitting edema bilateral ankles, nontender bilaterally  Laboratory: Most recent CBC Lab Results  Component Value Date   WBC 6.5 02/24/2024   HGB 13.8 02/24/2024   HCT 41.4 02/24/2024   MCV 93.7 02/24/2024   PLT 174 02/24/2024   Most recent BMP    Latest Ref Rng & Units 02/24/2024    3:48 AM  BMP  Glucose 70 - 99 mg/dL 801   BUN 8 - 23 mg/dL 23   Creatinine 9.38 - 1.24 mg/dL 8.94   Sodium 864 - 854 mmol/L 139   Potassium 3.5 - 5.1 mmol/L 3.7   Chloride 98 - 111 mmol/L 98   CO2 22 - 32 mmol/L 36   Calcium  8.9 - 10.3 mg/dL 8.8     Other pertinent labs magnesium  1.8, CBGs 218-240-198 overnight  Imaging/Diagnostic Tests: EKG: Sinus bradycardia with first-degree AV block,  RBBB, LAD unchanged from previous EKG  Lorrane Pac, MD 02/24/2024, 8:00 AM  PGY-1, [facility] FPTS Intern pager: [phone], text pages welcome Secure chat group [facility] [place] Teaching Service

## 2024-02-24 NOTE — Plan of Care (Signed)

## 2024-02-24 NOTE — Progress Notes (Signed)
 STROKE TEAM PROGRESS NOTE    SIGNIFICANT HOSPITAL EVENTS  12/5: Presented w/worsening left-sided deficits (residual from previous cva), Hypertensive emergency, Hypoxemia and Hyperglycemia requiring insulin  infusion  MRI shows incidental left MCA territory infarct, no correlation to presentation symptoms. Encephalopathy/recrudescence of prior left-sided weakness etiology of presenting symptoms likely.   INTERIM HISTORY/SUBJECTIVE Sitting up in bed. Left hemianopia and left-sided weakness present, patient states this is at his baseline level.  Loop recorder with no afib   OBJECTIVE  CBC    Component Value Date/Time   WBC 6.5 02/24/2024 0348   RBC 4.42 02/24/2024 0348   HGB 13.8 02/24/2024 0348   HGB 12.7 (L) 09/30/2023 1629   HCT 41.4 02/24/2024 0348   HCT 39.9 09/30/2023 1629   PLT 174 02/24/2024 0348   PLT 162 09/30/2023 1629   MCV 93.7 02/24/2024 0348   MCV 100 (H) 09/30/2023 1629   MCH 31.2 02/24/2024 0348   MCHC 33.3 02/24/2024 0348   RDW 14.8 02/24/2024 0348   RDW 13.7 09/30/2023 1629   LYMPHSABS 0.8 02/21/2024 0320   LYMPHSABS 1.1 09/30/2023 1629   MONOABS 0.4 02/21/2024 0320   EOSABS 0.2 02/21/2024 0320   EOSABS 0.1 09/30/2023 1629   BASOSABS 0.0 02/21/2024 0320   BASOSABS 0.0 09/30/2023 1629    BMET    Component Value Date/Time   NA 139 02/24/2024 0348   NA 141 09/30/2023 1629   K 3.7 02/24/2024 0348   CL 98 02/24/2024 0348   CO2 36 (H) 02/24/2024 0348   GLUCOSE 198 (H) 02/24/2024 0348   BUN 23 02/24/2024 0348   BUN 21 09/30/2023 1629   CREATININE 1.05 02/24/2024 0348   CALCIUM  8.8 (L) 02/24/2024 0348   EGFR 96 09/30/2023 1629   GFRNONAA >60 02/24/2024 0348    IMAGING past 24 hours No results found.   Vitals:   02/23/24 2000 02/23/24 2301 02/24/24 0440 02/24/24 0818  BP: (!) 176/84  (!) 147/72 (!) 147/70  Pulse: (!) 57  (!) 53 66  Resp:  18 18 19   Temp: 98.1 F (36.7 C)  98.2 F (36.8 C) 98.2 F (36.8 C)  TempSrc: Oral  Oral Oral  SpO2:     93%  Weight:      Height:       PHYSICAL EXAM General:  Alert, well-nourished, well-developed patient in no acute distress CV: SR/SB on monitor Respiratory:  Regular, unlabored respirations on room air  NEURO:  Mental Status: Pt is awake, alert, eyes open, orientated to age, place, time, situation Speech/Language: No aphasia. Fluent language. Able to name and repeat.  Cranial Nerves:  II: PERRL. Left hemianopia (chronic) III, IV, VI: EOMI.No gaze palsy, tracks examiner fully.  V: Sensation is intact to light touch and symmetrical to face.  VII: No significant facial droop VIII: hearing intact to voice. IX, X: Palate elevates symmetrically. No dysarthria.  KP:Dynloizm shrug 5/5. XII: tongue is midline  Motor:  RUE 5/5  LUE flexion contracture in the elbow and fingers, proximal 3-/5, distally 2/5 finger movement. (Chronic) RLE 5/5  LLE 4+/5 proximal and 5/5 distally (chronic) Tone: is normal and bulk is normal Sensation- Intact to light touch bilaterally.  Coordination: R FTN intact, Unable to perform on left. (Chronic) Gait- deferred  Most Recent NIH 12    ASSESSMENT/PLAN  [name] is a 68 y.o. 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 CTH negative.  Hypertensive and hyperglycemic in ED, admitted for further workup and ICU care.  NIH on Admission 11.  Stroke: left centrum semiovale infarct, etiology:  small vessel disease in patient with multiple uncontrolled risk factors Code Stroke CT head No acute intracranial abnormality. ASPECTS is 10. Old infarcts in the posterior right MCA territory and the right PCA territory.   CTA head & neck No large vessel occlusion. Severe left P2 PCA stenosis. MRI Small subacute infarct posterior left centrum semiovale. Extensive chronic ischemia with multiple old infarcts.  2D Echo: EF 50-55% EEG no seizure Loop  Recorder Interrogation: NSR, no atrial fibrillation  Patient states that he has received messages from doctor that LR was not working at times. LDL 101 HgbA1c 10.6 in 01/2024 UDS negative VTE prophylaxis - Heparin  SQ aspirin  81 mg daily prior to admission, continue aspirin  81 mg daily and clopidogrel  75 mg daily for 3 weeks and then Plavix   alone. Therapy recommendations: Home health PT Disposition:  pending  Hx of Stroke/TIA 02/2021 admitted for left-sided weakness.  CT showed right frontal HT/SAH.  CTA head and neck unremarkable.  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  MRI showed right frontal infarct with hemorrhagic transformation.  Repeat MRI later showed new right occipital infarct with enlargement of posterior frontal infarct, stable HT and SAH.  CTA head and neck showed focal calcification within the right PCA distal P3 segment.  EF 45 to 50%, LDL 106, A1c is 11.1.  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  Discharged on aspirin  81 and Crestor . Outpatient 30-day cardiac event monitoring 05/2021 showed no A-fib. 04/2022 admitted for right MCA large infarct with hemorrhagic termination.  MRI showed left P2 severe stenosis.  EF 50 to 55%.  LDL 33, A1c 8.3.  Discharged on aspirin  and Topamax .  Loop recorder placed.  History of seizure August 2025 admitted for worsening left-sided weakness.  Found to have left facial twitching, left gaze and left arm jerking.  Received Versed  and loaded with Keppra .  EEG no seizure.  Strong Keppra  500 twice daily. Home meds: Keppra  500mg  BID, continued Chart review states patient not taking.  However, patient stated he was still taking his Keppra  at home  Hypertensive emergency Home meds:  Losartan  50mg  daily, Coreg  12.5mg  BID, Lasix  40 mg daily Stable now Off Cleviprex  On laxix, HCTZ, Cozaar  BP goal: BP <180/105 Long-term BP goal normotensive  Hyperlipidemia Home meds:  Crestor  40mg , resumed  in hospital LDL 101, goal < 70 Add Zetia  10 Continue statin and Zetia  at discharge  Diabetes type II Uncontrolled Home meds: Trulicity  weekly, NovoLog  AC HgbA1c 10.6, goal < 7.0 On Lantus  CBGs SSI Recommend close follow-up with PCP for better DM control  Other Stroke Risk Factors Obesity, Body mass index is 31.69 kg/m., BMI >/= 30 associated with increased stroke risk, recommend weight loss, diet and exercise as appropriate  Obstructive sleep apnea, not on CPAP at home  Other medical issues Anxiety Status post ACDF Hypothyroidism on Synthroid  Leukocytosis, resolved  WBC 10.6-6.3  Hospital day # 3  Patient seen and examined by NP/APP with MD. MD to update note as needed.   Jorene Last, DNP, FNP-BC [facility] Pager: [phone]     To contact Stroke Continuity provider, please refer to [email or web address]. After hours, contact General Neurology

## 2024-02-24 NOTE — Progress Notes (Signed)
 STROKE TEAM PROGRESS NOTE   INTERIM HISTORY/SUBJECTIVE Pt reclining in bed, no complains. BP stable. Had loop recorder interrogation today and no afib.  OBJECTIVE  CBC    Component Value Date/Time   WBC 6.5 02/24/2024 0348   RBC 4.42 02/24/2024 0348   HGB 13.8 02/24/2024 0348   HGB 12.7 (L) 09/30/2023 1629   HCT 41.4 02/24/2024 0348   HCT 39.9 09/30/2023 1629   PLT 174 02/24/2024 0348   PLT 162 09/30/2023 1629   MCV 93.7 02/24/2024 0348   MCV 100 (H) 09/30/2023 1629   MCH 31.2 02/24/2024 0348   MCHC 33.3 02/24/2024 0348   RDW 14.8 02/24/2024 0348   RDW 13.7 09/30/2023 1629   LYMPHSABS 0.8 02/21/2024 0320   LYMPHSABS 1.1 09/30/2023 1629   MONOABS 0.4 02/21/2024 0320   EOSABS 0.2 02/21/2024 0320   EOSABS 0.1 09/30/2023 1629   BASOSABS 0.0 02/21/2024 0320   BASOSABS 0.0 09/30/2023 1629    BMET    Component Value Date/Time   NA 139 02/24/2024 0348   NA 141 09/30/2023 1629   K 3.7 02/24/2024 0348   CL 98 02/24/2024 0348   CO2 36 (H) 02/24/2024 0348   GLUCOSE 198 (H) 02/24/2024 0348   BUN 23 02/24/2024 0348   BUN 21 09/30/2023 1629   CREATININE 1.05 02/24/2024 0348   CALCIUM  8.8 (L) 02/24/2024 0348   EGFR 96 09/30/2023 1629   GFRNONAA >60 02/24/2024 0348    IMAGING past 24 hours No results found.   Vitals:   02/23/24 2301 02/24/24 0440 02/24/24 0818 02/24/24 1129  BP:  (!) 147/72 (!) 147/70 137/66  Pulse:  (!) 53 66 65  Resp: 18 18 19 18   Temp:  98.2 F (36.8 C) 98.2 F (36.8 C) 98.2 F (36.8 C)  TempSrc:  Oral Oral Oral  SpO2:   93% 96%  Weight:      Height:       PHYSICAL EXAM General:  Alert, well-nourished, well-developed patient in no acute distress CV: SR/SB on monitor Respiratory:  Regular, unlabored respirations on room air  NEURO:  awake, alert, mildly lethargic, eyes open, orientated to age, place, time. No aphasia, fluent language, following all simple commands, slight dysarthria with lethargy. Able to name and repeat. No gaze palsy,  tracking bilaterally, left hemianopia which is chronic. No significant facial droop. Tongue midline. RUE 5/5, LUE flexion contracture in the elbow and fingers, proximal 3-/5, distally 2/5 finger movement. RLE 5/5, LLE 4+/5 proximal and 5/5 distally. Sensation symmetrical bilaterally, R FTN intact, gait not tested.   ASSESSMENT/PLAN  Mr. Reiss Mowrey is a 68 y.o. 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 CTH negative. Hypertensive and hyperglycemic in ED, admitted for further workup and ICU care.  NIH on Admission 11.  Stroke: left centrum semiovale infarct, etiology:  small vessel disease in patient with multiple uncontrolled risk factors Code Stroke CT head No acute intracranial abnormality. ASPECTS is 10. Old infarcts in the posterior right MCA territory and the right PCA territory.   CTA head & neck No large vessel occlusion. Severe left P2 PCA stenosis. MRI Small subacute infarct posterior left centrum semiovale. Extensive chronic ischemia with multiple old infarcts.  2D Echo: EF 50-55% EEG no seizure Loop Recorder Interrogation no afib LDL 101 HgbA1c 10.6 in 01/2024 UDS negative VTE prophylaxis - Heparin  SQ aspirin  81 mg daily prior to admission, continue aspirin   81 mg daily and clopidogrel  75 mg daily for 3 weeks and then Plavix   alone. Therapy recommendations: Home health PT Disposition:  pending  Hx of Stroke/TIA 02/2021 admitted for left-sided weakness.  CT showed right frontal HT/SAH.  CTA head and neck unremarkable.  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  MRI showed right frontal infarct with hemorrhagic transformation.  Repeat MRI later showed new right occipital infarct with enlargement of posterior frontal infarct, stable HT and SAH.  CTA head and neck showed focal calcification within the right PCA  distal P3 segment.  EF 45 to 50%, LDL 106, A1c is 11.1.  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  Discharged on aspirin  81 and Crestor . Outpatient 30-day cardiac event monitoring 05/2021 showed no A-fib. 04/2022 admitted for right MCA large infarct with hemorrhagic termination.  MRI showed left P2 severe stenosis.  EF 50 to 55%.  LDL 33, A1c 8.3.  Discharged on aspirin  and Topamax .  Loop recorder placed.  History of seizure August 2025 admitted for worsening left-sided weakness.  Found to have left facial twitching, left gaze and left arm jerking.  Received Versed  and loaded with Keppra .  EEG no seizure.  Strong Keppra  500 twice daily. Home meds: Keppra  500mg  BID, continued Chart review states patient not taking.  However, patient stated he was still taking his Keppra  at home  Hypertensive emergency Home meds:  Losartan  50mg  daily, Coreg  12.5mg  BID, Lasix  40 mg daily Stable now Off Cleviprex  On laxix, HCTZ, Cozaar  BP goal: BP <180/105 Long-term BP goal normotensive  Hyperlipidemia Home meds:  Crestor  40mg , resumed in hospital LDL 101, goal < 70 Add Zetia  10 Continue statin and Zetia  at discharge  Diabetes type II Uncontrolled Home meds: Trulicity  weekly, NovoLog  AC HgbA1c 10.6, goal < 7.0 On Lantus  CBGs SSI Recommend close follow-up with PCP for better DM control  Other Stroke Risk Factors Obesity, Body mass index is 31.69 kg/m., BMI >/= 30 associated with increased stroke risk, recommend weight loss, diet and exercise as appropriate  Obstructive sleep apnea, not on CPAP at home  Other medical issues Anxiety Status post ACDF Hypothyroidism on Synthroid  Leukocytosis, resolved  WBC 10.6-6.3  Hospital day # 3  Neurology will sign off. Please call with questions. Pt will follow up with stroke clinic NP at [facility] in about 4 weeks. Thanks for the consult.   Ary Cummins, MD PhD Stroke Neurology 02/24/2024 12:38 PM    To contact Stroke Continuity provider, please refer  to [email or web address]. After hours, contact General Neurology

## 2024-02-24 NOTE — Assessment & Plan Note (Signed)
 TSH initially elevated to 11.215.  Likely in setting of acute illness. - Continue home Synthroid  100 mcg daily - Repeat TSH outpatient in 1 month

## 2024-02-25 ENCOUNTER — Other Ambulatory Visit (HOSPITAL_COMMUNITY): Payer: Self-pay

## 2024-02-25 DIAGNOSIS — E039 Hypothyroidism, unspecified: Secondary | ICD-10-CM

## 2024-02-25 LAB — CBC
HCT: 42 % (ref 39.0–52.0)
Hemoglobin: 14.3 g/dL (ref 13.0–17.0)
MCH: 31.6 pg (ref 26.0–34.0)
MCHC: 34 g/dL (ref 30.0–36.0)
MCV: 92.9 fL (ref 80.0–100.0)
Platelets: 195 K/uL (ref 150–400)
RBC: 4.52 MIL/uL (ref 4.22–5.81)
RDW: 14.7 % (ref 11.5–15.5)
WBC: 7.2 K/uL (ref 4.0–10.5)
nRBC: 0 % (ref 0.0–0.2)

## 2024-02-25 LAB — BASIC METABOLIC PANEL WITH GFR
Anion gap: 8 (ref 5–15)
BUN: 25 mg/dL — ABNORMAL HIGH (ref 8–23)
CO2: 30 mmol/L (ref 22–32)
Calcium: 8.4 mg/dL — ABNORMAL LOW (ref 8.9–10.3)
Chloride: 100 mmol/L (ref 98–111)
Creatinine, Ser: 1.07 mg/dL (ref 0.61–1.24)
GFR, Estimated: >60 mL/min (ref >60)
Glucose, Bld: 117 mg/dL — ABNORMAL HIGH (ref 70–99)
Potassium: 3.5 mmol/L (ref 3.5–5.1)
Sodium: 138 mmol/L (ref 135–145)

## 2024-02-25 LAB — MAGNESIUM: Magnesium: 2 mg/dL (ref 1.7–2.4)

## 2024-02-25 LAB — GLUCOSE, CAPILLARY
Glucose-Capillary: 114 mg/dL — ABNORMAL HIGH (ref 70–99)
Glucose-Capillary: 169 mg/dL — ABNORMAL HIGH (ref 70–99)
Glucose-Capillary: 210 mg/dL — ABNORMAL HIGH (ref 70–99)

## 2024-02-25 MED ORDER — POLYETHYLENE GLYCOL 3350 17 G PO PACK: 17.0000 g | PACK | Freq: Every day | ORAL | Status: AC | PRN

## 2024-02-25 MED ORDER — FUROSEMIDE 40 MG PO TABS
40.0000 mg | ORAL_TABLET | Freq: Every day | ORAL | 11 refills | Status: AC
  Filled 2024-02-25: qty 30, 30d supply, fill #0

## 2024-02-25 MED ORDER — INSULIN LISPRO (1 UNIT DIAL) 100 UNIT/ML (KWIKPEN)
5.0000 [IU] | PEN_INJECTOR | Freq: Three times a day (TID) | SUBCUTANEOUS | 0 refills | Status: DC
  Filled 2024-02-25: qty 6, 40d supply, fill #0

## 2024-02-25 MED ORDER — CLOPIDOGREL BISULFATE 75 MG PO TABS
75.0000 mg | ORAL_TABLET | Freq: Every day | ORAL | 0 refills | Status: AC
  Filled 2024-02-25: qty 60, 60d supply, fill #0

## 2024-02-25 MED ORDER — INSULIN PEN NEEDLE 32G X 4 MM MISC
1.0000 | 0 refills | Status: AC
  Filled 2024-02-25: qty 100, 25d supply, fill #0

## 2024-02-25 MED ORDER — LANCETS MISC
1.0000 | 0 refills | Status: AC
  Filled 2024-02-25: qty 100, 25d supply, fill #0

## 2024-02-25 MED ORDER — LANCET DEVICE MISC
1.0000 | 0 refills | Status: AC
  Filled 2024-02-25: qty 1, fill #0

## 2024-02-25 MED ORDER — EZETIMIBE 10 MG PO TABS
10.0000 mg | ORAL_TABLET | Freq: Every day | ORAL | 0 refills | Status: AC
  Filled 2024-02-25: qty 30, 30d supply, fill #0

## 2024-02-25 MED ORDER — POTASSIUM CHLORIDE CRYS ER 20 MEQ PO TBCR
20.0000 meq | EXTENDED_RELEASE_TABLET | Freq: Every day | ORAL | 0 refills | Status: AC
  Filled 2024-02-25: qty 30, 30d supply, fill #0

## 2024-02-25 MED ORDER — POTASSIUM CHLORIDE CRYS ER 20 MEQ PO TBCR
40.0000 meq | EXTENDED_RELEASE_TABLET | Freq: Once | ORAL | Status: AC
  Administered 2024-02-25: 40 meq via ORAL
  Filled 2024-02-25: qty 2

## 2024-02-25 MED ORDER — NOVOLOG FLEXPEN 100 UNIT/ML ~~LOC~~ SOPN
5.0000 [IU] | PEN_INJECTOR | Freq: Three times a day (TID) | SUBCUTANEOUS | 1 refills | Status: AC
  Filled 2024-02-25: qty 6, 40d supply, fill #0

## 2024-02-25 MED ORDER — ASPIRIN 81 MG PO TBEC
81.0000 mg | DELAYED_RELEASE_TABLET | Freq: Every day | ORAL | 0 refills | Status: AC
  Filled 2024-02-25: qty 16, 16d supply, fill #0

## 2024-02-25 MED ORDER — INSULIN GLARGINE 100 UNIT/ML SOLOSTAR PEN
24.0000 [IU] | PEN_INJECTOR | Freq: Two times a day (BID) | SUBCUTANEOUS | 0 refills | Status: AC
  Filled 2024-02-25: qty 15, 31d supply, fill #0

## 2024-02-25 MED ORDER — CALCIUM CARBONATE ANTACID 500 MG PO CHEW: 2.0000 | CHEWABLE_TABLET | Freq: Three times a day (TID) | ORAL | Status: AC | PRN

## 2024-02-25 MED ORDER — CALCIUM CARBONATE ANTACID 500 MG PO CHEW
2.0000 | CHEWABLE_TABLET | Freq: Three times a day (TID) | ORAL | Status: DC | PRN
  Administered 2024-02-25: 400 mg via ORAL
  Filled 2024-02-25: qty 2

## 2024-02-25 MED ORDER — SPIRONOLACTONE 25 MG PO TABS
25.0000 mg | ORAL_TABLET | Freq: Every day | ORAL | 0 refills | Status: AC
  Filled 2024-02-25: qty 30, 30d supply, fill #0

## 2024-02-25 MED ORDER — BLOOD GLUCOSE TEST VI STRP
1.0000 | ORAL_STRIP | 0 refills | Status: AC
  Filled 2024-02-25: qty 100, 25d supply, fill #0

## 2024-02-25 MED ORDER — BLOOD GLUCOSE MONITOR SYSTEM W/DEVICE KIT
1.0000 | PACK | 0 refills | Status: AC
  Filled 2024-02-25: qty 1, 1d supply, fill #0

## 2024-02-25 NOTE — Discharge Summary (Addendum)
 Family Medicine Teaching [facility] Discharge Summary  Patient name: Joseph Ortega Medical record number: [id number] Date of birth: 03/10/1956 Age: 68 y.o. Gender: male Date of Admission: 02/21/2024  Date of Discharge: 02/25/2024 Admitting Physician: Toribio JAYSON Sharps, MD  Primary Care Provider: Howell Lunger, DO Consultants: PCCM, neurology  Indication for Hospitalization: Hypertensive emergency  Discharge Diagnoses/Problem List: Hypertensive emergency Principal Problem for Admission:  Other Problems addressed during stay:  Principal Problem:   CVA (cerebral vascular accident) [facility]) Active Problems:   Hypertension   Hypothyroidism   T2DM (type 2 diabetes mellitus) (HCC)   CHF exacerbation (HCC)   Seizure disorder [facility])   Brief Hospital Course:  Joseph Ortega is a 68 y.o.male with a history of prior right MCA stroke with left deficits who was transferred to the family medicine teaching Service at [facility] from the ICU for strokelike symptoms and seizure in the ED. His hospital course is detailed below:  Acute on Chronic L-sided Weakness from prior Stroke  Uncontrolled HTN  Breakthrough Seizure   Patient was brought into the emergency room for strokelike symptoms including left-sided weakness.  Last known normal baseline was night of 12/4.  In the ED, his left extremity seems to be at baseline.  Patient was also found to have O2 sat of 88% on room air which improved with O2.  CT head was done which showed evidence of prior right MCA stroke.  Neurology was consulted who did not recommend further workup.  While in the ED, patient became tachypneic, hypoxic and hypertensive and then had witnessed seizure with unresponsiveness to painful stimuli.  Critical care was consulted and patient was loaded with 2g Keppra  and admitted to the ICU.   Neurology was consulted who recommended continuing loading 300 mg of Plavix  and ASA 81 daily for 3 weeks and then 75 mg Plavix  alone.SABRA MRI was  completed which showed small subacute infarct and extensive chronic ischemia with multiple old infarcts.  CTA head and neck was ordered which showed severe left P2 PCA stenosis.  Patient reported nonadherence to outpatient Keppra  500 mg twice daily.  Echo showed EF of 50 to 55% with grade 1 diastolic dysfunction and elevated LA pressure and a small pericardial effusion. EEG ordered which showed cortical dysfunction arising from right hemisphere secondary to underlying structural abnormality/encephalomalacia.  Hypertensive emergency Patient found to have severe symptomatic hypertension with blood pressures of 210s/110s in the ED.  Patient was started on Cleviprex  drip in the ED and then transition to home losartan -HCTZ.  Patient's Coreg  was held in setting of potential heart failure exacerbation and sinus bradycardia. Spironolactone  was added due to persistent mild HTN and blood pressures normalized.  Acute hypoxic respiratory failure HFrEF exacerbation Patient was requiring up to 6 L of nasal cannula in the ICU and appeared volume overloaded.  Suspected to have exacerbation of HFrEF.  Patient was diuresed with Lasix  and Coreg  was held in setting of heart failure exacerbation.  After diuresis, O2 was able to be weaned to room air and patient returned to euvolemia.  Home p.o. Lasix  regimen was resumed.  Other chronic conditions were medically managed with home medications and formulary alternatives as necessary (DMII, hypothyroidism)  PCP Follow-up Recommendations: TSH elevated w/ normal FT4. Repeat TSH outpt in 4 weeks.  Outpatient follow-up with cardiology for sinus bradycardia Resume Coreg  if HR improves Consider adding Jardiance  for GDMT     Results/Tests Pending at Time of Discharge:  Unresulted Labs (From admission, onward)     Start  Ordered   02/22/24 0500  Basic metabolic panel with GFR  Daily,   R      02/21/24 0608   02/22/24 0500  CBC  Daily,   R      02/21/24 0608   02/22/24  0500  Magnesium   Daily,   R      02/21/24 0608             Disposition: Home w HHOT/PT  Discharge Condition: Stable  Discharge Exam:  Vitals:   02/25/24 1116 02/25/24 1551  BP: 133/61 132/73  Pulse: 66 68  Resp: 16 17  Temp: 98.3 F (36.8 C) 98.8 F (37.1 C)  SpO2: 94% 95%   General: Resting comfortably in no acute distress  cardiovascular: Regular rate rhythm, normal S1 and S2, no murmurs rubs or gallops Respiratory: Clear to auscultation bilaterally, no wheezes or crackles Abdomen: Active bowel sounds, nondistended, nontender Extremities: Nonedematous, nontender bilateral lower extremities  Significant Procedures: None  Significant Labs and Imaging:  Recent Labs  Lab 02/24/24 0348 02/25/24 0232  WBC 6.5 7.2  HGB 13.8 14.3  HCT 41.4 42.0  PLT 174 195   Recent Labs  Lab 02/24/24 0348 02/25/24 0232  NA 139 138  K 3.7 3.5  CL 98 100  CO2 36* 30  GLUCOSE 198* 117*  BUN 23 25*  CREATININE 1.05 1.07  CALCIUM  8.8* 8.4*  MG 1.8 2.0    CT head without contrast 02/21/24 IMPRESSION: 1. No acute intracranial abnormality. ASPECTS is 10. 2. Old infarcts in the posterior right MCA territory and the right PCA territory.  CXR 02/21/2024 IMPRESSION: 1. Mild pulmonary edema with diffuse interstitial opacities, compatible with congestive changes. 2. Borderline cardiomegaly. 3. Elevated right hemidiaphragm.  MRI brain without contrast 02/21/2024 IMPRESSION: 1. Small subacute infarct in the posterior left centrum semiovale. 2. Extensive chronic ischemia with multiple old infarcts as above. 3. Long-standing right parotid mass.  CT angio head neck 02/22/2024 IMPRESSION: 1. No large vessel occlusion. 2. Severe left P2 PCA stenosis.  Discharge Medications:  Allergies as of 02/25/2024   No Known Allergies      Medication List     PAUSE taking these medications    carvedilol  12.5 MG tablet Wait to take this until your doctor or other care provider tells  you to start again. Commonly known as: COREG  Take 1 tablet (12.5 mg total) by mouth 2 (two) times daily with a meal.   potassium chloride  SA 20 MEQ tablet Wait to take this until your doctor or other care provider tells you to start again. Commonly known as: Klor-Con  M20 Take 1 tablet (20 mEq total) by mouth daily.       STOP taking these medications    docusate sodium  100 MG capsule Commonly known as: COLACE   DULoxetine  HCl 40 MG Cpep   guanFACINE 2 MG Tb24 ER tablet Commonly known as: INTUNIV   traZODone  100 MG tablet Commonly known as: DESYREL        TAKE these medications    Accu-Chek Guide Test test strip Generic drug: glucose blood Use up to four times daily as directed. Dispense based on patient and insurance preference. (FOR ICD-10 E10.9, E11.9).   Accu-Chek Guide w/Device Kit Dispense based on patient and insurance preference. Use up to four times daily as directed. (FOR ICD-10 E10.9, E11.9).   Accu-Chek Softclix Lancets lancets Use 1 each up to four times daily as directed. Dispense based on patient and insurance preference. (FOR ICD-10 E10.9, E11.9).   acetaminophen   325 MG tablet Commonly known as: TYLENOL  Take 1-2 tablets (325-650 mg total) by mouth every 4 (four) hours as needed for mild pain (pain score 1-3).   Alive Mens Gummy Multivitamins Chew Chew 3 tablets by mouth daily.   ascorbic acid  500 MG tablet Commonly known as: VITAMIN C  Take 1 tablet (500 mg total) by mouth daily with supper.   aspirin  EC 81 MG tablet Take 1 tablet (81 mg total) by mouth daily for 16 days. Swallow whole.   busPIRone  10 MG tablet Commonly known as: BUSPAR  Take 10 mg by mouth 3 (three) times daily.   calcium  carbonate 500 MG chewable tablet Commonly known as: TUMS - dosed in mg elemental calcium  Chew 2 tablets (400 mg of elemental calcium  total) by mouth 3 (three) times daily as needed for indigestion or heartburn.   clopidogrel  75 MG tablet Commonly known as:  PLAVIX  Take 1 tablet (75 mg total) by mouth daily. Start taking on: February 26, 2024   Embecta Pen Needle Nano 32G X 4 MM Misc Generic drug: Insulin  Pen Needle Dispense based on patient and insurance preference. Use up to four times daily as directed. (FOR ICD-10 E10.9, E11.9).   ezetimibe  10 MG tablet Commonly known as: ZETIA  Take 1 tablet (10 mg total) by mouth daily. Start taking on: February 26, 2024   furosemide  40 MG tablet Commonly known as: Lasix  Take 1 tablet (40 mg total) by mouth daily.   gabapentin  300 MG capsule Commonly known as: NEURONTIN  TAKE 1 CAPSULE BY MOUTH THREE TIMES A DAY   Lancet Device Misc 1 each by Does not apply route as directed. Dispense based on patient and insurance preference. Use up to four times daily as directed. (FOR ICD-10 E10.9, E11.9).   Lantus  SoloStar 100 UNIT/ML Solostar Pen Generic drug: insulin  glargine Inject 24 Units into the skin 2 (two) times daily. May substitute as needed per insurance. What changed:  how much to take when to take this additional instructions   levETIRAcetam  500 MG tablet Commonly known as: KEPPRA  Take 1 tablet (500 mg total) by mouth 2 (two) times daily.   levothyroxine  100 MCG tablet Commonly known as: SYNTHROID  Take 1 tablet (100 mcg total) by mouth daily at 6 (six) AM.   losartan -hydrochlorothiazide  50-12.5 MG tablet Commonly known as: Hyzaar Take 1 tablet by mouth daily.   NovoLOG  FlexPen 100 UNIT/ML FlexPen Generic drug: insulin  aspart Inject 5 Units into the skin 3 (three) times daily with meals. What changed:  how much to take additional instructions   polyethylene glycol 17 g packet Commonly known as: MIRALAX  / GLYCOLAX  Take 17 g by mouth daily as needed for moderate constipation.   rosuvastatin  40 MG tablet Commonly known as: CRESTOR  Take 1 tablet (40 mg total) by mouth daily.   spironolactone  25 MG tablet Commonly known as: ALDACTONE  Take 1 tablet (25 mg total) by mouth  daily. Start taking on: February 26, 2024   Trulicity  0.75 MG/0.5ML Soaj Generic drug: Dulaglutide  Inject 0.75 mg into the skin once a week. What changed: when to take this               Durable Medical Equipment  (From admission, onward)           Start     Ordered   02/24/24 1328  For home use only DME Bedside commode  Once       Question:  Patient needs a bedside commode to treat with the following condition  Answer:  Weakness  02/24/24 1327            Discharge Instructions: Please refer to Patient Instructions section of EMR for full details.  Patient was counseled important signs and symptoms that should prompt return to medical care, changes in medications, dietary instructions, activity restrictions, and follow up appointments.   Follow-Up Appointments:  Follow-up Information     [facility]. Schedule an appointment as soon as possible for a visit in 1 month(s).   Specialty: Neurology Why: stroke clinic Contact information: [address] [place]  [address] [phone]        [facility]. Schedule an appointment as soon as possible for a visit.   Contact information: [phone]                Lorrane Pac, MD 02/25/2024, 4:47 PM PGY-1, [facility]

## 2024-02-25 NOTE — Progress Notes (Signed)
 Occupational Therapy Treatment Patient Details Name: Joseph Ortega MRN: [id number] DOB: Nov 28, 1955 Today's Date: 02/25/2024   History of present illness 68 yo M adm 12/5 L side weakness and erratic behavior. 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   OT comments  Pt progressing toward established OT goals. Challenging balance, activity tolerance, safety awareness this session with with standing BADL. Pt needing CGA for balance and fatiguing quickly needing to return to EOB after standing ADL. Pt continues to present with decreased activity tolerance, balance, and strength. Continues to decline any need for inpatient rehab. Pt to continue to benefit from acute OT services. Recommending discharge home with HHOT and support from family.        If plan is discharge home, recommend the following:  A little help with walking and/or transfers;A little help with bathing/dressing/bathroom;Assistance with cooking/housework;Assist for transportation;Help with stairs or ramp for entrance;Direct supervision/assist for financial management   Equipment Recommendations  BSC/3in1    Recommendations for Other Services      Precautions / Restrictions Precautions Precautions: Fall Recall of Precautions/Restrictions: Impaired Restrictions Weight Bearing Restrictions Per Provider Order: No       Mobility Bed Mobility Overal bed mobility: Needs Assistance Bed Mobility: Supine to Sit, Sit to Supine     Supine to sit: HOB elevated, Used rails, Supervision Sit to supine: HOB elevated, Used rails, Modified independent (Device/Increase time)   General bed mobility comments: pt uses rails to exit bed to his rt (at home he goes to his left)    Transfers Overall transfer level: Needs  assistance Equipment used: Rolling walker (2 wheels) Transfers: Sit to/from Stand Sit to Stand: From elevated surface, Contact guard assist           General transfer comment: from elevated bed (simulating home) and from [facility]     Balance Overall balance assessment: Mild deficits observed, not formally tested                                         ADL either performed or assessed with clinical judgement   ADL Overall ADL's : Needs assistance/impaired     Grooming: Contact guard assist;Standing Grooming Details (indicate cue type and reason): decr safetly awareness, poor orientation to sink, LUE supported on RW, but reports this is how he does it at home                 Toilet Transfer: Contact guard assist;Ambulation;Rolling walker (2 wheels)           Functional mobility during ADLs: Rolling walker (2 wheels);Contact guard assist      Extremity/Trunk Assessment Upper Extremity Assessment Upper Extremity Assessment: LUE deficits/detail LUE Deficits / Details: chronic L hemi, pt reports more tightness in digits/hands than baseline, typically able to use as a helper hand. thumb with flexion contracture but abler to graspn RW without assist   Lower Extremity Assessment Lower Extremity Assessment: Defer to PT evaluation        Vision   Vision Assessment?: Wears glasses for reading Additional Comments: L field cut at baseline   Perception     Praxis     Communication Communication Communication: No apparent difficulties   Cognition Arousal: Alert Behavior During  Therapy: WFL for tasks assessed/performed Cognition: No family/caregiver present to determine baseline, Cognition impaired       Memory impairment (select all impairments): Short-term memory                       Following commands: Impaired Following commands impaired: Follows one step commands with increased time, Follows multi-step commands inconsistently       Cueing   Cueing Techniques: Verbal cues, Gestural cues, Tactile cues  Exercises      Shoulder Instructions       General Comments      Pertinent Vitals/ Pain       Pain Assessment Pain Assessment: No/denies pain  Home Living Family/patient expects to be discharged to:: Private residence Living Arrangements: Spouse/significant other                                      Prior Functioning/Environment              Frequency  Min 2X/week        Progress Toward Goals  OT Goals(current goals can now be found in the care plan section)  Progress towards OT goals: Progressing toward goals  Acute Rehab OT Goals OT Goal Formulation: With patient Time For Goal Achievement: 03/09/24 Potential to Achieve Goals: Good  Plan      Co-evaluation                 AM-PAC OT 6 Clicks Daily Activity     Outcome Measure   Help from another person eating meals?: None Help from another person taking care of personal grooming?: A Little Help from another person toileting, which includes using toliet, bedpan, or urinal?: A Little Help from another person bathing (including washing, rinsing, drying)?: A Little Help from another person to put on and taking off regular upper body clothing?: A Little   6 Click Score: 16    End of Session Equipment Utilized During Treatment: Gait belt;Rolling walker (2 wheels)  OT Visit Diagnosis: Unsteadiness on feet (R26.81);Muscle weakness (generalized) (M62.81);History of falling (Z91.81);Low vision, both eyes (H54.2);Other symptoms and signs involving cognitive function;Hemiplegia and hemiparesis Hemiplegia - Right/Left: Left Hemiplegia - caused by: Cerebral infarction   Activity Tolerance Patient tolerated treatment well   Patient Left in bed;with call bell/phone within reach;with bed alarm set   Nurse Communication Mobility status        Time: 8853-8777 OT Time Calculation (min): 36 min  Charges: OT General  Charges $OT Visit: 1 Visit OT Treatments $Self Care/Home Management : 23-37 mins  Joseph Ortega FREDERICK, OTR/L [facility] Acute Rehabilitation Office: [phone]   Joseph Ortega 02/25/2024, 12:38 PM

## 2024-02-25 NOTE — TOC Transition Note (Signed)
 Transition of Care [facility]) - Discharge Note   Patient Details  Name: Joseph Ortega MRN: [id number] Date of Birth: 1955-07-05  Transition of Care [facility]) CM/SW Contact:  Andrez JULIANNA George, RN Phone Number: 02/25/2024, 1:16 PM   Clinical Narrative:     Pt is discharging home. Pt requesting PTAR home. Address verified.  Pt is moving to [place]  this week. [facility] will see him in clinic prior to his leaving town. Appt on AVS.  Home health referral sent to [facility] in [place] . Pt also provided the orders for [facility] in case he needs them.  Pt asked for the orders for [facility] and not the [facility] so he can get this filled in [place] . CM provided him the orders.  Pt is planning on getting set up with his dads PCP once in [place] .   Final next level of care: Home w Home Health Services Barriers to Discharge: No Barriers Identified   Patient Goals and CMS Choice   CMS Medicare.gov Compare Post Acute Care list provided to:: Patient Choice offered to / list presented to : Patient      Discharge Placement                       Discharge Plan and Services Additional resources added to the After Visit Summary for                  DME Arranged: Bedside commode       Representative spoke with at DME Agency: orders provided to the patient HH Arranged: PT, OT, Speech Therapy       Representative spoke with at [facility] Agency: orders provided to the patient.  Social Drivers of Health (SDOH) Interventions SDOH Screenings   Food Insecurity: No Food Insecurity (11/01/2023)  Housing: Low Risk  (11/01/2023)  Transportation Needs: Unmet Transportation Needs (11/01/2023)  Utilities: Not At Risk (11/01/2023)  Alcohol Screen: Low Risk  (09/19/2023)  Depression (PHQ2-9): Low Risk  (04/24/2023)  Financial Resource Strain: Low Risk  (09/19/2023)  Social Connections: Moderately Isolated (11/01/2023)  Tobacco Use: Low Risk  (02/21/2024)     Readmission Risk Interventions    09/16/2023   10:59 AM  Readmission Risk  Prevention Plan  Post Dischage Appt Complete  Medication Screening Complete  Transportation Screening Complete

## 2024-02-25 NOTE — Discharge Instructions (Addendum)
 Dear Joseph Ortega,  Thank you for letting us  participate in your care. You were hospitalized for hypertensive emergency and diagnosed with CVA (cerebral vascular accident) (HCC). You were treated with IV blood pressure medications, and your oral blood pressure regiment was adjusted.   POST-HOSPITAL & CARE INSTRUCTIONS Continue taking the Hyzaar medication and spironolactone  Keep taking both the Plavix  and the aspirin  until 03/12/24, after which you can stop taking the Aspirin . Follow up with a new PCP in [place]  as soon as you are able. Discuss starting jardiance  with them. Go to your follow up appointments (listed below)   DOCTOR'S APPOINTMENT   Future Appointments  Date Time Provider Department Center  02/27/2024  9:50 AM Pruett, Milda CROME, MD [facility]  03/26/2024  7:10 AM CVD HVT DEVICE REMOTES [facility] H&V    Follow-up Information     [facility]. Schedule an appointment as soon as possible for a visit in 1 month(s).   Specialty: Neurology Why: stroke clinic Contact information: [address] [place]  [address] [phone]        Howell Lunger, DO. Schedule an appointment as soon as possible for a visit in 1 week(s).   Specialty: Family Medicine Contact information: [address] [place] [address] [phone]                 Take care and be well!  Family Medicine Teaching Service Inpatient Team [place]  [facility]  [address] [place] [address] [place], [place] [address] [phone]

## 2024-02-27 ENCOUNTER — Inpatient Hospital Stay

## 2024-02-27 ENCOUNTER — Telehealth: Payer: Self-pay | Admitting: Pharmacist

## 2024-02-27 NOTE — Telephone Encounter (Signed)
 Attempted to contact patient for follow-up missed appointment with Dr. Orie   Left HIPAA compliant voice mail requesting call back to reschedule phone: [phone] provided I am happy to have visit if referred again - please attempt to schedule while patient is in the room.    Total time with patient call and documentation of interaction: 4 minutes.  Follow-up phone call planned: None

## 2024-03-02 ENCOUNTER — Ambulatory Visit: Admitting: Student

## 2024-03-02 ENCOUNTER — Encounter: Payer: Self-pay | Admitting: Student

## 2024-03-02 VITALS — BP 175/81 | HR 87 | Ht 72.0 in | Wt 239.0 lb

## 2024-03-02 DIAGNOSIS — E039 Hypothyroidism, unspecified: Secondary | ICD-10-CM

## 2024-03-02 DIAGNOSIS — E119 Type 2 diabetes mellitus without complications: Secondary | ICD-10-CM

## 2024-03-02 DIAGNOSIS — I10 Essential (primary) hypertension: Secondary | ICD-10-CM

## 2024-03-02 DIAGNOSIS — I63411 Cerebral infarction due to embolism of right middle cerebral artery: Secondary | ICD-10-CM

## 2024-03-02 DIAGNOSIS — I5022 Chronic systolic (congestive) heart failure: Secondary | ICD-10-CM

## 2024-03-02 NOTE — Assessment & Plan Note (Signed)
-   Continue Lantus  40 units nightly, Trulicity  weekly - Repeat A1c due in January

## 2024-03-02 NOTE — Assessment & Plan Note (Signed)
-   Stable chronic left-sided deficits - Continue Plavix /aspirin  for 3 weeks, transition to aspirin  alone on January 1 (simplified for patient)

## 2024-03-02 NOTE — Patient Instructions (Signed)
 It was great to see you! Thank you for allowing me to participate in your care!   I recommend that you always bring your medications to each appointment as this makes it easy to ensure we are on the correct medications and helps us  not miss when refills are needed.  Our plans for today:  - We will continue to refill your medications for up to 30 days after you leave the state, please do not hesitate to reach out to us  if you need assistance during this time. - When you establish care in [place] , you will need the following:  -TSH recheck (you have uncontrolled hypothyroidism), continue taking your Synthroid  -A1c recheck (last A1c of 10.6), continue taking your 40 units of Lantus .  Continue taking your Trulicity  weekly.  -Recommend metabolic panel to check electrolytes given your recently started spironolactone   -Please establish care with cardiology for your chronic congestive heart failure -Please take clopidogrel  and aspirin  daily, then starting January 1 please stop taking aspirin  and take 75 mg of clopidogrel  alone - Please establish care with neurology for your seizure disorder/stroke history. Continue taking 500 mg of Keppra  twice daily.  Take care and seek immediate care sooner if you develop any concerns. Please remember to show up 15 minutes before your scheduled appointment time!  Gladis Church, DO [facility]

## 2024-03-02 NOTE — Progress Notes (Signed)
 SUBJECTIVE:   CHIEF COMPLAINT / HPI:   Discussed the use of AI scribe software for clinical note transcription with the patient, who gave verbal consent to proceed.   Lantus  dose at home is 24 units twice daily per inpatient team, however he reports taking 40 units of Lantus  at night.  He reports no side effects or issues with this dosing.  History of Present Illness Joseph Ortega is a 68 year old male who presents for follow-up after a recent hospital admission for hypertensive emergency.  He was planning to move to [place] , but was hospitalized today he was supposed to move.  Hypertensive emergency Seizure Acute Hypoxic Resp Failure - Hospitalized on December 5th, 2025 for hypertensive emergency with stroke-like symptoms including left-sided weakness. 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 - Experienced a witnessed seizure during ED [place], accompanied by tachypnea, hypoxia, and unresponsiveness. Treated with levetiracetam  (Keppra ). -Initially admitted to ICU, started on Cleviprex  drip.  Transitioned back to home losartan -hydrochlorothiazide  and blood pressure normalized after the addition of spironolactone . - Coreg  held due to possible heart failure exacerbation and sinus bradycardia - Treated with levetiracetam  (Keppra ). - Discharged with instructions to take aspirin  and Plavix  for three weeks, then continue Plavix  75 mg daily alone.  Diabetes mellitus management -He was discharged home on 24 units of Lantus  twice daily, however he is currently taking 40 units of glargine nightly and weekly Trulicity . - Novolog  discontinued in favor of long-acting insulin  only. - Reports taking Trulicity    OBJECTIVE:   BP (!) 175/78   Pulse 87   Ht 6' (1.829 m)   Wt 239 lb (108.4 kg)   SpO2 99%   BMI 32.41 kg/m    General: NAD, pleasant Cardio: RRR, no MRG.  Respiratory:  CTAB, normal wob on RA Skin: Warm and dry Neuro: Chronic left-sided weakness appreciated, no overt neurologic deficits.  Alert and oriented x 4  ASSESSMENT/PLAN:   Assessment & Plan Type 2 diabetes mellitus without complication, with long-term current use of insulin  (HCC) - Continue Lantus  40 units nightly, Trulicity  weekly - Repeat A1c due in January Essential hypertension -Continue spironolactone , losartan -hydrochlorothiazide  daily Chronic heart failure with mildly reduced ejection fraction (HFmrEF, 41-49%) (HCC) - Restart carvedilol  12.5 mg twice daily - Continue Lasix  40 mg daily Cerebrovascular accident (CVA) due to embolism of right middle cerebral artery (HCC) - Stable chronic left-sided deficits - Continue Plavix /aspirin  for 3 weeks, transition to aspirin  alone on January 1 (simplified for patient) Hypothyroidism, unspecified type -TSH in 4 weeks - Continue Synthroid     Ultimately, I am concerned for this patient's ability to care for himself.  On multiple occasions, I have asked him to bring his spouse-which he does not.  Spouse is primarily in charge of taking care of his medications.  He has known history of nonadherence to medications, and he is certainly a poor historian.  I think he would benefit from personal care services, which we discussed before-but because he is moving back to [place]  (this week), this has been put on hold.  I provided a brief handout for him to bring to his next provider for important lab work needs to be done/quick reference given his complicated medical history.  I believe that he is at high risk of readmission, which may delay his moved to [place] .  If he is able to move to [place] , I did let him know that I  would provide refills for up to 30 days after his move-into let us  know if he needs anything in the meantime.  Gladis Church, DO [facility]

## 2024-03-02 NOTE — Assessment & Plan Note (Signed)
-  TSH in 4 weeks - Continue Synthroid

## 2024-03-03 ENCOUNTER — Other Ambulatory Visit: Payer: Self-pay | Admitting: Student

## 2024-03-25 ENCOUNTER — Other Ambulatory Visit: Payer: Self-pay | Admitting: Student

## 2024-03-26 ENCOUNTER — Ambulatory Visit

## 2024-03-27 ENCOUNTER — Other Ambulatory Visit: Payer: Self-pay | Admitting: Student

## 2024-03-27 DIAGNOSIS — E119 Type 2 diabetes mellitus without complications: Secondary | ICD-10-CM

## 2024-04-11 ENCOUNTER — Ambulatory Visit: Attending: Student

## 2024-04-15 ENCOUNTER — Telehealth: Payer: Self-pay | Admitting: Cardiovascular Disease

## 2024-04-15 NOTE — Telephone Encounter (Signed)
 Patient was called for missed transmission.

## 2024-04-16 ENCOUNTER — Telehealth: Payer: Self-pay

## 2024-04-16 ENCOUNTER — Encounter: Payer: Self-pay | Admitting: Emergency Medicine

## 2024-04-16 NOTE — Telephone Encounter (Signed)
-----   Message from Sawyer H sent at 04/16/2024  2:34 PM EST ----- Regarding: RE: Overdue Call x4; lvmtcb to schedule patient for overdue f/u w/ EP APP since patient hasn't transmitted since May.  Certified letter mailed. ----- Message ----- From: Gershon Alan BROCKS, RN Sent: 03/23/2024   5:03 PM EST To: Charlott CHRISTELLA Hurst Subject: RE: Overdue                                    Yes,   He has not transmitted since May.   We need to see him in clinic, discuss his remote monitoring and make a plan moving forward for follow up.   Thanks !  Mandy ----- Message ----- From: Hurst Charlott CHRISTELLA Sent: 03/23/2024   4:01 PM EST To: [facility] Device Subject: RE: Overdue                                    Call x3; spoke with patient to schedule appt with EP APP for overdue follow up, patient said he will callbck to schedule after looking at his schedule.   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 Just want to confirm a follow up is needed.   Cassie ----- Message ----- From: Prentiss Almarie DASEN, RN Sent: 01/31/2024   1:29 PM EST To: Cvd-Ep Scheduling Subject: Overdue                                        Overdue and needs apt.   Leigh

## 2024-05-12 ENCOUNTER — Ambulatory Visit

## 2024-06-12 ENCOUNTER — Ambulatory Visit

## 2024-07-13 ENCOUNTER — Ambulatory Visit

## 2024-08-13 ENCOUNTER — Ambulatory Visit

## 2024-09-13 ENCOUNTER — Ambulatory Visit

## 2024-10-14 ENCOUNTER — Ambulatory Visit

## 2024-11-14 ENCOUNTER — Ambulatory Visit

## 2024-12-15 ENCOUNTER — Ambulatory Visit

## 2025-01-15 ENCOUNTER — Ambulatory Visit

## 2025-02-15 ENCOUNTER — Ambulatory Visit

## 2025-03-18 ENCOUNTER — Ambulatory Visit

## 2025-04-18 ENCOUNTER — Ambulatory Visit
